# Patient Record
Sex: Male | Born: 1940 | Race: Black or African American | Hispanic: No | Marital: Married | Smoking: Former smoker
Health system: Southern US, Community
[De-identification: ages and names within clinical notes are randomized; demographics above are authoritative.]

## PROBLEM LIST (undated history)

## (undated) DIAGNOSIS — J302 Other seasonal allergic rhinitis: Secondary | ICD-10-CM

## (undated) DIAGNOSIS — E119 Type 2 diabetes mellitus without complications: Secondary | ICD-10-CM

## (undated) DIAGNOSIS — K219 Gastro-esophageal reflux disease without esophagitis: Secondary | ICD-10-CM

## (undated) DIAGNOSIS — J449 Chronic obstructive pulmonary disease, unspecified: Secondary | ICD-10-CM

## (undated) DIAGNOSIS — E7800 Pure hypercholesterolemia, unspecified: Secondary | ICD-10-CM

## (undated) DIAGNOSIS — I10 Essential (primary) hypertension: Secondary | ICD-10-CM

## (undated) DIAGNOSIS — B1920 Unspecified viral hepatitis C without hepatic coma: Secondary | ICD-10-CM

## (undated) DIAGNOSIS — C801 Malignant (primary) neoplasm, unspecified: Secondary | ICD-10-CM

## (undated) HISTORY — DX: Other seasonal allergic rhinitis: J30.2

## (undated) HISTORY — DX: Chronic obstructive pulmonary disease, unspecified: J44.9

## (undated) HISTORY — PX: COLONOSCOPY: SHX174

## (undated) HISTORY — DX: Type 2 diabetes mellitus without complications: E11.9

## (undated) HISTORY — DX: Pure hypercholesterolemia, unspecified: E78.00

## (undated) HISTORY — DX: Gastro-esophageal reflux disease without esophagitis: K21.9

## (undated) HISTORY — DX: Unspecified viral hepatitis C without hepatic coma: B19.20

## (undated) HISTORY — DX: Essential (primary) hypertension: I10

## (undated) HISTORY — PX: TONSILLECTOMY: SUR1361

---

## 2014-06-22 DIAGNOSIS — I10 Essential (primary) hypertension: Secondary | ICD-10-CM | POA: Insufficient documentation

## 2014-06-22 DIAGNOSIS — E119 Type 2 diabetes mellitus without complications: Secondary | ICD-10-CM | POA: Insufficient documentation

## 2014-06-22 DIAGNOSIS — N401 Enlarged prostate with lower urinary tract symptoms: Secondary | ICD-10-CM | POA: Insufficient documentation

## 2014-06-22 DIAGNOSIS — E7800 Pure hypercholesterolemia, unspecified: Secondary | ICD-10-CM | POA: Insufficient documentation

## 2014-06-22 DIAGNOSIS — K219 Gastro-esophageal reflux disease without esophagitis: Secondary | ICD-10-CM | POA: Insufficient documentation

## 2016-10-20 ENCOUNTER — Telehealth: Payer: Self-pay | Admitting: *Deleted

## 2016-10-20 DIAGNOSIS — Z87891 Personal history of nicotine dependence: Secondary | ICD-10-CM

## 2016-10-20 DIAGNOSIS — Z122 Encounter for screening for malignant neoplasm of respiratory organs: Secondary | ICD-10-CM

## 2016-10-20 NOTE — Telephone Encounter (Signed)
Received referral for initial lung cancer screening scan. Contacted patient and obtained smoking history,(former, quit 2008, 52 pack year) as well as answering questions related to screening process. Patient denies signs of lung cancer such as weight loss or hemoptysis. Patient denies comorbidity that would prevent curative treatment if lung cancer were found. Patient is scheduled for shared decision making visit and CT scan on 11/04/16.

## 2016-10-26 ENCOUNTER — Other Ambulatory Visit: Payer: Self-pay | Admitting: Family Medicine

## 2016-10-26 ENCOUNTER — Ambulatory Visit
Admission: RE | Admit: 2016-10-26 | Discharge: 2016-10-26 | Disposition: A | Discharge status: Home / Self Care | Payer: Medicare Other | Source: Ambulatory Visit | Attending: Family Medicine | Admitting: Family Medicine

## 2016-10-26 DIAGNOSIS — E119 Type 2 diabetes mellitus without complications: Secondary | ICD-10-CM | POA: Insufficient documentation

## 2016-10-26 DIAGNOSIS — R05 Cough: Secondary | ICD-10-CM | POA: Insufficient documentation

## 2016-10-26 DIAGNOSIS — Z87891 Personal history of nicotine dependence: Secondary | ICD-10-CM | POA: Insufficient documentation

## 2016-10-26 DIAGNOSIS — I700 Atherosclerosis of aorta: Secondary | ICD-10-CM | POA: Insufficient documentation

## 2016-10-26 DIAGNOSIS — R059 Cough, unspecified: Secondary | ICD-10-CM

## 2016-10-28 ENCOUNTER — Other Ambulatory Visit: Payer: Self-pay | Admitting: Family Medicine

## 2016-10-28 DIAGNOSIS — Z87891 Personal history of nicotine dependence: Secondary | ICD-10-CM

## 2016-10-28 DIAGNOSIS — R9389 Abnormal findings on diagnostic imaging of other specified body structures: Secondary | ICD-10-CM

## 2016-10-29 ENCOUNTER — Encounter: Payer: Self-pay | Admitting: *Deleted

## 2016-10-29 ENCOUNTER — Other Ambulatory Visit: Payer: Self-pay | Admitting: Family Medicine

## 2016-10-29 ENCOUNTER — Ambulatory Visit
Admission: RE | Admit: 2016-10-29 | Discharge: 2016-10-29 | Disposition: A | Discharge status: Home / Self Care | Payer: Medicare Other | Source: Ambulatory Visit | Attending: Family Medicine | Admitting: Family Medicine

## 2016-10-29 DIAGNOSIS — J432 Centrilobular emphysema: Secondary | ICD-10-CM | POA: Insufficient documentation

## 2016-10-29 DIAGNOSIS — R591 Generalized enlarged lymph nodes: Secondary | ICD-10-CM | POA: Insufficient documentation

## 2016-10-29 DIAGNOSIS — Z87891 Personal history of nicotine dependence: Secondary | ICD-10-CM

## 2016-10-29 DIAGNOSIS — I2510 Atherosclerotic heart disease of native coronary artery without angina pectoris: Secondary | ICD-10-CM | POA: Insufficient documentation

## 2016-10-29 DIAGNOSIS — I700 Atherosclerosis of aorta: Secondary | ICD-10-CM | POA: Diagnosis not present

## 2016-10-29 DIAGNOSIS — J479 Bronchiectasis, uncomplicated: Secondary | ICD-10-CM | POA: Diagnosis not present

## 2016-10-29 DIAGNOSIS — R918 Other nonspecific abnormal finding of lung field: Secondary | ICD-10-CM | POA: Diagnosis not present

## 2016-10-29 DIAGNOSIS — R9389 Abnormal findings on diagnostic imaging of other specified body structures: Secondary | ICD-10-CM

## 2016-10-29 NOTE — Progress Notes (Signed)
  Oncology Nurse Navigator Documentation  Navigator Location: [facility] (10/29/16 1600) Referral date to RadOnc/MedOnc: 10/28/16 (10/29/16 1600) )Navigator Encounter Type: Introductory phone call (10/29/16 1600)   Abnormal Finding Date: 10/26/16 (10/29/16 1600)                     Barriers/Navigation Needs: Coordination of Care (10/29/16 1600)   Interventions: Referrals;Coordination of Care (10/29/16 1600)   Coordination of Care: Appts;Broncoscopy;Radiology (10/29/16 1600)        Acuity: Level 2 (10/29/16 1600)   Acuity Level 2: Initial guidance, education and coordination as needed;Educational needs;Assistance expediting appointments (10/29/16 1600)      Phone call made to pt to introduce to navigator services. Reviewed upcoming appts with Dr. Jacinto Reap on 8/24 at 10 and Dr. Alva Garnet on 8/24 at 1:30pm. All questions answered during phone call. Pt states that has small vacation planned this weekend to go to [place] and would like appts for next week be scheduled on Wednesday or later if possible. Informed pt that can coordinate appts around the time he will be out of town. Pt verbalized understanding and confirmed appts for tomorrow.  Time Spent with Patient: 15 (10/29/16 1600)

## 2016-10-30 ENCOUNTER — Inpatient Hospital Stay: Payer: Medicare Other

## 2016-10-30 ENCOUNTER — Encounter: Payer: Self-pay | Admitting: Internal Medicine

## 2016-10-30 ENCOUNTER — Encounter: Payer: Self-pay | Admitting: Pulmonary Disease

## 2016-10-30 ENCOUNTER — Ambulatory Visit (INDEPENDENT_AMBULATORY_CARE_PROVIDER_SITE_OTHER): Payer: Medicare Other | Admitting: Pulmonary Disease

## 2016-10-30 ENCOUNTER — Encounter: Payer: Self-pay | Admitting: *Deleted

## 2016-10-30 ENCOUNTER — Inpatient Hospital Stay: Payer: Medicare Other | Attending: Internal Medicine | Admitting: Internal Medicine

## 2016-10-30 VITALS — BP 132/72 | HR 80 | Resp 16 | Ht 74.4 in | Wt 198.0 lb

## 2016-10-30 DIAGNOSIS — E7800 Pure hypercholesterolemia, unspecified: Secondary | ICD-10-CM | POA: Insufficient documentation

## 2016-10-30 DIAGNOSIS — R590 Localized enlarged lymph nodes: Secondary | ICD-10-CM | POA: Insufficient documentation

## 2016-10-30 DIAGNOSIS — Z7984 Long term (current) use of oral hypoglycemic drugs: Secondary | ICD-10-CM | POA: Insufficient documentation

## 2016-10-30 DIAGNOSIS — Z880 Allergy status to penicillin: Secondary | ICD-10-CM

## 2016-10-30 DIAGNOSIS — C3412 Malignant neoplasm of upper lobe, left bronchus or lung: Secondary | ICD-10-CM

## 2016-10-30 DIAGNOSIS — Z79899 Other long term (current) drug therapy: Secondary | ICD-10-CM

## 2016-10-30 DIAGNOSIS — R918 Other nonspecific abnormal finding of lung field: Secondary | ICD-10-CM

## 2016-10-30 DIAGNOSIS — Z800 Family history of malignant neoplasm of digestive organs: Secondary | ICD-10-CM | POA: Diagnosis not present

## 2016-10-30 DIAGNOSIS — R059 Cough, unspecified: Secondary | ICD-10-CM

## 2016-10-30 DIAGNOSIS — J984 Other disorders of lung: Secondary | ICD-10-CM

## 2016-10-30 DIAGNOSIS — I10 Essential (primary) hypertension: Secondary | ICD-10-CM | POA: Diagnosis not present

## 2016-10-30 DIAGNOSIS — Z7982 Long term (current) use of aspirin: Secondary | ICD-10-CM | POA: Diagnosis not present

## 2016-10-30 DIAGNOSIS — Z87891 Personal history of nicotine dependence: Secondary | ICD-10-CM | POA: Insufficient documentation

## 2016-10-30 DIAGNOSIS — R634 Abnormal weight loss: Secondary | ICD-10-CM | POA: Diagnosis not present

## 2016-10-30 DIAGNOSIS — I700 Atherosclerosis of aorta: Secondary | ICD-10-CM | POA: Diagnosis not present

## 2016-10-30 DIAGNOSIS — Z808 Family history of malignant neoplasm of other organs or systems: Secondary | ICD-10-CM | POA: Insufficient documentation

## 2016-10-30 DIAGNOSIS — K219 Gastro-esophageal reflux disease without esophagitis: Secondary | ICD-10-CM | POA: Diagnosis not present

## 2016-10-30 DIAGNOSIS — Z8041 Family history of malignant neoplasm of ovary: Secondary | ICD-10-CM | POA: Diagnosis not present

## 2016-10-30 DIAGNOSIS — E119 Type 2 diabetes mellitus without complications: Secondary | ICD-10-CM | POA: Insufficient documentation

## 2016-10-30 DIAGNOSIS — J440 Chronic obstructive pulmonary disease with acute lower respiratory infection: Secondary | ICD-10-CM | POA: Insufficient documentation

## 2016-10-30 DIAGNOSIS — Z801 Family history of malignant neoplasm of trachea, bronchus and lung: Secondary | ICD-10-CM | POA: Insufficient documentation

## 2016-10-30 DIAGNOSIS — B1920 Unspecified viral hepatitis C without hepatic coma: Secondary | ICD-10-CM | POA: Insufficient documentation

## 2016-10-30 DIAGNOSIS — R05 Cough: Secondary | ICD-10-CM | POA: Diagnosis not present

## 2016-10-30 LAB — COMPREHENSIVE METABOLIC PANEL
ALBUMIN: 3.3 g/dL — AB (ref 3.5–5.0)
ALK PHOS: 68 U/L (ref 38–126)
ALT: 8 U/L — AB (ref 17–63)
AST: 16 U/L (ref 15–41)
Anion gap: 10 (ref 5–15)
BUN: 32 mg/dL — ABNORMAL HIGH (ref 6–20)
CHLORIDE: 102 mmol/L (ref 101–111)
CO2: 24 mmol/L (ref 22–32)
CREATININE: 1.76 mg/dL — AB (ref 0.61–1.24)
Calcium: 9.4 mg/dL (ref 8.9–10.3)
GFR calc non Af Amer: 36 mL/min — ABNORMAL LOW (ref >60)
GFR, EST AFRICAN AMERICAN: 42 mL/min — AB (ref >60)
GLUCOSE: 151 mg/dL — AB (ref 65–99)
Potassium: 4.7 mmol/L (ref 3.5–5.1)
SODIUM: 136 mmol/L (ref 135–145)
Total Bilirubin: 0.3 mg/dL (ref 0.3–1.2)
Total Protein: 8 g/dL (ref 6.5–8.1)

## 2016-10-30 LAB — CBC WITH DIFFERENTIAL/PLATELET
BASOS ABS: 0 10*3/uL (ref 0–0.1)
BASOS PCT: 0 %
EOS ABS: 0.3 10*3/uL (ref 0–0.7)
EOS PCT: 5 %
HCT: 32.3 % — ABNORMAL LOW (ref 40.0–52.0)
HEMOGLOBIN: 10.8 g/dL — AB (ref 13.0–18.0)
Lymphocytes Relative: 23 %
Lymphs Abs: 1.7 10*3/uL (ref 1.0–3.6)
MCH: 28.7 pg (ref 26.0–34.0)
MCHC: 33.5 g/dL (ref 32.0–36.0)
MCV: 85.9 fL (ref 80.0–100.0)
Monocytes Absolute: 0.9 10*3/uL (ref 0.2–1.0)
Monocytes Relative: 12 %
NEUTROS PCT: 60 %
Neutro Abs: 4.7 10*3/uL (ref 1.4–6.5)
PLATELETS: 507 10*3/uL — AB (ref 150–440)
RBC: 3.76 MIL/uL — AB (ref 4.40–5.90)
RDW: 14.8 % — ABNORMAL HIGH (ref 11.5–14.5)
WBC: 7.7 10*3/uL (ref 3.8–10.6)

## 2016-10-30 LAB — PROTIME-INR
INR: 0.92
PROTHROMBIN TIME: 12.3 s (ref 11.4–15.2)

## 2016-10-30 LAB — APTT: APTT: 32 s (ref 24–36)

## 2016-10-30 LAB — LACTATE DEHYDROGENASE: LDH: 152 U/L (ref 98–192)

## 2016-10-30 NOTE — Progress Notes (Signed)
  Oncology Nurse Navigator Documentation  Navigator Location: [facility] (10/30/16 1200)   )Navigator Encounter Type: Clinic/MDC (10/30/16 1200)               Multidisiplinary Clinic Date: 10/30/16 (10/30/16 1200) Multidisiplinary Clinic Type: Thoracic (10/30/16 1200)     Treatment Phase: Abnormal Scans (10/30/16 1200) Barriers/Navigation Needs: Coordination of Care (10/30/16 1200)   Interventions: Referrals;Coordination of Care (10/30/16 1200)   Coordination of Care: Appts;Broncoscopy;Radiology (10/30/16 1200)        Acuity: Level 2 (10/30/16 1200)   Acuity Level 2: Initial guidance, education and coordination as needed;Educational needs;Assistance expediting appointments (10/30/16 1200)  met patient during thoracic multidisciplinary clinic. All questions answered at the time of visit. Reviewed upcoming appts with pt and spouse. Pt scheduled to see Dr. Alva Garnet this afternoon to discuss bronchoscopy. Pt and wife verbalized understanding of time and location of that appt. Pt's spouse verbalized anger towards probable diagnosis and wanted to talk with a counselor. Info given to contact [facility] that provides counseling for cancer patients. Contact info given and instructed to call with further questions. Pt and spouse verbalized understanding.   Time Spent with Patient: 60 (10/30/16 1200)

## 2016-10-30 NOTE — Progress Notes (Signed)
[facility] NOTE  Patient Care Team: Marguerita Merles, MD as PCP - General (Family Medicine) Telford Nab, RN as Registered Nurse  CHIEF COMPLAINTS/PURPOSE OF CONSULTATION: LEFT LUNG MASS  # AUG 2018 LEFT UPPER LUNG MASS- ~5-5.5cm; positive for M-LN. Await PET scan.   # Hep C [? Immunization in [employer]; s/p Harvoni at [place]; 2017]; CKD Stage III   No history exists.   HISTORY OF PRESENTING ILLNESS:  Edwin Jones 76 y.o.  male history of smoking quit many years ago noted to have progressive shortness of breath and cough. This led to chest x-ray that was abnormal; which further led to a chest CT scan- that showed a mass in the left upper lobe. He has been referred to [place] for further evaluation and recommendations.  Patient denies any hemoptysis. Denies any headaches. He has lost about 10 pounds since last 1 year. Otherwise his appetite is fair. Denies any falls. Denies any bone pain. Has chronic arthritic pain.   ROS: A complete 10 point review of system is done which is negative except mentioned above in history of present illness  MEDICAL HISTORY:  Past Medical History:  Diagnosis Date  . COPD (chronic obstructive pulmonary disease) ([place])   . Diabetes mellitus without complication ([place])   . GERD without esophagitis   . Hepatitis C virus    history of hep C treated with Harvoni  . Hypercholesterolemia   . Hypertension   . Seasonal allergies     SURGICAL HISTORY: Past Surgical History:  Procedure Laterality Date  . COLONOSCOPY     approximately 11 years ago  . TONSILLECTOMY      SOCIAL HISTORY: Social History   Social History  . Marital status: Married    Spouse name: N/A  . Number of children: N/A  . Years of education: N/A   Occupational History  . Not on file.   Social History Main Topics  . Smoking status: Former Smoker    Packs/day: 1.00    Years: 50.00    Types: Cigarettes  . Smokeless tobacco: Never Used  . Alcohol use No      Comment: history of alcohol use  . Drug use: No  . Sexual activity: Yes   Other Topics Concern  . Not on file   Social History Narrative  . No narrative on file    FAMILY HISTORY: Family History  Problem Relation Age of Onset  . Skin cancer Mother   . Ovarian cancer Sister        sister was diagnosed as a teenager  . Throat cancer Brother 46       brother #1  . Lung cancer Sister 80  . Throat cancer Brother 22       brother #2  . Skin cancer Sister     ALLERGIES:  is allergic to penicillin g; shellfish-derived products; statins; erythromycin; tamsulosin; tuberculin ppd; and iodinated diagnostic agents.  MEDICATIONS:  Current Outpatient Prescriptions  Medication Sig Dispense Refill  . aspirin EC 81 MG tablet Take 1 tablet by mouth daily.    . Coenzyme Q10 100 MG capsule Take 1 capsule by mouth daily.    . Flaxseed, Linseed, (FLAXSEED OIL) 1000 MG CAPS Take 1 capsule by mouth daily.    Marland Kitchen glipiZIDE (GLUCOTROL XL) 10 MG 24 hr tablet Take 1 tablet by mouth daily.    Marland Kitchen GNP GARLIC EXTRACT PO Take 027 mg by mouth daily.    . hydrochlorothiazide (HYDRODIURIL) 12.5 MG tablet Take  12.5 mg by mouth daily.    Marland Kitchen lisinopril (PRINIVIL,ZESTRIL) 10 MG tablet Take 10 mg by mouth daily.    . metFORMIN (GLUCOPHAGE) 1000 MG tablet Take 1 tablet by mouth 2 (two) times daily with a meal.    . Misc Natural Products (APPLE CIDER VINEGAR DIET PO) Take 1 capsule by mouth daily.    . Multiple Vitamin (MULTI-VITAMINS) TABS Take 1 tablet by mouth daily.    Marland Kitchen omeprazole (PRILOSEC) 20 MG capsule Take 1 capsule by mouth daily.    . APPLE CIDER VINEGAR PO Take 25 mg by mouth daily.     No current facility-administered medications for this visit.       Marland Kitchen  PHYSICAL EXAMINATION: ECOG PERFORMANCE STATUS: 1 - Symptomatic but completely ambulatory  Vitals:   10/30/16 1045  BP: (!) 151/76  Pulse: 73  Resp: 20  Temp: 97.6 F (36.4 C)  SpO2: 98%   Filed Weights   10/30/16 1045  Weight: 141 lb (64  kg)    GENERAL: Well-nourished well-developed; Alert, no distress and comfortable.   With his wife.  EYES: no pallor or icterus OROPHARYNX: no thrush or ulceration; dentures.   NECK: supple, no masses felt LYMPH:  no palpable lymphadenopathy in the cervical, axillary or inguinal regions LUNGS: clear to auscultation and  No wheeze or crackles HEART/CVS: regular rate & rhythm and no murmurs; No lower extremity edema ABDOMEN: abdomen soft, non-tender and normal bowel sounds Musculoskeletal:no cyanosis of digits and no clubbing  PSYCH: alert & oriented x 3 with fluent speech NEURO: no focal motor/sensory deficits SKIN:  no rashes or significant lesions  LABORATORY DATA:  I have reviewed the data as listed Lab Results  Component Value Date   WBC 7.7 10/30/2016   HGB 10.8 (L) 10/30/2016   HCT 32.3 (L) 10/30/2016   MCV 85.9 10/30/2016   PLT 507 (H) 10/30/2016    Recent Labs  10/30/16 1145  NA 136  K 4.7  CL 102  CO2 24  GLUCOSE 151*  BUN 32*  CREATININE 1.76*  CALCIUM 9.4  GFRNONAA 36*  GFRAA 42*  PROT 8.0  ALBUMIN 3.3*  AST 16  ALT 8*  ALKPHOS 68  BILITOT 0.3    RADIOGRAPHIC STUDIES: I have personally reviewed the radiological images as listed and agreed with the findings in the report. Dg Chest 2 View  Result Date: 10/26/2016 CLINICAL DATA:  Nonproductive cough for the past month. Former smoker. History of diabetes. EXAM: CHEST  2 VIEW COMPARISON:  None in PACs FINDINGS: There is an abnormal mass like density in the anterior right hilar region. This measures approximately 5.5 cm in greatest dimension. The left lung is clear as is the right lung. Nipple shadows are visible bilaterally. The heart and pulmonary vascularity are normal. There is faint calcification in the wall of the aortic arch. There is no pleural effusion. The bony thorax is unremarkable. IMPRESSION: Abnormal masslike density in the anterior right hilar region highly suspicious for malignancy. Chest CT  scanning now is recommended. These results will be called to the ordering clinician or representative by the Radiologist Assistant, and communication documented in the PACS or zVision Dashboard. Electronically Signed   By: David  Martinique M.D.   On: 10/26/2016 09:27   Ct Chest Wo Contrast  Result Date: 10/29/2016 CLINICAL DATA:  Cough with abnormal chest radiograph EXAM: CT CHEST WITHOUT CONTRAST TECHNIQUE: Multidetector CT imaging of the chest was performed following the standard protocol without IV contrast. COMPARISON:  Chest radiograph  October 26, 2016 FINDINGS: Cardiovascular: There is no thoracic aortic aneurysm. Visualized great vessels appear unremarkable. There is atherosclerotic calcification in the aorta as well as foci of coronary artery calcification. Pericardium is not appreciably thickened. Mediastinum/Nodes: Thyroid appears normal. There are multiple scattered subcentimeter lymph nodes throughout the thoracic region. There is a lymph node lateral to the distal trachea near the carina measuring 2.2 x 1.2 cm. There is a pretracheal lymph node measuring 1.4 x 1.0 cm. Assessment of the hila in the absence of intravenous contrast is somewhat difficult. There is felt to be a lymph node in the left hilum anteriorly measuring 1.2 x 1.2 cm. No esophageal lesion evident. Lungs/Pleura: There is a mass arising in the anterior segment of the left upper lobe which abuts the mediastinum at the level of the main pulmonary outflow tract. This mass measures 5.6 x 5.1 x 5.3 cm. There is extension of soft tissue opacity to the level of the pleural surface on the left anteriorly. It is difficult to ascertain whether this area represents extension of tumor to the level of the anterior pleural surface on the left versus adjacent postobstructive consolidation abutting the pleura. There is patchy adjacent atelectasis and pneumonitis. No other parenchymal lung mass is evident. There is slight lower lobe bronchiectatic change  bilaterally. There is a degree of underlying centrilobular emphysematous change. There is no pleural effusion. Upper Abdomen: There is atherosclerotic calcification in the upper abdominal aorta. Adrenals appear unremarkable. Visualized upper abdominal structures otherwise appear unremarkable. Musculoskeletal: There are no blastic or lytic bone lesions. IMPRESSION: 1. Dominant mass arising in the anterior segment of the left upper lobe which abuts the mediastinum at the level of the main pulmonary outflow tract on the left. It is difficult to ascertain with a tumor versus postobstructive consolidation abuts the anterior left pleura. There may be tumor extending into this area. The mass measures 5.6 x 5.1 x 5.3 cm. It has an appearance indicative of pulmonary neoplasm. 2. Foci of adenopathy as noted above. Suspect malignant adenopathy given the dominant mass in the left upper lobe. 3. Underlying centrilobular emphysema. Mild lower lobe bronchiectatic change. 4. Foci of aortic atherosclerosis. Foci of coronary artery calcification. 5.  No adrenal lesions evident. Comment: PET-CT may be quite helpful for staging purposes. PET-CT also may be helpful to ascertain if tumor versus postobstructive pneumonitis is extending to the left anterior pleural surface. Aortic Atherosclerosis (ICD10-I70.0) and Emphysema (ICD10-J43.9). These results will be called to the ordering clinician or representative by the Radiologist Assistant, and communication documented in the PACS or zVision Dashboard. Electronically Signed   By: Lowella Grip III M.D.   On: 10/29/2016 09:24    ASSESSMENT & PLAN:   Cancer of upper lobe of left lung ([place]) # Left upper lobe lung mass approximately 5 cm in size; positive for mediastinal adenopathy. Highly suspicious for malignancy. Would recommend a PET scan for further staging purposes. Patient will need a tissue diagnosis. He is awaiting to be evaluated by pulmonary.   # Discussed the patient's  family regarding the concerns for malignancy given the CT scan findings. However we'll need biopsy for tissue diagnosis/confirmation. Discussed that the treatment options/prognosis will depend upon stage. Discussed the treatment options include in general chemoradiation/chemotherapy/immunotherapy.  # Hep C status post treatment [2017]  # Patient follow-up with me 2-3 days post biopsy/would need radiation oncology appointment/labs today.   Thank you Dr.Miles for allowing me to participate in the care of your pleasant patient. Please do not hesitate  to contact me with questions or concerns in the interim.  # I reviewed the blood work- with the patient in detail; also reviewed the imaging independently [as summarized above]; and with the patient in detail. Also reviewed at the tumor conference; discuss with [facility], nurse navigator.  All questions were answered. The patient knows to call the clinic with any problems, questions or concerns.    Cammie Sickle, MD 11/01/2016 7:19 PM

## 2016-10-30 NOTE — Patient Instructions (Signed)
We discussed the likely diagnosis of lung cancer. We discussed diagnostic options and have agreed to proceed with bronchoscopy. We will try to schedule this for Thursday morning August 30th. We have to confirm this first with the bronchoscopy scheduling personnel who are not presently available. We will contact you to confirm the scheduling of that procedure.  Office follow up will be scheduled as needed

## 2016-10-30 NOTE — Progress Notes (Signed)
Patient presents to clinic today for new consult with Dr. Tish Men. He is accompanied by his wife.  Patient states that he has had no health issues in the last year. "I am very healthy for the most part. I have a good support system. 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 He was treated with Harvoni treatments approximately 1 year ago (at the [place]). (He was in the [employer] for approximately 3 years.)  He states that since that treatment he has developed an unproductive cough.  The cough worsen approximately 2-3 weeks ago. He also reports that he has lost 14 pounds in the last 1.5 years, but he has been eating and exercising. He denies any chest pain, shortness of breath. He reports 2-3 loose stools daily over the past 2 - 3 weeks.  He reports a family h/o lung (sister, throat (2 brothers) and ovarian (sister as a teenager) and skin cancers. Pt's last colonosocpy was 11 years ago. He is scheduled to see Dr. Gwenlyn Perking in October.  Pt is a former smoker- smoked 1 pkd x 50 years. He stopped smoking several years ago. He reports h/o alcohol use in the past-no longer drinks alcohol. Stopped drinking approx. 12 years ago Patient stated that he drank a pint of alcohol on the weekends. He denies any use of any other tobacco products or ecigs.

## 2016-10-30 NOTE — Assessment & Plan Note (Addendum)
#   Left upper lobe lung mass approximately 5 cm in size; positive for mediastinal adenopathy. Highly suspicious for malignancy. Would recommend a PET scan for further staging purposes. Patient will need a tissue diagnosis. He is awaiting to be evaluated by pulmonary.   # Discussed the patient's family regarding the concerns for malignancy given the CT scan findings. However we'll need biopsy for tissue diagnosis/confirmation. Discussed that the treatment options/prognosis will depend upon stage. Discussed the treatment options include in general chemoradiation/chemotherapy/immunotherapy.  # Hep C status post treatment [2017]  # Patient follow-up with me 2-3 days post biopsy/would need radiation oncology appointment/labs today.   Thank you Dr.Miles for allowing me to participate in the care of your pleasant patient. Please do not hesitate to contact me with questions or concerns in the interim.  # I reviewed the blood work- with the patient in detail; also reviewed the imaging independently [as summarized above]; and with the patient in detail. Also reviewed at the tumor conference; discuss with [facility], nurse navigator.

## 2016-10-31 LAB — CEA: CEA1: 16.4 ng/mL — AB (ref 0.0–4.7)

## 2016-11-02 ENCOUNTER — Ambulatory Visit: Payer: Medicare Other | Admitting: Internal Medicine

## 2016-11-02 NOTE — Progress Notes (Signed)
PULMONARY CONSULT NOTE  Requesting MD/Service: Rogue Bussing Date of initial consultation: 10/30/16 Reason for consultation: Lung mass  PT PROFILE: 76 y.o. male former smoker with newly detected lung mass on chest x-ray and CT chest  HPI:  As above. He initially presented to his primary care physician with cough, weight loss, fatigue. His cough is mostly nonproductive with no hemoptysis. He denies chest pain. A chest x-ray was performed which revealed a lung mass followed by a CT scan of the chest followed by referral to oncology and subsequently to me.  He smoked 1 pack cigarettes per day for 45-50 years. He quit in 2007.  Past Medical History:  Diagnosis Date  . COPD (chronic obstructive pulmonary disease) ([place])   . Diabetes mellitus without complication ([place])   . GERD without esophagitis   . Hepatitis C virus    history of hep C treated with Harvoni  . Hypercholesterolemia   . Hypertension   . Seasonal allergies     Past Surgical History:  Procedure Laterality Date  . COLONOSCOPY     approximately 11 years ago  . TONSILLECTOMY      MEDICATIONS: I have reviewed all medications and confirmed regimen as documented  Social History   Social History  . Marital status: Married    Spouse name: N/A  . Number of children: N/A  . Years of education: N/A   Occupational History  . Not on file.   Social History Main Topics  . Smoking status: Former Smoker    Packs/day: 1.00    Years: 50.00    Types: Cigarettes  . Smokeless tobacco: Never Used  . Alcohol use No     Comment: history of alcohol use  . Drug use: No  . Sexual activity: Yes   Other Topics Concern  . Not on file   Social History Narrative  . No narrative on file    Family History  Problem Relation Age of Onset  . Skin cancer Mother   . Ovarian cancer Sister        sister was diagnosed as a teenager  . Throat cancer Brother 77       brother #1  . Lung cancer Sister 32  . Throat cancer Brother 32     brother #2  . Skin cancer Sister     ROS: No fever, myalgias/arthralgias No new focal weakness or sensory deficits No otalgia, hearing loss, visual changes, nasal and sinus symptoms, mouth and throat problems No neck pain or adenopathy No abdominal pain, N/V/D, diarrhea, change in bowel pattern No dysuria, change in urinary pattern   Vitals:   10/30/16 1327 10/30/16 1333  BP:  132/72  Pulse:  80  Resp: 16   SpO2:  97%  Weight: 198 lb (89.8 kg)   Height: 6' 2.4" (1.89 m)      EXAM:  Gen: WDWN, pleasant, NAD HEENT: NCAT, sclera white, oropharynx normal Neck: Supple without LAN, thyromegaly, JVD Lungs: breath sounds mildly diminished without wheezes or other adventitious sounds Cardiovascular: RRR, no murmurs noted Abdomen: Soft, nontender, normal BS Ext: Digital clubbing present, no edema Neuro: CNs grossly intact, motor and sensory intact Skin: Limited exam, no lesions noted  DATA:   BMP Latest Ref Rng & Units 10/30/2016  Glucose 65 - 99 mg/dL 151(H)  BUN 6 - 20 mg/dL 32(H)  Creatinine 0.61 - 1.24 mg/dL 1.76(H)  Sodium 135 - 145 mmol/L 136  Potassium 3.5 - 5.1 mmol/L 4.7  Chloride 101 - 111 mmol/L 102  CO2 22 - 32 mmol/L 24  Calcium 8.9 - 10.3 mg/dL 9.4    CBC Latest Ref Rng & Units 10/30/2016  WBC 3.8 - 10.6 K/uL 7.7  Hemoglobin 13.0 - 18.0 g/dL 10.8(L)  Hematocrit 40.0 - 52.0 % 32.3(L)  Platelets 150 - 440 K/uL 507(H)    CXR (10/26/16):  Left hilar mass CT chest (10/29/16): 6 cm lobulated/spiculated left upper lobe mass in anterior segment  IMPRESSION:     ICD-10-CM   1. Lung mass R91.8   2. Former smoker Z87.891   3. Cough R05    Lung mass is highly suspicious for malignancy. The digital clubbing would suggest a squamous cell carcinoma if this is acquired. However, the patient's wife indicates that his nailbeds have always had a clubbed appearance.  PLAN:  We discussed in detail the likely diagnosis of lung cancer and options to confirm this  suspected diagnosis. After this discussion, he agrees to proceed with bronchoscopy which will be scheduled for 11/05/16 a.m. Office follow-up will be scheduled as needed.   Merton Border, MD PCCM service Mobile [phone] Pager [phone] 11/02/2016 4:06 PM

## 2016-11-04 ENCOUNTER — Encounter: Payer: Self-pay | Admitting: Anesthesiology

## 2016-11-04 ENCOUNTER — Ambulatory Visit: Payer: Medicare Other

## 2016-11-04 ENCOUNTER — Inpatient Hospital Stay: Payer: Medicare Other | Admitting: Oncology

## 2016-11-05 ENCOUNTER — Ambulatory Visit
Admission: RE | Admit: 2016-11-05 | Discharge: 2016-11-05 | Disposition: A | Discharge status: Home / Self Care | Payer: Medicare Other | Source: Ambulatory Visit | Attending: Pulmonary Disease | Admitting: Pulmonary Disease

## 2016-11-05 ENCOUNTER — Encounter
Admission: RE | Disposition: A | Discharge status: Home / Self Care | Payer: Self-pay | Source: Ambulatory Visit | Attending: Pulmonary Disease

## 2016-11-05 ENCOUNTER — Ambulatory Visit: Payer: Medicare Other

## 2016-11-05 DIAGNOSIS — Z7984 Long term (current) use of oral hypoglycemic drugs: Secondary | ICD-10-CM | POA: Insufficient documentation

## 2016-11-05 DIAGNOSIS — C3412 Malignant neoplasm of upper lobe, left bronchus or lung: Secondary | ICD-10-CM | POA: Insufficient documentation

## 2016-11-05 DIAGNOSIS — I10 Essential (primary) hypertension: Secondary | ICD-10-CM | POA: Insufficient documentation

## 2016-11-05 DIAGNOSIS — E119 Type 2 diabetes mellitus without complications: Secondary | ICD-10-CM | POA: Insufficient documentation

## 2016-11-05 DIAGNOSIS — J302 Other seasonal allergic rhinitis: Secondary | ICD-10-CM | POA: Insufficient documentation

## 2016-11-05 DIAGNOSIS — Z7982 Long term (current) use of aspirin: Secondary | ICD-10-CM | POA: Diagnosis not present

## 2016-11-05 DIAGNOSIS — R918 Other nonspecific abnormal finding of lung field: Secondary | ICD-10-CM | POA: Diagnosis not present

## 2016-11-05 DIAGNOSIS — Z87891 Personal history of nicotine dependence: Secondary | ICD-10-CM | POA: Diagnosis not present

## 2016-11-05 DIAGNOSIS — K219 Gastro-esophageal reflux disease without esophagitis: Secondary | ICD-10-CM | POA: Diagnosis not present

## 2016-11-05 DIAGNOSIS — J449 Chronic obstructive pulmonary disease, unspecified: Secondary | ICD-10-CM | POA: Insufficient documentation

## 2016-11-05 DIAGNOSIS — Z79899 Other long term (current) drug therapy: Secondary | ICD-10-CM | POA: Insufficient documentation

## 2016-11-05 HISTORY — PX: FLEXIBLE BRONCHOSCOPY: SHX5094

## 2016-11-05 LAB — GLUCOSE, CAPILLARY
GLUCOSE-CAPILLARY: 108 mg/dL — AB (ref 65–99)
Glucose-Capillary: 86 mg/dL (ref 65–99)

## 2016-11-05 SURGERY — BRONCHOSCOPY, FLEXIBLE
Anesthesia: Moderate Sedation

## 2016-11-05 MED ORDER — MIDAZOLAM HCL 5 MG/5ML IJ SOLN
INTRAMUSCULAR | Status: AC
  Administered 2016-11-05: 2 mg via INTRAVENOUS
  Filled 2016-11-05: qty 10

## 2016-11-05 MED ORDER — FENTANYL CITRATE (PF) 100 MCG/2ML IJ SOLN
INTRAMUSCULAR | Status: AC
  Administered 2016-11-05: 09:00:00 via INTRAVENOUS
  Filled 2016-11-05: qty 4

## 2016-11-05 MED ORDER — PHENYLEPHRINE HCL 0.25 % NA SOLN
1.0000 | Freq: Four times a day (QID) | NASAL | Status: DC | PRN
  Filled 2016-11-05: qty 15

## 2016-11-05 MED ORDER — MIDAZOLAM HCL 2 MG/2ML IJ SOLN
INTRAMUSCULAR | Status: DC | PRN
  Administered 2016-11-05: 2 mg
  Administered 2016-11-05: 2 mg via INTRAVENOUS

## 2016-11-05 MED ORDER — LIDOCAINE HCL 2 % EX GEL: 1.0000 "application " | Freq: Once | CUTANEOUS | Status: DC

## 2016-11-05 MED ORDER — FENTANYL CITRATE (PF) 100 MCG/2ML IJ SOLN
25.0000 ug | INTRAMUSCULAR | Status: DC | PRN
  Administered 2016-11-05 (×3): 25 ug via INTRAVENOUS

## 2016-11-05 NOTE — Procedures (Signed)
BRONCHOSCOPY NOTE  Indication:   LUL lung mass  Premedication:   Fentanyl 50 mcg Midaz 4 mg  Anesthesia: Topical to nose and throat 50 cc cc of 1% lidocaine used during the course of procedure  Procedure: After adequate sedation and anesthesia, the bronchoscope was introduced via the R naris and advanced into the posterior pharynx. Further anesthesia was obtained with 1% lidocaine and the scope was advanced into the trachea. Complete airway anesthesia was achieved with 1% lidocaine and a thorough airway examination was performed. This revealed the following findings:  Findings:  Upper airway - erythema and abundant lymphoid tissue Tracheobronchial anatomy - normal anatomic arrangement Bronchial mucosa - mild chronic and severe acute bronchitic changes Other - no endobronchial masses, tumors or foreign bodies  Specimens:   BAL from anterior segment of LUL Cytology brushings from anterior segment of LUL Transbronchial biopsy (with flouroscopic guidance) from anterior segment of LUL   Complications: Moderate bleeding which resolved spontaneously by the time of completion of the procedure  Post procedure evaluation:  The patient tolerated the procedure well with no major complications Thoracic US revealed sliding lung sign (i.e. No pneumothorax)   Merton Border, MD PCCM service Mobile [phone] Pager [phone]  11/05/2016

## 2016-11-05 NOTE — Sedation Documentation (Signed)
Bronch Total sedation: Versed 4 mg IV, Fentanyl 50 mcg IV. Fluoro time: 1.1 min. Total.  Pt. Tolerated procedure well.

## 2016-11-05 NOTE — H&P (View-Only) (Signed)
PULMONARY CONSULT NOTE  Requesting MD/Service: Rogue Bussing Date of initial consultation: 10/30/16 Reason for consultation: Lung mass  PT PROFILE: 76 y.o. male former smoker with newly detected lung mass on chest x-ray and CT chest  HPI:  As above. He initially presented to his primary care physician with cough, weight loss, fatigue. His cough is mostly nonproductive with no hemoptysis. He denies chest pain. A chest x-ray was performed which revealed a lung mass followed by a CT scan of the chest followed by referral to oncology and subsequently to me.  He smoked 1 pack cigarettes per day for 45-50 years. He quit in 2007.  Past Medical History:  Diagnosis Date  . COPD (chronic obstructive pulmonary disease) ([place])   . Diabetes mellitus without complication ([place])   . GERD without esophagitis   . Hepatitis C virus    history of hep C treated with Harvoni  . Hypercholesterolemia   . Hypertension   . Seasonal allergies     Past Surgical History:  Procedure Laterality Date  . COLONOSCOPY     approximately 11 years ago  . TONSILLECTOMY      MEDICATIONS: I have reviewed all medications and confirmed regimen as documented  Social History   Social History  . Marital status: Married    Spouse name: N/A  . Number of children: N/A  . Years of education: N/A   Occupational History  . Not on file.   Social History Main Topics  . Smoking status: Former Smoker    Packs/day: 1.00    Years: 50.00    Types: Cigarettes  . Smokeless tobacco: Never Used  . Alcohol use No     Comment: history of alcohol use  . Drug use: No  . Sexual activity: Yes   Other Topics Concern  . Not on file   Social History Narrative  . No narrative on file    Family History  Problem Relation Age of Onset  . Skin cancer Mother   . Ovarian cancer Sister        sister was diagnosed as a teenager  . Throat cancer Brother 87       brother #1  . Lung cancer Sister 45  . Throat cancer Brother 56     brother #2  . Skin cancer Sister     ROS: No fever, myalgias/arthralgias No new focal weakness or sensory deficits No otalgia, hearing loss, visual changes, nasal and sinus symptoms, mouth and throat problems No neck pain or adenopathy No abdominal pain, N/V/D, diarrhea, change in bowel pattern No dysuria, change in urinary pattern   Vitals:   10/30/16 1327 10/30/16 1333  BP:  132/72  Pulse:  80  Resp: 16   SpO2:  97%  Weight: 198 lb (89.8 kg)   Height: 6' 2.4" (1.89 m)      EXAM:  Gen: WDWN, pleasant, NAD HEENT: NCAT, sclera white, oropharynx normal Neck: Supple without LAN, thyromegaly, JVD Lungs: breath sounds mildly diminished without wheezes or other adventitious sounds Cardiovascular: RRR, no murmurs noted Abdomen: Soft, nontender, normal BS Ext: Digital clubbing present, no edema Neuro: CNs grossly intact, motor and sensory intact Skin: Limited exam, no lesions noted  DATA:   BMP Latest Ref Rng & Units 10/30/2016  Glucose 65 - 99 mg/dL 151(H)  BUN 6 - 20 mg/dL 32(H)  Creatinine 0.61 - 1.24 mg/dL 1.76(H)  Sodium 135 - 145 mmol/L 136  Potassium 3.5 - 5.1 mmol/L 4.7  Chloride 101 - 111 mmol/L 102  CO2 22 - 32 mmol/L 24  Calcium 8.9 - 10.3 mg/dL 9.4    CBC Latest Ref Rng & Units 10/30/2016  WBC 3.8 - 10.6 K/uL 7.7  Hemoglobin 13.0 - 18.0 g/dL 10.8(L)  Hematocrit 40.0 - 52.0 % 32.3(L)  Platelets 150 - 440 K/uL 507(H)    CXR (10/26/16):  Left hilar mass CT chest (10/29/16): 6 cm lobulated/spiculated left upper lobe mass in anterior segment  IMPRESSION:     ICD-10-CM   1. Lung mass R91.8   2. Former smoker Z87.891   3. Cough R05    Lung mass is highly suspicious for malignancy. The digital clubbing would suggest a squamous cell carcinoma if this is acquired. However, the patient's wife indicates that his nailbeds have always had a clubbed appearance.  PLAN:  We discussed in detail the likely diagnosis of lung cancer and options to confirm this  suspected diagnosis. After this discussion, he agrees to proceed with bronchoscopy which will be scheduled for 11/05/16 a.m. Office follow-up will be scheduled as needed.   Merton Border, MD PCCM service Mobile [phone] Pager [phone] 11/02/2016 4:06 PM

## 2016-11-05 NOTE — Progress Notes (Signed)
Called and spoke with MD re: DC orders. MD states "just have pt. Advance his diet when he gets home . He is to see his primary MD next week. " Pt. And spouse informed of MD orders and verbalized understanding.

## 2016-11-05 NOTE — Interval H&P Note (Signed)
History and Physical Interval Note:  11/05/2016 8:16 AM  Edwin Jones  has presented today for surgery, with the diagnosis of Regular Bronch  Lung Mass Received fax w request for [place]  Email sent to [place] [facility] for [facility]:bd  The various methods of treatment have been discussed with the patient and family. After consideration of risks, benefits and other options for treatment, the patient has consented to  Procedure(s): FLEXIBLE BRONCHOSCOPY (N/A) as a surgical intervention .  The patient's history has been reviewed, patient examined, no change in status, stable for surgery.  I have reviewed the patient's chart and labs.  Questions were answered to the patient's satisfaction.     Merton Border, MD PCCM service Mobile [phone] Pager [phone] 11/05/2016 8:16 AM

## 2016-11-06 ENCOUNTER — Ambulatory Visit
Admission: RE | Admit: 2016-11-06 | Discharge: 2016-11-06 | Disposition: A | Discharge status: Home / Self Care | Payer: Medicare Other | Source: Ambulatory Visit | Attending: Internal Medicine | Admitting: Internal Medicine

## 2016-11-06 DIAGNOSIS — J984 Other disorders of lung: Secondary | ICD-10-CM

## 2016-11-06 DIAGNOSIS — C3412 Malignant neoplasm of upper lobe, left bronchus or lung: Secondary | ICD-10-CM

## 2016-11-06 DIAGNOSIS — R918 Other nonspecific abnormal finding of lung field: Secondary | ICD-10-CM | POA: Diagnosis present

## 2016-11-06 DIAGNOSIS — N329 Bladder disorder, unspecified: Secondary | ICD-10-CM | POA: Insufficient documentation

## 2016-11-06 DIAGNOSIS — I700 Atherosclerosis of aorta: Secondary | ICD-10-CM | POA: Insufficient documentation

## 2016-11-06 LAB — GLUCOSE, CAPILLARY: GLUCOSE-CAPILLARY: 133 mg/dL — AB (ref 65–99)

## 2016-11-06 MED ORDER — FLUDEOXYGLUCOSE F - 18 (FDG) INJECTION
12.2800 | Freq: Once | INTRAVENOUS | Status: AC | PRN
  Administered 2016-11-06: 12.28 via INTRAVENOUS

## 2016-11-13 ENCOUNTER — Inpatient Hospital Stay: Payer: Medicare Other | Attending: Internal Medicine | Admitting: Internal Medicine

## 2016-11-13 ENCOUNTER — Other Ambulatory Visit: Payer: Self-pay | Admitting: *Deleted

## 2016-11-13 ENCOUNTER — Other Ambulatory Visit: Payer: Self-pay | Admitting: Pathology

## 2016-11-13 ENCOUNTER — Telehealth: Payer: Self-pay | Admitting: Internal Medicine

## 2016-11-13 ENCOUNTER — Encounter: Payer: Self-pay | Admitting: *Deleted

## 2016-11-13 VITALS — BP 134/72 | HR 80 | Temp 97.4°F | Resp 20 | Ht 74.0 in | Wt 198.2 lb

## 2016-11-13 DIAGNOSIS — N183 Chronic kidney disease, stage 3 (moderate): Secondary | ICD-10-CM | POA: Insufficient documentation

## 2016-11-13 DIAGNOSIS — G8929 Other chronic pain: Secondary | ICD-10-CM | POA: Diagnosis not present

## 2016-11-13 DIAGNOSIS — I700 Atherosclerosis of aorta: Secondary | ICD-10-CM | POA: Diagnosis not present

## 2016-11-13 DIAGNOSIS — C3412 Malignant neoplasm of upper lobe, left bronchus or lung: Secondary | ICD-10-CM | POA: Insufficient documentation

## 2016-11-13 DIAGNOSIS — Z5111 Encounter for antineoplastic chemotherapy: Secondary | ICD-10-CM | POA: Diagnosis not present

## 2016-11-13 DIAGNOSIS — Z808 Family history of malignant neoplasm of other organs or systems: Secondary | ICD-10-CM | POA: Insufficient documentation

## 2016-11-13 DIAGNOSIS — R590 Localized enlarged lymph nodes: Secondary | ICD-10-CM

## 2016-11-13 DIAGNOSIS — Z801 Family history of malignant neoplasm of trachea, bronchus and lung: Secondary | ICD-10-CM | POA: Diagnosis not present

## 2016-11-13 DIAGNOSIS — I129 Hypertensive chronic kidney disease with stage 1 through stage 4 chronic kidney disease, or unspecified chronic kidney disease: Secondary | ICD-10-CM | POA: Diagnosis not present

## 2016-11-13 DIAGNOSIS — E1122 Type 2 diabetes mellitus with diabetic chronic kidney disease: Secondary | ICD-10-CM | POA: Diagnosis not present

## 2016-11-13 DIAGNOSIS — Z800 Family history of malignant neoplasm of digestive organs: Secondary | ICD-10-CM | POA: Insufficient documentation

## 2016-11-13 DIAGNOSIS — Z7984 Long term (current) use of oral hypoglycemic drugs: Secondary | ICD-10-CM | POA: Insufficient documentation

## 2016-11-13 DIAGNOSIS — E7800 Pure hypercholesterolemia, unspecified: Secondary | ICD-10-CM | POA: Insufficient documentation

## 2016-11-13 DIAGNOSIS — Z7982 Long term (current) use of aspirin: Secondary | ICD-10-CM | POA: Diagnosis not present

## 2016-11-13 DIAGNOSIS — Z880 Allergy status to penicillin: Secondary | ICD-10-CM | POA: Diagnosis not present

## 2016-11-13 DIAGNOSIS — J449 Chronic obstructive pulmonary disease, unspecified: Secondary | ICD-10-CM | POA: Diagnosis not present

## 2016-11-13 DIAGNOSIS — B1920 Unspecified viral hepatitis C without hepatic coma: Secondary | ICD-10-CM | POA: Insufficient documentation

## 2016-11-13 DIAGNOSIS — Z87891 Personal history of nicotine dependence: Secondary | ICD-10-CM | POA: Insufficient documentation

## 2016-11-13 DIAGNOSIS — M1990 Unspecified osteoarthritis, unspecified site: Secondary | ICD-10-CM | POA: Insufficient documentation

## 2016-11-13 DIAGNOSIS — K219 Gastro-esophageal reflux disease without esophagitis: Secondary | ICD-10-CM

## 2016-11-13 DIAGNOSIS — R630 Anorexia: Secondary | ICD-10-CM | POA: Insufficient documentation

## 2016-11-13 DIAGNOSIS — R634 Abnormal weight loss: Secondary | ICD-10-CM | POA: Diagnosis not present

## 2016-11-13 DIAGNOSIS — Z8041 Family history of malignant neoplasm of ovary: Secondary | ICD-10-CM | POA: Insufficient documentation

## 2016-11-13 DIAGNOSIS — Z79899 Other long term (current) drug therapy: Secondary | ICD-10-CM | POA: Insufficient documentation

## 2016-11-13 LAB — CYTOLOGY - NON PAP

## 2016-11-13 LAB — SURGICAL PATHOLOGY

## 2016-11-13 NOTE — Progress Notes (Signed)
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 PET 2018- no distant mets.       # Sep 2018-MRI brain-NEG  # Hep C [? Immunization in [employer]; s/p Harvoni at [place]; 2017]; CKD Stage III     Cancer of upper lobe of left lung (HCC)   HISTORY OF PRESENTING ILLNESS:  Edwin Jones 76 y.o.  male  long-standing history of smoking- lung mass; status post bronchoscopy and biopsy is here to discuss the results of his pathology.  Patient denies any hemoptysis. Denies any headaches.Otherwise his appetite is fair. Denies any falls. Denies any bone pain. Has chronic arthritic pain.   ROS: A complete 10 point review of system is done which is negative except mentioned above in history of present illness  MEDICAL HISTORY:  Past Medical History:  Diagnosis Date  . COPD (chronic obstructive pulmonary disease) ([place])   . Diabetes mellitus without complication ([place])   . GERD without esophagitis   . Hepatitis C virus    history of hep C treated with Harvoni  . Hypercholesterolemia   . Hypertension   . Seasonal allergies     SURGICAL HISTORY: Past Surgical History:  Procedure Laterality Date  . COLONOSCOPY     approximately 11 years ago  . FLEXIBLE BRONCHOSCOPY N/A 11/05/2016   Procedure: FLEXIBLE BRONCHOSCOPY;  Surgeon: Wilhelmina Mcardle, MD;  Location: [facility] ORS;  Service: Pulmonary;  Laterality: N/A;  . TONSILLECTOMY      SOCIAL HISTORY: Social History   Social History  . Marital status: Married    Spouse name: N/A  . Number of children: N/A  . Years of education: N/A   Occupational History  . Not on file.   Social History Main Topics  . Smoking status: Former Smoker    Packs/day: 1.00     Years: 50.00    Types: Cigarettes    Quit date: 04/07/2004  . Smokeless tobacco: Never Used  . Alcohol use No     Comment: history of alcohol use  . Drug use: No  . Sexual activity: Yes   Other Topics Concern  . Not on file   Social History Narrative  . No narrative on file    FAMILY HISTORY: Family History  Problem Relation Age of Onset  . Skin cancer Mother   . Ovarian cancer Sister        sister was diagnosed as a teenager  . Throat cancer Brother 71       brother #1  . Lung cancer Sister 80  . Throat cancer Brother 9       brother #2  . Skin cancer Sister     ALLERGIES:  is allergic to penicillin g; shellfish-derived products; statins; erythromycin; tamsulosin; tuberculin ppd; and iodinated diagnostic agents.  MEDICATIONS:  Current Outpatient Prescriptions  Medication Sig Dispense Refill  . APPLE CIDER VINEGAR PO Take 450 mg by mouth at bedtime.     Marland Kitchen aspirin EC 81 MG tablet Take 81 mg by mouth at bedtime.     . Coenzyme Q10 100 MG capsule Take 100 mg by mouth daily.     . Flaxseed, Linseed, (FLAXSEED OIL) 1000 MG CAPS Take 1,000 mg by mouth  at bedtime.     . fluticasone (FLONASE) 50 MCG/ACT nasal spray Place 1 spray into both nostrils daily as needed for allergies or rhinitis.    Marland Kitchen GNP GARLIC EXTRACT PO Take 1,610 mg by mouth daily.     Marland Kitchen lisinopril (PRINIVIL,ZESTRIL) 10 MG tablet Take 10 mg by mouth daily.    . metFORMIN (GLUCOPHAGE) 1000 MG tablet Take 1,000 mg by mouth 2 (two) times daily with a meal.     . omeprazole (PRILOSEC) 20 MG capsule Take 20 mg by mouth every Monday, Wednesday, and Friday.     . cetirizine (ZYRTEC) 10 MG tablet Take 10 mg by mouth daily.    . cholecalciferol (VITAMIN D) 1000 units tablet Take 1,000 Units by mouth daily.    Marland Kitchen glipiZIDE (GLUCOTROL) 5 MG tablet Take 5 mg by mouth 2 (two) times daily.    . hydrochlorothiazide (HYDRODIURIL) 25 MG tablet Take 25 mg by mouth daily.    . Multiple Vitamin (MULTIVITAMIN WITH MINERALS) TABS  tablet Take 1 tablet by mouth daily.     No current facility-administered medications for this visit.       Marland Kitchen  PHYSICAL EXAMINATION: ECOG PERFORMANCE STATUS: 1 - Symptomatic but completely ambulatory  Vitals:   11/13/16 0836  BP: 134/72  Pulse: 80  Resp: 20  Temp: (!) 97.4 F (36.3 C)   Filed Weights   11/13/16 0836  Weight: 198 lb 3.2 oz (89.9 kg)    GENERAL: Well-nourished well-developed; Alert, no distress and comfortable.   With his wife.  EYES: no pallor or icterus OROPHARYNX: no thrush or ulceration; dentures.   NECK: supple, no masses felt LYMPH:  no palpable lymphadenopathy in the cervical, axillary or inguinal regions LUNGS: clear to auscultation and  No wheeze or crackles HEART/CVS: regular rate & rhythm and no murmurs; No lower extremity edema ABDOMEN: abdomen soft, non-tender and normal bowel sounds Musculoskeletal:no cyanosis of digits and no clubbing  PSYCH: alert & oriented x 3 with fluent speech NEURO: no focal motor/sensory deficits SKIN:  no rashes or significant lesions  LABORATORY DATA:  I have reviewed the data as listed Lab Results  Component Value Date   WBC 7.7 10/30/2016   HGB 10.8 (L) 10/30/2016   HCT 32.3 (L) 10/30/2016   MCV 85.9 10/30/2016   PLT 507 (H) 10/30/2016    Recent Labs  10/30/16 1145  NA 136  K 4.7  CL 102  CO2 24  GLUCOSE 151*  BUN 32*  CREATININE 1.76*  CALCIUM 9.4  GFRNONAA 36*  GFRAA 42*  PROT 8.0  ALBUMIN 3.3*  AST 16  ALT 8*  ALKPHOS 68  BILITOT 0.3    RADIOGRAPHIC STUDIES: I have personally reviewed the radiological images as listed and agreed with the findings in the report. Dg Chest 2 View  Result Date: 10/26/2016 CLINICAL DATA:  Nonproductive cough for the past month. Former smoker. History of diabetes. EXAM: CHEST  2 VIEW COMPARISON:  None in PACs FINDINGS: There is an abnormal mass like density in the anterior right hilar region. This measures approximately 5.5 cm in greatest dimension. The  left lung is clear as is the right lung. Nipple shadows are visible bilaterally. The heart and pulmonary vascularity are normal. There is faint calcification in the wall of the aortic arch. There is no pleural effusion. The bony thorax is unremarkable. IMPRESSION: Abnormal masslike density in the anterior right hilar region highly suspicious for malignancy. Chest CT scanning now is recommended. These results will be called  to the ordering clinician or representative by the Radiologist Assistant, and communication documented in the PACS or zVision Dashboard. Electronically Signed   By: David  Martinique M.D.   On: 10/26/2016 09:27   Ct Chest Wo Contrast  Result Date: 10/29/2016 CLINICAL DATA:  Cough with abnormal chest radiograph EXAM: CT CHEST WITHOUT CONTRAST TECHNIQUE: Multidetector CT imaging of the chest was performed following the standard protocol without IV contrast. COMPARISON:  Chest radiograph October 26, 2016 FINDINGS: Cardiovascular: There is no thoracic aortic aneurysm. Visualized great vessels appear unremarkable. There is atherosclerotic calcification in the aorta as well as foci of coronary artery calcification. Pericardium is not appreciably thickened. Mediastinum/Nodes: Thyroid appears normal. There are multiple scattered subcentimeter lymph nodes throughout the thoracic region. There is a lymph node lateral to the distal trachea near the carina measuring 2.2 x 1.2 cm. There is a pretracheal lymph node measuring 1.4 x 1.0 cm. Assessment of the hila in the absence of intravenous contrast is somewhat difficult. There is felt to be a lymph node in the left hilum anteriorly measuring 1.2 x 1.2 cm. No esophageal lesion evident. Lungs/Pleura: There is a mass arising in the anterior segment of the left upper lobe which abuts the mediastinum at the level of the main pulmonary outflow tract. This mass measures 5.6 x 5.1 x 5.3 cm. There is extension of soft tissue opacity to the level of the pleural surface  on the left anteriorly. It is difficult to ascertain whether this area represents extension of tumor to the level of the anterior pleural surface on the left versus adjacent postobstructive consolidation abutting the pleura. There is patchy adjacent atelectasis and pneumonitis. No other parenchymal lung mass is evident. There is slight lower lobe bronchiectatic change bilaterally. There is a degree of underlying centrilobular emphysematous change. There is no pleural effusion. Upper Abdomen: There is atherosclerotic calcification in the upper abdominal aorta. Adrenals appear unremarkable. Visualized upper abdominal structures otherwise appear unremarkable. Musculoskeletal: There are no blastic or lytic bone lesions. IMPRESSION: 1. Dominant mass arising in the anterior segment of the left upper lobe which abuts the mediastinum at the level of the main pulmonary outflow tract on the left. It is difficult to ascertain with a tumor versus postobstructive consolidation abuts the anterior left pleura. There may be tumor extending into this area. The mass measures 5.6 x 5.1 x 5.3 cm. It has an appearance indicative of pulmonary neoplasm. 2. Foci of adenopathy as noted above. Suspect malignant adenopathy given the dominant mass in the left upper lobe. 3. Underlying centrilobular emphysema. Mild lower lobe bronchiectatic change. 4. Foci of aortic atherosclerosis. Foci of coronary artery calcification. 5.  No adrenal lesions evident. Comment: PET-CT may be quite helpful for staging purposes. PET-CT also may be helpful to ascertain if tumor versus postobstructive pneumonitis is extending to the left anterior pleural surface. Aortic Atherosclerosis (ICD10-I70.0) and Emphysema (ICD10-J43.9). These results will be called to the ordering clinician or representative by the Radiologist Assistant, and communication documented in the PACS or zVision Dashboard. Electronically Signed   By: Lowella Grip III M.D.   On: 10/29/2016  09:24   Mr Jeri Cos YS Contrast  Result Date: 11/19/2016 CLINICAL DATA:  Lung cancer staging. EXAM: MRI HEAD WITHOUT AND WITH CONTRAST TECHNIQUE: Multiplanar, multiecho pulse sequences of the brain and surrounding structures were obtained without and with intravenous contrast. CONTRAST:  8 mL MultiHance IV. COMPARISON:  None. FINDINGS: Brain: Moderate atrophy. Moderate chronic microvascular ischemic change in the white matter. Negative for acute  infarct. Negative for hemorrhage or mass. Postcontrast imaging demonstrates no enhancing mass lesion. Leptomeningeal enhancement normal. Vascular: Increased signal in the periphery of the left cavernous carotid which may represent slow flow. This vessel is probably patent and does show enhancement. Otherwise normal flow voids. Skull and upper cervical spine: Negative Sinuses/Orbits: Negative Other: None IMPRESSION: Negative for metastatic disease to the brain Atrophy and moderate chronic microvascular ischemia. Asymmetric signal in the left internal carotid artery which may represent slow flow. This may be due to left carotid stenosis in the neck. Recommend carotid duplex study. Electronically Signed   By: Franchot Gallo M.D.   On: 11/19/2016 15:40   Nm Pet Image Initial (pi) Skull Base To Thigh  Result Date: 11/06/2016 CLINICAL DATA:  Initial treatment strategy for left upper lobe lung mass. EXAM: NUCLEAR MEDICINE PET SKULL BASE TO THIGH TECHNIQUE: 12.3 mCi F-18 FDG was injected intravenously. Full-ring PET imaging was performed from the skull base to thigh after the radiotracer. CT data was obtained and used for attenuation correction and anatomic localization. FASTING BLOOD GLUCOSE:  Value: 133 mg/dl COMPARISON:  Chest CT dated 10/29/2016 FINDINGS: NECK No hypermetabolic lymph nodes in the neck. CHEST 6.5 by 5.5 cm left upper lobe lung mass has a maximum SUV of 15.8, compatible with malignancy. There is some associated postobstructive atelectasis. Left paratracheal  lymph node 1.5 cm in short axis on image 97/3, maximum SUV 4.0. Right lower paratracheal lymph node 0.9 cm in short axis, maximum SUV 3.4. Background mediastinal blood pool activity 2.9. Atherosclerotic calcification of the aortic arch. ABDOMEN/PELVIS Physiologic activity in the bowel, most notably the colon. No hypermetabolic lesions are identified in the liver, spleen, pancreas, or adrenal glands. No hypermetabolic adenopathy in the abdomen or pelvis. Aortoiliac atherosclerotic vascular disease. Urinary will bladder wall thickening may partially be due to nondistention. SKELETON No focal hypermetabolic activity to suggest skeletal metastasis. IMPRESSION: 1. 6.5 cm hypermetabolic left upper lobe the lung mass partially abutting the mediastinum and left anterior hilum. Probable involvement of the left lower paratracheal lymph node. Questionable involvement of the right lower paratracheal lymph node. Assuming involvement of the left but not the right paratracheal lymph nodes, and assuming non-small cell lung cancer, appearance is compatible with T2b N2 M0 disease (stage IIIa). 2.  Aortic Atherosclerosis (ICD10-I70.0). 3. Urinary bladder wall thickening may be due to nondistention, cystitis not entirely excluded. Electronically Signed   By: Van Clines M.D.   On: 11/06/2016 12:48   Dg C-arm 1-60 Min-no Report  Result Date: 11/05/2016 Fluoroscopy was utilized by the requesting physician.  No radiographic interpretation.   IMPRESSION: 1. 6.5 cm hypermetabolic left upper lobe the lung mass partially abutting the mediastinum and left anterior hilum. Probable involvement of the left lower paratracheal lymph node. Questionable involvement of the right lower paratracheal lymph node. Assuming involvement of the left but not the right paratracheal lymph nodes, and assuming non-small cell lung cancer, appearance is compatible with T2b N2 M0 disease (stage IIIa). 2.  Aortic Atherosclerosis (ICD10-I70.0). 3.  Urinary bladder wall thickening may be due to nondistention, cystitis not entirely excluded.   Electronically Signed   By: Van Clines M.D.   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 Biopsy-positive for malignancy; immunohistochemistry-pending.   # Discussed the role of chemotherapy radiation in the treatment of stage III lung cancer. Discussed that treatment goal  is curative- unfortunately the chance of cure is about 10-20%.   # Hep C status post treatment [2017]  # Decreased appetite/weight loss- recommend referral to dietitian  # chemo ed; also discussed regarding port placement. Also get MRI of the brain.  # Recommend referral to Dr. Donella Stade.  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  Patient's case was also discussed at tumor conference. MRI brain negative for metastases.  All questions were answered. The patient knows to call the clinic with any problems, questions or concerns.    Cammie Sickle, MD 11/22/2016 6:43 PM

## 2016-11-13 NOTE — Telephone Encounter (Signed)
Spoke to the patient regarding the results of the biopsy "small cell lung cancer". Still recommend chemoradiation; however would recommend carbo-etoposide every 3 weeks with RT.   Hayley, please make a follow-up phone call to make sure sure patient/wife understand- the above. I will talk to them again when they come back and see me for the chemotherapy on the 17th.

## 2016-11-13 NOTE — Assessment & Plan Note (Addendum)
#   Left upper lobe lung mass approximately 5 cm in size; positive for mediastinal adenopathy. Biopsy-positive for malignancy; immunohistochemistry-pending.   # Discussed the role of chemotherapy radiation in the treatment of stage III lung cancer. Discussed that treatment goal is curative- unfortunately the chance of cure is only about 10-20%.    # Decreased appetite/weight loss- recommend referral to dietitian  # chemo ed; also discussed regarding port placement. Also get MRI of the brain. Anti-emetics.   # Recommend referral to Dr. Donella Stade.  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  Patient's case was also discussed at tumor conference. MRI brain negative for metastases.

## 2016-11-13 NOTE — Progress Notes (Signed)
  Oncology Nurse Navigator Documentation  Navigator Location: [facility] Onc (11/13/16 1100)   )Navigator Encounter Type: [facility] Follow-up;Diagnostic Results (11/13/16 1100)                     Patient Visit Type: MedOnc (11/13/16 1100) Treatment Phase: Pre-Tx/Tx Discussion (11/13/16 1100) Barriers/Navigation Needs: Education;Coordination of Care (11/13/16 1100) Education: Understanding Cancer/ Treatment Options;Coping with Diagnosis/ Prognosis;Newly Diagnosed Cancer Education (11/13/16 1100) Interventions: Coordination of Care;Education (11/13/16 1100)   Coordination of Care: Appts;Radiology;Chemo (11/13/16 1100) Education Method: Written (11/13/16 1100)     met with patient and wife during follow up appt with Dr. Rogue Bussing to review results from biopsy and PET scan. All questions answered at the time of appt. Provided education regarding diagnosis. Assisted pt and wife with coordinating appts. All upcoming appts reviewed. No further questions at this time. Informed pt and wife to call if has any questions before next appt. Pt and wife verbalized understanding.           Time Spent with Patient: 60 (11/13/16 1100)

## 2016-11-17 ENCOUNTER — Ambulatory Visit
Admission: RE | Admit: 2016-11-17 | Discharge: 2016-11-17 | Disposition: A | Discharge status: Home / Self Care | Payer: Medicare Other | Source: Ambulatory Visit | Attending: Radiation Oncology | Admitting: Radiation Oncology

## 2016-11-17 ENCOUNTER — Encounter: Payer: Self-pay | Admitting: Radiation Oncology

## 2016-11-17 ENCOUNTER — Encounter: Payer: Self-pay | Admitting: *Deleted

## 2016-11-17 VITALS — BP 128/70 | HR 86 | Temp 96.8°F | Wt 190.4 lb

## 2016-11-17 DIAGNOSIS — Z801 Family history of malignant neoplasm of trachea, bronchus and lung: Secondary | ICD-10-CM | POA: Diagnosis not present

## 2016-11-17 DIAGNOSIS — M129 Arthropathy, unspecified: Secondary | ICD-10-CM | POA: Insufficient documentation

## 2016-11-17 DIAGNOSIS — C3412 Malignant neoplasm of upper lobe, left bronchus or lung: Secondary | ICD-10-CM | POA: Diagnosis not present

## 2016-11-17 DIAGNOSIS — Z808 Family history of malignant neoplasm of other organs or systems: Secondary | ICD-10-CM | POA: Diagnosis not present

## 2016-11-17 DIAGNOSIS — Z8041 Family history of malignant neoplasm of ovary: Secondary | ICD-10-CM | POA: Insufficient documentation

## 2016-11-17 DIAGNOSIS — R634 Abnormal weight loss: Secondary | ICD-10-CM | POA: Insufficient documentation

## 2016-11-17 DIAGNOSIS — E119 Type 2 diabetes mellitus without complications: Secondary | ICD-10-CM | POA: Insufficient documentation

## 2016-11-17 DIAGNOSIS — Z87891 Personal history of nicotine dependence: Secondary | ICD-10-CM | POA: Insufficient documentation

## 2016-11-17 DIAGNOSIS — Z800 Family history of malignant neoplasm of digestive organs: Secondary | ICD-10-CM | POA: Diagnosis not present

## 2016-11-17 DIAGNOSIS — Z79899 Other long term (current) drug therapy: Secondary | ICD-10-CM | POA: Diagnosis not present

## 2016-11-17 DIAGNOSIS — K219 Gastro-esophageal reflux disease without esophagitis: Secondary | ICD-10-CM | POA: Diagnosis not present

## 2016-11-17 DIAGNOSIS — E7800 Pure hypercholesterolemia, unspecified: Secondary | ICD-10-CM | POA: Insufficient documentation

## 2016-11-17 DIAGNOSIS — Z510 Encounter for antineoplastic radiation therapy: Secondary | ICD-10-CM | POA: Diagnosis not present

## 2016-11-17 DIAGNOSIS — Z7984 Long term (current) use of oral hypoglycemic drugs: Secondary | ICD-10-CM | POA: Insufficient documentation

## 2016-11-17 DIAGNOSIS — I10 Essential (primary) hypertension: Secondary | ICD-10-CM | POA: Diagnosis not present

## 2016-11-17 DIAGNOSIS — Z8619 Personal history of other infectious and parasitic diseases: Secondary | ICD-10-CM | POA: Insufficient documentation

## 2016-11-17 DIAGNOSIS — J449 Chronic obstructive pulmonary disease, unspecified: Secondary | ICD-10-CM | POA: Insufficient documentation

## 2016-11-17 NOTE — Telephone Encounter (Signed)
Follow up visit made with patient and wife during rad-onc consultation. All future appts reviewed with the patient and his wife. [facility] stated [facility] that cannot be rescheduled. Appt on Monday moved to Wednesday for first chemo treatment. Pt scheduled for CT sim on Tuesday 9/18. New AVS printed and reviewed with pt and his wife. Pt and wife verbalized understanding.

## 2016-11-17 NOTE — Progress Notes (Signed)
NEW PATIENT EVALUATION  Name: Edwin Jones  MRN: [id number]  Date:   11/17/2016     DOB: [birth year]   This 76 y.o. 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 For concurrent chemoradiation  REFERRING PHYSICIAN: Marguerita Merles, MD  CHIEF COMPLAINT:  Chief Complaint  Patient presents with  . Lung Cancer    Initial Evaluation    DIAGNOSIS: There were no encounter diagnoses.   PREVIOUS INVESTIGATIONS:  PET CT scan and CT scans reviewed Pathology report reviewed Clinical notes reviewed  HPI: Patient is a 76 year old male who presented with chronic slightly productive cough leading to an abnormal chest x-ray. CT scans demonstrated a left upper lobe mass. He underwent a PET CT scan showing a 6.5 Simon hypermetabolic left upper lobe mass abutting the mediastinum and left anterior hilum with probable involvement of left lower lobe paratracheal lymph nodes. He underwent bronchoscopy which was positive for small cell lung cancer. PET scan did not demonstrate any disease outside the chest. He has an MRI of his brain scheduled for later this week. He is seen today for radiation oncology opinion. He's had about a 10 pound weight loss over the past several months. No other areas of bony pain except his knees which is attributed to chronic arthritis.  PLANNED TREATMENT REGIMEN: Concurrent chemoradiation  PAST MEDICAL HISTORY:  has a past medical history of COPD (chronic obstructive pulmonary disease) ([place]); Diabetes mellitus without complication ([place]); GERD without esophagitis; Hepatitis C virus; Hypercholesterolemia; Hypertension; and Seasonal allergies.    PAST SURGICAL HISTORY:  Past Surgical History:  Procedure Laterality Date  . COLONOSCOPY     approximately 11 years ago  . FLEXIBLE BRONCHOSCOPY N/A 11/05/2016   Procedure: FLEXIBLE BRONCHOSCOPY;  Surgeon: Wilhelmina Mcardle, MD;  Location: [facility] ORS;  Service:  Pulmonary;  Laterality: N/A;  . TONSILLECTOMY      FAMILY HISTORY: family history includes Lung cancer (age of onset: 68) in his sister; Ovarian cancer in his sister; Skin cancer in his mother and sister; Throat cancer (age of onset: 8) in his brother; Throat cancer (age of onset: 61) in his brother.  SOCIAL HISTORY:  reports that he quit smoking about 12 years ago. His smoking use included Cigarettes. He has a 50.00 pack-year smoking history. He has never used smokeless tobacco. He reports that he does not drink alcohol or use drugs.  ALLERGIES: Penicillin g; Shellfish-derived products; Statins; Erythromycin; Tamsulosin; Tuberculin ppd; and Iodinated diagnostic agents  MEDICATIONS:  Current Outpatient Prescriptions  Medication Sig Dispense Refill  . APPLE CIDER VINEGAR PO Take 25 mg by mouth daily.    Marland Kitchen aspirin EC 81 MG tablet Take 1 tablet by mouth daily.    . cetirizine (ZYRTEC) 5 MG tablet Take 5 mg by mouth daily.    . Coenzyme Q10 100 MG capsule Take 1 capsule by mouth daily.    . Flaxseed, Linseed, (FLAXSEED OIL) 1000 MG CAPS Take 1 capsule by mouth daily.    . fluticasone (FLONASE) 50 MCG/ACT nasal spray Place 1 spray into both nostrils daily as needed for allergies or rhinitis.    Marland Kitchen glipiZIDE (GLUCOTROL XL) 10 MG 24 hr tablet Take 1 tablet by mouth 2 (two) times daily.     Marland Kitchen GNP GARLIC EXTRACT PO Take 244 mg by mouth daily.    . hydrochlorothiazide (HYDRODIURIL) 12.5 MG tablet Take 12.5 mg by mouth daily.    Marland Kitchen lisinopril (PRINIVIL,ZESTRIL) 10  MG tablet Take 10 mg by mouth daily.    . metFORMIN (GLUCOPHAGE) 1000 MG tablet Take 1 tablet by mouth 2 (two) times daily with a meal.    . Multiple Vitamin (MULTI-VITAMINS) TABS Take 1 tablet by mouth daily.    Marland Kitchen omeprazole (PRILOSEC) 20 MG capsule Take 1 capsule by mouth every other day.      No current facility-administered medications for this encounter.     ECOG PERFORMANCE STATUS:  1 - Symptomatic but completely  ambulatory  REVIEW OF SYSTEMS:  Patient denies any weight loss, fatigue, weakness, fever, chills or night sweats. Patient denies any loss of vision, blurred vision. Patient denies any ringing  of the ears or hearing loss. No irregular heartbeat. Patient denies heart murmur or history of fainting. Patient denies any chest pain or pain radiating to her upper extremities. Patient denies any shortness of breath, difficulty breathing at night, cough or hemoptysis. Patient denies any swelling in the lower legs. Patient denies any nausea vomiting, vomiting of blood, or coffee ground material in the vomitus. Patient denies any stomach pain. Patient states has had normal bowel movements no significant constipation or diarrhea. Patient denies any dysuria, hematuria or significant nocturia. Patient denies any problems walking, swelling in the joints or loss of balance. Patient denies any skin changes, loss of hair or loss of weight. Patient denies any excessive worrying or anxiety or significant depression. Patient denies any problems with insomnia. Patient denies excessive thirst, polyuria, polydipsia. Patient denies any swollen glands, patient denies easy bruising or easy bleeding. Patient denies any recent infections, allergies or URI. Patient "s visual fields have not changed significantly in recent time.    PHYSICAL EXAM: BP 128/70   Pulse 86   Temp (!) 96.8 F (36 C)   Wt 190 lb 5.9 oz (86.4 kg)   BMI 24.44 kg/m  Well-developed well-nourished patient in NAD. HEENT reveals PERLA, EOMI, discs not visualized.  Oral cavity is clear. No oral mucosal lesions are identified. Neck is clear without evidence of cervical or supraclavicular adenopathy. Lungs are clear to A&P. Cardiac examination is essentially unremarkable with regular rate and rhythm without murmur rub or thrill. Abdomen is benign with no organomegaly or masses noted. Motor sensory and DTR levels are equal and symmetric in the upper and lower  extremities. Cranial nerves II through XII are grossly intact. Proprioception is intact. No peripheral adenopathy or edema is identified. No motor or sensory levels are noted. Crude visual fields are within normal range.  LABORATORY DATA: Pathology reports reviewed    RADIOLOGY RESULTS: CT scans and PET/CT scans reviewed MRI scan of brain to be reviewed   IMPRESSION: Stage IIIa small cell lung cancer of the left upper lobe in 76 year old male  PLAN: At this time we'll await results of his MRI of his brain. Should that be clear will go ahead with concurrent chemoradiation. Would plan on delivering 6000 cGy over 6 weeks using IM RT radiation therapy. Tumors and extremely close proximity to his heart. Would favor I am RT to spare cardiac volume as well as normal lung volume spinal cord and esophagus. Risks and benefits of treatment including worsening of his cough fatigue alteration of blood counts loss of normal lung volume possible radiation esophagitis alteration of blood counts and skin reaction all were discussed in detail with the patient and his wife. They both seem to comprehend my treatment plan well. I firstly set up and ordered CT similar relation for early next week.There will be  extra effort by both professional staff as well as technical staff to coordinate and manage concurrent chemoradiation and ensuing side effects during his treatments. Should anything change was MRI scan of the brain will certainly change our treatment strategy.  I would like to take this opportunity to thank you for allowing me to participate in the care of your patient.Armstead Peaks., MD

## 2016-11-17 NOTE — Addendum Note (Signed)
Encounter addended by: Christean Grief, RN on: 11/17/2016  3:55 PM<BR>    Actions taken: Charge Capture section accepted

## 2016-11-17 NOTE — Progress Notes (Signed)
  Oncology Nurse Navigator Documentation  Navigator Location: [facility] (11/17/16 1500)   )Navigator Encounter Type: Initial RadOnc (11/17/16 1500)     Confirmed Diagnosis Date: 11/13/16 (11/17/16 1500)                 Treatment Phase: Pre-Tx/Tx Discussion (11/17/16 1500) Barriers/Navigation Needs: Education;Coordination of Care (11/17/16 1500) Education: Understanding Cancer/ Treatment Options;Newly Diagnosed Cancer Education (11/17/16 1500) Interventions: Coordination of Care;Education (11/17/16 1500)   Coordination of Care: Appts;Chemo (11/17/16 1500) Education Method: Written (11/17/16 1500)         met with patient and his wife during rad-onc consultation. All questions answered at the time of visit. Pt and wife given materials regarding diagnosis and given info regarding supportive services. Assisted with coordinating appts to start chemo next week. All upcoming appts reviewed with pt and his wife. Instructed to call if has further questions or needs. Pt and wife verbalized understanding.       Time Spent with Patient: 60 (11/17/16 1500)

## 2016-11-17 NOTE — Patient Instructions (Signed)
Etoposide, VP-16 injection What is this medicine? ETOPOSIDE, VP-16 (e toe POE side) is a chemotherapy drug. It is used to treat testicular cancer, lung cancer, and other cancers. This medicine may be used for other purposes; ask your health care provider or pharmacist if you have questions. COMMON BRAND NAME(S): Etopophos, Toposar, VePesid What should I tell my health care provider before I take this medicine? They need to know if you have any of these conditions: -infection -kidney disease -liver disease -low blood counts, like low white cell, platelet, or red cell counts -an unusual or allergic reaction to etoposide, other medicines, foods, dyes, or preservatives -pregnant or trying to get pregnant -breast-feeding How should I use this medicine? This medicine is for infusion into a vein. It is administered in a hospital or clinic by a specially trained health care professional. Talk to your pediatrician regarding the use of this medicine in children. Special care may be needed. Overdosage: If you think you have taken too much of this medicine contact a poison control center or emergency room at once. NOTE: This medicine is only for you. Do not share this medicine with others. What if I miss a dose? It is important not to miss your dose. Call your doctor or health care professional if you are unable to keep an appointment. What may interact with this medicine? -aspirin -certain medications for seizures like carbamazepine, phenobarbital, phenytoin, valproic acid -cyclosporine -levamisole -warfarin This list may not describe all possible interactions. Give your health care provider a list of all the medicines, herbs, non-prescription drugs, or dietary supplements you use. Also tell them if you smoke, drink alcohol, or use illegal drugs. Some items may interact with your medicine. What should I watch for while using this medicine? Visit your doctor for checks on your progress. This drug  may make you feel generally unwell. This is not uncommon, as chemotherapy can affect healthy cells as well as cancer cells. Report any side effects. Continue your course of treatment even though you feel ill unless your doctor tells you to stop. In some cases, you may be given additional medicines to help with side effects. Follow all directions for their use. Call your doctor or health care professional for advice if you get a fever, chills or sore throat, or other symptoms of a cold or flu. Do not treat yourself. This drug decreases your body's ability to fight infections. Try to avoid being around people who are sick. This medicine may increase your risk to bruise or bleed. Call your doctor or health care professional if you notice any unusual bleeding. Talk to your doctor about your risk of cancer. You may be more at risk for certain types of cancers if you take this medicine. Do not become pregnant while taking this medicine or for at least 6 months after stopping it. Women should inform their doctor if they wish to become pregnant or think they might be pregnant. Women of child-bearing potential will need to have a negative pregnancy test before starting this medicine. There is a potential for serious side effects to an unborn child. Talk to your health care professional or pharmacist for more information. Do not breast-feed an infant while taking this medicine. Men must use a latex condom during sexual contact with a woman while taking this medicine and for at least 4 months after stopping it. A latex condom is needed even if you have had a vasectomy. Contact your doctor right away if your partner becomes pregnant. Do   not donate sperm while taking this medicine and for at least 4 months after you stop taking this medicine. Men should inform their doctors if they wish to father a child. This medicine may lower sperm counts. What side effects may I notice from receiving this medicine? Side effects that  you should report to your doctor or health care professional as soon as possible: -allergic reactions like skin rash, itching or hives, swelling of the face, lips, or tongue -low blood counts - this medicine may decrease the number of white blood cells, red blood cells and platelets. You may be at increased risk for infections and bleeding. -signs of infection - fever or chills, cough, sore throat, pain or difficulty passing urine -signs of decreased platelets or bleeding - bruising, pinpoint red spots on the skin, black, tarry stools, blood in the urine -signs of decreased red blood cells - unusually weak or tired, fainting spells, lightheadedness -breathing problems -changes in vision -mouth or throat sores or ulcers -pain, redness, swelling or irritation at the injection site -pain, tingling, numbness in the hands or feet -redness, blistering, peeling or loosening of the skin, including inside the mouth -seizures -vomiting Side effects that usually do not require medical attention (report to your doctor or health care professional if they continue or are bothersome): -diarrhea -hair loss -loss of appetite -nausea -stomach pain This list may not describe all possible side effects. Call your doctor for medical advice about side effects. You may report side effects to FDA at 1-800-FDA-1088. Where should I keep my medicine? This drug is given in a hospital or clinic and will not be stored at home. NOTE: This sheet is a summary. It may not cover all possible information. If you have questions about this medicine, talk to your doctor, pharmacist, or health care provider.  2018 Elsevier/Gold Standard (2015-02-15 11:53:23) Carboplatin injection What is this medicine? CARBOPLATIN (KAR boe pla tin) is a chemotherapy drug. It targets fast dividing cells, like cancer cells, and causes these cells to die. This medicine is used to treat ovarian cancer and many other cancers. This medicine may be  used for other purposes; ask your health care provider or pharmacist if you have questions. COMMON BRAND NAME(S): Paraplatin What should I tell my health care provider before I take this medicine? They need to know if you have any of these conditions: -blood disorders -hearing problems -kidney disease -recent or ongoing radiation therapy -an unusual or allergic reaction to carboplatin, cisplatin, other chemotherapy, other medicines, foods, dyes, or preservatives -pregnant or trying to get pregnant -breast-feeding How should I use this medicine? This drug is usually given as an infusion into a vein. It is administered in a hospital or clinic by a specially trained health care professional. Talk to your pediatrician regarding the use of this medicine in children. Special care may be needed. Overdosage: If you think you have taken too much of this medicine contact a poison control center or emergency room at once. NOTE: This medicine is only for you. Do not share this medicine with others. What if I miss a dose? It is important not to miss a dose. Call your doctor or health care professional if you are unable to keep an appointment. What may interact with this medicine? -medicines for seizures -medicines to increase blood counts like filgrastim, pegfilgrastim, sargramostim -some antibiotics like amikacin, gentamicin, neomycin, streptomycin, tobramycin -vaccines Talk to your doctor or health care professional before taking any of these medicines: -acetaminophen -aspirin -ibuprofen -ketoprofen -naproxen  This list may not describe all possible interactions. Give your health care provider a list of all the medicines, herbs, non-prescription drugs, or dietary supplements you use. Also tell them if you smoke, drink alcohol, or use illegal drugs. Some items may interact with your medicine. What should I watch for while using this medicine? Your condition will be monitored carefully while you are  receiving this medicine. You will need important blood work done while you are taking this medicine. This drug may make you feel generally unwell. This is not uncommon, as chemotherapy can affect healthy cells as well as cancer cells. Report any side effects. Continue your course of treatment even though you feel ill unless your doctor tells you to stop. In some cases, you may be given additional medicines to help with side effects. Follow all directions for their use. Call your doctor or health care professional for advice if you get a fever, chills or sore throat, or other symptoms of a cold or flu. Do not treat yourself. This drug decreases your body's ability to fight infections. Try to avoid being around people who are sick. This medicine may increase your risk to bruise or bleed. Call your doctor or health care professional if you notice any unusual bleeding. Be careful brushing and flossing your teeth or using a toothpick because you may get an infection or bleed more easily. If you have any dental work done, tell your dentist you are receiving this medicine. Avoid taking products that contain aspirin, acetaminophen, ibuprofen, naproxen, or ketoprofen unless instructed by your doctor. These medicines may hide a fever. Do not become pregnant while taking this medicine. Women should inform their doctor if they wish to become pregnant or think they might be pregnant. There is a potential for serious side effects to an unborn child. Talk to your health care professional or pharmacist for more information. Do not breast-feed an infant while taking this medicine. What side effects may I notice from receiving this medicine? Side effects that you should report to your doctor or health care professional as soon as possible: -allergic reactions like skin rash, itching or hives, swelling of the face, lips, or tongue -signs of infection - fever or chills, cough, sore throat, pain or difficulty passing  urine -signs of decreased platelets or bleeding - bruising, pinpoint red spots on the skin, black, tarry stools, nosebleeds -signs of decreased red blood cells - unusually weak or tired, fainting spells, lightheadedness -breathing problems -changes in hearing -changes in vision -chest pain -high blood pressure -low blood counts - This drug may decrease the number of white blood cells, red blood cells and platelets. You may be at increased risk for infections and bleeding. -nausea and vomiting -pain, swelling, redness or irritation at the injection site -pain, tingling, numbness in the hands or feet -problems with balance, talking, walking -trouble passing urine or change in the amount of urine Side effects that usually do not require medical attention (report to your doctor or health care professional if they continue or are bothersome): -hair loss -loss of appetite -metallic taste in the mouth or changes in taste This list may not describe all possible side effects. Call your doctor for medical advice about side effects. You may report side effects to FDA at 1-800-FDA-1088. Where should I keep my medicine? This drug is given in a hospital or clinic and will not be stored at home. NOTE: This sheet is a summary. It may not cover all possible information. If you have  questions about this medicine, talk to your doctor, pharmacist, or health care provider.  2018 Elsevier/Gold Standard (2007-05-31 14:38:05)

## 2016-11-18 NOTE — Progress Notes (Signed)
START ON PATHWAY REGIMEN - Small Cell Lung     A cycle is every 21 days:     Etoposide      Carboplatin   **Always confirm dose/schedule in your pharmacy ordering system**    Patient Characteristics: Limited Stage, First Line Stage Grouping: Limited AJCC T Category: T4 AJCC N Category: N2 AJCC M Category: M0 AJCC 8 Stage Grouping: IIIB Line of therapy: First Line Would you be surprised if this patient died  in the next year<= I would be surprised if this patient died in the next year Intent of Therapy: Curative Intent, Discussed with Patient

## 2016-11-19 ENCOUNTER — Inpatient Hospital Stay: Payer: Medicare Other

## 2016-11-19 ENCOUNTER — Ambulatory Visit
Admission: RE | Admit: 2016-11-19 | Discharge: 2016-11-19 | Disposition: A | Discharge status: Home / Self Care | Payer: Medicare Other | Source: Ambulatory Visit | Attending: Internal Medicine | Admitting: Internal Medicine

## 2016-11-19 ENCOUNTER — Telehealth: Payer: Self-pay | Admitting: *Deleted

## 2016-11-19 DIAGNOSIS — G319 Degenerative disease of nervous system, unspecified: Secondary | ICD-10-CM | POA: Diagnosis not present

## 2016-11-19 DIAGNOSIS — R9089 Other abnormal findings on diagnostic imaging of central nervous system: Secondary | ICD-10-CM | POA: Diagnosis not present

## 2016-11-19 DIAGNOSIS — C3412 Malignant neoplasm of upper lobe, left bronchus or lung: Secondary | ICD-10-CM | POA: Diagnosis not present

## 2016-11-19 DIAGNOSIS — I6782 Cerebral ischemia: Secondary | ICD-10-CM | POA: Insufficient documentation

## 2016-11-19 MED ORDER — GADOBENATE DIMEGLUMINE 529 MG/ML IV SOLN
8.0000 mL | Freq: Once | INTRAVENOUS | Status: AC | PRN
  Administered 2016-11-19: 8 mL via INTRAVENOUS

## 2016-11-19 NOTE — Progress Notes (Signed)
Nutrition  Patient was scheduled at 8:30am for nutrition appointment but thought he was to meet with RD following chemo class.  RD with patients and unable to accommodate today.  Will see patient during infusion on 9/20. Message relayed to [place] to discuss with patient.    Gabriella Guile B. Zenia Resides, [place], [place] Registered Dietitian [phone] (pager)

## 2016-11-19 NOTE — Telephone Encounter (Signed)
Pt called to request results from brain MRI. Results given to patient. Pt stated that it took the mri tech 4 attempts to start IV and pt requests port to be placed at this time. Informed pt that will send message to [facility] to schedule pt for port placement. Pt informed that port may not be placed before scheduled treatment on 9/19 and that chemo will still be as scheduled without port. Pt verbalized understanding.

## 2016-11-20 ENCOUNTER — Other Ambulatory Visit (INDEPENDENT_AMBULATORY_CARE_PROVIDER_SITE_OTHER): Payer: Self-pay | Admitting: Vascular Surgery

## 2016-11-20 ENCOUNTER — Telehealth (INDEPENDENT_AMBULATORY_CARE_PROVIDER_SITE_OTHER): Payer: Self-pay

## 2016-11-20 NOTE — Telephone Encounter (Signed)
Attempted to contact patient to schedule a port placement and had to leave a message for patient to return my call.

## 2016-11-20 NOTE — Telephone Encounter (Signed)
Patient is scheduled for his port placement.

## 2016-11-22 MED ORDER — ONDANSETRON HCL 8 MG PO TABS: 8.0000 mg | ORAL_TABLET | Freq: Three times a day (TID) | ORAL | 1 refills | Status: DC | PRN

## 2016-11-22 MED ORDER — PROCHLORPERAZINE MALEATE 10 MG PO TABS: 10.0000 mg | ORAL_TABLET | Freq: Four times a day (QID) | ORAL | 1 refills | Status: DC | PRN

## 2016-11-23 ENCOUNTER — Encounter
Admission: RE | Disposition: A | Discharge status: Home / Self Care | Payer: Self-pay | Source: Ambulatory Visit | Attending: Vascular Surgery

## 2016-11-23 ENCOUNTER — Ambulatory Visit
Admission: RE | Admit: 2016-11-23 | Discharge: 2016-11-23 | Disposition: A | Discharge status: Home / Self Care | Payer: Medicare Other | Source: Ambulatory Visit | Attending: Vascular Surgery | Admitting: Vascular Surgery

## 2016-11-23 ENCOUNTER — Ambulatory Visit: Payer: Medicare Other | Admitting: Internal Medicine

## 2016-11-23 ENCOUNTER — Ambulatory Visit: Payer: Medicare Other

## 2016-11-23 ENCOUNTER — Encounter: Payer: Self-pay | Admitting: *Deleted

## 2016-11-23 ENCOUNTER — Other Ambulatory Visit: Payer: Medicare Other

## 2016-11-23 DIAGNOSIS — E7800 Pure hypercholesterolemia, unspecified: Secondary | ICD-10-CM | POA: Diagnosis not present

## 2016-11-23 DIAGNOSIS — Z881 Allergy status to other antibiotic agents status: Secondary | ICD-10-CM | POA: Diagnosis not present

## 2016-11-23 DIAGNOSIS — Z91013 Allergy to seafood: Secondary | ICD-10-CM | POA: Diagnosis not present

## 2016-11-23 DIAGNOSIS — C3412 Malignant neoplasm of upper lobe, left bronchus or lung: Secondary | ICD-10-CM | POA: Diagnosis not present

## 2016-11-23 DIAGNOSIS — Z888 Allergy status to other drugs, medicaments and biological substances status: Secondary | ICD-10-CM | POA: Diagnosis not present

## 2016-11-23 DIAGNOSIS — Z87891 Personal history of nicotine dependence: Secondary | ICD-10-CM | POA: Diagnosis not present

## 2016-11-23 DIAGNOSIS — Z9889 Other specified postprocedural states: Secondary | ICD-10-CM | POA: Diagnosis not present

## 2016-11-23 DIAGNOSIS — I10 Essential (primary) hypertension: Secondary | ICD-10-CM | POA: Diagnosis not present

## 2016-11-23 DIAGNOSIS — Z801 Family history of malignant neoplasm of trachea, bronchus and lung: Secondary | ICD-10-CM | POA: Insufficient documentation

## 2016-11-23 DIAGNOSIS — Z91041 Radiographic dye allergy status: Secondary | ICD-10-CM | POA: Diagnosis not present

## 2016-11-23 DIAGNOSIS — Z880 Allergy status to penicillin: Secondary | ICD-10-CM | POA: Insufficient documentation

## 2016-11-23 DIAGNOSIS — K219 Gastro-esophageal reflux disease without esophagitis: Secondary | ICD-10-CM | POA: Diagnosis not present

## 2016-11-23 DIAGNOSIS — Z7984 Long term (current) use of oral hypoglycemic drugs: Secondary | ICD-10-CM | POA: Diagnosis not present

## 2016-11-23 DIAGNOSIS — E119 Type 2 diabetes mellitus without complications: Secondary | ICD-10-CM | POA: Diagnosis not present

## 2016-11-23 DIAGNOSIS — Z7982 Long term (current) use of aspirin: Secondary | ICD-10-CM | POA: Diagnosis not present

## 2016-11-23 DIAGNOSIS — B182 Chronic viral hepatitis C: Secondary | ICD-10-CM | POA: Insufficient documentation

## 2016-11-23 DIAGNOSIS — Z8041 Family history of malignant neoplasm of ovary: Secondary | ICD-10-CM | POA: Insufficient documentation

## 2016-11-23 DIAGNOSIS — C3490 Malignant neoplasm of unspecified part of unspecified bronchus or lung: Secondary | ICD-10-CM

## 2016-11-23 DIAGNOSIS — Z808 Family history of malignant neoplasm of other organs or systems: Secondary | ICD-10-CM | POA: Diagnosis not present

## 2016-11-23 HISTORY — PX: PORTA CATH INSERTION: CATH118285

## 2016-11-23 SURGERY — PORTA CATH INSERTION
Anesthesia: Moderate Sedation

## 2016-11-23 MED ORDER — HYDROMORPHONE HCL 1 MG/ML IJ SOLN: 1.0000 mg | Freq: Once | INTRAMUSCULAR | Status: DC | PRN

## 2016-11-23 MED ORDER — SODIUM CHLORIDE 0.9 % IR SOLN
Freq: Once | Status: DC
  Filled 2016-11-23: qty 2

## 2016-11-23 MED ORDER — MIDAZOLAM HCL 5 MG/5ML IJ SOLN
INTRAMUSCULAR | Status: AC
  Filled 2016-11-23: qty 5

## 2016-11-23 MED ORDER — LIDOCAINE-EPINEPHRINE (PF) 2 %-1:200000 IJ SOLN
INTRAMUSCULAR | Status: AC
  Filled 2016-11-23: qty 20

## 2016-11-23 MED ORDER — CLINDAMYCIN PHOSPHATE 300 MG/50ML IV SOLN
INTRAVENOUS | Status: AC
  Administered 2016-11-23: 300 mg via INTRAVENOUS
  Filled 2016-11-23: qty 50

## 2016-11-23 MED ORDER — HEPARIN (PORCINE) IN NACL 2-0.9 UNIT/ML-% IJ SOLN
INTRAMUSCULAR | Status: AC
  Filled 2016-11-23: qty 500

## 2016-11-23 MED ORDER — MIDAZOLAM HCL 2 MG/2ML IJ SOLN
INTRAMUSCULAR | Status: DC | PRN
  Administered 2016-11-23: 1 mg via INTRAVENOUS
  Administered 2016-11-23: 2 mg via INTRAVENOUS

## 2016-11-23 MED ORDER — SODIUM CHLORIDE 0.9 % IV SOLN
INTRAVENOUS | Status: DC
  Administered 2016-11-23: 12:00:00 via INTRAVENOUS

## 2016-11-23 MED ORDER — ONDANSETRON HCL 4 MG/2ML IJ SOLN: 4.0000 mg | Freq: Four times a day (QID) | INTRAMUSCULAR | Status: DC | PRN

## 2016-11-23 MED ORDER — FENTANYL CITRATE (PF) 100 MCG/2ML IJ SOLN
INTRAMUSCULAR | Status: AC
  Filled 2016-11-23: qty 2

## 2016-11-23 MED ORDER — CLINDAMYCIN PHOSPHATE 300 MG/50ML IV SOLN
300.0000 mg | Freq: Once | INTRAVENOUS | Status: AC
  Administered 2016-11-23: 300 mg via INTRAVENOUS

## 2016-11-23 MED ORDER — FENTANYL CITRATE (PF) 100 MCG/2ML IJ SOLN
INTRAMUSCULAR | Status: DC | PRN
  Administered 2016-11-23 (×2): 25 ug via INTRAVENOUS
  Administered 2016-11-23: 50 ug via INTRAVENOUS

## 2016-11-23 SURGICAL SUPPLY — 9 items
DERMABOND ADVANCED (GAUZE/BANDAGES/DRESSINGS) ×2
DERMABOND ADVANCED .7 DNX12 (GAUZE/BANDAGES/DRESSINGS) ×1 IMPLANT
KIT PORT POWER 8FR ISP CVUE (Miscellaneous) ×3 IMPLANT
PACK ANGIOGRAPHY (CUSTOM PROCEDURE TRAY) ×3 IMPLANT
PAD GROUND ADULT SPLIT (MISCELLANEOUS) ×3 IMPLANT
PENCIL ELECTRO HAND CTR (MISCELLANEOUS) ×3 IMPLANT
SUT MNCRL AB 4-0 PS2 18 (SUTURE) ×3 IMPLANT
SUT PROLENE 0 CT 1 30 (SUTURE) ×3 IMPLANT
SUTURE VIC 3-0 (SUTURE) ×3 IMPLANT

## 2016-11-23 NOTE — H&P (Signed)
[facility] History & Physical Update  The patient was interviewed and re-examined.  The patient's previous History and Physical has been reviewed and is unchanged.  There is no change in the plan of care. We plan to proceed with the scheduled procedure.  Leotis Pain, MD  11/23/2016, 12:47 PM

## 2016-11-23 NOTE — Discharge Instructions (Signed)
Implanted Port Insertion, Care After °This sheet gives you information about how to care for yourself after your procedure. Your health care provider may also give you more specific instructions. If you have problems or questions, contact your health care provider. °What can I expect after the procedure? °After your procedure, it is common to have: °· Discomfort at the port insertion site. °· Bruising on the skin over the port. This should improve over 3-4 days. ° °Follow these instructions at home: °Port care °· After your port is placed, you will get a manufacturer's information card. The card has information about your port. Keep this card with you at all times. °· Take care of the port as told by your health care provider. Ask your health care provider if you or a family member can get training for taking care of the port at home. A home health care nurse may also take care of the port. °· Make sure to remember what type of port you have. °Incision care °· Follow instructions from your health care provider about how to take care of your port insertion site. Make sure you: °? Wash your hands with soap and water before you change your bandage (dressing). If soap and water are not available, use hand sanitizer. °? Change your dressing as told by your health care provider. °? Leave stitches (sutures), skin glue, or adhesive strips in place. These skin closures may need to stay in place for 2 weeks or longer. If adhesive strip edges start to loosen and curl up, you may trim the loose edges. Do not remove adhesive strips completely unless your health care provider tells you to do that. °· Check your port insertion site every day for signs of infection. Check for: °? More redness, swelling, or pain. °? More fluid or blood. °? Warmth. °? Pus or a bad smell. °General instructions °· Do not take baths, swim, or use a hot tub until your health care provider approves. °· Do not lift anything that is heavier than 10 lb (4.5  kg) for a week, or as told by your health care provider. °· Ask your health care provider when it is okay to: °? Return to work or school. °? Resume usual physical activities or sports. °· Do not drive for 24 hours if you were given a medicine to help you relax (sedative). °· Take over-the-counter and prescription medicines only as told by your health care provider. °· Wear a medical alert bracelet in case of an emergency. This will tell any health care providers that you have a port. °· Keep all follow-up visits as told by your health care provider. This is important. °Contact a health care provider if: °· You cannot flush your port with saline as directed, or you cannot draw blood from the port. °· You have a fever or chills. °· You have more redness, swelling, or pain around your port insertion site. °· You have more fluid or blood coming from your port insertion site. °· Your port insertion site feels warm to the touch. °· You have pus or a bad smell coming from the port insertion site. °Get help right away if: °· You have chest pain or shortness of breath. °· You have bleeding from your port that you cannot control. °Summary °· Take care of the port as told by your health care provider. °· Change your dressing as told by your health care provider. °· Keep all follow-up visits as told by your health care provider. °  This information is not intended to replace advice given to you by your health care provider. Make sure you discuss any questions you have with your health care provider. °Document Released: 12/14/2012 Document Revised: 01/15/2016 Document Reviewed: 01/15/2016 °Elsevier Interactive Patient Education © 2017 [employer] °Moderate Conscious Sedation, Adult, Care After °These instructions provide you with information about caring for yourself after your procedure. Your health care provider may also give you more specific instructions. Your treatment has been planned according to current medical  practices, but problems sometimes occur. Call your health care provider if you have any problems or questions after your procedure. °What can I expect after the procedure? °After your procedure, it is common: °· To feel sleepy for several hours. °· To feel clumsy and have poor balance for several hours. °· To have poor judgment for several hours. °· To vomit if you eat too soon. ° °Follow these instructions at home: °For at least 24 hours after the procedure: ° °· Do not: °? Participate in activities where you could fall or become injured. °? Drive. °? Use heavy machinery. °? Drink alcohol. °? Take sleeping pills or medicines that cause drowsiness. °? Make important decisions or sign legal documents. °? Take care of children on your own. °· Rest. °Eating and drinking °· Follow the diet recommended by your health care provider. °· If you vomit: °? Drink water, juice, or soup when you can drink without vomiting. °? Make sure you have little or no nausea before eating solid foods. °General instructions °· Have a responsible adult stay with you until you are awake and alert. °· Take over-the-counter and prescription medicines only as told by your health care provider. °· If you smoke, do not smoke without supervision. °· Keep all follow-up visits as told by your health care provider. This is important. °Contact a health care provider if: °· You keep feeling nauseous or you keep vomiting. °· You feel light-headed. °· You develop a rash. °· You have a fever. °Get help right away if: °· You have trouble breathing. °This information is not intended to replace advice given to you by your health care provider. Make sure you discuss any questions you have with your health care provider. °Document Released: 12/14/2012 Document Revised: 07/29/2015 Document Reviewed: 06/15/2015 °Elsevier Interactive Patient Education © 2018 Elsevier Inc. ° °

## 2016-11-23 NOTE — Progress Notes (Signed)
Pt awake and alert, sitting up in bed w/o discomfort.  VSS at this time,  No bleeding from insertion site.  Pt tolerating PO fluids and sandwich at this time.  Dr. Lucky Cowboy at bedside to discuss procedure with pt and family.   Discharge instructions reviewed with pt and family who both verbalize understanding of discharge and follow up instructions.

## 2016-11-24 ENCOUNTER — Encounter: Payer: Self-pay | Admitting: *Deleted

## 2016-11-24 ENCOUNTER — Encounter: Payer: Self-pay | Admitting: Vascular Surgery

## 2016-11-24 ENCOUNTER — Ambulatory Visit
Admission: RE | Admit: 2016-11-24 | Discharge: 2016-11-24 | Disposition: A | Discharge status: Home / Self Care | Payer: Medicare Other | Source: Ambulatory Visit | Attending: Radiation Oncology | Admitting: Radiation Oncology

## 2016-11-24 ENCOUNTER — Other Ambulatory Visit: Payer: Self-pay | Admitting: *Deleted

## 2016-11-24 ENCOUNTER — Inpatient Hospital Stay: Payer: Medicare Other

## 2016-11-24 ENCOUNTER — Telehealth: Payer: Self-pay | Admitting: *Deleted

## 2016-11-24 DIAGNOSIS — C3412 Malignant neoplasm of upper lobe, left bronchus or lung: Secondary | ICD-10-CM

## 2016-11-24 DIAGNOSIS — Z510 Encounter for antineoplastic radiation therapy: Secondary | ICD-10-CM | POA: Diagnosis not present

## 2016-11-24 MED ORDER — LIDOCAINE-PRILOCAINE 2.5-2.5 % EX CREA: 1.0000 "application " | TOPICAL_CREAM | CUTANEOUS | 3 refills | Status: DC | PRN

## 2016-11-24 NOTE — Progress Notes (Signed)
  Oncology Nurse Navigator Documentation  Navigator Location: [facility] (11/24/16 1300)   )Navigator Encounter Type: Lobby (11/24/16 1300)                     Patient Visit Type: [facility] (11/24/16 1300) Treatment Phase: CT SIM (11/24/16 1300) Barriers/Navigation Needs: No barriers at this time (11/24/16 1300)   Interventions: Other (11/24/16 1300)      met with pt in lobby prior to CT Sim today. Pt's wife requested pillow to cover port for seatbelt protection, requests images to be put on disc for patient records, and also requested that emla cream be called into pharmacy. All requests were take care of at the time of visit. Instructed pt to pick up prescription for emla cream on their way home today. Informed pt's wife that will get images on a disc and give to them at appt tomorrow. No further questions or needs at this time.                Time Spent with Patient: 15 (11/24/16 1300)

## 2016-11-24 NOTE — Telephone Encounter (Signed)
Pt had CT simulation appt today and spoke to me while in the lobby. Pt had port placed yesterday and is requesting prescription for EMLA cream. EMLA has been escribed to pt's pharmacy. Pt is aware to pick up today and understands instructions for use prior to treatment tomorrow.

## 2016-11-24 NOTE — Op Note (Signed)
      [facility]       Operative Note  Date: 11/23/2016  Preoperative diagnosis:  1. Lung cancer  Postoperative diagnosis:  Same as above  Procedures: #1. Ultrasound guidance for vascular access to the right internal jugular vein. #2. Fluoroscopic guidance for placement of catheter. #3. Placement of CT compatible Port-A-Cath, right internal jugular vein.  Surgeon: Leotis Pain, MD.   Anesthesia: Local with moderate conscious sedation for approximately 20  minutes using 3 mg of Versed and 100 mcg of Fentanyl  Fluoroscopy time: less than 1 minute  Contrast used: 0  Estimated blood loss: 5 cc  Indication for the procedure:  The patient is a 76 y.o.male with lung cancer.  The patient needs a Port-A-Cath for durable venous access, chemotherapy, lab draws, and CT scans. We are asked to place this. Risks and benefits were discussed and informed consent was obtained.  Description of procedure: The patient was brought to the vascular and interventional radiology suite.  Moderate conscious sedation was administered throughout the procedure during a face to face encounter with the patient with my supervision of the RN administering medicines and monitoring the patient's vital signs, pulse oximetry, telemetry and mental status throughout from the start of the procedure until the patient was taken to the recovery room. The right neck chest and shoulder were sterilely prepped and draped, and a sterile surgical field was created. Ultrasound was used to help visualize a patent right internal jugular vein. This was then accessed under direct ultrasound guidance without difficulty with the Seldinger needle and a permanent image was recorded. A J-wire was placed. After skin nick and dilatation, the peel-away sheath was then placed over the wire. I then anesthetized an area under the clavicle approximately 1-2 fingerbreadths. A transverse incision was created and an inferior pocket was  created with electrocautery and blunt dissection. The port was then brought onto the field, placed into the pocket and secured to the chest wall with 2 Prolene sutures. The catheter was connected to the port and tunneled from the subclavicular incision to the access site. Fluoroscopic guidance was then used to cut the catheter to an appropriate length. The catheter was then placed through the peel-away sheath and the peel-away sheath was removed. The catheter tip was parked in excellent location under fluorocoscopic guidance in the SVC just above the right atrium. The pocket was then irrigated with antibiotic impregnated saline and the wound was closed with a running 3-0 Vicryl and a 4-0 Monocryl. The access incision was closed with a single 4-0 Monocryl. The Huber needle was used to withdraw blood and flush the port with heparinized saline. Dermabond was then placed as a dressing. The patient tolerated the procedure well and was taken to the recovery room in stable condition.   Leotis Pain 11/24/2016 9:55 AM   This note was created with Dragon Medical transcription system. Any errors in dictation are purely unintentional.

## 2016-11-25 ENCOUNTER — Inpatient Hospital Stay: Payer: Medicare Other

## 2016-11-25 ENCOUNTER — Inpatient Hospital Stay (HOSPITAL_BASED_OUTPATIENT_CLINIC_OR_DEPARTMENT_OTHER): Payer: Medicare Other | Admitting: Internal Medicine

## 2016-11-25 VITALS — BP 137/72 | HR 75 | Temp 96.6°F | Resp 16 | Wt 196.6 lb

## 2016-11-25 DIAGNOSIS — E7800 Pure hypercholesterolemia, unspecified: Secondary | ICD-10-CM | POA: Diagnosis not present

## 2016-11-25 DIAGNOSIS — J449 Chronic obstructive pulmonary disease, unspecified: Secondary | ICD-10-CM

## 2016-11-25 DIAGNOSIS — Z880 Allergy status to penicillin: Secondary | ICD-10-CM

## 2016-11-25 DIAGNOSIS — N183 Chronic kidney disease, stage 3 (moderate): Secondary | ICD-10-CM | POA: Diagnosis not present

## 2016-11-25 DIAGNOSIS — C3412 Malignant neoplasm of upper lobe, left bronchus or lung: Secondary | ICD-10-CM

## 2016-11-25 DIAGNOSIS — G8929 Other chronic pain: Secondary | ICD-10-CM

## 2016-11-25 DIAGNOSIS — E1122 Type 2 diabetes mellitus with diabetic chronic kidney disease: Secondary | ICD-10-CM

## 2016-11-25 DIAGNOSIS — Z79899 Other long term (current) drug therapy: Secondary | ICD-10-CM

## 2016-11-25 DIAGNOSIS — I129 Hypertensive chronic kidney disease with stage 1 through stage 4 chronic kidney disease, or unspecified chronic kidney disease: Secondary | ICD-10-CM

## 2016-11-25 DIAGNOSIS — M1990 Unspecified osteoarthritis, unspecified site: Secondary | ICD-10-CM

## 2016-11-25 DIAGNOSIS — Z7982 Long term (current) use of aspirin: Secondary | ICD-10-CM

## 2016-11-25 DIAGNOSIS — Z801 Family history of malignant neoplasm of trachea, bronchus and lung: Secondary | ICD-10-CM

## 2016-11-25 DIAGNOSIS — Z808 Family history of malignant neoplasm of other organs or systems: Secondary | ICD-10-CM

## 2016-11-25 DIAGNOSIS — I700 Atherosclerosis of aorta: Secondary | ICD-10-CM

## 2016-11-25 DIAGNOSIS — B1920 Unspecified viral hepatitis C without hepatic coma: Secondary | ICD-10-CM

## 2016-11-25 DIAGNOSIS — Z8041 Family history of malignant neoplasm of ovary: Secondary | ICD-10-CM

## 2016-11-25 DIAGNOSIS — Z7984 Long term (current) use of oral hypoglycemic drugs: Secondary | ICD-10-CM

## 2016-11-25 DIAGNOSIS — K219 Gastro-esophageal reflux disease without esophagitis: Secondary | ICD-10-CM

## 2016-11-25 DIAGNOSIS — Z5111 Encounter for antineoplastic chemotherapy: Secondary | ICD-10-CM | POA: Diagnosis not present

## 2016-11-25 LAB — COMPREHENSIVE METABOLIC PANEL
ALBUMIN: 3 g/dL — AB (ref 3.5–5.0)
ALT: 8 U/L — ABNORMAL LOW (ref 17–63)
ANION GAP: 8 (ref 5–15)
AST: 15 U/L (ref 15–41)
Alkaline Phosphatase: 69 U/L (ref 38–126)
BUN: 37 mg/dL — ABNORMAL HIGH (ref 6–20)
CHLORIDE: 103 mmol/L (ref 101–111)
CO2: 23 mmol/L (ref 22–32)
Calcium: 9 mg/dL (ref 8.9–10.3)
Creatinine, Ser: 2.13 mg/dL — ABNORMAL HIGH (ref 0.61–1.24)
GFR calc non Af Amer: 28 mL/min — ABNORMAL LOW (ref >60)
GFR, EST AFRICAN AMERICAN: 33 mL/min — AB (ref >60)
GLUCOSE: 247 mg/dL — AB (ref 65–99)
Potassium: 4.7 mmol/L (ref 3.5–5.1)
SODIUM: 134 mmol/L — AB (ref 135–145)
Total Bilirubin: 0.4 mg/dL (ref 0.3–1.2)
Total Protein: 7.2 g/dL (ref 6.5–8.1)

## 2016-11-25 LAB — CBC WITH DIFFERENTIAL/PLATELET
BASOS PCT: 1 %
Basophils Absolute: 0.1 10*3/uL (ref 0–0.1)
EOS ABS: 0.3 10*3/uL (ref 0–0.7)
EOS PCT: 4 %
HCT: 29.1 % — ABNORMAL LOW (ref 40.0–52.0)
Hemoglobin: 9.9 g/dL — ABNORMAL LOW (ref 13.0–18.0)
LYMPHS ABS: 1.4 10*3/uL (ref 1.0–3.6)
Lymphocytes Relative: 18 %
MCH: 28.8 pg (ref 26.0–34.0)
MCHC: 33.8 g/dL (ref 32.0–36.0)
MCV: 85.3 fL (ref 80.0–100.0)
Monocytes Absolute: 0.9 10*3/uL (ref 0.2–1.0)
Monocytes Relative: 12 %
Neutro Abs: 5.1 10*3/uL (ref 1.4–6.5)
Neutrophils Relative %: 65 %
PLATELETS: 425 10*3/uL (ref 150–440)
RBC: 3.42 MIL/uL — AB (ref 4.40–5.90)
RDW: 15.6 % — ABNORMAL HIGH (ref 11.5–14.5)
WBC: 7.8 10*3/uL (ref 3.8–10.6)

## 2016-11-25 MED ORDER — SODIUM CHLORIDE 0.9 % IV SOLN
305.5000 mg | Freq: Once | INTRAVENOUS | Status: AC
  Administered 2016-11-25: 310 mg via INTRAVENOUS
  Filled 2016-11-25: qty 31

## 2016-11-25 MED ORDER — DEXAMETHASONE SODIUM PHOSPHATE 10 MG/ML IJ SOLN
10.0000 mg | Freq: Once | INTRAMUSCULAR | Status: AC
  Administered 2016-11-25: 10 mg via INTRAVENOUS
  Filled 2016-11-25: qty 1
  Filled 2016-11-25: qty 3

## 2016-11-25 MED ORDER — HEPARIN SOD (PORK) LOCK FLUSH 100 UNIT/ML IV SOLN
500.0000 [IU] | Freq: Once | INTRAVENOUS | Status: AC | PRN
Start: 2016-11-25 — End: 2016-11-25
  Administered 2016-11-25: 500 [IU]
  Filled 2016-11-25: qty 5

## 2016-11-25 MED ORDER — SODIUM CHLORIDE 0.9% FLUSH
10.0000 mL | INTRAVENOUS | Status: DC | PRN
  Administered 2016-11-25: 10 mL
  Filled 2016-11-25: qty 10

## 2016-11-25 MED ORDER — SODIUM CHLORIDE 0.9 % IV SOLN: 100.0000 mg/m2 | Freq: Once | INTRAVENOUS | Status: DC

## 2016-11-25 MED ORDER — PALONOSETRON HCL INJECTION 0.25 MG/5ML
0.2500 mg | Freq: Once | INTRAVENOUS | Status: AC
Start: 2016-11-25 — End: 2016-11-25
  Administered 2016-11-25: 0.25 mg via INTRAVENOUS
  Filled 2016-11-25: qty 5

## 2016-11-25 MED ORDER — SODIUM CHLORIDE 0.9 % IV SOLN
75.0000 mg/m2 | Freq: Once | INTRAVENOUS | Status: AC
  Administered 2016-11-25: 160 mg via INTRAVENOUS
  Filled 2016-11-25: qty 8

## 2016-11-25 MED ORDER — SODIUM CHLORIDE 0.9 % IV SOLN: 10.0000 mg | Freq: Once | INTRAVENOUS | Status: DC

## 2016-11-25 MED ORDER — SODIUM CHLORIDE 0.9 % IV SOLN
Freq: Once | INTRAVENOUS | Status: AC
  Administered 2016-11-25: 10:00:00 via INTRAVENOUS
  Filled 2016-11-25: qty 1000

## 2016-11-25 NOTE — Progress Notes (Signed)
Creatinine: 2.13. MD, Dr. Rogue Bussing notified and aware. Per MD order: proceed with scheduled treatment today.

## 2016-11-25 NOTE — Assessment & Plan Note (Addendum)
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  Discussed the role of chemotherapy radiation in the treatment of stage III lung cancer. Discussed that treatment goal is curative- unfortunately the chance of cure is only about 10-20%; the median survival is about 18 months.  # Carbo-Etposide- every [redacted] weeks along with radiation. Proceed with cycle #1 chemotherapy today. Labs acceptable. Recommend 4 cycles of chemotherapy.  # Diabetes- recommend checking blood sugars closely; especially on the days on chemotherapy- as I expect that the blood sugars to go up from steroids/premedications.  # Decreased appetite/weight loss- recommend referral to dietitian.  # cabo-etop/3 weeks/labs/MD; 10 days-labs only.

## 2016-11-25 NOTE — Progress Notes (Signed)
Etoposide dose is 100mg /m2. CrCl is 79ml/min. Called MD to adjust dose to 75% of original due to CrCl. Orders given to adjust to 75mg /m2.

## 2016-11-25 NOTE — Progress Notes (Signed)
Patient is here today for a follow up. Patient states no new concerns today.  

## 2016-11-25 NOTE — Progress Notes (Signed)
[facility] NOTE  Patient Care Team: Marguerita Merles, MD as PCP - General (Family Medicine) Telford Nab, RN as Registered Nurse  CHIEF COMPLAINTS/PURPOSE OF CONSULTATION: LEFT LUNG MASS     Oncology History   # AUG 2018- LIMITED STAGE SMALL CELL LUNG CA ;[ [id number]] LEFT UPPER LUNG MASS- ~5-5.5cm; positive for M-LN. PET 2018- no distant mets.   # SEP 19th 2018- CARBO-ETOP   # Sep 2018-MRI brain-NEG  # Hep C [? Immunization in [employer]; s/p Harvoni at [place]; 2017]; CKD Stage III     Cancer of upper lobe of left lung (HCC)   HISTORY OF PRESENTING ILLNESS:  Edwin Jones 76 y.o.  male recently diagnosed small cell lung cancer; Limited stage is here to proceed with cycle #1 of chemotherapy. Patient had a port placement.  He denies any new symptoms since last visit. Chronic mild shortness of breath chronic cough.  Patient denies any hemoptysis. Denies any headaches.Otherwise his appetite is fair. Denies any falls. Denies any bone pain. Has chronic arthritic pain.   ROS: A complete 10 point review of system is done which is negative except mentioned above in history of present illness  MEDICAL HISTORY:  Past Medical History:  Diagnosis Date  . COPD (chronic obstructive pulmonary disease) ([place])   . Diabetes mellitus without complication ([place])   . GERD without esophagitis   . Hepatitis C virus    history of hep C treated with Harvoni  . Hypercholesterolemia   . Hypertension   . Seasonal allergies     SURGICAL HISTORY: Past Surgical History:  Procedure Laterality Date  . COLONOSCOPY     approximately 11 years ago  . FLEXIBLE BRONCHOSCOPY N/A 11/05/2016   Procedure: FLEXIBLE BRONCHOSCOPY;  Surgeon: Wilhelmina Mcardle, MD;  Location: [facility];  Service: Pulmonary;  Laterality: N/A;  . PORTA CATH INSERTION N/A 11/23/2016   Procedure: Glori Luis Cath Insertion;  Surgeon: Algernon Huxley, MD;  Location: [facility];  Service: Cardiovascular;  Laterality: N/A;  .  TONSILLECTOMY      SOCIAL HISTORY: Social History   Social History  . Marital status: Married    Spouse name: N/A  . Number of children: N/A  . Years of education: N/A   Occupational History  . Not on file.   Social History Main Topics  . Smoking status: Former Smoker    Packs/day: 1.00    Years: 50.00    Types: Cigarettes    Quit date: 04/07/2004  . Smokeless tobacco: Never Used  . Alcohol use No     Comment: history of alcohol use  . Drug use: No  . Sexual activity: Yes   Other Topics Concern  . Not on file   Social History Narrative  . No narrative on file    FAMILY HISTORY: Family History  Problem Relation Age of Onset  . Skin cancer Mother   . Ovarian cancer Sister        sister was diagnosed as a teenager  . Throat cancer Brother 44       brother #1  . Lung cancer Sister 91  . Throat cancer Brother 29       brother #2  . Skin cancer Sister     ALLERGIES:  is allergic to penicillin g; shellfish-derived products; statins; erythromycin; tamsulosin; tuberculin ppd; and iodinated diagnostic agents.  MEDICATIONS:  Current Outpatient Prescriptions  Medication Sig Dispense Refill  . aspirin EC 81 MG tablet Take 81 mg by mouth  at bedtime.     . cetirizine (ZYRTEC) 10 MG tablet Take 10 mg by mouth daily.    . cholecalciferol (VITAMIN D) 1000 units tablet Take 1,000 Units by mouth daily.    . Coenzyme Q10 100 MG capsule Take 100 mg by mouth daily.     . Flaxseed, Linseed, (FLAXSEED OIL) 1000 MG CAPS Take 1,000 mg by mouth at bedtime.     . fluticasone (FLONASE) 50 MCG/ACT nasal spray Place 1 spray into both nostrils daily as needed for allergies or rhinitis.    Marland Kitchen glipiZIDE (GLUCOTROL) 5 MG tablet Take 5 mg by mouth 2 (two) times daily.    Marland Kitchen GNP GARLIC EXTRACT PO Take 3,244 mg by mouth daily.     . hydrochlorothiazide (HYDRODIURIL) 25 MG tablet Take 25 mg by mouth daily.    Marland Kitchen lidocaine-prilocaine (EMLA) cream Apply 1 application topically as needed. 30 g 3  .  lisinopril (PRINIVIL,ZESTRIL) 10 MG tablet Take 10 mg by mouth daily.    . metFORMIN (GLUCOPHAGE) 1000 MG tablet Take 1,000 mg by mouth 2 (two) times daily with a meal.     . Multiple Vitamin (MULTIVITAMIN WITH MINERALS) TABS tablet Take 1 tablet by mouth daily.    Marland Kitchen omeprazole (PRILOSEC) 20 MG capsule Take 20 mg by mouth every Monday, Wednesday, and Friday.     . ondansetron (ZOFRAN) 8 MG tablet Take 1 tablet (8 mg total) by mouth every 8 (eight) hours as needed for nausea or vomiting (start 3 days; after chemo). 40 tablet 1  . prochlorperazine (COMPAZINE) 10 MG tablet Take 1 tablet (10 mg total) by mouth every 6 (six) hours as needed for nausea or vomiting. 40 tablet 1  . APPLE CIDER VINEGAR PO Take 450 mg by mouth at bedtime.      No current facility-administered medications for this visit.       Marland Kitchen  PHYSICAL EXAMINATION: ECOG PERFORMANCE STATUS: 1 - Symptomatic but completely ambulatory  Vitals:   11/25/16 0840  BP: 137/72  Pulse: 75  Resp: 16  Temp: (!) 96.6 F (35.9 C)   Filed Weights   11/25/16 0840  Weight: 196 lb 9.6 oz (89.2 kg)    GENERAL: Well-nourished well-developed; Alert, no distress and comfortable.   With his wife.  EYES: no pallor or icterus OROPHARYNX: no thrush or ulceration; dentures.   NECK: supple, no masses felt LYMPH:  no palpable lymphadenopathy in the cervical, axillary or inguinal regions LUNGS: clear to auscultation and  No wheeze or crackles HEART/CVS: regular rate & rhythm and no murmurs; No lower extremity edema ABDOMEN: abdomen soft, non-tender and normal bowel sounds Musculoskeletal:no cyanosis of digits and no clubbing  PSYCH: alert & oriented x 3 with fluent speech NEURO: no focal motor/sensory deficits SKIN:  no rashes or significant lesions  LABORATORY DATA:  I have reviewed the data as listed Lab Results  Component Value Date   WBC 4.5 12/04/2016   HGB 10.2 (L) 12/04/2016   HCT 30.1 (L) 12/04/2016   MCV 85.3 12/04/2016   PLT  334 12/04/2016    Recent Labs  10/30/16 1145 11/25/16 0812 12/04/16 1104  NA 136 134* 133*  K 4.7 4.7 4.8  CL 102 103 102  CO2 _0 GLUCOSE 151* 247* 173*  BUN 32* 37* 34*  CREATININE 1.76* 2.13* 1.71*  CALCIUM 9.4 9.0 8.9  GFRNONAA 36* 28* 37*  GFRAA 42* 33* 43*  PROT 8.0 7.2 7.5  ALBUMIN 3.3* 3.0* 3.3*  AST 16  15 14*  ALT 8* 8* 8*  ALKPHOS 68 69 66  BILITOT 0.3 0.4 0.2*    RADIOGRAPHIC STUDIES: I have personally reviewed the radiological images as listed and agreed with the findings in the report. Mr Jeri Cos Wo Contrast  Result Date: 11/19/2016 CLINICAL DATA:  Lung cancer staging. EXAM: MRI HEAD WITHOUT AND WITH CONTRAST TECHNIQUE: Multiplanar, multiecho pulse sequences of the brain and surrounding structures were obtained without and with intravenous contrast. CONTRAST:  8 mL MultiHance IV. COMPARISON:  None. FINDINGS: Brain: Moderate atrophy. Moderate chronic microvascular ischemic change in the white matter. Negative for acute infarct. Negative for hemorrhage or mass. Postcontrast imaging demonstrates no enhancing mass lesion. Leptomeningeal enhancement normal. Vascular: Increased signal in the periphery of the left cavernous carotid which may represent slow flow. This vessel is probably patent and does show enhancement. Otherwise normal flow voids. Skull and upper cervical spine: Negative Sinuses/Orbits: Negative Other: None IMPRESSION: Negative for metastatic disease to the brain Atrophy and moderate chronic microvascular ischemia. Asymmetric signal in the left internal carotid artery which may represent slow flow. This may be due to left carotid stenosis in the neck. Recommend carotid duplex study. Electronically Signed   By: Franchot Gallo M.D.   On: 11/19/2016 15:40   Nm Pet Image Initial (pi) Skull Base To Thigh  Result Date: 11/06/2016 CLINICAL DATA:  Initial treatment strategy for left upper lobe lung mass. EXAM: NUCLEAR MEDICINE PET SKULL BASE TO THIGH TECHNIQUE:  12.3 mCi F-18 FDG was injected intravenously. Full-ring PET imaging was performed from the skull base to thigh after the radiotracer. CT data was obtained and used for attenuation correction and anatomic localization. FASTING BLOOD GLUCOSE:  Value: 133 mg/dl COMPARISON:  Chest CT dated 10/29/2016 FINDINGS: NECK No hypermetabolic lymph nodes in the neck. CHEST 6.5 by 5.5 cm left upper lobe lung mass has a maximum SUV of 15.8, compatible with malignancy. There is some associated postobstructive atelectasis. Left paratracheal lymph node 1.5 cm in short axis on image 97/3, maximum SUV 4.0. Right lower paratracheal lymph node 0.9 cm in short axis, maximum SUV 3.4. Background mediastinal blood pool activity 2.9. Atherosclerotic calcification of the aortic arch. ABDOMEN/PELVIS Physiologic activity in the bowel, most notably the colon. No hypermetabolic lesions are identified in the liver, spleen, pancreas, or adrenal glands. No hypermetabolic adenopathy in the abdomen or pelvis. Aortoiliac atherosclerotic vascular disease. Urinary will bladder wall thickening may partially be due to nondistention. SKELETON No focal hypermetabolic activity to suggest skeletal metastasis. IMPRESSION: 1. 6.5 cm hypermetabolic left upper lobe the lung mass partially abutting the mediastinum and left anterior hilum. Probable involvement of the left lower paratracheal lymph node. Questionable involvement of the right lower paratracheal lymph node. Assuming involvement of the left but not the right paratracheal lymph nodes, and assuming non-small cell lung cancer, appearance is compatible with T2b N2 M0 disease (stage IIIa). 2.  Aortic Atherosclerosis (ICD10-I70.0). 3. Urinary bladder wall thickening may be due to nondistention, cystitis not entirely excluded. Electronically Signed   By: Van Clines M.D.   On: 11/06/2016 12:48   Dg C-arm 1-60 Min-no Report  Result Date: 11/05/2016 Fluoroscopy was utilized by the requesting physician.   No radiographic interpretation.   IMPRESSION: 1. 6.5 cm hypermetabolic left upper lobe the lung mass partially abutting the mediastinum and left anterior hilum. Probable involvement of the left lower paratracheal lymph node. Questionable involvement of the right lower paratracheal lymph node. Assuming involvement of the left but not the right paratracheal lymph nodes, and  assuming non-small cell lung cancer, appearance is compatible with T2b N2 M0 disease (stage IIIa). 2.  Aortic Atherosclerosis (ICD10-I70.0). 3. Urinary bladder wall thickening may be due to nondistention, cystitis not entirely excluded.   Electronically Signed   By: Van Clines M.D.   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  Discussed the role of chemotherapy radiation in the treatment of stage III lung cancer. Discussed that treatment goal is curative- unfortunately the chance of cure is only about 10-20%; the median survival is about 18 months.  # Carbo-Etposide- every [redacted] weeks along with radiation. Proceed with cycle #1 chemotherapy today. Labs acceptable. Recommend 4 cycles of chemotherapy.  # Diabetes- recommend checking blood sugars closely; especially on the days on chemotherapy- as I expect that the blood sugars to go up from steroids/premedications.  # Decreased appetite/weight loss- recommend referral to dietitian.  # cabo-etop/3 weeks/labs/MD; 10 days-labs only.  All questions were answered. The patient knows to call the clinic with any problems, questions or concerns.    Cammie Sickle, MD 12/04/2016 9:09 PM

## 2016-11-26 ENCOUNTER — Inpatient Hospital Stay: Payer: Medicare Other

## 2016-11-26 VITALS — BP 131/79 | HR 79 | Temp 98.5°F | Resp 16

## 2016-11-26 DIAGNOSIS — C3412 Malignant neoplasm of upper lobe, left bronchus or lung: Secondary | ICD-10-CM

## 2016-11-26 DIAGNOSIS — Z5111 Encounter for antineoplastic chemotherapy: Secondary | ICD-10-CM | POA: Diagnosis not present

## 2016-11-26 MED ORDER — DEXAMETHASONE SODIUM PHOSPHATE 10 MG/ML IJ SOLN
10.0000 mg | Freq: Once | INTRAMUSCULAR | Status: AC
  Administered 2016-11-26: 10 mg via INTRAVENOUS
  Filled 2016-11-26: qty 1

## 2016-11-26 MED ORDER — SODIUM CHLORIDE 0.9 % IV SOLN: 10.0000 mg | Freq: Once | INTRAVENOUS | Status: DC

## 2016-11-26 MED ORDER — SODIUM CHLORIDE 0.9 % IV SOLN
Freq: Once | INTRAVENOUS | Status: AC
  Administered 2016-11-26: 14:00:00 via INTRAVENOUS
  Filled 2016-11-26: qty 1000

## 2016-11-26 MED ORDER — SODIUM CHLORIDE 0.9 % IV SOLN
75.0000 mg/m2 | Freq: Once | INTRAVENOUS | Status: AC
  Administered 2016-11-26: 160 mg via INTRAVENOUS
  Filled 2016-11-26: qty 8

## 2016-11-26 MED ORDER — HEPARIN SOD (PORK) LOCK FLUSH 100 UNIT/ML IV SOLN
500.0000 [IU] | Freq: Once | INTRAVENOUS | Status: AC | PRN
  Administered 2016-11-26: 500 [IU]
  Filled 2016-11-26: qty 5

## 2016-11-26 NOTE — Progress Notes (Signed)
Nutrition Assessment   Reason for Assessment:   Weight loss  ASSESSMENT:  76 year old male with lung cancer. Past medical history COPD, DM, GERD, hep c, hypercholesterolemia, HTN.  Patient followed by Dr. Jacinto Reap.   Met with patient and wife during infusion this afternoon.  Patient reports appetite is good currently but has been taking steroids recently.  Wife reports for the last 6 months patient has been eating less (1/2 of normal portion size, 1 slice of meat on sandwich vs more previously).  Reports that typically eats grits, fish, sometimes eggs and biscuit for breakfast, lunch is usually a sandwich and supper is meat, vegetables and starch.  Has not tried oral nutrition supplements.  Reports some intolerance to milk and milk products but still eats them on occasion (milk, ice cream).    Reports some issues with constipation previously as takes lactulose at times.     Nutrition Focused Physical Exam: deferred  Medications: Vit D, glipizide, metformin, MVI, prilosec, compazine, zofran  Labs: glucose 247, BUN 37, creatinine 2.13  Anthropometrics:   Height: 74 inches Weight: 196 lb 9.6 oz on 9/19 UBW: Patient reports weight loss of 14-15 lb over the last year.  Reports was not trying to lose weight but had started walking 2 miles per day BMI: 25  7% weight loss in the last year, not significant  Estimated Energy Needs  Kcals: 2225-2600 calories/d Protein: 111-130 g/d Fluid: 2.6 L/d  NUTRITION DIAGNOSIS: Inadequate oral intake related to cancer (shortness of breath, poor appetite) as evidenced by weight loss and eating half of normal intake.   MALNUTRITION DIAGNOSIS: continue to monitor   INTERVENTION:   Discussed strategies to increase calories and protein. Fact sheet given Wife interested in foods that would help with radiation esophagitis she has been told he is at risk for that.  Discussed smooth, creamy foods and fact sheet provided (soft moist protein foods, easy to  chew and swallow foods) Discussed oral nutrition supplements and provided samples of glucerna, boost plus and ensure enlive.  Coupons given. Encouraged patient to check blood glucose on regular basis as would want him to liberalize diet if appetite is poor.  Encouraged him if blood glucose is high to have MD adjust medications not restrict foods.   Contact information given.      MONITORING, EVALUATION, GOAL: weight trends, intake   NEXT VISIT: October 11 during infusion  Brittley Regner B. Zenia Resides, [place], [place] Registered Dietitian [phone] (pager)

## 2016-11-27 ENCOUNTER — Inpatient Hospital Stay: Payer: Medicare Other

## 2016-11-27 VITALS — BP 136/71 | HR 71 | Temp 97.1°F | Resp 18

## 2016-11-27 DIAGNOSIS — C3412 Malignant neoplasm of upper lobe, left bronchus or lung: Secondary | ICD-10-CM

## 2016-11-27 DIAGNOSIS — Z5111 Encounter for antineoplastic chemotherapy: Secondary | ICD-10-CM | POA: Diagnosis not present

## 2016-11-27 MED ORDER — SODIUM CHLORIDE 0.9 % IV SOLN
75.0000 mg/m2 | Freq: Once | INTRAVENOUS | Status: AC
  Administered 2016-11-27: 160 mg via INTRAVENOUS
  Filled 2016-11-27: qty 8

## 2016-11-27 MED ORDER — HEPARIN SOD (PORK) LOCK FLUSH 100 UNIT/ML IV SOLN
500.0000 [IU] | Freq: Once | INTRAVENOUS | Status: AC | PRN
  Administered 2016-11-27: 500 [IU]
  Filled 2016-11-27: qty 5

## 2016-11-27 MED ORDER — SODIUM CHLORIDE 0.9 % IV SOLN
Freq: Once | INTRAVENOUS | Status: AC
  Administered 2016-11-27: 11:00:00 via INTRAVENOUS
  Filled 2016-11-27: qty 1000

## 2016-11-27 MED ORDER — SODIUM CHLORIDE 0.9 % IV SOLN: 10.0000 mg | Freq: Once | INTRAVENOUS | Status: DC

## 2016-11-27 MED ORDER — SODIUM CHLORIDE 0.9% FLUSH
10.0000 mL | INTRAVENOUS | Status: DC | PRN
  Administered 2016-11-27: 10 mL
  Filled 2016-11-27: qty 10

## 2016-11-27 MED ORDER — DEXAMETHASONE SODIUM PHOSPHATE 10 MG/ML IJ SOLN
10.0000 mg | Freq: Once | INTRAMUSCULAR | Status: AC
  Administered 2016-11-27: 10 mg via INTRAVENOUS
  Filled 2016-11-27: qty 1

## 2016-11-30 DIAGNOSIS — Z510 Encounter for antineoplastic radiation therapy: Secondary | ICD-10-CM | POA: Diagnosis not present

## 2016-12-02 ENCOUNTER — Ambulatory Visit
Admission: RE | Admit: 2016-12-02 | Discharge: 2016-12-02 | Disposition: A | Discharge status: Home / Self Care | Payer: Medicare Other | Source: Ambulatory Visit | Attending: Radiation Oncology | Admitting: Radiation Oncology

## 2016-12-03 ENCOUNTER — Telehealth: Payer: Self-pay | Admitting: *Deleted

## 2016-12-03 ENCOUNTER — Ambulatory Visit
Admission: RE | Admit: 2016-12-03 | Discharge: 2016-12-03 | Disposition: A | Discharge status: Home / Self Care | Payer: Medicare Other | Source: Ambulatory Visit | Attending: Radiation Oncology | Admitting: Radiation Oncology

## 2016-12-03 DIAGNOSIS — Z510 Encounter for antineoplastic radiation therapy: Secondary | ICD-10-CM | POA: Diagnosis not present

## 2016-12-03 NOTE — Telephone Encounter (Signed)
Patient called to inquire as to whether he can have the high dose flu vaccine. Please advise.

## 2016-12-04 ENCOUNTER — Inpatient Hospital Stay: Payer: Medicare Other

## 2016-12-04 ENCOUNTER — Ambulatory Visit
Admission: RE | Admit: 2016-12-04 | Discharge: 2016-12-04 | Disposition: A | Discharge status: Home / Self Care | Payer: Medicare Other | Source: Ambulatory Visit | Attending: Radiation Oncology | Admitting: Radiation Oncology

## 2016-12-04 DIAGNOSIS — Z5111 Encounter for antineoplastic chemotherapy: Secondary | ICD-10-CM | POA: Diagnosis not present

## 2016-12-04 DIAGNOSIS — Z510 Encounter for antineoplastic radiation therapy: Secondary | ICD-10-CM | POA: Diagnosis not present

## 2016-12-04 DIAGNOSIS — C3412 Malignant neoplasm of upper lobe, left bronchus or lung: Secondary | ICD-10-CM

## 2016-12-04 LAB — CBC WITH DIFFERENTIAL/PLATELET
Basophils Absolute: 0.1 10*3/uL (ref 0–0.1)
Basophils Relative: 1 %
EOS ABS: 0.2 10*3/uL (ref 0–0.7)
EOS PCT: 4 %
HCT: 30.1 % — ABNORMAL LOW (ref 40.0–52.0)
Hemoglobin: 10.2 g/dL — ABNORMAL LOW (ref 13.0–18.0)
LYMPHS ABS: 1.1 10*3/uL (ref 1.0–3.6)
LYMPHS PCT: 24 %
MCH: 28.9 pg (ref 26.0–34.0)
MCHC: 33.9 g/dL (ref 32.0–36.0)
MCV: 85.3 fL (ref 80.0–100.0)
MONO ABS: 0.2 10*3/uL (ref 0.2–1.0)
Monocytes Relative: 5 %
Neutro Abs: 3 10*3/uL (ref 1.4–6.5)
Neutrophils Relative %: 66 %
PLATELETS: 334 10*3/uL (ref 150–440)
RBC: 3.53 MIL/uL — ABNORMAL LOW (ref 4.40–5.90)
RDW: 15.2 % — AB (ref 11.5–14.5)
WBC: 4.5 10*3/uL (ref 3.8–10.6)

## 2016-12-04 LAB — COMPREHENSIVE METABOLIC PANEL
ALT: 8 U/L — AB (ref 17–63)
ANION GAP: 8 (ref 5–15)
AST: 14 U/L — ABNORMAL LOW (ref 15–41)
Albumin: 3.3 g/dL — ABNORMAL LOW (ref 3.5–5.0)
Alkaline Phosphatase: 66 U/L (ref 38–126)
BUN: 34 mg/dL — ABNORMAL HIGH (ref 6–20)
CO2: 23 mmol/L (ref 22–32)
CREATININE: 1.71 mg/dL — AB (ref 0.61–1.24)
Calcium: 8.9 mg/dL (ref 8.9–10.3)
Chloride: 102 mmol/L (ref 101–111)
GFR, EST AFRICAN AMERICAN: 43 mL/min — AB (ref >60)
GFR, EST NON AFRICAN AMERICAN: 37 mL/min — AB (ref >60)
Glucose, Bld: 173 mg/dL — ABNORMAL HIGH (ref 65–99)
POTASSIUM: 4.8 mmol/L (ref 3.5–5.1)
Sodium: 133 mmol/L — ABNORMAL LOW (ref 135–145)
Total Bilirubin: 0.2 mg/dL — ABNORMAL LOW (ref 0.3–1.2)
Total Protein: 7.5 g/dL (ref 6.5–8.1)

## 2016-12-04 NOTE — Telephone Encounter (Signed)
md approved high dose flu vaccine

## 2016-12-04 NOTE — Telephone Encounter (Signed)
Patient notified-he stated that he will get his vaccine at pcp office.

## 2016-12-07 ENCOUNTER — Ambulatory Visit
Admission: RE | Admit: 2016-12-07 | Discharge: 2016-12-07 | Disposition: A | Discharge status: Home / Self Care | Payer: Medicare Other | Source: Ambulatory Visit | Attending: Radiation Oncology | Admitting: Radiation Oncology

## 2016-12-07 DIAGNOSIS — Z510 Encounter for antineoplastic radiation therapy: Secondary | ICD-10-CM | POA: Diagnosis not present

## 2016-12-08 ENCOUNTER — Ambulatory Visit
Admission: RE | Admit: 2016-12-08 | Discharge: 2016-12-08 | Disposition: A | Discharge status: Home / Self Care | Payer: Medicare Other | Source: Ambulatory Visit | Attending: Radiation Oncology | Admitting: Radiation Oncology

## 2016-12-08 DIAGNOSIS — Z510 Encounter for antineoplastic radiation therapy: Secondary | ICD-10-CM | POA: Diagnosis not present

## 2016-12-09 ENCOUNTER — Ambulatory Visit
Admission: RE | Admit: 2016-12-09 | Discharge: 2016-12-09 | Disposition: A | Discharge status: Home / Self Care | Payer: Medicare Other | Source: Ambulatory Visit | Attending: Radiation Oncology | Admitting: Radiation Oncology

## 2016-12-09 DIAGNOSIS — Z510 Encounter for antineoplastic radiation therapy: Secondary | ICD-10-CM | POA: Diagnosis not present

## 2016-12-10 ENCOUNTER — Ambulatory Visit
Admission: RE | Admit: 2016-12-10 | Discharge: 2016-12-10 | Disposition: A | Discharge status: Home / Self Care | Payer: Medicare Other | Source: Ambulatory Visit | Attending: Radiation Oncology | Admitting: Radiation Oncology

## 2016-12-10 DIAGNOSIS — Z510 Encounter for antineoplastic radiation therapy: Secondary | ICD-10-CM | POA: Diagnosis not present

## 2016-12-11 ENCOUNTER — Ambulatory Visit
Admission: RE | Admit: 2016-12-11 | Discharge: 2016-12-11 | Disposition: A | Discharge status: Home / Self Care | Payer: Medicare Other | Source: Ambulatory Visit | Attending: Radiation Oncology | Admitting: Radiation Oncology

## 2016-12-11 DIAGNOSIS — Z510 Encounter for antineoplastic radiation therapy: Secondary | ICD-10-CM | POA: Diagnosis not present

## 2016-12-14 ENCOUNTER — Ambulatory Visit
Admission: RE | Admit: 2016-12-14 | Discharge: 2016-12-14 | Disposition: A | Discharge status: Home / Self Care | Payer: Medicare Other | Source: Ambulatory Visit | Attending: Radiation Oncology | Admitting: Radiation Oncology

## 2016-12-14 DIAGNOSIS — Z510 Encounter for antineoplastic radiation therapy: Secondary | ICD-10-CM | POA: Diagnosis not present

## 2016-12-15 ENCOUNTER — Ambulatory Visit
Admission: RE | Admit: 2016-12-15 | Discharge: 2016-12-15 | Disposition: A | Discharge status: Home / Self Care | Payer: Medicare Other | Source: Ambulatory Visit | Attending: Radiation Oncology | Admitting: Radiation Oncology

## 2016-12-15 ENCOUNTER — Other Ambulatory Visit: Payer: Self-pay | Admitting: *Deleted

## 2016-12-15 DIAGNOSIS — Z510 Encounter for antineoplastic radiation therapy: Secondary | ICD-10-CM | POA: Diagnosis not present

## 2016-12-15 MED ORDER — SUCRALFATE 1 G PO TABS: 1.0000 g | ORAL_TABLET | Freq: Three times a day (TID) | ORAL | 6 refills | Status: DC

## 2016-12-16 ENCOUNTER — Inpatient Hospital Stay: Payer: Medicare Other | Attending: Internal Medicine | Admitting: Internal Medicine

## 2016-12-16 ENCOUNTER — Inpatient Hospital Stay: Payer: Medicare Other

## 2016-12-16 ENCOUNTER — Ambulatory Visit
Admission: RE | Admit: 2016-12-16 | Discharge: 2016-12-16 | Disposition: A | Discharge status: Home / Self Care | Payer: Medicare Other | Source: Ambulatory Visit | Attending: Radiation Oncology | Admitting: Radiation Oncology

## 2016-12-16 VITALS — BP 138/81 | HR 69 | Temp 97.6°F | Wt 198.0 lb

## 2016-12-16 DIAGNOSIS — K5900 Constipation, unspecified: Secondary | ICD-10-CM | POA: Diagnosis not present

## 2016-12-16 DIAGNOSIS — D6481 Anemia due to antineoplastic chemotherapy: Secondary | ICD-10-CM | POA: Diagnosis not present

## 2016-12-16 DIAGNOSIS — B1920 Unspecified viral hepatitis C without hepatic coma: Secondary | ICD-10-CM | POA: Diagnosis not present

## 2016-12-16 DIAGNOSIS — Z5111 Encounter for antineoplastic chemotherapy: Secondary | ICD-10-CM | POA: Diagnosis not present

## 2016-12-16 DIAGNOSIS — R634 Abnormal weight loss: Secondary | ICD-10-CM | POA: Insufficient documentation

## 2016-12-16 DIAGNOSIS — C3412 Malignant neoplasm of upper lobe, left bronchus or lung: Secondary | ICD-10-CM | POA: Diagnosis not present

## 2016-12-16 DIAGNOSIS — I129 Hypertensive chronic kidney disease with stage 1 through stage 4 chronic kidney disease, or unspecified chronic kidney disease: Secondary | ICD-10-CM | POA: Insufficient documentation

## 2016-12-16 DIAGNOSIS — R630 Anorexia: Secondary | ICD-10-CM | POA: Diagnosis not present

## 2016-12-16 DIAGNOSIS — Z800 Family history of malignant neoplasm of digestive organs: Secondary | ICD-10-CM | POA: Insufficient documentation

## 2016-12-16 DIAGNOSIS — E7800 Pure hypercholesterolemia, unspecified: Secondary | ICD-10-CM | POA: Insufficient documentation

## 2016-12-16 DIAGNOSIS — G8929 Other chronic pain: Secondary | ICD-10-CM | POA: Insufficient documentation

## 2016-12-16 DIAGNOSIS — M1990 Unspecified osteoarthritis, unspecified site: Secondary | ICD-10-CM | POA: Insufficient documentation

## 2016-12-16 DIAGNOSIS — R531 Weakness: Secondary | ICD-10-CM | POA: Insufficient documentation

## 2016-12-16 DIAGNOSIS — R0981 Nasal congestion: Secondary | ICD-10-CM | POA: Insufficient documentation

## 2016-12-16 DIAGNOSIS — E1122 Type 2 diabetes mellitus with diabetic chronic kidney disease: Secondary | ICD-10-CM | POA: Diagnosis not present

## 2016-12-16 DIAGNOSIS — D701 Agranulocytosis secondary to cancer chemotherapy: Secondary | ICD-10-CM | POA: Diagnosis not present

## 2016-12-16 DIAGNOSIS — Z7984 Long term (current) use of oral hypoglycemic drugs: Secondary | ICD-10-CM | POA: Insufficient documentation

## 2016-12-16 DIAGNOSIS — J449 Chronic obstructive pulmonary disease, unspecified: Secondary | ICD-10-CM | POA: Insufficient documentation

## 2016-12-16 DIAGNOSIS — T451X5S Adverse effect of antineoplastic and immunosuppressive drugs, sequela: Secondary | ICD-10-CM | POA: Diagnosis not present

## 2016-12-16 DIAGNOSIS — N183 Chronic kidney disease, stage 3 (moderate): Secondary | ICD-10-CM | POA: Insufficient documentation

## 2016-12-16 DIAGNOSIS — Z880 Allergy status to penicillin: Secondary | ICD-10-CM | POA: Insufficient documentation

## 2016-12-16 DIAGNOSIS — I700 Atherosclerosis of aorta: Secondary | ICD-10-CM | POA: Insufficient documentation

## 2016-12-16 DIAGNOSIS — Z801 Family history of malignant neoplasm of trachea, bronchus and lung: Secondary | ICD-10-CM | POA: Insufficient documentation

## 2016-12-16 DIAGNOSIS — Z79899 Other long term (current) drug therapy: Secondary | ICD-10-CM

## 2016-12-16 DIAGNOSIS — Z7982 Long term (current) use of aspirin: Secondary | ICD-10-CM | POA: Insufficient documentation

## 2016-12-16 DIAGNOSIS — K219 Gastro-esophageal reflux disease without esophagitis: Secondary | ICD-10-CM | POA: Diagnosis not present

## 2016-12-16 DIAGNOSIS — D631 Anemia in chronic kidney disease: Secondary | ICD-10-CM | POA: Diagnosis not present

## 2016-12-16 DIAGNOSIS — Z87891 Personal history of nicotine dependence: Secondary | ICD-10-CM | POA: Insufficient documentation

## 2016-12-16 DIAGNOSIS — Z510 Encounter for antineoplastic radiation therapy: Secondary | ICD-10-CM | POA: Diagnosis not present

## 2016-12-16 DIAGNOSIS — Z808 Family history of malignant neoplasm of other organs or systems: Secondary | ICD-10-CM | POA: Insufficient documentation

## 2016-12-16 LAB — CBC WITH DIFFERENTIAL/PLATELET
BASOS ABS: 0 10*3/uL (ref 0–0.1)
Basophils Relative: 2 %
EOS PCT: 4 %
Eosinophils Absolute: 0.1 10*3/uL (ref 0–0.7)
HCT: 30.6 % — ABNORMAL LOW (ref 40.0–52.0)
Hemoglobin: 10 g/dL — ABNORMAL LOW (ref 13.0–18.0)
LYMPHS ABS: 0.7 10*3/uL — AB (ref 1.0–3.6)
LYMPHS PCT: 34 %
MCH: 28.6 pg (ref 26.0–34.0)
MCHC: 32.6 g/dL (ref 32.0–36.0)
MCV: 87.7 fL (ref 80.0–100.0)
MONO ABS: 0.5 10*3/uL (ref 0.2–1.0)
Monocytes Relative: 23 %
Neutro Abs: 0.8 10*3/uL — ABNORMAL LOW (ref 1.4–6.5)
Neutrophils Relative %: 37 %
PLATELETS: 279 10*3/uL (ref 150–440)
RBC: 3.49 MIL/uL — ABNORMAL LOW (ref 4.40–5.90)
RDW: 16.7 % — AB (ref 11.5–14.5)
WBC: 2.1 10*3/uL — ABNORMAL LOW (ref 3.8–10.6)

## 2016-12-16 LAB — COMPREHENSIVE METABOLIC PANEL
ALT: 8 U/L — ABNORMAL LOW (ref 17–63)
ANION GAP: 8 (ref 5–15)
AST: 15 U/L (ref 15–41)
Albumin: 3.3 g/dL — ABNORMAL LOW (ref 3.5–5.0)
Alkaline Phosphatase: 71 U/L (ref 38–126)
BUN: 25 mg/dL — ABNORMAL HIGH (ref 6–20)
CALCIUM: 8.8 mg/dL — AB (ref 8.9–10.3)
CO2: 23 mmol/L (ref 22–32)
CREATININE: 1.99 mg/dL — AB (ref 0.61–1.24)
Chloride: 104 mmol/L (ref 101–111)
GFR, EST AFRICAN AMERICAN: 36 mL/min — AB (ref >60)
GFR, EST NON AFRICAN AMERICAN: 31 mL/min — AB (ref >60)
GLUCOSE: 135 mg/dL — AB (ref 65–99)
Potassium: 4.6 mmol/L (ref 3.5–5.1)
Sodium: 135 mmol/L (ref 135–145)
TOTAL PROTEIN: 7.3 g/dL (ref 6.5–8.1)
Total Bilirubin: 0.3 mg/dL (ref 0.3–1.2)

## 2016-12-16 MED ORDER — GLUCAGON (RDNA) 1 MG IJ KIT: 1.0000 mg | PACK | Freq: Once | INTRAMUSCULAR | 12 refills | Status: DC | PRN

## 2016-12-16 MED ORDER — HEPARIN SOD (PORK) LOCK FLUSH 100 UNIT/ML IV SOLN
500.0000 [IU] | Freq: Once | INTRAVENOUS | Status: AC
  Administered 2016-12-16: 500 [IU] via INTRAVENOUS
  Filled 2016-12-16: qty 5

## 2016-12-16 NOTE — Assessment & Plan Note (Addendum)
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 patient status post cycle #1 approximately 3 weeks ago. Tolerating chemotherapy well except for- severe neutropenia.   # HOLD chemo cycle #2 today; ANC -800; hb-10; platelets- N.   # constipation- on miralax; better.   # Diabetes- brittle; recommend close monitoring. recommend glucagon I mg  IM as needed.   # Decreased appetite/weight loss-status post dietitian referral.   #  1 week-labs-chemo; 10 days-labs/MD 26th.

## 2016-12-16 NOTE — Progress Notes (Signed)
[place] NOTE  Patient Care Team: Marguerita Merles, MD as PCP - General (Family Medicine) Telford Nab, RN as Registered Nurse  CHIEF COMPLAINTS/PURPOSE OF CONSULTATION: LEFT LUNG MASS     Oncology History   # AUG 2018- LIMITED STAGE SMALL CELL LUNG CA ;[ T4N3M0] LEFT UPPER LUNG MASS- ~5-5.5cm; positive for M-LN. PET 2018- no distant mets.   # SEP 19th 2018- CARBO-ETOP   # Sep 2018-MRI brain-NEG  # Hep C [? Immunization in [employer]; s/p Harvoni at [place]; 2017]; CKD Stage III     Cancer of upper lobe of left lung (HCC)   HISTORY OF PRESENTING ILLNESS:  Edwin Jones 76 y.o.  male recently diagnosed Limited stage small cell lung cancer- currently on Carbo etoposide along with radiation.  Denies any worsening shortness of breath or new onset of cough. No hemoptysis. Denies any headaches.Otherwise his appetite is fair. Denies any falls. Denies any bone pain. Has chronic arthritic pain. Complains of mild generalized weakness. Otherwise no fevers or chills. Complains of intermittent constipation. As per the wife patient has blood sugars between "60-250s".   ROS: A complete 10 point review of system is done which is negative except mentioned above in history of present illness  MEDICAL HISTORY:  Past Medical History:  Diagnosis Date  . COPD (chronic obstructive pulmonary disease) ([place])   . Diabetes mellitus without complication ([place])   . GERD without esophagitis   . Hepatitis C virus    history of hep C treated with Harvoni  . Hypercholesterolemia   . Hypertension   . Seasonal allergies     SURGICAL HISTORY: Past Surgical History:  Procedure Laterality Date  . COLONOSCOPY     approximately 11 years ago  . FLEXIBLE BRONCHOSCOPY N/A 11/05/2016   Procedure: FLEXIBLE BRONCHOSCOPY;  Surgeon: Wilhelmina Mcardle, MD;  Location: [facility] ORS;  Service: Pulmonary;  Laterality: N/A;  . PORTA CATH INSERTION N/A 11/23/2016   Procedure: Glori Luis Cath Insertion;  Surgeon:  Algernon Huxley, MD;  Location: [facility];  Service: Cardiovascular;  Laterality: N/A;  . TONSILLECTOMY      SOCIAL HISTORY: Social History   Social History  . Marital status: Married    Spouse name: N/A  . Number of children: N/A  . Years of education: N/A   Occupational History  . Not on file.   Social History Main Topics  . Smoking status: Former Smoker    Packs/day: 1.00    Years: 50.00    Types: Cigarettes    Quit date: 04/07/2004  . Smokeless tobacco: Never Used  . Alcohol use No     Comment: history of alcohol use  . Drug use: No  . Sexual activity: Yes   Other Topics Concern  . Not on file   Social History Narrative  . No narrative on file    FAMILY HISTORY: Family History  Problem Relation Age of Onset  . Skin cancer Mother   . Ovarian cancer Sister        sister was diagnosed as a teenager  . Throat cancer Brother 42       brother #1  . Lung cancer Sister 3  . Throat cancer Brother 80       brother #2  . Skin cancer Sister     ALLERGIES:  is allergic to penicillin g; shellfish-derived products; statins; erythromycin; tamsulosin; tuberculin ppd; and iodinated diagnostic agents.  MEDICATIONS:  Current Outpatient Prescriptions  Medication Sig Dispense Refill  .  APPLE CIDER VINEGAR PO Take 450 mg by mouth at bedtime.     Marland Kitchen aspirin EC 81 MG tablet Take 81 mg by mouth at bedtime.     . cetirizine (ZYRTEC) 10 MG tablet Take 10 mg by mouth daily.    . cholecalciferol (VITAMIN D) 1000 units tablet Take 1,000 Units by mouth daily.    . Coenzyme Q10 100 MG capsule Take 100 mg by mouth daily.     . Flaxseed, Linseed, (FLAXSEED OIL) 1000 MG CAPS Take 1,000 mg by mouth at bedtime.     . fluticasone (FLONASE) 50 MCG/ACT nasal spray Place 1 spray into both nostrils daily as needed for allergies or rhinitis.    Marland Kitchen glipiZIDE (GLUCOTROL) 5 MG tablet Take 5 mg by mouth 2 (two) times daily.    Marland Kitchen GNP GARLIC EXTRACT PO Take 8,676 mg by mouth daily.     .  hydrochlorothiazide (HYDRODIURIL) 25 MG tablet Take 25 mg by mouth daily.    Marland Kitchen lidocaine-prilocaine (EMLA) cream Apply 1 application topically as needed. 30 g 3  . lisinopril (PRINIVIL,ZESTRIL) 10 MG tablet Take 10 mg by mouth daily.    . metFORMIN (GLUCOPHAGE) 1000 MG tablet Take 1,000 mg by mouth 2 (two) times daily with a meal.     . Multiple Vitamin (MULTIVITAMIN WITH MINERALS) TABS tablet Take 1 tablet by mouth daily.    Marland Kitchen omeprazole (PRILOSEC) 20 MG capsule Take 20 mg by mouth every Monday, Wednesday, and Friday.     . sucralfate (CARAFATE) 1 g tablet Take 1 tablet (1 g total) by mouth 3 (three) times daily before meals. 90 tablet 6  . glucagon (GLUCAGON EMERGENCY) 1 MG injection Inject 1 mg into the muscle once as needed. When blood glucose drops less than 50; and if patient has difficulty drinking. 1 each 12  . ondansetron (ZOFRAN) 8 MG tablet Take 1 tablet (8 mg total) by mouth every 8 (eight) hours as needed for nausea or vomiting (start 3 days; after chemo). (Patient not taking: Reported on 12/16/2016) 40 tablet 1  . prochlorperazine (COMPAZINE) 10 MG tablet Take 1 tablet (10 mg total) by mouth every 6 (six) hours as needed for nausea or vomiting. (Patient not taking: Reported on 12/16/2016) 40 tablet 1   No current facility-administered medications for this visit.       Marland Kitchen  PHYSICAL EXAMINATION: ECOG PERFORMANCE STATUS: 1 - Symptomatic but completely ambulatory  Vitals:   12/16/16 1026  BP: 138/81  Pulse: 69  Temp: 97.6 F (36.4 C)   Filed Weights   12/16/16 1026  Weight: 198 lb (89.8 kg)    GENERAL: Well-nourished well-developed; Alert, no distress and comfortable.   With his wife.  EYES: no pallor or icterus OROPHARYNX: no thrush or ulceration; dentures.   NECK: supple, no masses felt LYMPH:  no palpable lymphadenopathy in the cervical, axillary or inguinal regions LUNGS: clear to auscultation and  No wheeze or crackles HEART/CVS: regular rate & rhythm and no  murmurs; No lower extremity edema ABDOMEN: abdomen soft, non-tender and normal bowel sounds Musculoskeletal:no cyanosis of digits and no clubbing  PSYCH: alert & oriented x 3 with fluent speech NEURO: no focal motor/sensory deficits SKIN:  no rashes or significant lesions  LABORATORY DATA:  I have reviewed the data as listed Lab Results  Component Value Date   WBC 2.1 (L) 12/16/2016   HGB 10.0 (L) 12/16/2016   HCT 30.6 (L) 12/16/2016   MCV 87.7 12/16/2016   PLT 279 12/16/2016  Recent Labs  11/25/16 0812 12/04/16 1104 12/16/16 0937  NA 134* 133* 135  K 4.7 4.8 4.6  CL 103 102 104  CO2 _0 GLUCOSE 247* 173* 135*  BUN 37* 34* 25*  CREATININE 2.13* 1.71* 1.99*  CALCIUM 9.0 8.9 8.8*  GFRNONAA 28* 37* 31*  GFRAA 33* 43* 36*  PROT 7.2 7.5 7.3  ALBUMIN 3.0* 3.3* 3.3*  AST 15 14* 15  ALT 8* 8* 8*  ALKPHOS 69 66 71  BILITOT 0.4 0.2* 0.3    RADIOGRAPHIC STUDIES: I have personally reviewed the radiological images as listed and agreed with the findings in the report. Mr Jeri Cos Wo Contrast  Result Date: 11/19/2016 CLINICAL DATA:  Lung cancer staging. EXAM: MRI HEAD WITHOUT AND WITH CONTRAST TECHNIQUE: Multiplanar, multiecho pulse sequences of the brain and surrounding structures were obtained without and with intravenous contrast. CONTRAST:  8 mL MultiHance IV. COMPARISON:  None. FINDINGS: Brain: Moderate atrophy. Moderate chronic microvascular ischemic change in the white matter. Negative for acute infarct. Negative for hemorrhage or mass. Postcontrast imaging demonstrates no enhancing mass lesion. Leptomeningeal enhancement normal. Vascular: Increased signal in the periphery of the left cavernous carotid which may represent slow flow. This vessel is probably patent and does show enhancement. Otherwise normal flow voids. Skull and upper cervical spine: Negative Sinuses/Orbits: Negative Other: None IMPRESSION: Negative for metastatic disease to the brain Atrophy and moderate  chronic microvascular ischemia. Asymmetric signal in the left internal carotid artery which may represent slow flow. This may be due to left carotid stenosis in the neck. Recommend carotid duplex study. Electronically Signed   By: Franchot Gallo M.D.   On: 11/19/2016 15:40   IMPRESSION: 1. 6.5 cm hypermetabolic left upper lobe the lung mass partially abutting the mediastinum and left anterior hilum. Probable involvement of the left lower paratracheal lymph node. Questionable involvement of the right lower paratracheal lymph node. Assuming involvement of the left but not the right paratracheal lymph nodes, and assuming non-small cell lung cancer, appearance is compatible with T2b N2 M0 disease (stage IIIa). 2.  Aortic Atherosclerosis (ICD10-I70.0). 3. Urinary bladder wall thickening may be due to nondistention, cystitis not entirely excluded.   Electronically Signed   By: Van Clines M.D.   On: 11/06/2016 12:48  ASSESSMENT & PLAN:   Cancer of upper lobe of left lung ([place]) # SMALL CELL LUNG CA  LIMITED STAGE- left upper lobe- on concurrent carbo [09/19]-Etop with RT [started 27th sep]. patient status post cycle #1 approximately 3 weeks ago. Tolerating chemotherapy well except for- severe neutropenia.   # HOLD chemo cycle #2 today; ANC -800; hb-10; platelets- N.   # constipation- on miralax; better.   # Diabetes- brittle; recommend close monitoring. recommend glucagon I mg  IM as needed.   # Decreased appetite/weight loss-status post dietitian referral.   #  1 week-labs-chemo; 10 days-labs/MD 26th.   All questions were answered. The patient knows to call the clinic with any problems, questions or concerns.    Cammie Sickle, MD 12/16/2016 1:33 PM

## 2016-12-17 ENCOUNTER — Ambulatory Visit
Admission: RE | Admit: 2016-12-17 | Discharge: 2016-12-17 | Disposition: A | Discharge status: Home / Self Care | Payer: Medicare Other | Source: Ambulatory Visit | Attending: Radiation Oncology | Admitting: Radiation Oncology

## 2016-12-17 ENCOUNTER — Inpatient Hospital Stay: Payer: Medicare Other

## 2016-12-17 DIAGNOSIS — Z510 Encounter for antineoplastic radiation therapy: Secondary | ICD-10-CM | POA: Diagnosis not present

## 2016-12-17 NOTE — Progress Notes (Signed)
Nutrition Follow-up:  Patient with lung cancer currently receiving chemotherapy and radiation therapy.    Met with patient following radiation today.  Patient reports appetite is good. Reports eating good sources of protein (chicken, fish, tuna, eggs, nuts and peanut butter).  Also eating fruits and vegetables and grains.  Has tried oral nutrition supplements that were given on last visit and liked them but currently not drinking them as eating well.    No nutrition impact symptoms reported today, some constipation during chemo but resolved with miralax.   Medications: reviewed  Labs: reviewed  Anthropometrics:   Weight has increased to 198 lb on 10/10 from 196 lb on 9/20   NUTRITION DIAGNOSIS: Inadequate oral intake improved   MALNUTRITION DIAGNOSIS: continue to monitor    INTERVENTION:  Encouraged patient to continue eating a well balanced diet. Reviewed good sources of protein Oral nutrition supplements can be used if intake decreases. Encouraged patient to check blood glucose on regular basis and to contact MD if blood glucose levels out of range.     MONITORING, EVALUATION, GOAL: weight trends, intake   NEXT VISIT: as needed  Dalana Pfahler B. Zenia Resides, [place], [place] Registered Dietitian [phone] (pager)

## 2016-12-18 ENCOUNTER — Inpatient Hospital Stay: Payer: Medicare Other

## 2016-12-18 ENCOUNTER — Ambulatory Visit
Admission: RE | Admit: 2016-12-18 | Discharge: 2016-12-18 | Disposition: A | Discharge status: Home / Self Care | Payer: Medicare Other | Source: Ambulatory Visit | Attending: Radiation Oncology | Admitting: Radiation Oncology

## 2016-12-18 DIAGNOSIS — Z510 Encounter for antineoplastic radiation therapy: Secondary | ICD-10-CM | POA: Diagnosis not present

## 2016-12-21 ENCOUNTER — Ambulatory Visit
Admission: RE | Admit: 2016-12-21 | Discharge: 2016-12-21 | Disposition: A | Discharge status: Home / Self Care | Payer: Medicare Other | Source: Ambulatory Visit | Attending: Radiation Oncology | Admitting: Radiation Oncology

## 2016-12-21 DIAGNOSIS — Z510 Encounter for antineoplastic radiation therapy: Secondary | ICD-10-CM | POA: Diagnosis not present

## 2016-12-22 ENCOUNTER — Ambulatory Visit
Admission: RE | Admit: 2016-12-22 | Discharge: 2016-12-22 | Disposition: A | Discharge status: Home / Self Care | Payer: Medicare Other | Source: Ambulatory Visit | Attending: Radiation Oncology | Admitting: Radiation Oncology

## 2016-12-22 DIAGNOSIS — Z510 Encounter for antineoplastic radiation therapy: Secondary | ICD-10-CM | POA: Diagnosis not present

## 2016-12-23 ENCOUNTER — Inpatient Hospital Stay: Payer: Medicare Other

## 2016-12-23 ENCOUNTER — Inpatient Hospital Stay (HOSPITAL_BASED_OUTPATIENT_CLINIC_OR_DEPARTMENT_OTHER): Payer: Medicare Other | Admitting: Internal Medicine

## 2016-12-23 ENCOUNTER — Ambulatory Visit
Admission: RE | Admit: 2016-12-23 | Discharge: 2016-12-23 | Disposition: A | Discharge status: Home / Self Care | Payer: Medicare Other | Source: Ambulatory Visit | Attending: Radiation Oncology | Admitting: Radiation Oncology

## 2016-12-23 VITALS — BP 129/80 | HR 66 | Resp 18

## 2016-12-23 VITALS — BP 152/85 | HR 71 | Temp 95.5°F | Resp 18 | Wt 200.6 lb

## 2016-12-23 DIAGNOSIS — E7800 Pure hypercholesterolemia, unspecified: Secondary | ICD-10-CM

## 2016-12-23 DIAGNOSIS — D6481 Anemia due to antineoplastic chemotherapy: Secondary | ICD-10-CM

## 2016-12-23 DIAGNOSIS — C3412 Malignant neoplasm of upper lobe, left bronchus or lung: Secondary | ICD-10-CM | POA: Diagnosis not present

## 2016-12-23 DIAGNOSIS — D631 Anemia in chronic kidney disease: Secondary | ICD-10-CM

## 2016-12-23 DIAGNOSIS — N183 Chronic kidney disease, stage 3 (moderate): Secondary | ICD-10-CM | POA: Diagnosis not present

## 2016-12-23 DIAGNOSIS — T451X5S Adverse effect of antineoplastic and immunosuppressive drugs, sequela: Secondary | ICD-10-CM

## 2016-12-23 DIAGNOSIS — B1920 Unspecified viral hepatitis C without hepatic coma: Secondary | ICD-10-CM | POA: Diagnosis not present

## 2016-12-23 DIAGNOSIS — Z7984 Long term (current) use of oral hypoglycemic drugs: Secondary | ICD-10-CM

## 2016-12-23 DIAGNOSIS — K219 Gastro-esophageal reflux disease without esophagitis: Secondary | ICD-10-CM

## 2016-12-23 DIAGNOSIS — M1990 Unspecified osteoarthritis, unspecified site: Secondary | ICD-10-CM

## 2016-12-23 DIAGNOSIS — I129 Hypertensive chronic kidney disease with stage 1 through stage 4 chronic kidney disease, or unspecified chronic kidney disease: Secondary | ICD-10-CM

## 2016-12-23 DIAGNOSIS — D701 Agranulocytosis secondary to cancer chemotherapy: Secondary | ICD-10-CM | POA: Diagnosis not present

## 2016-12-23 DIAGNOSIS — K5900 Constipation, unspecified: Secondary | ICD-10-CM

## 2016-12-23 DIAGNOSIS — Z87891 Personal history of nicotine dependence: Secondary | ICD-10-CM

## 2016-12-23 DIAGNOSIS — E1122 Type 2 diabetes mellitus with diabetic chronic kidney disease: Secondary | ICD-10-CM

## 2016-12-23 DIAGNOSIS — Z7982 Long term (current) use of aspirin: Secondary | ICD-10-CM

## 2016-12-23 DIAGNOSIS — J449 Chronic obstructive pulmonary disease, unspecified: Secondary | ICD-10-CM

## 2016-12-23 DIAGNOSIS — Z800 Family history of malignant neoplasm of digestive organs: Secondary | ICD-10-CM

## 2016-12-23 DIAGNOSIS — Z880 Allergy status to penicillin: Secondary | ICD-10-CM

## 2016-12-23 DIAGNOSIS — Z79899 Other long term (current) drug therapy: Secondary | ICD-10-CM

## 2016-12-23 DIAGNOSIS — I700 Atherosclerosis of aorta: Secondary | ICD-10-CM

## 2016-12-23 DIAGNOSIS — Z510 Encounter for antineoplastic radiation therapy: Secondary | ICD-10-CM | POA: Diagnosis not present

## 2016-12-23 DIAGNOSIS — G8929 Other chronic pain: Secondary | ICD-10-CM

## 2016-12-23 DIAGNOSIS — Z801 Family history of malignant neoplasm of trachea, bronchus and lung: Secondary | ICD-10-CM

## 2016-12-23 DIAGNOSIS — Z5111 Encounter for antineoplastic chemotherapy: Secondary | ICD-10-CM | POA: Diagnosis not present

## 2016-12-23 LAB — CBC WITH DIFFERENTIAL/PLATELET
Basophils Absolute: 0.1 10*3/uL (ref 0–0.1)
Basophils Relative: 2 %
EOS ABS: 0.1 10*3/uL (ref 0–0.7)
Eosinophils Relative: 4 %
HCT: 32 % — ABNORMAL LOW (ref 40.0–52.0)
HEMOGLOBIN: 10.5 g/dL — AB (ref 13.0–18.0)
LYMPHS ABS: 0.6 10*3/uL — AB (ref 1.0–3.6)
Lymphocytes Relative: 18 %
MCH: 29 pg (ref 26.0–34.0)
MCHC: 32.8 g/dL (ref 32.0–36.0)
MCV: 88.3 fL (ref 80.0–100.0)
MONO ABS: 0.5 10*3/uL (ref 0.2–1.0)
MONOS PCT: 16 %
NEUTROS PCT: 60 %
Neutro Abs: 2.1 10*3/uL (ref 1.4–6.5)
Platelets: 272 10*3/uL (ref 150–440)
RBC: 3.63 MIL/uL — ABNORMAL LOW (ref 4.40–5.90)
RDW: 17.3 % — ABNORMAL HIGH (ref 11.5–14.5)
WBC: 3.4 10*3/uL — ABNORMAL LOW (ref 3.8–10.6)

## 2016-12-23 LAB — BASIC METABOLIC PANEL
ANION GAP: 9 (ref 5–15)
BUN: 30 mg/dL — ABNORMAL HIGH (ref 6–20)
CALCIUM: 9.1 mg/dL (ref 8.9–10.3)
CO2: 23 mmol/L (ref 22–32)
Chloride: 102 mmol/L (ref 101–111)
Creatinine, Ser: 1.91 mg/dL — ABNORMAL HIGH (ref 0.61–1.24)
GFR, EST AFRICAN AMERICAN: 38 mL/min — AB (ref >60)
GFR, EST NON AFRICAN AMERICAN: 32 mL/min — AB (ref >60)
Glucose, Bld: 204 mg/dL — ABNORMAL HIGH (ref 65–99)
POTASSIUM: 4.7 mmol/L (ref 3.5–5.1)
SODIUM: 134 mmol/L — AB (ref 135–145)

## 2016-12-23 MED ORDER — SODIUM CHLORIDE 0.9 % IV SOLN
Freq: Once | INTRAVENOUS | Status: AC
  Administered 2016-12-23: 11:00:00 via INTRAVENOUS
  Filled 2016-12-23: qty 1000

## 2016-12-23 MED ORDER — PALONOSETRON HCL INJECTION 0.25 MG/5ML
0.2500 mg | Freq: Once | INTRAVENOUS | Status: AC
  Administered 2016-12-23: 0.25 mg via INTRAVENOUS
  Filled 2016-12-23: qty 5

## 2016-12-23 MED ORDER — DEXAMETHASONE SODIUM PHOSPHATE 10 MG/ML IJ SOLN
10.0000 mg | Freq: Once | INTRAMUSCULAR | Status: AC
  Administered 2016-12-23: 10 mg via INTRAVENOUS
  Filled 2016-12-23: qty 1

## 2016-12-23 MED ORDER — SODIUM CHLORIDE 0.9 % IV SOLN
75.0000 mg/m2 | Freq: Once | INTRAVENOUS | Status: AC
  Administered 2016-12-23: 160 mg via INTRAVENOUS
  Filled 2016-12-23: qty 8

## 2016-12-23 MED ORDER — SODIUM CHLORIDE 0.9% FLUSH
10.0000 mL | INTRAVENOUS | Status: DC | PRN
  Administered 2016-12-23: 10 mL via INTRAVENOUS
  Filled 2016-12-23: qty 10

## 2016-12-23 MED ORDER — HEPARIN SOD (PORK) LOCK FLUSH 100 UNIT/ML IV SOLN
500.0000 [IU] | Freq: Once | INTRAVENOUS | Status: AC
  Administered 2016-12-23: 500 [IU] via INTRAVENOUS
  Filled 2016-12-23: qty 5

## 2016-12-23 MED ORDER — SODIUM CHLORIDE 0.9 % IV SOLN
310.0000 mg | Freq: Once | INTRAVENOUS | Status: AC
  Administered 2016-12-23: 310 mg via INTRAVENOUS
  Filled 2016-12-23: qty 31

## 2016-12-23 MED ORDER — SODIUM CHLORIDE 0.9 % IV SOLN: 10.0000 mg | Freq: Once | INTRAVENOUS | Status: DC

## 2016-12-23 NOTE — Progress Notes (Signed)
[place] NOTE  Patient Care Team: Marguerita Merles, MD as PCP - General (Family Medicine) Telford Nab, RN as Registered Nurse  CHIEF COMPLAINTS/PURPOSE OF CONSULTATION: LEFT LUNG MASS     Oncology History   # AUG 2018- LIMITED STAGE SMALL CELL LUNG CA ;[ T4N3M0] LEFT UPPER LUNG MASS- ~5-5.5cm; positive for M-LN. PET 2018- no distant mets.   # SEP 19th 2018- CARBO-ETOP   # Sep 2018-MRI brain-NEG  # Hep C [? Immunization in [employer]; s/p Harvoni at [place]; 2017]; CKD Stage III     Cancer of upper lobe of left lung (HCC)   HISTORY OF PRESENTING ILLNESS:  Edwin Jones 76 y.o.  male recently diagnosed Limited stage small cell lung cancer- currently on Carbo etoposide along with radiation- Currently status post cycle #1.  Cycle #2 was held one week ago because of neutropenia.   Patient today denies any worsening shortness of breath or cough. No hemoptysis. He has met with his PCP regarding his blood sugars.  Continues to chronic arthritic pain. Otherwise not any worse. No nausea no vomiting. In the interim he also met with nephrologist at [facility].  ROS: A complete 10 point review of system is done which is negative except mentioned above in history of present illness  MEDICAL HISTORY:  Past Medical History:  Diagnosis Date  . COPD (chronic obstructive pulmonary disease) ([place])   . Diabetes mellitus without complication ([place])   . GERD without esophagitis   . Hepatitis C virus    history of hep C treated with Harvoni  . Hypercholesterolemia   . Hypertension   . Seasonal allergies     SURGICAL HISTORY: Past Surgical History:  Procedure Laterality Date  . COLONOSCOPY     approximately 11 years ago  . FLEXIBLE BRONCHOSCOPY N/A 11/05/2016   Procedure: FLEXIBLE BRONCHOSCOPY;  Surgeon: Wilhelmina Mcardle, MD;  Location: [facility] ORS;  Service: Pulmonary;  Laterality: N/A;  . PORTA CATH INSERTION N/A 11/23/2016   Procedure: Glori Luis Cath Insertion;  Surgeon:  Algernon Huxley, MD;  Location: [facility];  Service: Cardiovascular;  Laterality: N/A;  . TONSILLECTOMY      SOCIAL HISTORY: Social History   Social History  . Marital status: Married    Spouse name: N/A  . Number of children: N/A  . Years of education: N/A   Occupational History  . Not on file.   Social History Main Topics  . Smoking status: Former Smoker    Packs/day: 1.00    Years: 50.00    Types: Cigarettes    Quit date: 04/07/2004  . Smokeless tobacco: Never Used  . Alcohol use No     Comment: history of alcohol use  . Drug use: No  . Sexual activity: Yes   Other Topics Concern  . Not on file   Social History Narrative  . No narrative on file    FAMILY HISTORY: Family History  Problem Relation Age of Onset  . Skin cancer Mother   . Ovarian cancer Sister        sister was diagnosed as a teenager  . Throat cancer Brother 59       brother #1  . Lung cancer Sister 15  . Throat cancer Brother 54       brother #2  . Skin cancer Sister     ALLERGIES:  is allergic to penicillin g; shellfish-derived products; statins; erythromycin; tamsulosin; tuberculin ppd; and iodinated diagnostic agents.  MEDICATIONS:  Current Outpatient  Prescriptions  Medication Sig Dispense Refill  . APPLE CIDER VINEGAR PO Take 450 mg by mouth at bedtime.     Marland Kitchen aspirin EC 81 MG tablet Take 81 mg by mouth at bedtime.     . cetirizine (ZYRTEC) 10 MG tablet Take 10 mg by mouth daily.    . cholecalciferol (VITAMIN D) 1000 units tablet Take 1,000 Units by mouth daily.    . Coenzyme Q10 100 MG capsule Take 100 mg by mouth daily.     . Flaxseed, Linseed, (FLAXSEED OIL) 1000 MG CAPS Take 1,000 mg by mouth at bedtime.     Marland Kitchen glipiZIDE (GLUCOTROL) 5 MG tablet Take 5 mg by mouth 2 (two) times daily.    Marland Kitchen GNP GARLIC EXTRACT PO Take 2,951 mg by mouth daily.     . hydrochlorothiazide (HYDRODIURIL) 25 MG tablet Take 25 mg by mouth daily.    Marland Kitchen lidocaine-prilocaine (EMLA) cream Apply 1  application topically as needed. 30 g 3  . lisinopril (PRINIVIL,ZESTRIL) 10 MG tablet Take 10 mg by mouth daily.    . metFORMIN (GLUCOPHAGE) 1000 MG tablet Take 1,000 mg by mouth 2 (two) times daily with a meal.     . Multiple Vitamin (MULTIVITAMIN WITH MINERALS) TABS tablet Take 1 tablet by mouth daily.    Marland Kitchen omeprazole (PRILOSEC) 20 MG capsule Take 20 mg by mouth every Monday, Wednesday, and Friday.     . sucralfate (CARAFATE) 1 g tablet Take 1 tablet (1 g total) by mouth 3 (three) times daily before meals. 90 tablet 6  . fluticasone (FLONASE) 50 MCG/ACT nasal spray Place 1 spray into both nostrils daily as needed for allergies or rhinitis.    Marland Kitchen glucagon (GLUCAGON EMERGENCY) 1 MG injection Inject 1 mg into the muscle once as needed. When blood glucose drops less than 50; and if patient has difficulty drinking. (Patient not taking: Reported on 12/23/2016) 1 each 12  . ondansetron (ZOFRAN) 8 MG tablet Take 1 tablet (8 mg total) by mouth every 8 (eight) hours as needed for nausea or vomiting (start 3 days; after chemo). (Patient not taking: Reported on 12/16/2016) 40 tablet 1  . prochlorperazine (COMPAZINE) 10 MG tablet Take 1 tablet (10 mg total) by mouth every 6 (six) hours as needed for nausea or vomiting. (Patient not taking: Reported on 12/16/2016) 40 tablet 1   No current facility-administered medications for this visit.    Facility-Administered Medications Ordered in Other Visits  Medication Dose Route Frequency Provider Last Rate Last Dose  . sodium chloride flush (NS) 0.9 % injection 10 mL  10 mL Intravenous PRN Cammie Sickle, MD   10 mL at 12/23/16 0935      .  PHYSICAL EXAMINATION: ECOG PERFORMANCE STATUS: 1 - Symptomatic but completely ambulatory  Vitals:   12/23/16 1020  BP: (!) 152/85  Pulse: 71  Resp: 18  Temp: (!) 95.5 F (35.3 C)   Filed Weights   12/23/16 1020  Weight: 200 lb 9.6 oz (91 kg)    GENERAL: Well-nourished well-developed; Alert, no distress and  comfortable.   With his wife.  EYES: no pallor or icterus OROPHARYNX: no thrush or ulceration; dentures.   NECK: supple, no masses felt LYMPH:  no palpable lymphadenopathy in the cervical, axillary or inguinal regions LUNGS: clear to auscultation and  No wheeze or crackles HEART/CVS: regular rate & rhythm and no murmurs; No lower extremity edema ABDOMEN: abdomen soft, non-tender and normal bowel sounds Musculoskeletal:no cyanosis of digits and no clubbing  PSYCH:  alert & oriented x 3 with fluent speech NEURO: no focal motor/sensory deficits SKIN:  no rashes or significant lesions  LABORATORY DATA:  I have reviewed the data as listed Lab Results  Component Value Date   WBC 3.4 (L) 12/23/2016   HGB 10.5 (L) 12/23/2016   HCT 32.0 (L) 12/23/2016   MCV 88.3 12/23/2016   PLT 272 12/23/2016    Recent Labs  11/25/16 0812 12/04/16 1104 12/16/16 0937 12/23/16 0942  NA 134* 133* 135 134*  K 4.7 4.8 4.6 4.7  CL 103 102 104 102  CO2 _0 GLUCOSE 247* 173* 135* 204*  BUN 37* 34* 25* 30*  CREATININE 2.13* 1.71* 1.99* 1.91*  CALCIUM 9.0 8.9 8.8* 9.1  GFRNONAA 28* 37* 31* 32*  GFRAA 33* 43* 36* 38*  PROT 7.2 7.5 7.3  --   ALBUMIN 3.0* 3.3* 3.3*  --   AST 15 14* 15  --   ALT 8* 8* 8*  --   ALKPHOS 69 66 71  --   BILITOT 0.4 0.2* 0.3  --     RADIOGRAPHIC STUDIES: I have personally reviewed the radiological images as listed and agreed with the findings in the report. No results found. IMPRESSION: 1. 6.5 cm hypermetabolic left upper lobe the lung mass partially abutting the mediastinum and left anterior hilum. Probable involvement of the left lower paratracheal lymph node. Questionable involvement of the right lower paratracheal lymph node. Assuming involvement of the left but not the right paratracheal lymph nodes, and assuming non-small cell lung cancer, appearance is compatible with T2b N2 M0 disease (stage IIIa). 2.  Aortic Atherosclerosis (ICD10-I70.0). 3. Urinary  bladder wall thickening may be due to nondistention, cystitis not entirely excluded.   Electronically Signed   By: Van Clines M.D.   On: 11/06/2016 12:48  ASSESSMENT & PLAN:   Cancer of upper lobe of left lung ([place]) # SMALL CELL LUNG CA  LIMITED STAGE- left upper lobe- on concurrent carbo [09/19]-Etop with RT [started 27th sep]. patient status post cycle #1 approximately 4 weeks ago. Tolerating chemotherapy well except for- severe neutropenia- which is improved after holding cycle#2/last week.   # Proceed with chemo cycle #2 today; WBC- 3.4/ANC-2.1; hb-10; platelets- N.   # Anemia- CKD/chemo- recommend PO Iron. Check check Iron studies/ferritin.   # constipation- on miralax; better.   # Diabetes- brittle; recommend close monitoring; recently followed with PCP.   # keep appts as planned on Oct 26th/labs/no chemo.   # cc: Dr. Delight St[facility]; [facility]   All questions were answered. The patient knows to call the clinic with any problems, questions or concerns.    Cammie Sickle, MD 12/23/2016 1:53 PM

## 2016-12-23 NOTE — Progress Notes (Signed)
Here for follow up

## 2016-12-23 NOTE — Assessment & Plan Note (Signed)
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 patient status post cycle #1 approximately 4 weeks ago. Tolerating chemotherapy well except for- severe neutropenia- which is improved after holding cycle#2/last week.   # Proceed with chemo cycle #2 today; WBC- 3.4/ANC-2.1; hb-10; platelets- N.   # Anemia- CKD/chemo- recommend PO Iron. Check check Iron studies/ferritin.   # constipation- on miralax; better.   # Diabetes- brittle; recommend close monitoring; recently followed with PCP.   # keep appts as planned on Oct 26th/labs/no chemo.   # cc: Dr. Delight Stare; [facility]

## 2016-12-24 ENCOUNTER — Ambulatory Visit
Admission: RE | Admit: 2016-12-24 | Discharge: 2016-12-24 | Disposition: A | Discharge status: Home / Self Care | Payer: Medicare Other | Source: Ambulatory Visit | Attending: Radiation Oncology | Admitting: Radiation Oncology

## 2016-12-24 ENCOUNTER — Inpatient Hospital Stay: Payer: Medicare Other

## 2016-12-24 VITALS — BP 163/81 | HR 72 | Temp 96.1°F | Resp 18

## 2016-12-24 DIAGNOSIS — Z510 Encounter for antineoplastic radiation therapy: Secondary | ICD-10-CM | POA: Diagnosis not present

## 2016-12-24 DIAGNOSIS — Z5111 Encounter for antineoplastic chemotherapy: Secondary | ICD-10-CM | POA: Diagnosis not present

## 2016-12-24 DIAGNOSIS — C3412 Malignant neoplasm of upper lobe, left bronchus or lung: Secondary | ICD-10-CM

## 2016-12-24 MED ORDER — DEXAMETHASONE SODIUM PHOSPHATE 10 MG/ML IJ SOLN
10.0000 mg | Freq: Once | INTRAMUSCULAR | Status: AC
  Administered 2016-12-24: 10 mg via INTRAVENOUS

## 2016-12-24 MED ORDER — SODIUM CHLORIDE 0.9 % IV SOLN
75.0000 mg/m2 | Freq: Once | INTRAVENOUS | Status: AC
  Administered 2016-12-24: 160 mg via INTRAVENOUS
  Filled 2016-12-24: qty 8

## 2016-12-24 MED ORDER — SODIUM CHLORIDE 0.9 % IV SOLN
Freq: Once | INTRAVENOUS | Status: AC
  Administered 2016-12-24: 14:00:00 via INTRAVENOUS
  Filled 2016-12-24: qty 1000

## 2016-12-24 MED ORDER — HEPARIN SOD (PORK) LOCK FLUSH 100 UNIT/ML IV SOLN
500.0000 [IU] | Freq: Once | INTRAVENOUS | Status: AC | PRN
  Administered 2016-12-24: 500 [IU]
  Filled 2016-12-24: qty 5

## 2016-12-24 MED ORDER — DEXAMETHASONE SODIUM PHOSPHATE 10 MG/ML IJ SOLN
INTRAMUSCULAR | Status: AC
  Filled 2016-12-24: qty 1

## 2016-12-24 MED ORDER — SODIUM CHLORIDE 0.9% FLUSH
10.0000 mL | INTRAVENOUS | Status: DC | PRN
  Filled 2016-12-24: qty 10

## 2016-12-24 MED ORDER — HEPARIN SOD (PORK) LOCK FLUSH 100 UNIT/ML IV SOLN
INTRAVENOUS | Status: AC
  Filled 2016-12-24: qty 5

## 2016-12-25 ENCOUNTER — Ambulatory Visit
Admission: RE | Admit: 2016-12-25 | Discharge: 2016-12-25 | Disposition: A | Discharge status: Home / Self Care | Payer: Medicare Other | Source: Ambulatory Visit | Attending: Radiation Oncology | Admitting: Radiation Oncology

## 2016-12-25 ENCOUNTER — Inpatient Hospital Stay: Payer: Medicare Other

## 2016-12-25 VITALS — BP 150/80 | HR 73 | Temp 97.0°F | Resp 18

## 2016-12-25 DIAGNOSIS — C3412 Malignant neoplasm of upper lobe, left bronchus or lung: Secondary | ICD-10-CM

## 2016-12-25 DIAGNOSIS — Z510 Encounter for antineoplastic radiation therapy: Secondary | ICD-10-CM | POA: Diagnosis not present

## 2016-12-25 DIAGNOSIS — Z5111 Encounter for antineoplastic chemotherapy: Secondary | ICD-10-CM | POA: Diagnosis not present

## 2016-12-25 MED ORDER — SODIUM CHLORIDE 0.9 % IV SOLN
75.0000 mg/m2 | Freq: Once | INTRAVENOUS | Status: AC
  Administered 2016-12-25: 160 mg via INTRAVENOUS
  Filled 2016-12-25: qty 8

## 2016-12-25 MED ORDER — DEXAMETHASONE SODIUM PHOSPHATE 10 MG/ML IJ SOLN
10.0000 mg | Freq: Once | INTRAMUSCULAR | Status: AC
  Administered 2016-12-25: 10 mg via INTRAVENOUS
  Filled 2016-12-25: qty 1

## 2016-12-25 MED ORDER — SODIUM CHLORIDE 0.9 % IV SOLN
Freq: Once | INTRAVENOUS | Status: AC
  Administered 2016-12-25: 14:00:00 via INTRAVENOUS
  Filled 2016-12-25: qty 1000

## 2016-12-25 MED ORDER — SODIUM CHLORIDE 0.9 % IV SOLN: 10.0000 mg | Freq: Once | INTRAVENOUS | Status: DC

## 2016-12-28 ENCOUNTER — Ambulatory Visit
Admission: RE | Admit: 2016-12-28 | Discharge: 2016-12-28 | Disposition: A | Discharge status: Home / Self Care | Payer: Medicare Other | Source: Ambulatory Visit | Attending: Radiation Oncology | Admitting: Radiation Oncology

## 2016-12-28 DIAGNOSIS — Z510 Encounter for antineoplastic radiation therapy: Secondary | ICD-10-CM | POA: Diagnosis not present

## 2016-12-29 ENCOUNTER — Ambulatory Visit
Admission: RE | Admit: 2016-12-29 | Discharge: 2016-12-29 | Disposition: A | Discharge status: Home / Self Care | Payer: Medicare Other | Source: Ambulatory Visit | Attending: Radiation Oncology | Admitting: Radiation Oncology

## 2016-12-29 DIAGNOSIS — Z510 Encounter for antineoplastic radiation therapy: Secondary | ICD-10-CM | POA: Diagnosis not present

## 2016-12-30 ENCOUNTER — Ambulatory Visit
Admission: RE | Admit: 2016-12-30 | Discharge: 2016-12-30 | Disposition: A | Discharge status: Home / Self Care | Payer: Medicare Other | Source: Ambulatory Visit | Attending: Radiation Oncology | Admitting: Radiation Oncology

## 2016-12-30 DIAGNOSIS — Z510 Encounter for antineoplastic radiation therapy: Secondary | ICD-10-CM | POA: Diagnosis not present

## 2016-12-31 ENCOUNTER — Ambulatory Visit
Admission: RE | Admit: 2016-12-31 | Discharge: 2016-12-31 | Disposition: A | Discharge status: Home / Self Care | Payer: Medicare Other | Source: Ambulatory Visit | Attending: Radiation Oncology | Admitting: Radiation Oncology

## 2016-12-31 DIAGNOSIS — Z510 Encounter for antineoplastic radiation therapy: Secondary | ICD-10-CM | POA: Diagnosis not present

## 2017-01-01 ENCOUNTER — Inpatient Hospital Stay (HOSPITAL_BASED_OUTPATIENT_CLINIC_OR_DEPARTMENT_OTHER): Payer: Medicare Other | Admitting: Internal Medicine

## 2017-01-01 ENCOUNTER — Ambulatory Visit
Admission: RE | Admit: 2017-01-01 | Discharge: 2017-01-01 | Disposition: A | Discharge status: Home / Self Care | Payer: Medicare Other | Source: Ambulatory Visit | Attending: Radiation Oncology | Admitting: Radiation Oncology

## 2017-01-01 ENCOUNTER — Encounter: Payer: Self-pay | Admitting: Internal Medicine

## 2017-01-01 ENCOUNTER — Inpatient Hospital Stay: Payer: Medicare Other

## 2017-01-01 DIAGNOSIS — I129 Hypertensive chronic kidney disease with stage 1 through stage 4 chronic kidney disease, or unspecified chronic kidney disease: Secondary | ICD-10-CM | POA: Diagnosis not present

## 2017-01-01 DIAGNOSIS — R0981 Nasal congestion: Secondary | ICD-10-CM | POA: Diagnosis not present

## 2017-01-01 DIAGNOSIS — E631 Imbalance of constituents of food intake: Secondary | ICD-10-CM

## 2017-01-01 DIAGNOSIS — Z87891 Personal history of nicotine dependence: Secondary | ICD-10-CM

## 2017-01-01 DIAGNOSIS — T451X5S Adverse effect of antineoplastic and immunosuppressive drugs, sequela: Secondary | ICD-10-CM | POA: Diagnosis not present

## 2017-01-01 DIAGNOSIS — I700 Atherosclerosis of aorta: Secondary | ICD-10-CM | POA: Diagnosis not present

## 2017-01-01 DIAGNOSIS — E1122 Type 2 diabetes mellitus with diabetic chronic kidney disease: Secondary | ICD-10-CM | POA: Diagnosis not present

## 2017-01-01 DIAGNOSIS — Z79899 Other long term (current) drug therapy: Secondary | ICD-10-CM

## 2017-01-01 DIAGNOSIS — K219 Gastro-esophageal reflux disease without esophagitis: Secondary | ICD-10-CM

## 2017-01-01 DIAGNOSIS — D6481 Anemia due to antineoplastic chemotherapy: Secondary | ICD-10-CM

## 2017-01-01 DIAGNOSIS — D701 Agranulocytosis secondary to cancer chemotherapy: Secondary | ICD-10-CM

## 2017-01-01 DIAGNOSIS — B1920 Unspecified viral hepatitis C without hepatic coma: Secondary | ICD-10-CM

## 2017-01-01 DIAGNOSIS — E7800 Pure hypercholesterolemia, unspecified: Secondary | ICD-10-CM

## 2017-01-01 DIAGNOSIS — Z5111 Encounter for antineoplastic chemotherapy: Secondary | ICD-10-CM | POA: Diagnosis not present

## 2017-01-01 DIAGNOSIS — J449 Chronic obstructive pulmonary disease, unspecified: Secondary | ICD-10-CM

## 2017-01-01 DIAGNOSIS — Z7984 Long term (current) use of oral hypoglycemic drugs: Secondary | ICD-10-CM

## 2017-01-01 DIAGNOSIS — C3412 Malignant neoplasm of upper lobe, left bronchus or lung: Secondary | ICD-10-CM

## 2017-01-01 DIAGNOSIS — Z510 Encounter for antineoplastic radiation therapy: Secondary | ICD-10-CM | POA: Diagnosis not present

## 2017-01-01 DIAGNOSIS — Z800 Family history of malignant neoplasm of digestive organs: Secondary | ICD-10-CM

## 2017-01-01 DIAGNOSIS — Z801 Family history of malignant neoplasm of trachea, bronchus and lung: Secondary | ICD-10-CM

## 2017-01-01 DIAGNOSIS — N183 Chronic kidney disease, stage 3 (moderate): Secondary | ICD-10-CM

## 2017-01-01 DIAGNOSIS — M1990 Unspecified osteoarthritis, unspecified site: Secondary | ICD-10-CM

## 2017-01-01 DIAGNOSIS — Z808 Family history of malignant neoplasm of other organs or systems: Secondary | ICD-10-CM

## 2017-01-01 DIAGNOSIS — Z880 Allergy status to penicillin: Secondary | ICD-10-CM

## 2017-01-01 DIAGNOSIS — G8929 Other chronic pain: Secondary | ICD-10-CM

## 2017-01-01 DIAGNOSIS — Z7982 Long term (current) use of aspirin: Secondary | ICD-10-CM

## 2017-01-01 LAB — BASIC METABOLIC PANEL
ANION GAP: 9 (ref 5–15)
BUN: 27 mg/dL — ABNORMAL HIGH (ref 6–20)
CO2: 24 mmol/L (ref 22–32)
Calcium: 9.4 mg/dL (ref 8.9–10.3)
Chloride: 102 mmol/L (ref 101–111)
Creatinine, Ser: 1.81 mg/dL — ABNORMAL HIGH (ref 0.61–1.24)
GFR calc Af Amer: 40 mL/min — ABNORMAL LOW (ref >60)
GFR, EST NON AFRICAN AMERICAN: 35 mL/min — AB (ref >60)
Glucose, Bld: 113 mg/dL — ABNORMAL HIGH (ref 65–99)
POTASSIUM: 5 mmol/L (ref 3.5–5.1)
SODIUM: 135 mmol/L (ref 135–145)

## 2017-01-01 LAB — IRON AND TIBC
Iron: 35 ug/dL — ABNORMAL LOW (ref 45–182)
SATURATION RATIOS: 13 % — AB (ref 17.9–39.5)
TIBC: 263 ug/dL (ref 250–450)
UIBC: 228 ug/dL

## 2017-01-01 LAB — CBC WITH DIFFERENTIAL/PLATELET
BASOS ABS: 0.1 10*3/uL (ref 0–0.1)
Basophils Relative: 2 %
EOS ABS: 0.1 10*3/uL (ref 0–0.7)
EOS PCT: 5 %
HCT: 33.1 % — ABNORMAL LOW (ref 40.0–52.0)
Hemoglobin: 10.9 g/dL — ABNORMAL LOW (ref 13.0–18.0)
Lymphocytes Relative: 17 %
Lymphs Abs: 0.4 10*3/uL — ABNORMAL LOW (ref 1.0–3.6)
MCH: 29.3 pg (ref 26.0–34.0)
MCHC: 32.9 g/dL (ref 32.0–36.0)
MCV: 89 fL (ref 80.0–100.0)
Monocytes Absolute: 0.2 10*3/uL (ref 0.2–1.0)
Monocytes Relative: 9 %
Neutro Abs: 1.8 10*3/uL (ref 1.4–6.5)
Neutrophils Relative %: 67 %
PLATELETS: 259 10*3/uL (ref 150–440)
RBC: 3.72 MIL/uL — AB (ref 4.40–5.90)
RDW: 17.4 % — ABNORMAL HIGH (ref 11.5–14.5)
WBC: 2.6 10*3/uL — AB (ref 3.8–10.6)

## 2017-01-01 LAB — FERRITIN: FERRITIN: 208 ng/mL (ref 24–336)

## 2017-01-01 NOTE — Progress Notes (Signed)
[facility] NOTE  Patient Care Team: Marguerita Merles, MD as PCP - General (Family Medicine) Telford Nab, RN as Registered Nurse  CHIEF COMPLAINTS/PURPOSE OF CONSULTATION: LEFT LUNG MASS     Oncology History   # AUG 2018- LIMITED STAGE SMALL CELL LUNG CA ;[ T4N3M0] LEFT UPPER LUNG MASS- ~5-5.5cm; positive for M-LN. PET 2018- no distant mets.   # SEP 19th 2018- CARBO-ETOP   # Sep 2018-MRI brain-NEG  # Hep C [? Immunization in [employer]; s/p Harvoni at [place]; 2017]; CKD Stage III     Cancer of upper lobe of left lung (HCC)   HISTORY OF PRESENTING ILLNESS:  Edwin Jones 76 y.o.  male recently diagnosed Limited stage small cell lung cancer- currently on Carbo etoposide along with radiation- Currently status post cycle #2 approximately 10 days ago.  Patient today denies any worsening shortness of breath or cough. No hemoptysis. Patient states his blood sugars and up to 400 on the day of chemotherapy; however since then his blood sugars have improved.  Otherwise not any worse. No nausea no vomiting. He complains of "congestion in his sinuses". He is not taking any medications.  ROS: A complete 10 point review of system is done which is negative except mentioned above in history of present illness  MEDICAL HISTORY:  Past Medical History:  Diagnosis Date  . COPD (chronic obstructive pulmonary disease) ([place])   . Diabetes mellitus without complication ([place])   . GERD without esophagitis   . Hepatitis C virus    history of hep C treated with Harvoni  . Hypercholesterolemia   . Hypertension   . Seasonal allergies     SURGICAL HISTORY: Past Surgical History:  Procedure Laterality Date  . COLONOSCOPY     approximately 11 years ago  . FLEXIBLE BRONCHOSCOPY N/A 11/05/2016   Procedure: FLEXIBLE BRONCHOSCOPY;  Surgeon: Wilhelmina Mcardle, MD;  Location: [facility];  Service: Pulmonary;  Laterality: N/A;  . PORTA CATH INSERTION N/A 11/23/2016   Procedure: Glori Luis Cath  Insertion;  Surgeon: Algernon Huxley, MD;  Location: [facility];  Service: Cardiovascular;  Laterality: N/A;  . TONSILLECTOMY      SOCIAL HISTORY: Social History   Social History  . Marital status: Married    Spouse name: N/A  . Number of children: N/A  . Years of education: N/A   Occupational History  . Not on file.   Social History Main Topics  . Smoking status: Former Smoker    Packs/day: 1.00    Years: 50.00    Types: Cigarettes    Quit date: 04/07/2004  . Smokeless tobacco: Never Used  . Alcohol use No     Comment: history of alcohol use  . Drug use: No  . Sexual activity: Yes   Other Topics Concern  . Not on file   Social History Narrative  . No narrative on file    FAMILY HISTORY: Family History  Problem Relation Age of Onset  . Skin cancer Mother   . Ovarian cancer Sister        sister was diagnosed as a teenager  . Throat cancer Brother 51       brother #1  . Lung cancer Sister 34  . Throat cancer Brother 73       brother #2  . Skin cancer Sister     ALLERGIES:  is allergic to penicillin g; shellfish-derived products; statins; erythromycin; tamsulosin; tuberculin ppd; and iodinated diagnostic agents.  MEDICATIONS:  Current  Outpatient Prescriptions  Medication Sig Dispense Refill  . APPLE CIDER VINEGAR PO Take 450 mg by mouth at bedtime.     Marland Kitchen aspirin EC 81 MG tablet Take 81 mg by mouth at bedtime.     . cetirizine (ZYRTEC) 10 MG tablet Take 10 mg by mouth daily.    . cholecalciferol (VITAMIN D) 1000 units tablet Take 1,000 Units by mouth daily.    . Coenzyme Q10 100 MG capsule Take 100 mg by mouth daily.     . Flaxseed, Linseed, (FLAXSEED OIL) 1000 MG CAPS Take 1,000 mg by mouth at bedtime.     . fluticasone (FLONASE) 50 MCG/ACT nasal spray Place 1 spray into both nostrils daily as needed for allergies or rhinitis.    Marland Kitchen glipiZIDE (GLUCOTROL) 5 MG tablet Take 5 mg by mouth 2 (two) times daily.    Marland Kitchen glucagon (GLUCAGON EMERGENCY) 1 MG  injection Inject 1 mg into the muscle once as needed. When blood glucose drops less than 50; and if patient has difficulty drinking. 1 each 12  . GNP GARLIC EXTRACT PO Take 9,702 mg by mouth daily.     . hydrochlorothiazide (HYDRODIURIL) 25 MG tablet Take 25 mg by mouth daily.    Marland Kitchen lidocaine-prilocaine (EMLA) cream Apply 1 application topically as needed. 30 g 3  . lisinopril (PRINIVIL,ZESTRIL) 10 MG tablet Take 10 mg by mouth daily.    . metFORMIN (GLUCOPHAGE) 1000 MG tablet Take 1,000 mg by mouth 2 (two) times daily with a meal.     . Multiple Vitamin (MULTIVITAMIN WITH MINERALS) TABS tablet Take 1 tablet by mouth daily.    Marland Kitchen omeprazole (PRILOSEC) 20 MG capsule Take 20 mg by mouth every Monday, Wednesday, and Friday.     . ondansetron (ZOFRAN) 8 MG tablet Take 1 tablet (8 mg total) by mouth every 8 (eight) hours as needed for nausea or vomiting (start 3 days; after chemo). 40 tablet 1  . prochlorperazine (COMPAZINE) 10 MG tablet Take 1 tablet (10 mg total) by mouth every 6 (six) hours as needed for nausea or vomiting. 40 tablet 1  . sucralfate (CARAFATE) 1 g tablet Take 1 tablet (1 g total) by mouth 3 (three) times daily before meals. 90 tablet 6   No current facility-administered medications for this visit.       Marland Kitchen  PHYSICAL EXAMINATION: ECOG PERFORMANCE STATUS: 1 - Symptomatic but completely ambulatory  Vitals:   01/01/17 1324  BP: 133/75  Pulse: 80  Temp: 97.7 F (36.5 C)   Filed Weights   01/01/17 1318  Weight: 199 lb 8 oz (90.5 kg)    GENERAL: Well-nourished well-developed; Alert, no distress and comfortable.   With his wife.  EYES: no pallor or icterus OROPHARYNX: no thrush or ulceration; dentures.   NECK: supple, no masses felt LYMPH:  no palpable lymphadenopathy in the cervical, axillary or inguinal regions LUNGS: clear to auscultation and  No wheeze or crackles HEART/CVS: regular rate & rhythm and no murmurs; No lower extremity edema ABDOMEN: abdomen soft,  non-tender and normal bowel sounds Musculoskeletal:no cyanosis of digits and no clubbing  PSYCH: alert & oriented x 3 with fluent speech NEURO: no focal motor/sensory deficits SKIN:  no rashes or significant lesions  LABORATORY DATA:  I have reviewed the data as listed Lab Results  Component Value Date   WBC 2.6 (L) 01/01/2017   HGB 10.9 (L) 01/01/2017   HCT 33.1 (L) 01/01/2017   MCV 89.0 01/01/2017   PLT 259 01/01/2017  Recent Labs  11/25/16 0812 12/04/16 1104 12/16/16 0937 12/23/16 0942 01/01/17 1303  NA 134* 133* 135 134* 135  K 4.7 4.8 4.6 4.7 5.0  CL 103 102 104 102 102  CO2 _0 GLUCOSE 247* 173* 135* 204* 113*  BUN 37* 34* 25* 30* 27*  CREATININE 2.13* 1.71* 1.99* 1.91* 1.81*  CALCIUM 9.0 8.9 8.8* 9.1 9.4  GFRNONAA 28* 37* 31* 32* 35*  GFRAA 33* 43* 36* 38* 40*  PROT 7.2 7.5 7.3  --   --   ALBUMIN 3.0* 3.3* 3.3*  --   --   AST 15 14* 15  --   --   ALT 8* 8* 8*  --   --   ALKPHOS 69 66 71  --   --   BILITOT 0.4 0.2* 0.3  --   --     RADIOGRAPHIC STUDIES: I have personally reviewed the radiological images as listed and agreed with the findings in the report. No results found. IMPRESSION: 1. 6.5 cm hypermetabolic left upper lobe the lung mass partially abutting the mediastinum and left anterior hilum. Probable involvement of the left lower paratracheal lymph node. Questionable involvement of the right lower paratracheal lymph node. Assuming involvement of the left but not the right paratracheal lymph nodes, and assuming non-small cell lung cancer, appearance is compatible with T2b N2 M0 disease (stage IIIa). 2.  Aortic Atherosclerosis (ICD10-I70.0). 3. Urinary bladder wall thickening may be due to nondistention, cystitis not entirely excluded.   Electronically Signed   By: Van Clines M.D.   On: 11/06/2016 12:48  ASSESSMENT & PLAN:   Cancer of upper lobe of left lung ([place]) # SMALL CELL LUNG CA  LIMITED STAGE- left upper lobe-  on concurrent carbo [09/19]-Etop with RT [started 27th sep]. patient status post cycle #2 appx 10 days ago.  # s/p cycle #2 appx. 10 days ago- WBC- 2.6/ANC-1.8; hb-10; platelets- N. Will plan proceeding with #3 cycle on 11/07.  Discussed that we'll plan to get a repeat imaging in approximately 2 months or so after chemoradiation.  # Anemia- CKD/chemo-stable -10-11 on PO iron.   # constipation- on miralax; better.   # "congestion of sinus"- recommend Mucinex/Claritin.   # Diabetes- brittle; recommend close monitoring; improved; BG-114 today; post chemo- upto 400s; will cut down steroids.   # follow up on 11/07/labs; 10/31-labs.   All questions were answered. The patient knows to call the clinic with any problems, questions or concerns.    Cammie Sickle, MD 01/01/2017 1:59 PM

## 2017-01-01 NOTE — Progress Notes (Signed)
Patient here for follow up. He states he has had congestion and cough for the past week.

## 2017-01-01 NOTE — Assessment & Plan Note (Addendum)
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 patient status post cycle #2 appx 10 days ago.  # s/p cycle #2 appx. 10 days ago- WBC- 2.6/ANC-1.8; hb-10; platelets- N. Will plan proceeding with #3 cycle on 11/07.  Discussed that we'll plan to get a repeat imaging in approximately 2 months or so after chemoradiation.  # Anemia- CKD/chemo-stable -10-11 on PO iron.   # constipation- on miralax; better.   # "congestion of sinus"- recommend Mucinex/Claritin.   # Diabetes- brittle; recommend close monitoring; improved; BG-114 today; post chemo- upto 400s; will cut down steroids.   # follow up on 11/07/labs; 10/31-labs.

## 2017-01-04 ENCOUNTER — Ambulatory Visit
Admission: RE | Admit: 2017-01-04 | Discharge: 2017-01-04 | Disposition: A | Discharge status: Home / Self Care | Payer: Medicare Other | Source: Ambulatory Visit | Attending: Radiation Oncology | Admitting: Radiation Oncology

## 2017-01-04 DIAGNOSIS — Z510 Encounter for antineoplastic radiation therapy: Secondary | ICD-10-CM | POA: Diagnosis not present

## 2017-01-05 ENCOUNTER — Ambulatory Visit
Admission: RE | Admit: 2017-01-05 | Discharge: 2017-01-05 | Disposition: A | Discharge status: Home / Self Care | Payer: Medicare Other | Source: Ambulatory Visit | Attending: Radiation Oncology | Admitting: Radiation Oncology

## 2017-01-05 DIAGNOSIS — Z510 Encounter for antineoplastic radiation therapy: Secondary | ICD-10-CM | POA: Diagnosis not present

## 2017-01-06 ENCOUNTER — Ambulatory Visit
Admission: RE | Admit: 2017-01-06 | Discharge: 2017-01-06 | Disposition: A | Discharge status: Home / Self Care | Payer: Medicare Other | Source: Ambulatory Visit | Attending: Radiation Oncology | Admitting: Radiation Oncology

## 2017-01-06 ENCOUNTER — Inpatient Hospital Stay: Payer: Medicare Other

## 2017-01-06 DIAGNOSIS — Z510 Encounter for antineoplastic radiation therapy: Secondary | ICD-10-CM | POA: Diagnosis not present

## 2017-01-07 ENCOUNTER — Ambulatory Visit
Admission: RE | Admit: 2017-01-07 | Discharge: 2017-01-07 | Disposition: A | Discharge status: Home / Self Care | Payer: Medicare Other | Source: Ambulatory Visit | Attending: Radiation Oncology | Admitting: Radiation Oncology

## 2017-01-07 DIAGNOSIS — Z510 Encounter for antineoplastic radiation therapy: Secondary | ICD-10-CM | POA: Diagnosis not present

## 2017-01-08 ENCOUNTER — Ambulatory Visit
Admission: RE | Admit: 2017-01-08 | Discharge: 2017-01-08 | Disposition: A | Discharge status: Home / Self Care | Payer: Medicare Other | Source: Ambulatory Visit | Attending: Radiation Oncology | Admitting: Radiation Oncology

## 2017-01-08 DIAGNOSIS — Z510 Encounter for antineoplastic radiation therapy: Secondary | ICD-10-CM | POA: Diagnosis not present

## 2017-01-11 ENCOUNTER — Ambulatory Visit
Admission: RE | Admit: 2017-01-11 | Discharge: 2017-01-11 | Disposition: A | Discharge status: Home / Self Care | Payer: Medicare Other | Source: Ambulatory Visit | Attending: Radiation Oncology | Admitting: Radiation Oncology

## 2017-01-11 DIAGNOSIS — Z510 Encounter for antineoplastic radiation therapy: Secondary | ICD-10-CM | POA: Diagnosis not present

## 2017-01-12 ENCOUNTER — Ambulatory Visit
Admission: RE | Admit: 2017-01-12 | Discharge: 2017-01-12 | Disposition: A | Discharge status: Home / Self Care | Payer: Medicare Other | Source: Ambulatory Visit | Attending: Radiation Oncology | Admitting: Radiation Oncology

## 2017-01-12 DIAGNOSIS — Z510 Encounter for antineoplastic radiation therapy: Secondary | ICD-10-CM | POA: Diagnosis not present

## 2017-01-13 ENCOUNTER — Ambulatory Visit
Admission: RE | Admit: 2017-01-13 | Discharge: 2017-01-13 | Disposition: A | Discharge status: Home / Self Care | Payer: Medicare Other | Source: Ambulatory Visit | Attending: Radiation Oncology | Admitting: Radiation Oncology

## 2017-01-13 ENCOUNTER — Inpatient Hospital Stay (HOSPITAL_BASED_OUTPATIENT_CLINIC_OR_DEPARTMENT_OTHER): Payer: Medicare Other | Admitting: Internal Medicine

## 2017-01-13 ENCOUNTER — Inpatient Hospital Stay: Payer: Medicare Other

## 2017-01-13 ENCOUNTER — Inpatient Hospital Stay: Payer: Medicare Other | Attending: Internal Medicine

## 2017-01-13 ENCOUNTER — Other Ambulatory Visit: Payer: Self-pay | Admitting: *Deleted

## 2017-01-13 VITALS — BP 153/72 | HR 67 | Temp 97.8°F | Resp 20 | Ht 75.0 in | Wt 202.2 lb

## 2017-01-13 DIAGNOSIS — I700 Atherosclerosis of aorta: Secondary | ICD-10-CM | POA: Insufficient documentation

## 2017-01-13 DIAGNOSIS — Z79899 Other long term (current) drug therapy: Secondary | ICD-10-CM

## 2017-01-13 DIAGNOSIS — K219 Gastro-esophageal reflux disease without esophagitis: Secondary | ICD-10-CM

## 2017-01-13 DIAGNOSIS — Z87891 Personal history of nicotine dependence: Secondary | ICD-10-CM | POA: Insufficient documentation

## 2017-01-13 DIAGNOSIS — Z800 Family history of malignant neoplasm of digestive organs: Secondary | ICD-10-CM | POA: Diagnosis not present

## 2017-01-13 DIAGNOSIS — Z8041 Family history of malignant neoplasm of ovary: Secondary | ICD-10-CM

## 2017-01-13 DIAGNOSIS — Z808 Family history of malignant neoplasm of other organs or systems: Secondary | ICD-10-CM | POA: Insufficient documentation

## 2017-01-13 DIAGNOSIS — K5900 Constipation, unspecified: Secondary | ICD-10-CM | POA: Diagnosis not present

## 2017-01-13 DIAGNOSIS — Z801 Family history of malignant neoplasm of trachea, bronchus and lung: Secondary | ICD-10-CM

## 2017-01-13 DIAGNOSIS — Z880 Allergy status to penicillin: Secondary | ICD-10-CM

## 2017-01-13 DIAGNOSIS — J449 Chronic obstructive pulmonary disease, unspecified: Secondary | ICD-10-CM

## 2017-01-13 DIAGNOSIS — E1122 Type 2 diabetes mellitus with diabetic chronic kidney disease: Secondary | ICD-10-CM | POA: Insufficient documentation

## 2017-01-13 DIAGNOSIS — Z5111 Encounter for antineoplastic chemotherapy: Secondary | ICD-10-CM | POA: Insufficient documentation

## 2017-01-13 DIAGNOSIS — Z7984 Long term (current) use of oral hypoglycemic drugs: Secondary | ICD-10-CM

## 2017-01-13 DIAGNOSIS — E7800 Pure hypercholesterolemia, unspecified: Secondary | ICD-10-CM | POA: Insufficient documentation

## 2017-01-13 DIAGNOSIS — Z7689 Persons encountering health services in other specified circumstances: Secondary | ICD-10-CM | POA: Insufficient documentation

## 2017-01-13 DIAGNOSIS — Z7982 Long term (current) use of aspirin: Secondary | ICD-10-CM

## 2017-01-13 DIAGNOSIS — Z510 Encounter for antineoplastic radiation therapy: Secondary | ICD-10-CM | POA: Diagnosis not present

## 2017-01-13 DIAGNOSIS — C3412 Malignant neoplasm of upper lobe, left bronchus or lung: Secondary | ICD-10-CM | POA: Diagnosis not present

## 2017-01-13 DIAGNOSIS — Z923 Personal history of irradiation: Secondary | ICD-10-CM | POA: Diagnosis not present

## 2017-01-13 DIAGNOSIS — N183 Chronic kidney disease, stage 3 (moderate): Secondary | ICD-10-CM | POA: Insufficient documentation

## 2017-01-13 DIAGNOSIS — I129 Hypertensive chronic kidney disease with stage 1 through stage 4 chronic kidney disease, or unspecified chronic kidney disease: Secondary | ICD-10-CM | POA: Insufficient documentation

## 2017-01-13 LAB — CBC WITH DIFFERENTIAL/PLATELET
Basophils Absolute: 0 10*3/uL (ref 0–0.1)
Basophils Relative: 2 %
EOS ABS: 0.1 10*3/uL (ref 0–0.7)
EOS PCT: 5 %
HCT: 33.2 % — ABNORMAL LOW (ref 40.0–52.0)
Hemoglobin: 10.9 g/dL — ABNORMAL LOW (ref 13.0–18.0)
LYMPHS ABS: 0.3 10*3/uL — AB (ref 1.0–3.6)
LYMPHS PCT: 16 %
MCH: 29.4 pg (ref 26.0–34.0)
MCHC: 32.9 g/dL (ref 32.0–36.0)
MCV: 89.4 fL (ref 80.0–100.0)
MONO ABS: 0.5 10*3/uL (ref 0.2–1.0)
MONOS PCT: 31 %
Neutro Abs: 0.7 10*3/uL — ABNORMAL LOW (ref 1.4–6.5)
Neutrophils Relative %: 46 %
PLATELETS: 207 10*3/uL (ref 150–440)
RBC: 3.71 MIL/uL — AB (ref 4.40–5.90)
RDW: 18.6 % — ABNORMAL HIGH (ref 11.5–14.5)
WBC: 1.6 10*3/uL — ABNORMAL LOW (ref 3.8–10.6)

## 2017-01-13 LAB — COMPREHENSIVE METABOLIC PANEL
ALBUMIN: 3.5 g/dL (ref 3.5–5.0)
ALT: 10 U/L — AB (ref 17–63)
AST: 18 U/L (ref 15–41)
Alkaline Phosphatase: 69 U/L (ref 38–126)
Anion gap: 7 (ref 5–15)
BUN: 31 mg/dL — AB (ref 6–20)
CHLORIDE: 105 mmol/L (ref 101–111)
CO2: 23 mmol/L (ref 22–32)
CREATININE: 1.89 mg/dL — AB (ref 0.61–1.24)
Calcium: 9.1 mg/dL (ref 8.9–10.3)
GFR calc Af Amer: 38 mL/min — ABNORMAL LOW (ref >60)
GFR, EST NON AFRICAN AMERICAN: 33 mL/min — AB (ref >60)
GLUCOSE: 165 mg/dL — AB (ref 65–99)
POTASSIUM: 4.5 mmol/L (ref 3.5–5.1)
Sodium: 135 mmol/L (ref 135–145)
Total Bilirubin: 0.3 mg/dL (ref 0.3–1.2)
Total Protein: 7.4 g/dL (ref 6.5–8.1)

## 2017-01-13 MED ORDER — HEPARIN SOD (PORK) LOCK FLUSH 100 UNIT/ML IV SOLN
500.0000 [IU] | Freq: Once | INTRAVENOUS | Status: AC
  Administered 2017-01-13: 500 [IU] via INTRAVENOUS
  Filled 2017-01-13: qty 5

## 2017-01-13 NOTE — Assessment & Plan Note (Addendum)
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 patient status post cycle # 2 appx 3 weeks ago.  # HOLD chemo-; WBC- 1.6; ANC-800; add onpro starting with next cycle.  We will plan to start chemotherapy in 1 week.  # Growth factor-Neulasta/On pro would be given as prophylaxis for chemotherapy-induced neutropenia to prevent febrile neutropenias. Discussed potential side effect- myalgias/arthralgias- recommend Claritin for 4 days.   # Anemia- CKD/chemo-stable -10-11 on PO iron.   # constipation- on miralax; better.   # Diabetes- brittle; recommend close monitoring; improved; BG-114 today; post chemo- upto 400s; will cut down steroids.   # HOLD chemo today; 1 week-Labs/carbo-etop; with day-3; with onpro; follow up in 10days post chemo/labs;No chemo.

## 2017-01-13 NOTE — Progress Notes (Signed)
[facility] NOTE  Patient Care Team: Marguerita Merles, MD as PCP - General (Family Medicine) Telford Nab, RN as Registered Nurse  CHIEF COMPLAINTS/PURPOSE OF CONSULTATION: LEFT LUNG MASS     Oncology History   # AUG 2018- LIMITED STAGE SMALL CELL LUNG CA ;[ T4N3M0] LEFT UPPER LUNG MASS- ~5-5.5cm; positive for M-LN. PET 2018- no distant mets.   # SEP 19th 2018- CARBO-ETOP-RT [finished RT- 01/13/2017]   # Sep 2018-MRI brain-NEG  # Hep C [? Immunization in [employer]; s/p Harvoni at [place]; 2017]; CKD Stage III     Cancer of upper lobe of left lung (HCC)   HISTORY OF PRESENTING ILLNESS:  Edwin Jones 76 y.o.  male recently diagnosed Limited stage small cell lung cancer- currently on Carbo etoposide along with radiation- Currently status post cycle #2 approximately 3 days ago.  Cycle #2 was delayed because of neutropenia by 1 week.  Patient finished his last dose of radiation this morning.  Patient denies any worsening shortness of breath.  Denies any fevers.  Denies any chills.  Continues to have elevated blood sugars around the time of his chemotherapy-however currently better controlled.  ROS: A complete 10 point review of system is done which is negative except mentioned above in history of present illness  MEDICAL HISTORY:  Past Medical History:  Diagnosis Date  . COPD (chronic obstructive pulmonary disease) ([place])   . Diabetes mellitus without complication ([place])   . GERD without esophagitis   . Hepatitis C virus    history of hep C treated with Harvoni  . Hypercholesterolemia   . Hypertension   . Seasonal allergies     SURGICAL HISTORY: Past Surgical History:  Procedure Laterality Date  . COLONOSCOPY     approximately 11 years ago  . TONSILLECTOMY      SOCIAL HISTORY: Social History   Socioeconomic History  . Marital status: Married    Spouse name: Not on file  . Number of children: Not on file  . Years of education: Not on file  .  Highest education level: Not on file  Social Needs  . Financial resource strain: Not on file  . Food insecurity - worry: Not on file  . Food insecurity - inability: Not on file  . Transportation needs - medical: Not on file  . Transportation needs - non-medical: Not on file  Occupational History  . Not on file  Tobacco Use  . Smoking status: Former Smoker    Packs/day: 1.00    Years: 50.00    Pack years: 50.00    Types: Cigarettes    Last attempt to quit: 04/07/2004    Years since quitting: 12.7  . Smokeless tobacco: Never Used  Substance and Sexual Activity  . Alcohol use: No    Comment: history of alcohol use  . Drug use: No  . Sexual activity: Yes  Other Topics Concern  . Not on file  Social History Narrative  . Not on file    FAMILY HISTORY: Family History  Problem Relation Age of Onset  . Skin cancer Mother   . Ovarian cancer Sister        sister was diagnosed as a teenager  . Throat cancer Brother 47       brother #1  . Lung cancer Sister 88  . Throat cancer Brother 43       brother #2  . Skin cancer Sister     ALLERGIES:  is allergic to penicillin g; shellfish-derived  products; statins; erythromycin; tamsulosin; tuberculin ppd; and iodinated diagnostic agents.  MEDICATIONS:  Current Outpatient Medications  Medication Sig Dispense Refill  . APPLE CIDER VINEGAR PO Take 450 mg by mouth at bedtime.     Marland Kitchen aspirin EC 81 MG tablet Take 81 mg by mouth at bedtime.     . cetirizine (ZYRTEC) 10 MG tablet Take 10 mg by mouth daily.    . cholecalciferol (VITAMIN D) 1000 units tablet Take 1,000 Units by mouth daily.    . Coenzyme Q10 100 MG capsule Take 100 mg by mouth daily.     . Flaxseed, Linseed, (FLAXSEED OIL) 1000 MG CAPS Take 1,000 mg by mouth at bedtime.     . fluticasone (FLONASE) 50 MCG/ACT nasal spray Place 1 spray into both nostrils daily as needed for allergies or rhinitis.    Marland Kitchen glipiZIDE (GLUCOTROL) 5 MG tablet Take 5 mg by mouth 2 (two) times daily.    Marland Kitchen  glucagon (GLUCAGON EMERGENCY) 1 MG injection Inject 1 mg into the muscle once as needed. When blood glucose drops less than 50; and if patient has difficulty drinking. 1 each 12  . GNP GARLIC EXTRACT PO Take 1,287 mg by mouth daily.     . hydrochlorothiazide (HYDRODIURIL) 25 MG tablet Take 25 mg by mouth daily.    Marland Kitchen lidocaine-prilocaine (EMLA) cream Apply 1 application topically as needed. 30 g 3  . lisinopril (PRINIVIL,ZESTRIL) 10 MG tablet Take 10 mg by mouth daily.    . metFORMIN (GLUCOPHAGE) 1000 MG tablet Take 1,000 mg by mouth 2 (two) times daily with a meal.     . Multiple Vitamin (MULTIVITAMIN WITH MINERALS) TABS tablet Take 1 tablet by mouth daily.    Marland Kitchen omeprazole (PRILOSEC) 20 MG capsule Take 20 mg by mouth every Monday, Wednesday, and Friday.     . ondansetron (ZOFRAN) 8 MG tablet Take 1 tablet (8 mg total) by mouth every 8 (eight) hours as needed for nausea or vomiting (start 3 days; after chemo). 40 tablet 1  . prochlorperazine (COMPAZINE) 10 MG tablet Take 1 tablet (10 mg total) by mouth every 6 (six) hours as needed for nausea or vomiting. 40 tablet 1  . sucralfate (CARAFATE) 1 g tablet Take 1 tablet (1 g total) by mouth 3 (three) times daily before meals. 90 tablet 6  . ZINC-VITAMIN C MT Take 1 tablet daily by mouth.     No current facility-administered medications for this visit.       Marland Kitchen  PHYSICAL EXAMINATION: ECOG PERFORMANCE STATUS: 1 - Symptomatic but completely ambulatory  Vitals:   01/13/17 1043  BP: (!) 153/72  Pulse: 67  Resp: 20  Temp: 97.8 F (36.6 C)   Filed Weights   01/13/17 1038  Weight: 202 lb 3.2 oz (91.7 kg)    GENERAL: Well-nourished well-developed; Alert, no distress and comfortable.   With his wife.  EYES: no pallor or icterus OROPHARYNX: no thrush or ulceration; dentures.   NECK: supple, no masses felt LYMPH:  no palpable lymphadenopathy in the cervical, axillary or inguinal regions LUNGS: clear to auscultation and  No wheeze or  crackles HEART/CVS: regular rate & rhythm and no murmurs; No lower extremity edema ABDOMEN: abdomen soft, non-tender and normal bowel sounds Musculoskeletal:no cyanosis of digits and no clubbing  PSYCH: alert & oriented x 3 with fluent speech NEURO: no focal motor/sensory deficits SKIN:  no rashes or significant lesions  LABORATORY DATA:  I have reviewed the data as listed Lab Results  Component Value  Date   WBC 1.6 (L) 01/13/2017   HGB 10.9 (L) 01/13/2017   HCT 33.2 (L) 01/13/2017   MCV 89.4 01/13/2017   PLT 207 01/13/2017   Recent Labs    12/04/16 1104 12/16/16 0937 12/23/16 0942 01/01/17 1303 01/13/17 0930  NA 133* 135 134* 135 135  K 4.8 4.6 4.7 5.0 4.5  CL 102 104 102 102 105  CO2 23 23 23 24 23   GLUCOSE 173* 135* 204* 113* 165*  BUN 34* 25* 30* 27* 31*  CREATININE 1.71* 1.99* 1.91* 1.81* 1.89*  CALCIUM 8.9 8.8* 9.1 9.4 9.1  GFRNONAA 37* 31* 32* 35* 33*  GFRAA 43* 36* 38* 40* 38*  PROT 7.5 7.3  --   --  7.4  ALBUMIN 3.3* 3.3*  --   --  3.5  AST 14* 15  --   --  18  ALT 8* 8*  --   --  10*  ALKPHOS 66 71  --   --  69  BILITOT 0.2* 0.3  --   --  0.3    RADIOGRAPHIC STUDIES: I have personally reviewed the radiological images as listed and agreed with the findings in the report. No results found. IMPRESSION: 1. 6.5 cm hypermetabolic left upper lobe the lung mass partially abutting the mediastinum and left anterior hilum. Probable involvement of the left lower paratracheal lymph node. Questionable involvement of the right lower paratracheal lymph node. Assuming involvement of the left but not the right paratracheal lymph nodes, and assuming non-small cell lung cancer, appearance is compatible with T2b N2 M0 disease (stage IIIa). 2.  Aortic Atherosclerosis (ICD10-I70.0). 3. Urinary bladder wall thickening may be due to nondistention, cystitis not entirely excluded.   Electronically Signed   By: Van Clines M.D.   On: 11/06/2016 12:48  ASSESSMENT  & PLAN:   Cancer of upper lobe of left lung ([place]) # SMALL CELL LUNG CA  LIMITED STAGE- left upper lobe- on concurrent carbo [09/19]-Etop with RT [started 27th sep]. patient status post cycle # 2 appx 3 weeks ago.  # HOLD chemo-; WBC- 1.6; ANC-800; add onpro starting with next cycle.  We will plan to start chemotherapy in 1 week.  # Growth factor-Neulasta/On pro would be given as prophylaxis for chemotherapy-induced neutropenia to prevent febrile neutropenias. Discussed potential side effect- myalgias/arthralgias- recommend Claritin for 4 days.   # Anemia- CKD/chemo-stable -10-11 on PO iron.   # constipation- on miralax; better.   # Diabetes- brittle; recommend close monitoring; improved; BG-114 today; post chemo- upto 400s; will cut down steroids.   # HOLD chemo today; 1 week-Labs/carbo-etop; with day-3; with onpro; follow up in 10days post chemo/labs;No chemo.   All questions were answered. The patient knows to call the clinic with any problems, questions or concerns.    Cammie Sickle, MD 01/14/2017 8:13 AM

## 2017-01-13 NOTE — Progress Notes (Signed)
Patient educated on neutropenic precautions.

## 2017-01-14 ENCOUNTER — Inpatient Hospital Stay: Payer: Medicare Other

## 2017-01-15 ENCOUNTER — Inpatient Hospital Stay: Payer: Medicare Other

## 2017-01-20 ENCOUNTER — Other Ambulatory Visit: Payer: Self-pay

## 2017-01-20 ENCOUNTER — Inpatient Hospital Stay: Payer: Medicare Other

## 2017-01-20 ENCOUNTER — Encounter: Payer: Self-pay | Admitting: Internal Medicine

## 2017-01-20 ENCOUNTER — Inpatient Hospital Stay (HOSPITAL_BASED_OUTPATIENT_CLINIC_OR_DEPARTMENT_OTHER): Payer: Medicare Other | Admitting: Internal Medicine

## 2017-01-20 VITALS — BP 143/92 | HR 70 | Temp 97.5°F | Resp 18 | Ht 75.0 in | Wt 200.2 lb

## 2017-01-20 DIAGNOSIS — Z800 Family history of malignant neoplasm of digestive organs: Secondary | ICD-10-CM

## 2017-01-20 DIAGNOSIS — Z8041 Family history of malignant neoplasm of ovary: Secondary | ICD-10-CM

## 2017-01-20 DIAGNOSIS — Z880 Allergy status to penicillin: Secondary | ICD-10-CM

## 2017-01-20 DIAGNOSIS — Z923 Personal history of irradiation: Secondary | ICD-10-CM | POA: Diagnosis not present

## 2017-01-20 DIAGNOSIS — Z7984 Long term (current) use of oral hypoglycemic drugs: Secondary | ICD-10-CM | POA: Diagnosis not present

## 2017-01-20 DIAGNOSIS — Z7982 Long term (current) use of aspirin: Secondary | ICD-10-CM | POA: Diagnosis not present

## 2017-01-20 DIAGNOSIS — E7800 Pure hypercholesterolemia, unspecified: Secondary | ICD-10-CM

## 2017-01-20 DIAGNOSIS — J449 Chronic obstructive pulmonary disease, unspecified: Secondary | ICD-10-CM | POA: Diagnosis not present

## 2017-01-20 DIAGNOSIS — I129 Hypertensive chronic kidney disease with stage 1 through stage 4 chronic kidney disease, or unspecified chronic kidney disease: Secondary | ICD-10-CM

## 2017-01-20 DIAGNOSIS — Z79899 Other long term (current) drug therapy: Secondary | ICD-10-CM

## 2017-01-20 DIAGNOSIS — K219 Gastro-esophageal reflux disease without esophagitis: Secondary | ICD-10-CM | POA: Diagnosis not present

## 2017-01-20 DIAGNOSIS — C3412 Malignant neoplasm of upper lobe, left bronchus or lung: Secondary | ICD-10-CM

## 2017-01-20 DIAGNOSIS — K5900 Constipation, unspecified: Secondary | ICD-10-CM | POA: Diagnosis not present

## 2017-01-20 DIAGNOSIS — Z801 Family history of malignant neoplasm of trachea, bronchus and lung: Secondary | ICD-10-CM

## 2017-01-20 DIAGNOSIS — I700 Atherosclerosis of aorta: Secondary | ICD-10-CM

## 2017-01-20 DIAGNOSIS — Z87891 Personal history of nicotine dependence: Secondary | ICD-10-CM

## 2017-01-20 DIAGNOSIS — Z5111 Encounter for antineoplastic chemotherapy: Secondary | ICD-10-CM | POA: Diagnosis not present

## 2017-01-20 DIAGNOSIS — E1122 Type 2 diabetes mellitus with diabetic chronic kidney disease: Secondary | ICD-10-CM | POA: Diagnosis not present

## 2017-01-20 DIAGNOSIS — N183 Chronic kidney disease, stage 3 (moderate): Secondary | ICD-10-CM

## 2017-01-20 DIAGNOSIS — Z808 Family history of malignant neoplasm of other organs or systems: Secondary | ICD-10-CM

## 2017-01-20 LAB — COMPREHENSIVE METABOLIC PANEL
ALT: 10 U/L — ABNORMAL LOW (ref 17–63)
ANION GAP: 7 (ref 5–15)
AST: 19 U/L (ref 15–41)
Albumin: 3.7 g/dL (ref 3.5–5.0)
Alkaline Phosphatase: 65 U/L (ref 38–126)
BILIRUBIN TOTAL: 0.4 mg/dL (ref 0.3–1.2)
BUN: 31 mg/dL — AB (ref 6–20)
CO2: 25 mmol/L (ref 22–32)
Calcium: 9.4 mg/dL (ref 8.9–10.3)
Chloride: 102 mmol/L (ref 101–111)
Creatinine, Ser: 1.96 mg/dL — ABNORMAL HIGH (ref 0.61–1.24)
GFR, EST AFRICAN AMERICAN: 36 mL/min — AB (ref >60)
GFR, EST NON AFRICAN AMERICAN: 31 mL/min — AB (ref >60)
Glucose, Bld: 214 mg/dL — ABNORMAL HIGH (ref 65–99)
POTASSIUM: 4.8 mmol/L (ref 3.5–5.1)
Sodium: 134 mmol/L — ABNORMAL LOW (ref 135–145)
TOTAL PROTEIN: 7.4 g/dL (ref 6.5–8.1)

## 2017-01-20 LAB — CBC WITH DIFFERENTIAL/PLATELET
BASOS ABS: 0 10*3/uL (ref 0–0.1)
Basophils Relative: 2 %
EOS PCT: 4 %
Eosinophils Absolute: 0.1 10*3/uL (ref 0–0.7)
HCT: 35 % — ABNORMAL LOW (ref 40.0–52.0)
Hemoglobin: 11.5 g/dL — ABNORMAL LOW (ref 13.0–18.0)
LYMPHS PCT: 14 %
Lymphs Abs: 0.4 10*3/uL — ABNORMAL LOW (ref 1.0–3.6)
MCH: 29.5 pg (ref 26.0–34.0)
MCHC: 32.9 g/dL (ref 32.0–36.0)
MCV: 89.7 fL (ref 80.0–100.0)
Monocytes Absolute: 0.5 10*3/uL (ref 0.2–1.0)
Monocytes Relative: 18 %
NEUTROS ABS: 1.8 10*3/uL (ref 1.4–6.5)
NEUTROS PCT: 62 %
PLATELETS: 267 10*3/uL (ref 150–440)
RBC: 3.9 MIL/uL — AB (ref 4.40–5.90)
RDW: 18.6 % — ABNORMAL HIGH (ref 11.5–14.5)
WBC: 2.9 10*3/uL — AB (ref 3.8–10.6)

## 2017-01-20 MED ORDER — SODIUM CHLORIDE 0.9 % IV SOLN
Freq: Once | INTRAVENOUS | Status: AC
  Administered 2017-01-20: 11:00:00 via INTRAVENOUS
  Filled 2017-01-20: qty 1000

## 2017-01-20 MED ORDER — SODIUM CHLORIDE 0.9 % IV SOLN
75.0000 mg/m2 | Freq: Once | INTRAVENOUS | Status: AC
  Administered 2017-01-20: 160 mg via INTRAVENOUS
  Filled 2017-01-20: qty 8

## 2017-01-20 MED ORDER — HEPARIN SOD (PORK) LOCK FLUSH 100 UNIT/ML IV SOLN
500.0000 [IU] | Freq: Once | INTRAVENOUS | Status: AC | PRN
  Administered 2017-01-20: 500 [IU]
  Filled 2017-01-20: qty 5

## 2017-01-20 MED ORDER — SODIUM CHLORIDE 0.9% FLUSH
10.0000 mL | INTRAVENOUS | Status: DC | PRN
  Administered 2017-01-20: 10 mL
  Filled 2017-01-20: qty 10

## 2017-01-20 MED ORDER — PALONOSETRON HCL INJECTION 0.25 MG/5ML
0.2500 mg | Freq: Once | INTRAVENOUS | Status: AC
  Administered 2017-01-20: 0.25 mg via INTRAVENOUS
  Filled 2017-01-20: qty 5

## 2017-01-20 MED ORDER — SODIUM CHLORIDE 0.9 % IV SOLN
310.0000 mg | Freq: Once | INTRAVENOUS | Status: AC
  Administered 2017-01-20: 310 mg via INTRAVENOUS
  Filled 2017-01-20: qty 31

## 2017-01-20 NOTE — Assessment & Plan Note (Addendum)
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 patient status post cycle # 2 appx 4 weeks ago.  # proceed with chemo-; WBC- 2.9; ANC-1.8; add onpro with cycle. Discussed er: claritin x7 days.   # Anemia- CKD/chemo-stable -10-11 on PO iron.   # constipation- on miralax; better.   # Diabetes- brittle; recommend close monitoring; improved; BG-214today; post chemo- upto 450s; will cut down steroids.   # labs- 10 days; 3 weeks carbo-etop; day-2 on pro; labs/MD.

## 2017-01-20 NOTE — Progress Notes (Signed)
[facility] NOTE  Patient Care Team: Marguerita Merles, MD as PCP - General (Family Medicine) Telford Nab, RN as Registered Nurse  CHIEF COMPLAINTS/PURPOSE OF CONSULTATION: LEFT LUNG MASS     Oncology History   # AUG 2018- LIMITED STAGE SMALL CELL LUNG CA ;[ T4N3M0] LEFT UPPER LUNG MASS- ~5-5.5cm; positive for M-LN. PET 2018- no distant mets.   # SEP 19th 2018- CARBO-ETOP-RT [finished RT- 01/13/2017]   # Sep 2018-MRI brain-NEG  # Hep C [? Immunization in [employer]; s/p Harvoni at [place]; 2017]; CKD Stage III     Cancer of upper lobe of left lung (HCC)   HISTORY OF PRESENTING ILLNESS:  Edwin Jones 76 y.o.  male recently diagnosed Limited stage small cell lung cancer- currently on Carbo etoposide along with radiation- Currently status post cycle #2 approximately 3 weeks ago is here for follow-up.  Cycle #2 was delayed because of neutropenia by 1 week. Patient finished radiation approximately 7 days ago.  Patient denies any worsening shortness of breath.  Denies any fevers.  Denies any chills.  Continues to have elevated blood sugars around the time of his chemotherapy-however currently better controlled.  ROS: A complete 10 point review of system is done which is negative except mentioned above in history of present illness  MEDICAL HISTORY:  Past Medical History:  Diagnosis Date  . COPD (chronic obstructive pulmonary disease) ([place])   . Diabetes mellitus without complication ([place])   . GERD without esophagitis   . Hepatitis C virus    history of hep C treated with Harvoni  . Hypercholesterolemia   . Hypertension   . Seasonal allergies     SURGICAL HISTORY: Past Surgical History:  Procedure Laterality Date  . COLONOSCOPY     approximately 11 years ago  . FLEXIBLE BRONCHOSCOPY N/A 11/05/2016   Procedure: FLEXIBLE BRONCHOSCOPY;  Surgeon: Wilhelmina Mcardle, MD;  Location: [facility] ORS;  Service: Pulmonary;  Laterality: N/A;  . PORTA CATH INSERTION N/A  11/23/2016   Procedure: Glori Luis Cath Insertion;  Surgeon: Algernon Huxley, MD;  Location: [place] [facility];  Service: Cardiovascular;  Laterality: N/A;  . TONSILLECTOMY      SOCIAL HISTORY: Social History   Socioeconomic History  . Marital status: Married    Spouse name: Not on file  . Number of children: Not on file  . Years of education: Not on file  . Highest education level: Not on file  Social Needs  . Financial resource strain: Not on file  . Food insecurity - worry: Not on file  . Food insecurity - inability: Not on file  . Transportation needs - medical: Not on file  . Transportation needs - non-medical: Not on file  Occupational History  . Not on file  Tobacco Use  . Smoking status: Former Smoker    Packs/day: 1.00    Years: 50.00    Pack years: 50.00    Types: Cigarettes    Last attempt to quit: 04/07/2004    Years since quitting: 12.8  . Smokeless tobacco: Never Used  Substance and Sexual Activity  . Alcohol use: No    Comment: history of alcohol use  . Drug use: No  . Sexual activity: Yes  Other Topics Concern  . Not on file  Social History Narrative  . Not on file    FAMILY HISTORY: Family History  Problem Relation Age of Onset  . Skin cancer Mother   . Ovarian cancer Sister  sister was diagnosed as a teenager  . Throat cancer Brother 65       brother #1  . Lung cancer Sister 88  . Throat cancer Brother 69       brother #2  . Skin cancer Sister     ALLERGIES:  is allergic to penicillin g; shellfish-derived products; statins; erythromycin; tamsulosin; tuberculin ppd; and iodinated diagnostic agents.  MEDICATIONS:  Current Outpatient Medications  Medication Sig Dispense Refill  . APPLE CIDER VINEGAR PO Take 450 mg by mouth at bedtime.     Marland Kitchen aspirin EC 81 MG tablet Take 81 mg by mouth at bedtime.     . cetirizine (ZYRTEC) 10 MG tablet Take 10 mg by mouth daily.    . cholecalciferol (VITAMIN D) 1000 units tablet Take 1,000 Units by mouth  daily.    . Coenzyme Q10 100 MG capsule Take 100 mg by mouth daily.     . Flaxseed, Linseed, (FLAXSEED OIL) 1000 MG CAPS Take 1,000 mg by mouth at bedtime.     . fluticasone (FLONASE) 50 MCG/ACT nasal spray Place 1 spray into both nostrils daily as needed for allergies or rhinitis.    Marland Kitchen glipiZIDE (GLUCOTROL) 5 MG tablet Take 5 mg by mouth 2 (two) times daily.    Marland Kitchen GNP GARLIC EXTRACT PO Take 4,128 mg by mouth daily.     . hydrochlorothiazide (HYDRODIURIL) 25 MG tablet Take 25 mg by mouth daily.    Marland Kitchen lidocaine-prilocaine (EMLA) cream Apply 1 application topically as needed. 30 g 3  . lisinopril (PRINIVIL,ZESTRIL) 10 MG tablet Take 10 mg by mouth daily.    . metFORMIN (GLUCOPHAGE) 1000 MG tablet Take 1,000 mg by mouth 2 (two) times daily with a meal.     . Multiple Vitamin (MULTIVITAMIN WITH MINERALS) TABS tablet Take 1 tablet by mouth daily.    Marland Kitchen omeprazole (PRILOSEC) 20 MG capsule Take 20 mg by mouth every Monday, Wednesday, and Friday.     Marland Kitchen ZINC-VITAMIN C MT Take 1 tablet daily by mouth.    Marland Kitchen glucagon (GLUCAGON EMERGENCY) 1 MG injection Inject 1 mg into the muscle once as needed. When blood glucose drops less than 50; and if patient has difficulty drinking. (Patient not taking: Reported on 01/20/2017) 1 each 12  . ondansetron (ZOFRAN) 8 MG tablet Take 1 tablet (8 mg total) by mouth every 8 (eight) hours as needed for nausea or vomiting (start 3 days; after chemo). (Patient not taking: Reported on 01/20/2017) 40 tablet 1  . prochlorperazine (COMPAZINE) 10 MG tablet Take 1 tablet (10 mg total) by mouth every 6 (six) hours as needed for nausea or vomiting. (Patient not taking: Reported on 01/20/2017) 40 tablet 1  . sucralfate (CARAFATE) 1 g tablet Take 1 tablet (1 g total) by mouth 3 (three) times daily before meals. (Patient not taking: Reported on 01/20/2017) 90 tablet 6   No current facility-administered medications for this visit.       Marland Kitchen  PHYSICAL EXAMINATION: ECOG PERFORMANCE STATUS: 1 -  Symptomatic but completely ambulatory  Vitals:   01/20/17 1005  BP: (!) 143/92  Pulse: 70  Resp: 18  Temp: (!) 97.5 F (36.4 C)   Filed Weights   01/20/17 1005  Weight: 200 lb 3.2 oz (90.8 kg)    GENERAL: Well-nourished well-developed; Alert, no distress and comfortable.   With his wife.  EYES: no pallor or icterus OROPHARYNX: no thrush or ulceration; dentures.   NECK: supple, no masses felt LYMPH:  no palpable lymphadenopathy  in the cervical, axillary or inguinal regions LUNGS: clear to auscultation and  No wheeze or crackles HEART/CVS: regular rate & rhythm and no murmurs; No lower extremity edema ABDOMEN: abdomen soft, non-tender and normal bowel sounds Musculoskeletal:no cyanosis of digits and no clubbing  PSYCH: alert & oriented x 3 with fluent speech NEURO: no focal motor/sensory deficits SKIN:  no rashes or significant lesions  LABORATORY DATA:  I have reviewed the data as listed Lab Results  Component Value Date   WBC 11.7 (H) 02/01/2017   HGB 11.5 (L) 02/01/2017   HCT 35.2 (L) 02/01/2017   MCV 91.6 02/01/2017   PLT 185 02/01/2017   Recent Labs    01/13/17 0930 01/20/17 0951 02/01/17 1150  NA 135 134* 136  K 4.5 4.8 4.7  CL 105 102 104  CO2 _0 GLUCOSE 165* 214* 109*  BUN 31* 31* 25*  CREATININE 1.89* 1.96* 1.89*  CALCIUM 9.1 9.4 9.2  GFRNONAA 33* 31* 33*  GFRAA 38* 36* 38*  PROT 7.4 7.4 7.5  ALBUMIN 3.5 3.7 3.7  AST _1 ALT 10* 10* 9*  ALKPHOS 69 65 95  BILITOT 0.3 0.4 0.3    RADIOGRAPHIC STUDIES: I have personally reviewed the radiological images as listed and agreed with the findings in the report. No results found. IMPRESSION: 1. 6.5 cm hypermetabolic left upper lobe the lung mass partially abutting the mediastinum and left anterior hilum. Probable involvement of the left lower paratracheal lymph node. Questionable involvement of the right lower paratracheal lymph node. Assuming involvement of the left but not the right  paratracheal lymph nodes, and assuming non-small cell lung cancer, appearance is compatible with T2b N2 M0 disease (stage IIIa). 2.  Aortic Atherosclerosis (ICD10-I70.0). 3. Urinary bladder wall thickening may be due to nondistention, cystitis not entirely excluded.   Electronically Signed   By: Van Clines M.D.   On: 11/06/2016 12:48  ASSESSMENT & PLAN:   Cancer of upper lobe of left lung ([place]) # SMALL CELL LUNG CA  LIMITED STAGE- left upper lobe- on concurrent carbo [09/19]-Etop with RT [started 27th sep]. patient status post cycle # 2 appx 4 weeks ago.  # proceed with chemo-; WBC- 2.9; ANC-1.8; add onpro with cycle. Discussed er: claritin x7 days.   # Anemia- CKD/chemo-stable -10-11 on PO iron.   # constipation- on miralax; better.   # Diabetes- brittle; recommend close monitoring; improved; BG-214today; post chemo- upto 450s; will cut down steroids.   # labs- 10 days; 3 weeks carbo-etop; day-2 on pro; labs/MD.   All questions were answered. The patient knows to call the clinic with any problems, questions or concerns.    Cammie Sickle, MD 02/02/2017 8:26 PM

## 2017-01-21 ENCOUNTER — Inpatient Hospital Stay: Payer: Medicare Other

## 2017-01-21 VITALS — BP 150/80 | HR 72 | Temp 97.1°F | Resp 20

## 2017-01-21 DIAGNOSIS — Z5111 Encounter for antineoplastic chemotherapy: Secondary | ICD-10-CM | POA: Diagnosis not present

## 2017-01-21 DIAGNOSIS — C3412 Malignant neoplasm of upper lobe, left bronchus or lung: Secondary | ICD-10-CM

## 2017-01-21 MED ORDER — SODIUM CHLORIDE 0.9 % IV SOLN
Freq: Once | INTRAVENOUS | Status: AC
  Administered 2017-01-21: 14:00:00 via INTRAVENOUS
  Filled 2017-01-21: qty 1000

## 2017-01-21 MED ORDER — HEPARIN SOD (PORK) LOCK FLUSH 100 UNIT/ML IV SOLN
500.0000 [IU] | Freq: Once | INTRAVENOUS | Status: AC | PRN
  Administered 2017-01-21: 500 [IU]
  Filled 2017-01-21: qty 5

## 2017-01-21 MED ORDER — SODIUM CHLORIDE 0.9 % IV SOLN: 4.0000 mg | Freq: Once | INTRAVENOUS | Status: DC

## 2017-01-21 MED ORDER — DEXAMETHASONE SODIUM PHOSPHATE 10 MG/ML IJ SOLN
4.0000 mg | Freq: Once | INTRAMUSCULAR | Status: AC
  Administered 2017-01-21: 4 mg via INTRAVENOUS
  Filled 2017-01-21: qty 1

## 2017-01-21 MED ORDER — SODIUM CHLORIDE 0.9 % IV SOLN
75.0000 mg/m2 | Freq: Once | INTRAVENOUS | Status: AC
  Administered 2017-01-21: 160 mg via INTRAVENOUS
  Filled 2017-01-21: qty 8

## 2017-01-22 ENCOUNTER — Inpatient Hospital Stay: Payer: Medicare Other

## 2017-01-22 VITALS — BP 146/76 | HR 70 | Resp 20

## 2017-01-22 DIAGNOSIS — Z5111 Encounter for antineoplastic chemotherapy: Secondary | ICD-10-CM | POA: Diagnosis not present

## 2017-01-22 DIAGNOSIS — C3412 Malignant neoplasm of upper lobe, left bronchus or lung: Secondary | ICD-10-CM

## 2017-01-22 MED ORDER — PEGFILGRASTIM 6 MG/0.6ML ~~LOC~~ PSKT
6.0000 mg | PREFILLED_SYRINGE | Freq: Once | SUBCUTANEOUS | Status: AC
  Administered 2017-01-22: 6 mg via SUBCUTANEOUS
  Filled 2017-01-22: qty 0.6

## 2017-01-22 MED ORDER — SODIUM CHLORIDE 0.9 % IV SOLN
Freq: Once | INTRAVENOUS | Status: AC
  Administered 2017-01-22: 14:00:00 via INTRAVENOUS
  Filled 2017-01-22: qty 1000

## 2017-01-22 MED ORDER — HEPARIN SOD (PORK) LOCK FLUSH 100 UNIT/ML IV SOLN
500.0000 [IU] | Freq: Once | INTRAVENOUS | Status: AC | PRN
  Administered 2017-01-22: 500 [IU]
  Filled 2017-01-22: qty 5

## 2017-01-22 MED ORDER — DEXAMETHASONE SODIUM PHOSPHATE 10 MG/ML IJ SOLN
4.0000 mg | Freq: Once | INTRAMUSCULAR | Status: AC
  Administered 2017-01-22: 4 mg via INTRAVENOUS
  Filled 2017-01-22: qty 1

## 2017-01-22 MED ORDER — SODIUM CHLORIDE 0.9 % IV SOLN: 4.0000 mg | Freq: Once | INTRAVENOUS | Status: DC

## 2017-01-22 MED ORDER — ETOPOSIDE CHEMO INJECTION 1 GM/50ML
75.0000 mg/m2 | Freq: Once | INTRAVENOUS | Status: AC
  Administered 2017-01-22: 160 mg via INTRAVENOUS
  Filled 2017-01-22: qty 8

## 2017-01-27 ENCOUNTER — Other Ambulatory Visit: Payer: Medicare Other

## 2017-01-27 ENCOUNTER — Ambulatory Visit: Payer: Medicare Other | Admitting: Internal Medicine

## 2017-02-01 ENCOUNTER — Inpatient Hospital Stay: Payer: Medicare Other

## 2017-02-01 DIAGNOSIS — Z5111 Encounter for antineoplastic chemotherapy: Secondary | ICD-10-CM | POA: Diagnosis not present

## 2017-02-01 DIAGNOSIS — C3412 Malignant neoplasm of upper lobe, left bronchus or lung: Secondary | ICD-10-CM

## 2017-02-01 LAB — COMPREHENSIVE METABOLIC PANEL
ALT: 9 U/L — ABNORMAL LOW (ref 17–63)
ANION GAP: 8 (ref 5–15)
AST: 16 U/L (ref 15–41)
Albumin: 3.7 g/dL (ref 3.5–5.0)
Alkaline Phosphatase: 95 U/L (ref 38–126)
BILIRUBIN TOTAL: 0.3 mg/dL (ref 0.3–1.2)
BUN: 25 mg/dL — AB (ref 6–20)
CO2: 24 mmol/L (ref 22–32)
Calcium: 9.2 mg/dL (ref 8.9–10.3)
Chloride: 104 mmol/L (ref 101–111)
Creatinine, Ser: 1.89 mg/dL — ABNORMAL HIGH (ref 0.61–1.24)
GFR calc Af Amer: 38 mL/min — ABNORMAL LOW (ref >60)
GFR, EST NON AFRICAN AMERICAN: 33 mL/min — AB (ref >60)
Glucose, Bld: 109 mg/dL — ABNORMAL HIGH (ref 65–99)
POTASSIUM: 4.7 mmol/L (ref 3.5–5.1)
Sodium: 136 mmol/L (ref 135–145)
TOTAL PROTEIN: 7.5 g/dL (ref 6.5–8.1)

## 2017-02-01 LAB — CBC WITH DIFFERENTIAL/PLATELET
Basophils Absolute: 0.1 10*3/uL (ref 0–0.1)
Basophils Relative: 1 %
EOS PCT: 1 %
Eosinophils Absolute: 0.1 10*3/uL (ref 0–0.7)
HCT: 35.2 % — ABNORMAL LOW (ref 40.0–52.0)
Hemoglobin: 11.5 g/dL — ABNORMAL LOW (ref 13.0–18.0)
LYMPHS ABS: 0.7 10*3/uL — AB (ref 1.0–3.6)
LYMPHS PCT: 6 %
MCH: 29.9 pg (ref 26.0–34.0)
MCHC: 32.6 g/dL (ref 32.0–36.0)
MCV: 91.6 fL (ref 80.0–100.0)
MONO ABS: 1.7 10*3/uL — AB (ref 0.2–1.0)
Monocytes Relative: 14 %
Neutro Abs: 9.1 10*3/uL — ABNORMAL HIGH (ref 1.4–6.5)
Neutrophils Relative %: 78 %
PLATELETS: 185 10*3/uL (ref 150–440)
RBC: 3.85 MIL/uL — ABNORMAL LOW (ref 4.40–5.90)
RDW: 18.9 % — AB (ref 11.5–14.5)
WBC: 11.7 10*3/uL — ABNORMAL HIGH (ref 3.8–10.6)

## 2017-02-10 ENCOUNTER — Inpatient Hospital Stay: Payer: Medicare Other

## 2017-02-10 ENCOUNTER — Inpatient Hospital Stay: Payer: Medicare Other | Attending: Internal Medicine | Admitting: Internal Medicine

## 2017-02-10 ENCOUNTER — Other Ambulatory Visit: Payer: Self-pay

## 2017-02-10 ENCOUNTER — Encounter: Payer: Self-pay | Admitting: Internal Medicine

## 2017-02-10 VITALS — BP 152/81 | HR 67 | Temp 97.8°F | Resp 20 | Ht 75.0 in | Wt 207.0 lb

## 2017-02-10 DIAGNOSIS — Z800 Family history of malignant neoplasm of digestive organs: Secondary | ICD-10-CM

## 2017-02-10 DIAGNOSIS — C3412 Malignant neoplasm of upper lobe, left bronchus or lung: Secondary | ICD-10-CM | POA: Diagnosis not present

## 2017-02-10 DIAGNOSIS — Z7689 Persons encountering health services in other specified circumstances: Secondary | ICD-10-CM | POA: Insufficient documentation

## 2017-02-10 DIAGNOSIS — I129 Hypertensive chronic kidney disease with stage 1 through stage 4 chronic kidney disease, or unspecified chronic kidney disease: Secondary | ICD-10-CM | POA: Diagnosis not present

## 2017-02-10 DIAGNOSIS — I700 Atherosclerosis of aorta: Secondary | ICD-10-CM | POA: Insufficient documentation

## 2017-02-10 DIAGNOSIS — K219 Gastro-esophageal reflux disease without esophagitis: Secondary | ICD-10-CM | POA: Insufficient documentation

## 2017-02-10 DIAGNOSIS — Z7982 Long term (current) use of aspirin: Secondary | ICD-10-CM | POA: Diagnosis not present

## 2017-02-10 DIAGNOSIS — E7800 Pure hypercholesterolemia, unspecified: Secondary | ICD-10-CM | POA: Diagnosis not present

## 2017-02-10 DIAGNOSIS — J449 Chronic obstructive pulmonary disease, unspecified: Secondary | ICD-10-CM | POA: Diagnosis not present

## 2017-02-10 DIAGNOSIS — Z79899 Other long term (current) drug therapy: Secondary | ICD-10-CM | POA: Diagnosis not present

## 2017-02-10 DIAGNOSIS — Z880 Allergy status to penicillin: Secondary | ICD-10-CM | POA: Insufficient documentation

## 2017-02-10 DIAGNOSIS — Z808 Family history of malignant neoplasm of other organs or systems: Secondary | ICD-10-CM | POA: Insufficient documentation

## 2017-02-10 DIAGNOSIS — E1122 Type 2 diabetes mellitus with diabetic chronic kidney disease: Secondary | ICD-10-CM | POA: Insufficient documentation

## 2017-02-10 DIAGNOSIS — Z87891 Personal history of nicotine dependence: Secondary | ICD-10-CM | POA: Diagnosis not present

## 2017-02-10 DIAGNOSIS — Z8041 Family history of malignant neoplasm of ovary: Secondary | ICD-10-CM | POA: Insufficient documentation

## 2017-02-10 DIAGNOSIS — Z923 Personal history of irradiation: Secondary | ICD-10-CM

## 2017-02-10 DIAGNOSIS — N3289 Other specified disorders of bladder: Secondary | ICD-10-CM | POA: Insufficient documentation

## 2017-02-10 DIAGNOSIS — Z5111 Encounter for antineoplastic chemotherapy: Secondary | ICD-10-CM | POA: Insufficient documentation

## 2017-02-10 DIAGNOSIS — Z801 Family history of malignant neoplasm of trachea, bronchus and lung: Secondary | ICD-10-CM | POA: Diagnosis not present

## 2017-02-10 DIAGNOSIS — D631 Anemia in chronic kidney disease: Secondary | ICD-10-CM | POA: Diagnosis not present

## 2017-02-10 DIAGNOSIS — N183 Chronic kidney disease, stage 3 (moderate): Secondary | ICD-10-CM

## 2017-02-10 LAB — CBC WITH DIFFERENTIAL/PLATELET
BASOS PCT: 1 %
Basophils Absolute: 0.1 10*3/uL (ref 0–0.1)
Eosinophils Absolute: 0.1 10*3/uL (ref 0–0.7)
Eosinophils Relative: 2 %
HCT: 34 % — ABNORMAL LOW (ref 40.0–52.0)
HEMOGLOBIN: 11.3 g/dL — AB (ref 13.0–18.0)
Lymphocytes Relative: 9 %
Lymphs Abs: 0.5 10*3/uL — ABNORMAL LOW (ref 1.0–3.6)
MCH: 30.4 pg (ref 26.0–34.0)
MCHC: 33.1 g/dL (ref 32.0–36.0)
MCV: 91.7 fL (ref 80.0–100.0)
MONO ABS: 1.2 10*3/uL — AB (ref 0.2–1.0)
Monocytes Relative: 21 %
NEUTROS ABS: 3.7 10*3/uL (ref 1.4–6.5)
NEUTROS PCT: 67 %
PLATELETS: 245 10*3/uL (ref 150–440)
RBC: 3.71 MIL/uL — AB (ref 4.40–5.90)
RDW: 19.3 % — ABNORMAL HIGH (ref 11.5–14.5)
WBC: 5.6 10*3/uL (ref 3.8–10.6)

## 2017-02-10 LAB — COMPREHENSIVE METABOLIC PANEL
ALK PHOS: 88 U/L (ref 38–126)
ALT: 13 U/L — AB (ref 17–63)
ANION GAP: 6 (ref 5–15)
AST: 18 U/L (ref 15–41)
Albumin: 3.6 g/dL (ref 3.5–5.0)
BUN: 33 mg/dL — ABNORMAL HIGH (ref 6–20)
CALCIUM: 8.9 mg/dL (ref 8.9–10.3)
CO2: 24 mmol/L (ref 22–32)
CREATININE: 2.01 mg/dL — AB (ref 0.61–1.24)
Chloride: 105 mmol/L (ref 101–111)
GFR, EST AFRICAN AMERICAN: 35 mL/min — AB (ref >60)
GFR, EST NON AFRICAN AMERICAN: 30 mL/min — AB (ref >60)
Glucose, Bld: 172 mg/dL — ABNORMAL HIGH (ref 65–99)
Potassium: 4.8 mmol/L (ref 3.5–5.1)
Sodium: 135 mmol/L (ref 135–145)
TOTAL PROTEIN: 7.6 g/dL (ref 6.5–8.1)
Total Bilirubin: 0.3 mg/dL (ref 0.3–1.2)

## 2017-02-10 MED ORDER — SODIUM CHLORIDE 0.9 % IV SOLN
Freq: Once | INTRAVENOUS | Status: AC
  Administered 2017-02-10: 11:00:00 via INTRAVENOUS
  Filled 2017-02-10: qty 1000

## 2017-02-10 MED ORDER — HEPARIN SOD (PORK) LOCK FLUSH 100 UNIT/ML IV SOLN
500.0000 [IU] | Freq: Once | INTRAVENOUS | Status: AC
  Administered 2017-02-10: 500 [IU] via INTRAVENOUS
  Filled 2017-02-10: qty 5

## 2017-02-10 MED ORDER — SODIUM CHLORIDE 0.9 % IV SOLN
310.0000 mg | Freq: Once | INTRAVENOUS | Status: AC
  Administered 2017-02-10: 310 mg via INTRAVENOUS
  Filled 2017-02-10: qty 31

## 2017-02-10 MED ORDER — PALONOSETRON HCL INJECTION 0.25 MG/5ML
0.2500 mg | Freq: Once | INTRAVENOUS | Status: AC
  Administered 2017-02-10: 0.25 mg via INTRAVENOUS
  Filled 2017-02-10: qty 5

## 2017-02-10 MED ORDER — SODIUM CHLORIDE 0.9 % IV SOLN
75.0000 mg/m2 | Freq: Once | INTRAVENOUS | Status: AC
  Administered 2017-02-10: 160 mg via INTRAVENOUS
  Filled 2017-02-10: qty 8

## 2017-02-10 MED ORDER — SODIUM CHLORIDE 0.9% FLUSH
10.0000 mL | Freq: Once | INTRAVENOUS | Status: AC
  Administered 2017-02-10: 10 mL via INTRAVENOUS
  Filled 2017-02-10: qty 10

## 2017-02-10 NOTE — Progress Notes (Signed)
Patient here for follow-up for lung cancer and chemotherapy. He has no medical complaints today. He reports adjustments to his diabetes medications-this was reconciled in his MAR.

## 2017-02-10 NOTE — Assessment & Plan Note (Addendum)
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 patient status post cycle # 3 appx 3 weeks ago.  # proceed with chemo # 4 today; CBC CMP are reviewed; adequate today; no contraindications to chemotherapy. Proceed with treatment today.  Discussed with the patient and wife that hopefully this will be his last cycle of chemotherapy.  We will plan to get a CT scan in approximately 6 weeks.  # Anemia- CKD/chemo-stable -10-11 on PO iron.  Stable.  # Diabetes- brittle; recommend close monitoring; improved; BG-214today;improved off metforminl on trajenda per PCP.  #Kidney creatinine-2.01; stable around baseline.  Monitor for now.  # labs in 1 week; follow up in 6 weeks/MD/labs- port flush; CT scan few days.  prior; port flush.

## 2017-02-10 NOTE — Progress Notes (Signed)
[facility] NOTE  Patient Care Team: Marguerita Merles, MD as PCP - General (Family Medicine) Telford Nab, RN as Registered Nurse  CHIEF COMPLAINTS/PURPOSE OF CONSULTATION: LEFT LUNG MASS     Oncology History   # AUG 2018- LIMITED STAGE SMALL CELL LUNG CA ;[ T4N3M0] LEFT UPPER LUNG MASS- ~5-5.5cm; positive for M-LN. PET 2018- no distant mets.   # SEP 19th 2018- CARBO-ETOP-RT [finished RT- 01/13/2017]   # Sep 2018-MRI brain-NEG  # Hep C [? Immunization in [employer]; s/p Harvoni at [place]; 2017]; CKD Stage III     Cancer of upper lobe of left lung (HCC)   HISTORY OF PRESENTING ILLNESS:  Edwin Jones 76 y.o.  male recently diagnosed Limited stage small cell lung cancer- currently on Carbo etoposide along with radiation- Currently status post cycle #3 approximately 3 weeks ago is here for follow-up.  Patient denies any worsening shortness of breath.  Denies any fevers.  Denies any chills.  Blood sugars are better controlled; he has been taken off his metformin because of renal insufficiency. He has been started on Tradjenta by his PCP.   ROS: A complete 10 point review of system is done which is negative except mentioned above in history of present illness  MEDICAL HISTORY:  Past Medical History:  Diagnosis Date  . COPD (chronic obstructive pulmonary disease) ([place])   . Diabetes mellitus without complication ([place])   . GERD without esophagitis   . Hepatitis C virus    history of hep C treated with Harvoni  . Hypercholesterolemia   . Hypertension   . Seasonal allergies     SURGICAL HISTORY: Past Surgical History:  Procedure Laterality Date  . COLONOSCOPY     approximately 11 years ago  . FLEXIBLE BRONCHOSCOPY N/A 11/05/2016   Procedure: FLEXIBLE BRONCHOSCOPY;  Surgeon: Wilhelmina Mcardle, MD;  Location: [facility] ORS;  Service: Pulmonary;  Laterality: N/A;  . PORTA CATH INSERTION N/A 11/23/2016   Procedure: Glori Luis Cath Insertion;  Surgeon: Algernon Huxley, MD;   Location: [place] [facility];  Service: Cardiovascular;  Laterality: N/A;  . TONSILLECTOMY      SOCIAL HISTORY: Social History   Socioeconomic History  . Marital status: Married    Spouse name: Not on file  . Number of children: Not on file  . Years of education: Not on file  . Highest education level: Not on file  Social Needs  . Financial resource strain: Not on file  . Food insecurity - worry: Not on file  . Food insecurity - inability: Not on file  . Transportation needs - medical: Not on file  . Transportation needs - non-medical: Not on file  Occupational History  . Not on file  Tobacco Use  . Smoking status: Former Smoker    Packs/day: 1.00    Years: 50.00    Pack years: 50.00    Types: Cigarettes    Last attempt to quit: 04/07/2004    Years since quitting: 12.8  . Smokeless tobacco: Never Used  Substance and Sexual Activity  . Alcohol use: No    Comment: history of alcohol use  . Drug use: No  . Sexual activity: Yes  Other Topics Concern  . Not on file  Social History Narrative  . Not on file    FAMILY HISTORY: Family History  Problem Relation Age of Onset  . Skin cancer Mother   . Ovarian cancer Sister        sister was diagnosed as a  teenager  . Throat cancer Brother 49       brother #1  . Lung cancer Sister 73  . Throat cancer Brother 5       brother #2  . Skin cancer Sister     ALLERGIES:  is allergic to penicillin g; shellfish-derived products; statins; erythromycin; tamsulosin; tuberculin ppd; and iodinated diagnostic agents.  MEDICATIONS:  Current Outpatient Medications  Medication Sig Dispense Refill  . APPLE CIDER VINEGAR PO Take 450 mg by mouth at bedtime.     Marland Kitchen aspirin EC 81 MG tablet Take 81 mg by mouth at bedtime.     . cetirizine (ZYRTEC) 10 MG tablet Take 10 mg by mouth daily.    . cholecalciferol (VITAMIN D) 1000 units tablet Take 1,000 Units by mouth daily.    . Coenzyme Q10 100 MG capsule Take 100 mg by mouth daily.     .  Flaxseed, Linseed, (FLAXSEED OIL) 1000 MG CAPS Take 1,000 mg by mouth at bedtime.     . fluticasone (FLONASE) 50 MCG/ACT nasal spray Place 1 spray into both nostrils daily as needed for allergies or rhinitis.    Marland Kitchen glipiZIDE (GLUCOTROL) 10 MG tablet Take 5 mg by mouth daily. Time release capsule    . GNP GARLIC EXTRACT PO Take 4,268 mg by mouth daily.     . hydrochlorothiazide (HYDRODIURIL) 25 MG tablet Take 25 mg by mouth daily.    Marland Kitchen lidocaine-prilocaine (EMLA) cream Apply 1 application topically as needed. 30 g 3  . linagliptin (TRADJENTA) 5 MG TABS tablet Take 5 mg by mouth daily.    Marland Kitchen lisinopril (PRINIVIL,ZESTRIL) 10 MG tablet Take 10 mg by mouth daily.    . Multiple Vitamin (MULTIVITAMIN WITH MINERALS) TABS tablet Take 1 tablet by mouth daily.    Marland Kitchen omeprazole (PRILOSEC) 20 MG capsule Take 20 mg by mouth every Monday, Wednesday, and Friday.     Marland Kitchen ZINC-VITAMIN C MT Take 1 tablet daily by mouth.    Marland Kitchen glucagon (GLUCAGON EMERGENCY) 1 MG injection Inject 1 mg into the muscle once as needed. When blood glucose drops less than 50; and if patient has difficulty drinking. (Patient not taking: Reported on 01/20/2017) 1 each 12  . ondansetron (ZOFRAN) 8 MG tablet Take 1 tablet (8 mg total) by mouth every 8 (eight) hours as needed for nausea or vomiting (start 3 days; after chemo). (Patient not taking: Reported on 01/20/2017) 40 tablet 1  . prochlorperazine (COMPAZINE) 10 MG tablet Take 1 tablet (10 mg total) by mouth every 6 (six) hours as needed for nausea or vomiting. (Patient not taking: Reported on 01/20/2017) 40 tablet 1  . sucralfate (CARAFATE) 1 g tablet Take 1 tablet (1 g total) by mouth 3 (three) times daily before meals. (Patient not taking: Reported on 01/20/2017) 90 tablet 6   No current facility-administered medications for this visit.       Marland Kitchen  PHYSICAL EXAMINATION: ECOG PERFORMANCE STATUS: 1 - Symptomatic but completely ambulatory  Vitals:   02/10/17 0935  BP: (!) 152/81  Pulse: 67   Resp: 20  Temp: 97.8 F (36.6 C)   Filed Weights   02/10/17 0935  Weight: 207 lb (93.9 kg)    GENERAL: Well-nourished well-developed; Alert, no distress and comfortable.   With his wife.  EYES: no pallor or icterus OROPHARYNX: no thrush or ulceration; dentures.   NECK: supple, no masses felt LYMPH:  no palpable lymphadenopathy in the cervical, axillary or inguinal regions LUNGS: clear to auscultation and  No  wheeze or crackles HEART/CVS: regular rate & rhythm and no murmurs; No lower extremity edema ABDOMEN: abdomen soft, non-tender and normal bowel sounds Musculoskeletal:no cyanosis of digits and no clubbing  PSYCH: alert & oriented x 3 with fluent speech NEURO: no focal motor/sensory deficits SKIN:  no rashes or significant lesions  LABORATORY DATA:  I have reviewed the data as listed Lab Results  Component Value Date   WBC 5.6 02/10/2017   HGB 11.3 (L) 02/10/2017   HCT 34.0 (L) 02/10/2017   MCV 91.7 02/10/2017   PLT 245 02/10/2017   Recent Labs    01/20/17 0951 02/01/17 1150 02/10/17 0847  NA 134* 136 135  K 4.8 4.7 4.8  CL 102 104 105  CO2 _0 GLUCOSE 214* 109* 172*  BUN 31* 25* 33*  CREATININE 1.96* 1.89* 2.01*  CALCIUM 9.4 9.2 8.9  GFRNONAA 31* 33* 30*  GFRAA 36* 38* 35*  PROT 7.4 7.5 7.6  ALBUMIN 3.7 3.7 3.6  AST _1 ALT 10* 9* 13*  ALKPHOS 65 95 88  BILITOT 0.4 0.3 0.3    RADIOGRAPHIC STUDIES: I have personally reviewed the radiological images as listed and agreed with the findings in the report. No results found. IMPRESSION: 1. 6.5 cm hypermetabolic left upper lobe the lung mass partially abutting the mediastinum and left anterior hilum. Probable involvement of the left lower paratracheal lymph node. Questionable involvement of the right lower paratracheal lymph node. Assuming involvement of the left but not the right paratracheal lymph nodes, and assuming non-small cell lung cancer, appearance is compatible with T2b N2 M0  disease (stage IIIa). 2.  Aortic Atherosclerosis (ICD10-I70.0). 3. Urinary bladder wall thickening may be due to nondistention, cystitis not entirely excluded.   Electronically Signed   By: Van Clines M.D.   On: 11/06/2016 12:48  ASSESSMENT & PLAN:   Cancer of upper lobe of left lung ([place]) # SMALL CELL LUNG CA  LIMITED STAGE- left upper lobe- on concurrent carbo [09/19]-Etop with RT [started 27th sep]. patient status post cycle # 3 appx 3 weeks ago.  # proceed with chemo # 4 today; CBC CMP are reviewed; adequate today; no contraindications to chemotherapy. Proceed with treatment today.  Discussed with the patient and wife that hopefully this will be his last cycle of chemotherapy.  We will plan to get a CT scan in approximately 6 weeks.  # Anemia- CKD/chemo-stable -10-11 on PO iron.  Stable.  # Diabetes- brittle; recommend close monitoring; improved; BG-214today;improved off metforminl on trajenda per PCP.  #Kidney creatinine-2.01; stable around baseline.  Monitor for now.  # labs in 1 week; follow up in 6 weeks/MD/labs- port flush; CT scan few days.  prior; port flush.   All questions were answered. The patient knows to call the clinic with any problems, questions or concerns.    Cammie Sickle, MD 02/10/2017 2:00 PM

## 2017-02-11 ENCOUNTER — Inpatient Hospital Stay: Payer: Medicare Other

## 2017-02-11 DIAGNOSIS — C3412 Malignant neoplasm of upper lobe, left bronchus or lung: Secondary | ICD-10-CM

## 2017-02-11 DIAGNOSIS — Z5111 Encounter for antineoplastic chemotherapy: Secondary | ICD-10-CM | POA: Diagnosis not present

## 2017-02-11 MED ORDER — HEPARIN SOD (PORK) LOCK FLUSH 100 UNIT/ML IV SOLN
500.0000 [IU] | Freq: Once | INTRAVENOUS | Status: AC | PRN
  Administered 2017-02-11: 500 [IU]

## 2017-02-11 MED ORDER — SODIUM CHLORIDE 0.9% FLUSH
10.0000 mL | INTRAVENOUS | Status: DC | PRN
  Administered 2017-02-11: 10 mL
  Filled 2017-02-11: qty 10

## 2017-02-11 MED ORDER — SODIUM CHLORIDE 0.9 % IV SOLN
75.0000 mg/m2 | Freq: Once | INTRAVENOUS | Status: AC
  Administered 2017-02-11: 160 mg via INTRAVENOUS
  Filled 2017-02-11: qty 8

## 2017-02-11 MED ORDER — DEXAMETHASONE SODIUM PHOSPHATE 10 MG/ML IJ SOLN
4.0000 mg | Freq: Once | INTRAMUSCULAR | Status: AC
  Administered 2017-02-11: 4 mg via INTRAVENOUS
  Filled 2017-02-11: qty 1

## 2017-02-11 MED ORDER — SODIUM CHLORIDE 0.9 % IV SOLN: 4.0000 mg | Freq: Once | INTRAVENOUS | Status: DC

## 2017-02-11 MED ORDER — SODIUM CHLORIDE 0.9 % IV SOLN
Freq: Once | INTRAVENOUS | Status: AC
  Administered 2017-02-11: 14:00:00 via INTRAVENOUS
  Filled 2017-02-11: qty 1000

## 2017-02-11 NOTE — Progress Notes (Signed)
Nutrition Follow-up:  Patient with lung cancer receiving chemotherapy, last dose tomorrow. Has completed radiation.  Met with patient during infusion this pm.  Patient eating peanut butter crackers during visit.  Reports appetite is "too good".  Reports that he is eating well (good sources of protein, vegetables, limiting starchy foods but loves rice due to blood glucose).  Noted was taken off metformin and started on tradjenta due to renal insufficiency.    No nutrition impact symptoms reported.  Feels that he is doing well  Medications: reviewed  Labs: reviewed  Anthropometrics:   Weight increased to 207 lb on 12/5 from 198 lb on 10/10 last nutrition follow-up.  Patient wants to stay around 205 lb.   IBW 199 lb for ht.   NUTRITION DIAGNOSIS: Inadequate oral intake improved   MALNUTRITION DIAGNOSIS: continue to monitor   INTERVENTION:   Encouraged patient to continue to eat well-balanced diet. Discussed foods that effect blood glucose. Encouraged patient to continue with good nutrition following treatment ending.      MONITORING, EVALUATION, GOAL: weight trends, intake  NEXT VISIT: as needed  Tanvi Gatling B. Zenia Resides, [place], [place] Registered Dietitian [phone] (pager)

## 2017-02-12 ENCOUNTER — Inpatient Hospital Stay: Payer: Medicare Other

## 2017-02-12 VITALS — BP 163/92 | HR 77 | Temp 97.4°F | Resp 20 | Wt 207.0 lb

## 2017-02-12 DIAGNOSIS — C3412 Malignant neoplasm of upper lobe, left bronchus or lung: Secondary | ICD-10-CM

## 2017-02-12 DIAGNOSIS — Z5111 Encounter for antineoplastic chemotherapy: Secondary | ICD-10-CM | POA: Diagnosis not present

## 2017-02-12 MED ORDER — DEXAMETHASONE SODIUM PHOSPHATE 10 MG/ML IJ SOLN
4.0000 mg | Freq: Once | INTRAMUSCULAR | Status: AC
  Administered 2017-02-12: 4 mg via INTRAVENOUS
  Filled 2017-02-12: qty 1

## 2017-02-12 MED ORDER — SODIUM CHLORIDE 0.9 % IV SOLN: 4.0000 mg | Freq: Once | INTRAVENOUS | Status: DC

## 2017-02-12 MED ORDER — HEPARIN SOD (PORK) LOCK FLUSH 100 UNIT/ML IV SOLN
500.0000 [IU] | Freq: Once | INTRAVENOUS | Status: AC | PRN
  Administered 2017-02-12: 500 [IU]
  Filled 2017-02-12: qty 5

## 2017-02-12 MED ORDER — SODIUM CHLORIDE 0.9 % IV SOLN
Freq: Once | INTRAVENOUS | Status: AC
  Administered 2017-02-12: 14:00:00 via INTRAVENOUS
  Filled 2017-02-12: qty 1000

## 2017-02-12 MED ORDER — PEGFILGRASTIM 6 MG/0.6ML ~~LOC~~ PSKT
6.0000 mg | PREFILLED_SYRINGE | Freq: Once | SUBCUTANEOUS | Status: AC
  Administered 2017-02-12: 6 mg via SUBCUTANEOUS
  Filled 2017-02-12: qty 0.6

## 2017-02-12 MED ORDER — SODIUM CHLORIDE 0.9% FLUSH
10.0000 mL | INTRAVENOUS | Status: DC | PRN
  Administered 2017-02-12: 10 mL
  Filled 2017-02-12: qty 10

## 2017-02-12 MED ORDER — ETOPOSIDE CHEMO INJECTION 1 GM/50ML
75.0000 mg/m2 | Freq: Once | INTRAVENOUS | Status: AC
  Administered 2017-02-12: 160 mg via INTRAVENOUS
  Filled 2017-02-12: qty 8

## 2017-02-17 ENCOUNTER — Other Ambulatory Visit: Payer: Self-pay

## 2017-02-17 ENCOUNTER — Inpatient Hospital Stay: Payer: Medicare Other

## 2017-02-17 ENCOUNTER — Ambulatory Visit
Admission: RE | Admit: 2017-02-17 | Discharge: 2017-02-17 | Disposition: A | Discharge status: Home / Self Care | Payer: Medicare Other | Source: Ambulatory Visit | Attending: Radiation Oncology | Admitting: Radiation Oncology

## 2017-02-17 ENCOUNTER — Encounter: Payer: Self-pay | Admitting: Radiation Oncology

## 2017-02-17 VITALS — BP 164/92 | HR 83 | Temp 97.1°F | Resp 22 | Wt 209.9 lb

## 2017-02-17 DIAGNOSIS — Z923 Personal history of irradiation: Secondary | ICD-10-CM | POA: Insufficient documentation

## 2017-02-17 DIAGNOSIS — C3412 Malignant neoplasm of upper lobe, left bronchus or lung: Secondary | ICD-10-CM

## 2017-02-17 DIAGNOSIS — Z87891 Personal history of nicotine dependence: Secondary | ICD-10-CM | POA: Insufficient documentation

## 2017-02-17 DIAGNOSIS — Z9221 Personal history of antineoplastic chemotherapy: Secondary | ICD-10-CM | POA: Diagnosis not present

## 2017-02-17 DIAGNOSIS — Z5111 Encounter for antineoplastic chemotherapy: Secondary | ICD-10-CM | POA: Diagnosis not present

## 2017-02-17 LAB — CBC WITH DIFFERENTIAL/PLATELET
BASOS PCT: 0 %
Basophils Absolute: 0.1 10*3/uL (ref 0–0.1)
EOS PCT: 1 %
Eosinophils Absolute: 0.2 10*3/uL (ref 0–0.7)
HEMATOCRIT: 34.2 % — AB (ref 40.0–52.0)
Hemoglobin: 11.1 g/dL — ABNORMAL LOW (ref 13.0–18.0)
Lymphocytes Relative: 2 %
Lymphs Abs: 0.7 10*3/uL — ABNORMAL LOW (ref 1.0–3.6)
MCH: 30.1 pg (ref 26.0–34.0)
MCHC: 32.4 g/dL (ref 32.0–36.0)
MCV: 92.9 fL (ref 80.0–100.0)
MONO ABS: 1.2 10*3/uL — AB (ref 0.2–1.0)
MONOS PCT: 4 %
NEUTROS ABS: 28.3 10*3/uL — AB (ref 1.4–6.5)
Neutrophils Relative %: 93 %
PLATELETS: 264 10*3/uL (ref 150–440)
RBC: 3.68 MIL/uL — ABNORMAL LOW (ref 4.40–5.90)
RDW: 19.4 % — AB (ref 11.5–14.5)
WBC: 30.4 10*3/uL — ABNORMAL HIGH (ref 3.8–10.6)

## 2017-02-17 LAB — BASIC METABOLIC PANEL
ANION GAP: 5 (ref 5–15)
BUN: 27 mg/dL — AB (ref 6–20)
CALCIUM: 8.9 mg/dL (ref 8.9–10.3)
CO2: 24 mmol/L (ref 22–32)
CREATININE: 1.89 mg/dL — AB (ref 0.61–1.24)
Chloride: 108 mmol/L (ref 101–111)
GFR calc Af Amer: 38 mL/min — ABNORMAL LOW (ref >60)
GFR, EST NON AFRICAN AMERICAN: 33 mL/min — AB (ref >60)
GLUCOSE: 188 mg/dL — AB (ref 65–99)
Potassium: 5.2 mmol/L — ABNORMAL HIGH (ref 3.5–5.1)
Sodium: 137 mmol/L (ref 135–145)

## 2017-02-17 NOTE — Progress Notes (Signed)
Radiation Oncology Follow up Note  Name: Edwin Jones   Date:   02/17/2017 MRN:  [id number] DOB: Sep 07, 1940    This 76 y.o. male presents to the clinic today for one-month follow-up status post concurrent chemoradiation for stage IIIa (T2 b N2 M0). Limited stage small cell lung cancer  REFERRING PROVIDER: Marguerita Merles, MD  HPI: Patient is a 76 year old male now one month out having completed concurrent chemoradiation for limited stage small cell lung cancer stage IIIa. Seen today in routine follow-up he is doing well. He specifically denies cough hemoptysis or chest night tightness.Marland Kitchen He is doing well and entering his final round of chemotherapy. He is scheduled for a CT scan of the chest in about 6 weeks.  COMPLICATIONS OF TREATMENT: none  FOLLOW UP COMPLIANCE: keeps appointments   PHYSICAL EXAM:  BP (!) 164/92   Pulse 83   Temp (!) 97.1 F (36.2 C)   Resp (!) 22   Wt 209 lb 14.1 oz (95.2 kg)   BMI 26.23 kg/m  Well-developed well-nourished patient in NAD. HEENT reveals PERLA, EOMI, discs not visualized.  Oral cavity is clear. No oral mucosal lesions are identified. Neck is clear without evidence of cervical or supraclavicular adenopathy. Lungs are clear to A&P. Cardiac examination is essentially unremarkable with regular rate and rhythm without murmur rub or thrill. Abdomen is benign with no organomegaly or masses noted. Motor sensory and DTR levels are equal and symmetric in the upper and lower extremities. Cranial nerves II through XII are grossly intact. Proprioception is intact. No peripheral adenopathy or edema is identified. No motor or sensory levels are noted. Crude visual fields are within normal range.  RADIOLOGY RESULTS: I have requested films of his latest CT scan when available.  PLAN: Present time patient is doing well. I'm please was overall progress. Will review his CT scan when it becomes available and if it shows complete response would offer whole brain radiation  therapy to eradicate microscopic residual disease. Patient continues close follow-up care with medical oncology. I have asked to see him back in about 2 months for evaluation of whole brain radiation.  I would like to take this opportunity to thank you for allowing me to participate in the care of your patient.Noreene Filbert, MD

## 2017-03-18 ENCOUNTER — Ambulatory Visit: Admission: RE | Admit: 2017-03-18 | Payer: Medicare Other | Source: Ambulatory Visit | Admitting: *Deleted

## 2017-03-18 ENCOUNTER — Telehealth: Payer: Self-pay | Admitting: *Deleted

## 2017-03-18 ENCOUNTER — Ambulatory Visit
Admission: RE | Admit: 2017-03-18 | Discharge: 2017-03-18 | Disposition: A | Discharge status: Home / Self Care | Payer: Medicare Other | Source: Ambulatory Visit | Attending: Nurse Practitioner | Admitting: Nurse Practitioner

## 2017-03-18 ENCOUNTER — Inpatient Hospital Stay: Payer: Medicare Other | Attending: Internal Medicine

## 2017-03-18 DIAGNOSIS — B1920 Unspecified viral hepatitis C without hepatic coma: Secondary | ICD-10-CM | POA: Insufficient documentation

## 2017-03-18 DIAGNOSIS — R0789 Other chest pain: Secondary | ICD-10-CM | POA: Diagnosis not present

## 2017-03-18 DIAGNOSIS — D631 Anemia in chronic kidney disease: Secondary | ICD-10-CM | POA: Insufficient documentation

## 2017-03-18 DIAGNOSIS — Z923 Personal history of irradiation: Secondary | ICD-10-CM | POA: Insufficient documentation

## 2017-03-18 DIAGNOSIS — N183 Chronic kidney disease, stage 3 (moderate): Secondary | ICD-10-CM | POA: Insufficient documentation

## 2017-03-18 DIAGNOSIS — Z5189 Encounter for other specified aftercare: Secondary | ICD-10-CM | POA: Insufficient documentation

## 2017-03-18 DIAGNOSIS — C3412 Malignant neoplasm of upper lobe, left bronchus or lung: Secondary | ICD-10-CM | POA: Diagnosis not present

## 2017-03-18 LAB — COMPREHENSIVE METABOLIC PANEL
ALK PHOS: 87 U/L (ref 38–126)
ALT: 10 U/L — AB (ref 17–63)
AST: 20 U/L (ref 15–41)
Albumin: 4.1 g/dL (ref 3.5–5.0)
Anion gap: 8 (ref 5–15)
BILIRUBIN TOTAL: 0.5 mg/dL (ref 0.3–1.2)
BUN: 27 mg/dL — AB (ref 6–20)
CO2: 23 mmol/L (ref 22–32)
CREATININE: 1.98 mg/dL — AB (ref 0.61–1.24)
Calcium: 9.4 mg/dL (ref 8.9–10.3)
Chloride: 106 mmol/L (ref 101–111)
GFR calc Af Amer: 36 mL/min — ABNORMAL LOW (ref >60)
GFR, EST NON AFRICAN AMERICAN: 31 mL/min — AB (ref >60)
Glucose, Bld: 123 mg/dL — ABNORMAL HIGH (ref 65–99)
Potassium: 4.9 mmol/L (ref 3.5–5.1)
Sodium: 137 mmol/L (ref 135–145)
TOTAL PROTEIN: 8.1 g/dL (ref 6.5–8.1)

## 2017-03-18 LAB — CBC WITH DIFFERENTIAL/PLATELET
BASOS ABS: 0 10*3/uL (ref 0–0.1)
BASOS PCT: 0 %
Eosinophils Absolute: 0.5 10*3/uL (ref 0–0.7)
Eosinophils Relative: 8 %
HEMATOCRIT: 40 % (ref 40.0–52.0)
HEMOGLOBIN: 13 g/dL (ref 13.0–18.0)
LYMPHS PCT: 18 %
Lymphs Abs: 1 10*3/uL (ref 1.0–3.6)
MCH: 31 pg (ref 26.0–34.0)
MCHC: 32.7 g/dL (ref 32.0–36.0)
MCV: 95 fL (ref 80.0–100.0)
MONO ABS: 1.1 10*3/uL — AB (ref 0.2–1.0)
Monocytes Relative: 20 %
NEUTROS ABS: 3 10*3/uL (ref 1.4–6.5)
NEUTROS PCT: 54 %
Platelets: 255 10*3/uL (ref 150–440)
RBC: 4.21 MIL/uL — AB (ref 4.40–5.90)
RDW: 17.4 % — ABNORMAL HIGH (ref 11.5–14.5)
WBC: 5.7 10*3/uL (ref 3.8–10.6)

## 2017-03-18 LAB — TROPONIN I: Troponin I: <0.03 ng/mL (ref <0.03)

## 2017-03-18 LAB — MAGNESIUM: MAGNESIUM: 2 mg/dL (ref 1.7–2.4)

## 2017-03-18 NOTE — Telephone Encounter (Signed)
Per Lauren have patient come in today for [facility], CMP, Mg+, Troponin, CXR 2 view, See NP tomorrow. I spoke with wife and they will be here in 30 mins for lab then go to hospital for CXR and come in tomorrow to see NP at 930

## 2017-03-18 NOTE — Telephone Encounter (Signed)
Patient complains of pain left chest for several days. States it started out bilaterally and has now settled on left side of chest. Pain does not radiated anywhere. He does get sharp shooting pain "every so often" Reports that he feels he has had a lot of gas the past few weeks. He has not taken anything for this. His last chemotherapy was 5 - 6 weeks ago. Please advise

## 2017-03-19 ENCOUNTER — Other Ambulatory Visit: Payer: Self-pay

## 2017-03-19 ENCOUNTER — Encounter: Payer: Self-pay | Admitting: Nurse Practitioner

## 2017-03-19 ENCOUNTER — Inpatient Hospital Stay (HOSPITAL_BASED_OUTPATIENT_CLINIC_OR_DEPARTMENT_OTHER): Payer: Medicare Other | Admitting: Nurse Practitioner

## 2017-03-19 VITALS — BP 148/79 | HR 76 | Temp 97.8°F | Resp 20

## 2017-03-19 DIAGNOSIS — Z923 Personal history of irradiation: Secondary | ICD-10-CM

## 2017-03-19 DIAGNOSIS — D631 Anemia in chronic kidney disease: Secondary | ICD-10-CM

## 2017-03-19 DIAGNOSIS — B1920 Unspecified viral hepatitis C without hepatic coma: Secondary | ICD-10-CM | POA: Diagnosis not present

## 2017-03-19 DIAGNOSIS — C3412 Malignant neoplasm of upper lobe, left bronchus or lung: Secondary | ICD-10-CM

## 2017-03-19 DIAGNOSIS — N183 Chronic kidney disease, stage 3 (moderate): Secondary | ICD-10-CM | POA: Diagnosis not present

## 2017-03-19 DIAGNOSIS — M62838 Other muscle spasm: Secondary | ICD-10-CM

## 2017-03-19 MED ORDER — METHOCARBAMOL 500 MG PO TABS: 500.0000 mg | ORAL_TABLET | Freq: Three times a day (TID) | ORAL | 0 refills | Status: DC | PRN

## 2017-03-19 NOTE — Progress Notes (Signed)
Symptom Management Consult note [facility]  Telephone:([phone] [phone]  Patient Care Team: Marguerita Merles, MD as PCP - General (Family Medicine) Telford Nab, RN as Registered Nurse   Name of the patient: Edwin Jones  [id number]  1940-07-08   Date of visit: 03/19/17  Diagnosis-   Chief complaint/ Reason for visit- pain in his sides  Heme/Onc history: Patient last evaluated by primary oncologist, Dr. Rogue Bussing, on 02/10/17 and rad-onc, Dr. Baruch Gouty, on 02/17/17.  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  PET 2018-no distant metastases.  11/25/16-carbo etoposide + RT (finished RT-01/13/17).  9/18-MRI brain-negative.  Completed 4 cycles of chemotherapy with Neulasta support.  Last chemo 02/12/17. Hep C-immunization and [employer]; s/p Harvoni at [facility] 2017. CKD stage III  Interval history-patient presents to symptom management clinic today for complaints of pain in his sides that started approximately 1 week ago and has gradually improved.  He localizes pain to left side lower chest-upper abdomen with similar pain on right side.  He describes the pain as soreness with occasional sharp episodes, worsened by movement.  He has noticed some increase in flatulence and reflux which are improved with simethicone and omeprazole.  ECOG FS:1 - Symptomatic but completely ambulatory  Review of systems- Review of Systems  Constitutional: Negative.   HENT: Negative.   Eyes: Negative.   Respiratory: Negative.   Cardiovascular: Negative.   Gastrointestinal: Negative.   Genitourinary: Negative.   Musculoskeletal: Negative.   Skin: Negative.   Neurological: Negative.   Endo/Heme/Allergies: Negative.   Psychiatric/Behavioral: Negative.      Current treatment- s/p 4 cycles carbo-etopo (02/12/17) + RT ( w/ Neulasta.   Allergies  Allergen Reactions  . Penicillin G Anaphylaxis    Other  reaction(s): Other (See Comments) Couldn't breath among other things Has patient had a PCN reaction causing immediate rash, facial/tongue/throat swelling, SOB or lightheadedness with hypotension: Yes Has patient had a PCN reaction causing severe rash involving mucus membranes or skin necrosis: No Has patient had a PCN reaction that required hospitalization: Yes Has patient had a PCN reaction occurring within the last 10 years: No If all of the above answers are "NO", then may proceed with Ce  . Shellfish-Derived Products Anaphylaxis  . Statins     Other reaction(s): Other (See Comments) Muscle spasms - can't walk - couldn't turn over in bed  . Erythromycin Itching    All mycins  . Tamsulosin     Other reaction(s): Dizziness  . Tuberculin Ppd     Other reaction(s): Other (See Comments) False test   . Iodinated Diagnostic Agents Hives     Past Medical History:  Diagnosis Date  . COPD (chronic obstructive pulmonary disease) ([place])   . Diabetes mellitus without complication ([place])   . GERD without esophagitis   . Hepatitis C virus    history of hep C treated with Harvoni  . Hypercholesterolemia   . Hypertension   . Seasonal allergies      Past Surgical History:  Procedure Laterality Date  . COLONOSCOPY     approximately 11 years ago  . FLEXIBLE BRONCHOSCOPY N/A 11/05/2016   Procedure: FLEXIBLE BRONCHOSCOPY;  Surgeon: Wilhelmina Mcardle, MD;  Location: [facility] ORS;  Service: Pulmonary;  Laterality: N/A;  . PORTA CATH INSERTION N/A 11/23/2016   Procedure: Glori Luis Cath Insertion;  Surgeon: Algernon Huxley, MD;  Location: [place] [facility];  Service: Cardiovascular;  Laterality:  N/A;  . TONSILLECTOMY      Social History   Socioeconomic History  . Marital status: Married    Spouse name: Not on file  . Number of children: Not on file  . Years of education: Not on file  . Highest education level: Not on file  Social Needs  . Financial resource strain: Not on file  . Food insecurity  - worry: Not on file  . Food insecurity - inability: Not on file  . Transportation needs - medical: Not on file  . Transportation needs - non-medical: Not on file  Occupational History  . Not on file  Tobacco Use  . Smoking status: Former Smoker    Packs/day: 1.00    Years: 50.00    Pack years: 50.00    Types: Cigarettes    Last attempt to quit: 04/07/2004    Years since quitting: 12.9  . Smokeless tobacco: Never Used  Substance and Sexual Activity  . Alcohol use: No    Comment: history of alcohol use  . Drug use: No  . Sexual activity: Yes  Other Topics Concern  . Not on file  Social History Narrative  . Not on file    Family History  Problem Relation Age of Onset  . Skin cancer Mother   . Ovarian cancer Sister        sister was diagnosed as a teenager  . Throat cancer Brother 53       brother #1  . Lung cancer Sister 39  . Throat cancer Brother 1       brother #2  . Skin cancer Sister      Current Outpatient Medications:  .  APPLE CIDER VINEGAR PO, Take 450 mg by mouth at bedtime. , Disp: , Rfl:  .  aspirin EC 81 MG tablet, Take 81 mg by mouth at bedtime. , Disp: , Rfl:  .  cetirizine (ZYRTEC) 10 MG tablet, Take 10 mg by mouth daily., Disp: , Rfl:  .  cholecalciferol (VITAMIN D) 1000 units tablet, Take 1,000 Units by mouth daily., Disp: , Rfl:  .  Flaxseed, Linseed, (FLAXSEED OIL) 1000 MG CAPS, Take 1,000 mg by mouth at bedtime. , Disp: , Rfl:  .  fluticasone (FLONASE) 50 MCG/ACT nasal spray, Place 1 spray into both nostrils daily as needed for allergies or rhinitis., Disp: , Rfl:  .  glipiZIDE (GLUCOTROL) 10 MG tablet, Take 10 mg by mouth daily. Time release capsule, Disp: , Rfl:  .  GNP GARLIC EXTRACT PO, Take 1,000 mg by mouth daily. , Disp: , Rfl:  .  hydrochlorothiazide (HYDRODIURIL) 25 MG tablet, Take 25 mg by mouth daily., Disp: , Rfl:  .  lidocaine-prilocaine (EMLA) cream, Apply 1 application topically as needed., Disp: 30 g, Rfl: 3 .  linagliptin  (TRADJENTA) 5 MG TABS tablet, Take 5 mg by mouth daily., Disp: , Rfl:  .  lisinopril (PRINIVIL,ZESTRIL) 10 MG tablet, Take 10 mg by mouth daily., Disp: , Rfl:  .  Multiple Vitamin (MULTIVITAMIN WITH MINERALS) TABS tablet, Take 1 tablet by mouth daily., Disp: , Rfl:  .  omeprazole (PRILOSEC) 20 MG capsule, Take 20 mg by mouth every Monday, Wednesday, and Friday. , Disp: , Rfl:  .  prochlorperazine (COMPAZINE) 10 MG tablet, Take 1 tablet (10 mg total) by mouth every 6 (six) hours as needed for nausea or vomiting., Disp: 40 tablet, Rfl: 1 .  simethicone (MYLICON) 80 MG chewable tablet, Chew 160 mg by mouth once., Disp: , Rfl:  .  ZINC-VITAMIN C MT, Take 1 tablet daily by mouth., Disp: , Rfl:  .  Coenzyme Q10 100 MG capsule, Take 100 mg by mouth daily. , Disp: , Rfl:  .  glucagon (GLUCAGON EMERGENCY) 1 MG injection, Inject 1 mg into the muscle once as needed. When blood glucose drops less than 50; and if patient has difficulty drinking. (Patient not taking: Reported on 01/20/2017), Disp: 1 each, Rfl: 12 .  methocarbamol (ROBAXIN) 500 MG tablet, Take 1 tablet (500 mg total) by mouth every 8 (eight) hours as needed for muscle spasms., Disp: 60 tablet, Rfl: 0 .  ondansetron (ZOFRAN) 8 MG tablet, Take 1 tablet (8 mg total) by mouth every 8 (eight) hours as needed for nausea or vomiting (start 3 days; after chemo). (Patient not taking: Reported on 01/20/2017), Disp: 40 tablet, Rfl: 1  Physical exam:  Vitals:   03/19/17 0930  BP: (!) 148/79  Pulse: 76  Resp: 20  Temp: 97.8 F (36.6 C)  TempSrc: Tympanic   Physical Exam  Constitutional: He is oriented to person, place, and time and well-developed, well-nourished, and in no distress.  HENT:  Head: Normocephalic and atraumatic.  Eyes: Pupils are equal, round, and reactive to light.  Neck: Normal range of motion. Neck supple.  Cardiovascular: Normal rate and regular rhythm.  Pulmonary/Chest: Effort normal and breath sounds normal.  Abdominal: Soft.  Bowel sounds are normal. He exhibits no distension. There is no tenderness. There is no rebound and no guarding.  Musculoskeletal: Normal range of motion. He exhibits no edema or deformity.  Complains of mild pain upon sitting up or twisting.  No tenderness to palpation  Lymphadenopathy:    He has no cervical adenopathy.  Neurological: He is alert and oriented to person, place, and time. Coordination normal.  Skin: Skin is warm and dry. No erythema. No pallor.  Psychiatric: Affect normal.     CMP Latest Ref Rng & Units 03/18/2017  Glucose 65 - 99 mg/dL 123(H)  BUN 6 - 20 mg/dL 27(H)  Creatinine 0.61 - 1.24 mg/dL 1.98(H)  Sodium 135 - 145 mmol/L 137  Potassium 3.5 - 5.1 mmol/L 4.9  Chloride 101 - 111 mmol/L 106  CO2 22 - 32 mmol/L 23  Calcium 8.9 - 10.3 mg/dL 9.4  Total Protein 6.5 - 8.1 g/dL 8.1  Total Bilirubin 0.3 - 1.2 mg/dL 0.5  Alkaline Phos 38 - 126 U/L 87  AST 15 - 41 U/L 20  ALT 17 - 63 U/L 10(L)   CBC Latest Ref Rng & Units 03/18/2017  WBC 3.8 - 10.6 K/uL 5.7  Hemoglobin 13.0 - 18.0 g/dL 13.0  Hematocrit 40.0 - 52.0 % 40.0  Platelets 150 - 440 K/uL 255    Dg Chest 2 View  Result Date: 03/19/2017 CLINICAL DATA:  Left-sided chest pain. EXAM: CHEST  2 VIEW COMPARISON:  10/26/2016 FINDINGS: Marked interval decrease in size of the known central left upper lobe mass. The lungs are clear without focal pneumonia, edema, pneumothorax or pleural effusion. The cardiopericardial silhouette is within normal limits for size. Right Port-A-Cath tip overlies the mid SVC. The visualized bony structures of the thorax are intact. IMPRESSION: Interval marked decrease in central left upper lobe mass. No acute findings. Electronically Signed   By: Misty Stanley M.D.   On: 03/19/2017 08:31     Assessment and plan- Patient is a 77 y.o. male with history of small cell lung cancer who presents to symptom management complains of pain in his sides X1 week, gradually improving.  small cell lung  cancer-stage-left upper lobe-now completed 4 cycles of carbo-etoposide with radiation (completed).  Plan for CT scan on 03/24/17 for evaluation.  X-ray shows marked decrease in size of left upper lobe mass.   Lateral chest/abdominal pain-pain gradually improving.  No acute findings on chest x-ray.  Negative troponin.  Labs overall unremarkable. MSK? RT changes?  Nontender to palpation but pain reproducible with movement.  Will do trial of Robaxin and referred to care program.  Follow-up with primary oncologist, Dr. Rogue Bussing, as scheduled after CT scan for discussion of results.  Visit Diagnosis 1. Cancer of upper lobe of left lung ([place])   2. Muscle spasm     Patient expressed understanding and was in agreement with this plan. He also understands that He can call clinic at any time with any questions, concerns, or complaints. Patient advised to notify the clinic if there is no improvement in symptoms or if symptoms worsen in next 3-4 days.     Beckey Rutter, DNP, AGNP-C [place] at [facility] [phone] (office) 03/19/17 1:53 PM

## 2017-03-19 NOTE — Progress Notes (Signed)
Patient here for symptom mgmt apt. Reports pain "under bilateral rib cage" discomfort worse at night time. Made worse with movement and turning from side to side.  described as "ache, soreness" intermittent "stabbing pains." rates pain at 2/10 at the present time. Denies any shortness of breath/hiccoughs. Patient has regular bowel movements. Reports increase in flatulence/belching.Used simethicone with minimal relief. Patient has increased his diet in dietary fiber and eating beans for protein.  Patient reports nose bleed two weeks ago, but no nose bleeds since that episodes.

## 2017-03-24 ENCOUNTER — Ambulatory Visit
Admission: RE | Admit: 2017-03-24 | Discharge: 2017-03-24 | Disposition: A | Discharge status: Home / Self Care | Payer: Medicare Other | Source: Ambulatory Visit | Attending: Internal Medicine | Admitting: Internal Medicine

## 2017-03-24 ENCOUNTER — Other Ambulatory Visit: Payer: Medicare Other

## 2017-03-24 ENCOUNTER — Ambulatory Visit: Payer: Medicare Other | Admitting: Internal Medicine

## 2017-03-24 DIAGNOSIS — C3412 Malignant neoplasm of upper lobe, left bronchus or lung: Secondary | ICD-10-CM | POA: Insufficient documentation

## 2017-03-24 DIAGNOSIS — I2510 Atherosclerotic heart disease of native coronary artery without angina pectoris: Secondary | ICD-10-CM | POA: Insufficient documentation

## 2017-03-24 DIAGNOSIS — I700 Atherosclerosis of aorta: Secondary | ICD-10-CM | POA: Insufficient documentation

## 2017-03-25 ENCOUNTER — Inpatient Hospital Stay (HOSPITAL_BASED_OUTPATIENT_CLINIC_OR_DEPARTMENT_OTHER): Payer: Medicare Other | Admitting: Internal Medicine

## 2017-03-25 ENCOUNTER — Ambulatory Visit
Admission: RE | Admit: 2017-03-25 | Discharge: 2017-03-25 | Disposition: A | Discharge status: Home / Self Care | Payer: Medicare Other | Source: Ambulatory Visit | Attending: Radiation Oncology | Admitting: Radiation Oncology

## 2017-03-25 ENCOUNTER — Inpatient Hospital Stay: Payer: Medicare Other

## 2017-03-25 VITALS — BP 147/82 | HR 74 | Temp 97.1°F | Resp 16 | Wt 214.2 lb

## 2017-03-25 DIAGNOSIS — R0781 Pleurodynia: Secondary | ICD-10-CM | POA: Insufficient documentation

## 2017-03-25 DIAGNOSIS — C3412 Malignant neoplasm of upper lobe, left bronchus or lung: Secondary | ICD-10-CM

## 2017-03-25 DIAGNOSIS — Z95828 Presence of other vascular implants and grafts: Secondary | ICD-10-CM

## 2017-03-25 DIAGNOSIS — Z510 Encounter for antineoplastic radiation therapy: Secondary | ICD-10-CM | POA: Insufficient documentation

## 2017-03-25 DIAGNOSIS — Z9221 Personal history of antineoplastic chemotherapy: Secondary | ICD-10-CM | POA: Insufficient documentation

## 2017-03-25 DIAGNOSIS — N183 Chronic kidney disease, stage 3 (moderate): Secondary | ICD-10-CM | POA: Diagnosis not present

## 2017-03-25 DIAGNOSIS — Z923 Personal history of irradiation: Secondary | ICD-10-CM

## 2017-03-25 DIAGNOSIS — B1920 Unspecified viral hepatitis C without hepatic coma: Secondary | ICD-10-CM | POA: Diagnosis not present

## 2017-03-25 DIAGNOSIS — Z87891 Personal history of nicotine dependence: Secondary | ICD-10-CM | POA: Insufficient documentation

## 2017-03-25 DIAGNOSIS — D631 Anemia in chronic kidney disease: Secondary | ICD-10-CM

## 2017-03-25 DIAGNOSIS — Z298 Encounter for other specified prophylactic measures: Secondary | ICD-10-CM | POA: Insufficient documentation

## 2017-03-25 DIAGNOSIS — C3411 Malignant neoplasm of upper lobe, right bronchus or lung: Secondary | ICD-10-CM | POA: Insufficient documentation

## 2017-03-25 LAB — COMPREHENSIVE METABOLIC PANEL
ALBUMIN: 3.7 g/dL (ref 3.5–5.0)
ALT: 10 U/L — ABNORMAL LOW (ref 17–63)
ANION GAP: 7 (ref 5–15)
AST: 21 U/L (ref 15–41)
Alkaline Phosphatase: 83 U/L (ref 38–126)
BUN: 26 mg/dL — ABNORMAL HIGH (ref 6–20)
CO2: 22 mmol/L (ref 22–32)
Calcium: 8.8 mg/dL — ABNORMAL LOW (ref 8.9–10.3)
Chloride: 106 mmol/L (ref 101–111)
Creatinine, Ser: 1.94 mg/dL — ABNORMAL HIGH (ref 0.61–1.24)
GFR calc Af Amer: 37 mL/min — ABNORMAL LOW (ref >60)
GFR calc non Af Amer: 32 mL/min — ABNORMAL LOW (ref >60)
GLUCOSE: 226 mg/dL — AB (ref 65–99)
POTASSIUM: 5.3 mmol/L — AB (ref 3.5–5.1)
Sodium: 135 mmol/L (ref 135–145)
TOTAL PROTEIN: 7.2 g/dL (ref 6.5–8.1)
Total Bilirubin: 0.5 mg/dL (ref 0.3–1.2)

## 2017-03-25 LAB — CBC WITH DIFFERENTIAL/PLATELET
BASOS ABS: 0 10*3/uL (ref 0–0.1)
Basophils Relative: 0 %
Eosinophils Absolute: 0.6 10*3/uL (ref 0–0.7)
Eosinophils Relative: 13 %
HEMATOCRIT: 35.3 % — AB (ref 40.0–52.0)
HEMOGLOBIN: 11.7 g/dL — AB (ref 13.0–18.0)
LYMPHS PCT: 18 %
Lymphs Abs: 0.8 10*3/uL — ABNORMAL LOW (ref 1.0–3.6)
MCH: 31.5 pg (ref 26.0–34.0)
MCHC: 33.1 g/dL (ref 32.0–36.0)
MCV: 95.2 fL (ref 80.0–100.0)
MONO ABS: 1 10*3/uL (ref 0.2–1.0)
MONOS PCT: 21 %
NEUTROS ABS: 2.2 10*3/uL (ref 1.4–6.5)
Neutrophils Relative %: 48 %
Platelets: 194 10*3/uL (ref 150–440)
RBC: 3.71 MIL/uL — ABNORMAL LOW (ref 4.40–5.90)
RDW: 15.9 % — AB (ref 11.5–14.5)
WBC: 4.6 10*3/uL (ref 3.8–10.6)

## 2017-03-25 MED ORDER — HEPARIN SOD (PORK) LOCK FLUSH 100 UNIT/ML IV SOLN
500.0000 [IU] | INTRAVENOUS | Status: AC | PRN
  Administered 2017-03-25: 500 [IU]

## 2017-03-25 MED ORDER — ALBUTEROL SULFATE HFA 108 (90 BASE) MCG/ACT IN AERS
2.0000 | INHALATION_SPRAY | Freq: Four times a day (QID) | RESPIRATORY_TRACT | 2 refills | Status: DC | PRN
Start: 2017-03-25 — End: 2017-05-11

## 2017-03-25 MED ORDER — SODIUM CHLORIDE 0.9% FLUSH
10.0000 mL | INTRAVENOUS | Status: AC | PRN
  Administered 2017-03-25: 10 mL
  Filled 2017-03-25: qty 10

## 2017-03-25 MED ORDER — FLUTICASONE-SALMETEROL 500-50 MCG/DOSE IN AEPB: 1.0000 | INHALATION_SPRAY | Freq: Two times a day (BID) | RESPIRATORY_TRACT | 3 refills | Status: DC

## 2017-03-25 NOTE — Assessment & Plan Note (Addendum)
#   SMALL CELL LUNG CA  LIMITED STAGE- left upper lobe- on concurrent carbo [09/19]-Etop with RT-finished December 2018.  #Mar 24 2017-CT scan shows significant improvement of the left upper lobe mass.  No new nodules noted.  Plan reimaging every 3-4 months.  #Primary cranial radiation-discussed the patient the benefits of primary cranial irradiation versus the toxicities.  They are interested.  Appointment with Dr. Donella Stade this afternoon.  # pleuritic pain- right chest wall-clinically not PE.  Continue muscle relaxant if not better PET.   # COPD- advair/albuterol.   # Anemia- CKD/chemo-stable -10-11 on PO iron.  Stable.  # Diabetes- brittle; recommend close monitoring;improved; BG-226; today- improved.  #Kidney creatinine-1.9 stable around baseline.  Monitor for now.  # I reviewed the blood work- with the patient in detail; also reviewed the imaging independently [as summarized above]; and with the patient in detail.   # port flush /6 weeks/ labs;

## 2017-03-25 NOTE — Progress Notes (Signed)
[facility] NOTE  Patient Care Team: Marguerita Merles, MD as PCP - General (Family Medicine) Telford Nab, RN as Registered Nurse  CHIEF COMPLAINTS/PURPOSE OF CONSULTATION: LEFT LUNG MASS     Oncology History   # AUG 2018- LIMITED STAGE SMALL CELL LUNG CA ;[ T4N3M0] LEFT UPPER LUNG MASS- ~5-5.5cm; positive for M-LN. PET 2018- no distant mets.   # SEP 19th 2018- CARBO-ETOP-RT [finished RT- 01/13/2017]; last chemo 02/12/2017; Jan 2018- CT- PR.    # Sep 2018-MRI brain-NEG  # Hep C [? Immunization in [employer]; s/p Harvoni at [place]; 2017]; CKD Stage III     Cancer of upper lobe of left lung ([facility])   HISTORY OF PRESENTING ILLNESS:  Edwin Jones 77 y.o.  male recently diagnosed Limited stage small cell lung cancer- currently on Carbo etoposide along with radiation- Currently status post cycle # 4-is here for follow-up/reviewed the results of his restaging CAT scan.   Patient noted to have right chest wall pain for the last few days.  Pleuritic nature.  However, he denies any worsening shortness of breath.  Denies any fevers.  Denies any chills.  Blood sugars are better controlled; he has been taken off his metformin because of renal insufficiency.  Appetite is good.  No nausea no vomiting.  ROS: A complete 10 point review of system is done which is negative except mentioned above in history of present illness  MEDICAL HISTORY:  Past Medical History:  Diagnosis Date  . COPD (chronic obstructive pulmonary disease) ([place])   . Diabetes mellitus without complication ([place])   . GERD without esophagitis   . Hepatitis C virus    history of hep C treated with Harvoni  . Hypercholesterolemia   . Hypertension   . Seasonal allergies     SURGICAL HISTORY: Past Surgical History:  Procedure Laterality Date  . COLONOSCOPY     approximately 11 years ago  . FLEXIBLE BRONCHOSCOPY N/A 11/05/2016   Procedure: FLEXIBLE BRONCHOSCOPY;  Surgeon: Wilhelmina Mcardle, MD;  Location: [facility]  ORS;  Service: Pulmonary;  Laterality: N/A;  . PORTA CATH INSERTION N/A 11/23/2016   Procedure: Glori Luis Cath Insertion;  Surgeon: Algernon Huxley, MD;  Location: [place] CV LAB;  Service: Cardiovascular;  Laterality: N/A;  . TONSILLECTOMY      SOCIAL HISTORY: Social History   Socioeconomic History  . Marital status: Married    Spouse name: Not on file  . Number of children: Not on file  . Years of education: Not on file  . Highest education level: Not on file  Social Needs  . Financial resource strain: Not on file  . Food insecurity - worry: Not on file  . Food insecurity - inability: Not on file  . Transportation needs - medical: Not on file  . Transportation needs - non-medical: Not on file  Occupational History  . Not on file  Tobacco Use  . Smoking status: Former Smoker    Packs/day: 1.00    Years: 50.00    Pack years: 50.00    Types: Cigarettes    Last attempt to quit: 04/07/2004    Years since quitting: 12.9  . Smokeless tobacco: Never Used  Substance and Sexual Activity  . Alcohol use: No    Comment: history of alcohol use  . Drug use: No  . Sexual activity: Yes  Other Topics Concern  . Not on file  Social History Narrative  . Not on file    FAMILY HISTORY: Family History  Problem Relation Age of Onset  . Skin cancer Mother   . Ovarian cancer Sister        sister was diagnosed as a teenager  . Throat cancer Brother 32       brother #1  . Lung cancer Sister 25  . Throat cancer Brother 74       brother #2  . Skin cancer Sister     ALLERGIES:  is allergic to penicillin g; shellfish-derived products; statins; erythromycin; tamsulosin; tuberculin ppd; and iodinated diagnostic agents.  MEDICATIONS:  Current Outpatient Medications  Medication Sig Dispense Refill  . APPLE CIDER VINEGAR PO Take 450 mg by mouth at bedtime.     Marland Kitchen aspirin EC 81 MG tablet Take 81 mg by mouth at bedtime.     . cetirizine (ZYRTEC) 10 MG tablet Take 10 mg by mouth daily.    .  cholecalciferol (VITAMIN D) 1000 units tablet Take 1,000 Units by mouth daily.    . Coenzyme Q10 100 MG capsule Take 100 mg by mouth daily.     . Flaxseed, Linseed, (FLAXSEED OIL) 1000 MG CAPS Take 1,000 mg by mouth at bedtime.     . fluticasone (FLONASE) 50 MCG/ACT nasal spray Place 1 spray into both nostrils daily as needed for allergies or rhinitis.    Marland Kitchen glipiZIDE (GLUCOTROL) 10 MG tablet Take 10 mg by mouth daily. Time release capsule    . glucagon (GLUCAGON EMERGENCY) 1 MG injection Inject 1 mg into the muscle once as needed. When blood glucose drops less than 50; and if patient has difficulty drinking. 1 each 12  . GNP GARLIC EXTRACT PO Take 3,790 mg by mouth daily.     . hydrochlorothiazide (HYDRODIURIL) 25 MG tablet Take 25 mg by mouth daily.    Marland Kitchen lidocaine-prilocaine (EMLA) cream Apply 1 application topically as needed. 30 g 3  . linagliptin (TRADJENTA) 5 MG TABS tablet Take 5 mg by mouth daily.    Marland Kitchen lisinopril (PRINIVIL,ZESTRIL) 10 MG tablet Take 10 mg by mouth daily.    . methocarbamol (ROBAXIN) 500 MG tablet Take 1 tablet (500 mg total) by mouth every 8 (eight) hours as needed for muscle spasms. 60 tablet 0  . Multiple Vitamin (MULTIVITAMIN WITH MINERALS) TABS tablet Take 1 tablet by mouth daily.    Marland Kitchen omeprazole (PRILOSEC) 20 MG capsule Take 20 mg by mouth every Monday, Wednesday, and Friday.     Marland Kitchen albuterol (PROVENTIL HFA;VENTOLIN HFA) 108 (90 Base) MCG/ACT inhaler Inhale 2 puffs into the lungs every 6 (six) hours as needed for wheezing or shortness of breath. 1 Inhaler 2  . Fluticasone-Salmeterol (ADVAIR DISKUS) 500-50 MCG/DOSE AEPB Inhale 1 puff into the lungs 2 (two) times daily. 1 each 3  . ondansetron (ZOFRAN) 8 MG tablet Take 1 tablet (8 mg total) by mouth every 8 (eight) hours as needed for nausea or vomiting (start 3 days; after chemo). (Patient not taking: Reported on 03/25/2017) 40 tablet 1  . prochlorperazine (COMPAZINE) 10 MG tablet Take 1 tablet (10 mg total) by mouth every  6 (six) hours as needed for nausea or vomiting. (Patient not taking: Reported on 03/25/2017) 40 tablet 1  . simethicone (MYLICON) 80 MG chewable tablet Chew 160 mg by mouth once.    Marland Kitchen ZINC-VITAMIN C MT Take 1 tablet daily by mouth.     No current facility-administered medications for this visit.       Marland Kitchen  PHYSICAL EXAMINATION: ECOG PERFORMANCE STATUS: 1 - Symptomatic but completely ambulatory  Vitals:  03/25/17 1032  BP: (!) 147/82  Pulse: 74  Resp: 16  Temp: (!) 97.1 F (36.2 C)   Filed Weights   03/25/17 1032  Weight: 214 lb 3.2 oz (97.2 kg)    GENERAL: Well-nourished well-developed; Alert, no distress and comfortable.   With his wife.  EYES: no pallor or icterus OROPHARYNX: no thrush or ulceration; dentures.   NECK: supple, no masses felt LYMPH:  no palpable lymphadenopathy in the cervical, axillary or inguinal regions LUNGS: clear to auscultation and  No wheeze or crackles HEART/CVS: regular rate & rhythm and no murmurs; No lower extremity edema ABDOMEN: abdomen soft, non-tender and normal bowel sounds Musculoskeletal:no cyanosis of digits and no clubbing  PSYCH: alert & oriented x 3 with fluent speech NEURO: no focal motor/sensory deficits SKIN:  no rashes or significant lesions  LABORATORY DATA:  I have reviewed the data as listed Lab Results  Component Value Date   WBC 4.6 03/25/2017   HGB 11.7 (L) 03/25/2017   HCT 35.3 (L) 03/25/2017   MCV 95.2 03/25/2017   PLT 194 03/25/2017   Recent Labs    02/10/17 0847 02/17/17 1011 03/18/17 1512 03/25/17 0952  NA 135 137 137 135  K 4.8 5.2* 4.9 5.3*  CL 105 108 106 106  CO2 _0 GLUCOSE 172* 188* 123* 226*  BUN 33* 27* 27* 26*  CREATININE 2.01* 1.89* 1.98* 1.94*  CALCIUM 8.9 8.9 9.4 8.8*  GFRNONAA 30* 33* 31* 32*  GFRAA 35* 38* 36* 37*  PROT 7.6  --  8.1 7.2  ALBUMIN 3.6  --  4.1 3.7  AST 18  --  20 21  ALT 13*  --  10* 10*  ALKPHOS 88  --  87 83  BILITOT 0.3  --  0.5 0.5    RADIOGRAPHIC  STUDIES: I have personally reviewed the radiological images as listed and agreed with the findings in the report. Dg Chest 2 View  Result Date: 03/19/2017 CLINICAL DATA:  Left-sided chest pain. EXAM: CHEST  2 VIEW COMPARISON:  10/26/2016 FINDINGS: Marked interval decrease in size of the known central left upper lobe mass. The lungs are clear without focal pneumonia, edema, pneumothorax or pleural effusion. The cardiopericardial silhouette is within normal limits for size. Right Port-A-Cath tip overlies the mid SVC. The visualized bony structures of the thorax are intact. IMPRESSION: Interval marked decrease in central left upper lobe mass. No acute findings. Electronically Signed   By: Misty Stanley M.D.   On: 03/19/2017 08:31   Ct Chest Wo Contrast  Result Date: 03/24/2017 CLINICAL DATA:  Small-cell lung cancer. EXAM: CT CHEST WITHOUT CONTRAST TECHNIQUE: Multidetector CT imaging of the chest was performed following the standard protocol without IV contrast. COMPARISON:  PET-CT 11/06/2016. chest CT 10/29/2016. FINDINGS: Cardiovascular: Heart size normal. No substantial pericardial effusion. Coronary artery calcification is evident. Atherosclerotic calcification is noted in the wall of the thoracic aorta. Mediastinum/Nodes: Since the prior PET-CT, 15 mm AP window lymph node has decreased to 9 mm short axis. The previous 9 mm short axis precarinal lymph node is now 5 mm short axis. No bulky right hilar lymphadenopathy evident. Lungs/Pleura: Left upper lobe pulmonary mass measured previously on soft tissue windows at 5.6 x 6.5 cm now measures 2.0 x 4.1 cm. Biapical pleural-parenchymal scarring. Subsegmental atelectasis noted left upper lobe. No new pulmonary nodule or mass on today's study. No pleural effusion. Upper Abdomen: Unremarkable. Musculoskeletal: Bone windows reveal no worrisome lytic or sclerotic osseous lesions. IMPRESSION: 1. Interval  decrease in left upper lobe pulmonary mass tracking into the  upper left hilum. 2. Interval decrease in AP window and precarinal lymph nodes. 3. No new or progressive findings. 4.  Aortic Atherosclerois (ICD10-170.0) Electronically Signed   By: Misty Stanley M.D.   On: 03/24/2017 12:28   IMPRESSION: 1. Interval decrease in left upper lobe pulmonary mass tracking into the upper left hilum. 2. Interval decrease in AP window and precarinal lymph nodes. 3. No new or progressive findings. 4.  Aortic Atherosclerois (ICD10-170.0)   Electronically Signed   By: Misty Stanley M.D.   On: 03/24/2017 12:28  ASSESSMENT & PLAN:   Cancer of upper lobe of left lung ([place]) # SMALL CELL LUNG CA  LIMITED STAGE- left upper lobe- on concurrent carbo [09/19]-Etop with RT-finished December 2018.  #Mar 24 2017-CT scan shows significant improvement of the left upper lobe mass.  No new nodules noted.  Plan reimaging every 3-4 months.  #Primary cranial radiation-discussed the patient the benefits of primary cranial irradiation versus the toxicities.  They are interested.  Appointment with Dr. Donella Stade this afternoon.  # pleuritic pain- right chest wall-clinically not PE.  Continue muscle relaxant if not better PET.   # COPD- advair/albuterol.   # Anemia- CKD/chemo-stable -10-11 on PO iron.  Stable.  # Diabetes- brittle; recommend close monitoring;improved; BG-226; today- improved.  #Kidney creatinine-1.9 stable around baseline.  Monitor for now.  # I reviewed the blood work- with the patient in detail; also reviewed the imaging independently [as summarized above]; and with the patient in detail.   # port flush /6 weeks/ labs;    All questions were answered. The patient knows to call the clinic with any problems, questions or concerns.    Cammie Sickle, MD 03/27/2017 6:03 PM

## 2017-03-25 NOTE — Progress Notes (Signed)
Radiation Oncology Follow up Note  Name: Edwin Jones   Date:   03/25/2017 MRN:  [id number] DOB: 09-08-1940    This 77 y.o. male presents to the clinic today for follow-up for limited stage small cell lung cancer.  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 He still has completed [place] etoposide plus radiation therapy. His original MRI of his brain was negative. He has done well.. Recent CT scan of his chest showed marked decrease in the left upper lobe pulmonary mass and interval decrease in AP window and precarinal lymph nodes. No evidence of progressive disease. He is doing well specifically denies cough hemoptysis or chest tightness. He does have some bilateral rib pain may be associated with chronic cough.  COMPLICATIONS OF TREATMENT: none  FOLLOW UP COMPLIANCE: keeps appointments   PHYSICAL EXAM:  There were no vitals taken for this visit. Well-developed well-nourished patient in NAD. HEENT reveals PERLA, EOMI, discs not visualized.  Oral cavity is clear. No oral mucosal lesions are identified. Neck is clear without evidence of cervical or supraclavicular adenopathy. Lungs are clear to A&P. Cardiac examination is essentially unremarkable with regular rate and rhythm without murmur rub or thrill. Abdomen is benign with no organomegaly or masses noted. Motor sensory and DTR levels are equal and symmetric in the upper and lower extremities. Cranial nerves II through XII are grossly intact. Proprioception is intact. No peripheral adenopathy or edema is identified. No motor or sensory levels are noted. Crude visual fields are within normal range.  RADIOLOGY RESULTS: CT scans reviewed and compatible with the above-stated findings  PLAN: At this time like to go ahead with whole brain radiation therapy to prevent microscopic recurrence of disease. I would plan on  delivering 3000 cGy in 15 fractions to his whole brain. Risks and benefits of treatment including cognitive problems fatigue alteration of blood counts headache skin reaction and hair loss all were discussed in detail with the patient and his wife. They both seem to comprehend my treatment plan well. I have personally set up and ordered CT simulation of his whole brain next week.  I would like to take this opportunity to thank you for allowing me to participate in the care of your patient.Noreene Filbert, MD

## 2017-04-01 ENCOUNTER — Ambulatory Visit
Admission: RE | Admit: 2017-04-01 | Discharge: 2017-04-01 | Disposition: A | Discharge status: Home / Self Care | Payer: Medicare Other | Source: Ambulatory Visit | Attending: Radiation Oncology | Admitting: Radiation Oncology

## 2017-04-01 DIAGNOSIS — Z510 Encounter for antineoplastic radiation therapy: Secondary | ICD-10-CM | POA: Diagnosis not present

## 2017-04-01 DIAGNOSIS — Z9221 Personal history of antineoplastic chemotherapy: Secondary | ICD-10-CM | POA: Diagnosis not present

## 2017-04-01 DIAGNOSIS — Z87891 Personal history of nicotine dependence: Secondary | ICD-10-CM | POA: Diagnosis not present

## 2017-04-01 DIAGNOSIS — Z298 Encounter for other specified prophylactic measures: Secondary | ICD-10-CM | POA: Diagnosis not present

## 2017-04-01 DIAGNOSIS — C3411 Malignant neoplasm of upper lobe, right bronchus or lung: Secondary | ICD-10-CM | POA: Diagnosis not present

## 2017-04-01 DIAGNOSIS — R0781 Pleurodynia: Secondary | ICD-10-CM | POA: Diagnosis not present

## 2017-04-02 ENCOUNTER — Other Ambulatory Visit: Payer: Self-pay | Admitting: *Deleted

## 2017-04-02 DIAGNOSIS — C3412 Malignant neoplasm of upper lobe, left bronchus or lung: Secondary | ICD-10-CM

## 2017-04-02 MED ORDER — DEXAMETHASONE 4 MG PO TABS: 4.0000 mg | ORAL_TABLET | Freq: Every day | ORAL | 0 refills | Status: DC

## 2017-04-06 DIAGNOSIS — Z510 Encounter for antineoplastic radiation therapy: Secondary | ICD-10-CM | POA: Diagnosis not present

## 2017-04-08 ENCOUNTER — Ambulatory Visit
Admission: RE | Admit: 2017-04-08 | Discharge: 2017-04-08 | Disposition: A | Discharge status: Home / Self Care | Payer: Medicare Other | Source: Ambulatory Visit | Attending: Radiation Oncology | Admitting: Radiation Oncology

## 2017-04-08 DIAGNOSIS — Z510 Encounter for antineoplastic radiation therapy: Secondary | ICD-10-CM | POA: Diagnosis not present

## 2017-04-09 ENCOUNTER — Other Ambulatory Visit: Payer: Self-pay | Admitting: *Deleted

## 2017-04-12 ENCOUNTER — Ambulatory Visit
Admission: RE | Admit: 2017-04-12 | Discharge: 2017-04-12 | Disposition: A | Discharge status: Home / Self Care | Payer: Medicare Other | Source: Ambulatory Visit | Attending: Radiation Oncology | Admitting: Radiation Oncology

## 2017-04-12 DIAGNOSIS — Z510 Encounter for antineoplastic radiation therapy: Secondary | ICD-10-CM | POA: Diagnosis not present

## 2017-04-13 ENCOUNTER — Ambulatory Visit: Payer: Medicare Other

## 2017-04-14 ENCOUNTER — Ambulatory Visit
Admission: RE | Admit: 2017-04-14 | Discharge: 2017-04-14 | Disposition: A | Discharge status: Home / Self Care | Payer: Medicare Other | Source: Ambulatory Visit | Attending: Radiation Oncology | Admitting: Radiation Oncology

## 2017-04-14 DIAGNOSIS — Z510 Encounter for antineoplastic radiation therapy: Secondary | ICD-10-CM | POA: Diagnosis not present

## 2017-04-15 ENCOUNTER — Ambulatory Visit
Admission: RE | Admit: 2017-04-15 | Discharge: 2017-04-15 | Disposition: A | Discharge status: Home / Self Care | Payer: Medicare Other | Source: Ambulatory Visit | Attending: Radiation Oncology | Admitting: Radiation Oncology

## 2017-04-15 DIAGNOSIS — Z510 Encounter for antineoplastic radiation therapy: Secondary | ICD-10-CM | POA: Diagnosis not present

## 2017-04-16 ENCOUNTER — Ambulatory Visit
Admission: RE | Admit: 2017-04-16 | Discharge: 2017-04-16 | Disposition: A | Discharge status: Home / Self Care | Payer: Medicare Other | Source: Ambulatory Visit | Attending: Radiation Oncology | Admitting: Radiation Oncology

## 2017-04-16 DIAGNOSIS — Z510 Encounter for antineoplastic radiation therapy: Secondary | ICD-10-CM | POA: Diagnosis not present

## 2017-04-19 ENCOUNTER — Ambulatory Visit
Admission: RE | Admit: 2017-04-19 | Discharge: 2017-04-19 | Disposition: A | Discharge status: Home / Self Care | Payer: Medicare Other | Source: Ambulatory Visit | Attending: Radiation Oncology | Admitting: Radiation Oncology

## 2017-04-19 DIAGNOSIS — Z510 Encounter for antineoplastic radiation therapy: Secondary | ICD-10-CM | POA: Diagnosis not present

## 2017-04-20 ENCOUNTER — Ambulatory Visit
Admission: RE | Admit: 2017-04-20 | Discharge: 2017-04-20 | Disposition: A | Discharge status: Home / Self Care | Payer: Medicare Other | Source: Ambulatory Visit | Attending: Radiation Oncology | Admitting: Radiation Oncology

## 2017-04-20 ENCOUNTER — Inpatient Hospital Stay: Payer: Medicare Other | Attending: Internal Medicine

## 2017-04-20 DIAGNOSIS — Z510 Encounter for antineoplastic radiation therapy: Secondary | ICD-10-CM | POA: Diagnosis not present

## 2017-04-21 ENCOUNTER — Ambulatory Visit
Admission: RE | Admit: 2017-04-21 | Discharge: 2017-04-21 | Disposition: A | Discharge status: Home / Self Care | Payer: Medicare Other | Source: Ambulatory Visit | Attending: Radiation Oncology | Admitting: Radiation Oncology

## 2017-04-21 DIAGNOSIS — Z510 Encounter for antineoplastic radiation therapy: Secondary | ICD-10-CM | POA: Diagnosis not present

## 2017-04-22 ENCOUNTER — Ambulatory Visit
Admission: RE | Admit: 2017-04-22 | Discharge: 2017-04-22 | Disposition: A | Discharge status: Home / Self Care | Payer: Medicare Other | Source: Ambulatory Visit | Attending: Radiation Oncology | Admitting: Radiation Oncology

## 2017-04-22 DIAGNOSIS — Z510 Encounter for antineoplastic radiation therapy: Secondary | ICD-10-CM | POA: Diagnosis not present

## 2017-04-23 ENCOUNTER — Ambulatory Visit
Admission: RE | Admit: 2017-04-23 | Discharge: 2017-04-23 | Disposition: A | Discharge status: Home / Self Care | Payer: Medicare Other | Source: Ambulatory Visit | Attending: Radiation Oncology | Admitting: Radiation Oncology

## 2017-04-23 DIAGNOSIS — Z510 Encounter for antineoplastic radiation therapy: Secondary | ICD-10-CM | POA: Diagnosis not present

## 2017-04-26 ENCOUNTER — Ambulatory Visit
Admission: RE | Admit: 2017-04-26 | Discharge: 2017-04-26 | Disposition: A | Discharge status: Home / Self Care | Payer: Medicare Other | Source: Ambulatory Visit | Attending: Radiation Oncology | Admitting: Radiation Oncology

## 2017-04-26 DIAGNOSIS — Z510 Encounter for antineoplastic radiation therapy: Secondary | ICD-10-CM | POA: Diagnosis not present

## 2017-04-27 ENCOUNTER — Ambulatory Visit
Admission: RE | Admit: 2017-04-27 | Discharge: 2017-04-27 | Disposition: A | Discharge status: Home / Self Care | Payer: Medicare Other | Source: Ambulatory Visit | Attending: Radiation Oncology | Admitting: Radiation Oncology

## 2017-04-27 DIAGNOSIS — Z510 Encounter for antineoplastic radiation therapy: Secondary | ICD-10-CM | POA: Diagnosis not present

## 2017-04-28 ENCOUNTER — Ambulatory Visit
Admission: RE | Admit: 2017-04-28 | Discharge: 2017-04-28 | Disposition: A | Discharge status: Home / Self Care | Payer: Medicare Other | Source: Ambulatory Visit | Attending: Radiation Oncology | Admitting: Radiation Oncology

## 2017-04-28 DIAGNOSIS — Z510 Encounter for antineoplastic radiation therapy: Secondary | ICD-10-CM | POA: Diagnosis not present

## 2017-04-29 ENCOUNTER — Ambulatory Visit
Admission: RE | Admit: 2017-04-29 | Discharge: 2017-04-29 | Disposition: A | Discharge status: Home / Self Care | Payer: Medicare Other | Source: Ambulatory Visit | Attending: Radiation Oncology | Admitting: Radiation Oncology

## 2017-04-29 DIAGNOSIS — Z510 Encounter for antineoplastic radiation therapy: Secondary | ICD-10-CM | POA: Diagnosis not present

## 2017-04-30 ENCOUNTER — Ambulatory Visit
Admission: RE | Admit: 2017-04-30 | Discharge: 2017-04-30 | Disposition: A | Discharge status: Home / Self Care | Payer: Medicare Other | Source: Ambulatory Visit | Attending: Radiation Oncology | Admitting: Radiation Oncology

## 2017-04-30 ENCOUNTER — Ambulatory Visit: Payer: Medicare Other

## 2017-04-30 DIAGNOSIS — C7931 Secondary malignant neoplasm of brain: Secondary | ICD-10-CM | POA: Insufficient documentation

## 2017-04-30 DIAGNOSIS — Z87891 Personal history of nicotine dependence: Secondary | ICD-10-CM | POA: Insufficient documentation

## 2017-04-30 DIAGNOSIS — C3412 Malignant neoplasm of upper lobe, left bronchus or lung: Secondary | ICD-10-CM | POA: Diagnosis not present

## 2017-04-30 DIAGNOSIS — Z510 Encounter for antineoplastic radiation therapy: Secondary | ICD-10-CM | POA: Diagnosis present

## 2017-05-03 ENCOUNTER — Ambulatory Visit
Admission: RE | Admit: 2017-05-03 | Discharge: 2017-05-03 | Disposition: A | Discharge status: Home / Self Care | Payer: Medicare Other | Source: Ambulatory Visit | Attending: Radiation Oncology | Admitting: Radiation Oncology

## 2017-05-03 DIAGNOSIS — Z510 Encounter for antineoplastic radiation therapy: Secondary | ICD-10-CM | POA: Diagnosis not present

## 2017-05-05 ENCOUNTER — Inpatient Hospital Stay: Payer: Medicare Other

## 2017-05-05 ENCOUNTER — Inpatient Hospital Stay: Payer: Medicare Other | Admitting: Internal Medicine

## 2017-05-05 NOTE — Assessment & Plan Note (Deleted)
#   SMALL CELL LUNG CA  LIMITED STAGE- left upper lobe- on concurrent carbo [09/19]-Etop with RT-finished December 2018.  #Mar 24 2017-CT scan shows significant improvement of the left upper lobe mass.  No new nodules noted.  Plan reimaging every 3-4 months.  #Primary cranial radiation-discussed the patient the benefits of primary cranial irradiation versus the toxicities.  They are interested.  Appointment with Dr. Donella Stade this afternoon.  # pleuritic pain- right chest wall-clinically not PE.  Continue muscle relaxant if not better PET.   # COPD- advair/albuterol.   # Anemia- CKD/chemo-stable -10-11 on PO iron.  Stable.  # Diabetes- brittle; recommend close monitoring;improved; BG-226; today- improved.  #Kidney creatinine-1.9 stable around baseline.  Monitor for now.  # I reviewed the blood work- with the patient in detail; also reviewed the imaging independently [as summarized above]; and with the patient in detail.   # port flush /6 weeks/ labs;

## 2017-05-05 NOTE — Progress Notes (Deleted)
Edwin Jones NOTE  Patient Care Team: Marguerita Merles, MD as PCP - General (Family Medicine) Telford Nab, RN as Registered Nurse  CHIEF COMPLAINTS/PURPOSE OF CONSULTATION: LEFT LUNG MASS     Oncology History   # AUG 2018- LIMITED STAGE SMALL CELL LUNG CA ;[ T4N3M0] LEFT UPPER LUNG MASS- ~5-5.5cm; positive for M-LN. PET 2018- no distant mets.   # SEP 19th 2018- CARBO-ETOP-RT [finished RT- 01/13/2017]; last chemo 02/12/2017; Jan 2018- CT- PR.    # Sep 2018-MRI brain-NEG  # Hep C [? Immunization in [employer]; s/p Harvoni at [place]; 2017]; CKD Stage III     Cancer of upper lobe of left lung (HCC)   HISTORY OF PRESENTING ILLNESS:  Edwin Jones 77 y.o.  male recently diagnosed Limited stage small cell lung cancer- currently on Carbo etoposide along with radiation- Currently status post cycle # 4-is here for follow-up/reviewed the results of his restaging CAT scan.   Patient noted to have right chest wall pain for the last few days.  Pleuritic nature.  However, he denies any worsening shortness of breath.  Denies any fevers.  Denies any chills.  Blood sugars are better controlled; he has been taken off his metformin because of renal insufficiency.  Appetite is good.  No nausea no vomiting.  ROS: A complete 10 point review of system is done which is negative except mentioned above in history of present illness  MEDICAL HISTORY:  Past Medical History:  Diagnosis Date  . COPD (chronic obstructive pulmonary disease) ([place])   . Diabetes mellitus without complication ([place])   . GERD without esophagitis   . Hepatitis C virus    history of hep C treated with Harvoni  . Hypercholesterolemia   . Hypertension   . Seasonal allergies     SURGICAL HISTORY: Past Surgical History:  Procedure Laterality Date  . COLONOSCOPY     approximately 11 years ago  . FLEXIBLE BRONCHOSCOPY N/A 11/05/2016   Procedure: FLEXIBLE BRONCHOSCOPY;  Surgeon: Wilhelmina Mcardle, MD;  Location: [facility]  ORS;  Service: Pulmonary;  Laterality: N/A;  . PORTA CATH INSERTION N/A 11/23/2016   Procedure: Glori Luis Cath Insertion;  Surgeon: Algernon Huxley, MD;  Location: [facility];  Service: Cardiovascular;  Laterality: N/A;  . TONSILLECTOMY      SOCIAL HISTORY: Social History   Socioeconomic History  . Marital status: Married    Spouse name: Not on file  . Number of children: Not on file  . Years of education: Not on file  . Highest education level: Not on file  Social Needs  . Financial resource strain: Not on file  . Food insecurity - worry: Not on file  . Food insecurity - inability: Not on file  . Transportation needs - medical: Not on file  . Transportation needs - non-medical: Not on file  Occupational History  . Not on file  Tobacco Use  . Smoking status: Former Smoker    Packs/day: 1.00    Years: 50.00    Pack years: 50.00    Types: Cigarettes    Last attempt to quit: 04/07/2004    Years since quitting: 13.0  . Smokeless tobacco: Never Used  Substance and Sexual Activity  . Alcohol use: No    Comment: history of alcohol use  . Drug use: No  . Sexual activity: Yes  Other Topics Concern  . Not on file  Social History Narrative  . Not on file    FAMILY HISTORY: Family History  Problem Relation Age of Onset  . Skin cancer Mother   . Ovarian cancer Sister        sister was diagnosed as a teenager  . Throat cancer Brother 57       brother #1  . Lung cancer Sister 22  . Throat cancer Brother 79       brother #2  . Skin cancer Sister     ALLERGIES:  is allergic to penicillin g; shellfish-derived products; statins; erythromycin; tamsulosin; tuberculin ppd; and iodinated diagnostic agents.  MEDICATIONS:  Current Outpatient Medications  Medication Sig Dispense Refill  . albuterol (PROVENTIL HFA;VENTOLIN HFA) 108 (90 Base) MCG/ACT inhaler Inhale 2 puffs into the lungs every 6 (six) hours as needed for wheezing or shortness of breath. 1 Inhaler 2  . APPLE CIDER  VINEGAR PO Take 450 mg by mouth at bedtime.     Marland Kitchen aspirin EC 81 MG tablet Take 81 mg by mouth at bedtime.     . cetirizine (ZYRTEC) 10 MG tablet Take 10 mg by mouth daily.    . cholecalciferol (VITAMIN D) 1000 units tablet Take 1,000 Units by mouth daily.    . Coenzyme Q10 100 MG capsule Take 100 mg by mouth daily.     Marland Kitchen dexamethasone (DECADRON) 4 MG tablet Take 1 tablet (4 mg total) by mouth daily. Starting on the first day of radiation 25 tablet 0  . Flaxseed, Linseed, (FLAXSEED OIL) 1000 MG CAPS Take 1,000 mg by mouth at bedtime.     . fluticasone (FLONASE) 50 MCG/ACT nasal spray Place 1 spray into both nostrils daily as needed for allergies or rhinitis.    . Fluticasone-Salmeterol (ADVAIR DISKUS) 500-50 MCG/DOSE AEPB Inhale 1 puff into the lungs 2 (two) times daily. 1 each 3  . glipiZIDE (GLUCOTROL) 10 MG tablet Take 10 mg by mouth daily. Time release capsule    . glucagon (GLUCAGON EMERGENCY) 1 MG injection Inject 1 mg into the muscle once as needed. When blood glucose drops less than 50; and if patient has difficulty drinking. 1 each 12  . GNP GARLIC EXTRACT PO Take 7,628 mg by mouth daily.     . hydrochlorothiazide (HYDRODIURIL) 25 MG tablet Take 25 mg by mouth daily.    Marland Kitchen lidocaine-prilocaine (EMLA) cream Apply 1 application topically as needed. 30 g 3  . linagliptin (TRADJENTA) 5 MG TABS tablet Take 5 mg by mouth daily.    Marland Kitchen lisinopril (PRINIVIL,ZESTRIL) 10 MG tablet Take 10 mg by mouth daily.    . methocarbamol (ROBAXIN) 500 MG tablet Take 1 tablet (500 mg total) by mouth every 8 (eight) hours as needed for muscle spasms. 60 tablet 0  . Multiple Vitamin (MULTIVITAMIN WITH MINERALS) TABS tablet Take 1 tablet by mouth daily.    Marland Kitchen omeprazole (PRILOSEC) 20 MG capsule Take 20 mg by mouth every Monday, Wednesday, and Friday.     . ondansetron (ZOFRAN) 8 MG tablet Take 1 tablet (8 mg total) by mouth every 8 (eight) hours as needed for nausea or vomiting (start 3 days; after chemo). (Patient not  taking: Reported on 03/25/2017) 40 tablet 1  . prochlorperazine (COMPAZINE) 10 MG tablet Take 1 tablet (10 mg total) by mouth every 6 (six) hours as needed for nausea or vomiting. (Patient not taking: Reported on 03/25/2017) 40 tablet 1  . simethicone (MYLICON) 80 MG chewable tablet Chew 160 mg by mouth once.    Marland Kitchen ZINC-VITAMIN C MT Take 1 tablet daily by mouth.     No current  facility-administered medications for this visit.       Marland Kitchen  PHYSICAL EXAMINATION: ECOG PERFORMANCE STATUS: 1 - Symptomatic but completely ambulatory  There were no vitals filed for this visit. There were no vitals filed for this visit.  GENERAL: Well-nourished well-developed; Alert, no distress and comfortable.   With his wife.  EYES: no pallor or icterus OROPHARYNX: no thrush or ulceration; dentures.   NECK: supple, no masses felt LYMPH:  no palpable lymphadenopathy in the cervical, axillary or inguinal regions LUNGS: clear to auscultation and  No wheeze or crackles HEART/CVS: regular rate & rhythm and no murmurs; No lower extremity edema ABDOMEN: abdomen soft, non-tender and normal bowel sounds Musculoskeletal:no cyanosis of digits and no clubbing  PSYCH: alert & oriented x 3 with fluent speech NEURO: no focal motor/sensory deficits SKIN:  no rashes or significant lesions  LABORATORY DATA:  I have reviewed the data as listed Lab Results  Component Value Date   WBC 4.6 03/25/2017   HGB 11.7 (L) 03/25/2017   HCT 35.3 (L) 03/25/2017   MCV 95.2 03/25/2017   PLT 194 03/25/2017   Recent Labs    02/10/17 0847 02/17/17 1011 03/18/17 1512 03/25/17 0952  NA 135 137 137 135  K 4.8 5.2* 4.9 5.3*  CL 105 108 106 106  CO2 _0 GLUCOSE 172* 188* 123* 226*  BUN 33* 27* 27* 26*  CREATININE 2.01* 1.89* 1.98* 1.94*  CALCIUM 8.9 8.9 9.4 8.8*  GFRNONAA 30* 33* 31* 32*  GFRAA 35* 38* 36* 37*  PROT 7.6  --  8.1 7.2  ALBUMIN 3.6  --  4.1 3.7  AST 18  --  20 21  ALT 13*  --  10* 10*  ALKPHOS 88  --  87  83  BILITOT 0.3  --  0.5 0.5    RADIOGRAPHIC STUDIES: I have personally reviewed the radiological images as listed and agreed with the findings in the report. No results found. IMPRESSION: 1. Interval decrease in left upper lobe pulmonary mass tracking into the upper left hilum. 2. Interval decrease in AP window and precarinal lymph nodes. 3. No new or progressive findings. 4.  Aortic Atherosclerois (ICD10-170.0)   Electronically Signed   By: Misty Stanley M.D.   On: 03/24/2017 12:28  ASSESSMENT & PLAN:   No problem-specific Assessment & Plan notes found for this encounter.  All questions were answered. The patient knows to call the clinic with any problems, questions or concerns.    Cammie Sickle, MD 05/05/2017 8:52 AM

## 2017-05-06 ENCOUNTER — Other Ambulatory Visit: Payer: Self-pay

## 2017-05-06 ENCOUNTER — Inpatient Hospital Stay (HOSPITAL_COMMUNITY): Payer: Medicare Other

## 2017-05-06 ENCOUNTER — Other Ambulatory Visit (HOSPITAL_COMMUNITY): Payer: Medicare Other

## 2017-05-06 ENCOUNTER — Emergency Department (HOSPITAL_COMMUNITY): Payer: Medicare Other

## 2017-05-06 ENCOUNTER — Encounter (HOSPITAL_COMMUNITY): Payer: Self-pay | Admitting: *Deleted

## 2017-05-06 ENCOUNTER — Encounter (HOSPITAL_COMMUNITY)
Admission: EM | Disposition: A | Discharge status: Rehabilitation Facility | Payer: Self-pay | Source: Home / Self Care | Attending: Neurology

## 2017-05-06 ENCOUNTER — Telehealth: Payer: Self-pay | Admitting: Oncology

## 2017-05-06 ENCOUNTER — Inpatient Hospital Stay (HOSPITAL_COMMUNITY)
Admission: EM | Admit: 2017-05-06 | Discharge: 2017-05-11 | DRG: 024 | Disposition: A | Discharge status: Rehabilitation Facility | Payer: Medicare Other | Attending: Neurology | Admitting: Neurology

## 2017-05-06 ENCOUNTER — Telehealth: Payer: Self-pay | Admitting: *Deleted

## 2017-05-06 ENCOUNTER — Emergency Department (HOSPITAL_COMMUNITY): Payer: Medicare Other | Admitting: Anesthesiology

## 2017-05-06 DIAGNOSIS — E119 Type 2 diabetes mellitus without complications: Secondary | ICD-10-CM

## 2017-05-06 DIAGNOSIS — Z7982 Long term (current) use of aspirin: Secondary | ICD-10-CM

## 2017-05-06 DIAGNOSIS — I6522 Occlusion and stenosis of left carotid artery: Secondary | ICD-10-CM | POA: Diagnosis not present

## 2017-05-06 DIAGNOSIS — R05 Cough: Secondary | ICD-10-CM | POA: Diagnosis not present

## 2017-05-06 DIAGNOSIS — R2689 Other abnormalities of gait and mobility: Secondary | ICD-10-CM | POA: Diagnosis present

## 2017-05-06 DIAGNOSIS — D649 Anemia, unspecified: Secondary | ICD-10-CM | POA: Diagnosis present

## 2017-05-06 DIAGNOSIS — C3410 Malignant neoplasm of upper lobe, unspecified bronchus or lung: Secondary | ICD-10-CM | POA: Diagnosis present

## 2017-05-06 DIAGNOSIS — Z888 Allergy status to other drugs, medicaments and biological substances status: Secondary | ICD-10-CM

## 2017-05-06 DIAGNOSIS — R4701 Aphasia: Secondary | ICD-10-CM | POA: Diagnosis not present

## 2017-05-06 DIAGNOSIS — E785 Hyperlipidemia, unspecified: Secondary | ICD-10-CM | POA: Diagnosis present

## 2017-05-06 DIAGNOSIS — I950 Idiopathic hypotension: Secondary | ICD-10-CM | POA: Diagnosis not present

## 2017-05-06 DIAGNOSIS — Z880 Allergy status to penicillin: Secondary | ICD-10-CM

## 2017-05-06 DIAGNOSIS — R29708 NIHSS score 8: Secondary | ICD-10-CM | POA: Diagnosis present

## 2017-05-06 DIAGNOSIS — I639 Cerebral infarction, unspecified: Secondary | ICD-10-CM | POA: Diagnosis present

## 2017-05-06 DIAGNOSIS — R0989 Other specified symptoms and signs involving the circulatory and respiratory systems: Secondary | ICD-10-CM | POA: Diagnosis not present

## 2017-05-06 DIAGNOSIS — I63512 Cerebral infarction due to unspecified occlusion or stenosis of left middle cerebral artery: Principal | ICD-10-CM | POA: Diagnosis present

## 2017-05-06 DIAGNOSIS — R4189 Other symptoms and signs involving cognitive functions and awareness: Secondary | ICD-10-CM | POA: Diagnosis present

## 2017-05-06 DIAGNOSIS — I69320 Aphasia following cerebral infarction: Secondary | ICD-10-CM | POA: Diagnosis not present

## 2017-05-06 DIAGNOSIS — E1165 Type 2 diabetes mellitus with hyperglycemia: Secondary | ICD-10-CM | POA: Diagnosis not present

## 2017-05-06 DIAGNOSIS — C7931 Secondary malignant neoplasm of brain: Secondary | ICD-10-CM | POA: Diagnosis present

## 2017-05-06 DIAGNOSIS — G8191 Hemiplegia, unspecified affecting right dominant side: Secondary | ICD-10-CM | POA: Diagnosis present

## 2017-05-06 DIAGNOSIS — E1122 Type 2 diabetes mellitus with diabetic chronic kidney disease: Secondary | ICD-10-CM | POA: Diagnosis present

## 2017-05-06 DIAGNOSIS — Z9221 Personal history of antineoplastic chemotherapy: Secondary | ICD-10-CM

## 2017-05-06 DIAGNOSIS — I129 Hypertensive chronic kidney disease with stage 1 through stage 4 chronic kidney disease, or unspecified chronic kidney disease: Secondary | ICD-10-CM | POA: Diagnosis present

## 2017-05-06 DIAGNOSIS — R29810 Facial weakness: Secondary | ICD-10-CM | POA: Diagnosis not present

## 2017-05-06 DIAGNOSIS — J449 Chronic obstructive pulmonary disease, unspecified: Secondary | ICD-10-CM | POA: Diagnosis present

## 2017-05-06 DIAGNOSIS — I361 Nonrheumatic tricuspid (valve) insufficiency: Secondary | ICD-10-CM | POA: Diagnosis not present

## 2017-05-06 DIAGNOSIS — I959 Hypotension, unspecified: Secondary | ICD-10-CM | POA: Diagnosis not present

## 2017-05-06 DIAGNOSIS — Z91041 Radiographic dye allergy status: Secondary | ICD-10-CM

## 2017-05-06 DIAGNOSIS — R509 Fever, unspecified: Secondary | ICD-10-CM | POA: Diagnosis not present

## 2017-05-06 DIAGNOSIS — Z87891 Personal history of nicotine dependence: Secondary | ICD-10-CM | POA: Diagnosis not present

## 2017-05-06 DIAGNOSIS — I444 Left anterior fascicular block: Secondary | ICD-10-CM | POA: Diagnosis not present

## 2017-05-06 DIAGNOSIS — Z923 Personal history of irradiation: Secondary | ICD-10-CM | POA: Diagnosis not present

## 2017-05-06 DIAGNOSIS — K219 Gastro-esophageal reflux disease without esophagitis: Secondary | ICD-10-CM | POA: Diagnosis present

## 2017-05-06 DIAGNOSIS — Z7951 Long term (current) use of inhaled steroids: Secondary | ICD-10-CM

## 2017-05-06 DIAGNOSIS — R9389 Abnormal findings on diagnostic imaging of other specified body structures: Secondary | ICD-10-CM | POA: Diagnosis not present

## 2017-05-06 DIAGNOSIS — N289 Disorder of kidney and ureter, unspecified: Secondary | ICD-10-CM

## 2017-05-06 DIAGNOSIS — D62 Acute posthemorrhagic anemia: Secondary | ICD-10-CM | POA: Diagnosis not present

## 2017-05-06 DIAGNOSIS — I6389 Other cerebral infarction: Secondary | ICD-10-CM | POA: Diagnosis not present

## 2017-05-06 DIAGNOSIS — Z881 Allergy status to other antibiotic agents status: Secondary | ICD-10-CM | POA: Diagnosis not present

## 2017-05-06 DIAGNOSIS — R0602 Shortness of breath: Secondary | ICD-10-CM | POA: Diagnosis not present

## 2017-05-06 DIAGNOSIS — N189 Chronic kidney disease, unspecified: Secondary | ICD-10-CM

## 2017-05-06 DIAGNOSIS — I10 Essential (primary) hypertension: Secondary | ICD-10-CM | POA: Diagnosis present

## 2017-05-06 DIAGNOSIS — I63232 Cerebral infarction due to unspecified occlusion or stenosis of left carotid arteries: Secondary | ICD-10-CM | POA: Diagnosis not present

## 2017-05-06 DIAGNOSIS — R7309 Other abnormal glucose: Secondary | ICD-10-CM | POA: Diagnosis not present

## 2017-05-06 DIAGNOSIS — I619 Nontraumatic intracerebral hemorrhage, unspecified: Secondary | ICD-10-CM | POA: Diagnosis not present

## 2017-05-06 DIAGNOSIS — B1920 Unspecified viral hepatitis C without hepatic coma: Secondary | ICD-10-CM | POA: Diagnosis present

## 2017-05-06 DIAGNOSIS — C3412 Malignant neoplasm of upper lobe, left bronchus or lung: Secondary | ICD-10-CM | POA: Diagnosis not present

## 2017-05-06 DIAGNOSIS — K5900 Constipation, unspecified: Secondary | ICD-10-CM | POA: Diagnosis present

## 2017-05-06 DIAGNOSIS — I69351 Hemiplegia and hemiparesis following cerebral infarction affecting right dominant side: Secondary | ICD-10-CM | POA: Diagnosis not present

## 2017-05-06 DIAGNOSIS — N183 Chronic kidney disease, stage 3 (moderate): Secondary | ICD-10-CM | POA: Diagnosis present

## 2017-05-06 DIAGNOSIS — Z79899 Other long term (current) drug therapy: Secondary | ICD-10-CM

## 2017-05-06 DIAGNOSIS — I951 Orthostatic hypotension: Secondary | ICD-10-CM | POA: Diagnosis not present

## 2017-05-06 DIAGNOSIS — C3492 Malignant neoplasm of unspecified part of left bronchus or lung: Secondary | ICD-10-CM | POA: Diagnosis not present

## 2017-05-06 DIAGNOSIS — I69322 Dysarthria following cerebral infarction: Secondary | ICD-10-CM | POA: Diagnosis not present

## 2017-05-06 DIAGNOSIS — Z91013 Allergy to seafood: Secondary | ICD-10-CM

## 2017-05-06 HISTORY — PX: IR US GUIDE VASC ACCESS RIGHT: IMG2390

## 2017-05-06 HISTORY — PX: IR FLUORO GUIDE CV LINE RIGHT: IMG2283

## 2017-05-06 HISTORY — PX: IR PERCUTANEOUS ART THROMBECTOMY/INFUSION INTRACRANIAL INC DIAG ANGIO: IMG6087

## 2017-05-06 HISTORY — DX: Malignant (primary) neoplasm, unspecified: C80.1

## 2017-05-06 HISTORY — PX: IR INTRAVSC STENT CERV CAROTID W/O EMB-PROT MOD SED INC ANGIO: IMG2304

## 2017-05-06 HISTORY — PX: RADIOLOGY WITH ANESTHESIA: SHX6223

## 2017-05-06 HISTORY — PX: IR US GUIDE VASC ACCESS LEFT: IMG2389

## 2017-05-06 LAB — DIFFERENTIAL
Basophils Absolute: 0 10*3/uL (ref 0.0–0.1)
Basophils Relative: 0 %
EOS ABS: 0.1 10*3/uL (ref 0.0–0.7)
EOS PCT: 2 %
LYMPHS ABS: 0.8 10*3/uL (ref 0.7–4.0)
Lymphocytes Relative: 10 %
MONO ABS: 0.8 10*3/uL (ref 0.1–1.0)
MONOS PCT: 11 %
Neutro Abs: 6.3 10*3/uL (ref 1.7–7.7)
Neutrophils Relative %: 77 %

## 2017-05-06 LAB — COMPREHENSIVE METABOLIC PANEL
ALT: 13 U/L — ABNORMAL LOW (ref 17–63)
ANION GAP: 12 (ref 5–15)
AST: 16 U/L (ref 15–41)
Albumin: 3.1 g/dL — ABNORMAL LOW (ref 3.5–5.0)
Alkaline Phosphatase: 100 U/L (ref 38–126)
BILIRUBIN TOTAL: 0.4 mg/dL (ref 0.3–1.2)
BUN: 40 mg/dL — AB (ref 6–20)
CO2: 22 mmol/L (ref 22–32)
Calcium: 9.1 mg/dL (ref 8.9–10.3)
Chloride: 101 mmol/L (ref 101–111)
Creatinine, Ser: 1.98 mg/dL — ABNORMAL HIGH (ref 0.61–1.24)
GFR, EST AFRICAN AMERICAN: 36 mL/min — AB (ref >60)
GFR, EST NON AFRICAN AMERICAN: 31 mL/min — AB (ref >60)
Glucose, Bld: 383 mg/dL — ABNORMAL HIGH (ref 65–99)
POTASSIUM: 4.3 mmol/L (ref 3.5–5.1)
Sodium: 135 mmol/L (ref 135–145)
TOTAL PROTEIN: 6.6 g/dL (ref 6.5–8.1)

## 2017-05-06 LAB — GLUCOSE, CAPILLARY
GLUCOSE-CAPILLARY: 162 mg/dL — AB (ref 65–99)
GLUCOSE-CAPILLARY: 54 mg/dL — AB (ref 65–99)
GLUCOSE-CAPILLARY: 59 mg/dL — AB (ref 65–99)
GLUCOSE-CAPILLARY: 87 mg/dL (ref 65–99)
Glucose-Capillary: 279 mg/dL — ABNORMAL HIGH (ref 65–99)
Glucose-Capillary: 332 mg/dL — ABNORMAL HIGH (ref 65–99)
Glucose-Capillary: 72 mg/dL (ref 65–99)

## 2017-05-06 LAB — RAPID URINE DRUG SCREEN, HOSP PERFORMED
Amphetamines: NOT DETECTED
BARBITURATES: NOT DETECTED
Benzodiazepines: NOT DETECTED
Cocaine: NOT DETECTED
OPIATES: NOT DETECTED
TETRAHYDROCANNABINOL: NOT DETECTED

## 2017-05-06 LAB — I-STAT CHEM 8, ED
BUN: 37 mg/dL — AB (ref 6–20)
CALCIUM ION: 1.19 mmol/L (ref 1.15–1.40)
Chloride: 103 mmol/L (ref 101–111)
Creatinine, Ser: 1.8 mg/dL — ABNORMAL HIGH (ref 0.61–1.24)
Glucose, Bld: 366 mg/dL — ABNORMAL HIGH (ref 65–99)
HCT: 39 % (ref 39.0–52.0)
Hemoglobin: 13.3 g/dL (ref 13.0–17.0)
Potassium: 4.3 mmol/L (ref 3.5–5.1)
SODIUM: 137 mmol/L (ref 135–145)
TCO2: 25 mmol/L (ref 22–32)

## 2017-05-06 LAB — CBC
HEMATOCRIT: 38.4 % — AB (ref 39.0–52.0)
HEMOGLOBIN: 12.6 g/dL — AB (ref 13.0–17.0)
MCH: 31.3 pg (ref 26.0–34.0)
MCHC: 32.8 g/dL (ref 30.0–36.0)
MCV: 95.5 fL (ref 78.0–100.0)
Platelets: 185 10*3/uL (ref 150–400)
RBC: 4.02 MIL/uL — ABNORMAL LOW (ref 4.22–5.81)
RDW: 12.8 % (ref 11.5–15.5)
WBC: 8 10*3/uL (ref 4.0–10.5)

## 2017-05-06 LAB — APTT: aPTT: 26 seconds (ref 24–36)

## 2017-05-06 LAB — PROTIME-INR
INR: 0.92
Prothrombin Time: 12.3 seconds (ref 11.4–15.2)

## 2017-05-06 LAB — CBG MONITORING, ED: GLUCOSE-CAPILLARY: 389 mg/dL — AB (ref 65–99)

## 2017-05-06 LAB — I-STAT TROPONIN, ED: TROPONIN I, POC: 0 ng/mL (ref 0.00–0.08)

## 2017-05-06 LAB — MRSA PCR SCREENING: MRSA BY PCR: NEGATIVE

## 2017-05-06 SURGERY — IR WITH ANESTHESIA
Anesthesia: Choice

## 2017-05-06 MED ORDER — SENNOSIDES-DOCUSATE SODIUM 8.6-50 MG PO TABS: 1.0000 | ORAL_TABLET | Freq: Every evening | ORAL | Status: DC | PRN

## 2017-05-06 MED ORDER — ROCURONIUM BROMIDE 100 MG/10ML IV SOLN
INTRAVENOUS | Status: DC | PRN
  Administered 2017-05-06 (×2): 10 mg via INTRAVENOUS
  Administered 2017-05-06: 50 mg via INTRAVENOUS

## 2017-05-06 MED ORDER — IOPAMIDOL (ISOVUE-370) INJECTION 76%
INTRAVENOUS | Status: AC
  Administered 2017-05-06: 50 mL
  Filled 2017-05-06: qty 100

## 2017-05-06 MED ORDER — NITROGLYCERIN 1 MG/10 ML FOR IR/CATH LAB
INTRA_ARTERIAL | Status: AC | PRN
  Administered 2017-05-06: 75 ug via INTRA_ARTERIAL

## 2017-05-06 MED ORDER — SODIUM CHLORIDE 0.9 % IV SOLN
0.0000 ug/min | INTRAVENOUS | Status: DC
  Administered 2017-05-06: 20 ug/min via INTRAVENOUS
  Administered 2017-05-06: 30 ug/min via INTRAVENOUS
  Administered 2017-05-07: 15 ug/min via INTRAVENOUS
  Administered 2017-05-08: 10 ug/min via INTRAVENOUS
  Filled 2017-05-06 (×5): qty 1

## 2017-05-06 MED ORDER — SUGAMMADEX SODIUM 200 MG/2ML IV SOLN
INTRAVENOUS | Status: DC | PRN
  Administered 2017-05-06: 200 mg via INTRAVENOUS

## 2017-05-06 MED ORDER — HYDROCHLOROTHIAZIDE 25 MG PO TABS: 25.0000 mg | ORAL_TABLET | Freq: Every day | ORAL | Status: DC

## 2017-05-06 MED ORDER — ASPIRIN 325 MG PO TABS
ORAL_TABLET | ORAL | Status: AC
  Filled 2017-05-06: qty 1

## 2017-05-06 MED ORDER — EPHEDRINE SULFATE-NACL 50-0.9 MG/10ML-% IV SOSY
PREFILLED_SYRINGE | INTRAVENOUS | Status: DC | PRN
  Administered 2017-05-06: 5 mg via INTRAVENOUS

## 2017-05-06 MED ORDER — VANCOMYCIN HCL IN DEXTROSE 1-5 GM/200ML-% IV SOLN
INTRAVENOUS | Status: AC
  Filled 2017-05-06: qty 200

## 2017-05-06 MED ORDER — ASPIRIN 81 MG PO CHEW: 324.0000 mg | CHEWABLE_TABLET | Freq: Every day | ORAL | Status: DC

## 2017-05-06 MED ORDER — NITROGLYCERIN 1 MG/10 ML FOR IR/CATH LAB
INTRA_ARTERIAL | Status: AC
  Filled 2017-05-06: qty 10

## 2017-05-06 MED ORDER — ASPIRIN 325 MG PO TABS
ORAL_TABLET | ORAL | Status: AC
  Filled 2017-05-06: qty 2

## 2017-05-06 MED ORDER — INSULIN ASPART 100 UNIT/ML ~~LOC~~ SOLN
0.0000 [IU] | Freq: Every day | SUBCUTANEOUS | Status: DC
  Administered 2017-05-10: 2 [IU] via SUBCUTANEOUS

## 2017-05-06 MED ORDER — ESMOLOL HCL 100 MG/10ML IV SOLN
INTRAVENOUS | Status: DC | PRN
  Administered 2017-05-06 (×3): 20 mg via INTRAVENOUS

## 2017-05-06 MED ORDER — METHYLPREDNISOLONE SODIUM SUCC 125 MG IJ SOLR: 125.0000 mg | Freq: Once | INTRAMUSCULAR | Status: DC

## 2017-05-06 MED ORDER — LISINOPRIL 10 MG PO TABS: 10.0000 mg | ORAL_TABLET | Freq: Every day | ORAL | Status: DC

## 2017-05-06 MED ORDER — IOPAMIDOL (ISOVUE-300) INJECTION 61%
INTRAVENOUS | Status: AC
  Filled 2017-05-06: qty 150

## 2017-05-06 MED ORDER — ACETAMINOPHEN 325 MG PO TABS
650.0000 mg | ORAL_TABLET | ORAL | Status: DC | PRN
  Administered 2017-05-09 – 2017-05-10 (×2): 650 mg via ORAL
  Filled 2017-05-06 (×2): qty 2

## 2017-05-06 MED ORDER — ASPIRIN 325 MG PO TABS
ORAL_TABLET | ORAL | Status: DC | PRN
  Administered 2017-05-06: 650 mg

## 2017-05-06 MED ORDER — INSULIN ASPART 100 UNIT/ML ~~LOC~~ SOLN
0.0000 [IU] | Freq: Three times a day (TID) | SUBCUTANEOUS | Status: DC
  Administered 2017-05-06: 3 [IU] via SUBCUTANEOUS
  Administered 2017-05-06: 11 [IU] via SUBCUTANEOUS
  Administered 2017-05-07: 3 [IU] via SUBCUTANEOUS
  Administered 2017-05-07: 5 [IU] via SUBCUTANEOUS
  Administered 2017-05-07 – 2017-05-10 (×7): 3 [IU] via SUBCUTANEOUS
  Administered 2017-05-11: 5 [IU] via SUBCUTANEOUS
  Administered 2017-05-11: 3 [IU] via SUBCUTANEOUS

## 2017-05-06 MED ORDER — ALBUMIN HUMAN 5 % IV SOLN
12.5000 g | Freq: Four times a day (QID) | INTRAVENOUS | Status: AC
  Administered 2017-05-06 – 2017-05-07 (×4): 12.5 g via INTRAVENOUS
  Filled 2017-05-06 (×4): qty 250

## 2017-05-06 MED ORDER — METHYLPREDNISOLONE SODIUM SUCC 125 MG IJ SOLR
INTRAMUSCULAR | Status: AC
  Filled 2017-05-06: qty 2

## 2017-05-06 MED ORDER — PROPOFOL 10 MG/ML IV BOLUS
INTRAVENOUS | Status: DC | PRN
  Administered 2017-05-06: 180 mg via INTRAVENOUS

## 2017-05-06 MED ORDER — DEXTROSE 5 % IV SOLN
INTRAVENOUS | Status: DC | PRN
  Administered 2017-05-06: 25 ug/min via INTRAVENOUS

## 2017-05-06 MED ORDER — ACETAMINOPHEN 160 MG/5ML PO SOLN
650.0000 mg | ORAL | Status: DC | PRN
Start: 2017-05-06 — End: 2017-05-11

## 2017-05-06 MED ORDER — HEPARIN SODIUM (PORCINE) 5000 UNIT/ML IJ SOLN: 5000.0000 [IU] | Freq: Three times a day (TID) | INTRAMUSCULAR | Status: DC

## 2017-05-06 MED ORDER — INSULIN ASPART 100 UNIT/ML ~~LOC~~ SOLN
4.0000 [IU] | Freq: Three times a day (TID) | SUBCUTANEOUS | Status: DC
  Administered 2017-05-06 – 2017-05-11 (×13): 4 [IU] via SUBCUTANEOUS

## 2017-05-06 MED ORDER — GLYCOPYRROLATE 0.2 MG/ML IJ SOLN
INTRAMUSCULAR | Status: DC | PRN
  Administered 2017-05-06: .4 mg via INTRAVENOUS

## 2017-05-06 MED ORDER — DIPHENHYDRAMINE HCL 50 MG/ML IJ SOLN
INTRAMUSCULAR | Status: AC
  Filled 2017-05-06: qty 1

## 2017-05-06 MED ORDER — SODIUM CHLORIDE 0.9 % IV SOLN
INTRAVENOUS | Status: DC | PRN
  Administered 2017-05-06: 05:00:00 via INTRAVENOUS

## 2017-05-06 MED ORDER — IOPAMIDOL (ISOVUE-300) INJECTION 61%
INTRAVENOUS | Status: AC
  Administered 2017-05-06: 144 mL
  Filled 2017-05-06: qty 150

## 2017-05-06 MED ORDER — FLUTICASONE FUROATE-VILANTEROL 200-25 MCG/INH IN AEPB
1.0000 | INHALATION_SPRAY | Freq: Every day | RESPIRATORY_TRACT | Status: DC
  Administered 2017-05-08 – 2017-05-11 (×4): 1 via RESPIRATORY_TRACT
  Filled 2017-05-06 (×2): qty 28

## 2017-05-06 MED ORDER — GLIPIZIDE 5 MG PO TABS
10.0000 mg | ORAL_TABLET | Freq: Every day | ORAL | Status: DC
  Administered 2017-05-06 – 2017-05-11 (×6): 10 mg via ORAL
  Filled 2017-05-06: qty 1
  Filled 2017-05-06: qty 2
  Filled 2017-05-06: qty 1
  Filled 2017-05-06: qty 2
  Filled 2017-05-06 (×2): qty 1

## 2017-05-06 MED ORDER — SODIUM CHLORIDE 0.9 % IV SOLN
INTRAVENOUS | Status: AC
  Administered 2017-05-06: 22:00:00 via INTRAVENOUS

## 2017-05-06 MED ORDER — ASPIRIN 325 MG PO TABS
325.0000 mg | ORAL_TABLET | Freq: Every day | ORAL | Status: DC
  Administered 2017-05-07 – 2017-05-11 (×5): 325 mg via ORAL
  Filled 2017-05-06 (×5): qty 1

## 2017-05-06 MED ORDER — DEXTROSE 50 % IV SOLN
25.0000 mL | Freq: Once | INTRAVENOUS | Status: AC
  Administered 2017-05-06: 25 mL via INTRAVENOUS

## 2017-05-06 MED ORDER — LIDOCAINE HCL (CARDIAC) 20 MG/ML IV SOLN
INTRAVENOUS | Status: DC | PRN
  Administered 2017-05-06: 60 mg via INTRATRACHEAL

## 2017-05-06 MED ORDER — LINAGLIPTIN 5 MG PO TABS
5.0000 mg | ORAL_TABLET | Freq: Every day | ORAL | Status: DC
  Administered 2017-05-06 – 2017-05-11 (×6): 5 mg via ORAL
  Filled 2017-05-06 (×6): qty 1

## 2017-05-06 MED ORDER — SUCCINYLCHOLINE CHLORIDE 20 MG/ML IJ SOLN
INTRAMUSCULAR | Status: DC | PRN
  Administered 2017-05-06: 100 mg via INTRAVENOUS

## 2017-05-06 MED ORDER — NICARDIPINE HCL IN NACL 20-0.86 MG/200ML-% IV SOLN: 0.0000 mg/h | INTRAVENOUS | Status: DC

## 2017-05-06 MED ORDER — EPTIFIBATIDE 75 MG/100ML IV SOLN
1.0000 ug/kg/min | INTRAVENOUS | Status: AC
  Administered 2017-05-06 – 2017-05-07 (×3): 1 ug/kg/min via INTRAVENOUS
  Filled 2017-05-06 (×6): qty 100

## 2017-05-06 MED ORDER — FAMOTIDINE IN NACL 20-0.9 MG/50ML-% IV SOLN
20.0000 mg | Freq: Once | INTRAVENOUS | Status: DC
  Filled 2017-05-06: qty 50

## 2017-05-06 MED ORDER — SODIUM CHLORIDE 0.9 % IV SOLN
INTRAVENOUS | Status: AC
  Administered 2017-05-06 (×2): via INTRAVENOUS

## 2017-05-06 MED ORDER — DIPHENHYDRAMINE HCL 50 MG/ML IJ SOLN
25.0000 mg | Freq: Once | INTRAMUSCULAR | Status: AC
  Administered 2017-05-06: 25 mg via INTRAVENOUS

## 2017-05-06 MED ORDER — EPTIFIBATIDE 20 MG/10ML IV SOLN
20.0000 mg | Freq: Once | INTRAVENOUS | Status: DC
  Filled 2017-05-06 (×2): qty 10

## 2017-05-06 MED ORDER — FENTANYL CITRATE (PF) 100 MCG/2ML IJ SOLN
INTRAMUSCULAR | Status: AC
  Filled 2017-05-06: qty 2

## 2017-05-06 MED ORDER — DEXAMETHASONE 4 MG PO TABS: 4.0000 mg | ORAL_TABLET | Freq: Every day | ORAL | Status: DC

## 2017-05-06 MED ORDER — VANCOMYCIN HCL 1000 MG IV SOLR
INTRAVENOUS | Status: DC | PRN
  Administered 2017-05-06: 1000 mg via INTRAVENOUS

## 2017-05-06 MED ORDER — DEXTROSE 50 % IV SOLN
INTRAVENOUS | Status: AC
  Filled 2017-05-06: qty 50

## 2017-05-06 MED ORDER — ALBUTEROL SULFATE (2.5 MG/3ML) 0.083% IN NEBU: 3.0000 mL | INHALATION_SOLUTION | Freq: Four times a day (QID) | RESPIRATORY_TRACT | Status: DC | PRN

## 2017-05-06 MED ORDER — FENTANYL CITRATE (PF) 250 MCG/5ML IJ SOLN
INTRAMUSCULAR | Status: DC | PRN
  Administered 2017-05-06 (×2): 50 ug via INTRAVENOUS

## 2017-05-06 MED ORDER — ONDANSETRON HCL 4 MG/2ML IJ SOLN: 4.0000 mg | Freq: Four times a day (QID) | INTRAMUSCULAR | Status: DC | PRN

## 2017-05-06 MED ORDER — ACETAMINOPHEN 650 MG RE SUPP
650.0000 mg | RECTAL | Status: DC | PRN
Start: 2017-05-06 — End: 2017-05-11

## 2017-05-06 MED ORDER — STROKE: EARLY STAGES OF RECOVERY BOOK
Freq: Once | Status: DC
  Filled 2017-05-06: qty 1

## 2017-05-06 MED ORDER — HEPARIN SODIUM (PORCINE) 5000 UNIT/ML IJ SOLN
5000.0000 [IU] | Freq: Three times a day (TID) | INTRAMUSCULAR | Status: DC
  Administered 2017-05-06 – 2017-05-11 (×14): 5000 [IU] via SUBCUTANEOUS
  Filled 2017-05-06 (×14): qty 1

## 2017-05-06 NOTE — ED Triage Notes (Signed)
Patient presents to ed via Edwin Jones states patient and his wife were watching a movie went to bed at 0100am , patient got up at approx. 2am to go to the bathroom and wasn't able to get up. Called 911 upon ems arrival patient was totally flaccid on the right side,right sided facial droop.

## 2017-05-06 NOTE — Progress Notes (Signed)
PT Cancellation Note  Patient Details Name: Edwin Jones MRN: [id number] DOB: 1940/11/11   Cancelled Treatment:    Reason Eval/Treat Not Completed: Active bedrest order .  Pt still has femoral sheath from IR procedure.  PT to check back tomorrow.  Thanks,   Barbarann Ehlers. [place], [place], DPT [phone]   05/06/2017, 10:29 AM

## 2017-05-06 NOTE — Sedation Documentation (Signed)
8 Fr sheath removed from R femoral artery by Dr. Earleen Newport. Hemostasis achieved using Angioseal closure device. Groin level 0.

## 2017-05-06 NOTE — Progress Notes (Signed)
Patient's femoral A-line had kinked and would not read BP's or draw blood back. Verbal order to pull it by Dr. Erlinda Hong. Holding pressure at this time. Will go by cuff pressures to titrate pressor. Karis Rilling, Rande Brunt, RN

## 2017-05-06 NOTE — ED Notes (Signed)
Family at bedside. 

## 2017-05-06 NOTE — Progress Notes (Signed)
Hypoglycemic Event  CBG: 54  Treatment: 15G OF CARBOHYDRATE (ORANGE JUICE)  Symptoms: None  Follow-up CBG: Time: 1738 CBG Result: 59  Possible Reasons for Event: Unknown      Darnelle Maffucci

## 2017-05-06 NOTE — ED Notes (Signed)
Returned to ED . Waiting IR arrival. Patient is alert oriented to place day and name.

## 2017-05-06 NOTE — H&P (View-Only) (Signed)
Referring Physician: Dr. Roxanne Mins    Chief Complaint: Acute onset of right hemiparesis and facial droop  HPI: Edwin Jones is an 77 y.o. male with known small cell cancer with brain metastases who presents with acute onset of right hemiparesis and facial droop. He was in his USOH when he went to bed at 1 AM. On awakening to go to the bathroom, he noted right sided weakness. He woke up his wife, who called EMS. Vitals en route: CBG 476, BP 179/90. He has no prior history of stroke.   LSN: [id number] tPA Given: No: Recent whole brain irradiation, presenting increased risk for hemorrhage NIHSS: 8  Past Medical History:  Diagnosis Date  . COPD (chronic obstructive pulmonary disease) ([place])   . Diabetes mellitus without complication ([place])   . GERD without esophagitis   . Hepatitis C virus    history of hep C treated with Harvoni  . Hypercholesterolemia   . Hypertension   . Seasonal allergies     Past Surgical History:  Procedure Laterality Date  . COLONOSCOPY     approximately 11 years ago  . FLEXIBLE BRONCHOSCOPY N/A 11/05/2016   Procedure: FLEXIBLE BRONCHOSCOPY;  Surgeon: Wilhelmina Mcardle, MD;  Location: [facility] ORS;  Service: Pulmonary;  Laterality: N/A;  . PORTA CATH INSERTION N/A 11/23/2016   Procedure: Glori Luis Cath Insertion;  Surgeon: Algernon Huxley, MD;  Location: [facility];  Service: Cardiovascular;  Laterality: N/A;  . TONSILLECTOMY      Family History  Problem Relation Age of Onset  . Skin cancer Mother   . Ovarian cancer Sister        sister was diagnosed as a teenager  . Throat cancer Brother 58       brother #1  . Lung cancer Sister 44  . Throat cancer Brother 17       brother #2  . Skin cancer Sister    Social History:  reports that he quit smoking about 13 years ago. His smoking use included cigarettes. He has a 50.00 pack-year smoking history. he has never used smokeless tobacco. He reports that he does not drink alcohol or use drugs.  Allergies:  Allergies    Allergen Reactions  . Penicillin G Anaphylaxis    Other reaction(s): Other (See Comments) Couldn't breath among other things Has patient had a PCN reaction causing immediate rash, facial/tongue/throat swelling, SOB or lightheadedness with hypotension: Yes Has patient had a PCN reaction causing severe rash involving mucus membranes or skin necrosis: No Has patient had a PCN reaction that required hospitalization: Yes Has patient had a PCN reaction occurring within the last 10 years: No If all of the above answers are "NO", then may proceed with Ce  . Shellfish-Derived Products Anaphylaxis  . Statins     Other reaction(s): Other (See Comments) Muscle spasms - can't walk - couldn't turn over in bed  . Erythromycin Itching    All mycins  . Tamsulosin     Other reaction(s): Dizziness  . Tuberculin Ppd     Other reaction(s): Other (See Comments) False test   . Iodinated Diagnostic Agents Hives    Home Medications:    ROS: As per HPI. Detailed ROS deferred due to acuity of presentation.   Physical Examination: There were no vitals taken for this visit.  HEENT: [place]/AT Lungs: Respirations unlabored Ext: Warm and well perfused  Neurologic Examination: Mental Status: Alert, poor orientation. Speech dysarthric with word finding deficit and decreased fluency. Able to follow most commands,  some with difficulty. Mildly impaired repetition.  Cranial Nerves: II:  Visual fields intact bilaterally. PERRL.  III,IV, VI: Some hesitancy gazing to the right V,VII: Right facial droop. Facial temp sensation equal bilaterally VIII: hearing intact to voice IX,X: Pharyngeal dysarthria XI: Decreased strength on left XII: midline tongue extension  Motor: RUE and RLE 4/5 with drift LUE and LLE 5/5 Normal tone throughout; no atrophy noted Sensory: Temp and light touch intact x 4 proximally. Extinction on the left Deep Tendon Reflexes:  Unremarkable Plantars: Right: downgoing  Left:  downgoing Cerebellar: Slow FNF on the right Gait: Deferred  CT head: No acute stroke, hemorrhage, or mass effect identified. 2. Dense left M1, possible thrombus. 3. ASPECTS is 10 4. Stable chronic microvascular ischemic changes and parenchymal volume loss of the brain given differences in technique.  CTA head/neck:  1. 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 Short segment of patency of left ICA terminus and proximal left M1 via anterior communicating artery. 2. Left mid M1 occlusion with poor left MCA collateralization. 3. Patent right carotid and bilateral vertebral systems. 4. Patent bilateral ACA, right MCA, and bilateral PCA distributions. Intracranial atherosclerosis with multiple segments of mild-to-moderate stenosis  EKG: Sinus rhythm, LAFB  Assessment: 77 y.o. male with small cell lung CA, presenting with acute onset of right hemiparesis and facial droop.  1. Exam findings most consistent with localization to left cerebral hemisphere 2. NIHSS: 8 3. CT head: Dense left M1. No acute hypodensity 4. 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 Short segment of patency of left ICA terminus and proximal left M1 via anterior communicating artery. Left mid M1 occlusion with poor left MCA collateralization. 5. Stroke Risk Factors - DM, hypercholesterolemia and HTN 6. Hepatitis C treated with Harvoni. 7. Has statin allergy.  8. Allergy to iodinated contrast requiring premedication for CTA 9. Classifiable as having failed ASA monotherapy  Plan: 1. The patient was not considered a safe candidate for tPA given recent whole brain irradiation with possible small vessel fragility and increased risk for hemorrhage. Discussed with patient who expressed agreement 2. The patient is a candidate for VIR. Discussed risks and benefits with patient, who tentatively agrees to have procedure done. Final consent to be obtained by VIR team.  3. Admit to ICU following VIR 4. BP  management 5. No anticoagulants for at least 24 hours following VIR. ASA decision per VIR team. Repeat CT at 24 hours. 6. MRI of the brain without contrast 7. PT consult, OT consult, Speech consult 8. Echocardiogram 9. DVT prophylaxis with SCDs 10. Telemetry monitoring 11. Frequent neuro checks 12. HgbA1c, fasting lipid panel  80 minutes spent in the emergent neurological evaluation and management of this critically ill patient  _0  signed: Dr. Kerney Elbe 05/06/2017, 3:18 AM

## 2017-05-06 NOTE — Anesthesia Preprocedure Evaluation (Addendum)
Anesthesia Evaluation  Patient identified by MRN, date of birth, ID band Patient awake    Reviewed: H&P Preop documentation limited or incomplete due to emergent nature of procedure.  Airway Mallampati: II   Neck ROM: full    Dental  (+) Upper Dentures, Lower Dentures   Pulmonary COPD, former smoker,    breath sounds clear to auscultation       Cardiovascular hypertension,  Rhythm:regular Rate:Normal     Neuro/Psych CVA    GI/Hepatic GERD  ,(+) Hepatitis -, C  Endo/Other  diabetes, Type 2  Renal/GU Renal InsufficiencyRenal disease     Musculoskeletal   Abdominal   Peds  Hematology   Anesthesia Other Findings   Reproductive/Obstetrics                            Anesthesia Physical Anesthesia Plan  ASA: III and emergent  Anesthesia Plan: General   Post-op Pain Management:    Induction: Intravenous, Rapid sequence and Cricoid pressure planned  PONV Risk Score and Plan: 2 and Ondansetron, Dexamethasone and Treatment may vary due to age or medical condition  Airway Management Planned: Oral ETT  Additional Equipment:   Intra-op Plan:   Post-operative Plan: Extubation in OR  Informed Consent: I have reviewed the patients History and Physical, chart, labs and discussed the procedure including the risks, benefits and alternatives for the proposed anesthesia with the patient or authorized representative who has indicated his/her understanding and acceptance.   Only emergency history available  Plan Discussed with: CRNA, Anesthesiologist and Surgeon  Anesthesia Plan Comments:        Anesthesia Quick Evaluation

## 2017-05-06 NOTE — Sedation Documentation (Signed)
Anesthesia case

## 2017-05-06 NOTE — Telephone Encounter (Signed)
Patient is in ER at [facility] due to acute stroke. He called answering service and wants call back at [phone].   Attempted to call patient multiple times at [phone], patient did not answer.

## 2017-05-06 NOTE — Progress Notes (Signed)
STROKE TEAM PROGRESS NOTE   SUBJECTIVE (INTERVAL HISTORY) His RN is at the bedside.  Overall he feels his condition is stable. He just came to ICU from IR suite. Had left ICA stent and left MCA thrombectomy. Still has integrelin IV infusion running which will be 24 hours. He still has left gaze preference, right neglect but moving right LE strong and mild RUE drift.   OBJECTIVE Temp:  [97.4 F (36.3 C)-98.7 F (37.1 C)] 97.7 F (36.5 C) (02/28 1042) Pulse Rate:  [51-76] 51 (02/28 1100) Resp:  [10-18] 14 (02/28 1100) BP: (95-161)/(55-87) 98/55 (02/28 1100) SpO2:  [98 %-100 %] 98 % (02/28 1100) Arterial Line BP: (111-135)/(48-61) 111/48 (02/28 1100) Weight:  [210 lb 12.2 oz (95.6 kg)-212 lb 1.3 oz (96.2 kg)] 212 lb 1.3 oz (96.2 kg) (02/28 0930)  Recent Labs  Lab 05/06/17 0309 05/06/17 0743 05/06/17 0844 05/06/17 1141  GLUCAP 389* 279* 332* 162*   Recent Labs  Lab 05/06/17 0312 05/06/17 0329  NA 135 137  K 4.3 4.3  CL 101 103  CO2 22  --   GLUCOSE 383* 366*  BUN 40* 37*  CREATININE 1.98* 1.80*  CALCIUM 9.1  --    Recent Labs  Lab 05/06/17 0312  AST 16  ALT 13*  ALKPHOS 100  BILITOT 0.4  PROT 6.6  ALBUMIN 3.1*   Recent Labs  Lab 05/06/17 0312 05/06/17 0329  WBC 8.0  --   NEUTROABS 6.3  --   HGB 12.6* 13.3  HCT 38.4* 39.0  MCV 95.5  --   PLT 185  --    No results for input(s): CKTOTAL, CKMB, CKMBINDEX, TROPONINI in the last 168 hours. Recent Labs    05/06/17 0312  LABPROT 12.3  INR 0.92   No results for input(s): COLORURINE, LABSPEC, PHURINE, GLUCOSEU, HGBUR, BILIRUBINUR, KETONESUR, PROTEINUR, UROBILINOGEN, NITRITE, LEUKOCYTESUR in the last 72 hours.  Invalid input(s): APPERANCEUR  No results found for: CHOL, TRIG, HDL, CHOLHDL, VLDL, LDLCALC No results found for: HGBA1C    Component Value Date/Time   LABOPIA NONE DETECTED 05/06/2017 1011   COCAINSCRNUR NONE DETECTED 05/06/2017 1011   LABBENZ NONE DETECTED 05/06/2017 1011   AMPHETMU NONE  DETECTED 05/06/2017 1011   THCU NONE DETECTED 05/06/2017 1011   LABBARB NONE DETECTED 05/06/2017 1011    No results for input(s): ETH in the last 168 hours.  I have personally reviewed the radiological images below and agree with the radiology interpretations.  Ct Angio Head W Or Wo Contrast  Result Date: 05/06/2017 CLINICAL DATA:  77 y/o M; right-sided weakness, numbness, slurred speech. EXAM: CT ANGIOGRAPHY HEAD AND NECK TECHNIQUE: Multidetector CT imaging of the head and neck was performed using the standard protocol during bolus administration of intravenous contrast. Multiplanar CT image reconstructions and MIPs were obtained to evaluate the vascular anatomy. Carotid stenosis measurements (when applicable) are obtained utilizing NASCET criteria, using the distal internal carotid diameter as the denominator. CONTRAST:  75mL ISOVUE-370 IOPAMIDOL (ISOVUE-370) INJECTION 76% COMPARISON:  None. FINDINGS: CTA NECK FINDINGS Aortic arch: Standard branching. Imaged portion shows no evidence of aneurysm or dissection. No significant stenosis of the major arch vessel origins. Right carotid system: No evidence of dissection, stenosis (50% or greater) or occlusion. Left carotid system: Patent left common carotid artery. Left ICA occlusion to the terminus where there is a short segment of reconstitution of the terminal ICA and proximal left M1 via the anterior communicating artery. Vertebral arteries: Codominant. No evidence of dissection, stenosis (50% or greater) or occlusion.  Skeleton: Moderate cervical spine spondylosis with multilevel discogenic degenerative changes greatest at the C4 through C7 levels or uncovertebral and facet hypertrophy encroach on neural foramen and there is mild canal stenosis. Other neck: Negative. Upper chest: Right port catheter in the SVC extending below field of view. Review of the MIP images confirms the above findings CTA HEAD FINDINGS Anterior circulation: Left mid M1 occlusion  with poor left MCA distribution collateral circulation. Right distal M1 mild stenosis. No additional large vessel occlusion, aneurysm, or vascular malformation in the anterior circulation. Posterior circulation: No significant stenosis, proximal occlusion, aneurysm, or vascular malformation. Right P2 moderate stenosis and left P2 mild stenosis. Venous sinuses: As permitted by contrast timing, patent. Anatomic variants: Patent anterior and bilateral posterior communicating arteries. Review of the MIP images confirms the above findings IMPRESSION: 1. 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 Short segment of patency of left ICA terminus and proximal left M1 via anterior communicating artery. 2. Left mid M1 occlusion with poor left MCA collateralization. 3. Patent right carotid and bilateral vertebral systems. 4. Patent bilateral ACA, right MCA, and bilateral PCA distributions. Intracranial atherosclerosis with multiple segments of mild-to-moderate stenosis. These results were called by telephone at the time of interpretation on 05/06/2017 at 4:10 am to Dr. Cheral Marker, who verbally acknowledged these results. Electronically Signed   By: Kristine Garbe M.D.   On: 05/06/2017 04:17   Ct Angio Neck W And/or Wo Contrast  Result Date: 05/06/2017 CLINICAL DATA:  77 y/o M; right-sided weakness, numbness, slurred speech. EXAM: CT ANGIOGRAPHY HEAD AND NECK TECHNIQUE: Multidetector CT imaging of the head and neck was performed using the standard protocol during bolus administration of intravenous contrast. Multiplanar CT image reconstructions and MIPs were obtained to evaluate the vascular anatomy. Carotid stenosis measurements (when applicable) are obtained utilizing NASCET criteria, using the distal internal carotid diameter as the denominator. CONTRAST:  79mL ISOVUE-370 IOPAMIDOL (ISOVUE-370) INJECTION 76% COMPARISON:  None. FINDINGS: CTA NECK FINDINGS Aortic arch: Standard branching. Imaged portion shows no  evidence of aneurysm or dissection. No significant stenosis of the major arch vessel origins. Right carotid system: No evidence of dissection, stenosis (50% or greater) or occlusion. Left carotid system: Patent left common carotid artery. Left ICA occlusion to the terminus where there is a short segment of reconstitution of the terminal ICA and proximal left M1 via the anterior communicating artery. Vertebral arteries: Codominant. No evidence of dissection, stenosis (50% or greater) or occlusion. Skeleton: Moderate cervical spine spondylosis with multilevel discogenic degenerative changes greatest at the C4 through C7 levels or uncovertebral and facet hypertrophy encroach on neural foramen and there is mild canal stenosis. Other neck: Negative. Upper chest: Right port catheter in the SVC extending below field of view. Review of the MIP images confirms the above findings CTA HEAD FINDINGS Anterior circulation: Left mid M1 occlusion with poor left MCA distribution collateral circulation. Right distal M1 mild stenosis. No additional large vessel occlusion, aneurysm, or vascular malformation in the anterior circulation. Posterior circulation: No significant stenosis, proximal occlusion, aneurysm, or vascular malformation. Right P2 moderate stenosis and left P2 mild stenosis. Venous sinuses: As permitted by contrast timing, patent. Anatomic variants: Patent anterior and bilateral posterior communicating arteries. Review of the MIP images confirms the above findings IMPRESSION: 1. 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 Short segment of patency of left ICA terminus and proximal left M1 via anterior communicating artery. 2. Left mid M1 occlusion with poor left MCA collateralization. 3. Patent right carotid and bilateral vertebral systems. 4.  Patent bilateral ACA, right MCA, and bilateral PCA distributions. Intracranial atherosclerosis with multiple segments of mild-to-moderate stenosis. These results were called by  telephone at the time of interpretation on 05/06/2017 at 4:10 am to Dr. Cheral Marker, who verbally acknowledged these results. Electronically Signed   By: Kristine Garbe M.D.   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 The antibiotic was administered within 1 hour of the procedure 75 MCG NITROGLYCERIN INTRA ARTERIAL 650 mg aspirin via orogastric tube. Intra procedural initiation of Integrilin IV drip for dual anti-platelet therapy. ANESTHESIA/SEDATION: General endotracheal tube anesthesia with anesthesia team CONTRAST:  144 cc IV contrast FLUOROSCOPY TIME:  Fluoroscopy Time: 54 minutes 6 seconds (2,926 mGy). COMPLICATIONS: None TECHNIQUE: Informed written consent was obtained from the patient's family after a thorough discussion of the procedural risks, benefits and alternatives. Specific risks discussed include: Bleeding, infection, contrast reaction, kidney injury/failure, need for further procedure/surgery, arterial injury or dissection, embolization to new territory, intracranial hemorrhage (10-15% risk), neurologic deterioration, cardiopulmonary collapse,  death. All questions were addressed. Maximal Sterile Barrier Technique was utilized including during the procedure including caps, mask, sterile gowns, sterile gloves, sterile drape, hand hygiene and skin antiseptic. A timeout was performed prior to the initiation of the procedure. FINDINGS: Initial angiogram: Left common carotid artery:  Normal course caliber and contour. Left external carotid artery: Patent with antegrade flow. On the initial injection before treatment, there is minimal opacification of the carotid siphon secondary to meningeal branches which appear to be region ating from ascending pharyngeal distribution and potentially deep meningeal branches from the external carotid artery. No significant filling of the MCA on the initial injection The initial injection does confirmed perfusion of the lenticulostriate vasculature, as well as a patent anterior coronal artery, with minimal filling of temporal lobe. Anterior basal ganglia structures are perfused. Left internal carotid artery: Internal carotid artery is occluded at the origin with a string sign at the posterior aspect of the artery. Left MCA: Initial injection demonstrates no significant filling of the middle cerebral artery. 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 Left ACA: Anterior cerebral artery perfused by the right-sided flow at the initiation. Final angiogram: After treatment of the carotid occlusion and deployment of 8 mm-6 mm x 40 mm carotid stent, there is excellent flow through the cervical carotid to the intracranial carotid. After mechanical thrombectomy of M1 occlusion, there is modified treatment in cerebral ischemia of 3. Flat panel CT: No complications with small contrast stain of the left basal ganglia. PROCEDURE: Patient was brought to the neurointerventional suite, with the patient identity confirmed, and consent form confirmed. General anesthesia was present for  initiation of general endotracheal tube anesthesia. Patient was prepped and draped in the usual sterile fashion.  The only venous access was a port catheter with single needle access. Ultrasound survey of the right inguinal region was performed with images stored and sent to PACs. 11 blade scalpel was used to make a small incision. A micropuncture needle was used access the right common femoral artery under ultrasound. With excellent arterial blood flow returned, an .018 micro wire was passed through the needle, observed to enter the abdominal aorta under fluoroscopy. The needle was removed, and a micropuncture sheath was placed over the wire. The inner dilator and wire were removed, and an 035 Bentson wire was advanced under fluoroscopy into the abdominal aorta. The sheath was removed and a standard 5 [place] vascular sheath was placed. The dilator was removed and the sheath was flushed. After the initial common femoral artery access, ultrasound survey of the left inguinal region was performed in order to place arterial monitoring line. Images stored and sent to PACs. Micropuncture needle was used access the left common femoral artery under ultrasound. With excellent arterial blood flow returned, an .018 micro wire was passed through the needle, observed to enter the abdominal aorta under fluoroscopy. The needle was removed, and a micropuncture sheath was placed over the wire. The inner dilator and wire were removed, and an 035 Bentson wire was advanced under fluoroscopy into the abdominal aorta. The sheath was removed and a standard 4 [place] vascular sheath was placed. The dilator was removed and the sheath was flushed. A 32F JB-1 diagnostic catheter was advanced over the wire through the right common femoral artery access to the proximal descending thoracic aorta. Wire was then removed. Double flush of the catheter was performed. Catheter was then used to select the left common carotid artery. Angiogram was  performed. Using roadmap technique, the catheter was advanced over a roadrunner wire into right common carotid artery. Formal angiogram was performed. Catheter was advanced over the roadrunner wire into the external carotid artery. Wire was removed. Exchange length Rosen wire was then passed through the diagnostic catheter to the distal common carotid artery and the diagnostic catheter was removed. The 5 French sheath was removed and exchanged for 8 French 55 centimeter BrightTip sheath. Sheath was flushed and attached to pressurized and heparinized saline bag for constant forward flow. Then an 8 [place], 95 cm Flowgate balloon tip catheter was prepared on the back table with inflation of the balloon with 50/50 concentration of dilute contrast. The balloon catheter was then advanced over the wire, positioned into the distal common carotid artery. Copious back flush was performed and the balloon catheter was attached to heparinized and pressurized saline bag for forward flow. Roadmap was then performed at the carotid bifurcation. Combination of a rapid transit microwire and a synchro soft wire were used to navigate beyond the occlusion at the proximal internal carotid artery. The microcatheter was then navigated into the distal cervical segment, wire was removed, blood was aspirated, and a gentle injection confirmed a luminal position. Exchange length Transcend wire was then placed through the microcatheter into the cervical carotid. Microcatheter was removed. Balloon angioplasty was then performed with a rapid exchange system, using a Viatrac balloon 5 mm x 30 mm. The patient required treatment at this time with atropine dose, as we experienced bradycardia into the 30s. His heart rate recovered after treatment by the anesthesia team. Balloon was removed. Angiogram confirmed flow into the cervical carotid. A coaxial system of an Ace 64 penumbra aspiration catheter and the Trevo pro view 18 catheter was then advanced  over the  exchange length wire into the cervical carotid. Once the coaxial system was in the cervical carotid, the microwire was removed and exchanged for synchro soft. The synchro soft wire was navigated beyond the occlusion under roadmap guidance, and the aspiration catheter was advanced into the ICA terminus. The balloon guide catheter was then advanced into the mid segment of the cervical carotid artery. The microcatheter and microwire were then carefully advanced through the occluded segment. Microcatheter was then push through the occluded segment and the wire was removed. Blood was then aspirated through the hub of the microcatheter, and a gentle contrast injection was performed confirming intraluminal position. A rotating hemostatic valve was then attached to the back end of the microcatheter, and a pressurized and heparinized saline bag was attached to the catheter. 4 x 40 solitaire device was then selected. Back flush was achieved at the rotating hemostatic valve, and then the device was gently advanced through the microcatheter to the distal end. The retriever was then unsheathed by withdrawing the microcatheter under fluoroscopy. A 5 minutes interval was observed. At this time, ultrasound-guided access of the right common femoral vein was performed for placement of a additional venous access and placement of a 12 French triple-lumen IV catheter. We then proceeded with the mechanical thrombectomy. The balloon at the balloon tip catheter was then inflated under fluoroscopy for proximal flow arrest. Constant aspiration was then performed at the penumbra aspiration catheter at the carotid terminus as the retriever was gently and slowly withdrawn with fluoroscopic observation. Once the retriever was entirely removed from the system, free aspiration was confirmed at the hub of the aspiration catheter, with free blood return confirmed. The balloon was then deflated, rotating hemostatic valve was reattached, and  a control angiogram was performed, confirming restoration of flow. Penumbra catheter was then withdrawn, and the angiogram was performed at the carotid bifurcation. Given the appearance, we elected to place acute carotid stent. The lesion was then again navigated with synchro soft wire and rapid transit. With the microwire microcatheter positioned in the distal cervical carotid, the microwire was removed. The exchange length Transcend wire was then replaced and the microcatheter was removed. Acute carotid stent was then deployed, 8 mm-6 mm x 40 mm length. Before deployment, we initiated Integrilin drip treatment for dual anti-platelet therapy. Angiogram was performed. Coaxial system of the penumbra aspiration catheter and the pro view 18 catheter were then navigated to the carotid siphon for better angiogram of the cerebral vasculature. Angiogram was performed. Microcatheter, a number aspiration catheter were withdrawn. The balloon guide catheter was positioned in the common carotid artery for final angiogram. All catheter and wires were removed. The sheath at the right common femoral artery access was withdrawn for an angiogram. Eight French Angio-Seal was then deployed for hemostasis. A flat panel CT was then performed.  Patient was then extubated. Estimated blood loss was 50 cc No complications encountered. IMPRESSION: Status post left-sided cerebral angiogram with treatment of left ICA/MCA tandem occlusion, requiring acute left carotid stenting and mechanical thrombectomy of a left M1 occlusion with achievement of mTICI-3 flow, and restoration of cervical ICA flow. Flat panel CT in the room demonstrates small contrast stain without complicating features, and the patient was extubated. Status post deployment of 8 French Angio-Seal for right common femoral artery hemostasis. Status post ultrasound-guided left common femoral artery access with a 4 French sheath for arterial monitoring, which remained after the  case. Status post ultrasound-guided right common femoral vein access for additional IV access, which remained  after the case transmitting the Integrilin drip. Signed, Dulcy Fanny. Earleen Newport, DO [facility] Radiology PLAN: Patient will go to the PACU ICU admission Bilateral hips will be straight until the left 4 French arterial monitoring sheath is removed and the right common femoral vein venous access sheath is removed. Routine wound management for the right common femoral artery sheath access. Frequent neurovascular checks. IV saline hydration for 8 hours given the renal insufficiency Blood pressure control management of 120-140 with nicardipine drip ordered The goal will be dual anti-platelet therapy, for now with dose of 650 mg aspirin and the Integrilin drip. Electronically Signed   By: Corrie Mckusick D.O.   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 The antibiotic was administered within 1 hour of the procedure 75 MCG NITROGLYCERIN INTRA ARTERIAL 650 mg aspirin via orogastric tube. Intra procedural  initiation of Integrilin IV drip for dual anti-platelet therapy. ANESTHESIA/SEDATION: General endotracheal tube anesthesia with anesthesia team CONTRAST:  144 cc IV contrast FLUOROSCOPY TIME:  Fluoroscopy Time: 54 minutes 6 seconds (2,926 mGy). COMPLICATIONS: None TECHNIQUE: Informed written consent was obtained from the patient's family after a thorough discussion of the procedural risks, benefits and alternatives. Specific risks discussed include: Bleeding, infection, contrast reaction, kidney injury/failure, need for further procedure/surgery, arterial injury or dissection, embolization to new territory, intracranial hemorrhage (10-15% risk), neurologic deterioration, cardiopulmonary collapse, death. All questions were addressed. Maximal Sterile Barrier Technique was utilized including during the procedure including caps, mask, sterile gowns, sterile gloves, sterile drape, hand hygiene and skin antiseptic. A timeout was performed prior to the initiation of the procedure. FINDINGS: Initial angiogram: Left common carotid artery:  Normal course caliber and contour. Left external carotid artery: Patent with antegrade flow. On the initial injection before treatment, there is minimal opacification of the carotid siphon secondary to meningeal branches which appear to be region ating from ascending pharyngeal distribution and potentially deep meningeal branches from the external carotid artery. No significant filling of the MCA on the initial injection The initial injection does confirmed perfusion of the lenticulostriate vasculature, as well as a patent anterior coronal artery, with minimal filling of temporal lobe. Anterior basal ganglia structures are perfused. Left internal carotid artery: Internal carotid artery is occluded at the origin with a string sign at the posterior aspect of the artery. Left MCA: Initial injection demonstrates no significant filling of the middle cerebral artery. Once the catheter was  navigated into the carotid siphon for angiogram, and M1 occlusion was confirmed. Left ACA: Anterior cerebral artery perfused by the right-sided flow at the initiation. Final angiogram: After treatment of the carotid occlusion and deployment of 8 mm-6 mm x 40 mm carotid stent, there is excellent flow through the cervical carotid to the intracranial  carotid. After mechanical thrombectomy of M1 occlusion, there is modified treatment in cerebral ischemia of 3. Flat panel CT: No complications with small contrast stain of the left basal ganglia. PROCEDURE: Patient was brought to the neurointerventional suite, with the patient identity confirmed, and consent form confirmed. General anesthesia was present for initiation of general endotracheal tube anesthesia. Patient was prepped and draped in the usual sterile fashion. The only venous access was a port catheter with single needle access. Ultrasound survey of the right inguinal region was performed with images stored and sent to PACs. 11 blade scalpel was used to make a small incision. A micropuncture needle was used access the right common femoral artery under ultrasound. With excellent arterial blood flow returned, an .018 micro wire was passed through the needle, observed to enter the abdominal aorta under fluoroscopy. The needle was removed, and a micropuncture sheath was placed over the wire. The inner dilator and wire were removed, and an 035 Bentson wire was advanced under fluoroscopy into the abdominal aorta. The sheath was removed and a standard 5 [place] vascular sheath was placed. The dilator was removed and the sheath was flushed. After the initial common femoral artery access, ultrasound survey of the left inguinal region was performed in order to place arterial monitoring line. Images stored and sent to PACs. Micropuncture needle was used access the left common femoral artery under ultrasound. With excellent arterial blood flow returned, an .018 micro wire was  passed through the needle, observed to enter the abdominal aorta under fluoroscopy. The needle was removed, and a micropuncture sheath was placed over the wire. The inner dilator and wire were removed, and an 035 Bentson wire was advanced under fluoroscopy into the abdominal aorta. The sheath was removed and a standard 4 [place] vascular sheath was placed. The dilator was removed and the sheath was flushed. A 65F JB-1 diagnostic catheter was advanced over the wire through the right common femoral artery access to the proximal descending thoracic aorta. Wire was then removed. Double flush of the catheter was performed. Catheter was then used to select the left common carotid artery. Angiogram was performed. Using roadmap technique, the catheter was advanced over a roadrunner wire into right common carotid artery. Formal angiogram was performed. Catheter was advanced over the roadrunner wire into the external carotid artery. Wire was removed. Exchange length Rosen wire was then passed through the diagnostic catheter to the distal common carotid artery and the diagnostic catheter was removed. The 5 French sheath was removed and exchanged for 8 French 55 centimeter BrightTip sheath. Sheath was flushed and attached to pressurized and heparinized saline bag for constant forward flow. Then an 8 [place], 95 cm Flowgate balloon tip catheter was prepared on the back table with inflation of the balloon with 50/50 concentration of dilute contrast. The balloon catheter was then advanced over the wire, positioned into the distal common carotid artery. Copious back flush was performed and the balloon catheter was attached to heparinized and pressurized saline bag for forward flow. Roadmap was then performed at the carotid bifurcation. Combination of a rapid transit microwire and a synchro soft wire were used to navigate beyond the occlusion at the proximal internal carotid artery. The microcatheter was then navigated into the distal  cervical segment, wire was removed, blood was aspirated, and a gentle injection confirmed a luminal position. Exchange length Transcend wire was then placed through the microcatheter into the cervical carotid. Microcatheter was removed. Balloon angioplasty was then performed with a rapid exchange system,  using a Viatrac balloon 5 mm x 30 mm. The patient required treatment at this time with atropine dose, as we experienced bradycardia into the 30s. His heart rate recovered after treatment by the anesthesia team. Balloon was removed. Angiogram confirmed flow into the cervical carotid. A coaxial system of an Ace 64 penumbra aspiration catheter and the Trevo pro view 18 catheter was then advanced over the exchange length wire into the cervical carotid. Once the coaxial system was in the cervical carotid, the microwire was removed and exchanged for synchro soft. The synchro soft wire was navigated beyond the occlusion under roadmap guidance, and the aspiration catheter was advanced into the ICA terminus. The balloon guide catheter was then advanced into the mid segment of the cervical carotid artery. The microcatheter and microwire were then carefully advanced through the occluded segment. Microcatheter was then push through the occluded segment and the wire was removed. Blood was then aspirated through the hub of the microcatheter, and a gentle contrast injection was performed confirming intraluminal position. A rotating hemostatic valve was then attached to the back end of the microcatheter, and a pressurized and heparinized saline bag was attached to the catheter. 4 x 40 solitaire device was then selected. Back flush was achieved at the rotating hemostatic valve, and then the device was gently advanced through the microcatheter to the distal end. The retriever was then unsheathed by withdrawing the microcatheter under fluoroscopy. A 5 minutes interval was observed. At this time, ultrasound-guided access of the right  common femoral vein was performed for placement of a additional venous access and placement of a 12 French triple-lumen IV catheter. We then proceeded with the mechanical thrombectomy. The balloon at the balloon tip catheter was then inflated under fluoroscopy for proximal flow arrest. Constant aspiration was then performed at the penumbra aspiration catheter at the carotid terminus as the retriever was gently and slowly withdrawn with fluoroscopic observation. Once the retriever was entirely removed from the system, free aspiration was confirmed at the hub of the aspiration catheter, with free blood return confirmed. The balloon was then deflated, rotating hemostatic valve was reattached, and a control angiogram was performed, confirming restoration of flow. Penumbra catheter was then withdrawn, and the angiogram was performed at the carotid bifurcation. Given the appearance, we elected to place acute carotid stent. The lesion was then again navigated with synchro soft wire and rapid transit. With the microwire microcatheter positioned in the distal cervical carotid, the microwire was removed. The exchange length Transcend wire was then replaced and the microcatheter was removed. Acute carotid stent was then deployed, 8 mm-6 mm x 40 mm length. Before deployment, we initiated Integrilin drip treatment for dual anti-platelet therapy. Angiogram was performed. Coaxial system of the penumbra aspiration catheter and the pro view 18 catheter were then navigated to the carotid siphon for better angiogram of the cerebral vasculature. Angiogram was performed. Microcatheter, a number aspiration catheter were withdrawn. The balloon guide catheter was positioned in the common carotid artery for final angiogram. All catheter and wires were removed. The sheath at the right common femoral artery access was withdrawn for an angiogram. Eight French Angio-Seal was then deployed for hemostasis. A flat panel CT was then performed.   Patient was then extubated. Estimated blood loss was 50 cc No complications encountered. IMPRESSION: Status post left-sided cerebral angiogram with treatment of left ICA/MCA tandem occlusion, requiring acute left carotid stenting and mechanical thrombectomy of a left M1 occlusion with achievement of mTICI-3 flow, and restoration of cervical  ICA flow. Flat panel CT in the room demonstrates small contrast stain without complicating features, and the patient was extubated. Status post deployment of 8 French Angio-Seal for right common femoral artery hemostasis. Status post ultrasound-guided left common femoral artery access with a 4 French sheath for arterial monitoring, which remained after the case. Status post ultrasound-guided right common femoral vein access for additional IV access, which remained after the case transmitting the Integrilin drip. Signed, Dulcy Fanny. Earleen Newport, DO [facility] Radiology PLAN: Patient will go to the PACU ICU admission Bilateral hips will be straight until the left 4 French arterial monitoring sheath is removed and the right common femoral vein venous access sheath is removed. Routine wound management for the right common femoral artery sheath access. Frequent neurovascular checks. IV saline hydration for 8 hours given the renal insufficiency Blood pressure control management of 120-140 with nicardipine drip ordered The goal will be dual anti-platelet therapy, for now with dose of 650 mg aspirin and the Integrilin drip. Electronically Signed   By: Corrie Mckusick D.O.   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 The antibiotic was administered within 1 hour of the procedure 75 MCG NITROGLYCERIN INTRA ARTERIAL 650 mg aspirin via orogastric tube. Intra procedural initiation of Integrilin IV drip for dual anti-platelet therapy. ANESTHESIA/SEDATION: General endotracheal tube anesthesia with anesthesia team CONTRAST:  144 cc IV contrast FLUOROSCOPY TIME:  Fluoroscopy Time: 54 minutes 6 seconds (2,926 mGy). COMPLICATIONS: None TECHNIQUE: Informed written consent was obtained from the patient's family after a thorough discussion of the procedural risks, benefits and alternatives. Specific risks discussed include: Bleeding, infection, contrast reaction, kidney injury/failure, need for further procedure/surgery, arterial injury or dissection, embolization to new territory, intracranial hemorrhage (10-15% risk), neurologic deterioration, cardiopulmonary collapse, death. All questions were addressed. Maximal Sterile Barrier Technique was utilized including during the procedure including caps, mask, sterile gowns, sterile gloves, sterile drape, hand hygiene and skin antiseptic. A timeout was performed prior to the initiation of the procedure. FINDINGS: Initial angiogram: Left common carotid artery:  Normal course caliber and contour. Left external carotid artery: Patent with antegrade flow. On the initial injection before treatment, there is minimal opacification of the carotid siphon secondary to meningeal  branches which appear to be region ating from ascending pharyngeal distribution and potentially deep meningeal branches from the external carotid artery. No significant filling of the MCA on the initial injection The initial injection does confirmed perfusion of the lenticulostriate vasculature, as well as a patent anterior coronal artery, with minimal filling of temporal lobe. Anterior basal ganglia structures are perfused. Left internal carotid artery: Internal carotid artery is occluded at the origin with a string sign at the posterior aspect of the artery. Left MCA: Initial injection demonstrates no significant  filling of the middle cerebral artery. 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 Left ACA: Anterior cerebral artery perfused by the right-sided flow at the initiation. Final angiogram: After treatment of the carotid occlusion and deployment of 8 mm-6 mm x 40 mm carotid stent, there is excellent flow through the cervical carotid to the intracranial carotid. After mechanical thrombectomy of M1 occlusion, there is modified treatment in cerebral ischemia of 3. Flat panel CT: No complications with small contrast stain of the left basal ganglia. PROCEDURE: Patient was brought to the neurointerventional suite, with the patient identity confirmed, and consent form confirmed. General anesthesia was present for initiation of general endotracheal tube anesthesia. Patient was prepped and draped in the usual sterile fashion. The only venous access was a port catheter with single needle access. Ultrasound survey of the right inguinal region was performed with images stored and sent to PACs. 11 blade scalpel was used to make a small incision. A micropuncture needle was used access the right common femoral artery under ultrasound. With excellent arterial blood flow returned, an .018 micro wire was passed through the needle, observed to enter the abdominal aorta  under fluoroscopy. The needle was removed, and a micropuncture sheath was placed over the wire. The inner dilator and wire were removed, and an 035 Bentson wire was advanced under fluoroscopy into the abdominal aorta. The sheath was removed and a standard 5 [place] vascular sheath was placed. The dilator was removed and the sheath was flushed. After the initial common femoral artery access, ultrasound survey of the left inguinal region was performed in order to place arterial monitoring line. Images stored and sent to PACs. Micropuncture needle was used access the left common femoral artery under ultrasound. With excellent arterial blood flow returned, an .018 micro wire was passed through the needle, observed to enter the abdominal aorta under fluoroscopy. The needle was removed, and a micropuncture sheath was placed over the wire. The inner dilator and wire were removed, and an 035 Bentson wire was advanced under fluoroscopy into the abdominal aorta. The sheath was removed and a standard 4 [place] vascular sheath was placed. The dilator was removed and the sheath was flushed. A 70F JB-1 diagnostic catheter was advanced over the wire through the right common femoral artery access to the proximal descending thoracic aorta. Wire was then removed. Double flush of the catheter was performed. Catheter was then used to select the left common carotid artery. Angiogram was performed. Using roadmap technique, the catheter was advanced over a roadrunner wire into right common carotid artery. Formal angiogram was performed. Catheter was advanced over the roadrunner wire into the external carotid artery. Wire was removed. Exchange length Rosen wire was then passed through the diagnostic catheter to the distal common carotid artery and the diagnostic catheter was removed. The 5 French sheath was removed and exchanged for 8 French 55 centimeter BrightTip sheath. Sheath was flushed and attached to pressurized and heparinized saline  bag for constant forward flow. Then an 8 [place], 95 cm Flowgate balloon tip catheter was prepared on the back table with inflation of the balloon with 50/50 concentration of dilute contrast. The balloon catheter was then advanced over the wire, positioned into the distal common carotid artery. Copious back flush was performed and the balloon catheter was attached to heparinized and pressurized saline bag for forward flow. Roadmap was then performed at the carotid bifurcation. Combination of a rapid transit microwire and a synchro soft  wire were used to navigate beyond the occlusion at the proximal internal carotid artery. The microcatheter was then navigated into the distal cervical segment, wire was removed, blood was aspirated, and a gentle injection confirmed a luminal position. Exchange length Transcend wire was then placed through the microcatheter into the cervical carotid. Microcatheter was removed. Balloon angioplasty was then performed with a rapid exchange system, using a Viatrac balloon 5 mm x 30 mm. The patient required treatment at this time with atropine dose, as we experienced bradycardia into the 30s. His heart rate recovered after treatment by the anesthesia team. Balloon was removed. Angiogram confirmed flow into the cervical carotid. A coaxial system of an Ace 64 penumbra aspiration catheter and the Trevo pro view 18 catheter was then advanced over the exchange length wire into the cervical carotid. Once the coaxial system was in the cervical carotid, the microwire was removed and exchanged for synchro soft. The synchro soft wire was navigated beyond the occlusion under roadmap guidance, and the aspiration catheter was advanced into the ICA terminus. The balloon guide catheter was then advanced into the mid segment of the cervical carotid artery. The microcatheter and microwire were then carefully advanced through the occluded segment. Microcatheter was then push through the occluded segment and  the wire was removed. Blood was then aspirated through the hub of the microcatheter, and a gentle contrast injection was performed confirming intraluminal position. A rotating hemostatic valve was then attached to the back end of the microcatheter, and a pressurized and heparinized saline bag was attached to the catheter. 4 x 40 solitaire device was then selected. Back flush was achieved at the rotating hemostatic valve, and then the device was gently advanced through the microcatheter to the distal end. The retriever was then unsheathed by withdrawing the microcatheter under fluoroscopy. A 5 minutes interval was observed. At this time, ultrasound-guided access of the right common femoral vein was performed for placement of a additional venous access and placement of a 12 French triple-lumen IV catheter. We then proceeded with the mechanical thrombectomy. The balloon at the balloon tip catheter was then inflated under fluoroscopy for proximal flow arrest. Constant aspiration was then performed at the penumbra aspiration catheter at the carotid terminus as the retriever was gently and slowly withdrawn with fluoroscopic observation. Once the retriever was entirely removed from the system, free aspiration was confirmed at the hub of the aspiration catheter, with free blood return confirmed. The balloon was then deflated, rotating hemostatic valve was reattached, and a control angiogram was performed, confirming restoration of flow. Penumbra catheter was then withdrawn, and the angiogram was performed at the carotid bifurcation. Given the appearance, we elected to place acute carotid stent. The lesion was then again navigated with synchro soft wire and rapid transit. With the microwire microcatheter positioned in the distal cervical carotid, the microwire was removed. The exchange length Transcend wire was then replaced and the microcatheter was removed. Acute carotid stent was then deployed, 8 mm-6 mm x 40 mm length.  Before deployment, we initiated Integrilin drip treatment for dual anti-platelet therapy. Angiogram was performed. Coaxial system of the penumbra aspiration catheter and the pro view 18 catheter were then navigated to the carotid siphon for better angiogram of the cerebral vasculature. Angiogram was performed. Microcatheter, a number aspiration catheter were withdrawn. The balloon guide catheter was positioned in the common carotid artery for final angiogram. All catheter and wires were removed. The sheath at the right common femoral artery access was withdrawn for an  angiogram. Eight French Angio-Seal was then deployed for hemostasis. A flat panel CT was then performed.  Patient was then extubated. Estimated blood loss was 50 cc No complications encountered. IMPRESSION: Status post left-sided cerebral angiogram with treatment of left ICA/MCA tandem occlusion, requiring acute left carotid stenting and mechanical thrombectomy of a left M1 occlusion with achievement of mTICI-3 flow, and restoration of cervical ICA flow. Flat panel CT in the room demonstrates small contrast stain without complicating features, and the patient was extubated. Status post deployment of 8 French Angio-Seal for right common femoral artery hemostasis. Status post ultrasound-guided left common femoral artery access with a 4 French sheath for arterial monitoring, which remained after the case. Status post ultrasound-guided right common femoral vein access for additional IV access, which remained after the case transmitting the Integrilin drip. Signed, Dulcy Fanny. Earleen Newport, DO [facility] Radiology PLAN: Patient will go to the PACU ICU admission Bilateral hips will be straight until the left 4 French arterial monitoring sheath is removed and the right common femoral vein venous access sheath is removed. Routine wound management for the right common femoral artery sheath access. Frequent neurovascular  checks. IV saline hydration for 8 hours given the renal insufficiency Blood pressure control management of 120-140 with nicardipine drip ordered The goal will be dual anti-platelet therapy, for now with dose of 650 mg aspirin and the Integrilin drip. Electronically Signed   By: Corrie Mckusick D.O.   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 The antibiotic was administered within 1 hour of the procedure 75 MCG NITROGLYCERIN INTRA ARTERIAL 650 mg aspirin via orogastric tube. Intra procedural initiation of Integrilin IV drip for dual anti-platelet therapy. ANESTHESIA/SEDATION: General endotracheal tube anesthesia with anesthesia team CONTRAST:  144 cc IV contrast FLUOROSCOPY TIME:  Fluoroscopy Time: 54 minutes 6 seconds (2,926 mGy). COMPLICATIONS: None TECHNIQUE: Informed written consent was obtained from the patient's family after a thorough discussion of the procedural risks, benefits and alternatives. Specific risks discussed include: Bleeding, infection, contrast reaction, kidney injury/failure, need for further procedure/surgery, arterial injury or dissection,  embolization to new territory, intracranial hemorrhage (10-15% risk), neurologic deterioration, cardiopulmonary collapse, death. All questions were addressed. Maximal Sterile Barrier Technique was utilized including during the procedure including caps, mask, sterile gowns, sterile gloves, sterile drape, hand hygiene and skin antiseptic. A timeout was performed prior to the initiation of the procedure. FINDINGS: Initial angiogram: Left common carotid artery:  Normal course caliber and contour. Left external carotid artery: Patent with antegrade flow. On the initial injection before treatment, there is minimal opacification of the carotid siphon secondary to meningeal branches which appear to be region ating from ascending pharyngeal distribution and potentially deep meningeal branches from the external carotid artery. No significant filling of the  MCA on the initial injection The initial injection does confirmed perfusion of the lenticulostriate vasculature, as well as a patent anterior coronal artery, with minimal filling of temporal lobe. Anterior basal ganglia structures are perfused. Left internal carotid artery: Internal carotid artery is occluded at the origin with a string sign at the posterior aspect of the artery. Left MCA: Initial injection demonstrates no significant filling of the middle cerebral artery. 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 Left ACA: Anterior cerebral artery perfused by the right-sided flow at the initiation. Final angiogram: After treatment of the carotid occlusion and deployment of 8 mm-6 mm x 40 mm carotid stent, there is excellent flow through the cervical carotid to the intracranial carotid. After mechanical thrombectomy of M1 occlusion, there is modified treatment in cerebral ischemia of 3. Flat panel CT: No complications with small contrast stain of the left basal ganglia. PROCEDURE: Patient was brought to the  neurointerventional suite, with the patient identity confirmed, and consent form confirmed. General anesthesia was present for initiation of general endotracheal tube anesthesia. Patient was prepped and draped in the usual sterile fashion. The only venous access was a port catheter with single needle access. Ultrasound survey of the right inguinal region was performed with images stored and sent to PACs. 11 blade scalpel was used to make a small incision. A micropuncture needle was used access the right common femoral artery under ultrasound. With excellent arterial blood flow returned, an .018 micro wire was passed through the needle, observed to enter the abdominal aorta under fluoroscopy. The needle was removed, and a micropuncture sheath was placed over the wire. The inner dilator and wire were removed, and an 035 Bentson wire was advanced under fluoroscopy into the abdominal aorta. The sheath was removed and a standard 5 [place] vascular sheath was placed. The dilator was removed and the sheath was flushed. After the initial common femoral artery access, ultrasound survey of the left inguinal region was performed in order to place arterial monitoring line. Images stored and sent to PACs. Micropuncture needle was used access the left common femoral artery under ultrasound. With excellent arterial blood flow returned, an .018 micro wire was passed through the needle, observed to enter the abdominal aorta under fluoroscopy. The needle was removed, and a micropuncture sheath was placed over the wire. The inner dilator and wire were removed, and an 035 Bentson wire was advanced under fluoroscopy into the abdominal aorta. The sheath was removed and a standard 4 [place] vascular sheath was placed. The dilator was removed and the sheath was flushed. A 20F JB-1 diagnostic catheter was advanced over the wire through the right common femoral artery access to the proximal descending thoracic aorta. Wire was then removed.  Double flush of the catheter was performed. Catheter was then used to select the left common carotid artery. Angiogram was performed. Using roadmap technique, the catheter was advanced over a roadrunner wire into right common carotid artery. Formal angiogram was performed. Catheter was advanced over the roadrunner wire into the external carotid artery. Wire was removed. Exchange length Rosen wire was then passed through the diagnostic catheter to the distal common carotid artery and the diagnostic catheter was removed. The 5 French sheath was removed and exchanged for 8 French 55 centimeter BrightTip sheath. Sheath was flushed and attached to pressurized and heparinized saline bag for constant forward flow. Then an 8 [place], 95 cm Flowgate balloon tip catheter was prepared on the back table  with inflation of the balloon with 50/50 concentration of dilute contrast. The balloon catheter was then advanced over the wire, positioned into the distal common carotid artery. Copious back flush was performed and the balloon catheter was attached to heparinized and pressurized saline bag for forward flow. Roadmap was then performed at the carotid bifurcation. Combination of a rapid transit microwire and a synchro soft wire were used to navigate beyond the occlusion at the proximal internal carotid artery. The microcatheter was then navigated into the distal cervical segment, wire was removed, blood was aspirated, and a gentle injection confirmed a luminal position. Exchange length Transcend wire was then placed through the microcatheter into the cervical carotid. Microcatheter was removed. Balloon angioplasty was then performed with a rapid exchange system, using a Viatrac balloon 5 mm x 30 mm. The patient required treatment at this time with atropine dose, as we experienced bradycardia into the 30s. His heart rate recovered after treatment by the anesthesia team. Balloon was removed. Angiogram confirmed flow into the cervical  carotid. A coaxial system of an Ace 64 penumbra aspiration catheter and the Trevo pro view 18 catheter was then advanced over the exchange length wire into the cervical carotid. Once the coaxial system was in the cervical carotid, the microwire was removed and exchanged for synchro soft. The synchro soft wire was navigated beyond the occlusion under roadmap guidance, and the aspiration catheter was advanced into the ICA terminus. The balloon guide catheter was then advanced into the mid segment of the cervical carotid artery. The microcatheter and microwire were then carefully advanced through the occluded segment. Microcatheter was then push through the occluded segment and the wire was removed. Blood was then aspirated through the hub of the microcatheter, and a gentle contrast injection was performed confirming intraluminal position. A rotating hemostatic valve was then attached to the back end of the microcatheter, and a pressurized and heparinized saline bag was attached to the catheter. 4 x 40 solitaire device was then selected. Back flush was achieved at the rotating hemostatic valve, and then the device was gently advanced through the microcatheter to the distal end. The retriever was then unsheathed by withdrawing the microcatheter under fluoroscopy. A 5 minutes interval was observed. At this time, ultrasound-guided access of the right common femoral vein was performed for placement of a additional venous access and placement of a 12 French triple-lumen IV catheter. We then proceeded with the mechanical thrombectomy. The balloon at the balloon tip catheter was then inflated under fluoroscopy for proximal flow arrest. Constant aspiration was then performed at the penumbra aspiration catheter at the carotid terminus as the retriever was gently and slowly withdrawn with fluoroscopic observation. Once the retriever was entirely removed from the system, free aspiration was confirmed at the hub of the aspiration  catheter, with free blood return confirmed. The balloon was then deflated, rotating hemostatic valve was reattached, and a control angiogram was performed, confirming restoration of flow. Penumbra catheter was then withdrawn, and the angiogram was performed at the carotid bifurcation. Given the appearance, we elected to place acute carotid stent. The lesion was then again navigated with synchro soft wire and rapid transit. With the microwire microcatheter positioned in the distal cervical carotid, the microwire was removed. The exchange length Transcend wire was then replaced and the microcatheter was removed. Acute carotid stent was then deployed, 8 mm-6 mm x 40 mm length. Before deployment, we initiated Integrilin drip treatment for dual anti-platelet therapy. Angiogram was performed. Coaxial system of the penumbra  aspiration catheter and the pro view 18 catheter were then navigated to the carotid siphon for better angiogram of the cerebral vasculature. Angiogram was performed. Microcatheter, a number aspiration catheter were withdrawn. The balloon guide catheter was positioned in the common carotid artery for final angiogram. All catheter and wires were removed. The sheath at the right common femoral artery access was withdrawn for an angiogram. Eight French Angio-Seal was then deployed for hemostasis. A flat panel CT was then performed.  Patient was then extubated. Estimated blood loss was 50 cc No complications encountered. IMPRESSION: Status post left-sided cerebral angiogram with treatment of left ICA/MCA tandem occlusion, requiring acute left carotid stenting and mechanical thrombectomy of a left M1 occlusion with achievement of mTICI-3 flow, and restoration of cervical ICA flow. Flat panel CT in the room demonstrates small contrast stain without complicating features, and the patient was extubated. Status post deployment of 8 French Angio-Seal for right common femoral artery hemostasis. Status post  ultrasound-guided left common femoral artery access with a 4 French sheath for arterial monitoring, which remained after the case. Status post ultrasound-guided right common femoral vein access for additional IV access, which remained after the case transmitting the Integrilin drip. Signed, Dulcy Fanny. Earleen Newport, DO [facility] Radiology PLAN: Patient will go to the PACU ICU admission Bilateral hips will be straight until the left 4 French arterial monitoring sheath is removed and the right common femoral vein venous access sheath is removed. Routine wound management for the right common femoral artery sheath access. Frequent neurovascular checks. IV saline hydration for 8 hours given the renal insufficiency Blood pressure control management of 120-140 with nicardipine drip ordered The goal will be dual anti-platelet therapy, for now with dose of 650 mg aspirin and the Integrilin drip. Electronically Signed   By: Corrie Mckusick D.O.   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 The antibiotic was administered within 1 hour of  the procedure 75 MCG NITROGLYCERIN INTRA ARTERIAL 650 mg aspirin via orogastric tube. Intra procedural initiation of Integrilin IV drip for dual anti-platelet therapy. ANESTHESIA/SEDATION: General endotracheal tube anesthesia with anesthesia team CONTRAST:  144 cc IV contrast FLUOROSCOPY TIME:  Fluoroscopy Time: 54 minutes 6 seconds (2,926 mGy). COMPLICATIONS: None TECHNIQUE: Informed written consent was obtained from the patient's family after a thorough discussion of the procedural risks, benefits and alternatives. Specific risks discussed include: Bleeding, infection, contrast reaction, kidney injury/failure, need for further procedure/surgery, arterial injury or dissection, embolization to new territory, intracranial hemorrhage (10-15% risk), neurologic deterioration, cardiopulmonary collapse, death. All questions were addressed. Maximal Sterile Barrier Technique was utilized including during the procedure including caps, mask, sterile gowns, sterile gloves, sterile drape, hand hygiene and skin antiseptic. A timeout was performed prior to the initiation of the  procedure. FINDINGS: Initial angiogram: Left common carotid artery:  Normal course caliber and contour. Left external carotid artery: Patent with antegrade flow. On the initial injection before treatment, there is minimal opacification of the carotid siphon secondary to meningeal branches which appear to be region ating from ascending pharyngeal distribution and potentially deep meningeal branches from the external carotid artery. No significant filling of the MCA on the initial injection The initial injection does confirmed perfusion of the lenticulostriate vasculature, as well as a patent anterior coronal artery, with minimal filling of temporal lobe. Anterior basal ganglia structures are perfused. Left internal carotid artery: Internal carotid artery is occluded at the origin with a string sign at the posterior aspect of the artery. Left MCA: Initial  injection demonstrates no significant filling of the middle cerebral artery. 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 Left ACA: Anterior cerebral artery perfused by the right-sided flow at the initiation. Final angiogram: After treatment of the carotid occlusion and deployment of 8 mm-6 mm x 40 mm carotid stent, there is excellent flow through the cervical carotid to the intracranial carotid. After mechanical thrombectomy of M1 occlusion, there is modified treatment in cerebral ischemia of 3. Flat panel CT: No complications with small contrast stain of the left basal ganglia. PROCEDURE: Patient was brought to the neurointerventional suite, with the patient identity confirmed, and consent form confirmed. General anesthesia was present for initiation of general endotracheal tube anesthesia. Patient was prepped and draped in the usual sterile fashion. The only venous access was a port catheter with single needle access. Ultrasound survey of the right inguinal region was performed with images stored and sent to PACs. 11 blade scalpel was used to make a small incision. A micropuncture needle was used access the right common femoral artery under ultrasound. With excellent arterial blood flow returned, an .018 micro wire was passed through the needle, observed to enter the abdominal aorta under fluoroscopy. The needle was removed, and a micropuncture sheath was placed over the wire. The inner dilator and wire were removed, and an 035 Bentson wire was advanced under fluoroscopy into the abdominal aorta. The sheath was removed and a standard 5 [place] vascular sheath was placed. The dilator was removed and the sheath was flushed. After the initial common femoral artery access, ultrasound survey of the left inguinal region was performed in order to place arterial monitoring line. Images stored and sent to PACs. Micropuncture needle was used access the left common  femoral artery under ultrasound. With excellent arterial blood flow returned, an .018 micro wire was passed through the needle, observed to enter the abdominal aorta under fluoroscopy. The needle was removed, and a micropuncture sheath was placed over the wire. The inner dilator and wire were removed, and an 035 Bentson wire was advanced under fluoroscopy into the abdominal aorta. The sheath was removed and a standard 4 [place] vascular sheath was placed. The dilator was removed and the sheath was flushed. A 40F JB-1 diagnostic catheter was advanced over the wire through the right common femoral artery access to the proximal descending thoracic aorta. Wire was then removed. Double flush of the catheter was performed. Catheter was then used to select the left common carotid artery. Angiogram was performed. Using roadmap technique, the catheter was advanced over a roadrunner wire into right common carotid artery. Formal angiogram was performed. Catheter was advanced over the roadrunner wire into the external carotid artery. Wire was removed. Exchange length Rosen wire  was then passed through the diagnostic catheter to the distal common carotid artery and the diagnostic catheter was removed. The 5 French sheath was removed and exchanged for 8 French 55 centimeter BrightTip sheath. Sheath was flushed and attached to pressurized and heparinized saline bag for constant forward flow. Then an 8 [place], 95 cm Flowgate balloon tip catheter was prepared on the back table with inflation of the balloon with 50/50 concentration of dilute contrast. The balloon catheter was then advanced over the wire, positioned into the distal common carotid artery. Copious back flush was performed and the balloon catheter was attached to heparinized and pressurized saline bag for forward flow. Roadmap was then performed at the carotid bifurcation. Combination of a rapid transit microwire and a synchro soft wire were used to navigate beyond the  occlusion at the proximal internal carotid artery. The microcatheter was then navigated into the distal cervical segment, wire was removed, blood was aspirated, and a gentle injection confirmed a luminal position. Exchange length Transcend wire was then placed through the microcatheter into the cervical carotid. Microcatheter was removed. Balloon angioplasty was then performed with a rapid exchange system, using a Viatrac balloon 5 mm x 30 mm. The patient required treatment at this time with atropine dose, as we experienced bradycardia into the 30s. His heart rate recovered after treatment by the anesthesia team. Balloon was removed. Angiogram confirmed flow into the cervical carotid. A coaxial system of an Ace 64 penumbra aspiration catheter and the Trevo pro view 18 catheter was then advanced over the exchange length wire into the cervical carotid. Once the coaxial system was in the cervical carotid, the microwire was removed and exchanged for synchro soft. The synchro soft wire was navigated beyond the occlusion under roadmap guidance, and the aspiration catheter was advanced into the ICA terminus. The balloon guide catheter was then advanced into the mid segment of the cervical carotid artery. The microcatheter and microwire were then carefully advanced through the occluded segment. Microcatheter was then push through the occluded segment and the wire was removed. Blood was then aspirated through the hub of the microcatheter, and a gentle contrast injection was performed confirming intraluminal position. A rotating hemostatic valve was then attached to the back end of the microcatheter, and a pressurized and heparinized saline bag was attached to the catheter. 4 x 40 solitaire device was then selected. Back flush was achieved at the rotating hemostatic valve, and then the device was gently advanced through the microcatheter to the distal end. The retriever was then unsheathed by withdrawing the microcatheter  under fluoroscopy. A 5 minutes interval was observed. At this time, ultrasound-guided access of the right common femoral vein was performed for placement of a additional venous access and placement of a 12 French triple-lumen IV catheter. We then proceeded with the mechanical thrombectomy. The balloon at the balloon tip catheter was then inflated under fluoroscopy for proximal flow arrest. Constant aspiration was then performed at the penumbra aspiration catheter at the carotid terminus as the retriever was gently and slowly withdrawn with fluoroscopic observation. Once the retriever was entirely removed from the system, free aspiration was confirmed at the hub of the aspiration catheter, with free blood return confirmed. The balloon was then deflated, rotating hemostatic valve was reattached, and a control angiogram was performed, confirming restoration of flow. Penumbra catheter was then withdrawn, and the angiogram was performed at the carotid bifurcation. Given the appearance, we elected to place acute carotid stent. The lesion was then again navigated with  synchro soft wire and rapid transit. With the microwire microcatheter positioned in the distal cervical carotid, the microwire was removed. The exchange length Transcend wire was then replaced and the microcatheter was removed. Acute carotid stent was then deployed, 8 mm-6 mm x 40 mm length. Before deployment, we initiated Integrilin drip treatment for dual anti-platelet therapy. Angiogram was performed. Coaxial system of the penumbra aspiration catheter and the pro view 18 catheter were then navigated to the carotid siphon for better angiogram of the cerebral vasculature. Angiogram was performed. Microcatheter, a number aspiration catheter were withdrawn. The balloon guide catheter was positioned in the common carotid artery for final angiogram. All catheter and wires were removed. The sheath at the right common femoral artery access was withdrawn for an  angiogram. Eight French Angio-Seal was then deployed for hemostasis. A flat panel CT was then performed.  Patient was then extubated. Estimated blood loss was 50 cc No complications encountered. IMPRESSION: Status post left-sided cerebral angiogram with treatment of left ICA/MCA tandem occlusion, requiring acute left carotid stenting and mechanical thrombectomy of a left M1 occlusion with achievement of mTICI-3 flow, and restoration of cervical ICA flow. Flat panel CT in the room demonstrates small contrast stain without complicating features, and the patient was extubated. Status post deployment of 8 French Angio-Seal for right common femoral artery hemostasis. Status post ultrasound-guided left common femoral artery access with a 4 French sheath for arterial monitoring, which remained after the case. Status post ultrasound-guided right common femoral vein access for additional IV access, which remained after the case transmitting the Integrilin drip. Signed, Dulcy Fanny. Earleen Newport, DO [facility] Radiology PLAN: Patient will go to the PACU ICU admission Bilateral hips will be straight until the left 4 French arterial monitoring sheath is removed and the right common femoral vein venous access sheath is removed. Routine wound management for the right common femoral artery sheath access. Frequent neurovascular checks. IV saline hydration for 8 hours given the renal insufficiency Blood pressure control management of 120-140 with nicardipine drip ordered The goal will be dual anti-platelet therapy, for now with dose of 650 mg aspirin and the Integrilin drip. Electronically Signed   By: Corrie Mckusick D.O.   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 The antibiotic was administered within 1 hour of the procedure 75 MCG NITROGLYCERIN INTRA ARTERIAL 650 mg aspirin via orogastric tube. Intra procedural initiation of Integrilin IV drip for dual anti-platelet therapy. ANESTHESIA/SEDATION: General endotracheal tube anesthesia with anesthesia team CONTRAST:  144 cc IV contrast FLUOROSCOPY TIME:  Fluoroscopy Time: 54 minutes 6 seconds (2,926 mGy). COMPLICATIONS: None TECHNIQUE: Informed written consent was obtained from the patient's family after a thorough discussion of the procedural risks, benefits and alternatives. Specific risks  discussed include: Bleeding, infection, contrast reaction, kidney injury/failure, need for further procedure/surgery, arterial injury or dissection, embolization to new territory, intracranial hemorrhage (10-15% risk), neurologic deterioration, cardiopulmonary collapse, death. All questions were addressed. Maximal Sterile Barrier Technique was utilized including during the procedure including caps, mask, sterile gowns, sterile gloves, sterile drape, hand hygiene and skin antiseptic. A timeout was performed prior to the initiation of the procedure. FINDINGS: Initial angiogram: Left common carotid artery:  Normal course caliber and contour. Left external carotid artery: Patent with  antegrade flow. On the initial injection before treatment, there is minimal opacification of the carotid siphon secondary to meningeal branches which appear to be region ating from ascending pharyngeal distribution and potentially deep meningeal branches from the external carotid artery. No significant filling of the MCA on the initial injection The initial injection does confirmed perfusion of the lenticulostriate vasculature, as well as a patent anterior coronal artery, with minimal filling of temporal lobe. Anterior basal ganglia structures are perfused. Left internal carotid artery: Internal carotid artery is occluded at the origin with a string sign at the posterior aspect of the artery. Left MCA: Initial injection demonstrates no significant filling of the middle cerebral artery. 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 Left ACA: Anterior cerebral artery perfused by the right-sided flow at the initiation. Final angiogram: After treatment of the carotid occlusion and deployment of 8 mm-6 mm x 40 mm carotid stent, there is excellent flow through the cervical carotid to the intracranial carotid. After mechanical thrombectomy of M1 occlusion, there is modified treatment in cerebral ischemia of 3. Flat panel CT: No complications with small contrast stain of the left basal ganglia. PROCEDURE: Patient was brought to the neurointerventional suite, with the patient identity confirmed, and consent form confirmed. General anesthesia was present for initiation of general endotracheal tube anesthesia. Patient was prepped and draped in the usual sterile fashion. The only venous access was a port catheter with single needle access. Ultrasound survey of the right inguinal region was performed with images stored and sent to PACs. 11 blade scalpel was used to make a small incision. A micropuncture needle was used access the right common femoral artery under  ultrasound. With excellent arterial blood flow returned, an .018 micro wire was passed through the needle, observed to enter the abdominal aorta under fluoroscopy. The needle was removed, and a micropuncture sheath was placed over the wire. The inner dilator and wire were removed, and an 035 Bentson wire was advanced under fluoroscopy into the abdominal aorta. The sheath was removed and a standard 5 [place] vascular sheath was placed. The dilator was removed and the sheath was flushed. After the initial common femoral artery access, ultrasound survey of the left inguinal region was performed in order to place arterial monitoring line. Images stored and sent to PACs. Micropuncture needle was used access the left common femoral artery under ultrasound. With excellent arterial blood flow returned, an .018 micro wire was passed through the needle, observed to enter the abdominal aorta under fluoroscopy. The needle was removed, and a micropuncture sheath was placed over the wire. The inner dilator and wire were removed, and an 035 Bentson wire was advanced under fluoroscopy into the abdominal aorta. The sheath was removed and a standard 4 [place] vascular sheath was placed. The dilator was removed and the sheath was flushed. A 80F JB-1 diagnostic catheter was advanced over the wire through the right common femoral artery access to the proximal  descending thoracic aorta. Wire was then removed. Double flush of the catheter was performed. Catheter was then used to select the left common carotid artery. Angiogram was performed. Using roadmap technique, the catheter was advanced over a roadrunner wire into right common carotid artery. Formal angiogram was performed. Catheter was advanced over the roadrunner wire into the external carotid artery. Wire was removed. Exchange length Rosen wire was then passed through the diagnostic catheter to the distal common carotid artery and the diagnostic catheter was removed. The 5 French  sheath was removed and exchanged for 8 French 55 centimeter BrightTip sheath. Sheath was flushed and attached to pressurized and heparinized saline bag for constant forward flow. Then an 8 [place], 95 cm Flowgate balloon tip catheter was prepared on the back table with inflation of the balloon with 50/50 concentration of dilute contrast. The balloon catheter was then advanced over the wire, positioned into the distal common carotid artery. Copious back flush was performed and the balloon catheter was attached to heparinized and pressurized saline bag for forward flow. Roadmap was then performed at the carotid bifurcation. Combination of a rapid transit microwire and a synchro soft wire were used to navigate beyond the occlusion at the proximal internal carotid artery. The microcatheter was then navigated into the distal cervical segment, wire was removed, blood was aspirated, and a gentle injection confirmed a luminal position. Exchange length Transcend wire was then placed through the microcatheter into the cervical carotid. Microcatheter was removed. Balloon angioplasty was then performed with a rapid exchange system, using a Viatrac balloon 5 mm x 30 mm. The patient required treatment at this time with atropine dose, as we experienced bradycardia into the 30s. His heart rate recovered after treatment by the anesthesia team. Balloon was removed. Angiogram confirmed flow into the cervical carotid. A coaxial system of an Ace 64 penumbra aspiration catheter and the Trevo pro view 18 catheter was then advanced over the exchange length wire into the cervical carotid. Once the coaxial system was in the cervical carotid, the microwire was removed and exchanged for synchro soft. The synchro soft wire was navigated beyond the occlusion under roadmap guidance, and the aspiration catheter was advanced into the ICA terminus. The balloon guide catheter was then advanced into the mid segment of the cervical carotid artery. The  microcatheter and microwire were then carefully advanced through the occluded segment. Microcatheter was then push through the occluded segment and the wire was removed. Blood was then aspirated through the hub of the microcatheter, and a gentle contrast injection was performed confirming intraluminal position. A rotating hemostatic valve was then attached to the back end of the microcatheter, and a pressurized and heparinized saline bag was attached to the catheter. 4 x 40 solitaire device was then selected. Back flush was achieved at the rotating hemostatic valve, and then the device was gently advanced through the microcatheter to the distal end. The retriever was then unsheathed by withdrawing the microcatheter under fluoroscopy. A 5 minutes interval was observed. At this time, ultrasound-guided access of the right common femoral vein was performed for placement of a additional venous access and placement of a 12 French triple-lumen IV catheter. We then proceeded with the mechanical thrombectomy. The balloon at the balloon tip catheter was then inflated under fluoroscopy for proximal flow arrest. Constant aspiration was then performed at the penumbra aspiration catheter at the carotid terminus as the retriever was gently and slowly withdrawn with fluoroscopic observation. Once the retriever was entirely removed from the system,  free aspiration was confirmed at the hub of the aspiration catheter, with free blood return confirmed. The balloon was then deflated, rotating hemostatic valve was reattached, and a control angiogram was performed, confirming restoration of flow. Penumbra catheter was then withdrawn, and the angiogram was performed at the carotid bifurcation. Given the appearance, we elected to place acute carotid stent. The lesion was then again navigated with synchro soft wire and rapid transit. With the microwire microcatheter positioned in the distal cervical carotid, the microwire was removed. The  exchange length Transcend wire was then replaced and the microcatheter was removed. Acute carotid stent was then deployed, 8 mm-6 mm x 40 mm length. Before deployment, we initiated Integrilin drip treatment for dual anti-platelet therapy. Angiogram was performed. Coaxial system of the penumbra aspiration catheter and the pro view 18 catheter were then navigated to the carotid siphon for better angiogram of the cerebral vasculature. Angiogram was performed. Microcatheter, a number aspiration catheter were withdrawn. The balloon guide catheter was positioned in the common carotid artery for final angiogram. All catheter and wires were removed. The sheath at the right common femoral artery access was withdrawn for an angiogram. Eight French Angio-Seal was then deployed for hemostasis. A flat panel CT was then performed.  Patient was then extubated. Estimated blood loss was 50 cc No complications encountered. IMPRESSION: Status post left-sided cerebral angiogram with treatment of left ICA/MCA tandem occlusion, requiring acute left carotid stenting and mechanical thrombectomy of a left M1 occlusion with achievement of mTICI-3 flow, and restoration of cervical ICA flow. Flat panel CT in the room demonstrates small contrast stain without complicating features, and the patient was extubated. Status post deployment of 8 French Angio-Seal for right common femoral artery hemostasis. Status post ultrasound-guided left common femoral artery access with a 4 French sheath for arterial monitoring, which remained after the case. Status post ultrasound-guided right common femoral vein access for additional IV access, which remained after the case transmitting the Integrilin drip. Signed, Dulcy Fanny. Earleen Newport, DO [facility] PLAN: Patient will go to the PACU ICU admission Bilateral hips will be straight until the left 4 French arterial monitoring sheath is removed and the right  common femoral vein venous access sheath is removed. Routine wound management for the right common femoral artery sheath access. Frequent neurovascular checks. IV saline hydration for 8 hours given the renal insufficiency Blood pressure control management of 120-140 with nicardipine drip ordered The goal will be dual anti-platelet therapy, for now with dose of 650 mg aspirin and the Integrilin drip. Electronically Signed   By: Corrie Mckusick D.O.   On: 05/06/2017 09:24   Ct Head Code Stroke Wo Contrast  Result Date: 05/06/2017 CLINICAL DATA:  Code stroke. 77 y/o M; right-sided weakness and numbness with slurred speech. EXAM: CT HEAD WITHOUT CONTRAST TECHNIQUE: Contiguous axial images were obtained from the base of the skull through the vertex without intravenous contrast. COMPARISON:  11/19/2016 MRI of the head. FINDINGS: Brain: No evidence of acute infarction, hemorrhage, hydrocephalus, extra-axial collection or mass lesion/mass effect. Stable moderate chronic microvascular ischemic changes and parenchymal volume loss of the brain. Vascular: Calcific atherosclerosis of carotid siphons. Dense left M1 (series 5, image 30). Skull: Normal. Negative for fracture or focal lesion. Sinuses/Orbits: No acute finding. Other: None. ASPECTS [facility] Stroke Program Early CT Score) - Ganglionic level infarction (caudate, lentiform nuclei, internal capsule, insula, M1-M3 cortex): 7 - Supraganglionic infarction (M4-M6 cortex): 3 Total score (0-10 with 10 being normal): 10 IMPRESSION:  1. No acute stroke, hemorrhage, or mass effect identified. 2. Dense left M1, possible thrombus. 3. ASPECTS is 10 4. Stable chronic microvascular ischemic changes and parenchymal volume loss of the brain given differences in technique. These results were communicated to Dr. Cheral Marker at 3:41 amon 2/28/2019by text page via the [facility] messaging system. Electronically Signed   By: Kristine Garbe M.D.   On: 05/06/2017 03:43   MRI and MRA  pending  TTE pending   PHYSICAL EXAM  Temp:  [97.4 F (36.3 C)-98.7 F (37.1 C)] 97.7 F (36.5 C) (02/28 1042) Pulse Rate:  [51-76] 51 (02/28 1100) Resp:  [10-18] 14 (02/28 1100) BP: (95-161)/(55-87) 98/55 (02/28 1100) SpO2:  [98 %-100 %] 98 % (02/28 1100) Arterial Line BP: (111-135)/(48-61) 111/48 (02/28 1100) Weight:  [210 lb 12.2 oz (95.6 kg)-212 lb 1.3 oz (96.2 kg)] 212 lb 1.3 oz (96.2 kg) (02/28 0930)  General - Well nourished, well developed, sleepy but arousable.  Ophthalmologic - fundi not visualized due to noncooperation.  Cardiovascular - Regular rate and rhythm.  Neuro - sleepy but arousable, open eyes on voice. Follows simple commands, paucity of speech, severe dysarthria, able to repeat short words and naming 2/4. Eyes left gaze preference but cross midline easily, right neglect, not blinking to visual threat on the right. PERRL, right facial droop, tongue midline. RUE 4/5 with drift, BLE 4/5, LUE 5/5. DTR 1+ and no babinski. Sensation symmetrical, coordination intact bilaterally, gait deferred.    ASSESSMENT/PLAN [name] is a 77 y.o. 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 No tPA given due to concern of brain metastasis.    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 cardioembolic source  Resultant left gaze preference, right facial droop and right UE mild weakness  MRI  pending  MRA  pending  CTA head and neck - left ICA and left M1 occlusion  DSA - TICI3 reperfusion, left ICA stenting  2D Echo  pending  LDL pending  HgbA1c pending  subq heparin for VTE prophylaxis  Fall precautions  Diet NPO time specified   aspirin 81 mg daily prior to admission, now on aspirin 325 mg  daily and integrelin. Will continue integrelin for total 24 hours and then switch to plavix with loading dose  Ongoing aggressive stroke risk factor management  Therapy recommendations:  Pending   Disposition:  Pending  Small cell lung cancer   11/2016 brain MRI no brain metastasis  On chemo and radiation  Follows with [facility]  Await oncology opinion regarding possibility of hypercoagulable state  If no concern of hypercoagulable state, he needs further cardiac work up  Diabetes  HgbA1c pending goal < 7.0  Hyperglycemia  On pre-meal insulin now  Resume home po meds  CBG monitoring  SSI  DM coordinator consult   Hypertension but no hypotension BP at low end BP goal 120-140 post procedure  Long term BP goal normotensive  On albumin  On IVF  On neo  Hyperlipidemia  Home meds:  none   LDL pending, goal < 70  Statin allergy listed in file  CKD  Cre 1.98->1.80  At baseline   Continue IVF  Other Stroke Risk Factors  Advanced age  Former smoker - quit 13 years ago  Other Active Problems    Hospital day # 0  This patient is critically ill due to 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 This patient's care requires constant monitoring of vital signs, hemodynamics, respiratory and cardiac monitoring, review of multiple databases, neurological assessment, discussion with family, other specialists and medical decision making of high complexity. I spent 45 minutes of neurocritical care time in the care of this patient.   Rosalin Hawking, MD PhD Stroke Neurology 05/06/2017 11:53 AM    To contact Stroke Continuity provider, please refer to [email or web address] After hours, contact General Neurology

## 2017-05-06 NOTE — Transfer of Care (Signed)
Immediate Anesthesia Transfer of Care Note  Patient: Rodman Recupero  Procedure(s) Performed: IR WITH ANESTHESIA (N/A )  Patient Location: PACU  Anesthesia Type:General  Level of Consciousness: drowsy and patient cooperative  Airway & Oxygen Therapy: Patient Spontanous Breathing and Patient connected to nasal cannula oxygen  Post-op Assessment: Report given to RN, Post -op Vital signs reviewed and stable and moving all extremities, following commands on left side  Post vital signs: Reviewed and stable  Last Vitals:  Vitals:   05/06/17 0737 05/06/17 0745  BP: (P) 118/77   Pulse: (P) 60 (!) 59  Resp: (P) 13 13  Temp: (!) 36.3 C   SpO2: (P) 100% 100%    Last Pain:  Vitals:   05/06/17 0412  TempSrc: Temporal         Complications: No apparent anesthesia complications

## 2017-05-06 NOTE — ED Notes (Signed)
Dr Cheral Marker called and spoke with wife , decision was made to take patient to IR>

## 2017-05-06 NOTE — CV Procedure (Signed)
Attempted 2D Echo twice, will try again at a later time.  Edwin Jones

## 2017-05-06 NOTE — Anesthesia Procedure Notes (Signed)
Procedure Name: Intubation Date/Time: 05/06/2017 5:00 AM Performed by: Clovis Cao, CRNA Pre-anesthesia Checklist: Patient identified, Emergency Drugs available, Suction available, Patient being monitored and Timeout performed Patient Re-evaluated:Patient Re-evaluated prior to induction Oxygen Delivery Method: Circle system utilized Preoxygenation: Pre-oxygenation with 100% oxygen Induction Type: IV induction, Rapid sequence and Cricoid Pressure applied Laryngoscope Size: Miller and 2 Grade View: Grade I Tube type: Subglottic suction tube Tube size: 7.5 mm Number of attempts: 1 Airway Equipment and Method: Stylet Placement Confirmation: ETT inserted through vocal cords under direct vision,  positive ETCO2 and breath sounds checked- equal and bilateral Secured at: 23 cm Tube secured with: Tape Dental Injury: Teeth and Oropharynx as per pre-operative assessment

## 2017-05-06 NOTE — Progress Notes (Signed)
Hypoglycemic Event  CBG: 59  Treatment: D50 IV 25 mL  Symptoms: None  Follow-up CBG: Time: 1805       CBG Result: 87  Possible Reasons for Event: Unknown     Hocutt, Rande Brunt

## 2017-05-06 NOTE — ED Provider Notes (Signed)
[facility] EMERGENCY DEPARTMENT Provider Note   CSN: [id number] Arrival date & time: 05/06/17  0302     History   Chief Complaint Chief Complaint  Patient presents with  . Code Stroke    HPI Edwin Jones is a 77 y.o. male.   The history is provided by the patient and the EMS personnel. The history is limited by the condition of the patient (Aphasia).  He has history of diabetes, hypertension, hyperlipidemia, COPD, small cell lung cancer and comes in with right-sided weakness.  He was last known normal at 1 AM when he walked to go to bed.  He woke up at about 2 AM with right-sided weakness and unable to walk.  He denies any headache.  EMS transported him here as a code stroke.  Of note, he just completed prophylactic cranial radiation.  His lung cancer has been treated with chemotherapy and radiation treatment, both of which have been completed.  Past Medical History:  Diagnosis Date  . COPD (chronic obstructive pulmonary disease) ([place])   . Diabetes mellitus without complication ([place])   . GERD without esophagitis   . Hepatitis C virus    history of hep C treated with Harvoni  . Hypercholesterolemia   . Hypertension   . Seasonal allergies     Patient Active Problem List   Diagnosis Date Noted  . Cancer of upper lobe of left lung ([place]) 10/30/2016  . Pure hypercholesterolemia 06/22/2014  . GERD without esophagitis 06/22/2014  . Benign essential hypertension 06/22/2014  . Diabetes mellitus without complication ([place]) 67/61/9509    Past Surgical History:  Procedure Laterality Date  . COLONOSCOPY     approximately 11 years ago  . FLEXIBLE BRONCHOSCOPY N/A 11/05/2016   Procedure: FLEXIBLE BRONCHOSCOPY;  Surgeon: Wilhelmina Mcardle, MD;  Location: [facility] ORS;  Service: Pulmonary;  Laterality: N/A;  . PORTA CATH INSERTION N/A 11/23/2016   Procedure: Glori Luis Cath Insertion;  Surgeon: Algernon Huxley, MD;  Location: [facility];  Service: Cardiovascular;   Laterality: N/A;  . TONSILLECTOMY         Home Medications    Prior to Admission medications   Medication Sig Start Date End Date Taking? Authorizing Provider  albuterol (PROVENTIL HFA;VENTOLIN HFA) 108 (90 Base) MCG/ACT inhaler Inhale 2 puffs into the lungs every 6 (six) hours as needed for wheezing or shortness of breath. 03/25/17   Cammie Sickle, MD  APPLE CIDER VINEGAR PO Take 450 mg by mouth at bedtime.     [provider]  aspirin EC 81 MG tablet Take 81 mg by mouth at bedtime.     [provider]  cetirizine (ZYRTEC) 10 MG tablet Take 10 mg by mouth daily.    [provider]  cholecalciferol (VITAMIN D) 1000 units tablet Take 1,000 Units by mouth daily.    [provider]  Coenzyme Q10 100 MG capsule Take 100 mg by mouth daily.     [provider]  dexamethasone (DECADRON) 4 MG tablet Take 1 tablet (4 mg total) by mouth daily. Starting on the first day of radiation 04/02/17   Noreene Filbert, MD  Flaxseed, Linseed, (FLAXSEED OIL) 1000 MG CAPS Take 1,000 mg by mouth at bedtime.     [provider]  fluticasone (FLONASE) 50 MCG/ACT nasal spray Place 1 spray into both nostrils daily as needed for allergies or rhinitis.    [provider]  Fluticasone-Salmeterol (ADVAIR DISKUS) 500-50 MCG/DOSE AEPB Inhale 1 puff into the lungs  2 (two) times daily. 03/25/17   Cammie Sickle, MD  glipiZIDE (GLUCOTROL) 10 MG tablet Take 10 mg by mouth daily. Time release capsule    [provider]  glucagon (GLUCAGON EMERGENCY) 1 MG injection Inject 1 mg into the muscle once as needed. When blood glucose drops less than 50; and if patient has difficulty drinking. 12/16/16   Cammie Sickle, MD  GNP GARLIC EXTRACT PO Take 5,188 mg by mouth daily.     [provider]  hydrochlorothiazide (HYDRODIURIL) 25 MG tablet Take 25 mg by mouth daily.    [provider]  lidocaine-prilocaine (EMLA) cream Apply 1  application topically as needed. 11/24/16   Cammie Sickle, MD  linagliptin (TRADJENTA) 5 MG TABS tablet Take 5 mg by mouth daily.    [provider]  lisinopril (PRINIVIL,ZESTRIL) 10 MG tablet Take 10 mg by mouth daily.    [provider]  methocarbamol (ROBAXIN) 500 MG tablet Take 1 tablet (500 mg total) by mouth every 8 (eight) hours as needed for muscle spasms. 03/19/17   Verlon Au, NP  Multiple Vitamin (MULTIVITAMIN WITH MINERALS) TABS tablet Take 1 tablet by mouth daily.    [provider]  omeprazole (PRILOSEC) 20 MG capsule Take 20 mg by mouth every Monday, Wednesday, and Friday.  07/04/14   [provider]  ondansetron (ZOFRAN) 8 MG tablet Take 1 tablet (8 mg total) by mouth every 8 (eight) hours as needed for nausea or vomiting (start 3 days; after chemo). Patient not taking: Reported on 03/25/2017 11/22/16   Cammie Sickle, MD  prochlorperazine (COMPAZINE) 10 MG tablet Take 1 tablet (10 mg total) by mouth every 6 (six) hours as needed for nausea or vomiting. Patient not taking: Reported on 03/25/2017 11/22/16   Cammie Sickle, MD  simethicone (MYLICON) 80 MG chewable tablet Chew 160 mg by mouth once.    [provider]  ZINC-VITAMIN C MT Take 1 tablet daily by mouth.    [provider]    Family History Family History  Problem Relation Age of Onset  . Skin cancer Mother   . Ovarian cancer Sister        sister was diagnosed as a teenager  . Throat cancer Brother 37       brother #1  . Lung cancer Sister 71  . Throat cancer Brother 79       brother #2  . Skin cancer Sister     Social History Social History   Tobacco Use  . Smoking status: Former Smoker    Packs/day: 1.00    Years: 50.00    Pack years: 50.00    Types: Cigarettes    Last attempt to quit: 04/07/2004    Years since quitting: 13.0  . Smokeless tobacco: Never Used  Substance Use Topics  . Alcohol use: No    Comment: history of  alcohol use  . Drug use: No     Allergies   Penicillin g; Shellfish-derived products; Statins; Erythromycin; Tamsulosin; Tuberculin ppd; and Iodinated diagnostic agents   Review of Systems Review of Systems  All other systems reviewed and are negative.    Physical Exam Updated Vital Signs BP (!) 161/82 (BP Location: Left Arm)   Pulse 73   Temp 98.7 F (37.1 C) (Temporal)   Resp 18   Ht _0  (1.905 m)   Wt 95.6 kg (210 lb 12.2 oz)   SpO2 98%   BMI 26.34 kg/m  Physical Exam  Nursing note and vitals reviewed.  77 year old male, resting comfortably and in no acute distress. Vital signs are significant for elevated systolic blood pressure. Oxygen saturation is 98%, which is normal. Head is normocephalic and atraumatic. PERRLA, EOMI. Oropharynx is clear. Neck is nontender and supple without adenopathy or JVD.  There are no carotid bruits. Back is nontender and there is no CVA tenderness. Lungs are clear without rales, wheezes, or rhonchi. Chest is nontender.  Mediport is present on the right side. Heart has regular rate and rhythm without murmur. Abdomen is soft, flat, nontender without masses or hepatosplenomegaly and peristalsis is normoactive. Extremities have no cyanosis or edema, full range of motion is present. Skin is warm and dry without rash. Neurologic: He is awake and alert and able to answer simple questions with yes and no, but very little spontaneous speech, there is a fairly dense right-sided facial droop, right hemiparesis is present with strength in the right arm and right leg 4/5, left side strength is 5/5.  Right Babinski reflexes present..  ED Treatments / Results  Labs (all labs ordered are listed, but only abnormal results are displayed) Labs Reviewed  CBC - Abnormal; Notable for the following components:      Result Value   RBC 4.02 (*)    Hemoglobin 12.6 (*)    HCT 38.4 (*)    All other components within normal limits  I-STAT CHEM 8, ED -  Abnormal; Notable for the following components:   BUN 37 (*)    Creatinine, Ser 1.80 (*)    Glucose, Bld 366 (*)    All other components within normal limits  DIFFERENTIAL  PROTIME-INR  APTT  COMPREHENSIVE METABOLIC PANEL  I-STAT TROPONIN, ED  CBG MONITORING, ED    EKG  EKG Interpretation  Date/Time:  Thursday May 06 2017 04:00:26 EST Ventricular Rate:  70 PR Interval:    QRS Duration: 86 QT Interval:  380 QTC Calculation: 410 R Axis:   -50 Text Interpretation:  Sinus rhythm Left anterior fascicular block Abnormal R-wave progression, early transition Nonspecific ST abnormality No old tracing to compare Confirmed by Delora Fuel ([id number]) on 05/06/2017 4:09:20 AM       Radiology Ct Angio Head W Or Wo Contrast  Result Date: 05/06/2017 CLINICAL DATA:  77 y/o M; right-sided weakness, numbness, slurred speech. EXAM: CT ANGIOGRAPHY HEAD AND NECK TECHNIQUE: Multidetector CT imaging of the head and neck was performed using the standard protocol during bolus administration of intravenous contrast. Multiplanar CT image reconstructions and MIPs were obtained to evaluate the vascular anatomy. Carotid stenosis measurements (when applicable) are obtained utilizing NASCET criteria, using the distal internal carotid diameter as the denominator. CONTRAST:  80m ISOVUE-370 IOPAMIDOL (ISOVUE-370) INJECTION 76% COMPARISON:  None. FINDINGS: CTA NECK FINDINGS Aortic arch: Standard branching. Imaged portion shows no evidence of aneurysm or dissection. No significant stenosis of the major arch vessel origins. Right carotid system: No evidence of dissection, stenosis (50% or greater) or occlusion. Left carotid system: Patent left common carotid artery. Left ICA occlusion to the terminus where there is a short segment of reconstitution of the terminal ICA and proximal left M1 via the anterior communicating artery. Vertebral arteries: Codominant. No evidence of dissection, stenosis (50% or greater) or  occlusion. Skeleton: Moderate cervical spine spondylosis with multilevel discogenic degenerative changes greatest at the C4 through C7 levels or uncovertebral and facet hypertrophy encroach on neural foramen and there is mild canal stenosis. Other neck: Negative. Upper chest: Right port  catheter in the SVC extending below field of view. Review of the MIP images confirms the above findings CTA HEAD FINDINGS Anterior circulation: Left mid M1 occlusion with poor left MCA distribution collateral circulation. Right distal M1 mild stenosis. No additional large vessel occlusion, aneurysm, or vascular malformation in the anterior circulation. Posterior circulation: No significant stenosis, proximal occlusion, aneurysm, or vascular malformation. Right P2 moderate stenosis and left P2 mild stenosis. Venous sinuses: As permitted by contrast timing, patent. Anatomic variants: Patent anterior and bilateral posterior communicating arteries. Review of the MIP images confirms the above findings IMPRESSION: 1. 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 Short segment of patency of left ICA terminus and proximal left M1 via anterior communicating artery. 2. Left mid M1 occlusion with poor left MCA collateralization. 3. Patent right carotid and bilateral vertebral systems. 4. Patent bilateral ACA, right MCA, and bilateral PCA distributions. Intracranial atherosclerosis with multiple segments of mild-to-moderate stenosis. These results were called by telephone at the time of interpretation on 05/06/2017 at 4:10 am to Dr. Cheral Marker, who verbally acknowledged these results. Electronically Signed   By: Kristine Garbe M.D.   On: 05/06/2017 04:17   Ct Angio Neck W And/or Wo Contrast  Result Date: 05/06/2017 CLINICAL DATA:  77 y/o M; right-sided weakness, numbness, slurred speech. EXAM: CT ANGIOGRAPHY HEAD AND NECK TECHNIQUE: Multidetector CT imaging of the head and neck was performed using the standard protocol during bolus  administration of intravenous contrast. Multiplanar CT image reconstructions and MIPs were obtained to evaluate the vascular anatomy. Carotid stenosis measurements (when applicable) are obtained utilizing NASCET criteria, using the distal internal carotid diameter as the denominator. CONTRAST:  69m ISOVUE-370 IOPAMIDOL (ISOVUE-370) INJECTION 76% COMPARISON:  None. FINDINGS: CTA NECK FINDINGS Aortic arch: Standard branching. Imaged portion shows no evidence of aneurysm or dissection. No significant stenosis of the major arch vessel origins. Right carotid system: No evidence of dissection, stenosis (50% or greater) or occlusion. Left carotid system: Patent left common carotid artery. Left ICA occlusion to the terminus where there is a short segment of reconstitution of the terminal ICA and proximal left M1 via the anterior communicating artery. Vertebral arteries: Codominant. No evidence of dissection, stenosis (50% or greater) or occlusion. Skeleton: Moderate cervical spine spondylosis with multilevel discogenic degenerative changes greatest at the C4 through C7 levels or uncovertebral and facet hypertrophy encroach on neural foramen and there is mild canal stenosis. Other neck: Negative. Upper chest: Right port catheter in the SVC extending below field of view. Review of the MIP images confirms the above findings CTA HEAD FINDINGS Anterior circulation: Left mid M1 occlusion with poor left MCA distribution collateral circulation. Right distal M1 mild stenosis. No additional large vessel occlusion, aneurysm, or vascular malformation in the anterior circulation. Posterior circulation: No significant stenosis, proximal occlusion, aneurysm, or vascular malformation. Right P2 moderate stenosis and left P2 mild stenosis. Venous sinuses: As permitted by contrast timing, patent. Anatomic variants: Patent anterior and bilateral posterior communicating arteries. Review of the MIP images confirms the above findings IMPRESSION:  1. 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 Short segment of patency of left ICA terminus and proximal left M1 via anterior communicating artery. 2. Left mid M1 occlusion with poor left MCA collateralization. 3. Patent right carotid and bilateral vertebral systems. 4. Patent bilateral ACA, right MCA, and bilateral PCA distributions. Intracranial atherosclerosis with multiple segments of mild-to-moderate stenosis. These results were called by telephone at the time of interpretation on 05/06/2017 at 4:10 am to Dr. LCheral Marker who verbally acknowledged  these results. Electronically Signed   By: Kristine Garbe M.D.   On: 05/06/2017 04:17   Ct Head Code Stroke Wo Contrast  Result Date: 05/06/2017 CLINICAL DATA:  Code stroke. 77 y/o M; right-sided weakness and numbness with slurred speech. EXAM: CT HEAD WITHOUT CONTRAST TECHNIQUE: Contiguous axial images were obtained from the base of the skull through the vertex without intravenous contrast. COMPARISON:  11/19/2016 MRI of the head. FINDINGS: Brain: No evidence of acute infarction, hemorrhage, hydrocephalus, extra-axial collection or mass lesion/mass effect. Stable moderate chronic microvascular ischemic changes and parenchymal volume loss of the brain. Vascular: Calcific atherosclerosis of carotid siphons. Dense left M1 (series 5, image 30). Skull: Normal. Negative for fracture or focal lesion. Sinuses/Orbits: No acute finding. Other: None. ASPECTS [facility] Stroke Program Early CT Score) - Ganglionic level infarction (caudate, lentiform nuclei, internal capsule, insula, M1-M3 cortex): 7 - Supraganglionic infarction (M4-M6 cortex): 3 Total score (0-10 with 10 being normal): 10 IMPRESSION: 1. No acute stroke, hemorrhage, or mass effect identified. 2. Dense left M1, possible thrombus. 3. ASPECTS is 10 4. Stable chronic microvascular ischemic changes and parenchymal volume loss of the brain given differences in technique. These results were communicated to Dr.  Cheral Marker at 3:41 amon 2/28/2019by text page via the [facility] messaging system. Electronically Signed   By: Kristine Garbe M.D.   On: 05/06/2017 03:43    Procedures Procedures  CRITICAL CARE Performed by: Delora Fuel Total critical care time: 40 minutes Critical care time was exclusive of separately billable procedures and treating other patients. Critical care was necessary to treat or prevent imminent or life-threatening deterioration. Critical care was time spent personally by me on the following activities: development of treatment plan with patient and/or surrogate as well as nursing, discussions with consultants, evaluation of patient's response to treatment, examination of patient, obtaining history from patient or surrogate, ordering and performing treatments and interventions, ordering and review of laboratory studies, ordering and review of radiographic studies, pulse oximetry and re-evaluation of patient's condition.  Medications Ordered in ED Medications  iopamidol (ISOVUE-370) 76 % injection (not administered)  diphenhydrAMINE (BENADRYL) injection 25 mg (not administered)  methylPREDNISolone sodium succinate (SOLU-MEDROL) 125 mg/2 mL injection 125 mg (not administered)  famotidine (PEPCID) IVPB 20 mg premix (not administered)  diphenhydrAMINE (BENADRYL) 50 MG/ML injection (not administered)  methylPREDNISolone sodium succinate (SOLU-MEDROL) 125 mg/2 mL injection (not administered)     Initial Impression / Assessment and Plan / ED Course  I have reviewed the triage vital signs and the nursing notes.  Pertinent labs & imaging results that were available during my care of the patient were reviewed by me and considered in my medical decision making (see chart for details).  Left hemisphere stroke with right hemiparesis and aphasia.  Head CT is negative for bleed, question dense left M1 branch.  Patient seen in conjunction with Dr. Cheral Marker, neuro-hospitalist.  CT angiogram  has been ordered.  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  Arrangements are made for interventional radiology to treat this.  Final Clinical Impressions(s) / ED Diagnoses   Final diagnoses:  Cerebrovascular accident (CVA), unspecified mechanism ([place])  Internal carotid artery occlusion, left  Renal insufficiency  Normochromic normocytic anemia    ED Discharge Orders    None      Delora Fuel, MD 73/42/87 [phone]

## 2017-05-06 NOTE — Progress Notes (Addendum)
Inpatient Diabetes Program Recommendations  AACE/ADA: New Consensus Statement on Inpatient Glycemic Control (2015)  Target Ranges:  Prepandial:   less than 140 mg/dL      Peak postprandial:   less than 180 mg/dL (1-2 hours)      Critically ill patients:  140 - 180 mg/dL   Lab Results  Component Value Date   GLUCAP 162 (H) 05/06/2017   Review of Glycemic Control  Diabetes history: DM 2 Outpatient Diabetes medications: Glipizide 10 mg Daily, Tradjenta 5 mg Daily Current orders for Inpatient glycemic control: Novolog Moderate Correction 0-15 units tid + Novolog HS scale 0-5 units + Novolog 4 units meal coverage tid  Inpatient Diabetes Program Recommendations:     Admitted for stroke, Has had whole brain radiation prophylactically for lung cancer, had acute thrombectomy  2/28 Patient had IV Solumedrol 125 mg x1 dose this am glucose in 300's. SQ insulin ordered and started this am. Lunch glucose 162 mg/dl. Do not recommend oral DM medications while inpatient at this time. May have to decrease meal coverage and or correction scale based on trends.  Noted lunchtime insulin given 1309, however CBG time was 1141 am.  Thanks,  Tama Headings RN, MSN, BC-ADM, [facility] Inpatient Diabetes Coordinator Team Pager [phone] (8a-5p)

## 2017-05-06 NOTE — Code Documentation (Signed)
Responded to code stroke.  EMS reports pt LSN at 1 AM when he laid down to bed.  Pt awoke at 2 am with right sided weakness and difficulty walking.  Code stroke was called at [place].  Pt arrived to the ED at [place].  Head CT negative for bleed, CTA shows L ICA occlusion and L mid M1 occlusion.  Initial NIHSS 8 with findings of R arm drift, R leg drift, facial droop, and confusion.  Decision was made to take pt to IR.  Pt arrived in IR at 0435.

## 2017-05-06 NOTE — Sedation Documentation (Signed)
Report given to Fredric Dine, RN.

## 2017-05-06 NOTE — Telephone Encounter (Signed)
[name] called reporting that Edwin Jones is in the hospital and she would like a return call [phone].   Per Phone call by Dr Tasia Catchings today, patient is at [facility] with a stroke

## 2017-05-06 NOTE — Sedation Documentation (Signed)
[facility] called and made aware of pt coming to them. Spoke with [employer].

## 2017-05-06 NOTE — Evaluation (Signed)
Clinical/Bedside Swallow Evaluation Patient Details  Name: Edwin Jones MRN: [id number] Date of Birth: 08-19-1940  Today's Date: 05/06/2017 Time: SLP Start Time (ACUTE ONLY): 5093 SLP Stop Time (ACUTE ONLY): 1705 SLP Time Calculation (min) (ACUTE ONLY): 11 min  Past Medical History:  Past Medical History:  Diagnosis Date  . Cancer ([place])   . COPD (chronic obstructive pulmonary disease) ([place])   . Diabetes mellitus without complication ([place])   . GERD without esophagitis   . Hepatitis C virus    history of hep C treated with Harvoni  . Hypercholesterolemia   . Hypertension   . Seasonal allergies    Past Surgical History:  Past Surgical History:  Procedure Laterality Date  . COLONOSCOPY     approximately 11 years ago  . FLEXIBLE BRONCHOSCOPY N/A 11/05/2016   Procedure: FLEXIBLE BRONCHOSCOPY;  Surgeon: Wilhelmina Mcardle, MD;  Location: [facility] ORS;  Service: Pulmonary;  Laterality: N/A;  . IR FLUORO GUIDE CV LINE RIGHT  05/06/2017  . IR INTRAVSC STENT CERV CAROTID W/O EMB-PROT MOD SED INC ANGIO  05/06/2017  . IR PERCUTANEOUS ART THROMBECTOMY/INFUSION INTRACRANIAL INC DIAG ANGIO  05/06/2017  . IR US GUIDE VASC ACCESS LEFT  05/06/2017  . IR US GUIDE VASC ACCESS RIGHT  05/06/2017  . IR US GUIDE VASC ACCESS RIGHT  05/06/2017  . PORTA CATH INSERTION N/A 11/23/2016   Procedure: Glori Luis Cath Insertion;  Surgeon: Algernon Huxley, MD;  Location: [facility] [facility];  Service: Cardiovascular;  Laterality: N/A;  . TONSILLECTOMY     HPI:  Edwin Jones is a 77 y/o male who presented by EMS as code stroke. The history is provided by the patient and the EMS personnel. The history is limited by the condition of the patient (Aphasia). He has history of diabetes, hypertension, hyperlipidemia, COPD, small cell lung cancer and comes in with right-sided weakness. He was last known normal at 1 AM when he walked to go to bed. He woke up at about 2 AM with right-sided weakness and unable to walk. He denies any  headache. EMS transported him here as a code stroke. Of note, he just completed prophylactic cranial radiation. His lung cancer has been treated with chemotherapy and radiation treatment, both of which have been completed. 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 Arrangements are made for interventional radiology to treat and MRI revealed restored patency of the Left ICA, terminus, and Left MCA since earlier today. Associated core infarct primarily confined to the Left Basal Ganglia. There is a small 9 mm infarct in the left amygdala, and there are small number of scattered tiny cortical infarcts elsewhere in the Left MCA territory. Mild cytotoxic edema with no associated hemorrhage or mass effect and Mild posterior and right anterior circulation atherosclerosis, stable from the CTA earlier today.   Assessment / Plan / Recommendation Clinical Impression  Pt presents with mild risk of aspiration when drinking thin liquids via cup sips that are limited to 2 sips at a time. When consuming thin liquids via straw and also when consuming 3 to 4 cup sips consecutively, pt with delayed cough - largely related to consecutive sips. Although pt demonstrates right sided facial weakness, he was able to functionally masticate trials of graham crackers without incidence. Recommend pt consume regular diet with thin liquids via cup sips limited to 2 sips at a time and medicine whole with water or puree if needed. Reocmmend full nursing supervision to aid in limiting sips of thin liquids. Education provided to nursing  and posted in room. ST to follow acutely for diet safety.  SLP Visit Diagnosis: Dysphagia, unspecified (R13.10)    Aspiration Risk  Mild aspiration risk    Diet Recommendation Regular;Thin liquid   Liquid Administration via: Cup Medication Administration: Whole meds with liquid Supervision: Staff to assist with self feeding;Full supervision/cueing for compensatory  strategies Compensations: Minimize environmental distractions;Slow rate;Small sips/bites Postural Changes: Seated upright at 90 degrees    Other  Recommendations Oral Care Recommendations: Oral care BID   Follow up Recommendations (TBD)      Frequency and Duration min 2x/week  2 weeks       Prognosis Prognosis for Safe Diet Advancement: Good      Swallow Study   General Date of Onset: 05/06/17 HPI: Edwin Jones is a 77 y/o male who presented by EMS as code stroke. The history is provided by the patient and the EMS personnel. The history is limited by the condition of the patient (Aphasia). He has history of diabetes, hypertension, hyperlipidemia, COPD, small cell lung cancer and comes in with right-sided weakness. He was last known normal at 1 AM when he walked to go to bed. He woke up at about 2 AM with right-sided weakness and unable to walk. He denies any headache. EMS transported him here as a code stroke. Of note, he just completed prophylactic cranial radiation. His lung cancer has been treated with chemotherapy and radiation treatment, both of which have been completed. 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 Arrangements are made for interventional radiology to treat and MRI revealed restored patency of the Left ICA, terminus, and Left MCA since earlier today. Associated core infarct primarily confined to the Left Basal Ganglia. There is a small 9 mm infarct in the left amygdala, and there are small number of scattered tiny cortical infarcts elsewhere in the Left MCA territory. Mild cytotoxic edema with no associated hemorrhage or mass effect and Mild posterior and right anterior circulation atherosclerosis, stable from the CTA earlier today. Type of Study: Bedside Swallow Evaluation Previous Swallow Assessment: none in chart Diet Prior to this Study: Regular;Thin liquids Temperature Spikes Noted: No Respiratory Status: Nasal cannula History of Recent  Intubation: No Behavior/Cognition: Alert;Cooperative;Pleasant mood Oral Cavity Assessment: Within Functional Limits Oral Care Completed by SLP: Recent completion by staff Oral Cavity - Dentition: Dentures, top;Dentures, bottom Self-Feeding Abilities: Needs assist Patient Positioning: Upright in bed Baseline Vocal Quality: Low vocal intensity Volitional Cough: Strong Volitional Swallow: Able to elicit    Oral/Motor/Sensory Function Overall Oral Motor/Sensory Function: Moderate impairment Facial ROM: Reduced right Facial Symmetry: Abnormal symmetry right Facial Strength: Reduced right Lingual ROM: Reduced right   Ice Chips Ice chips: Within functional limits Presentation: Spoon   Thin Liquid Thin Liquid: Impaired Presentation: Straw;Cup;Self Fed Pharyngeal  Phase Impairments: Cough - Delayed(With consecutive sips via straw)    Nectar Thick Nectar Thick Liquid: Not tested   Honey Thick Honey Thick Liquid: Not tested   Puree Puree: Within functional limits Presentation: Spoon   Solid   GO   Solid: Within functional limits        Edwin Jones 05/06/2017,6:38 PM

## 2017-05-06 NOTE — Anesthesia Postprocedure Evaluation (Signed)
Anesthesia Post Note  Patient: Edwin Jones  Procedure(s) Performed: IR WITH ANESTHESIA (N/A )     Patient location during evaluation: PACU Anesthesia Type: General Level of consciousness: awake and alert Pain management: pain level controlled Vital Signs Assessment: post-procedure vital signs reviewed and stable Respiratory status: spontaneous breathing, nonlabored ventilation, respiratory function stable and patient connected to nasal cannula oxygen Cardiovascular status: blood pressure returned to baseline and stable Postop Assessment: no apparent nausea or vomiting Anesthetic complications: no    Last Vitals:  Vitals:   05/06/17 0800 05/06/17 0815  BP:    Pulse: 63 (!) 58  Resp: 15 10  Temp:  (!) 36.3 C  SpO2: 99% 100%    Last Pain:  Vitals:   05/06/17 0800  TempSrc:   PainSc: 0-No pain                 Namir Neto,W. EDMOND

## 2017-05-06 NOTE — Progress Notes (Signed)
NeuroInterventional Radiology Pre-Procedure Note  History: 77 yo male presents to [facility] with acute right sided weakness and speech difficulty.  He was identified with new onset symptoms at ~2am by his wife, with LKW of 1am.    He is a gentleman recently undergoing whole brain radiation for prophylactic treatment of his known lung cancer.  His wife tells me he has no known brain mets.   He is normal function at baseline.   Baseline mRS: .0 NIHSS:  . 8  CT ASPECTS: 10 CTA:   .left ICA occlusion and left M1 occlusion CTP:   N/A   Given the patient's symptoms, imaging findings, baseline function, I believe they are an appropriate candidate for mechanical thrombectomy.    We know that tandem occlusion have high mortality/morbidity, and he has the best chance for meaningful recovery with intervention.  I have discussed his case with Dr. Cheral Marker of Stroke Neurology.   The risks and benefits of the procedure were discussed with the patient/patient's family, with specific risks including: bleeding, infection, arterial injury/dissection, contrast reaction, kidney injury, need for further procedure/surgery, neurologic deficit, 10-15% risk of intracranial hemorrhage, cardiopulmonary collapse, death. All questions were answered.  The patient/family would like to proceed with attempt at thrombectomy.   Plan for cerebral angiogram and attempt at mechanical thrombectomy, with plan for possible carotid stenting.   Signed,  Dulcy Fanny. Earleen Newport, DO

## 2017-05-06 NOTE — Evaluation (Signed)
Speech Language Pathology Evaluation Patient Details Name: Edwin Jones MRN: [id number] DOB: May 25, 1940 Today's Date: 05/06/2017 Time: 8333-8329 SLP Time Calculation (min) (ACUTE ONLY): 13 min  Problem List:  Patient Active Problem List   Diagnosis Date Noted  . Stroke ([place]) 05/06/2017  . Stroke (cerebrum) ([place]) 05/06/2017  . 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 Cancer of upper lobe of left lung ([place]) 10/30/2016  . Pure hypercholesterolemia 06/22/2014  . GERD without esophagitis 06/22/2014  . Benign essential hypertension 06/22/2014  . Diabetes mellitus without complication ([place]) 19/16/6060   Past Medical History:  Past Medical History:  Diagnosis Date  . Cancer ([place])   . COPD (chronic obstructive pulmonary disease) ([place])   . Diabetes mellitus without complication ([place])   . GERD without esophagitis   . Hepatitis C virus    history of hep C treated with Harvoni  . Hypercholesterolemia   . Hypertension   . Seasonal allergies    Past Surgical History:  Past Surgical History:  Procedure Laterality Date  . COLONOSCOPY     approximately 11 years ago  . FLEXIBLE BRONCHOSCOPY N/A 11/05/2016   Procedure: FLEXIBLE BRONCHOSCOPY;  Surgeon: Wilhelmina Mcardle, MD;  Location: [facility] ORS;  Service: Pulmonary;  Laterality: N/A;  . IR FLUORO GUIDE CV LINE RIGHT  05/06/2017  . IR INTRAVSC STENT CERV CAROTID W/O EMB-PROT MOD SED INC ANGIO  05/06/2017  . IR PERCUTANEOUS ART THROMBECTOMY/INFUSION INTRACRANIAL INC DIAG ANGIO  05/06/2017  . IR US GUIDE VASC ACCESS LEFT  05/06/2017  . IR US GUIDE VASC ACCESS RIGHT  05/06/2017  . IR US GUIDE VASC ACCESS RIGHT  05/06/2017  . PORTA CATH INSERTION N/A 11/23/2016   Procedure: Glori Luis Cath Insertion;  Surgeon: Algernon Huxley, MD;  Location: [place] [facility];  Service: Cardiovascular;  Laterality: N/A;  . TONSILLECTOMY     HPI:  Edwin Jones is a 77 y/o male who presented by EMS as code stroke. The history is provided by the patient and the  EMS personnel. The history is limited by the condition of the patient (Aphasia). He has history of diabetes, hypertension, hyperlipidemia, COPD, small cell lung cancer and comes in with right-sided weakness. He was last known normal at 1 AM when he walked to go to bed. He woke up at about 2 AM with right-sided weakness and unable to walk. He denies any headache. EMS transported him here as a code stroke. Of note, he just completed prophylactic cranial radiation. His lung cancer has been treated with chemotherapy and radiation treatment, both of which have been completed. 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 Arrangements are made for interventional radiology to treat and MRI revealed restored patency of the Left ICA, terminus, and Left MCA since earlier today. Associated core infarct primarily confined to the Left Basal Ganglia. There is a small 9 mm infarct in the left amygdala, and there are small number of scattered tiny cortical infarcts elsewhere in the Left MCA territory. Mild cytotoxic edema with no associated hemorrhage or mass effect and Mild posterior and right anterior circulation atherosclerosis, stable from the CTA earlier today.   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 Therefore connected speech is garbled and is not communicative. Pt is aware or errors but unable to self-correct. Pt is unable to name items in room or provide/imitate rote information such as days of the week. SLP facilitated  communication of ideas/object naming by asking yes/no questions. Pt with good accuracy with basic yes/no questions regarding current environment. Cognitive function is difficult to fully assess at this time but pt is able to sustain attention, answer yes/no questions and demonstrate functional pragmatics during interaction. Skilled ST is  required to target expressive aphasia and futher assess receptive and cognitive abilities.     SLP Assessment  SLP Recommendation/Assessment: Patient needs continued Speech Lanaguage Pathology Services SLP Visit Diagnosis: Aphasia (R47.01)    Follow Up Recommendations  Inpatient Rehab    Frequency and Duration min 2x/week  2 weeks      SLP Evaluation Cognition  Overall Cognitive Status: Difficult to assess(d/t expressive aphasia) Arousal/Alertness: Awake/alert Orientation Level: Oriented to person;Oriented to place;Disoriented to situation;Oriented to time Attention: Sustained Sustained Attention: Appears intact       Comprehension  Auditory Comprehension Overall Auditory Comprehension: Impaired Yes/No Questions: Within Functional Limits(related to self and very basic) Commands: Not tested    Expression Expression Primary Mode of Expression: Verbal Verbal Expression Overall Verbal Expression: Impaired Initiation: No impairment Automatic Speech: Name Level of Generative/Spontaneous Verbalization: Sentence Repetition: Impaired Level of Impairment: Word level Naming: Impairment Pragmatics: No impairment Non-Verbal Means of Communication: Not applicable Written Expression Dominant Hand: Right Written Expression: Not tested   Oral / Motor  Oral Motor/Sensory Function Overall Oral Motor/Sensory Function: Moderate impairment Facial ROM: Reduced right Facial Symmetry: Abnormal symmetry right Facial Strength: Reduced right Lingual ROM: Reduced right Motor Speech Overall Motor Speech: Impaired Respiration: Within functional limits Phonation: Normal;Low vocal intensity Resonance: Within functional limits Intelligibility: Intelligibility reduced(d/t phonemic paraphasias) Word: 75-100% accurate Phrase: 25-49% accurate Sentence: 0-24% accurate Conversation: Not tested   GO                    Edwin Jones 05/06/2017, 6:52 PM

## 2017-05-06 NOTE — Procedures (Signed)
Neuro-Interventional Radiology Post Cerebral Angiogram Procedure Note  History:  26am male with acute left tandem occlusion, with left ICA and left MCA occlusions.    Was not a candidate for IV tPA, as he has had whole brain radiation for prophylaxis.    Baseline mRS:  0 NIHSS:   8 Last Known Well:  1am ASPECTS:   10 Anesthesia    GETA Skin Puncture:   05:03am [place] Date & Time: 6:10am  IA Medication:  62mcg IA nitro Proximal or Distal:  Left ICA occlusion at the bifurcation, with left M1 occlusion Post TICI Score:  mTICI 3 Skin Puncture to Final: 73min Device:   4x40 solitaire  Procedure:  US guided right cfa access with 53F, closed with 53F Angioseal closure US guided left cfa access with 44F, for arterial monitoring US guided right CFV access with 3-lumen catheter for IV access Cerebral angiogram Revascularization of left ICA occlusion with balloon angioplasty and placement of left ICA stent, 8-6x68mm Xact. Mechanical thrombectomy of Left M1 occlusion with solitaire  Flat panel head CT in room before extubation  Findings:  Acute left ica and M1 tandem occlusion.    Post ICA stent, there is excellent flow, with mTICI 3 flow after M1 thrombectomy  Complications: None  EBL: 50cc  Recommendations: - Extubated in the room - To PACU, with ICU 4N 29 bed assignment - IV hydration - Goal SBP of 120-140 post revasc with cardene order - Goal is dual anti-platelet therapy for acute left carotid stent, with 1 dose of 650mg  ASA via NG provided, and currently Integrilin ggt per protocol - Goal is continue 325mg  ASA, with loading dose of plavix in ~24 hours, and daily ASA and plavix thereafter - right hip straight until the central line can be removed - left hip straight until the left CFA arterial sheath is removed - NIR will follow  Signed,  Dulcy Fanny. Earleen Newport, DO

## 2017-05-06 NOTE — Consult Note (Addendum)
Referring Physician: Dr. Roxanne Mins    Chief Complaint: Acute onset of right hemiparesis and facial droop  HPI: Edwin Jones is an 77 y.o. male with known small cell cancer with brain metastases who presents with acute onset of right hemiparesis and facial droop. He was in his USOH when he went to bed at 1 AM. On awakening to go to the bathroom, he noted right sided weakness. He woke up his wife, who called EMS. Vitals en route: CBG 476, BP 179/90. He has no prior history of stroke.   LSN: [id number] tPA Given: No: Recent whole brain irradiation, presenting increased risk for hemorrhage NIHSS: 8  Past Medical History:  Diagnosis Date  . COPD (chronic obstructive pulmonary disease) ([place])   . Diabetes mellitus without complication ([place])   . GERD without esophagitis   . Hepatitis C virus    history of hep C treated with Harvoni  . Hypercholesterolemia   . Hypertension   . Seasonal allergies     Past Surgical History:  Procedure Laterality Date  . COLONOSCOPY     approximately 11 years ago  . FLEXIBLE BRONCHOSCOPY N/A 11/05/2016   Procedure: FLEXIBLE BRONCHOSCOPY;  Surgeon: Wilhelmina Mcardle, MD;  Location: [facility] ORS;  Service: Pulmonary;  Laterality: N/A;  . PORTA CATH INSERTION N/A 11/23/2016   Procedure: Glori Luis Cath Insertion;  Surgeon: Algernon Huxley, MD;  Location: [place] [facility];  Service: Cardiovascular;  Laterality: N/A;  . TONSILLECTOMY      Family History  Problem Relation Age of Onset  . Skin cancer Mother   . Ovarian cancer Sister        sister was diagnosed as a teenager  . Throat cancer Brother 33       brother #1  . Lung cancer Sister 68  . Throat cancer Brother 57       brother #2  . Skin cancer Sister    Social History:  reports that he quit smoking about 13 years ago. His smoking use included cigarettes. He has a 50.00 pack-year smoking history. he has never used smokeless tobacco. He reports that he does not drink alcohol or use drugs.  Allergies:  Allergies    Allergen Reactions  . Penicillin G Anaphylaxis    Other reaction(s): Other (See Comments) Couldn't breath among other things Has patient had a PCN reaction causing immediate rash, facial/tongue/throat swelling, SOB or lightheadedness with hypotension: Yes Has patient had a PCN reaction causing severe rash involving mucus membranes or skin necrosis: No Has patient had a PCN reaction that required hospitalization: Yes Has patient had a PCN reaction occurring within the last 10 years: No If all of the above answers are "NO", then may proceed with Ce  . Shellfish-Derived Products Anaphylaxis  . Statins     Other reaction(s): Other (See Comments) Muscle spasms - can't walk - couldn't turn over in bed  . Erythromycin Itching    All mycins  . Tamsulosin     Other reaction(s): Dizziness  . Tuberculin Ppd     Other reaction(s): Other (See Comments) False test   . Iodinated Diagnostic Agents Hives    Home Medications:    ROS: As per HPI. Detailed ROS deferred due to acuity of presentation.   Physical Examination: There were no vitals taken for this visit.  HEENT: [place]/AT Lungs: Respirations unlabored Ext: Warm and well perfused  Neurologic Examination: Mental Status: Alert, poor orientation. Speech dysarthric with word finding deficit and decreased fluency. Able to follow most commands,  some with difficulty. Mildly impaired repetition.  Cranial Nerves: II:  Visual fields intact bilaterally. PERRL.  III,IV, VI: Some hesitancy gazing to the right V,VII: Right facial droop. Facial temp sensation equal bilaterally VIII: hearing intact to voice IX,X: Pharyngeal dysarthria XI: Decreased strength on left XII: midline tongue extension  Motor: RUE and RLE 4/5 with drift LUE and LLE 5/5 Normal tone throughout; no atrophy noted Sensory: Temp and light touch intact x 4 proximally. Extinction on the left Deep Tendon Reflexes:  Unremarkable Plantars: Right: downgoing  Left:  downgoing Cerebellar: Slow FNF on the right Gait: Deferred  CT head: No acute stroke, hemorrhage, or mass effect identified. 2. Dense left M1, possible thrombus. 3. ASPECTS is 10 4. Stable chronic microvascular ischemic changes and parenchymal volume loss of the brain given differences in technique.  CTA head/neck:  1. 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 Short segment of patency of left ICA terminus and proximal left M1 via anterior communicating artery. 2. Left mid M1 occlusion with poor left MCA collateralization. 3. Patent right carotid and bilateral vertebral systems. 4. Patent bilateral ACA, right MCA, and bilateral PCA distributions. Intracranial atherosclerosis with multiple segments of mild-to-moderate stenosis  EKG: Sinus rhythm, LAFB  Assessment: 77 y.o. male with small cell lung CA, presenting with acute onset of right hemiparesis and facial droop.  1. Exam findings most consistent with localization to left cerebral hemisphere 2. NIHSS: 8 3. CT head: Dense left M1. No acute hypodensity 4. 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 Short segment of patency of left ICA terminus and proximal left M1 via anterior communicating artery. Left mid M1 occlusion with poor left MCA collateralization. 5. Stroke Risk Factors - DM, hypercholesterolemia and HTN 6. Hepatitis C treated with Harvoni. 7. Has statin allergy.  8. Allergy to iodinated contrast requiring premedication for CTA 9. Classifiable as having failed ASA monotherapy  Plan: 1. The patient was not considered a safe candidate for tPA given recent whole brain irradiation with possible small vessel fragility and increased risk for hemorrhage. Discussed with patient who expressed agreement 2. The patient is a candidate for VIR. Discussed risks and benefits with patient, who tentatively agrees to have procedure done. Final consent to be obtained by VIR team.  3. Admit to ICU following VIR 4. BP  management 5. No anticoagulants for at least 24 hours following VIR. ASA decision per VIR team. Repeat CT at 24 hours. 6. MRI of the brain without contrast 7. PT consult, OT consult, Speech consult 8. Echocardiogram 9. DVT prophylaxis with SCDs 10. Telemetry monitoring 11. Frequent neuro checks 12. HgbA1c, fasting lipid panel  80 minutes spent in the emergent neurological evaluation and management of this critically ill patient  _0  signed: Dr. Kerney Elbe 05/06/2017, 3:18 AM

## 2017-05-07 ENCOUNTER — Encounter (HOSPITAL_COMMUNITY): Payer: Self-pay | Admitting: Radiology

## 2017-05-07 ENCOUNTER — Telehealth: Payer: Self-pay | Admitting: Internal Medicine

## 2017-05-07 ENCOUNTER — Inpatient Hospital Stay (HOSPITAL_COMMUNITY): Payer: Medicare Other

## 2017-05-07 DIAGNOSIS — I361 Nonrheumatic tricuspid (valve) insufficiency: Secondary | ICD-10-CM

## 2017-05-07 LAB — ECHOCARDIOGRAM COMPLETE
AOASC: 35 cm
Area-P 1/2: 3.24 cm2
CHL CUP TV REG PEAK VELOCITY: 322 cm/s
E decel time: 232 msec
EERAT: 9.81
FS: 51 % — AB (ref 28–44)
HEIGHTINCHES: 75 in
IVS/LV PW RATIO, ED: 0.88
LA ID, A-P, ES: 43 mm
LA diam end sys: 43 mm
LA vol A4C: 72 ml
LA vol index: 31.9 mL/m2
LADIAMINDEX: 1.9 cm/m2
LAVOL: 72.3 mL
LV E/e'average: 9.81
LVEEMED: 9.81
LVELAT: 10.8 cm/s
LVOT area: 3.8 cm2
LVOT diameter: 22 mm
MV Dec: 232
MV Peak grad: 4 mmHg
MVPKAVEL: 124 m/s
MVPKEVEL: 106 m/s
P 1/2 time: 68 ms
PW: 14 mm — AB (ref 0.6–1.1)
RV LATERAL S' VELOCITY: 14.6 cm/s
RV TAPSE: 24.8 mm
TDI e' lateral: 10.8
TDI e' medial: 5.96
TR max vel: 322 cm/s
Weight: 3393.32 oz

## 2017-05-07 LAB — GLUCOSE, CAPILLARY
GLUCOSE-CAPILLARY: 226 mg/dL — AB (ref 65–99)
Glucose-Capillary: 176 mg/dL — ABNORMAL HIGH (ref 65–99)
Glucose-Capillary: 151 mg/dL — ABNORMAL HIGH (ref 65–99)
Glucose-Capillary: 118 mg/dL — ABNORMAL HIGH (ref 65–99)

## 2017-05-07 LAB — BASIC METABOLIC PANEL
Anion gap: 8 (ref 5–15)
BUN: 30 mg/dL — ABNORMAL HIGH (ref 6–20)
CHLORIDE: 109 mmol/L (ref 101–111)
CO2: 22 mmol/L (ref 22–32)
CREATININE: 1.88 mg/dL — AB (ref 0.61–1.24)
Calcium: 8.2 mg/dL — ABNORMAL LOW (ref 8.9–10.3)
GFR calc non Af Amer: 33 mL/min — ABNORMAL LOW (ref >60)
GFR, EST AFRICAN AMERICAN: 38 mL/min — AB (ref >60)
Glucose, Bld: 203 mg/dL — ABNORMAL HIGH (ref 65–99)
Potassium: 4.3 mmol/L (ref 3.5–5.1)
SODIUM: 139 mmol/L (ref 135–145)

## 2017-05-07 LAB — CBC
HCT: 33.4 % — ABNORMAL LOW (ref 39.0–52.0)
HEMOGLOBIN: 10.7 g/dL — AB (ref 13.0–17.0)
MCH: 30.8 pg (ref 26.0–34.0)
MCHC: 32 g/dL (ref 30.0–36.0)
MCV: 96.3 fL (ref 78.0–100.0)
Platelets: 176 10*3/uL (ref 150–400)
RBC: 3.47 MIL/uL — AB (ref 4.22–5.81)
RDW: 13.2 % (ref 11.5–15.5)
WBC: 8.3 10*3/uL (ref 4.0–10.5)

## 2017-05-07 LAB — LIPID PANEL
Cholesterol: 173 mg/dL (ref 0–200)
HDL: 61 mg/dL (ref >40)
LDL CALC: 69 mg/dL (ref 0–99)
TRIGLYCERIDES: 215 mg/dL — AB (ref <150)
Total CHOL/HDL Ratio: 2.8 RATIO
VLDL: 43 mg/dL — ABNORMAL HIGH (ref 0–40)

## 2017-05-07 LAB — HEMOGLOBIN A1C
HEMOGLOBIN A1C: 9.1 % — AB (ref 4.8–5.6)
MEAN PLASMA GLUCOSE: 214.47 mg/dL

## 2017-05-07 MED ORDER — CLOPIDOGREL BISULFATE 75 MG PO TABS
75.0000 mg | ORAL_TABLET | Freq: Every day | ORAL | Status: DC
  Administered 2017-05-08 – 2017-05-11 (×4): 75 mg via ORAL
  Filled 2017-05-07 (×4): qty 1

## 2017-05-07 MED ORDER — MIDODRINE HCL 5 MG PO TABS
5.0000 mg | ORAL_TABLET | Freq: Three times a day (TID) | ORAL | Status: DC
  Administered 2017-05-07 – 2017-05-11 (×12): 5 mg via ORAL
  Filled 2017-05-07 (×12): qty 1

## 2017-05-07 MED ORDER — ACYCLOVIR 5 % EX OINT
TOPICAL_OINTMENT | CUTANEOUS | Status: DC
  Administered 2017-05-07 – 2017-05-09 (×13): via TOPICAL
  Filled 2017-05-07: qty 15

## 2017-05-07 MED ORDER — CLOPIDOGREL BISULFATE 75 MG PO TABS
300.0000 mg | ORAL_TABLET | Freq: Once | ORAL | Status: AC
  Administered 2017-05-07: 300 mg via ORAL
  Filled 2017-05-07: qty 4

## 2017-05-07 NOTE — Progress Notes (Signed)
Inpatient Diabetes Program Recommendations  AACE/ADA: New Consensus Statement on Inpatient Glycemic Control (2015)  Target Ranges:  Prepandial:   less than 140 mg/dL      Peak postprandial:   less than 180 mg/dL (1-2 hours)      Critically ill patients:  140 - 180 mg/dL  Results for Edwin Jones, Edwin Jones (MRN [id number]) as of 05/07/2017 11:34  Ref. Range 05/06/2017 07:43 05/06/2017 08:44 05/06/2017 11:41 05/06/2017 16:58 05/06/2017 17:38 05/06/2017 18:08 05/06/2017 21:39 05/07/2017 08:15  Glucose-Capillary Latest Ref Range: 65 - 99 mg/dL 279 (H) 332 (H) 162 (H) 54 (L) 59 (L) 87 72 226 (H)    Review of Glycemic Control  Diabetes history: DM2 Outpatient Diabetes medications: Glipizide 10 mg daily, Tradjenta 5 mg daily Current orders for Inpatient glycemic control: Glipizide 10 mg daily, Tradjenta 5 mg daily, Novolog 0-15 units TID with meals, Novolog 0-5 units QHS, Novolog 4 units TID with meals  Inpatient Diabetes Program Recommendations: Oral Agents: While inpatient, please consider discontinuing Glipizide and Tradjenta.  Thanks, Barnie Alderman, RN, MSN, CDE Diabetes Coordinator Inpatient Diabetes Program [phone] (Team Pager from 8am to 5pm)

## 2017-05-07 NOTE — Progress Notes (Signed)
    [facility] has been requested to perform a transesophageal echocardiogram on Mr. Jeppsen for stroke.  After careful review of history and examination, the risks and benefits of transesophageal echocardiogram have been explained including risks of esophageal damage, perforation (1:10,000 risk), bleeding, pharyngeal hematoma as well as other potential complications associated with conscious sedation including aspiration, arrhythmia, respiratory failure and death. Alternatives to treatment were discussed, questions were answered. Patient is willing to proceed.  TEE - Dr. Sallyanne Kuster 05/10/17 @2pm  . NPO after midnight. Meds with sips.   Leanor Kail, PA-C 05/07/2017 3:48 PM

## 2017-05-07 NOTE — Progress Notes (Signed)
STROKE TEAM PROGRESS NOTE   SUBJECTIVE (INTERVAL HISTORY) His wife is at the bedside.  Overall he feels his condition is stable. Had integrelin 24 hour now and will change to plavix with loading dose. Pt BP stable with neo drip but not able to taper off at this time, will put on midodrine. Albumin 4 doses finished.    OBJECTIVE Temp:  [98.3 F (36.8 C)-100.3 F (37.9 C)] 98.4 F (36.9 C) (03/01 1200) Pulse Rate:  [51-80] 67 (03/01 1400) Resp:  [11-24] 24 (03/01 1400) BP: (106-146)/(57-70) 136/64 (03/01 1400) SpO2:  [91 %-100 %] 94 % (03/01 1400) Arterial Line BP: (93)/(88) 93/88 (02/28 1500)  Recent Labs  Lab 05/06/17 1738 05/06/17 1808 05/06/17 2139 05/07/17 0815 05/07/17 1151  GLUCAP 59* 87 72 226* 176*   Recent Labs  Lab 05/06/17 0312 05/06/17 0329 05/07/17 0500  NA 135 137 139  K 4.3 4.3 4.3  CL 101 103 109  CO2 22  --  22  GLUCOSE 383* 366* 203*  BUN 40* 37* 30*  CREATININE 1.98* 1.80* 1.88*  CALCIUM 9.1  --  8.2*   Recent Labs  Lab 05/06/17 0312  AST 16  ALT 13*  ALKPHOS 100  BILITOT 0.4  PROT 6.6  ALBUMIN 3.1*   Recent Labs  Lab 05/06/17 0312 05/06/17 0329 05/07/17 0500  WBC 8.0  --  8.3  NEUTROABS 6.3  --   --   HGB 12.6* 13.3 10.7*  HCT 38.4* 39.0 33.4*  MCV 95.5  --  96.3  PLT 185  --  176   No results for input(s): CKTOTAL, CKMB, CKMBINDEX, TROPONINI in the last 168 hours. Recent Labs    05/06/17 0312  LABPROT 12.3  INR 0.92   No results for input(s): COLORURINE, LABSPEC, PHURINE, GLUCOSEU, HGBUR, BILIRUBINUR, KETONESUR, PROTEINUR, UROBILINOGEN, NITRITE, LEUKOCYTESUR in the last 72 hours.  Invalid input(s): APPERANCEUR     Component Value Date/Time   CHOL 173 05/07/2017 0500   TRIG 215 (H) 05/07/2017 0500   HDL 61 05/07/2017 0500   CHOLHDL 2.8 05/07/2017 0500   VLDL 43 (H) 05/07/2017 0500   LDLCALC 69 05/07/2017 0500   Lab Results  Component Value Date   HGBA1C 9.1 (H) 05/07/2017      Component Value Date/Time   LABOPIA  NONE DETECTED 05/06/2017 1011   COCAINSCRNUR NONE DETECTED 05/06/2017 1011   LABBENZ NONE DETECTED 05/06/2017 1011   AMPHETMU NONE DETECTED 05/06/2017 1011   THCU NONE DETECTED 05/06/2017 1011   LABBARB NONE DETECTED 05/06/2017 1011    No results for input(s): ETH in the last 168 hours.  I have personally reviewed the radiological images below and agree with the radiology interpretations.  Ct Angio Head W Or Wo Contrast  Result Date: 05/06/2017 CLINICAL DATA:  77 y/o M; right-sided weakness, numbness, slurred speech. EXAM: CT ANGIOGRAPHY HEAD AND NECK TECHNIQUE: Multidetector CT imaging of the head and neck was performed using the standard protocol during bolus administration of intravenous contrast. Multiplanar CT image reconstructions and MIPs were obtained to evaluate the vascular anatomy. Carotid stenosis measurements (when applicable) are obtained utilizing NASCET criteria, using the distal internal carotid diameter as the denominator. CONTRAST:  7mL ISOVUE-370 IOPAMIDOL (ISOVUE-370) INJECTION 76% COMPARISON:  None. FINDINGS: CTA NECK FINDINGS Aortic arch: Standard branching. Imaged portion shows no evidence of aneurysm or dissection. No significant stenosis of the major arch vessel origins. Right carotid system: No evidence of dissection, stenosis (50% or greater) or occlusion. Left carotid system: Patent left common carotid artery.  Left ICA occlusion to the terminus where there is a short segment of reconstitution of the terminal ICA and proximal left M1 via the anterior communicating artery. Vertebral arteries: Codominant. No evidence of dissection, stenosis (50% or greater) or occlusion. Skeleton: Moderate cervical spine spondylosis with multilevel discogenic degenerative changes greatest at the C4 through C7 levels or uncovertebral and facet hypertrophy encroach on neural foramen and there is mild canal stenosis. Other neck: Negative. Upper chest: Right port catheter in the SVC extending  below field of view. Review of the MIP images confirms the above findings CTA HEAD FINDINGS Anterior circulation: Left mid M1 occlusion with poor left MCA distribution collateral circulation. Right distal M1 mild stenosis. No additional large vessel occlusion, aneurysm, or vascular malformation in the anterior circulation. Posterior circulation: No significant stenosis, proximal occlusion, aneurysm, or vascular malformation. Right P2 moderate stenosis and left P2 mild stenosis. Venous sinuses: As permitted by contrast timing, patent. Anatomic variants: Patent anterior and bilateral posterior communicating arteries. Review of the MIP images confirms the above findings IMPRESSION: 1. 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 Short segment of patency of left ICA terminus and proximal left M1 via anterior communicating artery. 2. Left mid M1 occlusion with poor left MCA collateralization. 3. Patent right carotid and bilateral vertebral systems. 4. Patent bilateral ACA, right MCA, and bilateral PCA distributions. Intracranial atherosclerosis with multiple segments of mild-to-moderate stenosis. These results were called by telephone at the time of interpretation on 05/06/2017 at 4:10 am to Dr. Cheral Marker, who verbally acknowledged these results. Electronically Signed   By: Kristine Garbe M.D.   On: 05/06/2017 04:17   Ct Angio Neck W And/or Wo Contrast  Result Date: 05/06/2017 CLINICAL DATA:  77 y/o M; right-sided weakness, numbness, slurred speech. EXAM: CT ANGIOGRAPHY HEAD AND NECK TECHNIQUE: Multidetector CT imaging of the head and neck was performed using the standard protocol during bolus administration of intravenous contrast. Multiplanar CT image reconstructions and MIPs were obtained to evaluate the vascular anatomy. Carotid stenosis measurements (when applicable) are obtained utilizing NASCET criteria, using the distal internal carotid diameter as the denominator. CONTRAST:  43mL ISOVUE-370 IOPAMIDOL  (ISOVUE-370) INJECTION 76% COMPARISON:  None. FINDINGS: CTA NECK FINDINGS Aortic arch: Standard branching. Imaged portion shows no evidence of aneurysm or dissection. No significant stenosis of the major arch vessel origins. Right carotid system: No evidence of dissection, stenosis (50% or greater) or occlusion. Left carotid system: Patent left common carotid artery. Left ICA occlusion to the terminus where there is a short segment of reconstitution of the terminal ICA and proximal left M1 via the anterior communicating artery. Vertebral arteries: Codominant. No evidence of dissection, stenosis (50% or greater) or occlusion. Skeleton: Moderate cervical spine spondylosis with multilevel discogenic degenerative changes greatest at the C4 through C7 levels or uncovertebral and facet hypertrophy encroach on neural foramen and there is mild canal stenosis. Other neck: Negative. Upper chest: Right port catheter in the SVC extending below field of view. Review of the MIP images confirms the above findings CTA HEAD FINDINGS Anterior circulation: Left mid M1 occlusion with poor left MCA distribution collateral circulation. Right distal M1 mild stenosis. No additional large vessel occlusion, aneurysm, or vascular malformation in the anterior circulation. Posterior circulation: No significant stenosis, proximal occlusion, aneurysm, or vascular malformation. Right P2 moderate stenosis and left P2 mild stenosis. Venous sinuses: As permitted by contrast timing, patent. Anatomic variants: Patent anterior and bilateral posterior communicating arteries. Review of the MIP images confirms the above findings IMPRESSION: 1. Left ICA  occlusion from origin to terminus. Short segment of patency of left ICA terminus and proximal left M1 via anterior communicating artery. 2. Left mid M1 occlusion with poor left MCA collateralization. 3. Patent right carotid and bilateral vertebral systems. 4. Patent bilateral ACA, right MCA, and bilateral  PCA distributions. Intracranial atherosclerosis with multiple segments of mild-to-moderate stenosis. These results were called by telephone at the time of interpretation on 05/06/2017 at 4:10 am to Dr. Cheral Marker, who verbally acknowledged these results. Electronically Signed   By: Kristine Garbe M.D.   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 Personal history of prior whole brain radiation for cancer, but no known brain metastases. EXAM: MRI HEAD WITHOUT CONTRAST MRA HEAD WITHOUT CONTRAST TECHNIQUE: Multiplanar, multiecho pulse sequences of the brain and surrounding structures were obtained without intravenous contrast. Angiographic images of the head were obtained using MRA technique without contrast. COMPARISON:  Cerebral angiogram, CTA head and neck, and noncontrast head CT earlier today. Prior brain MRI 11/19/2016. FINDINGS: MRI HEAD FINDINGS Brain: Restricted diffusion in the left basal ganglia (series 3, image 32) with T2 and FLAIR hyperintensity. No associated hemorrhage. No significant mass effect. There is a small 9 millimeter area of restricted diffusion in the medial left temporal lobe at the amygdala (series 3, image 20). There are otherwise a small number of tiny scattered foci of restricted diffusion in the left MCA territory (such as series 3, image 40). There is no right hemisphere or posterior circulation restricted diffusion. Patchy and scattered bilateral cerebral white matter T2 and FLAIR hyperintensity elsewhere is chronic and stable. The right deep gray matter nuclei, left thalamus, brainstem, and cerebellum remain normal for age. No midline shift, mass effect, evidence of mass lesion, ventriculomegaly, extra-axial collection or acute intracranial hemorrhage. Cervicomedullary junction and pituitary are  within normal limits. Vascular: Major intracranial vascular flow voids are preserved, the left ICA flow void was abnormal in 2018 and appears mildly improved today. Negative visible cervical spine.  Normal bone marrow signal. There is mild leftward gaze deviation. Orbits soft tissues otherwise appear normal. Paranasal Visualized paranasal sinuses and mastoids are stable and well pneumatized. Scalp and face soft tissues appear negative. MRA HEAD FINDINGS Antegrade flow in the posterior circulation with codominant distal vertebral arteries. Mild distal vertebral irregularity with no stenosis. Patent vertebrobasilar junction without stenosis. Dominant appearing AICA origins are patent. Mild basilar artery irregularity without stenosis. SCA origins are patent. Bilateral PCA origins appear normal. Posterior communicating arteries are diminutive or absent. Bilateral PCA branches are mildly irregular, and there is mild right PCA P2 segment stenosis which is stable from earlier today. Antegrade flow in both ICA siphons. Mild luminal narrowing and irregularity throughout the visible left ICA may reflect residual thrombus or plaque. The left ICA is patent to the left terminus. The left MCA and ACA origins are patent. There is mild to moderate irregularity at the left ACA origin and proximal A1 segment which is stable from earlier today. There is mild irregularity scratched at the left MCA and its branches now are patent. There is mild left M1 irregularity. The left MCA bifurcation is patent with no branch occlusion identified. Antegrade flow in the right ICA siphon which is only mildly irregular and without stenosis. Normal right ophthalmic artery origin. Normal right ICA terminus, right ACA and MCA origins. The anterior communicating artery  is normal. The visible ACA branches are stable, with mild irregularity of the distal left ACA. The right MCA M1 segment is patent with mild irregularity just proximal to the bifurcation  which is stable. The right MCA branches are stable and within normal limits. IMPRESSION: 1. Restored patency of the Left ICA, terminus, and Left MCA since earlier today. Mild residual irregularity throughout the left ICA siphon, and in the left M1 segment. Stable mild to moderate irregularity in the left A1 and distal A2 segments. 2. Associated core infarct primarily confined to the Left Basal Ganglia. There is a small 9 mm infarct in the left amygdala, and there are small number of scattered tiny cortical infarcts elsewhere in the Left MCA territory. 3. Mild cytotoxic edema with no associated hemorrhage or mass effect. 4. 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 Electronically Signed   By: Genevie Ann M.D.   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 Personal history of prior whole brain radiation for cancer, but no known brain metastases. EXAM: MRI HEAD WITHOUT CONTRAST MRA HEAD WITHOUT CONTRAST TECHNIQUE: Multiplanar, multiecho pulse sequences of the brain and surrounding structures were obtained without intravenous contrast. Angiographic images of the head were obtained using MRA technique without contrast. COMPARISON:  Cerebral angiogram, CTA head and neck, and noncontrast head CT earlier today. Prior brain MRI 11/19/2016. FINDINGS: MRI HEAD FINDINGS Brain: Restricted diffusion in the left basal ganglia (series 3, image 32) with T2 and FLAIR hyperintensity. No associated hemorrhage. No significant mass effect. There is a small 9 millimeter area of restricted diffusion in the medial left temporal lobe at the amygdala (series 3, image 20). There are otherwise a small number of tiny scattered foci of restricted diffusion in the left MCA territory (such as series 3, image 40). There  is no right hemisphere or posterior circulation restricted diffusion. Patchy and scattered bilateral cerebral white matter T2 and FLAIR hyperintensity elsewhere is chronic and stable. The right deep gray matter nuclei, left thalamus, brainstem, and cerebellum remain normal for age. No midline shift, mass effect, evidence of mass lesion, ventriculomegaly, extra-axial collection or acute intracranial hemorrhage. Cervicomedullary junction and pituitary are within normal limits. Vascular: Major intracranial vascular flow voids are preserved, the left ICA flow void was abnormal in 2018 and appears mildly improved today. Negative visible cervical spine.  Normal bone marrow signal. There is mild leftward gaze deviation. Orbits soft tissues otherwise appear normal. Paranasal Visualized paranasal sinuses and mastoids are stable and well pneumatized. Scalp and face soft tissues appear negative. MRA HEAD FINDINGS Antegrade flow in the posterior circulation with codominant distal vertebral arteries. Mild distal vertebral irregularity with no stenosis. Patent vertebrobasilar junction without stenosis. Dominant appearing AICA origins are patent. Mild basilar artery irregularity without stenosis. SCA origins are patent. Bilateral PCA origins appear normal. Posterior communicating arteries are diminutive or absent. Bilateral PCA branches are mildly irregular, and there is mild right PCA P2 segment stenosis which is stable from earlier today. Antegrade flow in both ICA siphons. Mild luminal narrowing and irregularity throughout the visible left ICA may reflect residual thrombus or plaque. The left ICA is patent to the left terminus. The left MCA and ACA origins are patent. There is mild to moderate irregularity at the left ACA origin and proximal A1 segment which is stable from  earlier today. There is mild irregularity scratched at the left MCA and its branches now are patent. There is mild left M1 irregularity. The left MCA  bifurcation is patent with no branch occlusion identified. Antegrade flow in the right ICA siphon which is only mildly irregular and without stenosis. Normal right ophthalmic artery origin. Normal right ICA terminus, right ACA and MCA origins. The anterior communicating artery is normal. The visible ACA branches are stable, with mild irregularity of the distal left ACA. The right MCA M1 segment is patent with mild irregularity just proximal to the bifurcation which is stable. The right MCA branches are stable and within normal limits. IMPRESSION: 1. Restored patency of the Left ICA, terminus, and Left MCA since earlier today. Mild residual irregularity throughout the left ICA siphon, and in the left M1 segment. Stable mild to moderate irregularity in the left A1 and distal A2 segments. 2. Associated core infarct primarily confined to the Left Basal Ganglia. There is a small 9 mm infarct in the left amygdala, and there are small number of scattered tiny cortical infarcts elsewhere in the Left MCA territory. 3. Mild cytotoxic edema with no associated hemorrhage or mass effect. 4. 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 Electronically Signed   By: Genevie Ann M.D.   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 The antibiotic was administered within 1 hour of the procedure 75 MCG NITROGLYCERIN INTRA ARTERIAL 650 mg aspirin via orogastric tube. Intra procedural initiation of Integrilin IV drip for dual anti-platelet therapy. ANESTHESIA/SEDATION: General endotracheal tube anesthesia with anesthesia team CONTRAST:  144 cc IV contrast FLUOROSCOPY TIME:  Fluoroscopy Time: 54 minutes 6 seconds (2,926 mGy). COMPLICATIONS: None TECHNIQUE: Informed written consent was obtained from the patient's family after a thorough discussion of the procedural risks, benefits and alternatives. Specific risks discussed include: Bleeding, infection, contrast reaction, kidney injury/failure, need for further procedure/surgery, arterial injury or dissection, embolization to new territory, intracranial hemorrhage (10-15% risk), neurologic deterioration, cardiopulmonary collapse, death. All questions were addressed. Maximal Sterile Barrier Technique was utilized including during the procedure including caps, mask, sterile gowns, sterile gloves, sterile drape, hand hygiene and skin antiseptic. A timeout was performed prior to the initiation of the procedure. FINDINGS: Initial angiogram: Left common carotid artery:  Normal course caliber and contour. Left external carotid artery: Patent with antegrade flow. On the initial injection before treatment, there is minimal opacification of the carotid siphon secondary to meningeal branches which appear to be region ating from ascending pharyngeal distribution and potentially deep meningeal branches from the external carotid artery. No significant filling of the MCA on the initial injection The initial injection does confirmed perfusion of the lenticulostriate vasculature, as well as a patent anterior coronal artery,  with minimal filling of temporal lobe. Anterior basal ganglia structures are perfused. Left internal carotid artery: Internal carotid artery is occluded at the origin with a string sign at the posterior aspect of the artery. Left MCA: Initial injection demonstrates no significant filling of the middle cerebral artery. 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 Left ACA: Anterior cerebral artery perfused by the right-sided flow at the initiation. Final angiogram: After treatment of the carotid occlusion and deployment of 8 mm-6 mm x 40 mm carotid stent, there is excellent flow through the cervical carotid to the intracranial carotid. After mechanical thrombectomy of M1 occlusion, there is modified treatment in cerebral ischemia of 3. Flat panel CT: No complications with small contrast stain of the left basal ganglia. PROCEDURE: Patient was brought to the neurointerventional suite, with the patient identity confirmed, and consent form confirmed. General anesthesia was present for initiation of general endotracheal tube anesthesia. Patient was prepped and draped in the usual sterile fashion. The only venous access was a port catheter with single needle access. Ultrasound survey of the right inguinal region was performed with images stored and sent to PACs. 11 blade scalpel was used to make a small incision. A micropuncture needle was used access the right common femoral artery under ultrasound. With excellent arterial blood flow returned, an .018 micro wire was passed through the needle, observed to enter the abdominal aorta under fluoroscopy. The needle was removed, and a micropuncture sheath was placed over the wire. The inner dilator and wire were removed, and an 035 Bentson wire was advanced under fluoroscopy into the abdominal aorta. The sheath was removed and a standard 5 [place] vascular sheath was placed. The dilator was removed and the sheath was  flushed. After the initial common femoral artery access, ultrasound survey of the left inguinal region was performed in order to place arterial monitoring line. Images stored and sent to PACs. Micropuncture needle was used access the left common femoral artery under ultrasound. With excellent arterial blood flow returned, an .018 micro wire was passed through the needle, observed to enter the abdominal aorta under fluoroscopy. The needle was removed, and a micropuncture sheath was placed over the wire. The inner dilator and wire were removed, and an 035 Bentson wire was advanced under fluoroscopy into the abdominal aorta. The sheath was removed and a standard 4 [place] vascular sheath was placed. The dilator was removed and the sheath was flushed. A 46F JB-1 diagnostic catheter was advanced over the wire through the right common femoral artery access to the proximal descending thoracic aorta. Wire was then removed. Double flush of the catheter was performed. Catheter was then used to select the left common carotid artery. Angiogram was performed. Using roadmap technique, the catheter was advanced over a roadrunner wire into right common carotid artery. Formal angiogram was performed. Catheter was advanced over the roadrunner wire into the external carotid artery. Wire was removed. Exchange length Rosen wire was then passed through the diagnostic catheter to the distal common carotid artery and the diagnostic catheter was removed. The 5 French sheath was removed and exchanged for 8 French 55 centimeter BrightTip sheath. Sheath was flushed and attached to pressurized and heparinized saline bag for constant forward flow. Then an 8 [place], 95 cm Flowgate balloon tip catheter was prepared on the back table with inflation of the balloon with 50/50 concentration of dilute contrast. The balloon catheter was then advanced over the wire, positioned into the distal  common carotid artery. Copious back flush was performed and the  balloon catheter was attached to heparinized and pressurized saline bag for forward flow. Roadmap was then performed at the carotid bifurcation. Combination of a rapid transit microwire and a synchro soft wire were used to navigate beyond the occlusion at the proximal internal carotid artery. The microcatheter was then navigated into the distal cervical segment, wire was removed, blood was aspirated, and a gentle injection confirmed a luminal position. Exchange length Transcend wire was then placed through the microcatheter into the cervical carotid. Microcatheter was removed. Balloon angioplasty was then performed with a rapid exchange system, using a Viatrac balloon 5 mm x 30 mm. The patient required treatment at this time with atropine dose, as we experienced bradycardia into the 30s. His heart rate recovered after treatment by the anesthesia team. Balloon was removed. Angiogram confirmed flow into the cervical carotid. A coaxial system of an Ace 64 penumbra aspiration catheter and the Trevo pro view 18 catheter was then advanced over the exchange length wire into the cervical carotid. Once the coaxial system was in the cervical carotid, the microwire was removed and exchanged for synchro soft. The synchro soft wire was navigated beyond the occlusion under roadmap guidance, and the aspiration catheter was advanced into the ICA terminus. The balloon guide catheter was then advanced into the mid segment of the cervical carotid artery. The microcatheter and microwire were then carefully advanced through the occluded segment. Microcatheter was then push through the occluded segment and the wire was removed. Blood was then aspirated through the hub of the microcatheter, and a gentle contrast injection was performed confirming intraluminal position. A rotating hemostatic valve was then attached to the back end of the microcatheter, and a pressurized and heparinized saline bag was attached to the catheter. 4 x 40  solitaire device was then selected. Back flush was achieved at the rotating hemostatic valve, and then the device was gently advanced through the microcatheter to the distal end. The retriever was then unsheathed by withdrawing the microcatheter under fluoroscopy. A 5 minutes interval was observed. At this time, ultrasound-guided access of the right common femoral vein was performed for placement of a additional venous access and placement of a 12 French triple-lumen IV catheter. We then proceeded with the mechanical thrombectomy. The balloon at the balloon tip catheter was then inflated under fluoroscopy for proximal flow arrest. Constant aspiration was then performed at the penumbra aspiration catheter at the carotid terminus as the retriever was gently and slowly withdrawn with fluoroscopic observation. Once the retriever was entirely removed from the system, free aspiration was confirmed at the hub of the aspiration catheter, with free blood return confirmed. The balloon was then deflated, rotating hemostatic valve was reattached, and a control angiogram was performed, confirming restoration of flow. Penumbra catheter was then withdrawn, and the angiogram was performed at the carotid bifurcation. Given the appearance, we elected to place acute carotid stent. The lesion was then again navigated with synchro soft wire and rapid transit. With the microwire microcatheter positioned in the distal cervical carotid, the microwire was removed. The exchange length Transcend wire was then replaced and the microcatheter was removed. Acute carotid stent was then deployed, 8 mm-6 mm x 40 mm length. Before deployment, we initiated Integrilin drip treatment for dual anti-platelet therapy. Angiogram was performed. Coaxial system of the penumbra aspiration catheter and the pro view 18 catheter were then navigated to the carotid siphon for better angiogram of the cerebral vasculature. Angiogram was  performed. Microcatheter, a  number aspiration catheter were withdrawn. The balloon guide catheter was positioned in the common carotid artery for final angiogram. All catheter and wires were removed. The sheath at the right common femoral artery access was withdrawn for an angiogram. Eight French Angio-Seal was then deployed for hemostasis. A flat panel CT was then performed.  Patient was then extubated. Estimated blood loss was 50 cc No complications encountered. IMPRESSION: Status post left-sided cerebral angiogram with treatment of left ICA/MCA tandem occlusion, requiring acute left carotid stenting and mechanical thrombectomy of a left M1 occlusion with achievement of mTICI-3 flow, and restoration of cervical ICA flow. Flat panel CT in the room demonstrates small contrast stain without complicating features, and the patient was extubated. Status post deployment of 8 French Angio-Seal for right common femoral artery hemostasis. Status post ultrasound-guided left common femoral artery access with a 4 French sheath for arterial monitoring, which remained after the case. Status post ultrasound-guided right common femoral vein access for additional IV access, which remained after the case transmitting the Integrilin drip. Signed, Dulcy Fanny. Earleen Newport, DO [facility] Radiology PLAN: Patient will go to the PACU ICU admission Bilateral hips will be straight until the left 4 French arterial monitoring sheath is removed and the right common femoral vein venous access sheath is removed. Routine wound management for the right common femoral artery sheath access. Frequent neurovascular checks. IV saline hydration for 8 hours given the renal insufficiency Blood pressure control management of 120-140 with nicardipine drip ordered The goal will be dual anti-platelet therapy, for now with dose of 650 mg aspirin and the Integrilin drip. Electronically Signed   By: Corrie Mckusick D.O.   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 The antibiotic was administered within 1 hour of the procedure 75 MCG NITROGLYCERIN INTRA ARTERIAL 650 mg aspirin via orogastric tube. Intra procedural initiation of Integrilin IV drip for dual anti-platelet therapy. ANESTHESIA/SEDATION: General endotracheal tube anesthesia with anesthesia team CONTRAST:  144 cc IV contrast FLUOROSCOPY TIME:  Fluoroscopy Time: 54 minutes 6 seconds (2,926 mGy). COMPLICATIONS: None TECHNIQUE: Informed written consent was obtained from the patient's family after a thorough discussion of the procedural risks, benefits and alternatives. Specific risks discussed include: Bleeding, infection, contrast reaction, kidney injury/failure, need for further procedure/surgery, arterial injury or dissection, embolization to new territory, intracranial hemorrhage (10-15% risk), neurologic deterioration, cardiopulmonary collapse, death. All questions were addressed. Maximal Sterile Barrier Technique was utilized including during the procedure including caps, mask, sterile gowns, sterile gloves, sterile drape, hand  hygiene and skin antiseptic. A timeout was performed prior to the initiation of the procedure. FINDINGS: Initial angiogram: Left common carotid artery:  Normal course caliber and contour. Left external carotid artery: Patent with  antegrade flow. On the initial injection before treatment, there is minimal opacification of the carotid siphon secondary to meningeal branches which appear to be region ating from ascending pharyngeal distribution and potentially deep meningeal branches from the external carotid artery. No significant filling of the MCA on the initial injection The initial injection does confirmed perfusion of the lenticulostriate vasculature, as well as a patent anterior coronal artery, with minimal filling of temporal lobe. Anterior basal ganglia structures are perfused. Left internal carotid artery: Internal carotid artery is occluded at the origin with a string sign at the posterior aspect of the artery. Left MCA: Initial injection demonstrates no significant filling of the middle cerebral artery. 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 Left ACA: Anterior cerebral artery perfused by the right-sided flow at the initiation. Final angiogram: After treatment of the carotid occlusion and deployment of 8 mm-6 mm x 40 mm carotid stent, there is excellent flow through the cervical carotid to the intracranial carotid. After mechanical thrombectomy of M1 occlusion, there is modified treatment in cerebral ischemia of 3. Flat panel CT: No complications with small contrast stain of the left basal ganglia. PROCEDURE: Patient was brought to the neurointerventional suite, with the patient identity confirmed, and consent form confirmed. General anesthesia was present for initiation of general endotracheal tube anesthesia. Patient was prepped and draped in the usual sterile fashion. The only venous access was a port catheter with single needle access.  Ultrasound survey of the right inguinal region was performed with images stored and sent to PACs. 11 blade scalpel was used to make a small incision. A micropuncture needle was used access the right common femoral artery under ultrasound. With excellent arterial blood flow returned, an .018 micro wire was passed through the needle, observed to enter the abdominal aorta under fluoroscopy. The needle was removed, and a micropuncture sheath was placed over the wire. The inner dilator and wire were removed, and an 035 Bentson wire was advanced under fluoroscopy into the abdominal aorta. The sheath was removed and a standard 5 [place] vascular sheath was placed. The dilator was removed and the sheath was flushed. After the initial common femoral artery access, ultrasound survey of the left inguinal region was performed in order to place arterial monitoring line. Images stored and sent to PACs. Micropuncture needle was used access the left common femoral artery under ultrasound. With excellent arterial blood flow returned, an .018 micro wire was passed through the needle, observed to enter the abdominal aorta under fluoroscopy. The needle was removed, and a micropuncture sheath was placed over the wire. The inner dilator and wire were removed, and an 035 Bentson wire was advanced under fluoroscopy into the abdominal aorta. The sheath was removed and a standard 4 [place] vascular sheath was placed. The dilator was removed and the sheath was flushed. A 50F JB-1 diagnostic catheter was advanced over the wire through the right common femoral artery access to the proximal descending thoracic aorta. Wire was then removed. Double flush of the catheter was performed. Catheter was then used to select the left common carotid artery. Angiogram was performed. Using roadmap technique, the catheter was advanced over a roadrunner wire into right common carotid artery. Formal angiogram was performed. Catheter was advanced over the  roadrunner wire into the external carotid artery. Wire was removed. Exchange length Rosen wire was then passed through the diagnostic catheter to the distal common carotid artery and the diagnostic catheter was removed. The  5 French sheath was removed and exchanged for 8 French 55 centimeter BrightTip sheath. Sheath was flushed and attached to pressurized and heparinized saline bag for constant forward flow. Then an 8 [place], 95 cm Flowgate balloon tip catheter was prepared on the back table with inflation of the balloon with 50/50 concentration of dilute contrast. The balloon catheter was then advanced over the wire, positioned into the distal common carotid artery. Copious back flush was performed and the balloon catheter was attached to heparinized and pressurized saline bag for forward flow. Roadmap was then performed at the carotid bifurcation. Combination of a rapid transit microwire and a synchro soft wire were used to navigate beyond the occlusion at the proximal internal carotid artery. The microcatheter was then navigated into the distal cervical segment, wire was removed, blood was aspirated, and a gentle injection confirmed a luminal position. Exchange length Transcend wire was then placed through the microcatheter into the cervical carotid. Microcatheter was removed. Balloon angioplasty was then performed with a rapid exchange system, using a Viatrac balloon 5 mm x 30 mm. The patient required treatment at this time with atropine dose, as we experienced bradycardia into the 30s. His heart rate recovered after treatment by the anesthesia team. Balloon was removed. Angiogram confirmed flow into the cervical carotid. A coaxial system of an Ace 64 penumbra aspiration catheter and the Trevo pro view 18 catheter was then advanced over the exchange length wire into the cervical carotid. Once the coaxial system was in the cervical carotid, the microwire was removed and exchanged for synchro soft. The synchro  soft wire was navigated beyond the occlusion under roadmap guidance, and the aspiration catheter was advanced into the ICA terminus. The balloon guide catheter was then advanced into the mid segment of the cervical carotid artery. The microcatheter and microwire were then carefully advanced through the occluded segment. Microcatheter was then push through the occluded segment and the wire was removed. Blood was then aspirated through the hub of the microcatheter, and a gentle contrast injection was performed confirming intraluminal position. A rotating hemostatic valve was then attached to the back end of the microcatheter, and a pressurized and heparinized saline bag was attached to the catheter. 4 x 40 solitaire device was then selected. Back flush was achieved at the rotating hemostatic valve, and then the device was gently advanced through the microcatheter to the distal end. The retriever was then unsheathed by withdrawing the microcatheter under fluoroscopy. A 5 minutes interval was observed. At this time, ultrasound-guided access of the right common femoral vein was performed for placement of a additional venous access and placement of a 12 French triple-lumen IV catheter. We then proceeded with the mechanical thrombectomy. The balloon at the balloon tip catheter was then inflated under fluoroscopy for proximal flow arrest. Constant aspiration was then performed at the penumbra aspiration catheter at the carotid terminus as the retriever was gently and slowly withdrawn with fluoroscopic observation. Once the retriever was entirely removed from the system, free aspiration was confirmed at the hub of the aspiration catheter, with free blood return confirmed. The balloon was then deflated, rotating hemostatic valve was reattached, and a control angiogram was performed, confirming restoration of flow. Penumbra catheter was then withdrawn, and the angiogram was performed at the carotid bifurcation. Given the  appearance, we elected to place acute carotid stent. The lesion was then again navigated with synchro soft wire and rapid transit. With the microwire microcatheter positioned in the distal cervical carotid, the microwire was removed.  The exchange length Transcend wire was then replaced and the microcatheter was removed. Acute carotid stent was then deployed, 8 mm-6 mm x 40 mm length. Before deployment, we initiated Integrilin drip treatment for dual anti-platelet therapy. Angiogram was performed. Coaxial system of the penumbra aspiration catheter and the pro view 18 catheter were then navigated to the carotid siphon for better angiogram of the cerebral vasculature. Angiogram was performed. Microcatheter, a number aspiration catheter were withdrawn. The balloon guide catheter was positioned in the common carotid artery for final angiogram. All catheter and wires were removed. The sheath at the right common femoral artery access was withdrawn for an angiogram. Eight French Angio-Seal was then deployed for hemostasis. A flat panel CT was then performed.  Patient was then extubated. Estimated blood loss was 50 cc No complications encountered. IMPRESSION: Status post left-sided cerebral angiogram with treatment of left ICA/MCA tandem occlusion, requiring acute left carotid stenting and mechanical thrombectomy of a left M1 occlusion with achievement of mTICI-3 flow, and restoration of cervical ICA flow. Flat panel CT in the room demonstrates small contrast stain without complicating features, and the patient was extubated. Status post deployment of 8 French Angio-Seal for right common femoral artery hemostasis. Status post ultrasound-guided left common femoral artery access with a 4 French sheath for arterial monitoring, which remained after the case. Status post ultrasound-guided right common femoral vein access for additional IV access, which remained after the case transmitting the Integrilin drip. Signed, Dulcy Fanny.  Earleen Newport, DO [facility] Radiology PLAN: Patient will go to the PACU ICU admission Bilateral hips will be straight until the left 4 French arterial monitoring sheath is removed and the right common femoral vein venous access sheath is removed. Routine wound management for the right common femoral artery sheath access. Frequent neurovascular checks. IV saline hydration for 8 hours given the renal insufficiency Blood pressure control management of 120-140 with nicardipine drip ordered The goal will be dual anti-platelet therapy, for now with dose of 650 mg aspirin and the Integrilin drip. Electronically Signed   By: Corrie Mckusick D.O.   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 The antibiotic was administered within 1 hour of the procedure 75 MCG NITROGLYCERIN INTRA ARTERIAL 650 mg aspirin via orogastric tube. Intra procedural initiation of Integrilin IV drip for dual anti-platelet therapy. ANESTHESIA/SEDATION: General endotracheal tube anesthesia with anesthesia team CONTRAST:  144 cc IV contrast FLUOROSCOPY TIME:   Fluoroscopy Time: 54 minutes 6 seconds (2,926 mGy). COMPLICATIONS: None TECHNIQUE: Informed written consent was obtained from the patient's family after a thorough discussion of the procedural risks, benefits and alternatives. Specific risks discussed include: Bleeding, infection, contrast reaction, kidney injury/failure, need for further procedure/surgery, arterial injury or dissection, embolization to new territory, intracranial hemorrhage (  10-15% risk), neurologic deterioration, cardiopulmonary collapse, death. All questions were addressed. Maximal Sterile Barrier Technique was utilized including during the procedure including caps, mask, sterile gowns, sterile gloves, sterile drape, hand hygiene and skin antiseptic. A timeout was performed prior to the initiation of the procedure. FINDINGS: Initial angiogram: Left common carotid artery:  Normal course caliber and contour. Left external carotid artery: Patent with antegrade flow. On the initial injection before treatment, there is minimal opacification of the carotid siphon secondary to meningeal branches which appear to be region ating from ascending pharyngeal distribution and potentially deep meningeal branches from the external carotid artery. No significant filling of the MCA on the initial injection The initial injection does confirmed perfusion of the lenticulostriate vasculature, as well as a patent anterior coronal artery, with minimal filling of temporal lobe. Anterior basal ganglia structures are perfused. Left internal carotid artery: Internal carotid artery is occluded at the origin with a string sign at the posterior aspect of the artery. Left MCA: Initial injection demonstrates no significant filling of the middle cerebral artery. 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 Left ACA: Anterior cerebral artery perfused by the right-sided flow at the initiation. Final angiogram: After  treatment of the carotid occlusion and deployment of 8 mm-6 mm x 40 mm carotid stent, there is excellent flow through the cervical carotid to the intracranial carotid. After mechanical thrombectomy of M1 occlusion, there is modified treatment in cerebral ischemia of 3. Flat panel CT: No complications with small contrast stain of the left basal ganglia. PROCEDURE: Patient was brought to the neurointerventional suite, with the patient identity confirmed, and consent form confirmed. General anesthesia was present for initiation of general endotracheal tube anesthesia. Patient was prepped and draped in the usual sterile fashion. The only venous access was a port catheter with single needle access. Ultrasound survey of the right inguinal region was performed with images stored and sent to PACs. 11 blade scalpel was used to make a small incision. A micropuncture needle was used access the right common femoral artery under ultrasound. With excellent arterial blood flow returned, an .018 micro wire was passed through the needle, observed to enter the abdominal aorta under fluoroscopy. The needle was removed, and a micropuncture sheath was placed over the wire. The inner dilator and wire were removed, and an 035 Bentson wire was advanced under fluoroscopy into the abdominal aorta. The sheath was removed and a standard 5 [place] vascular sheath was placed. The dilator was removed and the sheath was flushed. After the initial common femoral artery access, ultrasound survey of the left inguinal region was performed in order to place arterial monitoring line. Images stored and sent to PACs. Micropuncture needle was used access the left common femoral artery under ultrasound. With excellent arterial blood flow returned, an .018 micro wire was passed through the needle, observed to enter the abdominal aorta under fluoroscopy. The needle was removed, and a micropuncture sheath was placed over the wire. The inner dilator and wire  were removed, and an 035 Bentson wire was advanced under fluoroscopy into the abdominal aorta. The sheath was removed and a standard 4 [place] vascular sheath was placed. The dilator was removed and the sheath was flushed. A 39F JB-1 diagnostic catheter was advanced over the wire through the right common femoral artery access to the proximal descending thoracic aorta. Wire was then removed. Double flush of the catheter was performed. Catheter was then used to select the left  common carotid artery. Angiogram was performed. Using roadmap technique, the catheter was advanced over a roadrunner wire into right common carotid artery. Formal angiogram was performed. Catheter was advanced over the roadrunner wire into the external carotid artery. Wire was removed. Exchange length Rosen wire was then passed through the diagnostic catheter to the distal common carotid artery and the diagnostic catheter was removed. The 5 French sheath was removed and exchanged for 8 French 55 centimeter BrightTip sheath. Sheath was flushed and attached to pressurized and heparinized saline bag for constant forward flow. Then an 8 [place], 95 cm Flowgate balloon tip catheter was prepared on the back table with inflation of the balloon with 50/50 concentration of dilute contrast. The balloon catheter was then advanced over the wire, positioned into the distal common carotid artery. Copious back flush was performed and the balloon catheter was attached to heparinized and pressurized saline bag for forward flow. Roadmap was then performed at the carotid bifurcation. Combination of a rapid transit microwire and a synchro soft wire were used to navigate beyond the occlusion at the proximal internal carotid artery. The microcatheter was then navigated into the distal cervical segment, wire was removed, blood was aspirated, and a gentle injection confirmed a luminal position. Exchange length Transcend wire was then placed through the microcatheter into  the cervical carotid. Microcatheter was removed. Balloon angioplasty was then performed with a rapid exchange system, using a Viatrac balloon 5 mm x 30 mm. The patient required treatment at this time with atropine dose, as we experienced bradycardia into the 30s. His heart rate recovered after treatment by the anesthesia team. Balloon was removed. Angiogram confirmed flow into the cervical carotid. A coaxial system of an Ace 64 penumbra aspiration catheter and the Trevo pro view 18 catheter was then advanced over the exchange length wire into the cervical carotid. Once the coaxial system was in the cervical carotid, the microwire was removed and exchanged for synchro soft. The synchro soft wire was navigated beyond the occlusion under roadmap guidance, and the aspiration catheter was advanced into the ICA terminus. The balloon guide catheter was then advanced into the mid segment of the cervical carotid artery. The microcatheter and microwire were then carefully advanced through the occluded segment. Microcatheter was then push through the occluded segment and the wire was removed. Blood was then aspirated through the hub of the microcatheter, and a gentle contrast injection was performed confirming intraluminal position. A rotating hemostatic valve was then attached to the back end of the microcatheter, and a pressurized and heparinized saline bag was attached to the catheter. 4 x 40 solitaire device was then selected. Back flush was achieved at the rotating hemostatic valve, and then the device was gently advanced through the microcatheter to the distal end. The retriever was then unsheathed by withdrawing the microcatheter under fluoroscopy. A 5 minutes interval was observed. At this time, ultrasound-guided access of the right common femoral vein was performed for placement of a additional venous access and placement of a 12 French triple-lumen IV catheter. We then proceeded with the mechanical thrombectomy. The  balloon at the balloon tip catheter was then inflated under fluoroscopy for proximal flow arrest. Constant aspiration was then performed at the penumbra aspiration catheter at the carotid terminus as the retriever was gently and slowly withdrawn with fluoroscopic observation. Once the retriever was entirely removed from the system, free aspiration was confirmed at the hub of the aspiration catheter, with free blood return confirmed. The balloon was then deflated, rotating  hemostatic valve was reattached, and a control angiogram was performed, confirming restoration of flow. Penumbra catheter was then withdrawn, and the angiogram was performed at the carotid bifurcation. Given the appearance, we elected to place acute carotid stent. The lesion was then again navigated with synchro soft wire and rapid transit. With the microwire microcatheter positioned in the distal cervical carotid, the microwire was removed. The exchange length Transcend wire was then replaced and the microcatheter was removed. Acute carotid stent was then deployed, 8 mm-6 mm x 40 mm length. Before deployment, we initiated Integrilin drip treatment for dual anti-platelet therapy. Angiogram was performed. Coaxial system of the penumbra aspiration catheter and the pro view 18 catheter were then navigated to the carotid siphon for better angiogram of the cerebral vasculature. Angiogram was performed. Microcatheter, a number aspiration catheter were withdrawn. The balloon guide catheter was positioned in the common carotid artery for final angiogram. All catheter and wires were removed. The sheath at the right common femoral artery access was withdrawn for an angiogram. Eight French Angio-Seal was then deployed for hemostasis. A flat panel CT was then performed.  Patient was then extubated. Estimated blood loss was 50 cc No complications encountered. IMPRESSION: Status post left-sided cerebral angiogram with treatment of left ICA/MCA tandem occlusion,  requiring acute left carotid stenting and mechanical thrombectomy of a left M1 occlusion with achievement of mTICI-3 flow, and restoration of cervical ICA flow. Flat panel CT in the room demonstrates small contrast stain without complicating features, and the patient was extubated. Status post deployment of 8 French Angio-Seal for right common femoral artery hemostasis. Status post ultrasound-guided left common femoral artery access with a 4 French sheath for arterial monitoring, which remained after the case. Status post ultrasound-guided right common femoral vein access for additional IV access, which remained after the case transmitting the Integrilin drip. Signed, Dulcy Fanny. Earleen Newport, DO [facility] Radiology PLAN: Patient will go to the PACU ICU admission Bilateral hips will be straight until the left 4 French arterial monitoring sheath is removed and the right common femoral vein venous access sheath is removed. Routine wound management for the right common femoral artery sheath access. Frequent neurovascular checks. IV saline hydration for 8 hours given the renal insufficiency Blood pressure control management of 120-140 with nicardipine drip ordered The goal will be dual anti-platelet therapy, for now with dose of 650 mg aspirin and the Integrilin drip. Electronically Signed   By: Corrie Mckusick D.O.   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 The antibiotic was administered within 1 hour of the procedure 75 MCG NITROGLYCERIN INTRA ARTERIAL 650 mg aspirin via orogastric tube. Intra procedural initiation of Integrilin IV drip for dual anti-platelet therapy. ANESTHESIA/SEDATION: General endotracheal tube anesthesia with anesthesia  team CONTRAST:  144 cc IV contrast FLUOROSCOPY TIME:  Fluoroscopy Time: 54 minutes 6 seconds (2,926 mGy). COMPLICATIONS: None TECHNIQUE: Informed written consent was obtained from the patient's family after a thorough discussion of the procedural risks, benefits and alternatives. Specific risks discussed include: Bleeding, infection, contrast reaction, kidney injury/failure, need for further procedure/surgery, arterial injury or dissection, embolization to new territory, intracranial hemorrhage (10-15% risk), neurologic deterioration, cardiopulmonary collapse, death. All questions were addressed. Maximal Sterile Barrier Technique was utilized including during the procedure including caps, mask, sterile gowns, sterile gloves, sterile drape, hand hygiene and skin antiseptic. A timeout was performed prior to the initiation of the procedure. FINDINGS: Initial angiogram: Left common carotid artery:  Normal course caliber and contour. Left external carotid artery: Patent with antegrade flow. On the initial injection before treatment, there is minimal opacification of the carotid siphon secondary to meningeal branches which appear to be region ating from ascending pharyngeal distribution and potentially deep meningeal branches from the external carotid artery. No significant filling of the MCA on the initial  injection The initial injection does confirmed perfusion of the lenticulostriate vasculature, as well as a patent anterior coronal artery, with minimal filling of temporal lobe. Anterior basal ganglia structures are perfused. Left internal carotid artery: Internal carotid artery is occluded at the origin with a string sign at the posterior aspect of the artery. Left MCA: Initial injection demonstrates no significant filling of the middle cerebral artery. 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 Left ACA: Anterior cerebral artery perfused by the right-sided flow at the initiation. Final angiogram: After treatment of the carotid occlusion and deployment of 8 mm-6 mm x 40 mm carotid stent, there is excellent flow through the cervical carotid to the intracranial carotid. After mechanical thrombectomy of M1 occlusion, there is modified treatment in cerebral ischemia of 3. Flat panel CT: No complications with small contrast stain of the left basal ganglia. PROCEDURE: Patient was brought to the neurointerventional suite, with the patient identity confirmed, and consent form confirmed. General anesthesia was present for initiation of general endotracheal tube anesthesia. Patient was prepped and draped in the usual sterile fashion. The only venous access was a port catheter with single needle access. Ultrasound survey of the right inguinal region was performed with images stored and sent to PACs. 11 blade scalpel was used to make a small incision. A micropuncture needle was used access the right common femoral artery under ultrasound. With excellent arterial blood flow returned, an .018 micro wire was passed through the needle, observed to enter the abdominal aorta under fluoroscopy. The needle was removed, and a micropuncture sheath was placed over the wire. The inner dilator and wire were removed, and an 035 Bentson wire was advanced under fluoroscopy into the  abdominal aorta. The sheath was removed and a standard 5 [place] vascular sheath was placed. The dilator was removed and the sheath was flushed. After the initial common femoral artery access, ultrasound survey of the left inguinal region was performed in order to place arterial monitoring line. Images stored and sent to PACs. Micropuncture needle was used access the left common femoral artery under ultrasound. With excellent arterial blood flow returned, an .018 micro wire was passed through the needle, observed to enter the abdominal aorta under fluoroscopy. The needle was removed, and a micropuncture sheath was placed over the wire. The inner dilator and wire were removed, and an 035 Bentson wire was advanced under fluoroscopy into the abdominal  aorta. The sheath was removed and a standard 4 [place] vascular sheath was placed. The dilator was removed and the sheath was flushed. A 54F JB-1 diagnostic catheter was advanced over the wire through the right common femoral artery access to the proximal descending thoracic aorta. Wire was then removed. Double flush of the catheter was performed. Catheter was then used to select the left common carotid artery. Angiogram was performed. Using roadmap technique, the catheter was advanced over a roadrunner wire into right common carotid artery. Formal angiogram was performed. Catheter was advanced over the roadrunner wire into the external carotid artery. Wire was removed. Exchange length Rosen wire was then passed through the diagnostic catheter to the distal common carotid artery and the diagnostic catheter was removed. The 5 French sheath was removed and exchanged for 8 French 55 centimeter BrightTip sheath. Sheath was flushed and attached to pressurized and heparinized saline bag for constant forward flow. Then an 8 [place], 95 cm Flowgate balloon tip catheter was prepared on the back table with inflation of the balloon with 50/50 concentration of dilute contrast. The  balloon catheter was then advanced over the wire, positioned into the distal common carotid artery. Copious back flush was performed and the balloon catheter was attached to heparinized and pressurized saline bag for forward flow. Roadmap was then performed at the carotid bifurcation. Combination of a rapid transit microwire and a synchro soft wire were used to navigate beyond the occlusion at the proximal internal carotid artery. The microcatheter was then navigated into the distal cervical segment, wire was removed, blood was aspirated, and a gentle injection confirmed a luminal position. Exchange length Transcend wire was then placed through the microcatheter into the cervical carotid. Microcatheter was removed. Balloon angioplasty was then performed with a rapid exchange system, using a Viatrac balloon 5 mm x 30 mm. The patient required treatment at this time with atropine dose, as we experienced bradycardia into the 30s. His heart rate recovered after treatment by the anesthesia team. Balloon was removed. Angiogram confirmed flow into the cervical carotid. A coaxial system of an Ace 64 penumbra aspiration catheter and the Trevo pro view 18 catheter was then advanced over the exchange length wire into the cervical carotid. Once the coaxial system was in the cervical carotid, the microwire was removed and exchanged for synchro soft. The synchro soft wire was navigated beyond the occlusion under roadmap guidance, and the aspiration catheter was advanced into the ICA terminus. The balloon guide catheter was then advanced into the mid segment of the cervical carotid artery. The microcatheter and microwire were then carefully advanced through the occluded segment. Microcatheter was then push through the occluded segment and the wire was removed. Blood was then aspirated through the hub of the microcatheter, and a gentle contrast injection was performed confirming intraluminal position. A rotating hemostatic valve was  then attached to the back end of the microcatheter, and a pressurized and heparinized saline bag was attached to the catheter. 4 x 40 solitaire device was then selected. Back flush was achieved at the rotating hemostatic valve, and then the device was gently advanced through the microcatheter to the distal end. The retriever was then unsheathed by withdrawing the microcatheter under fluoroscopy. A 5 minutes interval was observed. At this time, ultrasound-guided access of the right common femoral vein was performed for placement of a additional venous access and placement of a 12 French triple-lumen IV catheter. We then proceeded with the mechanical thrombectomy. The balloon at the balloon tip catheter  was then inflated under fluoroscopy for proximal flow arrest. Constant aspiration was then performed at the penumbra aspiration catheter at the carotid terminus as the retriever was gently and slowly withdrawn with fluoroscopic observation. Once the retriever was entirely removed from the system, free aspiration was confirmed at the hub of the aspiration catheter, with free blood return confirmed. The balloon was then deflated, rotating hemostatic valve was reattached, and a control angiogram was performed, confirming restoration of flow. Penumbra catheter was then withdrawn, and the angiogram was performed at the carotid bifurcation. Given the appearance, we elected to place acute carotid stent. The lesion was then again navigated with synchro soft wire and rapid transit. With the microwire microcatheter positioned in the distal cervical carotid, the microwire was removed. The exchange length Transcend wire was then replaced and the microcatheter was removed. Acute carotid stent was then deployed, 8 mm-6 mm x 40 mm length. Before deployment, we initiated Integrilin drip treatment for dual anti-platelet therapy. Angiogram was performed. Coaxial system of the penumbra aspiration catheter and the pro view 18 catheter  were then navigated to the carotid siphon for better angiogram of the cerebral vasculature. Angiogram was performed. Microcatheter, a number aspiration catheter were withdrawn. The balloon guide catheter was positioned in the common carotid artery for final angiogram. All catheter and wires were removed. The sheath at the right common femoral artery access was withdrawn for an angiogram. Eight French Angio-Seal was then deployed for hemostasis. A flat panel CT was then performed.  Patient was then extubated. Estimated blood loss was 50 cc No complications encountered. IMPRESSION: Status post left-sided cerebral angiogram with treatment of left ICA/MCA tandem occlusion, requiring acute left carotid stenting and mechanical thrombectomy of a left M1 occlusion with achievement of mTICI-3 flow, and restoration of cervical ICA flow. Flat panel CT in the room demonstrates small contrast stain without complicating features, and the patient was extubated. Status post deployment of 8 French Angio-Seal for right common femoral artery hemostasis. Status post ultrasound-guided left common femoral artery access with a 4 French sheath for arterial monitoring, which remained after the case. Status post ultrasound-guided right common femoral vein access for additional IV access, which remained after the case transmitting the Integrilin drip. Signed, Dulcy Fanny. Earleen Newport, DO [facility] Radiology PLAN: Patient will go to the PACU ICU admission Bilateral hips will be straight until the left 4 French arterial monitoring sheath is removed and the right common femoral vein venous access sheath is removed. Routine wound management for the right common femoral artery sheath access. Frequent neurovascular checks. IV saline hydration for 8 hours given the renal insufficiency Blood pressure control management of 120-140 with nicardipine drip ordered The goal will be dual anti-platelet therapy, for  now with dose of 650 mg aspirin and the Integrilin drip. Electronically Signed   By: Corrie Mckusick D.O.   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 [employer] health stroke scale of 8 with aspects score 10 EXAM: ULTRASOUND GUIDED ACCESS RIGHT COMMON FEMORAL ARTERY ULTRASOUND GUIDED ACCESS LEFT COMMON FEMORAL ARTERY FOR ARTERIAL MONITORING ULTRASOUND GUIDED ACCESS RIGHT COMMON FEMORAL VEIN FOR INTRA PROCEDURAL IV ACCESS CEREBRAL ANGIOGRAM REVASCULARIZATION OF ACUTE OCCLUSION LEFT ICA WITH BALLOON ANGIOPLASTY AND ACUTE CAROTID STENTING WITHOUT EMBOLIC PROTECTION MECHANICAL THROMBECTOMY OF EMERGENT LARGE VESSEL OCCLUSION OF  LEFT M1 SEGMENT DEPLOYMENT OF 8 FRENCH ANGIO-SEAL FOR RIGHT COMMON FEMORAL ARTERY HEMOSTASIS COMPARISON:  CT AND CT ANGIOGRAM 05/06/2017 MEDICATIONS: Vancomycin 1 gm IV. The antibiotic was administered within 1 hour of the procedure 75 MCG NITROGLYCERIN INTRA ARTERIAL 650 mg aspirin via orogastric tube. Intra procedural initiation of Integrilin IV drip for dual anti-platelet therapy. ANESTHESIA/SEDATION: General endotracheal tube anesthesia with anesthesia team CONTRAST:  144 cc IV contrast FLUOROSCOPY TIME:  Fluoroscopy Time: 54 minutes 6 seconds (2,926 mGy). COMPLICATIONS: None TECHNIQUE: Informed written consent was obtained from the patient's family after a thorough discussion of the procedural risks, benefits and alternatives. Specific risks discussed include: Bleeding, infection, contrast reaction, kidney injury/failure, need for further procedure/surgery, arterial injury or dissection, embolization to new territory, intracranial hemorrhage (10-15% risk), neurologic deterioration, cardiopulmonary collapse, death. All questions were addressed. Maximal Sterile Barrier Technique was  utilized including during the procedure including caps, mask, sterile gowns, sterile gloves, sterile drape, hand hygiene and skin antiseptic. A timeout was performed prior to the initiation of the procedure. FINDINGS: Initial angiogram: Left common carotid artery:  Normal course caliber and contour. Left external carotid artery: Patent with antegrade flow. On the initial injection before treatment, there is minimal opacification of the carotid siphon secondary to meningeal branches which appear to be region ating from ascending pharyngeal distribution and potentially deep meningeal branches from the external carotid artery. No significant filling of the MCA on the initial injection The initial injection does confirmed perfusion of the lenticulostriate vasculature, as well as a patent anterior coronal artery, with minimal filling of temporal lobe. Anterior basal ganglia structures are perfused. Left internal carotid artery: Internal carotid artery is occluded at the origin with a string sign at the posterior aspect of the artery. Left MCA: Initial injection demonstrates no significant filling of the middle cerebral artery. 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 Left ACA: Anterior cerebral artery perfused by the right-sided flow at the initiation. Final angiogram: After treatment of the carotid occlusion and deployment of 8 mm-6 mm x 40 mm carotid stent, there is excellent flow through the cervical carotid to the intracranial carotid. After mechanical thrombectomy of M1 occlusion, there is modified treatment in cerebral ischemia of 3. Flat panel CT: No complications with small contrast stain of the left basal ganglia. PROCEDURE: Patient was brought to the neurointerventional suite, with the patient identity confirmed, and consent form confirmed. General anesthesia was present for initiation of general endotracheal tube anesthesia. Patient was prepped and  draped in the usual sterile fashion. The only venous access was a port catheter with single needle access. Ultrasound survey of the right inguinal region was performed with images stored and sent to PACs. 11 blade scalpel was used to make a small incision. A micropuncture needle was used access the right common femoral artery under ultrasound. With excellent arterial blood flow returned, an .018 micro wire was passed through the needle, observed to enter the abdominal aorta under fluoroscopy. The needle was removed, and a micropuncture sheath was placed over the wire. The inner dilator and wire were removed, and an 035 Bentson wire was advanced under fluoroscopy into the abdominal aorta. The sheath was removed and a standard 5 [place] vascular sheath was placed. The dilator was removed and the sheath was flushed. After the initial common femoral artery access, ultrasound survey of the left inguinal region was performed in order to place arterial monitoring line. Images stored and sent to PACs. Micropuncture needle  was used access the left common femoral artery under ultrasound. With excellent arterial blood flow returned, an .018 micro wire was passed through the needle, observed to enter the abdominal aorta under fluoroscopy. The needle was removed, and a micropuncture sheath was placed over the wire. The inner dilator and wire were removed, and an 035 Bentson wire was advanced under fluoroscopy into the abdominal aorta. The sheath was removed and a standard 4 [place] vascular sheath was placed. The dilator was removed and the sheath was flushed. A 56F JB-1 diagnostic catheter was advanced over the wire through the right common femoral artery access to the proximal descending thoracic aorta. Wire was then removed. Double flush of the catheter was performed. Catheter was then used to select the left common carotid artery. Angiogram was performed. Using roadmap technique, the catheter was advanced over a roadrunner wire  into right common carotid artery. Formal angiogram was performed. Catheter was advanced over the roadrunner wire into the external carotid artery. Wire was removed. Exchange length Rosen wire was then passed through the diagnostic catheter to the distal common carotid artery and the diagnostic catheter was removed. The 5 French sheath was removed and exchanged for 8 French 55 centimeter BrightTip sheath. Sheath was flushed and attached to pressurized and heparinized saline bag for constant forward flow. Then an 8 [place], 95 cm Flowgate balloon tip catheter was prepared on the back table with inflation of the balloon with 50/50 concentration of dilute contrast. The balloon catheter was then advanced over the wire, positioned into the distal common carotid artery. Copious back flush was performed and the balloon catheter was attached to heparinized and pressurized saline bag for forward flow. Roadmap was then performed at the carotid bifurcation. Combination of a rapid transit microwire and a synchro soft wire were used to navigate beyond the occlusion at the proximal internal carotid artery. The microcatheter was then navigated into the distal cervical segment, wire was removed, blood was aspirated, and a gentle injection confirmed a luminal position. Exchange length Transcend wire was then placed through the microcatheter into the cervical carotid. Microcatheter was removed. Balloon angioplasty was then performed with a rapid exchange system, using a Viatrac balloon 5 mm x 30 mm. The patient required treatment at this time with atropine dose, as we experienced bradycardia into the 30s. His heart rate recovered after treatment by the anesthesia team. Balloon was removed. Angiogram confirmed flow into the cervical carotid. A coaxial system of an Ace 64 penumbra aspiration catheter and the Trevo pro view 18 catheter was then advanced over the exchange length wire into the cervical carotid. Once the coaxial system was  in the cervical carotid, the microwire was removed and exchanged for synchro soft. The synchro soft wire was navigated beyond the occlusion under roadmap guidance, and the aspiration catheter was advanced into the ICA terminus. The balloon guide catheter was then advanced into the mid segment of the cervical carotid artery. The microcatheter and microwire were then carefully advanced through the occluded segment. Microcatheter was then push through the occluded segment and the wire was removed. Blood was then aspirated through the hub of the microcatheter, and a gentle contrast injection was performed confirming intraluminal position. A rotating hemostatic valve was then attached to the back end of the microcatheter, and a pressurized and heparinized saline bag was attached to the catheter. 4 x 40 solitaire device was then selected. Back flush was achieved at the rotating hemostatic valve, and then the device was gently advanced through the  microcatheter to the distal end. The retriever was then unsheathed by withdrawing the microcatheter under fluoroscopy. A 5 minutes interval was observed. At this time, ultrasound-guided access of the right common femoral vein was performed for placement of a additional venous access and placement of a 12 French triple-lumen IV catheter. We then proceeded with the mechanical thrombectomy. The balloon at the balloon tip catheter was then inflated under fluoroscopy for proximal flow arrest. Constant aspiration was then performed at the penumbra aspiration catheter at the carotid terminus as the retriever was gently and slowly withdrawn with fluoroscopic observation. Once the retriever was entirely removed from the system, free aspiration was confirmed at the hub of the aspiration catheter, with free blood return confirmed. The balloon was then deflated, rotating hemostatic valve was reattached, and a control angiogram was performed, confirming restoration of flow. Penumbra catheter  was then withdrawn, and the angiogram was performed at the carotid bifurcation. Given the appearance, we elected to place acute carotid stent. The lesion was then again navigated with synchro soft wire and rapid transit. With the microwire microcatheter positioned in the distal cervical carotid, the microwire was removed. The exchange length Transcend wire was then replaced and the microcatheter was removed. Acute carotid stent was then deployed, 8 mm-6 mm x 40 mm length. Before deployment, we initiated Integrilin drip treatment for dual anti-platelet therapy. Angiogram was performed. Coaxial system of the penumbra aspiration catheter and the pro view 18 catheter were then navigated to the carotid siphon for better angiogram of the cerebral vasculature. Angiogram was performed. Microcatheter, a number aspiration catheter were withdrawn. The balloon guide catheter was positioned in the common carotid artery for final angiogram. All catheter and wires were removed. The sheath at the right common femoral artery access was withdrawn for an angiogram. Eight French Angio-Seal was then deployed for hemostasis. A flat panel CT was then performed.  Patient was then extubated. Estimated blood loss was 50 cc No complications encountered. IMPRESSION: Status post left-sided cerebral angiogram with treatment of left ICA/MCA tandem occlusion, requiring acute left carotid stenting and mechanical thrombectomy of a left M1 occlusion with achievement of mTICI-3 flow, and restoration of cervical ICA flow. Flat panel CT in the room demonstrates small contrast stain without complicating features, and the patient was extubated. Status post deployment of 8 French Angio-Seal for right common femoral artery hemostasis. Status post ultrasound-guided left common femoral artery access with a 4 French sheath for arterial monitoring, which remained after the case. Status post ultrasound-guided right common femoral vein access for additional IV  access, which remained after the case transmitting the Integrilin drip. Signed, Dulcy Fanny. Earleen Newport, DO [facility] Radiology PLAN: Patient will go to the PACU ICU admission Bilateral hips will be straight until the left 4 French arterial monitoring sheath is removed and the right common femoral vein venous access sheath is removed. Routine wound management for the right common femoral artery sheath access. Frequent neurovascular checks. IV saline hydration for 8 hours given the renal insufficiency Blood pressure control management of 120-140 with nicardipine drip ordered The goal will be dual anti-platelet therapy, for now with dose of 650 mg aspirin and the Integrilin drip. Electronically Signed   By: Corrie Mckusick D.O.   On: 05/06/2017 09:24   Dg Chest Port 1 View  Result Date: 05/06/2017 CLINICAL DATA:  Shortness of breath. Patient status post left MCA thrombectomy and left ICA stent placement today. EXAM: PORTABLE CHEST 1 VIEW COMPARISON:  PA and lateral  chest 03/18/2017.  CT chest 03/24/2017. FINDINGS: Interstitial prominence about the patient's left upper lobe pulmonary mass is worse than on the prior plain films of the chest. Mild basilar atelectasis is noted. Heart size is normal. Port-A-Cath is in place. Aortic atherosclerosis is seen. IMPRESSION: Increased interstitial opacities about the patient's left upper lobe pulmonary mass are nonspecific and could be due to tumor spread, postobstructive pneumonitis or less likely hemorrhage. Atherosclerosis. Electronically Signed   By: Inge Rise M.D.   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 [employer] health stroke scale of 8 with aspects score 10 EXAM:  ULTRASOUND GUIDED ACCESS RIGHT COMMON FEMORAL ARTERY ULTRASOUND GUIDED ACCESS LEFT COMMON FEMORAL ARTERY FOR ARTERIAL MONITORING ULTRASOUND GUIDED ACCESS RIGHT COMMON FEMORAL VEIN FOR INTRA PROCEDURAL IV ACCESS CEREBRAL ANGIOGRAM REVASCULARIZATION OF ACUTE OCCLUSION LEFT ICA WITH BALLOON ANGIOPLASTY AND ACUTE CAROTID STENTING WITHOUT EMBOLIC PROTECTION MECHANICAL THROMBECTOMY OF EMERGENT LARGE VESSEL OCCLUSION OF LEFT M1 SEGMENT DEPLOYMENT OF 8 FRENCH ANGIO-SEAL FOR RIGHT COMMON FEMORAL ARTERY HEMOSTASIS COMPARISON:  CT AND CT ANGIOGRAM 05/06/2017 MEDICATIONS: Vancomycin 1 gm IV. The antibiotic was administered within 1 hour of the procedure 75 MCG NITROGLYCERIN INTRA ARTERIAL 650 mg aspirin via orogastric tube. Intra procedural initiation of Integrilin IV drip for dual anti-platelet therapy. ANESTHESIA/SEDATION: General endotracheal tube anesthesia with anesthesia team CONTRAST:  144 cc IV contrast FLUOROSCOPY TIME:  Fluoroscopy Time: 54 minutes 6 seconds (2,926 mGy). COMPLICATIONS: None TECHNIQUE: Informed written consent was obtained from the patient's family after a thorough discussion of the procedural risks, benefits and alternatives. Specific risks discussed include: Bleeding, infection, contrast reaction, kidney injury/failure, need for further procedure/surgery, arterial injury or dissection, embolization to new territory, intracranial hemorrhage (10-15% risk), neurologic deterioration, cardiopulmonary collapse, death. All questions were addressed. Maximal Sterile Barrier Technique was utilized including during the procedure including caps, mask, sterile gowns, sterile gloves, sterile drape, hand hygiene and skin antiseptic. A timeout was performed prior to the initiation of the procedure. FINDINGS: Initial angiogram: Left common carotid artery:  Normal course caliber and contour. Left external carotid artery: Patent with antegrade flow. On the initial injection before treatment, there is minimal  opacification of the carotid siphon secondary to meningeal branches which appear to be region ating from ascending pharyngeal distribution and potentially deep meningeal branches from the external carotid artery. No significant filling of the MCA on the initial injection The initial injection does confirmed perfusion of the lenticulostriate vasculature, as well as a patent anterior coronal artery, with minimal filling of temporal lobe. Anterior basal ganglia structures are perfused. Left internal carotid artery: Internal carotid artery is occluded at the origin with a string sign at the posterior aspect of the artery. Left MCA: Initial injection demonstrates no significant filling of the middle cerebral artery. 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 Left ACA: Anterior cerebral artery perfused by the right-sided flow at the initiation. Final angiogram: After treatment of the carotid occlusion and deployment of 8 mm-6 mm x 40 mm carotid stent, there is excellent flow through the cervical carotid to the intracranial carotid. After mechanical thrombectomy of M1 occlusion, there is modified treatment in cerebral ischemia of 3. Flat panel CT: No complications with small contrast stain of the left basal ganglia. PROCEDURE: Patient was brought to  the neurointerventional suite, with the patient identity confirmed, and consent form confirmed. General anesthesia was present for initiation of general endotracheal tube anesthesia. Patient was prepped and draped in the usual sterile fashion. The only venous access was a port catheter with single needle access. Ultrasound survey of the right inguinal region was performed with images stored and sent to PACs. 11 blade scalpel was used to make a small incision. A micropuncture needle was used access the right common femoral artery under ultrasound. With excellent arterial blood flow returned, an .018 micro wire was  passed through the needle, observed to enter the abdominal aorta under fluoroscopy. The needle was removed, and a micropuncture sheath was placed over the wire. The inner dilator and wire were removed, and an 035 Bentson wire was advanced under fluoroscopy into the abdominal aorta. The sheath was removed and a standard 5 [place] vascular sheath was placed. The dilator was removed and the sheath was flushed. After the initial common femoral artery access, ultrasound survey of the left inguinal region was performed in order to place arterial monitoring line. Images stored and sent to PACs. Micropuncture needle was used access the left common femoral artery under ultrasound. With excellent arterial blood flow returned, an .018 micro wire was passed through the needle, observed to enter the abdominal aorta under fluoroscopy. The needle was removed, and a micropuncture sheath was placed over the wire. The inner dilator and wire were removed, and an 035 Bentson wire was advanced under fluoroscopy into the abdominal aorta. The sheath was removed and a standard 4 [place] vascular sheath was placed. The dilator was removed and the sheath was flushed. A 87F JB-1 diagnostic catheter was advanced over the wire through the right common femoral artery access to the proximal descending thoracic aorta. Wire was then removed. Double flush of the catheter was performed. Catheter was then used to select the left common carotid artery. Angiogram was performed. Using roadmap technique, the catheter was advanced over a roadrunner wire into right common carotid artery. Formal angiogram was performed. Catheter was advanced over the roadrunner wire into the external carotid artery. Wire was removed. Exchange length Rosen wire was then passed through the diagnostic catheter to the distal common carotid artery and the diagnostic catheter was removed. The 5 French sheath was removed and exchanged for 8 French 55 centimeter BrightTip sheath.  Sheath was flushed and attached to pressurized and heparinized saline bag for constant forward flow. Then an 8 [place], 95 cm Flowgate balloon tip catheter was prepared on the back table with inflation of the balloon with 50/50 concentration of dilute contrast. The balloon catheter was then advanced over the wire, positioned into the distal common carotid artery. Copious back flush was performed and the balloon catheter was attached to heparinized and pressurized saline bag for forward flow. Roadmap was then performed at the carotid bifurcation. Combination of a rapid transit microwire and a synchro soft wire were used to navigate beyond the occlusion at the proximal internal carotid artery. The microcatheter was then navigated into the distal cervical segment, wire was removed, blood was aspirated, and a gentle injection confirmed a luminal position. Exchange length Transcend wire was then placed through the microcatheter into the cervical carotid. Microcatheter was removed. Balloon angioplasty was then performed with a rapid exchange system, using a Viatrac balloon 5 mm x 30 mm. The patient required treatment at this time with atropine dose, as we experienced bradycardia into the 30s. His heart rate recovered after treatment by the anesthesia  team. Balloon was removed. Angiogram confirmed flow into the cervical carotid. A coaxial system of an Ace 64 penumbra aspiration catheter and the Trevo pro view 18 catheter was then advanced over the exchange length wire into the cervical carotid. Once the coaxial system was in the cervical carotid, the microwire was removed and exchanged for synchro soft. The synchro soft wire was navigated beyond the occlusion under roadmap guidance, and the aspiration catheter was advanced into the ICA terminus. The balloon guide catheter was then advanced into the mid segment of the cervical carotid artery. The microcatheter and microwire were then carefully advanced through the occluded  segment. Microcatheter was then push through the occluded segment and the wire was removed. Blood was then aspirated through the hub of the microcatheter, and a gentle contrast injection was performed confirming intraluminal position. A rotating hemostatic valve was then attached to the back end of the microcatheter, and a pressurized and heparinized saline bag was attached to the catheter. 4 x 40 solitaire device was then selected. Back flush was achieved at the rotating hemostatic valve, and then the device was gently advanced through the microcatheter to the distal end. The retriever was then unsheathed by withdrawing the microcatheter under fluoroscopy. A 5 minutes interval was observed. At this time, ultrasound-guided access of the right common femoral vein was performed for placement of a additional venous access and placement of a 12 French triple-lumen IV catheter. We then proceeded with the mechanical thrombectomy. The balloon at the balloon tip catheter was then inflated under fluoroscopy for proximal flow arrest. Constant aspiration was then performed at the penumbra aspiration catheter at the carotid terminus as the retriever was gently and slowly withdrawn with fluoroscopic observation. Once the retriever was entirely removed from the system, free aspiration was confirmed at the hub of the aspiration catheter, with free blood return confirmed. The balloon was then deflated, rotating hemostatic valve was reattached, and a control angiogram was performed, confirming restoration of flow. Penumbra catheter was then withdrawn, and the angiogram was performed at the carotid bifurcation. Given the appearance, we elected to place acute carotid stent. The lesion was then again navigated with synchro soft wire and rapid transit. With the microwire microcatheter positioned in the distal cervical carotid, the microwire was removed. The exchange length Transcend wire was then replaced and the microcatheter was  removed. Acute carotid stent was then deployed, 8 mm-6 mm x 40 mm length. Before deployment, we initiated Integrilin drip treatment for dual anti-platelet therapy. Angiogram was performed. Coaxial system of the penumbra aspiration catheter and the pro view 18 catheter were then navigated to the carotid siphon for better angiogram of the cerebral vasculature. Angiogram was performed. Microcatheter, a number aspiration catheter were withdrawn. The balloon guide catheter was positioned in the common carotid artery for final angiogram. All catheter and wires were removed. The sheath at the right common femoral artery access was withdrawn for an angiogram. Eight French Angio-Seal was then deployed for hemostasis. A flat panel CT was then performed.  Patient was then extubated. Estimated blood loss was 50 cc No complications encountered. IMPRESSION: Status post left-sided cerebral angiogram with treatment of left ICA/MCA tandem occlusion, requiring acute left carotid stenting and mechanical thrombectomy of a left M1 occlusion with achievement of mTICI-3 flow, and restoration of cervical ICA flow. Flat panel CT in the room demonstrates small contrast stain without complicating features, and the patient was extubated. Status post deployment of 8 French Angio-Seal for right common femoral artery hemostasis. Status post  ultrasound-guided left common femoral artery access with a 4 French sheath for arterial monitoring, which remained after the case. Status post ultrasound-guided right common femoral vein access for additional IV access, which remained after the case transmitting the Integrilin drip. Signed, Dulcy Fanny. Earleen Newport, DO [facility] Radiology PLAN: Patient will go to the PACU ICU admission Bilateral hips will be straight until the left 4 French arterial monitoring sheath is removed and the right common femoral vein venous access sheath is removed. Routine wound management  for the right common femoral artery sheath access. Frequent neurovascular checks. IV saline hydration for 8 hours given the renal insufficiency Blood pressure control management of 120-140 with nicardipine drip ordered The goal will be dual anti-platelet therapy, for now with dose of 650 mg aspirin and the Integrilin drip. Electronically Signed   By: Corrie Mckusick D.O.   On: 05/06/2017 09:24   Ct Head Code Stroke Wo Contrast  Result Date: 05/06/2017 CLINICAL DATA:  Code stroke. 77 y/o M; right-sided weakness and numbness with slurred speech. EXAM: CT HEAD WITHOUT CONTRAST TECHNIQUE: Contiguous axial images were obtained from the base of the skull through the vertex without intravenous contrast. COMPARISON:  11/19/2016 MRI of the head. FINDINGS: Brain: No evidence of acute infarction, hemorrhage, hydrocephalus, extra-axial collection or mass lesion/mass effect. Stable moderate chronic microvascular ischemic changes and parenchymal volume loss of the brain. Vascular: Calcific atherosclerosis of carotid siphons. Dense left M1 (series 5, image 30). Skull: Normal. Negative for fracture or focal lesion. Sinuses/Orbits: No acute finding. Other: None. ASPECTS [facility] Stroke Program Early CT Score) - Ganglionic level infarction (caudate, lentiform nuclei, internal capsule, insula, M1-M3 cortex): 7 - Supraganglionic infarction (M4-M6 cortex): 3 Total score (0-10 with 10 being normal): 10 IMPRESSION: 1. No acute stroke, hemorrhage, or mass effect identified. 2. Dense left M1, possible thrombus. 3. ASPECTS is 10 4. Stable chronic microvascular ischemic changes and parenchymal volume loss of the brain given differences in technique. These results were communicated to Dr. Cheral Marker at 3:41 amon 2/28/2019by text page via the [facility] messaging system. Electronically Signed   By: Kristine Garbe M.D.   On: 05/06/2017 03:43   TTE   PHYSICAL EXAM  Temp:  [98.3 F (36.8 C)-100.3 F (37.9 C)] 98.4 F (36.9 C) (03/01  1200) Pulse Rate:  [51-80] 67 (03/01 1400) Resp:  [11-24] 24 (03/01 1400) BP: (106-146)/(57-70) 136/64 (03/01 1400) SpO2:  [91 %-100 %] 94 % (03/01 1400) Arterial Line BP: (93)/(88) 93/88 (02/28 1500)  General - Well nourished, well developed, in no acute distress.  Ophthalmologic - fundi not visualized due to noncooperation.  Cardiovascular - Regular rate and rhythm.  Neuro - awake alert, eyes open, follows simple commands, paucity of speech, moderate dysarthria, able to repeat sentences and name. Eyes no gaze preference, no neglect, blinking to visual threat bilaterally. PERRL, right facial droop, tongue midline. RUE 4/5 with drift, BLE 4/5, LUE 5/5. DTR 1+ and no babinski. Sensation symmetrical, coordination intact bilaterally, gait deferred.    ASSESSMENT/PLAN Mr. Jaykwon Morones is a 77 y.o. 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 No tPA given due to concern of brain metastasis.    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 cardioembolic source  Resultant left gaze preference,  right facial droop and right UE mild weakness  MRI  Left BG, mesial temporal lobe small infarcts and left MCA punctate infarcts  MRA  Patent left ICA and MCA  CTA head and neck - left ICA and left M1 occlusion  DSA - TICI3 reperfusion, left ICA stenting  2D Echo  Pending  Will do TEE and loop Monday to evaluate cardiac source  LDL 69  HgbA1c 9.1  subq heparin for VTE prophylaxis Fall precautions  Diet heart healthy/carb modified Room service appropriate? Yes; Fluid consistency: Thin   aspirin 81 mg daily prior to admission, now on aspirin 325 mg daily and integrelin. Will stop integrelin today and switch to plavix with loading  dose. Continue ASA  Ongoing aggressive stroke risk factor management  Therapy recommendations:  Pending   Disposition:  Pending  Small cell lung cancer   11/2016 brain MRI no brain metastasis  On chemo and radiation  Follows with [facility]  Discussed with Dr. Rogue Bussing, pt primary oncologist, no concern of hypercoagulable state at this time, he needs further cardiac work up  Diabetes  HgbA1c 9.1 goal < 7.0  Hyperglycemia, but overnight hypoglycemia - likely due to not eating well - now eating good  On pre-meal insulin now  Resume home po meds  CBG monitoring  SSI  DM coordinator consult   Hypertension but now hypotension BP at low end Likely due to ICA stenting  Long term BP goal normotensive  completed albumin  On IVF  On neo  Put on midodrine, taper off neo as able  Hyperlipidemia  Home meds:  none   LDL 69, goal < 70  Statin allergy listed in file  CKD  Cre 1.98->1.80->1.88  At baseline   Continue IVF  Other Stroke Risk Factors  Advanced age  Former smoker - quit 13 years ago  Other [facility] day # 1  This patient is critically ill due to 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 This patient's care requires constant monitoring of vital signs, hemodynamics, respiratory and cardiac monitoring, review of multiple databases, neurological assessment, discussion with family, other specialists and medical decision making of high complexity. I had long discussion with wife at bedside, updated pt current condition, treatment plan and potential prognosis. She expressed understanding and appreciation. I spent 40 minutes of neurocritical care time in the care of this patient.   Rosalin Hawking, MD  PhD Stroke Neurology 05/07/2017 2:15 PM    To contact Stroke Continuity provider, please refer to [email or web address] After hours, contact General Neurology

## 2017-05-07 NOTE — Progress Notes (Signed)
PT evaluation  Pt admitted with/for stroke and revascularized in IR.  Still with some residual weakness, but much improve.  Pt needed min to light moderate assist for basic mobility and gait.Marland Kitchen  Pt currently limited functionally due to the problems listed. ( See problems list.)   Pt will benefit from PT to maximize function and safety in order to get ready for next venue listed below.    05/07/17 1336  PT Visit Information  Last PT Received On 05/07/17  History of Present Illness [name] is a 77 y.o. 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 No tPA given due to concern of brain metastasis.  Per MRI, IR has restored patency of the left ICA, terminus and left MCA.  Showed infarct to the left BG, left amygdala and small scattered tiny cortical infarcts throughout the left MCA territory.  Precautions  Precautions Fall  Home Living  Family/patient expects to be discharged to: Private residence  Living Arrangements Spouse/significant other  Available Help at Discharge Family;Available 24 hours/day  Type of Home House  Home Access Stairs to enter  Entrance Stairs-Number of Steps 2  Entrance Stairs-Rails Right;Left  Home Layout One level  Barrister's clerk None  Prior Function  Level of Independence Independent  Comments does all his ADL's and drives  Communication  Communication No difficulties;Expressive difficulties (?some receptive aphasia, but functional for eval.)  Pain Assessment  Pain Assessment Faces  Faces Pain Scale 2  Pain Location headache  Pain Descriptors / Indicators Aching  Pain Intervention(s) Monitored during session  Cognition  Arousal/Alertness Awake/alert  Behavior During Therapy WFL for tasks assessed/performed  Overall Cognitive Status Difficult to assess   Difficult to assess due to Impaired communication  Upper Extremity Assessment  Upper Extremity Assessment Defer to OT evaluation  Lower Extremity Assessment  Lower Extremity Assessment RLE deficits/detail;LLE deficits/detail  RLE Deficits / Details R LE weakened, moves syngergistically with some isolation.  Gross extension 4-/5  RLE Coordination decreased fine motor  LLE Deficits / Details WFL  Bed Mobility  Overal bed mobility Needs Assistance  Bed Mobility Supine to Sit  Supine to sit Mod assist;+2 for physical assistance;+2 for safety/equipment  General bed mobility comments cues for sequencing and assist to come up via right elbow.  pt needed assist and Right LE to intiate and abduct off the bed.  Transfers  Overall transfer level Needs assistance  Equipment used Rolling walker (2 wheeled)  Transfers Sit to/from Stand  Sit to Stand Mod assist;+2 safety/equipment  General transfer comment cues for hand placement and assist to come forward and boost up.  Ambulation/Gait  Ambulation/Gait assistance Mod assist;+2 safety/equipment  Ambulation Distance (Feet) 15 Feet  Assistive device Rolling walker (2 wheeled)  Gait Pattern/deviations Decreased step length - left;Decreased step length - right;Decreased stance time - right;Decreased stride length  General Gait Details paretic gait with small tentative steps and need for stability and maneuvering of the RW  Modified Rankin (Stroke Patients Only)  Pre-Morbid Rankin Score 0  Modified Rankin 4  Balance  Overall balance assessment Needs assistance  Sitting balance-Leahy Scale Fair  Standing balance support Bilateral upper extremity supported  Standing balance-Leahy Scale Poor  Standing balance comment reliant on external support  General Comments  General comments (skin integrity, edema, etc.) vss  PT - End of Session  Activity Tolerance Patient tolerated treatment well;Patient limited by fatigue  Patient left in chair;with call  bell/phone within reach;with chair alarm set;with family/visitor present  Nurse Communication Mobility status  PT Assessment  PT Recommendation/Assessment Patient needs continued PT services  PT Visit Diagnosis Unsteadiness on feet (R26.81);Other abnormalities of gait and mobility (R26.89);Hemiplegia and hemiparesis  Hemiplegia - Right/Left Right  Hemiplegia - dominant/non-dominant Dominant  Hemiplegia - caused by Cerebral infarction  PT Problem List Decreased strength;Decreased activity tolerance;Decreased balance;Decreased mobility;Decreased coordination  PT Plan  PT Frequency (ACUTE ONLY) Min 3X/week  PT Treatment/Interventions (ACUTE ONLY) Gait training;Functional mobility training;Therapeutic activities;Balance training;Neuromuscular re-education;Patient/family education  AM-PAC PT "6 Clicks" Daily Activity Outcome Measure  Difficulty turning over in bed (including adjusting bedclothes, sheets and blankets)? 1  Difficulty moving from lying on back to sitting on the side of the bed?  1  Difficulty sitting down on and standing up from a chair with arms (e.g., wheelchair, bedside commode, etc,.)? 1  Help needed moving to and from a bed to chair (including a wheelchair)? 2  Help needed walking in hospital room? 2  Help needed climbing 3-5 steps with a railing?  1  6 Click Score 8  Mobility G Code  CM  PT Recommendation  Recommendations for Other Services Rehab consult  Follow Up Recommendations CIR;Supervision/Assistance - 24 hour  PT equipment Other (comment) (TBA, has no DME at this time)  Individuals Consulted  Consulted and Agree with Results and Recommendations Patient;Family member/caregiver  Family Member Consulted wife  Acute Rehab PT Goals  Patient Stated Goal home independent  PT Goal Formulation With patient  Time For Goal Achievement 05/21/17  Potential to Achieve Goals Good  PT Time Calculation  PT Start Time (ACUTE ONLY) 1314  PT Stop Time (ACUTE ONLY) 1335  PT  Time Calculation (min) (ACUTE ONLY) 21 min  PT General Charges  $$ ACUTE PT VISIT 1 Visit  PT Evaluation  $PT Eval Moderate Complexity 1 Mod  Written Expression  Dominant Hand Right    05/07/2017  Donnella Sham, PT [phone]  (pager)

## 2017-05-07 NOTE — Progress Notes (Signed)
Referring Physician(s): Dr. Cheral Marker  Supervising Physician: Corrie Mckusick  Patient Status:  [facility] - In-pt  Chief Complaint: CVA  Subjective: Arousable.  Noticeable deficits continue on right.   Allergies: Penicillin g; Shellfish-derived products; Statins; Erythromycin; Tamsulosin; Tuberculin ppd; and Iodinated diagnostic agents  Medications: Prior to Admission medications   Medication Sig Start Date End Date Taking? Authorizing Provider  albuterol (PROVENTIL HFA;VENTOLIN HFA) 108 (90 Base) MCG/ACT inhaler Inhale 2 puffs into the lungs every 6 (six) hours as needed for wheezing or shortness of breath. 03/25/17  Yes Cammie Sickle, MD  APPLE CIDER VINEGAR PO Take 450 mg by mouth at bedtime.    Yes [provider]  aspirin EC 81 MG tablet Take 81 mg by mouth at bedtime.    Yes [provider]  cetirizine (ZYRTEC) 10 MG tablet Take 10 mg by mouth daily.   Yes [provider]  cholecalciferol (VITAMIN D) 1000 units tablet Take 1,000 Units by mouth daily.   Yes [provider]  Coenzyme Q10 100 MG capsule Take 100 mg by mouth daily.    Yes [provider]  Flaxseed, Linseed, (FLAXSEED OIL) 1000 MG CAPS Take 1,000 mg by mouth at bedtime.    Yes [provider]  fluticasone (FLONASE) 50 MCG/ACT nasal spray Place 1 spray into both nostrils daily as needed for allergies or rhinitis.   Yes [provider]  Fluticasone-Salmeterol (ADVAIR DISKUS) 500-50 MCG/DOSE AEPB Inhale 1 puff into the lungs 2 (two) times daily. 03/25/17  Yes Cammie Sickle, MD  glipiZIDE (GLUCOTROL) 10 MG tablet Take 10 mg by mouth daily. Time release capsule   Yes [provider]  glucagon (GLUCAGON EMERGENCY) 1 MG injection Inject 1 mg into the muscle once as needed. When blood glucose drops less than 50; and if patient has difficulty drinking. 12/16/16  Yes Cammie Sickle, MD  GNP GARLIC EXTRACT PO Take 9,924 mg by mouth daily.     Yes [provider]  hydrochlorothiazide (HYDRODIURIL) 25 MG tablet Take 25 mg by mouth daily.   Yes [provider]  linagliptin (TRADJENTA) 5 MG TABS tablet Take 5 mg by mouth daily.   Yes [provider]  lisinopril (PRINIVIL,ZESTRIL) 10 MG tablet Take 10 mg by mouth daily.   Yes [provider]  methocarbamol (ROBAXIN) 500 MG tablet Take 1 tablet (500 mg total) by mouth every 8 (eight) hours as needed for muscle spasms. 03/19/17  Yes Verlon Au, NP  Multiple Vitamin (MULTIVITAMIN WITH MINERALS) TABS tablet Take 1 tablet by mouth daily.   Yes [provider]  omeprazole (PRILOSEC) 20 MG capsule Take 20 mg by mouth every Monday, Wednesday, and Friday.  07/04/14  Yes [provider]  simethicone (MYLICON) 80 MG chewable tablet Chew 80 mg by mouth as needed for flatulence.    Yes [provider]  vitamin C (ASCORBIC ACID) 250 MG tablet Take 250 mg by mouth daily.   Yes [provider]  zinc sulfate 220 (50 Zn) MG capsule Take 220 mg by mouth daily.   Yes [provider]  dexamethasone (DECADRON) 4 MG tablet Take 1 tablet (4 mg total) by mouth daily. Starting on the first day of radiation Patient not taking: Reported on 05/06/2017 04/02/17   Noreene Filbert, MD  lidocaine-prilocaine (EMLA) cream Apply 1 application topically as needed. Patient not taking: Reported on 05/06/2017 11/24/16   Cammie Sickle, MD  ondansetron (ZOFRAN) 8 MG tablet Take 1 tablet (8  mg total) by mouth every 8 (eight) hours as needed for nausea or vomiting (start 3 days; after chemo). Patient not taking: Reported on 03/25/2017 11/22/16   Cammie Sickle, MD  prochlorperazine (COMPAZINE) 10 MG tablet Take 1 tablet (10 mg total) by mouth every 6 (six) hours as needed for nausea or vomiting. Patient not taking: Reported on 03/25/2017 11/22/16   Cammie Sickle, MD  ZINC-VITAMIN C MT Take 1 tablet daily by mouth.    [provider]     Vital Signs: BP 140/64   Pulse 67   Temp 99.1 F (37.3 C) (Oral)   Resp 16   Ht 6\' 3"  (1.905 m)   Wt 212 lb 1.3 oz (96.2 kg)   SpO2 93%   BMI 26.51 kg/m   Physical Exam  Constitutional: He appears well-developed.  Cardiovascular: Normal rate, regular rhythm and normal heart sounds.  R DP audible with doppler.  Pulmonary/Chest: Effort normal and breath sounds normal. No respiratory distress.  Abdominal: Soft.  Neurological:  Arouses easily.  Right-sided facial droop.  Slurred but intelligible speech.  Able to move right side, although week.   Skin: Skin is warm and dry.  Groin intact.   Nursing note and vitals reviewed.   Imaging: Ct Angio Head W Or Wo Contrast  Result Date: 05/06/2017 CLINICAL DATA:  77 y/o M; right-sided weakness, numbness, slurred speech. EXAM: CT ANGIOGRAPHY HEAD AND NECK TECHNIQUE: Multidetector CT imaging of the head and neck was performed using the standard protocol during bolus administration of intravenous contrast. Multiplanar CT image reconstructions and MIPs were obtained to evaluate the vascular anatomy. Carotid stenosis measurements (when applicable) are obtained utilizing NASCET criteria, using the distal internal carotid diameter as the denominator. CONTRAST:  11mL ISOVUE-370 IOPAMIDOL (ISOVUE-370) INJECTION 76% COMPARISON:  None. FINDINGS: CTA NECK FINDINGS Aortic arch: Standard branching. Imaged portion shows no evidence of aneurysm or dissection. No significant stenosis of the major arch vessel origins. Right carotid system: No evidence of dissection, stenosis (50% or greater) or occlusion. Left carotid system: Patent left common carotid artery. Left ICA occlusion to the terminus where there is a short segment of reconstitution of the terminal ICA and proximal left M1 via the anterior communicating artery. Vertebral arteries: Codominant. No evidence of dissection, stenosis (50% or greater) or occlusion. Skeleton: Moderate cervical  spine spondylosis with multilevel discogenic degenerative changes greatest at the C4 through C7 levels or uncovertebral and facet hypertrophy encroach on neural foramen and there is mild canal stenosis. Other neck: Negative. Upper chest: Right port catheter in the SVC extending below field of view. Review of the MIP images confirms the above findings CTA HEAD FINDINGS Anterior circulation: Left mid M1 occlusion with poor left MCA distribution collateral circulation. Right distal M1 mild stenosis. No additional large vessel occlusion, aneurysm, or vascular malformation in the anterior circulation. Posterior circulation: No significant stenosis, proximal occlusion, aneurysm, or vascular malformation. Right P2 moderate stenosis and left P2 mild stenosis. Venous sinuses: As permitted by contrast timing, patent. Anatomic variants: Patent anterior and bilateral posterior communicating arteries. Review of the MIP images confirms the above findings IMPRESSION: 1. 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 Short segment of patency of left ICA terminus and proximal left M1 via anterior communicating artery. 2. Left mid M1 occlusion with poor left MCA collateralization. 3. Patent right carotid and bilateral vertebral systems. 4. Patent bilateral ACA, right MCA, and bilateral PCA distributions. Intracranial atherosclerosis with multiple segments of mild-to-moderate stenosis. These results were called by  telephone at the time of interpretation on 05/06/2017 at 4:10 am to Dr. Cheral Marker, who verbally acknowledged these results. Electronically Signed   By: Kristine Garbe M.D.   On: 05/06/2017 04:17   Ct Angio Neck W And/or Wo Contrast  Result Date: 05/06/2017 CLINICAL DATA:  77 y/o M; right-sided weakness, numbness, slurred speech. EXAM: CT ANGIOGRAPHY HEAD AND NECK TECHNIQUE: Multidetector CT imaging of the head and neck was performed using the standard protocol during bolus administration of intravenous contrast.  Multiplanar CT image reconstructions and MIPs were obtained to evaluate the vascular anatomy. Carotid stenosis measurements (when applicable) are obtained utilizing NASCET criteria, using the distal internal carotid diameter as the denominator. CONTRAST:  66mL ISOVUE-370 IOPAMIDOL (ISOVUE-370) INJECTION 76% COMPARISON:  None. FINDINGS: CTA NECK FINDINGS Aortic arch: Standard branching. Imaged portion shows no evidence of aneurysm or dissection. No significant stenosis of the major arch vessel origins. Right carotid system: No evidence of dissection, stenosis (50% or greater) or occlusion. Left carotid system: Patent left common carotid artery. Left ICA occlusion to the terminus where there is a short segment of reconstitution of the terminal ICA and proximal left M1 via the anterior communicating artery. Vertebral arteries: Codominant. No evidence of dissection, stenosis (50% or greater) or occlusion. Skeleton: Moderate cervical spine spondylosis with multilevel discogenic degenerative changes greatest at the C4 through C7 levels or uncovertebral and facet hypertrophy encroach on neural foramen and there is mild canal stenosis. Other neck: Negative. Upper chest: Right port catheter in the SVC extending below field of view. Review of the MIP images confirms the above findings CTA HEAD FINDINGS Anterior circulation: Left mid M1 occlusion with poor left MCA distribution collateral circulation. Right distal M1 mild stenosis. No additional large vessel occlusion, aneurysm, or vascular malformation in the anterior circulation. Posterior circulation: No significant stenosis, proximal occlusion, aneurysm, or vascular malformation. Right P2 moderate stenosis and left P2 mild stenosis. Venous sinuses: As permitted by contrast timing, patent. Anatomic variants: Patent anterior and bilateral posterior communicating arteries. Review of the MIP images confirms the above findings IMPRESSION: 1. Left ICA occlusion from origin to  terminus. Short segment of patency of left ICA terminus and proximal left M1 via anterior communicating artery. 2. Left mid M1 occlusion with poor left MCA collateralization. 3. Patent right carotid and bilateral vertebral systems. 4. Patent bilateral ACA, right MCA, and bilateral PCA distributions. Intracranial atherosclerosis with multiple segments of mild-to-moderate stenosis. These results were called by telephone at the time of interpretation on 05/06/2017 at 4:10 am to Dr. Cheral Marker, who verbally acknowledged these results. Electronically Signed   By: Kristine Garbe M.D.   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 Personal history of prior whole brain radiation for cancer, but no known brain metastases. EXAM: MRI HEAD WITHOUT CONTRAST MRA HEAD WITHOUT CONTRAST TECHNIQUE: Multiplanar, multiecho pulse sequences of the brain and surrounding structures were obtained without intravenous contrast. Angiographic images of the head were obtained using MRA technique without contrast. COMPARISON:  Cerebral angiogram, CTA head and neck, and noncontrast head CT earlier today. Prior brain MRI 11/19/2016. FINDINGS: MRI HEAD FINDINGS Brain: Restricted diffusion in the left basal ganglia (series 3, image 32) with T2 and FLAIR hyperintensity. No associated hemorrhage. No significant mass effect. There is a small 9 millimeter area of restricted diffusion in the medial left temporal lobe at the  amygdala (series 3, image 20). There are otherwise a small number of tiny scattered foci of restricted diffusion in the left MCA territory (such as series 3, image 40). There is no right hemisphere or posterior circulation restricted diffusion. Patchy and scattered bilateral cerebral white matter T2 and FLAIR hyperintensity elsewhere is chronic and  stable. The right deep gray matter nuclei, left thalamus, brainstem, and cerebellum remain normal for age. No midline shift, mass effect, evidence of mass lesion, ventriculomegaly, extra-axial collection or acute intracranial hemorrhage. Cervicomedullary junction and pituitary are within normal limits. Vascular: Major intracranial vascular flow voids are preserved, the left ICA flow void was abnormal in 2018 and appears mildly improved today. Negative visible cervical spine.  Normal bone marrow signal. There is mild leftward gaze deviation. Orbits soft tissues otherwise appear normal. Paranasal Visualized paranasal sinuses and mastoids are stable and well pneumatized. Scalp and face soft tissues appear negative. MRA HEAD FINDINGS Antegrade flow in the posterior circulation with codominant distal vertebral arteries. Mild distal vertebral irregularity with no stenosis. Patent vertebrobasilar junction without stenosis. Dominant appearing AICA origins are patent. Mild basilar artery irregularity without stenosis. SCA origins are patent. Bilateral PCA origins appear normal. Posterior communicating arteries are diminutive or absent. Bilateral PCA branches are mildly irregular, and there is mild right PCA P2 segment stenosis which is stable from earlier today. Antegrade flow in both ICA siphons. Mild luminal narrowing and irregularity throughout the visible left ICA may reflect residual thrombus or plaque. The left ICA is patent to the left terminus. The left MCA and ACA origins are patent. There is mild to moderate irregularity at the left ACA origin and proximal A1 segment which is stable from earlier today. There is mild irregularity scratched at the left MCA and its branches now are patent. There is mild left M1 irregularity. The left MCA bifurcation is patent with no branch occlusion identified. Antegrade flow in the right ICA siphon which is only mildly irregular and without stenosis. Normal right ophthalmic artery  origin. Normal right ICA terminus, right ACA and MCA origins. The anterior communicating artery is normal. The visible ACA branches are stable, with mild irregularity of the distal left ACA. The right MCA M1 segment is patent with mild irregularity just proximal to the bifurcation which is stable. The right MCA branches are stable and within normal limits. IMPRESSION: 1. Restored patency of the Left ICA, terminus, and Left MCA since earlier today. Mild residual irregularity throughout the left ICA siphon, and in the left M1 segment. Stable mild to moderate irregularity in the left A1 and distal A2 segments. 2. Associated core infarct primarily confined to the Left Basal Ganglia. There is a small 9 mm infarct in the left amygdala, and there are small number of scattered tiny cortical infarcts elsewhere in the Left MCA territory. 3. Mild cytotoxic edema with no associated hemorrhage or mass effect. 4. 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 Electronically Signed   By: Genevie Ann M.D.   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 Personal history of prior whole brain radiation for cancer, but no known brain metastases. EXAM: MRI HEAD WITHOUT CONTRAST MRA HEAD WITHOUT CONTRAST TECHNIQUE: Multiplanar, multiecho pulse sequences of the brain and surrounding structures were obtained without intravenous contrast. Angiographic images of the head were obtained using MRA  technique without contrast. COMPARISON:  Cerebral angiogram, CTA head and neck, and noncontrast head CT earlier today. Prior brain MRI 11/19/2016. FINDINGS: MRI HEAD FINDINGS Brain: Restricted diffusion in the left basal ganglia (series 3, image 32) with T2 and FLAIR hyperintensity. No associated hemorrhage. No significant mass  effect. There is a small 9 millimeter area of restricted diffusion in the medial left temporal lobe at the amygdala (series 3, image 20). There are otherwise a small number of tiny scattered foci of restricted diffusion in the left MCA territory (such as series 3, image 40). There is no right hemisphere or posterior circulation restricted diffusion. Patchy and scattered bilateral cerebral white matter T2 and FLAIR hyperintensity elsewhere is chronic and stable. The right deep gray matter nuclei, left thalamus, brainstem, and cerebellum remain normal for age. No midline shift, mass effect, evidence of mass lesion, ventriculomegaly, extra-axial collection or acute intracranial hemorrhage. Cervicomedullary junction and pituitary are within normal limits. Vascular: Major intracranial vascular flow voids are preserved, the left ICA flow void was abnormal in 2018 and appears mildly improved today. Negative visible cervical spine.  Normal bone marrow signal. There is mild leftward gaze deviation. Orbits soft tissues otherwise appear normal. Paranasal Visualized paranasal sinuses and mastoids are stable and well pneumatized. Scalp and face soft tissues appear negative. MRA HEAD FINDINGS Antegrade flow in the posterior circulation with codominant distal vertebral arteries. Mild distal vertebral irregularity with no stenosis. Patent vertebrobasilar junction without stenosis. Dominant appearing AICA origins are patent. Mild basilar artery irregularity without stenosis. SCA origins are patent. Bilateral PCA origins appear normal. Posterior communicating arteries are diminutive or absent. Bilateral PCA branches are mildly irregular, and there is mild right PCA P2 segment stenosis which is stable from earlier today. Antegrade flow in both ICA siphons. Mild luminal narrowing and irregularity throughout the visible left ICA may reflect residual thrombus or plaque. The left ICA is patent to the left terminus. The left MCA and ACA  origins are patent. There is mild to moderate irregularity at the left ACA origin and proximal A1 segment which is stable from earlier today. There is mild irregularity scratched at the left MCA and its branches now are patent. There is mild left M1 irregularity. The left MCA bifurcation is patent with no branch occlusion identified. Antegrade flow in the right ICA siphon which is only mildly irregular and without stenosis. Normal right ophthalmic artery origin. Normal right ICA terminus, right ACA and MCA origins. The anterior communicating artery is normal. The visible ACA branches are stable, with mild irregularity of the distal left ACA. The right MCA M1 segment is patent with mild irregularity just proximal to the bifurcation which is stable. The right MCA branches are stable and within normal limits. IMPRESSION: 1. Restored patency of the Left ICA, terminus, and Left MCA since earlier today. Mild residual irregularity throughout the left ICA siphon, and in the left M1 segment. Stable mild to moderate irregularity in the left A1 and distal A2 segments. 2. Associated core infarct primarily confined to the Left Basal Ganglia. There is a small 9 mm infarct in the left amygdala, and there are small number of scattered tiny cortical infarcts elsewhere in the Left MCA territory. 3. Mild cytotoxic edema with no associated hemorrhage or mass effect. 4. 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 Electronically Signed   By: Genevie Ann M.D.   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 The antibiotic was administered within 1 hour of the procedure 75 MCG NITROGLYCERIN INTRA ARTERIAL 650 mg aspirin via orogastric tube. Intra procedural initiation of Integrilin IV drip for dual anti-platelet therapy. ANESTHESIA/SEDATION: General endotracheal tube anesthesia with anesthesia team CONTRAST:  144 cc IV contrast FLUOROSCOPY TIME:  Fluoroscopy Time: 54 minutes 6 seconds (2,926 mGy). COMPLICATIONS: None TECHNIQUE: Informed written consent was obtained from the patient's family after a thorough discussion of the procedural risks, benefits and alternatives. Specific risks discussed include: Bleeding, infection, contrast reaction, kidney injury/failure, need for further procedure/surgery, arterial injury or dissection, embolization to new territory, intracranial hemorrhage (10-15% risk), neurologic deterioration, cardiopulmonary collapse, death. All questions were addressed. Maximal Sterile Barrier Technique was utilized including during the procedure including caps, mask, sterile gowns, sterile gloves, sterile drape, hand hygiene and skin antiseptic. A timeout was performed prior to the initiation of the procedure. FINDINGS: Initial angiogram: Left common carotid artery:  Normal course caliber and contour. Left external carotid artery: Patent with antegrade flow. On the initial injection before treatment, there is minimal opacification of the carotid siphon secondary to meningeal branches which appear to be region ating from ascending  pharyngeal distribution and potentially deep meningeal branches from the external carotid artery. No significant filling of the MCA on the initial injection The initial injection does confirmed perfusion of the lenticulostriate vasculature, as well as a patent anterior coronal artery, with minimal filling of temporal lobe. Anterior basal ganglia structures are perfused. Left internal carotid artery: Internal carotid artery is occluded at the origin with a string sign at the posterior aspect of the artery. Left MCA: Initial injection demonstrates no significant filling of the middle cerebral artery. 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 Left ACA: Anterior cerebral artery perfused by the right-sided flow at the initiation. Final angiogram: After treatment of the carotid occlusion and deployment of 8 mm-6 mm x 40 mm carotid stent, there is excellent flow through the cervical carotid to the intracranial carotid. After mechanical thrombectomy of M1 occlusion, there is modified treatment in cerebral ischemia of 3. Flat panel CT: No complications with small contrast stain of the left basal ganglia. PROCEDURE: Patient was brought to the neurointerventional suite, with the patient identity confirmed, and consent form confirmed. General anesthesia was present for initiation of general endotracheal tube anesthesia. Patient was prepped and draped in the usual sterile fashion. The only venous access was a port catheter with single needle access. Ultrasound survey of the right inguinal region was performed with images stored and sent to PACs. 11 blade scalpel was used to make a small incision. A micropuncture needle was used access the right common femoral artery under ultrasound. With excellent arterial blood flow returned, an .018 micro wire was passed through the needle, observed to enter  the abdominal aorta under fluoroscopy. The needle was removed, and a  micropuncture sheath was placed over the wire. The inner dilator and wire were removed, and an 035 Bentson wire was advanced under fluoroscopy into the abdominal aorta. The sheath was removed and a standard 5 [place] vascular sheath was placed. The dilator was removed and the sheath was flushed. After the initial common femoral artery access, ultrasound survey of the left inguinal region was performed in order to place arterial monitoring line. Images stored and sent to PACs. Micropuncture needle was used access the left common femoral artery under ultrasound. With excellent arterial blood flow returned, an .018 micro wire was passed through the needle, observed to enter the abdominal aorta under fluoroscopy. The needle was removed, and a micropuncture sheath was placed over the wire. The inner dilator and wire were removed, and an 035 Bentson wire was advanced under fluoroscopy into the abdominal aorta. The sheath was removed and a standard 4 [place] vascular sheath was placed. The dilator was removed and the sheath was flushed. A 59F JB-1 diagnostic catheter was advanced over the wire through the right common femoral artery access to the proximal descending thoracic aorta. Wire was then removed. Double flush of the catheter was performed. Catheter was then used to select the left common carotid artery. Angiogram was performed. Using roadmap technique, the catheter was advanced over a roadrunner wire into right common carotid artery. Formal angiogram was performed. Catheter was advanced over the roadrunner wire into the external carotid artery. Wire was removed. Exchange length Rosen wire was then passed through the diagnostic catheter to the distal common carotid artery and the diagnostic catheter was removed. The 5 French sheath was removed and exchanged for 8 French 55 centimeter BrightTip sheath. Sheath was flushed and attached to pressurized and heparinized saline bag for constant forward flow. Then an 8 [place],  95 cm Flowgate balloon tip catheter was prepared on the back table with inflation of the balloon with 50/50 concentration of dilute contrast. The balloon catheter was then advanced over the wire, positioned into the distal common carotid artery. Copious back flush was performed and the balloon catheter was attached to heparinized and pressurized saline bag for forward flow. Roadmap was then performed at the carotid bifurcation. Combination of a rapid transit microwire and a synchro soft wire were used to navigate beyond the occlusion at the proximal internal carotid artery. The microcatheter was then navigated into the distal cervical segment, wire was removed, blood was aspirated, and a gentle injection confirmed a luminal position. Exchange length Transcend wire was then placed through the microcatheter into the cervical carotid. Microcatheter was removed. Balloon angioplasty was then performed with a rapid exchange system, using a Viatrac balloon 5 mm x 30 mm. The patient required treatment at this time with atropine dose, as we experienced bradycardia into the 30s. His heart rate recovered after treatment by the anesthesia team. Balloon was removed. Angiogram confirmed flow into the cervical carotid. A coaxial system of an Ace 64 penumbra aspiration catheter and the Trevo pro view 18 catheter was then advanced over the exchange length wire into the cervical carotid. Once the coaxial system was in the cervical carotid, the microwire was removed and exchanged for synchro soft. The synchro soft wire was navigated beyond the occlusion under roadmap guidance, and the aspiration catheter was advanced into the ICA terminus. The balloon guide catheter was then advanced into the mid segment of the cervical carotid artery. The microcatheter and microwire were then  carefully advanced through the occluded segment. Microcatheter was then push through the occluded segment and the wire was removed. Blood was then aspirated  through the hub of the microcatheter, and a gentle contrast injection was performed confirming intraluminal position. A rotating hemostatic valve was then attached to the back end of the microcatheter, and a pressurized and heparinized saline bag was attached to the catheter. 4 x 40 solitaire device was then selected. Back flush was achieved at the rotating hemostatic valve, and then the device was gently advanced through the microcatheter to the distal end. The retriever was then unsheathed by withdrawing the microcatheter under fluoroscopy. A 5 minutes interval was observed. At this time, ultrasound-guided access of the right common femoral vein was performed for placement of a additional venous access and placement of a 12 French triple-lumen IV catheter. We then proceeded with the mechanical thrombectomy. The balloon at the balloon tip catheter was then inflated under fluoroscopy for proximal flow arrest. Constant aspiration was then performed at the penumbra aspiration catheter at the carotid terminus as the retriever was gently and slowly withdrawn with fluoroscopic observation. Once the retriever was entirely removed from the system, free aspiration was confirmed at the hub of the aspiration catheter, with free blood return confirmed. The balloon was then deflated, rotating hemostatic valve was reattached, and a control angiogram was performed, confirming restoration of flow. Penumbra catheter was then withdrawn, and the angiogram was performed at the carotid bifurcation. Given the appearance, we elected to place acute carotid stent. The lesion was then again navigated with synchro soft wire and rapid transit. With the microwire microcatheter positioned in the distal cervical carotid, the microwire was removed. The exchange length Transcend wire was then replaced and the microcatheter was removed. Acute carotid stent was then deployed, 8 mm-6 mm x 40 mm length. Before deployment, we initiated Integrilin drip  treatment for dual anti-platelet therapy. Angiogram was performed. Coaxial system of the penumbra aspiration catheter and the pro view 18 catheter were then navigated to the carotid siphon for better angiogram of the cerebral vasculature. Angiogram was performed. Microcatheter, a number aspiration catheter were withdrawn. The balloon guide catheter was positioned in the common carotid artery for final angiogram. All catheter and wires were removed. The sheath at the right common femoral artery access was withdrawn for an angiogram. Eight French Angio-Seal was then deployed for hemostasis. A flat panel CT was then performed.  Patient was then extubated. Estimated blood loss was 50 cc No complications encountered. IMPRESSION: Status post left-sided cerebral angiogram with treatment of left ICA/MCA tandem occlusion, requiring acute left carotid stenting and mechanical thrombectomy of a left M1 occlusion with achievement of mTICI-3 flow, and restoration of cervical ICA flow. Flat panel CT in the room demonstrates small contrast stain without complicating features, and the patient was extubated. Status post deployment of 8 French Angio-Seal for right common femoral artery hemostasis. Status post ultrasound-guided left common femoral artery access with a 4 French sheath for arterial monitoring, which remained after the case. Status post ultrasound-guided right common femoral vein access for additional IV access, which remained after the case transmitting the Integrilin drip. Signed, Dulcy Fanny. Earleen Newport, DO [facility] Radiology PLAN: Patient will go to the PACU ICU admission Bilateral hips will be straight until the left 4 French arterial monitoring sheath is removed and the right common femoral vein venous access sheath is removed. Routine wound management for the right common femoral artery sheath access. Frequent neurovascular checks. IV  saline hydration for 8 hours given  the renal insufficiency Blood pressure control management of 120-140 with nicardipine drip ordered The goal will be dual anti-platelet therapy, for now with dose of 650 mg aspirin and the Integrilin drip. Electronically Signed   By: Corrie Mckusick D.O.   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 The antibiotic was administered within 1 hour of the procedure 75 MCG NITROGLYCERIN INTRA ARTERIAL 650 mg aspirin via orogastric tube. Intra procedural initiation of Integrilin IV drip for dual anti-platelet therapy. ANESTHESIA/SEDATION: General endotracheal tube anesthesia with anesthesia team CONTRAST:  144 cc IV contrast FLUOROSCOPY TIME:  Fluoroscopy Time: 54 minutes 6 seconds (2,926 mGy). COMPLICATIONS: None TECHNIQUE: Informed written consent was obtained from the patient's family after a thorough discussion of the procedural risks, benefits and alternatives. Specific risks discussed include: Bleeding, infection, contrast reaction, kidney injury/failure, need for further procedure/surgery, arterial injury or dissection, embolization to new  territory, intracranial hemorrhage (10-15% risk), neurologic deterioration, cardiopulmonary collapse, death. All questions were addressed. Maximal Sterile Barrier Technique was utilized including during the procedure including caps, mask, sterile gowns, sterile gloves, sterile drape, hand hygiene and skin antiseptic. A timeout was performed prior to the initiation of the procedure. FINDINGS: Initial angiogram: Left common carotid artery:  Normal course caliber and contour. Left external carotid artery: Patent with antegrade flow. On the initial injection before treatment, there is minimal opacification of the carotid siphon secondary to meningeal branches which appear to be region ating from ascending pharyngeal distribution and potentially deep meningeal branches from the external carotid artery. No significant filling of the MCA on the initial injection The initial injection does confirmed perfusion of the lenticulostriate vasculature, as well as a patent anterior coronal artery, with minimal filling of temporal lobe. Anterior basal ganglia structures are perfused. Left internal carotid artery: Internal carotid artery is occluded at the origin with a string sign at the posterior aspect of the artery. Left MCA: Initial injection demonstrates no significant filling of the middle cerebral artery. 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 Left ACA: Anterior cerebral artery perfused by the right-sided flow at the initiation. Final angiogram: After treatment of the carotid occlusion and deployment of 8 mm-6 mm x 40 mm carotid stent, there is excellent flow through the cervical carotid to the intracranial carotid. After mechanical thrombectomy of M1 occlusion, there is modified treatment in cerebral ischemia of 3. Flat panel CT: No complications with small contrast stain of the left basal ganglia. PROCEDURE: Patient was brought to the neurointerventional suite,  with the patient identity confirmed, and consent form confirmed. General anesthesia was present for initiation of general endotracheal tube anesthesia. Patient was prepped and draped in the usual sterile fashion.  The only venous access was a port catheter with single needle access. Ultrasound survey of the right inguinal region was performed with images stored and sent to PACs. 11 blade scalpel was used to make a small incision. A micropuncture needle was used access the right common femoral artery under ultrasound. With excellent arterial blood flow returned, an .018 micro wire was passed through the needle, observed to enter the abdominal aorta under fluoroscopy. The needle was removed, and a micropuncture sheath was placed over the wire. The inner dilator and wire were removed, and an 035 [place] wire was advanced under fluoroscopy into the abdominal aorta. The sheath was removed and a standard 5 [place] vascular sheath was placed. The dilator was removed and the sheath was flushed. After the initial common femoral artery access, ultrasound survey of the left inguinal region was performed in order to place arterial monitoring line. Images stored and sent to PACs. Micropuncture needle was used access the left common femoral artery under ultrasound. With excellent arterial blood flow returned, an .018 micro wire was passed through the needle, observed to enter the abdominal aorta under fluoroscopy. The needle was removed, and a micropuncture sheath was placed over the wire. The inner dilator and wire were removed, and an 035 Bentson wire was advanced under fluoroscopy into the abdominal aorta. The sheath was removed and a standard 4 [place] vascular sheath was placed. The dilator was removed and the sheath was flushed. A 27F JB-1 diagnostic catheter was advanced over the wire through the right common femoral artery access to the proximal descending thoracic aorta. Wire was then removed. Double flush of the catheter  was performed. Catheter was then used to select the left common carotid artery. Angiogram was performed. Using roadmap technique, the catheter was advanced over a roadrunner wire into right common carotid artery. Formal angiogram was performed. Catheter was advanced over the roadrunner wire into the external carotid artery. Wire was removed. Exchange length Rosen wire was then passed through the diagnostic catheter to the distal common carotid artery and the diagnostic catheter was removed. The 5 French sheath was removed and exchanged for 8 French 55 centimeter BrightTip sheath. Sheath was flushed and attached to pressurized and heparinized saline bag for constant forward flow. Then an 8 [place], 95 cm Flowgate balloon tip catheter was prepared on the back table with inflation of the balloon with 50/50 concentration of dilute contrast. The balloon catheter was then advanced over the wire, positioned into the distal common carotid artery. Copious back flush was performed and the balloon catheter was attached to heparinized and pressurized saline bag for forward flow. Roadmap was then performed at the carotid bifurcation. Combination of a rapid transit microwire and a synchro soft wire were used to navigate beyond the occlusion at the proximal internal carotid artery. The microcatheter was then navigated into the distal cervical segment, wire was removed, blood was aspirated, and a gentle injection confirmed a luminal position. Exchange length Transcend wire was then placed through the microcatheter into the cervical carotid. Microcatheter was removed. Balloon angioplasty was then performed with a rapid exchange system, using a Viatrac balloon 5 mm x 30 mm. The patient required treatment at this time with atropine dose, as we experienced bradycardia into the 30s. His heart rate recovered after treatment by the anesthesia team. Balloon was removed. Angiogram confirmed flow into the cervical carotid. A coaxial system of  an Ace 64 penumbra aspiration catheter and the Trevo pro view 18 catheter was then advanced over the  exchange length wire into the cervical carotid. Once the coaxial system was in the cervical carotid, the microwire was removed and exchanged for synchro soft. The synchro soft wire was navigated beyond the occlusion under roadmap guidance, and the aspiration catheter was advanced into the ICA terminus. The balloon guide catheter was then advanced into the mid segment of the cervical carotid artery. The microcatheter and microwire were then carefully advanced through the occluded segment. Microcatheter was then push through the occluded segment and the wire was removed. Blood was then aspirated through the hub of the microcatheter, and a gentle contrast injection was performed confirming intraluminal position. A rotating hemostatic valve was then attached to the back end of the microcatheter, and a pressurized and heparinized saline bag was attached to the catheter. 4 x 40 solitaire device was then selected. Back flush was achieved at the rotating hemostatic valve, and then the device was gently advanced through the microcatheter to the distal end. The retriever was then unsheathed by withdrawing the microcatheter under fluoroscopy. A 5 minutes interval was observed. At this time, ultrasound-guided access of the right common femoral vein was performed for placement of a additional venous access and placement of a 12 French triple-lumen IV catheter. We then proceeded with the mechanical thrombectomy. The balloon at the balloon tip catheter was then inflated under fluoroscopy for proximal flow arrest. Constant aspiration was then performed at the penumbra aspiration catheter at the carotid terminus as the retriever was gently and slowly withdrawn with fluoroscopic observation. Once the retriever was entirely removed from the system, free aspiration was confirmed at the hub of the aspiration catheter, with free blood  return confirmed. The balloon was then deflated, rotating hemostatic valve was reattached, and a control angiogram was performed, confirming restoration of flow. Penumbra catheter was then withdrawn, and the angiogram was performed at the carotid bifurcation. Given the appearance, we elected to place acute carotid stent. The lesion was then again navigated with synchro soft wire and rapid transit. With the microwire microcatheter positioned in the distal cervical carotid, the microwire was removed. The exchange length Transcend wire was then replaced and the microcatheter was removed. Acute carotid stent was then deployed, 8 mm-6 mm x 40 mm length. Before deployment, we initiated Integrilin drip treatment for dual anti-platelet therapy. Angiogram was performed. Coaxial system of the penumbra aspiration catheter and the pro view 18 catheter were then navigated to the carotid siphon for better angiogram of the cerebral vasculature. Angiogram was performed. Microcatheter, a number aspiration catheter were withdrawn. The balloon guide catheter was positioned in the common carotid artery for final angiogram. All catheter and wires were removed. The sheath at the right common femoral artery access was withdrawn for an angiogram. Eight French Angio-Seal was then deployed for hemostasis. A flat panel CT was then performed.  Patient was then extubated. Estimated blood loss was 50 cc No complications encountered. IMPRESSION: Status post left-sided cerebral angiogram with treatment of left ICA/MCA tandem occlusion, requiring acute left carotid stenting and mechanical thrombectomy of a left M1 occlusion with achievement of mTICI-3 flow, and restoration of cervical ICA flow. Flat panel CT in the room demonstrates small contrast stain without complicating features, and the patient was extubated. Status post deployment of 8 French Angio-Seal for right common femoral artery hemostasis. Status post ultrasound-guided left common  femoral artery access with a 4 French sheath for arterial monitoring, which remained after the case. Status post ultrasound-guided right common femoral vein access for additional IV access, which remained  after the case transmitting the Integrilin drip. Signed, Dulcy Fanny. Earleen Newport, DO [facility] Radiology PLAN: Patient will go to the PACU ICU admission Bilateral hips will be straight until the left 4 French arterial monitoring sheath is removed and the right common femoral vein venous access sheath is removed. Routine wound management for the right common femoral artery sheath access. Frequent neurovascular checks. IV saline hydration for 8 hours given the renal insufficiency Blood pressure control management of 120-140 with nicardipine drip ordered The goal will be dual anti-platelet therapy, for now with dose of 650 mg aspirin and the Integrilin drip. Electronically Signed   By: Corrie Mckusick D.O.   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 The antibiotic was administered within 1 hour of the procedure 75 MCG  NITROGLYCERIN INTRA ARTERIAL 650 mg aspirin via orogastric tube. Intra procedural initiation of Integrilin IV drip for dual anti-platelet therapy. ANESTHESIA/SEDATION: General endotracheal tube anesthesia with anesthesia team CONTRAST:  144 cc IV contrast FLUOROSCOPY TIME:  Fluoroscopy Time: 54 minutes 6 seconds (2,926 mGy). COMPLICATIONS: None TECHNIQUE: Informed written consent was obtained from the patient's family after a thorough discussion of the procedural risks, benefits and alternatives. Specific risks discussed include: Bleeding, infection, contrast reaction, kidney injury/failure, need for further procedure/surgery, arterial injury or dissection, embolization to new territory, intracranial hemorrhage (10-15% risk), neurologic deterioration, cardiopulmonary collapse, death. All questions were addressed. Maximal Sterile Barrier Technique was utilized including during the procedure including caps, mask, sterile gowns, sterile gloves, sterile drape, hand hygiene and skin antiseptic. A timeout was performed prior to the initiation of the procedure. FINDINGS: Initial angiogram: Left common carotid artery:  Normal course caliber and contour. Left external carotid artery: Patent with antegrade flow. On the initial injection before treatment, there is minimal opacification of the carotid siphon secondary to meningeal branches which appear to be region ating from ascending pharyngeal distribution and potentially deep meningeal branches from the external carotid artery. No significant filling of the MCA on the initial injection The initial injection does confirmed perfusion of the lenticulostriate vasculature, as well as a patent anterior coronal artery, with minimal filling of temporal lobe. Anterior basal ganglia structures are perfused. Left internal carotid artery: Internal carotid artery is occluded at the origin with a string sign at the posterior aspect of the artery. Left MCA: Initial injection demonstrates  no significant filling of the middle cerebral artery. 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 Left ACA: Anterior cerebral artery perfused by the right-sided flow at the initiation. Final angiogram: After treatment of the carotid occlusion and deployment of 8 mm-6 mm x 40 mm carotid stent, there is excellent flow through the cervical carotid to the intracranial carotid. After mechanical  thrombectomy of M1 occlusion, there is modified treatment in cerebral ischemia of 3. Flat panel CT: No complications with small contrast stain of the left basal ganglia. PROCEDURE: Patient was brought to the neurointerventional suite, with the patient identity confirmed, and consent form confirmed. General anesthesia was present for initiation of general endotracheal tube anesthesia. Patient was prepped and draped in the usual sterile fashion. The only venous access was a port catheter with single needle access. Ultrasound survey of the right inguinal region was performed with images stored and sent to PACs. 11 blade scalpel was used to make a small incision. A micropuncture needle was used access the right common femoral artery under ultrasound. With excellent arterial blood flow returned, an .018 micro wire was passed through the needle, observed to enter the abdominal aorta under fluoroscopy. The needle was removed, and a micropuncture sheath was placed over the wire. The inner dilator and wire were removed, and an 035 Bentson wire was advanced under fluoroscopy into the abdominal aorta. The sheath was removed and a standard 5 [place] vascular sheath was placed. The dilator was removed and the sheath was flushed. After the initial common femoral artery access, ultrasound survey of the left inguinal region was performed in order to place arterial monitoring line. Images stored and sent to PACs. Micropuncture needle was used access the left common femoral artery under  ultrasound. With excellent arterial blood flow returned, an .018 micro wire was passed through the needle, observed to enter the abdominal aorta under fluoroscopy. The needle was removed, and a micropuncture sheath was placed over the wire. The inner dilator and wire were removed, and an 035 Bentson wire was advanced under fluoroscopy into the abdominal aorta. The sheath was removed and a standard 4 [place] vascular sheath was placed. The dilator was removed and the sheath was flushed. A 62F JB-1 diagnostic catheter was advanced over the wire through the right common femoral artery access to the proximal descending thoracic aorta. Wire was then removed. Double flush of the catheter was performed. Catheter was then used to select the left common carotid artery. Angiogram was performed. Using roadmap technique, the catheter was advanced over a roadrunner wire into right common carotid artery. Formal angiogram was performed. Catheter was advanced over the roadrunner wire into the external carotid artery. Wire was removed. Exchange length Rosen wire was then passed through the diagnostic catheter to the distal common carotid artery and the diagnostic catheter was removed. The 5 French sheath was removed and exchanged for 8 French 55 centimeter BrightTip sheath. Sheath was flushed and attached to pressurized and heparinized saline bag for constant forward flow. Then an 8 [place], 95 cm Flowgate balloon tip catheter was prepared on the back table with inflation of the balloon with 50/50 concentration of dilute contrast. The balloon catheter was then advanced over the wire, positioned into the distal common carotid artery. Copious back flush was performed and the balloon catheter was attached to heparinized and pressurized saline bag for forward flow. Roadmap was then performed at the carotid bifurcation. Combination of a rapid transit microwire and a synchro soft wire were used to navigate beyond the occlusion at the  proximal internal carotid artery. The microcatheter was then navigated into the distal cervical segment, wire was removed, blood was aspirated, and a gentle injection confirmed a luminal position. Exchange length Transcend wire was then placed through the microcatheter into the cervical carotid. Microcatheter was removed. Balloon angioplasty was then performed with a rapid exchange system, using a Ecologist  balloon 5 mm x 30 mm. The patient required treatment at this time with atropine dose, as we experienced bradycardia into the 30s. His heart rate recovered after treatment by the anesthesia team. Balloon was removed. Angiogram confirmed flow into the cervical carotid. A coaxial system of an Ace 64 penumbra aspiration catheter and the Trevo pro view 18 catheter was then advanced over the exchange length wire into the cervical carotid. Once the coaxial system was in the cervical carotid, the microwire was removed and exchanged for synchro soft. The synchro soft wire was navigated beyond the occlusion under roadmap guidance, and the aspiration catheter was advanced into the ICA terminus. The balloon guide catheter was then advanced into the mid segment of the cervical carotid artery. The microcatheter and microwire were then carefully advanced through the occluded segment. Microcatheter was then push through the occluded segment and the wire was removed. Blood was then aspirated through the hub of the microcatheter, and a gentle contrast injection was performed confirming intraluminal position. A rotating hemostatic valve was then attached to the back end of the microcatheter, and a pressurized and heparinized saline bag was attached to the catheter. 4 x 40 solitaire device was then selected. Back flush was achieved at the rotating hemostatic valve, and then the device was gently advanced through the microcatheter to the distal end. The retriever was then unsheathed by withdrawing the microcatheter under fluoroscopy. A  5 minutes interval was observed. At this time, ultrasound-guided access of the right common femoral vein was performed for placement of a additional venous access and placement of a 12 French triple-lumen IV catheter. We then proceeded with the mechanical thrombectomy. The balloon at the balloon tip catheter was then inflated under fluoroscopy for proximal flow arrest. Constant aspiration was then performed at the penumbra aspiration catheter at the carotid terminus as the retriever was gently and slowly withdrawn with fluoroscopic observation. Once the retriever was entirely removed from the system, free aspiration was confirmed at the hub of the aspiration catheter, with free blood return confirmed. The balloon was then deflated, rotating hemostatic valve was reattached, and a control angiogram was performed, confirming restoration of flow. Penumbra catheter was then withdrawn, and the angiogram was performed at the carotid bifurcation. Given the appearance, we elected to place acute carotid stent. The lesion was then again navigated with synchro soft wire and rapid transit. With the microwire microcatheter positioned in the distal cervical carotid, the microwire was removed. The exchange length Transcend wire was then replaced and the microcatheter was removed. Acute carotid stent was then deployed, 8 mm-6 mm x 40 mm length. Before deployment, we initiated Integrilin drip treatment for dual anti-platelet therapy. Angiogram was performed. Coaxial system of the penumbra aspiration catheter and the pro view 18 catheter were then navigated to the carotid siphon for better angiogram of the cerebral vasculature. Angiogram was performed. Microcatheter, a number aspiration catheter were withdrawn. The balloon guide catheter was positioned in the common carotid artery for final angiogram. All catheter and wires were removed. The sheath at the right common femoral artery access was withdrawn for an angiogram. Eight French  Angio-Seal was then deployed for hemostasis. A flat panel CT was then performed.  Patient was then extubated. Estimated blood loss was 50 cc No complications encountered. IMPRESSION: Status post left-sided cerebral angiogram with treatment of left ICA/MCA tandem occlusion, requiring acute left carotid stenting and mechanical thrombectomy of a left M1 occlusion with achievement of mTICI-3 flow, and restoration of cervical ICA flow. Flat  panel CT in the room demonstrates small contrast stain without complicating features, and the patient was extubated. Status post deployment of 8 French Angio-Seal for right common femoral artery hemostasis. Status post ultrasound-guided left common femoral artery access with a 4 French sheath for arterial monitoring, which remained after the case. Status post ultrasound-guided right common femoral vein access for additional IV access, which remained after the case transmitting the Integrilin drip. Signed, Dulcy Fanny. Earleen Newport, DO [facility] Radiology PLAN: Patient will go to the PACU ICU admission Bilateral hips will be straight until the left 4 French arterial monitoring sheath is removed and the right common femoral vein venous access sheath is removed. Routine wound management for the right common femoral artery sheath access. Frequent neurovascular checks. IV saline hydration for 8 hours given the renal insufficiency Blood pressure control management of 120-140 with nicardipine drip ordered The goal will be dual anti-platelet therapy, for now with dose of 650 mg aspirin and the Integrilin drip. Electronically Signed   By: Corrie Mckusick D.O.   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 The antibiotic was administered within 1 hour of the procedure 75 MCG NITROGLYCERIN INTRA ARTERIAL 650 mg aspirin via orogastric tube. Intra procedural initiation of Integrilin IV drip for dual anti-platelet therapy. ANESTHESIA/SEDATION: General endotracheal tube anesthesia with anesthesia team CONTRAST:  144 cc IV contrast FLUOROSCOPY TIME:  Fluoroscopy Time: 54 minutes 6 seconds (2,926 mGy). COMPLICATIONS: None TECHNIQUE: Informed written consent was obtained from the patient's family after a thorough discussion of the procedural risks, benefits and alternatives. Specific risks discussed include: Bleeding, infection, contrast reaction, kidney injury/failure, need for further procedure/surgery, arterial injury or dissection, embolization to new territory, intracranial hemorrhage (10-15% risk), neurologic deterioration, cardiopulmonary collapse, death. All questions were addressed. Maximal Sterile Barrier Technique was utilized including during the procedure including caps, mask, sterile gowns, sterile gloves, sterile drape, hand hygiene and skin antiseptic. A timeout was performed prior to the initiation of the procedure. FINDINGS: Initial angiogram: Left common carotid artery:  Normal course caliber and contour. Left external carotid artery: Patent with antegrade flow. On the initial injection before treatment,  there is minimal opacification of the carotid siphon secondary to meningeal branches which appear to be region ating from ascending pharyngeal distribution and potentially deep meningeal branches from the external carotid artery. No significant filling of the MCA on the initial injection The initial injection does confirmed perfusion of the lenticulostriate vasculature, as well as a patent anterior coronal artery, with minimal filling of temporal lobe. Anterior basal ganglia structures are perfused. Left internal carotid artery: Internal carotid artery is occluded at the origin with a string sign at the posterior aspect of the artery. Left MCA: Initial injection demonstrates no significant filling of the  middle cerebral artery. 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 Left ACA: Anterior cerebral artery perfused by the right-sided flow at the initiation. Final angiogram: After treatment of the carotid occlusion and deployment of 8 mm-6 mm x 40 mm carotid stent, there is excellent flow through the cervical carotid to the intracranial carotid. After mechanical thrombectomy of M1 occlusion, there is modified treatment in cerebral ischemia of 3. Flat panel CT: No complications with small contrast stain of the left basal ganglia. PROCEDURE: Patient was brought to the neurointerventional suite, with the patient identity confirmed, and consent form confirmed. General anesthesia was present for initiation of general endotracheal tube anesthesia. Patient was prepped and draped in the usual sterile fashion. The only venous access was a port catheter with single needle access. Ultrasound survey of the right inguinal region was performed with images stored and sent to PACs. 11 blade scalpel was used to make a small incision. A micropuncture needle was used access the right common femoral artery under ultrasound. With excellent arterial blood flow returned, an .018  micro wire was passed through the needle, observed to enter the abdominal aorta under fluoroscopy. The needle was removed, and a micropuncture sheath was placed over the wire. The inner dilator and wire were removed, and an 035 Bentson wire was advanced under fluoroscopy into the abdominal aorta. The sheath was removed and a standard 5 [place] vascular sheath was placed. The dilator was removed and the sheath was flushed. After the initial common femoral artery access, ultrasound survey of the left inguinal region was performed in order to place arterial monitoring line. Images stored and sent to PACs. Micropuncture needle was used access the left common femoral artery under ultrasound. With excellent arterial blood flow returned, an .018 micro wire was passed through the needle, observed to enter the abdominal aorta under fluoroscopy. The needle was removed, and a micropuncture sheath was placed over the wire. The inner dilator and wire were removed, and an 035 Bentson wire was advanced under fluoroscopy into the abdominal aorta. The sheath was removed and a standard 4 [place] vascular sheath was placed. The dilator was removed and the sheath was flushed. A 1F JB-1 diagnostic catheter was advanced over the wire through the right common femoral artery access to the proximal descending thoracic aorta. Wire was then removed. Double flush of the catheter was performed. Catheter was then used to select the left common carotid artery. Angiogram was performed. Using roadmap technique, the catheter was advanced over a roadrunner wire into right common carotid artery. Formal angiogram was performed. Catheter was advanced over the roadrunner wire into the external carotid artery. Wire was removed. Exchange length Rosen wire was then passed through the diagnostic catheter to the distal common carotid artery and the diagnostic catheter was removed. The 5 French sheath was removed and exchanged for 8 French 55 centimeter  BrightTip sheath. Sheath was flushed and attached to pressurized and heparinized saline bag for constant forward flow. Then an 8 [place], 95 cm Flowgate balloon tip catheter was prepared on the back table with inflation of the balloon with 50/50 concentration of dilute contrast. The balloon catheter was then advanced over the wire, positioned into the distal common carotid artery. Copious back flush was performed and the balloon catheter was attached to heparinized and pressurized saline bag for forward flow. Roadmap was then performed at the carotid bifurcation. Combination of a rapid transit microwire and a synchro soft wire were used  to navigate beyond the occlusion at the proximal internal carotid artery. The microcatheter was then navigated into the distal cervical segment, wire was removed, blood was aspirated, and a gentle injection confirmed a luminal position. Exchange length Transcend wire was then placed through the microcatheter into the cervical carotid. Microcatheter was removed. Balloon angioplasty was then performed with a rapid exchange system, using a Viatrac balloon 5 mm x 30 mm. The patient required treatment at this time with atropine dose, as we experienced bradycardia into the 30s. His heart rate recovered after treatment by the anesthesia team. Balloon was removed. Angiogram confirmed flow into the cervical carotid. A coaxial system of an Ace 64 penumbra aspiration catheter and the Trevo pro view 18 catheter was then advanced over the exchange length wire into the cervical carotid. Once the coaxial system was in the cervical carotid, the microwire was removed and exchanged for synchro soft. The synchro soft wire was navigated beyond the occlusion under roadmap guidance, and the aspiration catheter was advanced into the ICA terminus. The balloon guide catheter was then advanced into the mid segment of the cervical carotid artery. The microcatheter and microwire were then carefully advanced  through the occluded segment. Microcatheter was then push through the occluded segment and the wire was removed. Blood was then aspirated through the hub of the microcatheter, and a gentle contrast injection was performed confirming intraluminal position. A rotating hemostatic valve was then attached to the back end of the microcatheter, and a pressurized and heparinized saline bag was attached to the catheter. 4 x 40 solitaire device was then selected. Back flush was achieved at the rotating hemostatic valve, and then the device was gently advanced through the microcatheter to the distal end. The retriever was then unsheathed by withdrawing the microcatheter under fluoroscopy. A 5 minutes interval was observed. At this time, ultrasound-guided access of the right common femoral vein was performed for placement of a additional venous access and placement of a 12 French triple-lumen IV catheter. We then proceeded with the mechanical thrombectomy. The balloon at the balloon tip catheter was then inflated under fluoroscopy for proximal flow arrest. Constant aspiration was then performed at the penumbra aspiration catheter at the carotid terminus as the retriever was gently and slowly withdrawn with fluoroscopic observation. Once the retriever was entirely removed from the system, free aspiration was confirmed at the hub of the aspiration catheter, with free blood return confirmed. The balloon was then deflated, rotating hemostatic valve was reattached, and a control angiogram was performed, confirming restoration of flow. Penumbra catheter was then withdrawn, and the angiogram was performed at the carotid bifurcation. Given the appearance, we elected to place acute carotid stent. The lesion was then again navigated with synchro soft wire and rapid transit. With the microwire microcatheter positioned in the distal cervical carotid, the microwire was removed. The exchange length Transcend wire was then replaced and the  microcatheter was removed. Acute carotid stent was then deployed, 8 mm-6 mm x 40 mm length. Before deployment, we initiated Integrilin drip treatment for dual anti-platelet therapy. Angiogram was performed. Coaxial system of the penumbra aspiration catheter and the pro view 18 catheter were then navigated to the carotid siphon for better angiogram of the cerebral vasculature. Angiogram was performed. Microcatheter, a number aspiration catheter were withdrawn. The balloon guide catheter was positioned in the common carotid artery for final angiogram. All catheter and wires were removed. The sheath at the right common femoral artery access was withdrawn for an angiogram. Eight [place]  Angio-Seal was then deployed for hemostasis. A flat panel CT was then performed.  Patient was then extubated. Estimated blood loss was 50 cc No complications encountered. IMPRESSION: Status post left-sided cerebral angiogram with treatment of left ICA/MCA tandem occlusion, requiring acute left carotid stenting and mechanical thrombectomy of a left M1 occlusion with achievement of mTICI-3 flow, and restoration of cervical ICA flow. Flat panel CT in the room demonstrates small contrast stain without complicating features, and the patient was extubated. Status post deployment of 8 French Angio-Seal for right common femoral artery hemostasis. Status post ultrasound-guided left common femoral artery access with a 4 French sheath for arterial monitoring, which remained after the case. Status post ultrasound-guided right common femoral vein access for additional IV access, which remained after the case transmitting the Integrilin drip. Signed, Dulcy Fanny. Earleen Newport, DO [facility] Radiology PLAN: Patient will go to the PACU ICU admission Bilateral hips will be straight until the left 4 French arterial monitoring sheath is removed and the right common femoral vein venous access sheath is removed. Routine  wound management for the right common femoral artery sheath access. Frequent neurovascular checks. IV saline hydration for 8 hours given the renal insufficiency Blood pressure control management of 120-140 with nicardipine drip ordered The goal will be dual anti-platelet therapy, for now with dose of 650 mg aspirin and the Integrilin drip. Electronically Signed   By: Corrie Mckusick D.O.   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 The antibiotic was administered within 1 hour of the procedure 75 MCG NITROGLYCERIN INTRA ARTERIAL 650 mg aspirin via orogastric tube. Intra procedural initiation of Integrilin IV drip for dual anti-platelet therapy. ANESTHESIA/SEDATION: General endotracheal tube anesthesia with anesthesia team CONTRAST:  144 cc IV contrast FLUOROSCOPY TIME:  Fluoroscopy Time: 54 minutes 6 seconds (2,926 mGy). COMPLICATIONS: None TECHNIQUE: Informed written consent was obtained from the patient's family after a thorough discussion of the procedural risks, benefits and alternatives. Specific risks discussed include: Bleeding, infection, contrast reaction,  kidney injury/failure, need for further procedure/surgery, arterial injury or dissection, embolization to new territory, intracranial hemorrhage (10-15% risk), neurologic deterioration, cardiopulmonary collapse, death. All questions were addressed. Maximal Sterile Barrier Technique was utilized including during the procedure including caps, mask, sterile gowns, sterile gloves, sterile drape, hand hygiene and skin antiseptic. A timeout was performed prior to the initiation of the procedure. FINDINGS: Initial angiogram: Left common carotid artery:  Normal course caliber and contour. Left external carotid artery: Patent with antegrade flow. On the initial injection before treatment, there is minimal opacification of the carotid siphon secondary to meningeal branches which appear to be region ating from ascending pharyngeal distribution and potentially deep meningeal branches from the external carotid artery. No significant filling of the MCA on the  initial injection The initial injection does confirmed perfusion of the lenticulostriate vasculature, as well as a patent anterior coronal artery, with minimal filling of temporal lobe. Anterior basal ganglia structures are perfused. Left internal carotid artery: Internal carotid artery is occluded at the origin with a string sign at the posterior aspect of the artery. Left MCA: Initial injection demonstrates no significant filling of the middle cerebral artery. 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 Left ACA: Anterior cerebral artery perfused by the right-sided flow at the initiation. Final angiogram: After treatment of the carotid occlusion and deployment of 8 mm-6 mm x 40 mm carotid stent, there is excellent flow through the cervical carotid to the intracranial carotid. After mechanical thrombectomy of M1 occlusion, there is modified treatment in cerebral ischemia of 3. Flat panel CT: No complications with  small contrast stain of the left basal ganglia. PROCEDURE: Patient was brought to the neurointerventional suite, with the patient identity confirmed, and consent form confirmed. General anesthesia was present for initiation of general endotracheal tube anesthesia. Patient was prepped and draped in the usual sterile fashion. The only venous access was a port catheter with single needle access. Ultrasound survey of the right inguinal region was performed with images stored and sent to PACs. 11 blade scalpel was used to make a small incision. A micropuncture needle was used access the right common femoral artery under ultrasound. With excellent arterial blood flow returned, an .018 micro wire was passed through the needle, observed to enter the abdominal aorta under fluoroscopy. The needle was removed, and a micropuncture sheath was placed over the wire. The inner dilator and wire were removed, and an 035 Bentson wire was advanced under fluoroscopy into the abdominal aorta. The sheath was removed and a standard 5 [place] vascular sheath was placed. The dilator was removed and the sheath was flushed. After the initial common femoral artery access, ultrasound survey of the left inguinal region was performed in order to place arterial monitoring line. Images stored and sent to PACs. Micropuncture needle was used access the left common femoral artery under ultrasound. With excellent arterial blood flow returned, an .018 micro wire was passed through the needle, observed to enter the abdominal aorta under fluoroscopy. The needle was removed, and a micropuncture sheath was placed over the wire. The inner dilator and wire were removed, and an 035 Bentson wire was advanced under fluoroscopy into the abdominal aorta. The sheath was removed and a standard 4 [place] vascular sheath was placed. The dilator was removed and the sheath was flushed. A 59F JB-1 diagnostic catheter was advanced over the wire through the right common  femoral artery access to the proximal descending thoracic aorta. Wire was then removed. Double flush of the catheter was performed. Catheter was then used to select the left common carotid artery. Angiogram was performed. Using roadmap technique, the catheter was advanced over a roadrunner wire into right common carotid artery. Formal angiogram was performed. Catheter was advanced over the roadrunner wire into the external carotid artery. Wire was removed. Exchange length Rosen wire was then passed through the diagnostic catheter to the distal common carotid artery and the diagnostic catheter was removed. The 5 French sheath was removed and exchanged for 8 French 55 centimeter BrightTip sheath. Sheath was flushed and attached to pressurized and heparinized saline bag for constant forward flow. Then an 8 [place], 95 cm Flowgate balloon tip catheter was prepared on the back table with inflation of  the balloon with 50/50 concentration of dilute contrast. The balloon catheter was then advanced over the wire, positioned into the distal common carotid artery. Copious back flush was performed and the balloon catheter was attached to heparinized and pressurized saline bag for forward flow. Roadmap was then performed at the carotid bifurcation. Combination of a rapid transit microwire and a synchro soft wire were used to navigate beyond the occlusion at the proximal internal carotid artery. The microcatheter was then navigated into the distal cervical segment, wire was removed, blood was aspirated, and a gentle injection confirmed a luminal position. Exchange length Transcend wire was then placed through the microcatheter into the cervical carotid. Microcatheter was removed. Balloon angioplasty was then performed with a rapid exchange system, using a Viatrac balloon 5 mm x 30 mm. The patient required treatment at this time with atropine dose, as we experienced bradycardia into the 30s. His heart rate recovered after treatment  by the anesthesia team. Balloon was removed. Angiogram confirmed flow into the cervical carotid. A coaxial system of an Ace 64 penumbra aspiration catheter and the Trevo pro view 18 catheter was then advanced over the exchange length wire into the cervical carotid. Once the coaxial system was in the cervical carotid, the microwire was removed and exchanged for synchro soft. The synchro soft wire was navigated beyond the occlusion under roadmap guidance, and the aspiration catheter was advanced into the ICA terminus. The balloon guide catheter was then advanced into the mid segment of the cervical carotid artery. The microcatheter and microwire were then carefully advanced through the occluded segment. Microcatheter was then push through the occluded segment and the wire was removed. Blood was then aspirated through the hub of the microcatheter, and a gentle contrast injection was performed confirming intraluminal position. A rotating hemostatic valve was then attached to the back end of the microcatheter, and a pressurized and heparinized saline bag was attached to the catheter. 4 x 40 solitaire device was then selected. Back flush was achieved at the rotating hemostatic valve, and then the device was gently advanced through the microcatheter to the distal end. The retriever was then unsheathed by withdrawing the microcatheter under fluoroscopy. A 5 minutes interval was observed. At this time, ultrasound-guided access of the right common femoral vein was performed for placement of a additional venous access and placement of a 12 French triple-lumen IV catheter. We then proceeded with the mechanical thrombectomy. The balloon at the balloon tip catheter was then inflated under fluoroscopy for proximal flow arrest. Constant aspiration was then performed at the penumbra aspiration catheter at the carotid terminus as the retriever was gently and slowly withdrawn with fluoroscopic observation. Once the retriever was  entirely removed from the system, free aspiration was confirmed at the hub of the aspiration catheter, with free blood return confirmed. The balloon was then deflated, rotating hemostatic valve was reattached, and a control angiogram was performed, confirming restoration of flow. Penumbra catheter was then withdrawn, and the angiogram was performed at the carotid bifurcation. Given the appearance, we elected to place acute carotid stent. The lesion was then again navigated with synchro soft wire and rapid transit. With the microwire microcatheter positioned in the distal cervical carotid, the microwire was removed. The exchange length Transcend wire was then replaced and the microcatheter was removed. Acute carotid stent was then deployed, 8 mm-6 mm x 40 mm length. Before deployment, we initiated Integrilin drip treatment for dual anti-platelet therapy. Angiogram was performed. Coaxial system of the penumbra aspiration catheter and  the pro view 18 catheter were then navigated to the carotid siphon for better angiogram of the cerebral vasculature. Angiogram was performed. Microcatheter, a number aspiration catheter were withdrawn. The balloon guide catheter was positioned in the common carotid artery for final angiogram. All catheter and wires were removed. The sheath at the right common femoral artery access was withdrawn for an angiogram. Eight French Angio-Seal was then deployed for hemostasis. A flat panel CT was then performed.  Patient was then extubated. Estimated blood loss was 50 cc No complications encountered. IMPRESSION: Status post left-sided cerebral angiogram with treatment of left ICA/MCA tandem occlusion, requiring acute left carotid stenting and mechanical thrombectomy of a left M1 occlusion with achievement of mTICI-3 flow, and restoration of cervical ICA flow. Flat panel CT in the room demonstrates small contrast stain without complicating features, and the patient was extubated. Status post  deployment of 8 French Angio-Seal for right common femoral artery hemostasis. Status post ultrasound-guided left common femoral artery access with a 4 French sheath for arterial monitoring, which remained after the case. Status post ultrasound-guided right common femoral vein access for additional IV access, which remained after the case transmitting the Integrilin drip. Signed, Dulcy Fanny. Earleen Newport, DO [facility] PLAN: Patient will go to the PACU ICU admission Bilateral hips will be straight until the left 4 French arterial monitoring sheath is removed and the right common femoral vein venous access sheath is removed. Routine wound management for the right common femoral artery sheath access. Frequent neurovascular checks. IV saline hydration for 8 hours given the renal insufficiency Blood pressure control management of 120-140 with nicardipine drip ordered The goal will be dual anti-platelet therapy, for now with dose of 650 mg aspirin and the Integrilin drip. Electronically Signed   By: Corrie Mckusick D.O.   On: 05/06/2017 09:24   Dg Chest Port 1 View  Result Date: 05/06/2017 CLINICAL DATA:  Shortness of breath. Patient status post left MCA thrombectomy and left ICA stent placement today. EXAM: PORTABLE CHEST 1 VIEW COMPARISON:  PA and lateral chest 03/18/2017.  CT chest 03/24/2017. FINDINGS: Interstitial prominence about the patient's left upper lobe pulmonary mass is worse than on the prior plain films of the chest. Mild basilar atelectasis is noted. Heart size is normal. Port-A-Cath is in place. Aortic atherosclerosis is seen. IMPRESSION: Increased interstitial opacities about the patient's left upper lobe pulmonary mass are nonspecific and could be due to tumor spread, postobstructive pneumonitis or less likely hemorrhage. Atherosclerosis. Electronically Signed   By: Inge Rise M.D.   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 The antibiotic was administered within 1 hour of the procedure 75 MCG NITROGLYCERIN INTRA ARTERIAL 650 mg aspirin via orogastric tube. Intra procedural initiation  of Integrilin IV drip for dual anti-platelet therapy. ANESTHESIA/SEDATION: General endotracheal tube anesthesia with anesthesia team CONTRAST:  144 cc IV contrast FLUOROSCOPY TIME:  Fluoroscopy Time: 54 minutes 6 seconds (2,926 mGy). COMPLICATIONS: None TECHNIQUE: Informed written consent was obtained from the patient's family after a thorough discussion of the procedural risks, benefits and alternatives. Specific risks discussed include: Bleeding, infection, contrast reaction, kidney injury/failure, need for further procedure/surgery, arterial injury or dissection, embolization to new territory, intracranial hemorrhage (10-15% risk), neurologic deterioration, cardiopulmonary collapse, death. All questions were addressed. Maximal Sterile Barrier Technique was utilized including during the procedure including caps, mask, sterile gowns, sterile gloves, sterile drape, hand  hygiene and skin antiseptic. A timeout was performed prior to the initiation of the procedure. FINDINGS: Initial angiogram: Left common carotid artery:  Normal course caliber and contour. Left external carotid artery: Patent with antegrade flow. On the initial injection before treatment, there is minimal opacification of the carotid siphon secondary to meningeal branches which appear to be region ating from ascending pharyngeal distribution and potentially deep meningeal branches from the external carotid artery. No significant filling of the MCA on the initial injection The initial injection does confirmed perfusion of the lenticulostriate vasculature, as well as a patent anterior coronal artery, with minimal filling of temporal lobe. Anterior basal ganglia structures are perfused. Left internal carotid artery: Internal carotid artery is occluded at the origin with a string sign at the posterior aspect of the artery. Left MCA: Initial injection demonstrates no significant filling of the middle cerebral artery. 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 Left ACA: Anterior cerebral artery perfused by the right-sided flow at the initiation. Final angiogram: After treatment of the carotid occlusion and deployment of 8 mm-6 mm x 40 mm carotid stent, there is excellent flow through the cervical carotid to the intracranial carotid. After mechanical thrombectomy of M1 occlusion, there is modified treatment in cerebral ischemia of 3. Flat panel CT: No complications with small contrast stain of the left basal ganglia. PROCEDURE: Patient was brought to the neurointerventional suite, with the patient identity confirmed, and consent form confirmed. General anesthesia was present for initiation of general endotracheal tube anesthesia. Patient was prepped and draped in the usual sterile fashion. The only venous access was a port catheter with single needle access.  Ultrasound survey of the right inguinal region was performed with images stored and sent to PACs. 11 blade scalpel was used to make a small incision. A micropuncture needle was used access the right common femoral artery under ultrasound. With excellent arterial blood flow returned, an .018 micro wire was passed through the needle, observed to enter the abdominal aorta under fluoroscopy. The needle was removed, and a micropuncture sheath was placed over the wire. The inner dilator and wire were removed, and an 035 Bentson wire was advanced under fluoroscopy into the abdominal aorta. The sheath was removed and a standard 5 [place] vascular sheath was placed. The dilator was removed and the sheath was flushed. After the initial common femoral artery access, ultrasound survey of the left inguinal region was performed in order to place arterial monitoring line. Images stored and sent to PACs. Micropuncture needle was used access the left common femoral artery under ultrasound. With excellent arterial blood flow returned, an .018 micro wire was passed through the needle, observed to enter the abdominal aorta under fluoroscopy. The needle was removed, and a micropuncture sheath was placed over the wire. The inner dilator and  wire were removed, and an 035 Bentson wire was advanced under fluoroscopy into the abdominal aorta. The sheath was removed and a standard 4 [place] vascular sheath was placed. The dilator was removed and the sheath was flushed. A 29F JB-1 diagnostic catheter was advanced over the wire through the right common femoral artery access to the proximal descending thoracic aorta. Wire was then removed. Double flush of the catheter was performed. Catheter was then used to select the left common carotid artery. Angiogram was performed. Using roadmap technique, the catheter was advanced over a roadrunner wire into right common carotid artery. Formal angiogram was performed. Catheter was advanced over the  roadrunner wire into the external carotid artery. Wire was removed. Exchange length Rosen wire was then passed through the diagnostic catheter to the distal common carotid artery and the diagnostic catheter was removed. The 5 French sheath was removed and exchanged for 8 French 55 centimeter BrightTip sheath. Sheath was flushed and attached to pressurized and heparinized saline bag for constant forward flow. Then an 8 [place], 95 cm Flowgate balloon tip catheter was prepared on the back table with inflation of the balloon with 50/50 concentration of dilute contrast. The balloon catheter was then advanced over the wire, positioned into the distal common carotid artery. Copious back flush was performed and the balloon catheter was attached to heparinized and pressurized saline bag for forward flow. Roadmap was then performed at the carotid bifurcation. Combination of a rapid transit microwire and a synchro soft wire were used to navigate beyond the occlusion at the proximal internal carotid artery. The microcatheter was then navigated into the distal cervical segment, wire was removed, blood was aspirated, and a gentle injection confirmed a luminal position. Exchange length Transcend wire was then placed through the microcatheter into the cervical carotid. Microcatheter was removed. Balloon angioplasty was then performed with a rapid exchange system, using a Viatrac balloon 5 mm x 30 mm. The patient required treatment at this time with atropine dose, as we experienced bradycardia into the 30s. His heart rate recovered after treatment by the anesthesia team. Balloon was removed. Angiogram confirmed flow into the cervical carotid. A coaxial system of an Ace 64 penumbra aspiration catheter and the Trevo pro view 18 catheter was then advanced over the exchange length wire into the cervical carotid. Once the coaxial system was in the cervical carotid, the microwire was removed and exchanged for synchro soft. The synchro  soft wire was navigated beyond the occlusion under roadmap guidance, and the aspiration catheter was advanced into the ICA terminus. The balloon guide catheter was then advanced into the mid segment of the cervical carotid artery. The microcatheter and microwire were then carefully advanced through the occluded segment. Microcatheter was then push through the occluded segment and the wire was removed. Blood was then aspirated through the hub of the microcatheter, and a gentle contrast injection was performed confirming intraluminal position. A rotating hemostatic valve was then attached to the back end of the microcatheter, and a pressurized and heparinized saline bag was attached to the catheter. 4 x 40 solitaire device was then selected. Back flush was achieved at the rotating hemostatic valve, and then the device was gently advanced through the microcatheter to the distal end. The retriever was then unsheathed by withdrawing the microcatheter under fluoroscopy. A 5 minutes interval was observed. At this time, ultrasound-guided access of the right common femoral vein was performed for placement of a additional venous access and placement of a 12 French triple-lumen IV  catheter. We then proceeded with the mechanical thrombectomy. The balloon at the balloon tip catheter was then inflated under fluoroscopy for proximal flow arrest. Constant aspiration was then performed at the penumbra aspiration catheter at the carotid terminus as the retriever was gently and slowly withdrawn with fluoroscopic observation. Once the retriever was entirely removed from the system, free aspiration was confirmed at the hub of the aspiration catheter, with free blood return confirmed. The balloon was then deflated, rotating hemostatic valve was reattached, and a control angiogram was performed, confirming restoration of flow. Penumbra catheter was then withdrawn, and the angiogram was performed at the carotid bifurcation. Given the  appearance, we elected to place acute carotid stent. The lesion was then again navigated with synchro soft wire and rapid transit. With the microwire microcatheter positioned in the distal cervical carotid, the microwire was removed. The exchange length Transcend wire was then replaced and the microcatheter was removed. Acute carotid stent was then deployed, 8 mm-6 mm x 40 mm length. Before deployment, we initiated Integrilin drip treatment for dual anti-platelet therapy. Angiogram was performed. Coaxial system of the penumbra aspiration catheter and the pro view 18 catheter were then navigated to the carotid siphon for better angiogram of the cerebral vasculature. Angiogram was performed. Microcatheter, a number aspiration catheter were withdrawn. The balloon guide catheter was positioned in the common carotid artery for final angiogram. All catheter and wires were removed. The sheath at the right common femoral artery access was withdrawn for an angiogram. Eight French Angio-Seal was then deployed for hemostasis. A flat panel CT was then performed.  Patient was then extubated. Estimated blood loss was 50 cc No complications encountered. IMPRESSION: Status post left-sided cerebral angiogram with treatment of left ICA/MCA tandem occlusion, requiring acute left carotid stenting and mechanical thrombectomy of a left M1 occlusion with achievement of mTICI-3 flow, and restoration of cervical ICA flow. Flat panel CT in the room demonstrates small contrast stain without complicating features, and the patient was extubated. Status post deployment of 8 French Angio-Seal for right common femoral artery hemostasis. Status post ultrasound-guided left common femoral artery access with a 4 French sheath for arterial monitoring, which remained after the case. Status post ultrasound-guided right common femoral vein access for additional IV access, which remained after the case transmitting the Integrilin drip. Signed, Dulcy Fanny.  Earleen Newport, DO [facility] PLAN: Patient will go to the PACU ICU admission Bilateral hips will be straight until the left 4 French arterial monitoring sheath is removed and the right common femoral vein venous access sheath is removed. Routine wound management for the right common femoral artery sheath access. Frequent neurovascular checks. IV saline hydration for 8 hours given the renal insufficiency Blood pressure control management of 120-140 with nicardipine drip ordered The goal will be dual anti-platelet therapy, for now with dose of 650 mg aspirin and the Integrilin drip. Electronically Signed   By: Corrie Mckusick D.O.   On: 05/06/2017 09:24   Ct Head Code Stroke Wo Contrast  Result Date: 05/06/2017 CLINICAL DATA:  Code stroke. 77 y/o M; right-sided weakness and numbness with slurred speech. EXAM: CT HEAD WITHOUT CONTRAST TECHNIQUE: Contiguous axial images were obtained from the base of the skull through the vertex without intravenous contrast. COMPARISON:  11/19/2016 MRI of the head. FINDINGS: Brain: No evidence of acute infarction, hemorrhage, hydrocephalus, extra-axial collection or mass lesion/mass effect. Stable moderate chronic microvascular ischemic changes and parenchymal volume loss of the brain. Vascular: Calcific atherosclerosis of  carotid siphons. Dense left M1 (series 5, image 30). Skull: Normal. Negative for fracture or focal lesion. Sinuses/Orbits: No acute finding. Other: None. ASPECTS [facility] Stroke Program Early CT Score) - Ganglionic level infarction (caudate, lentiform nuclei, internal capsule, insula, M1-M3 cortex): 7 - Supraganglionic infarction (M4-M6 cortex): 3 Total score (0-10 with 10 being normal): 10 IMPRESSION: 1. No acute stroke, hemorrhage, or mass effect identified. 2. Dense left M1, possible thrombus. 3. ASPECTS is 10 4. Stable chronic microvascular ischemic changes and parenchymal volume loss of the brain given  differences in technique. These results were communicated to Dr. Cheral Marker at 3:41 amon 2/28/2019by text page via the [facility] messaging system. Electronically Signed   By: Kristine Garbe M.D.   On: 05/06/2017 03:43    Labs:  CBC: Recent Labs    03/18/17 1512 03/25/17 0952 05/06/17 0312 05/06/17 0329 05/07/17 0500  WBC 5.7 4.6 8.0  --  8.3  HGB 13.0 11.7* 12.6* 13.3 10.7*  HCT 40.0 35.3* 38.4* 39.0 33.4*  PLT 255 194 185  --  176    COAGS: Recent Labs    10/30/16 1145 05/06/17 0312  INR 0.92 0.92  APTT 32 26    BMP: Recent Labs    03/18/17 1512 03/25/17 0952 05/06/17 0312 05/06/17 0329 05/07/17 0500  NA 137 135 135 137 139  K 4.9 5.3* 4.3 4.3 4.3  CL 106 106 101 103 109  CO2 23 22 22   --  22  GLUCOSE 123* 226* 383* 366* 203*  BUN 27* 26* 40* 37* 30*  CALCIUM 9.4 8.8* 9.1  --  8.2*  CREATININE 1.98* 1.94* 1.98* 1.80* 1.88*  GFRNONAA 31* 32* 31*  --  33*  GFRAA 36* 37* 36*  --  38*    LIVER FUNCTION TESTS: Recent Labs    02/10/17 0847 03/18/17 1512 03/25/17 0952 05/06/17 0312  BILITOT 0.3 0.5 0.5 0.4  AST 18 20 21 16   ALT 13* 10* 10* 13*  ALKPHOS 88 87 83 100  PROT 7.6 8.1 7.2 6.6  ALBUMIN 3.6 4.1 3.7 3.1*    Assessment and Plan: 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  Conversant when prompted.  Still with right-sided deficits.  To start Plavix today after Integrilin has been discontinued.  Plan per Neurology.   Electronically Signed: Docia Barrier, PA 05/07/2017, 11:43 AM   I spent a total of 15 Minutes at the the patient's bedside AND on the patient's hospital floor or unit, greater than 50% of which was counseling/coordinating care for CVA.

## 2017-05-07 NOTE — Telephone Encounter (Signed)
Spoke to pt's wife re; admitted for stroke. Also spoke to Dr.Xu- would recommend continued cardiac work up for stroke.  Recommend follow up in 1 month/labs-cbc/cmp/ldh/port flush. Cancel appts for march 6th/ please inform pt's wife. Thx

## 2017-05-07 NOTE — Progress Notes (Signed)
  Echocardiogram 2D Echocardiogram has been performed.  Labella Zahradnik G Gayl Ivanoff 05/07/2017, 10:13 AM

## 2017-05-07 NOTE — Progress Notes (Signed)
Removed pt's foley catheter per order, small amount of frank blood seen when catheter removed. Will pass along to PM RN to continue to monitor.

## 2017-05-08 ENCOUNTER — Inpatient Hospital Stay (HOSPITAL_COMMUNITY): Payer: Medicare Other

## 2017-05-08 DIAGNOSIS — N289 Disorder of kidney and ureter, unspecified: Secondary | ICD-10-CM

## 2017-05-08 DIAGNOSIS — I959 Hypotension, unspecified: Secondary | ICD-10-CM

## 2017-05-08 DIAGNOSIS — R9389 Abnormal findings on diagnostic imaging of other specified body structures: Secondary | ICD-10-CM

## 2017-05-08 LAB — CORTISOL: Cortisol, Plasma: 15.6 ug/dL

## 2017-05-08 LAB — HEMOGLOBIN AND HEMATOCRIT, BLOOD
HEMATOCRIT: 36.5 % — AB (ref 39.0–52.0)
HEMOGLOBIN: 11.8 g/dL — AB (ref 13.0–17.0)

## 2017-05-08 LAB — CBC
HEMATOCRIT: 34.9 % — AB (ref 39.0–52.0)
HEMOGLOBIN: 11.2 g/dL — AB (ref 13.0–17.0)
MCH: 30.8 pg (ref 26.0–34.0)
MCHC: 32.1 g/dL (ref 30.0–36.0)
MCV: 95.9 fL (ref 78.0–100.0)
Platelets: 164 10*3/uL (ref 150–400)
RBC: 3.64 MIL/uL — ABNORMAL LOW (ref 4.22–5.81)
RDW: 13.2 % (ref 11.5–15.5)
WBC: 6.9 10*3/uL (ref 4.0–10.5)

## 2017-05-08 LAB — URINALYSIS, ROUTINE W REFLEX MICROSCOPIC
Bilirubin Urine: NEGATIVE
Glucose, UA: NEGATIVE mg/dL
KETONES UR: NEGATIVE mg/dL
Leukocytes, UA: NEGATIVE
Nitrite: NEGATIVE
PH: 5 (ref 5.0–8.0)
Protein, ur: 30 mg/dL — AB
SPECIFIC GRAVITY, URINE: 1.017 (ref 1.005–1.030)

## 2017-05-08 LAB — BASIC METABOLIC PANEL
Anion gap: 9 (ref 5–15)
BUN: 33 mg/dL — AB (ref 6–20)
CHLORIDE: 110 mmol/L (ref 101–111)
CO2: 20 mmol/L — AB (ref 22–32)
Calcium: 8.4 mg/dL — ABNORMAL LOW (ref 8.9–10.3)
Creatinine, Ser: 1.89 mg/dL — ABNORMAL HIGH (ref 0.61–1.24)
GFR, EST AFRICAN AMERICAN: 38 mL/min — AB (ref >60)
GFR, EST NON AFRICAN AMERICAN: 33 mL/min — AB (ref >60)
Glucose, Bld: 173 mg/dL — ABNORMAL HIGH (ref 65–99)
Potassium: 4.4 mmol/L (ref 3.5–5.1)
Sodium: 139 mmol/L (ref 135–145)

## 2017-05-08 LAB — LACTIC ACID, PLASMA
LACTIC ACID, VENOUS: 1.6 mmol/L (ref 0.5–1.9)
LACTIC ACID, VENOUS: 1.9 mmol/L (ref 0.5–1.9)
Lactic Acid, Venous: 2.5 mmol/L (ref 0.5–1.9)

## 2017-05-08 LAB — GLUCOSE, CAPILLARY
GLUCOSE-CAPILLARY: 153 mg/dL — AB (ref 65–99)
Glucose-Capillary: 168 mg/dL — ABNORMAL HIGH (ref 65–99)
Glucose-Capillary: 185 mg/dL — ABNORMAL HIGH (ref 65–99)
Glucose-Capillary: 154 mg/dL — ABNORMAL HIGH (ref 65–99)

## 2017-05-08 MED ORDER — CHLORHEXIDINE GLUCONATE CLOTH 2 % EX PADS
6.0000 | MEDICATED_PAD | Freq: Every day | CUTANEOUS | Status: DC
  Administered 2017-05-08 – 2017-05-11 (×3): 6 via TOPICAL

## 2017-05-08 MED ORDER — SODIUM CHLORIDE 0.9 % IV BOLUS (SEPSIS): 1000.0000 mL | Freq: Once | INTRAVENOUS | Status: DC

## 2017-05-08 MED ORDER — SODIUM CHLORIDE 0.9 % IV SOLN
INTRAVENOUS | Status: DC
  Administered 2017-05-08 – 2017-05-11 (×6): via INTRAVENOUS

## 2017-05-08 MED ORDER — SODIUM CHLORIDE 0.9% FLUSH: 10.0000 mL | INTRAVENOUS | Status: DC | PRN

## 2017-05-08 MED ORDER — SODIUM CHLORIDE 0.9 % IV BOLUS (SEPSIS)
500.0000 mL | Freq: Once | INTRAVENOUS | Status: AC
  Administered 2017-05-08: 500 mL via INTRAVENOUS

## 2017-05-08 MED ORDER — FLUDROCORTISONE ACETATE 0.1 MG PO TABS
0.1000 mg | ORAL_TABLET | Freq: Every day | ORAL | Status: DC
  Administered 2017-05-08 – 2017-05-11 (×4): 0.1 mg via ORAL
  Filled 2017-05-08 (×4): qty 1

## 2017-05-08 NOTE — Consult Note (Signed)
[facility] History and Physical  [name] PHX:[id number] DOB: [birth year] DOA: 05/06/2017  Referring physician: Dr. Erlinda Hong  (called by PA Doy Mince) PCP: Marguerita Merles, MD   Chief Complaint: low BP   HPI: Edwin Jones is a 77 y.o. male with pmhx significant for COPD, CA, DM, GERD, Hep C, HLD, HTN and seasonal allergies. Pt had low BP after neo drip stopped. Pt had received midodrine, NS boluses and albumin. With no effect. Hospitalists consulted. Pt denies any bleeding and this was confirmed by nursing. PO intake has been >75% of meals. No N/V/D. Pt denies any dizziness, sob, cp.   Review of Systems:  As per HPI otherwise 10 point review of systems negative.    Past Medical History:  Diagnosis Date  . Cancer ([place])   . COPD (chronic obstructive pulmonary disease) ([place])   . Diabetes mellitus without complication ([place])   . GERD without esophagitis   . Hepatitis C virus    history of hep C treated with Harvoni  . Hypercholesterolemia   . Hypertension   . Seasonal allergies    Past Surgical History:  Procedure Laterality Date  . COLONOSCOPY     approximately 11 years ago  . FLEXIBLE BRONCHOSCOPY N/A 11/05/2016   Procedure: FLEXIBLE BRONCHOSCOPY;  Surgeon: Wilhelmina Mcardle, MD;  Location: [facility] ORS;  Service: Pulmonary;  Laterality: N/A;  . IR FLUORO GUIDE CV LINE RIGHT  05/06/2017  . IR INTRAVSC STENT CERV CAROTID W/O EMB-PROT MOD SED INC ANGIO  05/06/2017  . IR PERCUTANEOUS ART THROMBECTOMY/INFUSION INTRACRANIAL INC DIAG ANGIO  05/06/2017  . IR US GUIDE VASC ACCESS LEFT  05/06/2017  . IR US GUIDE VASC ACCESS RIGHT  05/06/2017  . IR US GUIDE VASC ACCESS RIGHT  05/06/2017  . PORTA CATH INSERTION N/A 11/23/2016   Procedure: Glori Luis Cath Insertion;  Surgeon: Algernon Huxley, MD;  Location: [facility];  Service: Cardiovascular;  Laterality: N/A;  . RADIOLOGY WITH ANESTHESIA N/A 05/06/2017   Procedure: IR WITH ANESTHESIA;  Surgeon: Radiologist, Medication, MD;  Location: [facility];   Service: Radiology;  Laterality: N/A;  . TONSILLECTOMY     Social History:  reports that he quit smoking about 13 years ago. His smoking use included cigarettes. He has a 50.00 pack-year smoking history. he has never used smokeless tobacco. He reports that he does not drink alcohol or use drugs.  Allergies  Allergen Reactions  . Penicillin G Anaphylaxis    Other reaction(s): Other (See Comments) Couldn't breath among other things Has patient had a PCN reaction causing immediate rash, facial/tongue/throat swelling, SOB or lightheadedness with hypotension: Yes Has patient had a PCN reaction causing severe rash involving mucus membranes or skin necrosis: No Has patient had a PCN reaction that required hospitalization: Yes Has patient had a PCN reaction occurring within the last 10 years: No If all of the above answers are "NO", then may proceed with Ce  . Shellfish-Derived Products Anaphylaxis  . Statins     Other reaction(s): Other (See Comments) Muscle spasms - can't walk - couldn't turn over in bed  . Erythromycin Itching    All mycins  . Tamsulosin     Other reaction(s): Dizziness  . Tuberculin Ppd     Other reaction(s): Other (See Comments) False test   . Iodinated Diagnostic Agents Hives    Family History  Problem Relation Age of Onset  . Skin cancer Mother   . Ovarian cancer Sister        sister was  diagnosed as a teenager  . Throat cancer Brother 58       brother #1  . Lung cancer Sister 34  . Throat cancer Brother 63       brother #2  . Skin cancer Sister      Prior to Admission medications   Medication Sig Start Date End Date Taking? Authorizing Provider  albuterol (PROVENTIL HFA;VENTOLIN HFA) 108 (90 Base) MCG/ACT inhaler Inhale 2 puffs into the lungs every 6 (six) hours as needed for wheezing or shortness of breath. 03/25/17  Yes Cammie Sickle, MD  APPLE CIDER VINEGAR PO Take 450 mg by mouth at bedtime.    Yes [provider]  aspirin EC 81 MG  tablet Take 81 mg by mouth at bedtime.    Yes [provider]  cetirizine (ZYRTEC) 10 MG tablet Take 10 mg by mouth daily.   Yes [provider]  cholecalciferol (VITAMIN D) 1000 units tablet Take 1,000 Units by mouth daily.   Yes [provider]  Coenzyme Q10 100 MG capsule Take 100 mg by mouth daily.    Yes [provider]  Flaxseed, Linseed, (FLAXSEED OIL) 1000 MG CAPS Take 1,000 mg by mouth at bedtime.    Yes [provider]  fluticasone (FLONASE) 50 MCG/ACT nasal spray Place 1 spray into both nostrils daily as needed for allergies or rhinitis.   Yes [provider]  Fluticasone-Salmeterol (ADVAIR DISKUS) 500-50 MCG/DOSE AEPB Inhale 1 puff into the lungs 2 (two) times daily. 03/25/17  Yes Cammie Sickle, MD  glipiZIDE (GLUCOTROL) 10 MG tablet Take 10 mg by mouth daily. Time release capsule   Yes [provider]  glucagon (GLUCAGON EMERGENCY) 1 MG injection Inject 1 mg into the muscle once as needed. When blood glucose drops less than 50; and if patient has difficulty drinking. 12/16/16  Yes Cammie Sickle, MD  GNP GARLIC EXTRACT PO Take 1,610 mg by mouth daily.    Yes [provider]  hydrochlorothiazide (HYDRODIURIL) 25 MG tablet Take 25 mg by mouth daily.   Yes [provider]  linagliptin (TRADJENTA) 5 MG TABS tablet Take 5 mg by mouth daily.   Yes [provider]  lisinopril (PRINIVIL,ZESTRIL) 10 MG tablet Take 10 mg by mouth daily.   Yes [provider]  methocarbamol (ROBAXIN) 500 MG tablet Take 1 tablet (500 mg total) by mouth every 8 (eight) hours as needed for muscle spasms. 03/19/17  Yes Verlon Au, NP  Multiple Vitamin (MULTIVITAMIN WITH MINERALS) TABS tablet Take 1 tablet by mouth daily.   Yes [provider]  omeprazole (PRILOSEC) 20 MG capsule Take 20 mg by mouth every Monday, Wednesday, and Friday.  07/04/14  Yes [provider]  simethicone (MYLICON)  80 MG chewable tablet Chew 80 mg by mouth as needed for flatulence.    Yes [provider]  vitamin C (ASCORBIC ACID) 250 MG tablet Take 250 mg by mouth daily.   Yes [provider]  zinc sulfate 220 (50 Zn) MG capsule Take 220 mg by mouth daily.   Yes [provider]  dexamethasone (DECADRON) 4 MG tablet Take 1 tablet (4 mg total) by mouth daily. Starting on the first day of radiation Patient not taking: Reported on 05/06/2017 04/02/17   Noreene Filbert, MD  lidocaine-prilocaine (EMLA) cream Apply 1 application topically as needed. Patient not taking: Reported on 05/06/2017 11/24/16   Cammie Sickle, MD  ondansetron (ZOFRAN) 8 MG tablet Take 1 tablet (8  mg total) by mouth every 8 (eight) hours as needed for nausea or vomiting (start 3 days; after chemo). Patient not taking: Reported on 03/25/2017 11/22/16   Cammie Sickle, MD  prochlorperazine (COMPAZINE) 10 MG tablet Take 1 tablet (10 mg total) by mouth every 6 (six) hours as needed for nausea or vomiting. Patient not taking: Reported on 03/25/2017 11/22/16   Cammie Sickle, MD  ZINC-VITAMIN C MT Take 1 tablet daily by mouth.    [provider]   Physical Exam: Vitals:   05/08/17 1100 05/08/17 1130 05/08/17 1200 05/08/17 1230  BP: (!) 121/58 (!) 111/58 (!) 103/56 (!) 105/53  Pulse: 62 60 (!) 58 67  Resp: _0 Temp:   98.5 F (36.9 C)   TempSrc:   Oral   SpO2: 100% 95% 94% 97%  Weight:      Height:        Wt Readings from Last 3 Encounters:  05/06/17 96.2 kg (212 lb 1.3 oz)  03/25/17 97.2 kg (214 lb 3.2 oz)  02/17/17 95.2 kg (209 lb 14.1 oz)    General:  Appears calm and comfortable; Port on R chest; A&Ox3 Eyes:  PERRL, EOMI, normal lids, iris ENT:  grossly normal hearing, lips & tongue Neck:  no LAD, masses or thyromegaly Cardiovascular:  RRR, no m/r/g. No LE edema.  Respiratory:  CTA bilaterally, no w/r/r. Normal respiratory effort. Abdomen:  soft, ntnd Skin:  no rash  or induration seen on limited exam Musculoskeletal:  grossly normal tone BUE/BLE Psychiatric:  grossly normal mood and affect, speech fluent and appropriate Neurologic:  CN 2-12 grossly intact, moves all extremities in coordinated fashion.          Labs on Admission:  Basic Metabolic Panel: Recent Labs  Lab 05/06/17 0312 05/06/17 0329 05/07/17 0500 05/08/17 0615  NA 135 137 139 139  K 4.3 4.3 4.3 4.4  CL 101 103 109 110  CO2 22  --  22 20*  GLUCOSE 383* 366* 203* 173*  BUN 40* 37* 30* 33*  CREATININE 1.98* 1.80* 1.88* 1.89*  CALCIUM 9.1  --  8.2* 8.4*   Liver Function Tests: Recent Labs  Lab 05/06/17 0312  AST 16  ALT 13*  ALKPHOS 100  BILITOT 0.4  PROT 6.6  ALBUMIN 3.1*   No results for input(s): LIPASE, AMYLASE in the last 168 hours. No results for input(s): AMMONIA in the last 168 hours. CBC: Recent Labs  Lab 05/06/17 0312 05/06/17 0329 05/07/17 0500 05/08/17 0615  WBC 8.0  --  8.3 6.9  NEUTROABS 6.3  --   --   --   HGB 12.6* 13.3 10.7* 11.2*  HCT 38.4* 39.0 33.4* 34.9*  MCV 95.5  --  96.3 95.9  PLT 185  --  176 164   Cardiac Enzymes: No results for input(s): CKTOTAL, CKMB, CKMBINDEX, TROPONINI in the last 168 hours.  BNP (last 3 results) No results for input(s): BNP in the last 8760 hours.  ProBNP (last 3 results) No results for input(s): PROBNP in the last 8760 hours.   Serum creatinine: 1.89 mg/dL (H) 05/08/17 0615 Estimated creatinine clearance: 39.1 mL/min (A)  CBG: Recent Labs  Lab 05/07/17 1151 05/07/17 1701 05/07/17 2117 05/08/17 0751 05/08/17 1158  GLUCAP 176* 151* 118* 168* 185*    Radiological Exams on Admission: Dg Chest Port 1 View  Result Date: 05/06/2017 CLINICAL DATA:  Shortness of breath. Patient status post left MCA thrombectomy and left ICA stent placement today. EXAM:  PORTABLE CHEST 1 VIEW COMPARISON:  PA and lateral chest 03/18/2017.  CT chest 03/24/2017. FINDINGS: Interstitial prominence about the patient's left  upper lobe pulmonary mass is worse than on the prior plain films of the chest. Mild basilar atelectasis is noted. Heart size is normal. Port-A-Cath is in place. Aortic atherosclerosis is seen. IMPRESSION: Increased interstitial opacities about the patient's left upper lobe pulmonary mass are nonspecific and could be due to tumor spread, postobstructive pneumonitis or less likely hemorrhage. Atherosclerosis. Electronically Signed   By: Inge Rise M.D.   On: 05/06/2017 15:41    EKG: pending  Assessment/Plan Active Problems:   Stroke [facility])   Stroke (cerebrum) (HCC)   Acute ischemic left MCA stroke (HCC)  Low BP, improved with IVF Checking cortisol Checking EKG for ST changes Checking Hgb, had trended down minimally-->stable Checking LA --> elevated, ordered more IVF as pt also appeared dry on exam, if LA does not trend well consider stopping oral DM meds Cont midodrine 28m tid and florinef qd  Check UA CXR shows known abn, consider CT chest if needed ACTH stim test ordered   Stroke L MCA Mgmt per stroke team S/p ICA stenting and mechanical thrombectomy   [place] Lung CA F/u with on on d/c  COPD Cont prn albuterol  DM Recommend holding oral DM medications Cont SSI w meals  CKD Monitor Cr daily Cr at baseline,  1.8-1.9 1.89 today  Family Communication: wife at bedside Status: ICU, inpt  PElwin Mocha MD Family Medicine [facility] [email or web address] Password [email or web address]

## 2017-05-08 NOTE — Progress Notes (Signed)
Referring Physician(s): Sethi,P  Supervising Physician: Corrie Mckusick  Patient Status:  [facility] - In-pt  Chief Complaint: stroke   Subjective: Pt resting quietly in bed; wife at bedside; following commands but speaking very little; still with rt facial droop, sl rt sided weakness; occ coughing   Allergies: Penicillin g; Shellfish-derived products; Statins; Erythromycin; Tamsulosin; Tuberculin ppd; and Iodinated diagnostic agents  Medications: Prior to Admission medications   Medication Sig Start Date End Date Taking? Authorizing Provider  albuterol (PROVENTIL HFA;VENTOLIN HFA) 108 (90 Base) MCG/ACT inhaler Inhale 2 puffs into the lungs every 6 (six) hours as needed for wheezing or shortness of breath. 03/25/17  Yes Cammie Sickle, MD  APPLE CIDER VINEGAR PO Take 450 mg by mouth at bedtime.    Yes [provider]  aspirin EC 81 MG tablet Take 81 mg by mouth at bedtime.    Yes [provider]  cetirizine (ZYRTEC) 10 MG tablet Take 10 mg by mouth daily.   Yes [provider]  cholecalciferol (VITAMIN D) 1000 units tablet Take 1,000 Units by mouth daily.   Yes [provider]  Coenzyme Q10 100 MG capsule Take 100 mg by mouth daily.    Yes [provider]  Flaxseed, Linseed, (FLAXSEED OIL) 1000 MG CAPS Take 1,000 mg by mouth at bedtime.    Yes [provider]  fluticasone (FLONASE) 50 MCG/ACT nasal spray Place 1 spray into both nostrils daily as needed for allergies or rhinitis.   Yes [provider]  Fluticasone-Salmeterol (ADVAIR DISKUS) 500-50 MCG/DOSE AEPB Inhale 1 puff into the lungs 2 (two) times daily. 03/25/17  Yes Cammie Sickle, MD  glipiZIDE (GLUCOTROL) 10 MG tablet Take 10 mg by mouth daily. Time release capsule   Yes [provider]  glucagon (GLUCAGON EMERGENCY) 1 MG injection Inject 1 mg into the muscle once as needed. When blood glucose drops less than 50; and if patient has difficulty  drinking. 12/16/16  Yes Cammie Sickle, MD  GNP GARLIC EXTRACT PO Take 7,628 mg by mouth daily.    Yes [provider]  hydrochlorothiazide (HYDRODIURIL) 25 MG tablet Take 25 mg by mouth daily.   Yes [provider]  linagliptin (TRADJENTA) 5 MG TABS tablet Take 5 mg by mouth daily.   Yes [provider]  lisinopril (PRINIVIL,ZESTRIL) 10 MG tablet Take 10 mg by mouth daily.   Yes [provider]  methocarbamol (ROBAXIN) 500 MG tablet Take 1 tablet (500 mg total) by mouth every 8 (eight) hours as needed for muscle spasms. 03/19/17  Yes Verlon Au, NP  Multiple Vitamin (MULTIVITAMIN WITH MINERALS) TABS tablet Take 1 tablet by mouth daily.   Yes [provider]  omeprazole (PRILOSEC) 20 MG capsule Take 20 mg by mouth every Monday, Wednesday, and Friday.  07/04/14  Yes [provider]  simethicone (MYLICON) 80 MG chewable tablet Chew 80 mg by mouth as needed for flatulence.    Yes [provider]  vitamin C (ASCORBIC ACID) 250 MG tablet Take 250 mg by mouth daily.   Yes [provider]  zinc sulfate 220 (50 Zn) MG capsule Take 220 mg by mouth daily.   Yes [provider]  dexamethasone (DECADRON) 4 MG tablet Take 1 tablet (4 mg total) by mouth daily. Starting on the first day of radiation Patient not taking: Reported on 05/06/2017 04/02/17   Noreene Filbert, MD  lidocaine-prilocaine (EMLA) cream Apply 1 application topically as needed. Patient not taking: Reported on  05/06/2017 11/24/16   Cammie Sickle, MD  ondansetron (ZOFRAN) 8 MG tablet Take 1 tablet (8 mg total) by mouth every 8 (eight) hours as needed for nausea or vomiting (start 3 days; after chemo). Patient not taking: Reported on 03/25/2017 11/22/16   Cammie Sickle, MD  prochlorperazine (COMPAZINE) 10 MG tablet Take 1 tablet (10 mg total) by mouth every 6 (six) hours as needed for nausea or vomiting. Patient not taking: Reported on 03/25/2017  11/22/16   Cammie Sickle, MD  ZINC-VITAMIN C MT Take 1 tablet daily by mouth.    [provider]     Vital Signs: BP (!) 114/58   Pulse 74   Temp 98.5 F (36.9 C) (Oral)   Resp (!) 22   Ht 6\' 3"  (1.905 m)   Wt 212 lb 1.3 oz (96.2 kg)   SpO2 92%   BMI 26.51 kg/m   Physical Exam arousable but still drowsy; FC; mild rt facial droop; pupils 3 mm equal; strength 4/5 RUE, 5/5 LUE, 4/5 RLE, 5/5 LLE; groin sites ok, no hematoma  Imaging: Ct Angio Head W Or Wo Contrast  Result Date: 05/06/2017 CLINICAL DATA:  77 y/o M; right-sided weakness, numbness, slurred speech. EXAM: CT ANGIOGRAPHY HEAD AND NECK TECHNIQUE: Multidetector CT imaging of the head and neck was performed using the standard protocol during bolus administration of intravenous contrast. Multiplanar CT image reconstructions and MIPs were obtained to evaluate the vascular anatomy. Carotid stenosis measurements (when applicable) are obtained utilizing NASCET criteria, using the distal internal carotid diameter as the denominator. CONTRAST:  13mL ISOVUE-370 IOPAMIDOL (ISOVUE-370) INJECTION 76% COMPARISON:  None. FINDINGS: CTA NECK FINDINGS Aortic arch: Standard branching. Imaged portion shows no evidence of aneurysm or dissection. No significant stenosis of the major arch vessel origins. Right carotid system: No evidence of dissection, stenosis (50% or greater) or occlusion. Left carotid system: Patent left common carotid artery. Left ICA occlusion to the terminus where there is a short segment of reconstitution of the terminal ICA and proximal left M1 via the anterior communicating artery. Vertebral arteries: Codominant. No evidence of dissection, stenosis (50% or greater) or occlusion. Skeleton: Moderate cervical spine spondylosis with multilevel discogenic degenerative changes greatest at the C4 through C7 levels or uncovertebral and facet hypertrophy encroach on neural foramen and there is mild canal stenosis. Other neck:  Negative. Upper chest: Right port catheter in the SVC extending below field of view. Review of the MIP images confirms the above findings CTA HEAD FINDINGS Anterior circulation: Left mid M1 occlusion with poor left MCA distribution collateral circulation. Right distal M1 mild stenosis. No additional large vessel occlusion, aneurysm, or vascular malformation in the anterior circulation. Posterior circulation: No significant stenosis, proximal occlusion, aneurysm, or vascular malformation. Right P2 moderate stenosis and left P2 mild stenosis. Venous sinuses: As permitted by contrast timing, patent. Anatomic variants: Patent anterior and bilateral posterior communicating arteries. Review of the MIP images confirms the above findings IMPRESSION: 1. 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 Short segment of patency of left ICA terminus and proximal left M1 via anterior communicating artery. 2. Left mid M1 occlusion with poor left MCA collateralization. 3. Patent right carotid and bilateral vertebral systems. 4. Patent bilateral ACA, right MCA, and bilateral PCA distributions. Intracranial atherosclerosis with multiple segments of mild-to-moderate stenosis. These results were called by telephone at the time of interpretation on 05/06/2017 at 4:10 am to Dr. Cheral Marker, who verbally acknowledged these results. Electronically Signed   By: Edgardo Roys.D.  On: 05/06/2017 04:17   Ct Angio Neck W And/or Wo Contrast  Result Date: 05/06/2017 CLINICAL DATA:  77 y/o M; right-sided weakness, numbness, slurred speech. EXAM: CT ANGIOGRAPHY HEAD AND NECK TECHNIQUE: Multidetector CT imaging of the head and neck was performed using the standard protocol during bolus administration of intravenous contrast. Multiplanar CT image reconstructions and MIPs were obtained to evaluate the vascular anatomy. Carotid stenosis measurements (when applicable) are obtained utilizing NASCET criteria, using the distal internal carotid  diameter as the denominator. CONTRAST:  53mL ISOVUE-370 IOPAMIDOL (ISOVUE-370) INJECTION 76% COMPARISON:  None. FINDINGS: CTA NECK FINDINGS Aortic arch: Standard branching. Imaged portion shows no evidence of aneurysm or dissection. No significant stenosis of the major arch vessel origins. Right carotid system: No evidence of dissection, stenosis (50% or greater) or occlusion. Left carotid system: Patent left common carotid artery. Left ICA occlusion to the terminus where there is a short segment of reconstitution of the terminal ICA and proximal left M1 via the anterior communicating artery. Vertebral arteries: Codominant. No evidence of dissection, stenosis (50% or greater) or occlusion. Skeleton: Moderate cervical spine spondylosis with multilevel discogenic degenerative changes greatest at the C4 through C7 levels or uncovertebral and facet hypertrophy encroach on neural foramen and there is mild canal stenosis. Other neck: Negative. Upper chest: Right port catheter in the SVC extending below field of view. Review of the MIP images confirms the above findings CTA HEAD FINDINGS Anterior circulation: Left mid M1 occlusion with poor left MCA distribution collateral circulation. Right distal M1 mild stenosis. No additional large vessel occlusion, aneurysm, or vascular malformation in the anterior circulation. Posterior circulation: No significant stenosis, proximal occlusion, aneurysm, or vascular malformation. Right P2 moderate stenosis and left P2 mild stenosis. Venous sinuses: As permitted by contrast timing, patent. Anatomic variants: Patent anterior and bilateral posterior communicating arteries. Review of the MIP images confirms the above findings IMPRESSION: 1. 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 Short segment of patency of left ICA terminus and proximal left M1 via anterior communicating artery. 2. Left mid M1 occlusion with poor left MCA collateralization. 3. Patent right carotid and bilateral  vertebral systems. 4. Patent bilateral ACA, right MCA, and bilateral PCA distributions. Intracranial atherosclerosis with multiple segments of mild-to-moderate stenosis. These results were called by telephone at the time of interpretation on 05/06/2017 at 4:10 am to Dr. Cheral Marker, who verbally acknowledged these results. Electronically Signed   By: Kristine Garbe M.D.   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 Personal history of prior whole brain radiation for cancer, but no known brain metastases. EXAM: MRI HEAD WITHOUT CONTRAST MRA HEAD WITHOUT CONTRAST TECHNIQUE: Multiplanar, multiecho pulse sequences of the brain and surrounding structures were obtained without intravenous contrast. Angiographic images of the head were obtained using MRA technique without contrast. COMPARISON:  Cerebral angiogram, CTA head and neck, and noncontrast head CT earlier today. Prior brain MRI 11/19/2016. FINDINGS: MRI HEAD FINDINGS Brain: Restricted diffusion in the left basal ganglia (series 3, image 32) with T2 and FLAIR hyperintensity. No associated hemorrhage. No significant mass effect. There is a small 9 millimeter area of restricted diffusion in the medial left temporal lobe at the amygdala (series 3, image 20). There are otherwise a small number of tiny scattered foci of restricted diffusion in the left MCA territory (such as series 3, image 40). There  is no right hemisphere or posterior circulation restricted diffusion. Patchy and scattered bilateral cerebral white matter T2 and FLAIR hyperintensity elsewhere is chronic and stable. The right deep gray matter nuclei, left thalamus, brainstem, and cerebellum remain normal for age. No midline shift, mass effect, evidence of mass lesion, ventriculomegaly, extra-axial collection or acute  intracranial hemorrhage. Cervicomedullary junction and pituitary are within normal limits. Vascular: Major intracranial vascular flow voids are preserved, the left ICA flow void was abnormal in 2018 and appears mildly improved today. Negative visible cervical spine.  Normal bone marrow signal. There is mild leftward gaze deviation. Orbits soft tissues otherwise appear normal. Paranasal Visualized paranasal sinuses and mastoids are stable and well pneumatized. Scalp and face soft tissues appear negative. MRA HEAD FINDINGS Antegrade flow in the posterior circulation with codominant distal vertebral arteries. Mild distal vertebral irregularity with no stenosis. Patent vertebrobasilar junction without stenosis. Dominant appearing AICA origins are patent. Mild basilar artery irregularity without stenosis. SCA origins are patent. Bilateral PCA origins appear normal. Posterior communicating arteries are diminutive or absent. Bilateral PCA branches are mildly irregular, and there is mild right PCA P2 segment stenosis which is stable from earlier today. Antegrade flow in both ICA siphons. Mild luminal narrowing and irregularity throughout the visible left ICA may reflect residual thrombus or plaque. The left ICA is patent to the left terminus. The left MCA and ACA origins are patent. There is mild to moderate irregularity at the left ACA origin and proximal A1 segment which is stable from earlier today. There is mild irregularity scratched at the left MCA and its branches now are patent. There is mild left M1 irregularity. The left MCA bifurcation is patent with no branch occlusion identified. Antegrade flow in the right ICA siphon which is only mildly irregular and without stenosis. Normal right ophthalmic artery origin. Normal right ICA terminus, right ACA and MCA origins. The anterior communicating artery is normal. The visible ACA branches are stable, with mild irregularity of the distal left ACA. The right MCA M1  segment is patent with mild irregularity just proximal to the bifurcation which is stable. The right MCA branches are stable and within normal limits. IMPRESSION: 1. Restored patency of the Left ICA, terminus, and Left MCA since earlier today. Mild residual irregularity throughout the left ICA siphon, and in the left M1 segment. Stable mild to moderate irregularity in the left A1 and distal A2 segments. 2. Associated core infarct primarily confined to the Left Basal Ganglia. There is a small 9 mm infarct in the left amygdala, and there are small number of scattered tiny cortical infarcts elsewhere in the Left MCA territory. 3. Mild cytotoxic edema with no associated hemorrhage or mass effect. 4. 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 Electronically Signed   By: Genevie Ann M.D.   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 Personal history of prior whole brain radiation for cancer, but no known brain metastases. EXAM: MRI HEAD WITHOUT CONTRAST MRA HEAD WITHOUT CONTRAST TECHNIQUE: Multiplanar, multiecho pulse sequences of the brain and surrounding structures were obtained without intravenous contrast. Angiographic images of the head were obtained using MRA technique without contrast. COMPARISON:  Cerebral angiogram, CTA head and neck, and noncontrast head CT earlier today. Prior brain MRI 11/19/2016. FINDINGS: MRI HEAD FINDINGS Brain: Restricted diffusion in the  left basal ganglia (series 3, image 32) with T2 and FLAIR hyperintensity. No associated hemorrhage. No significant mass effect. There is a small 9 millimeter area of restricted diffusion in the medial left temporal lobe at the amygdala (series 3, image 20). There are otherwise a small number of tiny scattered foci of restricted  diffusion in the left MCA territory (such as series 3, image 40). There is no right hemisphere or posterior circulation restricted diffusion. Patchy and scattered bilateral cerebral white matter T2 and FLAIR hyperintensity elsewhere is chronic and stable. The right deep gray matter nuclei, left thalamus, brainstem, and cerebellum remain normal for age. No midline shift, mass effect, evidence of mass lesion, ventriculomegaly, extra-axial collection or acute intracranial hemorrhage. Cervicomedullary junction and pituitary are within normal limits. Vascular: Major intracranial vascular flow voids are preserved, the left ICA flow void was abnormal in 2018 and appears mildly improved today. Negative visible cervical spine.  Normal bone marrow signal. There is mild leftward gaze deviation. Orbits soft tissues otherwise appear normal. Paranasal Visualized paranasal sinuses and mastoids are stable and well pneumatized. Scalp and face soft tissues appear negative. MRA HEAD FINDINGS Antegrade flow in the posterior circulation with codominant distal vertebral arteries. Mild distal vertebral irregularity with no stenosis. Patent vertebrobasilar junction without stenosis. Dominant appearing AICA origins are patent. Mild basilar artery irregularity without stenosis. SCA origins are patent. Bilateral PCA origins appear normal. Posterior communicating arteries are diminutive or absent. Bilateral PCA branches are mildly irregular, and there is mild right PCA P2 segment stenosis which is stable from earlier today. Antegrade flow in both ICA siphons. Mild luminal narrowing and irregularity throughout the visible left ICA may reflect residual thrombus or plaque. The left ICA is patent to the left terminus. The left MCA and ACA origins are patent. There is mild to moderate irregularity at the left ACA origin and proximal A1 segment which is stable from earlier today. There is mild irregularity scratched at the left MCA and its branches  now are patent. There is mild left M1 irregularity. The left MCA bifurcation is patent with no branch occlusion identified. Antegrade flow in the right ICA siphon which is only mildly irregular and without stenosis. Normal right ophthalmic artery origin. Normal right ICA terminus, right ACA and MCA origins. The anterior communicating artery is normal. The visible ACA branches are stable, with mild irregularity of the distal left ACA. The right MCA M1 segment is patent with mild irregularity just proximal to the bifurcation which is stable. The right MCA branches are stable and within normal limits. IMPRESSION: 1. Restored patency of the Left ICA, terminus, and Left MCA since earlier today. Mild residual irregularity throughout the left ICA siphon, and in the left M1 segment. Stable mild to moderate irregularity in the left A1 and distal A2 segments. 2. Associated core infarct primarily confined to the Left Basal Ganglia. There is a small 9 mm infarct in the left amygdala, and there are small number of scattered tiny cortical infarcts elsewhere in the Left MCA territory. 3. Mild cytotoxic edema with no associated hemorrhage or mass effect. 4. 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 Electronically Signed   By: Genevie Ann M.D.   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 The antibiotic was administered within 1 hour of the procedure 75 MCG NITROGLYCERIN INTRA ARTERIAL 650 mg aspirin via orogastric tube. Intra procedural initiation of Integrilin IV drip for dual anti-platelet therapy. ANESTHESIA/SEDATION: General endotracheal tube anesthesia with anesthesia team CONTRAST:  144 cc IV contrast FLUOROSCOPY TIME:  Fluoroscopy Time: 54 minutes 6 seconds (2,926 mGy). COMPLICATIONS: None TECHNIQUE: Informed written consent was obtained from the patient's family after a thorough discussion of the procedural risks, benefits and alternatives. Specific risks discussed include: Bleeding, infection, contrast reaction, kidney injury/failure, need for further procedure/surgery, arterial injury or dissection, embolization to new territory, intracranial hemorrhage (10-15% risk), neurologic deterioration, cardiopulmonary collapse, death. All questions were addressed. Maximal Sterile Barrier Technique was utilized including during the procedure including caps, mask, sterile gowns, sterile gloves, sterile drape, hand hygiene and skin antiseptic. A timeout was performed prior to the initiation of the procedure. FINDINGS: Initial angiogram: Left common carotid artery:  Normal course caliber and contour. Left external carotid artery: Patent with antegrade flow. On the initial injection before treatment, there is minimal opacification of the carotid siphon secondary to meningeal branches which appear to be region ating from ascending pharyngeal distribution and potentially deep meningeal branches from the external carotid artery. No significant filling of the MCA on the initial injection The initial injection does confirmed perfusion of the  lenticulostriate vasculature, as well as a patent anterior coronal artery, with minimal filling of temporal lobe. Anterior basal ganglia structures are perfused. Left internal carotid artery: Internal carotid artery is occluded at the origin with a string sign at the posterior aspect of the artery. Left MCA: Initial injection demonstrates no significant filling of the middle cerebral artery. 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 Left ACA: Anterior cerebral artery perfused by the right-sided flow at the initiation. Final angiogram: After treatment of the carotid occlusion and deployment of 8 mm-6 mm x 40 mm carotid stent, there is excellent flow through the cervical carotid to the intracranial carotid. After mechanical thrombectomy of M1 occlusion, there is modified treatment in cerebral ischemia of 3. Flat panel CT: No complications with small contrast stain of the left basal ganglia. PROCEDURE: Patient was brought to the neurointerventional suite, with the patient identity confirmed, and consent form confirmed. General anesthesia was present for initiation of general endotracheal tube anesthesia. Patient was prepped and draped in the usual sterile fashion. The only venous access was a port catheter with single needle access. Ultrasound survey of the right inguinal region was performed with images stored and sent to PACs. 11 blade scalpel was used to make a small incision. A micropuncture needle was used access the right common femoral artery under ultrasound. With excellent arterial blood flow returned, an .018 micro wire was passed through the needle, observed to enter the abdominal aorta under fluoroscopy. The needle was removed, and a micropuncture sheath was placed over the wire. The inner dilator and wire were removed, and an 035 Bentson wire  was advanced under fluoroscopy into the abdominal aorta. The sheath was removed and a standard 5 [place]  vascular sheath was placed. The dilator was removed and the sheath was flushed. After the initial common femoral artery access, ultrasound survey of the left inguinal region was performed in order to place arterial monitoring line. Images stored and sent to PACs. Micropuncture needle was used access the left common femoral artery under ultrasound. With excellent arterial blood flow returned, an .018 micro wire was passed through the needle, observed to enter the abdominal aorta under fluoroscopy. The needle was removed, and a micropuncture sheath was placed over the wire. The inner dilator and wire were removed, and an 035 Bentson wire was advanced under fluoroscopy into the abdominal aorta. The sheath was removed and a standard 4 [place] vascular sheath was placed. The dilator was removed and the sheath was flushed. A 74F JB-1 diagnostic catheter was advanced over the wire through the right common femoral artery access to the proximal descending thoracic aorta. Wire was then removed. Double flush of the catheter was performed. Catheter was then used to select the left common carotid artery. Angiogram was performed. Using roadmap technique, the catheter was advanced over a roadrunner wire into right common carotid artery. Formal angiogram was performed. Catheter was advanced over the roadrunner wire into the external carotid artery. Wire was removed. Exchange length Rosen wire was then passed through the diagnostic catheter to the distal common carotid artery and the diagnostic catheter was removed. The 5 French sheath was removed and exchanged for 8 French 55 centimeter BrightTip sheath. Sheath was flushed and attached to pressurized and heparinized saline bag for constant forward flow. Then an 8 [place], 95 cm Flowgate balloon tip catheter was prepared on the back table with inflation of the balloon with 50/50 concentration of dilute contrast. The balloon catheter was then advanced over the wire, positioned into the  distal common carotid artery. Copious back flush was performed and the balloon catheter was attached to heparinized and pressurized saline bag for forward flow. Roadmap was then performed at the carotid bifurcation. Combination of a rapid transit microwire and a synchro soft wire were used to navigate beyond the occlusion at the proximal internal carotid artery. The microcatheter was then navigated into the distal cervical segment, wire was removed, blood was aspirated, and a gentle injection confirmed a luminal position. Exchange length Transcend wire was then placed through the microcatheter into the cervical carotid. Microcatheter was removed. Balloon angioplasty was then performed with a rapid exchange system, using a Viatrac balloon 5 mm x 30 mm. The patient required treatment at this time with atropine dose, as we experienced bradycardia into the 30s. His heart rate recovered after treatment by the anesthesia team. Balloon was removed. Angiogram confirmed flow into the cervical carotid. A coaxial system of an Ace 64 penumbra aspiration catheter and the Trevo pro view 18 catheter was then advanced over the exchange length wire into the cervical carotid. Once the coaxial system was in the cervical carotid, the microwire was removed and exchanged for synchro soft. The synchro soft wire was navigated beyond the occlusion under roadmap guidance, and the aspiration catheter was advanced into the ICA terminus. The balloon guide catheter was then advanced into the mid segment of the cervical carotid artery. The microcatheter and microwire were then carefully advanced through the occluded segment. Microcatheter was then push through the occluded segment and the wire was removed. Blood was then aspirated through the hub of the microcatheter, and  a gentle contrast injection was performed confirming intraluminal position. A rotating hemostatic valve was then attached to the back end of the microcatheter, and a pressurized  and heparinized saline bag was attached to the catheter. 4 x 40 solitaire device was then selected. Back flush was achieved at the rotating hemostatic valve, and then the device was gently advanced through the microcatheter to the distal end. The retriever was then unsheathed by withdrawing the microcatheter under fluoroscopy. A 5 minutes interval was observed. At this time, ultrasound-guided access of the right common femoral vein was performed for placement of a additional venous access and placement of a 12 French triple-lumen IV catheter. We then proceeded with the mechanical thrombectomy. The balloon at the balloon tip catheter was then inflated under fluoroscopy for proximal flow arrest. Constant aspiration was then performed at the penumbra aspiration catheter at the carotid terminus as the retriever was gently and slowly withdrawn with fluoroscopic observation. Once the retriever was entirely removed from the system, free aspiration was confirmed at the hub of the aspiration catheter, with free blood return confirmed. The balloon was then deflated, rotating hemostatic valve was reattached, and a control angiogram was performed, confirming restoration of flow. Penumbra catheter was then withdrawn, and the angiogram was performed at the carotid bifurcation. Given the appearance, we elected to place acute carotid stent. The lesion was then again navigated with synchro soft wire and rapid transit. With the microwire microcatheter positioned in the distal cervical carotid, the microwire was removed. The exchange length Transcend wire was then replaced and the microcatheter was removed. Acute carotid stent was then deployed, 8 mm-6 mm x 40 mm length. Before deployment, we initiated Integrilin drip treatment for dual anti-platelet therapy. Angiogram was performed. Coaxial system of the penumbra aspiration catheter and the pro view 18 catheter were then navigated to the carotid siphon for better angiogram of the  cerebral vasculature. Angiogram was performed. Microcatheter, a number aspiration catheter were withdrawn. The balloon guide catheter was positioned in the common carotid artery for final angiogram. All catheter and wires were removed. The sheath at the right common femoral artery access was withdrawn for an angiogram. Eight French Angio-Seal was then deployed for hemostasis. A flat panel CT was then performed.  Patient was then extubated. Estimated blood loss was 50 cc No complications encountered. IMPRESSION: Status post left-sided cerebral angiogram with treatment of left ICA/MCA tandem occlusion, requiring acute left carotid stenting and mechanical thrombectomy of a left M1 occlusion with achievement of mTICI-3 flow, and restoration of cervical ICA flow. Flat panel CT in the room demonstrates small contrast stain without complicating features, and the patient was extubated. Status post deployment of 8 French Angio-Seal for right common femoral artery hemostasis. Status post ultrasound-guided left common femoral artery access with a 4 French sheath for arterial monitoring, which remained after the case. Status post ultrasound-guided right common femoral vein access for additional IV access, which remained after the case transmitting the Integrilin drip. Signed, Dulcy Fanny. Earleen Newport, DO [facility] Radiology PLAN: Patient will go to the PACU ICU admission Bilateral hips will be straight until the left 4 French arterial monitoring sheath is removed and the right common femoral vein venous access sheath is removed. Routine wound management for the right common femoral artery sheath access. Frequent neurovascular checks. IV saline hydration for 8 hours given the renal insufficiency Blood pressure control management of 120-140 with nicardipine drip ordered The goal will be dual anti-platelet therapy, for now with dose  of 650 mg aspirin and the Integrilin drip. Electronically  Signed   By: Corrie Mckusick D.O.   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 The antibiotic was administered within 1 hour of the procedure 75 MCG NITROGLYCERIN INTRA ARTERIAL 650 mg aspirin via orogastric tube. Intra procedural initiation of Integrilin IV drip for dual anti-platelet therapy. ANESTHESIA/SEDATION: General endotracheal tube anesthesia with anesthesia team CONTRAST:  144 cc IV contrast FLUOROSCOPY TIME:  Fluoroscopy Time: 54 minutes 6 seconds (2,926 mGy). COMPLICATIONS: None TECHNIQUE: Informed written consent was obtained from the patient's family after a thorough discussion of the procedural risks, benefits and alternatives. Specific risks discussed include: Bleeding, infection, contrast reaction, kidney injury/failure, need for further procedure/surgery, arterial injury or dissection, embolization to new territory, intracranial hemorrhage (10-15% risk), neurologic deterioration, cardiopulmonary collapse, death. All questions were addressed. Maximal Sterile Barrier Technique was utilized including during the procedure including  caps, mask, sterile gowns, sterile gloves, sterile drape, hand hygiene and skin antiseptic. A timeout was performed prior to the initiation of the procedure. FINDINGS: Initial angiogram: Left common carotid artery:  Normal course caliber and contour. Left external carotid artery: Patent with antegrade flow. On the initial injection before treatment, there is minimal opacification of the carotid siphon secondary to meningeal branches which appear to be region ating from ascending pharyngeal distribution and potentially deep meningeal branches from the external carotid artery. No significant filling of the MCA on the initial injection The initial injection does confirmed perfusion of the lenticulostriate vasculature, as well as a patent anterior coronal artery, with minimal filling of temporal lobe. Anterior basal ganglia structures are perfused. Left internal carotid artery: Internal carotid artery is occluded at the origin with a string sign at the posterior aspect of the artery. Left MCA: Initial injection demonstrates no significant filling of the middle cerebral artery. 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 Left ACA: Anterior cerebral artery perfused by the right-sided flow at the initiation. Final angiogram: After treatment of the carotid occlusion and deployment of 8 mm-6 mm x 40 mm carotid stent, there is excellent flow through the cervical carotid to the intracranial carotid. After mechanical thrombectomy of M1 occlusion, there is modified treatment in cerebral ischemia of 3. Flat panel CT: No complications with small contrast stain of the left basal ganglia. PROCEDURE: Patient was brought to the neurointerventional suite, with the patient identity confirmed, and consent form confirmed. General anesthesia was present for initiation of general endotracheal tube anesthesia. Patient was prepped and draped in the usual sterile fashion. The only  venous access was a port catheter with single needle access. Ultrasound survey of the right inguinal region was performed with images stored and sent to PACs. 11 blade  scalpel was used to make a small incision. A micropuncture needle was used access the right common femoral artery under ultrasound. With excellent arterial blood flow returned, an .018 micro wire was passed through the needle, observed to enter the abdominal aorta under fluoroscopy. The needle was removed, and a micropuncture sheath was placed over the wire. The inner dilator and wire were removed, and an 035 Bentson wire was advanced under fluoroscopy into the abdominal aorta. The sheath was removed and a standard 5 [place] vascular sheath was placed. The dilator was removed and the sheath was flushed. After the initial common femoral artery access, ultrasound survey of the left inguinal region was performed in order to place arterial monitoring line. Images stored and sent to PACs. Micropuncture needle was used access the left common femoral artery under ultrasound. With excellent arterial blood flow returned, an .018 micro wire was passed through the needle, observed to enter the abdominal aorta under fluoroscopy. The needle was removed, and a micropuncture sheath was placed over the wire. The inner dilator and wire were removed, and an 035 Bentson wire was advanced under fluoroscopy into the abdominal aorta. The sheath was removed and a standard 4 [place] vascular sheath was placed. The dilator was removed and the sheath was flushed. A 51F JB-1 diagnostic catheter was advanced over the wire through the right common femoral artery access to the proximal descending thoracic aorta. Wire was then removed. Double flush of the catheter was performed. Catheter was then used to select the left common carotid artery. Angiogram was performed. Using roadmap technique, the catheter was advanced over a roadrunner wire into right common carotid artery. Formal  angiogram was performed. Catheter was advanced over the roadrunner wire into the external carotid artery. Wire was removed. Exchange length Rosen wire was then passed through the diagnostic catheter to the distal common carotid artery and the diagnostic catheter was removed. The 5 French sheath was removed and exchanged for 8 French 55 centimeter BrightTip sheath. Sheath was flushed and attached to pressurized and heparinized saline bag for constant forward flow. Then an 8 [place], 95 cm Flowgate balloon tip catheter was prepared on the back table with inflation of the balloon with 50/50 concentration of dilute contrast. The balloon catheter was then advanced over the wire, positioned into the distal common carotid artery. Copious back flush was performed and the balloon catheter was attached to heparinized and pressurized saline bag for forward flow. Roadmap was then performed at the carotid bifurcation. Combination of a rapid transit microwire and a synchro soft wire were used to navigate beyond the occlusion at the proximal internal carotid artery. The microcatheter was then navigated into the distal cervical segment, wire was removed, blood was aspirated, and a gentle injection confirmed a luminal position. Exchange length Transcend wire was then placed through the microcatheter into the cervical carotid. Microcatheter was removed. Balloon angioplasty was then performed with a rapid exchange system, using a Viatrac balloon 5 mm x 30 mm. The patient required treatment at this time with atropine dose, as we experienced bradycardia into the 30s. His heart rate recovered after treatment by the anesthesia team. Balloon was removed. Angiogram confirmed flow into the cervical carotid. A coaxial system of an Ace 64 penumbra aspiration catheter and the Trevo pro view 18 catheter was then advanced over the exchange length wire into the cervical carotid. Once the coaxial system was in the cervical carotid, the microwire was  removed and exchanged for synchro soft. The synchro soft wire was  navigated beyond the occlusion under roadmap guidance, and the aspiration catheter was advanced into the ICA terminus. The balloon guide catheter was then advanced into the mid segment of the cervical carotid artery. The microcatheter and microwire were then carefully advanced through the occluded segment. Microcatheter was then push through the occluded segment and the wire was removed. Blood was then aspirated through the hub of the microcatheter, and a gentle contrast injection was performed confirming intraluminal position. A rotating hemostatic valve was then attached to the back end of the microcatheter, and a pressurized and heparinized saline bag was attached to the catheter. 4 x 40 solitaire device was then selected. Back flush was achieved at the rotating hemostatic valve, and then the device was gently advanced through the microcatheter to the distal end. The retriever was then unsheathed by withdrawing the microcatheter under fluoroscopy. A 5 minutes interval was observed. At this time, ultrasound-guided access of the right common femoral vein was performed for placement of a additional venous access and placement of a 12 French triple-lumen IV catheter. We then proceeded with the mechanical thrombectomy. The balloon at the balloon tip catheter was then inflated under fluoroscopy for proximal flow arrest. Constant aspiration was then performed at the penumbra aspiration catheter at the carotid terminus as the retriever was gently and slowly withdrawn with fluoroscopic observation. Once the retriever was entirely removed from the system, free aspiration was confirmed at the hub of the aspiration catheter, with free blood return confirmed. The balloon was then deflated, rotating hemostatic valve was reattached, and a control angiogram was performed, confirming restoration of flow. Penumbra catheter was then withdrawn, and the angiogram was  performed at the carotid bifurcation. Given the appearance, we elected to place acute carotid stent. The lesion was then again navigated with synchro soft wire and rapid transit. With the microwire microcatheter positioned in the distal cervical carotid, the microwire was removed. The exchange length Transcend wire was then replaced and the microcatheter was removed. Acute carotid stent was then deployed, 8 mm-6 mm x 40 mm length. Before deployment, we initiated Integrilin drip treatment for dual anti-platelet therapy. Angiogram was performed. Coaxial system of the penumbra aspiration catheter and the pro view 18 catheter were then navigated to the carotid siphon for better angiogram of the cerebral vasculature. Angiogram was performed. Microcatheter, a number aspiration catheter were withdrawn. The balloon guide catheter was positioned in the common carotid artery for final angiogram. All catheter and wires were removed. The sheath at the right common femoral artery access was withdrawn for an angiogram. Eight French Angio-Seal was then deployed for hemostasis. A flat panel CT was then performed.  Patient was then extubated. Estimated blood loss was 50 cc No complications encountered. IMPRESSION: Status post left-sided cerebral angiogram with treatment of left ICA/MCA tandem occlusion, requiring acute left carotid stenting and mechanical thrombectomy of a left M1 occlusion with achievement of mTICI-3 flow, and restoration of cervical ICA flow. Flat panel CT in the room demonstrates small contrast stain without complicating features, and the patient was extubated. Status post deployment of 8 French Angio-Seal for right common femoral artery hemostasis. Status post ultrasound-guided left common femoral artery access with a 4 French sheath for arterial monitoring, which remained after the case. Status post ultrasound-guided right common femoral vein access for additional IV access, which remained after the case  transmitting the Integrilin drip. Signed, Dulcy Fanny. Earleen Newport DO [facility] Radiology PLAN: Patient will go to the PACU ICU admission Bilateral hips  will be straight until the left 4 French arterial monitoring sheath is removed and the right common femoral vein venous access sheath is removed. Routine wound management for the right common femoral artery sheath access. Frequent neurovascular checks. IV saline hydration for 8 hours given the renal insufficiency Blood pressure control management of 120-140 with nicardipine drip ordered The goal will be dual anti-platelet therapy, for now with dose of 650 mg aspirin and the Integrilin drip. Electronically Signed   By: Corrie Mckusick D.O.   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 The antibiotic was administered within 1 hour of the procedure 75 MCG NITROGLYCERIN INTRA ARTERIAL 650 mg aspirin via orogastric tube. Intra procedural initiation of Integrilin IV drip for dual anti-platelet therapy. ANESTHESIA/SEDATION: General endotracheal tube anesthesia with anesthesia team  CONTRAST:  144 cc IV contrast FLUOROSCOPY TIME:  Fluoroscopy Time: 54 minutes 6 seconds (2,926 mGy). COMPLICATIONS: None TECHNIQUE: Informed written consent was obtained from the patient's family after a thorough discussion of the procedural risks, benefits and alternatives. Specific risks discussed include: Bleeding, infection, contrast reaction, kidney injury/failure, need for further procedure/surgery, arterial injury or dissection, embolization to new territory, intracranial hemorrhage (10-15% risk), neurologic deterioration, cardiopulmonary collapse, death. All questions were addressed. Maximal Sterile Barrier Technique was utilized including during the procedure including caps, mask, sterile gowns, sterile gloves, sterile drape, hand hygiene and skin antiseptic. A timeout was performed prior to the initiation of the procedure. FINDINGS: Initial angiogram: Left common carotid artery:  Normal course caliber and contour. Left external carotid artery: Patent with antegrade flow. On the initial injection before treatment, there is minimal opacification of the carotid siphon secondary to meningeal branches which appear to be region ating from ascending pharyngeal distribution and potentially deep meningeal branches from the external carotid artery. No significant filling of the MCA on the initial injection The initial injection does confirmed perfusion of the lenticulostriate vasculature, as well as a patent anterior coronal artery, with minimal filling of temporal lobe. Anterior basal ganglia structures are perfused. Left internal carotid artery: Internal carotid artery is occluded at the origin with a string sign at the posterior aspect of the artery. Left MCA: Initial injection demonstrates no significant filling of the middle cerebral artery. 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 Left ACA: Anterior cerebral artery perfused by the right-sided flow  at the initiation. Final angiogram: After treatment of the carotid occlusion and deployment of 8 mm-6 mm x 40 mm carotid stent, there is excellent flow through the cervical carotid to the intracranial carotid. After mechanical thrombectomy of M1 occlusion, there is modified treatment in cerebral ischemia of 3. Flat panel CT: No complications with small contrast stain of the left basal ganglia. PROCEDURE: Patient was  brought to the neurointerventional suite, with the patient identity confirmed, and consent form confirmed. General anesthesia was present for initiation of general endotracheal tube anesthesia. Patient was prepped and draped in the usual sterile fashion. The only venous access was a port catheter with single needle access. Ultrasound survey of the right inguinal region was performed with images stored and sent to PACs. 11 blade scalpel was used to make a small incision. A micropuncture needle was used access the right common femoral artery under ultrasound. With excellent arterial blood flow returned, an .018 micro wire was passed through the needle, observed to enter the abdominal aorta under fluoroscopy. The needle was removed, and a micropuncture sheath was placed over the wire. The inner dilator and wire were removed, and an 035 Bentson wire was advanced under fluoroscopy into the abdominal aorta. The sheath was removed and a standard 5 [place] vascular sheath was placed. The dilator was removed and the sheath was flushed. After the initial common femoral artery access, ultrasound survey of the left inguinal region was performed in order to place arterial monitoring line. Images stored and sent to PACs. Micropuncture needle was used access the left common femoral artery under ultrasound. With excellent arterial blood flow returned, an .018 micro wire was passed through the needle, observed to enter the abdominal aorta under fluoroscopy. The needle was removed, and a micropuncture sheath was placed  over the wire. The inner dilator and wire were removed, and an 035 Bentson wire was advanced under fluoroscopy into the abdominal aorta. The sheath was removed and a standard 4 [place] vascular sheath was placed. The dilator was removed and the sheath was flushed. A 32F JB-1 diagnostic catheter was advanced over the wire through the right common femoral artery access to the proximal descending thoracic aorta. Wire was then removed. Double flush of the catheter was performed. Catheter was then used to select the left common carotid artery. Angiogram was performed. Using roadmap technique, the catheter was advanced over a roadrunner wire into right common carotid artery. Formal angiogram was performed. Catheter was advanced over the roadrunner wire into the external carotid artery. Wire was removed. Exchange length Rosen wire was then passed through the diagnostic catheter to the distal common carotid artery and the diagnostic catheter was removed. The 5 French sheath was removed and exchanged for 8 French 55 centimeter BrightTip sheath. Sheath was flushed and attached to pressurized and heparinized saline bag for constant forward flow. Then an 8 [place], 95 cm Flowgate balloon tip catheter was prepared on the back table with inflation of the balloon with 50/50 concentration of dilute contrast. The balloon catheter was then advanced over the wire, positioned into the distal common carotid artery. Copious back flush was performed and the balloon catheter was attached to heparinized and pressurized saline bag for forward flow. Roadmap was then performed at the carotid bifurcation. Combination of a rapid transit microwire and a synchro soft wire were used to navigate beyond the occlusion at the proximal internal carotid artery. The microcatheter was then navigated into the distal cervical segment, wire was removed, blood was aspirated, and a gentle injection confirmed a luminal position. Exchange length Transcend wire was  then placed through the microcatheter into the cervical carotid. Microcatheter was removed. Balloon angioplasty was then performed with a rapid exchange system, using a Viatrac balloon 5 mm x 30 mm. The patient required treatment at this time with atropine dose, as we experienced bradycardia into the 30s. His heart rate recovered after treatment by  the anesthesia team. Balloon was removed. Angiogram confirmed flow into the cervical carotid. A coaxial system of an Ace 64 penumbra aspiration catheter and the Trevo pro view 18 catheter was then advanced over the exchange length wire into the cervical carotid. Once the coaxial system was in the cervical carotid, the microwire was removed and exchanged for synchro soft. The synchro soft wire was navigated beyond the occlusion under roadmap guidance, and the aspiration catheter was advanced into the ICA terminus. The balloon guide catheter was then advanced into the mid segment of the cervical carotid artery. The microcatheter and microwire were then carefully advanced through the occluded segment. Microcatheter was then push through the occluded segment and the wire was removed. Blood was then aspirated through the hub of the microcatheter, and a gentle contrast injection was performed confirming intraluminal position. A rotating hemostatic valve was then attached to the back end of the microcatheter, and a pressurized and heparinized saline bag was attached to the catheter. 4 x 40 solitaire device was then selected. Back flush was achieved at the rotating hemostatic valve, and then the device was gently advanced through the microcatheter to the distal end. The retriever was then unsheathed by withdrawing the microcatheter under fluoroscopy. A 5 minutes interval was observed. At this time, ultrasound-guided access of the right common femoral vein was performed for placement of a additional venous access and placement of a 12 French triple-lumen IV catheter. We then  proceeded with the mechanical thrombectomy. The balloon at the balloon tip catheter was then inflated under fluoroscopy for proximal flow arrest. Constant aspiration was then performed at the penumbra aspiration catheter at the carotid terminus as the retriever was gently and slowly withdrawn with fluoroscopic observation. Once the retriever was entirely removed from the system, free aspiration was confirmed at the hub of the aspiration catheter, with free blood return confirmed. The balloon was then deflated, rotating hemostatic valve was reattached, and a control angiogram was performed, confirming restoration of flow. Penumbra catheter was then withdrawn, and the angiogram was performed at the carotid bifurcation. Given the appearance, we elected to place acute carotid stent. The lesion was then again navigated with synchro soft wire and rapid transit. With the microwire microcatheter positioned in the distal cervical carotid, the microwire was removed. The exchange length Transcend wire was then replaced and the microcatheter was removed. Acute carotid stent was then deployed, 8 mm-6 mm x 40 mm length. Before deployment, we initiated Integrilin drip treatment for dual anti-platelet therapy. Angiogram was performed. Coaxial system of the penumbra aspiration catheter and the pro view 18 catheter were then navigated to the carotid siphon for better angiogram of the cerebral vasculature. Angiogram was performed. Microcatheter, a number aspiration catheter were withdrawn. The balloon guide catheter was positioned in the common carotid artery for final angiogram. All catheter and wires were removed. The sheath at the right common femoral artery access was withdrawn for an angiogram. Eight French Angio-Seal was then deployed for hemostasis. A flat panel CT was then performed.  Patient was then extubated. Estimated blood loss was 50 cc No complications encountered. IMPRESSION: Status post left-sided cerebral angiogram  with treatment of left ICA/MCA tandem occlusion, requiring acute left carotid stenting and mechanical thrombectomy of a left M1 occlusion with achievement of mTICI-3 flow, and restoration of cervical ICA flow. Flat panel CT in the room demonstrates small contrast stain without complicating features, and the patient was extubated. Status post deployment of 8 French Angio-Seal for right common femoral artery hemostasis.  Status post ultrasound-guided left common femoral artery access with a 4 French sheath for arterial monitoring, which remained after the case. Status post ultrasound-guided right common femoral vein access for additional IV access, which remained after the case transmitting the Integrilin drip. Signed, Dulcy Fanny. Earleen Newport, DO [facility] Radiology PLAN: Patient will go to the PACU ICU admission Bilateral hips will be straight until the left 4 French arterial monitoring sheath is removed and the right common femoral vein venous access sheath is removed. Routine wound management for the right common femoral artery sheath access. Frequent neurovascular checks. IV saline hydration for 8 hours given the renal insufficiency Blood pressure control management of 120-140 with nicardipine drip ordered The goal will be dual anti-platelet therapy, for now with dose of 650 mg aspirin and the Integrilin drip. Electronically Signed   By: Corrie Mckusick D.O.   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 The antibiotic was administered within 1 hour of the procedure 75 MCG NITROGLYCERIN INTRA ARTERIAL 650 mg aspirin via orogastric tube. Intra procedural initiation of Integrilin IV drip for dual anti-platelet therapy. ANESTHESIA/SEDATION: General endotracheal tube anesthesia with anesthesia team CONTRAST:  144 cc IV contrast FLUOROSCOPY TIME:  Fluoroscopy Time: 54 minutes 6 seconds (2,926 mGy). COMPLICATIONS: None TECHNIQUE: Informed written consent was obtained from the patient's family after a thorough discussion of the procedural risks, benefits and alternatives. Specific risks discussed include: Bleeding, infection, contrast reaction, kidney injury/failure, need for further procedure/surgery, arterial injury or dissection, embolization to new territory, intracranial hemorrhage (10-15% risk), neurologic deterioration, cardiopulmonary collapse, death. All questions were addressed. Maximal Sterile Barrier Technique was utilized including during the procedure including caps, mask, sterile gowns, sterile gloves, sterile drape, hand hygiene and skin antiseptic. A timeout was performed prior to the initiation of the procedure. FINDINGS: Initial angiogram: Left common carotid artery:  Normal course caliber and contour. Left external carotid artery: Patent with antegrade flow. On the initial injection before treatment, there is minimal opacification of the carotid siphon secondary to meningeal branches which appear to be region ating from ascending pharyngeal distribution and potentially deep meningeal branches from the external carotid artery.  No significant filling of the MCA on the initial injection The initial injection does confirmed perfusion of the lenticulostriate vasculature, as well as a patent anterior coronal artery, with minimal filling of temporal lobe. Anterior basal ganglia structures are perfused. Left internal carotid artery: Internal carotid artery is occluded at the origin with a string sign at the posterior aspect of the artery. Left MCA: Initial injection demonstrates no significant filling of the middle cerebral artery. 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 Left ACA: Anterior cerebral artery perfused by the right-sided flow at  the initiation. Final angiogram: After treatment of the carotid occlusion and deployment of 8 mm-6 mm x 40 mm carotid stent, there is excellent flow through the cervical carotid to the intracranial carotid. After mechanical thrombectomy of M1 occlusion, there is modified treatment in cerebral ischemia of 3. Flat panel CT: No complications with small contrast stain of the left basal ganglia. PROCEDURE: Patient was brought to the neurointerventional suite, with the patient identity confirmed, and consent form confirmed. General anesthesia was present for initiation of general endotracheal tube anesthesia. Patient was prepped and draped in the usual sterile fashion. The only venous access was a port catheter with single needle access. Ultrasound survey of the right inguinal region was performed with images stored and sent to PACs. 11 blade scalpel was used to make a small incision. A micropuncture needle was used access the right common femoral artery under ultrasound. With excellent arterial blood flow returned, an .018 micro wire was passed through the needle, observed to enter the abdominal aorta under fluoroscopy. The needle was removed, and a micropuncture sheath was placed over the wire. The inner dilator and wire were removed, and an 035  Bentson wire was advanced under fluoroscopy into the abdominal aorta. The sheath was removed and a standard 5 [place] vascular sheath was placed. The dilator was removed and the sheath was flushed. After the initial common femoral artery access, ultrasound survey of the left inguinal region was performed in order to place arterial monitoring line. Images stored and sent to PACs. Micropuncture needle was used access the left common femoral artery under ultrasound. With excellent arterial blood flow returned, an .018 micro wire was passed through the needle, observed to enter the abdominal aorta under fluoroscopy. The needle was removed, and a micropuncture sheath was placed over the wire. The inner dilator and wire were removed, and an 035 Bentson wire was advanced under fluoroscopy into the abdominal aorta. The sheath was removed and a standard 4 [place] vascular sheath was placed. The dilator was removed and the sheath was flushed. A 63F JB-1 diagnostic catheter was advanced over the wire through the right common femoral artery access to the proximal descending thoracic aorta. Wire was then removed. Double flush of the catheter was performed. Catheter was then used to select the left common carotid artery. Angiogram was performed. Using roadmap technique, the catheter was advanced over a roadrunner wire into right common carotid artery. Formal angiogram was performed. Catheter was advanced over the roadrunner wire into the external carotid artery. Wire was removed. Exchange length Rosen wire was then passed through the diagnostic catheter to the distal common carotid artery and the diagnostic catheter was removed. The 5 French sheath was removed and exchanged for 8 French 55 centimeter BrightTip sheath. Sheath was flushed and attached to pressurized and heparinized saline bag for constant forward flow. Then an 8 [place], 95 cm Flowgate balloon tip catheter was prepared on the back table with inflation of the balloon  with 50/50 concentration of dilute contrast. The balloon catheter was then advanced over the wire, positioned into the distal common carotid artery. Copious back flush was performed and the balloon catheter was attached to heparinized and pressurized saline bag for forward flow. Roadmap was then performed at the carotid bifurcation. Combination of a rapid transit microwire and a synchro soft wire were used to navigate beyond the occlusion at the proximal internal carotid artery. The microcatheter was then navigated into the distal cervical segment, wire was removed, blood was aspirated, and a gentle  injection confirmed a luminal position. Exchange length Transcend wire was then placed through the microcatheter into the cervical carotid. Microcatheter was removed. Balloon angioplasty was then performed with a rapid exchange system, using a Viatrac balloon 5 mm x 30 mm. The patient required treatment at this time with atropine dose, as we experienced bradycardia into the 30s. His heart rate recovered after treatment by the anesthesia team. Balloon was removed. Angiogram confirmed flow into the cervical carotid. A coaxial system of an Ace 64 penumbra aspiration catheter and the Trevo pro view 18 catheter was then advanced over the exchange length wire into the cervical carotid. Once the coaxial system was in the cervical carotid, the microwire was removed and exchanged for synchro soft. The synchro soft wire was navigated beyond the occlusion under roadmap guidance, and the aspiration catheter was advanced into the ICA terminus. The balloon guide catheter was then advanced into the mid segment of the cervical carotid artery. The microcatheter and microwire were then carefully advanced through the occluded segment. Microcatheter was then push through the occluded segment and the wire was removed. Blood was then aspirated through the hub of the microcatheter, and a gentle contrast injection was performed confirming  intraluminal position. A rotating hemostatic valve was then attached to the back end of the microcatheter, and a pressurized and heparinized saline bag was attached to the catheter. 4 x 40 solitaire device was then selected. Back flush was achieved at the rotating hemostatic valve, and then the device was gently advanced through the microcatheter to the distal end. The retriever was then unsheathed by withdrawing the microcatheter under fluoroscopy. A 5 minutes interval was observed. At this time, ultrasound-guided access of the right common femoral vein was performed for placement of a additional venous access and placement of a 12 French triple-lumen IV catheter. We then proceeded with the mechanical thrombectomy. The balloon at the balloon tip catheter was then inflated under fluoroscopy for proximal flow arrest. Constant aspiration was then performed at the penumbra aspiration catheter at the carotid terminus as the retriever was gently and slowly withdrawn with fluoroscopic observation. Once the retriever was entirely removed from the system, free aspiration was confirmed at the hub of the aspiration catheter, with free blood return confirmed. The balloon was then deflated, rotating hemostatic valve was reattached, and a control angiogram was performed, confirming restoration of flow. Penumbra catheter was then withdrawn, and the angiogram was performed at the carotid bifurcation. Given the appearance, we elected to place acute carotid stent. The lesion was then again navigated with synchro soft wire and rapid transit. With the microwire microcatheter positioned in the distal cervical carotid, the microwire was removed. The exchange length Transcend wire was then replaced and the microcatheter was removed. Acute carotid stent was then deployed, 8 mm-6 mm x 40 mm length. Before deployment, we initiated Integrilin drip treatment for dual anti-platelet therapy. Angiogram was performed. Coaxial system of the  penumbra aspiration catheter and the pro view 18 catheter were then navigated to the carotid siphon for better angiogram of the cerebral vasculature. Angiogram was performed. Microcatheter, a number aspiration catheter were withdrawn. The balloon guide catheter was positioned in the common carotid artery for final angiogram. All catheter and wires were removed. The sheath at the right common femoral artery access was withdrawn for an angiogram. Eight French Angio-Seal was then deployed for hemostasis. A flat panel CT was then performed.  Patient was then extubated. Estimated blood loss was 50 cc No complications encountered. IMPRESSION: Status post  left-sided cerebral angiogram with treatment of left ICA/MCA tandem occlusion, requiring acute left carotid stenting and mechanical thrombectomy of a left M1 occlusion with achievement of mTICI-3 flow, and restoration of cervical ICA flow. Flat panel CT in the room demonstrates small contrast stain without complicating features, and the patient was extubated. Status post deployment of 8 French Angio-Seal for right common femoral artery hemostasis. Status post ultrasound-guided left common femoral artery access with a 4 French sheath for arterial monitoring, which remained after the case. Status post ultrasound-guided right common femoral vein access for additional IV access, which remained after the case transmitting the Integrilin drip. Signed, Dulcy Fanny. Earleen Newport, DO [facility] Radiology PLAN: Patient will go to the PACU ICU admission Bilateral hips will be straight until the left 4 French arterial monitoring sheath is removed and the right common femoral vein venous access sheath is removed. Routine wound management for the right common femoral artery sheath access. Frequent neurovascular checks. IV saline hydration for 8 hours given the renal insufficiency Blood pressure control management of 120-140 with nicardipine drip  ordered The goal will be dual anti-platelet therapy, for now with dose of 650 mg aspirin and the Integrilin drip. Electronically Signed   By: Corrie Mckusick D.O.   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 The antibiotic was administered within 1 hour of the procedure 75 MCG NITROGLYCERIN INTRA ARTERIAL 650 mg aspirin via orogastric tube. Intra procedural initiation of Integrilin IV drip for dual anti-platelet therapy. ANESTHESIA/SEDATION: General endotracheal tube anesthesia with anesthesia team CONTRAST:  144 cc IV contrast FLUOROSCOPY TIME:  Fluoroscopy Time: 54 minutes 6 seconds (2,926 mGy). COMPLICATIONS: None TECHNIQUE: Informed written consent was obtained from the patient's family after a thorough discussion of the procedural risks, benefits and alternatives. Specific risks discussed include: Bleeding, infection, contrast reaction, kidney injury/failure, need for further procedure/surgery, arterial injury or dissection, embolization to new territory, intracranial hemorrhage (10-15% risk), neurologic deterioration, cardiopulmonary collapse, death. All questions  were addressed. Maximal Sterile Barrier Technique was utilized including during the procedure including caps, mask, sterile gowns, sterile gloves, sterile drape, hand hygiene and skin antiseptic. A timeout was performed prior to the initiation of the procedure. FINDINGS: Initial angiogram: Left common carotid artery:  Normal course caliber and contour. Left external carotid artery: Patent with antegrade flow. On the initial injection before treatment, there is minimal opacification of the carotid siphon secondary to meningeal branches which appear to be region ating from ascending pharyngeal distribution and potentially deep meningeal branches from the external carotid artery. No significant filling of the MCA on the initial injection The initial injection does confirmed perfusion of the lenticulostriate vasculature, as well as a patent anterior coronal artery, with minimal filling of temporal lobe. Anterior basal ganglia structures  are perfused. Left internal carotid artery: Internal carotid artery is occluded at the origin with a string sign at the posterior aspect of the artery. Left MCA: Initial injection demonstrates no significant filling of the middle cerebral artery. 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 Left ACA: Anterior cerebral artery perfused by the right-sided flow at the initiation. Final angiogram: After treatment of the carotid occlusion and deployment of 8 mm-6 mm x 40 mm carotid stent, there is excellent flow through the cervical carotid to the intracranial carotid. After mechanical thrombectomy of M1 occlusion, there is modified treatment in cerebral ischemia of 3. Flat panel CT: No complications with small contrast stain of the left basal ganglia. PROCEDURE: Patient was brought to the neurointerventional suite, with the patient identity confirmed, and consent form confirmed. General anesthesia was present for initiation of general  endotracheal tube anesthesia. Patient was prepped and draped in the usual sterile fashion. The only venous access was a port catheter with single needle access. Ultrasound survey of the right inguinal region was performed with images stored and sent to PACs. 11 blade scalpel was used to make a small incision. A micropuncture needle was used access the right common femoral artery under ultrasound. With excellent arterial blood flow returned, an .018 micro wire was passed through the needle, observed to enter the abdominal aorta under fluoroscopy. The needle was removed, and a micropuncture sheath was placed over the wire. The inner dilator and wire were removed, and an 035 Bentson wire was advanced under fluoroscopy into the abdominal aorta. The sheath was removed and a standard 5 [place] vascular sheath was placed. The dilator was removed and the sheath was flushed. After the initial common femoral artery access, ultrasound survey of the left inguinal region was performed in order to place arterial monitoring line. Images stored and sent to PACs. Micropuncture needle was used access the left common femoral artery under ultrasound. With excellent arterial blood flow returned, an .018 micro wire was passed through the needle, observed to enter the abdominal aorta under fluoroscopy. The needle was removed, and a micropuncture sheath was placed over the wire. The inner dilator and wire were removed, and an 035 Bentson wire was advanced under fluoroscopy into the abdominal aorta. The sheath was removed and a standard 4 [place] vascular sheath was placed. The dilator was removed and the sheath was flushed. A 50F JB-1 diagnostic catheter was advanced over the wire through the right common femoral artery access to the proximal descending thoracic aorta. Wire was then removed. Double flush of the catheter was performed. Catheter was then used to select the left common carotid artery. Angiogram was performed. Using roadmap  technique, the catheter was advanced over a roadrunner wire into right common carotid artery. Formal angiogram was performed. Catheter was advanced over the roadrunner wire into the external carotid artery. Wire was removed. Exchange length Rosen wire was then passed through the diagnostic catheter to the distal common carotid artery and the diagnostic catheter was removed. The 5 French sheath was removed and exchanged for 8 French 55 centimeter BrightTip sheath. Sheath was flushed and attached to pressurized and heparinized saline bag for constant forward flow. Then an 8 [place], 95 cm Flowgate balloon tip catheter was prepared on the back table with inflation of the balloon with 50/50 concentration of dilute contrast. The balloon catheter was then advanced over the wire, positioned into the distal common carotid artery. Copious back flush was performed and  the balloon catheter was attached to heparinized and pressurized saline bag for forward flow. Roadmap was then performed at the carotid bifurcation. Combination of a rapid transit microwire and a synchro soft wire were used to navigate beyond the occlusion at the proximal internal carotid artery. The microcatheter was then navigated into the distal cervical segment, wire was removed, blood was aspirated, and a gentle injection confirmed a luminal position. Exchange length Transcend wire was then placed through the microcatheter into the cervical carotid. Microcatheter was removed. Balloon angioplasty was then performed with a rapid exchange system, using a Viatrac balloon 5 mm x 30 mm. The patient required treatment at this time with atropine dose, as we experienced bradycardia into the 30s. His heart rate recovered after treatment by the anesthesia team. Balloon was removed. Angiogram confirmed flow into the cervical carotid. A coaxial system of an Ace 64 penumbra aspiration catheter and the Trevo pro view 18 catheter was then advanced over the exchange length  wire into the cervical carotid. Once the coaxial system was in the cervical carotid, the microwire was removed and exchanged for synchro soft. The synchro soft wire was navigated beyond the occlusion under roadmap guidance, and the aspiration catheter was advanced into the ICA terminus. The balloon guide catheter was then advanced into the mid segment of the cervical carotid artery. The microcatheter and microwire were then carefully advanced through the occluded segment. Microcatheter was then push through the occluded segment and the wire was removed. Blood was then aspirated through the hub of the microcatheter, and a gentle contrast injection was performed confirming intraluminal position. A rotating hemostatic valve was then attached to the back end of the microcatheter, and a pressurized and heparinized saline bag was attached to the catheter. 4 x 40 solitaire device was then selected. Back flush was achieved at the rotating hemostatic valve, and then the device was gently advanced through the microcatheter to the distal end. The retriever was then unsheathed by withdrawing the microcatheter under fluoroscopy. A 5 minutes interval was observed. At this time, ultrasound-guided access of the right common femoral vein was performed for placement of a additional venous access and placement of a 12 French triple-lumen IV catheter. We then proceeded with the mechanical thrombectomy. The balloon at the balloon tip catheter was then inflated under fluoroscopy for proximal flow arrest. Constant aspiration was then performed at the penumbra aspiration catheter at the carotid terminus as the retriever was gently and slowly withdrawn with fluoroscopic observation. Once the retriever was entirely removed from the system, free aspiration was confirmed at the hub of the aspiration catheter, with free blood return confirmed. The balloon was then deflated, rotating hemostatic valve was reattached, and a control angiogram was  performed, confirming restoration of flow. Penumbra catheter was then withdrawn, and the angiogram was performed at the carotid bifurcation. Given the appearance, we elected to place acute carotid stent. The lesion was then again navigated with synchro soft wire and rapid transit. With the microwire microcatheter positioned in the distal cervical carotid, the microwire was removed. The exchange length Transcend wire was then replaced and the microcatheter was removed. Acute carotid stent was then deployed, 8 mm-6 mm x 40 mm length. Before deployment, we initiated Integrilin drip treatment for dual anti-platelet therapy. Angiogram was performed. Coaxial system of the penumbra aspiration catheter and the pro view 18 catheter were then navigated to the carotid siphon for better angiogram of the cerebral vasculature. Angiogram was performed. Microcatheter, a number aspiration catheter were withdrawn. The  balloon guide catheter was positioned in the common carotid artery for final angiogram. All catheter and wires were removed. The sheath at the right common femoral artery access was withdrawn for an angiogram. Eight French Angio-Seal was then deployed for hemostasis. A flat panel CT was then performed.  Patient was then extubated. Estimated blood loss was 50 cc No complications encountered. IMPRESSION: Status post left-sided cerebral angiogram with treatment of left ICA/MCA tandem occlusion, requiring acute left carotid stenting and mechanical thrombectomy of a left M1 occlusion with achievement of mTICI-3 flow, and restoration of cervical ICA flow. Flat panel CT in the room demonstrates small contrast stain without complicating features, and the patient was extubated. Status post deployment of 8 French Angio-Seal for right common femoral artery hemostasis. Status post ultrasound-guided left common femoral artery access with a 4 French sheath for arterial monitoring, which remained after the case. Status post  ultrasound-guided right common femoral vein access for additional IV access, which remained after the case transmitting the Integrilin drip. Signed, Dulcy Fanny. Earleen Newport, DO [facility] Radiology PLAN: Patient will go to the PACU ICU admission Bilateral hips will be straight until the left 4 French arterial monitoring sheath is removed and the right common femoral vein venous access sheath is removed. Routine wound management for the right common femoral artery sheath access. Frequent neurovascular checks. IV saline hydration for 8 hours given the renal insufficiency Blood pressure control management of 120-140 with nicardipine drip ordered The goal will be dual anti-platelet therapy, for now with dose of 650 mg aspirin and the Integrilin drip. Electronically Signed   By: Corrie Mckusick D.O.   On: 05/06/2017 09:24   Dg Chest Port 1 View  Result Date: 05/06/2017 CLINICAL DATA:  Shortness of breath. Patient status post left MCA thrombectomy and left ICA stent placement today. EXAM: PORTABLE CHEST 1 VIEW COMPARISON:  PA and lateral chest 03/18/2017.  CT chest 03/24/2017. FINDINGS: Interstitial prominence about the patient's left upper lobe pulmonary mass is worse than on the prior plain films of the chest. Mild basilar atelectasis is noted. Heart size is normal. Port-A-Cath is in place. Aortic atherosclerosis is seen. IMPRESSION: Increased interstitial opacities about the patient's left upper lobe pulmonary mass are nonspecific and could be due to tumor spread, postobstructive pneumonitis or less likely hemorrhage. Atherosclerosis. Electronically Signed   By: Inge Rise M.D.   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 The antibiotic was administered within 1 hour of the procedure 75 MCG NITROGLYCERIN INTRA ARTERIAL 650 mg aspirin via orogastric tube. Intra procedural initiation of Integrilin IV drip for dual anti-platelet therapy. ANESTHESIA/SEDATION: General endotracheal tube anesthesia with anesthesia team CONTRAST:  144 cc IV contrast FLUOROSCOPY TIME:  Fluoroscopy Time: 54 minutes 6  seconds (2,926 mGy). COMPLICATIONS: None TECHNIQUE: Informed written consent was obtained from the patient's family after a thorough discussion of the procedural risks, benefits and alternatives. Specific risks discussed include: Bleeding, infection, contrast reaction, kidney injury/failure, need for further procedure/surgery, arterial injury or dissection, embolization to new territory, intracranial hemorrhage (10-15% risk), neurologic deterioration, cardiopulmonary collapse, death. All questions were addressed. Maximal Sterile Barrier Technique was utilized including during the procedure including caps, mask, sterile gowns, sterile gloves, sterile drape, hand hygiene and skin antiseptic. A timeout was performed prior to the initiation of the procedure. FINDINGS: Initial angiogram: Left common carotid artery:  Normal course caliber and contour. Left external carotid artery: Patent with antegrade  flow. On the initial injection before treatment, there is minimal opacification of the carotid siphon secondary to meningeal branches which appear to be region ating from ascending pharyngeal distribution and potentially deep meningeal branches from the external carotid artery. No significant filling of the MCA on the initial injection The initial injection does confirmed perfusion of the lenticulostriate vasculature, as well as a patent anterior coronal artery, with minimal filling of temporal lobe. Anterior basal ganglia structures are perfused. Left internal carotid artery: Internal carotid artery is occluded at the origin with a string sign at the posterior aspect of the artery. Left MCA: Initial injection demonstrates no significant filling of the middle cerebral artery. 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 Left ACA: Anterior cerebral artery perfused by the right-sided flow at the initiation. Final angiogram: After treatment of the carotid occlusion and deployment of 8 mm-6 mm x 40 mm carotid stent, there is excellent flow through the cervical carotid to the intracranial carotid. After mechanical thrombectomy of M1 occlusion, there is modified treatment in cerebral ischemia of 3. Flat panel CT: No complications with small contrast stain of the left basal ganglia. PROCEDURE: Patient was brought to the neurointerventional suite, with the patient identity confirmed, and consent form confirmed. General anesthesia was present for initiation of general endotracheal tube anesthesia. Patient was prepped and draped in the usual sterile fashion. The only venous access was a port catheter with single needle access. Ultrasound survey of the right inguinal region was performed with images stored and sent to PACs. 11 blade scalpel was used to make a small incision. A micropuncture needle was used access the right common femoral artery under ultrasound. With  excellent arterial blood flow returned, an .018 micro wire was passed through the needle, observed to enter the abdominal aorta under fluoroscopy. The needle was removed, and a micropuncture sheath was placed over the wire. The inner dilator and wire were removed, and an 035 Bentson wire was advanced under fluoroscopy into the abdominal aorta. The sheath was removed and a standard 5 [place] vascular sheath was placed. The dilator was removed and the sheath was flushed. After the initial common femoral artery access, ultrasound survey of the left inguinal region was performed in order to place arterial monitoring line. Images stored and sent to PACs. Micropuncture needle was used access the left common femoral artery under ultrasound. With excellent arterial blood flow returned, an .018 micro wire was passed through the needle, observed to enter the abdominal aorta under fluoroscopy. The needle was removed, and a micropuncture sheath was placed over the wire. The inner dilator and wire were removed, and an 035 Bentson wire was advanced under fluoroscopy into the abdominal aorta. The sheath was removed and a standard 4 [place] vascular sheath was placed. The  dilator was removed and the sheath was flushed. A 25F JB-1 diagnostic catheter was advanced over the wire through the right common femoral artery access to the proximal descending thoracic aorta. Wire was then removed. Double flush of the catheter was performed. Catheter was then used to select the left common carotid artery. Angiogram was performed. Using roadmap technique, the catheter was advanced over a roadrunner wire into right common carotid artery. Formal angiogram was performed. Catheter was advanced over the roadrunner wire into the external carotid artery. Wire was removed. Exchange length Rosen wire was then passed through the diagnostic catheter to the distal common carotid artery and the diagnostic catheter was removed. The 5 French sheath was removed  and exchanged for 8 French 55 centimeter BrightTip sheath. Sheath was flushed and attached to pressurized and heparinized saline bag for constant forward flow. Then an 8 [place], 95 cm Flowgate balloon tip catheter was prepared on the back table with inflation of the balloon with 50/50 concentration of dilute contrast. The balloon catheter was then advanced over the wire, positioned into the distal common carotid artery. Copious back flush was performed and the balloon catheter was attached to heparinized and pressurized saline bag for forward flow. Roadmap was then performed at the carotid bifurcation. Combination of a rapid transit microwire and a synchro soft wire were used to navigate beyond the occlusion at the proximal internal carotid artery. The microcatheter was then navigated into the distal cervical segment, wire was removed, blood was aspirated, and a gentle injection confirmed a luminal position. Exchange length Transcend wire was then placed through the microcatheter into the cervical carotid. Microcatheter was removed. Balloon angioplasty was then performed with a rapid exchange system, using a Viatrac balloon 5 mm x 30 mm. The patient required treatment at this time with atropine dose, as we experienced bradycardia into the 30s. His heart rate recovered after treatment by the anesthesia team. Balloon was removed. Angiogram confirmed flow into the cervical carotid. A coaxial system of an Ace 64 penumbra aspiration catheter and the Trevo pro view 18 catheter was then advanced over the exchange length wire into the cervical carotid. Once the coaxial system was in the cervical carotid, the microwire was removed and exchanged for synchro soft. The synchro soft wire was navigated beyond the occlusion under roadmap guidance, and the aspiration catheter was advanced into the ICA terminus. The balloon guide catheter was then advanced into the mid segment of the cervical carotid artery. The microcatheter and  microwire were then carefully advanced through the occluded segment. Microcatheter was then push through the occluded segment and the wire was removed. Blood was then aspirated through the hub of the microcatheter, and a gentle contrast injection was performed confirming intraluminal position. A rotating hemostatic valve was then attached to the back end of the microcatheter, and a pressurized and heparinized saline bag was attached to the catheter. 4 x 40 solitaire device was then selected. Back flush was achieved at the rotating hemostatic valve, and then the device was gently advanced through the microcatheter to the distal end. The retriever was then unsheathed by withdrawing the microcatheter under fluoroscopy. A 5 minutes interval was observed. At this time, ultrasound-guided access of the right common femoral vein was performed for placement of a additional venous access and placement of a 12 French triple-lumen IV catheter. We then proceeded with the mechanical thrombectomy. The balloon at the balloon tip catheter was then inflated under fluoroscopy for proximal flow arrest. Constant aspiration was then performed at  the penumbra aspiration catheter at the carotid terminus as the retriever was gently and slowly withdrawn with fluoroscopic observation. Once the retriever was entirely removed from the system, free aspiration was confirmed at the hub of the aspiration catheter, with free blood return confirmed. The balloon was then deflated, rotating hemostatic valve was reattached, and a control angiogram was performed, confirming restoration of flow. Penumbra catheter was then withdrawn, and the angiogram was performed at the carotid bifurcation. Given the appearance, we elected to place acute carotid stent. The lesion was then again navigated with synchro soft wire and rapid transit. With the microwire microcatheter positioned in the distal cervical carotid, the microwire was removed. The exchange length  Transcend wire was then replaced and the microcatheter was removed. Acute carotid stent was then deployed, 8 mm-6 mm x 40 mm length. Before deployment, we initiated Integrilin drip treatment for dual anti-platelet therapy. Angiogram was performed. Coaxial system of the penumbra aspiration catheter and the pro view 18 catheter were then navigated to the carotid siphon for better angiogram of the cerebral vasculature. Angiogram was performed. Microcatheter, a number aspiration catheter were withdrawn. The balloon guide catheter was positioned in the common carotid artery for final angiogram. All catheter and wires were removed. The sheath at the right common femoral artery access was withdrawn for an angiogram. Eight French Angio-Seal was then deployed for hemostasis. A flat panel CT was then performed.  Patient was then extubated. Estimated blood loss was 50 cc No complications encountered. IMPRESSION: Status post left-sided cerebral angiogram with treatment of left ICA/MCA tandem occlusion, requiring acute left carotid stenting and mechanical thrombectomy of a left M1 occlusion with achievement of mTICI-3 flow, and restoration of cervical ICA flow. Flat panel CT in the room demonstrates small contrast stain without complicating features, and the patient was extubated. Status post deployment of 8 French Angio-Seal for right common femoral artery hemostasis. Status post ultrasound-guided left common femoral artery access with a 4 French sheath for arterial monitoring, which remained after the case. Status post ultrasound-guided right common femoral vein access for additional IV access, which remained after the case transmitting the Integrilin drip. Signed, Dulcy Fanny. Earleen Newport, DO [facility] Radiology PLAN: Patient will go to the PACU ICU admission Bilateral hips will be straight until the left 4 French arterial monitoring sheath is removed and the right common femoral vein  venous access sheath is removed. Routine wound management for the right common femoral artery sheath access. Frequent neurovascular checks. IV saline hydration for 8 hours given the renal insufficiency Blood pressure control management of 120-140 with nicardipine drip ordered The goal will be dual anti-platelet therapy, for now with dose of 650 mg aspirin and the Integrilin drip. Electronically Signed   By: Corrie Mckusick D.O.   On: 05/06/2017 09:24   Ct Head Code Stroke Wo Contrast  Result Date: 05/06/2017 CLINICAL DATA:  Code stroke. 77 y/o M; right-sided weakness and numbness with slurred speech. EXAM: CT HEAD WITHOUT CONTRAST TECHNIQUE: Contiguous axial images were obtained from the base of the skull through the vertex without intravenous contrast. COMPARISON:  11/19/2016 MRI of the head. FINDINGS: Brain: No evidence of acute infarction, hemorrhage, hydrocephalus, extra-axial collection or mass lesion/mass effect. Stable moderate chronic microvascular ischemic changes and parenchymal volume loss of the brain. Vascular: Calcific atherosclerosis of carotid siphons. Dense left M1 (series 5, image 30). Skull: Normal. Negative for fracture or focal lesion. Sinuses/Orbits: No acute finding. Other: None. ASPECTS [facility] Stroke Program Early CT Score) -  Ganglionic level infarction (caudate, lentiform nuclei, internal capsule, insula, M1-M3 cortex): 7 - Supraganglionic infarction (M4-M6 cortex): 3 Total score (0-10 with 10 being normal): 10 IMPRESSION: 1. No acute stroke, hemorrhage, or mass effect identified. 2. Dense left M1, possible thrombus. 3. ASPECTS is 10 4. Stable chronic microvascular ischemic changes and parenchymal volume loss of the brain given differences in technique. These results were communicated to Dr. Cheral Marker at 3:41 amon 2/28/2019by text page via the [facility] messaging system. Electronically Signed   By: Kristine Garbe M.D.   On: 05/06/2017 03:43    Labs:  CBC: Recent Labs     03/25/17 0952 05/06/17 0312 05/06/17 0329 05/07/17 0500 05/08/17 0615 05/08/17 1444  WBC 4.6 8.0  --  8.3 6.9  --   HGB 11.7* 12.6* 13.3 10.7* 11.2* 11.8*  HCT 35.3* 38.4* 39.0 33.4* 34.9* 36.5*  PLT 194 185  --  176 164  --     COAGS: Recent Labs    10/30/16 1145 05/06/17 0312  INR 0.92 0.92  APTT 32 26    BMP: Recent Labs    03/25/17 0952 05/06/17 0312 05/06/17 0329 05/07/17 0500 05/08/17 0615  NA 135 135 137 139 139  K 5.3* 4.3 4.3 4.3 4.4  CL 106 101 103 109 110  CO2 22 22  --  22 20*  GLUCOSE 226* 383* 366* 203* 173*  BUN 26* 40* 37* 30* 33*  CALCIUM 8.8* 9.1  --  8.2* 8.4*  CREATININE 1.94* 1.98* 1.80* 1.88* 1.89*  GFRNONAA 32* 31*  --  33* 33*  GFRAA 37* 36*  --  38* 38*    LIVER FUNCTION TESTS: Recent Labs    02/10/17 0847 03/18/17 1512 03/25/17 0952 05/06/17 0312  BILITOT 0.3 0.5 0.5 0.4  AST 18 20 21 16   ALT 13* 10* 10* 13*  ALKPHOS 88 87 83 100  PROT 7.6 8.1 7.2 6.6  ALBUMIN 3.6 4.1 3.7 3.1*    Assessment and Plan: CVA, s/p left-sided cerebral angiogram with treatment of left ICA/MCA tandem occlusion, requiring acute left carotid stenting and mechanical thrombectomy of a left M1 occlusion with achievement of mTICI-3 flow, and restoration of cervical ICA flow 2/28; afebrile; creat 1.89, WBC nl; hgb stable; CXR stable; on plavix/ASA; plans as per neuro   Electronically Signed: D. Rowe Robert, PA-C 05/08/2017, 3:37 PM   I spent a total of  at the the patient's bedside AND on the patient's hospital floor or unit, greater than 50% of which was counseling/coordinating care for cerebral arteriogram with endovascular intervention    Patient ID: Edwin Jones, male   DOB: [birth year], 77 y.o.   MRN: [id number]

## 2017-05-08 NOTE — Progress Notes (Deleted)
This note also relates to the following rows which could not be included: BP - Cannot attach notes to unvalidated device data MAP (mmHg) - Cannot attach notes to unvalidated device data Pulse Rate - Cannot attach notes to unvalidated device data ECG Heart Rate - Cannot attach notes to unvalidated device data Resp - Cannot attach notes to unvalidated device data SpO2 - Cannot attach notes to unvalidated device data

## 2017-05-08 NOTE — Progress Notes (Addendum)
Lab called critical venous lactic acid level of 2.5. Text paged to ordering MD, Binnie Rail. Received order to give one additional liter NS over 2 hrs then recheck Lactic acid in 3 hours.

## 2017-05-08 NOTE — Progress Notes (Signed)
STROKE TEAM PROGRESS NOTE   SUBJECTIVE (INTERVAL HISTORY) His wife is at the bedside.  Overall he feels his condition is stable. His blood pressure has been running low in the 82-95 systolic range after he tapered of the neo drip  despite being put on midodrine. And getting Albumin 4 doses and normal saline boluses   OBJECTIVE Temp:  [98.5 F (36.9 C)-99.8 F (37.7 C)] 98.5 F (36.9 C) (03/02 1200) Pulse Rate:  [55-88] 67 (03/02 1230) Resp:  [13-27] 19 (03/02 1230) BP: (70-159)/(39-101) 105/53 (03/02 1230) SpO2:  [92 %-100 %] 97 % (03/02 1230)  Recent Labs  Lab 05/07/17 1151 05/07/17 1701 05/07/17 2117 05/08/17 0751 05/08/17 1158  GLUCAP 176* 151* 118* 168* 185*   Recent Labs  Lab 05/06/17 0312 05/06/17 0329 05/07/17 0500 05/08/17 0615  NA 135 137 139 139  K 4.3 4.3 4.3 4.4  CL 101 103 109 110  CO2 22  --  22 20*  GLUCOSE 383* 366* 203* 173*  BUN 40* 37* 30* 33*  CREATININE 1.98* 1.80* 1.88* 1.89*  CALCIUM 9.1  --  8.2* 8.4*   Recent Labs  Lab 05/06/17 0312  AST 16  ALT 13*  ALKPHOS 100  BILITOT 0.4  PROT 6.6  ALBUMIN 3.1*   Recent Labs  Lab 05/06/17 0312 05/06/17 0329 05/07/17 0500 05/08/17 0615  WBC 8.0  --  8.3 6.9  NEUTROABS 6.3  --   --   --   HGB 12.6* 13.3 10.7* 11.2*  HCT 38.4* 39.0 33.4* 34.9*  MCV 95.5  --  96.3 95.9  PLT 185  --  176 164   No results for input(s): CKTOTAL, CKMB, CKMBINDEX, TROPONINI in the last 168 hours. Recent Labs    05/06/17 0312  LABPROT 12.3  INR 0.92   No results for input(s): COLORURINE, LABSPEC, PHURINE, GLUCOSEU, HGBUR, BILIRUBINUR, KETONESUR, PROTEINUR, UROBILINOGEN, NITRITE, LEUKOCYTESUR in the last 72 hours.  Invalid input(s): APPERANCEUR     Component Value Date/Time   CHOL 173 05/07/2017 0500   TRIG 215 (H) 05/07/2017 0500   HDL 61 05/07/2017 0500   CHOLHDL 2.8 05/07/2017 0500   VLDL 43 (H) 05/07/2017 0500   LDLCALC 69 05/07/2017 0500   Lab Results  Component Value Date   HGBA1C 9.1 (H)  05/07/2017      Component Value Date/Time   LABOPIA NONE DETECTED 05/06/2017 1011   COCAINSCRNUR NONE DETECTED 05/06/2017 1011   LABBENZ NONE DETECTED 05/06/2017 1011   AMPHETMU NONE DETECTED 05/06/2017 1011   THCU NONE DETECTED 05/06/2017 1011   LABBARB NONE DETECTED 05/06/2017 1011    No results for input(s): ETH in the last 168 hours.  I have personally reviewed the radiological images below and agree with the radiology interpretations.  Ct Angio Head W Or Wo Contrast  Result Date: 05/06/2017 CLINICAL DATA:  77 y/o M; right-sided weakness, numbness, slurred speech. EXAM: CT ANGIOGRAPHY HEAD AND NECK TECHNIQUE: Multidetector CT imaging of the head and neck was performed using the standard protocol during bolus administration of intravenous contrast. Multiplanar CT image reconstructions and MIPs were obtained to evaluate the vascular anatomy. Carotid stenosis measurements (when applicable) are obtained utilizing NASCET criteria, using the distal internal carotid diameter as the denominator. CONTRAST:  40mL ISOVUE-370 IOPAMIDOL (ISOVUE-370) INJECTION 76% COMPARISON:  None. FINDINGS: CTA NECK FINDINGS Aortic arch: Standard branching. Imaged portion shows no evidence of aneurysm or dissection. No significant stenosis of the major arch vessel origins. Right carotid system: No evidence of dissection, stenosis (50% or greater)  or occlusion. Left carotid system: Patent left common carotid artery. Left ICA occlusion to the terminus where there is a short segment of reconstitution of the terminal ICA and proximal left M1 via the anterior communicating artery. Vertebral arteries: Codominant. No evidence of dissection, stenosis (50% or greater) or occlusion. Skeleton: Moderate cervical spine spondylosis with multilevel discogenic degenerative changes greatest at the C4 through C7 levels or uncovertebral and facet hypertrophy encroach on neural foramen and there is mild canal stenosis. Other neck: Negative.  Upper chest: Right port catheter in the SVC extending below field of view. Review of the MIP images confirms the above findings CTA HEAD FINDINGS Anterior circulation: Left mid M1 occlusion with poor left MCA distribution collateral circulation. Right distal M1 mild stenosis. No additional large vessel occlusion, aneurysm, or vascular malformation in the anterior circulation. Posterior circulation: No significant stenosis, proximal occlusion, aneurysm, or vascular malformation. Right P2 moderate stenosis and left P2 mild stenosis. Venous sinuses: As permitted by contrast timing, patent. Anatomic variants: Patent anterior and bilateral posterior communicating arteries. Review of the MIP images confirms the above findings IMPRESSION: 1. 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 Short segment of patency of left ICA terminus and proximal left M1 via anterior communicating artery. 2. Left mid M1 occlusion with poor left MCA collateralization. 3. Patent right carotid and bilateral vertebral systems. 4. Patent bilateral ACA, right MCA, and bilateral PCA distributions. Intracranial atherosclerosis with multiple segments of mild-to-moderate stenosis. These results were called by telephone at the time of interpretation on 05/06/2017 at 4:10 am to Dr. Cheral Marker, who verbally acknowledged these results. Electronically Signed   By: Kristine Garbe M.D.   On: 05/06/2017 04:17   Ct Angio Neck W And/or Wo Contrast  Result Date: 05/06/2017 CLINICAL DATA:  77 y/o M; right-sided weakness, numbness, slurred speech. EXAM: CT ANGIOGRAPHY HEAD AND NECK TECHNIQUE: Multidetector CT imaging of the head and neck was performed using the standard protocol during bolus administration of intravenous contrast. Multiplanar CT image reconstructions and MIPs were obtained to evaluate the vascular anatomy. Carotid stenosis measurements (when applicable) are obtained utilizing NASCET criteria, using the distal internal carotid diameter as  the denominator. CONTRAST:  79mL ISOVUE-370 IOPAMIDOL (ISOVUE-370) INJECTION 76% COMPARISON:  None. FINDINGS: CTA NECK FINDINGS Aortic arch: Standard branching. Imaged portion shows no evidence of aneurysm or dissection. No significant stenosis of the major arch vessel origins. Right carotid system: No evidence of dissection, stenosis (50% or greater) or occlusion. Left carotid system: Patent left common carotid artery. Left ICA occlusion to the terminus where there is a short segment of reconstitution of the terminal ICA and proximal left M1 via the anterior communicating artery. Vertebral arteries: Codominant. No evidence of dissection, stenosis (50% or greater) or occlusion. Skeleton: Moderate cervical spine spondylosis with multilevel discogenic degenerative changes greatest at the C4 through C7 levels or uncovertebral and facet hypertrophy encroach on neural foramen and there is mild canal stenosis. Other neck: Negative. Upper chest: Right port catheter in the SVC extending below field of view. Review of the MIP images confirms the above findings CTA HEAD FINDINGS Anterior circulation: Left mid M1 occlusion with poor left MCA distribution collateral circulation. Right distal M1 mild stenosis. No additional large vessel occlusion, aneurysm, or vascular malformation in the anterior circulation. Posterior circulation: No significant stenosis, proximal occlusion, aneurysm, or vascular malformation. Right P2 moderate stenosis and left P2 mild stenosis. Venous sinuses: As permitted by contrast timing, patent. Anatomic variants: Patent anterior and bilateral posterior communicating arteries. Review of the  MIP images confirms the above findings IMPRESSION: 1. 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 Short segment of patency of left ICA terminus and proximal left M1 via anterior communicating artery. 2. Left mid M1 occlusion with poor left MCA collateralization. 3. Patent right carotid and bilateral vertebral  systems. 4. Patent bilateral ACA, right MCA, and bilateral PCA distributions. Intracranial atherosclerosis with multiple segments of mild-to-moderate stenosis. These results were called by telephone at the time of interpretation on 05/06/2017 at 4:10 am to Dr. Cheral Marker, who verbally acknowledged these results. Electronically Signed   By: Kristine Garbe M.D.   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 Personal history of prior whole brain radiation for cancer, but no known brain metastases. EXAM: MRI HEAD WITHOUT CONTRAST MRA HEAD WITHOUT CONTRAST TECHNIQUE: Multiplanar, multiecho pulse sequences of the brain and surrounding structures were obtained without intravenous contrast. Angiographic images of the head were obtained using MRA technique without contrast. COMPARISON:  Cerebral angiogram, CTA head and neck, and noncontrast head CT earlier today. Prior brain MRI 11/19/2016. FINDINGS: MRI HEAD FINDINGS Brain: Restricted diffusion in the left basal ganglia (series 3, image 32) with T2 and FLAIR hyperintensity. No associated hemorrhage. No significant mass effect. There is a small 9 millimeter area of restricted diffusion in the medial left temporal lobe at the amygdala (series 3, image 20). There are otherwise a small number of tiny scattered foci of restricted diffusion in the left MCA territory (such as series 3, image 40). There is no right hemisphere or posterior circulation restricted diffusion. Patchy and scattered bilateral cerebral white matter T2 and FLAIR hyperintensity elsewhere is chronic and stable. The right deep gray matter nuclei, left thalamus, brainstem, and cerebellum remain normal for age. No midline shift, mass effect, evidence of mass lesion, ventriculomegaly, extra-axial collection or acute  intracranial hemorrhage. Cervicomedullary junction and pituitary are within normal limits. Vascular: Major intracranial vascular flow voids are preserved, the left ICA flow void was abnormal in 2018 and appears mildly improved today. Negative visible cervical spine.  Normal bone marrow signal. There is mild leftward gaze deviation. Orbits soft tissues otherwise appear normal. Paranasal Visualized paranasal sinuses and mastoids are stable and well pneumatized. Scalp and face soft tissues appear negative. MRA HEAD FINDINGS Antegrade flow in the posterior circulation with codominant distal vertebral arteries. Mild distal vertebral irregularity with no stenosis. Patent vertebrobasilar junction without stenosis. Dominant appearing AICA origins are patent. Mild basilar artery irregularity without stenosis. SCA origins are patent. Bilateral PCA origins appear normal. Posterior communicating arteries are diminutive or absent. Bilateral PCA branches are mildly irregular, and there is mild right PCA P2 segment stenosis which is stable from earlier today. Antegrade flow in both ICA siphons. Mild luminal narrowing and irregularity throughout the visible left ICA may reflect residual thrombus or plaque. The left ICA is patent to the left terminus. The left MCA and ACA origins are patent. There is mild to moderate irregularity at the left ACA origin and proximal A1 segment which is stable from earlier today. There is mild irregularity scratched at the left MCA and its branches now are patent. There is mild left M1 irregularity. The left MCA bifurcation is patent with no branch occlusion identified. Antegrade flow in the right ICA siphon which is only mildly irregular and without stenosis. Normal right ophthalmic artery origin. Normal right ICA  terminus, right ACA and MCA origins. The anterior communicating artery is normal. The visible ACA branches are stable, with mild irregularity of the distal left ACA. The right MCA M1  segment is patent with mild irregularity just proximal to the bifurcation which is stable. The right MCA branches are stable and within normal limits. IMPRESSION: 1. Restored patency of the Left ICA, terminus, and Left MCA since earlier today. Mild residual irregularity throughout the left ICA siphon, and in the left M1 segment. Stable mild to moderate irregularity in the left A1 and distal A2 segments. 2. Associated core infarct primarily confined to the Left Basal Ganglia. There is a small 9 mm infarct in the left amygdala, and there are small number of scattered tiny cortical infarcts elsewhere in the Left MCA territory. 3. Mild cytotoxic edema with no associated hemorrhage or mass effect. 4. 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 Electronically Signed   By: Genevie Ann M.D.   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 Personal history of prior whole brain radiation for cancer, but no known brain metastases. EXAM: MRI HEAD WITHOUT CONTRAST MRA HEAD WITHOUT CONTRAST TECHNIQUE: Multiplanar, multiecho pulse sequences of the brain and surrounding structures were obtained without intravenous contrast. Angiographic images of the head were obtained using MRA technique without contrast. COMPARISON:  Cerebral angiogram, CTA head and neck, and noncontrast head CT earlier today. Prior brain MRI 11/19/2016. FINDINGS: MRI HEAD FINDINGS Brain: Restricted diffusion in the left basal ganglia (series 3, image 32) with T2 and FLAIR hyperintensity. No associated hemorrhage. No significant mass effect. There is a small 9 millimeter area of restricted diffusion in the medial left temporal lobe at the amygdala (series 3, image 20). There are otherwise a small number of tiny scattered foci of restricted  diffusion in the left MCA territory (such as series 3, image 40). There is no right hemisphere or posterior circulation restricted diffusion. Patchy and scattered bilateral cerebral white matter T2 and FLAIR hyperintensity elsewhere is chronic and stable. The right deep gray matter nuclei, left thalamus, brainstem, and cerebellum remain normal for age. No midline shift, mass effect, evidence of mass lesion, ventriculomegaly, extra-axial collection or acute intracranial hemorrhage. Cervicomedullary junction and pituitary are within normal limits. Vascular: Major intracranial vascular flow voids are preserved, the left ICA flow void was abnormal in 2018 and appears mildly improved today. Negative visible cervical spine.  Normal bone marrow signal. There is mild leftward gaze deviation. Orbits soft tissues otherwise appear normal. Paranasal Visualized paranasal sinuses and mastoids are stable and well pneumatized. Scalp and face soft tissues appear negative. MRA HEAD FINDINGS Antegrade flow in the posterior circulation with codominant distal vertebral arteries. Mild distal vertebral irregularity with no stenosis. Patent vertebrobasilar junction without stenosis. Dominant appearing AICA origins are patent. Mild basilar artery irregularity without stenosis. SCA origins are patent. Bilateral PCA origins appear normal. Posterior communicating arteries are diminutive or absent. Bilateral PCA branches are mildly irregular, and there is mild right PCA P2 segment stenosis which is stable from earlier today. Antegrade flow in both ICA siphons. Mild luminal narrowing and irregularity throughout the visible left ICA may reflect residual thrombus or plaque. The left ICA is patent to the left terminus. The left MCA and ACA origins are patent. There is mild to moderate irregularity at the left  ACA origin and proximal A1 segment which is stable from earlier today. There is mild irregularity scratched at the left MCA and its branches  now are patent. There is mild left M1 irregularity. The left MCA bifurcation is patent with no branch occlusion identified. Antegrade flow in the right ICA siphon which is only mildly irregular and without stenosis. Normal right ophthalmic artery origin. Normal right ICA terminus, right ACA and MCA origins. The anterior communicating artery is normal. The visible ACA branches are stable, with mild irregularity of the distal left ACA. The right MCA M1 segment is patent with mild irregularity just proximal to the bifurcation which is stable. The right MCA branches are stable and within normal limits. IMPRESSION: 1. Restored patency of the Left ICA, terminus, and Left MCA since earlier today. Mild residual irregularity throughout the left ICA siphon, and in the left M1 segment. Stable mild to moderate irregularity in the left A1 and distal A2 segments. 2. Associated core infarct primarily confined to the Left Basal Ganglia. There is a small 9 mm infarct in the left amygdala, and there are small number of scattered tiny cortical infarcts elsewhere in the Left MCA territory. 3. Mild cytotoxic edema with no associated hemorrhage or mass effect. 4. 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 Electronically Signed   By: Genevie Ann M.D.   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 The antibiotic was administered within 1 hour of the procedure 75 MCG NITROGLYCERIN INTRA ARTERIAL 650 mg aspirin via orogastric tube. Intra procedural initiation of Integrilin IV drip for dual anti-platelet therapy. ANESTHESIA/SEDATION: General endotracheal tube anesthesia with anesthesia team CONTRAST:  144 cc IV contrast FLUOROSCOPY TIME:  Fluoroscopy Time: 54 minutes 6 seconds (2,926 mGy). COMPLICATIONS: None TECHNIQUE: Informed written consent was obtained from the patient's family after a thorough discussion of the procedural risks, benefits and alternatives. Specific risks discussed include: Bleeding, infection, contrast reaction, kidney injury/failure, need for further procedure/surgery, arterial injury or dissection, embolization to new territory, intracranial hemorrhage (10-15% risk), neurologic deterioration, cardiopulmonary collapse, death. All questions were addressed. Maximal Sterile Barrier Technique was utilized including during the procedure including caps, mask, sterile gowns, sterile gloves, sterile drape, hand hygiene and skin antiseptic. A timeout was performed prior to the initiation of the procedure. FINDINGS: Initial angiogram: Left common carotid artery:  Normal course caliber and contour. Left external carotid artery: Patent with antegrade flow. On the initial injection before treatment, there is minimal opacification of the carotid siphon secondary to meningeal branches which appear to be region ating from ascending pharyngeal distribution and potentially deep meningeal branches from the external carotid artery. No significant filling of the MCA on the initial injection The initial injection does confirmed perfusion of the  lenticulostriate vasculature, as well as a patent anterior coronal artery, with minimal filling of temporal lobe. Anterior basal ganglia structures are perfused. Left internal carotid artery: Internal carotid artery is occluded at the origin with a string sign at the posterior aspect of the artery. Left MCA: Initial injection demonstrates no significant filling of the middle cerebral artery. 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 Left ACA: Anterior cerebral artery perfused by the right-sided flow at the initiation. Final angiogram: After treatment of the carotid occlusion and deployment of 8 mm-6 mm x 40 mm carotid stent, there is excellent flow through the cervical carotid to the intracranial carotid. After mechanical thrombectomy of M1 occlusion, there is modified treatment in cerebral ischemia of 3. Flat panel CT: No complications with small contrast stain of the left basal ganglia. PROCEDURE: Patient was brought to the neurointerventional suite, with the patient identity confirmed, and consent form confirmed. General anesthesia was present for initiation of general endotracheal tube anesthesia. Patient was prepped and draped in the usual sterile fashion. The only venous access was a port catheter with single needle access. Ultrasound survey of the right inguinal region was performed with images stored and sent to PACs. 11 blade scalpel was used to make a small incision. A micropuncture needle was used access the right common femoral artery under ultrasound. With excellent arterial blood flow returned, an .018 micro wire was passed through the needle, observed to enter the abdominal aorta under fluoroscopy. The needle was removed, and a micropuncture sheath was placed over the wire. The inner dilator and wire were removed, and an 035 Bentson wire was advanced under fluoroscopy into the abdominal aorta. The sheath was removed and a standard 5 [place]  vascular sheath was placed. The dilator was removed and the sheath was flushed. After the initial common femoral artery access, ultrasound survey of the left inguinal region was performed in order to place arterial monitoring line. Images stored and sent to PACs. Micropuncture needle was used access the left common femoral artery under ultrasound. With excellent arterial blood flow returned, an .018 micro wire was passed through the needle, observed to enter the abdominal aorta under fluoroscopy. The needle was removed, and a micropuncture sheath was placed over the wire. The inner dilator and wire were removed, and an 035 Bentson wire was advanced under fluoroscopy into the abdominal aorta. The sheath was removed and a standard 4 [place] vascular sheath was placed. The dilator was removed and the sheath was flushed. A 76F JB-1 diagnostic catheter was advanced over the wire through the right common femoral artery access to the proximal descending thoracic aorta. Wire was then removed. Double flush of the catheter was performed. Catheter was then used to select the left common carotid artery. Angiogram was performed. Using roadmap technique, the catheter was advanced over a roadrunner wire into right common carotid artery. Formal angiogram was performed. Catheter was advanced over the roadrunner wire into the external carotid artery. Wire was removed. Exchange length Rosen wire was then passed through the diagnostic catheter to the distal common carotid artery and the diagnostic catheter was removed. The 5 French sheath was removed and exchanged for 8 French 55 centimeter BrightTip sheath. Sheath was flushed and attached to pressurized and heparinized saline bag for constant forward flow. Then an 8 [place], 95 cm Flowgate balloon tip catheter was prepared on the back table with inflation of the balloon with 50/50 concentration of dilute contrast. The balloon catheter  was then advanced over the wire, positioned into the  distal common carotid artery. Copious back flush was performed and the balloon catheter was attached to heparinized and pressurized saline bag for forward flow. Roadmap was then performed at the carotid bifurcation. Combination of a rapid transit microwire and a synchro soft wire were used to navigate beyond the occlusion at the proximal internal carotid artery. The microcatheter was then navigated into the distal cervical segment, wire was removed, blood was aspirated, and a gentle injection confirmed a luminal position. Exchange length Transcend wire was then placed through the microcatheter into the cervical carotid. Microcatheter was removed. Balloon angioplasty was then performed with a rapid exchange system, using a Viatrac balloon 5 mm x 30 mm. The patient required treatment at this time with atropine dose, as we experienced bradycardia into the 30s. His heart rate recovered after treatment by the anesthesia team. Balloon was removed. Angiogram confirmed flow into the cervical carotid. A coaxial system of an Ace 64 penumbra aspiration catheter and the Trevo pro view 18 catheter was then advanced over the exchange length wire into the cervical carotid. Once the coaxial system was in the cervical carotid, the microwire was removed and exchanged for synchro soft. The synchro soft wire was navigated beyond the occlusion under roadmap guidance, and the aspiration catheter was advanced into the ICA terminus. The balloon guide catheter was then advanced into the mid segment of the cervical carotid artery. The microcatheter and microwire were then carefully advanced through the occluded segment. Microcatheter was then push through the occluded segment and the wire was removed. Blood was then aspirated through the hub of the microcatheter, and a gentle contrast injection was performed confirming intraluminal position. A rotating hemostatic valve was then attached to the back end of the microcatheter, and a pressurized  and heparinized saline bag was attached to the catheter. 4 x 40 solitaire device was then selected. Back flush was achieved at the rotating hemostatic valve, and then the device was gently advanced through the microcatheter to the distal end. The retriever was then unsheathed by withdrawing the microcatheter under fluoroscopy. A 5 minutes interval was observed. At this time, ultrasound-guided access of the right common femoral vein was performed for placement of a additional venous access and placement of a 12 French triple-lumen IV catheter. We then proceeded with the mechanical thrombectomy. The balloon at the balloon tip catheter was then inflated under fluoroscopy for proximal flow arrest. Constant aspiration was then performed at the penumbra aspiration catheter at the carotid terminus as the retriever was gently and slowly withdrawn with fluoroscopic observation. Once the retriever was entirely removed from the system, free aspiration was confirmed at the hub of the aspiration catheter, with free blood return confirmed. The balloon was then deflated, rotating hemostatic valve was reattached, and a control angiogram was performed, confirming restoration of flow. Penumbra catheter was then withdrawn, and the angiogram was performed at the carotid bifurcation. Given the appearance, we elected to place acute carotid stent. The lesion was then again navigated with synchro soft wire and rapid transit. With the microwire microcatheter positioned in the distal cervical carotid, the microwire was removed. The exchange length Transcend wire was then replaced and the microcatheter was removed. Acute carotid stent was then deployed, 8 mm-6 mm x 40 mm length. Before deployment, we initiated Integrilin drip treatment for dual anti-platelet therapy. Angiogram was performed. Coaxial system of the penumbra aspiration catheter and the pro view 18 catheter were then navigated to the carotid  siphon for better angiogram of the  cerebral vasculature. Angiogram was performed. Microcatheter, a number aspiration catheter were withdrawn. The balloon guide catheter was positioned in the common carotid artery for final angiogram. All catheter and wires were removed. The sheath at the right common femoral artery access was withdrawn for an angiogram. Eight French Angio-Seal was then deployed for hemostasis. A flat panel CT was then performed.  Patient was then extubated. Estimated blood loss was 50 cc No complications encountered. IMPRESSION: Status post left-sided cerebral angiogram with treatment of left ICA/MCA tandem occlusion, requiring acute left carotid stenting and mechanical thrombectomy of a left M1 occlusion with achievement of mTICI-3 flow, and restoration of cervical ICA flow. Flat panel CT in the room demonstrates small contrast stain without complicating features, and the patient was extubated. Status post deployment of 8 French Angio-Seal for right common femoral artery hemostasis. Status post ultrasound-guided left common femoral artery access with a 4 French sheath for arterial monitoring, which remained after the case. Status post ultrasound-guided right common femoral vein access for additional IV access, which remained after the case transmitting the Integrilin drip. Signed, Dulcy Fanny. Earleen Newport, DO [facility] Radiology PLAN: Patient will go to the PACU ICU admission Bilateral hips will be straight until the left 4 French arterial monitoring sheath is removed and the right common femoral vein venous access sheath is removed. Routine wound management for the right common femoral artery sheath access. Frequent neurovascular checks. IV saline hydration for 8 hours given the renal insufficiency Blood pressure control management of 120-140 with nicardipine drip ordered The goal will be dual anti-platelet therapy, for now with dose of 650 mg aspirin and the Integrilin drip. Electronically  Signed   By: Corrie Mckusick D.O.   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 The antibiotic was administered within 1 hour of the procedure 75 MCG NITROGLYCERIN INTRA ARTERIAL 650 mg aspirin via orogastric tube. Intra procedural initiation of Integrilin IV drip for dual anti-platelet therapy. ANESTHESIA/SEDATION: General endotracheal tube anesthesia with anesthesia team CONTRAST:  144 cc IV contrast FLUOROSCOPY TIME:  Fluoroscopy Time: 54 minutes 6 seconds (2,926 mGy). COMPLICATIONS: None TECHNIQUE: Informed written consent was obtained from the patient's family after a thorough discussion of the procedural risks, benefits and alternatives. Specific risks discussed include: Bleeding, infection, contrast reaction, kidney injury/failure, need for further procedure/surgery, arterial injury or dissection, embolization to new territory, intracranial hemorrhage (10-15% risk), neurologic deterioration, cardiopulmonary collapse, death. All questions were addressed. Maximal Sterile Barrier Technique was utilized including during the procedure including  caps, mask, sterile gowns, sterile gloves, sterile drape, hand hygiene and skin antiseptic. A timeout was performed prior to the initiation of the procedure. FINDINGS: Initial angiogram: Left common carotid artery:  Normal  course caliber and contour. Left external carotid artery: Patent with antegrade flow. On the initial injection before treatment, there is minimal opacification of the carotid siphon secondary to meningeal branches which appear to be region ating from ascending pharyngeal distribution and potentially deep meningeal branches from the external carotid artery. No significant filling of the MCA on the initial injection The initial injection does confirmed perfusion of the lenticulostriate vasculature, as well as a patent anterior coronal artery, with minimal filling of temporal lobe. Anterior basal ganglia structures are perfused. Left internal carotid artery: Internal carotid artery is occluded at the origin with a string sign at the posterior aspect of the artery. Left MCA: Initial injection demonstrates no significant filling of the middle cerebral artery. 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 Left ACA: Anterior cerebral artery perfused by the right-sided flow at the initiation. Final angiogram: After treatment of the carotid occlusion and deployment of 8 mm-6 mm x 40 mm carotid stent, there is excellent flow through the cervical carotid to the intracranial carotid. After mechanical thrombectomy of M1 occlusion, there is modified treatment in cerebral ischemia of 3. Flat panel CT: No complications with small contrast stain of the left basal ganglia. PROCEDURE: Patient was brought to the neurointerventional suite, with the patient identity confirmed, and consent form confirmed. General anesthesia was present for initiation of general endotracheal tube anesthesia. Patient was prepped and draped in the usual sterile fashion. The only  venous access was a port catheter with single needle access. Ultrasound survey of the right inguinal region was performed with images stored and sent to PACs. 11 blade scalpel was used to make a small incision. A micropuncture needle was used access the right common femoral artery under ultrasound. With excellent arterial blood flow returned, an .018 micro wire was passed through the needle, observed to enter the abdominal aorta under fluoroscopy. The needle was removed, and a micropuncture sheath was placed over the wire. The inner dilator and wire were removed, and an 035 Bentson wire was advanced under fluoroscopy into the abdominal aorta. The sheath was removed and a standard 5 [place] vascular sheath was placed. The dilator was removed and the sheath was flushed. After the initial common femoral artery access, ultrasound survey of the left inguinal region was performed in order to place arterial monitoring line. Images stored and sent to PACs. Micropuncture needle was used access the left common femoral artery under ultrasound. With excellent arterial blood flow returned, an .018 micro wire was passed through the needle, observed to enter the abdominal aorta under fluoroscopy. The needle was removed, and a micropuncture sheath was placed over the wire. The inner dilator and wire were removed, and an 035 Bentson wire was advanced under fluoroscopy into the abdominal aorta. The sheath was removed and a standard 4 [place] vascular sheath was placed. The dilator was removed and the sheath was flushed. A 55F JB-1 diagnostic catheter was advanced over the wire through the right common femoral artery access to the proximal descending thoracic aorta. Wire was then removed. Double flush of the catheter was performed. Catheter was then used to select the left common carotid artery. Angiogram was performed. Using roadmap technique, the catheter was advanced over a roadrunner wire into right common carotid artery. Formal  angiogram was performed. Catheter was advanced over the roadrunner wire into the external carotid artery. Wire was removed. Exchange length Rosen wire was then passed through the diagnostic catheter to the distal  common carotid artery and the diagnostic catheter was removed. The 5 French sheath was removed and exchanged for 8 French 55 centimeter BrightTip sheath. Sheath was flushed and attached to pressurized and heparinized saline bag for constant forward flow. Then an 8 [place], 95 cm Flowgate balloon tip catheter was prepared on the back table with inflation of the balloon with 50/50 concentration of dilute contrast. The balloon catheter was then advanced over the wire, positioned into the distal common carotid artery. Copious back flush was performed and the balloon catheter was attached to heparinized and pressurized saline bag for forward flow. Roadmap was then performed at the carotid bifurcation. Combination of a rapid transit microwire and a synchro soft wire were used to navigate beyond the occlusion at the proximal internal carotid artery. The microcatheter was then navigated into the distal cervical segment, wire was removed, blood was aspirated, and a gentle injection confirmed a luminal position. Exchange length Transcend wire was then placed through the microcatheter into the cervical carotid. Microcatheter was removed. Balloon angioplasty was then performed with a rapid exchange system, using a Viatrac balloon 5 mm x 30 mm. The patient required treatment at this time with atropine dose, as we experienced bradycardia into the 30s. His heart rate recovered after treatment by the anesthesia team. Balloon was removed. Angiogram confirmed flow into the cervical carotid. A coaxial system of an Ace 64 penumbra aspiration catheter and the Trevo pro view 18 catheter was then advanced over the exchange length wire into the cervical carotid. Once the coaxial system was in the cervical carotid, the microwire was  removed and exchanged for synchro soft. The synchro soft wire was navigated beyond the occlusion under roadmap guidance, and the aspiration catheter was advanced into the ICA terminus. The balloon guide catheter was then advanced into the mid segment of the cervical carotid artery. The microcatheter and microwire were then carefully advanced through the occluded segment. Microcatheter was then push through the occluded segment and the wire was removed. Blood was then aspirated through the hub of the microcatheter, and a gentle contrast injection was performed confirming intraluminal position. A rotating hemostatic valve was then attached to the back end of the microcatheter, and a pressurized and heparinized saline bag was attached to the catheter. 4 x 40 solitaire device was then selected. Back flush was achieved at the rotating hemostatic valve, and then the device was gently advanced through the microcatheter to the distal end. The retriever was then unsheathed by withdrawing the microcatheter under fluoroscopy. A 5 minutes interval was observed. At this time, ultrasound-guided access of the right common femoral vein was performed for placement of a additional venous access and placement of a 12 French triple-lumen IV catheter. We then proceeded with the mechanical thrombectomy. The balloon at the balloon tip catheter was then inflated under fluoroscopy for proximal flow arrest. Constant aspiration was then performed at the penumbra aspiration catheter at the carotid terminus as the retriever was gently and slowly withdrawn with fluoroscopic observation. Once the retriever was entirely removed from the system, free aspiration was confirmed at the hub of the aspiration catheter, with free blood return confirmed. The balloon was then deflated, rotating hemostatic valve was reattached, and a control angiogram was performed, confirming restoration of flow. Penumbra catheter was then withdrawn, and the angiogram was  performed at the carotid bifurcation. Given the appearance, we elected to place acute carotid stent. The lesion was then again navigated with synchro soft wire and rapid transit. With the microwire microcatheter  positioned in the distal cervical carotid, the microwire was removed. The exchange length Transcend wire was then replaced and the microcatheter was removed. Acute carotid stent was then deployed, 8 mm-6 mm x 40 mm length. Before deployment, we initiated Integrilin drip treatment for dual anti-platelet therapy. Angiogram was performed. Coaxial system of the penumbra aspiration catheter and the pro view 18 catheter were then navigated to the carotid siphon for better angiogram of the cerebral vasculature. Angiogram was performed. Microcatheter, a number aspiration catheter were withdrawn. The balloon guide catheter was positioned in the common carotid artery for final angiogram. All catheter and wires were removed. The sheath at the right common femoral artery access was withdrawn for an angiogram. Eight French Angio-Seal was then deployed for hemostasis. A flat panel CT was then performed.  Patient was then extubated. Estimated blood loss was 50 cc No complications encountered. IMPRESSION: Status post left-sided cerebral angiogram with treatment of left ICA/MCA tandem occlusion, requiring acute left carotid stenting and mechanical thrombectomy of a left M1 occlusion with achievement of mTICI-3 flow, and restoration of cervical ICA flow. Flat panel CT in the room demonstrates small contrast stain without complicating features, and the patient was extubated. Status post deployment of 8 French Angio-Seal for right common femoral artery hemostasis. Status post ultrasound-guided left common femoral artery access with a 4 French sheath for arterial monitoring, which remained after the case. Status post ultrasound-guided right common femoral vein access for additional IV access, which remained after the case  transmitting the Integrilin drip. Signed, Dulcy Fanny. Earleen Newport, DO [facility] Radiology PLAN: Patient will go to the PACU ICU admission Bilateral hips will be straight until the left 4 French arterial monitoring sheath is removed and the right common femoral vein venous access sheath is removed. Routine wound management for the right common femoral artery sheath access. Frequent neurovascular checks. IV saline hydration for 8 hours given the renal insufficiency Blood pressure control management of 120-140 with nicardipine drip ordered The goal will be dual anti-platelet therapy, for now with dose of 650 mg aspirin and the Integrilin drip. Electronically Signed   By: Corrie Mckusick D.O.   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 The antibiotic was administered within 1 hour of the procedure 75 MCG NITROGLYCERIN INTRA ARTERIAL 650 mg aspirin via orogastric tube. Intra procedural initiation of Integrilin IV drip for dual anti-platelet therapy. ANESTHESIA/SEDATION: General endotracheal tube anesthesia with anesthesia team  CONTRAST:  144 cc IV contrast FLUOROSCOPY TIME:  Fluoroscopy Time: 54 minutes 6 seconds (2,926 mGy). COMPLICATIONS: None TECHNIQUE: Informed written consent was obtained from the patient's family after a thorough discussion of the procedural risks, benefits and alternatives. Specific risks discussed include: Bleeding, infection, contrast reaction, kidney injury/failure, need for further procedure/surgery,  arterial injury or dissection, embolization to new territory, intracranial hemorrhage (10-15% risk), neurologic deterioration, cardiopulmonary collapse, death. All questions were addressed. Maximal Sterile Barrier Technique was utilized including during the procedure including caps, mask, sterile gowns, sterile gloves, sterile drape, hand hygiene and skin antiseptic. A timeout was performed prior to the initiation of the procedure. FINDINGS: Initial angiogram: Left common carotid artery:  Normal course caliber and contour. Left external carotid artery: Patent with antegrade flow. On the initial injection before treatment, there is minimal opacification of the carotid siphon secondary to meningeal branches which appear to be region ating from ascending pharyngeal distribution and potentially deep meningeal branches from the external carotid artery. No significant filling of the MCA on the initial injection The initial injection does confirmed perfusion of the lenticulostriate vasculature, as well as a patent anterior coronal artery, with minimal filling of temporal lobe. Anterior basal ganglia structures are perfused. Left internal carotid artery: Internal carotid artery is occluded at the origin with a string sign at the posterior aspect of the artery. Left MCA: Initial injection demonstrates no significant filling of the middle cerebral artery. 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 Left ACA: Anterior cerebral artery perfused by the right-sided flow  at the initiation. Final angiogram: After treatment of the carotid occlusion and deployment of 8 mm-6 mm x 40 mm carotid stent, there is excellent flow through the cervical carotid to the intracranial carotid. After mechanical thrombectomy of M1 occlusion, there is modified treatment in cerebral ischemia of 3. Flat panel CT: No complications with small contrast stain of the left basal ganglia. PROCEDURE: Patient was brought to the neurointerventional suite, with the patient identity confirmed, and consent form confirmed. General anesthesia was present for initiation of general endotracheal tube anesthesia. Patient was prepped and draped in the usual sterile fashion. The only venous access was a port catheter with single needle access. Ultrasound survey of the right inguinal region was performed with images stored and sent to PACs. 11 blade scalpel was used to make a small incision. A micropuncture needle was used access the right common femoral artery under ultrasound. With excellent arterial blood flow returned, an .018 micro wire was passed through the needle, observed to enter the abdominal aorta under fluoroscopy. The needle was removed, and a micropuncture sheath was placed over the wire. The inner dilator and wire were removed, and an 035 Bentson wire was advanced under fluoroscopy into the abdominal aorta. The sheath was removed and a standard 5 [place] vascular sheath was placed. The dilator was removed and the sheath was flushed. After the initial common femoral artery access, ultrasound survey of the left inguinal region was performed in order to place arterial monitoring line. Images stored and sent to PACs. Micropuncture needle was used access the left common femoral artery under ultrasound. With excellent arterial blood flow returned, an .018 micro wire was passed through the needle, observed to enter the abdominal aorta under fluoroscopy. The needle was removed, and a micropuncture sheath was placed  over the wire. The inner dilator and wire were removed, and an 035 Bentson wire was advanced under fluoroscopy into the abdominal aorta. The sheath was removed and a standard 4 [place] vascular sheath was placed. The dilator was removed and the sheath was flushed. A 49F JB-1 diagnostic catheter was advanced over the wire through the right common femoral artery access to the proximal descending thoracic aorta. Wire was then removed. Double flush of the catheter  was performed. Catheter was then used to select the left common carotid artery. Angiogram was performed. Using roadmap technique, the catheter was advanced over a roadrunner wire into right common carotid artery. Formal angiogram was performed. Catheter was advanced over the roadrunner wire into the external carotid artery. Wire was removed. Exchange length Rosen wire was then passed through the diagnostic catheter to the distal common carotid artery and the diagnostic catheter was removed. The 5 French sheath was removed and exchanged for 8 French 55 centimeter BrightTip sheath. Sheath was flushed and attached to pressurized and heparinized saline bag for constant forward flow. Then an 8 [place], 95 cm Flowgate balloon tip catheter was prepared on the back table with inflation of the balloon with 50/50 concentration of dilute contrast. The balloon catheter was then advanced over the wire, positioned into the distal common carotid artery. Copious back flush was performed and the balloon catheter was attached to heparinized and pressurized saline bag for forward flow. Roadmap was then performed at the carotid bifurcation. Combination of a rapid transit microwire and a synchro soft wire were used to navigate beyond the occlusion at the proximal internal carotid artery. The microcatheter was then navigated into the distal cervical segment, wire was removed, blood was aspirated, and a gentle injection confirmed a luminal position. Exchange length Transcend wire was  then placed through the microcatheter into the cervical carotid. Microcatheter was removed. Balloon angioplasty was then performed with a rapid exchange system, using a Viatrac balloon 5 mm x 30 mm. The patient required treatment at this time with atropine dose, as we experienced bradycardia into the 30s. His heart rate recovered after treatment by the anesthesia team. Balloon was removed. Angiogram confirmed flow into the cervical carotid. A coaxial system of an Ace 64 penumbra aspiration catheter and the Trevo pro view 18 catheter was then advanced over the exchange length wire into the cervical carotid. Once the coaxial system was in the cervical carotid, the microwire was removed and exchanged for synchro soft. The synchro soft wire was navigated beyond the occlusion under roadmap guidance, and the aspiration catheter was advanced into the ICA terminus. The balloon guide catheter was then advanced into the mid segment of the cervical carotid artery. The microcatheter and microwire were then carefully advanced through the occluded segment. Microcatheter was then push through the occluded segment and the wire was removed. Blood was then aspirated through the hub of the microcatheter, and a gentle contrast injection was performed confirming intraluminal position. A rotating hemostatic valve was then attached to the back end of the microcatheter, and a pressurized and heparinized saline bag was attached to the catheter. 4 x 40 solitaire device was then selected. Back flush was achieved at the rotating hemostatic valve, and then the device was gently advanced through the microcatheter to the distal end. The retriever was then unsheathed by withdrawing the microcatheter under fluoroscopy. A 5 minutes interval was observed. At this time, ultrasound-guided access of the right common femoral vein was performed for placement of a additional venous access and placement of a 12 French triple-lumen IV catheter. We then  proceeded with the mechanical thrombectomy. The balloon at the balloon tip catheter was then inflated under fluoroscopy for proximal flow arrest. Constant aspiration was then performed at the penumbra aspiration catheter at the carotid terminus as the retriever was gently and slowly withdrawn with fluoroscopic observation. Once the retriever was entirely removed from the system, free aspiration was confirmed at the hub of the aspiration catheter, with  free blood return confirmed. The balloon was then deflated, rotating hemostatic valve was reattached, and a control angiogram was performed, confirming restoration of flow. Penumbra catheter was then withdrawn, and the angiogram was performed at the carotid bifurcation. Given the appearance, we elected to place acute carotid stent. The lesion was then again navigated with synchro soft wire and rapid transit. With the microwire microcatheter positioned in the distal cervical carotid, the microwire was removed. The exchange length Transcend wire was then replaced and the microcatheter was removed. Acute carotid stent was then deployed, 8 mm-6 mm x 40 mm length. Before deployment, we initiated Integrilin drip treatment for dual anti-platelet therapy. Angiogram was performed. Coaxial system of the penumbra aspiration catheter and the pro view 18 catheter were then navigated to the carotid siphon for better angiogram of the cerebral vasculature. Angiogram was performed. Microcatheter, a number aspiration catheter were withdrawn. The balloon guide catheter was positioned in the common carotid artery for final angiogram. All catheter and wires were removed. The sheath at the right common femoral artery access was withdrawn for an angiogram. Eight French Angio-Seal was then deployed for hemostasis. A flat panel CT was then performed.  Patient was then extubated. Estimated blood loss was 50 cc No complications encountered. IMPRESSION: Status post left-sided cerebral angiogram  with treatment of left ICA/MCA tandem occlusion, requiring acute left carotid stenting and mechanical thrombectomy of a left M1 occlusion with achievement of mTICI-3 flow, and restoration of cervical ICA flow. Flat panel CT in the room demonstrates small contrast stain without complicating features, and the patient was extubated. Status post deployment of 8 French Angio-Seal for right common femoral artery hemostasis. Status post ultrasound-guided left common femoral artery access with a 4 French sheath for arterial monitoring, which remained after the case. Status post ultrasound-guided right common femoral vein access for additional IV access, which remained after the case transmitting the Integrilin drip. Signed, Dulcy Fanny. Earleen Newport, DO [facility] Radiology PLAN: Patient will go to the PACU ICU admission Bilateral hips will be straight until the left 4 French arterial monitoring sheath is removed and the right common femoral vein venous access sheath is removed. Routine wound management for the right common femoral artery sheath access. Frequent neurovascular checks. IV saline hydration for 8 hours given the renal insufficiency Blood pressure control management of 120-140 with nicardipine drip ordered The goal will be dual anti-platelet therapy, for now with dose of 650 mg aspirin and the Integrilin drip. Electronically Signed   By: Corrie Mckusick D.O.   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 The antibiotic was administered within 1 hour of the procedure 75 MCG NITROGLYCERIN INTRA ARTERIAL 650 mg aspirin via orogastric tube. Intra procedural initiation of Integrilin IV drip for  dual anti-platelet therapy. ANESTHESIA/SEDATION: General endotracheal tube anesthesia with anesthesia team CONTRAST:  144 cc IV contrast FLUOROSCOPY TIME:  Fluoroscopy Time: 54 minutes 6 seconds (2,926 mGy). COMPLICATIONS: None TECHNIQUE: Informed written consent was obtained from the patient's family after a thorough discussion of the procedural risks, benefits and alternatives. Specific risks discussed include: Bleeding, infection, contrast reaction, kidney injury/failure, need for further procedure/surgery, arterial injury or dissection, embolization to new territory, intracranial hemorrhage (10-15% risk), neurologic deterioration, cardiopulmonary collapse, death. All questions were addressed. Maximal Sterile Barrier Technique was utilized including during the procedure including caps, mask, sterile gowns, sterile gloves, sterile drape, hand hygiene and skin antiseptic. A timeout was performed prior to the initiation of the procedure. FINDINGS: Initial angiogram: Left common carotid artery:  Normal course caliber and contour. Left external carotid artery: Patent with antegrade flow. On the initial injection before treatment, there is minimal opacification of the carotid siphon secondary to meningeal branches which appear to be region ating from ascending pharyngeal distribution and potentially deep meningeal branches from the external carotid artery.  No significant filling of the MCA on the initial injection The initial injection does confirmed perfusion of the lenticulostriate vasculature, as well as a patent anterior coronal artery, with minimal filling of temporal lobe. Anterior basal ganglia structures are perfused. Left internal carotid artery: Internal carotid artery is occluded at the origin with a string sign at the posterior aspect of the artery. Left MCA: Initial injection demonstrates no significant filling of the middle cerebral artery. 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 Left ACA: Anterior cerebral artery perfused by the right-sided flow at the initiation. Final angiogram: After treatment of the carotid occlusion and deployment of 8 mm-6 mm x 40 mm carotid stent, there is excellent flow through the cervical carotid to the intracranial carotid. After mechanical thrombectomy of M1 occlusion, there is modified treatment in cerebral ischemia of 3. Flat panel CT: No complications with small contrast stain of the left basal ganglia. PROCEDURE: Patient was brought to the neurointerventional suite, with the patient identity confirmed, and consent form confirmed. General anesthesia was present for initiation of general endotracheal tube anesthesia. Patient was prepped and draped in the usual sterile fashion. The only venous access was a port catheter with single needle access. Ultrasound survey of the right inguinal region was performed with images stored and sent to PACs. 11 blade scalpel was used to make a small incision. A micropuncture needle was used access the right common femoral artery under ultrasound. With excellent arterial blood flow returned, an .018 micro wire was passed through the needle, observed to enter the abdominal aorta under fluoroscopy. The needle was removed, and a micropuncture sheath was placed over the wire. The inner dilator and wire were removed, and an 035  Bentson wire was advanced under fluoroscopy into the abdominal aorta. The sheath was removed and a standard 5 [place] vascular sheath was placed. The dilator was removed and the sheath was flushed. After the initial common femoral artery access, ultrasound survey of the left inguinal region was performed in order to place arterial monitoring line. Images stored and sent to PACs. Micropuncture needle was used access the left common femoral artery under ultrasound. With excellent arterial blood flow returned, an .018 micro wire was passed through the needle, observed to enter the abdominal aorta under fluoroscopy. The needle was removed, and a micropuncture sheath was placed over the wire. The inner dilator and wire were removed, and an  035 Bentson wire was advanced under fluoroscopy into the abdominal aorta. The sheath was removed and a standard 4 [place] vascular sheath was placed. The dilator was removed and the sheath was flushed. A 78F JB-1 diagnostic catheter was advanced over the wire through the right common femoral artery access to the proximal descending thoracic aorta. Wire was then removed. Double flush of the catheter was performed. Catheter was then used to select the left common carotid artery. Angiogram was performed. Using roadmap technique, the catheter was advanced over a roadrunner wire into right common carotid artery. Formal angiogram was performed. Catheter was advanced over the roadrunner wire into the external carotid artery. Wire was removed. Exchange length Rosen wire was then passed through the diagnostic catheter to the distal common carotid artery and the diagnostic catheter was removed. The 5 French sheath was removed and exchanged for 8 French 55 centimeter BrightTip sheath. Sheath was flushed and attached to pressurized and heparinized saline bag for constant forward flow. Then an 8 [place], 95 cm Flowgate balloon tip catheter was prepared on the back table with inflation of the balloon  with 50/50 concentration of dilute contrast. The balloon catheter was then advanced over the wire, positioned into the distal common carotid artery. Copious back flush was performed and the balloon catheter was attached to heparinized and pressurized saline bag for forward flow. Roadmap was then performed at the carotid bifurcation. Combination of a rapid transit microwire and a synchro soft wire were used to navigate beyond the occlusion at the proximal internal carotid artery. The microcatheter was then navigated into the distal cervical segment, wire was removed, blood was aspirated, and a gentle injection confirmed a luminal position. Exchange length Transcend wire was then placed through the microcatheter into the cervical carotid. Microcatheter was removed. Balloon angioplasty was then performed with a rapid exchange system, using a Viatrac balloon 5 mm x 30 mm. The patient required treatment at this time with atropine dose, as we experienced bradycardia into the 30s. His heart rate recovered after treatment by the anesthesia team. Balloon was removed. Angiogram confirmed flow into the cervical carotid. A coaxial system of an Ace 64 penumbra aspiration catheter and the Trevo pro view 18 catheter was then advanced over the exchange length wire into the cervical carotid. Once the coaxial system was in the cervical carotid, the microwire was removed and exchanged for synchro soft. The synchro soft wire was navigated beyond the occlusion under roadmap guidance, and the aspiration catheter was advanced into the ICA terminus. The balloon guide catheter was then advanced into the mid segment of the cervical carotid artery. The microcatheter and microwire were then carefully advanced through the occluded segment. Microcatheter was then push through the occluded segment and the wire was removed. Blood was then aspirated through the hub of the microcatheter, and a gentle contrast injection was performed confirming  intraluminal position. A rotating hemostatic valve was then attached to the back end of the microcatheter, and a pressurized and heparinized saline bag was attached to the catheter. 4 x 40 solitaire device was then selected. Back flush was achieved at the rotating hemostatic valve, and then the device was gently advanced through the microcatheter to the distal end. The retriever was then unsheathed by withdrawing the microcatheter under fluoroscopy. A 5 minutes interval was observed. At this time, ultrasound-guided access of the right common femoral vein was performed for placement of a additional venous access and placement of a 12 French triple-lumen IV catheter. We then proceeded with  the mechanical thrombectomy. The balloon at the balloon tip catheter was then inflated under fluoroscopy for proximal flow arrest. Constant aspiration was then performed at the penumbra aspiration catheter at the carotid terminus as the retriever was gently and slowly withdrawn with fluoroscopic observation. Once the retriever was entirely removed from the system, free aspiration was confirmed at the hub of the aspiration catheter, with free blood return confirmed. The balloon was then deflated, rotating hemostatic valve was reattached, and a control angiogram was performed, confirming restoration of flow. Penumbra catheter was then withdrawn, and the angiogram was performed at the carotid bifurcation. Given the appearance, we elected to place acute carotid stent. The lesion was then again navigated with synchro soft wire and rapid transit. With the microwire microcatheter positioned in the distal cervical carotid, the microwire was removed. The exchange length Transcend wire was then replaced and the microcatheter was removed. Acute carotid stent was then deployed, 8 mm-6 mm x 40 mm length. Before deployment, we initiated Integrilin drip treatment for dual anti-platelet therapy. Angiogram was performed. Coaxial system of the  penumbra aspiration catheter and the pro view 18 catheter were then navigated to the carotid siphon for better angiogram of the cerebral vasculature. Angiogram was performed. Microcatheter, a number aspiration catheter were withdrawn. The balloon guide catheter was positioned in the common carotid artery for final angiogram. All catheter and wires were removed. The sheath at the right common femoral artery access was withdrawn for an angiogram. Eight French Angio-Seal was then deployed for hemostasis. A flat panel CT was then performed.  Patient was then extubated. Estimated blood loss was 50 cc No complications encountered. IMPRESSION: Status post left-sided cerebral angiogram with treatment of left ICA/MCA tandem occlusion, requiring acute left carotid stenting and mechanical thrombectomy of a left M1 occlusion with achievement of mTICI-3 flow, and restoration of cervical ICA flow. Flat panel CT in the room demonstrates small contrast stain without complicating features, and the patient was extubated. Status post deployment of 8 French Angio-Seal for right common femoral artery hemostasis. Status post ultrasound-guided left common femoral artery access with a 4 French sheath for arterial monitoring, which remained after the case. Status post ultrasound-guided right common femoral vein access for additional IV access, which remained after the case transmitting the Integrilin drip. Signed, Dulcy Fanny. Earleen Newport, DO [facility] Radiology PLAN: Patient will go to the PACU ICU admission Bilateral hips will be straight until the left 4 French arterial monitoring sheath is removed and the right common femoral vein venous access sheath is removed. Routine wound management for the right common femoral artery sheath access. Frequent neurovascular checks. IV saline hydration for 8 hours given the renal insufficiency Blood pressure control management of 120-140 with nicardipine drip  ordered The goal will be dual anti-platelet therapy, for now with dose of 650 mg aspirin and the Integrilin drip. Electronically Signed   By: Corrie Mckusick D.O.   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 The antibiotic was administered within 1 hour of the procedure 75 MCG NITROGLYCERIN INTRA ARTERIAL 650 mg aspirin via orogastric tube. Intra procedural initiation of Integrilin IV drip for dual anti-platelet therapy. ANESTHESIA/SEDATION: General endotracheal tube anesthesia with anesthesia team CONTRAST:  144 cc IV contrast FLUOROSCOPY TIME:  Fluoroscopy Time: 54 minutes 6 seconds (2,926 mGy). COMPLICATIONS: None TECHNIQUE: Informed written consent was obtained from the patient's family after a thorough discussion of the procedural risks, benefits and alternatives. Specific risks discussed include: Bleeding, infection, contrast reaction, kidney injury/failure, need for further procedure/surgery, arterial injury or dissection, embolization to new territory, intracranial hemorrhage (10-15% risk), neurologic deterioration, cardiopulmonary collapse, death. All questions  were addressed. Maximal Sterile Barrier Technique was utilized including during the procedure including caps, mask, sterile gowns, sterile gloves, sterile drape, hand hygiene and skin antiseptic. A timeout was performed prior to the initiation of the procedure. FINDINGS: Initial angiogram: Left common carotid artery:  Normal course caliber and contour. Left external carotid artery: Patent with antegrade flow. On the initial injection before treatment, there is minimal opacification of the carotid siphon secondary to meningeal branches which appear to be region ating from ascending pharyngeal distribution and potentially deep meningeal branches from the external carotid artery. No significant filling of the MCA on the initial injection The initial injection does confirmed perfusion of the lenticulostriate vasculature, as well as a patent anterior coronal artery, with minimal filling of temporal lobe. Anterior basal ganglia structures are perfused. Left internal carotid artery: Internal carotid artery is occluded at the origin with a string sign at the posterior aspect of the artery. Left MCA: Initial injection demonstrates no significant filling of the middle cerebral artery. 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 Left ACA: Anterior cerebral artery perfused by the right-sided flow at the initiation. Final angiogram: After treatment of the carotid occlusion and deployment of 8 mm-6 mm x 40 mm carotid stent, there is excellent flow through the cervical carotid to the intracranial carotid. After mechanical thrombectomy of M1 occlusion, there is modified treatment in cerebral ischemia of 3. Flat panel CT: No complications with small contrast stain of the left basal ganglia. PROCEDURE: Patient was brought to the neurointerventional suite, with the patient identity confirmed, and consent form confirmed. General anesthesia was present for initiation of general  endotracheal tube anesthesia. Patient was prepped and draped in the usual sterile fashion. The only venous access was a port catheter with single needle access. Ultrasound survey of the right inguinal region was performed with images stored and sent to PACs. 11 blade scalpel was used to make a small incision. A micropuncture needle was used access the right common femoral artery under ultrasound. With excellent arterial blood flow returned, an .018 micro wire was passed through the needle, observed to enter the abdominal aorta under fluoroscopy. The needle was removed, and a micropuncture sheath was placed over the wire. The inner dilator and wire were removed, and an 035 Bentson wire was advanced under fluoroscopy into the abdominal aorta. The sheath was removed and a standard 5 [place] vascular sheath was placed. The dilator was removed and the sheath was flushed. After the initial common femoral artery access, ultrasound survey of the left inguinal region was performed in order to place arterial  monitoring line. Images stored and sent to PACs. Micropuncture needle was used access the left common femoral artery under ultrasound. With excellent arterial blood flow returned, an .018 micro wire was passed through the needle, observed to enter the abdominal aorta under fluoroscopy. The needle was removed, and a micropuncture sheath was placed over the wire. The inner dilator and wire were removed, and an 035 Bentson wire was advanced under fluoroscopy into the abdominal aorta. The sheath was removed and a standard 4 [place] vascular sheath was placed. The dilator was removed and the sheath was flushed. A 86F JB-1 diagnostic catheter was advanced over the wire through the right common femoral artery access to the proximal descending thoracic aorta. Wire was then removed. Double flush of the catheter was performed. Catheter was then used to select the left common carotid artery. Angiogram was performed. Using roadmap  technique, the catheter was advanced over a roadrunner wire into right common carotid artery. Formal angiogram was performed. Catheter was advanced over the roadrunner wire into the external carotid artery. Wire was removed. Exchange length Rosen wire was then passed through the diagnostic catheter to the distal common carotid artery and the diagnostic catheter was removed. The 5 French sheath was removed and exchanged for 8 French 55 centimeter BrightTip sheath. Sheath was flushed and attached to pressurized and heparinized saline bag for constant forward flow. Then an 8 [place], 95 cm Flowgate balloon tip catheter was prepared on the back table with inflation of the balloon with 50/50 concentration of dilute contrast. The balloon catheter was then advanced over the wire, positioned into the distal common carotid artery. Copious back flush was performed and the balloon catheter was attached to heparinized and pressurized saline bag for forward flow. Roadmap was then performed at the carotid bifurcation. Combination of a rapid transit microwire and a synchro soft wire were used to navigate beyond the occlusion at the proximal internal carotid artery. The microcatheter was then navigated into the distal cervical segment, wire was removed, blood was aspirated, and a gentle injection confirmed a luminal position. Exchange length Transcend wire was then placed through the microcatheter into the cervical carotid. Microcatheter was removed. Balloon angioplasty was then performed with a rapid exchange system, using a Viatrac balloon 5 mm x 30 mm. The patient required treatment at this time with atropine dose, as we experienced bradycardia into the 30s. His heart rate recovered after treatment by the anesthesia team. Balloon was removed. Angiogram confirmed flow into the cervical carotid. A coaxial system of an Ace 64 penumbra aspiration catheter and the Trevo pro view 18 catheter was then advanced over the exchange length  wire into the cervical carotid. Once the coaxial system was in the cervical carotid, the microwire was removed and exchanged for synchro soft. The synchro soft wire was navigated beyond the occlusion under roadmap guidance, and the aspiration catheter was advanced into the ICA terminus. The balloon guide catheter was then advanced into the mid segment of the cervical carotid artery. The microcatheter and microwire were then carefully advanced through the occluded segment. Microcatheter was then push through the occluded segment and the wire was removed. Blood was then aspirated through the hub of the microcatheter, and a gentle contrast injection was performed confirming intraluminal position. A rotating hemostatic valve was then attached to the back end of the microcatheter, and a pressurized and heparinized saline bag was attached to the catheter. 4 x 40 solitaire device was then selected. Back flush was achieved at the rotating hemostatic  valve, and then the device was gently advanced through the microcatheter to the distal end. The retriever was then unsheathed by withdrawing the microcatheter under fluoroscopy. A 5 minutes interval was observed. At this time, ultrasound-guided access of the right common femoral vein was performed for placement of a additional venous access and placement of a 12 French triple-lumen IV catheter. We then proceeded with the mechanical thrombectomy. The balloon at the balloon tip catheter was then inflated under fluoroscopy for proximal flow arrest. Constant aspiration was then performed at the penumbra aspiration catheter at the carotid terminus as the retriever was gently and slowly withdrawn with fluoroscopic observation. Once the retriever was entirely removed from the system, free aspiration was confirmed at the hub of the aspiration catheter, with free blood return confirmed. The balloon was then deflated, rotating hemostatic valve was reattached, and a control angiogram was  performed, confirming restoration of flow. Penumbra catheter was then withdrawn, and the angiogram was performed at the carotid bifurcation. Given the appearance, we elected to place acute carotid stent. The lesion was then again navigated with synchro soft wire and rapid transit. With the microwire microcatheter positioned in the distal cervical carotid, the microwire was removed. The exchange length Transcend wire was then replaced and the microcatheter was removed. Acute carotid stent was then deployed, 8 mm-6 mm x 40 mm length. Before deployment, we initiated Integrilin drip treatment for dual anti-platelet therapy. Angiogram was performed. Coaxial system of the penumbra aspiration catheter and the pro view 18 catheter were then navigated to the carotid siphon for better angiogram of the cerebral vasculature. Angiogram was performed. Microcatheter, a number aspiration catheter were withdrawn. The balloon guide catheter was positioned in the common carotid artery for final angiogram. All catheter and wires were removed. The sheath at the right common femoral artery access was withdrawn for an angiogram. Eight French Angio-Seal was then deployed for hemostasis. A flat panel CT was then performed.  Patient was then extubated. Estimated blood loss was 50 cc No complications encountered. IMPRESSION: Status post left-sided cerebral angiogram with treatment of left ICA/MCA tandem occlusion, requiring acute left carotid stenting and mechanical thrombectomy of a left M1 occlusion with achievement of mTICI-3 flow, and restoration of cervical ICA flow. Flat panel CT in the room demonstrates small contrast stain without complicating features, and the patient was extubated. Status post deployment of 8 French Angio-Seal for right common femoral artery hemostasis. Status post ultrasound-guided left common femoral artery access with a 4 French sheath for arterial monitoring, which remained after the case. Status post  ultrasound-guided right common femoral vein access for additional IV access, which remained after the case transmitting the Integrilin drip. Signed, Dulcy Fanny. Earleen Newport, DO [facility] Radiology PLAN: Patient will go to the PACU ICU admission Bilateral hips will be straight until the left 4 French arterial monitoring sheath is removed and the right common femoral vein venous access sheath is removed. Routine wound management for the right common femoral artery sheath access. Frequent neurovascular checks. IV saline hydration for 8 hours given the renal insufficiency Blood pressure control management of 120-140 with nicardipine drip ordered The goal will be dual anti-platelet therapy, for now with dose of 650 mg aspirin and the Integrilin drip. Electronically Signed   By: Corrie Mckusick D.O.   On: 05/06/2017 09:24   Dg Chest Port 1 View  Result Date: 05/06/2017 CLINICAL DATA:  Shortness of breath. Patient status post left MCA thrombectomy and left ICA stent placement today.  EXAM: PORTABLE CHEST 1 VIEW COMPARISON:  PA and lateral chest 03/18/2017.  CT chest 03/24/2017. FINDINGS: Interstitial prominence about the patient's left upper lobe pulmonary mass is worse than on the prior plain films of the chest. Mild basilar atelectasis is noted. Heart size is normal. Port-A-Cath is in place. Aortic atherosclerosis is seen. IMPRESSION: Increased interstitial opacities about the patient's left upper lobe pulmonary mass are nonspecific and could be due to tumor spread, postobstructive pneumonitis or less likely hemorrhage. Atherosclerosis. Electronically Signed   By: Inge Rise M.D.   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 The antibiotic was administered within 1 hour of the procedure 75 MCG NITROGLYCERIN INTRA ARTERIAL 650 mg aspirin via orogastric tube. Intra procedural initiation of Integrilin IV drip for dual anti-platelet therapy. ANESTHESIA/SEDATION: General endotracheal tube anesthesia with anesthesia team CONTRAST:  144 cc IV contrast FLUOROSCOPY TIME:  Fluoroscopy Time: 54 minutes 6 seconds (2,926 mGy). COMPLICATIONS: None TECHNIQUE: Informed written consent was obtained from the patient's family after a thorough discussion of the procedural risks, benefits and alternatives. Specific risks discussed include: Bleeding, infection, contrast reaction, kidney injury/failure, need for further procedure/surgery, arterial injury or dissection, embolization to new territory, intracranial hemorrhage (10-15% risk), neurologic deterioration, cardiopulmonary collapse, death. All questions were addressed. Maximal Sterile Barrier Technique was utilized including during the procedure including caps, mask, sterile gowns, sterile gloves, sterile drape, hand hygiene and skin antiseptic. A timeout was performed prior to the initiation of the procedure. FINDINGS: Initial angiogram: Left common carotid artery:  Normal course caliber and contour. Left external carotid artery: Patent with antegrade  flow. On the initial injection before treatment, there is minimal opacification of the carotid siphon secondary to meningeal branches which appear to be region ating from ascending pharyngeal distribution and potentially deep meningeal branches from the external carotid artery. No significant filling of the MCA on the initial injection The initial injection does confirmed perfusion of the lenticulostriate vasculature, as well as a patent anterior coronal artery, with minimal filling of temporal lobe. Anterior basal ganglia structures are perfused. Left internal carotid artery: Internal carotid artery is occluded at the origin with a string sign at the posterior aspect of the artery. Left MCA: Initial injection demonstrates no significant filling of the middle cerebral artery. 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 Left ACA: Anterior cerebral artery perfused by the right-sided flow at the initiation. Final angiogram: After treatment of the carotid occlusion and deployment of 8 mm-6 mm x 40 mm carotid stent, there is excellent flow through the cervical carotid to the intracranial carotid. After mechanical thrombectomy of M1 occlusion, there is modified treatment in cerebral ischemia of 3. Flat panel CT: No complications with small contrast stain  of the left basal ganglia. PROCEDURE: Patient was brought to the neurointerventional suite, with the patient identity confirmed, and consent form confirmed. General anesthesia was present for initiation of general endotracheal tube anesthesia. Patient was prepped and draped in the usual sterile fashion. The only venous access was a port catheter with single needle access. Ultrasound survey of the right inguinal region was performed with images stored and sent to PACs. 11 blade scalpel was used to make a small incision. A micropuncture needle was used access the right common femoral artery under ultrasound. With  excellent arterial blood flow returned, an .018 micro wire was passed through the needle, observed to enter the abdominal aorta under fluoroscopy. The needle was removed, and a micropuncture sheath was placed over the wire. The inner dilator and wire were removed, and an 035 Bentson wire was advanced under fluoroscopy into the abdominal aorta. The sheath was removed and a standard 5 [place] vascular sheath was placed. The dilator was removed and the sheath was flushed. After the initial common femoral artery access, ultrasound survey of the left inguinal region was performed in order to place arterial monitoring line. Images stored and sent to PACs. Micropuncture needle was used access the left common femoral artery under ultrasound. With excellent arterial blood flow returned, an .018 micro wire was passed through the needle, observed to enter the abdominal aorta under fluoroscopy. The needle was removed, and a micropuncture sheath was placed over the wire. The inner dilator and wire were removed, and an 035 Bentson wire was advanced under fluoroscopy into the abdominal aorta. The sheath was removed and a standard 4 [place] vascular sheath was placed. The dilator was removed and the sheath was flushed. A 55F JB-1 diagnostic catheter was advanced over the wire through the right common femoral artery access to the proximal descending thoracic aorta. Wire was then removed. Double flush of the catheter was performed. Catheter was then used to select the left common carotid artery. Angiogram was performed. Using roadmap technique, the catheter was advanced over a roadrunner wire into right common carotid artery. Formal angiogram was performed. Catheter was advanced over the roadrunner wire into the external carotid artery. Wire was removed. Exchange length Rosen wire was then passed through the diagnostic catheter to the distal common carotid artery and the diagnostic catheter was removed. The 5 French sheath was removed  and exchanged for 8 French 55 centimeter BrightTip sheath. Sheath was flushed and attached to pressurized and heparinized saline bag for constant forward flow. Then an 8 [place], 95 cm Flowgate balloon tip catheter was prepared on the back table with inflation of the balloon with 50/50 concentration of dilute contrast. The balloon catheter was then advanced over the wire, positioned into the distal common carotid artery. Copious back flush was performed and the balloon catheter was attached to heparinized and pressurized saline bag for forward flow. Roadmap was then performed at the carotid bifurcation. Combination of a rapid transit microwire and a synchro soft wire were used to navigate beyond the occlusion at the proximal internal carotid artery. The microcatheter was then navigated into the distal cervical segment, wire was removed, blood was aspirated, and a gentle injection confirmed a luminal position. Exchange length Transcend wire was then placed through the microcatheter into the cervical carotid. Microcatheter was removed. Balloon angioplasty was then performed with a rapid exchange system, using a Viatrac balloon 5 mm x 30 mm. The patient required treatment at this time with atropine dose, as we experienced bradycardia into the  30s. His heart rate recovered after treatment by the anesthesia team. Balloon was removed. Angiogram confirmed flow into the cervical carotid. A coaxial system of an Ace 64 penumbra aspiration catheter and the Trevo pro view 18 catheter was then advanced over the exchange length wire into the cervical carotid. Once the coaxial system was in the cervical carotid, the microwire was removed and exchanged for synchro soft. The synchro soft wire was navigated beyond the occlusion under roadmap guidance, and the aspiration catheter was advanced into the ICA terminus. The balloon guide catheter was then advanced into the mid segment of the cervical carotid artery. The microcatheter and  microwire were then carefully advanced through the occluded segment. Microcatheter was then push through the occluded segment and the wire was removed. Blood was then aspirated through the hub of the microcatheter, and a gentle contrast injection was performed confirming intraluminal position. A rotating hemostatic valve was then attached to the back end of the microcatheter, and a pressurized and heparinized saline bag was attached to the catheter. 4 x 40 solitaire device was then selected. Back flush was achieved at the rotating hemostatic valve, and then the device was gently advanced through the microcatheter to the distal end. The retriever was then unsheathed by withdrawing the microcatheter under fluoroscopy. A 5 minutes interval was observed. At this time, ultrasound-guided access of the right common femoral vein was performed for placement of a additional venous access and placement of a 12 French triple-lumen IV catheter. We then proceeded with the mechanical thrombectomy. The balloon at the balloon tip catheter was then inflated under fluoroscopy for proximal flow arrest. Constant aspiration was then performed at the penumbra aspiration catheter at the carotid terminus as the retriever was gently and slowly withdrawn with fluoroscopic observation. Once the retriever was entirely removed from the system, free aspiration was confirmed at the hub of the aspiration catheter, with free blood return confirmed. The balloon was then deflated, rotating hemostatic valve was reattached, and a control angiogram was performed, confirming restoration of flow. Penumbra catheter was then withdrawn, and the angiogram was performed at the carotid bifurcation. Given the appearance, we elected to place acute carotid stent. The lesion was then again navigated with synchro soft wire and rapid transit. With the microwire microcatheter positioned in the distal cervical carotid, the microwire was removed. The exchange length  Transcend wire was then replaced and the microcatheter was removed. Acute carotid stent was then deployed, 8 mm-6 mm x 40 mm length. Before deployment, we initiated Integrilin drip treatment for dual anti-platelet therapy. Angiogram was performed. Coaxial system of the penumbra aspiration catheter and the pro view 18 catheter were then navigated to the carotid siphon for better angiogram of the cerebral vasculature. Angiogram was performed. Microcatheter, a number aspiration catheter were withdrawn. The balloon guide catheter was positioned in the common carotid artery for final angiogram. All catheter and wires were removed. The sheath at the right common femoral artery access was withdrawn for an angiogram. Eight French Angio-Seal was then deployed for hemostasis. A flat panel CT was then performed.  Patient was then extubated. Estimated blood loss was 50 cc No complications encountered. IMPRESSION: Status post left-sided cerebral angiogram with treatment of left ICA/MCA tandem occlusion, requiring acute left carotid stenting and mechanical thrombectomy of a left M1 occlusion with achievement of mTICI-3 flow, and restoration of cervical ICA flow. Flat panel CT in the room demonstrates small contrast stain without complicating features, and the patient was extubated. Status post deployment of 8  [place] Angio-Seal for right common femoral artery hemostasis. Status post ultrasound-guided left common femoral artery access with a 4 French sheath for arterial monitoring, which remained after the case. Status post ultrasound-guided right common femoral vein access for additional IV access, which remained after the case transmitting the Integrilin drip. Signed, Dulcy Fanny. Earleen Newport, DO [facility] Radiology PLAN: Patient will go to the PACU ICU admission Bilateral hips will be straight until the left 4 French arterial monitoring sheath is removed and the right common femoral vein  venous access sheath is removed. Routine wound management for the right common femoral artery sheath access. Frequent neurovascular checks. IV saline hydration for 8 hours given the renal insufficiency Blood pressure control management of 120-140 with nicardipine drip ordered The goal will be dual anti-platelet therapy, for now with dose of 650 mg aspirin and the Integrilin drip. Electronically Signed   By: Corrie Mckusick D.O.   On: 05/06/2017 09:24   Ct Head Code Stroke Wo Contrast  Result Date: 05/06/2017 CLINICAL DATA:  Code stroke. 77 y/o M; right-sided weakness and numbness with slurred speech. EXAM: CT HEAD WITHOUT CONTRAST TECHNIQUE: Contiguous axial images were obtained from the base of the skull through the vertex without intravenous contrast. COMPARISON:  11/19/2016 MRI of the head. FINDINGS: Brain: No evidence of acute infarction, hemorrhage, hydrocephalus, extra-axial collection or mass lesion/mass effect. Stable moderate chronic microvascular ischemic changes and parenchymal volume loss of the brain. Vascular: Calcific atherosclerosis of carotid siphons. Dense left M1 (series 5, image 30). Skull: Normal. Negative for fracture or focal lesion. Sinuses/Orbits: No acute finding. Other: None. ASPECTS [facility] Stroke Program Early CT Score) - Ganglionic level infarction (caudate, lentiform nuclei, internal capsule, insula, M1-M3 cortex): 7 - Supraganglionic infarction (M4-M6 cortex): 3 Total score (0-10 with 10 being normal): 10 IMPRESSION: 1. No acute stroke, hemorrhage, or mass effect identified. 2. Dense left M1, possible thrombus. 3. ASPECTS is 10 4. Stable chronic microvascular ischemic changes and parenchymal volume loss of the brain given differences in technique. These results were communicated to Dr. Cheral Marker at 3:41 amon 2/28/2019by text page via the [facility] messaging system. Electronically Signed   By: Kristine Garbe M.D.   On: 05/06/2017 03:43   TTE : Left ventricle: The cavity size  was normal. There was mild concentric hypertrophy. Systolic function was normal. The estimated ejection fraction was in the range of 60% to 65%. Wall motion was normal; there were no regional wall motion abnormalities   PHYSICAL EXAM  Temp:  [98.5 F (36.9 C)-99.8 F (37.7 C)] 98.5 F (36.9 C) (03/02 1200) Pulse Rate:  [55-88] 67 (03/02 1230) Resp:  [13-27] 19 (03/02 1230) BP: (70-159)/(39-101) 105/53 (03/02 1230) SpO2:  [92 %-100 %] 97 % (03/02 1230)  General - Well nourished, well developed pleasant elderly African-American male, in no acute distress.  Ophthalmologic - fundi not visualized due to noncooperation.  Cardiovascular - Regular rate and rhythm.  Neuro - awake alert, eyes open, follows simple commands, paucity of speech, moderate dysarthria, able to repeat sentences and name. Eyes no gaze preference, no neglect, blinking to visual threat bilaterally. PERRL, right facial droop, tongue midline. RUE 4/5 with drift, BLE 4/5, LUE 5/5. DTR 1+ and no babinski. Sensation symmetrical, coordination intact bilaterally, gait deferred.    ASSESSMENT/PLAN Mr. Keshon Markovitz is a 77 y.o. 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  No tPA given due to concern of brain metastasis.    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 cardioembolic source  Resultant left gaze preference, right facial droop and right UE mild weakness  MRI  Left BG, mesial temporal lobe small infarcts and left MCA punctate infarcts  MRA  Patent left ICA and MCA  CTA head and neck - left ICA and left M1 occlusion  DSA - TICI3 reperfusion, left ICA stenting  2D Echo  Pending  Will do TEE and loop Monday to evaluate cardiac source  LDL  69  HgbA1c 9.1  subq heparin for VTE prophylaxis Fall precautions  Diet heart healthy/carb modified Room service appropriate? Yes; Fluid consistency: Thin   aspirin 81 mg daily prior to admission, now on aspirin 325 mg daily and integrelin. Will stop integrelin today and switch to plavix with loading dose. Continue ASA  Ongoing aggressive stroke risk factor management  Therapy recommendations:  Pending   Disposition:  Pending  Small cell lung cancer   11/2016 brain MRI no brain metastasis  On chemo and radiation  Follows with [facility]  Discussed with Dr. Rogue Bussing, pt primary oncologist, no concern of hypercoagulable state at this time, he needs further cardiac work up  Diabetes  HgbA1c 9.1 goal < 7.0  Hyperglycemia, but overnight hypoglycemia - likely due to not eating well - now eating good  On pre-meal insulin now  Resume home po meds  CBG monitoring  SSI  DM coordinator consult   Hypertension but now hypotension BP at low end Likely due to ICA stenting  Long term BP goal normotensive  completed albumin  On IVF  On neo  Put on midodrine, taper off neo as able  Hyperlipidemia  Home meds:  none   LDL 69, goal < 70  Statin allergy listed in file  CKD  Cre 1.98->1.80->1.88  At baseline   Continue IVF  Other Stroke Risk Factors  Advanced age  Former smoker - quit 13 years ago  Other Active Problems Hypotension  Hospital day # 2  Long discussion of the bedside with the patient's wife. Add Florinef 0.1 mg total midodrine 5 mg 3 times daily for his hypotension. Continue fluid boluses. Consult medical hospitalist team to help manage his hypotension. 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 This patient's care requires constant monitoring of vital signs, hemodynamics, respiratory and cardiac monitoring, review of multiple databases, neurological assessment, discussion with family, other specialists and medical decision making of high complexity. I had long discussion with wife at bedside, updated pt current condition, treatment plan and potential prognosis. She expressed understanding and appreciation. I spent 40 minutes of neurocritical care time in the care of this patient.   Antony Contras,, MD   Stroke Neurology 05/08/2017 1:26 PM    To contact Stroke Continuity provider, please refer to [email or web address] After hours, contact General Neurology

## 2017-05-08 NOTE — Progress Notes (Signed)
Mr Delman BP lowered to 80s/50. Bolus hung per order. MD Leonie Man  paged after bolus as BP only 99/51.

## 2017-05-09 ENCOUNTER — Other Ambulatory Visit (HOSPITAL_COMMUNITY): Payer: Medicare Other

## 2017-05-09 DIAGNOSIS — E119 Type 2 diabetes mellitus without complications: Secondary | ICD-10-CM

## 2017-05-09 DIAGNOSIS — G8191 Hemiplegia, unspecified affecting right dominant side: Secondary | ICD-10-CM

## 2017-05-09 DIAGNOSIS — I950 Idiopathic hypotension: Secondary | ICD-10-CM | POA: Diagnosis present

## 2017-05-09 DIAGNOSIS — R4701 Aphasia: Secondary | ICD-10-CM

## 2017-05-09 DIAGNOSIS — I63512 Cerebral infarction due to unspecified occlusion or stenosis of left middle cerebral artery: Principal | ICD-10-CM

## 2017-05-09 DIAGNOSIS — I639 Cerebral infarction, unspecified: Secondary | ICD-10-CM

## 2017-05-09 LAB — CBC
HEMATOCRIT: 32.1 % — AB (ref 39.0–52.0)
HEMOGLOBIN: 10.5 g/dL — AB (ref 13.0–17.0)
MCH: 31.1 pg (ref 26.0–34.0)
MCHC: 32.7 g/dL (ref 30.0–36.0)
MCV: 95 fL (ref 78.0–100.0)
Platelets: 184 10*3/uL (ref 150–400)
RBC: 3.38 MIL/uL — AB (ref 4.22–5.81)
RDW: 13.1 % (ref 11.5–15.5)
WBC: 5.7 10*3/uL (ref 4.0–10.5)

## 2017-05-09 LAB — GLUCOSE, CAPILLARY
GLUCOSE-CAPILLARY: 177 mg/dL — AB (ref 65–99)
GLUCOSE-CAPILLARY: 102 mg/dL — AB (ref 65–99)
Glucose-Capillary: 195 mg/dL — ABNORMAL HIGH (ref 65–99)
Glucose-Capillary: 120 mg/dL — ABNORMAL HIGH (ref 65–99)

## 2017-05-09 LAB — BASIC METABOLIC PANEL
Anion gap: 11 (ref 5–15)
BUN: 32 mg/dL — AB (ref 6–20)
CALCIUM: 8.1 mg/dL — AB (ref 8.9–10.3)
CO2: 18 mmol/L — ABNORMAL LOW (ref 22–32)
Chloride: 108 mmol/L (ref 101–111)
Creatinine, Ser: 1.81 mg/dL — ABNORMAL HIGH (ref 0.61–1.24)
GFR calc Af Amer: 40 mL/min — ABNORMAL LOW (ref >60)
GFR, EST NON AFRICAN AMERICAN: 34 mL/min — AB (ref >60)
GLUCOSE: 236 mg/dL — AB (ref 65–99)
Potassium: 4 mmol/L (ref 3.5–5.1)
Sodium: 137 mmol/L (ref 135–145)

## 2017-05-09 LAB — ACTH STIMULATION, 3 TIME POINTS
Cortisol, 30 Min: 12.1 ug/dL
Cortisol, 60 Min: 11.5 ug/dL
Cortisol, Base: 11.9 ug/dL

## 2017-05-09 MED ORDER — SODIUM CHLORIDE 0.9 % IV SOLN
INTRAVENOUS | Status: DC
  Administered 2017-05-10: 14:00:00 via INTRAVENOUS

## 2017-05-09 MED ORDER — ACYCLOVIR 5 % EX OINT
TOPICAL_OINTMENT | Freq: Three times a day (TID) | CUTANEOUS | Status: DC
  Administered 2017-05-09 – 2017-05-11 (×5): via TOPICAL
  Filled 2017-05-09: qty 15

## 2017-05-09 NOTE — Evaluation (Signed)
Occupational Therapy Evaluation Patient Details Name: Edwin Jones MRN: [id number] DOB: 01-Dec-1940 Today's Date: 05/09/2017    History of Present Illness [name] is a 77 y.o. 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 No tPA given due to concern of brain metastasis.  Per MRI, IR has restored patency of the left ICA, terminus and left MCA.  Showed infarct to the left BG, left amygdala and small scattered tiny cortical infarcts throughout the left MCA territory.   Clinical Impression   PTA, pt was living with his wife and was independent. Pt currently requiring Min A throughout ADLs and functional mobility with RW. Pt requiring increase time and cues throughout session due to decreased cognition. Pt presenting with poor balance and functional use of right (dominant) hand. Pt motivated to participate in therapy and wife is very supportive. Pt will required acute OT to facilitate safe dc. Recommend dc to CIR for intensive OT to optimize safety, independence with ADLs, IADLs, and functional mobility, and return to PLOF.     Follow Up Recommendations  CIR;Supervision/Assistance - 24 hour    Equipment Recommendations  Other (comment)(Defer to next venue)    Recommendations for Other Services Rehab consult;PT consult;Speech consult     Precautions / Restrictions Precautions Precautions: Fall Restrictions Weight Bearing Restrictions: No      Mobility Bed Mobility Overal bed mobility: Needs Assistance Bed Mobility: Supine to Sit     Supine to sit: +2 for safety/equipment;Min guard;HOB elevated     General bed mobility comments: Min Guard for safety  Transfers Overall transfer level: Needs assistance Equipment used: Rolling walker (2 wheeled) Transfers: Sit to/from Stand Sit to Stand: +2 safety/equipment;Min assist         General  transfer comment: Min A for power up into standing. +2 for safety. VCs for hadn placement.     Balance Overall balance assessment: Needs assistance Sitting-balance support: No upper extremity supported;Feet supported Sitting balance-Leahy Scale: Fair     Standing balance support: Bilateral upper extremity supported Standing balance-Leahy Scale: Poor Standing balance comment: reliant on external support                           ADL either performed or assessed with clinical judgement   ADL Overall ADL's : Needs assistance/impaired     Grooming: Wash/dry hands;Set up;Sitting Grooming Details (indicate cue type and reason): Pt using hand sanitizer after toileting. required cues for sequencing and intiating task Upper Body Bathing: Minimal assistance;Sitting   Lower Body Bathing: Minimal assistance;Sit to/from stand   Upper Body Dressing : Minimal assistance;Sitting   Lower Body Dressing: Minimal assistance;Sit to/from stand Lower Body Dressing Details (indicate cue type and reason): Pt able to reach forward and adjust socks at EOB. Min A for standing balance Toilet Transfer: Minimal assistance;+2 for safety/equipment;Ambulation;RW(Simulated to recliner)   Toileting- Clothing Manipulation and Hygiene: Minimal assistance;Sit to/from stand Toileting - Clothing Manipulation Details (indicate cue type and reason): Pt performing toilet hygiene after bowl incontinence in bed. Pt requiring Min A to make sure he was completely clean.      Functional mobility during ADLs: Min guard;Rolling walker General ADL Comments: Pt demonstrating decreased fucntional performance. Pt with poor cognition, balance, and fucnitonal use of RUE.     Vision Baseline Vision/History: Wears glasses Wears Glasses: Reading only Patient Visual Report: No change  from baseline Vision Assessment?: Yes;Vision impaired- to be further tested in functional context Eye Alignment: Within Functional  Limits Ocular Range of Motion: Within Functional Limits Alignment/Gaze Preference: Within Defined Limits Tracking/Visual Pursuits: Decreased smoothness of horizontal tracking;Decreased smoothness of vertical tracking;Unable to hold eye position out of midline;Requires cues, head turns, or add eye shifts to track;Impaired - to be further tested in functional context Convergence: Within functional limits Depth Perception: Undershoots Additional Comments: Pt with poor smooth tracking and required cues to attend to items during visual testing. Quickly fatigues throughout vision testing.      Perception     Praxis      Pertinent Vitals/Pain Pain Assessment: No/denies pain Pain Intervention(s): Monitored during session;Limited activity within patient's tolerance;Repositioned     Hand Dominance Right   Extremity/Trunk Assessment Upper Extremity Assessment Upper Extremity Assessment: RUE deficits/detail RUE Deficits / Details: Decreased grasp strength and poor cooridnation. required increased cues, time, and effort for finger opposition. undershooting during finger-to-nose test presenting with dysmetria.  RUE Coordination: decreased fine motor;decreased gross motor   Lower Extremity Assessment Lower Extremity Assessment: Defer to PT evaluation RLE Deficits / Details: R LE weakened, moves syngergistically with some isolation.  Gross extension 4-/5 RLE Coordination: decreased fine motor LLE Deficits / Details: WFL   Cervical / Trunk Assessment Cervical / Trunk Assessment: Normal   Communication Communication Communication: No difficulties;Expressive difficulties(?some receptive aphasia, but functional for eval.)   Cognition Arousal/Alertness: Awake/alert Behavior During Therapy: Flat affect Overall Cognitive Status: Impaired/Different from baseline Area of Impairment: Attention;Memory;Following commands;Safety/judgement;Awareness;Problem solving                   Current  Attention Level: Sustained Memory: Decreased short-term memory Following Commands: Follows one step commands inconsistently;Follows one step commands with increased time Safety/Judgement: Decreased awareness of safety;Decreased awareness of deficits Awareness: Emergent Problem Solving: Slow processing;Difficulty sequencing;Requires verbal cues;Requires tactile cues General Comments: Pt requiring increased time and cues throughout session. Pt with flat affect and required increased time to answer questions.    General Comments  VSS. Wife present throughout session    Exercises     Shoulder Instructions      [place] expects to be discharged to:: Private residence Living Arrangements: Spouse/significant other Available Help at Discharge: Family;Available 24 hours/day Type of Home: House Home Access: Stairs to enter [employer] of Steps: 2 Entrance Stairs-Rails: Right;Left Home Layout: One level     Bathroom Shower/Tub: Occupational psychologist: Standard     Home Equipment: None          Prior Functioning/Environment Level of Independence: Independent        Comments: does all his ADL's and drives        OT Problem List: Decreased strength;Decreased range of motion;Decreased activity tolerance;Impaired balance (sitting and/or standing);Impaired vision/perception;Decreased coordination;Decreased cognition;Decreased safety awareness;Decreased knowledge of use of DME or AE;Decreased knowledge of precautions;Impaired UE functional use;Pain      OT Treatment/Interventions: Self-care/ADL training;Therapeutic exercise;Energy conservation;DME and/or AE instruction;Therapeutic activities;Patient/family education    OT Goals(Current goals can be found in the care plan section) Acute Rehab OT Goals Patient Stated Goal: home independent OT Goal Formulation: With patient Time For Goal Achievement: 05/23/17 Potential to Achieve Goals:  Good ADL Goals Pt Will Perform Grooming: with set-up;with supervision;standing Pt Will Perform Upper Body Dressing: sitting;with modified independence Pt Will Perform Lower Body Dressing: with set-up;with supervision;sit to/from stand Pt Will Transfer to Toilet: with set-up;with supervision;ambulating;bedside commode Pt Will Perform Toileting - Clothing Manipulation  and hygiene: with set-up;with supervision;sit to/from stand Additional ADL Goal #1: Pt will perform three step trail making task with 1-2 verbal cues  OT Frequency: Min 2X/week   Barriers to D/C:            Co-evaluation              AM-PAC PT "6 Clicks" Daily Activity     Outcome Measure Help from another person eating meals?: None Help from another person taking care of personal grooming?: A Little Help from another person toileting, which includes using toliet, bedpan, or urinal?: A Little Help from another person bathing (including washing, rinsing, drying)?: A Little Help from another person to put on and taking off regular upper body clothing?: A Little Help from another person to put on and taking off regular lower body clothing?: A Little 6 Click Score: 19   End of Session Equipment Utilized During Treatment: Gait belt;Rolling walker Nurse Communication: Mobility status;Precautions  Activity Tolerance: Patient tolerated treatment well Patient left: in chair;with call bell/phone within reach;with family/visitor present  OT Visit Diagnosis: Unsteadiness on feet (R26.81);Other abnormalities of gait and mobility (R26.89);Muscle weakness (generalized) (M62.81);Other symptoms and signs involving cognitive function;Hemiplegia and hemiparesis Hemiplegia - Right/Left: Right Hemiplegia - dominant/non-dominant: Dominant Hemiplegia - caused by: Cerebral infarction                Time: 6222-9798 OT Time Calculation (min): 27 min Charges:  OT General Charges $OT Visit: 1 Visit OT Evaluation $OT Eval Moderate  Complexity: 1 Mod OT Treatments $Self Care/Home Management : 8-22 mins G-Codes:     Eschol Auxier MSOT, OTR/L Acute Rehab Pager: [phone] Office: [place] 05/09/2017, 5:35 PM

## 2017-05-09 NOTE — Progress Notes (Signed)
STROKE TEAM PROGRESS NOTE   SUBJECTIVE (INTERVAL HISTORY) His  RN is at the bedside.  Overall he feels his condition is stable. His blood pressure has been running ok now in the 294-765 systolic range after cautious hydration. Appreciate medical hospitalist team help OBJECTIVE Temp:  [98.3 F (36.8 C)-98.7 F (37.1 C)] 98.4 F (36.9 C) (03/03 0416) Pulse Rate:  [52-76] 57 (03/03 1200) Cardiac Rhythm: Sinus bradycardia (03/03 1200) Resp:  [12-22] 14 (03/03 1200) BP: (95-144)/(56-74) 116/60 (03/03 1200) SpO2:  [92 %-100 %] 100 % (03/03 1200)  Recent Labs  Lab 05/08/17 1158 05/08/17 1648 05/08/17 2029 05/09/17 0851 05/09/17 1111  GLUCAP 185* 154* 153* 177* 195*   Recent Labs  Lab 05/06/17 0312 05/06/17 0329 05/07/17 0500 05/08/17 0615  NA 135 137 139 139  K 4.3 4.3 4.3 4.4  CL 101 103 109 110  CO2 22  --  22 20*  GLUCOSE 383* 366* 203* 173*  BUN 40* 37* 30* 33*  CREATININE 1.98* 1.80* 1.88* 1.89*  CALCIUM 9.1  --  8.2* 8.4*   Recent Labs  Lab 05/06/17 0312  AST 16  ALT 13*  ALKPHOS 100  BILITOT 0.4  PROT 6.6  ALBUMIN 3.1*   Recent Labs  Lab 05/06/17 0312 05/06/17 0329 05/07/17 0500 05/08/17 0615 05/08/17 1444  WBC 8.0  --  8.3 6.9  --   NEUTROABS 6.3  --   --   --   --   HGB 12.6* 13.3 10.7* 11.2* 11.8*  HCT 38.4* 39.0 33.4* 34.9* 36.5*  MCV 95.5  --  96.3 95.9  --   PLT 185  --  176 164  --    No results for input(s): CKTOTAL, CKMB, CKMBINDEX, TROPONINI in the last 168 hours. No results for input(s): LABPROT, INR in the last 72 hours. Recent Labs    05/08/17 1634  COLORURINE YELLOW  LABSPEC 1.017  PHURINE 5.0  GLUCOSEU NEGATIVE  HGBUR LARGE*  BILIRUBINUR NEGATIVE  KETONESUR NEGATIVE  PROTEINUR 30*  NITRITE NEGATIVE  LEUKOCYTESUR NEGATIVE       Component Value Date/Time   CHOL 173 05/07/2017 0500   TRIG 215 (H) 05/07/2017 0500   HDL 61 05/07/2017 0500   CHOLHDL 2.8 05/07/2017 0500   VLDL 43 (H) 05/07/2017 0500   LDLCALC 69 05/07/2017  0500   Lab Results  Component Value Date   HGBA1C 9.1 (H) 05/07/2017      Component Value Date/Time   LABOPIA NONE DETECTED 05/06/2017 1011   COCAINSCRNUR NONE DETECTED 05/06/2017 1011   LABBENZ NONE DETECTED 05/06/2017 1011   AMPHETMU NONE DETECTED 05/06/2017 1011   THCU NONE DETECTED 05/06/2017 1011   LABBARB NONE DETECTED 05/06/2017 1011    No results for input(s): ETH in the last 168 hours.  I have personally reviewed the radiological images below and agree with the radiology interpretations.  Ct Angio Head W Or Wo Contrast  Result Date: 05/06/2017 CLINICAL DATA:  77 y/o M; right-sided weakness, numbness, slurred speech. EXAM: CT ANGIOGRAPHY HEAD AND NECK TECHNIQUE: Multidetector CT imaging of the head and neck was performed using the standard protocol during bolus administration of intravenous contrast. Multiplanar CT image reconstructions and MIPs were obtained to evaluate the vascular anatomy. Carotid stenosis measurements (when applicable) are obtained utilizing NASCET criteria, using the distal internal carotid diameter as the denominator. CONTRAST:  45mL ISOVUE-370 IOPAMIDOL (ISOVUE-370) INJECTION 76% COMPARISON:  None. FINDINGS: CTA NECK FINDINGS Aortic arch: Standard branching. Imaged portion shows no evidence of aneurysm or dissection. No  significant stenosis of the major arch vessel origins. Right carotid system: No evidence of dissection, stenosis (50% or greater) or occlusion. Left carotid system: Patent left common carotid artery. Left ICA occlusion to the terminus where there is a short segment of reconstitution of the terminal ICA and proximal left M1 via the anterior communicating artery. Vertebral arteries: Codominant. No evidence of dissection, stenosis (50% or greater) or occlusion. Skeleton: Moderate cervical spine spondylosis with multilevel discogenic degenerative changes greatest at the C4 through C7 levels or uncovertebral and facet hypertrophy encroach on neural  foramen and there is mild canal stenosis. Other neck: Negative. Upper chest: Right port catheter in the SVC extending below field of view. Review of the MIP images confirms the above findings CTA HEAD FINDINGS Anterior circulation: Left mid M1 occlusion with poor left MCA distribution collateral circulation. Right distal M1 mild stenosis. No additional large vessel occlusion, aneurysm, or vascular malformation in the anterior circulation. Posterior circulation: No significant stenosis, proximal occlusion, aneurysm, or vascular malformation. Right P2 moderate stenosis and left P2 mild stenosis. Venous sinuses: As permitted by contrast timing, patent. Anatomic variants: Patent anterior and bilateral posterior communicating arteries. Review of the MIP images confirms the above findings IMPRESSION: 1. 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 Short segment of patency of left ICA terminus and proximal left M1 via anterior communicating artery. 2. Left mid M1 occlusion with poor left MCA collateralization. 3. Patent right carotid and bilateral vertebral systems. 4. Patent bilateral ACA, right MCA, and bilateral PCA distributions. Intracranial atherosclerosis with multiple segments of mild-to-moderate stenosis. These results were called by telephone at the time of interpretation on 05/06/2017 at 4:10 am to Dr. Cheral Marker, who verbally acknowledged these results. Electronically Signed   By: Kristine Garbe M.D.   On: 05/06/2017 04:17   Ct Angio Neck W And/or Wo Contrast  Result Date: 05/06/2017 CLINICAL DATA:  77 y/o M; right-sided weakness, numbness, slurred speech. EXAM: CT ANGIOGRAPHY HEAD AND NECK TECHNIQUE: Multidetector CT imaging of the head and neck was performed using the standard protocol during bolus administration of intravenous contrast. Multiplanar CT image reconstructions and MIPs were obtained to evaluate the vascular anatomy. Carotid stenosis measurements (when applicable) are obtained utilizing  NASCET criteria, using the distal internal carotid diameter as the denominator. CONTRAST:  2mL ISOVUE-370 IOPAMIDOL (ISOVUE-370) INJECTION 76% COMPARISON:  None. FINDINGS: CTA NECK FINDINGS Aortic arch: Standard branching. Imaged portion shows no evidence of aneurysm or dissection. No significant stenosis of the major arch vessel origins. Right carotid system: No evidence of dissection, stenosis (50% or greater) or occlusion. Left carotid system: Patent left common carotid artery. Left ICA occlusion to the terminus where there is a short segment of reconstitution of the terminal ICA and proximal left M1 via the anterior communicating artery. Vertebral arteries: Codominant. No evidence of dissection, stenosis (50% or greater) or occlusion. Skeleton: Moderate cervical spine spondylosis with multilevel discogenic degenerative changes greatest at the C4 through C7 levels or uncovertebral and facet hypertrophy encroach on neural foramen and there is mild canal stenosis. Other neck: Negative. Upper chest: Right port catheter in the SVC extending below field of view. Review of the MIP images confirms the above findings CTA HEAD FINDINGS Anterior circulation: Left mid M1 occlusion with poor left MCA distribution collateral circulation. Right distal M1 mild stenosis. No additional large vessel occlusion, aneurysm, or vascular malformation in the anterior circulation. Posterior circulation: No significant stenosis, proximal occlusion, aneurysm, or vascular malformation. Right P2 moderate stenosis and left P2 mild stenosis. Venous  sinuses: As permitted by contrast timing, patent. Anatomic variants: Patent anterior and bilateral posterior communicating arteries. Review of the MIP images confirms the above findings IMPRESSION: 1. 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 Short segment of patency of left ICA terminus and proximal left M1 via anterior communicating artery. 2. Left mid M1 occlusion with poor left MCA  collateralization. 3. Patent right carotid and bilateral vertebral systems. 4. Patent bilateral ACA, right MCA, and bilateral PCA distributions. Intracranial atherosclerosis with multiple segments of mild-to-moderate stenosis. These results were called by telephone at the time of interpretation on 05/06/2017 at 4:10 am to Dr. Cheral Marker, who verbally acknowledged these results. Electronically Signed   By: Kristine Garbe M.D.   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 Personal history of prior whole brain radiation for cancer, but no known brain metastases. EXAM: MRI HEAD WITHOUT CONTRAST MRA HEAD WITHOUT CONTRAST TECHNIQUE: Multiplanar, multiecho pulse sequences of the brain and surrounding structures were obtained without intravenous contrast. Angiographic images of the head were obtained using MRA technique without contrast. COMPARISON:  Cerebral angiogram, CTA head and neck, and noncontrast head CT earlier today. Prior brain MRI 11/19/2016. FINDINGS: MRI HEAD FINDINGS Brain: Restricted diffusion in the left basal ganglia (series 3, image 32) with T2 and FLAIR hyperintensity. No associated hemorrhage. No significant mass effect. There is a small 9 millimeter area of restricted diffusion in the medial left temporal lobe at the amygdala (series 3, image 20). There are otherwise a small number of tiny scattered foci of restricted diffusion in the left MCA territory (such as series 3, image 40). There is no right hemisphere or posterior circulation restricted diffusion. Patchy and scattered bilateral cerebral white matter T2 and FLAIR hyperintensity elsewhere is chronic and stable. The right deep gray matter nuclei, left thalamus, brainstem, and cerebellum remain normal for age. No midline shift, mass effect, evidence of mass  lesion, ventriculomegaly, extra-axial collection or acute intracranial hemorrhage. Cervicomedullary junction and pituitary are within normal limits. Vascular: Major intracranial vascular flow voids are preserved, the left ICA flow void was abnormal in 2018 and appears mildly improved today. Negative visible cervical spine.  Normal bone marrow signal. There is mild leftward gaze deviation. Orbits soft tissues otherwise appear normal. Paranasal Visualized paranasal sinuses and mastoids are stable and well pneumatized. Scalp and face soft tissues appear negative. MRA HEAD FINDINGS Antegrade flow in the posterior circulation with codominant distal vertebral arteries. Mild distal vertebral irregularity with no stenosis. Patent vertebrobasilar junction without stenosis. Dominant appearing AICA origins are patent. Mild basilar artery irregularity without stenosis. SCA origins are patent. Bilateral PCA origins appear normal. Posterior communicating arteries are diminutive or absent. Bilateral PCA branches are mildly irregular, and there is mild right PCA P2 segment stenosis which is stable from earlier today. Antegrade flow in both ICA siphons. Mild luminal narrowing and irregularity throughout the visible left ICA may reflect residual thrombus or plaque. The left ICA is patent to the left terminus. The left MCA and ACA origins are patent. There is mild to moderate irregularity at the left ACA origin and proximal A1 segment which is stable from earlier today. There is mild irregularity scratched at the left MCA and its branches now are patent. There is mild left M1 irregularity. The left MCA bifurcation is patent with no branch occlusion identified. Antegrade flow in the  right ICA siphon which is only mildly irregular and without stenosis. Normal right ophthalmic artery origin. Normal right ICA terminus, right ACA and MCA origins. The anterior communicating artery is normal. The visible ACA branches are stable, with mild  irregularity of the distal left ACA. The right MCA M1 segment is patent with mild irregularity just proximal to the bifurcation which is stable. The right MCA branches are stable and within normal limits. IMPRESSION: 1. Restored patency of the Left ICA, terminus, and Left MCA since earlier today. Mild residual irregularity throughout the left ICA siphon, and in the left M1 segment. Stable mild to moderate irregularity in the left A1 and distal A2 segments. 2. Associated core infarct primarily confined to the Left Basal Ganglia. There is a small 9 mm infarct in the left amygdala, and there are small number of scattered tiny cortical infarcts elsewhere in the Left MCA territory. 3. Mild cytotoxic edema with no associated hemorrhage or mass effect. 4. 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 Electronically Signed   By: Genevie Ann M.D.   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 Personal history of prior whole brain radiation for cancer, but no known brain metastases. EXAM: MRI HEAD WITHOUT CONTRAST MRA HEAD WITHOUT CONTRAST TECHNIQUE: Multiplanar, multiecho pulse sequences of the brain and surrounding structures were obtained without intravenous contrast. Angiographic images of the head were obtained using MRA technique without contrast. COMPARISON:  Cerebral angiogram, CTA head and neck, and noncontrast head CT earlier today. Prior brain MRI 11/19/2016. FINDINGS: MRI HEAD FINDINGS Brain: Restricted diffusion in the left basal ganglia (series 3, image 32) with T2 and FLAIR hyperintensity. No associated hemorrhage. No significant mass effect. There is a small 9 millimeter area of restricted diffusion in the medial left temporal lobe at the amygdala (series 3, image 20). There are  otherwise a small number of tiny scattered foci of restricted diffusion in the left MCA territory (such as series 3, image 40). There is no right hemisphere or posterior circulation restricted diffusion. Patchy and scattered bilateral cerebral white matter T2 and FLAIR hyperintensity elsewhere is chronic and stable. The right deep gray matter nuclei, left thalamus, brainstem, and cerebellum remain normal for age. No midline shift, mass effect, evidence of mass lesion, ventriculomegaly, extra-axial collection or acute intracranial hemorrhage. Cervicomedullary junction and pituitary are within normal limits. Vascular: Major intracranial vascular flow voids are preserved, the left ICA flow void was abnormal in 2018 and appears mildly improved today. Negative visible cervical spine.  Normal bone marrow signal. There is mild leftward gaze deviation. Orbits soft tissues otherwise appear normal. Paranasal Visualized paranasal sinuses and mastoids are stable and well pneumatized. Scalp and face soft tissues appear negative. MRA HEAD FINDINGS Antegrade flow in the posterior circulation with codominant distal vertebral arteries. Mild distal vertebral irregularity with no stenosis. Patent vertebrobasilar junction without stenosis. Dominant appearing AICA origins are patent. Mild basilar artery irregularity without stenosis. SCA origins are patent. Bilateral PCA origins appear normal. Posterior communicating arteries are diminutive or absent. Bilateral PCA branches are mildly irregular, and there is mild right PCA P2 segment stenosis which is stable from earlier today. Antegrade flow in both ICA siphons. Mild luminal narrowing and irregularity throughout the visible left ICA may reflect residual thrombus or plaque. The left ICA is patent to the  left terminus. The left MCA and ACA origins are patent. There is mild to moderate irregularity at the left ACA origin and proximal A1 segment which is stable from earlier today. There is  mild irregularity scratched at the left MCA and its branches now are patent. There is mild left M1 irregularity. The left MCA bifurcation is patent with no branch occlusion identified. Antegrade flow in the right ICA siphon which is only mildly irregular and without stenosis. Normal right ophthalmic artery origin. Normal right ICA terminus, right ACA and MCA origins. The anterior communicating artery is normal. The visible ACA branches are stable, with mild irregularity of the distal left ACA. The right MCA M1 segment is patent with mild irregularity just proximal to the bifurcation which is stable. The right MCA branches are stable and within normal limits. IMPRESSION: 1. Restored patency of the Left ICA, terminus, and Left MCA since earlier today. Mild residual irregularity throughout the left ICA siphon, and in the left M1 segment. Stable mild to moderate irregularity in the left A1 and distal A2 segments. 2. Associated core infarct primarily confined to the Left Basal Ganglia. There is a small 9 mm infarct in the left amygdala, and there are small number of scattered tiny cortical infarcts elsewhere in the Left MCA territory. 3. Mild cytotoxic edema with no associated hemorrhage or mass effect. 4. 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 Electronically Signed   By: Genevie Ann M.D.   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 [employer] health stroke scale of 8 with aspects score 10 EXAM: ULTRASOUND GUIDED ACCESS RIGHT COMMON FEMORAL ARTERY ULTRASOUND GUIDED ACCESS LEFT COMMON FEMORAL ARTERY FOR ARTERIAL MONITORING ULTRASOUND GUIDED ACCESS RIGHT COMMON FEMORAL VEIN FOR INTRA PROCEDURAL IV ACCESS CEREBRAL ANGIOGRAM REVASCULARIZATION OF ACUTE OCCLUSION  LEFT ICA WITH BALLOON ANGIOPLASTY AND ACUTE CAROTID STENTING WITHOUT EMBOLIC PROTECTION MECHANICAL THROMBECTOMY OF EMERGENT LARGE VESSEL OCCLUSION OF LEFT M1 SEGMENT DEPLOYMENT OF 8 FRENCH ANGIO-SEAL FOR RIGHT COMMON FEMORAL ARTERY HEMOSTASIS COMPARISON:  CT AND CT ANGIOGRAM 05/06/2017 MEDICATIONS: Vancomycin 1 gm IV. The antibiotic was administered within 1 hour of the procedure 75 MCG NITROGLYCERIN INTRA ARTERIAL 650 mg aspirin via orogastric tube. Intra procedural initiation of Integrilin IV drip for dual anti-platelet therapy. ANESTHESIA/SEDATION: General endotracheal tube anesthesia with anesthesia team CONTRAST:  144 cc IV contrast FLUOROSCOPY TIME:  Fluoroscopy Time: 54 minutes 6 seconds (2,926 mGy). COMPLICATIONS: None TECHNIQUE: Informed written consent was obtained from the patient's family after a thorough discussion of the procedural risks, benefits and alternatives. Specific risks discussed include: Bleeding, infection, contrast reaction, kidney injury/failure, need for further procedure/surgery, arterial injury or dissection, embolization to new territory, intracranial hemorrhage (10-15% risk), neurologic deterioration, cardiopulmonary collapse, death. All questions were addressed. Maximal Sterile Barrier Technique was utilized including during the procedure including caps, mask, sterile gowns, sterile gloves, sterile drape, hand hygiene and skin antiseptic. A timeout was performed prior to the initiation of the procedure. FINDINGS: Initial angiogram: Left common carotid artery:  Normal course caliber and contour. Left external carotid artery: Patent with antegrade flow. On the initial injection before treatment, there is minimal opacification of the carotid siphon secondary to meningeal branches which appear to be region ating from ascending pharyngeal distribution and potentially deep meningeal branches from the external carotid artery.  No significant filling of the MCA on the initial injection The  initial injection does confirmed perfusion of the lenticulostriate vasculature, as well as a patent anterior coronal artery, with minimal filling of temporal lobe. Anterior basal ganglia structures are perfused. Left internal carotid artery: Internal carotid artery is occluded at the origin with a string sign at the posterior aspect of the artery. Left MCA: Initial injection demonstrates no significant filling of the middle cerebral artery. 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 Left ACA: Anterior cerebral artery perfused by the right-sided flow at the initiation. Final angiogram: After treatment of the carotid occlusion and deployment of 8 mm-6 mm x 40 mm carotid stent, there is excellent flow through the cervical carotid to the intracranial carotid. After mechanical thrombectomy of M1 occlusion, there is modified treatment in cerebral ischemia of 3. Flat panel CT: No complications with small contrast stain of the left basal ganglia. PROCEDURE: Patient was brought to the neurointerventional suite, with the patient identity confirmed, and consent form confirmed. General anesthesia was present for initiation of general endotracheal tube anesthesia. Patient was prepped and draped in the usual sterile fashion. The only venous access was a port catheter with single needle access. Ultrasound survey of the right inguinal region was performed with images stored and sent to PACs. 11 blade scalpel was used to make a small incision. A micropuncture needle was used access the right common femoral artery under ultrasound. With excellent arterial blood flow returned, an .018 micro wire was passed through the needle, observed to enter the abdominal aorta under fluoroscopy. The needle was removed, and a micropuncture sheath was placed over the wire. The inner dilator and wire were removed, and an 035 Bentson wire was advanced under fluoroscopy into the abdominal  aorta. The sheath was removed and a standard 5 [place] vascular sheath was placed. The dilator was removed and the sheath was flushed. After the initial common femoral artery access, ultrasound survey of the left inguinal region was performed in order to place arterial monitoring line. Images stored and sent to PACs. Micropuncture needle was used access the left common femoral artery under ultrasound. With excellent arterial blood flow returned, an .018 micro wire was passed through the needle, observed to enter the abdominal aorta under fluoroscopy. The needle was removed, and a micropuncture sheath was placed over the wire. The inner dilator and wire were removed, and an 035 Bentson wire was advanced under fluoroscopy into the abdominal aorta. The sheath was removed and a standard 4 [place] vascular sheath was placed. The dilator was removed and the sheath was flushed. A 8F JB-1 diagnostic catheter was advanced over the wire through the right common femoral artery access to the proximal descending thoracic aorta. Wire was then removed. Double flush of the catheter was performed. Catheter was then used to select the left common carotid artery. Angiogram was performed. Using roadmap technique, the catheter was advanced over a roadrunner wire into right common carotid artery. Formal angiogram was performed. Catheter was advanced over the roadrunner wire into the external carotid artery. Wire was removed. Exchange length Rosen wire was then passed through the diagnostic catheter to the distal common carotid artery and the diagnostic catheter was removed. The 5 French sheath was removed and exchanged for 8 French 55 centimeter BrightTip sheath. Sheath was flushed and attached to pressurized and heparinized saline bag for constant forward flow. Then an 8 [place], 95 cm Flowgate balloon tip catheter was  prepared on the back table with inflation of the balloon with 50/50 concentration of dilute contrast. The balloon catheter  was then advanced over the wire, positioned into the distal common carotid artery. Copious back flush was performed and the balloon catheter was attached to heparinized and pressurized saline bag for forward flow. Roadmap was then performed at the carotid bifurcation. Combination of a rapid transit microwire and a synchro soft wire were used to navigate beyond the occlusion at the proximal internal carotid artery. The microcatheter was then navigated into the distal cervical segment, wire was removed, blood was aspirated, and a gentle injection confirmed a luminal position. Exchange length Transcend wire was then placed through the microcatheter into the cervical carotid. Microcatheter was removed. Balloon angioplasty was then performed with a rapid exchange system, using a Viatrac balloon 5 mm x 30 mm. The patient required treatment at this time with atropine dose, as we experienced bradycardia into the 30s. His heart rate recovered after treatment by the anesthesia team. Balloon was removed. Angiogram confirmed flow into the cervical carotid. A coaxial system of an Ace 64 penumbra aspiration catheter and the Trevo pro view 18 catheter was then advanced over the exchange length wire into the cervical carotid. Once the coaxial system was in the cervical carotid, the microwire was removed and exchanged for synchro soft. The synchro soft wire was navigated beyond the occlusion under roadmap guidance, and the aspiration catheter was advanced into the ICA terminus. The balloon guide catheter was then advanced into the mid segment of the cervical carotid artery. The microcatheter and microwire were then carefully advanced through the occluded segment. Microcatheter was then push through the occluded segment and the wire was removed. Blood was then aspirated through the hub of the microcatheter, and a gentle contrast injection was performed confirming intraluminal position. A rotating hemostatic valve was then attached to  the back end of the microcatheter, and a pressurized and heparinized saline bag was attached to the catheter. 4 x 40 solitaire device was then selected. Back flush was achieved at the rotating hemostatic valve, and then the device was gently advanced through the microcatheter to the distal end. The retriever was then unsheathed by withdrawing the microcatheter under fluoroscopy. A 5 minutes interval was observed. At this time, ultrasound-guided access of the right common femoral vein was performed for placement of a additional venous access and placement of a 12 French triple-lumen IV catheter. We then proceeded with the mechanical thrombectomy. The balloon at the balloon tip catheter was then inflated under fluoroscopy for proximal flow arrest. Constant aspiration was then performed at the penumbra aspiration catheter at the carotid terminus as the retriever was gently and slowly withdrawn with fluoroscopic observation. Once the retriever was entirely removed from the system, free aspiration was confirmed at the hub of the aspiration catheter, with free blood return confirmed. The balloon was then deflated, rotating hemostatic valve was reattached, and a control angiogram was performed, confirming restoration of flow. Penumbra catheter was then withdrawn, and the angiogram was performed at the carotid bifurcation. Given the appearance, we elected to place acute carotid stent. The lesion was then again navigated with synchro soft wire and rapid transit. With the microwire microcatheter positioned in the distal cervical carotid, the microwire was removed. The exchange length Transcend wire was then replaced and the microcatheter was removed. Acute carotid stent was then deployed, 8 mm-6 mm x 40 mm length. Before deployment, we initiated Integrilin drip treatment for dual anti-platelet therapy. Angiogram was performed.  Coaxial system of the penumbra aspiration catheter and the pro view 18 catheter were then navigated  to the carotid siphon for better angiogram of the cerebral vasculature. Angiogram was performed. Microcatheter, a number aspiration catheter were withdrawn. The balloon guide catheter was positioned in the common carotid artery for final angiogram. All catheter and wires were removed. The sheath at the right common femoral artery access was withdrawn for an angiogram. Eight French Angio-Seal was then deployed for hemostasis. A flat panel CT was then performed.  Patient was then extubated. Estimated blood loss was 50 cc No complications encountered. IMPRESSION: Status post left-sided cerebral angiogram with treatment of left ICA/MCA tandem occlusion, requiring acute left carotid stenting and mechanical thrombectomy of a left M1 occlusion with achievement of mTICI-3 flow, and restoration of cervical ICA flow. Flat panel CT in the room demonstrates small contrast stain without complicating features, and the patient was extubated. Status post deployment of 8 French Angio-Seal for right common femoral artery hemostasis. Status post ultrasound-guided left common femoral artery access with a 4 French sheath for arterial monitoring, which remained after the case. Status post ultrasound-guided right common femoral vein access for additional IV access, which remained after the case transmitting the Integrilin drip. Signed, Dulcy Fanny. Earleen Newport, DO [facility] Radiology PLAN: Patient will go to the PACU ICU admission Bilateral hips will be straight until the left 4 French arterial monitoring sheath is removed and the right common femoral vein venous access sheath is removed. Routine wound management for the right common femoral artery sheath access. Frequent neurovascular checks. IV saline hydration for 8 hours given the renal insufficiency Blood pressure control management of 120-140 with nicardipine drip ordered The goal will be dual anti-platelet therapy, for now with dose of 650  mg aspirin and the Integrilin drip. Electronically Signed   By: Corrie Mckusick D.O.   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 The antibiotic was administered within 1 hour of the procedure 75 MCG NITROGLYCERIN INTRA ARTERIAL 650 mg aspirin via orogastric tube. Intra procedural initiation of Integrilin IV drip for dual anti-platelet therapy. ANESTHESIA/SEDATION: General endotracheal tube anesthesia with anesthesia team CONTRAST:  144 cc IV contrast FLUOROSCOPY TIME:  Fluoroscopy Time: 54 minutes 6 seconds (2,926 mGy). COMPLICATIONS: None TECHNIQUE: Informed written consent was obtained from the patient's family after a thorough discussion of the procedural risks, benefits and alternatives. Specific risks discussed include: Bleeding, infection, contrast reaction, kidney injury/failure, need for further procedure/surgery, arterial injury or dissection, embolization to new territory, intracranial hemorrhage (10-15% risk), neurologic deterioration, cardiopulmonary collapse, death. All questions were addressed. Maximal Sterile Barrier Technique was  utilized including during the procedure including caps, mask, sterile gowns, sterile gloves, sterile drape, hand hygiene and skin antiseptic. A  timeout was performed prior to the initiation of the procedure. FINDINGS: Initial angiogram: Left common carotid artery:  Normal course caliber and contour. Left external carotid artery: Patent with antegrade flow. On the initial injection before treatment, there is minimal opacification of the carotid siphon secondary to meningeal branches which appear to be region ating from ascending pharyngeal distribution and potentially deep meningeal branches from the external carotid artery. No significant filling of the MCA on the initial injection The initial injection does confirmed perfusion of the lenticulostriate vasculature, as well as a patent anterior coronal artery, with minimal filling of temporal lobe. Anterior basal ganglia structures are perfused. Left internal carotid artery: Internal carotid artery is occluded at the origin with a string sign at the posterior aspect of the artery. Left MCA: Initial injection demonstrates no significant filling of the middle cerebral artery. 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 Left ACA: Anterior cerebral artery perfused by the right-sided flow at the initiation. Final angiogram: After treatment of the carotid occlusion and deployment of 8 mm-6 mm x 40 mm carotid stent, there is excellent flow through the cervical carotid to the intracranial carotid. After mechanical thrombectomy of M1 occlusion, there is modified treatment in cerebral ischemia of 3. Flat panel CT: No complications with small contrast stain of the left basal ganglia. PROCEDURE: Patient was brought to the neurointerventional suite, with the patient identity confirmed, and consent form confirmed. General anesthesia was present for initiation of general endotracheal tube anesthesia. Patient was prepped and  draped in the usual sterile fashion. The only venous access was a port catheter with single needle access. Ultrasound survey of the right inguinal region was performed with images stored and sent to PACs. 11 blade scalpel was used to make a small incision. A micropuncture needle was used access the right common femoral artery under ultrasound. With excellent arterial blood flow returned, an .018 micro wire was passed through the needle, observed to enter the abdominal aorta under fluoroscopy. The needle was removed, and a micropuncture sheath was placed over the wire. The inner dilator and wire were removed, and an 035 Bentson wire was advanced under fluoroscopy into the abdominal aorta. The sheath was removed and a standard 5 [place] vascular sheath was placed. The dilator was removed and the sheath was flushed. After the initial common femoral artery access, ultrasound survey of the left inguinal region was performed in order to place arterial monitoring line. Images stored and sent to PACs. Micropuncture needle was used access the left common femoral artery under ultrasound. With excellent arterial blood flow returned, an .018 micro wire was passed through the needle, observed to enter the abdominal aorta under fluoroscopy. The needle was removed, and a micropuncture sheath was placed over the wire. The inner dilator and wire were removed, and an 035 Bentson wire was advanced under fluoroscopy into the abdominal aorta. The sheath was removed and a standard 4 [place] vascular sheath was placed. The dilator was removed and the sheath was flushed. A 51F JB-1 diagnostic catheter was advanced over the wire through the right common femoral artery access to the proximal descending thoracic aorta. Wire was then removed. Double flush of the catheter was performed. Catheter was then used to select the left common carotid artery. Angiogram was performed. Using roadmap technique, the catheter was advanced over a roadrunner wire  into right common carotid artery. Formal angiogram was performed. Catheter was advanced over the roadrunner wire into the external  carotid artery. Wire was removed. Exchange length Rosen wire was then passed through the diagnostic catheter to the distal common carotid artery and the diagnostic catheter was removed. The 5 French sheath was removed and exchanged for 8 French 55 centimeter BrightTip sheath. Sheath was flushed and attached to pressurized and heparinized saline bag for constant forward flow. Then an 8 [place], 95 cm Flowgate balloon tip catheter was prepared on the back table with inflation of the balloon with 50/50 concentration of dilute contrast. The balloon catheter was then advanced over the wire, positioned into the distal common carotid artery. Copious back flush was performed and the balloon catheter was attached to heparinized and pressurized saline bag for forward flow. Roadmap was then performed at the carotid bifurcation. Combination of a rapid transit microwire and a synchro soft wire were used to navigate beyond the occlusion at the proximal internal carotid artery. The microcatheter was then navigated into the distal cervical segment, wire was removed, blood was aspirated, and a gentle injection confirmed a luminal position. Exchange length Transcend wire was then placed through the microcatheter into the cervical carotid. Microcatheter was removed. Balloon angioplasty was then performed with a rapid exchange system, using a Viatrac balloon 5 mm x 30 mm. The patient required treatment at this time with atropine dose, as we experienced bradycardia into the 30s. His heart rate recovered after treatment by the anesthesia team. Balloon was removed. Angiogram confirmed flow into the cervical carotid. A coaxial system of an Ace 64 penumbra aspiration catheter and the Trevo pro view 18 catheter was then advanced over the exchange length wire into the cervical carotid. Once the coaxial system was  in the cervical carotid, the microwire was removed and exchanged for synchro soft. The synchro soft wire was navigated beyond the occlusion under roadmap guidance, and the aspiration catheter was advanced into the ICA terminus. The balloon guide catheter was then advanced into the mid segment of the cervical carotid artery. The microcatheter and microwire were then carefully advanced through the occluded segment. Microcatheter was then push through the occluded segment and the wire was removed. Blood was then aspirated through the hub of the microcatheter, and a gentle contrast injection was performed confirming intraluminal position. A rotating hemostatic valve was then attached to the back end of the microcatheter, and a pressurized and heparinized saline bag was attached to the catheter. 4 x 40 solitaire device was then selected. Back flush was achieved at the rotating hemostatic valve, and then the device was gently advanced through the microcatheter to the distal end. The retriever was then unsheathed by withdrawing the microcatheter under fluoroscopy. A 5 minutes interval was observed. At this time, ultrasound-guided access of the right common femoral vein was performed for placement of a additional venous access and placement of a 12 French triple-lumen IV catheter. We then proceeded with the mechanical thrombectomy. The balloon at the balloon tip catheter was then inflated under fluoroscopy for proximal flow arrest. Constant aspiration was then performed at the penumbra aspiration catheter at the carotid terminus as the retriever was gently and slowly withdrawn with fluoroscopic observation. Once the retriever was entirely removed from the system, free aspiration was confirmed at the hub of the aspiration catheter, with free blood return confirmed. The balloon was then deflated, rotating hemostatic valve was reattached, and a control angiogram was performed, confirming restoration of flow. Penumbra catheter  was then withdrawn, and the angiogram was performed at the carotid bifurcation. Given the appearance, we elected to place acute  carotid stent. The lesion was then again navigated with synchro soft wire and rapid transit. With the microwire microcatheter positioned in the distal cervical carotid, the microwire was removed. The exchange length Transcend wire was then replaced and the microcatheter was removed. Acute carotid stent was then deployed, 8 mm-6 mm x 40 mm length. Before deployment, we initiated Integrilin drip treatment for dual anti-platelet therapy. Angiogram was performed. Coaxial system of the penumbra aspiration catheter and the pro view 18 catheter were then navigated to the carotid siphon for better angiogram of the cerebral vasculature. Angiogram was performed. Microcatheter, a number aspiration catheter were withdrawn. The balloon guide catheter was positioned in the common carotid artery for final angiogram. All catheter and wires were removed. The sheath at the right common femoral artery access was withdrawn for an angiogram. Eight French Angio-Seal was then deployed for hemostasis. A flat panel CT was then performed.  Patient was then extubated. Estimated blood loss was 50 cc No complications encountered. IMPRESSION: Status post left-sided cerebral angiogram with treatment of left ICA/MCA tandem occlusion, requiring acute left carotid stenting and mechanical thrombectomy of a left M1 occlusion with achievement of mTICI-3 flow, and restoration of cervical ICA flow. Flat panel CT in the room demonstrates small contrast stain without complicating features, and the patient was extubated. Status post deployment of 8 French Angio-Seal for right common femoral artery hemostasis. Status post ultrasound-guided left common femoral artery access with a 4 French sheath for arterial monitoring, which remained after the case. Status post ultrasound-guided right common femoral vein access for additional IV  access, which remained after the case transmitting the Integrilin drip. Signed, Dulcy Fanny. Earleen Newport, DO [facility] Radiology PLAN: Patient will go to the PACU ICU admission Bilateral hips will be straight until the left 4 French arterial monitoring sheath is removed and the right common femoral vein venous access sheath is removed. Routine wound management for the right common femoral artery sheath access. Frequent neurovascular checks. IV saline hydration for 8 hours given the renal insufficiency Blood pressure control management of 120-140 with nicardipine drip ordered The goal will be dual anti-platelet therapy, for now with dose of 650 mg aspirin and the Integrilin drip. Electronically Signed   By: Corrie Mckusick D.O.   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 The antibiotic was administered within 1 hour of the procedure 75 MCG NITROGLYCERIN INTRA ARTERIAL 650 mg aspirin via orogastric tube. Intra procedural initiation of Integrilin IV drip for dual anti-platelet therapy. ANESTHESIA/SEDATION: General endotracheal tube  anesthesia with anesthesia team CONTRAST:  144 cc IV contrast FLUOROSCOPY TIME:  Fluoroscopy Time: 54 minutes 6 seconds (2,926 mGy). COMPLICATIONS: None TECHNIQUE: Informed written consent was obtained from the patient's family after a thorough discussion of the  procedural risks, benefits and alternatives. Specific risks discussed include: Bleeding, infection, contrast reaction, kidney injury/failure, need for further procedure/surgery, arterial injury or dissection, embolization to new territory, intracranial hemorrhage (10-15% risk), neurologic deterioration, cardiopulmonary collapse, death. All questions were addressed. Maximal Sterile Barrier Technique was utilized including during the procedure including caps, mask, sterile gowns, sterile gloves, sterile drape, hand hygiene and skin antiseptic. A timeout was performed prior to the initiation of the procedure. FINDINGS: Initial angiogram: Left common carotid artery:  Normal course caliber and contour. Left external carotid artery: Patent with antegrade flow. On the initial injection before treatment, there is minimal opacification of the carotid siphon secondary to meningeal branches which appear to be region ating from ascending pharyngeal distribution and potentially deep meningeal branches from the external carotid artery. No significant filling of the MCA on the initial injection The initial injection does confirmed perfusion of the lenticulostriate vasculature, as well as a patent anterior coronal artery, with minimal filling of temporal lobe. Anterior basal ganglia structures are perfused. Left internal carotid artery: Internal carotid artery is occluded at the origin with a string sign at the posterior aspect of the artery. Left MCA: Initial injection demonstrates no significant filling of the middle cerebral artery. 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 Left ACA: Anterior cerebral artery  perfused by the right-sided flow at the initiation. Final angiogram: After treatment of the carotid occlusion and deployment of 8 mm-6 mm x 40 mm carotid stent, there is excellent flow through the cervical carotid to the intracranial carotid. After mechanical thrombectomy of M1 occlusion, there is modified treatment in cerebral ischemia of 3. Flat panel CT: No complications with small contrast stain of the left basal ganglia. PROCEDURE: Patient was brought to the neurointerventional suite, with the patient identity confirmed, and consent form confirmed. General anesthesia was present for initiation of general endotracheal tube anesthesia. Patient was prepped and draped in the usual sterile fashion. The only venous access was a port catheter with single needle access. Ultrasound survey of the right inguinal region was performed with images stored and sent to PACs. 11 blade scalpel was used to make a small incision. A micropuncture needle was used access the right common femoral artery under ultrasound. With excellent arterial blood flow returned, an .018 micro wire was passed through the needle, observed to enter the abdominal aorta under fluoroscopy. The needle was removed, and a micropuncture sheath was placed over the wire. The inner dilator and wire were removed, and an 035 Bentson wire was advanced under fluoroscopy into the abdominal aorta. The sheath was removed and a standard 5 [place] vascular sheath was placed. The dilator was removed and the sheath was flushed. After the initial common femoral artery access, ultrasound survey of the left inguinal region was performed in order to place arterial monitoring line. Images stored and sent to PACs. Micropuncture needle was used access the left common femoral artery under ultrasound. With excellent arterial blood flow returned, an .018 micro wire was passed through the needle, observed to enter the abdominal aorta under fluoroscopy. The needle was removed, and a  micropuncture sheath was placed over the wire. The inner dilator and wire were removed, and an 035 Bentson wire was advanced under fluoroscopy into the abdominal aorta. The sheath was removed and a standard 4 [place] vascular sheath was placed. The dilator was removed and the sheath was flushed. A 41F JB-1 diagnostic catheter was advanced over the wire through the right  common femoral artery access to the proximal descending thoracic aorta. Wire was then removed. Double flush of the catheter was performed. Catheter was then used to select the left common carotid artery. Angiogram was performed. Using roadmap technique, the catheter was advanced over a roadrunner wire into right common carotid artery. Formal angiogram was performed. Catheter was advanced over the roadrunner wire into the external carotid artery. Wire was removed. Exchange length Rosen wire was then passed through the diagnostic catheter to the distal common carotid artery and the diagnostic catheter was removed. The 5 French sheath was removed and exchanged for 8 French 55 centimeter BrightTip sheath. Sheath was flushed and attached to pressurized and heparinized saline bag for constant forward flow. Then an 8 [place], 95 cm Flowgate balloon tip catheter was prepared on the back table with inflation of the balloon with 50/50 concentration of dilute contrast. The balloon catheter was then advanced over the wire, positioned into the distal common carotid artery. Copious back flush was performed and the balloon catheter was attached to heparinized and pressurized saline bag for forward flow. Roadmap was then performed at the carotid bifurcation. Combination of a rapid transit microwire and a synchro soft wire were used to navigate beyond the occlusion at the proximal internal carotid artery. The microcatheter was then navigated into the distal cervical segment, wire was removed, blood was aspirated, and a gentle injection confirmed a luminal position.  Exchange length Transcend wire was then placed through the microcatheter into the cervical carotid. Microcatheter was removed. Balloon angioplasty was then performed with a rapid exchange system, using a Viatrac balloon 5 mm x 30 mm. The patient required treatment at this time with atropine dose, as we experienced bradycardia into the 30s. His heart rate recovered after treatment by the anesthesia team. Balloon was removed. Angiogram confirmed flow into the cervical carotid. A coaxial system of an Ace 64 penumbra aspiration catheter and the Trevo pro view 18 catheter was then advanced over the exchange length wire into the cervical carotid. Once the coaxial system was in the cervical carotid, the microwire was removed and exchanged for synchro soft. The synchro soft wire was navigated beyond the occlusion under roadmap guidance, and the aspiration catheter was advanced into the ICA terminus. The balloon guide catheter was then advanced into the mid segment of the cervical carotid artery. The microcatheter and microwire were then carefully advanced through the occluded segment. Microcatheter was then push through the occluded segment and the wire was removed. Blood was then aspirated through the hub of the microcatheter, and a gentle contrast injection was performed confirming intraluminal position. A rotating hemostatic valve was then attached to the back end of the microcatheter, and a pressurized and heparinized saline bag was attached to the catheter. 4 x 40 solitaire device was then selected. Back flush was achieved at the rotating hemostatic valve, and then the device was gently advanced through the microcatheter to the distal end. The retriever was then unsheathed by withdrawing the microcatheter under fluoroscopy. A 5 minutes interval was observed. At this time, ultrasound-guided access of the right common femoral vein was performed for placement of a additional venous access and placement of a 12 French  triple-lumen IV catheter. We then proceeded with the mechanical thrombectomy. The balloon at the balloon tip catheter was then inflated under fluoroscopy for proximal flow arrest. Constant aspiration was then performed at the penumbra aspiration catheter at the carotid terminus as the retriever was gently and slowly withdrawn with fluoroscopic observation. Once the  retriever was entirely removed from the system, free aspiration was confirmed at the hub of the aspiration catheter, with free blood return confirmed. The balloon was then deflated, rotating hemostatic valve was reattached, and a control angiogram was performed, confirming restoration of flow. Penumbra catheter was then withdrawn, and the angiogram was performed at the carotid bifurcation. Given the appearance, we elected to place acute carotid stent. The lesion was then again navigated with synchro soft wire and rapid transit. With the microwire microcatheter positioned in the distal cervical carotid, the microwire was removed. The exchange length Transcend wire was then replaced and the microcatheter was removed. Acute carotid stent was then deployed, 8 mm-6 mm x 40 mm length. Before deployment, we initiated Integrilin drip treatment for dual anti-platelet therapy. Angiogram was performed. Coaxial system of the penumbra aspiration catheter and the pro view 18 catheter were then navigated to the carotid siphon for better angiogram of the cerebral vasculature. Angiogram was performed. Microcatheter, a number aspiration catheter were withdrawn. The balloon guide catheter was positioned in the common carotid artery for final angiogram. All catheter and wires were removed. The sheath at the right common femoral artery access was withdrawn for an angiogram. Eight French Angio-Seal was then deployed for hemostasis. A flat panel CT was then performed.  Patient was then extubated. Estimated blood loss was 50 cc No complications encountered. IMPRESSION: Status  post left-sided cerebral angiogram with treatment of left ICA/MCA tandem occlusion, requiring acute left carotid stenting and mechanical thrombectomy of a left M1 occlusion with achievement of mTICI-3 flow, and restoration of cervical ICA flow. Flat panel CT in the room demonstrates small contrast stain without complicating features, and the patient was extubated. Status post deployment of 8 French Angio-Seal for right common femoral artery hemostasis. Status post ultrasound-guided left common femoral artery access with a 4 French sheath for arterial monitoring, which remained after the case. Status post ultrasound-guided right common femoral vein access for additional IV access, which remained after the case transmitting the Integrilin drip. Signed, Dulcy Fanny. Earleen Newport, DO [facility] Radiology PLAN: Patient will go to the PACU ICU admission Bilateral hips will be straight until the left 4 French arterial monitoring sheath is removed and the right common femoral vein venous access sheath is removed. Routine wound management for the right common femoral artery sheath access. Frequent neurovascular checks. IV saline hydration for 8 hours given the renal insufficiency Blood pressure control management of 120-140 with nicardipine drip ordered The goal will be dual anti-platelet therapy, for now with dose of 650 mg aspirin and the Integrilin drip. Electronically Signed   By: Corrie Mckusick D.O.   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 The antibiotic was administered within 1 hour of the procedure  75 MCG NITROGLYCERIN INTRA ARTERIAL 650 mg aspirin via orogastric tube. Intra procedural initiation of Integrilin IV drip for dual anti-platelet therapy. ANESTHESIA/SEDATION: General endotracheal tube anesthesia with anesthesia team CONTRAST:  144 cc IV contrast FLUOROSCOPY TIME:  Fluoroscopy Time: 54 minutes 6 seconds (2,926 mGy). COMPLICATIONS: None TECHNIQUE: Informed written consent was obtained from the patient's family after a thorough discussion of the procedural risks, benefits and alternatives. Specific risks discussed include: Bleeding, infection, contrast reaction, kidney injury/failure, need for further procedure/surgery, arterial injury or dissection, embolization to new territory, intracranial hemorrhage (10-15% risk), neurologic deterioration, cardiopulmonary collapse, death. All questions were addressed. Maximal Sterile Barrier Technique was utilized including during the procedure including caps, mask, sterile gowns, sterile gloves, sterile drape, hand hygiene and skin antiseptic. A timeout was performed prior to the initiation of the procedure. FINDINGS: Initial angiogram: Left common carotid artery:  Normal course caliber and contour. Left external carotid artery: Patent with antegrade flow. On the initial injection before treatment, there is minimal opacification of the carotid siphon secondary to meningeal branches which appear to be region ating from ascending pharyngeal distribution and potentially deep meningeal  branches from the external carotid artery. No significant filling of the MCA on the initial injection The initial injection does confirmed perfusion of the lenticulostriate vasculature, as well as a patent anterior coronal artery, with minimal filling of temporal lobe. Anterior basal ganglia structures are perfused. Left internal carotid artery: Internal carotid artery is occluded at the origin with a string sign at the posterior aspect of the artery. Left MCA: Initial injection demonstrates no significant filling of the middle cerebral artery. 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 Left ACA: Anterior cerebral artery perfused by the right-sided flow at the initiation. Final angiogram: After treatment of the carotid occlusion and deployment of 8 mm-6 mm x 40 mm carotid stent, there is excellent flow through the cervical carotid to the intracranial carotid. After mechanical thrombectomy of M1 occlusion, there is modified treatment in cerebral ischemia of 3. Flat panel CT: No complications with small contrast stain of the left basal ganglia. PROCEDURE: Patient was brought to the neurointerventional suite, with the patient identity confirmed, and consent form confirmed. General anesthesia was present for initiation of general endotracheal tube anesthesia. Patient was prepped and draped in the usual sterile fashion. The only venous access was a port catheter with single needle access. Ultrasound survey of the right inguinal region was performed with images stored and sent to PACs. 11 blade scalpel was used to make a small incision. A micropuncture needle was used access the right common femoral artery under ultrasound. With excellent arterial blood flow returned, an .018 micro wire was passed through the needle, observed to enter the abdominal aorta under fluoroscopy. The needle was removed, and a micropuncture sheath was placed over the wire. The inner  dilator and wire were removed, and an 035 Bentson wire was advanced under fluoroscopy into the abdominal aorta. The sheath was removed and a standard 5 [place] vascular sheath was placed. The dilator was removed and the sheath was flushed. After the initial common femoral artery access, ultrasound survey of the left inguinal region was performed in order to place arterial monitoring line. Images stored and sent to PACs. Micropuncture needle was used access the left common femoral artery under ultrasound. With excellent arterial blood flow returned, an .018 micro wire was passed through the needle, observed to enter the abdominal aorta under fluoroscopy. The needle was  removed, and a micropuncture sheath was placed over the wire. The inner dilator and wire were removed, and an 035 Bentson wire was advanced under fluoroscopy into the abdominal aorta. The sheath was removed and a standard 4 [place] vascular sheath was placed. The dilator was removed and the sheath was flushed. A 26F JB-1 diagnostic catheter was advanced over the wire through the right common femoral artery access to the proximal descending thoracic aorta. Wire was then removed. Double flush of the catheter was performed. Catheter was then used to select the left common carotid artery. Angiogram was performed. Using roadmap technique, the catheter was advanced over a roadrunner wire into right common carotid artery. Formal angiogram was performed. Catheter was advanced over the roadrunner wire into the external carotid artery. Wire was removed. Exchange length Rosen wire was then passed through the diagnostic catheter to the distal common carotid artery and the diagnostic catheter was removed. The 5 French sheath was removed and exchanged for 8 French 55 centimeter BrightTip sheath. Sheath was flushed and attached to pressurized and heparinized saline bag for constant forward flow. Then an 8 [place], 95 cm Flowgate balloon tip catheter was prepared on the  back table with inflation of the balloon with 50/50 concentration of dilute contrast. The balloon catheter was then advanced over the wire, positioned into the distal common carotid artery. Copious back flush was performed and the balloon catheter was attached to heparinized and pressurized saline bag for forward flow. Roadmap was then performed at the carotid bifurcation. Combination of a rapid transit microwire and a synchro soft wire were used to navigate beyond the occlusion at the proximal internal carotid artery. The microcatheter was then navigated into the distal cervical segment, wire was removed, blood was aspirated, and a gentle injection confirmed a luminal position. Exchange length Transcend wire was then placed through the microcatheter into the cervical carotid. Microcatheter was removed. Balloon angioplasty was then performed with a rapid exchange system, using a Viatrac balloon 5 mm x 30 mm. The patient required treatment at this time with atropine dose, as we experienced bradycardia into the 30s. His heart rate recovered after treatment by the anesthesia team. Balloon was removed. Angiogram confirmed flow into the cervical carotid. A coaxial system of an Ace 64 penumbra aspiration catheter and the Trevo pro view 18 catheter was then advanced over the exchange length wire into the cervical carotid. Once the coaxial system was in the cervical carotid, the microwire was removed and exchanged for synchro soft. The synchro soft wire was navigated beyond the occlusion under roadmap guidance, and the aspiration catheter was advanced into the ICA terminus. The balloon guide catheter was then advanced into the mid segment of the cervical carotid artery. The microcatheter and microwire were then carefully advanced through the occluded segment. Microcatheter was then push through the occluded segment and the wire was removed. Blood was then aspirated through the hub of the microcatheter, and a gentle contrast  injection was performed confirming intraluminal position. A rotating hemostatic valve was then attached to the back end of the microcatheter, and a pressurized and heparinized saline bag was attached to the catheter. 4 x 40 solitaire device was then selected. Back flush was achieved at the rotating hemostatic valve, and then the device was gently advanced through the microcatheter to the distal end. The retriever was then unsheathed by withdrawing the microcatheter under fluoroscopy. A 5 minutes interval was observed. At this time, ultrasound-guided access of the right common femoral vein was performed for  placement of a additional venous access and placement of a 12 French triple-lumen IV catheter. We then proceeded with the mechanical thrombectomy. The balloon at the balloon tip catheter was then inflated under fluoroscopy for proximal flow arrest. Constant aspiration was then performed at the penumbra aspiration catheter at the carotid terminus as the retriever was gently and slowly withdrawn with fluoroscopic observation. Once the retriever was entirely removed from the system, free aspiration was confirmed at the hub of the aspiration catheter, with free blood return confirmed. The balloon was then deflated, rotating hemostatic valve was reattached, and a control angiogram was performed, confirming restoration of flow. Penumbra catheter was then withdrawn, and the angiogram was performed at the carotid bifurcation. Given the appearance, we elected to place acute carotid stent. The lesion was then again navigated with synchro soft wire and rapid transit. With the microwire microcatheter positioned in the distal cervical carotid, the microwire was removed. The exchange length Transcend wire was then replaced and the microcatheter was removed. Acute carotid stent was then deployed, 8 mm-6 mm x 40 mm length. Before deployment, we initiated Integrilin drip treatment for dual anti-platelet therapy. Angiogram was  performed. Coaxial system of the penumbra aspiration catheter and the pro view 18 catheter were then navigated to the carotid siphon for better angiogram of the cerebral vasculature. Angiogram was performed. Microcatheter, a number aspiration catheter were withdrawn. The balloon guide catheter was positioned in the common carotid artery for final angiogram. All catheter and wires were removed. The sheath at the right common femoral artery access was withdrawn for an angiogram. Eight French Angio-Seal was then deployed for hemostasis. A flat panel CT was then performed.  Patient was then extubated. Estimated blood loss was 50 cc No complications encountered. IMPRESSION: Status post left-sided cerebral angiogram with treatment of left ICA/MCA tandem occlusion, requiring acute left carotid stenting and mechanical thrombectomy of a left M1 occlusion with achievement of mTICI-3 flow, and restoration of cervical ICA flow. Flat panel CT in the room demonstrates small contrast stain without complicating features, and the patient was extubated. Status post deployment of 8 French Angio-Seal for right common femoral artery hemostasis. Status post ultrasound-guided left common femoral artery access with a 4 French sheath for arterial monitoring, which remained after the case. Status post ultrasound-guided right common femoral vein access for additional IV access, which remained after the case transmitting the Integrilin drip. Signed, Dulcy Fanny. Earleen Newport, DO [facility] Radiology PLAN: Patient will go to the PACU ICU admission Bilateral hips will be straight until the left 4 French arterial monitoring sheath is removed and the right common femoral vein venous access sheath is removed. Routine wound management for the right common femoral artery sheath access. Frequent neurovascular checks. IV saline hydration for 8 hours given the renal insufficiency Blood pressure control management  of 120-140 with nicardipine drip ordered The goal will be dual anti-platelet therapy, for now with dose of 650 mg aspirin and the Integrilin drip. Electronically Signed   By: Corrie Mckusick D.O.   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 The antibiotic was administered within 1 hour of the procedure 75 MCG NITROGLYCERIN INTRA ARTERIAL 650 mg aspirin via orogastric tube. Intra procedural initiation of Integrilin IV drip for dual anti-platelet therapy. ANESTHESIA/SEDATION: General endotracheal tube anesthesia with anesthesia team CONTRAST:  144 cc IV contrast FLUOROSCOPY TIME:  Fluoroscopy Time: 54 minutes 6 seconds (2,926 mGy). COMPLICATIONS: None TECHNIQUE: Informed written consent was obtained from the patient's family after a thorough discussion of the procedural risks, benefits and alternatives. Specific risks discussed include: Bleeding, infection, contrast reaction, kidney injury/failure, need for further procedure/surgery, arterial injury or dissection, embolization to new territory, intracranial hemorrhage (10-15% risk), neurologic deterioration,  cardiopulmonary collapse, death. All questions were addressed. Maximal Sterile Barrier Technique was utilized including during the procedure including caps, mask, sterile gowns, sterile gloves, sterile drape, hand hygiene and skin antiseptic. A timeout was performed prior to the initiation of the procedure. FINDINGS: Initial angiogram: Left common carotid artery:  Normal course caliber and contour. Left external carotid artery: Patent with antegrade flow. On the initial injection before treatment, there is minimal opacification of the carotid siphon secondary to meningeal branches which appear to be region ating from ascending pharyngeal distribution and potentially deep meningeal branches from the external carotid artery. No significant filling of the MCA on the initial injection The initial injection does confirmed perfusion of the lenticulostriate vasculature, as well as a patent anterior coronal artery, with minimal filling of temporal lobe. Anterior basal ganglia structures are perfused. Left internal carotid artery: Internal carotid artery is occluded at the origin with a string sign at the posterior aspect of the artery. Left MCA: Initial injection demonstrates no significant filling of the middle cerebral artery. 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 Left ACA: Anterior cerebral artery perfused by the right-sided flow at the initiation. Final angiogram: After treatment of the carotid occlusion and deployment of 8 mm-6 mm x 40 mm carotid stent, there is excellent flow through the cervical carotid to the intracranial carotid. After mechanical thrombectomy of M1 occlusion, there is modified treatment in cerebral ischemia of 3. Flat panel CT: No complications with small contrast stain of the left basal ganglia. PROCEDURE: Patient was brought to the neurointerventional suite, with the patient identity confirmed, and consent form confirmed. General  anesthesia was present for initiation of general endotracheal tube anesthesia. Patient was prepped and draped in the usual sterile fashion. The only venous access was a port catheter with single needle access. Ultrasound survey of the right inguinal region was performed with images stored and sent to PACs. 11 blade scalpel was used to make a small incision. A micropuncture needle was used access the right common femoral artery under ultrasound. With excellent arterial blood flow returned, an .018 micro wire was passed through the needle, observed to enter the abdominal aorta under fluoroscopy. The needle was removed, and a micropuncture sheath was placed over the wire. The inner dilator and wire were removed, and an 035 Bentson wire was advanced under fluoroscopy into the abdominal aorta. The sheath was removed and a standard 5 [place] vascular sheath was placed. The dilator was removed and the sheath was flushed. After the  initial common femoral artery access, ultrasound survey of the left inguinal region was performed in order to place arterial monitoring line. Images stored and sent to PACs. Micropuncture needle was used access the left common femoral artery under ultrasound. With excellent arterial blood flow returned, an .018 micro wire was passed through the needle, observed to enter the abdominal aorta under fluoroscopy. The needle was removed, and a micropuncture sheath was placed over the wire. The inner dilator and wire were removed, and an 035 Bentson wire was advanced under fluoroscopy into the abdominal aorta. The sheath was removed and a standard 4 [place] vascular sheath was placed. The dilator was removed and the sheath was flushed. A 40F JB-1 diagnostic catheter was advanced over the wire through the right common femoral artery access to the proximal descending thoracic aorta. Wire was then removed. Double flush of the catheter was performed. Catheter was then used to select the left common carotid  artery. Angiogram was performed. Using roadmap technique, the catheter was advanced over a roadrunner wire into right common carotid artery. Formal angiogram was performed. Catheter was advanced over the roadrunner wire into the external carotid artery. Wire was removed. Exchange length Rosen wire was then passed through the diagnostic catheter to the distal common carotid artery and the diagnostic catheter was removed. The 5 French sheath was removed and exchanged for 8 French 55 centimeter BrightTip sheath. Sheath was flushed and attached to pressurized and heparinized saline bag for constant forward flow. Then an 8 [place], 95 cm Flowgate balloon tip catheter was prepared on the back table with inflation of the balloon with 50/50 concentration of dilute contrast. The balloon catheter was then advanced over the wire, positioned into the distal common carotid artery. Copious back flush was performed and the balloon catheter was attached to heparinized and pressurized saline bag for forward flow. Roadmap was then performed at the carotid bifurcation. Combination of a rapid transit microwire and a synchro soft wire were used to navigate beyond the occlusion at the proximal internal carotid artery. The microcatheter was then navigated into the distal cervical segment, wire was removed, blood was aspirated, and a gentle injection confirmed a luminal position. Exchange length Transcend wire was then placed through the microcatheter into the cervical carotid. Microcatheter was removed. Balloon angioplasty was then performed with a rapid exchange system, using a Viatrac balloon 5 mm x 30 mm. The patient required treatment at this time with atropine dose, as we experienced bradycardia into the 30s. His heart rate recovered after treatment by the anesthesia team. Balloon was removed. Angiogram confirmed flow into the cervical carotid. A coaxial system of an Ace 64 penumbra aspiration catheter and the Trevo pro view 18  catheter was then advanced over the exchange length wire into the cervical carotid. Once the coaxial system was in the cervical carotid, the microwire was removed and exchanged for synchro soft. The synchro soft wire was navigated beyond the occlusion under roadmap guidance, and the aspiration catheter was advanced into the ICA terminus. The balloon guide catheter was then advanced into the mid segment of the cervical carotid artery. The microcatheter and microwire were then carefully advanced through the occluded segment. Microcatheter was then push through the occluded segment and the wire was removed. Blood was then aspirated through the hub of the microcatheter, and a gentle contrast injection was performed confirming intraluminal position. A rotating hemostatic valve was then attached to the back end of the microcatheter, and a pressurized and heparinized saline bag was attached  to the catheter. 4 x 40 solitaire device was then selected. Back flush was achieved at the rotating hemostatic valve, and then the device was gently advanced through the microcatheter to the distal end. The retriever was then unsheathed by withdrawing the microcatheter under fluoroscopy. A 5 minutes interval was observed. At this time, ultrasound-guided access of the right common femoral vein was performed for placement of a additional venous access and placement of a 12 French triple-lumen IV catheter. We then proceeded with the mechanical thrombectomy. The balloon at the balloon tip catheter was then inflated under fluoroscopy for proximal flow arrest. Constant aspiration was then performed at the penumbra aspiration catheter at the carotid terminus as the retriever was gently and slowly withdrawn with fluoroscopic observation. Once the retriever was entirely removed from the system, free aspiration was confirmed at the hub of the aspiration catheter, with free blood return confirmed. The balloon was then deflated, rotating hemostatic  valve was reattached, and a control angiogram was performed, confirming restoration of flow. Penumbra catheter was then withdrawn, and the angiogram was performed at the carotid bifurcation. Given the appearance, we elected to place acute carotid stent. The lesion was then again navigated with synchro soft wire and rapid transit. With the microwire microcatheter positioned in the distal cervical carotid, the microwire was removed. The exchange length Transcend wire was then replaced and the microcatheter was removed. Acute carotid stent was then deployed, 8 mm-6 mm x 40 mm length. Before deployment, we initiated Integrilin drip treatment for dual anti-platelet therapy. Angiogram was performed. Coaxial system of the penumbra aspiration catheter and the pro view 18 catheter were then navigated to the carotid siphon for better angiogram of the cerebral vasculature. Angiogram was performed. Microcatheter, a number aspiration catheter were withdrawn. The balloon guide catheter was positioned in the common carotid artery for final angiogram. All catheter and wires were removed. The sheath at the right common femoral artery access was withdrawn for an angiogram. Eight French Angio-Seal was then deployed for hemostasis. A flat panel CT was then performed.  Patient was then extubated. Estimated blood loss was 50 cc No complications encountered. IMPRESSION: Status post left-sided cerebral angiogram with treatment of left ICA/MCA tandem occlusion, requiring acute left carotid stenting and mechanical thrombectomy of a left M1 occlusion with achievement of mTICI-3 flow, and restoration of cervical ICA flow. Flat panel CT in the room demonstrates small contrast stain without complicating features, and the patient was extubated. Status post deployment of 8 French Angio-Seal for right common femoral artery hemostasis. Status post ultrasound-guided left common femoral artery access with a 4 French sheath for arterial monitoring,  which remained after the case. Status post ultrasound-guided right common femoral vein access for additional IV access, which remained after the case transmitting the Integrilin drip. Signed, Dulcy Fanny. Earleen Newport, DO [facility] Radiology PLAN: Patient will go to the PACU ICU admission Bilateral hips will be straight until the left 4 French arterial monitoring sheath is removed and the right common femoral vein venous access sheath is removed. Routine wound management for the right common femoral artery sheath access. Frequent neurovascular checks. IV saline hydration for 8 hours given the renal insufficiency Blood pressure control management of 120-140 with nicardipine drip ordered The goal will be dual anti-platelet therapy, for now with dose of 650 mg aspirin and the Integrilin drip. Electronically Signed   By: Corrie Mckusick D.O.   On: 05/06/2017 09:24   Dg Chest Port 1 View  Result Date:  05/08/2017 CLINICAL DATA:  Abnormal chest x-ray. EXAM: PORTABLE CHEST 1 VIEW COMPARISON:  Radiograph of May 06, 2017. FINDINGS: Stable cardiomegaly. Right internal jugular Port-A-Cath is unchanged in position. No pneumothorax is noted. Mild right midlung subsegmental atelectasis is noted. 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 Bony thorax is unremarkable. IMPRESSION: Stable left perihilar and basilar opacities are noted as described above. Mild right midlung subsegmental atelectasis. Electronically Signed   By: Marijo Conception, M.D.   On: 05/08/2017 15:38   Dg Chest Port 1 View  Result Date: 05/06/2017 CLINICAL DATA:  Shortness of breath. Patient status post left MCA thrombectomy and left ICA stent placement today. EXAM: PORTABLE CHEST 1 VIEW COMPARISON:  PA and lateral chest 03/18/2017.  CT chest 03/24/2017. FINDINGS: Interstitial prominence about the patient's left  upper lobe pulmonary mass is worse than on the prior plain films of the chest. Mild basilar atelectasis is noted. Heart size is normal. Port-A-Cath is in place. Aortic atherosclerosis is seen. IMPRESSION: Increased interstitial opacities about the patient's left upper lobe pulmonary mass are nonspecific and could be due to tumor spread, postobstructive pneumonitis or less likely hemorrhage. Atherosclerosis. Electronically Signed   By: Inge Rise M.D.   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 The antibiotic was administered within 1 hour of the procedure 75 MCG NITROGLYCERIN INTRA ARTERIAL 650 mg aspirin via orogastric tube. Intra procedural initiation of Integrilin IV drip for dual anti-platelet therapy. ANESTHESIA/SEDATION: General endotracheal tube anesthesia with anesthesia team CONTRAST:  144 cc IV contrast FLUOROSCOPY TIME:  Fluoroscopy Time: 54 minutes 6 seconds (2,926 mGy). COMPLICATIONS: None TECHNIQUE: Informed written consent was obtained from the patient's family after a thorough discussion of the procedural  risks, benefits and alternatives. Specific risks discussed include: Bleeding, infection, contrast reaction, kidney injury/failure, need for further procedure/surgery, arterial injury or dissection, embolization to new territory, intracranial hemorrhage (10-15% risk), neurologic deterioration, cardiopulmonary collapse, death. All questions were addressed. Maximal Sterile Barrier Technique was utilized including during the procedure including caps, mask, sterile gowns, sterile gloves, sterile drape, hand hygiene and skin antiseptic. A timeout was performed prior to the initiation of the procedure. FINDINGS: Initial angiogram: Left common carotid artery:  Normal course caliber and contour. Left external carotid artery: Patent with antegrade flow. On the initial injection before treatment, there is minimal opacification of the carotid siphon secondary to meningeal branches which appear to be region ating from ascending pharyngeal distribution and potentially deep meningeal branches from the external carotid artery. No significant filling of the MCA on the initial injection The initial injection does confirmed perfusion of the lenticulostriate vasculature, as well as a patent anterior coronal artery, with minimal filling of temporal lobe.  Anterior basal ganglia structures are perfused. Left internal carotid artery: Internal carotid artery is occluded at the origin with a string sign at the posterior aspect of the artery. Left MCA: Initial injection demonstrates no significant filling of the middle cerebral artery. 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 Left ACA: Anterior cerebral artery perfused by the right-sided flow at the initiation. Final angiogram: After treatment of the carotid occlusion and deployment of 8 mm-6 mm x 40 mm carotid stent, there is excellent flow through the cervical carotid to the intracranial carotid. After mechanical thrombectomy of  M1 occlusion, there is modified treatment in cerebral ischemia of 3. Flat panel CT: No complications with small contrast stain of the left basal ganglia. PROCEDURE: Patient was brought to the neurointerventional suite, with the patient identity confirmed, and consent form confirmed. General anesthesia was present for initiation of general endotracheal tube anesthesia. Patient was prepped and draped in the usual sterile fashion. The only venous access was a port catheter with single needle access. Ultrasound survey of the right inguinal region was performed with images stored and sent to PACs. 11 blade scalpel was used to make a small incision. A micropuncture needle was used access the right common femoral artery under ultrasound. With excellent arterial blood flow returned, an .018 micro wire was passed through the needle, observed to enter the abdominal aorta under fluoroscopy. The needle was removed, and a micropuncture sheath was placed over the wire. The inner dilator and wire were removed, and an 035 Bentson wire was advanced under fluoroscopy into the abdominal aorta. The sheath was removed and a standard 5 [place] vascular sheath was placed. The dilator was removed and the sheath was flushed. After the initial common femoral artery access, ultrasound survey of the left inguinal region was performed in order to place arterial monitoring line. Images stored and sent to PACs. Micropuncture needle was used access the left common femoral artery under ultrasound. With excellent arterial blood flow returned, an .018 micro wire was passed through the needle, observed to enter the abdominal aorta under fluoroscopy. The needle was removed, and a micropuncture sheath was placed over the wire. The inner dilator and wire were removed, and an 035 Bentson wire was advanced under fluoroscopy into the abdominal aorta. The sheath was removed and a standard 4 [place] vascular sheath was placed. The dilator was removed and the  sheath was flushed. A 79F JB-1 diagnostic catheter was advanced over the wire through the right common femoral artery access to the proximal descending thoracic aorta. Wire was then removed. Double flush of the catheter was performed. Catheter was then used to select the left common carotid artery. Angiogram was performed. Using roadmap technique, the catheter was advanced over a roadrunner wire into right common carotid artery. Formal angiogram was performed. Catheter was advanced over the roadrunner wire into the external carotid artery. Wire was removed. Exchange length Rosen wire was then passed through the diagnostic catheter to the distal common carotid artery and the diagnostic catheter was removed. The 5 French sheath was removed and exchanged for 8 French 55 centimeter BrightTip sheath. Sheath was flushed and attached to pressurized and heparinized saline bag for constant forward flow. Then an 8 [place], 95 cm Flowgate balloon tip catheter was prepared on the back table with inflation of the balloon with 50/50 concentration of dilute contrast. The balloon catheter was then advanced over the wire, positioned into the distal common carotid artery. Copious back  flush was performed and the balloon catheter was attached to heparinized and pressurized saline bag for forward flow. Roadmap was then performed at the carotid bifurcation. Combination of a rapid transit microwire and a synchro soft wire were used to navigate beyond the occlusion at the proximal internal carotid artery. The microcatheter was then navigated into the distal cervical segment, wire was removed, blood was aspirated, and a gentle injection confirmed a luminal position. Exchange length Transcend wire was then placed through the microcatheter into the cervical carotid. Microcatheter was removed. Balloon angioplasty was then performed with a rapid exchange system, using a Viatrac balloon 5 mm x 30 mm. The patient required treatment at this time  with atropine dose, as we experienced bradycardia into the 30s. His heart rate recovered after treatment by the anesthesia team. Balloon was removed. Angiogram confirmed flow into the cervical carotid. A coaxial system of an Ace 64 penumbra aspiration catheter and the Trevo pro view 18 catheter was then advanced over the exchange length wire into the cervical carotid. Once the coaxial system was in the cervical carotid, the microwire was removed and exchanged for synchro soft. The synchro soft wire was navigated beyond the occlusion under roadmap guidance, and the aspiration catheter was advanced into the ICA terminus. The balloon guide catheter was then advanced into the mid segment of the cervical carotid artery. The microcatheter and microwire were then carefully advanced through the occluded segment. Microcatheter was then push through the occluded segment and the wire was removed. Blood was then aspirated through the hub of the microcatheter, and a gentle contrast injection was performed confirming intraluminal position. A rotating hemostatic valve was then attached to the back end of the microcatheter, and a pressurized and heparinized saline bag was attached to the catheter. 4 x 40 solitaire device was then selected. Back flush was achieved at the rotating hemostatic valve, and then the device was gently advanced through the microcatheter to the distal end. The retriever was then unsheathed by withdrawing the microcatheter under fluoroscopy. A 5 minutes interval was observed. At this time, ultrasound-guided access of the right common femoral vein was performed for placement of a additional venous access and placement of a 12 French triple-lumen IV catheter. We then proceeded with the mechanical thrombectomy. The balloon at the balloon tip catheter was then inflated under fluoroscopy for proximal flow arrest. Constant aspiration was then performed at the penumbra aspiration catheter at the carotid terminus as  the retriever was gently and slowly withdrawn with fluoroscopic observation. Once the retriever was entirely removed from the system, free aspiration was confirmed at the hub of the aspiration catheter, with free blood return confirmed. The balloon was then deflated, rotating hemostatic valve was reattached, and a control angiogram was performed, confirming restoration of flow. Penumbra catheter was then withdrawn, and the angiogram was performed at the carotid bifurcation. Given the appearance, we elected to place acute carotid stent. The lesion was then again navigated with synchro soft wire and rapid transit. With the microwire microcatheter positioned in the distal cervical carotid, the microwire was removed. The exchange length Transcend wire was then replaced and the microcatheter was removed. Acute carotid stent was then deployed, 8 mm-6 mm x 40 mm length. Before deployment, we initiated Integrilin drip treatment for dual anti-platelet therapy. Angiogram was performed. Coaxial system of the penumbra aspiration catheter and the pro view 18 catheter were then navigated to the carotid siphon for better angiogram of the cerebral vasculature. Angiogram was performed. Microcatheter, a number aspiration  catheter were withdrawn. The balloon guide catheter was positioned in the common carotid artery for final angiogram. All catheter and wires were removed. The sheath at the right common femoral artery access was withdrawn for an angiogram. Eight French Angio-Seal was then deployed for hemostasis. A flat panel CT was then performed.  Patient was then extubated. Estimated blood loss was 50 cc No complications encountered. IMPRESSION: Status post left-sided cerebral angiogram with treatment of left ICA/MCA tandem occlusion, requiring acute left carotid stenting and mechanical thrombectomy of a left M1 occlusion with achievement of mTICI-3 flow, and restoration of cervical ICA flow. Flat panel CT in the room demonstrates  small contrast stain without complicating features, and the patient was extubated. Status post deployment of 8 French Angio-Seal for right common femoral artery hemostasis. Status post ultrasound-guided left common femoral artery access with a 4 French sheath for arterial monitoring, which remained after the case. Status post ultrasound-guided right common femoral vein access for additional IV access, which remained after the case transmitting the Integrilin drip. Signed, Dulcy Fanny. Earleen Newport, DO [facility] Radiology PLAN: Patient will go to the PACU ICU admission Bilateral hips will be straight until the left 4 French arterial monitoring sheath is removed and the right common femoral vein venous access sheath is removed. Routine wound management for the right common femoral artery sheath access. Frequent neurovascular checks. IV saline hydration for 8 hours given the renal insufficiency Blood pressure control management of 120-140 with nicardipine drip ordered The goal will be dual anti-platelet therapy, for now with dose of 650 mg aspirin and the Integrilin drip. Electronically Signed   By: Corrie Mckusick D.O.   On: 05/06/2017 09:24   Ct Head Code Stroke Wo Contrast  Result Date: 05/06/2017 CLINICAL DATA:  Code stroke. 77 y/o M; right-sided weakness and numbness with slurred speech. EXAM: CT HEAD WITHOUT CONTRAST TECHNIQUE: Contiguous axial images were obtained from the base of the skull through the vertex without intravenous contrast. COMPARISON:  11/19/2016 MRI of the head. FINDINGS: Brain: No evidence of acute infarction, hemorrhage, hydrocephalus, extra-axial collection or mass lesion/mass effect. Stable moderate chronic microvascular ischemic changes and parenchymal volume loss of the brain. Vascular: Calcific atherosclerosis of carotid siphons. Dense left M1 (series 5, image 30). Skull: Normal. Negative for fracture or focal lesion. Sinuses/Orbits: No acute  finding. Other: None. ASPECTS [facility] Stroke Program Early CT Score) - Ganglionic level infarction (caudate, lentiform nuclei, internal capsule, insula, M1-M3 cortex): 7 - Supraganglionic infarction (M4-M6 cortex): 3 Total score (0-10 with 10 being normal): 10 IMPRESSION: 1. No acute stroke, hemorrhage, or mass effect identified. 2. Dense left M1, possible thrombus. 3. ASPECTS is 10 4. Stable chronic microvascular ischemic changes and parenchymal volume loss of the brain given differences in technique. These results were communicated to Dr. Cheral Marker at 3:41 amon 2/28/2019by text page via the [facility] messaging system. Electronically Signed   By: Kristine Garbe M.D.   On: 05/06/2017 03:43   TTE : Left ventricle: The cavity size was normal. There was mild concentric hypertrophy. Systolic function was normal. The estimated ejection fraction was in the range of 60% to 65%. Wall motion was normal; there were no regional wall motion abnormalities   PHYSICAL EXAM  Temp:  [98.3 F (36.8 C)-98.7 F (37.1 C)] 98.4 F (36.9 C) (03/03 0416) Pulse Rate:  [52-76] 57 (03/03 1200) Resp:  [12-22] 14 (03/03 1200) BP: (95-144)/(56-74) 116/60 (03/03 1200) SpO2:  [92 %-100 %] 100 % (03/03 1200)  General -  Well nourished, well developed pleasant elderly African-American male, in no acute distress.  Ophthalmologic - fundi not visualized due to noncooperation.  Cardiovascular - Regular rate and rhythm.  Neuro - awake alert, eyes open, follows simple commands, paucity of speech, moderate dysarthria, able to repeat sentences and name. Eyes no gaze preference, no neglect, blinking to visual threat bilaterally. PERRL, right facial droop, tongue midline. RUE 4/5 with drift, BLE 4/5, LUE 5/5. DTR 1+ and no babinski. Sensation symmetrical, coordination intact bilaterally, gait deferred.    ASSESSMENT/PLAN [name] is a 77 y.o. 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 No tPA given due to concern of brain metastasis.    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 cardioembolic source  Resultant left gaze preference, right facial droop and right UE mild weakness  MRI  Left BG, mesial temporal lobe small infarcts and left MCA punctate infarcts  MRA  Patent left ICA and MCA  CTA head and neck - left ICA and left M1 occlusion  DSA - TICI3 reperfusion, left ICA stenting  2D Echo  Pending  Will do TEE and loop Monday to evaluate cardiac source  LDL 69  HgbA1c 9.1  subq heparin for VTE prophylaxis Fall precautions Diet heart healthy/carb modified Room service appropriate? Yes; Fluid consistency: Thin  Diet NPO time specified Except for: Sips with Meds   aspirin 81 mg daily prior to admission, now on aspirin 325 mg daily and Plavix  n. Ongoing aggressive stroke risk factor management  Therapy recommendations:  CLR Disposition:  CLR Small cell lung cancer   11/2016 brain MRI no brain metastasis  On chemo and radiation  Follows with [facility]  Discussed with Dr. Rogue Bussing, pt primary oncologist, no concern of hypercoagulable state at this time, he needs further cardiac work up  Diabetes  HgbA1c 9.1 goal < 7.0  Hyperglycemia, but overnight hypoglycemia - likely due to not eating well - now eating good  On pre-meal insulin now  Resume home po meds  CBG monitoring  SSI  DM coordinator consult   Hypertension but now hypotension BP at low end Likely due to ICA stenting  Long term BP goal normotensive  completed albumin  On IVF  On neo  Put on midodrine, taper off neo as able  Hyperlipidemia  Home meds:  none   LDL 69, goal < 70  Statin allergy listed in  file  CKD  Cre 1.98->1.80->1.88  At baseline   Continue IVF  Other Stroke Risk Factors  Advanced age  Former smoker - quit 13 years ago  Other Active Problems Hypotension  Hospital day # 3   Appreciate help from  medical hospitalist team to help manage his hypotension. Will not transfer out of the ICU and mobilize out of bed. TEE and loop tomorrow. Transfer to rehab in next few days. Greater than 50% of time during this 35 minute visit was spent on counseling and coordination of care about his stroke and answering questions     Antony Contras,, MD   Stroke Neurology 05/09/2017 12:41 PM    To contact Stroke Continuity provider, please refer to [email or web address] After hours, contact General Neurology

## 2017-05-09 NOTE — Progress Notes (Signed)
Consult progress note                                                                                Patient Demographics  Edwin Jones, is a 77 y.o. male, DOB - 10/17/1940, BLT:[id number]  Admit date - 05/06/2017   Admitting Physician Edwin Elbe, MD  Outpatient Primary MD for the patient is Edwin Merles, MD  Outpatient specialists:   LOS - 3  days   Medical records reviewed and are as summarized below:    Chief Complaint  Patient presents with  . Code Stroke       Brief summary   Reason for consult: Hypotension  Edwin Jones is a 77 y.o. male with pmhx significant for COPD, CA, DM, GERD, Hep C, HLD, HTN and seasonal allergies. Pt had low BP after neo drip stopped. Pt had received midodrine, NS boluses and albumin. With no effect. Hospitalists consulted. Pt denies any bleeding and this was confirmed by nursing. PO intake has been >75% of meals. No N/V/D. Pt denies any dizziness, sob, cp.   Assessment & Plan    Principal Problem:   Acute ischemic left MCA stroke ([place]) - Per stroke service, primary  Active Problems:     Hypotension - Currently stable and improved, not on any vasopressors, overnight no hypotensive episodes - Lactic acid was elevated, 2.5 patient received IV fluid hydration, improved to 1.9 - Cortisol level 15.6, rules out adrenal insufficiency - Hemoglobin stable - Hold off on outpatient antihypertensives: lisinopril, HCTZ - No infectious etiology - Currently on midodrine, Florinef.     GERD without esophagitis - Continue famotidine    Diabetes mellitus without complication (HCC) - Hemoglobin A1c 9.1, needs better control - Currently on sliding scale insulin, glipizide, tradjents    Code Status: full  DVT Prophylaxis:  heparin  Family Communication: Discussed in detail with the patient, all imaging results, Jones results explained to the patient    Disposition Plan: per primary service (neurology)  Time Spent in minutes   25  minutes    Medications  Scheduled Meds: .  stroke: mapping our early stages of recovery book   Does not apply Once  . acyclovir ointment   Topical Q3H  . aspirin  325 mg Oral Daily  . Chlorhexidine Gluconate Cloth  6 each Topical Daily  . clopidogrel  75 mg Oral Daily  . fludrocortisone  0.1 mg Oral Daily  . fluticasone furoate-vilanterol  1 puff Inhalation Daily  . glipiZIDE  10 mg Oral Daily  . heparin injection (subcutaneous)  5,000 Units Subcutaneous Q8H  . insulin aspart  0-15 Units Subcutaneous TID WC  . insulin aspart  0-5 Units Subcutaneous QHS  . insulin aspart  4 Units Subcutaneous TID WC  . linagliptin  5 mg Oral Daily  . methylPREDNISolone (SOLU-MEDROL) injection  125 mg Intravenous Once  . midodrine  5 mg Oral TID WC   Continuous Infusions: . sodium chloride 75 mL/hr at 05/08/17 2300  . famotidine (PEPCID) IV    . niCARDipine    . sodium chloride    . sodium chloride     PRN Meds:.acetaminophen **OR**  acetaminophen (TYLENOL) oral liquid 160 mg/5 mL **OR** acetaminophen, albuterol, ondansetron (ZOFRAN) IV, senna-docusate, sodium chloride flush   Antibiotics   Anti-infectives (From admission, onward)   Start     Dose/Rate Route Frequency Ordered Stop   05/06/17 0444  vancomycin (VANCOCIN) 1-5 GM/200ML-% IVPB    Comments:  Edwin Jones   : cabinet override      05/06/17 0444 05/06/17 1659        Subjective:   Edwin Jones was seen and examined today.  Patient denies dizziness, chest pain, shortness of breath, abdominal pain, N/V/D/C, new weakness, numbess, tingling. No acute events overnight.    Objective:   Vitals:   05/09/17 0600 05/09/17 0700 05/09/17 0800 05/09/17 0900  BP: 137/68  139/69 140/74  Pulse: 65 60 (!) 52 69  Resp: 18 14 12 17   Temp:      TempSrc:      SpO2: 96% 99% 100% 99%  Weight:      Height:        Intake/Output Summary (Last 24 hours) at 05/09/2017 0956 Last data filed at 05/09/2017 0700 Gross per 24 hour  Intake 3327.5 ml   Output 1300 ml  Net 2027.5 ml     Wt Readings from Last 3 Encounters:  05/06/17 96.2 kg (212 lb 1.3 oz)  03/25/17 97.2 kg (214 lb 3.2 oz)  02/17/17 95.2 kg (209 lb 14.1 oz)     Exam  General: Alert and oriented, NAD  Eyes:   HEENT:    Cardiovascular: S1 S2 auscultated, Regular rate and rhythm.  Respiratory: Clear to auscultation bilaterally, no wheezing, rales or rhonchi  Gastrointestinal: Soft, nontender, nondistended, + bowel sounds  Ext: no pedal edema bilaterally  Neuro: RUE, RLE 4/5. Left side 5/5   Musculoskeletal: No digital cyanosis, clubbing  Skin: No rashes  Psych: Normal affect and demeanor, alert and oriented   Data Reviewed:  I have personally reviewed following labs and imaging studies  Micro Results Recent Results (from the past 240 hour(s))  MRSA PCR Screening     Status: None   Collection Time: 05/06/17  8:44 AM  Result Value Ref Range Status   MRSA by PCR NEGATIVE NEGATIVE Final    Comment:        The GeneXpert MRSA Assay (FDA approved for NASAL specimens only), is one component of a comprehensive MRSA colonization surveillance program. It is not intended to diagnose MRSA infection nor to guide or monitor treatment for MRSA infections. Performed at [facility], Edwin [facility] [address] [place], [place] [address]     Radiology Reports Ct Angio Head W Or Wo Contrast  Result Date: 05/06/2017 CLINICAL DATA:  77 y/o M; right-sided weakness, numbness, slurred speech. EXAM: CT ANGIOGRAPHY HEAD AND NECK TECHNIQUE: Multidetector CT imaging of the head and neck was performed using the standard protocol during bolus administration of intravenous contrast. Multiplanar CT image reconstructions and MIPs were obtained to evaluate the vascular anatomy. Carotid stenosis measurements (when applicable) are obtained utilizing NASCET criteria, using the distal internal carotid diameter as the denominator. CONTRAST:  10mL ISOVUE-370 IOPAMIDOL (ISOVUE-370)  INJECTION 76% COMPARISON:  None. FINDINGS: CTA NECK FINDINGS Aortic arch: Standard branching. Imaged portion shows no evidence of aneurysm or dissection. No significant stenosis of the major arch vessel origins. Right carotid system: No evidence of dissection, stenosis (50% or greater) or occlusion. Left carotid system: Patent left common carotid artery. Left ICA occlusion to the terminus where there is a short segment of reconstitution of the terminal  ICA and proximal left M1 via the anterior communicating artery. Vertebral arteries: Codominant. No evidence of dissection, stenosis (50% or greater) or occlusion. Skeleton: Moderate cervical spine spondylosis with multilevel discogenic degenerative changes greatest at the C4 through C7 levels or uncovertebral and facet hypertrophy encroach on neural foramen and there is mild canal stenosis. Other neck: Negative. Upper chest: Right port catheter in the SVC extending below field of view. Review of the MIP images confirms the above findings CTA HEAD FINDINGS Anterior circulation: Left mid M1 occlusion with poor left MCA distribution collateral circulation. Right distal M1 mild stenosis. No additional large vessel occlusion, aneurysm, or vascular malformation in the anterior circulation. Posterior circulation: No significant stenosis, proximal occlusion, aneurysm, or vascular malformation. Right P2 moderate stenosis and left P2 mild stenosis. Venous sinuses: As permitted by contrast timing, patent. Anatomic variants: Patent anterior and bilateral posterior communicating arteries. Review of the MIP images confirms the above findings IMPRESSION: 1. 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 Short segment of patency of left ICA terminus and proximal left M1 via anterior communicating artery. 2. Left mid M1 occlusion with poor left MCA collateralization. 3. Patent right carotid and bilateral vertebral systems. 4. Patent bilateral ACA, right MCA, and bilateral PCA  distributions. Intracranial atherosclerosis with multiple segments of mild-to-moderate stenosis. These results were called by telephone at the time of interpretation on 05/06/2017 at 4:10 am to Dr. Cheral Marker, who verbally acknowledged these results. Electronically Signed   By: Kristine Garbe M.D.   On: 05/06/2017 04:17   Ct Angio Neck W And/or Wo Contrast  Result Date: 05/06/2017 CLINICAL DATA:  77 y/o M; right-sided weakness, numbness, slurred speech. EXAM: CT ANGIOGRAPHY HEAD AND NECK TECHNIQUE: Multidetector CT imaging of the head and neck was performed using the standard protocol during bolus administration of intravenous contrast. Multiplanar CT image reconstructions and MIPs were obtained to evaluate the vascular anatomy. Carotid stenosis measurements (when applicable) are obtained utilizing NASCET criteria, using the distal internal carotid diameter as the denominator. CONTRAST:  72mL ISOVUE-370 IOPAMIDOL (ISOVUE-370) INJECTION 76% COMPARISON:  None. FINDINGS: CTA NECK FINDINGS Aortic arch: Standard branching. Imaged portion shows no evidence of aneurysm or dissection. No significant stenosis of the major arch vessel origins. Right carotid system: No evidence of dissection, stenosis (50% or greater) or occlusion. Left carotid system: Patent left common carotid artery. Left ICA occlusion to the terminus where there is a short segment of reconstitution of the terminal ICA and proximal left M1 via the anterior communicating artery. Vertebral arteries: Codominant. No evidence of dissection, stenosis (50% or greater) or occlusion. Skeleton: Moderate cervical spine spondylosis with multilevel discogenic degenerative changes greatest at the C4 through C7 levels or uncovertebral and facet hypertrophy encroach on neural foramen and there is mild canal stenosis. Other neck: Negative. Upper chest: Right port catheter in the SVC extending below field of view. Review of the MIP images confirms the above  findings CTA HEAD FINDINGS Anterior circulation: Left mid M1 occlusion with poor left MCA distribution collateral circulation. Right distal M1 mild stenosis. No additional large vessel occlusion, aneurysm, or vascular malformation in the anterior circulation. Posterior circulation: No significant stenosis, proximal occlusion, aneurysm, or vascular malformation. Right P2 moderate stenosis and left P2 mild stenosis. Venous sinuses: As permitted by contrast timing, patent. Anatomic variants: Patent anterior and bilateral posterior communicating arteries. Review of the MIP images confirms the above findings IMPRESSION: 1. 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 Short segment of patency of left ICA terminus and proximal left M1  via anterior communicating artery. 2. Left mid M1 occlusion with poor left MCA collateralization. 3. Patent right carotid and bilateral vertebral systems. 4. Patent bilateral ACA, right MCA, and bilateral PCA distributions. Intracranial atherosclerosis with multiple segments of mild-to-moderate stenosis. These results were called by telephone at the time of interpretation on 05/06/2017 at 4:10 am to Dr. Cheral Marker, who verbally acknowledged these results. Electronically Signed   By: Kristine Garbe M.D.   On: 05/06/2017 04:17   Mr Virgel Paling KW Contrast  Result Date: 05/06/2017 CLINICAL DATA:  77 year old male who presented with emergent large vessel occlusion of the left ICA status post endovascular reperfusion earlier today. Personal history of prior whole brain radiation for cancer, but no known brain metastases. EXAM: MRI HEAD WITHOUT CONTRAST MRA HEAD WITHOUT CONTRAST TECHNIQUE: Multiplanar, multiecho pulse sequences of the brain and surrounding structures were obtained without intravenous contrast. Angiographic images of the head were obtained using MRA technique without contrast. COMPARISON:  Cerebral angiogram, CTA head and neck, and noncontrast head CT earlier today. Prior  brain MRI 11/19/2016. FINDINGS: MRI HEAD FINDINGS Brain: Restricted diffusion in the left basal ganglia (series 3, image 32) with T2 and FLAIR hyperintensity. No associated hemorrhage. No significant mass effect. There is a small 9 millimeter area of restricted diffusion in the medial left temporal lobe at the amygdala (series 3, image 20). There are otherwise a small number of tiny scattered foci of restricted diffusion in the left MCA territory (such as series 3, image 40). There is no right hemisphere or posterior circulation restricted diffusion. Patchy and scattered bilateral cerebral white matter T2 and FLAIR hyperintensity elsewhere is chronic and stable. The right deep gray matter nuclei, left thalamus, brainstem, and cerebellum remain normal for age. No midline shift, mass effect, evidence of mass lesion, ventriculomegaly, extra-axial collection or acute intracranial hemorrhage. Cervicomedullary junction and pituitary are within normal limits. Vascular: Major intracranial vascular flow voids are preserved, the left ICA flow void was abnormal in 2018 and appears mildly improved today. Negative visible cervical spine.  Normal bone marrow signal. There is mild leftward gaze deviation. Orbits soft tissues otherwise appear normal. Paranasal Visualized paranasal sinuses and mastoids are stable and well pneumatized. Scalp and face soft tissues appear negative. MRA HEAD FINDINGS Antegrade flow in the posterior circulation with codominant distal vertebral arteries. Mild distal vertebral irregularity with no stenosis. Patent vertebrobasilar junction without stenosis. Dominant appearing AICA origins are patent. Mild basilar artery irregularity without stenosis. SCA origins are patent. Bilateral PCA origins appear normal. Posterior communicating arteries are diminutive or absent. Bilateral PCA branches are mildly irregular, and there is mild right PCA P2 segment stenosis which is stable from earlier today. Antegrade  flow in both ICA siphons. Mild luminal narrowing and irregularity throughout the visible left ICA may reflect residual thrombus or plaque. The left ICA is patent to the left terminus. The left MCA and ACA origins are patent. There is mild to moderate irregularity at the left ACA origin and proximal A1 segment which is stable from earlier today. There is mild irregularity scratched at the left MCA and its branches now are patent. There is mild left M1 irregularity. The left MCA bifurcation is patent with no branch occlusion identified. Antegrade flow in the right ICA siphon which is only mildly irregular and without stenosis. Normal right ophthalmic artery origin. Normal right ICA terminus, right ACA and MCA origins. The anterior communicating artery is normal. The visible ACA branches are stable, with mild irregularity of the distal left ACA. The  right MCA M1 segment is patent with mild irregularity just proximal to the bifurcation which is stable. The right MCA branches are stable and within normal limits. IMPRESSION: 1. Restored patency of the Left ICA, terminus, and Left MCA since earlier today. Mild residual irregularity throughout the left ICA siphon, and in the left M1 segment. Stable mild to moderate irregularity in the left A1 and distal A2 segments. 2. Associated core infarct primarily confined to the Left Basal Ganglia. There is a small 9 mm infarct in the left amygdala, and there are small number of scattered tiny cortical infarcts elsewhere in the Left MCA territory. 3. Mild cytotoxic edema with no associated hemorrhage or mass effect. 4. 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 Electronically Signed   By: Genevie Ann M.D.   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  Personal history of prior whole brain radiation for cancer, but no known brain metastases. EXAM: MRI HEAD WITHOUT CONTRAST MRA HEAD WITHOUT CONTRAST TECHNIQUE: Multiplanar, multiecho pulse sequences of the brain and surrounding structures were obtained without intravenous contrast. Angiographic images of the head were obtained using MRA technique without contrast. COMPARISON:  Cerebral angiogram, CTA head and neck, and noncontrast head CT earlier today. Prior brain MRI 11/19/2016. FINDINGS: MRI HEAD FINDINGS Brain: Restricted diffusion in the left basal ganglia (series 3, image 32) with T2 and FLAIR hyperintensity. No associated hemorrhage. No significant mass effect. There is a small 9 millimeter area of restricted diffusion in the medial left temporal lobe at the amygdala (series 3, image 20). There are otherwise a small number of tiny scattered foci of restricted diffusion in the left MCA territory (such as series 3, image 40). There is no right hemisphere or posterior circulation restricted diffusion. Patchy and scattered bilateral cerebral white matter T2 and FLAIR hyperintensity elsewhere is chronic and stable. The right deep gray matter nuclei, left thalamus, brainstem, and cerebellum remain normal for age. No midline shift, mass effect, evidence of mass lesion, ventriculomegaly, extra-axial collection or acute intracranial hemorrhage. Cervicomedullary junction and pituitary are within normal limits. Vascular: Major intracranial vascular flow voids are preserved, the left ICA flow void was abnormal in 2018 and appears mildly improved today. Negative visible cervical spine.  Normal bone marrow signal. There is mild leftward gaze deviation. Orbits soft tissues otherwise appear normal. Paranasal Visualized paranasal sinuses and mastoids are stable and well pneumatized. Scalp and face soft tissues appear negative. MRA HEAD FINDINGS Antegrade flow in the posterior circulation with codominant distal vertebral  arteries. Mild distal vertebral irregularity with no stenosis. Patent vertebrobasilar junction without stenosis. Dominant appearing AICA origins are patent. Mild basilar artery irregularity without stenosis. SCA origins are patent. Bilateral PCA origins appear normal. Posterior communicating arteries are diminutive or absent. Bilateral PCA branches are mildly irregular, and there is mild right PCA P2 segment stenosis which is stable from earlier today. Antegrade flow in both ICA siphons. Mild luminal narrowing and irregularity throughout the visible left ICA may reflect residual thrombus or plaque. The left ICA is patent to the left terminus. The left MCA and ACA origins are patent. There is mild to moderate irregularity at the left ACA origin and proximal A1 segment which is stable from earlier today. There is mild irregularity scratched at the left MCA and its branches now are patent.  There is mild left M1 irregularity. The left MCA bifurcation is patent with no branch occlusion identified. Antegrade flow in the right ICA siphon which is only mildly irregular and without stenosis. Normal right ophthalmic artery origin. Normal right ICA terminus, right ACA and MCA origins. The anterior communicating artery is normal. The visible ACA branches are stable, with mild irregularity of the distal left ACA. The right MCA M1 segment is patent with mild irregularity just proximal to the bifurcation which is stable. The right MCA branches are stable and within normal limits. IMPRESSION: 1. Restored patency of the Left ICA, terminus, and Left MCA since earlier today. Mild residual irregularity throughout the left ICA siphon, and in the left M1 segment. Stable mild to moderate irregularity in the left A1 and distal A2 segments. 2. Associated core infarct primarily confined to the Left Basal Ganglia. There is a small 9 mm infarct in the left amygdala, and there are small number of scattered tiny cortical infarcts elsewhere in  the Left MCA territory. 3. Mild cytotoxic edema with no associated hemorrhage or mass effect. 4. 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 Electronically Signed   By: Genevie Ann M.D.   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 The antibiotic was administered within 1 hour of the procedure 75 MCG NITROGLYCERIN INTRA ARTERIAL 650 mg aspirin via orogastric tube. Intra procedural initiation of Integrilin IV drip for dual anti-platelet therapy. ANESTHESIA/SEDATION: General endotracheal tube anesthesia with anesthesia team CONTRAST:  144 cc IV contrast FLUOROSCOPY TIME:  Fluoroscopy Time: 54 minutes 6 seconds (2,926 mGy). COMPLICATIONS: None TECHNIQUE: Informed written consent was obtained from the patient's family after a thorough discussion of the procedural risks, benefits and alternatives. Specific risks discussed include: Bleeding, infection, contrast reaction, kidney injury/failure, need for further procedure/surgery, arterial injury or dissection, embolization to new territory, intracranial hemorrhage (10-15%  risk), neurologic deterioration, cardiopulmonary collapse, death. All questions were addressed. Maximal Sterile Barrier Technique was utilized including during the procedure including caps, mask, sterile gowns, sterile gloves, sterile drape, hand hygiene and skin antiseptic. A timeout was performed prior to the initiation of the procedure. FINDINGS: Initial angiogram: Left common carotid artery:  Normal course caliber and contour. Left external carotid artery: Patent with antegrade flow. On the initial injection before treatment, there is minimal opacification of the carotid siphon secondary to meningeal branches which appear to be region ating from ascending pharyngeal distribution and potentially deep meningeal branches from the external carotid artery. No significant filling of the MCA on the initial injection The initial injection does confirmed perfusion of the lenticulostriate vasculature, as well as a patent anterior coronal artery, with minimal filling of temporal lobe. Anterior basal ganglia structures are perfused. Left internal carotid artery: Internal carotid  artery is occluded at the origin with a string sign at the posterior aspect of the artery. Left MCA: Initial injection demonstrates no significant filling of the middle cerebral artery. 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 Left ACA: Anterior cerebral artery perfused by the right-sided flow at the initiation. Final angiogram: After treatment of the carotid occlusion and deployment of 8 mm-6 mm x 40 mm carotid stent, there is excellent flow through the cervical carotid to the intracranial carotid. After mechanical thrombectomy of M1 occlusion, there is modified treatment in cerebral ischemia of 3. Flat panel CT: No complications with small contrast stain of the left basal ganglia. PROCEDURE: Patient was brought to the neurointerventional suite, with the patient identity confirmed, and  consent form confirmed. General anesthesia was present for initiation of general endotracheal tube anesthesia. Patient was prepped and draped in the usual sterile fashion. The only venous access was a port catheter with single needle access. Ultrasound survey of the right inguinal region was performed with images stored and sent to PACs. 11 blade scalpel was used to make a small incision. A micropuncture needle was used access the right common femoral artery under ultrasound. With excellent arterial blood flow returned, an .018 micro wire was passed through the needle, observed to enter the abdominal aorta under fluoroscopy. The needle was removed, and a micropuncture sheath was placed over the wire. The inner dilator and wire were removed, and an 035 Bentson wire was advanced under fluoroscopy into the abdominal aorta. The sheath was removed and a standard 5 [place] vascular sheath was placed. The dilator was removed and the sheath was flushed. After the initial common femoral artery access, ultrasound survey of the left inguinal region was performed in order to place arterial monitoring line. Images stored and sent to PACs. Micropuncture needle was used access the left common femoral artery under ultrasound. With excellent arterial blood flow returned, an .018 micro wire was passed through the needle, observed to enter the abdominal aorta under fluoroscopy. The needle was removed, and a micropuncture sheath was placed over the wire. The inner dilator and wire were removed, and an 035 Bentson wire was advanced under fluoroscopy into the abdominal aorta. The sheath was removed and a standard 4 [place] vascular sheath was placed. The dilator was removed and the sheath was flushed. A 25F JB-1 diagnostic catheter was advanced over the wire through the right common femoral artery access to the proximal descending thoracic aorta. Wire was then removed. Double flush of the catheter was performed. Catheter was then used to  select the left common carotid artery. Angiogram was performed. Using roadmap technique, the catheter was advanced over a roadrunner wire into right common carotid artery. Formal angiogram was performed. Catheter was advanced over the roadrunner wire into the external carotid artery. Wire was removed. Exchange length Rosen wire was then passed through the diagnostic catheter to the distal common carotid artery and the diagnostic catheter was removed. The 5 French sheath was removed and exchanged for 8 French 55 centimeter BrightTip sheath. Sheath was flushed and attached to pressurized and heparinized saline bag for constant forward flow. Then an 8 [place], 95 cm Flowgate balloon tip catheter was prepared on the back table with inflation of the balloon with 50/50 concentration of dilute contrast. The balloon catheter was then advanced over the wire, positioned into the distal common carotid artery. Copious back flush was performed and the balloon catheter was attached to heparinized and  pressurized saline bag for forward flow. Roadmap was then performed at the carotid bifurcation. Combination of a rapid transit microwire and a synchro soft wire were used to navigate beyond the occlusion at the proximal internal carotid artery. The microcatheter was then navigated into the distal cervical segment, wire was removed, blood was aspirated, and a gentle injection confirmed a luminal position. Exchange length Transcend wire was then placed through the microcatheter into the cervical carotid. Microcatheter was removed. Balloon angioplasty was then performed with a rapid exchange system, using a Viatrac balloon 5 mm x 30 mm. The patient required treatment at this time with atropine dose, as we experienced bradycardia into the 30s. His heart rate recovered after treatment by the anesthesia team. Balloon was removed. Angiogram confirmed flow into the cervical carotid. A coaxial system of an Ace 64 penumbra aspiration catheter  and the Trevo pro view 18 catheter was then advanced over the exchange length wire into the cervical carotid. Once the coaxial system was in the cervical carotid, the microwire was removed and exchanged for synchro soft. The synchro soft wire was navigated beyond the occlusion under roadmap guidance, and the aspiration catheter was advanced into the ICA terminus. The balloon guide catheter was then advanced into the mid segment of the cervical carotid artery. The microcatheter and microwire were then carefully advanced through the occluded segment. Microcatheter was then push through the occluded segment and the wire was removed. Blood was then aspirated through the hub of the microcatheter, and a gentle contrast injection was performed confirming intraluminal position. A rotating hemostatic valve was then attached to the back end of the microcatheter, and a pressurized and heparinized saline bag was attached to the catheter. 4 x 40 solitaire device was then selected. Back flush was achieved at the rotating hemostatic valve, and then the device was gently advanced through the microcatheter to the distal end. The retriever was then unsheathed by withdrawing the microcatheter under fluoroscopy. A 5 minutes interval was observed. At this time, ultrasound-guided access of the right common femoral vein was performed for placement of a additional venous access and placement of a 12 French triple-lumen IV catheter. We then proceeded with the mechanical thrombectomy. The balloon at the balloon tip catheter was then inflated under fluoroscopy for proximal flow arrest. Constant aspiration was then performed at the penumbra aspiration catheter at the carotid terminus as the retriever was gently and slowly withdrawn with fluoroscopic observation. Once the retriever was entirely removed from the system, free aspiration was confirmed at the hub of the aspiration catheter, with free blood return confirmed. The balloon was then  deflated, rotating hemostatic valve was reattached, and a control angiogram was performed, confirming restoration of flow. Penumbra catheter was then withdrawn, and the angiogram was performed at the carotid bifurcation. Given the appearance, we elected to place acute carotid stent. The lesion was then again navigated with synchro soft wire and rapid transit. With the microwire microcatheter positioned in the distal cervical carotid, the microwire was removed. The exchange length Transcend wire was then replaced and the microcatheter was removed. Acute carotid stent was then deployed, 8 mm-6 mm x 40 mm length. Before deployment, we initiated Integrilin drip treatment for dual anti-platelet therapy. Angiogram was performed. Coaxial system of the penumbra aspiration catheter and the pro view 18 catheter were then navigated to the carotid siphon for better angiogram of the cerebral vasculature. Angiogram was performed. Microcatheter, a number aspiration catheter were withdrawn. The balloon guide catheter was positioned in the common  carotid artery for final angiogram. All catheter and wires were removed. The sheath at the right common femoral artery access was withdrawn for an angiogram. Eight French Angio-Seal was then deployed for hemostasis. A flat panel CT was then performed.  Patient was then extubated. Estimated blood loss was 50 cc No complications encountered. IMPRESSION: Status post left-sided cerebral angiogram with treatment of left ICA/MCA tandem occlusion, requiring acute left carotid stenting and mechanical thrombectomy of a left M1 occlusion with achievement of mTICI-3 flow, and restoration of cervical ICA flow. Flat panel CT in the room demonstrates small contrast stain without complicating features, and the patient was extubated. Status post deployment of 8 French Angio-Seal for right common femoral artery hemostasis. Status post ultrasound-guided left common femoral artery access with a 4 French  sheath for arterial monitoring, which remained after the case. Status post ultrasound-guided right common femoral vein access for additional IV access, which remained after the case transmitting the Integrilin drip. Signed, Dulcy Fanny. Earleen Newport, DO [facility] Radiology PLAN: Patient will go to the PACU ICU admission Bilateral hips will be straight until the left 4 French arterial monitoring sheath is removed and the right common femoral vein venous access sheath is removed. Routine wound management for the right common femoral artery sheath access. Frequent neurovascular checks. IV saline hydration for 8 hours given the renal insufficiency Blood pressure control management of 120-140 with nicardipine drip ordered The goal will be dual anti-platelet therapy, for now with dose of 650 mg aspirin and the Integrilin drip. Electronically Signed   By: Corrie Mckusick D.O.   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 The antibiotic was administered within 1 hour of the procedure 75 MCG NITROGLYCERIN INTRA  ARTERIAL 650 mg aspirin via orogastric tube. Intra procedural initiation of Integrilin IV drip for dual anti-platelet therapy. ANESTHESIA/SEDATION: General endotracheal tube anesthesia with anesthesia team CONTRAST:  144 cc IV contrast FLUOROSCOPY TIME:  Fluoroscopy Time: 54 minutes 6 seconds (2,926 mGy). COMPLICATIONS: None TECHNIQUE: Informed written consent was obtained from the patient's family after a thorough discussion of the procedural risks, benefits and alternatives. Specific risks discussed include: Bleeding, infection, contrast reaction, kidney injury/failure, need for further procedure/surgery, arterial injury or dissection, embolization to new territory, intracranial hemorrhage (10-15% risk), neurologic deterioration, cardiopulmonary collapse, death. All questions were addressed. Maximal Sterile Barrier Technique was utilized including during the procedure including caps, mask, sterile gowns, sterile gloves, sterile drape, hand hygiene and skin antiseptic. A timeout was performed prior to the initiation of the procedure. FINDINGS: Initial angiogram: Left common carotid artery:  Normal course caliber and contour. Left external carotid artery: Patent with antegrade flow. On the initial injection before treatment, there is minimal opacification of the carotid siphon secondary to  meningeal branches which appear to be region ating from ascending pharyngeal distribution and potentially deep meningeal branches from the external carotid artery. No significant filling of the MCA on the initial injection The initial injection does confirmed perfusion of the lenticulostriate vasculature, as well as a patent anterior coronal artery, with minimal filling of temporal lobe. Anterior basal ganglia structures are perfused. Left internal carotid artery: Internal carotid artery is occluded at the origin with a string sign at the posterior aspect of the artery. Left MCA: Initial injection demonstrates no significant  filling of the middle cerebral artery. 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 Left ACA: Anterior cerebral artery perfused by the right-sided flow at the initiation. Final angiogram: After treatment of the carotid occlusion and deployment of 8 mm-6 mm x 40 mm carotid stent, there is excellent flow through the cervical carotid to the intracranial carotid. After mechanical thrombectomy of M1 occlusion, there is modified treatment in cerebral ischemia of 3. Flat panel CT: No complications with small contrast stain of the left basal ganglia. PROCEDURE: Patient was brought to the neurointerventional suite, with the patient identity confirmed, and consent form confirmed. General anesthesia was present for initiation of general endotracheal tube anesthesia. Patient was prepped and draped in the usual sterile fashion. The only venous access was a port catheter with single needle access. Ultrasound survey of the right inguinal region was performed with images stored and sent to PACs. 11 blade scalpel was used to make a small incision. A micropuncture needle was used access the right common femoral artery under ultrasound. With excellent arterial blood flow returned, an .018 micro wire was passed through the needle, observed to enter the abdominal aorta under fluoroscopy. The needle was removed, and a micropuncture sheath was placed over the wire. The inner dilator and wire were removed, and an 035 Bentson wire was advanced under fluoroscopy into the abdominal aorta. The sheath was removed and a standard 5 [place] vascular sheath was placed. The dilator was removed and the sheath was flushed. After the initial common femoral artery access, ultrasound survey of the left inguinal region was performed in order to place arterial monitoring line. Images stored and sent to PACs. Micropuncture needle was used access the left common femoral artery under ultrasound. With  excellent arterial blood flow returned, an .018 micro wire was passed through the needle, observed to enter the abdominal aorta under fluoroscopy. The needle was removed, and a micropuncture sheath was placed over the wire. The inner dilator and wire were removed, and an 035 Bentson wire was advanced under fluoroscopy into the abdominal aorta. The sheath was removed and a standard 4 [place] vascular sheath was placed. The dilator was removed and the sheath was flushed. A 70F JB-1 diagnostic catheter was advanced over the wire through the right common femoral artery access to the proximal descending thoracic aorta. Wire was then removed. Double flush of the catheter was performed. Catheter was then used to select the left common carotid artery. Angiogram was performed. Using roadmap technique, the catheter was advanced over a roadrunner wire into right common carotid artery. Formal angiogram was performed. Catheter was advanced over the roadrunner wire into the external carotid artery. Wire was removed. Exchange length Rosen wire was then passed through the diagnostic catheter to the distal common carotid artery and the diagnostic catheter was removed. The 5 French sheath was removed and exchanged for 8 French 55 centimeter BrightTip sheath. Sheath was flushed and  attached to pressurized and heparinized saline bag for constant forward flow. Then an 8 [place], 95 cm Flowgate balloon tip catheter was prepared on the back table with inflation of the balloon with 50/50 concentration of dilute contrast. The balloon catheter was then advanced over the wire, positioned into the distal common carotid artery. Copious back flush was performed and the balloon catheter was attached to heparinized and pressurized saline bag for forward flow. Roadmap was then performed at the carotid bifurcation. Combination of a rapid transit microwire and a synchro soft wire were used to navigate beyond the occlusion at the proximal internal  carotid artery. The microcatheter was then navigated into the distal cervical segment, wire was removed, blood was aspirated, and a gentle injection confirmed a luminal position. Exchange length Transcend wire was then placed through the microcatheter into the cervical carotid. Microcatheter was removed. Balloon angioplasty was then performed with a rapid exchange system, using a Viatrac balloon 5 mm x 30 mm. The patient required treatment at this time with atropine dose, as we experienced bradycardia into the 30s. His heart rate recovered after treatment by the anesthesia team. Balloon was removed. Angiogram confirmed flow into the cervical carotid. A coaxial system of an Ace 64 penumbra aspiration catheter and the Trevo pro view 18 catheter was then advanced over the exchange length wire into the cervical carotid. Once the coaxial system was in the cervical carotid, the microwire was removed and exchanged for synchro soft. The synchro soft wire was navigated beyond the occlusion under roadmap guidance, and the aspiration catheter was advanced into the ICA terminus. The balloon guide catheter was then advanced into the mid segment of the cervical carotid artery. The microcatheter and microwire were then carefully advanced through the occluded segment. Microcatheter was then push through the occluded segment and the wire was removed. Blood was then aspirated through the hub of the microcatheter, and a gentle contrast injection was performed confirming intraluminal position. A rotating hemostatic valve was then attached to the back end of the microcatheter, and a pressurized and heparinized saline bag was attached to the catheter. 4 x 40 solitaire device was then selected. Back flush was achieved at the rotating hemostatic valve, and then the device was gently advanced through the microcatheter to the distal end. The retriever was then unsheathed by withdrawing the microcatheter under fluoroscopy. A 5 minutes  interval was observed. At this time, ultrasound-guided access of the right common femoral vein was performed for placement of a additional venous access and placement of a 12 French triple-lumen IV catheter. We then proceeded with the mechanical thrombectomy. The balloon at the balloon tip catheter was then inflated under fluoroscopy for proximal flow arrest. Constant aspiration was then performed at the penumbra aspiration catheter at the carotid terminus as the retriever was gently and slowly withdrawn with fluoroscopic observation. Once the retriever was entirely removed from the system, free aspiration was confirmed at the hub of the aspiration catheter, with free blood return confirmed. The balloon was then deflated, rotating hemostatic valve was reattached, and a control angiogram was performed, confirming restoration of flow. Penumbra catheter was then withdrawn, and the angiogram was performed at the carotid bifurcation. Given the appearance, we elected to place acute carotid stent. The lesion was then again navigated with synchro soft wire and rapid transit. With the microwire microcatheter positioned in the distal cervical carotid, the microwire was removed. The exchange length Transcend wire was then replaced and the microcatheter was removed. Acute carotid stent was then  deployed, 8 mm-6 mm x 40 mm length. Before deployment, we initiated Integrilin drip treatment for dual anti-platelet therapy. Angiogram was performed. Coaxial system of the penumbra aspiration catheter and the pro view 18 catheter were then navigated to the carotid siphon for better angiogram of the cerebral vasculature. Angiogram was performed. Microcatheter, a number aspiration catheter were withdrawn. The balloon guide catheter was positioned in the common carotid artery for final angiogram. All catheter and wires were removed. The sheath at the right common femoral artery access was withdrawn for an angiogram. Eight French Angio-Seal  was then deployed for hemostasis. A flat panel CT was then performed.  Patient was then extubated. Estimated blood loss was 50 cc No complications encountered. IMPRESSION: Status post left-sided cerebral angiogram with treatment of left ICA/MCA tandem occlusion, requiring acute left carotid stenting and mechanical thrombectomy of a left M1 occlusion with achievement of mTICI-3 flow, and restoration of cervical ICA flow. Flat panel CT in the room demonstrates small contrast stain without complicating features, and the patient was extubated. Status post deployment of 8 French Angio-Seal for right common femoral artery hemostasis. Status post ultrasound-guided left common femoral artery access with a 4 French sheath for arterial monitoring, which remained after the case. Status post ultrasound-guided right common femoral vein access for additional IV access, which remained after the case transmitting the Integrilin drip. Signed, Dulcy Fanny. Earleen Newport, DO [facility] Radiology PLAN: Patient will go to the PACU ICU admission Bilateral hips will be straight until the left 4 French arterial monitoring sheath is removed and the right common femoral vein venous access sheath is removed. Routine wound management for the right common femoral artery sheath access. Frequent neurovascular checks. IV saline hydration for 8 hours given the renal insufficiency Blood pressure control management of 120-140 with nicardipine drip ordered The goal will be dual anti-platelet therapy, for now with dose of 650 mg aspirin and the Integrilin drip. Electronically Signed   By: Corrie Mckusick D.O.   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 The antibiotic was administered within 1 hour of the procedure 75 MCG NITROGLYCERIN INTRA ARTERIAL 650 mg aspirin via orogastric tube. Intra procedural initiation of Integrilin IV drip for dual anti-platelet therapy. ANESTHESIA/SEDATION: General endotracheal tube anesthesia with anesthesia team CONTRAST:  144 cc IV contrast FLUOROSCOPY TIME:  Fluoroscopy Time: 54 minutes 6 seconds (2,926 mGy). COMPLICATIONS: None TECHNIQUE: Informed written consent was obtained from the patient's family after a thorough discussion of the procedural risks, benefits and alternatives. Specific risks discussed include: Bleeding, infection, contrast reaction, kidney injury/failure, need for further procedure/surgery, arterial injury or dissection, embolization to new territory, intracranial hemorrhage (10-15% risk), neurologic deterioration, cardiopulmonary collapse, death. All questions were addressed. Maximal Sterile Barrier Technique was utilized including  during the procedure including caps, mask, sterile gowns, sterile gloves, sterile drape, hand hygiene and skin antiseptic. A timeout was performed prior to the initiation of the procedure. FINDINGS: Initial angiogram: Left common carotid artery:  Normal course caliber and contour. Left external carotid artery: Patent with antegrade flow. On the initial injection before treatment, there is minimal  opacification of the carotid siphon secondary to meningeal branches which appear to be region ating from ascending pharyngeal distribution and potentially deep meningeal branches from the external carotid artery. No significant filling of the MCA on the initial injection The initial injection does confirmed perfusion of the lenticulostriate vasculature, as well as a patent anterior coronal artery, with minimal filling of temporal lobe. Anterior basal ganglia structures are perfused. Left internal carotid artery: Internal carotid artery is occluded at the origin with a string sign at the posterior aspect of the artery. Left MCA: Initial injection demonstrates no significant filling of the middle cerebral artery. 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 Left ACA: Anterior cerebral artery perfused by the right-sided flow at the initiation. Final angiogram: After treatment of the carotid occlusion and deployment of 8 mm-6 mm x 40 mm carotid stent, there is excellent flow through the cervical carotid to the intracranial carotid. After mechanical thrombectomy of M1 occlusion, there is modified treatment in cerebral ischemia of 3. Flat panel CT: No complications with small contrast stain of the left basal ganglia. PROCEDURE: Patient was brought to the neurointerventional suite, with the patient identity confirmed, and consent form confirmed. General anesthesia was present for initiation of general endotracheal tube anesthesia. Patient was prepped and draped in the usual sterile fashion. The only venous access was a port catheter with single needle access. Ultrasound survey of the right inguinal region was performed with images stored and sent to PACs. 11 blade scalpel was used to make a small incision. A micropuncture needle was used access the right common femoral artery under ultrasound. With excellent arterial blood flow returned, an .018 micro wire was  passed through the needle, observed to enter the abdominal aorta under fluoroscopy. The needle was removed, and a micropuncture sheath was placed over the wire. The inner dilator and wire were removed, and an 035 Bentson wire was advanced under fluoroscopy into the abdominal aorta. The sheath was removed and a standard 5 [place] vascular sheath was placed. The dilator was removed and the sheath was flushed. After the initial common femoral artery access, ultrasound survey of the left inguinal region was performed in order to place arterial monitoring line. Images stored and sent to PACs. Micropuncture needle was used access the left common femoral artery under ultrasound. With excellent arterial blood flow returned, an .018 micro wire was passed through the needle, observed to enter the abdominal aorta under fluoroscopy. The needle was removed, and a micropuncture sheath was placed over the wire. The inner dilator and wire were removed, and an 035 Bentson wire was advanced under fluoroscopy into the abdominal aorta. The sheath was removed and a standard 4 [place] vascular sheath was placed. The dilator was removed and the sheath was flushed. A 61F JB-1 diagnostic catheter was advanced over the wire through the right common femoral artery access to the proximal descending thoracic aorta. Wire was then removed. Double flush of the catheter was performed. Catheter was then used to select the left common carotid artery. Angiogram was performed. Using roadmap technique, the catheter was advanced over a roadrunner wire into  right common carotid artery. Formal angiogram was performed. Catheter was advanced over the roadrunner wire into the external carotid artery. Wire was removed. Exchange length Rosen wire was then passed through the diagnostic catheter to the distal common carotid artery and the diagnostic catheter was removed. The 5 French sheath was removed and exchanged for 8 French 55 centimeter BrightTip sheath.  Sheath was flushed and attached to pressurized and heparinized saline bag for constant forward flow. Then an 8 [place], 95 cm Flowgate balloon tip catheter was prepared on the back table with inflation of the balloon with 50/50 concentration of dilute contrast. The balloon catheter was then advanced over the wire, positioned into the distal common carotid artery. Copious back flush was performed and the balloon catheter was attached to heparinized and pressurized saline bag for forward flow. Roadmap was then performed at the carotid bifurcation. Combination of a rapid transit microwire and a synchro soft wire were used to navigate beyond the occlusion at the proximal internal carotid artery. The microcatheter was then navigated into the distal cervical segment, wire was removed, blood was aspirated, and a gentle injection confirmed a luminal position. Exchange length Transcend wire was then placed through the microcatheter into the cervical carotid. Microcatheter was removed. Balloon angioplasty was then performed with a rapid exchange system, using a Viatrac balloon 5 mm x 30 mm. The patient required treatment at this time with atropine dose, as we experienced bradycardia into the 30s. His heart rate recovered after treatment by the anesthesia team. Balloon was removed. Angiogram confirmed flow into the cervical carotid. A coaxial system of an Ace 64 penumbra aspiration catheter and the Trevo pro view 18 catheter was then advanced over the exchange length wire into the cervical carotid. Once the coaxial system was in the cervical carotid, the microwire was removed and exchanged for synchro soft. The synchro soft wire was navigated beyond the occlusion under roadmap guidance, and the aspiration catheter was advanced into the ICA terminus. The balloon guide catheter was then advanced into the mid segment of the cervical carotid artery. The microcatheter and microwire were then carefully advanced through the occluded  segment. Microcatheter was then push through the occluded segment and the wire was removed. Blood was then aspirated through the hub of the microcatheter, and a gentle contrast injection was performed confirming intraluminal position. A rotating hemostatic valve was then attached to the back end of the microcatheter, and a pressurized and heparinized saline bag was attached to the catheter. 4 x 40 solitaire device was then selected. Back flush was achieved at the rotating hemostatic valve, and then the device was gently advanced through the microcatheter to the distal end. The retriever was then unsheathed by withdrawing the microcatheter under fluoroscopy. A 5 minutes interval was observed. At this time, ultrasound-guided access of the right common femoral vein was performed for placement of a additional venous access and placement of a 12 French triple-lumen IV catheter. We then proceeded with the mechanical thrombectomy. The balloon at the balloon tip catheter was then inflated under fluoroscopy for proximal flow arrest. Constant aspiration was then performed at the penumbra aspiration catheter at the carotid terminus as the retriever was gently and slowly withdrawn with fluoroscopic observation. Once the retriever was entirely removed from the system, free aspiration was confirmed at the hub of the aspiration catheter, with free blood return confirmed. The balloon was then deflated, rotating hemostatic valve was reattached, and a control angiogram was performed, confirming restoration of flow. Penumbra catheter was then  withdrawn, and the angiogram was performed at the carotid bifurcation. Given the appearance, we elected to place acute carotid stent. The lesion was then again navigated with synchro soft wire and rapid transit. With the microwire microcatheter positioned in the distal cervical carotid, the microwire was removed. The exchange length Transcend wire was then replaced and the microcatheter was  removed. Acute carotid stent was then deployed, 8 mm-6 mm x 40 mm length. Before deployment, we initiated Integrilin drip treatment for dual anti-platelet therapy. Angiogram was performed. Coaxial system of the penumbra aspiration catheter and the pro view 18 catheter were then navigated to the carotid siphon for better angiogram of the cerebral vasculature. Angiogram was performed. Microcatheter, a number aspiration catheter were withdrawn. The balloon guide catheter was positioned in the common carotid artery for final angiogram. All catheter and wires were removed. The sheath at the right common femoral artery access was withdrawn for an angiogram. Eight French Angio-Seal was then deployed for hemostasis. A flat panel CT was then performed.  Patient was then extubated. Estimated blood loss was 50 cc No complications encountered. IMPRESSION: Status post left-sided cerebral angiogram with treatment of left ICA/MCA tandem occlusion, requiring acute left carotid stenting and mechanical thrombectomy of a left M1 occlusion with achievement of mTICI-3 flow, and restoration of cervical ICA flow. Flat panel CT in the room demonstrates small contrast stain without complicating features, and the patient was extubated. Status post deployment of 8 French Angio-Seal for right common femoral artery hemostasis. Status post ultrasound-guided left common femoral artery access with a 4 French sheath for arterial monitoring, which remained after the case. Status post ultrasound-guided right common femoral vein access for additional IV access, which remained after the case transmitting the Integrilin drip. Signed, Dulcy Fanny. Earleen Newport, DO [facility] Radiology PLAN: Patient will go to the PACU ICU admission Bilateral hips will be straight until the left 4 French arterial monitoring sheath is removed and the right common femoral vein venous access sheath is removed. Routine wound management  for the right common femoral artery sheath access. Frequent neurovascular checks. IV saline hydration for 8 hours given the renal insufficiency Blood pressure control management of 120-140 with nicardipine drip ordered The goal will be dual anti-platelet therapy, for now with dose of 650 mg aspirin and the Integrilin drip. Electronically Signed   By: Corrie Mckusick D.O.   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 The antibiotic was administered within 1 hour of the procedure 75 MCG NITROGLYCERIN INTRA ARTERIAL 650 mg aspirin via orogastric tube. Intra procedural initiation of Integrilin IV drip for dual anti-platelet therapy. ANESTHESIA/SEDATION: General endotracheal tube anesthesia with anesthesia team CONTRAST:  144 cc IV contrast FLUOROSCOPY TIME:  Fluoroscopy Time: 54 minutes 6 seconds (2,926 mGy).  COMPLICATIONS: None TECHNIQUE: Informed written consent was obtained from the patient's family after a thorough discussion of the procedural risks, benefits and alternatives. Specific risks discussed include: Bleeding, infection, contrast reaction, kidney  injury/failure, need for further procedure/surgery, arterial injury or dissection, embolization to new territory, intracranial hemorrhage (10-15% risk), neurologic deterioration, cardiopulmonary collapse, death. All questions were addressed. Maximal Sterile Barrier Technique was utilized including during the procedure including caps, mask, sterile gowns, sterile gloves, sterile drape, hand hygiene and skin antiseptic. A timeout was performed prior to the initiation of the procedure. FINDINGS: Initial angiogram: Left common carotid artery:  Normal course caliber and contour. Left external carotid artery: Patent with antegrade flow. On the initial injection before treatment, there is minimal opacification of the carotid siphon secondary to meningeal branches which appear to be region ating from ascending pharyngeal distribution and potentially deep meningeal branches from the external carotid artery. No significant filling of the MCA on the initial injection The initial injection does confirmed perfusion of the lenticulostriate vasculature, as well as a patent anterior coronal artery, with minimal filling of temporal lobe. Anterior basal ganglia structures are perfused. Left internal carotid artery: Internal carotid artery is occluded at the origin with a string sign at the posterior aspect of the artery. Left MCA: Initial injection demonstrates no significant filling of the middle cerebral artery. 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 Left ACA: Anterior cerebral artery perfused by the right-sided flow at the initiation. Final angiogram: After treatment of the carotid occlusion and deployment of 8 mm-6 mm x 40 mm carotid stent, there is excellent flow through the cervical carotid to the intracranial carotid. After mechanical thrombectomy of M1 occlusion, there is modified treatment in cerebral ischemia of 3. Flat panel CT: No complications with small  contrast stain of the left basal ganglia. PROCEDURE: Patient was brought to the neurointerventional suite, with the patient identity confirmed, and consent form confirmed. General anesthesia was present for initiation of general endotracheal tube anesthesia. Patient was prepped and draped in the usual sterile fashion. The only venous access was a port catheter with single needle access. Ultrasound survey of the right inguinal region was performed with images stored and sent to PACs. 11 blade scalpel was used to make a small incision. A micropuncture needle was used access the right common femoral artery under ultrasound. With excellent arterial blood flow returned, an .018 micro wire was passed through the needle, observed to enter the abdominal aorta under fluoroscopy. The needle was removed, and a micropuncture sheath was placed over the wire. The inner dilator and wire were removed, and an 035 Bentson wire was advanced under fluoroscopy into the abdominal aorta. The sheath was removed and a standard 5 [place] vascular sheath was placed. The dilator was removed and the sheath was flushed. After the initial common femoral artery access, ultrasound survey of the left inguinal region was performed in order to place arterial monitoring line. Images stored and sent to PACs. Micropuncture needle was used access the left common femoral artery under ultrasound. With excellent arterial blood flow returned, an .018 micro wire was passed through the needle, observed to enter the abdominal aorta under fluoroscopy. The needle was removed, and a micropuncture sheath was placed over the wire. The inner dilator and wire were removed, and an 035 Bentson wire was advanced under fluoroscopy into the abdominal aorta. The sheath was removed and a standard 4 [place] vascular sheath was placed. The dilator was removed  and the sheath was flushed. A 60F JB-1 diagnostic catheter was advanced over the wire through the right common femoral  artery access to the proximal descending thoracic aorta. Wire was then removed. Double flush of the catheter was performed. Catheter was then used to select the left common carotid artery. Angiogram was performed. Using roadmap technique, the catheter was advanced over a roadrunner wire into right common carotid artery. Formal angiogram was performed. Catheter was advanced over the roadrunner wire into the external carotid artery. Wire was removed. Exchange length Rosen wire was then passed through the diagnostic catheter to the distal common carotid artery and the diagnostic catheter was removed. The 5 French sheath was removed and exchanged for 8 French 55 centimeter BrightTip sheath. Sheath was flushed and attached to pressurized and heparinized saline bag for constant forward flow. Then an 8 [place], 95 cm Flowgate balloon tip catheter was prepared on the back table with inflation of the balloon with 50/50 concentration of dilute contrast. The balloon catheter was then advanced over the wire, positioned into the distal common carotid artery. Copious back flush was performed and the balloon catheter was attached to heparinized and pressurized saline bag for forward flow. Roadmap was then performed at the carotid bifurcation. Combination of a rapid transit microwire and a synchro soft wire were used to navigate beyond the occlusion at the proximal internal carotid artery. The microcatheter was then navigated into the distal cervical segment, wire was removed, blood was aspirated, and a gentle injection confirmed a luminal position. Exchange length Transcend wire was then placed through the microcatheter into the cervical carotid. Microcatheter was removed. Balloon angioplasty was then performed with a rapid exchange system, using a Viatrac balloon 5 mm x 30 mm. The patient required treatment at this time with atropine dose, as we experienced bradycardia into the 30s. His heart rate recovered after treatment by the  anesthesia team. Balloon was removed. Angiogram confirmed flow into the cervical carotid. A coaxial system of an Ace 64 penumbra aspiration catheter and the Trevo pro view 18 catheter was then advanced over the exchange length wire into the cervical carotid. Once the coaxial system was in the cervical carotid, the microwire was removed and exchanged for synchro soft. The synchro soft wire was navigated beyond the occlusion under roadmap guidance, and the aspiration catheter was advanced into the ICA terminus. The balloon guide catheter was then advanced into the mid segment of the cervical carotid artery. The microcatheter and microwire were then carefully advanced through the occluded segment. Microcatheter was then push through the occluded segment and the wire was removed. Blood was then aspirated through the hub of the microcatheter, and a gentle contrast injection was performed confirming intraluminal position. A rotating hemostatic valve was then attached to the back end of the microcatheter, and a pressurized and heparinized saline bag was attached to the catheter. 4 x 40 solitaire device was then selected. Back flush was achieved at the rotating hemostatic valve, and then the device was gently advanced through the microcatheter to the distal end. The retriever was then unsheathed by withdrawing the microcatheter under fluoroscopy. A 5 minutes interval was observed. At this time, ultrasound-guided access of the right common femoral vein was performed for placement of a additional venous access and placement of a 12 French triple-lumen IV catheter. We then proceeded with the mechanical thrombectomy. The balloon at the balloon tip catheter was then inflated under fluoroscopy for proximal flow arrest. Constant aspiration was then performed at the penumbra aspiration  catheter at the carotid terminus as the retriever was gently and slowly withdrawn with fluoroscopic observation. Once the retriever was entirely  removed from the system, free aspiration was confirmed at the hub of the aspiration catheter, with free blood return confirmed. The balloon was then deflated, rotating hemostatic valve was reattached, and a control angiogram was performed, confirming restoration of flow. Penumbra catheter was then withdrawn, and the angiogram was performed at the carotid bifurcation. Given the appearance, we elected to place acute carotid stent. The lesion was then again navigated with synchro soft wire and rapid transit. With the microwire microcatheter positioned in the distal cervical carotid, the microwire was removed. The exchange length Transcend wire was then replaced and the microcatheter was removed. Acute carotid stent was then deployed, 8 mm-6 mm x 40 mm length. Before deployment, we initiated Integrilin drip treatment for dual anti-platelet therapy. Angiogram was performed. Coaxial system of the penumbra aspiration catheter and the pro view 18 catheter were then navigated to the carotid siphon for better angiogram of the cerebral vasculature. Angiogram was performed. Microcatheter, a number aspiration catheter were withdrawn. The balloon guide catheter was positioned in the common carotid artery for final angiogram. All catheter and wires were removed. The sheath at the right common femoral artery access was withdrawn for an angiogram. Eight French Angio-Seal was then deployed for hemostasis. A flat panel CT was then performed.  Patient was then extubated. Estimated blood loss was 50 cc No complications encountered. IMPRESSION: Status post left-sided cerebral angiogram with treatment of left ICA/MCA tandem occlusion, requiring acute left carotid stenting and mechanical thrombectomy of a left M1 occlusion with achievement of mTICI-3 flow, and restoration of cervical ICA flow. Flat panel CT in the room demonstrates small contrast stain without complicating features, and the patient was extubated. Status post deployment of  8 French Angio-Seal for right common femoral artery hemostasis. Status post ultrasound-guided left common femoral artery access with a 4 French sheath for arterial monitoring, which remained after the case. Status post ultrasound-guided right common femoral vein access for additional IV access, which remained after the case transmitting the Integrilin drip. Signed, Dulcy Fanny. Earleen Newport, DO [facility] Radiology PLAN: Patient will go to the PACU ICU admission Bilateral hips will be straight until the left 4 French arterial monitoring sheath is removed and the right common femoral vein venous access sheath is removed. Routine wound management for the right common femoral artery sheath access. Frequent neurovascular checks. IV saline hydration for 8 hours given the renal insufficiency Blood pressure control management of 120-140 with nicardipine drip ordered The goal will be dual anti-platelet therapy, for now with dose of 650 mg aspirin and the Integrilin drip. Electronically Signed   By: Corrie Mckusick D.O.   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 The antibiotic was administered within 1 hour of the procedure 75 MCG NITROGLYCERIN INTRA ARTERIAL 650 mg aspirin via orogastric tube. Intra procedural initiation of Integrilin IV drip for dual anti-platelet therapy. ANESTHESIA/SEDATION: General endotracheal tube anesthesia with anesthesia team CONTRAST:  144 cc IV contrast FLUOROSCOPY TIME:  Fluoroscopy Time: 54 minutes 6 seconds (2,926 mGy). COMPLICATIONS: None TECHNIQUE: Informed written consent was obtained from the patient's family after a thorough discussion of the procedural risks, benefits and alternatives. Specific risks discussed include: Bleeding, infection, contrast reaction, kidney injury/failure, need for further procedure/surgery, arterial injury or dissection, embolization to new territory, intracranial hemorrhage (10-15% risk), neurologic deterioration, cardiopulmonary collapse, death. All questions were addressed. Maximal Sterile Barrier Technique was utilized including during the procedure including caps, mask, sterile gowns, sterile gloves, sterile drape, hand hygiene and skin antiseptic. A timeout was performed prior to the initiation of the procedure. FINDINGS: Initial angiogram: Left common carotid artery:  Normal course caliber and contour. Left external carotid artery: Patent with antegrade flow. On the initial injection before treatment, there is minimal opacification of the carotid siphon secondary to meningeal branches which appear to be region ating from ascending pharyngeal distribution and potentially deep meningeal branches from the external carotid artery. No significant filling of the MCA on the initial injection The initial injection does confirmed perfusion of the lenticulostriate vasculature, as well as a patent anterior coronal artery, with minimal filling of temporal lobe. Anterior basal ganglia structures are perfused. Left internal carotid artery: Internal carotid artery is occluded at the  origin with a string sign at the posterior aspect of the artery. Left MCA: Initial injection demonstrates no significant filling of the middle cerebral artery. 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 Left ACA: Anterior cerebral artery perfused by the right-sided flow at the initiation. Final angiogram: After treatment of the carotid occlusion and deployment of 8 mm-6 mm x 40 mm carotid stent, there is excellent flow through the cervical carotid to the intracranial carotid. After mechanical thrombectomy of M1 occlusion, there is modified treatment in cerebral ischemia of 3. Flat panel CT: No complications with small contrast stain of the left basal ganglia. PROCEDURE: Patient was brought to the neurointerventional suite, with the patient identity confirmed, and consent form confirmed. General anesthesia was present for initiation of general endotracheal tube anesthesia. Patient was prepped and draped in the usual sterile fashion. The only venous access was a port catheter with single needle access. Ultrasound survey of the right inguinal region was performed with images stored and sent to PACs. 11 blade scalpel was used to make a small incision. A micropuncture needle was used access the right common femoral artery under ultrasound. With excellent arterial blood flow returned, an .018 micro wire was passed through the needle, observed to enter the abdominal aorta under fluoroscopy. The needle was removed, and a micropuncture sheath was placed over the wire. The inner dilator and wire were removed, and an 035 Bentson wire was advanced under fluoroscopy into the abdominal aorta. The sheath was removed and a standard 5 [place] vascular sheath was placed. The dilator was removed and the sheath was flushed. After the initial common femoral artery access, ultrasound survey of the left inguinal region was performed in order to place arterial monitoring line.  Images stored and sent to PACs. Micropuncture needle was used access the left common femoral artery under ultrasound. With excellent arterial blood flow returned, an .  018 micro wire was passed through the needle, observed to enter the abdominal aorta under fluoroscopy. The needle was removed, and a micropuncture sheath was placed over the wire. The inner dilator and wire were removed, and an 035 Bentson wire was advanced under fluoroscopy into the abdominal aorta. The sheath was removed and a standard 4 [place] vascular sheath was placed. The dilator was removed and the sheath was flushed. A 58F JB-1 diagnostic catheter was advanced over the wire through the right common femoral artery access to the proximal descending thoracic aorta. Wire was then removed. Double flush of the catheter was performed. Catheter was then used to select the left common carotid artery. Angiogram was performed. Using roadmap technique, the catheter was advanced over a roadrunner wire into right common carotid artery. Formal angiogram was performed. Catheter was advanced over the roadrunner wire into the external carotid artery. Wire was removed. Exchange length Rosen wire was then passed through the diagnostic catheter to the distal common carotid artery and the diagnostic catheter was removed. The 5 French sheath was removed and exchanged for 8 French 55 centimeter BrightTip sheath. Sheath was flushed and attached to pressurized and heparinized saline bag for constant forward flow. Then an 8 [place], 95 cm Flowgate balloon tip catheter was prepared on the back table with inflation of the balloon with 50/50 concentration of dilute contrast. The balloon catheter was then advanced over the wire, positioned into the distal common carotid artery. Copious back flush was performed and the balloon catheter was attached to heparinized and pressurized saline bag for forward flow. Roadmap was then performed at the carotid bifurcation. Combination of a  rapid transit microwire and a synchro soft wire were used to navigate beyond the occlusion at the proximal internal carotid artery. The microcatheter was then navigated into the distal cervical segment, wire was removed, blood was aspirated, and a gentle injection confirmed a luminal position. Exchange length Transcend wire was then placed through the microcatheter into the cervical carotid. Microcatheter was removed. Balloon angioplasty was then performed with a rapid exchange system, using a Viatrac balloon 5 mm x 30 mm. The patient required treatment at this time with atropine dose, as we experienced bradycardia into the 30s. His heart rate recovered after treatment by the anesthesia team. Balloon was removed. Angiogram confirmed flow into the cervical carotid. A coaxial system of an Ace 64 penumbra aspiration catheter and the Trevo pro view 18 catheter was then advanced over the exchange length wire into the cervical carotid. Once the coaxial system was in the cervical carotid, the microwire was removed and exchanged for synchro soft. The synchro soft wire was navigated beyond the occlusion under roadmap guidance, and the aspiration catheter was advanced into the ICA terminus. The balloon guide catheter was then advanced into the mid segment of the cervical carotid artery. The microcatheter and microwire were then carefully advanced through the occluded segment. Microcatheter was then push through the occluded segment and the wire was removed. Blood was then aspirated through the hub of the microcatheter, and a gentle contrast injection was performed confirming intraluminal position. A rotating hemostatic valve was then attached to the back end of the microcatheter, and a pressurized and heparinized saline bag was attached to the catheter. 4 x 40 solitaire device was then selected. Back flush was achieved at the rotating hemostatic valve, and then the device was gently advanced through the microcatheter to the  distal end. The retriever was then unsheathed by withdrawing the microcatheter under fluoroscopy. A  5 minutes interval was observed. At this time, ultrasound-guided access of the right common femoral vein was performed for placement of a additional venous access and placement of a 12 French triple-lumen IV catheter. We then proceeded with the mechanical thrombectomy. The balloon at the balloon tip catheter was then inflated under fluoroscopy for proximal flow arrest. Constant aspiration was then performed at the penumbra aspiration catheter at the carotid terminus as the retriever was gently and slowly withdrawn with fluoroscopic observation. Once the retriever was entirely removed from the system, free aspiration was confirmed at the hub of the aspiration catheter, with free blood return confirmed. The balloon was then deflated, rotating hemostatic valve was reattached, and a control angiogram was performed, confirming restoration of flow. Penumbra catheter was then withdrawn, and the angiogram was performed at the carotid bifurcation. Given the appearance, we elected to place acute carotid stent. The lesion was then again navigated with synchro soft wire and rapid transit. With the microwire microcatheter positioned in the distal cervical carotid, the microwire was removed. The exchange length Transcend wire was then replaced and the microcatheter was removed. Acute carotid stent was then deployed, 8 mm-6 mm x 40 mm length. Before deployment, we initiated Integrilin drip treatment for dual anti-platelet therapy. Angiogram was performed. Coaxial system of the penumbra aspiration catheter and the pro view 18 catheter were then navigated to the carotid siphon for better angiogram of the cerebral vasculature. Angiogram was performed. Microcatheter, a number aspiration catheter were withdrawn. The balloon guide catheter was positioned in the common carotid artery for final angiogram. All catheter and wires were  removed. The sheath at the right common femoral artery access was withdrawn for an angiogram. Eight French Angio-Seal was then deployed for hemostasis. A flat panel CT was then performed.  Patient was then extubated. Estimated blood loss was 50 cc No complications encountered. IMPRESSION: Status post left-sided cerebral angiogram with treatment of left ICA/MCA tandem occlusion, requiring acute left carotid stenting and mechanical thrombectomy of a left M1 occlusion with achievement of mTICI-3 flow, and restoration of cervical ICA flow. Flat panel CT in the room demonstrates small contrast stain without complicating features, and the patient was extubated. Status post deployment of 8 French Angio-Seal for right common femoral artery hemostasis. Status post ultrasound-guided left common femoral artery access with a 4 French sheath for arterial monitoring, which remained after the case. Status post ultrasound-guided right common femoral vein access for additional IV access, which remained after the case transmitting the Integrilin drip. Signed, Dulcy Fanny. Earleen Newport, DO [facility] Radiology PLAN: Patient will go to the PACU ICU admission Bilateral hips will be straight until the left 4 French arterial monitoring sheath is removed and the right common femoral vein venous access sheath is removed. Routine wound management for the right common femoral artery sheath access. Frequent neurovascular checks. IV saline hydration for 8 hours given the renal insufficiency Blood pressure control management of 120-140 with nicardipine drip ordered The goal will be dual anti-platelet therapy, for now with dose of 650 mg aspirin and the Integrilin drip. Electronically Signed   By: Corrie Mckusick D.O.   On: 05/06/2017 09:24   Dg Chest Port 1 View  Result Date: 05/08/2017 CLINICAL DATA:  Abnormal chest x-ray. EXAM: PORTABLE CHEST 1 VIEW COMPARISON:  Radiograph of May 06, 2017.  FINDINGS: Stable cardiomegaly. Right internal jugular Port-A-Cath is unchanged in position. No pneumothorax is noted. Mild right midlung subsegmental atelectasis is noted. Stable left lung opacities are  noted which may represent atelectasis, scarring infiltration or infiltrative disease from left upper lobe neoplasm. Bony thorax is unremarkable. IMPRESSION: Stable left perihilar and basilar opacities are noted as described above. Mild right midlung subsegmental atelectasis. Electronically Signed   By: Marijo Conception, M.D.   On: 05/08/2017 15:38   Dg Chest Port 1 View  Result Date: 05/06/2017 CLINICAL DATA:  Shortness of breath. Patient status post left MCA thrombectomy and left ICA stent placement today. EXAM: PORTABLE CHEST 1 VIEW COMPARISON:  PA and lateral chest 03/18/2017.  CT chest 03/24/2017. FINDINGS: Interstitial prominence about the patient's left upper lobe pulmonary mass is worse than on the prior plain films of the chest. Mild basilar atelectasis is noted. Heart size is normal. Port-A-Cath is in place. Aortic atherosclerosis is seen. IMPRESSION: Increased interstitial opacities about the patient's left upper lobe pulmonary mass are nonspecific and could be due to tumor spread, postobstructive pneumonitis or less likely hemorrhage. Atherosclerosis. Electronically Signed   By: Inge Rise M.D.   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 The antibiotic was administered within 1 hour of the procedure 75 MCG NITROGLYCERIN INTRA ARTERIAL 650 mg aspirin via orogastric tube. Intra procedural initiation of Integrilin IV drip for dual anti-platelet therapy. ANESTHESIA/SEDATION: General endotracheal tube anesthesia with anesthesia team CONTRAST:  144 cc IV contrast FLUOROSCOPY TIME:  Fluoroscopy Time: 54 minutes 6 seconds (2,926 mGy). COMPLICATIONS: None TECHNIQUE: Informed written consent was obtained from the patient's family after a thorough discussion of the procedural risks, benefits and alternatives. Specific risks discussed include: Bleeding, infection, contrast reaction, kidney injury/failure, need for further procedure/surgery, arterial injury or dissection, embolization to new territory, intracranial hemorrhage (10-15% risk), neurologic deterioration, cardiopulmonary collapse, death. All questions were addressed. Maximal Sterile Barrier Technique was utilized including during the procedure including caps, mask, sterile gowns, sterile gloves, sterile drape, hand hygiene and skin antiseptic. A timeout was performed prior to the initiation of the procedure. FINDINGS: Initial angiogram: Left common carotid artery:  Normal course caliber and contour. Left external carotid artery: Patent with antegrade flow. On the initial injection before treatment, there is minimal opacification of the carotid siphon secondary to meningeal branches which appear to be region ating from ascending pharyngeal distribution and potentially deep meningeal branches from the external carotid artery. No significant filling of the MCA on the initial  injection The initial injection does confirmed perfusion of the lenticulostriate vasculature, as well as a patent anterior coronal artery, with minimal filling of temporal lobe. Anterior basal ganglia structures are perfused. Left internal carotid artery: Internal carotid artery is occluded at the origin with a string sign at the posterior aspect of the artery. Left MCA: Initial injection demonstrates no significant filling of the middle cerebral artery. Once the catheter was  navigated into the carotid siphon for angiogram, and M1 occlusion was confirmed. Left ACA: Anterior cerebral artery perfused by the right-sided flow at the initiation. Final angiogram: After treatment of the carotid occlusion and deployment of 8 mm-6 mm x 40 mm carotid stent, there is excellent flow through the cervical carotid to the intracranial carotid. After mechanical thrombectomy of M1 occlusion, there is modified treatment in cerebral ischemia of 3. Flat panel CT: No complications with small contrast stain of the left basal ganglia. PROCEDURE: Patient was brought to the neurointerventional suite, with the patient identity confirmed, and consent form confirmed. General anesthesia was present for initiation of general endotracheal tube anesthesia. Patient was prepped and draped in the usual sterile fashion. The only venous access was a port catheter with single needle access. Ultrasound survey of the right inguinal region was performed with images stored and sent to PACs. 11 blade scalpel was used to make a small incision. A micropuncture needle was used access the right common femoral artery under ultrasound. With excellent arterial blood flow returned, an .018 micro wire was passed through the needle, observed to enter the abdominal aorta under fluoroscopy. The needle was removed, and a micropuncture sheath was placed over the wire. The inner dilator and wire were removed, and an 035 Bentson wire was advanced under fluoroscopy into the  abdominal aorta. The sheath was removed and a standard 5 [place] vascular sheath was placed. The dilator was removed and the sheath was flushed. After the initial common femoral artery access, ultrasound survey of the left inguinal region was performed in order to place arterial monitoring line. Images stored and sent to PACs. Micropuncture needle was used access the left common femoral artery under ultrasound. With excellent arterial blood flow returned, an .018 micro wire was passed through the needle, observed to enter the abdominal aorta under fluoroscopy. The needle was removed, and a micropuncture sheath was placed over the wire. The inner dilator and wire were removed, and an 035 Bentson wire was advanced under fluoroscopy into the abdominal aorta. The sheath was removed and a standard 4 [place] vascular sheath was placed. The dilator was removed and the sheath was flushed. A 57F JB-1 diagnostic catheter was advanced over the wire through the right common femoral artery access to the proximal descending thoracic aorta. Wire was then removed. Double flush of the catheter was performed. Catheter was then used to select the left common carotid artery. Angiogram was performed. Using roadmap technique, the catheter was advanced over a roadrunner wire into right common carotid artery. Formal angiogram was performed. Catheter was advanced over the roadrunner wire into the external carotid artery. Wire was removed. Exchange length Rosen wire was then passed through the diagnostic catheter to the distal common carotid artery and the diagnostic catheter was removed. The 5 French sheath was removed and exchanged for 8 French 55 centimeter BrightTip sheath. Sheath was flushed and attached to pressurized and heparinized saline bag for constant forward flow. Then an 8 [place], 95 cm Flowgate balloon tip catheter was prepared on the back table with inflation of the balloon with 50/50 concentration of dilute contrast. The  balloon catheter was then advanced over the wire, positioned into the distal common carotid artery. Copious back flush was performed and the balloon catheter was attached to heparinized and pressurized saline bag for forward flow. Roadmap was then performed at the carotid bifurcation. Combination of a rapid transit microwire and a synchro soft wire were used to navigate beyond the occlusion at the  proximal internal carotid artery. The microcatheter was then navigated into the distal cervical segment, wire was removed, blood was aspirated, and a gentle injection confirmed a luminal position. Exchange length Transcend wire was then placed through the microcatheter into the cervical carotid. Microcatheter was removed. Balloon angioplasty was then performed with a rapid exchange system, using a Viatrac balloon 5 mm x 30 mm. The patient required treatment at this time with atropine dose, as we experienced bradycardia into the 30s. His heart rate recovered after treatment by the anesthesia team. Balloon was removed. Angiogram confirmed flow into the cervical carotid. A coaxial system of an Ace 64 penumbra aspiration catheter and the Trevo pro view 18 catheter was then advanced over the exchange length wire into the cervical carotid. Once the coaxial system was in the cervical carotid, the microwire was removed and exchanged for synchro soft. The synchro soft wire was navigated beyond the occlusion under roadmap guidance, and the aspiration catheter was advanced into the ICA terminus. The balloon guide catheter was then advanced into the mid segment of the cervical carotid artery. The microcatheter and microwire were then carefully advanced through the occluded segment. Microcatheter was then push through the occluded segment and the wire was removed. Blood was then aspirated through the hub of the microcatheter, and a gentle contrast injection was performed confirming intraluminal position. A rotating hemostatic valve was  then attached to the back end of the microcatheter, and a pressurized and heparinized saline bag was attached to the catheter. 4 x 40 solitaire device was then selected. Back flush was achieved at the rotating hemostatic valve, and then the device was gently advanced through the microcatheter to the distal end. The retriever was then unsheathed by withdrawing the microcatheter under fluoroscopy. A 5 minutes interval was observed. At this time, ultrasound-guided access of the right common femoral vein was performed for placement of a additional venous access and placement of a 12 French triple-lumen IV catheter. We then proceeded with the mechanical thrombectomy. The balloon at the balloon tip catheter was then inflated under fluoroscopy for proximal flow arrest. Constant aspiration was then performed at the penumbra aspiration catheter at the carotid terminus as the retriever was gently and slowly withdrawn with fluoroscopic observation. Once the retriever was entirely removed from the system, free aspiration was confirmed at the hub of the aspiration catheter, with free blood return confirmed. The balloon was then deflated, rotating hemostatic valve was reattached, and a control angiogram was performed, confirming restoration of flow. Penumbra catheter was then withdrawn, and the angiogram was performed at the carotid bifurcation. Given the appearance, we elected to place acute carotid stent. The lesion was then again navigated with synchro soft wire and rapid transit. With the microwire microcatheter positioned in the distal cervical carotid, the microwire was removed. The exchange length Transcend wire was then replaced and the microcatheter was removed. Acute carotid stent was then deployed, 8 mm-6 mm x 40 mm length. Before deployment, we initiated Integrilin drip treatment for dual anti-platelet therapy. Angiogram was performed. Coaxial system of the penumbra aspiration catheter and the pro view 18 catheter  were then navigated to the carotid siphon for better angiogram of the cerebral vasculature. Angiogram was performed. Microcatheter, a number aspiration catheter were withdrawn. The balloon guide catheter was positioned in the common carotid artery for final angiogram. All catheter and wires were removed. The sheath at the right common femoral artery access was withdrawn for an angiogram. Eight French Angio-Seal was then deployed for hemostasis. A  flat panel CT was then performed.  Patient was then extubated. Estimated blood loss was 50 cc No complications encountered. IMPRESSION: Status post left-sided cerebral angiogram with treatment of left ICA/MCA tandem occlusion, requiring acute left carotid stenting and mechanical thrombectomy of a left M1 occlusion with achievement of mTICI-3 flow, and restoration of cervical ICA flow. Flat panel CT in the room demonstrates small contrast stain without complicating features, and the patient was extubated. Status post deployment of 8 French Angio-Seal for right common femoral artery hemostasis. Status post ultrasound-guided left common femoral artery access with a 4 French sheath for arterial monitoring, which remained after the case. Status post ultrasound-guided right common femoral vein access for additional IV access, which remained after the case transmitting the Integrilin drip. Signed, Dulcy Fanny. Earleen Newport, DO [facility] Radiology PLAN: Patient will go to the PACU ICU admission Bilateral hips will be straight until the left 4 French arterial monitoring sheath is removed and the right common femoral vein venous access sheath is removed. Routine wound management for the right common femoral artery sheath access. Frequent neurovascular checks. IV saline hydration for 8 hours given the renal insufficiency Blood pressure control management of 120-140 with nicardipine drip ordered The goal will be dual anti-platelet therapy, for  now with dose of 650 mg aspirin and the Integrilin drip. Electronically Signed   By: Corrie Mckusick D.O.   On: 05/06/2017 09:24   Ct Head Code Stroke Wo Contrast  Result Date: 05/06/2017 CLINICAL DATA:  Code stroke. 77 y/o M; right-sided weakness and numbness with slurred speech. EXAM: CT HEAD WITHOUT CONTRAST TECHNIQUE: Contiguous axial images were obtained from the base of the skull through the vertex without intravenous contrast. COMPARISON:  11/19/2016 MRI of the head. FINDINGS: Brain: No evidence of acute infarction, hemorrhage, hydrocephalus, extra-axial collection or mass lesion/mass effect. Stable moderate chronic microvascular ischemic changes and parenchymal volume loss of the brain. Vascular: Calcific atherosclerosis of carotid siphons. Dense left M1 (series 5, image 30). Skull: Normal. Negative for fracture or focal lesion. Sinuses/Orbits: No acute finding. Other: None. ASPECTS [facility] Stroke Program Early CT Score) - Ganglionic level infarction (caudate, lentiform nuclei, internal capsule, insula, M1-M3 cortex): 7 - Supraganglionic infarction (M4-M6 cortex): 3 Total score (0-10 with 10 being normal): 10 IMPRESSION: 1. No acute stroke, hemorrhage, or mass effect identified. 2. Dense left M1, possible thrombus. 3. ASPECTS is 10 4. Stable chronic microvascular ischemic changes and parenchymal volume loss of the brain given differences in technique. These results were communicated to Dr. Cheral Marker at 3:41 amon 2/28/2019by text page via the [facility] messaging system. Electronically Signed   By: Kristine Garbe M.D.   On: 05/06/2017 03:43    Jones Data:  CBC: Recent Labs  Jones 05/06/17 0312 05/06/17 0329 05/07/17 0500 05/08/17 0615 05/08/17 1444  WBC 8.0  --  8.3 6.9  --   NEUTROABS 6.3  --   --   --   --   HGB 12.6* 13.3 10.7* 11.2* 11.8*  HCT 38.4* 39.0 33.4* 34.9* 36.5*  MCV 95.5  --  96.3 95.9  --   PLT 185  --  176 164  --    Basic Metabolic Panel: Recent Labs  Jones  05/06/17 0312 05/06/17 0329 05/07/17 0500 05/08/17 0615  NA 135 137 139 139  K 4.3 4.3 4.3 4.4  CL 101 103 109 110  CO2 22  --  22 20*  GLUCOSE 383* 366* 203* 173*  BUN 40* 37* 30* 33*  CREATININE 1.98* 1.80* 1.88* 1.89*  CALCIUM 9.1  --  8.2* 8.4*   GFR: Estimated Creatinine Clearance: 39.1 mL/min (A) (by C-G formula based on SCr of 1.89 mg/dL (H)). Liver Function Tests: Recent Labs  Jones 05/06/17 0312  AST 16  ALT 13*  ALKPHOS 100  BILITOT 0.4  PROT 6.6  ALBUMIN 3.1*   No results for input(s): LIPASE, AMYLASE in the last 168 hours. No results for input(s): AMMONIA in the last 168 hours. Coagulation Profile: Recent Labs  Jones 05/06/17 0312  INR 0.92   Cardiac Enzymes: No results for input(s): CKTOTAL, CKMB, CKMBINDEX, TROPONINI in the last 168 hours. BNP (last 3 results) No results for input(s): PROBNP in the last 8760 hours. HbA1C: Recent Labs    05/07/17 0500  HGBA1C 9.1*   CBG: Recent Labs  Jones 05/08/17 0751 05/08/17 1158 05/08/17 1648 05/08/17 2029 05/09/17 0851  GLUCAP 168* 185* 154* 153* 177*   Lipid Profile: Recent Labs    05/07/17 0500  CHOL 173  HDL 61  LDLCALC 69  TRIG 215*  CHOLHDL 2.8   Thyroid Function Tests: No results for input(s): TSH, T4TOTAL, FREET4, T3FREE, THYROIDAB in the last 72 hours. Anemia Panel: No results for input(s): VITAMINB12, FOLATE, FERRITIN, TIBC, IRON, RETICCTPCT in the last 72 hours. Urine analysis:    Component Value Date/Time   COLORURINE YELLOW 05/08/2017 [place] 05/08/2017 1634   LABSPEC 1.017 05/08/2017 1634   PHURINE 5.0 05/08/2017 1634   GLUCOSEU NEGATIVE 05/08/2017 1634   HGBUR LARGE (A) 05/08/2017 1634   BILIRUBINUR NEGATIVE 05/08/2017 1634   KETONESUR NEGATIVE 05/08/2017 1634   PROTEINUR 30 (A) 05/08/2017 1634   NITRITE NEGATIVE 05/08/2017 1634   LEUKOCYTESUR NEGATIVE 05/08/2017 1634     Shanikia Kernodle M.D. [facility] 05/09/2017, 9:56 AM  Pager: [phone] Between  7am to 7pm - call Pager - [phone]  After 7pm go to [email or web address] - password [email or web address]  Call night coverage person covering after 7pm

## 2017-05-09 NOTE — Consult Note (Signed)
Physical Medicine and Rehabilitation Consult Reason for Consult:Stroke rehabilitation Referring Phsyician: Edwin Jones is an 77 y.o. male.   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  He did not receive TPA secondary to concerns of potential brain metastasis.  Patient was evaluated by neurology, MRI demonstrating left M1 occlusion and patient underwent mechanical thrombectomy and ICA stenting.  Conversations between oncology and neurology suggested that the stroke was not related to hypercoagulable state but likely cardioembolic source and therefore TEE was ordered after transthoracic echocardiogram did not demonstrate embolic source.  Patient was on aspirin 81 mg prior to admission, placed on Integrilin but this was switched to Plavix.  Patient had problems with hypotension after ICA stenting was placed on Neo-Synephrine drip and then switched to oral midodrine.  Patient is a diabetic and he has been started on Tradjenta as well as Glucotrol. Physical therapy evaluation 05/07/2017, mod assist with transfers mod assist 15 feet with rolling walker. Speech therapy evaluation 05/06/2017, regular diet with thin liquids recommended small sips with upright seating.  Language evaluation by speech therapy on 05/06/2017 demonstrated moderate expressive aphasia with poor ability to self-correct Review of Systems - Review of Systems  Constitutional: Negative for chills and fever.  HENT: Negative for nosebleeds.   Eyes: Negative for blurred vision, discharge and redness.  Respiratory: Negative for cough, shortness of breath, wheezing and stridor.   Cardiovascular: Negative for chest pain, palpitations and leg swelling.  Gastrointestinal: Positive for constipation. Negative for abdominal pain, nausea and vomiting.  Genitourinary: Negative for frequency and urgency.   Musculoskeletal: Negative for back pain, joint pain and neck pain.  Skin: Negative for itching and rash.  Neurological: Positive for speech change, focal weakness and weakness. Negative for headaches.  Endo/Heme/Allergies: Does not bruise/bleed easily.  Psychiatric/Behavioral: The patient is not nervous/anxious.     Past Medical History:  Diagnosis Date  . Cancer ([place])   . COPD (chronic obstructive pulmonary disease) ([place])   . Diabetes mellitus without complication ([place])   . GERD without esophagitis   . Hepatitis C virus    history of hep C treated with Harvoni  . Hypercholesterolemia   . Hypertension   . Seasonal allergies    Past Surgical History:  Procedure Laterality Date  . COLONOSCOPY     approximately 11 years ago  . FLEXIBLE BRONCHOSCOPY N/A 11/05/2016   Procedure: FLEXIBLE BRONCHOSCOPY;  Surgeon: Wilhelmina Mcardle, MD;  Location: [facility] ORS;  Service: Pulmonary;  Laterality: N/A;  . IR FLUORO GUIDE CV LINE RIGHT  05/06/2017  . IR INTRAVSC STENT CERV CAROTID W/O EMB-PROT MOD SED INC ANGIO  05/06/2017  . IR PERCUTANEOUS ART THROMBECTOMY/INFUSION INTRACRANIAL INC DIAG ANGIO  05/06/2017  . IR US GUIDE VASC ACCESS LEFT  05/06/2017  . IR US GUIDE VASC ACCESS RIGHT  05/06/2017  . IR US GUIDE VASC ACCESS RIGHT  05/06/2017  . PORTA CATH INSERTION N/A 11/23/2016   Procedure: Glori Luis Cath Insertion;  Surgeon: Algernon Huxley, MD;  Location: [place] CV LAB;  Service: Cardiovascular;  Laterality: N/A;  . RADIOLOGY WITH ANESTHESIA N/A 05/06/2017   Procedure: IR WITH ANESTHESIA;  Surgeon: Radiologist, Medication, MD;  Location: [place];  Service: Radiology;  Laterality: N/A;  . TONSILLECTOMY     Family History  Problem Relation Age of Onset  . Skin cancer Mother   . Ovarian cancer Sister  sister was diagnosed as a teenager  . Throat cancer Brother 75       brother #1  . Lung cancer Sister 74  . Throat cancer Brother 22       brother #2  . Skin cancer Sister    Social History:   reports that he quit smoking about 13 years ago. His smoking use included cigarettes. He has a 50.00 pack-year smoking history. he has never used smokeless tobacco. He reports that he does not drink alcohol or use drugs. Allergies:  Allergies  Allergen Reactions  . Penicillin G Anaphylaxis    Other reaction(s): Other (See Comments) Couldn't breath among other things Has patient had a PCN reaction causing immediate rash, facial/tongue/throat swelling, SOB or lightheadedness with hypotension: Yes Has patient had a PCN reaction causing severe rash involving mucus membranes or skin necrosis: No Has patient had a PCN reaction that required hospitalization: Yes Has patient had a PCN reaction occurring within the last 10 years: No If all of the above answers are "NO", then may proceed with Ce  . Shellfish-Derived Products Anaphylaxis  . Statins     Other reaction(s): Other (See Comments) Muscle spasms - can't walk - couldn't turn over in bed  . Erythromycin Itching    All mycins  . Tamsulosin     Other reaction(s): Dizziness  . Tuberculin Ppd     Other reaction(s): Other (See Comments) False test   . Iodinated Diagnostic Agents Hives   Medications Prior to Admission  Medication Sig Dispense Refill  . albuterol (PROVENTIL HFA;VENTOLIN HFA) 108 (90 Base) MCG/ACT inhaler Inhale 2 puffs into the lungs every 6 (six) hours as needed for wheezing or shortness of breath. 1 Inhaler 2  . APPLE CIDER VINEGAR PO Take 450 mg by mouth at bedtime.     Marland Kitchen aspirin EC 81 MG tablet Take 81 mg by mouth at bedtime.     . cetirizine (ZYRTEC) 10 MG tablet Take 10 mg by mouth daily.    . cholecalciferol (VITAMIN D) 1000 units tablet Take 1,000 Units by mouth daily.    . Coenzyme Q10 100 MG capsule Take 100 mg by mouth daily.     . Flaxseed, Linseed, (FLAXSEED OIL) 1000 MG CAPS Take 1,000 mg by mouth at bedtime.     . fluticasone (FLONASE) 50 MCG/ACT nasal spray Place 1 spray into both nostrils daily as needed for  allergies or rhinitis.    . Fluticasone-Salmeterol (ADVAIR DISKUS) 500-50 MCG/DOSE AEPB Inhale 1 puff into the lungs 2 (two) times daily. 1 each 3  . glipiZIDE (GLUCOTROL) 10 MG tablet Take 10 mg by mouth daily. Time release capsule    . glucagon (GLUCAGON EMERGENCY) 1 MG injection Inject 1 mg into the muscle once as needed. When blood glucose drops less than 50; and if patient has difficulty drinking. 1 each 12  . GNP GARLIC EXTRACT PO Take 4,166 mg by mouth daily.     . hydrochlorothiazide (HYDRODIURIL) 25 MG tablet Take 25 mg by mouth daily.    Marland Kitchen linagliptin (TRADJENTA) 5 MG TABS tablet Take 5 mg by mouth daily.    Marland Kitchen lisinopril (PRINIVIL,ZESTRIL) 10 MG tablet Take 10 mg by mouth daily.    . methocarbamol (ROBAXIN) 500 MG tablet Take 1 tablet (500 mg total) by mouth every 8 (eight) hours as needed for muscle spasms. 60 tablet 0  . Multiple Vitamin (MULTIVITAMIN WITH MINERALS) TABS tablet Take 1 tablet by mouth daily.    Marland Kitchen omeprazole (PRILOSEC) 20  MG capsule Take 20 mg by mouth every Monday, Wednesday, and Friday.     . simethicone (MYLICON) 80 MG chewable tablet Chew 80 mg by mouth as needed for flatulence.     . vitamin C (ASCORBIC ACID) 250 MG tablet Take 250 mg by mouth daily.    Marland Kitchen zinc sulfate 220 (50 Zn) MG capsule Take 220 mg by mouth daily.    Marland Kitchen dexamethasone (DECADRON) 4 MG tablet Take 1 tablet (4 mg total) by mouth daily. Starting on the first day of radiation (Patient not taking: Reported on 05/06/2017) 25 tablet 0  . lidocaine-prilocaine (EMLA) cream Apply 1 application topically as needed. (Patient not taking: Reported on 05/06/2017) 30 g 3  . ondansetron (ZOFRAN) 8 MG tablet Take 1 tablet (8 mg total) by mouth every 8 (eight) hours as needed for nausea or vomiting (start 3 days; after chemo). (Patient not taking: Reported on 03/25/2017) 40 tablet 1  . prochlorperazine (COMPAZINE) 10 MG tablet Take 1 tablet (10 mg total) by mouth every 6 (six) hours as needed for nausea or vomiting.  (Patient not taking: Reported on 03/25/2017) 40 tablet 1  . ZINC-VITAMIN C MT Take 1 tablet daily by mouth.      Home: Home Living Family/patient expects to be discharged to:: Private residence Living Arrangements: Spouse/significant other Available Help at Discharge: Family, Available 24 hours/day Type of Home: House Home Access: Stairs to enter [employer] of Steps: 2 Entrance Stairs-Rails: Right, Left Home Layout: One level Bathroom Shower/Tub: Multimedia programmer: Standard Home Equipment: None  Functional History: Prior Function Comments: does all his ADL's and drives Functional Status:  Mobility:     Ambulation/Gait Ambulation Distance (Feet): 15 Feet General Gait Details: paretic gait with small tentative steps and need for stability and maneuvering of the RW    ADL:    Cognition: Cognition Overall Cognitive Status: Difficult to assess Arousal/Alertness: Awake/alert Orientation Level: Oriented to person, Oriented to place, Oriented to situation Attention: Sustained Sustained Attention: Appears intact Cognition Arousal/Alertness: Awake/alert Behavior During Therapy: WFL for tasks assessed/performed Overall Cognitive Status: Difficult to assess Difficult to assess due to: Impaired communication  Blood pressure 140/74, pulse 69, temperature 98.4 F (36.9 C), resp. rate 17, height _0  (1.905 m), weight 96.2 kg (212 lb 1.3 oz), SpO2 99 %. Physical Exam  General: No acute distress Mood and affect are appropriate Heart: Regular rate and rhythm no rubs murmurs or extra sounds Lungs: Clear to auscultation, breathing unlabored, no rales or wheezes Abdomen: Positive bowel sounds, soft nontender to palpation, nondistended Extremities: No clubbing, cyanosis, or edema Skin: No evidence of breakdown, no evidence of rash, RIght upper chest Port A Cath CDI Neurologic: Cranial nerves II through XII intact, motor strength is 5/5 in left deltoid,  bicep, tricep, grip, hip flexor, knee extensors, ankle dorsiflexor and plantar flexor 3-/5 right deltoid bicep tricep grip, 4- in the right hip flexor knee extensor ankle dorsiflexor Sensory exam normal sensation to light touch and proprioception in bilateral upper and lower extremities Dual fields are intact to confrontation testing Musculoskeletal: Full range of motion in all 4 extremities. No joint swelling Results for orders placed or performed during the hospital encounter of 05/06/17 (from the past 24 hour(s))  Glucose, capillary     Status: Abnormal   Collection Time: 05/08/17 11:58 AM  Result Value Ref Range   Glucose-Capillary 185 (H) 65 - 99 mg/dL  Cortisol     Status: None   Collection Time: 05/08/17  2:06 PM  Result Value Ref Range   Cortisol, Plasma 15.6 ug/dL  Hemoglobin and hematocrit, blood     Status: Abnormal   Collection Time: 05/08/17  2:44 PM  Result Value Ref Range   Hemoglobin 11.8 (L) 13.0 - 17.0 g/dL   HCT 36.5 (L) 39.0 - 52.0 %  Lactic acid, plasma     Status: Abnormal   Collection Time: 05/08/17  2:44 PM  Result Value Ref Range   Lactic Acid, Venous 2.5 (HH) 0.5 - 1.9 mmol/L  Urinalysis, Routine w reflex microscopic     Status: Abnormal   Collection Time: 05/08/17  4:34 PM  Result Value Ref Range   Color, Urine YELLOW YELLOW   APPearance CLEAR CLEAR   Specific Gravity, Urine 1.017 1.005 - 1.030   pH 5.0 5.0 - 8.0   Glucose, UA NEGATIVE NEGATIVE mg/dL   Hgb urine dipstick LARGE (A) NEGATIVE   Bilirubin Urine NEGATIVE NEGATIVE   Ketones, ur NEGATIVE NEGATIVE mg/dL   Protein, ur 30 (A) NEGATIVE mg/dL   Nitrite NEGATIVE NEGATIVE   Leukocytes, UA NEGATIVE NEGATIVE   RBC / HPF TOO NUMEROUS TO COUNT 0 - 5 RBC/hpf   WBC, UA 0-5 0 - 5 WBC/hpf   Bacteria, UA RARE (A) NONE SEEN   Squamous Epithelial / LPF 0-5 (A) NONE SEEN   Mucus PRESENT   Glucose, capillary     Status: Abnormal   Collection Time: 05/08/17  4:48 PM  Result Value Ref Range    Glucose-Capillary 154 (H) 65 - 99 mg/dL  Lactic acid, plasma     Status: None   Collection Time: 05/08/17  6:36 PM  Result Value Ref Range   Lactic Acid, Venous 1.6 0.5 - 1.9 mmol/L  Glucose, capillary     Status: Abnormal   Collection Time: 05/08/17  8:29 PM  Result Value Ref Range   Glucose-Capillary 153 (H) 65 - 99 mg/dL  Lactic acid, plasma     Status: None   Collection Time: 05/08/17  8:30 PM  Result Value Ref Range   Lactic Acid, Venous 1.9 0.5 - 1.9 mmol/L  Glucose, capillary     Status: Abnormal   Collection Time: 05/09/17  8:51 AM  Result Value Ref Range   Glucose-Capillary 177 (H) 65 - 99 mg/dL   Dg Chest Port 1 View  Result Date: 05/08/2017 CLINICAL DATA:  Abnormal chest x-ray. EXAM: PORTABLE CHEST 1 VIEW COMPARISON:  Radiograph of May 06, 2017. FINDINGS: Stable cardiomegaly. Right internal jugular Port-A-Cath is unchanged in position. No pneumothorax is noted. Mild right midlung subsegmental atelectasis is noted. 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 Bony thorax is unremarkable. IMPRESSION: Stable left perihilar and basilar opacities are noted as described above. Mild right midlung subsegmental atelectasis. Electronically Signed   By: Marijo Conception, M.D.   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 Does the need for close, 24 hr/day medical supervision in concert with the patient's rehab needs make it unreasonable for this patient to be served in a less intensive setting? Yes 2. Co-Morbidities requiring supervision/potential complications: Stage III small cell lung CA, diabetes, COPD completed chemo with ongoing radiation therapy 3. Due to bladder management, bowel management, safety,  skin/wound care, disease management, medication administration, pain management and patient education, does the patient require 24 hr/day  rehab nursing? Yes 4. Does the patient require coordinated care of a physician, rehab nurse, PT (1-2 hrs/day, 5 days/week), OT (1-2 hrs/day, 5 days/week) and SLP (.5-1 hrs/day, 5 days/week) to address physical and functional deficits in the context of the above medical diagnosis(es)? Yes Addressing deficits in the following areas: balance, endurance, locomotion, strength, transferring, bowel/bladder control, bathing, dressing, feeding, grooming, toileting, cognition, speech, language, swallowing and psychosocial support 5. Can the patient actively participate in an intensive therapy program of at least 3 hrs of therapy per day at least 5 days per week? Yes 6. The potential for patient to make measurable gains while on inpatient rehab is excellent 7. Anticipated functional outcomes upon discharge from inpatients are Sup PT, Sup OT, ModISLP 8. Estimated rehab length of stay to reach the above functional goals is: 12-16d 9. Does the patient have adequate social supports to accommodate these discharge functional goals? Yes and Potentially 10. Anticipated D/C setting: Home 11. Anticipated post D/C treatments: [facility] therapy 12. Overall Rehab/Functional Prognosis: good  RECOMMENDATIONS: This patient's condition is appropriate for continued rehabilitative care in the following setting: CIR Patient has agreed to participate in recommended program. Yes Note that insurance prior authorization may be required for reimbursement for recommended care.  Comment: Scheduled for for TEE in a.m.   Charlett Blake 05/09/2017

## 2017-05-09 NOTE — Progress Notes (Signed)
Report called to Jaclyn Prime, Therapist, sports. Pt transported on telemetry to 3W35 by wheelchair with wife. VSS

## 2017-05-10 ENCOUNTER — Encounter (HOSPITAL_COMMUNITY): Payer: Self-pay | Admitting: Cardiovascular Disease

## 2017-05-10 ENCOUNTER — Inpatient Hospital Stay (HOSPITAL_COMMUNITY): Payer: Medicare Other

## 2017-05-10 ENCOUNTER — Encounter (HOSPITAL_COMMUNITY)
Admission: EM | Disposition: A | Discharge status: Rehabilitation Facility | Payer: Self-pay | Source: Home / Self Care | Attending: Neurology

## 2017-05-10 DIAGNOSIS — I6389 Other cerebral infarction: Secondary | ICD-10-CM

## 2017-05-10 DIAGNOSIS — K219 Gastro-esophageal reflux disease without esophagitis: Secondary | ICD-10-CM

## 2017-05-10 HISTORY — PX: TEE WITHOUT CARDIOVERSION: SHX5443

## 2017-05-10 LAB — CBC
HEMATOCRIT: 32.1 % — AB (ref 39.0–52.0)
HEMOGLOBIN: 10.3 g/dL — AB (ref 13.0–17.0)
MCH: 30.7 pg (ref 26.0–34.0)
MCHC: 32.1 g/dL (ref 30.0–36.0)
MCV: 95.8 fL (ref 78.0–100.0)
Platelets: 181 10*3/uL (ref 150–400)
RBC: 3.35 MIL/uL — AB (ref 4.22–5.81)
RDW: 13.3 % (ref 11.5–15.5)
WBC: 4.9 10*3/uL (ref 4.0–10.5)

## 2017-05-10 LAB — GLUCOSE, CAPILLARY
GLUCOSE-CAPILLARY: 204 mg/dL — AB (ref 65–99)
Glucose-Capillary: 139 mg/dL — ABNORMAL HIGH (ref 65–99)
Glucose-Capillary: 163 mg/dL — ABNORMAL HIGH (ref 65–99)
Glucose-Capillary: 120 mg/dL — ABNORMAL HIGH (ref 65–99)

## 2017-05-10 LAB — BASIC METABOLIC PANEL
Anion gap: 11 (ref 5–15)
BUN: 28 mg/dL — AB (ref 6–20)
CHLORIDE: 110 mmol/L (ref 101–111)
CO2: 18 mmol/L — ABNORMAL LOW (ref 22–32)
Calcium: 8.1 mg/dL — ABNORMAL LOW (ref 8.9–10.3)
Creatinine, Ser: 1.7 mg/dL — ABNORMAL HIGH (ref 0.61–1.24)
GFR calc Af Amer: 43 mL/min — ABNORMAL LOW (ref >60)
GFR, EST NON AFRICAN AMERICAN: 37 mL/min — AB (ref >60)
GLUCOSE: 141 mg/dL — AB (ref 65–99)
POTASSIUM: 4.1 mmol/L (ref 3.5–5.1)
Sodium: 139 mmol/L (ref 135–145)

## 2017-05-10 SURGERY — ECHOCARDIOGRAM, TRANSESOPHAGEAL
Anesthesia: Moderate Sedation

## 2017-05-10 MED ORDER — NYSTATIN 100000 UNIT/ML MT SUSP
5.0000 mL | Freq: Four times a day (QID) | OROMUCOSAL | Status: DC
  Administered 2017-05-10 – 2017-05-11 (×4): 500000 [IU] via OROMUCOSAL
  Filled 2017-05-10 (×6): qty 5

## 2017-05-10 MED ORDER — FENTANYL CITRATE (PF) 100 MCG/2ML IJ SOLN
INTRAMUSCULAR | Status: DC | PRN
  Administered 2017-05-10 (×2): 25 ug via INTRAVENOUS

## 2017-05-10 MED ORDER — SODIUM CHLORIDE BACTERIOSTATIC 0.9 % IJ SOLN
INTRAMUSCULAR | Status: DC | PRN
  Administered 2017-05-10: 9 mL

## 2017-05-10 MED ORDER — FENTANYL CITRATE (PF) 100 MCG/2ML IJ SOLN
INTRAMUSCULAR | Status: AC
  Filled 2017-05-10: qty 2

## 2017-05-10 MED ORDER — MIDAZOLAM HCL 5 MG/ML IJ SOLN
INTRAMUSCULAR | Status: AC
  Filled 2017-05-10: qty 2

## 2017-05-10 MED ORDER — BUTAMBEN-TETRACAINE-BENZOCAINE 2-2-14 % EX AERO
INHALATION_SPRAY | CUTANEOUS | Status: DC | PRN
  Administered 2017-05-10: 2 via TOPICAL

## 2017-05-10 MED ORDER — MIDAZOLAM HCL 10 MG/2ML IJ SOLN
INTRAMUSCULAR | Status: DC | PRN
  Administered 2017-05-10: 1 mg via INTRAVENOUS
  Administered 2017-05-10: 2 mg via INTRAVENOUS

## 2017-05-10 NOTE — Progress Notes (Addendum)
Inpatient Rehabilitation  Briefly met with patient at bedside to discuss team's recommendation for IP Rehab.  Shared booklets, rehab overview, and insurance verification letter.  Patient reported that his spouse had gone home for the day, but would be back tomorrow.  Called and spoke with spouse about team's recommendations for IP Rehab.  Plan to follow up with them tomorrow at 10am for a hopeful IP Rehab admission.  Call if questions.    Carmelia Roller., CCC/SLP Admission Coordinator  [place]  Cell [phone]

## 2017-05-10 NOTE — Progress Notes (Signed)
  Echocardiogram Echocardiogram Transesophageal has been performed.  Edwin Jones 05/10/2017, 2:28 PM

## 2017-05-10 NOTE — Care Management Important Message (Signed)
Important Message  Patient Details  Name: Edwin Jones MRN: [id number] Date of Birth: 11-Jan-1941   Medicare Important Message Given:  Yes    [name] 05/10/2017, 12:06 PM

## 2017-05-10 NOTE — Progress Notes (Signed)
Inpatient Rehabilitation  I have attempted to meet with patient x2 today to discuss team's recommendation for IP Rehab.  Will plan to follow up likely tomorrow.  Call if questions.    Carmelia Roller., CCC/SLP Admission Coordinator  [place]  Cell [phone]

## 2017-05-10 NOTE — Op Note (Signed)
INDICATIONS: cryptogenic stroke  PROCEDURE:   Informed consent was obtained prior to the procedure. The risks, benefits and alternatives for the procedure were discussed and the patient comprehended these risks.  Risks include, but are not limited to, cough, sore throat, vomiting, nausea, somnolence, esophageal and stomach trauma or perforation, bleeding, low blood pressure, aspiration, pneumonia, infection, trauma to the teeth and death.    After a procedural time-out, the oropharynx was anesthetized with 20% benzocaine spray.   During this procedure the patient was administered a total of Versed 3 mg and Fentanyl 25 mg to achieve and maintain moderate conscious sedation.  The patient's heart rate, blood pressure, and oxygen saturationweare monitored continuously during the procedure. The period of conscious sedation was 15 minutes, of which I was present face-to-face 100% of this time.  The transesophageal probe was inserted in the esophagus and stomach without difficulty and multiple views were obtained.  The patient was kept under observation until the patient left the procedure room.  The patient left the procedure room in stable condition.   Agitated microbubble saline contrast was administered.  COMPLICATIONS:    There were no immediate complications.  FINDINGS:  Dilated left atrium, normal appendage velocities, no thrombus. No PFO/ASD - very late saline bubbles are seen in the left atrium, consistent with pulmonic re-circulation. Almost no plaque in the aorta. Normal TEE, no cardiac or aortic embolic source.  RECOMMENDATIONS:     Arrhythmia monitor.  Time Spent Directly with the Patient:  30 minutes   Derl Abalos 05/10/2017, 2:21 PM

## 2017-05-10 NOTE — Progress Notes (Addendum)
STROKE TEAM PROGRESS NOTE   SUBJECTIVE (INTERVAL HISTORY) Patient's wife is at the bedside. Dr. Leonie Man discussed with her plans for TEE and purpose. She verbalized understanding. Nurse reports oral thrush, requests Nystatin.   OBJECTIVE Vitals:   05/10/17 0425 05/10/17 0744 05/10/17 0748 05/10/17 1200  BP: 138/84  (!) 148/67 (!) 155/72  Pulse: 61  65 (!) 58  Resp: 20  18 18   Temp: 98.9 F (37.2 C)  98.5 F (36.9 C) 97.7 F (36.5 C)  TempSrc: Oral  Oral Oral  SpO2: 98% 93% 97% 97%  Weight:      Height:         Recent Labs  Lab 05/09/17 1111 05/09/17 1718 05/09/17 2118 05/10/17 0624 05/10/17 1132  GLUCAP 195* 120* 102* 139* 163*   Recent Labs  Lab 05/06/17 0312 05/06/17 0329 05/07/17 0500 05/08/17 0615 05/09/17 1406 05/10/17 0425  NA 135 137 139 139 137 139  K 4.3 4.3 4.3 4.4 4.0 4.1  CL 101 103 109 110 108 110  CO2 22  --  22 20* 18* 18*  GLUCOSE 383* 366* 203* 173* 236* 141*  BUN 40* 37* 30* 33* 32* 28*  CREATININE 1.98* 1.80* 1.88* 1.89* 1.81* 1.70*  CALCIUM 9.1  --  8.2* 8.4* 8.1* 8.1*   Recent Labs  Lab 05/06/17 0312  AST 16  ALT 13*  ALKPHOS 100  BILITOT 0.4  PROT 6.6  ALBUMIN 3.1*   Recent Labs  Lab 05/06/17 0312  05/07/17 0500 05/08/17 0615 05/08/17 1444 05/09/17 1406 05/10/17 0425  WBC 8.0  --  8.3 6.9  --  5.7 4.9  NEUTROABS 6.3  --   --   --   --   --   --   HGB 12.6*   < > 10.7* 11.2* 11.8* 10.5* 10.3*  HCT 38.4*   < > 33.4* 34.9* 36.5* 32.1* 32.1*  MCV 95.5  --  96.3 95.9  --  95.0 95.8  PLT 185  --  176 164  --  184 181   < > = values in this interval not displayed.   No results for input(s): CKTOTAL, CKMB, CKMBINDEX, TROPONINI in the last 168 hours. No results for input(s): LABPROT, INR in the last 72 hours. Recent Labs    05/08/17 1634  COLORURINE YELLOW  LABSPEC 1.017  PHURINE 5.0  GLUCOSEU NEGATIVE  HGBUR LARGE*  BILIRUBINUR NEGATIVE  KETONESUR NEGATIVE  PROTEINUR 30*  NITRITE NEGATIVE  LEUKOCYTESUR NEGATIVE      Component Value Date/Time   CHOL 173 05/07/2017 0500   TRIG 215 (H) 05/07/2017 0500   HDL 61 05/07/2017 0500   CHOLHDL 2.8 05/07/2017 0500   VLDL 43 (H) 05/07/2017 0500   LDLCALC 69 05/07/2017 0500   Lab Results  Component Value Date   HGBA1C 9.1 (H) 05/07/2017      Component Value Date/Time   LABOPIA NONE DETECTED 05/06/2017 1011   COCAINSCRNUR NONE DETECTED 05/06/2017 1011   LABBENZ NONE DETECTED 05/06/2017 1011   AMPHETMU NONE DETECTED 05/06/2017 1011   THCU NONE DETECTED 05/06/2017 1011   LABBARB NONE DETECTED 05/06/2017 1011    TTE : Left ventricle: The cavity size was normal. There was mild concentric hypertrophy. Systolic function was normal. The estimated ejection fraction was in the range of 60% to 65%. Wall motion was normal; there were no regional wall motion abnormalities   PHYSICAL EXAM General - Well nourished, well developed pleasant elderly African-American male, in no acute distress. Ophthalmologic - fundi not visualized due  to noncooperation. Cardiovascular - Regular rate and rhythm. Neuro - awake alert, eyes open, follows simple commands, paucity of speech, moderate dysarthria, able to repeat sentences and name. Eyes no gaze preference, no neglect, blinking to visual threat bilaterally. PERRL, right facial droop, tongue midline. RUE 4/5 with drift, BLE 4/5, LUE 5/5. DTR 1+ and no babinski. Sensation symmetrical, coordination intact bilaterally, gait deferred.    ASSESSMENT/PLAN [name] is a 77 y.o. 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 No tPA given due to concern of brain metastasis.    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 cardioembolic source  Resultant left gaze  preference, right facial droop and right UE mild weakness  MRI  Left BG, mesial temporal lobe small infarcts and left MCA punctate infarcts  MRA  Patent left ICA and MCA  CTA head and neck - left ICA and left M1 occlusion  DSA - TICI3 reperfusion, left ICA stenting  2D Echo   EF 60-65% with no source of embolus  TEE negative for clot or PFO  If TEE negative, a [facility] electrophysiologist will consult and consider placement of an implantable loop recorder to evaluate for atrial fibrillation as etiology of stroke. This has been explained to patient/family by Dr. Leonie Man and they are agreeable.   LDL 69  HgbA1c 9.1  subq heparin for VTE prophylaxis Fall precautions  Diet NPO time specified   aspirin 81 mg daily prior to admission, now on aspirin 325 mg daily and Plavix  Ongoing aggressive stroke risk factor management  Therapy recommendations:  CIR  Disposition:  CIR once workup completed  Small cell lung cancer   11/2016 brain MRI no brain metastasis  On chemo and radiation  Follows with [facility]  Discussed with Dr. Rogue Bussing, pt primary oncologist, no concern of hypercoagulable state at this time, he needs further cardiac work up  Diabetes 2  HgbA1c 9.1 goal < 7.0  improving  On insulin, home meds  CBG monitoring  SSI prn  Hypertension, hypotension resolved Likely due to ICA stenting  Long term BP goal normotensive  Treated w/ albumin  Now on lisinopril and hydrochlorothiazide. Recommend holding home antihypertensive  Hyperlipidemia  Home meds:  none   LDL 69, goal < 70  Statin allergy listed in file  CKD  Cre 1.98->1.80->1.88->1.7  At baseline   Continue IVF  Other Stroke Risk Factors  Advanced age  Former smoker - quit 13 years ago  Other Active Problems  GERD, without esophagitis, on Pepcid   Anemia, Hgb 10.3  Hospital day # [facility] for  Pager information 05/10/2017 1:45 PM  I have personally examined this patient, reviewed notes, independently viewed imaging studies, participated in medical decision making and plan of care.ROS completed by me personally and pertinent positives fully documented  I have made any additions or clarifications directly to the above note. Agree with note above.   Antony Contras, MD Medical Director [place] Pager: [phone] 05/10/2017 3:51 PM   To contact Stroke Continuity provider, please refer to [email or web address] After hours, contact General Neurology

## 2017-05-10 NOTE — Consult Note (Addendum)
ELECTROPHYSIOLOGY CONSULT NOTE  Patient ID: Edwin Jones MRN: [id number], DOB/AGE: December 21, 1940   Admit date: 05/06/2017 Date of Consult: 05/10/2017  Primary Physician: Marguerita Merles, MD Primary Cardiologist: none Reason for Consultation: Cryptogenic stroke ; recommendations regarding Implantable Loop Recorder, requested by Dr. Leonie Man  History of Present Illness Edwin Jones was admitted on 05/06/2017 with stroke.   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  They first developed symptoms while at home, r sided weakness and facial droop.  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 cardioembolic source.  In review of neurology notes, they d/w oncologist and not felt to be hypercoaguable and recommended cardiac w/u.    he has undergone workup for stroke including echocardiogram and carotid angio/stending/intervention to L ICA as noted.  The patient has been monitored on telemetry which has demonstrated sinus rhythm with no AFib.  Inpatient stroke work-up is to be completed with a TEE.   Echocardiogram this admission demonstrated  Study Conclusions - Left ventricle: The cavity size was normal. There was mild   concentric hypertrophy. Systolic function was normal. The   estimated ejection fraction was in the range of 60% to 65%. Wall   motion was normal; there were no regional wall motion   abnormalities. Doppler parameters are consistent with abnormal   left ventricular relaxation (grade 1 diastolic dysfunction).   Doppler parameters are consistent with high ventricular filling   pressure. - Aortic valve: Transvalvular velocity was within the normal range.   There was no stenosis. There was no regurgitation. - Mitral valve: Transvalvular velocity was within the normal range.   There was  no evidence for stenosis. There was trivial   regurgitation. - Left atrium: The atrium was moderately dilated. - Right ventricle: The cavity size was normal. Wall thickness was   normal. Systolic function was normal. - Right atrium: The atrium was mildly dilated. - Atrial septum: No defect or patent foramen ovale was identified   by color flow Doppler. - Tricuspid valve: There was mild regurgitation. - Pulmonary arteries: Systolic pressure was mildly increased. PA   peak pressure: 44 mm Hg (S).  Lab work is reviewed.  Prior to admission, the patient denies chest pain, shortness of breath, dizziness, palpitations, or syncope. He is recovering from their stroke with plans to CIR at discharge.   Past Medical History:  Diagnosis Date  . Cancer ([place])   . COPD (chronic obstructive pulmonary disease) ([place])   . Diabetes mellitus without complication ([place])   . GERD without esophagitis   . Hepatitis C virus    history of hep C treated with Harvoni  . Hypercholesterolemia   . Hypertension   . Seasonal allergies      Surgical History:  Past Surgical History:  Procedure Laterality Date  . COLONOSCOPY     approximately 11 years ago  . FLEXIBLE BRONCHOSCOPY N/A 11/05/2016   Procedure: FLEXIBLE BRONCHOSCOPY;  Surgeon: Wilhelmina Mcardle, MD;  Location: [facility] ORS;  Service: Pulmonary;  Laterality: N/A;  . IR FLUORO GUIDE CV LINE RIGHT  05/06/2017  . IR INTRAVSC STENT CERV CAROTID W/O EMB-PROT MOD SED INC ANGIO  05/06/2017  . IR PERCUTANEOUS ART THROMBECTOMY/INFUSION INTRACRANIAL INC DIAG ANGIO  05/06/2017  . IR US GUIDE VASC ACCESS LEFT  05/06/2017  . IR US GUIDE VASC ACCESS RIGHT  05/06/2017  . IR US GUIDE VASC ACCESS RIGHT  05/06/2017  . PORTA CATH INSERTION N/A 11/23/2016   Procedure: Glori Luis Cath Insertion;  Surgeon: Algernon Huxley, MD;  Location: [place] [facility];  Service: Cardiovascular;  Laterality: N/A;  . RADIOLOGY WITH ANESTHESIA N/A 05/06/2017   Procedure: IR WITH ANESTHESIA;  Surgeon:  Radiologist, Medication, MD;  Location: [place];  Service: Radiology;  Laterality: N/A;  . TONSILLECTOMY       Medications Prior to Admission  Medication Sig Dispense Refill Last Dose  . albuterol (PROVENTIL HFA;VENTOLIN HFA) 108 (90 Base) MCG/ACT inhaler Inhale 2 puffs into the lungs every 6 (six) hours as needed for wheezing or shortness of breath. 1 Inhaler 2 unknown  . APPLE CIDER VINEGAR PO Take 450 mg by mouth at bedtime.    05/05/2017 at Unknown time  . aspirin EC 81 MG tablet Take 81 mg by mouth at bedtime.    05/05/2017 at Unknown time  . cetirizine (ZYRTEC) 10 MG tablet Take 10 mg by mouth daily.   Past Week at Unknown time  . cholecalciferol (VITAMIN D) 1000 units tablet Take 1,000 Units by mouth daily.   05/05/2017 at Unknown time  . Coenzyme Q10 100 MG capsule Take 100 mg by mouth daily.    05/05/2017 at Unknown time  . Flaxseed, Linseed, (FLAXSEED OIL) 1000 MG CAPS Take 1,000 mg by mouth at bedtime.    05/05/2017 at Unknown time  . fluticasone (FLONASE) 50 MCG/ACT nasal spray Place 1 spray into both nostrils daily as needed for allergies or rhinitis.   unknown  . Fluticasone-Salmeterol (ADVAIR DISKUS) 500-50 MCG/DOSE AEPB Inhale 1 puff into the lungs 2 (two) times daily. 1 each 3 unknown  . glipiZIDE (GLUCOTROL) 10 MG tablet Take 10 mg by mouth daily. Time release capsule   05/05/2017 at Unknown time  . glucagon (GLUCAGON EMERGENCY) 1 MG injection Inject 1 mg into the muscle once as needed. When blood glucose drops less than 50; and if patient has difficulty drinking. 1 each 12 unknown  . GNP GARLIC EXTRACT PO Take 7,616 mg by mouth daily.    05/05/2017 at Unknown time  . hydrochlorothiazide (HYDRODIURIL) 25 MG tablet Take 25 mg by mouth daily.   05/05/2017 at Unknown time  . linagliptin (TRADJENTA) 5 MG TABS tablet Take 5 mg by mouth daily.   05/05/2017 at Unknown time  . lisinopril (PRINIVIL,ZESTRIL) 10 MG tablet Take 10 mg by mouth daily.   Past Week at Unknown time  . methocarbamol  (ROBAXIN) 500 MG tablet Take 1 tablet (500 mg total) by mouth every 8 (eight) hours as needed for muscle spasms. 60 tablet 0 unknown  . Multiple Vitamin (MULTIVITAMIN WITH MINERALS) TABS tablet Take 1 tablet by mouth daily.   05/05/2017 at Unknown time  . omeprazole (PRILOSEC) 20 MG capsule Take 20 mg by mouth every Monday, Wednesday, and Friday.    05/05/2017 at Unknown time  . simethicone (MYLICON) 80 MG chewable tablet Chew 80 mg by mouth as needed for flatulence.    unknown  . vitamin C (ASCORBIC ACID) 250 MG tablet Take 250 mg by mouth daily.   05/05/2017 at Unknown time  . zinc sulfate 220 (50 Zn) MG capsule Take 220 mg by mouth daily.   05/05/2017 at Unknown time  . dexamethasone (DECADRON) 4 MG tablet Take 1 tablet (4 mg total) by mouth daily. Starting on the first day of radiation (Patient not taking: Reported on 05/06/2017) 25 tablet 0 Not Taking at  Unknown time  . lidocaine-prilocaine (EMLA) cream Apply 1 application topically as needed. (Patient not taking: Reported on 05/06/2017) 30 g 3 Not Taking at Unknown time  . ondansetron (ZOFRAN) 8 MG tablet Take 1 tablet (8 mg total) by mouth every 8 (eight) hours as needed for nausea or vomiting (start 3 days; after chemo). (Patient not taking: Reported on 03/25/2017) 40 tablet 1 Not Taking at Unknown time  . prochlorperazine (COMPAZINE) 10 MG tablet Take 1 tablet (10 mg total) by mouth every 6 (six) hours as needed for nausea or vomiting. (Patient not taking: Reported on 03/25/2017) 40 tablet 1 Not Taking at Unknown time  . ZINC-VITAMIN C MT Take 1 tablet daily by mouth.   Not Taking at Unknown time    Inpatient Medications:  .  stroke: mapping our early stages of recovery book   Does not apply Once  . acyclovir ointment   Topical TID  . aspirin  325 mg Oral Daily  . Chlorhexidine Gluconate Cloth  6 each Topical Daily  . clopidogrel  75 mg Oral Daily  . fludrocortisone  0.1 mg Oral Daily  . fluticasone furoate-vilanterol  1 puff Inhalation Daily    . glipiZIDE  10 mg Oral Daily  . heparin injection (subcutaneous)  5,000 Units Subcutaneous Q8H  . insulin aspart  0-15 Units Subcutaneous TID WC  . insulin aspart  0-5 Units Subcutaneous QHS  . insulin aspart  4 Units Subcutaneous TID WC  . linagliptin  5 mg Oral Daily  . methylPREDNISolone (SOLU-MEDROL) injection  125 mg Intravenous Once  . midodrine  5 mg Oral TID WC    Allergies:  Allergies  Allergen Reactions  . Penicillin G Anaphylaxis    Other reaction(s): Other (See Comments) Couldn't breath among other things Has patient had a PCN reaction causing immediate rash, facial/tongue/throat swelling, SOB or lightheadedness with hypotension: Yes Has patient had a PCN reaction causing severe rash involving mucus membranes or skin necrosis: No Has patient had a PCN reaction that required hospitalization: Yes Has patient had a PCN reaction occurring within the last 10 years: No If all of the above answers are "NO", then may proceed with Ce  . Shellfish-Derived Products Anaphylaxis  . Statins     Other reaction(s): Other (See Comments) Muscle spasms - can't walk - couldn't turn over in bed  . Erythromycin Itching    All mycins  . Tamsulosin     Other reaction(s): Dizziness  . Tuberculin Ppd     Other reaction(s): Other (See Comments) False test   . Iodinated Diagnostic Agents Hives    Social History   Socioeconomic History  . Marital status: Married    Spouse name: Not on file  . Number of children: Not on file  . Years of education: Not on file  . Highest education level: Not on file  Social Needs  . Financial resource strain: Not on file  . Food insecurity - worry: Not on file  . Food insecurity - inability: Not on file  . Transportation needs - medical: Not on file  . Transportation needs - non-medical: Not on file  Occupational History  . Not on file  Tobacco Use  . Smoking status: Former Smoker    Packs/day: 1.00    Years: 50.00    Pack years: 50.00     Types: Cigarettes    Last attempt to quit: 04/07/2004    Years since quitting: 13.0  . Smokeless tobacco: Never Used  Substance and Sexual Activity  .  Alcohol use: No    Comment: history of alcohol use  . Drug use: No  . Sexual activity: Yes  Other Topics Concern  . Not on file  Social History Narrative  . Not on file     Family History  Problem Relation Age of Onset  . Skin cancer Mother   . Ovarian cancer Sister        sister was diagnosed as a teenager  . Throat cancer Brother 34       brother #1  . Lung cancer Sister 51  . Throat cancer Brother 76       brother #2  . Skin cancer Sister       Review of Systems: All other systems reviewed and are otherwise negative except as noted above.  Physical Exam: Vitals:   05/10/17 0044 05/10/17 0425 05/10/17 0744 05/10/17 0748  BP: 137/72 138/84  (!) 148/67  Pulse: 72 61  65  Resp: _0 Temp: 98.7 F (37.1 C) 98.9 F (37.2 C)  98.5 F (36.9 C)  TempSrc: Oral Oral  Oral  SpO2: 99% 98% 93% 97%  Weight:      Height:        GEN- The patient is well appearing, alert, has some aphasia, is able to tell me his name, nods/shakes his head yes/no appropriately.   Head- normocephalic, atraumatic Eyes-  Sclera clear, conjunctiva pink Ears- hearing intact Oropharynx- clear Neck- supple Lungs- CTA b/l, normal work of breathing Heart- RRR, no murmurs, rubs or gallops  GI- soft, NT, ND Extremities- no clubbing, cyanosis, or edema MS- no significant deformity or atrophy Skin- no rash or lesion Psych- euthymic mood, full affect   Labs:   Lab Results  Component Value Date   WBC 4.9 05/10/2017   HGB 10.3 (L) 05/10/2017   HCT 32.1 (L) 05/10/2017   MCV 95.8 05/10/2017   PLT 181 05/10/2017    Recent Labs  Lab 05/06/17 0312  05/10/17 0425  NA 135   < > 139  K 4.3   < > 4.1  CL 101   < > 110  CO2 22   < > 18*  BUN 40*   < > 28*  CREATININE 1.98*   < > 1.70*  CALCIUM 9.1   < > 8.1*  PROT 6.6  --   --   BILITOT  0.4  --   --   ALKPHOS 100  --   --   ALT 13*  --   --   AST 16  --   --   GLUCOSE 383*   < > 141*   < > = values in this interval not displayed.   Lab Results  Component Value Date   TROPONINI <0.03 03/18/2017   Lab Results  Component Value Date   CHOL 173 05/07/2017   Lab Results  Component Value Date   HDL 61 05/07/2017   Lab Results  Component Value Date   LDLCALC 69 05/07/2017   Lab Results  Component Value Date   TRIG 215 (H) 05/07/2017   Lab Results  Component Value Date   CHOLHDL 2.8 05/07/2017   No results found for: LDLDIRECT  No results found for: DDIMER   Radiology/Studies:  Ct Angio Head W Or Wo Contrast Result Date: 05/06/2017 CLINICAL DATA:  77 y/o M; right-sided weakness, numbness, slurred speech. EXAM: CT ANGIOGRAPHY HEAD AND NECK TECHNIQUE: Multidetector CT imaging of the head and neck was performed using the standard protocol  during bolus administration of intravenous contrast. Multiplanar CT image reconstructions and MIPs were obtained to evaluate the vascular anatomy. Carotid stenosis measurements (when applicable) are obtained utilizing NASCET criteria, using the distal internal carotid diameter as the denominator. CONTRAST:  79m ISOVUE-370 IOPAMIDOL (ISOVUE-370) INJECTION 76% COMPARISON:  None. FINDINGS: CTA NECK FINDINGS Aortic arch: Standard branching. Imaged portion shows no evidence of aneurysm or dissection. No significant stenosis of the major arch vessel origins. Right carotid system: No evidence of dissection, stenosis (50% or greater) or occlusion. Left carotid system: Patent left common carotid artery. Left ICA occlusion to the terminus where there is a short segment of reconstitution of the terminal ICA and proximal left M1 via the anterior communicating artery. Vertebral arteries: Codominant. No evidence of dissection, stenosis (50% or greater) or occlusion. Skeleton: Moderate cervical spine spondylosis with multilevel discogenic degenerative  changes greatest at the C4 through C7 levels or uncovertebral and facet hypertrophy encroach on neural foramen and there is mild canal stenosis. Other neck: Negative. Upper chest: Right port catheter in the SVC extending below field of view. Review of the MIP images confirms the above findings CTA HEAD FINDINGS Anterior circulation: Left mid M1 occlusion with poor left MCA distribution collateral circulation. Right distal M1 mild stenosis. No additional large vessel occlusion, aneurysm, or vascular malformation in the anterior circulation. Posterior circulation: No significant stenosis, proximal occlusion, aneurysm, or vascular malformation. Right P2 moderate stenosis and left P2 mild stenosis. Venous sinuses: As permitted by contrast timing, patent. Anatomic variants: Patent anterior and bilateral posterior communicating arteries. Review of the MIP images confirms the above findings IMPRESSION: 1. 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 Short segment of patency of left ICA terminus and proximal left M1 via anterior communicating artery. 2. Left mid M1 occlusion with poor left MCA collateralization. 3. Patent right carotid and bilateral vertebral systems. 4. Patent bilateral ACA, right MCA, and bilateral PCA distributions. Intracranial atherosclerosis with multiple segments of mild-to-moderate stenosis. These results were called by telephone at the time of interpretation on 05/06/2017 at 4:10 am to Dr. LCheral Marker who verbally acknowledged these results. Electronically Signed   By: LKristine GarbeM.D.   On: 05/06/2017 04:17     Mr MVirgel PalingWAYContrast Result Date: 05/06/2017 CLINICAL DATA:  [age]year old male who presented with emergent large vessel occlusion of the left ICA status post endovascular reperfusion earlier today. Personal history of prior whole brain radiation for cancer, but no known brain metastases. EXAM: MRI HEAD WITHOUT CONTRAST MRA HEAD WITHOUT CONTRAST TECHNIQUE: Multiplanar,  multiecho pulse sequences of the brain and surrounding structures were obtained without intravenous contrast. Angiographic images of the head were obtained using MRA technique without contrast. COMPARISON:  Cerebral angiogram, CTA head and neck, and noncontrast head CT earlier today. Prior brain MRI 11/19/2016. FINDINGS: MRI HEAD FINDINGS Brain: Restricted diffusion in the left basal ganglia (series 3, image 32) with T2 and FLAIR hyperintensity. No associated hemorrhage. No significant mass effect. There is a small 9 millimeter area of restricted diffusion in the medial left temporal lobe at the amygdala (series 3, image 20). There are otherwise a small number of tiny scattered foci of restricted diffusion in the left MCA territory (such as series 3, image 40). There is no right hemisphere or posterior circulation restricted diffusion. Patchy and scattered bilateral cerebral white matter T2 and FLAIR hyperintensity elsewhere is chronic and stable. The right deep gray matter nuclei, left thalamus, brainstem, and cerebellum remain normal for age. No midline shift, mass effect, evidence of  mass lesion, ventriculomegaly, extra-axial collection or acute intracranial hemorrhage. Cervicomedullary junction and pituitary are within normal limits. Vascular: Major intracranial vascular flow voids are preserved, the left ICA flow void was abnormal in 2018 and appears mildly improved today. Negative visible cervical spine.  Normal bone marrow signal. There is mild leftward gaze deviation. Orbits soft tissues otherwise appear normal. Paranasal Visualized paranasal sinuses and mastoids are stable and well pneumatized. Scalp and face soft tissues appear negative. MRA HEAD FINDINGS Antegrade flow in the posterior circulation with codominant distal vertebral arteries. Mild distal vertebral irregularity with no stenosis. Patent vertebrobasilar junction without stenosis. Dominant appearing AICA origins are patent. Mild basilar artery  irregularity without stenosis. SCA origins are patent. Bilateral PCA origins appear normal. Posterior communicating arteries are diminutive or absent. Bilateral PCA branches are mildly irregular, and there is mild right PCA P2 segment stenosis which is stable from earlier today. Antegrade flow in both ICA siphons. Mild luminal narrowing and irregularity throughout the visible left ICA may reflect residual thrombus or plaque. The left ICA is patent to the left terminus. The left MCA and ACA origins are patent. There is mild to moderate irregularity at the left ACA origin and proximal A1 segment which is stable from earlier today. There is mild irregularity scratched at the left MCA and its branches now are patent. There is mild left M1 irregularity. The left MCA bifurcation is patent with no branch occlusion identified. Antegrade flow in the right ICA siphon which is only mildly irregular and without stenosis. Normal right ophthalmic artery origin. Normal right ICA terminus, right ACA and MCA origins. The anterior communicating artery is normal. The visible ACA branches are stable, with mild irregularity of the distal left ACA. The right MCA M1 segment is patent with mild irregularity just proximal to the bifurcation which is stable. The right MCA branches are stable and within normal limits. IMPRESSION: 1. Restored patency of the Left ICA, terminus, and Left MCA since earlier today. Mild residual irregularity throughout the left ICA siphon, and in the left M1 segment. Stable mild to moderate irregularity in the left A1 and distal A2 segments. 2. Associated core infarct primarily confined to the Left Basal Ganglia. There is a small 9 mm infarct in the left amygdala, and there are small number of scattered tiny cortical infarcts elsewhere in the Left MCA territory. 3. Mild cytotoxic edema with no associated hemorrhage or mass effect. 4. Mild posterior and right anterior circulation atherosclerosis, stable from the CTA  earlier today. Electronically Signed   By: Genevie Ann M.D.   On: 05/06/2017 13:43     Ir Sherre Lain Stent Cerv Carotid W/o Emb-prot Mod Sed Result Date: 05/06/2017 INDICATION: 77 year old male with a history of acute left MCA stroke with right-sided symptoms, and CT imaging demonstrating left ICA occlusion and left M1 occlusion. 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 The antibiotic was administered within 1 hour of the procedure 75 MCG NITROGLYCERIN INTRA ARTERIAL 650 mg aspirin via orogastric tube. Intra procedural  initiation of Integrilin IV drip for dual anti-platelet therapy. ANESTHESIA/SEDATION: General endotracheal tube anesthesia with anesthesia team CONTRAST:  144 cc IV contrast FLUOROSCOPY TIME:  Fluoroscopy Time: 54 minutes 6 seconds (2,926 mGy). COMPLICATIONS: None TECHNIQUE: Informed written consent was obtained from the patient's family after a thorough discussion of the procedural risks, benefits and alternatives. Specific risks discussed include: Bleeding, infection, contrast reaction, kidney injury/failure, need for further procedure/surgery, arterial injury or dissection, embolization to new territory, intracranial hemorrhage (10-15% risk), neurologic deterioration, cardiopulmonary collapse, death. All questions were addressed. Maximal Sterile Barrier Technique was utilized including during  the procedure including caps, mask, sterile gowns, sterile gloves, sterile drape, hand hygiene and skin antiseptic. A timeout was performed prior to the initiation of the procedure. FINDINGS: Initial angiogram: Left common carotid artery:  Normal course caliber and contour. Left external carotid artery: Patent with antegrade flow. On the initial injection before treatment, there is minimal opacification of the carotid siphon secondary to meningeal branches which appear to be region ating from ascending pharyngeal distribution and potentially deep meningeal branches from the external carotid artery. No significant filling of the MCA on the initial injection The initial injection does confirmed perfusion of the lenticulostriate vasculature, as well as a patent anterior coronal artery, with minimal filling of temporal lobe. Anterior basal ganglia structures are perfused. Left internal carotid artery: Internal carotid artery is occluded at the origin with a string sign at the posterior aspect of the artery. Left MCA: Initial injection demonstrates no significant filling of the middle cerebral artery. 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 Left ACA: Anterior cerebral artery perfused by the right-sided flow at the initiation. Final angiogram: After treatment of the carotid occlusion and deployment of 8 mm-6 mm x 40 mm carotid stent, there is excellent flow through the cervical carotid to the intracranial carotid. After mechanical thrombectomy of M1 occlusion, there is modified treatment in cerebral ischemia of 3. Flat panel CT: No complications with small contrast stain of the left basal ganglia. PROCEDURE: Patient was brought to the neurointerventional suite, with the patient identity confirmed, and consent form confirmed. General anesthesia was present for initiation of general endotracheal tube anesthesia. Patient was prepped and draped in the usual  sterile fashion. The only venous access was a port catheter with single needle access. Ultrasound survey of the right inguinal region was performed with images stored and sent to PACs. 11 blade scalpel was used to make a small incision. A micropuncture needle was used access the right common femoral artery under ultrasound. With excellent arterial blood flow returned, an .018 micro wire was passed through the needle, observed to enter the abdominal aorta under fluoroscopy. The needle was removed, and a micropuncture sheath was placed over the wire. The inner dilator and wire were removed, and an 035 Bentson wire was advanced under fluoroscopy into the abdominal aorta. The sheath was removed and a standard 5 [place] vascular sheath was placed. The dilator was removed and the sheath was flushed. After the initial common femoral artery access, ultrasound survey of the left inguinal region was performed in order to place arterial monitoring line. Images stored and sent to PACs. Micropuncture needle was used access the left common femoral artery under ultrasound. With excellent arterial blood flow returned, an .018 micro wire was passed through the needle, observed to enter the abdominal aorta under fluoroscopy. The needle was removed, and a micropuncture sheath was placed over the wire. The inner dilator  and wire were removed, and an 035 Bentson wire was advanced under fluoroscopy into the abdominal aorta. The sheath was removed and a standard 4 [place] vascular sheath was placed. The dilator was removed and the sheath was flushed. A 71F JB-1 diagnostic catheter was advanced over the wire through the right common femoral artery access to the proximal descending thoracic aorta. Wire was then removed. Double flush of the catheter was performed. Catheter was then used to select the left common carotid artery. Angiogram was performed. Using roadmap technique, the catheter was advanced over a roadrunner wire into right common  carotid artery. Formal angiogram was performed. Catheter was advanced over the roadrunner wire into the external carotid artery. Wire was removed. Exchange length Rosen wire was then passed through the diagnostic catheter to the distal common carotid artery and the diagnostic catheter was removed. The 5 French sheath was removed and exchanged for 8 French 55 centimeter BrightTip sheath. Sheath was flushed and attached to pressurized and heparinized saline bag for constant forward flow. Then an 8 [place], 95 cm Flowgate balloon tip catheter was prepared on the back table with inflation of the balloon with 50/50 concentration of dilute contrast. The balloon catheter was then advanced over the wire, positioned into the distal common carotid artery. Copious back flush was performed and the balloon catheter was attached to heparinized and pressurized saline bag for forward flow. Roadmap was then performed at the carotid bifurcation. Combination of a rapid transit microwire and a synchro soft wire were used to navigate beyond the occlusion at the proximal internal carotid artery. The microcatheter was then navigated into the distal cervical segment, wire was removed, blood was aspirated, and a gentle injection confirmed a luminal position. Exchange length Transcend wire was then placed through the microcatheter into the cervical carotid. Microcatheter was removed. Balloon angioplasty was then performed with a rapid exchange system, using a Viatrac balloon 5 mm x 30 mm. The patient required treatment at this time with atropine dose, as we experienced bradycardia into the 30s. His heart rate recovered after treatment by the anesthesia team. Balloon was removed. Angiogram confirmed flow into the cervical carotid. A coaxial system of an Ace 64 penumbra aspiration catheter and the Trevo pro view 18 catheter was then advanced over the exchange length wire into the cervical carotid. Once the coaxial system was in the cervical  carotid, the microwire was removed and exchanged for synchro soft. The synchro soft wire was navigated beyond the occlusion under roadmap guidance, and the aspiration catheter was advanced into the ICA terminus. The balloon guide catheter was then advanced into the mid segment of the cervical carotid artery. The microcatheter and microwire were then carefully advanced through the occluded segment. Microcatheter was then push through the occluded segment and the wire was removed. Blood was then aspirated through the hub of the microcatheter, and a gentle contrast injection was performed confirming intraluminal position. A rotating hemostatic valve was then attached to the back end of the microcatheter, and a pressurized and heparinized saline bag was attached to the catheter. 4 x 40 solitaire device was then selected. Back flush was achieved at the rotating hemostatic valve, and then the device was gently advanced through the microcatheter to the distal end. The retriever was then unsheathed by withdrawing the microcatheter under fluoroscopy. A 5 minutes interval was observed. At this time, ultrasound-guided access of the right common femoral vein was performed for placement of a additional venous access and placement of a 12 French triple-lumen  IV catheter. We then proceeded with the mechanical thrombectomy. The balloon at the balloon tip catheter was then inflated under fluoroscopy for proximal flow arrest. Constant aspiration was then performed at the penumbra aspiration catheter at the carotid terminus as the retriever was gently and slowly withdrawn with fluoroscopic observation. Once the retriever was entirely removed from the system, free aspiration was confirmed at the hub of the aspiration catheter, with free blood return confirmed. The balloon was then deflated, rotating hemostatic valve was reattached, and a control angiogram was performed, confirming restoration of flow. Penumbra catheter was then  withdrawn, and the angiogram was performed at the carotid bifurcation. Given the appearance, we elected to place acute carotid stent. The lesion was then again navigated with synchro soft wire and rapid transit. With the microwire microcatheter positioned in the distal cervical carotid, the microwire was removed. The exchange length Transcend wire was then replaced and the microcatheter was removed. Acute carotid stent was then deployed, 8 mm-6 mm x 40 mm length. Before deployment, we initiated Integrilin drip treatment for dual anti-platelet therapy. Angiogram was performed. Coaxial system of the penumbra aspiration catheter and the pro view 18 catheter were then navigated to the carotid siphon for better angiogram of the cerebral vasculature. Angiogram was performed. Microcatheter, a number aspiration catheter were withdrawn. The balloon guide catheter was positioned in the common carotid artery for final angiogram. All catheter and wires were removed. The sheath at the right common femoral artery access was withdrawn for an angiogram. Eight French Angio-Seal was then deployed for hemostasis. A flat panel CT was then performed.  Patient was then extubated. Estimated blood loss was 50 cc No complications encountered. IMPRESSION: Status post left-sided cerebral angiogram with treatment of left ICA/MCA tandem occlusion, requiring acute left carotid stenting and mechanical thrombectomy of a left M1 occlusion with achievement of mTICI-3 flow, and restoration of cervical ICA flow. Flat panel CT in the room demonstrates small contrast stain without complicating features, and the patient was extubated. Status post deployment of 8 French Angio-Seal for right common femoral artery hemostasis. Status post ultrasound-guided left common femoral artery access with a 4 French sheath for arterial monitoring, which remained after the case. Status post ultrasound-guided right common femoral vein access for additional IV access,  which remained after the case transmitting the Integrilin drip. Signed, Dulcy Fanny. Earleen Newport, DO [facility] Radiology PLAN: Patient will go to the PACU ICU admission Bilateral hips will be straight until the left 4 French arterial monitoring sheath is removed and the right common femoral vein venous access sheath is removed. Routine wound management for the right common femoral artery sheath access. Frequent neurovascular checks. IV saline hydration for 8 hours given the renal insufficiency Blood pressure control management of 120-140 with nicardipine drip ordered The goal will be dual anti-platelet therapy, for now with dose of 650 mg aspirin and the Integrilin drip. Electronically Signed   By: Corrie Mckusick D.O.   On: 05/06/2017 09:24     Dg Chest Port 1 View Result Date: 05/08/2017 CLINICAL DATA:  Abnormal chest x-ray. EXAM: PORTABLE CHEST 1 VIEW COMPARISON:  Radiograph of May 06, 2017. FINDINGS: Stable cardiomegaly. Right internal jugular Port-A-Cath is unchanged in position. No pneumothorax is noted. Mild right midlung subsegmental atelectasis is noted. 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 Bony thorax is unremarkable. IMPRESSION: Stable left perihilar and basilar opacities are noted as described above.  Mild right midlung subsegmental atelectasis. Electronically Signed   By: Marijo Conception, M.D.   On: 05/08/2017 15:38    12-lead ECG SR All prior EKG's in EPIC reviewed with no documented atrial fibrillation  Telemetry SR, occ PACs/PVCs, once very short PAT, no AFib  Assessment and Plan:  1. Cryptogenic stroke The patient presents with cryptogenic stroke.  The patient has a TEE planned for this AM.  I spoke at length with the patient and his wife at bedside about monitoring for afib with either a 30 day event monitor or an implantable loop  recorder.   Risks, benefits, and alteratives to implantable loop recorder were discussed with the patient today.   The patient has difficulty with speech, though when asked if he would like to pursue with the ILR he says no, when asked if he would like to do 30 day monitoring he says yes, and in d/w his him and his wife this is what he is most comfortable with.  I will defer these arrangements to his neurologist/primary team.   Should the patient change his mind and want to persue loop implant, please recall EP service.     Renee Dyane Dustman, PA-C 05/10/2017   EP attending  Patient seen and examined.  Agree with the findings as noted above.  The patient is a 77 year old man with a cryptogenic stroke who is referred for consideration for implantable loop recorder.  On review with the patient, he would prefer to obtain a 30-day heart monitor rather than proceed immediately to the loop recorder.  We will review and schedule as an outpatient.  If no obvious cause of his stroke is determined on a 30-day monitor, would consider loop recorder at that time.  Crissie Sickles, MD

## 2017-05-10 NOTE — Interval H&P Note (Signed)
History and Physical Interval Note:  05/10/2017 2:10 PM  Edwin Jones  has presented today for surgery, with the diagnosis of STROKE  The various methods of treatment have been discussed with the patient and family. After consideration of risks, benefits and other options for treatment, the patient has consented to  Procedure(s): TRANSESOPHAGEAL ECHOCARDIOGRAM (TEE) (N/A) as a surgical intervention .  The patient's history has been reviewed, patient examined, no change in status, stable for surgery.  I have reviewed the patient's chart and labs.  Questions were answered to the patient's satisfaction.     Dub Maclellan

## 2017-05-10 NOTE — Progress Notes (Signed)
Physical Therapy Treatment Patient Details Name: Edwin Jones MRN: [id number] DOB: 1940-03-30 Today's Date: 05/10/2017    History of Present Illness Edwin Jones is a 77 y.o. 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 No tPA given due to concern of brain metastasis.  Per MRI, IR has restored patency of the left ICA, terminus and left MCA.  Showed infarct to the left BG, left amygdala and small scattered tiny cortical infarcts throughout the left MCA territory.    PT Comments    Patient progressing well towards PT goals. Tolerated gait training with min A for balance/safety today without use of RW for support. Pt does requires hands on assist for balance esp when fatigued. Continues to demonstrate short shuffling like gait pattern and fatigues resulting in more assist needed. Pt with difficulty stating correct b-day and wife's b-day without contextual cues. More verbalizations today with encouragement but not even close to cognitive/functional baseline per wife. Great CIR candidate. Will follow.   Follow Up Recommendations  CIR;Supervision/Assistance - 24 hour     Equipment Recommendations  None recommended by PT    Recommendations for Other Services       Precautions / Restrictions Precautions Precautions: Fall Restrictions Weight Bearing Restrictions: No    Mobility  Bed Mobility Overal bed mobility: Needs Assistance Bed Mobility: Supine to Sit     Supine to sit: Min guard;HOB elevated     General bed mobility comments: Min Guard for safety; no assist needed.   Transfers Overall transfer level: Needs assistance Equipment used: None Transfers: Sit to/from Stand Sit to Stand: Mod assist         General transfer comment: Assist to power to standing with pt reaching for UE assist on IV pole, slow to rise with cues for extension  throughout. Transferred to chair post ambulation.  Ambulation/Gait Ambulation/Gait assistance: Min assist;+2 safety/equipment Ambulation Distance (Feet): 120 Feet Assistive device: None Gait Pattern/deviations: Step-to pattern;Step-through pattern;Decreased step length - right;Decreased step length - left;Decreased stance time - left;Decreased stride length;Shuffle;Narrow base of support Gait velocity: decreased   General Gait Details: Slow, shuffling like gait pattern with decreased step length bilaterally; decreased stance time LLE and 2 standing rest breaks due to fatigue. Min A for balance, slight right lateral lean.    Stairs            Wheelchair Mobility    Modified Rankin (Stroke Patients Only) Modified Rankin (Stroke Patients Only) Pre-Morbid Rankin Score: No symptoms Modified Rankin: Moderately severe disability     Balance Overall balance assessment: Needs assistance Sitting-balance support: Feet supported;No upper extremity supported Sitting balance-Leahy Scale: Fair     Standing balance support: During functional activity Standing balance-Leahy Scale: Fair                              Cognition Arousal/Alertness: Awake/alert   Overall Cognitive Status: Impaired/Different from baseline Area of Impairment: Orientation;Memory;Problem solving;Following commands                 Orientation Level: Disoriented to;Place;Time("[place]" "November")   Memory: Decreased short-term memory Following Commands: Follows multi-step commands inconsistently     Problem Solving: Slow processing;Requires verbal cues General Comments: Pt not able to state his or wife's bday without contextual cues. Flat affect throughout with some appropriate smiling. Increased time to perform tasks. Per  wife, more verbal today but sitill confused not close to baseline.       Exercises      General Comments General comments (skin integrity, edema, etc.): Wife  present during session.      Pertinent Vitals/Pain Pain Assessment: No/denies pain    Home Living                      Prior Function            PT Goals (current goals can now be found in the care plan section) Progress towards PT goals: Progressing toward goals    Frequency    Min 3X/week      PT Plan Current plan remains appropriate    Co-evaluation              AM-PAC PT "6 Clicks" Daily Activity  Outcome Measure  Difficulty turning over in bed (including adjusting bedclothes, sheets and blankets)?: None Difficulty moving from lying on back to sitting on the side of the bed? : None Difficulty sitting down on and standing up from a chair with arms (e.g., wheelchair, bedside commode, etc,.)?: Unable Help needed moving to and from a bed to chair (including a wheelchair)?: A Little Help needed walking in hospital room?: A Little Help needed climbing 3-5 steps with a railing? : A Little 6 Click Score: 18    End of Session Equipment Utilized During Treatment: Gait belt Activity Tolerance: Patient tolerated treatment well Patient left: in chair;with call bell/phone within reach;with chair alarm set;with family/visitor present Nurse Communication: Mobility status PT Visit Diagnosis: Unsteadiness on feet (R26.81);Other abnormalities of gait and mobility (R26.89);Hemiplegia and hemiparesis Hemiplegia - Right/Left: Right Hemiplegia - dominant/non-dominant: Dominant Hemiplegia - caused by: Cerebral infarction     Time: 0223-3612 PT Time Calculation (min) (ACUTE ONLY): 29 min  Charges:  $Gait Training: 8-22 mins $Therapeutic Activity: 8-22 mins                    G Codes:       Wray Kearns, PT, DPT [phone]     [place] 05/10/2017, 11:23 AM

## 2017-05-10 NOTE — Progress Notes (Signed)
Consult progress note                                                                                Patient Demographics  Edwin Jones, is a 77 y.o. male, DOB - Oct 31, 1940, EXN:[id number]  Admit date - 05/06/2017   Admitting Physician Kerney Elbe, MD  Outpatient Primary MD for the patient is Marguerita Merles, MD  Outpatient specialists:   LOS - 4  days   Medical records reviewed and are as summarized below:    Chief Complaint  Patient presents with  . Code Stroke       Brief summary   Reason for consult: Hypotension  Edwin Jones is a 77 y.o. male with pmhx significant for COPD, CA, DM, GERD, Hep C, HLD, HTN and seasonal allergies. Pt had low BP after neo drip stopped. Pt had received midodrine, NS boluses and albumin. With no effect. Hospitalists consulted. Pt denies any bleeding and this was confirmed by nursing. PO intake has been >75% of meals. No N/V/D. Pt denies any dizziness, sob, cp.   Assessment & Plan    Principal Problem:   Acute ischemic left MCA stroke ([place]) - Per stroke service, primary  Active Problems:     Hypotension- resolved - Currently BP improved. Not on any vasopressors for last 48 hours.  No further hypotensive episodes - Lactic acid was elevated, 2.5 patient received IV fluid hydration, improved to 1.9.  - Random cortisol level 15.6 on 3/2 at 2 pm rules out adrenal insufficiency. ACTH stim test was done in the late afternoon yesterday, should be repeated in am if patient has any further hypotensive episodes or outpatient follow-up with endocrinology.   - Hemoglobin stable - Hold off on outpatient antihypertensives: lisinopril, HCTZ, for now.  - No infectious etiology - Currently on midodrine, Florinef.     GERD without esophagitis - Continue famotidine    Diabetes mellitus without complication (HCC) - Hemoglobin A1c 9.1, needs better control outpatient -Currently CBGs stable, continue  on sliding scale insulin, glipizide,  tradjenta. Placed diabetic coordinator consult.     Code Status: full  DVT Prophylaxis:  heparin  Family Communication: Discussed with patient and wife    Disposition Plan: per primary service (neurology). BP is now stable. I will sign off. Please reconsult hospitalist service if needed.   Time Spent in minutes   25 minutes    Medications  Scheduled Meds: .  stroke: mapping our early stages of recovery book   Does not apply Once  . acyclovir ointment   Topical TID  . aspirin  325 mg Oral Daily  . Chlorhexidine Gluconate Cloth  6 each Topical Daily  . clopidogrel  75 mg Oral Daily  . fludrocortisone  0.1 mg Oral Daily  . fluticasone furoate-vilanterol  1 puff Inhalation Daily  . glipiZIDE  10 mg Oral Daily  . heparin injection (subcutaneous)  5,000 Units Subcutaneous Q8H  . insulin aspart  0-15 Units Subcutaneous TID WC  . insulin aspart  0-5 Units Subcutaneous QHS  . insulin aspart  4 Units Subcutaneous TID WC  . linagliptin  5 mg Oral Daily  .  methylPREDNISolone (SOLU-MEDROL) injection  125 mg Intravenous Once  . midodrine  5 mg Oral TID WC   Continuous Infusions: . sodium chloride    . sodium chloride 50 mL/hr at 05/10/17 0616   PRN Meds:.acetaminophen **OR** acetaminophen (TYLENOL) oral liquid 160 mg/5 mL **OR** acetaminophen, albuterol, ondansetron (ZOFRAN) IV, senna-docusate, sodium chloride flush   Antibiotics   Anti-infectives (From admission, onward)   Start     Dose/Rate Route Frequency Ordered Stop   05/06/17 0444  vancomycin (VANCOCIN) 1-5 GM/200ML-% IVPB    Comments:  Margaretmary Dys   : cabinet override      05/06/17 0444 05/06/17 1659        Subjective:   Edwin Jones was seen and examined today.  Currently no complaints, BP stable, no acute events overnight.  Patient denies dizziness, chest pain, shortness of breath, abdominal pain, N/V/D/C, new weakness, numbess, tingling.    Objective:   Vitals:   05/10/17 0044 05/10/17 0425 05/10/17 0744  05/10/17 0748  BP: 137/72 138/84  (!) 148/67  Pulse: 72 61  65  Resp: 20 20  18   Temp: 98.7 F (37.1 C) 98.9 F (37.2 C)  98.5 F (36.9 C)  TempSrc: Oral Oral  Oral  SpO2: 99% 98% 93% 97%  Weight:      Height:        Intake/Output Summary (Last 24 hours) at 05/10/2017 0908 Last data filed at 05/10/2017 0616 Gross per 24 hour  Intake 1090.41 ml  Output 1300 ml  Net -209.59 ml     Wt Readings from Last 3 Encounters:  05/06/17 96.2 kg (212 lb 1.3 oz)  03/25/17 97.2 kg (214 lb 3.2 oz)  02/17/17 95.2 kg (209 lb 14.1 oz)     Exam   General: Alert and oriented x 3, NAD  Eyes:   HEENT:    Cardiovascular: S1 S2 auscultated, Regular rate and rhythm. No pedal edema b/l  Respiratory: CTAB  Gastrointestinal: Soft, nontender, nondistended, + bowel sounds  Ext: no pedal edema bilaterally  Neuro: no new deficits   Musculoskeletal: No digital cyanosis, clubbing  Skin: No rashes  Psych: Normal affect and demeanor, alert and oriented x3    Data Reviewed:  I have personally reviewed following labs and imaging studies  Micro Results Recent Results (from the past 240 hour(s))  MRSA PCR Screening     Status: None   Collection Time: 05/06/17  8:44 AM  Result Value Ref Range Status   MRSA by PCR NEGATIVE NEGATIVE Final    Comment:        The GeneXpert MRSA Assay (FDA approved for NASAL specimens only), is one component of a comprehensive MRSA colonization surveillance program. It is not intended to diagnose MRSA infection nor to guide or monitor treatment for MRSA infections. Performed at [facility], [place] [address], [place], [place] [address]     Radiology Reports Ct Angio Head W Or Wo Contrast  Result Date: 05/06/2017 CLINICAL DATA:  77 y/o M; right-sided weakness, numbness, slurred speech. EXAM: CT ANGIOGRAPHY HEAD AND NECK TECHNIQUE: Multidetector CT imaging of the head and neck was performed using the standard protocol during bolus administration of  intravenous contrast. Multiplanar CT image reconstructions and MIPs were obtained to evaluate the vascular anatomy. Carotid stenosis measurements (when applicable) are obtained utilizing NASCET criteria, using the distal internal carotid diameter as the denominator. CONTRAST:  57mL ISOVUE-370 IOPAMIDOL (ISOVUE-370) INJECTION 76% COMPARISON:  None. FINDINGS: CTA NECK FINDINGS Aortic arch: Standard branching. Imaged portion shows  no evidence of aneurysm or dissection. No significant stenosis of the major arch vessel origins. Right carotid system: No evidence of dissection, stenosis (50% or greater) or occlusion. Left carotid system: Patent left common carotid artery. Left ICA occlusion to the terminus where there is a short segment of reconstitution of the terminal ICA and proximal left M1 via the anterior communicating artery. Vertebral arteries: Codominant. No evidence of dissection, stenosis (50% or greater) or occlusion. Skeleton: Moderate cervical spine spondylosis with multilevel discogenic degenerative changes greatest at the C4 through C7 levels or uncovertebral and facet hypertrophy encroach on neural foramen and there is mild canal stenosis. Other neck: Negative. Upper chest: Right port catheter in the SVC extending below field of view. Review of the MIP images confirms the above findings CTA HEAD FINDINGS Anterior circulation: Left mid M1 occlusion with poor left MCA distribution collateral circulation. Right distal M1 mild stenosis. No additional large vessel occlusion, aneurysm, or vascular malformation in the anterior circulation. Posterior circulation: No significant stenosis, proximal occlusion, aneurysm, or vascular malformation. Right P2 moderate stenosis and left P2 mild stenosis. Venous sinuses: As permitted by contrast timing, patent. Anatomic variants: Patent anterior and bilateral posterior communicating arteries. Review of the MIP images confirms the above findings IMPRESSION: 1. Left ICA  occlusion from origin to terminus. Short segment of patency of left ICA terminus and proximal left M1 via anterior communicating artery. 2. Left mid M1 occlusion with poor left MCA collateralization. 3. Patent right carotid and bilateral vertebral systems. 4. Patent bilateral ACA, right MCA, and bilateral PCA distributions. Intracranial atherosclerosis with multiple segments of mild-to-moderate stenosis. These results were called by telephone at the time of interpretation on 05/06/2017 at 4:10 am to Dr. Cheral Marker, who verbally acknowledged these results. Electronically Signed   By: Kristine Garbe M.D.   On: 05/06/2017 04:17   Ct Angio Neck W And/or Wo Contrast  Result Date: 05/06/2017 CLINICAL DATA:  77 y/o M; right-sided weakness, numbness, slurred speech. EXAM: CT ANGIOGRAPHY HEAD AND NECK TECHNIQUE: Multidetector CT imaging of the head and neck was performed using the standard protocol during bolus administration of intravenous contrast. Multiplanar CT image reconstructions and MIPs were obtained to evaluate the vascular anatomy. Carotid stenosis measurements (when applicable) are obtained utilizing NASCET criteria, using the distal internal carotid diameter as the denominator. CONTRAST:  60mL ISOVUE-370 IOPAMIDOL (ISOVUE-370) INJECTION 76% COMPARISON:  None. FINDINGS: CTA NECK FINDINGS Aortic arch: Standard branching. Imaged portion shows no evidence of aneurysm or dissection. No significant stenosis of the major arch vessel origins. Right carotid system: No evidence of dissection, stenosis (50% or greater) or occlusion. Left carotid system: Patent left common carotid artery. Left ICA occlusion to the terminus where there is a short segment of reconstitution of the terminal ICA and proximal left M1 via the anterior communicating artery. Vertebral arteries: Codominant. No evidence of dissection, stenosis (50% or greater) or occlusion. Skeleton: Moderate cervical spine spondylosis with multilevel  discogenic degenerative changes greatest at the C4 through C7 levels or uncovertebral and facet hypertrophy encroach on neural foramen and there is mild canal stenosis. Other neck: Negative. Upper chest: Right port catheter in the SVC extending below field of view. Review of the MIP images confirms the above findings CTA HEAD FINDINGS Anterior circulation: Left mid M1 occlusion with poor left MCA distribution collateral circulation. Right distal M1 mild stenosis. No additional large vessel occlusion, aneurysm, or vascular malformation in the anterior circulation. Posterior circulation: No significant stenosis, proximal occlusion, aneurysm, or vascular malformation. Right P2 moderate  stenosis and left P2 mild stenosis. Venous sinuses: As permitted by contrast timing, patent. Anatomic variants: Patent anterior and bilateral posterior communicating arteries. Review of the MIP images confirms the above findings IMPRESSION: 1. 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 Short segment of patency of left ICA terminus and proximal left M1 via anterior communicating artery. 2. Left mid M1 occlusion with poor left MCA collateralization. 3. Patent right carotid and bilateral vertebral systems. 4. Patent bilateral ACA, right MCA, and bilateral PCA distributions. Intracranial atherosclerosis with multiple segments of mild-to-moderate stenosis. These results were called by telephone at the time of interpretation on 05/06/2017 at 4:10 am to Dr. Cheral Marker, who verbally acknowledged these results. Electronically Signed   By: Kristine Garbe M.D.   On: 05/06/2017 04:17   Mr Virgel Paling YI Contrast  Result Date: 05/06/2017 CLINICAL DATA:  77 year old male who presented with emergent large vessel occlusion of the left ICA status post endovascular reperfusion earlier today. Personal history of prior whole brain radiation for cancer, but no known brain metastases. EXAM: MRI HEAD WITHOUT CONTRAST MRA HEAD WITHOUT CONTRAST  TECHNIQUE: Multiplanar, multiecho pulse sequences of the brain and surrounding structures were obtained without intravenous contrast. Angiographic images of the head were obtained using MRA technique without contrast. COMPARISON:  Cerebral angiogram, CTA head and neck, and noncontrast head CT earlier today. Prior brain MRI 11/19/2016. FINDINGS: MRI HEAD FINDINGS Brain: Restricted diffusion in the left basal ganglia (series 3, image 32) with T2 and FLAIR hyperintensity. No associated hemorrhage. No significant mass effect. There is a small 9 millimeter area of restricted diffusion in the medial left temporal lobe at the amygdala (series 3, image 20). There are otherwise a small number of tiny scattered foci of restricted diffusion in the left MCA territory (such as series 3, image 40). There is no right hemisphere or posterior circulation restricted diffusion. Patchy and scattered bilateral cerebral white matter T2 and FLAIR hyperintensity elsewhere is chronic and stable. The right deep gray matter nuclei, left thalamus, brainstem, and cerebellum remain normal for age. No midline shift, mass effect, evidence of mass lesion, ventriculomegaly, extra-axial collection or acute intracranial hemorrhage. Cervicomedullary junction and pituitary are within normal limits. Vascular: Major intracranial vascular flow voids are preserved, the left ICA flow void was abnormal in 2018 and appears mildly improved today. Negative visible cervical spine.  Normal bone marrow signal. There is mild leftward gaze deviation. Orbits soft tissues otherwise appear normal. Paranasal Visualized paranasal sinuses and mastoids are stable and well pneumatized. Scalp and face soft tissues appear negative. MRA HEAD FINDINGS Antegrade flow in the posterior circulation with codominant distal vertebral arteries. Mild distal vertebral irregularity with no stenosis. Patent vertebrobasilar junction without stenosis. Dominant appearing AICA origins are  patent. Mild basilar artery irregularity without stenosis. SCA origins are patent. Bilateral PCA origins appear normal. Posterior communicating arteries are diminutive or absent. Bilateral PCA branches are mildly irregular, and there is mild right PCA P2 segment stenosis which is stable from earlier today. Antegrade flow in both ICA siphons. Mild luminal narrowing and irregularity throughout the visible left ICA may reflect residual thrombus or plaque. The left ICA is patent to the left terminus. The left MCA and ACA origins are patent. There is mild to moderate irregularity at the left ACA origin and proximal A1 segment which is stable from earlier today. There is mild irregularity scratched at the left MCA and its branches now are patent. There is mild left M1 irregularity. The left MCA bifurcation is patent with no  branch occlusion identified. Antegrade flow in the right ICA siphon which is only mildly irregular and without stenosis. Normal right ophthalmic artery origin. Normal right ICA terminus, right ACA and MCA origins. The anterior communicating artery is normal. The visible ACA branches are stable, with mild irregularity of the distal left ACA. The right MCA M1 segment is patent with mild irregularity just proximal to the bifurcation which is stable. The right MCA branches are stable and within normal limits. IMPRESSION: 1. Restored patency of the Left ICA, terminus, and Left MCA since earlier today. Mild residual irregularity throughout the left ICA siphon, and in the left M1 segment. Stable mild to moderate irregularity in the left A1 and distal A2 segments. 2. Associated core infarct primarily confined to the Left Basal Ganglia. There is a small 9 mm infarct in the left amygdala, and there are small number of scattered tiny cortical infarcts elsewhere in the Left MCA territory. 3. Mild cytotoxic edema with no associated hemorrhage or mass effect. 4. Mild posterior and right anterior circulation  atherosclerosis, stable from the CTA earlier today. Electronically Signed   By: Genevie Ann M.D.   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 Personal history of prior whole brain radiation for cancer, but no known brain metastases. EXAM: MRI HEAD WITHOUT CONTRAST MRA HEAD WITHOUT CONTRAST TECHNIQUE: Multiplanar, multiecho pulse sequences of the brain and surrounding structures were obtained without intravenous contrast. Angiographic images of the head were obtained using MRA technique without contrast. COMPARISON:  Cerebral angiogram, CTA head and neck, and noncontrast head CT earlier today. Prior brain MRI 11/19/2016. FINDINGS: MRI HEAD FINDINGS Brain: Restricted diffusion in the left basal ganglia (series 3, image 32) with T2 and FLAIR hyperintensity. No associated hemorrhage. No significant mass effect. There is a small 9 millimeter area of restricted diffusion in the medial left temporal lobe at the amygdala (series 3, image 20). There are otherwise a small number of tiny scattered foci of restricted diffusion in the left MCA territory (such as series 3, image 40). There is no right hemisphere or posterior circulation restricted diffusion. Patchy and scattered bilateral cerebral white matter T2 and FLAIR hyperintensity elsewhere is chronic and stable. The right deep gray matter nuclei, left thalamus, brainstem, and cerebellum remain normal for age. No midline shift, mass effect, evidence of mass lesion, ventriculomegaly, extra-axial collection or acute intracranial hemorrhage. Cervicomedullary junction and pituitary are within normal limits. Vascular: Major intracranial vascular flow voids are preserved, the left ICA flow void was abnormal in 2018 and appears mildly improved today. Negative visible cervical spine.  Normal bone marrow  signal. There is mild leftward gaze deviation. Orbits soft tissues otherwise appear normal. Paranasal Visualized paranasal sinuses and mastoids are stable and well pneumatized. Scalp and face soft tissues appear negative. MRA HEAD FINDINGS Antegrade flow in the posterior circulation with codominant distal vertebral arteries. Mild distal vertebral irregularity with no stenosis. Patent vertebrobasilar junction without stenosis. Dominant appearing AICA origins are patent. Mild basilar artery irregularity without stenosis. SCA origins are patent. Bilateral PCA origins appear normal. Posterior communicating arteries are diminutive or absent. Bilateral PCA branches are mildly irregular, and there is mild right PCA P2 segment stenosis which is stable from earlier today. Antegrade flow in both ICA siphons. Mild luminal narrowing and irregularity throughout the visible left ICA may reflect residual thrombus or plaque.  The left ICA is patent to the left terminus. The left MCA and ACA origins are patent. There is mild to moderate irregularity at the left ACA origin and proximal A1 segment which is stable from earlier today. There is mild irregularity scratched at the left MCA and its branches now are patent. There is mild left M1 irregularity. The left MCA bifurcation is patent with no branch occlusion identified. Antegrade flow in the right ICA siphon which is only mildly irregular and without stenosis. Normal right ophthalmic artery origin. Normal right ICA terminus, right ACA and MCA origins. The anterior communicating artery is normal. The visible ACA branches are stable, with mild irregularity of the distal left ACA. The right MCA M1 segment is patent with mild irregularity just proximal to the bifurcation which is stable. The right MCA branches are stable and within normal limits. IMPRESSION: 1. Restored patency of the Left ICA, terminus, and Left MCA since earlier today. Mild residual irregularity throughout the left  ICA siphon, and in the left M1 segment. Stable mild to moderate irregularity in the left A1 and distal A2 segments. 2. Associated core infarct primarily confined to the Left Basal Ganglia. There is a small 9 mm infarct in the left amygdala, and there are small number of scattered tiny cortical infarcts elsewhere in the Left MCA territory. 3. Mild cytotoxic edema with no associated hemorrhage or mass effect. 4. 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 Electronically Signed   By: Genevie Ann M.D.   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 The antibiotic was administered within 1 hour of the procedure 75 MCG NITROGLYCERIN INTRA ARTERIAL 650 mg aspirin via orogastric tube. Intra procedural initiation of Integrilin IV drip for dual anti-platelet therapy. ANESTHESIA/SEDATION: General endotracheal tube anesthesia with anesthesia team CONTRAST:  144 cc IV contrast FLUOROSCOPY TIME:  Fluoroscopy Time: 54 minutes 6 seconds (2,926 mGy). COMPLICATIONS: None TECHNIQUE: Informed written consent was obtained from the patient's  family after a thorough discussion of the procedural risks, benefits and alternatives. Specific risks discussed include: Bleeding, infection, contrast reaction, kidney injury/failure, need for further procedure/surgery, arterial injury or dissection, embolization to new territory, intracranial hemorrhage (10-15% risk), neurologic deterioration, cardiopulmonary collapse, death. All questions were addressed. Maximal Sterile Barrier Technique was utilized including during the procedure including caps, mask, sterile gowns, sterile gloves, sterile drape, hand hygiene and skin antiseptic. A timeout was performed prior to the initiation of the procedure. FINDINGS: Initial angiogram: Left common carotid artery:  Normal course caliber and contour. Left external carotid artery: Patent with antegrade flow. On the initial injection before treatment, there is minimal opacification of the carotid siphon secondary to meningeal branches which appear to be region ating from ascending pharyngeal distribution and potentially deep  meningeal branches from the external carotid artery. No significant filling of the MCA on the initial injection The initial injection does confirmed perfusion of the lenticulostriate vasculature, as well as a patent anterior coronal artery, with minimal filling of temporal lobe. Anterior basal ganglia structures are perfused. Left internal carotid artery: Internal carotid artery is occluded at the origin with a string sign at the posterior aspect of the artery. Left MCA: Initial injection demonstrates no significant filling of the middle cerebral artery. 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 Left ACA: Anterior cerebral artery perfused by the right-sided flow at the initiation. Final angiogram: After treatment of the carotid occlusion and deployment of 8 mm-6 mm x 40 mm carotid stent, there is excellent flow through the cervical carotid to the  intracranial carotid. After mechanical thrombectomy of M1 occlusion, there is modified treatment in cerebral ischemia of 3. Flat panel CT: No complications with small contrast stain of the left basal ganglia. PROCEDURE: Patient was brought to the neurointerventional suite, with the patient identity confirmed, and consent form confirmed. General anesthesia was present for initiation of general endotracheal tube anesthesia. Patient was prepped and draped in the usual sterile fashion. The only venous access was a port catheter with single needle access. Ultrasound survey of the right inguinal region was performed with images stored and sent to PACs. 11 blade scalpel was used to make a small incision. A micropuncture needle was used access the right common femoral artery under ultrasound. With excellent arterial blood flow returned, an .018 micro wire was passed through the needle, observed to enter the abdominal aorta under fluoroscopy. The needle was removed, and a micropuncture sheath was placed over the wire. The inner dilator and wire were removed, and an 035 Bentson wire was advanced under fluoroscopy into the abdominal aorta. The sheath was removed and a standard 5 [place] vascular sheath was placed. The dilator was removed and the sheath was flushed. After the initial common femoral artery access, ultrasound survey of the left inguinal region was performed in order to place arterial monitoring line. Images stored and sent to PACs. Micropuncture needle was used access the left common femoral artery under ultrasound. With excellent arterial blood flow returned, an .018 micro wire was passed through the needle, observed to enter the abdominal aorta under fluoroscopy. The needle was removed, and a micropuncture sheath was placed over the wire. The inner dilator and wire were removed, and an 035 Bentson wire was advanced under fluoroscopy into the abdominal aorta. The sheath was removed and a standard 4 [place]  vascular sheath was placed. The dilator was removed and the sheath was flushed. A 82F JB-1 diagnostic catheter was advanced over the wire through the right common femoral artery access to the proximal descending thoracic aorta. Wire was then removed. Double flush of the catheter was performed. Catheter was then used to select the left common carotid artery. Angiogram was performed. Using roadmap technique, the catheter was advanced over a roadrunner wire into right common carotid artery. Formal angiogram was performed. Catheter was advanced over the roadrunner wire into the external carotid artery. Wire was removed. Exchange length Rosen wire was then passed through the diagnostic catheter to the distal common carotid artery and the diagnostic catheter was removed. The 5 French sheath was removed and exchanged for 8 French 55 centimeter BrightTip sheath. Sheath was flushed and attached to pressurized and heparinized saline bag for constant forward flow. Then an 106 [place],  95 cm Flowgate balloon tip catheter was prepared on the back table with inflation of the balloon with 50/50 concentration of dilute contrast. The balloon catheter was then advanced over the wire, positioned into the distal common carotid artery. Copious back flush was performed and the balloon catheter was attached to heparinized and pressurized saline bag for forward flow. Roadmap was then performed at the carotid bifurcation. Combination of a rapid transit microwire and a synchro soft wire were used to navigate beyond the occlusion at the proximal internal carotid artery. The microcatheter was then navigated into the distal cervical segment, wire was removed, blood was aspirated, and a gentle injection confirmed a luminal position. Exchange length Transcend wire was then placed through the microcatheter into the cervical carotid. Microcatheter was removed. Balloon angioplasty was then performed with a rapid exchange system, using a Viatrac balloon  5 mm x 30 mm. The patient required treatment at this time with atropine dose, as we experienced bradycardia into the 30s. His heart rate recovered after treatment by the anesthesia team. Balloon was removed. Angiogram confirmed flow into the cervical carotid. A coaxial system of an Ace 64 penumbra aspiration catheter and the Trevo pro view 18 catheter was then advanced over the exchange length wire into the cervical carotid. Once the coaxial system was in the cervical carotid, the microwire was removed and exchanged for synchro soft. The synchro soft wire was navigated beyond the occlusion under roadmap guidance, and the aspiration catheter was advanced into the ICA terminus. The balloon guide catheter was then advanced into the mid segment of the cervical carotid artery. The microcatheter and microwire were then carefully advanced through the occluded segment. Microcatheter was then push through the occluded segment and the wire was removed. Blood was then aspirated through the hub of the microcatheter, and a gentle contrast injection was performed confirming intraluminal position. A rotating hemostatic valve was then attached to the back end of the microcatheter, and a pressurized and heparinized saline bag was attached to the catheter. 4 x 40 solitaire device was then selected. Back flush was achieved at the rotating hemostatic valve, and then the device was gently advanced through the microcatheter to the distal end. The retriever was then unsheathed by withdrawing the microcatheter under fluoroscopy. A 5 minutes interval was observed. At this time, ultrasound-guided access of the right common femoral vein was performed for placement of a additional venous access and placement of a 12 French triple-lumen IV catheter. We then proceeded with the mechanical thrombectomy. The balloon at the balloon tip catheter was then inflated under fluoroscopy for proximal flow arrest. Constant aspiration was then performed at  the penumbra aspiration catheter at the carotid terminus as the retriever was gently and slowly withdrawn with fluoroscopic observation. Once the retriever was entirely removed from the system, free aspiration was confirmed at the hub of the aspiration catheter, with free blood return confirmed. The balloon was then deflated, rotating hemostatic valve was reattached, and a control angiogram was performed, confirming restoration of flow. Penumbra catheter was then withdrawn, and the angiogram was performed at the carotid bifurcation. Given the appearance, we elected to place acute carotid stent. The lesion was then again navigated with synchro soft wire and rapid transit. With the microwire microcatheter positioned in the distal cervical carotid, the microwire was removed. The exchange length Transcend wire was then replaced and the microcatheter was removed. Acute carotid stent was then deployed, 8 mm-6 mm x 40 mm length. Before deployment, we initiated Integrilin drip treatment  for dual anti-platelet therapy. Angiogram was performed. Coaxial system of the penumbra aspiration catheter and the pro view 18 catheter were then navigated to the carotid siphon for better angiogram of the cerebral vasculature. Angiogram was performed. Microcatheter, a number aspiration catheter were withdrawn. The balloon guide catheter was positioned in the common carotid artery for final angiogram. All catheter and wires were removed. The sheath at the right common femoral artery access was withdrawn for an angiogram. Eight French Angio-Seal was then deployed for hemostasis. A flat panel CT was then performed.  Patient was then extubated. Estimated blood loss was 50 cc No complications encountered. IMPRESSION: Status post left-sided cerebral angiogram with treatment of left ICA/MCA tandem occlusion, requiring acute left carotid stenting and mechanical thrombectomy of a left M1 occlusion with achievement of mTICI-3 flow, and restoration  of cervical ICA flow. Flat panel CT in the room demonstrates small contrast stain without complicating features, and the patient was extubated. Status post deployment of 8 French Angio-Seal for right common femoral artery hemostasis. Status post ultrasound-guided left common femoral artery access with a 4 French sheath for arterial monitoring, which remained after the case. Status post ultrasound-guided right common femoral vein access for additional IV access, which remained after the case transmitting the Integrilin drip. Signed, Dulcy Fanny. Earleen Newport, DO [facility] Radiology PLAN: Patient will go to the PACU ICU admission Bilateral hips will be straight until the left 4 French arterial monitoring sheath is removed and the right common femoral vein venous access sheath is removed. Routine wound management for the right common femoral artery sheath access. Frequent neurovascular checks. IV saline hydration for 8 hours given the renal insufficiency Blood pressure control management of 120-140 with nicardipine drip ordered The goal will be dual anti-platelet therapy, for now with dose of 650 mg aspirin and the Integrilin drip. Electronically Signed   By: Corrie Mckusick D.O.   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 The antibiotic was administered within 1 hour of the procedure 75 MCG NITROGLYCERIN INTRA ARTERIAL 650 mg aspirin via orogastric tube. Intra procedural initiation of Integrilin IV drip for dual anti-platelet therapy. ANESTHESIA/SEDATION: General endotracheal tube anesthesia with anesthesia team CONTRAST:  144 cc IV contrast FLUOROSCOPY TIME:  Fluoroscopy Time: 54 minutes 6 seconds (2,926 mGy). COMPLICATIONS: None TECHNIQUE: Informed written consent was obtained from the patient's family after a thorough discussion of the procedural risks, benefits and alternatives. Specific risks discussed include: Bleeding, infection, contrast reaction, kidney injury/failure, need for further procedure/surgery, arterial injury or dissection, embolization to new territory, intracranial hemorrhage (10-15% risk), neurologic deterioration, cardiopulmonary collapse, death. All questions were addressed. Maximal Sterile Barrier Technique was utilized including during the procedure including caps, mask, sterile gowns, sterile gloves, sterile drape,  hand hygiene and skin antiseptic. A timeout was performed prior to the initiation of the procedure. FINDINGS: Initial angiogram: Left common carotid artery:  Normal course caliber and contour. Left external carotid artery: Patent with antegrade flow. On the initial injection before treatment, there is minimal opacification of the carotid siphon secondary to meningeal branches which appear to be region ating from ascending pharyngeal distribution and potentially deep meningeal branches from the external carotid artery. No significant filling of the MCA on the initial injection The initial injection does confirmed perfusion of the lenticulostriate vasculature, as well as a patent  anterior coronal artery, with minimal filling of temporal lobe. Anterior basal ganglia structures are perfused. Left internal carotid artery: Internal carotid artery is occluded at the origin with a string sign at the posterior aspect of the artery. Left MCA: Initial injection demonstrates no significant filling of the middle cerebral artery. 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 Left ACA: Anterior cerebral artery perfused by the right-sided flow at the initiation. Final angiogram: After treatment of the carotid occlusion and deployment of 8 mm-6 mm x 40 mm carotid stent, there is excellent flow through the cervical carotid to the intracranial carotid. After mechanical thrombectomy of M1 occlusion, there is modified treatment in cerebral ischemia of 3. Flat panel CT: No complications with small contrast stain of the left basal ganglia. PROCEDURE: Patient was brought to the neurointerventional suite, with the patient identity confirmed, and consent form confirmed. General anesthesia was present for initiation of general endotracheal tube anesthesia. Patient was prepped and draped in the usual sterile fashion. The only venous access was a port catheter with single needle access. Ultrasound survey of the right inguinal region was performed with images stored and sent to PACs. 11 blade scalpel was used to make a small incision. A micropuncture needle was used access the right common femoral artery under ultrasound. With excellent arterial blood flow returned, an .018 micro wire was passed through the needle, observed to enter the abdominal aorta under fluoroscopy. The needle was removed, and a micropuncture sheath was placed over the wire. The inner dilator and wire were removed, and an 035 Bentson wire was advanced under fluoroscopy into the abdominal aorta. The sheath was removed and a standard 5 [place] vascular sheath was placed. The dilator was removed  and the sheath was flushed. After the initial common femoral artery access, ultrasound survey of the left inguinal region was performed in order to place arterial monitoring line. Images stored and sent to PACs. Micropuncture needle was used access the left common femoral artery under ultrasound. With excellent arterial blood flow returned, an .018 micro wire was passed through the needle, observed to enter the abdominal aorta under fluoroscopy. The needle was removed, and a micropuncture sheath was placed over the wire. The inner dilator and wire were removed, and an 035 Bentson wire was advanced under fluoroscopy into the abdominal aorta. The sheath was removed and a standard 4 [place] vascular sheath was placed. The dilator was removed and the sheath was flushed. A 69F JB-1 diagnostic catheter was advanced over the wire through the right common femoral artery access to the proximal descending thoracic aorta. Wire was then removed. Double flush of the catheter was performed. Catheter was then used to select the left common carotid artery. Angiogram was performed. Using roadmap technique, the catheter was advanced over a roadrunner wire into right common carotid artery. Formal angiogram was performed. Catheter was advanced over  the roadrunner wire into the external carotid artery. Wire was removed. Exchange length Rosen wire was then passed through the diagnostic catheter to the distal common carotid artery and the diagnostic catheter was removed. The 5 French sheath was removed and exchanged for 8 French 55 centimeter BrightTip sheath. Sheath was flushed and attached to pressurized and heparinized saline bag for constant forward flow. Then an 8 [place], 95 cm Flowgate balloon tip catheter was prepared on the back table with inflation of the balloon with 50/50 concentration of dilute contrast. The balloon catheter was then advanced over the wire, positioned into the distal common carotid artery. Copious back flush was  performed and the balloon catheter was attached to heparinized and pressurized saline bag for forward flow. Roadmap was then performed at the carotid bifurcation. Combination of a rapid transit microwire and a synchro soft wire were used to navigate beyond the occlusion at the proximal internal carotid artery. The microcatheter was then navigated into the distal cervical segment, wire was removed, blood was aspirated, and a gentle injection confirmed a luminal position. Exchange length Transcend wire was then placed through the microcatheter into the cervical carotid. Microcatheter was removed. Balloon angioplasty was then performed with a rapid exchange system, using a Viatrac balloon 5 mm x 30 mm. The patient required treatment at this time with atropine dose, as we experienced bradycardia into the 30s. His heart rate recovered after treatment by the anesthesia team. Balloon was removed. Angiogram confirmed flow into the cervical carotid. A coaxial system of an Ace 64 penumbra aspiration catheter and the Trevo pro view 18 catheter was then advanced over the exchange length wire into the cervical carotid. Once the coaxial system was in the cervical carotid, the microwire was removed and exchanged for synchro soft. The synchro soft wire was navigated beyond the occlusion under roadmap guidance, and the aspiration catheter was advanced into the ICA terminus. The balloon guide catheter was then advanced into the mid segment of the cervical carotid artery. The microcatheter and microwire were then carefully advanced through the occluded segment. Microcatheter was then push through the occluded segment and the wire was removed. Blood was then aspirated through the hub of the microcatheter, and a gentle contrast injection was performed confirming intraluminal position. A rotating hemostatic valve was then attached to the back end of the microcatheter, and a pressurized and heparinized saline bag was attached to the  catheter. 4 x 40 solitaire device was then selected. Back flush was achieved at the rotating hemostatic valve, and then the device was gently advanced through the microcatheter to the distal end. The retriever was then unsheathed by withdrawing the microcatheter under fluoroscopy. A 5 minutes interval was observed. At this time, ultrasound-guided access of the right common femoral vein was performed for placement of a additional venous access and placement of a 12 French triple-lumen IV catheter. We then proceeded with the mechanical thrombectomy. The balloon at the balloon tip catheter was then inflated under fluoroscopy for proximal flow arrest. Constant aspiration was then performed at the penumbra aspiration catheter at the carotid terminus as the retriever was gently and slowly withdrawn with fluoroscopic observation. Once the retriever was entirely removed from the system, free aspiration was confirmed at the hub of the aspiration catheter, with free blood return confirmed. The balloon was then deflated, rotating hemostatic valve was reattached, and a control angiogram was performed, confirming restoration of flow. Penumbra catheter was then withdrawn, and the angiogram was performed at the carotid bifurcation. Given the  appearance, we elected to place acute carotid stent. The lesion was then again navigated with synchro soft wire and rapid transit. With the microwire microcatheter positioned in the distal cervical carotid, the microwire was removed. The exchange length Transcend wire was then replaced and the microcatheter was removed. Acute carotid stent was then deployed, 8 mm-6 mm x 40 mm length. Before deployment, we initiated Integrilin drip treatment for dual anti-platelet therapy. Angiogram was performed. Coaxial system of the penumbra aspiration catheter and the pro view 18 catheter were then navigated to the carotid siphon for better angiogram of the cerebral vasculature. Angiogram was performed.  Microcatheter, a number aspiration catheter were withdrawn. The balloon guide catheter was positioned in the common carotid artery for final angiogram. All catheter and wires were removed. The sheath at the right common femoral artery access was withdrawn for an angiogram. Eight French Angio-Seal was then deployed for hemostasis. A flat panel CT was then performed.  Patient was then extubated. Estimated blood loss was 50 cc No complications encountered. IMPRESSION: Status post left-sided cerebral angiogram with treatment of left ICA/MCA tandem occlusion, requiring acute left carotid stenting and mechanical thrombectomy of a left M1 occlusion with achievement of mTICI-3 flow, and restoration of cervical ICA flow. Flat panel CT in the room demonstrates small contrast stain without complicating features, and the patient was extubated. Status post deployment of 8 French Angio-Seal for right common femoral artery hemostasis. Status post ultrasound-guided left common femoral artery access with a 4 French sheath for arterial monitoring, which remained after the case. Status post ultrasound-guided right common femoral vein access for additional IV access, which remained after the case transmitting the Integrilin drip. Signed, Dulcy Fanny. Earleen Newport, DO [facility] Radiology PLAN: Patient will go to the PACU ICU admission Bilateral hips will be straight until the left 4 French arterial monitoring sheath is removed and the right common femoral vein venous access sheath is removed. Routine wound management for the right common femoral artery sheath access. Frequent neurovascular checks. IV saline hydration for 8 hours given the renal insufficiency Blood pressure control management of 120-140 with nicardipine drip ordered The goal will be dual anti-platelet therapy, for now with dose of 650 mg aspirin and the Integrilin drip. Electronically Signed   By: Corrie Mckusick D.O.   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 The antibiotic was administered within 1 hour of the procedure 75 MCG NITROGLYCERIN INTRA ARTERIAL 650 mg aspirin via orogastric tube. Intra procedural initiation of Integrilin IV drip for dual anti-platelet therapy. ANESTHESIA/SEDATION: General endotracheal tube anesthesia with anesthesia team CONTRAST:  144 cc IV contrast FLUOROSCOPY TIME:  Fluoroscopy Time: 54 minutes 6 seconds (2,926 mGy). COMPLICATIONS: None TECHNIQUE: Informed written consent was obtained from the patient's family  after a thorough discussion of the procedural risks, benefits and alternatives. Specific risks discussed include: Bleeding, infection, contrast reaction, kidney injury/failure, need for further procedure/surgery, arterial injury or dissection, embolization to new territory, intracranial hemorrhage (10-15% risk), neurologic deterioration, cardiopulmonary collapse, death. All questions were addressed. Maximal Sterile Barrier Technique was utilized including during the procedure including caps, mask, sterile gowns, sterile gloves, sterile drape, hand  hygiene and skin antiseptic. A timeout was performed prior to the initiation of the procedure. FINDINGS: Initial angiogram: Left common carotid artery:  Normal course caliber and contour. Left external carotid artery: Patent with antegrade flow. On the initial injection before treatment, there is minimal opacification of the carotid siphon secondary to meningeal branches which appear to be region ating from ascending pharyngeal distribution and potentially deep meningeal branches from the external carotid artery. No significant filling of the MCA on the initial injection The initial injection does confirmed perfusion of the lenticulostriate vasculature, as well as a patent anterior coronal artery, with minimal filling of temporal lobe. Anterior basal ganglia structures are perfused. Left internal carotid artery: Internal carotid artery is occluded at the origin with a string sign at the posterior aspect of the artery. Left MCA: Initial injection demonstrates no significant filling of the middle cerebral artery. 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 Left ACA: Anterior cerebral artery perfused by the right-sided flow at the initiation. Final angiogram: After treatment of the carotid occlusion and deployment of 8 mm-6 mm x 40 mm carotid stent, there is excellent flow through the cervical carotid to the intracranial carotid. After mechanical thrombectomy of M1 occlusion, there is modified treatment in cerebral ischemia of 3. Flat panel CT: No complications with small contrast stain of the left basal ganglia. PROCEDURE: Patient was brought to the neurointerventional suite, with the patient identity confirmed, and consent form confirmed. General anesthesia was present for initiation of general endotracheal tube anesthesia. Patient was prepped and draped in the usual sterile fashion. The only venous access was a port catheter with single needle access.  Ultrasound survey of the right inguinal region was performed with images stored and sent to PACs. 11 blade scalpel was used to make a small incision. A micropuncture needle was used access the right common femoral artery under ultrasound. With excellent arterial blood flow returned, an .018 micro wire was passed through the needle, observed to enter the abdominal aorta under fluoroscopy. The needle was removed, and a micropuncture sheath was placed over the wire. The inner dilator and wire were removed, and an 035 Bentson wire was advanced under fluoroscopy into the abdominal aorta. The sheath was removed and a standard 5 [place] vascular sheath was placed. The dilator was removed and the sheath was flushed. After the initial common femoral artery access, ultrasound survey of the left inguinal region was performed in order to place arterial monitoring line. Images stored and sent to PACs. Micropuncture needle was used access the left common femoral artery under ultrasound. With excellent arterial blood flow returned, an .018 micro wire was passed through the needle, observed to enter the abdominal aorta under fluoroscopy. The needle was removed, and a micropuncture sheath was placed over the wire. The inner dilator and wire were removed, and an 035 Bentson wire was advanced under fluoroscopy into the abdominal aorta. The sheath was removed and a standard 4 [place] vascular sheath was placed. The dilator was removed and the sheath was flushed. A 79F JB-1 diagnostic catheter was advanced  over the wire through the right common femoral artery access to the proximal descending thoracic aorta. Wire was then removed. Double flush of the catheter was performed. Catheter was then used to select the left common carotid artery. Angiogram was performed. Using roadmap technique, the catheter was advanced over a roadrunner wire into right common carotid artery. Formal angiogram was performed. Catheter was advanced over the  roadrunner wire into the external carotid artery. Wire was removed. Exchange length Rosen wire was then passed through the diagnostic catheter to the distal common carotid artery and the diagnostic catheter was removed. The 5 French sheath was removed and exchanged for 8 French 55 centimeter BrightTip sheath. Sheath was flushed and attached to pressurized and heparinized saline bag for constant forward flow. Then an 8 [place], 95 cm Flowgate balloon tip catheter was prepared on the back table with inflation of the balloon with 50/50 concentration of dilute contrast. The balloon catheter was then advanced over the wire, positioned into the distal common carotid artery. Copious back flush was performed and the balloon catheter was attached to heparinized and pressurized saline bag for forward flow. Roadmap was then performed at the carotid bifurcation. Combination of a rapid transit microwire and a synchro soft wire were used to navigate beyond the occlusion at the proximal internal carotid artery. The microcatheter was then navigated into the distal cervical segment, wire was removed, blood was aspirated, and a gentle injection confirmed a luminal position. Exchange length Transcend wire was then placed through the microcatheter into the cervical carotid. Microcatheter was removed. Balloon angioplasty was then performed with a rapid exchange system, using a Viatrac balloon 5 mm x 30 mm. The patient required treatment at this time with atropine dose, as we experienced bradycardia into the 30s. His heart rate recovered after treatment by the anesthesia team. Balloon was removed. Angiogram confirmed flow into the cervical carotid. A coaxial system of an Ace 64 penumbra aspiration catheter and the Trevo pro view 18 catheter was then advanced over the exchange length wire into the cervical carotid. Once the coaxial system was in the cervical carotid, the microwire was removed and exchanged for synchro soft. The synchro  soft wire was navigated beyond the occlusion under roadmap guidance, and the aspiration catheter was advanced into the ICA terminus. The balloon guide catheter was then advanced into the mid segment of the cervical carotid artery. The microcatheter and microwire were then carefully advanced through the occluded segment. Microcatheter was then push through the occluded segment and the wire was removed. Blood was then aspirated through the hub of the microcatheter, and a gentle contrast injection was performed confirming intraluminal position. A rotating hemostatic valve was then attached to the back end of the microcatheter, and a pressurized and heparinized saline bag was attached to the catheter. 4 x 40 solitaire device was then selected. Back flush was achieved at the rotating hemostatic valve, and then the device was gently advanced through the microcatheter to the distal end. The retriever was then unsheathed by withdrawing the microcatheter under fluoroscopy. A 5 minutes interval was observed. At this time, ultrasound-guided access of the right common femoral vein was performed for placement of a additional venous access and placement of a 12 French triple-lumen IV catheter. We then proceeded with the mechanical thrombectomy. The balloon at the balloon tip catheter was then inflated under fluoroscopy for proximal flow arrest. Constant aspiration was then performed at the penumbra aspiration catheter at the carotid terminus as the retriever was gently and slowly  withdrawn with fluoroscopic observation. Once the retriever was entirely removed from the system, free aspiration was confirmed at the hub of the aspiration catheter, with free blood return confirmed. The balloon was then deflated, rotating hemostatic valve was reattached, and a control angiogram was performed, confirming restoration of flow. Penumbra catheter was then withdrawn, and the angiogram was performed at the carotid bifurcation. Given the  appearance, we elected to place acute carotid stent. The lesion was then again navigated with synchro soft wire and rapid transit. With the microwire microcatheter positioned in the distal cervical carotid, the microwire was removed. The exchange length Transcend wire was then replaced and the microcatheter was removed. Acute carotid stent was then deployed, 8 mm-6 mm x 40 mm length. Before deployment, we initiated Integrilin drip treatment for dual anti-platelet therapy. Angiogram was performed. Coaxial system of the penumbra aspiration catheter and the pro view 18 catheter were then navigated to the carotid siphon for better angiogram of the cerebral vasculature. Angiogram was performed. Microcatheter, a number aspiration catheter were withdrawn. The balloon guide catheter was positioned in the common carotid artery for final angiogram. All catheter and wires were removed. The sheath at the right common femoral artery access was withdrawn for an angiogram. Eight French Angio-Seal was then deployed for hemostasis. A flat panel CT was then performed.  Patient was then extubated. Estimated blood loss was 50 cc No complications encountered. IMPRESSION: Status post left-sided cerebral angiogram with treatment of left ICA/MCA tandem occlusion, requiring acute left carotid stenting and mechanical thrombectomy of a left M1 occlusion with achievement of mTICI-3 flow, and restoration of cervical ICA flow. Flat panel CT in the room demonstrates small contrast stain without complicating features, and the patient was extubated. Status post deployment of 8 French Angio-Seal for right common femoral artery hemostasis. Status post ultrasound-guided left common femoral artery access with a 4 French sheath for arterial monitoring, which remained after the case. Status post ultrasound-guided right common femoral vein access for additional IV access, which remained after the case transmitting the Integrilin drip. Signed, Dulcy Fanny.  Earleen Newport, DO [facility] Radiology PLAN: Patient will go to the PACU ICU admission Bilateral hips will be straight until the left 4 French arterial monitoring sheath is removed and the right common femoral vein venous access sheath is removed. Routine wound management for the right common femoral artery sheath access. Frequent neurovascular checks. IV saline hydration for 8 hours given the renal insufficiency Blood pressure control management of 120-140 with nicardipine drip ordered The goal will be dual anti-platelet therapy, for now with dose of 650 mg aspirin and the Integrilin drip. Electronically Signed   By: Corrie Mckusick D.O.   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 The antibiotic was administered  within 1 hour of the procedure 75 MCG NITROGLYCERIN INTRA ARTERIAL 650 mg aspirin via orogastric tube. Intra procedural initiation of Integrilin IV drip for dual anti-platelet therapy. ANESTHESIA/SEDATION: General endotracheal tube anesthesia with anesthesia team CONTRAST:  144 cc IV contrast FLUOROSCOPY TIME:   Fluoroscopy Time: 54 minutes 6 seconds (2,926 mGy). COMPLICATIONS: None TECHNIQUE: Informed written consent was obtained from the patient's family after a thorough discussion of the procedural risks, benefits and alternatives. Specific risks discussed include: Bleeding, infection, contrast reaction, kidney injury/failure, need for further procedure/surgery, arterial injury or dissection, embolization to new territory, intracranial hemorrhage (10-15% risk), neurologic deterioration, cardiopulmonary collapse, death. All questions were addressed. Maximal Sterile Barrier Technique was utilized including during the procedure including caps, mask, sterile gowns, sterile gloves, sterile drape, hand hygiene and skin antiseptic. A timeout was performed prior to the initiation of the procedure. FINDINGS: Initial angiogram: Left common carotid artery:  Normal course caliber and contour. Left external carotid artery: Patent with antegrade flow. On the initial injection before treatment, there is minimal opacification of the carotid siphon secondary to meningeal branches which appear to be region ating from ascending pharyngeal distribution and potentially deep meningeal branches from the external carotid artery. No significant filling of the MCA on the initial injection The initial injection does confirmed perfusion of the lenticulostriate vasculature, as well as a patent anterior coronal artery, with minimal filling of temporal lobe. Anterior basal ganglia structures are perfused. Left internal carotid artery: Internal carotid artery is occluded at the origin with a string sign at the posterior aspect of the artery. Left MCA: Initial injection demonstrates no significant filling of the middle cerebral artery. 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 Left ACA: Anterior cerebral artery perfused by the right-sided flow at the initiation. Final angiogram: After  treatment of the carotid occlusion and deployment of 8 mm-6 mm x 40 mm carotid stent, there is excellent flow through the cervical carotid to the intracranial carotid. After mechanical thrombectomy of M1 occlusion, there is modified treatment in cerebral ischemia of 3. Flat panel CT: No complications with small contrast stain of the left basal ganglia. PROCEDURE: Patient was brought to the neurointerventional suite, with the patient identity confirmed, and consent form confirmed. General anesthesia was present for initiation of general endotracheal tube anesthesia. Patient was prepped and draped in the usual sterile fashion. The only venous access was a port catheter with single needle access. Ultrasound survey of the right inguinal region was performed with images stored and sent to PACs. 11 blade scalpel was used to make a small incision. A micropuncture needle was used access the right common femoral artery under ultrasound. With excellent arterial blood flow returned, an .018 micro wire was passed through the needle, observed to enter the abdominal aorta under fluoroscopy. The needle was removed, and a micropuncture sheath was placed over the wire. The inner dilator and wire were removed, and an 035 Bentson wire was advanced under fluoroscopy into the abdominal aorta. The sheath was removed and a standard 5 [place] vascular sheath was placed. The dilator was removed and the sheath was flushed. After the initial common femoral artery access, ultrasound survey of the left inguinal region was performed in order to place arterial monitoring line. Images stored and sent to PACs. Micropuncture needle was used access the left common femoral artery under ultrasound. With excellent arterial blood flow returned, an .018 micro wire was passed through the needle, observed to enter the abdominal  aorta under fluoroscopy. The needle was removed, and a micropuncture sheath was placed over the wire. The inner dilator and wire  were removed, and an 035 Bentson wire was advanced under fluoroscopy into the abdominal aorta. The sheath was removed and a standard 4 [place] vascular sheath was placed. The dilator was removed and the sheath was flushed. A 75F JB-1 diagnostic catheter was advanced over the wire through the right common femoral artery access to the proximal descending thoracic aorta. Wire was then removed. Double flush of the catheter was performed. Catheter was then used to select the left common carotid artery. Angiogram was performed. Using roadmap technique, the catheter was advanced over a roadrunner wire into right common carotid artery. Formal angiogram was performed. Catheter was advanced over the roadrunner wire into the external carotid artery. Wire was removed. Exchange length Rosen wire was then passed through the diagnostic catheter to the distal common carotid artery and the diagnostic catheter was removed. The 5 French sheath was removed and exchanged for 8 French 55 centimeter BrightTip sheath. Sheath was flushed and attached to pressurized and heparinized saline bag for constant forward flow. Then an 8 [place], 95 cm Flowgate balloon tip catheter was prepared on the back table with inflation of the balloon with 50/50 concentration of dilute contrast. The balloon catheter was then advanced over the wire, positioned into the distal common carotid artery. Copious back flush was performed and the balloon catheter was attached to heparinized and pressurized saline bag for forward flow. Roadmap was then performed at the carotid bifurcation. Combination of a rapid transit microwire and a synchro soft wire were used to navigate beyond the occlusion at the proximal internal carotid artery. The microcatheter was then navigated into the distal cervical segment, wire was removed, blood was aspirated, and a gentle injection confirmed a luminal position. Exchange length Transcend wire was then placed through the microcatheter into  the cervical carotid. Microcatheter was removed. Balloon angioplasty was then performed with a rapid exchange system, using a Viatrac balloon 5 mm x 30 mm. The patient required treatment at this time with atropine dose, as we experienced bradycardia into the 30s. His heart rate recovered after treatment by the anesthesia team. Balloon was removed. Angiogram confirmed flow into the cervical carotid. A coaxial system of an Ace 64 penumbra aspiration catheter and the Trevo pro view 18 catheter was then advanced over the exchange length wire into the cervical carotid. Once the coaxial system was in the cervical carotid, the microwire was removed and exchanged for synchro soft. The synchro soft wire was navigated beyond the occlusion under roadmap guidance, and the aspiration catheter was advanced into the ICA terminus. The balloon guide catheter was then advanced into the mid segment of the cervical carotid artery. The microcatheter and microwire were then carefully advanced through the occluded segment. Microcatheter was then push through the occluded segment and the wire was removed. Blood was then aspirated through the hub of the microcatheter, and a gentle contrast injection was performed confirming intraluminal position. A rotating hemostatic valve was then attached to the back end of the microcatheter, and a pressurized and heparinized saline bag was attached to the catheter. 4 x 40 solitaire device was then selected. Back flush was achieved at the rotating hemostatic valve, and then the device was gently advanced through the microcatheter to the distal end. The retriever was then unsheathed by withdrawing the microcatheter under fluoroscopy. A 5 minutes interval was observed. At this time, ultrasound-guided access of the right  common femoral vein was performed for placement of a additional venous access and placement of a 12 French triple-lumen IV catheter. We then proceeded with the mechanical thrombectomy. The  balloon at the balloon tip catheter was then inflated under fluoroscopy for proximal flow arrest. Constant aspiration was then performed at the penumbra aspiration catheter at the carotid terminus as the retriever was gently and slowly withdrawn with fluoroscopic observation. Once the retriever was entirely removed from the system, free aspiration was confirmed at the hub of the aspiration catheter, with free blood return confirmed. The balloon was then deflated, rotating hemostatic valve was reattached, and a control angiogram was performed, confirming restoration of flow. Penumbra catheter was then withdrawn, and the angiogram was performed at the carotid bifurcation. Given the appearance, we elected to place acute carotid stent. The lesion was then again navigated with synchro soft wire and rapid transit. With the microwire microcatheter positioned in the distal cervical carotid, the microwire was removed. The exchange length Transcend wire was then replaced and the microcatheter was removed. Acute carotid stent was then deployed, 8 mm-6 mm x 40 mm length. Before deployment, we initiated Integrilin drip treatment for dual anti-platelet therapy. Angiogram was performed. Coaxial system of the penumbra aspiration catheter and the pro view 18 catheter were then navigated to the carotid siphon for better angiogram of the cerebral vasculature. Angiogram was performed. Microcatheter, a number aspiration catheter were withdrawn. The balloon guide catheter was positioned in the common carotid artery for final angiogram. All catheter and wires were removed. The sheath at the right common femoral artery access was withdrawn for an angiogram. Eight French Angio-Seal was then deployed for hemostasis. A flat panel CT was then performed.  Patient was then extubated. Estimated blood loss was 50 cc No complications encountered. IMPRESSION: Status post left-sided cerebral angiogram with treatment of left ICA/MCA tandem occlusion,  requiring acute left carotid stenting and mechanical thrombectomy of a left M1 occlusion with achievement of mTICI-3 flow, and restoration of cervical ICA flow. Flat panel CT in the room demonstrates small contrast stain without complicating features, and the patient was extubated. Status post deployment of 8 French Angio-Seal for right common femoral artery hemostasis. Status post ultrasound-guided left common femoral artery access with a 4 French sheath for arterial monitoring, which remained after the case. Status post ultrasound-guided right common femoral vein access for additional IV access, which remained after the case transmitting the Integrilin drip. Signed, Dulcy Fanny. Earleen Newport, DO [facility] Radiology PLAN: Patient will go to the PACU ICU admission Bilateral hips will be straight until the left 4 French arterial monitoring sheath is removed and the right common femoral vein venous access sheath is removed. Routine wound management for the right common femoral artery sheath access. Frequent neurovascular checks. IV saline hydration for 8 hours given the renal insufficiency Blood pressure control management of 120-140 with nicardipine drip ordered The goal will be dual anti-platelet therapy, for now with dose of 650 mg aspirin and the Integrilin drip. Electronically Signed   By: Corrie Mckusick D.O.   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 The antibiotic was administered within 1 hour of the procedure 75 MCG NITROGLYCERIN INTRA ARTERIAL 650 mg aspirin via orogastric tube. Intra procedural initiation of Integrilin IV drip for dual anti-platelet therapy. ANESTHESIA/SEDATION: General endotracheal tube anesthesia with anesthesia team CONTRAST:  144 cc IV contrast FLUOROSCOPY TIME:  Fluoroscopy Time: 54 minutes 6 seconds (2,926 mGy). COMPLICATIONS: None TECHNIQUE: Informed written consent was obtained from the patient's family after a thorough discussion of the procedural risks, benefits and alternatives. Specific risks discussed include: Bleeding, infection, contrast reaction, kidney injury/failure, need for further procedure/surgery, arterial injury or dissection, embolization to new territory, intracranial hemorrhage (10-15% risk), neurologic deterioration, cardiopulmonary collapse, death. All questions were addressed. Maximal Sterile Barrier Technique was utilized including during the procedure including caps, mask, sterile gowns, sterile gloves, sterile drape, hand hygiene and skin antiseptic. A timeout was performed prior to the initiation of the procedure. FINDINGS: Initial angiogram: Left common carotid artery:  Normal course caliber and contour. Left external carotid artery: Patent with antegrade flow. On the initial injection before treatment, there is minimal opacification of the carotid siphon secondary to meningeal branches which appear to be region ating from ascending pharyngeal distribution and potentially deep meningeal branches from the external carotid artery. No significant filling of the MCA on the initial  injection The initial injection does confirmed perfusion of the lenticulostriate vasculature, as well as a patent anterior coronal artery, with minimal filling of temporal lobe. Anterior basal ganglia structures are perfused. Left internal carotid artery: Internal carotid artery is occluded at the origin with a string sign at the posterior aspect of the artery. Left MCA: Initial injection demonstrates no significant filling of the middle cerebral artery. 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 Left ACA: Anterior cerebral artery perfused by the right-sided flow at the initiation. Final angiogram: After treatment of the carotid occlusion and deployment of 8 mm-6 mm x 40 mm carotid stent, there is excellent flow through the cervical carotid to the intracranial carotid. After mechanical thrombectomy of M1 occlusion, there is modified treatment in cerebral ischemia of 3. Flat panel CT: No complications with small contrast stain of the left basal ganglia. PROCEDURE: Patient was brought to the neurointerventional suite, with the patient identity confirmed, and consent form confirmed. General anesthesia was present for initiation of general endotracheal tube anesthesia. Patient was prepped and draped in the usual sterile fashion. The only venous access was a port catheter with single needle access. Ultrasound survey of the right inguinal region was performed with images stored and sent to PACs. 11 blade scalpel was used to make a small incision. A micropuncture needle was used access the right common femoral artery under ultrasound. With excellent arterial blood flow returned, an .018 micro wire was passed through the needle, observed to enter the abdominal aorta under fluoroscopy. The needle was removed, and a micropuncture sheath was placed over the wire. The inner dilator and wire were removed, and an 035 Bentson wire was advanced under fluoroscopy into the  abdominal aorta. The sheath was removed and a standard 5 [place] vascular sheath was placed. The dilator was removed  and the sheath was flushed. After the initial common femoral artery access, ultrasound survey of the left inguinal region was performed in order to place arterial monitoring line. Images stored and sent to PACs. Micropuncture needle was used access the left common femoral artery under ultrasound. With excellent arterial blood flow returned, an .018 micro wire was passed through the needle, observed to enter the abdominal aorta under fluoroscopy. The needle was removed, and a micropuncture sheath was placed over the wire. The inner dilator and wire were removed, and an 035 Bentson wire was advanced under fluoroscopy into the abdominal aorta. The sheath was removed and a standard 4 [place] vascular sheath was placed. The dilator was removed and the sheath was flushed. A 78F JB-1 diagnostic catheter was advanced over the wire through the right common femoral artery access to the proximal descending thoracic aorta. Wire was then removed. Double flush of the catheter was performed. Catheter was then used to select the left common carotid artery. Angiogram was performed. Using roadmap technique, the catheter was advanced over a roadrunner wire into right common carotid artery. Formal angiogram was performed. Catheter was advanced over the roadrunner wire into the external carotid artery. Wire was removed. Exchange length Rosen wire was then passed through the diagnostic catheter to the distal common carotid artery and the diagnostic catheter was removed. The 5 French sheath was removed and exchanged for 8 French 55 centimeter BrightTip sheath. Sheath was flushed and attached to pressurized and heparinized saline bag for constant forward flow. Then an 8 [place], 95 cm Flowgate balloon tip catheter was prepared on the back table with inflation of the balloon with 50/50 concentration of dilute contrast. The  balloon catheter was then advanced over the wire, positioned into the distal common carotid artery. Copious back flush was performed and the balloon catheter was attached to heparinized and pressurized saline bag for forward flow. Roadmap was then performed at the carotid bifurcation. Combination of a rapid transit microwire and a synchro soft wire were used to navigate beyond the occlusion at the proximal internal carotid artery. The microcatheter was then navigated into the distal cervical segment, wire was removed, blood was aspirated, and a gentle injection confirmed a luminal position. Exchange length Transcend wire was then placed through the microcatheter into the cervical carotid. Microcatheter was removed. Balloon angioplasty was then performed with a rapid exchange system, using a Viatrac balloon 5 mm x 30 mm. The patient required treatment at this time with atropine dose, as we experienced bradycardia into the 30s. His heart rate recovered after treatment by the anesthesia team. Balloon was removed. Angiogram confirmed flow into the cervical carotid. A coaxial system of an Ace 64 penumbra aspiration catheter and the Trevo pro view 18 catheter was then advanced over the exchange length wire into the cervical carotid. Once the coaxial system was in the cervical carotid, the microwire was removed and exchanged for synchro soft. The synchro soft wire was navigated beyond the occlusion under roadmap guidance, and the aspiration catheter was advanced into the ICA terminus. The balloon guide catheter was then advanced into the mid segment of the cervical carotid artery. The microcatheter and microwire were then carefully advanced through the occluded segment. Microcatheter was then push through the occluded segment and the wire was removed. Blood was then aspirated through the hub of the microcatheter, and a gentle contrast injection was performed confirming intraluminal position. A rotating hemostatic valve was  then attached to the back end of the microcatheter, and a  pressurized and heparinized saline bag was attached to the catheter. 4 x 40 solitaire device was then selected. Back flush was achieved at the rotating hemostatic valve, and then the device was gently advanced through the microcatheter to the distal end. The retriever was then unsheathed by withdrawing the microcatheter under fluoroscopy. A 5 minutes interval was observed. At this time, ultrasound-guided access of the right common femoral vein was performed for placement of a additional venous access and placement of a 12 French triple-lumen IV catheter. We then proceeded with the mechanical thrombectomy. The balloon at the balloon tip catheter was then inflated under fluoroscopy for proximal flow arrest. Constant aspiration was then performed at the penumbra aspiration catheter at the carotid terminus as the retriever was gently and slowly withdrawn with fluoroscopic observation. Once the retriever was entirely removed from the system, free aspiration was confirmed at the hub of the aspiration catheter, with free blood return confirmed. The balloon was then deflated, rotating hemostatic valve was reattached, and a control angiogram was performed, confirming restoration of flow. Penumbra catheter was then withdrawn, and the angiogram was performed at the carotid bifurcation. Given the appearance, we elected to place acute carotid stent. The lesion was then again navigated with synchro soft wire and rapid transit. With the microwire microcatheter positioned in the distal cervical carotid, the microwire was removed. The exchange length Transcend wire was then replaced and the microcatheter was removed. Acute carotid stent was then deployed, 8 mm-6 mm x 40 mm length. Before deployment, we initiated Integrilin drip treatment for dual anti-platelet therapy. Angiogram was performed. Coaxial system of the penumbra aspiration catheter and the pro view 18 catheter  were then navigated to the carotid siphon for better angiogram of the cerebral vasculature. Angiogram was performed. Microcatheter, a number aspiration catheter were withdrawn. The balloon guide catheter was positioned in the common carotid artery for final angiogram. All catheter and wires were removed. The sheath at the right common femoral artery access was withdrawn for an angiogram. Eight French Angio-Seal was then deployed for hemostasis. A flat panel CT was then performed.  Patient was then extubated. Estimated blood loss was 50 cc No complications encountered. IMPRESSION: Status post left-sided cerebral angiogram with treatment of left ICA/MCA tandem occlusion, requiring acute left carotid stenting and mechanical thrombectomy of a left M1 occlusion with achievement of mTICI-3 flow, and restoration of cervical ICA flow. Flat panel CT in the room demonstrates small contrast stain without complicating features, and the patient was extubated. Status post deployment of 8 French Angio-Seal for right common femoral artery hemostasis. Status post ultrasound-guided left common femoral artery access with a 4 French sheath for arterial monitoring, which remained after the case. Status post ultrasound-guided right common femoral vein access for additional IV access, which remained after the case transmitting the Integrilin drip. Signed, Dulcy Fanny. Earleen Newport, DO [facility] Radiology PLAN: Patient will go to the PACU ICU admission Bilateral hips will be straight until the left 4 French arterial monitoring sheath is removed and the right common femoral vein venous access sheath is removed. Routine wound management for the right common femoral artery sheath access. Frequent neurovascular checks. IV saline hydration for 8 hours given the renal insufficiency Blood pressure control management of 120-140 with nicardipine drip ordered The goal will be dual anti-platelet therapy, for  now with dose of 650 mg aspirin and the Integrilin drip. Electronically Signed   By: Corrie Mckusick D.O.   On: 05/06/2017 09:24   Dg  Chest Port 1 View  Result Date: 05/08/2017 CLINICAL DATA:  Abnormal chest x-ray. EXAM: PORTABLE CHEST 1 VIEW COMPARISON:  Radiograph of May 06, 2017. FINDINGS: Stable cardiomegaly. Right internal jugular Port-A-Cath is unchanged in position. No pneumothorax is noted. Mild right midlung subsegmental atelectasis is noted. 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 Bony thorax is unremarkable. IMPRESSION: Stable left perihilar and basilar opacities are noted as described above. Mild right midlung subsegmental atelectasis. Electronically Signed   By: Marijo Conception, M.D.   On: 05/08/2017 15:38   Dg Chest Port 1 View  Result Date: 05/06/2017 CLINICAL DATA:  Shortness of breath. Patient status post left MCA thrombectomy and left ICA stent placement today. EXAM: PORTABLE CHEST 1 VIEW COMPARISON:  PA and lateral chest 03/18/2017.  CT chest 03/24/2017. FINDINGS: Interstitial prominence about the patient's left upper lobe pulmonary mass is worse than on the prior plain films of the chest. Mild basilar atelectasis is noted. Heart size is normal. Port-A-Cath is in place. Aortic atherosclerosis is seen. IMPRESSION: Increased interstitial opacities about the patient's left upper lobe pulmonary mass are nonspecific and could be due to tumor spread, postobstructive pneumonitis or less likely hemorrhage. Atherosclerosis. Electronically Signed   By: Inge Rise M.D.   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 The antibiotic was administered within 1 hour of the procedure 75 MCG NITROGLYCERIN INTRA ARTERIAL 650 mg aspirin via orogastric tube. Intra procedural initiation of Integrilin IV drip for dual anti-platelet therapy. ANESTHESIA/SEDATION: General endotracheal tube anesthesia with anesthesia team CONTRAST:  144 cc IV contrast FLUOROSCOPY TIME:  Fluoroscopy Time: 54 minutes 6 seconds (2,926 mGy). COMPLICATIONS: None TECHNIQUE: Informed written consent was obtained from the patient's family after a thorough discussion of the procedural risks, benefits and alternatives. Specific risks discussed include: Bleeding, infection, contrast reaction, kidney injury/failure, need for further procedure/surgery, arterial injury or dissection, embolization to new territory, intracranial hemorrhage (10-15% risk), neurologic deterioration, cardiopulmonary collapse, death. All questions were addressed. Maximal Sterile Barrier Technique was utilized including during the procedure including caps, mask, sterile gowns, sterile gloves, sterile drape, hand hygiene and skin antiseptic. A timeout was performed prior to the initiation of the procedure. FINDINGS: Initial angiogram: Left common carotid artery:  Normal course caliber and contour. Left external carotid artery: Patent with antegrade flow. On the initial injection before  treatment, there is minimal opacification of the carotid siphon secondary to meningeal branches which appear to be region ating from ascending pharyngeal distribution and potentially deep meningeal branches from the external carotid artery. No significant filling of the MCA on the initial injection The initial injection does confirmed perfusion of the lenticulostriate vasculature, as well as a patent anterior coronal  artery, with minimal filling of temporal lobe. Anterior basal ganglia structures are perfused. Left internal carotid artery: Internal carotid artery is occluded at the origin with a string sign at the posterior aspect of the artery. Left MCA: Initial injection demonstrates no significant filling of the middle cerebral artery. 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 Left ACA: Anterior cerebral artery perfused by the right-sided flow at the initiation. Final angiogram: After treatment of the carotid occlusion and deployment of 8 mm-6 mm x 40 mm carotid stent, there is excellent flow through the cervical carotid to the intracranial carotid. After mechanical thrombectomy of M1 occlusion, there is modified treatment in cerebral ischemia of 3. Flat panel CT: No complications with small contrast stain of the left basal ganglia. PROCEDURE: Patient was brought to the neurointerventional suite, with the patient identity confirmed, and consent form confirmed. General anesthesia was present for initiation of general endotracheal tube anesthesia. Patient was prepped and draped in the usual sterile fashion. The only venous access was a port catheter with single needle access. Ultrasound survey of the right inguinal region was performed with images stored and sent to PACs. 11 blade scalpel was used to make a small incision. A micropuncture needle was used access the right common femoral artery under ultrasound. With excellent arterial blood flow returned,  an .018 micro wire was passed through the needle, observed to enter the abdominal aorta under fluoroscopy. The needle was removed, and a micropuncture sheath was placed over the wire. The inner dilator and wire were removed, and an 035 Bentson wire was advanced under fluoroscopy into the abdominal aorta. The sheath was removed and a standard 5 [place] vascular sheath was placed. The dilator was removed and the sheath was flushed. After the initial common femoral artery access, ultrasound survey of the left inguinal region was performed in order to place arterial monitoring line. Images stored and sent to PACs. Micropuncture needle was used access the left common femoral artery under ultrasound. With excellent arterial blood flow returned, an .018 micro wire was passed through the needle, observed to enter the abdominal aorta under fluoroscopy. The needle was removed, and a micropuncture sheath was placed over the wire. The inner dilator and wire were removed, and an 035 Bentson wire was advanced under fluoroscopy into the abdominal aorta. The sheath was removed and a standard 4 [place] vascular sheath was placed. The dilator was removed and the sheath was flushed. A 71F JB-1 diagnostic catheter was advanced over the wire through the right common femoral artery access to the proximal descending thoracic aorta. Wire was then removed. Double flush of the catheter was performed. Catheter was then used to select the left common carotid artery. Angiogram was performed. Using roadmap technique, the catheter was advanced over a roadrunner wire into right common carotid artery. Formal angiogram was performed. Catheter was advanced over the roadrunner wire into the external carotid artery. Wire was removed. Exchange length Rosen wire was then passed through the diagnostic catheter to the distal common carotid artery and the diagnostic catheter was removed. The 5 French sheath was removed and exchanged for 8 French 55 centimeter  BrightTip sheath. Sheath was flushed and attached to pressurized and heparinized saline bag for constant forward flow. Then an 8 [place], 95 cm Flowgate balloon tip catheter was prepared on the back table with inflation of the balloon with 50/50 concentration of dilute contrast. The balloon catheter was then advanced over the wire, positioned into  the distal common carotid artery. Copious back flush was performed and the balloon catheter was attached to heparinized and pressurized saline bag for forward flow. Roadmap was then performed at the carotid bifurcation. Combination of a rapid transit microwire and a synchro soft wire were used to navigate beyond the occlusion at the proximal internal carotid artery. The microcatheter was then navigated into the distal cervical segment, wire was removed, blood was aspirated, and a gentle injection confirmed a luminal position. Exchange length Transcend wire was then placed through the microcatheter into the cervical carotid. Microcatheter was removed. Balloon angioplasty was then performed with a rapid exchange system, using a Viatrac balloon 5 mm x 30 mm. The patient required treatment at this time with atropine dose, as we experienced bradycardia into the 30s. His heart rate recovered after treatment by the anesthesia team. Balloon was removed. Angiogram confirmed flow into the cervical carotid. A coaxial system of an Ace 64 penumbra aspiration catheter and the Trevo pro view 18 catheter was then advanced over the exchange length wire into the cervical carotid. Once the coaxial system was in the cervical carotid, the microwire was removed and exchanged for synchro soft. The synchro soft wire was navigated beyond the occlusion under roadmap guidance, and the aspiration catheter was advanced into the ICA terminus. The balloon guide catheter was then advanced into the mid segment of the cervical carotid artery. The microcatheter and microwire were then carefully advanced  through the occluded segment. Microcatheter was then push through the occluded segment and the wire was removed. Blood was then aspirated through the hub of the microcatheter, and a gentle contrast injection was performed confirming intraluminal position. A rotating hemostatic valve was then attached to the back end of the microcatheter, and a pressurized and heparinized saline bag was attached to the catheter. 4 x 40 solitaire device was then selected. Back flush was achieved at the rotating hemostatic valve, and then the device was gently advanced through the microcatheter to the distal end. The retriever was then unsheathed by withdrawing the microcatheter under fluoroscopy. A 5 minutes interval was observed. At this time, ultrasound-guided access of the right common femoral vein was performed for placement of a additional venous access and placement of a 12 French triple-lumen IV catheter. We then proceeded with the mechanical thrombectomy. The balloon at the balloon tip catheter was then inflated under fluoroscopy for proximal flow arrest. Constant aspiration was then performed at the penumbra aspiration catheter at the carotid terminus as the retriever was gently and slowly withdrawn with fluoroscopic observation. Once the retriever was entirely removed from the system, free aspiration was confirmed at the hub of the aspiration catheter, with free blood return confirmed. The balloon was then deflated, rotating hemostatic valve was reattached, and a control angiogram was performed, confirming restoration of flow. Penumbra catheter was then withdrawn, and the angiogram was performed at the carotid bifurcation. Given the appearance, we elected to place acute carotid stent. The lesion was then again navigated with synchro soft wire and rapid transit. With the microwire microcatheter positioned in the distal cervical carotid, the microwire was removed. The exchange length Transcend wire was then replaced and the  microcatheter was removed. Acute carotid stent was then deployed, 8 mm-6 mm x 40 mm length. Before deployment, we initiated Integrilin drip treatment for dual anti-platelet therapy. Angiogram was performed. Coaxial system of the penumbra aspiration catheter and the pro view 18 catheter were then navigated to the carotid siphon for better angiogram of the cerebral vasculature.  Angiogram was performed. Microcatheter, a number aspiration catheter were withdrawn. The balloon guide catheter was positioned in the common carotid artery for final angiogram. All catheter and wires were removed. The sheath at the right common femoral artery access was withdrawn for an angiogram. Eight French Angio-Seal was then deployed for hemostasis. A flat panel CT was then performed.  Patient was then extubated. Estimated blood loss was 50 cc No complications encountered. IMPRESSION: Status post left-sided cerebral angiogram with treatment of left ICA/MCA tandem occlusion, requiring acute left carotid stenting and mechanical thrombectomy of a left M1 occlusion with achievement of mTICI-3 flow, and restoration of cervical ICA flow. Flat panel CT in the room demonstrates small contrast stain without complicating features, and the patient was extubated. Status post deployment of 8 French Angio-Seal for right common femoral artery hemostasis. Status post ultrasound-guided left common femoral artery access with a 4 French sheath for arterial monitoring, which remained after the case. Status post ultrasound-guided right common femoral vein access for additional IV access, which remained after the case transmitting the Integrilin drip. Signed, Dulcy Fanny. Earleen Newport, DO [facility] Radiology PLAN: Patient will go to the PACU ICU admission Bilateral hips will be straight until the left 4 [place] arterial monitoring sheath is removed and the right common femoral vein venous access sheath is removed. Routine  wound management for the right common femoral artery sheath access. Frequent neurovascular checks. IV saline hydration for 8 hours given the renal insufficiency Blood pressure control management of 120-140 with nicardipine drip ordered The goal will be dual anti-platelet therapy, for now with dose of 650 mg aspirin and the Integrilin drip. Electronically Signed   By: Corrie Mckusick D.O.   On: 05/06/2017 09:24   Ct Head Code Stroke Wo Contrast  Result Date: 05/06/2017 CLINICAL DATA:  Code stroke. 77 y/o M; right-sided weakness and numbness with slurred speech. EXAM: CT HEAD WITHOUT CONTRAST TECHNIQUE: Contiguous axial images were obtained from the base of the skull through the vertex without intravenous contrast. COMPARISON:  11/19/2016 MRI of the head. FINDINGS: Brain: No evidence of acute infarction, hemorrhage, hydrocephalus, extra-axial collection or mass lesion/mass effect. Stable moderate chronic microvascular ischemic changes and parenchymal volume loss of the brain. Vascular: Calcific atherosclerosis of carotid siphons. Dense left M1 (series 5, image 30). Skull: Normal. Negative for fracture or focal lesion. Sinuses/Orbits: No acute finding. Other: None. ASPECTS [facility] Stroke Program Early CT Score) - Ganglionic level infarction (caudate, lentiform nuclei, internal capsule, insula, M1-M3 cortex): 7 - Supraganglionic infarction (M4-M6 cortex): 3 Total score (0-10 with 10 being normal): 10 IMPRESSION: 1. No acute stroke, hemorrhage, or mass effect identified. 2. Dense left M1, possible thrombus. 3. ASPECTS is 10 4. Stable chronic microvascular ischemic changes and parenchymal volume loss of the brain given differences in technique. These results were communicated to Dr. Cheral Marker at 3:41 amon 2/28/2019by text page via the [facility] messaging system. Electronically Signed   By: Kristine Garbe M.D.   On: 05/06/2017 03:43    Lab Data:  CBC: Recent Labs  Lab 05/06/17 0312  05/07/17 0500  05/08/17 0615 05/08/17 1444 05/09/17 1406 05/10/17 0425  WBC 8.0  --  8.3 6.9  --  5.7 4.9  NEUTROABS 6.3  --   --   --   --   --   --   HGB 12.6*   < > 10.7* 11.2* 11.8* 10.5* 10.3*  HCT 38.4*   < > 33.4* 34.9* 36.5* 32.1* 32.1*  MCV 95.5  --  96.3 95.9  --  95.0 95.8  PLT 185  --  176 164  --  184 181   < > = values in this interval not displayed.   Basic Metabolic Panel: Recent Labs  Lab 05/06/17 0312 05/06/17 0329 05/07/17 0500 05/08/17 0615 05/09/17 1406 05/10/17 0425  NA 135 137 139 139 137 139  K 4.3 4.3 4.3 4.4 4.0 4.1  CL 101 103 109 110 108 110  CO2 22  --  22 20* 18* 18*  GLUCOSE 383* 366* 203* 173* 236* 141*  BUN 40* 37* 30* 33* 32* 28*  CREATININE 1.98* 1.80* 1.88* 1.89* 1.81* 1.70*  CALCIUM 9.1  --  8.2* 8.4* 8.1* 8.1*   GFR: Estimated Creatinine Clearance: 43.5 mL/min (A) (by C-G formula based on SCr of 1.7 mg/dL (H)). Liver Function Tests: Recent Labs  Lab 05/06/17 0312  AST 16  ALT 13*  ALKPHOS 100  BILITOT 0.4  PROT 6.6  ALBUMIN 3.1*   No results for input(s): LIPASE, AMYLASE in the last 168 hours. No results for input(s): AMMONIA in the last 168 hours. Coagulation Profile: Recent Labs  Lab 05/06/17 0312  INR 0.92   Cardiac Enzymes: No results for input(s): CKTOTAL, CKMB, CKMBINDEX, TROPONINI in the last 168 hours. BNP (last 3 results) No results for input(s): PROBNP in the last 8760 hours. HbA1C: No results for input(s): HGBA1C in the last 72 hours. CBG: Recent Labs  Lab 05/09/17 0851 05/09/17 1111 05/09/17 1718 05/09/17 2118 05/10/17 0624  GLUCAP 177* 195* 120* 102* 139*   Lipid Profile: No results for input(s): CHOL, HDL, LDLCALC, TRIG, CHOLHDL, LDLDIRECT in the last 72 hours. Thyroid Function Tests: No results for input(s): TSH, T4TOTAL, FREET4, T3FREE, THYROIDAB in the last 72 hours. Anemia Panel: No results for input(s): VITAMINB12, FOLATE, FERRITIN, TIBC, IRON, RETICCTPCT in the last 72 hours. Urine analysis:     Component Value Date/Time   COLORURINE YELLOW 05/08/2017 [place] 05/08/2017 1634   LABSPEC 1.017 05/08/2017 1634   PHURINE 5.0 05/08/2017 1634   GLUCOSEU NEGATIVE 05/08/2017 1634   HGBUR LARGE (A) 05/08/2017 1634   BILIRUBINUR NEGATIVE 05/08/2017 1634   KETONESUR NEGATIVE 05/08/2017 1634   PROTEINUR 30 (A) 05/08/2017 1634   NITRITE NEGATIVE 05/08/2017 1634   LEUKOCYTESUR NEGATIVE 05/08/2017 1634     Kadeshia Kasparian M.D. [facility] 05/10/2017, 9:08 AM  Pager: [phone] Between 7am to 7pm - call Pager - [phone]  After 7pm go to [email or web address] - password [email or web address]  Call night coverage person covering after 7pm

## 2017-05-11 ENCOUNTER — Inpatient Hospital Stay (HOSPITAL_COMMUNITY): Payer: Medicare Other

## 2017-05-11 ENCOUNTER — Encounter (HOSPITAL_COMMUNITY): Payer: Self-pay | Admitting: Nurse Practitioner

## 2017-05-11 ENCOUNTER — Inpatient Hospital Stay (HOSPITAL_COMMUNITY)
Admission: RE | Admit: 2017-05-11 | Discharge: 2017-05-17 | DRG: 056 | Disposition: A | Discharge status: Other Acute Inpatient Hospital | Payer: Medicare Other | Source: Intra-hospital | Attending: Physical Medicine & Rehabilitation | Admitting: Physical Medicine & Rehabilitation

## 2017-05-11 DIAGNOSIS — R0602 Shortness of breath: Secondary | ICD-10-CM

## 2017-05-11 DIAGNOSIS — Z7984 Long term (current) use of oral hypoglycemic drugs: Secondary | ICD-10-CM

## 2017-05-11 DIAGNOSIS — Z881 Allergy status to other antibiotic agents status: Secondary | ICD-10-CM | POA: Diagnosis not present

## 2017-05-11 DIAGNOSIS — I10 Essential (primary) hypertension: Secondary | ICD-10-CM | POA: Diagnosis present

## 2017-05-11 DIAGNOSIS — Z801 Family history of malignant neoplasm of trachea, bronchus and lung: Secondary | ICD-10-CM | POA: Diagnosis not present

## 2017-05-11 DIAGNOSIS — R05 Cough: Secondary | ICD-10-CM

## 2017-05-11 DIAGNOSIS — D62 Acute posthemorrhagic anemia: Secondary | ICD-10-CM

## 2017-05-11 DIAGNOSIS — Z7951 Long term (current) use of inhaled steroids: Secondary | ICD-10-CM

## 2017-05-11 DIAGNOSIS — R4189 Other symptoms and signs involving cognitive functions and awareness: Secondary | ICD-10-CM | POA: Diagnosis present

## 2017-05-11 DIAGNOSIS — Z91013 Allergy to seafood: Secondary | ICD-10-CM | POA: Diagnosis not present

## 2017-05-11 DIAGNOSIS — K219 Gastro-esophageal reflux disease without esophagitis: Secondary | ICD-10-CM | POA: Diagnosis present

## 2017-05-11 DIAGNOSIS — R0989 Other specified symptoms and signs involving the circulatory and respiratory systems: Secondary | ICD-10-CM | POA: Diagnosis not present

## 2017-05-11 DIAGNOSIS — Z888 Allergy status to other drugs, medicaments and biological substances status: Secondary | ICD-10-CM

## 2017-05-11 DIAGNOSIS — I950 Idiopathic hypotension: Secondary | ICD-10-CM | POA: Diagnosis present

## 2017-05-11 DIAGNOSIS — I69392 Facial weakness following cerebral infarction: Secondary | ICD-10-CM

## 2017-05-11 DIAGNOSIS — I951 Orthostatic hypotension: Secondary | ICD-10-CM | POA: Diagnosis present

## 2017-05-11 DIAGNOSIS — E7800 Pure hypercholesterolemia, unspecified: Secondary | ICD-10-CM | POA: Diagnosis present

## 2017-05-11 DIAGNOSIS — Z7982 Long term (current) use of aspirin: Secondary | ICD-10-CM

## 2017-05-11 DIAGNOSIS — I69322 Dysarthria following cerebral infarction: Secondary | ICD-10-CM | POA: Diagnosis not present

## 2017-05-11 DIAGNOSIS — Z95828 Presence of other vascular implants and grafts: Secondary | ICD-10-CM

## 2017-05-11 DIAGNOSIS — I69351 Hemiplegia and hemiparesis following cerebral infarction affecting right dominant side: Secondary | ICD-10-CM | POA: Diagnosis present

## 2017-05-11 DIAGNOSIS — I69320 Aphasia following cerebral infarction: Secondary | ICD-10-CM

## 2017-05-11 DIAGNOSIS — J449 Chronic obstructive pulmonary disease, unspecified: Secondary | ICD-10-CM | POA: Diagnosis present

## 2017-05-11 DIAGNOSIS — I619 Nontraumatic intracerebral hemorrhage, unspecified: Secondary | ICD-10-CM

## 2017-05-11 DIAGNOSIS — Z91041 Radiographic dye allergy status: Secondary | ICD-10-CM

## 2017-05-11 DIAGNOSIS — Z9221 Personal history of antineoplastic chemotherapy: Secondary | ICD-10-CM

## 2017-05-11 DIAGNOSIS — I610 Nontraumatic intracerebral hemorrhage in hemisphere, subcortical: Secondary | ICD-10-CM | POA: Diagnosis not present

## 2017-05-11 DIAGNOSIS — Z87891 Personal history of nicotine dependence: Secondary | ICD-10-CM | POA: Diagnosis not present

## 2017-05-11 DIAGNOSIS — Z79899 Other long term (current) drug therapy: Secondary | ICD-10-CM

## 2017-05-11 DIAGNOSIS — C3412 Malignant neoplasm of upper lobe, left bronchus or lung: Secondary | ICD-10-CM | POA: Diagnosis present

## 2017-05-11 DIAGNOSIS — R509 Fever, unspecified: Secondary | ICD-10-CM

## 2017-05-11 DIAGNOSIS — I639 Cerebral infarction, unspecified: Secondary | ICD-10-CM | POA: Diagnosis not present

## 2017-05-11 DIAGNOSIS — R059 Cough, unspecified: Secondary | ICD-10-CM

## 2017-05-11 DIAGNOSIS — Z923 Personal history of irradiation: Secondary | ICD-10-CM

## 2017-05-11 DIAGNOSIS — Z880 Allergy status to penicillin: Secondary | ICD-10-CM | POA: Diagnosis not present

## 2017-05-11 DIAGNOSIS — G8191 Hemiplegia, unspecified affecting right dominant side: Secondary | ICD-10-CM | POA: Diagnosis not present

## 2017-05-11 DIAGNOSIS — E119 Type 2 diabetes mellitus without complications: Secondary | ICD-10-CM | POA: Diagnosis present

## 2017-05-11 DIAGNOSIS — J302 Other seasonal allergic rhinitis: Secondary | ICD-10-CM | POA: Diagnosis present

## 2017-05-11 DIAGNOSIS — R7309 Other abnormal glucose: Secondary | ICD-10-CM | POA: Diagnosis not present

## 2017-05-11 DIAGNOSIS — N189 Chronic kidney disease, unspecified: Secondary | ICD-10-CM

## 2017-05-11 LAB — CBC
HCT: 31 % — ABNORMAL LOW (ref 39.0–52.0)
Hemoglobin: 10 g/dL — ABNORMAL LOW (ref 13.0–17.0)
MCH: 30.5 pg (ref 26.0–34.0)
MCHC: 32.3 g/dL (ref 30.0–36.0)
MCV: 94.5 fL (ref 78.0–100.0)
Platelets: 192 10*3/uL (ref 150–400)
RBC: 3.28 MIL/uL — ABNORMAL LOW (ref 4.22–5.81)
RDW: 13 % (ref 11.5–15.5)
WBC: 5.4 10*3/uL (ref 4.0–10.5)

## 2017-05-11 LAB — BASIC METABOLIC PANEL
ANION GAP: 10 (ref 5–15)
BUN: 27 mg/dL — ABNORMAL HIGH (ref 6–20)
CALCIUM: 8.3 mg/dL — AB (ref 8.9–10.3)
CHLORIDE: 111 mmol/L (ref 101–111)
CO2: 18 mmol/L — AB (ref 22–32)
Creatinine, Ser: 1.76 mg/dL — ABNORMAL HIGH (ref 0.61–1.24)
GFR calc Af Amer: 41 mL/min — ABNORMAL LOW (ref >60)
GFR calc non Af Amer: 36 mL/min — ABNORMAL LOW (ref >60)
GLUCOSE: 155 mg/dL — AB (ref 65–99)
Potassium: 4.2 mmol/L (ref 3.5–5.1)
Sodium: 139 mmol/L (ref 135–145)

## 2017-05-11 LAB — GLUCOSE, CAPILLARY
GLUCOSE-CAPILLARY: 187 mg/dL — AB (ref 65–99)
GLUCOSE-CAPILLARY: 116 mg/dL — AB (ref 65–99)
Glucose-Capillary: 212 mg/dL — ABNORMAL HIGH (ref 65–99)
Glucose-Capillary: 222 mg/dL — ABNORMAL HIGH (ref 65–99)

## 2017-05-11 MED ORDER — ACYCLOVIR 5 % EX OINT
TOPICAL_OINTMENT | Freq: Three times a day (TID) | CUTANEOUS | Status: DC
  Administered 2017-05-12 – 2017-05-16 (×13): via TOPICAL
  Filled 2017-05-11 (×2): qty 15

## 2017-05-11 MED ORDER — INSULIN ASPART 100 UNIT/ML ~~LOC~~ SOLN: 0.0000 [IU] | Freq: Every day | SUBCUTANEOUS | 11 refills | Status: DC

## 2017-05-11 MED ORDER — GLIPIZIDE ER 10 MG PO TB24
10.0000 mg | ORAL_TABLET | Freq: Every day | ORAL | Status: DC
  Administered 2017-05-12 – 2017-05-17 (×6): 10 mg via ORAL
  Filled 2017-05-11 (×6): qty 1

## 2017-05-11 MED ORDER — INSULIN ASPART 100 UNIT/ML ~~LOC~~ SOLN: 4.0000 [IU] | Freq: Three times a day (TID) | SUBCUTANEOUS | 11 refills | Status: DC

## 2017-05-11 MED ORDER — HEPARIN SOD (PORK) LOCK FLUSH 100 UNIT/ML IV SOLN
500.0000 [IU] | INTRAVENOUS | Status: AC | PRN
  Administered 2017-05-11: 500 [IU]

## 2017-05-11 MED ORDER — INSULIN ASPART 100 UNIT/ML ~~LOC~~ SOLN: 0.0000 [IU] | Freq: Three times a day (TID) | SUBCUTANEOUS | 11 refills | Status: DC

## 2017-05-11 MED ORDER — FLEET ENEMA 7-19 GM/118ML RE ENEM: 1.0000 | ENEMA | Freq: Once | RECTAL | Status: DC | PRN

## 2017-05-11 MED ORDER — ALBUTEROL SULFATE (2.5 MG/3ML) 0.083% IN NEBU: 3.0000 mL | INHALATION_SOLUTION | Freq: Four times a day (QID) | RESPIRATORY_TRACT | Status: DC | PRN

## 2017-05-11 MED ORDER — PROCHLORPERAZINE EDISYLATE 5 MG/ML IJ SOLN: 5.0000 mg | Freq: Four times a day (QID) | INTRAMUSCULAR | Status: DC | PRN

## 2017-05-11 MED ORDER — CLOPIDOGREL BISULFATE 75 MG PO TABS: 75.0000 mg | ORAL_TABLET | Freq: Every day | ORAL | Status: DC

## 2017-05-11 MED ORDER — LINAGLIPTIN 5 MG PO TABS
5.0000 mg | ORAL_TABLET | Freq: Every day | ORAL | Status: DC
  Administered 2017-05-12 – 2017-05-17 (×6): 5 mg via ORAL
  Filled 2017-05-11 (×6): qty 1

## 2017-05-11 MED ORDER — TRAZODONE HCL 50 MG PO TABS: 25.0000 mg | ORAL_TABLET | Freq: Every evening | ORAL | Status: DC | PRN

## 2017-05-11 MED ORDER — ASPIRIN EC 325 MG PO TBEC
325.0000 mg | DELAYED_RELEASE_TABLET | Freq: Every day | ORAL | Status: DC
  Administered 2017-05-12 – 2017-05-17 (×6): 325 mg via ORAL
  Filled 2017-05-11 (×6): qty 1

## 2017-05-11 MED ORDER — INSULIN ASPART 100 UNIT/ML ~~LOC~~ SOLN
0.0000 [IU] | Freq: Every day | SUBCUTANEOUS | Status: DC
  Administered 2017-05-11: 2 [IU] via SUBCUTANEOUS

## 2017-05-11 MED ORDER — POLYETHYLENE GLYCOL 3350 17 G PO PACK
17.0000 g | PACK | Freq: Every day | ORAL | Status: DC | PRN
  Administered 2017-05-16: 17 g via ORAL
  Filled 2017-05-11: qty 1

## 2017-05-11 MED ORDER — ORAL CARE MOUTH RINSE
15.0000 mL | Freq: Two times a day (BID) | OROMUCOSAL | Status: DC
  Administered 2017-05-11 – 2017-05-17 (×9): 15 mL via OROMUCOSAL

## 2017-05-11 MED ORDER — ONDANSETRON HCL 4 MG/2ML IJ SOLN: 4.0000 mg | Freq: Four times a day (QID) | INTRAMUSCULAR | 0 refills | Status: DC | PRN

## 2017-05-11 MED ORDER — GUAIFENESIN-DM 100-10 MG/5ML PO SYRP
5.0000 mL | ORAL_SOLUTION | Freq: Four times a day (QID) | ORAL | Status: DC | PRN
  Administered 2017-05-16: 10 mL via ORAL
  Filled 2017-05-11: qty 10

## 2017-05-11 MED ORDER — ALUM & MAG HYDROXIDE-SIMETH 200-200-20 MG/5ML PO SUSP: 30.0000 mL | ORAL | Status: DC | PRN

## 2017-05-11 MED ORDER — SODIUM CHLORIDE 0.9% FLUSH: 10.0000 mL | INTRAVENOUS | Status: DC | PRN

## 2017-05-11 MED ORDER — HEPARIN SOD (PORK) LOCK FLUSH 100 UNIT/ML IV SOLN: 500.0000 [IU] | INTRAVENOUS | Status: DC | PRN

## 2017-05-11 MED ORDER — FLUTICASONE FUROATE-VILANTEROL 200-25 MCG/INH IN AEPB
1.0000 | INHALATION_SPRAY | Freq: Every day | RESPIRATORY_TRACT | Status: DC
  Administered 2017-05-12 – 2017-05-17 (×5): 1 via RESPIRATORY_TRACT
  Filled 2017-05-11: qty 28

## 2017-05-11 MED ORDER — SODIUM CHLORIDE 0.9% FLUSH: 10.0000 mL | Freq: Two times a day (BID) | INTRAVENOUS | Status: DC

## 2017-05-11 MED ORDER — ACETAMINOPHEN 325 MG PO TABS
325.0000 mg | ORAL_TABLET | ORAL | Status: DC | PRN
  Administered 2017-05-16 – 2017-05-17 (×3): 650 mg via ORAL
  Filled 2017-05-11 (×3): qty 2

## 2017-05-11 MED ORDER — CHLORHEXIDINE GLUCONATE CLOTH 2 % EX PADS: 6.0000 | MEDICATED_PAD | Freq: Every day | CUTANEOUS | Status: DC

## 2017-05-11 MED ORDER — INSULIN ASPART 100 UNIT/ML ~~LOC~~ SOLN
4.0000 [IU] | Freq: Three times a day (TID) | SUBCUTANEOUS | Status: DC
  Administered 2017-05-11 – 2017-05-17 (×17): 4 [IU] via SUBCUTANEOUS

## 2017-05-11 MED ORDER — PROCHLORPERAZINE MALEATE 5 MG PO TABS: 5.0000 mg | ORAL_TABLET | Freq: Four times a day (QID) | ORAL | Status: DC | PRN

## 2017-05-11 MED ORDER — SODIUM CHLORIDE 0.9 % IV SOLN: INTRAVENOUS | Status: DC

## 2017-05-11 MED ORDER — MIDODRINE HCL 5 MG PO TABS
5.0000 mg | ORAL_TABLET | Freq: Three times a day (TID) | ORAL | Status: DC
  Administered 2017-05-11: 5 mg via ORAL
  Filled 2017-05-11 (×2): qty 1

## 2017-05-11 MED ORDER — NYSTATIN 100000 UNIT/ML MT SUSP: 5.0000 mL | Freq: Four times a day (QID) | OROMUCOSAL | 0 refills | Status: DC

## 2017-05-11 MED ORDER — NYSTATIN 100000 UNIT/ML MT SUSP
5.0000 mL | Freq: Four times a day (QID) | OROMUCOSAL | Status: DC
  Administered 2017-05-11 – 2017-05-17 (×21): 500000 [IU] via OROMUCOSAL
  Filled 2017-05-11 (×22): qty 5

## 2017-05-11 MED ORDER — INSULIN ASPART 100 UNIT/ML ~~LOC~~ SOLN
0.0000 [IU] | Freq: Three times a day (TID) | SUBCUTANEOUS | Status: DC
  Administered 2017-05-12: 8 [IU] via SUBCUTANEOUS
  Administered 2017-05-12: 3 [IU] via SUBCUTANEOUS
  Administered 2017-05-13 – 2017-05-16 (×6): 2 [IU] via SUBCUTANEOUS
  Administered 2017-05-16: 3 [IU] via SUBCUTANEOUS
  Administered 2017-05-17 (×2): 2 [IU] via SUBCUTANEOUS

## 2017-05-11 MED ORDER — FLUDROCORTISONE ACETATE 0.1 MG PO TABS: 0.1000 mg | ORAL_TABLET | Freq: Every day | ORAL | Status: DC

## 2017-05-11 MED ORDER — PROCHLORPERAZINE 25 MG RE SUPP: 12.5000 mg | Freq: Four times a day (QID) | RECTAL | Status: DC | PRN

## 2017-05-11 MED ORDER — HEPARIN SODIUM (PORCINE) 5000 UNIT/ML IJ SOLN
5000.0000 [IU] | Freq: Three times a day (TID) | INTRAMUSCULAR | Status: DC
  Administered 2017-05-11 – 2017-05-17 (×18): 5000 [IU] via SUBCUTANEOUS
  Filled 2017-05-11 (×18): qty 1

## 2017-05-11 MED ORDER — CLOPIDOGREL BISULFATE 75 MG PO TABS
75.0000 mg | ORAL_TABLET | Freq: Every day | ORAL | Status: DC
  Administered 2017-05-12 – 2017-05-17 (×6): 75 mg via ORAL
  Filled 2017-05-11 (×6): qty 1

## 2017-05-11 MED ORDER — FLUDROCORTISONE ACETATE 0.1 MG PO TABS
0.1000 mg | ORAL_TABLET | Freq: Every day | ORAL | Status: DC
  Administered 2017-05-12 – 2017-05-17 (×6): 0.1 mg via ORAL
  Filled 2017-05-11 (×6): qty 1

## 2017-05-11 MED ORDER — HEPARIN SODIUM (PORCINE) 5000 UNIT/ML IJ SOLN: 5000.0000 [IU] | Freq: Three times a day (TID) | INTRAMUSCULAR | Status: DC

## 2017-05-11 MED ORDER — MIDODRINE HCL 5 MG PO TABS: 5.0000 mg | ORAL_TABLET | Freq: Three times a day (TID) | ORAL | Status: DC

## 2017-05-11 MED ORDER — DIPHENHYDRAMINE HCL 12.5 MG/5ML PO ELIX: 12.5000 mg | ORAL_SOLUTION | Freq: Four times a day (QID) | ORAL | Status: DC | PRN

## 2017-05-11 MED ORDER — BISACODYL 10 MG RE SUPP: 10.0000 mg | Freq: Every day | RECTAL | Status: DC | PRN

## 2017-05-11 MED ORDER — ACYCLOVIR 5 % EX OINT: TOPICAL_OINTMENT | Freq: Three times a day (TID) | CUTANEOUS | Status: DC

## 2017-05-11 MED ORDER — SENNOSIDES-DOCUSATE SODIUM 8.6-50 MG PO TABS: 1.0000 | ORAL_TABLET | Freq: Every evening | ORAL | Status: DC | PRN

## 2017-05-11 NOTE — H&P (Signed)
Physical Medicine and Rehabilitation Admission H&P    Chief Complaint  Patient presents with  . Stroke with functional deficits.     HPI:  Edwin Jones is an 77 y.o. 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 He did not receive TPA secondary to concerns of potential brain metastasis.  CTA brain done revealing demonstrating left M1 occlusion and patient underwent mechanical thrombectomy and ICA stenting. Follow up MRI/MRA brain showed restored patency of L-ICA and L-MCA, mild cytotoxic edema and left basal ganglia infarct, small infarct in left amygdala and small number of scatterd tiny cortical infarcts in L-MCA territory. He had problems with hypotension after ICA stenting was placed on Neo-Synephrine drip as well as albumin and stress dose steroids. He was oral midodrine and florinef for BP support.     Dr. Erlinda Hong and Dr. Rogue Bussing felt that stroke was not related to hypercoagulable state but likely cardioembolic source therefore TEE done 3/4 and showed dilated left atrium negative for thrombus, NO PFO/ASD with very late bubbles in left atrium consistent with pulmonic re-circulation and no source of cardiac or aortic emboli. He was started on ASA/Plavix for embolic stroke and loop recorder recommended to rule out A fib.  Blood sugars have been poorly controlled and low dose Lantus recommended for tighter control.  Patient with resultant left sided weakness with shuffling gait with need for rest breaks, cognitive deficits and difficulty with ADL tasks.  CIR recommended due to functional deficits.        Review of Systems  Constitutional: Negative for chills and fever.  HENT: Negative for hearing loss and tinnitus.   Eyes: Positive for blurred vision. Negative for double vision.  Respiratory: Positive for shortness of breath. Negative for cough.     Cardiovascular: Negative for chest pain, claudication and leg swelling.  Gastrointestinal: Negative for constipation, heartburn and nausea.  Genitourinary: Negative for dysuria.  Musculoskeletal: Negative for back pain and myalgias.  Skin: Negative for itching and rash.  Neurological: Positive for speech change, focal weakness and weakness. Negative for dizziness and headaches.  Psychiatric/Behavioral: The patient is nervous/anxious and has insomnia.      Past Medical History:  Diagnosis Date  . Cancer ([place])   . COPD (chronic obstructive pulmonary disease) ([place])   . Diabetes mellitus without complication ([place])   . GERD without esophagitis   . Hepatitis C virus    history of hep C treated with Harvoni  . Hypercholesterolemia   . Hypertension   . Seasonal allergies     Past Surgical History:  Procedure Laterality Date  . COLONOSCOPY     approximately 11 years ago  . FLEXIBLE BRONCHOSCOPY N/A 11/05/2016   Procedure: FLEXIBLE BRONCHOSCOPY;  Surgeon: Wilhelmina Mcardle, MD;  Location: [facility] ORS;  Service: Pulmonary;  Laterality: N/A;  . IR FLUORO GUIDE CV LINE RIGHT  05/06/2017  . IR INTRAVSC STENT CERV CAROTID W/O EMB-PROT MOD SED INC ANGIO  05/06/2017  . IR PERCUTANEOUS ART THROMBECTOMY/INFUSION INTRACRANIAL INC DIAG ANGIO  05/06/2017  . IR US GUIDE VASC ACCESS LEFT  05/06/2017  . IR US GUIDE VASC ACCESS RIGHT  05/06/2017  . IR US GUIDE VASC ACCESS RIGHT  05/06/2017  . PORTA CATH INSERTION N/A 11/23/2016   Procedure: Glori Luis Cath Insertion;  Surgeon: Algernon Huxley, MD;  Location: [facility];  Service: Cardiovascular;  Laterality: N/A;  . RADIOLOGY WITH ANESTHESIA N/A 05/06/2017   Procedure: IR WITH ANESTHESIA;  Surgeon: Radiologist, Medication, MD;  Location: [facility];  Service: Radiology;  Laterality: N/A;  . TEE WITHOUT CARDIOVERSION N/A 05/10/2017   Procedure: TRANSESOPHAGEAL ECHOCARDIOGRAM (TEE);  Surgeon: Sanda Klein, MD;  Location: [facility] ENDOSCOPY;  Service: Cardiovascular;   Laterality: N/A;  . TONSILLECTOMY      Family History  Problem Relation Age of Onset  . Skin cancer Mother   . Ovarian cancer Sister        sister was diagnosed as a teenager  . Throat cancer Brother 24       brother #1  . Lung cancer Sister 74  . Throat cancer Brother 68       brother #2  . Skin cancer Sister     Social History:  Married. Independent PTA.  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  He reports that he quit smoking  03/2004. His smoking use included cigarettes. He has a 50.00 pack-year smoking history. He has never used smokeless tobacco. He reports that he does not drink alcohol or use drugs.    Allergies  Allergen Reactions  . Penicillin G Anaphylaxis    Other reaction(s): Other (See Comments) Couldn't breath among other things Has patient had a PCN reaction causing immediate rash, facial/tongue/throat swelling, SOB or lightheadedness with hypotension: Yes Has patient had a PCN reaction causing severe rash involving mucus membranes or skin necrosis: No Has patient had a PCN reaction that required hospitalization: Yes Has patient had a PCN reaction occurring within the last 10 years: No If all of the above answers are "NO", then may proceed with Ce  . Shellfish-Derived Products Anaphylaxis  . Statins     Other reaction(s): Other (See Comments) Muscle spasms - can't walk - couldn't turn over in bed  . Erythromycin Itching    All mycins  . Tamsulosin     Other reaction(s): Dizziness  . Tuberculin Ppd     Other reaction(s): Other (See Comments) False test   . Iodinated Diagnostic Agents Hives    Medications Prior to Admission  Medication Sig Dispense Refill  . albuterol (PROVENTIL HFA;VENTOLIN HFA) 108 (90 Base) MCG/ACT inhaler Inhale 2 puffs into the lungs every 6 (six) hours as needed for wheezing or shortness of breath. 1 Inhaler 2  . APPLE CIDER VINEGAR PO Take 450 mg by mouth at bedtime.     Marland Kitchen aspirin EC 81 MG tablet Take 81 mg by mouth at  bedtime.     . cetirizine (ZYRTEC) 10 MG tablet Take 10 mg by mouth daily.    . cholecalciferol (VITAMIN D) 1000 units tablet Take 1,000 Units by mouth daily.    . Coenzyme Q10 100 MG capsule Take 100 mg by mouth daily.     . Flaxseed, Linseed, (FLAXSEED OIL) 1000 MG CAPS Take 1,000 mg by mouth at bedtime.     . fluticasone (FLONASE) 50 MCG/ACT nasal spray Place 1 spray into both nostrils daily as needed for allergies or rhinitis.    . Fluticasone-Salmeterol (ADVAIR DISKUS) 500-50 MCG/DOSE AEPB Inhale 1 puff into the lungs 2 (two) times daily. 1 each 3  . glipiZIDE (GLUCOTROL) 10 MG tablet Take 10 mg by mouth daily. Time release capsule    . glucagon (GLUCAGON EMERGENCY) 1 MG injection Inject 1 mg into the muscle once as needed. When blood glucose drops less than 50; and if patient has difficulty drinking. 1  each 12  . GNP GARLIC EXTRACT PO Take 4,665 mg by mouth daily.     . hydrochlorothiazide (HYDRODIURIL) 25 MG tablet Take 25 mg by mouth daily.    Marland Kitchen linagliptin (TRADJENTA) 5 MG TABS tablet Take 5 mg by mouth daily.    Marland Kitchen lisinopril (PRINIVIL,ZESTRIL) 10 MG tablet Take 10 mg by mouth daily.    . methocarbamol (ROBAXIN) 500 MG tablet Take 1 tablet (500 mg total) by mouth every 8 (eight) hours as needed for muscle spasms. 60 tablet 0  . Multiple Vitamin (MULTIVITAMIN WITH MINERALS) TABS tablet Take 1 tablet by mouth daily.    Marland Kitchen omeprazole (PRILOSEC) 20 MG capsule Take 20 mg by mouth every Monday, Wednesday, and Friday.     . simethicone (MYLICON) 80 MG chewable tablet Chew 80 mg by mouth as needed for flatulence.     . vitamin C (ASCORBIC ACID) 250 MG tablet Take 250 mg by mouth daily.    Marland Kitchen zinc sulfate 220 (50 Zn) MG capsule Take 220 mg by mouth daily.    Marland Kitchen dexamethasone (DECADRON) 4 MG tablet Take 1 tablet (4 mg total) by mouth daily. Starting on the first day of radiation (Patient not taking: Reported on 05/06/2017) 25 tablet 0  . lidocaine-prilocaine (EMLA) cream Apply 1 application topically  as needed. (Patient not taking: Reported on 05/06/2017) 30 g 3  . ondansetron (ZOFRAN) 8 MG tablet Take 1 tablet (8 mg total) by mouth every 8 (eight) hours as needed for nausea or vomiting (start 3 days; after chemo). (Patient not taking: Reported on 03/25/2017) 40 tablet 1  . prochlorperazine (COMPAZINE) 10 MG tablet Take 1 tablet (10 mg total) by mouth every 6 (six) hours as needed for nausea or vomiting. (Patient not taking: Reported on 03/25/2017) 40 tablet 1  . ZINC-VITAMIN C MT Take 1 tablet daily by mouth.      Drug Regimen Review  Drug regimen was reviewed and remains appropriate with no significant issues identified  Home: Home Living Family/patient expects to be discharged to:: Private residence Living Arrangements: Spouse/significant other Available Help at Discharge: Family, Available 24 hours/day Type of Home: House Home Access: Stairs to enter [employer] of Steps: 2 Entrance Stairs-Rails: Right, Left Home Layout: One level Bathroom Shower/Tub: Multimedia programmer: Standard Home Equipment: None   Functional History: Prior Function Level of Independence: Independent Comments: does all his ADL's and drives  Functional Status:  Mobility: Bed Mobility Overal bed mobility: Needs Assistance Bed Mobility: Supine to Sit Supine to sit: Min guard, HOB elevated General bed mobility comments: Min Guard for safety; no assist needed.  Transfers Overall transfer level: Needs assistance Equipment used: None Transfers: Sit to/from Stand Sit to Stand: Mod assist General transfer comment: Assist to power to standing with pt reaching for UE assist on IV pole, slow to rise with cues for extension throughout. Transferred to chair post ambulation. Ambulation/Gait Ambulation/Gait assistance: Min assist, +2 safety/equipment Ambulation Distance (Feet): 120 Feet Assistive device: None Gait Pattern/deviations: Step-to pattern, Step-through pattern, Decreased step  length - right, Decreased step length - left, Decreased stance time - left, Decreased stride length, Shuffle, Narrow base of support General Gait Details: Slow, shuffling like gait pattern with decreased step length bilaterally; decreased stance time LLE and 2 standing rest breaks due to fatigue. Min A for balance, slight right lateral lean.  Gait velocity: decreased    ADL: ADL Overall ADL's : Needs assistance/impaired Grooming: Wash/dry hands, Set up, Sitting Grooming Details (indicate cue type and  reason): Pt using hand sanitizer after toileting. required cues for sequencing and intiating task Upper Body Bathing: Minimal assistance, Sitting Lower Body Bathing: Minimal assistance, Sit to/from stand Upper Body Dressing : Minimal assistance, Sitting Lower Body Dressing: Minimal assistance, Sit to/from stand Lower Body Dressing Details (indicate cue type and reason): Pt able to reach forward and adjust socks at EOB. Min A for standing balance Toilet Transfer: Minimal assistance, +2 for safety/equipment, Ambulation, RW(Simulated to recliner) Toileting- Clothing Manipulation and Hygiene: Minimal assistance, Sit to/from stand Toileting - Clothing Manipulation Details (indicate cue type and reason): Pt performing toilet hygiene after bowl incontinence in bed. Pt requiring Min A to make sure he was completely clean.  Functional mobility during ADLs: Min guard, Rolling walker General ADL Comments: Pt demonstrating decreased fucntional performance. Pt with poor cognition, balance, and fucnitonal use of RUE.  Cognition: Cognition Overall Cognitive Status: Impaired/Different from baseline Arousal/Alertness: Awake/alert Orientation Level: Oriented X4 Attention: Sustained Sustained Attention: Appears intact Cognition Arousal/Alertness: Awake/alert Behavior During Therapy: Flat affect Overall Cognitive Status: Impaired/Different from baseline Area of Impairment: Orientation, Memory, Problem  solving, Following commands Orientation Level: Disoriented to, Place, Time("[place]" "November") Current Attention Level: Sustained Memory: Decreased short-term memory Following Commands: Follows multi-step commands inconsistently Safety/Judgement: Decreased awareness of safety, Decreased awareness of deficits Awareness: Emergent Problem Solving: Slow processing, Requires verbal cues General Comments: Pt not able to state his or wife's bday without contextual cues. Flat affect throughout with some appropriate smiling. Increased time to perform tasks. Per wife, more verbal today but sitill confused not close to baseline.  Difficult to assess due to: Impaired communication   Blood pressure 133/64, pulse 62, temperature 99.5 F (37.5 C), temperature source Oral, resp. rate 18, height _0  (1.905 m), weight 96.2 kg (212 lb 1.3 oz), SpO2 98 %. Physical Exam  Nursing note and vitals reviewed. Constitutional: He is oriented to person, place, and time. He appears well-developed and well-nourished. He appears lethargic. He is easily aroused. No distress.  HENT:  Head: Normocephalic and atraumatic.  Mouth/Throat: Oropharynx is clear and moist.  Cardiovascular: Normal rate and regular rhythm. Exam reveals no friction rub.  No murmur heard. Respiratory: Accessory muscle usage (occasionally with speech) present. No respiratory distress. He has no wheezes. He has rhonchi in the right upper field and the left upper field. He has no rales.  Increased WOB with speech--question anxiety.   GI: Soft. Bowel sounds are normal. He exhibits no distension. There is no tenderness.  Neurological: He is oriented to person, place, and time and easily aroused. He appears lethargic.  Lethargic and slow to initiate--he did improve as exam progressed. Right facial weakness with wet voice occasionally. Voice soft and whispery at times. Dysarthric speech. Expressive language deficits--was unable to state age or date of  birth with perseverative speech.  He was able to state name of [place], city as well as his children. He was able to follow simple one and two step commands with minimal cues. MMT: Right upper extremity   4 out of 5 proximal distal.  Right lower extremity 4 out of 5 proximal to distal. LUE and LLE 5/5.  Senses pain and light touch in both right arm and left arm as well as right leg and left leg  Skin: Skin is warm and dry. He is not diaphoretic.  Psychiatric: His affect is blunt. His speech is delayed and slurred. He is slowed and withdrawn. Cognition and memory are impaired. He is inattentive.    Results for orders placed  or performed during the hospital encounter of 05/06/17 (from the past 48 hour(s))  CBC     Status: Abnormal   Collection Time: 05/09/17  2:06 PM  Result Value Ref Range   WBC 5.7 4.0 - 10.5 K/uL   RBC 3.38 (L) 4.22 - 5.81 MIL/uL   Hemoglobin 10.5 (L) 13.0 - 17.0 g/dL   HCT 32.1 (L) 39.0 - 52.0 %   MCV 95.0 78.0 - 100.0 fL   MCH 31.1 26.0 - 34.0 pg   MCHC 32.7 30.0 - 36.0 g/dL   RDW 13.1 11.5 - 15.5 %   Platelets 184 150 - 400 K/uL    Comment: Performed at [facility] [address], [place], [place] [address]  Basic metabolic panel     Status: Abnormal   Collection Time: 05/09/17  2:06 PM  Result Value Ref Range   Sodium 137 135 - 145 mmol/L   Potassium 4.0 3.5 - 5.1 mmol/L   Chloride 108 101 - 111 mmol/L   CO2 18 (L) 22 - 32 mmol/L   Glucose, Bld 236 (H) 65 - 99 mg/dL   BUN 32 (H) 6 - 20 mg/dL   Creatinine, Ser 1.81 (H) 0.61 - 1.24 mg/dL   Calcium 8.1 (L) 8.9 - 10.3 mg/dL   GFR calc non Af Amer 34 (L) >60 mL/min   GFR calc Af Amer 40 (L) >60 mL/min    Comment: (NOTE) The eGFR has been calculated using the CKD EPI equation. This calculation has not been validated in all clinical situations. eGFR's persistently <60 mL/min signify possible Chronic Kidney Disease.    Anion gap 11 5 - 15    Comment: Performed at [facility] [address]  [place], [place] [address]  ACTH stimulation, 3 time points     Status: None   Collection Time: 05/09/17  2:06 PM  Result Value Ref Range   Cortisol, Base 11.9 ug/dL    Comment: NO NORMAL RANGE ESTABLISHED FOR THIS TEST   Cortisol, 30 Min 12.1 ug/dL   Cortisol, 60 Min 11.5 ug/dL    Comment: Performed at [facility] [address] [place], [place] [address]  Glucose, capillary     Status: Abnormal   Collection Time: 05/09/17  5:18 PM  Result Value Ref Range   Glucose-Capillary 120 (H) 65 - 99 mg/dL  Glucose, capillary     Status: Abnormal   Collection Time: 05/09/17  9:18 PM  Result Value Ref Range   Glucose-Capillary 102 (H) 65 - 99 mg/dL   Comment 1 Notify RN    Comment 2 Document in Chart   CBC     Status: Abnormal   Collection Time: 05/10/17  4:25 AM  Result Value Ref Range   WBC 4.9 4.0 - 10.5 K/uL   RBC 3.35 (L) 4.22 - 5.81 MIL/uL   Hemoglobin 10.3 (L) 13.0 - 17.0 g/dL   HCT 32.1 (L) 39.0 - 52.0 %   MCV 95.8 78.0 - 100.0 fL   MCH 30.7 26.0 - 34.0 pg   MCHC 32.1 30.0 - 36.0 g/dL   RDW 13.3 11.5 - 15.5 %   Platelets 181 150 - 400 K/uL    Comment: Performed at [facility], [place] [address], [place], [place] [address]  Basic metabolic panel     Status: Abnormal   Collection Time: 05/10/17  4:25 AM  Result Value Ref Range   Sodium 139 135 - 145 mmol/L   Potassium 4.1  3.5 - 5.1 mmol/L   Chloride 110 101 - 111 mmol/L   CO2 18 (L) 22 - 32 mmol/L   Glucose, Bld 141 (H) 65 - 99 mg/dL   BUN 28 (H) 6 - 20 mg/dL   Creatinine, Ser 1.70 (H) 0.61 - 1.24 mg/dL   Calcium 8.1 (L) 8.9 - 10.3 mg/dL   GFR calc non Af Amer 37 (L) >60 mL/min   GFR calc Af Amer 43 (L) >60 mL/min    Comment: (NOTE) The eGFR has been calculated using the CKD EPI equation. This calculation has not been validated in all clinical situations. eGFR's persistently <60 mL/min signify possible Chronic Kidney Disease.    Anion gap 11 5 - 15    Comment: Performed at [facility] [address], [place], [place] [address]  Glucose, capillary     Status: Abnormal   Collection Time: 05/10/17  6:24 AM  Result Value Ref Range   Glucose-Capillary 139 (H) 65 - 99 mg/dL   Comment 1 Notify RN    Comment 2 Document in Chart   Glucose, capillary     Status: Abnormal   Collection Time: 05/10/17 11:32 AM  Result Value Ref Range   Glucose-Capillary 163 (H) 65 - 99 mg/dL  Glucose, capillary     Status: Abnormal   Collection Time: 05/10/17  4:01 PM  Result Value Ref Range   Glucose-Capillary 120 (H) 65 - 99 mg/dL  Glucose, capillary     Status: Abnormal   Collection Time: 05/10/17 10:00 PM  Result Value Ref Range   Glucose-Capillary 204 (H) 65 - 99 mg/dL   Comment 1 Notify RN    Comment 2 Document in Chart   CBC     Status: Abnormal   Collection Time: 05/11/17  2:27 AM  Result Value Ref Range   WBC 5.4 4.0 - 10.5 K/uL   RBC 3.28 (L) 4.22 - 5.81 MIL/uL   Hemoglobin 10.0 (L) 13.0 - 17.0 g/dL   HCT 31.0 (L) 39.0 - 52.0 %   MCV 94.5 78.0 - 100.0 fL   MCH 30.5 26.0 - 34.0 pg   MCHC 32.3 30.0 - 36.0 g/dL   RDW 13.0 11.5 - 15.5 %   Platelets 192 150 - 400 K/uL    Comment: Performed at [facility], [place]. [address] [place], [place] [address]  Basic metabolic panel     Status: Abnormal   Collection Time: 05/11/17  2:27 AM  Result Value Ref Range   Sodium 139 135 - 145 mmol/L   Potassium 4.2 3.5 - 5.1 mmol/L   Chloride 111 101 - 111 mmol/L   CO2 18 (L) 22 - 32 mmol/L   Glucose, Bld 155 (H) 65 - 99 mg/dL   BUN 27 (H) 6 - 20 mg/dL   Creatinine, Ser 1.76 (H) 0.61 - 1.24 mg/dL   Calcium 8.3 (L) 8.9 - 10.3 mg/dL   GFR calc non Af Amer 36 (L) >60 mL/min   GFR calc Af Amer 41 (L) >60 mL/min    Comment: (NOTE) The eGFR has been calculated using the CKD EPI equation. This calculation has not been validated in all clinical situations. eGFR's persistently <60 mL/min signify possible Chronic Kidney Disease.    Anion gap 10 5 - 15    Comment: Performed at [facility] [address], [place], [place] [address]  Glucose, capillary     Status: Abnormal   Collection Time: 05/11/17  6:09 AM  Result Value Ref Range   Glucose-Capillary 212 (H) 65 - 99 mg/dL   Comment 1 Notify RN    Comment 2 Document in Chart   Glucose, capillary     Status: Abnormal   Collection Time: 05/11/17 11:35 AM  Result Value Ref Range   Glucose-Capillary 187 (H) 65 - 99 mg/dL   No results found.     Medical Problem List and Plan: 1.  Functional and cognitive deficits secondary to left MCA infarct   -admit to inpatient rehab 2.  DVT Prophylaxis/Anticoagulation: Pharmaceutical: Heparin. Order dopplers to rule out DVT due to PFO 3. Pain Management: tylenol prn 4. Mood: Appears depressed. Team to provide ego support. LCSW to follow for evaluation and support.  5. Neuropsych: This patient is not capable of making decisions on his own behalf. 6. Skin/Wound Care: routine pressure relief measures.  7. Fluids/Electrolytes/Nutrition: Monitor I/O. Intake 100% at lunch. 8. Hypotension: Will continue to monitor BP every  4 hours for 24 hours per wife's request and concerns. Will  Continue IVF, midodrine and florinef for BP support.  9. Lethargy with increased right facial weakness (per wife):  Time spent educating wife on fluctuating symptoms with fatigue as well as  did receive fentanyl/ Versed yesterday afternoon for TEE.  Did appear to be improving slightly later in the afternoon today. 10. COPD: Continue Breo. Albuterol nebs prn. Will check CXR to rule out overload due to ongoing IVF.  11. T2DM: Hgb A1c-  Off steriods.  Will monitor BS ac/hs. Continue glipizide and trajenta with SSI for elevated BS.  12. L-ICA stent: On ASA and plavix.     Post Admission Physician Evaluation: 1. Functional deficits secondary  to left MCA infarct. 2. Patient is admitted to receive collaborative, interdisciplinary care between the physiatrist, rehab nursing staff, and therapy team. 3. Patient's level of medical  complexity and substantial therapy needs in context of that medical necessity cannot be provided at a lesser intensity of care such as a SNF. 4. Patient has experienced substantial functional loss from his/her baseline which was documented above under the "Functional History" and "Functional Status" headings.  Judging by the patient's diagnosis, physical exam, and functional history, the patient has potential for functional progress which will result in measurable gains while on inpatient rehab.  These gains will be of substantial and practical use upon discharge  in facilitating mobility and self-care at the household level. 5. Physiatrist will provide 24 hour management of medical needs as well as oversight of the therapy plan/treatment and provide guidance as appropriate regarding the interaction of the two. 6. The Preadmission Screening has been reviewed and patient status is unchanged unless otherwise stated above. 7. 24 hour rehab nursing will assist with bladder management, bowel management, safety, skin/wound care, disease management, medication administration, pain management and patient education  and help integrate therapy concepts, techniques,education, etc. 8. PT will assess and treat for/with: Lower extremity strength, range of motion, stamina, balance, functional mobility, safety, adaptive techniques and equipment, NMR, family ed.   Goals are: supervision. 9. OT will assess and treat for/with: ADL's, functional mobility, safety, upper extremity strength, adaptive techniques and equipment, NMR, family ed.   Goals are: supervision. Therapy may proceed with showering this patient. 10. SLP will assess and treat for/with: cognition, language, communication.  Goals are: mod I. 11. Case Management and Social Worker will assess and treat for psychological issues and discharge planning. 12. Team conference will be held weekly to assess progress toward goals and to determine  barriers to  discharge. 13. Patient will receive at least 3 hours of therapy per day at least 5 days per week. 14. ELOS: 12-16 days       15. Prognosis:  excellent     Meredith Staggers, MD, [facility] 05/11/2017  Bary Leriche, PA-C 05/11/2017

## 2017-05-11 NOTE — Care Management Note (Signed)
Case Management Note  Patient Details  Name: Edwin Jones MRN: [id number] Date of Birth: 04-May-1940  Subjective/Objective:                    Action/Plan: Pt discharging to CIR. CM signing off.   Expected Discharge Date:                  Expected Discharge Plan:  [place]  In-House Referral:     Discharge planning Services  CM Consult  Post Acute Care Choice:    Choice offered to:     DME Arranged:    DME Agency:     HH Arranged:    [place] Agency:     Status of Service:  Completed, signed off  If discussed at [employer] of Stay Meetings, dates discussed:    Additional Comments:  Pollie Friar, RN 05/11/2017, 12:59 PM

## 2017-05-11 NOTE — PMR Pre-admission (Signed)
PMR Admission Coordinator Pre-Admission Assessment  Patient: Edwin Jones is an 77 y.o., male MRN: [id number] DOB: [birth year] Height: 6\' 3"  (190.5 cm) Weight: 96.2 kg (212 lb 1.3 oz)              Insurance Information HMO:     PPO:      PCP:      IPA:      80/20:      OTHER:  PRIMARY: Medicare A & B      Policy#: [id number]      Subscriber: Self CM Name:       Phone#:      Fax#:  Pre-Cert#: Eligible       Employer: Retired Benefits:  Phone #: Verified online     Name: [employer]  Eff. Date: A:01/07/05 B:09/06/09     Deduct: $1364      Out of Pocket Max: N/A      Life Max: N/A CIR: 100%      SNF: 100% days 1-20; 80% days 21-100 Outpatient: 80%     Co-Pay: 20% Home Health: 100%      Co-Pay: $0 DME: 80%     Co-Pay: 20% Providers: Patient's Choice   SECONDARY: None        Medicaid Application Date:       Case Manager:  Disability Application Date:       Case Worker:   Emergency Contact Information Contact Information    Name Relation Home Work Mobile   Lemarr,[name] Spouse [phone]   Otho Ket   [phone]     Current Medical History  Patient Admitting Diagnosis: 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  He did not receive TPA secondary to concerns of potential brain metastasis.  Patient was evaluated by neurology, MRI demonstrating left M1 occlusion and patient underwent mechanical thrombectomy and ICA stenting.  Conversations between oncology and neurology suggested that the stroke was not related to hypercoagulable state but likely cardioembolic source and therefore TEE was ordered after transthoracic  echocardiogram did not demonstrate embolic source.  Patient was on aspirin 81 mg prior to admission, placed on Integrilin but this was switched to Plavix. Patient had problems with hypotension after ICA stenting was placed on Neo-Synephrine drip and then switched to oral midodrine.  Patient is a diabetic and he has been started on Tradjenta as well as Glucotrol. Physical therapy evaluation 05/07/2017, mod assist with transfers mod assist 15 feet with rolling walker. Speech therapy evaluation 05/06/2017, regular diet with thin liquids recommended small sips with upright seating.  Language evaluation by speech therapy on 05/06/2017 demonstrated moderate expressive aphasia with poor ability to self-correct. TEE completed 05/10/17 with findings of dilated left atrium, normal appendage velocities, no thrombus. No PFO/ASD - very late saline bubbles are seen in the left atrium, consistent with pulmonic re-circulation. Almost no plaque in the aorta; Normal TEE, no cardiac or aortic embolic source.  Patient declined loop recorder placement with plans for an outpatient, 30-day cardiac monitor to be placed after IP Rehab.  Patient admitted to [facility] comprehensive inpatient rehabilitation program 05/11/17.       NIH Total: 4    Past Medical History  Past Medical History:  Diagnosis Date  .  Cancer ([place])   . COPD (chronic obstructive pulmonary disease) ([place])   . Diabetes mellitus without complication ([place])   . GERD without esophagitis   . Hepatitis C virus    history of hep C treated with Harvoni  . Hypercholesterolemia   . Hypertension   . Seasonal allergies     Family History  family history includes Lung cancer (age of onset: 69) in his sister; Ovarian cancer in his sister; Skin cancer in his mother and sister; Throat cancer (age of onset: 21) in his brother; Throat cancer (age of onset: 27) in his brother.  Prior Rehab/Hospitalizations:  Has the patient had major surgery during 100 days prior to admission?  No  Current Medications   Current Facility-Administered Medications:  .   stroke: mapping our early stages of recovery book, , Does not apply, Once, Kerney Elbe, MD .  0.9 %  sodium chloride infusion, , Intravenous, Continuous, Rinehuls, Early Chars, PA-C, Last Rate: 50 mL/hr at 05/11/17 0202 .  acetaminophen (TYLENOL) tablet 650 mg, 650 mg, Oral, Q4H PRN, 650 mg at 05/10/17 2146 **OR** acetaminophen (TYLENOL) solution 650 mg, 650 mg, Per Tube, Q4H PRN **OR** acetaminophen (TYLENOL) suppository 650 mg, 650 mg, Rectal, Q4H PRN, Kerney Elbe, MD .  acyclovir ointment (ZOVIRAX) 5 %, , Topical, TID, Rinehuls, David L, PA-C .  albuterol (PROVENTIL) (2.5 MG/3ML) 0.083% nebulizer solution 3 mL, 3 mL, Inhalation, Q6H PRN, Kerney Elbe, MD .  aspirin tablet 325 mg, 325 mg, Oral, Daily, 325 mg at 05/11/17 0812 **OR** [DISCONTINUED] aspirin chewable tablet 324 mg, 324 mg, Per Tube, Daily, Corrie Mckusick, DO .  Chlorhexidine Gluconate Cloth 2 % PADS 6 each, 6 each, Topical, Daily, Rosalin Hawking, MD, 6 each at 05/11/17 0813 .  clopidogrel (PLAVIX) tablet 75 mg, 75 mg, Oral, Daily, Tyrone Apple, RPH, 75 mg at 05/11/17 1610 .  fludrocortisone (FLORINEF) tablet 0.1 mg, 0.1 mg, Oral, Daily, Garvin Fila, MD, 0.1 mg at 05/11/17 9604 .  fluticasone furoate-vilanterol (BREO ELLIPTA) 200-25 MCG/INH 1 puff, 1 puff, Inhalation, Daily, Kerney Elbe, MD, 1 puff at 05/11/17 0919 .  glipiZIDE (GLUCOTROL) tablet 10 mg, 10 mg, Oral, Daily, Rosalin Hawking, MD, 10 mg at 05/11/17 5409 .  heparin injection 5,000 Units, 5,000 Units, Subcutaneous, Q8H, Rosalin Hawking, MD, 5,000 Units at 05/11/17 [phone] .  insulin aspart (novoLOG) injection 0-15 Units, 0-15 Units, Subcutaneous, TID WC, Kerney Elbe, MD, 5 Units at 05/11/17 [phone] .  insulin aspart (novoLOG) injection 0-5 Units, 0-5 Units, Subcutaneous, QHS, Kerney Elbe, MD, 2 Units at 05/10/17 2201 .  insulin aspart (novoLOG) injection 4 Units, 4 Units, Subcutaneous, TID WC, Kerney Elbe, MD, 4 Units at 05/11/17 [phone] .  linagliptin (TRADJENTA) tablet 5 mg, 5 mg, Oral, Daily, Rosalin Hawking, MD, 5 mg at 05/11/17 0813 .  methylPREDNISolone sodium succinate (SOLU-MEDROL) 125 mg/2 mL injection 125 mg, 125 mg, Intravenous, Once, Kerney Elbe, MD .  midodrine (PROAMATINE) tablet 5 mg, 5 mg, Oral, TID WC, Rosalin Hawking, MD, 5 mg at 05/11/17 0811 .  nystatin (MYCOSTATIN) 100000 UNIT/ML suspension 500,000 Units, 5 mL, Mouth/Throat, QID, Biby, Sharon L, NP, 500,000 Units at 05/11/17 [phone] .  ondansetron (ZOFRAN) injection 4 mg, 4 mg, Intravenous, Q6H PRN, Corrie Mckusick, DO .  senna-docusate (Senokot-S) tablet 1 tablet, 1 tablet, Oral, QHS PRN, Kerney Elbe, MD .  sodium chloride flush (NS) 0.9 % injection 10-40 mL, 10-40 mL, Intracatheter, PRN, Rosalin Hawking, MD  Patients Current Diet: Fall precautions Diet heart healthy/carb modified Room service  appropriate? 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  He wasactive around the house, repairs vacuums, and an active church member.    Home Assistive Devices / Equipment Home Assistive Devices/Equipment: Blood pressure cuff, CBG Meter, Dentures (specify type) Home Equipment: None  Prior Device Use: Indicate devices/aids used by the patient prior to current illness, exacerbation or injury? None of the above  Prior Functional Level Prior Function Level of Independence: Independent Comments: does all his ADL's and drives  Self Care: Did the patient need help bathing, dressing, using the toilet or eating? Independent  Indoor Mobility: Did the patient need assistance with walking from room to room (with or without device)?  Independent  Stairs: Did the patient need assistance with internal or external stairs (with or without device)? Independent  Functional Cognition: Did the patient need help planning regular tasks such as shopping or remembering to take medications? Independent  Current Functional Level Cognition  Arousal/Alertness: Awake/alert Overall Cognitive Status: Impaired/Different from baseline Difficult to assess due to: Impaired communication Current Attention Level: Sustained Orientation Level: Oriented X4 Following Commands: Follows multi-step commands inconsistently Safety/Judgement: Decreased awareness of safety, Decreased awareness of deficits General Comments: Pt not able to state his or wife's bday without contextual cues. Flat affect throughout with some appropriate smiling. Increased time to perform tasks. Per wife, more verbal today but sitill confused not close to baseline.  Attention: Sustained Sustained Attention: Appears intact    Extremity Assessment (includes Sensation/Coordination)  Upper Extremity Assessment: RUE deficits/detail RUE Deficits / Details: Decreased grasp strength and poor cooridnation. required increased cues, time, and effort for finger opposition. undershooting during finger-to-nose test presenting with dysmetria.  RUE Coordination: decreased fine motor, decreased gross motor  Lower Extremity Assessment: Defer to PT evaluation RLE Deficits / Details: R LE weakened, moves syngergistically with some isolation.  Gross extension 4-/5 RLE Coordination: decreased fine motor LLE Deficits / Details: WFL    ADLs  Overall ADL's : Needs assistance/impaired Grooming: Wash/dry hands, Set up, Sitting Grooming Details (indicate cue type and reason): Pt using hand sanitizer after toileting. required cues for sequencing and intiating task Upper Body Bathing: Minimal assistance, Sitting Lower Body Bathing: Minimal assistance, Sit to/from stand Upper Body Dressing : Minimal  assistance, Sitting Lower Body Dressing: Minimal assistance, Sit to/from stand Lower Body Dressing Details (indicate cue type and reason): Pt able to reach forward and adjust socks at EOB. Min A for standing balance Toilet Transfer: Minimal assistance, +2 for safety/equipment, Ambulation, RW(Simulated to recliner) Toileting- Clothing Manipulation and Hygiene: Minimal assistance, Sit to/from stand Toileting - Clothing Manipulation Details (indicate cue type and reason): Pt performing toilet hygiene after bowl incontinence in bed. Pt requiring Min A to make sure he was completely clean.  Functional mobility during ADLs: Min guard, Rolling walker General ADL Comments: Pt demonstrating decreased fucntional performance. Pt with poor cognition, balance, and fucnitonal use of RUE.    Mobility  Overal bed mobility: Needs Assistance Bed Mobility: Supine to Sit Supine to sit: Min guard, HOB elevated General bed mobility comments: Min Guard for safety; no assist needed.     Transfers  Overall transfer level: Needs assistance Equipment used: None Transfers: Sit to/from Stand Sit to Stand: Mod assist General transfer  comment: Assist to power to standing with pt reaching for UE assist on IV pole, slow to rise with cues for extension throughout. Transferred to chair post ambulation.    Ambulation / Gait / Stairs / Wheelchair Mobility  Ambulation/Gait Ambulation/Gait assistance: Min assist, +2 safety/equipment Ambulation Distance (Feet): 120 Feet Assistive device: None Gait Pattern/deviations: Step-to pattern, Step-through pattern, Decreased step length - right, Decreased step length - left, Decreased stance time - left, Decreased stride length, Shuffle, Narrow base of support General Gait Details: Slow, shuffling like gait pattern with decreased step length bilaterally; decreased stance time LLE and 2 standing rest breaks due to fatigue. Min A for balance, slight right lateral lean.  Gait velocity:  decreased    Posture / Balance Balance Overall balance assessment: Needs assistance Sitting-balance support: Feet supported, No upper extremity supported Sitting balance-Leahy Scale: Fair Standing balance support: During functional activity Standing balance-Leahy Scale: Fair Standing balance comment: reliant on external support    Special needs/care consideration BiPAP/CPAP: No CPM: No Continuous Drip IV: No Dialysis: No         Life Vest: No Oxygen: No Special Bed: No Trach Size: No Wound Vac (area): No       Skin: WDL                              Bowel mgmt: Incontinent, last BM 05/10/17 Bladder mgmt: Intermittent incontinence with external foley  Diabetic mgmt: Yes, HgbA1c 9.1 took oral medications at home PTA        Previous Home Environment Living Arrangements: Spouse/significant other Available Help at Discharge: Family, Available 24 hours/day Type of Home: House Home Layout: One level Home Access: Stairs to enter Entrance Stairs-Rails: Right, Left Entrance Stairs-Number of Steps: 2 Bathroom Shower/Tub: Multimedia programmer: Standard Home Care Services: No  Discharge Living Setting Plans for Discharge Living Setting: Lives with (comment)(Spouse, rents a house ) Type of Home at Discharge: House Discharge Home Layout: One level Discharge Home Access: Stairs to enter Entrance Stairs-Rails: Right Entrance Stairs-Number of Steps: 2 Discharge Bathroom Shower/Tub: Tub/shower unit, Curtain Discharge Bathroom Toilet: Standard Discharge Bathroom Accessibility: Yes How Accessible: Accessible via walker Does the patient have any problems obtaining your medications?: No  Social/Family/Support Systems Patient Roles: Spouse, Parent, Other (Comment)(Church Deacon ) Contact Information: Spouse: Chemical engineer  Anticipated Caregiver: Spouse Anticipated Caregiver's Contact Information: Cell: [phone] Ability/Limitations of Caregiver: None Caregiver  Availability: 24/7 Discharge Plan Discussed with Primary Caregiver: Yes Is Caregiver In Agreement with Plan?: Yes Does Caregiver/Family have Issues with Lodging/Transportation while Pt is in Rehab?: No  Goals/Additional Needs Patient/Family Goal for Rehab: PT/OT: Supervision; SLP: Mod I  Expected length of stay: 12-16 days  Cultural Considerations: Baptist, active as a Deacon in [employer]  Dietary Needs: Carb. Mod. and Heart Healthy diet restrictions  Equipment Needs: TBD Special Service Needs: None Pt/Family Agrees to Admission and willing to participate: Yes Program Orientation Provided & Reviewed with Pt/Caregiver Including Roles  & Responsibilities: Yes Additional Information Needs: Spouse with questions about equip. and home modifications  Information Needs to be Provided By: Team  Barriers to Discharge: Incontinence  Decrease burden of Care through IP rehab admission: No  Possible need for SNF placement upon discharge: No  Patient Condition: This patient's medical and functional status has changed since the consult dated: 05/09/17 at 11:32am in which the Rehabilitation Physician determined and documented that the patient's condition is appropriate for intensive rehabilitative care in an  inpatient rehabilitation facility. See "History of Present Illness" (above) for medical update. Functional changes are: Mod A transfers and Min A +2 120 feet. Patient's medical and functional status update has been discussed with the Rehabilitation physician and patient remains appropriate for inpatient rehabilitation. Will admit to inpatient rehab today.  Preadmission Screen Completed By:  Gunnar Fusi, 05/11/2017 11:35 AM ______________________________________________________________________   Discussed status with Dr. Naaman Plummer on 05/11/17 at 20 and received telephone approval for admission today.  Admission Coordinator:  Gunnar Fusi, time 1210/Date 05/11/17

## 2017-05-11 NOTE — Progress Notes (Signed)
Patient admitted to 4W10 via bed, escorted by nursing staff.  Patient oriented to unit, specifically to fall prevention policy.  Patients PAC deaccessed by IV nurse just after admitted to unit.  Patient denies pain and appears to be in no immediate distress at this time.  Brita Romp, RN

## 2017-05-11 NOTE — Progress Notes (Deleted)
Passed TIA/stroke report sheet off to [place]

## 2017-05-11 NOTE — Progress Notes (Addendum)
Inpatient Diabetes Program Recommendations  AACE/ADA: New Consensus Statement on Inpatient Glycemic Control (2015)  Target Ranges:  Prepandial:   less than 140 mg/dL      Peak postprandial:   less than 180 mg/dL (1-2 hours)      Critically ill patients:  140 - 180 mg/dL   Results for Edwin Jones, Edwin Jones (MRN [id number]) as of 05/11/2017 08:30  Ref. Range 05/10/2017 06:24 05/10/2017 11:32 05/10/2017 16:01 05/10/2017 22:00  Glucose-Capillary Latest Ref Range: 65 - 99 mg/dL 139 (H) 163 (H) 120 (H) 204 (H)   Results for Edwin Jones, Edwin Jones (MRN [id number]) as of 05/11/2017 08:30  Ref. Range 05/11/2017 06:09  Glucose-Capillary Latest Ref Range: 65 - 99 mg/dL 212 (H)   Results for Edwin Jones, Edwin Jones (MRN [id number]) as of 05/11/2017 08:30  Ref. Range 05/07/2017 05:00  Hemoglobin A1C Latest Ref Range: 4.8 - 5.6 % 9.1 (H)  214 mg/dl average    Home DM Meds: Glipizide 10 mg Edwin Jones       Tradjenta 5 mg Edwin Jones         Current Insulin Orders: Glipizide 10 mg Edwin Jones          Tradjenta 5 mg Edwin Jones      Novolog Moderate Correction Scale/ SSI (0-15 units) TID AC + HS      Novolog 4 units TID with meals      MD- Note fasting glucose elevated today.  May consider starting low dose basal insulin: Recommend Lantus 7 units Edwin Jones (0.075 units/kg dosing) for in-hospital control.  Current Hemoglobin A1c= 9.1%.  May be elevated due to the steroids he has been taking as an outpatient for cancer treatment.    --Will follow patient during hospitalization--  Wyn Quaker RN, MSN, CDE Diabetes Coordinator Inpatient Glycemic Control Team Team Pager: [phone] (8a-5p)

## 2017-05-11 NOTE — Progress Notes (Signed)
Inpatient Rehabilitation  Met with patient and spouse at bedside to discuss team's recommendation for IP Rehab.  Shared booklets, insurance verification letter, and answered questions.  I have received acute medical clearance and have a bed to offer today.  Plan to proceed with IP Rehab admission.  Call if questions.    Carmelia Roller., CCC/SLP Admission Coordinator  [place]  Cell [phone]

## 2017-05-11 NOTE — Discharge Summary (Addendum)
Stroke Discharge Summary  Patient ID: Edwin Jones   MRN: [id number]      DOB: [birth year]  Date of Admission: 05/06/2017 Date of Discharge: 05/11/2017  Attending Physician:  Garvin Fila, MD, Stroke MD Consultant(s):   Corrie Mckusick, DO (Interventional Neuroradiologist), Cristopher Peru, MD (cardiology ), Alysia Penna, MD (Physical Medicine & Rehabtilitation)  Patient's PCP:  Marguerita Merles, MD  Discharge Diagnoses:  Principal Problem:   Acute ischemic left MCA stroke [facility] of cryptogenic etiology with   left ICA/MCA tandem occlusion, requiring acute left carotid stenting and mechanical thrombectomy of a left M1 occlusion with achievement of mTICI-3 flow, and restoration of cervical ICA flow. 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 Cancer ([place])   . COPD (chronic obstructive pulmonary disease) ([place])   . Diabetes mellitus without complication ([place])   . GERD without esophagitis   . Hepatitis C virus    history of hep C treated with Harvoni  . Hypercholesterolemia   . Hypertension   . Seasonal allergies    Past Surgical History:  Procedure Laterality Date  . COLONOSCOPY     approximately 11 years ago  . FLEXIBLE BRONCHOSCOPY N/A 11/05/2016   Procedure: FLEXIBLE BRONCHOSCOPY;  Surgeon: Wilhelmina Mcardle, MD;  Location: [facility] ORS;  Service: Pulmonary;  Laterality: N/A;  . IR FLUORO GUIDE CV LINE RIGHT  05/06/2017  . IR INTRAVSC STENT CERV CAROTID W/O EMB-PROT MOD SED INC ANGIO  05/06/2017  . IR PERCUTANEOUS ART THROMBECTOMY/INFUSION INTRACRANIAL INC DIAG ANGIO  05/06/2017  . IR US GUIDE VASC ACCESS LEFT  05/06/2017  . IR US GUIDE VASC ACCESS RIGHT  05/06/2017  . IR US GUIDE VASC ACCESS RIGHT  05/06/2017  . PORTA CATH INSERTION N/A 11/23/2016   Procedure: Glori Luis Cath Insertion;  Surgeon:  Algernon Huxley, MD;  Location: [place] CV LAB;  Service: Cardiovascular;  Laterality: N/A;  . RADIOLOGY WITH ANESTHESIA N/A 05/06/2017   Procedure: IR WITH ANESTHESIA;  Surgeon: Radiologist, Medication, MD;  Location: [place];  Service: Radiology;  Laterality: N/A;  . TEE WITHOUT CARDIOVERSION N/A 05/10/2017   Procedure: TRANSESOPHAGEAL ECHOCARDIOGRAM (TEE);  Surgeon: Sanda Klein, MD;  Location: [facility];  Service: Cardiovascular;  Laterality: N/A;  . TONSILLECTOMY      Medications to be continued on Rehab . acyclovir ointment   Topical TID  . aspirin  325 mg Oral Daily  . Chlorhexidine Gluconate Cloth  6 each Topical Daily  . clopidogrel  75 mg Oral Daily  . fludrocortisone  0.1 mg Oral Daily  . fluticasone furoate-vilanterol  1 puff Inhalation Daily  . glipiZIDE  10 mg Oral Daily  . heparin injection (subcutaneous)  5,000 Units Subcutaneous Q8H  . insulin aspart  0-15 Units Subcutaneous TID WC  . insulin aspart  0-5 Units Subcutaneous QHS  . insulin aspart  4 Units Subcutaneous TID WC  . linagliptin  5 mg Oral Daily  . methylPREDNISolone (SOLU-MEDROL) injection  125 mg Intravenous Once  . midodrine  5 mg Oral TID WC  . nystatin  5 mL Mouth/Throat QID    LABORATORY STUDIES CBC    Component Value Date/Time   WBC 5.4 05/11/2017 0227   RBC 3.28 (L) 05/11/2017 0227   HGB 10.0 (L) 05/11/2017 0227   HCT 31.0 (  L) 05/11/2017 0227   PLT 192 05/11/2017 0227   MCV 94.5 05/11/2017 0227   MCH 30.5 05/11/2017 0227   MCHC 32.3 05/11/2017 0227   RDW 13.0 05/11/2017 0227   LYMPHSABS 0.8 05/06/2017 0312   MONOABS 0.8 05/06/2017 0312   EOSABS 0.1 05/06/2017 0312   BASOSABS 0.0 05/06/2017 0312   CMP    Component Value Date/Time   NA 139 05/11/2017 0227   K 4.2 05/11/2017 0227   CL 111 05/11/2017 0227   CO2 18 (L) 05/11/2017 0227   GLUCOSE 155 (H) 05/11/2017 0227   BUN 27 (H) 05/11/2017 0227   CREATININE 1.76 (H) 05/11/2017 0227   CALCIUM 8.3 (L) 05/11/2017 0227   PROT 6.6  05/06/2017 0312   ALBUMIN 3.1 (L) 05/06/2017 0312   AST 16 05/06/2017 0312   ALT 13 (L) 05/06/2017 0312   ALKPHOS 100 05/06/2017 0312   BILITOT 0.4 05/06/2017 0312   GFRNONAA 36 (L) 05/11/2017 0227   GFRAA 41 (L) 05/11/2017 0227   COAGS Lab Results  Component Value Date   INR 0.92 05/06/2017   INR 0.92 10/30/2016   Lipid Panel    Component Value Date/Time   CHOL 173 05/07/2017 0500   TRIG 215 (H) 05/07/2017 0500   HDL 61 05/07/2017 0500   CHOLHDL 2.8 05/07/2017 0500   VLDL 43 (H) 05/07/2017 0500   LDLCALC 69 05/07/2017 0500   HgbA1C  Lab Results  Component Value Date   HGBA1C 9.1 (H) 05/07/2017   Urinalysis    Component Value Date/Time   COLORURINE YELLOW 05/08/2017 1634   APPEARANCEUR CLEAR 05/08/2017 1634   LABSPEC 1.017 05/08/2017 1634   PHURINE 5.0 05/08/2017 1634   GLUCOSEU NEGATIVE 05/08/2017 1634   HGBUR LARGE (A) 05/08/2017 1634   BILIRUBINUR NEGATIVE 05/08/2017 1634   KETONESUR NEGATIVE 05/08/2017 1634   PROTEINUR 30 (A) 05/08/2017 1634   NITRITE NEGATIVE 05/08/2017 1634   LEUKOCYTESUR NEGATIVE 05/08/2017 1634   Urine Drug Screen     Component Value Date/Time   LABOPIA NONE DETECTED 05/06/2017 1011   COCAINSCRNUR NONE DETECTED 05/06/2017 1011   LABBENZ NONE DETECTED 05/06/2017 1011   AMPHETMU NONE DETECTED 05/06/2017 1011   THCU NONE DETECTED 05/06/2017 1011   LABBARB NONE DETECTED 05/06/2017 1011    Alcohol Level No results found for: [facility]   SIGNIFICANT DIAGNOSTIC STUDIES Ct Head Code Stroke Wo Contrast  05/06/2017 1. No acute stroke, hemorrhage, or mass effect identified. 2. Dense left M1, possible thrombus. 3. ASPECTS is 10 4. Stable chronic microvascular ischemic changes and parenchymal volume loss of the brain given differences in technique.   Ct Angio Head W Or Wo Contrast Ct Angio Neck W And/or Wo Contrast 05/06/2017 1. 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 Short segment of patency of left ICA terminus and proximal left M1 via  anterior communicating artery. 2. Left mid M1 occlusion with poor left MCA collateralization. 3. Patent right carotid and bilateral vertebral systems. 4. Patent bilateral ACA, right MCA, and bilateral PCA distributions. Intracranial atherosclerosis with multiple segments of mild-to-moderate stenosis.   Mr Brain 30 Contrast Mr Jodene Nam Head Wo Contrast 05/06/2017 1. Restored patency of the Left ICA, terminus, and Left MCA since earlier today. Mild residual irregularity throughout the left ICA siphon, and in the left M1 segment. Stable mild to moderate irregularity in the left A1 and distal A2 segments. 2. Associated core infarct primarily confined to the Left Basal Ganglia. There is a small 9 mm infarct in the left amygdala, and there  are small number of scattered tiny cortical infarcts elsewhere in the Left MCA territory. 3. Mild cytotoxic edema with no associated hemorrhage or mass effect. 4. 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   Angio and Neuro intervenentioan 05/06/2017 Status post left-sided cerebral angiogram with treatment of left ICA/MCA tandem occlusion, requiring acute left carotid stenting and mechanical thrombectomy of a left M1 occlusion with achievement of mTICI-3 flow, and restoration of cervical ICA flow. Flat panel CT in the room demonstrates small contrast stain without complicating features, and the patient was extubated. Status post deployment of 8 French Angio-Seal for right common femoral artery hemostasis. Status post ultrasound-guided left common femoral artery access with a 4 French sheath for arterial monitoring, which remained after the case. Status post ultrasound-guided right common femoral vein access for additional IV access, which remained after the case transmitting the Integrilin drip. Signed, Dulcy Fanny. Earleen Newport, DO [facility] Radiology PLAN: Patient will go to the PACU ICU  admission Bilateral hips will be straight until the left 4 French arterial monitoring sheath is removed and the right common femoral vein venous access sheath is removed. Routine wound management for the right common femoral artery sheath access. Frequent neurovascular checks. IV saline hydration for 8 hours given the renal insufficiency Blood pressure control management of 120-140 with nicardipine drip ordered The goal will be dual anti-platelet therapy, for now with dose of 650 mg aspirin and the Integrilin drip.   Dg Chest Port 1 View 05/06/2017 CLINICAL DATA:  Shortness of breath. Patient status post left MCA thrombectomy and left ICA stent placement today. EXAM: PORTABLE CHEST 1 VIEW COMPARISON:  PA and lateral chest 03/18/2017.  CT chest 03/24/2017. FINDINGS: Interstitial prominence about the patient's left upper lobe pulmonary mass is worse than on the prior plain films of the chest. Mild basilar atelectasis is noted. Heart size is normal. Port-A-Cath is in place. Aortic atherosclerosis is seen. IMPRESSION: Increased interstitial opacities about the patient's left upper lobe pulmonary mass are nonspecific and could be due to tumor spread, postobstructive pneumonitis or less likely hemorrhage. Atherosclerosis. Electronically Signed   By: Inge Rise M.D.   On: 05/06/2017 15:41   2D Echocardiogram  Left ventricle: The cavity size was normal. There was mildconcentric hypertrophy. Systolic function was normal. The estimated ejection fraction was in the range of 60% to 65%. Wall motion was normal; there were no regional wall motion abnormalities  TEE - Left ventricle: There was mild concentric hypertrophy. Systolic function was normal. The estimated ejection fraction was in the range of 55% to 60%. Wall motion was normal; there were no regional wall motion abnormalities. Doppler parameters are consistent with abnormal left ventricular relaxation (grade 1 diastolic dysfunction). - Left atrium: The  atrium was moderately dilated. No evidence of thrombus in the atrial cavity or appendage. - Right atrium: The atrium was mildly dilated. No evidence of thrombus in the atrial cavity or appendage. - Atrial septum: No defect or patent foramen ovale was identified. Echo contrast study showed no right-to-left atrial level shunt, at baseline or with provocation.  LE Venous Dopplers pending   EKG  normal sinus rhythm, left axis deviation. For complete results please see formal report.     HISTORY OF PRESENT ILLNESS Edwin Jones is an 77 y.o. male with known small cell cancer with brain metastases who presents with acute onset of right hemiparesis and facial droop. He was in his USOH when he went to bed  at 1 AM on 05/06/2017. On awakening to go to the bathroom, he noted right sided weakness. He woke up his wife, who called EMS. Vitals en route: CBG 476, BP 179/90. He has no prior history of stroke. he was LKW at 0100 on 05/06/2017. He was not given tPA d/t recent whole brain irradiation, presenting increased risk for hemorrhage. NIHSS: 8. CT showed L ICA and L M1 occlusion. He was felt to be safe for neuro endovascular therapy   HOSPITAL COURSE [name] is a 77 y.o. 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 No tPA given due to concern of brain metastasis. CTA showed a L ICA and L M1 occlusion. He was taken to IR where he had revascularization of the L ICA occlusion w/ angioplasty and L ICA stent, and mechanical thrombectomy of L M2 occlusion. Extubated post procedure.  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 cardioembolic source  Resultant left gaze preference, right facial droop and right sided mild weakness  MRI  Left BG,  mesial temporal lobe small infarcts and left MCA punctate infarcts  MRA  Patent left ICA and MCA  CTA head and neck - left ICA and left M1 occlusion  DSA - TICI3 reperfusion, left ICA stenting  2D Echo   EF 60-65% with no source of embolus  TEE negative for clot or PFO  Patient considering implantable loop recorder to evaluate for atrial fibrillation as etiology of stroke. He does not want placed at this time. For now, agreeable to cardiac monitoring at time of d/c from IP rehab.   LDL 69  HgbA1c 9.1  subq heparin for VTE prophylaxis Fall precautions  Diet heart healthy/carb modified Room service appropriate? Yes; Fluid consistency: Thin   aspirin 81 mg daily prior to admission, now on aspirin 325 mg daily and Plavix 75 given stent placement  Ongoing aggressive stroke risk factor management  Therapy recommendations:  CIR  Disposition:  CIR (supportive wife)  Small cell lung cancer   11/2016 brain MRI no brain metastasis  On chemo and radiation  Follows with [facility]  Discussed with Dr. Rogue Bussing, pt primary oncologist, no concern of hypercoagulable state at this time, he needs further cardiac work up  Diabetes type 2  HgbA1c 9.1 goal < 7.0  improving  On insulin, home meds  Diabetic coordinator recommends lantus  Rehab will follow and adjust meds   Hypertension, hypotension resolved  Likely due to ICA stenting  Long term BP goal normotensive  Treated w/ albumin  Now on lisinopril and hydrochlorothiazide.   Recommend holding home antihypertensive for now and monitoring  Hyperlipidemia  Home meds:  none   LDL 69, goal < 70  Statin allergy listed in file  CKD  Cre 1.98->1.80->1.88->1.7  At baseline   Good po intake, will stop IVF at d/c to CIR  Other Stroke Risk Factors  Advanced age  Former smoker - quit 13 years ago  Other Active Problems  GERD, without esophagitis, on Pepcid   Anemia, Hgb  10.3   DISCHARGE EXAM Blood pressure 133/64, pulse 62, temperature 99.5 F (37.5 C), temperature source Oral, resp. rate 18, height _0  (1.905 m), weight 96.2 kg (212 lb 1.3 oz), SpO2 98 %. General - Well  nourished, well developed pleasant elderly African-American male, in no acute distress. Cardiovascular - Regular rate and rhythm. Neuro - awake alert, eyes open, follows simple commands, paucity of speech, moderate dysarthria, able to repeat sentences and name. Eyes no gaze preference, no neglect, blinking to visual threat bilaterally. VFF, Can count fingers. Right facial droop, tongue midline. RUE 4/5 with drift weak grip, RLE 4/5, LUE 5/5, LLE 5/5. Sensation symmetrical face, arm and leg. Decreased R hand coordination. Gait deferred.  Discharge Diet  Diet heart healthy/carb modified Thin liquids  DISCHARGE PLAN  Disposition:  Transfer to [place] for ongoing PT, OT and ST  aspirin 325 mg daily and clopidogrel 75 mg daily for secondary stroke prevention given stent placement  Recommend ongoing risk factor control by Primary Care Physician at time of discharge from inpatient rehabilitation.  Follow-up Marguerita Merles, MD in 2 weeks following discharge from rehab.  Follow-up with Dr. Cecille Rubin, Stroke Clinic in 6 weeks, office to schedule an appointment.   60 minutes were spent preparing discharge.  [place] for Pager information 05/11/2017 1:56 PM    I have personally examined this patient, reviewed notes, independently viewed imaging studies, participated in medical decision making and plan of care.ROS completed by me personally and pertinent positives fully documented  I have made any additions or clarifications directly to the above note. Agree with note above.  Antony Contras, MD Medical Director [facility] Pager: [phone] 05/11/2017 3:01 PM

## 2017-05-11 NOTE — H&P (Signed)
Physical Medicine and Rehabilitation Admission H&P     Chief Complaint  Patient presents with  . Stroke with functional deficits.   HPI: Edwin Jones is an 77 y.o. 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 He did not receive TPA secondary to concerns of potential brain metastasis. CTA brain done revealing demonstrating left M1 occlusion and patient underwent mechanical thrombectomy and ICA stenting. Follow up MRI/MRA brain showed restored patency of L-ICA and L-MCA, mild cytotoxic edema and left basal ganglia infarct, small infarct in left amygdala and small number of scatterd tiny cortical infarcts in L-MCA territory. He had problems with hypotension after ICA stenting was placed on Neo-Synephrine drip as well as albumin and stress dose steroids. He was oral midodrine and florinef for BP support.  Dr. Erlinda Hong and Dr. Rogue Bussing felt that stroke was not related to hypercoagulable state but likely cardioembolic source therefore TEE done 3/4 and showed dilated left atrium negative for thrombus, NO PFO/ASD with very late bubbles in left atrium consistent with pulmonic re-circulation and no source of cardiac or aortic emboli. He was started on ASA/Plavix for embolic stroke and loop recorder recommended to rule out A fib. Blood sugars have been poorly controlled and low dose Lantus recommended for tighter control. Patient with resultant left sided weakness with shuffling gait with need for rest breaks, cognitive deficits and difficulty with ADL tasks. CIR recommended due to functional deficits.  Review of Systems  Constitutional: Negative for chills and fever.  HENT: Negative for hearing loss and tinnitus.  Eyes: Positive for blurred vision. Negative for double vision.  Respiratory: Positive for shortness of breath. Negative for cough.  Cardiovascular: Negative for  chest pain, claudication and leg swelling.  Gastrointestinal: Negative for constipation, heartburn and nausea.  Genitourinary: Negative for dysuria.  Musculoskeletal: Negative for back pain and myalgias.  Skin: Negative for itching and rash.  Neurological: Positive for speech change, focal weakness and weakness. Negative for dizziness and headaches.  Psychiatric/Behavioral: The patient is nervous/anxious and has insomnia.       Past Medical History:  Diagnosis Date  . Cancer ([place])   . COPD (chronic obstructive pulmonary disease) ([place])   . Diabetes mellitus without complication ([place])   . GERD without esophagitis   . Hepatitis C virus    history of hep C treated with Harvoni  . Hypercholesterolemia   . Hypertension   . Seasonal allergies         Past Surgical History:  Procedure Laterality Date  . COLONOSCOPY     approximately 11 years ago  . FLEXIBLE BRONCHOSCOPY N/A 11/05/2016   Procedure: FLEXIBLE BRONCHOSCOPY; Surgeon: Wilhelmina Mcardle, MD; Location: [facility] ORS; Service: Pulmonary; Laterality: N/A;  . IR FLUORO GUIDE CV LINE RIGHT  05/06/2017  . IR INTRAVSC STENT CERV CAROTID W/O EMB-PROT MOD SED INC ANGIO  05/06/2017  . IR PERCUTANEOUS ART THROMBECTOMY/INFUSION INTRACRANIAL INC DIAG ANGIO  05/06/2017  . IR US GUIDE VASC ACCESS LEFT  05/06/2017  . IR US GUIDE VASC ACCESS RIGHT  05/06/2017  . IR US GUIDE VASC ACCESS RIGHT  05/06/2017  . PORTA CATH INSERTION N/A 11/23/2016   Procedure: Glori Luis Cath Insertion; Surgeon: Algernon Huxley, MD; Location: [facility]; Service: Cardiovascular; Laterality: N/A;  . RADIOLOGY WITH ANESTHESIA N/A 05/06/2017   Procedure: IR WITH ANESTHESIA; Surgeon: Radiologist, Medication, MD; Location: [facility];  Service: Radiology; Laterality: N/A;  . TEE WITHOUT CARDIOVERSION N/A 05/10/2017   Procedure: TRANSESOPHAGEAL ECHOCARDIOGRAM (TEE); Surgeon: Sanda Klein, MD; Location: [facility] ENDOSCOPY; Service: Cardiovascular; Laterality: N/A;  . TONSILLECTOMY            Family History  Problem Relation Age of Onset  . Skin cancer Mother   . Ovarian cancer Sister    sister was diagnosed as a teenager  . Throat cancer Brother 26   brother #1  . Lung cancer Sister 68  . Throat cancer Brother 58   brother #2  . Skin cancer Sister    Social History: Married. Independent PTA. 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 He reports that he quit smoking 03/2004. His smoking use included cigarettes. He has a 50.00 pack-year smoking history. He has never used smokeless tobacco. He reports that he does not drink alcohol or use drugs.       Allergies  Allergen Reactions  . Penicillin G Anaphylaxis    Other reaction(s): Other (See Comments)  Couldn't breath among other things  Has patient had a PCN reaction causing immediate rash, facial/tongue/throat swelling, SOB or lightheadedness with hypotension: Yes  Has patient had a PCN reaction causing severe rash involving mucus membranes or skin necrosis: No  Has patient had a PCN reaction that required hospitalization: Yes  Has patient had a PCN reaction occurring within the last 10 years: No  If all of the above answers are "NO", then may proceed with Ce  . Shellfish-Derived Products Anaphylaxis  . Statins     Other reaction(s): Other (See Comments)  Muscle spasms - can't walk - couldn't turn over in bed  . Erythromycin Itching    All mycins  . Tamsulosin     Other reaction(s): Dizziness  . Tuberculin Ppd     Other reaction(s): Other (See Comments)  False test   . Iodinated Diagnostic Agents Hives         Medications Prior to Admission  Medication Sig Dispense Refill  . albuterol (PROVENTIL HFA;VENTOLIN HFA) 108 (90 Base) MCG/ACT inhaler Inhale 2 puffs into the lungs every 6 (six) hours as needed for wheezing or shortness of breath. 1 Inhaler 2  . APPLE CIDER VINEGAR PO Take 450 mg by mouth at bedtime.     Marland Kitchen aspirin EC 81 MG tablet Take 81 mg by mouth at bedtime.     . cetirizine (ZYRTEC) 10 MG tablet  Take 10 mg by mouth daily.    . cholecalciferol (VITAMIN D) 1000 units tablet Take 1,000 Units by mouth daily.    . Coenzyme Q10 100 MG capsule Take 100 mg by mouth daily.     . Flaxseed, Linseed, (FLAXSEED OIL) 1000 MG CAPS Take 1,000 mg by mouth at bedtime.     . fluticasone (FLONASE) 50 MCG/ACT nasal spray Place 1 spray into both nostrils daily as needed for allergies or rhinitis.    . Fluticasone-Salmeterol (ADVAIR DISKUS) 500-50 MCG/DOSE AEPB Inhale 1 puff into the lungs 2 (two) times daily. 1 each 3  . glipiZIDE (GLUCOTROL) 10 MG tablet Take 10 mg by mouth daily. Time release capsule    . glucagon (GLUCAGON EMERGENCY) 1 MG injection Inject 1 mg into the muscle once as needed. When blood glucose drops less than 50; and if patient has difficulty drinking. 1 each 12  . GNP GARLIC EXTRACT PO Take 6,045 mg by mouth daily.     . hydrochlorothiazide (HYDRODIURIL) 25 MG tablet Take 25 mg by mouth  daily.    . linagliptin (TRADJENTA) 5 MG TABS tablet Take 5 mg by mouth daily.    Marland Kitchen lisinopril (PRINIVIL,ZESTRIL) 10 MG tablet Take 10 mg by mouth daily.    . methocarbamol (ROBAXIN) 500 MG tablet Take 1 tablet (500 mg total) by mouth every 8 (eight) hours as needed for muscle spasms. 60 tablet 0  . Multiple Vitamin (MULTIVITAMIN WITH MINERALS) TABS tablet Take 1 tablet by mouth daily.    Marland Kitchen omeprazole (PRILOSEC) 20 MG capsule Take 20 mg by mouth every Monday, Wednesday, and Friday.     . simethicone (MYLICON) 80 MG chewable tablet Chew 80 mg by mouth as needed for flatulence.     . vitamin C (ASCORBIC ACID) 250 MG tablet Take 250 mg by mouth daily.    Marland Kitchen zinc sulfate 220 (50 Zn) MG capsule Take 220 mg by mouth daily.    Marland Kitchen dexamethasone (DECADRON) 4 MG tablet Take 1 tablet (4 mg total) by mouth daily. Starting on the first day of radiation (Patient not taking: Reported on 05/06/2017) 25 tablet 0  . lidocaine-prilocaine (EMLA) cream Apply 1 application topically as needed. (Patient not taking: Reported on  05/06/2017) 30 g 3  . ondansetron (ZOFRAN) 8 MG tablet Take 1 tablet (8 mg total) by mouth every 8 (eight) hours as needed for nausea or vomiting (start 3 days; after chemo). (Patient not taking: Reported on 03/25/2017) 40 tablet 1  . prochlorperazine (COMPAZINE) 10 MG tablet Take 1 tablet (10 mg total) by mouth every 6 (six) hours as needed for nausea or vomiting. (Patient not taking: Reported on 03/25/2017) 40 tablet 1  . ZINC-VITAMIN C MT Take 1 tablet daily by mouth.     Drug Regimen Review  Drug regimen was reviewed and remains appropriate with no significant issues identified  Home:  Home Living  Family/patient expects to be discharged to:: Private residence  Living Arrangements: Spouse/significant other  Available Help at Discharge: Family, Available 24 hours/day  Type of Home: House  Home Access: Stairs to enter  [employer] of Steps: 2  Entrance Stairs-Rails: Right, Left  Home Layout: One level  Bathroom Shower/Tub: Tourist information centre manager: Standard  Home Equipment: None  Functional History:  Prior Function  Level of Independence: Independent  Comments: does all his ADL's and drives  Functional Status:  Mobility:  Bed Mobility  Overal bed mobility: Needs Assistance  Bed Mobility: Supine to Sit  Supine to sit: Min guard, HOB elevated  General bed mobility comments: Min Guard for safety; no assist needed.  Transfers  Overall transfer level: Needs assistance  Equipment used: None  Transfers: Sit to/from Stand  Sit to Stand: Mod assist  General transfer comment: Assist to power to standing with pt reaching for UE assist on IV pole, slow to rise with cues for extension throughout. Transferred to chair post ambulation.  Ambulation/Gait  Ambulation/Gait assistance: Min assist, +2 safety/equipment  Ambulation Distance (Feet): 120 Feet  Assistive device: None  Gait Pattern/deviations: Step-to pattern, Step-through pattern, Decreased step length - right,  Decreased step length - left, Decreased stance time - left, Decreased stride length, Shuffle, Narrow base of support  General Gait Details: Slow, shuffling like gait pattern with decreased step length bilaterally; decreased stance time LLE and 2 standing rest breaks due to fatigue. Min A for balance, slight right lateral lean.  Gait velocity: decreased   ADL:  ADL  Overall ADL's : Needs assistance/impaired  Grooming: Wash/dry hands, Set up, Sitting  Grooming Details (indicate  cue type and reason): Pt using hand sanitizer after toileting. required cues for sequencing and intiating task  Upper Body Bathing: Minimal assistance, Sitting  Lower Body Bathing: Minimal assistance, Sit to/from stand  Upper Body Dressing : Minimal assistance, Sitting  Lower Body Dressing: Minimal assistance, Sit to/from stand  Lower Body Dressing Details (indicate cue type and reason): Pt able to reach forward and adjust socks at EOB. Min A for standing balance  Toilet Transfer: Minimal assistance, +2 for safety/equipment, Ambulation, RW(Simulated to recliner)  Toileting- Clothing Manipulation and Hygiene: Minimal assistance, Sit to/from stand  Toileting - Clothing Manipulation Details (indicate cue type and reason): Pt performing toilet hygiene after bowl incontinence in bed. Pt requiring Min A to make sure he was completely clean.  Functional mobility during ADLs: Min guard, Rolling walker  General ADL Comments: Pt demonstrating decreased fucntional performance. Pt with poor cognition, balance, and fucnitonal use of RUE.  Cognition:  Cognition  Overall Cognitive Status: Impaired/Different from baseline  Arousal/Alertness: Awake/alert  Orientation Level: Oriented X4  Attention: Sustained  Sustained Attention: Appears intact  Cognition  Arousal/Alertness: Awake/alert  Behavior During Therapy: Flat affect  Overall Cognitive Status: Impaired/Different from baseline  Area of Impairment: Orientation, Memory, Problem  solving, Following commands  Orientation Level: Disoriented to, Place, Time("" "November")  Current Attention Level: Sustained  Memory: Decreased short-term memory  Following Commands: Follows multi-step commands inconsistently  Safety/Judgement: Decreased awareness of safety, Decreased awareness of deficits  Awareness: Emergent  Problem Solving: Slow processing, Requires verbal cues  General Comments: Pt not able to state his or wife's bday without contextual cues. Flat affect throughout with some appropriate smiling. Increased time to perform tasks. Per wife, more verbal today but sitill confused not close to baseline.  Difficult to assess due to: Impaired communication  Blood pressure 133/64, pulse 62, temperature 99.5 F (37.5 C), temperature source Oral, resp. rate 18, height _0  (1.905 m), weight 96.2 kg (212 lb 1.3 oz), SpO2 98 %.  Physical Exam  Nursing note and vitals reviewed.  Constitutional: He is oriented to person, place, and time. He appears well-developed and well-nourished. He appears lethargic. He is easily aroused. No distress.  HENT:  Head: Normocephalic and atraumatic.  Mouth/Throat: Oropharynx is clear and moist.  Cardiovascular: Normal rate and regular rhythm. Exam reveals no friction rub.  No murmur heard.  Respiratory: Accessory muscle usage (occasionally with speech) present. No respiratory distress. He has no wheezes. He has rhonchi in the right upper field and the left upper field. He has no rales.  Increased WOB with speech--question anxiety.  GI: Soft. Bowel sounds are normal. He exhibits no distension. There is no tenderness.  Neurological: He is oriented to person, place, and time and easily aroused. He appears lethargic.  Lethargic and slow to initiate--he did improve as exam progressed. Right facial weakness with wet voice occasionally. Voice soft and whispery at times. Dysarthric speech. Expressive language deficits--was unable to state age or date  of birth with perseverative speech. He was able to state name of [place], city as well as his children. He was able to follow simple one and two step commands with minimal cues. MMT: Right upper extremity 4 out of 5 proximal distal. Right lower extremity 4 out of 5 proximal to distal. LUE and LLE 5/5. Senses pain and light touch in both right arm and left arm as well as right leg and left leg  Skin: Skin is warm and dry. He is not diaphoretic.  Psychiatric: His affect is  blunt. His speech is delayed and slurred. He is slowed and withdrawn. Cognition and memory are impaired. He is inattentive.   Lab Results Last 48 Hours  Imaging Results (Last 48 hours)     Medical Problem List and Plan:  1. Functional and cognitive deficits secondary to left MCA infarct  -admit to inpatient rehab  2. DVT Prophylaxis/Anticoagulation: Pharmaceutical: Heparin. Order dopplers to rule out DVT due to PFO  3. Pain Management: tylenol prn  4. Mood: Appears depressed. Team to provide ego support. LCSW to follow for evaluation and support.  5. Neuropsych: This patient is not  capable of making decisions on his own behalf.  6. Skin/Wound Care: routine pressure relief measures.  7. Fluids/Electrolytes/Nutrition: Monitor I/O. Intake 100% at lunch.  8. Hypotension: Will continue to monitor BP every 4 hours for 24 hours per wife's request and concerns. Will Continue IVF, midodrine and florinef for BP support.  9. Lethargy with increased right facial weakness (per wife): Time spent educating wife on fluctuating symptoms with fatigue as well as did receive fentanyl/ Versed yesterday afternoon for TEE. Did appear to be improving slightly later in the afternoon today.  10. COPD: Continue Breo. Albuterol nebs prn. Will check CXR to rule out overload due to ongoing IVF.  11. T2DM: Hgb A1c- Off steriods. Will monitor BS ac/hs. Continue glipizide and trajenta with SSI for elevated BS.  12. L-ICA stent: On ASA and plavix.    Post Admission Physician Evaluation:  1. Functional deficits secondary to left MCA infarct. 2. Patient is admitted to receive collaborative, interdisciplinary care between the physiatrist, rehab nursing staff, and therapy team. 3. Patient's level of medical complexity and substantial therapy needs in context of that medical necessity cannot be provided at a lesser intensity of care such as a SNF. 4. Patient has experienced substantial functional loss from his/her baseline which was documented above under the "Functional History" and "Functional Status" headings. Judging by the patient's diagnosis, physical exam, and functional history, the patient has potential for functional progress which will result in measurable gains while on inpatient rehab. These gains will be of substantial and practical use upon discharge in facilitating mobility and self-care at the household level. 5. Physiatrist will provide 24 hour management of medical needs as well as oversight of the therapy plan/treatment and provide guidance as appropriate regarding the interaction of the  two. 6. The Preadmission Screening has been reviewed and patient status is unchanged unless otherwise stated above. 7. 24 hour rehab nursing will assist with bladder management, bowel management, safety, skin/wound care, disease management, medication administration, pain management and patient education and help integrate therapy concepts, techniques,education, etc. 8. PT will assess and treat for/with: Lower extremity strength, range of motion, stamina, balance, functional mobility, safety, adaptive techniques and equipment, NMR, family ed. Goals are: supervision. 9. OT will assess and treat for/with: ADL's, functional mobility, safety, upper extremity strength, adaptive techniques and equipment, NMR, family ed. Goals are: supervision. Therapy may proceed with showering this patient. 10. SLP will assess and treat for/with: cognition, language, communication. Goals are: mod I. 11. Case Management and Social Worker will assess and treat for psychological issues and discharge planning. 12. Team conference will be held weekly to assess progress toward goals and to determine barriers to discharge. 13. Patient will receive at least 3 hours of therapy per day at least 5 days per week. 14. ELOS: 12-16 days  15. Prognosis: excellent   Meredith Staggers, MD, [facility]  05/11/2017  Bary Leriche, PA-C  05/11/2017

## 2017-05-11 NOTE — Progress Notes (Signed)
Report given to 4W, Therapist, sports. All questions were answered.

## 2017-05-12 ENCOUNTER — Inpatient Hospital Stay (HOSPITAL_COMMUNITY): Payer: Medicare Other | Admitting: Speech Pathology

## 2017-05-12 ENCOUNTER — Inpatient Hospital Stay: Payer: Medicare Other | Admitting: Internal Medicine

## 2017-05-12 ENCOUNTER — Inpatient Hospital Stay: Payer: Medicare Other

## 2017-05-12 ENCOUNTER — Inpatient Hospital Stay (HOSPITAL_COMMUNITY): Payer: Medicare Other | Admitting: Occupational Therapy

## 2017-05-12 ENCOUNTER — Inpatient Hospital Stay (HOSPITAL_COMMUNITY): Payer: Medicare Other | Admitting: Physical Therapy

## 2017-05-12 DIAGNOSIS — C3492 Malignant neoplasm of unspecified part of left bronchus or lung: Secondary | ICD-10-CM

## 2017-05-12 DIAGNOSIS — I69322 Dysarthria following cerebral infarction: Secondary | ICD-10-CM

## 2017-05-12 DIAGNOSIS — I69320 Aphasia following cerebral infarction: Secondary | ICD-10-CM

## 2017-05-12 DIAGNOSIS — G8191 Hemiplegia, unspecified affecting right dominant side: Secondary | ICD-10-CM

## 2017-05-12 LAB — COMPREHENSIVE METABOLIC PANEL
ALBUMIN: 2.4 g/dL — AB (ref 3.5–5.0)
ALT: 11 U/L — AB (ref 17–63)
AST: 16 U/L (ref 15–41)
Alkaline Phosphatase: 68 U/L (ref 38–126)
Anion gap: 11 (ref 5–15)
BUN: 20 mg/dL (ref 6–20)
CHLORIDE: 107 mmol/L (ref 101–111)
CO2: 21 mmol/L — AB (ref 22–32)
CREATININE: 1.8 mg/dL — AB (ref 0.61–1.24)
Calcium: 8.5 mg/dL — ABNORMAL LOW (ref 8.9–10.3)
GFR calc Af Amer: 40 mL/min — ABNORMAL LOW (ref >60)
GFR calc non Af Amer: 35 mL/min — ABNORMAL LOW (ref >60)
GLUCOSE: 219 mg/dL — AB (ref 65–99)
Potassium: 4.1 mmol/L (ref 3.5–5.1)
SODIUM: 139 mmol/L (ref 135–145)
Total Bilirubin: 0.5 mg/dL (ref 0.3–1.2)
Total Protein: 5.9 g/dL — ABNORMAL LOW (ref 6.5–8.1)

## 2017-05-12 LAB — URINALYSIS, ROUTINE W REFLEX MICROSCOPIC
Bilirubin Urine: NEGATIVE
GLUCOSE, UA: 50 mg/dL — AB
Hgb urine dipstick: NEGATIVE
Ketones, ur: NEGATIVE mg/dL
LEUKOCYTES UA: NEGATIVE
Nitrite: NEGATIVE
PROTEIN: NEGATIVE mg/dL
SPECIFIC GRAVITY, URINE: 1.015 (ref 1.005–1.030)
pH: 5 (ref 5.0–8.0)

## 2017-05-12 LAB — CBC WITH DIFFERENTIAL/PLATELET
BASOS ABS: 0 10*3/uL (ref 0.0–0.1)
BASOS PCT: 1 %
EOS ABS: 0.1 10*3/uL (ref 0.0–0.7)
EOS PCT: 2 %
HCT: 33.1 % — ABNORMAL LOW (ref 39.0–52.0)
HEMOGLOBIN: 10.6 g/dL — AB (ref 13.0–17.0)
Lymphocytes Relative: 17 %
Lymphs Abs: 1 10*3/uL (ref 0.7–4.0)
MCH: 30.7 pg (ref 26.0–34.0)
MCHC: 32 g/dL (ref 30.0–36.0)
MCV: 95.9 fL (ref 78.0–100.0)
Monocytes Absolute: 0.4 10*3/uL (ref 0.1–1.0)
Monocytes Relative: 7 %
NEUTROS PCT: 73 %
Neutro Abs: 4.3 10*3/uL (ref 1.7–7.7)
PLATELETS: 201 10*3/uL (ref 150–400)
RBC: 3.45 MIL/uL — AB (ref 4.22–5.81)
RDW: 13.3 % (ref 11.5–15.5)
WBC: 5.9 10*3/uL (ref 4.0–10.5)

## 2017-05-12 LAB — GLUCOSE, CAPILLARY
GLUCOSE-CAPILLARY: 269 mg/dL — AB (ref 65–99)
GLUCOSE-CAPILLARY: 80 mg/dL (ref 65–99)
Glucose-Capillary: 188 mg/dL — ABNORMAL HIGH (ref 65–99)
Glucose-Capillary: 111 mg/dL — ABNORMAL HIGH (ref 65–99)

## 2017-05-12 MED ORDER — MIDODRINE HCL 5 MG PO TABS
2.5000 mg | ORAL_TABLET | Freq: Three times a day (TID) | ORAL | Status: DC
  Administered 2017-05-12 – 2017-05-17 (×16): 2.5 mg via ORAL
  Filled 2017-05-12 (×16): qty 1

## 2017-05-12 NOTE — Progress Notes (Signed)
Physical Medicine and Rehabilitation Consult Reason for Consult:Stroke rehabilitation Referring Phsyician: Edwin Jones is an 77 y.o. male.   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  He did not receive TPA secondary to concerns of potential brain metastasis.  Patient was evaluated by neurology, MRI demonstrating left M1 occlusion and patient underwent mechanical thrombectomy and ICA stenting.  Conversations between oncology and neurology suggested that the stroke was not related to hypercoagulable state but likely cardioembolic source and therefore TEE was ordered after transthoracic echocardiogram did not demonstrate embolic source.  Patient was on aspirin 81 mg prior to admission, placed on Integrilin but this was switched to Plavix.  Patient had problems with hypotension after ICA stenting was placed on Neo-Synephrine drip and then switched to oral midodrine.  Patient is a diabetic and he has been started on Tradjenta as well as Glucotrol. Physical therapy evaluation 05/07/2017, mod assist with transfers mod assist 15 feet with rolling walker. Speech therapy evaluation 05/06/2017, regular diet with thin liquids recommended small sips with upright seating.  Language evaluation by speech therapy on 05/06/2017 demonstrated moderate expressive aphasia with poor ability to self-correct Review of Systems - Review of Systems  Constitutional: Negative for chills and fever.  HENT: Negative for nosebleeds.   Eyes: Negative for blurred vision, discharge and redness.  Respiratory: Negative for cough, shortness of breath, wheezing and stridor.   Cardiovascular: Negative for chest pain, palpitations and leg swelling.  Gastrointestinal: Positive for constipation. Negative for abdominal pain, nausea and vomiting.  Genitourinary: Negative for frequency and urgency.    Musculoskeletal: Negative for back pain, joint pain and neck pain.  Skin: Negative for itching and rash.  Neurological: Positive for speech change, focal weakness and weakness. Negative for headaches.  Endo/Heme/Allergies: Does not bruise/bleed easily.  Psychiatric/Behavioral: The patient is not nervous/anxious.         Past Medical History:  Diagnosis Date  . Cancer ([place])   . COPD (chronic obstructive pulmonary disease) ([place])   . Diabetes mellitus without complication ([place])   . GERD without esophagitis   . Hepatitis C virus    history of hep C treated with Harvoni  . Hypercholesterolemia   . Hypertension   . Seasonal allergies         Past Surgical History:  Procedure Laterality Date  . COLONOSCOPY     approximately 11 years ago  . FLEXIBLE BRONCHOSCOPY N/A 11/05/2016   Procedure: FLEXIBLE BRONCHOSCOPY;  Surgeon: Wilhelmina Mcardle, MD;  Location: [facility] ORS;  Service: Pulmonary;  Laterality: N/A;  . IR FLUORO GUIDE CV LINE RIGHT  05/06/2017  . IR INTRAVSC STENT CERV CAROTID W/O EMB-PROT MOD SED INC ANGIO  05/06/2017  . IR PERCUTANEOUS ART THROMBECTOMY/INFUSION INTRACRANIAL INC DIAG ANGIO  05/06/2017  . IR US GUIDE VASC ACCESS LEFT  05/06/2017  . IR US GUIDE VASC ACCESS RIGHT  05/06/2017  . IR US GUIDE VASC ACCESS RIGHT  05/06/2017  . PORTA CATH INSERTION N/A 11/23/2016   Procedure: Glori Luis Cath Insertion;  Surgeon: Algernon Huxley, MD;  Location: [place] CV LAB;  Service: Cardiovascular;  Laterality: N/A;  . RADIOLOGY WITH ANESTHESIA N/A 05/06/2017   Procedure: IR WITH ANESTHESIA;  Surgeon: Radiologist, Medication, MD;  Location: [place];  Service: Radiology;  Laterality: N/A;  . TONSILLECTOMY          Family History  Problem Relation Age of Onset  .  Skin cancer Mother   . Ovarian cancer Sister        sister was diagnosed as a teenager  . Throat cancer Brother 65       brother #1  . Lung cancer Sister 64  . Throat cancer Brother 75       brother  #2  . Skin cancer Sister    Social History:  reports that he quit smoking about 13 years ago. His smoking use included cigarettes. He has a 50.00 pack-year smoking history. he has never used smokeless tobacco. He reports that he does not drink alcohol or use drugs. Allergies:       Allergies  Allergen Reactions  . Penicillin G Anaphylaxis    Other reaction(s): Other (See Comments) Couldn't breath among other things Has patient had a PCN reaction causing immediate rash, facial/tongue/throat swelling, SOB or lightheadedness with hypotension: Yes Has patient had a PCN reaction causing severe rash involving mucus membranes or skin necrosis: No Has patient had a PCN reaction that required hospitalization: Yes Has patient had a PCN reaction occurring within the last 10 years: No If all of the above answers are "NO", then may proceed with Ce  . Shellfish-Derived Products Anaphylaxis  . Statins     Other reaction(s): Other (See Comments) Muscle spasms - can't walk - couldn't turn over in bed  . Erythromycin Itching    All mycins  . Tamsulosin     Other reaction(s): Dizziness  . Tuberculin Ppd     Other reaction(s): Other (See Comments) False test   . Iodinated Diagnostic Agents Hives         Medications Prior to Admission  Medication Sig Dispense Refill  . albuterol (PROVENTIL HFA;VENTOLIN HFA) 108 (90 Base) MCG/ACT inhaler Inhale 2 puffs into the lungs every 6 (six) hours as needed for wheezing or shortness of breath. 1 Inhaler 2  . APPLE CIDER VINEGAR PO Take 450 mg by mouth at bedtime.     . aspirin EC 81 MG tablet Take 81 mg by mouth at bedtime.     . cetirizine (ZYRTEC) 10 MG tablet Take 10 mg by mouth daily.    . cholecalciferol (VITAMIN D) 1000 units tablet Take 1,000 Units by mouth daily.    . Coenzyme Q10 100 MG capsule Take 100 mg by mouth daily.     . Flaxseed, Linseed, (FLAXSEED OIL) 1000 MG CAPS Take 1,000 mg by mouth at bedtime.     .  fluticasone (FLONASE) 50 MCG/ACT nasal spray Place 1 spray into both nostrils daily as needed for allergies or rhinitis.    . Fluticasone-Salmeterol (ADVAIR DISKUS) 500-50 MCG/DOSE AEPB Inhale 1 puff into the lungs 2 (two) times daily. 1 each 3  . glipiZIDE (GLUCOTROL) 10 MG tablet Take 10 mg by mouth daily. Time release capsule    . glucagon (GLUCAGON EMERGENCY) 1 MG injection Inject 1 mg into the muscle once as needed. When blood glucose drops less than 50; and if patient has difficulty drinking. 1 each 12  . GNP GARLIC EXTRACT PO Take 1,000 mg by mouth daily.     . hydrochlorothiazide (HYDRODIURIL) 25 MG tablet Take 25 mg by mouth daily.    . linagliptin (TRADJENTA) 5 MG TABS tablet Take 5 mg by mouth daily.    . lisinopril (PRINIVIL,ZESTRIL) 10 MG tablet Take 10 mg by mouth daily.    . methocarbamol (ROBAXIN) 500 MG tablet Take 1 tablet (500 mg total) by mouth every 8 (eight) hours as needed for   muscle spasms. 60 tablet 0  . Multiple Vitamin (MULTIVITAMIN WITH MINERALS) TABS tablet Take 1 tablet by mouth daily.    Marland Kitchen omeprazole (PRILOSEC) 20 MG capsule Take 20 mg by mouth every Monday, Wednesday, and Friday.     . simethicone (MYLICON) 80 MG chewable tablet Chew 80 mg by mouth as needed for flatulence.     . vitamin C (ASCORBIC ACID) 250 MG tablet Take 250 mg by mouth daily.    Marland Kitchen zinc sulfate 220 (50 Zn) MG capsule Take 220 mg by mouth daily.    Marland Kitchen dexamethasone (DECADRON) 4 MG tablet Take 1 tablet (4 mg total) by mouth daily. Starting on the first day of radiation (Patient not taking: Reported on 05/06/2017) 25 tablet 0  . lidocaine-prilocaine (EMLA) cream Apply 1 application topically as needed. (Patient not taking: Reported on 05/06/2017) 30 g 3  . ondansetron (ZOFRAN) 8 MG tablet Take 1 tablet (8 mg total) by mouth every 8 (eight) hours as needed for nausea or vomiting (start 3 days; after chemo). (Patient not taking: Reported on 03/25/2017) 40 tablet 1  . prochlorperazine  (COMPAZINE) 10 MG tablet Take 1 tablet (10 mg total) by mouth every 6 (six) hours as needed for nausea or vomiting. (Patient not taking: Reported on 03/25/2017) 40 tablet 1  . ZINC-VITAMIN C MT Take 1 tablet daily by mouth.      Home: Home Living Family/patient expects to be discharged to:: Private residence Living Arrangements: Spouse/significant other Available Help at Discharge: Family, Available 24 hours/day Type of Home: House Home Access: Stairs to enter [employer] of Steps: 2 Entrance Stairs-Rails: Right, Left Home Layout: One level Bathroom Shower/Tub: Multimedia programmer: Standard Home Equipment: None  Functional History: Prior Function Comments: does all his ADL's and drives Functional Status:  Mobility: Ambulation/Gait Ambulation Distance (Feet): 15 Feet General Gait Details: paretic gait with small tentative steps and need for stability and maneuvering of the RW  ADL:  Cognition: Cognition Overall Cognitive Status: Difficult to assess Arousal/Alertness: Awake/alert Orientation Level: Oriented to person, Oriented to place, Oriented to situation Attention: Sustained Sustained Attention: Appears intact Cognition Arousal/Alertness: Awake/alert Behavior During Therapy: WFL for tasks assessed/performed Overall Cognitive Status: Difficult to assess Difficult to assess due to: Impaired communication  Blood pressure 140/74, pulse 69, temperature 98.4 F (36.9 C), resp. rate 17, height _0  (1.905 m), weight 96.2 kg (212 lb 1.3 oz), SpO2 99 %. Physical Exam  General: No acute distress Mood and affect are appropriate Heart: Regular rate and rhythm no rubs murmurs or extra sounds Lungs: Clear to auscultation, breathing unlabored, no rales or wheezes Abdomen: Positive bowel sounds, soft nontender to palpation, nondistended Extremities: No clubbing, cyanosis, or edema Skin: No evidence of breakdown, no evidence of rash, RIght upper chest  Port A Cath CDI Neurologic: Cranial nerves II through XII intact, motor strength is 5/5 in left deltoid, bicep, tricep, grip, hip flexor, knee extensors, ankle dorsiflexor and plantar flexor 3-/5 right deltoid bicep tricep grip, 4- in the right hip flexor knee extensor ankle dorsiflexor Sensory exam normal sensation to light touch and proprioception in bilateral upper and lower extremities Dual fields are intact to confrontation testing Musculoskeletal: Full range of motion in all 4 extremities. 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 Does the need for close, 24 hr/day medical supervision in concert with the patient's rehab needs make it unreasonable for  this patient to be served in a less intensive setting? Yes 2. Co-Morbidities requiring supervision/potential complications: Stage III small cell lung CA, diabetes, COPD completed chemo with ongoing radiation therapy 3. Due to bladder management, bowel management, safety, skin/wound care, disease management, medication administration, pain management and patient education, does the patient require 24 hr/day rehab nursing? Yes 4. Does the patient require coordinated care of a physician, rehab nurse, PT (1-2 hrs/day, 5 days/week), OT (1-2 hrs/day, 5 days/week) and SLP (.5-1 hrs/day, 5 days/week) to address physical and functional deficits in the context of the above medical diagnosis(es)? Yes Addressing deficits in the following areas: balance, endurance, locomotion, strength, transferring, bowel/bladder control, bathing, dressing, feeding, grooming, toileting, cognition, speech, language, swallowing and psychosocial support 5. Can the patient actively participate in an intensive therapy program of at least 3 hrs of therapy per day at least 5 days per week? Yes 6. The potential for  patient to make measurable gains while on inpatient rehab is excellent 7. Anticipated functional outcomes upon discharge from inpatients are Sup PT, Sup OT, ModISLP 8. Estimated rehab length of stay to reach the above functional goals is: 12-16d 9. Does the patient have adequate social supports to accommodate these discharge functional goals? Yes and Potentially 10. Anticipated D/C setting: Home 11. Anticipated post D/C treatments: [place] therapy 12. Overall Rehab/Functional Prognosis: good  RECOMMENDATIONS: This patient's condition is appropriate for continued rehabilitative care in the following setting: CIR Patient has agreed to participate in recommended program. Yes Note that insurance prior authorization may be required for reimbursement for recommended care.  Comment: Scheduled for for TEE in a.m.   Charlett Blake 05/09/2017           Routing History

## 2017-05-12 NOTE — Progress Notes (Signed)
Patient information reviewed and entered into eRehab system by Bryton Waight, RN, CRRN, PPS Coordinator.  Information including medical coding and functional independence measure will be reviewed and updated through discharge.     Per nursing patient was given "Data Collection Information Summary for Patients in Inpatient Rehabilitation Facilities with attached "Privacy Act Statement-Health Care Records" upon admission.  

## 2017-05-12 NOTE — Progress Notes (Signed)
Subjective/Complaints: Patient is awake this morning his speech is dysarthric.  He denies any problems with sleep he denies any pain.  Systems denies chest pain shortness of breath nausea vomiting diarrhea constipation  Objective: Vital Signs: Blood pressure (!) 152/69, pulse 60, temperature 99 F (37.2 C), temperature source Oral, resp. rate 16, weight 96.2 kg (212 lb 1.8 oz), SpO2 93 %. Dg Chest 2 View  Result Date: 05/11/2017 CLINICAL DATA:  Productive cough, chest pain and shortness of breath. EXAM: CHEST - 2 VIEW COMPARISON:  Chest radiograph May 08, 2016 FINDINGS: Cardiomediastinal silhouette is normal. LEFT perihilar consolidation and diffuse interstitial prominence. Improved aeration LEFT lung base with cyst small pleural effusions. No pneumothorax. Single lumen RIGHT chest Port-A-Cath distal tip projects in proximal superior vena cava. No pneumothorax. Osteopenia. IMPRESSION: Persistent LEFT perihilar consolidation, possible pneumonia. Improved aeration LEFT lung base. Small pleural effusions. Diffuse interstitial prominence seen with atypical infection, potentially chronic. Electronically Signed   By: Elon Alas M.D.   On: 05/11/2017 19:54   Results for orders placed or performed during the hospital encounter of 05/11/17 (from the past 72 hour(s))  Glucose, capillary     Status: Abnormal   Collection Time: 05/11/17  4:38 PM  Result Value Ref Range   Glucose-Capillary 116 (H) 65 - 99 mg/dL  Glucose, capillary     Status: Abnormal   Collection Time: 05/11/17  9:12 PM  Result Value Ref Range   Glucose-Capillary 222 (H) 65 - 99 mg/dL  Glucose, capillary     Status: Abnormal   Collection Time: 05/12/17  6:25 AM  Result Value Ref Range   Glucose-Capillary 188 (H) 65 - 99 mg/dL  CBC WITH DIFFERENTIAL     Status: Abnormal   Collection Time: 05/12/17  7:53 AM  Result Value Ref Range   WBC 5.9 4.0 - 10.5 K/uL   RBC 3.45 (L) 4.22 - 5.81 MIL/uL   Hemoglobin 10.6 (L) 13.0 - 17.0  g/dL   HCT 33.1 (L) 39.0 - 52.0 %   MCV 95.9 78.0 - 100.0 fL   MCH 30.7 26.0 - 34.0 pg   MCHC 32.0 30.0 - 36.0 g/dL   RDW 13.3 11.5 - 15.5 %   Platelets 201 150 - 400 K/uL   Neutrophils Relative % 73 %   Neutro Abs 4.3 1.7 - 7.7 K/uL   Lymphocytes Relative 17 %   Lymphs Abs 1.0 0.7 - 4.0 K/uL   Monocytes Relative 7 %   Monocytes Absolute 0.4 0.1 - 1.0 K/uL   Eosinophils Relative 2 %   Eosinophils Absolute 0.1 0.0 - 0.7 K/uL   Basophils Relative 1 %   Basophils Absolute 0.0 0.0 - 0.1 K/uL    Comment: Performed at [facility], [address] [place], [place] [address]     HEENT: Right lower facial droop Cardio: RRR and No murmur Resp: CTA B/L and Unlabored GI: BS positive and Nontender nondistended Extremity:  Pulses positive and No Edema Skin:   Intact Neuro: Alert/Oriented, Abnormal Sensory Difficult to assess secondary communication, Abnormal Motor 4+ on the right side in the deltoid bicep tricep grip hip flexion knee extension ankle dorsiflexion, 5/5 in the left deltoid bicep tricep grip hip flexion knee extension ankle dorsiflexion, Dysarthric and Aphasic Musc/Skel:  Other No pain with upper limb or lower limb range of motion General no acute distress   Assessment/Plan: 1. Functional deficits secondary to left MCA infarct with right hemiparesis which require 3+ hours per day of interdisciplinary therapy in  a comprehensive inpatient rehab setting. Physiatrist is providing close team supervision and 24 hour management of active medical problems listed below. Physiatrist and rehab team continue to assess barriers to discharge/monitor patient progress toward functional and medical goals. FIM:                   Function - Comprehension Comprehension: Auditory Comprehension assist level: Understands basic 50 - 74% of the time/ requires cueing 25 - 49% of the time  Function - Expression Expression: Verbal Expression assist level: Expresses basic 50 - 74% of  the time/requires cueing 25 - 49% of the time. Needs to repeat parts of sentences.  Function - Social Interaction Social Interaction assist level: Interacts appropriately 50 - 74% of the time - May be physically or verbally inappropriate.  Function - Problem Solving Problem solving assist level: Solves basic 25 - 49% of the time - needs direction more than half the time to initiate, plan or complete simple activities     Medical Problem List and Plan:  1. Functional and cognitive deficits secondary to left MCA infarct  -CIR evals PT OT speech 2. DVT Prophylaxis/Anticoagulation: Pharmaceutical: Heparin. Order dopplers to rule out DVT due to PFO  3. Pain Management: tylenol prn  4. Mood: Appears depressed. Team to provide ego support. LCSW to follow for evaluation and support.  5. Neuropsych: This patient is not capable of making decisions on his own behalf.  6. Skin/Wound Care: routine pressure relief measures.  7. Fluids/Electrolytes/Nutrition: Monitor I/O. Intake 100% at lunch.  8. Hypotension: Will continue to monitor BP every 4 hours for 24 hours per wife's request and concerns. Will Continue IVF, midodrine and florinef for BP support.  9. Lethargy after sedation from TEE yesterday.  He is alert this morning able to follow commands 10. COPD: Continue Breo. Albuterol nebs prn.  Left perihilar density is likely from his lung CA.  There are small pleural effusions may discontinue IV fluids and monitor intake 11. T2DM: Hgb A1c- Off steriods. Will monitor BS ac/hs. Continue glipizide and trajenta with SSI for elevated BS.  CBG (last 3)  Recent Labs    05/11/17 1638 05/11/17 2112 05/12/17 0625  GLUCAP 116* 222* 188*   12. L-ICA stent: On ASA and plavix.     LOS (Days) 1 A FACE TO FACE EVALUATION WAS PERFORMED  Charlett Blake 05/12/2017, 9:00 AM

## 2017-05-12 NOTE — Plan of Care (Signed)
  Progressing Spiritual Needs Ability to function at adequate level 05/12/2017 1357 - Progressing by Brita Romp, RN Consults [facility] STROKE PATIENT EDUCATION Description See Patient Education module for education specifics  05/12/2017 1357 - Progressing by Brita Romp, RN Diabetes Guidelines if Diabetic/Glucose > 140 Description If diabetic or lab glucose is > 140 mg/dl - Initiate Diabetes/Hyperglycemia Guidelines & Document Interventions  05/12/2017 1357 - Progressing by Brita Romp, RN RH BOWEL ELIMINATION RH STG MANAGE BOWEL W/MEDICATION W/ASSISTANCE Description STG Manage Bowel with Medication with min Assistance.  05/12/2017 1357 - Progressing by Brita Romp, RN RH BLADDER ELIMINATION RH STG MANAGE BLADDER WITH ASSISTANCE Description STG Manage Bladder With min Assistance  05/12/2017 1357 - Progressing by Brita Romp, RN RH STG MANAGE BLADDER WITH EQUIPMENT WITH ASSISTANCE Description STG Manage Bladder With Equipment With min Assistance  05/12/2017 1357 - Progressing by Brita Romp, RN RH SKIN INTEGRITY RH STG SKIN FREE OF INFECTION/BREAKDOWN Description Patients skin will remain free from further infection or breakdown with min assist.  05/12/2017 1357 - Progressing by Brita Romp, RN RH STG MAINTAIN SKIN INTEGRITY WITH ASSISTANCE Description STG Maintain Skin Integrity With min Assistance.  05/12/2017 1357 - Progressing by Brita Romp, RN RH SAFETY RH STG ADHERE TO SAFETY PRECAUTIONS W/ASSISTANCE/DEVICE Description STG Adhere to Safety Precautions With min Assistance/Device.  05/12/2017 1357 - Progressing by Brita Romp, RN RH COGNITION-NURSING RH STG USES MEMORY AIDS/STRATEGIES W/ASSIST TO PROBLEM SOLVE Description STG Uses Memory Aids/Strategies With min Assistance to Problem Solve.  05/12/2017 1357 - Progressing by Brita Romp, RN   Not Progressing RH BOWEL ELIMINATION RH STG MANAGE BOWEL WITH  ASSISTANCE Description STG Manage Bowel with min Assistance.  05/12/2017 1357 - Not Progressing by Brita Romp, RN   Patient had large loose BM this morning.  Knew he had to go, but was unable to make it to toilet.  Has been having incontinent BMs since stroke per wife.  Brita Romp, RN

## 2017-05-12 NOTE — Evaluation (Signed)
Speech Language Pathology Assessment and Plan  Patient Details  Name: Edwin Jones MRN: [id number] Date of Birth: 20-Mar-1940  SLP Diagnosis: Dysarthria;Aphasia;Cognitive Impairments  Rehab Potential: Good ELOS: 2.5 weeks    Today's Date: 05/12/2017 SLP Individual Time: 1300-1400 SLP Individual Time Calculation (min): 60 min   Problem List:  Patient Active Problem List   Diagnosis Date Noted  . CKD (chronic kidney disease) 05/11/2017  . Infarction of left basal ganglia ([place]) 05/11/2017  . Idiopathic hypotension 05/09/2017  . Stroke ([place]) 05/06/2017  . Stroke (cerebrum) ([place]) 05/06/2017  . Acute ischemic left MCA stroke ([place]) 05/06/2017  . Cancer of upper lobe of left lung ([place]) 10/30/2016  . Pure hypercholesterolemia 06/22/2014  . GERD without esophagitis 06/22/2014  . Benign essential hypertension 06/22/2014  . Diabetes mellitus without complication ([place]) 32/99/2426   Past Medical History:  Past Medical History:  Diagnosis Date  . Cancer ([place])   . COPD (chronic obstructive pulmonary disease) ([place])   . Diabetes mellitus without complication ([place])   . GERD without esophagitis   . Hepatitis C virus    history of hep C treated with Harvoni  . Hypercholesterolemia   . Hypertension   . Seasonal allergies    Past Surgical History:  Past Surgical History:  Procedure Laterality Date  . COLONOSCOPY     approximately 11 years ago  . FLEXIBLE BRONCHOSCOPY N/A 11/05/2016   Procedure: FLEXIBLE BRONCHOSCOPY;  Surgeon: Wilhelmina Mcardle, MD;  Location: [facility] ORS;  Service: Pulmonary;  Laterality: N/A;  . IR FLUORO GUIDE CV LINE RIGHT  05/06/2017  . IR INTRAVSC STENT CERV CAROTID W/O EMB-PROT MOD SED INC ANGIO  05/06/2017  . IR PERCUTANEOUS ART THROMBECTOMY/INFUSION INTRACRANIAL INC DIAG ANGIO  05/06/2017  . IR US GUIDE VASC ACCESS LEFT  05/06/2017  . IR US GUIDE VASC ACCESS RIGHT  05/06/2017  . IR US GUIDE VASC ACCESS RIGHT  05/06/2017  . PORTA CATH INSERTION N/A 11/23/2016    Procedure: Glori Luis Cath Insertion;  Surgeon: Algernon Huxley, MD;  Location: [facility];  Service: Cardiovascular;  Laterality: N/A;  . RADIOLOGY WITH ANESTHESIA N/A 05/06/2017   Procedure: IR WITH ANESTHESIA;  Surgeon: Radiologist, Medication, MD;  Location: [facility];  Service: Radiology;  Laterality: N/A;  . TEE WITHOUT CARDIOVERSION N/A 05/10/2017   Procedure: TRANSESOPHAGEAL ECHOCARDIOGRAM (TEE);  Surgeon: Sanda Klein, MD;  Location: [facility];  Service: Cardiovascular;  Laterality: N/A;  . TONSILLECTOMY      Assessment / Plan / Recommendation Clinical Impression Edwin Jones is an 77 y.o. 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 He did not receive TPA secondary to concerns of potential brain metastasis. CTA brain done revealing demonstrating left M1 occlusion and patient underwent mechanical thrombectomy and ICA stenting. Follow up MRI/MRA brain showed restored patency of L-ICA and L-MCA, mild cytotoxic edema and left basal ganglia infarct, small infarct in left amygdala and small number of scatterd tiny cortical infarcts in L-MCA territory. He had problems with hypotension after ICA stenting was placed on Neo-Synephrine drip as well as albumin and stress dose steroids. He was oral midodrine and florinef for BP support. Dr. Erlinda Hong and Dr. Rogue Bussing felt that stroke was not related to hypercoagulable state but likely cardioembolic source therefore TEE done 3/4 and showed dilated left atrium negative for thrombus, NO PFO/ASD with very late bubbles in left atrium  consistent with pulmonic re-circulation and no source of cardiac or aortic emboli. He was started on ASA/Plavix for embolic stroke and loop recorder recommended to rule out A fib. Blood sugars have been poorly controlled and low dose Lantus recommended for tighter control. Patient with resultant  left sided weakness with shuffling gait with need for rest breaks, cognitive deficits and difficulty with ADL tasks. CIR recommended due to functional deficits on 05/11/17.   Bedside Swallow and Speech-langauge evaluations performed on 05/12/17. Pt presents with moderate expressive aphasia and mild receptive aphasia. Pt is able to select requested objects in field of 3, match word to objects from field of 3 objects, read phrase/sentence completion cards and match to object in field of 2, sequence 3 picture cards but requires Mod A sentence completion cues to effectivley name common objects with 50%. Pt demonstrates some initial self-monitoring but is unable to self-correct verbal errors. Pt also requires Max A cues to answer semi-complex ys/no questions, Max A cues to increase vocal intense to achieve ~ 75% intelligibility at the phrase level and Min A cues for selective attention as well as attention to his URE. Although pt exhibits mild right facial weakness, his speech intelligibility is compromised mostly from decreased vocal intensity. Pt consumed thin liquids via straw withoutovert s/s of aspiraiton therefore ST to sign off for dysphagia. Skilled ST is required to address the above mentioned deficits, increase functional independence and reduce caregiver burden. Anticipate that pt will need 24 hour supervision and Outpatient ST at discharge.    Skilled Therapeutic Interventions          Skilled treatment session focused on completion of BSE and SLE, see above. Wife present and extensive education discussed and written down regarding types of aphasia and specific deficits as related to pt. Pt benefits from sentence completion cues and wife is beginning to understand but continued education is needed.    SLP Assessment  Patient will need skilled Speech Lanaguage Pathology Services during CIR admission    Recommendations  SLP Diet Recommendations: Age appropriate regular solids;Thin Liquid Administration  via: Cup;Straw Medication Administration: Whole meds with liquid Supervision: Patient able to self feed Compensations: Minimize environmental distractions;Slow rate;Small sips/bites Postural Changes and/or Swallow Maneuvers: Seated upright 90 degrees Oral Care Recommendations: Oral care BID Patient destination: Home Follow up Recommendations: Outpatient SLP;24 hour supervision/assistance Equipment Recommended: None recommended by SLP    SLP Frequency 3 to 5 out of 7 days   SLP Duration  SLP Intensity  SLP Treatment/Interventions 2.5 weeks  Minumum of 1-2 x/day, 30 to 90 minutes  Cueing hierarchy;Functional tasks;Patient/family education;Therapeutic Activities;Speech/Language facilitation    Pain Pain Assessment Pain Assessment: No/denies pain Pain Score: 0-No pain  Prior Functioning Cognitive/Linguistic Baseline: Within functional limits Type of Home: House  Lives With: Spouse Available Help at Discharge: Family;Available 24 hours/day Vocation: Retired  Function:  Eating Eating   Modified Consistency Diet: No Eating Assist Level: Set up assist for;More than reasonable amount of time   Eating Set Up Assist For: Opening containers;Cutting food       Cognition Comprehension Comprehension assist level: Understands basic 75 - 89% of the time/ requires cueing 10 - 24% of the time  Expression   Expression assist level: Expresses basic 50 - 74% of the time/requires cueing 25 - 49% of the time. Needs to repeat parts of sentences.  Social Interaction Social Interaction assist level: Interacts appropriately 75 - 89% of the time - Needs redirection for appropriate language or to initiate interaction.  Problem Solving Problem solving assist level: Solves basic 75 - 89% of the time/requires cueing 10 - 24% of the time  Memory Memory assist level: Recognizes or recalls 50 - 74% of the time/requires cueing 25 - 49% of the time   Short Term Goals: Week 1: SLP Short Term Goal 1  (Week 1): Pt will utilize word finding strategies toname common objects with 95% accuracy and Min A cues.  SLP Short Term Goal 2 (Week 1): Pt will self-monitor and self-correct verbal responses with Mod A cues.  SLP Short Term Goal 3 (Week 1): Pt will demonstrate selective attention in moderately distracting environment for ~30 minutes with supervision cues.  SLP Short Term Goal 4 (Week 1): Pt will follow 2 step directions iwth 75% accuracy and Mod A cues.  SLP Short Term Goal 5 (Week 1): Pt will utilize speech intelligibility strategies (increased vocal intensity) to achieve ~ 50% intelligibility at the phrase level with Mod A cues.   Refer to Care Plan for Long Term Goals  Recommendations for other services: None   Discharge Criteria: Patient will be discharged from SLP if patient refuses treatment 3 consecutive times without medical reason, if treatment goals not met, if there is a change in medical status, if patient makes no progress towards goals or if patient is discharged from hospital.  The above assessment, treatment plan, treatment alternatives and goals were discussed and mutually agreed upon: by patient and by family  Fumie Fiallo 05/12/2017, 3:24 PM

## 2017-05-12 NOTE — Progress Notes (Signed)
Social Work   Herbert Aguinaldo, Eliezer Champagne  Social Worker  Physical Medicine and Rehabilitation  Patient Care Conference  Signed  Date of Service:  05/12/2017  2:36 PM          Signed          [] Hide copied text  [] Hover for details   Inpatient RehabilitationTeam Conference and Plan of Care Update Date: 05/12/2017   Time: 11:20 AM      Patient Name: Edwin Jones      Medical Record Number: [id number]  Date of Birth: 1941-01-21 Sex: Male         Room/Bed: 4W10C/4W10C-01 Payor Info: Payor: MEDICARE / Plan: MEDICARE PART A AND B / Product Type: *No Product type* /     Admitting Diagnosis: STROKE  CVA  Admit Date/Time:  05/11/2017  3:27 PM Admission Comments: No comment available    Primary Diagnosis:  <principal problem not specified> Principal Problem: <principal problem not specified>       Patient Active Problem List    Diagnosis Date Noted  . CKD (chronic kidney disease) 05/11/2017  . Infarction of left basal ganglia ([place]) 05/11/2017  . Idiopathic hypotension 05/09/2017  . Stroke ([place]) 05/06/2017  . Stroke (cerebrum) ([place]) 05/06/2017  . Acute ischemic left MCA stroke ([place]) 05/06/2017  . Cancer of upper lobe of left lung ([place]) 10/30/2016  . Pure hypercholesterolemia 06/22/2014  . GERD without esophagitis 06/22/2014  . Benign essential hypertension 06/22/2014  . Diabetes mellitus without complication ([place]) 62/95/2841      Expected Discharge Date:     Team Members Present: Physician leading conference: Dr. Alysia Penna Social Worker Present: Ovidio Kin, LCSW Nurse Present: Rayetta Pigg, RN OT Present: Cherylynn Ridges, OT SLP Present: Weston Anna, SLP PPS Coordinator present : Daiva Nakayama, RN, CRRN       Current Status/Progress Goal Weekly Team Focus  Medical       adjusting meds and checking labs   medical stable     Bowel/Bladder     Incontinent of B/B  Regain continence  Timed toileting   Swallow/Nutrition/ Hydration               ADL's    min A overall with delayed processing, expressive aphasia, memory deficits, R side incoordination  supervision overall  ADL training, balance, functional mobilty, RUE Coordination, cognitive activites, pt/ family education   Mobility     eval pending  eval pending  eval pending   Communication     eval pending  complete eval      Safety/Cognition/ Behavioral Observations   eval pending  complete eval      Pain     Denies Pain  Pain < 2  Assess Qshift and PRN   Skin     Skin intact  Prevent skin breakdown and infection  Assess qshift and PRN     *See Care Plan and progress notes for long and short-term goals.      Barriers to Discharge   Current Status/Progress Possible Resolutions Date Resolved   Physician                    Nursing                 PT                    OT                 SLP  SW              Discharge Planning/Teaching Needs:    Home with wife who can provide supervision level she has health issues of her own. She is here today and participating in his therapies, at times needs to sit down and relax.     Team Discussion:  New evaluation and setting goals in therapies. MD adjusting meds and checking labs.  Revisions to Treatment Plan:  New eval      Elease Hashimoto 05/12/2017, 2:36 PM                 Patient ID: Edwin Jones, male   DOB: 1940-04-03, 77 y.o.   MRN: [id number]

## 2017-05-12 NOTE — Progress Notes (Signed)
   05/12/17 1112  Clinical Encounter Type  Visited With Patient and family together  Visit Type Initial  Referral From Nurse  Consult/Referral To Chaplain  Spiritual Encounters  Spiritual Needs Emotional  Stress Factors  Family Stress Factors Health changes   Responded to a [facility] for information on a HCPA.  Patient was in the bed following some rehab and he was dosing on and off.  Spoke with the spouse who was at bedside.  She indicated they don't need to put it on paper but that she can make decisions for him when that time comes.  She shared about their life together and how important faith is in their lives.  She shared about how they met and they have been together over 30 years.  Relocated here to be near the son.  Will follow and support as needed. Chaplain Katherene Ponto

## 2017-05-12 NOTE — Evaluation (Signed)
Physical Therapy Assessment and Plan  Patient Details  Name: Edwin Jones MRN: [id number] Date of Birth: 28-Sep-1940  PT Diagnosis: Abnormal posture, Abnormality of gait, Hemiplegia dominant and Muscle weakness Rehab Potential: Good ELOS: 12-16days    Today's Date: 05/12/2017 PT Individual Time: 1400-1500 PT Individual Time Calculation (min): 60 min    Problem List:  Patient Active Problem List   Diagnosis Date Noted  . CKD (chronic kidney disease) 05/11/2017  . Infarction of left basal ganglia ([place]) 05/11/2017  . Idiopathic hypotension 05/09/2017  . Stroke ([place]) 05/06/2017  . Stroke (cerebrum) ([place]) 05/06/2017  . Acute ischemic left MCA stroke ([place]) 05/06/2017  . Cancer of upper lobe of left lung ([place]) 10/30/2016  . Pure hypercholesterolemia 06/22/2014  . GERD without esophagitis 06/22/2014  . Benign essential hypertension 06/22/2014  . Diabetes mellitus without complication ([place]) 62/83/6629    Past Medical History:  Past Medical History:  Diagnosis Date  . Cancer ([place])   . COPD (chronic obstructive pulmonary disease) ([place])   . Diabetes mellitus without complication ([place])   . GERD without esophagitis   . Hepatitis C virus    history of hep C treated with Harvoni  . Hypercholesterolemia   . Hypertension   . Seasonal allergies    Past Surgical History:  Past Surgical History:  Procedure Laterality Date  . COLONOSCOPY     approximately 11 years ago  . FLEXIBLE BRONCHOSCOPY N/A 11/05/2016   Procedure: FLEXIBLE BRONCHOSCOPY;  Surgeon: Wilhelmina Mcardle, MD;  Location: [facility] ORS;  Service: Pulmonary;  Laterality: N/A;  . IR FLUORO GUIDE CV LINE RIGHT  05/06/2017  . IR INTRAVSC STENT CERV CAROTID W/O EMB-PROT MOD SED INC ANGIO  05/06/2017  . IR PERCUTANEOUS ART THROMBECTOMY/INFUSION INTRACRANIAL INC DIAG ANGIO  05/06/2017  . IR US GUIDE VASC ACCESS LEFT  05/06/2017  . IR US GUIDE VASC ACCESS RIGHT  05/06/2017  . IR US GUIDE VASC ACCESS RIGHT  05/06/2017  . PORTA CATH INSERTION  N/A 11/23/2016   Procedure: Glori Luis Cath Insertion;  Surgeon: Algernon Huxley, MD;  Location: [facility];  Service: Cardiovascular;  Laterality: N/A;  . RADIOLOGY WITH ANESTHESIA N/A 05/06/2017   Procedure: IR WITH ANESTHESIA;  Surgeon: Radiologist, Medication, MD;  Location: [place];  Service: Radiology;  Laterality: N/A;  . TEE WITHOUT CARDIOVERSION N/A 05/10/2017   Procedure: TRANSESOPHAGEAL ECHOCARDIOGRAM (TEE);  Surgeon: Sanda Klein, MD;  Location: [facility];  Service: Cardiovascular;  Laterality: N/A;  . TONSILLECTOMY      Assessment & Plan Clinical Impression: Patient is a  77 y.o. 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 He did not receive TPA secondary to concerns of potential brain metastasis. CTA brain done revealing demonstrating left M1 occlusion and patient underwent mechanical thrombectomy and ICA stenting. Follow up MRI/MRA brain showed restored patency of L-ICA and L-MCA, mild cytotoxic edema and left basal ganglia infarct, small infarct in left amygdala and small number of scatterd tiny cortical infarcts in L-MCA territory. He had problems with hypotension after ICA stenting was placed on Neo-Synephrine drip as well as albumin and stress dose steroids. He was oral midodrine and florinef for BP support.  Dr. Erlinda Hong and Dr. Rogue Bussing felt that stroke was not related to hypercoagulable state but likely cardioembolic source therefore TEE done 3/4 and showed dilated left atrium negative for thrombus, NO PFO/ASD with  very late bubbles in left atrium consistent with pulmonic re-circulation and no source of cardiac or aortic emboli. He was started on ASA/Plavix for embolic stroke and loop recorder recommended to rule out A fib. Blood sugars have been poorly controlled and low dose Lantus recommended for tighter control.    Patient transferred to  [facility] on 05/11/2017 .   Patient currently requires min with mobility secondary to muscle weakness, decreased cardiorespiratoy endurance, unbalanced muscle activation, decreased coordination and decreased motor planning, decreased attention to right and ideational apraxia, decreased initiation, decreased attention, decreased awareness, decreased problem solving, decreased safety awareness and delayed processing and decreased standing balance, decreased postural control, hemiplegia and decreased balance strategies.  Prior to hospitalization, patient was independent  with mobility and lived with Significant other in a House home.  Home access is 2Stairs to enter.  Patient will benefit from skilled PT intervention to maximize safe functional mobility, minimize fall risk and decrease caregiver burden for planned discharge home with intermittent assist.  Anticipate patient will benefit from follow up [facility] at discharge.  PT - End of Session Endurance Deficit: Yes PT Assessment Rehab Potential (ACUTE/IP ONLY): Good PT Barriers to Discharge: Inaccessible home environment;Decreased caregiver support PT Patient demonstrates impairments in the following area(s): Balance;Behavior;Edema;Endurance;Nutrition;Motor;Pain;Perception;Safety;Sensory;Skin Integrity PT Transfers Functional Problem(s): Bed Mobility;Bed to Chair;Car;Furniture;Floor PT Locomotion Functional Problem(s): Ambulation;Wheelchair Mobility;Stairs PT Plan PT Intensity: Minimum of 1-2 x/day ,45 to 90 minutes PT Frequency: 5 out of 7 days PT Duration Estimated Length of Stay: 12-16days  PT Treatment/Interventions: Ambulation/gait training;Balance/vestibular training;Cognitive remediation/compensation;Community reintegration;Discharge planning;Functional electrical stimulation;Neuromuscular re-education;DME/adaptive equipment instruction;Disease management/prevention;Functional mobility training;Pain management;Patient/family education;Psychosocial  support;Skin care/wound management;Stair training;Splinting/orthotics;Therapeutic Activities;Therapeutic Exercise;UE/LE Strength taining/ROM;UE/LE Coordination activities;Visual/perceptual remediation/compensation;Wheelchair propulsion/positioning PT Transfers Anticipated Outcome(s): Mod I with LRAD  PT Locomotion Anticipated Outcome(s): ambulatory at supervision level  PT Recommendation Recommendations for Other Services: Therapeutic Recreation consult Therapeutic Recreation Interventions: Outing/community reintergration Follow Up Recommendations: Home health PT Patient destination: Home Equipment Recommended: To be determined;Cane  Skilled Therapeutic Intervention  PT instructed patient in PT Evaluation and initiated treatment intervention; see below for results. PT educated patient in [place], rehab potential, rehab goals, and discharge recommendations. Pt returned to room and performed stand pivot transfer to bed with min assist. Sit>supine completed with supervision assist, and left supine in bed with call bell in reach and all needs met.     PT Evaluation Precautions/Restrictions Precautions Precautions: Fall Precaution Comments: low blood pressure Restrictions Weight Bearing Restrictions: No General   Vital Signs Pain   denies  Home Living/Prior Functioning Home Living Available Help at Discharge: Family;Available 24 hours/day Type of Home: House Home Access: Stairs to enter [employer] of Steps: 2 Entrance Stairs-Rails: Right;Left Home Layout: One level Bathroom Shower/Tub: Multimedia programmer: Standard  Lives With: Significant other Prior Function Level of Independence: Independent with basic ADLs;Independent with homemaking with ambulation;Independent with gait;Independent with transfers  Able to Take Stairs?: Yes Driving: Yes Vocation: Retired Comments: active, helps at [employer], fixes vacuums Vision/Perception  Vision - Assessment Eye  Alignment: Within Functional Limits Ocular Range of Motion: Within Functional Limits Alignment/Gaze Preference: Within Defined Limits Tracking/Visual Pursuits: Able to track stimulus in all quads without difficulty Perception Perception: Impaired Inattention/Neglect: Does not attend to right visual field Praxis Praxis: Intact  Cognition Overall Cognitive Status: Impaired/Different from baseline Arousal/Alertness: Awake/alert Sustained Attention: Appears intact Memory: Impaired Memory Impairment: Decreased recall of new information;Decreased short term memory;Retrieval deficit Decreased Short Term Memory: Verbal basic;Functional basic Awareness: Impaired Awareness Impairment: Intellectual impairment Problem Solving: Impaired Problem  Solving Impairment: Verbal basic;Functional basic Comments: delayed processing Sensation Sensation Light Touch: Appears Intact Stereognosis: Not tested Hot/Cold: Appears Intact Proprioception: Appears Intact Coordination Gross Motor Movements are Fluid and Coordinated: No Fine Motor Movements are Fluid and Coordinated: No Coordination and Movement Description: shuffling gait with small steps, delayed RUE coordination, cues to use R hand in dressing Finger Nose Finger Test: L - 6x in 10 sec, R - 5x in 10 sec with fair accuracy Motor  Motor Motor - Skilled Clinical Observations: UE strength WFL, LE demonstrated weakness with sit to stand, pt had difficulty fully extending legs in standing when he became fatigued  Mobility Bed Mobility Bed Mobility: Rolling Right;Rolling Left;Sit to Supine;Supine to Sit Rolling Right: 5: Supervision Rolling Right Details (indicate cue type and reason): with rails  Rolling Left: 5: Supervision Rolling Left Details (indicate cue type and reason): with rails  Supine to Sit: 5: Supervision Supine to Sit Details (indicate cue type and reason): with rails  Sit to Supine: 5: Supervision Sit to Supine - Details (indicate  cue type and reason): with rails  Transfers Transfers: Yes Sit to Stand: 4: Min assist Stand Pivot Transfers: 4: Min assist Stand Pivot Transfer Details: Verbal cues for gait pattern;Verbal cues for safe use of DME/AE  Car transfer: min assist verbal cues for safety.  Locomotion  Ambulation Ambulation: Yes Ambulation/Gait Assistance: 4: Min assist Ambulation Distance (Feet): 150 Feet Assistive device: None Gait Gait: Yes Gait Pattern: Decreased step length - right;Poor foot clearance - right;Shuffle Stairs / Additional Locomotion Stairs: Yes Stairs Assistance: 4: Min assist Stair Management Technique: Two rails Number of Stairs: 4 Wheelchair Mobility Wheelchair Mobility: Yes Wheelchair Assistance: 3: Mod Lexicographer: Both upper extremities Distance: 131f   Trunk/Postural Assessment  Cervical Assessment Cervical Assessment: Within FWater engineerThoracic Assessment: Within Functional Limits Lumbar Assessment Lumbar Assessment: Within Functional Limits Postural Control Postural Control: Deficits on evaluation(pt holds a forward flexed (hips) posture in standing)  Balance Static Sitting Balance Static Sitting - Level of Assistance: 5: Stand by assistance Dynamic Sitting Balance Dynamic Sitting - Level of Assistance: 4: Min assist(with reaching towards his feet) Static Standing Balance Static Standing - Level of Assistance: 5: Stand by assistance Dynamic Standing Balance Dynamic Standing - Level of Assistance: 3: Mod assist Extremity Assessment  RUE Assessment RUE Assessment: Within Functional Limits(strength is WFL, PROM of shoulder 120, -30 of tricep extension PROM) LUE Assessment LUE Assessment: Within Functional Limits RLE Assessment RLE Assessment: Exceptions to WCook Medical CenterRLE Strength RLE Overall Strength Comments: hip flexion and adduction 4/5 hip abdution 4+/5. knee flexion and extension 4+/5. ankle DF 4+/5.  LLE  Assessment LLE Assessment: Within Functional Limits   See Function Navigator for Current Functional Status.   Refer to Care Plan for Long Term Goals  Recommendations for other services: Therapeutic Recreation  Outing/community reintegration  Discharge Criteria: Patient will be discharged from PT if patient refuses treatment 3 consecutive times without medical reason, if treatment goals not met, if there is a change in medical status, if patient makes no progress towards goals or if patient is discharged from hospital.  The above assessment, treatment plan, treatment alternatives and goals were discussed and mutually agreed upon: by patient and by family  ALorie Phenix3/08/2017, 2:03 PM

## 2017-05-12 NOTE — Progress Notes (Signed)
PMR Admission Coordinator Pre-Admission Assessment  Patient: Edwin Jones is an 77 y.o., male MRN: [id number] DOB: May 02, 1940 Height: 6\' 3"  (190.5 cm) Weight: 96.2 kg (212 lb 1.3 oz)                                                                                                                                                  Insurance Information HMO:     PPO:      PCP:      IPA:      80/20:      OTHER:  PRIMARY: Medicare A & B      Policy#: [id number]      Subscriber: Self CM Name:       Phone#:      Fax#:  Pre-Cert#: Eligible       Employer: Retired Benefits:  Phone #: Verified online     Name: [employer]  Eff. Date: A:01/07/05 B:09/06/09     Deduct: $1364      Out of Pocket Max: N/A      Life Max: N/A CIR: 100%      SNF: 100% days 1-20; 80% days 21-100 Outpatient: 80%     Co-Pay: 20% Home Health: 100%      Co-Pay: $0 DME: 80%     Co-Pay: 20% Providers: Patient's Choice   SECONDARY: None        Medicaid Application Date:       Case Manager:  Disability Application Date:       Case Worker:   Emergency Contact Information        Contact Information    Name Relation Home Work Mobile   Mauceri,[name] Spouse [phone]   Otho Ket   [phone]     Current Medical History  Patient Admitting Diagnosis: 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 He did not receive TPA secondary to concerns of potential brain metastasis. Patient was evaluated by neurology, MRI demonstrating left M1 occlusion and patient underwent mechanical thrombectomy and ICA stenting. Conversations between oncology and neurology  suggested that the stroke was not related to hypercoagulable state but likely cardioembolic source and therefore TEE was ordered after transthoracic echocardiogram did not demonstrate embolic source. Patient was on aspirin 81 mg prior to admission, placed on Integrilin but this was switched to Plavix. Patient had problems with hypotension after ICA stenting was placed on Neo-Synephrine drip and then switched to oral midodrine. Patient is a diabetic and he has been started on Tradjenta as well as Glucotrol. Physical therapy evaluation 05/07/2017, mod assist with transfers mod assist 15  feet with rolling walker. Speech therapy evaluation 05/06/2017, regular diet with thin liquids recommended small sips with upright seating. Language evaluation by speech therapy on 05/06/2017 demonstrated moderate expressive aphasia with poor ability to self-correct. TEE completed 05/10/17 with findings of dilated left atrium, normal appendage velocities, no thrombus. No PFO/ASD - very late saline bubbles are seen in the left atrium, consistent with pulmonic re-circulation. Almost no plaque in the aorta; Normal TEE, no cardiac or aortic embolic source.  Patient declined loop recorder placement with plans for an outpatient, 30-day cardiac monitor to be placed after IP Rehab.  Patient admitted to [facility] comprehensive inpatient rehabilitation program 05/11/17.       NIH Total: 4  Past Medical History      Past Medical History:  Diagnosis Date  . Cancer ([place])   . COPD (chronic obstructive pulmonary disease) ([place])   . Diabetes mellitus without complication ([place])   . GERD without esophagitis   . Hepatitis C virus    history of hep C treated with Harvoni  . Hypercholesterolemia   . Hypertension   . Seasonal allergies     Family History  family history includes Lung cancer (age of onset: 71) in his sister; Ovarian cancer in his sister; Skin cancer in his mother and sister; Throat cancer (age of onset: 65) in  his brother; Throat cancer (age of onset: 28) in his brother.  Prior Rehab/Hospitalizations:  Has the patient had major surgery during 100 days prior to admission? No  Current Medications   Current Facility-Administered Medications:  .   stroke: mapping our early stages of recovery book, , Does not apply, Once, Kerney Elbe, MD .  0.9 %  sodium chloride infusion, , Intravenous, Continuous, Rinehuls, Early Chars, PA-C, Last Rate: 50 mL/hr at 05/11/17 0202 .  acetaminophen (TYLENOL) tablet 650 mg, 650 mg, Oral, Q4H PRN, 650 mg at 05/10/17 2146 **OR** acetaminophen (TYLENOL) solution 650 mg, 650 mg, Per Tube, Q4H PRN **OR** acetaminophen (TYLENOL) suppository 650 mg, 650 mg, Rectal, Q4H PRN, Kerney Elbe, MD .  acyclovir ointment (ZOVIRAX) 5 %, , Topical, TID, Rinehuls, David L, PA-C .  albuterol (PROVENTIL) (2.5 MG/3ML) 0.083% nebulizer solution 3 mL, 3 mL, Inhalation, Q6H PRN, Kerney Elbe, MD .  aspirin tablet 325 mg, 325 mg, Oral, Daily, 325 mg at 05/11/17 0812 **OR** [DISCONTINUED] aspirin chewable tablet 324 mg, 324 mg, Per Tube, Daily, Corrie Mckusick, DO .  Chlorhexidine Gluconate Cloth 2 % PADS 6 each, 6 each, Topical, Daily, Rosalin Hawking, MD, 6 each at 05/11/17 0813 .  clopidogrel (PLAVIX) tablet 75 mg, 75 mg, Oral, Daily, Tyrone Apple, RPH, 75 mg at 05/11/17 6237 .  fludrocortisone (FLORINEF) tablet 0.1 mg, 0.1 mg, Oral, Daily, Garvin Fila, MD, 0.1 mg at 05/11/17 6283 .  fluticasone furoate-vilanterol (BREO ELLIPTA) 200-25 MCG/INH 1 puff, 1 puff, Inhalation, Daily, Kerney Elbe, MD, 1 puff at 05/11/17 0919 .  glipiZIDE (GLUCOTROL) tablet 10 mg, 10 mg, Oral, Daily, Rosalin Hawking, MD, 10 mg at 05/11/17 1517 .  heparin injection 5,000 Units, 5,000 Units, Subcutaneous, Q8H, Rosalin Hawking, MD, 5,000 Units at 05/11/17 [phone] .  insulin aspart (novoLOG) injection 0-15 Units, 0-15 Units, Subcutaneous, TID WC, Kerney Elbe, MD, 5 Units at 05/11/17 [phone] .  insulin aspart (novoLOG) injection 0-5  Units, 0-5 Units, Subcutaneous, QHS, Kerney Elbe, MD, 2 Units at 05/10/17 2201 .  insulin aspart (novoLOG) injection 4 Units, 4 Units, Subcutaneous, TID WC, Kerney Elbe, MD, 4 Units at 05/11/17 [phone] .  linagliptin (TRADJENTA)  tablet 5 mg, 5 mg, Oral, Daily, Rosalin Hawking, MD, 5 mg at 05/11/17 0813 .  methylPREDNISolone sodium succinate (SOLU-MEDROL) 125 mg/2 mL injection 125 mg, 125 mg, Intravenous, Once, Kerney Elbe, MD .  midodrine (PROAMATINE) tablet 5 mg, 5 mg, Oral, TID WC, Rosalin Hawking, MD, 5 mg at 05/11/17 0811 .  nystatin (MYCOSTATIN) 100000 UNIT/ML suspension 500,000 Units, 5 mL, Mouth/Throat, QID, Biby, Sharon L, NP, 500,000 Units at 05/11/17 [phone] .  ondansetron (ZOFRAN) injection 4 mg, 4 mg, Intravenous, Q6H PRN, Corrie Mckusick, DO .  senna-docusate (Senokot-S) tablet 1 tablet, 1 tablet, Oral, QHS PRN, Kerney Elbe, MD .  sodium chloride flush (NS) 0.9 % injection 10-40 mL, 10-40 mL, Intracatheter, PRN, Rosalin Hawking, MD  Patients Current Diet: Fall precautions Diet heart healthy/carb modified Room service appropriate? 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  He wasactive around the house, repairs vacuums, and an active church member.    Home Assistive Devices / Equipment Home Assistive Devices/Equipment: Blood pressure cuff, CBG Meter, Dentures (specify type) Home Equipment: None  Prior Device Use: Indicate devices/aids used by the patient prior to current illness, exacerbation or injury? None of the above  Prior Functional Level Prior Function Level of Independence: Independent Comments: does all his ADL's and drives  Self Care: Did the patient need help  bathing, dressing, using the toilet or eating? Independent  Indoor Mobility: Did the patient need assistance with walking from room to room (with or without device)? Independent  Stairs: Did the patient need assistance with internal or external stairs (with or without device)? Independent  Functional Cognition: Did the patient need help planning regular tasks such as shopping or remembering to take medications? Independent  Current Functional Level Cognition  Arousal/Alertness: Awake/alert Overall Cognitive Status: Impaired/Different from baseline Difficult to assess due to: Impaired communication Current Attention Level: Sustained Orientation Level: Oriented X4 Following Commands: Follows multi-step commands inconsistently Safety/Judgement: Decreased awareness of safety, Decreased awareness of deficits General Comments: Pt not able to state his or wife's bday without contextual cues. Flat affect throughout with some appropriate smiling. Increased time to perform tasks. Per wife, more verbal today but sitill confused not close to baseline.  Attention: Sustained Sustained Attention: Appears intact    Extremity Assessment (includes Sensation/Coordination)  Upper Extremity Assessment: RUE deficits/detail RUE Deficits / Details: Decreased grasp strength and poor cooridnation. required increased cues, time, and effort for finger opposition. undershooting during finger-to-nose test presenting with dysmetria.  RUE Coordination: decreased fine motor, decreased gross motor  Lower Extremity Assessment: Defer to PT evaluation RLE Deficits / Details: R LE weakened, moves syngergistically with some isolation.  Gross extension 4-/5 RLE Coordination: decreased fine motor LLE Deficits / Details: WFL    ADLs  Overall ADL's : Needs assistance/impaired Grooming: Wash/dry hands, Set up, Sitting Grooming Details (indicate cue type and reason): Pt using hand sanitizer after toileting. required  cues for sequencing and intiating task Upper Body Bathing: Minimal assistance, Sitting Lower Body Bathing: Minimal assistance, Sit to/from stand Upper Body Dressing : Minimal assistance, Sitting Lower Body Dressing: Minimal assistance, Sit to/from stand Lower Body Dressing Details (indicate cue type and reason): Pt able to reach forward and adjust socks at EOB. Min A for standing balance  Toilet Transfer: Minimal assistance, +2 for safety/equipment, Ambulation, RW(Simulated to recliner) Toileting- Clothing Manipulation and Hygiene: Minimal assistance, Sit to/from stand Toileting - Clothing Manipulation Details (indicate cue type and reason): Pt performing toilet hygiene after bowl incontinence in bed. Pt requiring Min A to make sure he was completely clean.  Functional mobility during ADLs: Min guard, Rolling walker General ADL Comments: Pt demonstrating decreased fucntional performance. Pt with poor cognition, balance, and fucnitonal use of RUE.    Mobility  Overal bed mobility: Needs Assistance Bed Mobility: Supine to Sit Supine to sit: Min guard, HOB elevated General bed mobility comments: Min Guard for safety; no assist needed.     Transfers  Overall transfer level: Needs assistance Equipment used: None Transfers: Sit to/from Stand Sit to Stand: Mod assist General transfer comment: Assist to power to standing with pt reaching for UE assist on IV pole, slow to rise with cues for extension throughout. Transferred to chair post ambulation.    Ambulation / Gait / Stairs / Wheelchair Mobility  Ambulation/Gait Ambulation/Gait assistance: Min assist, +2 safety/equipment Ambulation Distance (Feet): 120 Feet Assistive device: None Gait Pattern/deviations: Step-to pattern, Step-through pattern, Decreased step length - right, Decreased step length - left, Decreased stance time - left, Decreased stride length, Shuffle, Narrow base of support General Gait Details: Slow, shuffling like gait  pattern with decreased step length bilaterally; decreased stance time LLE and 2 standing rest breaks due to fatigue. Min A for balance, slight right lateral lean.  Gait velocity: decreased    Posture / Balance Balance Overall balance assessment: Needs assistance Sitting-balance support: Feet supported, No upper extremity supported Sitting balance-Leahy Scale: Fair Standing balance support: During functional activity Standing balance-Leahy Scale: Fair Standing balance comment: reliant on external support    Special needs/care consideration BiPAP/CPAP: No CPM: No Continuous Drip IV: No Dialysis: No         Life Vest: No Oxygen: No Special Bed: No Trach Size: No Wound Vac (area): No       Skin: WDL                              Bowel mgmt: Incontinent, last BM 05/10/17 Bladder mgmt: Intermittent incontinence with external foley  Diabetic mgmt: Yes, [id number] took oral medications at home PTA        Previous Home Environment Living Arrangements: Spouse/significant other Available Help at Discharge: Family, Available 24 hours/day Type of Home: House Home Layout: One level Home Access: Stairs to enter Entrance Stairs-Rails: Right, Left Entrance Stairs-Number of Steps: 2 Bathroom Shower/Tub: Multimedia programmer: Standard Home Care Services: No  Discharge Living Setting Plans for Discharge Living Setting: Lives with (comment)(Spouse, rents a house ) Type of Home at Discharge: House Discharge Home Layout: One level Discharge Home Access: Stairs to enter Entrance Stairs-Rails: Right Entrance Stairs-Number of Steps: 2 Discharge Bathroom Shower/Tub: Tub/shower unit, Curtain Discharge Bathroom Toilet: Standard Discharge Bathroom Accessibility: Yes How Accessible: Accessible via walker Does the patient have any problems obtaining your medications?: No  Social/Family/Support Systems Patient Roles: Spouse, Parent, Other (Comment)(Church Deacon ) Contact  Information: Spouse: Chemical engineer  Anticipated Caregiver: Spouse Anticipated Caregiver's Contact Information: Cell: [phone] Ability/Limitations of Caregiver: None Caregiver Availability: 24/7 Discharge Plan Discussed with Primary Caregiver: Yes Is Caregiver In Agreement with Plan?: Yes Does Caregiver/Family have Issues with Lodging/Transportation while Pt is in Rehab?: No  Goals/Additional Needs Patient/Family Goal for Rehab: PT/OT: Supervision; SLP: Mod I  Expected length of  stay: 12-16 days  Cultural Considerations: Baptist, active as a Deacon in [employer]  Dietary Needs: Carb. Mod. and Heart Healthy diet restrictions  Equipment Needs: TBD Special Service Needs: None Pt/Family Agrees to Admission and willing to participate: Yes Program Orientation Provided & Reviewed with Pt/Caregiver Including Roles  & Responsibilities: Yes Additional Information Needs: Spouse with questions about equip. and home modifications  Information Needs to be Provided By: Team  Barriers to Discharge: Incontinence  Decrease burden of Care through IP rehab admission: No  Possible need for SNF placement upon discharge: No  Patient Condition: This patient's medical and functional status has changed since the consult dated: 05/09/17 at 11:32am in which the Rehabilitation Physician determined and documented that the patient's condition is appropriate for intensive rehabilitative care in an inpatient rehabilitation facility. See "History of Present Illness" (above) for medical update. Functional changes are: Mod A transfers and Min A +2 120 feet. Patient's medical and functional status update has been discussed with the Rehabilitation physician and patient remains appropriate for inpatient rehabilitation. Will admit to inpatient rehab today.  Preadmission Screen Completed By:  Gunnar Fusi, 05/11/2017 11:35 AM ______________________________________________________________________   Discussed status with  Dr. Naaman Plummer on 05/11/17 at 49 and received telephone approval for admission today.  Admission Coordinator:  Gunnar Fusi, time 1210/Date 05/11/17             Cosigned by: Meredith Staggers, MD at 05/11/2017 1:14 PM  Revision History

## 2017-05-12 NOTE — Evaluation (Signed)
Occupational Therapy Assessment and Plan  Patient Details  Name: Edwin Jones MRN: [id number] Date of Birth: [birth year]  OT Diagnosis: abnormal posture, cognitive deficits and hemiplegia affecting dominant side Rehab Potential: Rehab Potential (ACUTE ONLY): Good ELOS: 18-19 days   Today's Date: 05/12/2017 OT Individual Time: 4081-4481 OT Individual Time Calculation (min): 75 min     Problem List:  Patient Active Problem List   Diagnosis Date Noted  . CKD (chronic kidney disease) 05/11/2017  . Infarction of left basal ganglia ([place]) 05/11/2017  . Idiopathic hypotension 05/09/2017  . Stroke ([place]) 05/06/2017  . Stroke (cerebrum) ([place]) 05/06/2017  . Acute ischemic left MCA stroke ([place]) 05/06/2017  . Cancer of upper lobe of left lung ([place]) 10/30/2016  . Pure hypercholesterolemia 06/22/2014  . GERD without esophagitis 06/22/2014  . Benign essential hypertension 06/22/2014  . Diabetes mellitus without complication ([place]) 85/63/1497    Past Medical History:  Past Medical History:  Diagnosis Date  . Cancer ([place])   . COPD (chronic obstructive pulmonary disease) ([place])   . Diabetes mellitus without complication ([place])   . GERD without esophagitis   . Hepatitis C virus    history of hep C treated with Harvoni  . Hypercholesterolemia   . Hypertension   . Seasonal allergies    Past Surgical History:  Past Surgical History:  Procedure Laterality Date  . COLONOSCOPY     approximately 11 years ago  . FLEXIBLE BRONCHOSCOPY N/A 11/05/2016   Procedure: FLEXIBLE BRONCHOSCOPY;  Surgeon: Wilhelmina Mcardle, MD;  Location: [facility] ORS;  Service: Pulmonary;  Laterality: N/A;  . IR FLUORO GUIDE CV LINE RIGHT  05/06/2017  . IR INTRAVSC STENT CERV CAROTID W/O EMB-PROT MOD SED INC ANGIO  05/06/2017  . IR PERCUTANEOUS ART THROMBECTOMY/INFUSION INTRACRANIAL INC DIAG ANGIO  05/06/2017  . IR US GUIDE VASC ACCESS LEFT  05/06/2017  . IR US GUIDE VASC ACCESS RIGHT  05/06/2017  . IR US GUIDE VASC ACCESS RIGHT   05/06/2017  . PORTA CATH INSERTION N/A 11/23/2016   Procedure: Glori Luis Cath Insertion;  Surgeon: Algernon Huxley, MD;  Location: [place] [facility];  Service: Cardiovascular;  Laterality: N/A;  . RADIOLOGY WITH ANESTHESIA N/A 05/06/2017   Procedure: IR WITH ANESTHESIA;  Surgeon: Radiologist, Medication, MD;  Location: [place];  Service: Radiology;  Laterality: N/A;  . TEE WITHOUT CARDIOVERSION N/A 05/10/2017   Procedure: TRANSESOPHAGEAL ECHOCARDIOGRAM (TEE);  Surgeon: Sanda Klein, MD;  [facility];  Service: Cardiovascular;  Laterality: N/A;  . TONSILLECTOMY      Assessment & Plan Clinical Impression: Edwin Jones is an 77 y.o. 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 He did not receive TPA secondary to concerns of potential brain metastasis. CTA brain done revealing demonstrating left M1 occlusion and patient underwent mechanical thrombectomy and ICA stenting. Follow up MRI/MRA brain showed restored patency of L-ICA and L-MCA, mild cytotoxic edema and left basal ganglia infarct, small infarct in left amygdala and small number of scatterd tiny cortical infarcts in L-MCA territory. He had problems with hypotension after ICA stenting was placed on Neo-Synephrine drip as well as albumin and stress dose steroids. He was oral midodrine and florinef for BP support.  Dr. Erlinda Hong and Dr. Rogue Bussing felt that stroke was not related to hypercoagulable state but likely cardioembolic source therefore TEE done 3/4 and showed dilated left atrium negative for  thrombus, NO PFO/ASD with very late bubbles in left atrium consistent with pulmonic re-circulation and no source of cardiac or aortic emboli. He was started on ASA/Plavix for embolic stroke and loop recorder recommended to rule out A fib. Blood sugars have been poorly controlled and low dose Lantus recommended for  tighter control. Patient with resultant left sided weakness with shuffling gait with need for rest breaks, cognitive deficits and difficulty with ADL tasks. CIR recommended due to functional deficits.    Patient transferred to CIR on 05/11/2017 .    Patient currently requires min - mod with basic self-care skills secondary to decreased cardiorespiratoy endurance, decreased coordination, decreased attention to right, decreased awareness, decreased problem solving, decreased memory and delayed processing and decreased sitting balance, decreased standing balance, decreased postural control, hemiplegia and decreased balance strategies.  Prior to hospitalization, patient was fully independent and active, driving.  Patient will benefit from skilled intervention to increase independence with basic self-care skills prior to discharge home with care partner.  Anticipate patient will require 24 hour supervision and follow up home health.  OT - End of Session Activity Tolerance: Tolerates 10 - 20 min activity with multiple rests(blood pressure decreased) Endurance Deficit: Yes OT Assessment Rehab Potential (ACUTE ONLY): Good OT Patient demonstrates impairments in the following area(s): Balance;Cognition;Endurance;Motor;Perception;Safety OT Basic ADL's Functional Problem(s): Eating;Grooming;Bathing;Dressing;Toileting OT Transfers Functional Problem(s): Toilet;Tub/Shower OT Additional Impairment(s): Fuctional Use of Upper Extremity OT Plan OT Intensity: Minimum of 1-2 x/day, 45 to 90 minutes OT Frequency: 5 out of 7 days OT Duration/Estimated Length of Stay: 18-19 days OT Treatment/Interventions: Balance/vestibular training;Cognitive remediation/compensation;Discharge planning;DME/adaptive equipment instruction;Functional mobility training;Neuromuscular re-education;Patient/family education;Psychosocial support;Self Care/advanced ADL retraining;Therapeutic Activities;Therapeutic Exercise;UE/LE Strength  taining/ROM;UE/LE Coordination activities;Visual/perceptual remediation/compensation OT Self Feeding Anticipated Outcome(s): mod I OT Basic Self-Care Anticipated Outcome(s): supervision OT Toileting Anticipated Outcome(s): supervision OT Bathroom Transfers Anticipated Outcome(s): supervision OT Recommendation Patient destination: Home Follow Up Recommendations: Home health OT Equipment Recommended: To be determined   Skilled Therapeutic Intervention Pt seen for initial evaluation and ADL training with a focus on balance and R side coordination.  Pt worked on sitting and standing balance initially and then stated he needed to toilet immediately.  Pt ambulated to toilet with HHA with a slow shuffling gait pattern. He became incontinent of very liquidy stool as he was walking.  Pt got to the toilet and his brief fell so there was a considerable mess.  As a result a shower was needed. Pt needed A with cleansing and mod assist to sit to stand from low toilet.  He stepped into shower with mod cues for stepping all the way in.  The seat was higher so he only needed steadying A.  Pt bathed most of his body himself demonstrating good initiation.  With dressing, pt donned shirt without physical A but needed mod cues due to R inattention and slow processing. It took pt almost 5 min to don his sweatshirt.   After dressing, pt very fatigued and transferred to the bed.  RN present to take his vitals and help him lay down. Reviewed with pt and his wife ELOS and goals and OT POC.   OT Evaluation Precautions/Restrictions  Precautions Precautions: Fall Precaution Comments: low blood pressure Restrictions Weight Bearing Restrictions: No    Vital Signs Therapy Vitals Pulse Rate: 68 BP: (!) 118/54 Patient Position (if appropriate): Lying Pain Pain Assessment Pain Assessment: 0-10 Pain Score: 0-No pain Home Living/Prior Functioning Home Living Family/patient expects to be discharged to:: Private  residence Living Arrangements: Spouse/significant  other Available Help at Discharge: Family, Available 24 hours/day Type of Home: House Home Access: Stairs to enter [employer] of Steps: 2 Entrance Stairs-Rails: Right, Left Home Layout: One level Bathroom Shower/Tub: Multimedia programmer: Standard  Lives With: Significant other Prior Function Level of Independence: Independent with basic ADLs, Independent with homemaking with ambulation, Independent with gait, Independent with transfers  Able to Take Stairs?: Yes Driving: Yes Vocation: Retired Comments: active, helps at [employer], fixes vacuums ADL ADL ADL Comments: refer to functional navigator Vision Baseline Vision/History: Wears glasses Wears Glasses: Reading only Patient Visual Report: No change from baseline Eye Alignment: Within Functional Limits Ocular Range of Motion: Within Functional Limits Alignment/Gaze Preference: Within Defined Limits Tracking/Visual Pursuits: Able to track stimulus in all quads without difficulty Perception  Perception: Impaired Inattention/Neglect: Does not attend to right visual field Praxis Praxis: Intact Cognition Overall Cognitive Status: Impaired/Different from baseline Arousal/Alertness: Awake/alert Orientation Level: Person;Place Year: 2020 Month: March Day of Week: Incorrect Memory: Impaired Memory Impairment: Decreased recall of new information;Decreased short term memory;Retrieval deficit Decreased Short Term Memory: Verbal basic;Functional basic Immediate Memory Recall: Sock;Blue;Bed Memory Recall: Sock;Blue;Bed Memory Recall Sock: With Cue Memory Recall Blue: With Cue Memory Recall Bed: (unable to recall) Sustained Attention: Appears intact Awareness: Impaired Awareness Impairment: Intellectual impairment Problem Solving: Impaired Problem Solving Impairment: Verbal basic;Functional basic Comments: delayed processing Sensation Sensation Light Touch:  Appears Intact Stereognosis: Not tested Hot/Cold: Appears Intact Proprioception: Appears Intact Coordination Gross Motor Movements are Fluid and Coordinated: No Fine Motor Movements are Fluid and Coordinated: No Coordination and Movement Description: shuffling gait with small steps, delayed RUE coordination, cues to use R hand in dressing Finger Nose Finger Test: L - 6x in 10 sec, R - 5x in 10 sec with fair accuracy Motor  Motor Motor - Skilled Clinical Observations: UE strength WFL, LE demonstrated weakness with sit to stand, pt had difficulty fully extending legs in standing when he became fatigued Mobility    refer to functional navigator Trunk/Postural Assessment  Cervical Assessment Cervical Assessment: Within Functional Limits Thoracic Assessment Thoracic Assessment: Within Functional Limits Lumbar Assessment Lumbar Assessment: Within Functional Limits Postural Control Postural Control: Deficits on evaluation(pt holds a forward flexed (hips) posture in standing)  Balance Static Sitting Balance Static Sitting - Level of Assistance: 5: Stand by assistance Dynamic Sitting Balance Dynamic Sitting - Level of Assistance: 4: Min assist(with reaching towards his feet) Static Standing Balance Static Standing - Level of Assistance: 5: Stand by assistance Dynamic Standing Balance Dynamic Standing - Level of Assistance: 3: Mod assist Extremity/Trunk Assessment RUE Assessment RUE Assessment: Within Functional Limits(strength is WFL, PROM of shoulder 120, -30 of tricep extension PROM) LUE Assessment LUE Assessment: Within Functional Limits   See Function Navigator for Current Functional Status.   Refer to Care Plan for Long Term Goals  Recommendations for other services: None    Discharge Criteria: Patient will be discharged from OT if patient refuses treatment 3 consecutive times without medical reason, if treatment goals not met, if there is a change in medical status, if  patient makes no progress towards goals or if patient is discharged from hospital.  The above assessment, treatment plan, treatment alternatives and goals were discussed and mutually agreed upon: by patient and by family  SAGUIER,JULIA 05/12/2017, 12:28 PM

## 2017-05-12 NOTE — Patient Care Conference (Signed)
Inpatient RehabilitationTeam Conference and Plan of Care Update Date: 05/12/2017   Time: 11:20 AM    Patient Name: Edwin Jones      Medical Record Number: [id number]  Date of Birth: March 22, 1940 Sex: Male         Room/Bed: 4W10C/4W10C-01 Payor Info: Payor: MEDICARE / Plan: MEDICARE PART A AND B / Product Type: *No Product type* /    Admitting Diagnosis: STROKE  CVA  Admit Date/Time:  05/11/2017  3:27 PM Admission Comments: No comment available   Primary Diagnosis:  <principal problem not specified> Principal Problem: <principal problem not specified>  Patient Active Problem List   Diagnosis Date Noted  . CKD (chronic kidney disease) 05/11/2017  . Infarction of left basal ganglia ([place]) 05/11/2017  . Idiopathic hypotension 05/09/2017  . Stroke ([place]) 05/06/2017  . Stroke (cerebrum) ([place]) 05/06/2017  . Acute ischemic left MCA stroke ([place]) 05/06/2017  . Cancer of upper lobe of left lung ([place]) 10/30/2016  . Pure hypercholesterolemia 06/22/2014  . GERD without esophagitis 06/22/2014  . Benign essential hypertension 06/22/2014  . Diabetes mellitus without complication ([place]) 28/36/6294    Expected Discharge Date:    Team Members Present: Physician leading conference: Dr. Alysia Penna Social Worker Present: Ovidio Kin, LCSW Nurse Present: Rayetta Pigg, RN OT Present: Cherylynn Ridges, OT SLP Present: Weston Anna, SLP PPS Coordinator present : Daiva Nakayama, RN, CRRN     Current Status/Progress Goal Weekly Team Focus  Medical     adjusting meds and checking labs   medical stable     Bowel/Bladder   Incontinent of B/B  Regain continence  Timed toileting   Swallow/Nutrition/ Hydration             ADL's   min A overall with delayed processing, expressive aphasia, memory deficits, R side incoordination  supervision overall  ADL training, balance, functional mobilty, RUE Coordination, cognitive activites, pt/ family education   Mobility   eval pending  eval pending  eval  pending   Communication   eval pending  complete eval      Safety/Cognition/ Behavioral Observations  eval pending  complete eval      Pain   Denies Pain  Pain < 2  Assess Qshift and PRN   Skin   Skin intact  Prevent skin breakdown and infection  Assess qshift and PRN      *See Care Plan and progress notes for long and short-term goals.     Barriers to Discharge  Current Status/Progress Possible Resolutions Date Resolved   Physician                    Nursing                  PT                    OT                  SLP                SW                Discharge Planning/Teaching Needs:    Home with wife who can provide supervision level she has health issues of her own. She is here today and participating in his therapies, at times needs to sit down and relax.     Team Discussion:  New evaluation and setting goals in therapies. MD adjusting meds and checking labs.  Revisions  to Treatment Plan:  New eval       Elease Hashimoto 05/12/2017, 2:36 PM

## 2017-05-12 NOTE — Progress Notes (Signed)
Social Work  Social Work Assessment and Plan  Patient Details  Name: Edwin Jones MRN: [id number] Date of Birth: Mar 17, 1940  Today's Date: 05/12/2017  Problem List:  Patient Active Problem List   Diagnosis Date Noted  . CKD (chronic kidney disease) 05/11/2017  . Infarction of left basal ganglia ([place]) 05/11/2017  . Idiopathic hypotension 05/09/2017  . Stroke ([place]) 05/06/2017  . Stroke (cerebrum) ([place]) 05/06/2017  . Acute ischemic left MCA stroke ([place]) 05/06/2017  . Cancer of upper lobe of left lung ([place]) 10/30/2016  . Pure hypercholesterolemia 06/22/2014  . GERD without esophagitis 06/22/2014  . Benign essential hypertension 06/22/2014  . Diabetes mellitus without complication ([place]) 22/04/5425   Past Medical History:  Past Medical History:  Diagnosis Date  . Cancer ([place])   . COPD (chronic obstructive pulmonary disease) ([place])   . Diabetes mellitus without complication ([place])   . GERD without esophagitis   . Hepatitis C virus    history of hep C treated with Harvoni  . Hypercholesterolemia   . Hypertension   . Seasonal allergies    Past Surgical History:  Past Surgical History:  Procedure Laterality Date  . COLONOSCOPY     approximately 11 years ago  . FLEXIBLE BRONCHOSCOPY N/A 11/05/2016   Procedure: FLEXIBLE BRONCHOSCOPY;  Surgeon: Wilhelmina Mcardle, MD;  Location: [facility] ORS;  Service: Pulmonary;  Laterality: N/A;  . IR FLUORO GUIDE CV LINE RIGHT  05/06/2017  . IR INTRAVSC STENT CERV CAROTID W/O EMB-PROT MOD SED INC ANGIO  05/06/2017  . IR PERCUTANEOUS ART THROMBECTOMY/INFUSION INTRACRANIAL INC DIAG ANGIO  05/06/2017  . IR US GUIDE VASC ACCESS LEFT  05/06/2017  . IR US GUIDE VASC ACCESS RIGHT  05/06/2017  . IR US GUIDE VASC ACCESS RIGHT  05/06/2017  . PORTA CATH INSERTION N/A 11/23/2016   Procedure: Glori Luis Cath Insertion;  Surgeon: Algernon Huxley, MD;  Location: [facility];  Service: Cardiovascular;  Laterality: N/A;  . RADIOLOGY WITH ANESTHESIA N/A 05/06/2017   Procedure: IR WITH ANESTHESIA;  Surgeon: Radiologist, Medication, MD;  Location: [facility];  Service: Radiology;  Laterality: N/A;  . TEE WITHOUT CARDIOVERSION N/A 05/10/2017   Procedure: TRANSESOPHAGEAL ECHOCARDIOGRAM (TEE);  Surgeon: Sanda Klein, MD;  Location: [facility] ENDOS[facility];  Service: Cardiovascular;  Laterality: N/A;  . TONSILLECTOMY     Social History:  reports that he quit smoking about 13 years ago. His smoking use included cigarettes. He has a 50.00 pack-year smoking history. he has never used smokeless tobacco. He reports that he does not drink alcohol or use drugs.  Family / Support Systems Marital Status: Married Patient Roles: Spouse, Parent, Other (Comment)(church deacon) Spouse/Significant Other: Edwin Jones [phone] Children: Edwin Jones-son  Other Supports: grandchildren are close and local Anticipated Caregiver: Wife Ability/Limitations of Caregiver: Wife has health issues and needs to be careful-heart issues and SOB at times Caregiver Availability: 24/7 Family Dynamics: Close knit family who are there for one another, they have a good church family who visit and send their prayers. Pt is one who is very independent and wants to take care of himself, he was even with chemo and rad treatments.  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   Abuse/Neglect Abuse/Neglect Assessment Can Be Completed: Yes Physical Abuse: Denies  Verbal Abuse: Denies Sexual Abuse: Denies Exploitation of patient/patient's resources: Denies Self-Neglect: Denies  Emotional Status Pt's affect, behavior adn adjustment status: Pt is motivated to do well and  will do anything the therapy team asks of him to make improvements. His wife is here and trying to help but needs to let the therapists do their evaluations. She is willing to help while here just like being at home Recent Psychosocial Issues: other health issues had just finished his rad and chemo treatments for his lung cancer prior to coming into the hospital Pyschiatric History: No history deferred depression screen due to doing well and still adjusting to the new unit and program. He is coping appropriately and is talking about his feelings. Will monitor and have neruo-psych see while here if would be beneficial Substance Abuse History: No issues-quit tobacco years ago  Patient / Family Perceptions, Expectations & Goals Pt/Family understanding of illness & functional limitations: Pt and wife can explain his stroke and deficits. They both have spoken with the MD and feel thier questions and concerns have been addressed. Wife plans to be here daily and ask the MD her questions that come up. She is not one to shy away from finding out the answers she seeks. 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, father, grandfather, retiree, church deacon, friend, etc Anticipated changes in roles/activities/participation: resume Pt/family expectations/goals: Pt states: " I want to be able to take care of myself before I leave here."  Wife states: " I hope he can get back to what he was doing before this, I need him too."  [employer]: Other (Comment)(Cancer [facility]) Premorbid Home Care/DME Agencies: None Transportation available at discharge: Wife Resource referrals recommended: Support group (specify)  Discharge Planning Living Arrangements: Spouse/significant other Support Systems: Spouse/significant other, Children, Other relatives, Friends/neighbors, Social worker community Type of Residence: Private residence Insurance Resources: [employer] Financial Resources: Charity fundraiser Screen Referred: No Living Expenses: Education officer, community Management: Patient, Spouse Does the patient have any problems obtaining your medications?: No Home Management: Wife Patient/Family Preliminary Plans: Return home with wife who is willing to assist and maybe do too much for him at the expense of her own health issues. Glad she will be here daily and see him in therpaies so she will see what he can do for himself and what he will need assist with. Will await therapy team evaluations and work on discharge needs. Social Work Anticipated Follow Up Needs: Support Group, HH/OP  Clinical Impression Pleasant gentleman who is willing to push himself in therapies to achieve his goals of mod/i-supervision level. His wife is a strong support and tries to do too much for him, but feels she needs to do something while here. Will await therapy evaluations and work on a safe plan for him.WIll monitor to see if neuro-psych needed.   Elease Hashimoto 05/12/2017, 2:55 PM

## 2017-05-12 NOTE — Plan of Care (Signed)
  Progressing RH BOWEL ELIMINATION RH STG MANAGE BOWEL WITH ASSISTANCE Description STG Manage Bowel with min Assistance.  05/12/2017 6553 - Progressing by Evelena Asa, RN RH SKIN INTEGRITY RH STG SKIN FREE OF INFECTION/BREAKDOWN Description Patients skin will remain free from further infection or breakdown with min assist.  05/12/2017 7482 - Progressing by Evelena Asa, RN

## 2017-05-12 NOTE — Discharge Instructions (Signed)
Inpatient Rehab Discharge Instructions  Edwin Jones Discharge date and time:    Activities/Precautions/ Functional Status: Activity: no lifting, driving, or strenuous exercise till cleared by MD Diet: cardiac diet Wound Care:   Functional status:  ___ No restrictions     ___ Walk up steps independently ___ 24/7 supervision/assistance   ___ Walk up steps with assistance ___ Intermittent supervision/assistance  ___ Bathe/dress independently ___ Walk with walker     ___ Bathe/dress with assistance ___ Walk Independently    ___ Shower independently ___ Walk with assistance    ___ Shower with assistance _X__ No alcohol     ___ Return to work/school ________   Special Instructions:   STROKE/TIA DISCHARGE INSTRUCTIONS SMOKING Cigarette smoking nearly doubles your risk of having a stroke & is the single most alterable risk factor  If you smoke or have smoked in the last 12 months, you are advised to quit smoking for your health.  Most of the excess cardiovascular risk related to smoking disappears within a year of stopping.  Ask you doctor about anti-smoking medications  [place] Quit Line: 1-800-QUIT NOW  Free Smoking Cessation Classes [phone]  CHOLESTEROL Know your levels; limit fat & cholesterol in your diet  Lipid Panel     Component Value Date/Time   CHOL 173 05/07/2017 0500   TRIG 215 (H) 05/07/2017 0500   HDL 61 05/07/2017 0500   CHOLHDL 2.8 05/07/2017 0500   VLDL 43 (H) 05/07/2017 0500   LDLCALC 69 05/07/2017 0500      Many patients benefit from treatment even if their cholesterol is at goal.  Goal: Total Cholesterol (CHOL) less than 160  Goal:  Triglycerides (TRIG) less than 150  Goal:  HDL greater than 40  Goal:  LDL (LDLCALC) less than 100   BLOOD PRESSURE American Stroke Association blood pressure target is less that 120/80 mm/Hg  Your discharge blood pressure is:  BP: (!) 141/71  Monitor your blood pressure  Limit your salt and alcohol intake  Many  individuals will require more than one medication for high blood pressure  DIABETES (A1c is a blood sugar average for last 3 months) Goal HGBA1c is under 7% (HBGA1c is blood sugar average for last 3 months)  Diabetes:     Lab Results  Component Value Date   HGBA1C 9.1 (H) 05/07/2017     Your HGBA1c can be lowered with medications, healthy diet, and exercise.  Check your blood sugar as directed by your physician  Call your physician if you experience unexplained or low blood sugars.  PHYSICAL ACTIVITY/REHABILITATION Goal is 30 minutes at least 4 days per week  Activity: No driving, Therapies: see above Return to work: to be decided on follow up.   Activity decreases your risk of heart attack and stroke and makes your heart stronger.  It helps control your weight and blood pressure; helps you relax and can improve your mood.  Participate in a regular exercise program.  Talk with your doctor about the best form of exercise for you (dancing, walking, swimming, cycling).  DIET/WEIGHT Goal is to maintain a healthy weight  Your discharge diet is: Diet heart healthy/carb modified Room service appropriate? Yes; Fluid consistency: Thin liquids Your height is:    Your current weight is:   Your Body Mass Index (BMI) is:     Following the type of diet specifically designed for you will help prevent another stroke.  Your goal weight  is:   Your goal Body Mass Index (BMI) is  19-24.  Healthy food habits can help reduce 3 risk factors for stroke:  High cholesterol, hypertension, and excess weight.  RESOURCES Stroke/Support Group:  Call [phone]   STROKE EDUCATION PROVIDED/REVIEWED AND GIVEN TO PATIENT Stroke warning signs and symptoms How to activate emergency medical system (call 911). Medications prescribed at discharge. Need for follow-up after discharge. Personal risk factors for stroke. Pneumonia vaccine given:  Flu vaccine given:  My questions have been answered, the writing is  legible, and I understand these instructions.  I will adhere to these goals & educational materials that have been provided to me after my discharge from the hospital.     My questions have been answered and I understand these instructions. I will adhere to these goals and the provided educational materials after my discharge from the hospital.  Patient/Caregiver Signature _______________________________ Date __________  Clinician Signature _______________________________________ Date __________  Please bring this form and your medication list with you to all your follow-up doctor's appointments.

## 2017-05-12 NOTE — Care Management Note (Signed)
Inpatient [facility] Individual Statement of Services  Patient Name:  Edwin Jones  Date:  05/12/2017  Welcome to the [facility].  Our goal is to provide you with an individualized program based on your diagnosis and situation, designed to meet your specific needs.  With this comprehensive rehabilitation program, you will be expected to participate in at least 3 hours of rehabilitation therapies Monday-Friday, with modified therapy programming on the weekends.  Your rehabilitation program will include the following services:  Physical Therapy (PT), Occupational Therapy (OT), Speech Therapy (ST), 24 hour per day rehabilitation nursing, Neuropsychology, Case Management (Social Worker), Rehabilitation Medicine, Nutrition Services and Pharmacy Services  Weekly team conferences will be held on Wednesday to discuss your progress.  Your Social Worker will talk with you frequently to get your input and to update you on team discussions.  Team conferences with you and your family in attendance may also be held.  Expected length of stay: 12-16 Days  Overall anticipated outcome: supervision with cueing  Depending on your progress and recovery, your program may change. Your Social Worker will coordinate services and will keep you informed of any changes. Your Social Worker's name and contact numbers are listed  below.  The following services may also be recommended but are not provided by the [facility] will be made to provide these services after discharge if needed.  Arrangements include referral to agencies that provide these services.  Your insurance has been verified to be:  medicare Your primary doctor is:  Delight Stare  Pertinent information will be shared with your doctor and your insurance company.  Social Worker:  Ovidio Kin, [place] or ([phone]  Information discussed with and copy given to patient by: Elease Hashimoto, 05/12/2017, 9:52 AM

## 2017-05-13 ENCOUNTER — Inpatient Hospital Stay (HOSPITAL_COMMUNITY): Payer: Medicare Other | Admitting: Physical Therapy

## 2017-05-13 ENCOUNTER — Inpatient Hospital Stay (HOSPITAL_COMMUNITY): Payer: Medicare Other | Admitting: Speech Pathology

## 2017-05-13 ENCOUNTER — Inpatient Hospital Stay (HOSPITAL_COMMUNITY): Payer: Medicare Other | Admitting: Occupational Therapy

## 2017-05-13 ENCOUNTER — Inpatient Hospital Stay (HOSPITAL_COMMUNITY): Payer: Medicare Other

## 2017-05-13 DIAGNOSIS — R7309 Other abnormal glucose: Secondary | ICD-10-CM

## 2017-05-13 DIAGNOSIS — N183 Chronic kidney disease, stage 3 (moderate): Secondary | ICD-10-CM

## 2017-05-13 DIAGNOSIS — R0989 Other specified symptoms and signs involving the circulatory and respiratory systems: Secondary | ICD-10-CM | POA: Insufficient documentation

## 2017-05-13 DIAGNOSIS — D62 Acute posthemorrhagic anemia: Secondary | ICD-10-CM

## 2017-05-13 DIAGNOSIS — E119 Type 2 diabetes mellitus without complications: Secondary | ICD-10-CM

## 2017-05-13 LAB — BASIC METABOLIC PANEL
ANION GAP: 10 (ref 5–15)
BUN: 20 mg/dL (ref 6–20)
CO2: 21 mmol/L — AB (ref 22–32)
Calcium: 8.7 mg/dL — ABNORMAL LOW (ref 8.9–10.3)
Chloride: 108 mmol/L (ref 101–111)
Creatinine, Ser: 1.81 mg/dL — ABNORMAL HIGH (ref 0.61–1.24)
GFR calc Af Amer: 40 mL/min — ABNORMAL LOW (ref >60)
GFR calc non Af Amer: 34 mL/min — ABNORMAL LOW (ref >60)
GLUCOSE: 145 mg/dL — AB (ref 65–99)
Potassium: 4.4 mmol/L (ref 3.5–5.1)
Sodium: 139 mmol/L (ref 135–145)

## 2017-05-13 LAB — GLUCOSE, CAPILLARY
GLUCOSE-CAPILLARY: 96 mg/dL (ref 65–99)
Glucose-Capillary: 133 mg/dL — ABNORMAL HIGH (ref 65–99)
Glucose-Capillary: 150 mg/dL — ABNORMAL HIGH (ref 65–99)
Glucose-Capillary: 106 mg/dL — ABNORMAL HIGH (ref 65–99)

## 2017-05-13 LAB — URINE CULTURE

## 2017-05-13 NOTE — IPOC Note (Addendum)
Overall Plan of Care [facility]) Patient Details Name: Edwin Jones MRN: [id number] DOB: August 28, 1940  Admitting Diagnosis: Left basal ganglia infarct  Hospital Problems: Active Problems:   Idiopathic hypotension   Infarction of left basal ganglia (HCC)   Labile blood pressure   Acute blood loss anemia   Diabetes mellitus type 2 in nonobese (HCC)   Labile blood glucose     Functional Problem List: Nursing Bowel, Bladder, Endurance, Medication Management, Safety, Motor  PT Balance, Behavior, Edema, Endurance, Nutrition, Motor, Pain, Perception, Safety, Sensory, Skin Integrity  OT Balance, Cognition, Endurance, Motor, Perception, Safety  SLP    TR         Basic ADL's: OT Eating, Grooming, Bathing, Dressing, Toileting     Advanced  ADL's: OT       Transfers: PT Bed Mobility, Bed to Chair, Car, [employer], Floor  OT Toilet, Metallurgist: PT Ambulation, Emergency planning/management officer, Stairs     Additional Impairments: OT Fuctional Use of Upper Extremity  SLP Communication, Social Cognition comprehension, expression Problem Solving, Attention, Awareness  TR      Anticipated Outcomes Item Anticipated Outcome  Self Feeding mod I  Swallowing      Basic self-care  supervision  Toileting  supervision   Bathroom Transfers supervision  Bowel/Bladder  MIn assist  Transfers  Mod I with LRAD   Locomotion  ambulatory at supervision level   Communication  Supervision  Cognition  Supervision  Pain  < 3   Safety/Judgment  Min assist   Therapy Plan: PT Intensity: Minimum of 1-2 x/day ,45 to 90 minutes PT Frequency: 5 out of 7 days PT Duration Estimated Length of Stay: 12-16days  OT Intensity: Minimum of 1-2 x/day, 45 to 90 minutes OT Frequency: 5 out of 7 days OT Duration/Estimated Length of Stay: 18-19 days SLP Intensity: Minumum of 1-2 x/day, 30 to 90 minutes SLP Frequency: 3 to 5 out of 7 days SLP Duration/Estimated Length of Stay: 2.5 weeks    Team  Interventions: Nursing Interventions Patient/Family Education, Bladder Management, Disease Management/Prevention, Medication Management, Bowel Management, Cognitive Remediation/Compensation  PT interventions Ambulation/gait training, Balance/vestibular training, Cognitive remediation/compensation, Community reintegration, Discharge planning, Functional electrical stimulation, Neuromuscular re-education, DME/adaptive equipment instruction, Disease management/prevention, Functional mobility training, Pain management, Patient/family education, Psychosocial support, Skin care/wound management, Stair training, Splinting/orthotics, Therapeutic Activities, Therapeutic Exercise, UE/LE Strength taining/ROM, UE/LE Coordination activities, Visual/perceptual remediation/compensation, Wheelchair propulsion/positioning  OT Interventions Training and development officer, Cognitive remediation/compensation, Discharge planning, DME/adaptive equipment instruction, Functional mobility training, Neuromuscular re-education, Patient/family education, Psychosocial support, Self Care/advanced ADL retraining, Therapeutic Activities, Therapeutic Exercise, UE/LE Strength taining/ROM, UE/LE Coordination activities, Visual/perceptual remediation/compensation  SLP Interventions Cueing hierarchy, Functional tasks, Patient/family education, Therapeutic Activities, Speech/Language facilitation  TR Interventions    SW/CM Interventions Discharge Planning, Psychosocial Support, Patient/Family Education   Barriers to Discharge MD  Medical stability  Nursing      PT Inaccessible home environment, Decreased caregiver support    OT      SLP      SW       Team Discharge Planning: Destination: PT-Home ,OT- Home , SLP-Home Projected Follow-up: PT-Home health PT, OT-  Home health OT, SLP-Outpatient SLP, 24 hour supervision/assistance Projected Equipment Needs: PT-To be determined, Kasandra Knudsen, OT- To be determined, SLP-None recommended by  SLP Equipment Details: PT- , OT-  Patient/family involved in discharge planning: PT- Patient,  OT-Patient, Family member/caregiver, SLP-Patient, Family member/caregiver  MD ELOS: 15-18 days. Medical Rehab Prognosis:  Good Assessment: 77 y.o. 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 He did not receive TPA secondary to concerns of potential brain metastasis. CTA brain done revealing demonstrating left M1 occlusion and patient underwent mechanical thrombectomy and ICA stenting. Follow up MRI/MRA brain showed restored patency of L-ICA and L-MCA, mild cytotoxic edema and left basal ganglia infarct, small infarct in left amygdala and small number of scatterd tiny cortical infarcts in L-MCA territory. He had problems with hypotension after ICA stenting was placed on Neo-Synephrine drip as well as albumin and stress dose steroids. He was oral midodrine and florinef for BP support. Dr. Erlinda Hong and Dr. Rogue Bussing felt that stroke was not related to hypercoagulable state but likely cardioembolic source therefore TEE done 3/4 and showed dilated left atrium negative for thrombus, NO PFO/ASD with very late bubbles in left atrium consistent with pulmonic re-circulation and no source of cardiac or aortic emboli. He was started on ASA/Plavix for embolic stroke and loop recorder recommended to rule out A fib. Blood sugars have been poorly controlled and low dose Lantus recommended for tighter control. Patient with resultant left sided weakness with shuffling gait with need for rest breaks, cognitive deficits and difficulty with ADL tasks. Will set goals for supervision with most tasks with PT/OT/SLP.     See Team Conference Notes for weekly updates to the plan of care

## 2017-05-13 NOTE — Progress Notes (Signed)
Physical Therapy Session Note  Patient Details  Name: Edwin Jones MRN: [id number] Date of Birth: [birth year]  Today's Date: 05/13/2017 PT Individual Time: 1000-1030 PT Individual Time Calculation (min): 30 min   Short Term Goals: Week 1:  PT Short Term Goal 1 (Week 1): Pt will perform bed mobility without assist, but increased time.  PT Short Term Goal 2 (Week 1): Pt will ambulate 175ft with contact guard assist  PT Short Term Goal 3 (Week 1): Pt will propell WC 155ft with min assist  PT Short Term Goal 4 (Week 1): Pt will perform stand pivot transfers with supervision assist   Skilled Therapeutic Interventions/Progress Updates:    Patient in w/c in room, sit to stand min A increased time, heavy UE support.  Patient ambulated with min A x 120' to dayroom min cues/facilitation for L lateral weight shift and increased R foot clearance.  Standing balance no UE support min A tossing ball to self then moving it around R vs L hand and overhead, then alternating kicking with R vs L foot.  Patient performed Nu Step x 5 min UE/LE at level 4 for endurance and flexibility.  Performed cervical rotation/flex/ext stretches then reaching to floor x 3 from sitting for lower spinal stretch.  Gait to room x 120' min A increased stride length and arm swing noted.  Stand pivot to bed from w/c min A and cues for safety.  Sit to supine S cues for trunk positioning.  Left in bed with alarm on and all needs in reach.  Therapy Documentation Precautions:  Precautions Precautions: Fall Precaution Comments: low blood pressure Restrictions Weight Bearing Restrictions: No General:   Vital Signs:  Pain: Pain Assessment Pain Assessment: No/denies pain Pain Score: 0-No pain   See Function Navigator for Current Functional Status.   Therapy/Group: Individual Therapy  Reginia Naas 05/13/2017, 10:39 AM

## 2017-05-13 NOTE — Progress Notes (Signed)
Physical Therapy Session Note  Patient Details  Name: Edwin Jones MRN: [id number] Date of Birth: 1940-05-30  Today's Date: 05/13/2017 PT Individual Time: 8387-0658 PT Individual Time Calculation (min): 60 min   Short Term Goals: Week 1:  PT Short Term Goal 1 (Week 1): Pt will perform bed mobility without assist, but increased time.  PT Short Term Goal 2 (Week 1): Pt will ambulate 177f with contact guard assist  PT Short Term Goal 3 (Week 1): Pt will propell WC 1072fwith min assist  PT Short Term Goal 4 (Week 1): Pt will perform stand pivot transfers with supervision assist   Skilled Therapeutic Interventions/Progress Updates:    no c/o pain, but reporting fatigue.  Session focus on L weight shift for improved RLE swing through during gait, and RUE fine motor coordination/activation.    Pt ambulates to and from therapy gym with min assist to facilitate L weight shift.  Standing with R foot wedged for forced L weight shift while engaged in L reaching activity to promote further unloading of RLE.  Pt requires 2 seated rest breaks during activity and min assist for balance during task.  RUE coordination/activity tolerance task retrieving beads out of putty with RUE only.  Requires increased time and verbal cues for manipulating pieces.  Pt returned to room at end of session and returned to bed.  Positioned in supine with supervision, call bell in reach and needs met.   Therapy Documentation Precautions:  Precautions Precautions: Fall Precaution Comments: low blood pressure Restrictions Weight Bearing Restrictions: No General: PT Amount of Missed Time (min): 15 Minutes PT Missed Treatment Reason: Patient fatigue   See Function Navigator for Current Functional Status.   Therapy/Group: Individual Therapy  CaMichel Santee/09/2017, 4:31 PM

## 2017-05-13 NOTE — Progress Notes (Signed)
Speech Language Pathology Daily Session Note  Patient Details  Name: Edwin Jones MRN: [id number] Date of Birth: 05-Dec-1940  Today's Date: 05/13/2017 SLP Individual Time: 1330-1415 SLP Individual Time Calculation (min): 45 min  Short Term Goals: Week 1: SLP Short Term Goal 1 (Week 1): Pt will utilize word finding strategies toname common objects with 95% accuracy and Min A cues.  SLP Short Term Goal 2 (Week 1): Pt will self-monitor and self-correct verbal responses with Mod A cues.  SLP Short Term Goal 3 (Week 1): Pt will demonstrate selective attention in moderately distracting environment for ~30 minutes with supervision cues.  SLP Short Term Goal 4 (Week 1): Pt will follow 2 step directions iwth 75% accuracy and Mod A cues.  SLP Short Term Goal 5 (Week 1): Pt will utilize speech intelligibility strategies (increased vocal intensity) to achieve ~ 50% intelligibility at the phrase level with Mod A cues.   Skilled Therapeutic Interventions: Skilled treatment session focused on cognitive-linguistic goals. Upon arrival, patient was asleep in bed and required Moderate verbal and tactile cues for arousal. Patient agreeable to participate in treatment session but was lethargic throughout.  Patient named functional items with 90% accuracy and Min A semantic cues but required Max A multimodal cues to compare/contrast items at the phrase level. Patient also required Max A verbal cues for use of an increased vocal intensity and over-articulation to achieve ~75% intelligible at the phrase level. Patient left upright in bed with alarm on and all needs within reach. Continue with current plan of care.      Function:   Cognition Comprehension Comprehension assist level: Understands basic 75 - 89% of the time/ requires cueing 10 - 24% of the time  Expression   Expression assist level: Expresses basic 50 - 74% of the time/requires cueing 25 - 49% of the time. Needs to repeat parts of sentences.  Social  Interaction Social Interaction assist level: Interacts appropriately 75 - 89% of the time - Needs redirection for appropriate language or to initiate interaction.  Problem Solving Problem solving assist level: Solves basic 75 - 89% of the time/requires cueing 10 - 24% of the time  Memory Memory assist level: Recognizes or recalls 50 - 74% of the time/requires cueing 25 - 49% of the time    Pain No/Denies Pain   Therapy/Group: Individual Therapy  Karlon Schlafer 05/13/2017, 3:17 PM

## 2017-05-13 NOTE — Progress Notes (Signed)
Occupational Therapy Session Note  Patient Details  Name: Edwin Jones MRN: [id number] Date of Birth: February 19, 1941  Today's Date: 05/13/2017 OT Individual Time: 0805-0900 OT Individual Time Calculation (min): 55 min   Short Term Goals: Week 1:  OT Short Term Goal 1 (Week 1): Pt will be able to use R hand to bring a cup of water toward his mouth and drink without spilling the water. OT Short Term Goal 2 (Week 1): Pt will demonstrate improved processing by donning a shirt in 1-2 minutes (vs 5 minutes). OT Short Term Goal 3 (Week 1): Pt will demonstrate improved dynamic sitting balance by donning pants over R leg from a seated position with S.  OT Short Term Goal 4 (Week 1): Pt will demonstrate improved activity tolerance to stand at the sink with steadying A to complete oral care.  Skilled Therapeutic Interventions/Progress Updates:    Pt greeted semi-reclined in bed finishing taking meds from nursing staff. Pt spilled coffee all over bed and floor. Pt came to sitting EOB with min A, then completed stand-pivot to wc with min/mod A . Pt completed bathing/dressing at the sink with primary use of R UE for neuro re-ed. Incorporated forced use of R UE within dressing tasks when reaching to pull up pants and clothing materials. Sit><stand with overall min A and facilittaion to maintain anterior weight shift. Worked on standing balance and endurance within BADL tasks with pt tolerating 3 mins standing at longest bout. Pt left seated in wc at end of session with safety belt on and call bell in reach.   Therapy Documentation Precautions:  Precautions Precautions: Fall Precaution Comments: low blood pressure Restrictions Weight Bearing Restrictions: No Pain:  none/denies painOP ADL: ADL ADL Comments: refer to functional navigator  See Function Navigator for Current Functional Status.   Therapy/Group: Individual Therapy  Valma Cava 05/13/2017, 9:01 AM

## 2017-05-13 NOTE — Progress Notes (Addendum)
Subjective/Complaints: Patient seen lying in bed this morning. He states he slept well overnight. He states a good first in therapies yesterday. He asks me to bring in his breakfast.  Review of systems: Denies CP, SOB, nausea, vomiting, diarrhea.  Objective: Vital Signs: Blood pressure (!) 164/67, pulse 65, temperature 98.1 F (36.7 C), temperature source Oral, resp. rate 18, weight 96.2 kg (212 lb 1.8 oz), SpO2 98 %. Dg Chest 2 View  Result Date: 05/11/2017 CLINICAL DATA:  Productive cough, chest pain and shortness of breath. EXAM: CHEST - 2 VIEW COMPARISON:  Chest radiograph May 08, 2016 FINDINGS: Cardiomediastinal silhouette is normal. LEFT perihilar consolidation and diffuse interstitial prominence. Improved aeration LEFT lung base with cyst small pleural effusions. No pneumothorax. Single lumen RIGHT chest Port-A-Cath distal tip projects in proximal superior vena cava. No pneumothorax. Osteopenia. IMPRESSION: Persistent LEFT perihilar consolidation, possible pneumonia. Improved aeration LEFT lung base. Small pleural effusions. Diffuse interstitial prominence seen with atypical infection, potentially chronic. Electronically Signed   By: Elon Alas M.D.   On: 05/11/2017 19:54   Results for orders placed or performed during the hospital encounter of 05/11/17 (from the past 72 hour(s))  Glucose, capillary     Status: Abnormal   Collection Time: 05/11/17  4:38 PM  Result Value Ref Range   Glucose-Capillary 116 (H) 65 - 99 mg/dL  Glucose, capillary     Status: Abnormal   Collection Time: 05/11/17  9:12 PM  Result Value Ref Range   Glucose-Capillary 222 (H) 65 - 99 mg/dL  Glucose, capillary     Status: Abnormal   Collection Time: 05/12/17  6:25 AM  Result Value Ref Range   Glucose-Capillary 188 (H) 65 - 99 mg/dL  CBC WITH DIFFERENTIAL     Status: Abnormal   Collection Time: 05/12/17  7:53 AM  Result Value Ref Range   WBC 5.9 4.0 - 10.5 K/uL   RBC 3.45 (L) 4.22 - 5.81 MIL/uL   Hemoglobin 10.6 (L) 13.0 - 17.0 g/dL   HCT 33.1 (L) 39.0 - 52.0 %   MCV 95.9 78.0 - 100.0 fL   MCH 30.7 26.0 - 34.0 pg   MCHC 32.0 30.0 - 36.0 g/dL   RDW 13.3 11.5 - 15.5 %   Platelets 201 150 - 400 K/uL   Neutrophils Relative % 73 %   Neutro Abs 4.3 1.7 - 7.7 K/uL   Lymphocytes Relative 17 %   Lymphs Abs 1.0 0.7 - 4.0 K/uL   Monocytes Relative 7 %   Monocytes Absolute 0.4 0.1 - 1.0 K/uL   Eosinophils Relative 2 %   Eosinophils Absolute 0.1 0.0 - 0.7 K/uL   Basophils Relative 1 %   Basophils Absolute 0.0 0.0 - 0.1 K/uL    Comment: Performed at [facility], [address] [place], [place] [address]  Comprehensive metabolic panel     Status: Abnormal   Collection Time: 05/12/17  7:53 AM  Result Value Ref Range   Sodium 139 135 - 145 mmol/L   Potassium 4.1 3.5 - 5.1 mmol/L   Chloride 107 101 - 111 mmol/L   CO2 21 (L) 22 - 32 mmol/L   Glucose, Bld 219 (H) 65 - 99 mg/dL   BUN 20 6 - 20 mg/dL   Creatinine, Ser 1.80 (H) 0.61 - 1.24 mg/dL   Calcium 8.5 (L) 8.9 - 10.3 mg/dL   Total Protein 5.9 (L) 6.5 - 8.1 g/dL   Albumin 2.4 (L) 3.5 - 5.0 g/dL   AST 16 15 -  41 U/L   ALT 11 (L) 17 - 63 U/L   Alkaline Phosphatase 68 38 - 126 U/L   Total Bilirubin 0.5 0.3 - 1.2 mg/dL   GFR calc non Af Amer 35 (L) >60 mL/min   GFR calc Af Amer 40 (L) >60 mL/min    Comment: (NOTE) The eGFR has been calculated using the CKD EPI equation. This calculation has not been validated in all clinical situations. eGFR's persistently <60 mL/min signify possible Chronic Kidney Disease.    Anion gap 11 5 - 15    Comment: Performed at [facility] [address] [place], [place] [address]  Glucose, capillary     Status: Abnormal   Collection Time: 05/12/17 11:32 AM  Result Value Ref Range   Glucose-Capillary 269 (H) 65 - 99 mg/dL  Urinalysis, Routine w reflex microscopic     Status: Abnormal   Collection Time: 05/12/17  1:05 PM  Result Value Ref Range   Color, Urine YELLOW YELLOW    APPearance CLEAR CLEAR   Specific Gravity, Urine 1.015 1.005 - 1.030   pH 5.0 5.0 - 8.0   Glucose, UA 50 (A) NEGATIVE mg/dL   Hgb urine dipstick NEGATIVE NEGATIVE   Bilirubin Urine NEGATIVE NEGATIVE   Ketones, ur NEGATIVE NEGATIVE mg/dL   Protein, ur NEGATIVE NEGATIVE mg/dL   Nitrite NEGATIVE NEGATIVE   Leukocytes, UA NEGATIVE NEGATIVE    Comment: Performed at [facility] [address], [place], [place] [address]  Glucose, capillary     Status: None   Collection Time: 05/12/17  4:55 PM  Result Value Ref Range   Glucose-Capillary 80 65 - 99 mg/dL  Glucose, capillary     Status: Abnormal   Collection Time: 05/12/17  9:06 PM  Result Value Ref Range   Glucose-Capillary 111 (H) 65 - 99 mg/dL  Glucose, capillary     Status: Abnormal   Collection Time: 05/13/17  6:19 AM  Result Value Ref Range   Glucose-Capillary 133 (H) 65 - 99 mg/dL  Basic metabolic panel     Status: Abnormal   Collection Time: 05/13/17  6:53 AM  Result Value Ref Range   Sodium 139 135 - 145 mmol/L   Potassium 4.4 3.5 - 5.1 mmol/L   Chloride 108 101 - 111 mmol/L   CO2 21 (L) 22 - 32 mmol/L   Glucose, Bld 145 (H) 65 - 99 mg/dL   BUN 20 6 - 20 mg/dL   Creatinine, Ser 1.81 (H) 0.61 - 1.24 mg/dL   Calcium 8.7 (L) 8.9 - 10.3 mg/dL   GFR calc non Af Amer 34 (L) >60 mL/min   GFR calc Af Amer 40 (L) >60 mL/min    Comment: (NOTE) The eGFR has been calculated using the CKD EPI equation. This calculation has not been validated in all clinical situations. eGFR's persistently <60 mL/min signify possible Chronic Kidney Disease.    Anion gap 10 5 - 15    Comment: Performed at [facility] [address] [place], [place] [address]     General: NAD. Vital signs reviewed. HEENT: Normocephalic, atraumatic. Cardio: RRR and No JVD. Resp: CTA B/L and Unlabored GI: BS positive and nondistended Musc/Skel:  No tenderness. No edema. Neuro: Alert/Oriented Right facial weakness Motor 4+/5 RUE/RLE 5/5 in the  left deltoid bicep tricep grip hip flexion knee extension ankle dorsiflexion Dysarthria Skin:   Intact. Warm and dry. Psych: Flat affect.   Assessment/Plan: 1. Functional deficits secondary to left MCA infarct  with right hemiparesis which require 3+ hours per day of interdisciplinary therapy in a comprehensive inpatient rehab setting. Physiatrist is providing close team supervision and 24 hour management of active medical problems listed below. Physiatrist and rehab team continue to assess barriers to discharge/monitor patient progress toward functional and medical goals. FIM: Function - Bathing Position: Wheelchair/chair at sink Body parts bathed by patient: Right arm, Left arm, Chest, Abdomen, Front perineal area, Right upper leg, Left upper leg Body parts bathed by helper: Buttocks, Right lower leg, Left lower leg Assist Level: Touching or steadying assistance(Pt > 75%)  Function- Upper Body Dressing/Undressing What is the patient wearing?: Pull over shirt/dress Pull over shirt/dress - Perfomed by patient: Thread/unthread right sleeve, Thread/unthread left sleeve, Put head through opening Pull over shirt/dress - Perfomed by helper: Pull shirt over trunk Assist Level: Touching or steadying assistance(Pt > 75%) Function - Lower Body Dressing/Undressing What is the patient wearing?: Pants, Non-skid slipper socks Position: Wheelchair/chair at sink Pants- Performed by patient: Thread/unthread left pants leg, Pull pants up/down Pants- Performed by helper: Thread/unthread right pants leg Non-skid slipper socks- Performed by helper: Don/doff left sock, Don/doff right sock Socks - Performed by helper: Don/doff right sock, Don/doff left sock Shoes - Performed by helper: Don/doff right shoe, Don/doff left shoe, Fasten right, Fasten left Assist for footwear: Dependant Assist for lower body dressing: Touching or steadying assistance (Pt > 75%)  Function - Toileting Toileting steps completed  by helper: Adjust clothing prior to toileting, Performs perineal hygiene, Adjust clothing after toileting Assist level: Two helpers  Function - Air cabin crew transfer assistive device: Grab bar Assist level to toilet: Moderate assist (Pt 50 - 74%/lift or lower) Assist level from toilet: Moderate assist (Pt 50 - 74%/lift or lower)  Function - Chair/bed transfer Chair/bed transfer method: Stand pivot Chair/bed transfer assist level: Touching or steadying assistance (Pt > 75%)  Function - Locomotion: Wheelchair Type: Manual Max wheelchair distance: 134f  Assist Level: Moderate assistance (Pt 50 - 74%) Assist Level: Moderate assistance (Pt 50 - 74%) Wheel 150 feet activity did not occur: Safety/medical concerns Function - Locomotion: Ambulation Assistive device: No device Max distance: 1539f Assist level: Touching or steadying assistance (Pt > 75%) Assist level: Touching or steadying assistance (Pt > 75%) Assist level: Touching or steadying assistance (Pt > 75%) Assist level: Touching or steadying assistance (Pt > 75%)  Function - Comprehension Comprehension: Auditory Comprehension assist level: Understands basic 75 - 89% of the time/ requires cueing 10 - 24% of the time  Function - Expression Expression: Verbal Expression assistive device: Communication board Expression assist level: Expresses basic 50 - 74% of the time/requires cueing 25 - 49% of the time. Needs to repeat parts of sentences.  Function - Social Interaction Social Interaction assist level: Interacts appropriately 75 - 89% of the time - Needs redirection for appropriate language or to initiate interaction.  Function - Problem Solving Problem solving assist level: Solves basic 75 - 89% of the time/requires cueing 10 - 24% of the time  Function - Memory Memory assist level: Recognizes or recalls 50 - 74% of the time/requires cueing 25 - 49% of the time Patient normally able to recall (first 3 days  only): Staff names and faces, That he or she is in a hospital, Current season  Medical Problem List and Plan:  1. Functional and cognitive deficits secondary to left MCA infarct    Continue CIR   Notes reviewed, images reviewed, labs reviewed 2. DVT Prophylaxis/Anticoagulation: Pharmaceutical: Heparin.  Dopplers to rule out DVT pending  3. Pain Management: tylenol prn  4. Mood: Appears depressed. Team to provide ego support. LCSW to follow for evaluation and support.  5. Neuropsych: This patient is not capable of making decisions on his own behalf.  6. Skin/Wound Care: routine pressure relief measures.  7. Fluids/Electrolytes/Nutrition: Monitor I/O.   8. Hypotension: Will continue to monitor BP. Continue midodrine and florinef for BP support.    Blood pressure labile on 3/17, orthostatics negative 9. COPD: Continue Breo. Albuterol nebs prn.  Left perihilar density is likely from his lung CA.  There are small pleural effusions may discontinue IV fluids and monitor intake  10. T2DM: Will monitor BS ac/hs. Continue glipizide and trajenta with SSI for elevated BS.  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  12. CKD   Creatinine 1.81 on 3/7   Cont to monitor 13. Acute blood loss anemia   Hemoglobin 10.6 on 3/6   Continue to monitor   LOS (Days) 2 A FACE TO FACE EVALUATION WAS PERFORMED  Ankit Lorie Phenix 05/13/2017, 9:54 AM

## 2017-05-14 ENCOUNTER — Inpatient Hospital Stay (HOSPITAL_COMMUNITY): Payer: Medicare Other | Admitting: Speech Pathology

## 2017-05-14 ENCOUNTER — Inpatient Hospital Stay (HOSPITAL_COMMUNITY): Payer: Medicare Other | Admitting: Physical Therapy

## 2017-05-14 ENCOUNTER — Inpatient Hospital Stay (HOSPITAL_COMMUNITY): Payer: Medicare Other | Admitting: Occupational Therapy

## 2017-05-14 DIAGNOSIS — I951 Orthostatic hypotension: Secondary | ICD-10-CM

## 2017-05-14 LAB — GLUCOSE, CAPILLARY
GLUCOSE-CAPILLARY: 145 mg/dL — AB (ref 65–99)
GLUCOSE-CAPILLARY: 73 mg/dL (ref 65–99)
GLUCOSE-CAPILLARY: 168 mg/dL — AB (ref 65–99)
Glucose-Capillary: 126 mg/dL — ABNORMAL HIGH (ref 65–99)

## 2017-05-14 NOTE — Progress Notes (Signed)
Subjective/Complaints: Patient seen lying in bed this morning. He indicates he slept well overnight.  Review of systems: Denies CP, SOB, nausea, vomiting, diarrhea.  Objective: Vital Signs: Blood pressure 139/70, pulse 64, temperature 98.2 F (36.8 C), temperature source Oral, resp. rate 18, weight 96.2 kg (212 lb 1.8 oz), SpO2 98 %. No results found. Results for orders placed or performed during the hospital encounter of 05/11/17 (from the past 72 hour(s))  Glucose, capillary     Status: Abnormal   Collection Time: 05/11/17  4:38 PM  Result Value Ref Range   Glucose-Capillary 116 (H) 65 - 99 mg/dL  Glucose, capillary     Status: Abnormal   Collection Time: 05/11/17  9:12 PM  Result Value Ref Range   Glucose-Capillary 222 (H) 65 - 99 mg/dL  Glucose, capillary     Status: Abnormal   Collection Time: 05/12/17  6:25 AM  Result Value Ref Range   Glucose-Capillary 188 (H) 65 - 99 mg/dL  CBC WITH DIFFERENTIAL     Status: Abnormal   Collection Time: 05/12/17  7:53 AM  Result Value Ref Range   WBC 5.9 4.0 - 10.5 K/uL   RBC 3.45 (L) 4.22 - 5.81 MIL/uL   Hemoglobin 10.6 (L) 13.0 - 17.0 g/dL   HCT 33.1 (L) 39.0 - 52.0 %   MCV 95.9 78.0 - 100.0 fL   MCH 30.7 26.0 - 34.0 pg   MCHC 32.0 30.0 - 36.0 g/dL   RDW 13.3 11.5 - 15.5 %   Platelets 201 150 - 400 K/uL   Neutrophils Relative % 73 %   Neutro Abs 4.3 1.7 - 7.7 K/uL   Lymphocytes Relative 17 %   Lymphs Abs 1.0 0.7 - 4.0 K/uL   Monocytes Relative 7 %   Monocytes Absolute 0.4 0.1 - 1.0 K/uL   Eosinophils Relative 2 %   Eosinophils Absolute 0.1 0.0 - 0.7 K/uL   Basophils Relative 1 %   Basophils Absolute 0.0 0.0 - 0.1 K/uL    Comment: Performed at [facility], [address] [place], [place] [address]  Comprehensive metabolic panel     Status: Abnormal   Collection Time: 05/12/17  7:53 AM  Result Value Ref Range   Sodium 139 135 - 145 mmol/L   Potassium 4.1 3.5 - 5.1 mmol/L   Chloride 107 101 - 111 mmol/L   CO2 21 (L)  22 - 32 mmol/L   Glucose, Bld 219 (H) 65 - 99 mg/dL   BUN 20 6 - 20 mg/dL   Creatinine, Ser 1.80 (H) 0.61 - 1.24 mg/dL   Calcium 8.5 (L) 8.9 - 10.3 mg/dL   Total Protein 5.9 (L) 6.5 - 8.1 g/dL   Albumin 2.4 (L) 3.5 - 5.0 g/dL   AST 16 15 - 41 U/L   ALT 11 (L) 17 - 63 U/L   Alkaline Phosphatase 68 38 - 126 U/L   Total Bilirubin 0.5 0.3 - 1.2 mg/dL   GFR calc non Af Amer 35 (L) >60 mL/min   GFR calc Af Amer 40 (L) >60 mL/min    Comment: (NOTE) The eGFR has been calculated using the CKD EPI equation. This calculation has not been validated in all clinical situations. eGFR's persistently <60 mL/min signify possible Chronic Kidney Disease.    Anion gap 11 5 - 15    Comment: Performed at [facility] [address] [place], [place] [address]  Glucose, capillary     Status: Abnormal   Collection Time:  05/12/17 11:32 AM  Result Value Ref Range   Glucose-Capillary 269 (H) 65 - 99 mg/dL  Culture, Urine     Status: Abnormal   Collection Time: 05/12/17  1:05 PM  Result Value Ref Range   Specimen Description URINE, CLEAN CATCH    Special Requests      NONE Performed at [facility], [place] [address], [place], [place] [address]    Culture MULTIPLE SPECIES PRESENT, SUGGEST RECOLLECTION (A)    Report Status 05/13/2017 FINAL   Urinalysis, Routine w reflex microscopic     Status: Abnormal   Collection Time: 05/12/17  1:05 PM  Result Value Ref Range   Color, Urine YELLOW YELLOW   APPearance CLEAR CLEAR   Specific Gravity, Urine 1.015 1.005 - 1.030   pH 5.0 5.0 - 8.0   Glucose, UA 50 (A) NEGATIVE mg/dL   Hgb urine dipstick NEGATIVE NEGATIVE   Bilirubin Urine NEGATIVE NEGATIVE   Ketones, ur NEGATIVE NEGATIVE mg/dL   Protein, ur NEGATIVE NEGATIVE mg/dL   Nitrite NEGATIVE NEGATIVE   Leukocytes, UA NEGATIVE NEGATIVE    Comment: Performed at [facility] [address], [place], [place] [address]  Glucose, capillary     Status: None   Collection Time: 05/12/17  4:55 PM   Result Value Ref Range   Glucose-Capillary 80 65 - 99 mg/dL  Glucose, capillary     Status: Abnormal   Collection Time: 05/12/17  9:06 PM  Result Value Ref Range   Glucose-Capillary 111 (H) 65 - 99 mg/dL  Glucose, capillary     Status: Abnormal   Collection Time: 05/13/17  6:19 AM  Result Value Ref Range   Glucose-Capillary 133 (H) 65 - 99 mg/dL  Basic metabolic panel     Status: Abnormal   Collection Time: 05/13/17  6:53 AM  Result Value Ref Range   Sodium 139 135 - 145 mmol/L   Potassium 4.4 3.5 - 5.1 mmol/L   Chloride 108 101 - 111 mmol/L   CO2 21 (L) 22 - 32 mmol/L   Glucose, Bld 145 (H) 65 - 99 mg/dL   BUN 20 6 - 20 mg/dL   Creatinine, Ser 1.81 (H) 0.61 - 1.24 mg/dL   Calcium 8.7 (L) 8.9 - 10.3 mg/dL   GFR calc non Af Amer 34 (L) >60 mL/min   GFR calc Af Amer 40 (L) >60 mL/min    Comment: (NOTE) The eGFR has been calculated using the CKD EPI equation. This calculation has not been validated in all clinical situations. eGFR's persistently <60 mL/min signify possible Chronic Kidney Disease.    Anion gap 10 5 - 15    Comment: Performed at [facility] [address] [place], [place] [address]  Glucose, capillary     Status: Abnormal   Collection Time: 05/13/17 11:48 AM  Result Value Ref Range   Glucose-Capillary 150 (H) 65 - 99 mg/dL  Glucose, capillary     Status: Abnormal   Collection Time: 05/13/17  4:32 PM  Result Value Ref Range   Glucose-Capillary 106 (H) 65 - 99 mg/dL  Glucose, capillary     Status: None   Collection Time: 05/13/17  8:45 PM  Result Value Ref Range   Glucose-Capillary 96 65 - 99 mg/dL  Glucose, capillary     Status: Abnormal   Collection Time: 05/14/17  6:29 AM  Result Value Ref Range   Glucose-Capillary 126 (H) 65 - 99 mg/dL   Comment 1 Notify RN  General: NAD. Vital signs reviewed. HEENT: Normocephalic, atraumatic. Cardio: RRR and No JVD. Resp: CTA B/L and unlabored GI: BS positive and nondistended Musc/Skel:  No  tenderness. No edema. Neuro: Alert/Oriented Right facial weakness Motor 4+/5 RUE/RLE (stable) 5/5 in the left deltoid bicep tricep grip hip flexion knee extension ankle dorsiflexion Dysarthria Skin:   Intact. Warm and dry. Psych: Flat affect.   Assessment/Plan: 1. Functional deficits secondary to left MCA infarct with right hemiparesis which require 3+ hours per day of interdisciplinary therapy in a comprehensive inpatient rehab setting. Physiatrist is providing close team supervision and 24 hour management of active medical problems listed below. Physiatrist and rehab team continue to assess barriers to discharge/monitor patient progress toward functional and medical goals. FIM: Function - Bathing Position: Wheelchair/chair at sink Body parts bathed by patient: Right arm, Left arm, Chest, Abdomen, Front perineal area, Right upper leg, Left upper leg Body parts bathed by helper: Buttocks, Right lower leg, Left lower leg Assist Level: Touching or steadying assistance(Pt > 75%)  Function- Upper Body Dressing/Undressing What is the patient wearing?: Pull over shirt/dress Pull over shirt/dress - Perfomed by patient: Thread/unthread right sleeve, Thread/unthread left sleeve, Put head through opening Pull over shirt/dress - Perfomed by helper: Pull shirt over trunk Assist Level: Touching or steadying assistance(Pt > 75%) Function - Lower Body Dressing/Undressing What is the patient wearing?: Pants, Non-skid slipper socks Position: Wheelchair/chair at sink Pants- Performed by patient: Thread/unthread left pants leg, Pull pants up/down Pants- Performed by helper: Thread/unthread right pants leg Non-skid slipper socks- Performed by helper: Don/doff left sock, Don/doff right sock Socks - Performed by helper: Don/doff right sock, Don/doff left sock Shoes - Performed by helper: Don/doff right shoe, Don/doff left shoe, Fasten right, Fasten left Assist for footwear: Dependant Assist for lower  body dressing: Touching or steadying assistance (Pt > 75%)  Function - Toileting Toileting steps completed by helper: Adjust clothing prior to toileting, Performs perineal hygiene, Adjust clothing after toileting Assist level: Two helpers  Function - Air cabin crew transfer assistive device: Grab bar Assist level to toilet: Moderate assist (Pt 50 - 74%/lift or lower) Assist level from toilet: Moderate assist (Pt 50 - 74%/lift or lower)  Function - Chair/bed transfer Chair/bed transfer method: Stand pivot Chair/bed transfer assist level: Touching or steadying assistance (Pt > 75%) Chair/bed transfer assistive device: Armrests  Function - Locomotion: Wheelchair Type: Manual Max wheelchair distance: 171f  Assist Level: Moderate assistance (Pt 50 - 74%) Assist Level: Moderate assistance (Pt 50 - 74%) Wheel 150 feet activity did not occur: Safety/medical concerns Function - Locomotion: Ambulation Assistive device: No device Max distance: 120 Assist level: Touching or steadying assistance (Pt > 75%) Assist level: Touching or steadying assistance (Pt > 75%) Assist level: Touching or steadying assistance (Pt > 75%) Assist level: Touching or steadying assistance (Pt > 75%)  Function - Comprehension Comprehension: Auditory Comprehension assist level: Understands basic 75 - 89% of the time/ requires cueing 10 - 24% of the time  Function - Expression Expression: Verbal Expression assistive device: Communication board Expression assist level: Expresses basic 50 - 74% of the time/requires cueing 25 - 49% of the time. Needs to repeat parts of sentences.  Function - Social Interaction Social Interaction assist level: Interacts appropriately 75 - 89% of the time - Needs redirection for appropriate language or to initiate interaction.  Function - Problem Solving Problem solving assist level: Solves basic 75 - 89% of the time/requires cueing 10 - 24% of the time  Function -  Memory Memory assist level: Recognizes or recalls 50 - 74% of the time/requires cueing 25 - 49% of the time Patient normally able to recall (first 3 days only): Staff names and faces, That he or she is in a hospital, Current season  Medical Problem List and Plan:  1. Functional and cognitive deficits secondary to left MCA infarct    Continue CIR 2. DVT Prophylaxis/Anticoagulation: Pharmaceutical: Heparin.    Dopplers to rule out DVT pending on 3/8 3. Pain Management: tylenol prn  4. Mood: Appears depressed. Team to provide ego support. LCSW to follow for evaluation and support.  5. Neuropsych: This patient is not capable of making decisions on his own behalf.  6. Skin/Wound Care: routine pressure relief measures.  7. Fluids/Electrolytes/Nutrition: Monitor I/O.   8. Hypotension: Will continue to monitor BP. Continue midodrine and florinef for BP support.    Blood pressure labile on 3/17, orthostatics positive, TED hose +/- abdominal binder as needed 9. COPD: Continue Breo. Albuterol nebs prn.  Left perihilar density is likely from his lung CA.   10. T2DM: Will monitor BS ac/hs. Continue glipizide and trajenta with SSI for elevated BS.  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  12. CKD   Creatinine 1.81 on 3/7   Cont to monitor 13. Acute blood loss anemia   Hemoglobin 10.6 on 3/6   Continue to monitor   LOS (Days) 3 A FACE TO FACE EVALUATION WAS PERFORMED  Lauro Manlove Lorie Phenix 05/14/2017, 9:37 AM

## 2017-05-14 NOTE — Progress Notes (Signed)
Occupational Therapy Session Note  Patient Details  Name: Edwin Jones MRN: [id number] Date of Birth: [birth year]  Today's Date: 05/14/2017 OT Individual Time: 1030-1200 OT Individual Time Calculation (min): 90 min    Short Term Goals: Week 1:  OT Short Term Goal 1 (Week 1): Pt will be able to use R hand to bring a cup of water toward his mouth and drink without spilling the water. OT Short Term Goal 2 (Week 1): Pt will demonstrate improved processing by donning a shirt in 1-2 minutes (vs 5 minutes). OT Short Term Goal 3 (Week 1): Pt will demonstrate improved dynamic sitting balance by donning pants over R leg from a seated position with S.  OT Short Term Goal 4 (Week 1): Pt will demonstrate improved activity tolerance to stand at the sink with steadying A to complete oral care.  Skilled Therapeutic Interventions/Progress Updates:   Pt declined bathing/dressing this session and was already dressed for PT session earlier in the morning. OT treatment focused on functional use of R UE, B UE strengthening, and balance strategies.. Pt completed 5 mins x3 on Sci Fit arm bike on level 3 with min to maintain grasp on handle with R. Extended rest breaks between sets. Addressed Functional use of R UE and problem solving skills with Connect 4 game. Pt needed 2 practice rounds to understand concept initially. Worked on [employer] with translation and rotation of chips. Dynamic standing balance, B UE coordination, and R attention with standing dynavision activity using alternating B UEs. Pt needed min/mod A for balance with 1 overt LOB requiring Max A to correct. Pt reported need for bathroom and completed stand-pivot to commode with min to L and Mod A to R. Pt was already incontinent of urine but voided a little more in commode. Pt needed min/mod A for balance while completing toileting task. Pt returned to wc and set-up for lunch with chair alarm and safety belt on. Call bell in lap.   Therapy  Documentation Precautions:  Precautions Precautions: Fall Precaution Comments: low blood pressure Restrictions Weight Bearing Restrictions: No Pain: Pain Assessment Pain Assessment: 0-10 Pain Score: 0-No pain ADL: ADL ADL Comments: refer to functional navigator  See Function Navigator for Current Functional Status.   Therapy/Group: Individual Therapy  Valma Cava 05/14/2017, 10:52 AM

## 2017-05-14 NOTE — Progress Notes (Signed)
Physical Therapy Session Note  Patient Details  Name: Edwin Jones MRN: [id number] Date of Birth: [birth year]  Today's Date: 05/14/2017 PT Individual Time: 0900-1000 PT Individual Time Calculation (min): 60 min   Short Term Goals: Week 1:  PT Short Term Goal 1 (Week 1): Pt will perform bed mobility without assist, but increased time.  PT Short Term Goal 2 (Week 1): Pt will ambulate 151ft with contact guard assist  PT Short Term Goal 3 (Week 1): Pt will propell WC 134ft with min assist  PT Short Term Goal 4 (Week 1): Pt will perform stand pivot transfers with supervision assist   Skilled Therapeutic Interventions/Progress Updates:    no c/o pain.  Session focus on sitting/standing balance, and NMR for L weight shift to improve RLE swing through during gait.    Pt transitions to EOB with supervision and increased time.  Lower body dressing from EOB with mod multimodal cues to maintain forward weight shift, intermittent mod assist to prevent LOB.  Sit<>stand throughout session with close supervision>min guard, with verbal and occasional tactile cues for forward weight shift.  Gait to therapy gym with min assist, therapist facilitating increased L weight shift with tactile cues at pt's pelvis.  NMR at 6" steps with 2 handrails with R step ups 2 trials to fatigue with focus on L weight shift.  Progress to step ups to 6" step with forced L weight shift to tap hip to target on // bars, 2 trials to fatigue.  Pt requesting to return to room to toilet.  Continues to require min facilitation for weight shift for improved R swing through due to urgency.  Pt requires min assist for toilet transfer and close supervision to min guard for balance while he completes 3/3 toileting steps.  Positioned in w/c with QRB in place, chair alarm activated, and call bell in reach.   Therapy Documentation Precautions:  Precautions Precautions: Fall Precaution Comments: low blood pressure Restrictions Weight Bearing  Restrictions: No   See Function Navigator for Current Functional Status.   Therapy/Group: Individual Therapy  Michel Santee 05/14/2017, 12:02 PM

## 2017-05-14 NOTE — Progress Notes (Signed)
Speech Language Pathology Daily Session Note  Patient Details  Name: Edwin Jones MRN: [id number] Date of Birth: [birth year]  Today's Date: 05/14/2017 SLP Individual Time: 1561-5379 SLP Individual Time Calculation (min): 40 min  Short Term Goals: Week 1: SLP Short Term Goal 1 (Week 1): Pt will utilize word finding strategies toname common objects with 95% accuracy and Min A cues.  SLP Short Term Goal 2 (Week 1): Pt will self-monitor and self-correct verbal responses with Mod A cues.  SLP Short Term Goal 3 (Week 1): Pt will demonstrate selective attention in moderately distracting environment for ~30 minutes with supervision cues.  SLP Short Term Goal 4 (Week 1): Pt will follow 2 step directions iwth 75% accuracy and Mod A cues.  SLP Short Term Goal 5 (Week 1): Pt will utilize speech intelligibility strategies (increased vocal intensity) to achieve ~ 50% intelligibility at the phrase level with Mod A cues.   Skilled Therapeutic Interventions: Skilled treatment session focused on communication goals. SLP facilitated session by providing Min A phonemic and sentence completion cues for word-finding while decoding at the phrase level. Patient also required Max A verbal and visual cues for use of speech intelligibility strategies to maximize intelligibly to 75% at the phrase level. Patient transferred back to bed at end of session. Continue with current plan of care.      Function:   Cognition Comprehension Comprehension assist level: Understands basic 75 - 89% of the time/ requires cueing 10 - 24% of the time  Expression   Expression assist level: Expresses basic 50 - 74% of the time/requires cueing 25 - 49% of the time. Needs to repeat parts of sentences.  Social Interaction Social Interaction assist level: Interacts appropriately 50 - 74% of the time - May be physically or verbally inappropriate.  Problem Solving Problem solving assist level: Solves basic 50 - 74% of the time/requires cueing  25 - 49% of the time  Memory Memory assist level: Recognizes or recalls 50 - 74% of the time/requires cueing 25 - 49% of the time    Pain No/Denies Pain   Therapy/Group: Individual Therapy  Serenna Deroy 05/14/2017, 3:08 PM

## 2017-05-14 NOTE — Plan of Care (Signed)
  Progressing Spiritual Needs Ability to function at adequate level 05/14/2017 1308 - Progressing by Claude Manges, LPN Consults [facility] STROKE PATIENT EDUCATION Description See Patient Education module for education specifics  05/14/2017 1308 - Progressing by Claude Manges, LPN Diabetes Guidelines if Diabetic/Glucose > 140 Description If diabetic or lab glucose is > 140 mg/dl - Initiate Diabetes/Hyperglycemia Guidelines & Document Interventions  05/14/2017 1308 - Progressing by Claude Manges, LPN RH BOWEL ELIMINATION RH STG MANAGE BOWEL WITH ASSISTANCE Description STG Manage Bowel with min Assistance.  05/14/2017 1308 - Progressing by Claude Manges, LPN RH STG MANAGE BOWEL W/MEDICATION W/ASSISTANCE Description STG Manage Bowel with Medication with min Assistance.  05/14/2017 1308 - Progressing by Claude Manges, LPN RH BLADDER ELIMINATION RH STG MANAGE BLADDER WITH ASSISTANCE Description STG Manage Bladder With min Assistance  05/14/2017 1308 - Progressing by Claude Manges, LPN RH STG MANAGE BLADDER WITH EQUIPMENT WITH ASSISTANCE Description STG Manage Bladder With Equipment With min Assistance  05/14/2017 1308 - Progressing by Claude Manges, LPN RH SKIN INTEGRITY RH STG SKIN FREE OF INFECTION/BREAKDOWN Description Patients skin will remain free from further infection or breakdown with min assist.  05/14/2017 1308 - Progressing by Claude Manges, LPN RH STG MAINTAIN SKIN INTEGRITY WITH ASSISTANCE Description STG Maintain Skin Integrity With min Assistance.  05/14/2017 1308 - Progressing by Claude Manges, LPN RH SAFETY RH STG ADHERE TO SAFETY PRECAUTIONS W/ASSISTANCE/DEVICE Description STG Adhere to Safety Precautions With min Assistance/Device.  05/14/2017 1308 - Progressing by Claude Manges, LPN RH COGNITION-NURSING RH STG USES MEMORY AIDS/STRATEGIES W/ASSIST TO PROBLEM SOLVE Description STG Uses Memory Aids/Strategies With min Assistance to  Problem Solve.  05/14/2017 1308 - Progressing by Claude Manges, LPN RH KNOWLEDGE DEFICIT RH STG INCREASE KNOWLEDGE OF DIABETES 05/14/2017 1308 - Progressing by Claude Manges, LPN RH STG INCREASE KNOWLEDGE OF HYPERTENSION 05/14/2017 1308 - Progressing by Claude Manges, LPN RH Vision RH LTG Vision (Specify) 05/14/2017 1308 - Progressing by Claude Manges, LPN

## 2017-05-15 ENCOUNTER — Inpatient Hospital Stay (HOSPITAL_COMMUNITY): Payer: Medicare Other | Admitting: Occupational Therapy

## 2017-05-15 ENCOUNTER — Inpatient Hospital Stay (HOSPITAL_COMMUNITY): Payer: Medicare Other | Admitting: Physical Therapy

## 2017-05-15 ENCOUNTER — Inpatient Hospital Stay (HOSPITAL_COMMUNITY): Payer: Medicare Other

## 2017-05-15 LAB — GLUCOSE, CAPILLARY
GLUCOSE-CAPILLARY: 75 mg/dL (ref 65–99)
Glucose-Capillary: 147 mg/dL — ABNORMAL HIGH (ref 65–99)
Glucose-Capillary: 101 mg/dL — ABNORMAL HIGH (ref 65–99)
Glucose-Capillary: 120 mg/dL — ABNORMAL HIGH (ref 65–99)

## 2017-05-15 NOTE — Progress Notes (Addendum)
Occupational Therapy Session Note  Patient Details  Name: Edwin Jones MRN: [id number] Date of Birth: 10/14/1940  Today's Date: 05/15/2017 OT Individual Time: 0900-1000; 1500-1530 OT Individual Time Calculation (min): 60 min ; 30 mins   Short Term Goals: Week 1:  OT Short Term Goal 1 (Week 1): Pt will be able to use R hand to bring a cup of water toward his mouth and drink without spilling the water. OT Short Term Goal 2 (Week 1): Pt will demonstrate improved processing by donning a shirt in 1-2 minutes (vs 5 minutes). OT Short Term Goal 3 (Week 1): Pt will demonstrate improved dynamic sitting balance by donning pants over R leg from a seated position with S.  OT Short Term Goal 4 (Week 1): Pt will demonstrate improved activity tolerance to stand at the sink with steadying A to complete oral care.  Skilled Therapeutic Interventions/Progress Updates:    Session 1: Treatment session focused on ADLs/Self care training, balance training, transfer training, NMR, and pt/caregiver education. Upon entering room , pt supine in bed resting and agreeable to AM ADLs. Pt moved from supine to EOB with S and v/c for hand/foot placement. Pt declined shower this AM and requested to do sponge bath at sink. Pt transferred from EOB to w/c with minA  And positioned at sink. Pt completed UB ADLs and engaged both UE for tasks. Pt noted to have delayed motor planning and processing and required additional v prompts throughout dressing task. Pt completed LB ADLs at sinkside in sit<>stand level. Pt tolerated standing for up to 3 minutes to completed washing task with CGA. Pt completed LB dressing with mod A, unable to don socks. Pts wife was present and therapist educated pt/caregiver on affects of CVA that impact ADLs. Pt left resting in w/c with needs met with wife present to visit.   Session 2: Treatment focused on there activities for [facility] and dexterity for NMR in R UE. Pt participated in there activity at table top  level for functional reaching, object manipulation, and motor planning to increase [facility] skills. Pt noted to be able to pick up cards and small objects with extra time for functional use with F+ coordination. Continue to address this area of weakness to improve overall coordination to complete functional tasks. Pt returned to room and transferred from bed>w/c with CGA. Pt repositioned self in bed with S and left resting with all needs met.   Therapy Documentation Precautions:  Precautions Precautions: Fall Precaution Comments: low blood pressure Restrictions Weight Bearing Restrictions: No Vital Signs: Oxygen Therapy SpO2: 94 % O2 Device: Room Air Pain: Pain Assessment Pain Assessment: No/denies pain ADL: ADL ADL Comments: refer to functional navigator    See Function Navigator for Current Functional Status.   Therapy/Group: Individual Therapy  Delon Sacramento 05/15/2017, 12:20 PM

## 2017-05-15 NOTE — Progress Notes (Signed)
Speech Language Pathology Daily Session Note  Patient Details  Name: Edwin Jones MRN: [id number] Date of Birth: Jan 04, 1941  Today's Date: 05/15/2017 SLP Individual Time: 8677-3736 SLP Individual Time Calculation (min): 43 min  Short Term Goals: Week 1: SLP Short Term Goal 1 (Week 1): Pt will utilize word finding strategies toname common objects with 95% accuracy and Min A cues.  SLP Short Term Goal 2 (Week 1): Pt will self-monitor and self-correct verbal responses with Mod A cues.  SLP Short Term Goal 3 (Week 1): Pt will demonstrate selective attention in moderately distracting environment for ~30 minutes with supervision cues.  SLP Short Term Goal 4 (Week 1): Pt will follow 2 step directions iwth 75% accuracy and Mod A cues.  SLP Short Term Goal 5 (Week 1): Pt will utilize speech intelligibility strategies (increased vocal intensity) to achieve ~ 50% intelligibility at the phrase level with Mod A cues.   Skilled Therapeutic Interventions: Pt seen this date to address speech and language goals. SLP facilitated session by providing Max A verbal + visual cues to complete confrontational naming task for items in [facility] kit. Followed 2-step commands with 60% accuracy without cues; improved to 90% accuracy given Mod-Max A visual cues. Receptively ID'ed appropriate word from field of 2 on 3 out of 4 opportunities given Min-Mod visual cue of object. At phrase level, speech intelligibility improved from ~35% to 50-60% given Max A verbal + model cues to increase vocal intensity. Pt left in wc with quick release belt donned and wife present; all needs within reach. SLP to continue current POC.    Function:  Eating Eating     Eating Assist Level: Set up assist for;More than reasonable amount of time           Cognition Comprehension Comprehension assist level: Understands basic 75 - 89% of the time/ requires cueing 10 - 24% of the time  Expression   Expression assist level: Expresses basic 25 -  49% of the time/requires cueing 50 - 75% of the time. Uses single words/gestures.  Social Interaction Social Interaction assist level: Interacts appropriately 50 - 74% of the time - May be physically or verbally inappropriate.  Problem Solving Problem solving assist level: Solves basic 50 - 74% of the time/requires cueing 25 - 49% of the time  Memory Memory assist level: Recognizes or recalls 50 - 74% of the time/requires cueing 25 - 49% of the time    Pain Pain Assessment Pain Assessment: No/denies pain  Therapy/Group: Individual Therapy  [name] 05/15/2017, 12:53 PM

## 2017-05-15 NOTE — Progress Notes (Signed)
Subjective/Complaints: Patient seen lying in bed this morning. He states that well overnight. He denies complaints.  Review of systems: Denies CP, SOB, nausea, vomiting, diarrhea.  Objective: Vital Signs: Blood pressure 131/71, pulse 65, temperature 98.3 F (36.8 C), temperature source Oral, resp. rate 18, weight 95 kg (209 lb 7 oz), SpO2 96 %. No results found. Results for orders placed or performed during the hospital encounter of 05/11/17 (from the past 72 hour(s))  CBC WITH DIFFERENTIAL     Status: Abnormal   Collection Time: 05/12/17  7:53 AM  Result Value Ref Range   WBC 5.9 4.0 - 10.5 K/uL   RBC 3.45 (L) 4.22 - 5.81 MIL/uL   Hemoglobin 10.6 (L) 13.0 - 17.0 g/dL   HCT 33.1 (L) 39.0 - 52.0 %   MCV 95.9 78.0 - 100.0 fL   MCH 30.7 26.0 - 34.0 pg   MCHC 32.0 30.0 - 36.0 g/dL   RDW 13.3 11.5 - 15.5 %   Platelets 201 150 - 400 K/uL   Neutrophils Relative % 73 %   Neutro Abs 4.3 1.7 - 7.7 K/uL   Lymphocytes Relative 17 %   Lymphs Abs 1.0 0.7 - 4.0 K/uL   Monocytes Relative 7 %   Monocytes Absolute 0.4 0.1 - 1.0 K/uL   Eosinophils Relative 2 %   Eosinophils Absolute 0.1 0.0 - 0.7 K/uL   Basophils Relative 1 %   Basophils Absolute 0.0 0.0 - 0.1 K/uL    Comment: Performed at [facility], [address], [place], [place] [address]  Comprehensive metabolic panel     Status: Abnormal   Collection Time: 05/12/17  7:53 AM  Result Value Ref Range   Sodium 139 135 - 145 mmol/L   Potassium 4.1 3.5 - 5.1 mmol/L   Chloride 107 101 - 111 mmol/L   CO2 21 (L) 22 - 32 mmol/L   Glucose, Bld 219 (H) 65 - 99 mg/dL   BUN 20 6 - 20 mg/dL   Creatinine, Ser 1.80 (H) 0.61 - 1.24 mg/dL   Calcium 8.5 (L) 8.9 - 10.3 mg/dL   Total Protein 5.9 (L) 6.5 - 8.1 g/dL   Albumin 2.4 (L) 3.5 - 5.0 g/dL   AST 16 15 - 41 U/L   ALT 11 (L) 17 - 63 U/L   Alkaline Phosphatase 68 38 - 126 U/L   Total Bilirubin 0.5 0.3 - 1.2 mg/dL   GFR calc non Af Amer 35 (L) >60 mL/min   GFR calc Af Amer 40 (L) >60  mL/min    Comment: (NOTE) The eGFR has been calculated using the CKD EPI equation. This calculation has not been validated in all clinical situations. eGFR's persistently <60 mL/min signify possible Chronic Kidney Disease.    Anion gap 11 5 - 15    Comment: Performed at [facility] [address], [place], [place] [address]  Glucose, capillary     Status: Abnormal   Collection Time: 05/12/17 11:32 AM  Result Value Ref Range   Glucose-Capillary 269 (H) 65 - 99 mg/dL  Culture, Urine     Status: Abnormal   Collection Time: 05/12/17  1:05 PM  Result Value Ref Range   Specimen Description URINE, CLEAN CATCH    Special Requests      NONE Performed at [facility], [place] [address], [place], [place] [address]    Culture MULTIPLE SPECIES PRESENT, SUGGEST RECOLLECTION (A)    Report Status 05/13/2017 FINAL   Urinalysis, Routine  w reflex microscopic     Status: Abnormal   Collection Time: 05/12/17  1:05 PM  Result Value Ref Range   Color, Urine YELLOW YELLOW   APPearance CLEAR CLEAR   Specific Gravity, Urine 1.015 1.005 - 1.030   pH 5.0 5.0 - 8.0   Glucose, UA 50 (A) NEGATIVE mg/dL   Hgb urine dipstick NEGATIVE NEGATIVE   Bilirubin Urine NEGATIVE NEGATIVE   Ketones, ur NEGATIVE NEGATIVE mg/dL   Protein, ur NEGATIVE NEGATIVE mg/dL   Nitrite NEGATIVE NEGATIVE   Leukocytes, UA NEGATIVE NEGATIVE    Comment: Performed at [facility] [address], [place], [place] [address]  Glucose, capillary     Status: None   Collection Time: 05/12/17  4:55 PM  Result Value Ref Range   Glucose-Capillary 80 65 - 99 mg/dL  Glucose, capillary     Status: Abnormal   Collection Time: 05/12/17  9:06 PM  Result Value Ref Range   Glucose-Capillary 111 (H) 65 - 99 mg/dL  Glucose, capillary     Status: Abnormal   Collection Time: 05/13/17  6:19 AM  Result Value Ref Range   Glucose-Capillary 133 (H) 65 - 99 mg/dL  Basic metabolic panel     Status: Abnormal   Collection Time: 05/13/17   6:53 AM  Result Value Ref Range   Sodium 139 135 - 145 mmol/L   Potassium 4.4 3.5 - 5.1 mmol/L   Chloride 108 101 - 111 mmol/L   CO2 21 (L) 22 - 32 mmol/L   Glucose, Bld 145 (H) 65 - 99 mg/dL   BUN 20 6 - 20 mg/dL   Creatinine, Ser 1.81 (H) 0.61 - 1.24 mg/dL   Calcium 8.7 (L) 8.9 - 10.3 mg/dL   GFR calc non Af Amer 34 (L) >60 mL/min   GFR calc Af Amer 40 (L) >60 mL/min    Comment: (NOTE) The eGFR has been calculated using the CKD EPI equation. This calculation has not been validated in all clinical situations. eGFR's persistently <60 mL/min signify possible Chronic Kidney Disease.    Anion gap 10 5 - 15    Comment: Performed at [facility] [address] [place], [place] [address]  Glucose, capillary     Status: Abnormal   Collection Time: 05/13/17 11:48 AM  Result Value Ref Range   Glucose-Capillary 150 (H) 65 - 99 mg/dL  Glucose, capillary     Status: Abnormal   Collection Time: 05/13/17  4:32 PM  Result Value Ref Range   Glucose-Capillary 106 (H) 65 - 99 mg/dL  Glucose, capillary     Status: None   Collection Time: 05/13/17  8:45 PM  Result Value Ref Range   Glucose-Capillary 96 65 - 99 mg/dL  Glucose, capillary     Status: Abnormal   Collection Time: 05/14/17  6:29 AM  Result Value Ref Range   Glucose-Capillary 126 (H) 65 - 99 mg/dL   Comment 1 Notify RN   Glucose, capillary     Status: Abnormal   Collection Time: 05/14/17 11:54 AM  Result Value Ref Range   Glucose-Capillary 145 (H) 65 - 99 mg/dL  Glucose, capillary     Status: None   Collection Time: 05/14/17  4:29 PM  Result Value Ref Range   Glucose-Capillary 73 65 - 99 mg/dL  Glucose, capillary     Status: Abnormal   Collection Time: 05/14/17  8:42 PM  Result Value Ref Range   Glucose-Capillary 168 (H) 65 - 99 mg/dL  Comment 1 Notify RN   Glucose, capillary     Status: Abnormal   Collection Time: 05/15/17  6:34 AM  Result Value Ref Range   Glucose-Capillary 147 (H) 65 - 99 mg/dL   Comment 1  Notify RN      General: NAD. Vital signs reviewed. HEENT: Normocephalic, atraumatic. Cardio: RRR and No JVD. Resp: CTA B/L and unlaboured GI: BS positive and nondistended Musc/Skel:  No tenderness. No edema. Neuro: Alert/Oriented Right facial weakness Motor 4+/5 RUE/RLE (unchanged) 5/5 in the left deltoid bicep tricep grip hip flexion knee extension ankle dorsiflexion Dysarthria Skin:   Intact. Warm and dry. Psych: Flat affect.   Assessment/Plan: 1. Functional deficits secondary to left MCA infarct with right hemiparesis which require 3+ hours per day of interdisciplinary therapy in a comprehensive inpatient rehab setting. Physiatrist is providing close team supervision and 24 hour management of active medical problems listed below. Physiatrist and rehab team continue to assess barriers to discharge/monitor patient progress toward functional and medical goals. FIM: Function - Bathing Position: Wheelchair/chair at sink Body parts bathed by patient: Right arm, Left arm, Chest, Abdomen, Front perineal area, Right upper leg, Left upper leg Body parts bathed by helper: Buttocks, Right lower leg, Left lower leg Assist Level: Touching or steadying assistance(Pt > 75%)  Function- Upper Body Dressing/Undressing What is the patient wearing?: Pull over shirt/dress Pull over shirt/dress - Perfomed by patient: Thread/unthread right sleeve, Thread/unthread left sleeve, Put head through opening Pull over shirt/dress - Perfomed by helper: Pull shirt over trunk Assist Level: Touching or steadying assistance(Pt > 75%) Function - Lower Body Dressing/Undressing What is the patient wearing?: Pants, Non-skid slipper socks Position: Wheelchair/chair at sink Pants- Performed by patient: Thread/unthread left pants leg, Pull pants up/down Pants- Performed by helper: Thread/unthread right pants leg Non-skid slipper socks- Performed by helper: Don/doff left sock, Don/doff right sock Socks - Performed by  helper: Don/doff right sock, Don/doff left sock Shoes - Performed by helper: Don/doff right shoe, Don/doff left shoe, Fasten right, Fasten left Assist for footwear: Dependant Assist for lower body dressing: Touching or steadying assistance (Pt > 75%)  Function - Toileting Toileting steps completed by helper: Adjust clothing prior to toileting, Performs perineal hygiene, Adjust clothing after toileting Assist level: Two helpers  Function - Air cabin crew transfer assistive device: Grab bar Assist level to toilet: Moderate assist (Pt 50 - 74%/lift or lower) Assist level from toilet: Moderate assist (Pt 50 - 74%/lift or lower)  Function - Chair/bed transfer Chair/bed transfer method: Ambulatory Chair/bed transfer assist level: Touching or steadying assistance (Pt > 75%) Chair/bed transfer assistive device: Armrests  Function - Locomotion: Wheelchair Type: Manual Max wheelchair distance: 172f  Assist Level: Moderate assistance (Pt 50 - 74%) Assist Level: Moderate assistance (Pt 50 - 74%) Wheel 150 feet activity did not occur: Safety/medical concerns Function - Locomotion: Ambulation Assistive device: No device Max distance: 150 Assist level: Touching or steadying assistance (Pt > 75%) Assist level: Touching or steadying assistance (Pt > 75%) Assist level: Touching or steadying assistance (Pt > 75%) Assist level: Touching or steadying assistance (Pt > 75%)  Function - Comprehension Comprehension: Auditory Comprehension assist level: Understands basic 75 - 89% of the time/ requires cueing 10 - 24% of the time  Function - Expression Expression: Verbal Expression assist level: Expresses basic 50 - 74% of the time/requires cueing 25 - 49% of the time. Needs to repeat parts of sentences.  Function - Social Interaction Social Interaction assist level: Interacts appropriately 50 - 74%  of the time - May be physically or verbally inappropriate.  Function - Problem  Solving Problem solving assist level: Solves basic 50 - 74% of the time/requires cueing 25 - 49% of the time  Function - Memory Memory assist level: Recognizes or recalls 50 - 74% of the time/requires cueing 25 - 49% of the time Patient normally able to recall (first 3 days only): That he or she is in a hospital, Location of own room, Current season  Medical Problem List and Plan:  1. Functional and cognitive deficits secondary to left MCA infarct    Continue CIR 2. DVT Prophylaxis/Anticoagulation: Pharmaceutical: Heparin.  3. Pain Management: tylenol prn  4. Mood: Appears depressed. Team to provide ego support. LCSW to follow for evaluation and support.  5. Neuropsych: This patient is not capable of making decisions on his own behalf.  6. Skin/Wound Care: routine pressure relief measures.  7. Fluids/Electrolytes/Nutrition: Monitor I/O.   8. Hypotension: Will continue to monitor BP. Continue midodrine and florinef for BP support.    Blood pressure labile on 3/9, orthostatics positive, TED hose +/- abdominal binder as needed 9. COPD: Continue Breo. Albuterol nebs prn.  Left perihilar density is likely from his lung CA.   10. T2DM: Will monitor BS ac/hs. Continue glipizide and trajenta with SSI for elevated BS.  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  12. CKD   Creatinine 1.81 on 3/7   Cont to monitor 13. Acute blood loss anemia   Hemoglobin 10.6 on 3/6   Continue to monitor   LOS (Days) 4 A FACE TO FACE EVALUATION WAS PERFORMED  Mandeep Ferch Lorie Phenix 05/15/2017, 7:48 AM

## 2017-05-15 NOTE — Progress Notes (Signed)
Physical Therapy Session Note  Patient Details  Name: Meer Reindl MRN: [id number] Date of Birth: [birth year]  Today's Date: 05/15/2017 PT Individual Time: 1300-1400 PT Individual Time Calculation (min): 60 min   Short Term Goals: Week 1:  PT Short Term Goal 1 (Week 1): Pt will perform bed mobility without assist, but increased time.  PT Short Term Goal 2 (Week 1): Pt will ambulate 159ft with contact guard assist  PT Short Term Goal 3 (Week 1): Pt will propell WC 142ft with min assist  PT Short Term Goal 4 (Week 1): Pt will perform stand pivot transfers with supervision assist   Skilled Therapeutic Interventions/Progress Updates:    Pt seated in w/c upon therapist arrival, pt agreeable to participate in therapy session. Pt reports no pain this PM. Ambulation 3 x 150 ft with min assist, manual cues at hips for weight shift to the L during gait. Pt exhibits path deviation to the R as well as lean to the R side and shuffling gait pattern. Sit to stand 3 x 5 reps to 1" step under RLE to encourage weight shift to the L during transfer from progressively lower mat, min assist, use of mirror for visual feedback. Standing balance and weight shift on Biodex limits of stability activity level 1, manual cues at hip for proper multidirectional weight shift in conjunction with visual cues. Pt needs increased cues for weight shift to the left but requires less cueing with more trials of activity. Pt left seated in w/c in room with needs in reach, chair alarm and QRB in place.  Therapy Documentation Precautions:  Precautions Precautions: Fall Precaution Comments: low blood pressure Restrictions Weight Bearing Restrictions: No  See Function Navigator for Current Functional Status.   Therapy/Group: Individual Therapy  Excell Seltzer, PT, DPT  05/15/2017, 3:04 PM

## 2017-05-16 ENCOUNTER — Inpatient Hospital Stay (HOSPITAL_COMMUNITY): Payer: Medicare Other

## 2017-05-16 DIAGNOSIS — R05 Cough: Secondary | ICD-10-CM

## 2017-05-16 DIAGNOSIS — R509 Fever, unspecified: Secondary | ICD-10-CM

## 2017-05-16 DIAGNOSIS — R059 Cough, unspecified: Secondary | ICD-10-CM

## 2017-05-16 LAB — CBC WITH DIFFERENTIAL/PLATELET
BASOS PCT: 1 %
Basophils Absolute: 0 10*3/uL (ref 0.0–0.1)
EOS ABS: 0.2 10*3/uL (ref 0.0–0.7)
Eosinophils Relative: 3 %
HCT: 34.9 % — ABNORMAL LOW (ref 39.0–52.0)
HEMOGLOBIN: 11.3 g/dL — AB (ref 13.0–17.0)
Lymphocytes Relative: 10 %
Lymphs Abs: 0.7 10*3/uL (ref 0.7–4.0)
MCH: 30.8 pg (ref 26.0–34.0)
MCHC: 32.4 g/dL (ref 30.0–36.0)
MCV: 95.1 fL (ref 78.0–100.0)
MONOS PCT: 10 %
Monocytes Absolute: 0.6 10*3/uL (ref 0.1–1.0)
NEUTROS PCT: 76 %
Neutro Abs: 5 10*3/uL (ref 1.7–7.7)
Platelets: 292 10*3/uL (ref 150–400)
RBC: 3.67 MIL/uL — ABNORMAL LOW (ref 4.22–5.81)
RDW: 12.9 % (ref 11.5–15.5)
WBC: 6.4 10*3/uL (ref 4.0–10.5)

## 2017-05-16 LAB — GLUCOSE, CAPILLARY
GLUCOSE-CAPILLARY: 151 mg/dL — AB (ref 65–99)
GLUCOSE-CAPILLARY: 85 mg/dL (ref 65–99)
GLUCOSE-CAPILLARY: 85 mg/dL (ref 65–99)
Glucose-Capillary: 141 mg/dL — ABNORMAL HIGH (ref 65–99)

## 2017-05-16 LAB — URINALYSIS, ROUTINE W REFLEX MICROSCOPIC
Bilirubin Urine: NEGATIVE
GLUCOSE, UA: NEGATIVE mg/dL
HGB URINE DIPSTICK: NEGATIVE
Ketones, ur: NEGATIVE mg/dL
LEUKOCYTES UA: NEGATIVE
Nitrite: NEGATIVE
PH: 5 (ref 5.0–8.0)
Protein, ur: NEGATIVE mg/dL
SPECIFIC GRAVITY, URINE: 1.02 (ref 1.005–1.030)

## 2017-05-16 NOTE — Progress Notes (Signed)
Subjective/Complaints: Patient seen lying in bed this morning. States he slept well overnight.   Review of systems: Denies CP, SOB, nausea, vomiting, diarrhea.  Objective: Vital Signs: Blood pressure 140/63, pulse 63, temperature 98.7 F (37.1 C), temperature source Oral, resp. rate 16, weight 95 kg (209 lb 7 oz), SpO2 97 %. No results found. Results for orders placed or performed during the hospital encounter of 05/11/17 (from the past 72 hour(s))  Glucose, capillary     Status: Abnormal   Collection Time: 05/13/17 11:48 AM  Result Value Ref Range   Glucose-Capillary 150 (H) 65 - 99 mg/dL  Glucose, capillary     Status: Abnormal   Collection Time: 05/13/17  4:32 PM  Result Value Ref Range   Glucose-Capillary 106 (H) 65 - 99 mg/dL  Glucose, capillary     Status: None   Collection Time: 05/13/17  8:45 PM  Result Value Ref Range   Glucose-Capillary 96 65 - 99 mg/dL  Glucose, capillary     Status: Abnormal   Collection Time: 05/14/17  6:29 AM  Result Value Ref Range   Glucose-Capillary 126 (H) 65 - 99 mg/dL   Comment 1 Notify RN   Glucose, capillary     Status: Abnormal   Collection Time: 05/14/17 11:54 AM  Result Value Ref Range   Glucose-Capillary 145 (H) 65 - 99 mg/dL  Glucose, capillary     Status: None   Collection Time: 05/14/17  4:29 PM  Result Value Ref Range   Glucose-Capillary 73 65 - 99 mg/dL  Glucose, capillary     Status: Abnormal   Collection Time: 05/14/17  8:42 PM  Result Value Ref Range   Glucose-Capillary 168 (H) 65 - 99 mg/dL   Comment 1 Notify RN   Glucose, capillary     Status: Abnormal   Collection Time: 05/15/17  6:34 AM  Result Value Ref Range   Glucose-Capillary 147 (H) 65 - 99 mg/dL   Comment 1 Notify RN   Glucose, capillary     Status: Abnormal   Collection Time: 05/15/17 12:03 PM  Result Value Ref Range   Glucose-Capillary 101 (H) 65 - 99 mg/dL  Glucose, capillary     Status: None   Collection Time: 05/15/17  5:01 PM  Result Value Ref  Range   Glucose-Capillary 75 65 - 99 mg/dL  Glucose, capillary     Status: Abnormal   Collection Time: 05/15/17  9:03 PM  Result Value Ref Range   Glucose-Capillary 120 (H) 65 - 99 mg/dL   Comment 1 Notify RN   Glucose, capillary     Status: Abnormal   Collection Time: 05/16/17  6:37 AM  Result Value Ref Range   Glucose-Capillary 141 (H) 65 - 99 mg/dL   Comment 1 Notify RN      General: NAD. Vital signs reviewed. HEENT: Normocephalic, atraumatic. Cardio: RRR and No JVD. Resp: CTA B/L and unlabored GI: BS positive and nondistended Musc/Skel:  No tenderness. No edema. Neuro: Alert/Oriented Right facial weakness Motor 4+/5 RUE/RLE (stable) 5/5 in the left deltoid bicep tricep grip hip flexion knee extension ankle dorsiflexion Dysarthria Skin:   Intact. Warm and dry. Psych: Flat affect.   Assessment/Plan: 1. Functional deficits secondary to left MCA infarct with right hemiparesis which require 3+ hours per day of interdisciplinary therapy in a comprehensive inpatient rehab setting. Physiatrist is providing close team supervision and 24 hour management of active medical problems listed below. Physiatrist and rehab team continue to assess barriers to discharge/monitor patient progress  toward functional and medical goals. FIM: Function - Bathing Position: Wheelchair/chair at sink Body parts bathed by patient: Right arm, Left arm, Chest, Abdomen, Front perineal area, Right upper leg, Left upper leg, Buttocks Body parts bathed by helper: Right lower leg, Left lower leg, Back Assist Level: Touching or steadying assistance(Pt > 75%)  Function- Upper Body Dressing/Undressing What is the patient wearing?: Pull over shirt/dress Pull over shirt/dress - Perfomed by patient: Thread/unthread right sleeve, Thread/unthread left sleeve, Put head through opening Pull over shirt/dress - Perfomed by helper: Pull shirt over trunk Assist Level: Touching or steadying assistance(Pt > 75%) Function -  Lower Body Dressing/Undressing What is the patient wearing?: Pants, Non-skid slipper socks Position: Wheelchair/chair at sink Pants- Performed by patient: Thread/unthread left pants leg, Pull pants up/down Pants- Performed by helper: Thread/unthread right pants leg, Fasten/unfasten pants Non-skid slipper socks- Performed by helper: Don/doff left sock, Don/doff right sock Socks - Performed by helper: Don/doff right sock, Don/doff left sock Shoes - Performed by helper: Don/doff right shoe, Don/doff left shoe, Fasten right, Fasten left Assist for footwear: Dependant Assist for lower body dressing: Touching or steadying assistance (Pt > 75%)  Function - Toileting Toileting activity did not occur: No continent bowel/bladder event(No BM, using urinal) Toileting steps completed by helper: Adjust clothing prior to toileting, Performs perineal hygiene, Adjust clothing after toileting Assist level: Two helpers  Function - Air cabin crew transfer activity did not occur: N/A Toilet transfer assistive device: Grab bar Assist level to toilet: Moderate assist (Pt 50 - 74%/lift or lower) Assist level from toilet: Moderate assist (Pt 50 - 74%/lift or lower)  Function - Chair/bed transfer Chair/bed transfer method: Ambulatory Chair/bed transfer assist level: Touching or steadying assistance (Pt > 75%) Chair/bed transfer assistive device: Armrests Chair/bed transfer details: Verbal cues for technique, Manual facilitation for weight shifting  Function - Locomotion: Wheelchair Type: Manual Max wheelchair distance: 171ft  Assist Level: Moderate assistance (Pt 50 - 74%) Assist Level: Moderate assistance (Pt 50 - 74%) Wheel 150 feet activity did not occur: Safety/medical concerns Function - Locomotion: Ambulation Assistive device: No device Max distance: 150' Assist level: Touching or steadying assistance (Pt > 75%) Assist level: Touching or steadying assistance (Pt > 75%) Assist level:  Touching or steadying assistance (Pt > 75%) Assist level: Touching or steadying assistance (Pt > 75%)  Function - Comprehension Comprehension: Auditory Comprehension assist level: Understands basic 75 - 89% of the time/ requires cueing 10 - 24% of the time  Function - Expression Expression: Verbal Expression assist level: Expresses basic 75 - 89% of the time/requires cueing 10 - 24% of the time. Needs helper to occlude trach/needs to repeat words.  Function - Social Interaction Social Interaction assist level: Interacts appropriately 75 - 89% of the time - Needs redirection for appropriate language or to initiate interaction.  Function - Problem Solving Problem solving assist level: Solves basic 50 - 74% of the time/requires cueing 25 - 49% of the time  Function - Memory Memory assist level: Recognizes or recalls 50 - 74% of the time/requires cueing 25 - 49% of the time Patient normally able to recall (first 3 days only): Location of own room, That he or she is in a hospital  Medical Problem List and Plan:  1. Functional and cognitive deficits secondary to left MCA infarct    Continue CIR 2. DVT Prophylaxis/Anticoagulation: Pharmaceutical: Heparin.  3. Pain Management: tylenol prn  4. Mood: Appears depressed. Team to provide ego support. LCSW to follow for evaluation and support.  5. Neuropsych: This patient is not capable of making decisions on his own behalf.  6. Skin/Wound Care: routine pressure relief measures.  7. Fluids/Electrolytes/Nutrition: Monitor I/O.   8. Hypotension: Will continue to monitor BP. Continue midodrine and florinef for BP support.    Blood pressure remained labile on 3/10   Last orthostatics positive, TED hose +/- abdominal binder as needed 9. COPD: Continue Breo. Albuterol nebs prn.  Left perihilar density is likely from his lung CA.   10. T2DM: Will monitor BS ac/hs. Continue glipizide and trajenta with SSI for elevated BS.  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  12. CKD   Creatinine 1.81 on 3/7   Cont to monitor 13. Acute blood loss anemia   Hemoglobin 10.6 on 3/6   Continue to monitor   LOS (Days) 5 A FACE TO FACE EVALUATION WAS PERFORMED  Edwin Jones 05/16/2017, 6:59 AM

## 2017-05-16 NOTE — Progress Notes (Signed)
Physical Therapy Session Note  Patient Details  Name: Edwin Jones MRN: [id number] Date of Birth: 1940-10-24  Today's Date: 05/16/2017 PT Individual Time: 1300-1340 PT Individual Time Calculation (min): 40 min  and Today's Date: 05/16/2017 PT Missed Time: 20 Minutes Missed Time Reason: Patient fatigue;Patient ill (Comment)  Short Term Goals: Week 1:  PT Short Term Goal 1 (Week 1): Pt will perform bed mobility without assist, but increased time.  PT Short Term Goal 2 (Week 1): Pt will ambulate 158ft with contact guard assist  PT Short Term Goal 3 (Week 1): Pt will propell WC 161ft with min assist  PT Short Term Goal 4 (Week 1): Pt will perform stand pivot transfers with supervision assist   Skilled Therapeutic Interventions/Progress Updates:    Pt supine in bed upon PT arrival, reports feeling fatigued and not well today, along with a fever. Pt's wife present and stating concerns that "something is wrong." Therapist calmed pt's wife and discussed pts current status. RN is aware of pt's changing status and has notified medical team, reported to continue with therapies. Pt transferred from supine>sitting EOB with min assist. Pt seated EOB used a washcloth to wash face and upper/lower body with increased time. Pt working on upright tolerance and seated balance to don shirt and pants, assist to loop LEs through pants. Pt performed sit<>stand with min assist, and pulled pants over hips. Pt performed sit<>stand again with min assist, attempting to work on walking however pt reports fatigue and pt also with increased work of breathing. Pt requesting to rest for the remainder of the session, missed 20 minutes of skilled therapy tx.   Therapy Documentation Precautions:  Precautions Precautions: Fall Precaution Comments: low blood pressure Restrictions Weight Bearing Restrictions: No   See Function Navigator for Current Functional Status.   Therapy/Group: Individual Therapy  Netta Corrigan,  PT, DPT 05/16/2017, 8:00 AM

## 2017-05-16 NOTE — Progress Notes (Signed)
Pt's Temperature 101.Dr Posey Pronto was informed.Orders for UA,UA culture,CX,blood cultured and stat CBC were received.Keep monitoring pt. Closely.

## 2017-05-16 NOTE — Progress Notes (Signed)
Refused HS snack. Incontinent of urine X 1 during night. Didn't call when incontinent. Few sips of water at HS. Right porta cath not accessed. Patient without complaint of pain.Edwin Jones A

## 2017-05-16 NOTE — Progress Notes (Signed)
Brief note:  Informed regarding fever, lethargy, AMS.  Workup initiated.

## 2017-05-17 ENCOUNTER — Inpatient Hospital Stay (HOSPITAL_COMMUNITY): Payer: Medicare Other | Admitting: Speech Pathology

## 2017-05-17 ENCOUNTER — Inpatient Hospital Stay (HOSPITAL_COMMUNITY): Payer: Medicare Other | Admitting: Physical Therapy

## 2017-05-17 ENCOUNTER — Inpatient Hospital Stay (HOSPITAL_COMMUNITY)
Admission: AD | Admit: 2017-05-17 | Discharge: 2017-05-20 | DRG: 064 | Disposition: A | Discharge status: Rehabilitation Facility | Payer: Medicare Other | Source: Other Acute Inpatient Hospital | Attending: Neurology | Admitting: Neurology

## 2017-05-17 ENCOUNTER — Inpatient Hospital Stay (HOSPITAL_COMMUNITY): Payer: Medicare Other

## 2017-05-17 ENCOUNTER — Inpatient Hospital Stay (HOSPITAL_COMMUNITY): Payer: Medicare Other | Admitting: Occupational Therapy

## 2017-05-17 DIAGNOSIS — I619 Nontraumatic intracerebral hemorrhage, unspecified: Secondary | ICD-10-CM | POA: Diagnosis not present

## 2017-05-17 DIAGNOSIS — I6389 Other cerebral infarction: Secondary | ICD-10-CM | POA: Diagnosis present

## 2017-05-17 DIAGNOSIS — I63232 Cerebral infarction due to unspecified occlusion or stenosis of left carotid arteries: Secondary | ICD-10-CM | POA: Diagnosis not present

## 2017-05-17 DIAGNOSIS — I639 Cerebral infarction, unspecified: Secondary | ICD-10-CM | POA: Diagnosis not present

## 2017-05-17 DIAGNOSIS — G8191 Hemiplegia, unspecified affecting right dominant side: Secondary | ICD-10-CM | POA: Diagnosis not present

## 2017-05-17 DIAGNOSIS — R29810 Facial weakness: Secondary | ICD-10-CM | POA: Diagnosis present

## 2017-05-17 DIAGNOSIS — R4701 Aphasia: Secondary | ICD-10-CM | POA: Diagnosis present

## 2017-05-17 DIAGNOSIS — Z7982 Long term (current) use of aspirin: Secondary | ICD-10-CM | POA: Diagnosis not present

## 2017-05-17 DIAGNOSIS — Z79899 Other long term (current) drug therapy: Secondary | ICD-10-CM | POA: Diagnosis not present

## 2017-05-17 DIAGNOSIS — E1122 Type 2 diabetes mellitus with diabetic chronic kidney disease: Secondary | ICD-10-CM | POA: Diagnosis present

## 2017-05-17 DIAGNOSIS — I63412 Cerebral infarction due to embolism of left middle cerebral artery: Principal | ICD-10-CM | POA: Diagnosis present

## 2017-05-17 DIAGNOSIS — K219 Gastro-esophageal reflux disease without esophagitis: Secondary | ICD-10-CM | POA: Diagnosis present

## 2017-05-17 DIAGNOSIS — G936 Cerebral edema: Secondary | ICD-10-CM | POA: Diagnosis present

## 2017-05-17 DIAGNOSIS — I129 Hypertensive chronic kidney disease with stage 1 through stage 4 chronic kidney disease, or unspecified chronic kidney disease: Secondary | ICD-10-CM | POA: Diagnosis present

## 2017-05-17 DIAGNOSIS — R0602 Shortness of breath: Secondary | ICD-10-CM | POA: Diagnosis not present

## 2017-05-17 DIAGNOSIS — Z8673 Personal history of transient ischemic attack (TIA), and cerebral infarction without residual deficits: Secondary | ICD-10-CM

## 2017-05-17 DIAGNOSIS — J189 Pneumonia, unspecified organism: Secondary | ICD-10-CM | POA: Diagnosis present

## 2017-05-17 DIAGNOSIS — I69320 Aphasia following cerebral infarction: Secondary | ICD-10-CM | POA: Diagnosis not present

## 2017-05-17 DIAGNOSIS — R4182 Altered mental status, unspecified: Secondary | ICD-10-CM | POA: Diagnosis not present

## 2017-05-17 DIAGNOSIS — Z794 Long term (current) use of insulin: Secondary | ICD-10-CM | POA: Diagnosis not present

## 2017-05-17 DIAGNOSIS — J440 Chronic obstructive pulmonary disease with acute lower respiratory infection: Secondary | ICD-10-CM | POA: Diagnosis present

## 2017-05-17 DIAGNOSIS — C3490 Malignant neoplasm of unspecified part of unspecified bronchus or lung: Secondary | ICD-10-CM | POA: Diagnosis present

## 2017-05-17 DIAGNOSIS — E7800 Pure hypercholesterolemia, unspecified: Secondary | ICD-10-CM | POA: Diagnosis present

## 2017-05-17 DIAGNOSIS — J181 Lobar pneumonia, unspecified organism: Secondary | ICD-10-CM | POA: Diagnosis not present

## 2017-05-17 DIAGNOSIS — I69322 Dysarthria following cerebral infarction: Secondary | ICD-10-CM | POA: Diagnosis not present

## 2017-05-17 DIAGNOSIS — Z881 Allergy status to other antibiotic agents status: Secondary | ICD-10-CM | POA: Diagnosis not present

## 2017-05-17 DIAGNOSIS — D631 Anemia in chronic kidney disease: Secondary | ICD-10-CM | POA: Diagnosis present

## 2017-05-17 DIAGNOSIS — Z7902 Long term (current) use of antithrombotics/antiplatelets: Secondary | ICD-10-CM | POA: Diagnosis not present

## 2017-05-17 DIAGNOSIS — N183 Chronic kidney disease, stage 3 (moderate): Secondary | ICD-10-CM | POA: Diagnosis present

## 2017-05-17 DIAGNOSIS — C3412 Malignant neoplasm of upper lobe, left bronchus or lung: Secondary | ICD-10-CM | POA: Diagnosis not present

## 2017-05-17 DIAGNOSIS — C3492 Malignant neoplasm of unspecified part of left bronchus or lung: Secondary | ICD-10-CM | POA: Diagnosis not present

## 2017-05-17 DIAGNOSIS — R1310 Dysphagia, unspecified: Secondary | ICD-10-CM | POA: Diagnosis present

## 2017-05-17 DIAGNOSIS — Z888 Allergy status to other drugs, medicaments and biological substances status: Secondary | ICD-10-CM | POA: Diagnosis not present

## 2017-05-17 DIAGNOSIS — N189 Chronic kidney disease, unspecified: Secondary | ICD-10-CM | POA: Diagnosis not present

## 2017-05-17 DIAGNOSIS — Z87891 Personal history of nicotine dependence: Secondary | ICD-10-CM

## 2017-05-17 DIAGNOSIS — I69351 Hemiplegia and hemiparesis following cerebral infarction affecting right dominant side: Secondary | ICD-10-CM | POA: Diagnosis not present

## 2017-05-17 DIAGNOSIS — D62 Acute posthemorrhagic anemia: Secondary | ICD-10-CM | POA: Diagnosis not present

## 2017-05-17 DIAGNOSIS — B370 Candidal stomatitis: Secondary | ICD-10-CM | POA: Diagnosis not present

## 2017-05-17 LAB — BASIC METABOLIC PANEL
Anion gap: 14 (ref 5–15)
BUN: 21 mg/dL — AB (ref 6–20)
CALCIUM: 9 mg/dL (ref 8.9–10.3)
CO2: 19 mmol/L — ABNORMAL LOW (ref 22–32)
CREATININE: 1.92 mg/dL — AB (ref 0.61–1.24)
Chloride: 102 mmol/L (ref 101–111)
GFR calc Af Amer: 37 mL/min — ABNORMAL LOW (ref >60)
GFR, EST NON AFRICAN AMERICAN: 32 mL/min — AB (ref >60)
Glucose, Bld: 140 mg/dL — ABNORMAL HIGH (ref 65–99)
POTASSIUM: 5.6 mmol/L — AB (ref 3.5–5.1)
SODIUM: 135 mmol/L (ref 135–145)

## 2017-05-17 LAB — CBC WITH DIFFERENTIAL/PLATELET
Basophils Absolute: 0 10*3/uL (ref 0.0–0.1)
Basophils Relative: 0 %
EOS ABS: 0.1 10*3/uL (ref 0.0–0.7)
EOS PCT: 2 %
HCT: 36.7 % — ABNORMAL LOW (ref 39.0–52.0)
Hemoglobin: 12 g/dL — ABNORMAL LOW (ref 13.0–17.0)
LYMPHS ABS: 1.4 10*3/uL (ref 0.7–4.0)
Lymphocytes Relative: 21 %
MCH: 31.3 pg (ref 26.0–34.0)
MCHC: 32.7 g/dL (ref 30.0–36.0)
MCV: 95.8 fL (ref 78.0–100.0)
MONO ABS: 0.7 10*3/uL (ref 0.1–1.0)
Monocytes Relative: 11 %
Neutro Abs: 4.6 10*3/uL (ref 1.7–7.7)
Neutrophils Relative %: 66 %
PLATELETS: 276 10*3/uL (ref 150–400)
RBC: 3.83 MIL/uL — AB (ref 4.22–5.81)
RDW: 13.3 % (ref 11.5–15.5)
WBC: 6.8 10*3/uL (ref 4.0–10.5)

## 2017-05-17 LAB — GLUCOSE, CAPILLARY
GLUCOSE-CAPILLARY: 136 mg/dL — AB (ref 65–99)
Glucose-Capillary: 134 mg/dL — ABNORMAL HIGH (ref 65–99)

## 2017-05-17 LAB — URINE CULTURE: Culture: <10000 — AB

## 2017-05-17 LAB — POTASSIUM: Potassium: 4.1 mmol/L (ref 3.5–5.1)

## 2017-05-17 MED ORDER — ACETAMINOPHEN 650 MG RE SUPP
650.0000 mg | RECTAL | Status: DC | PRN
Start: 2017-05-17 — End: 2017-05-20

## 2017-05-17 MED ORDER — LEVOFLOXACIN 500 MG PO TABS: 500.0000 mg | ORAL_TABLET | Freq: Every day | ORAL | Status: DC

## 2017-05-17 MED ORDER — INSULIN ASPART 100 UNIT/ML ~~LOC~~ SOLN: 0.0000 [IU] | Freq: Four times a day (QID) | SUBCUTANEOUS | Status: DC

## 2017-05-17 MED ORDER — LABETALOL HCL 5 MG/ML IV SOLN
10.0000 mg | INTRAVENOUS | Status: DC | PRN
  Administered 2017-05-17 (×2): 20 mg via INTRAVENOUS
  Administered 2017-05-17: 10 mg via INTRAVENOUS
  Administered 2017-05-17 (×5): 20 mg via INTRAVENOUS
  Administered 2017-05-17 – 2017-05-18 (×2): 10 mg via INTRAVENOUS
  Filled 2017-05-17 (×5): qty 4

## 2017-05-17 MED ORDER — FLUTICASONE FUROATE-VILANTEROL 200-25 MCG/INH IN AEPB
1.0000 | INHALATION_SPRAY | Freq: Every day | RESPIRATORY_TRACT | Status: DC
Start: 2017-05-18 — End: 2017-05-20
  Administered 2017-05-18 – 2017-05-20 (×2): 1 via RESPIRATORY_TRACT
  Filled 2017-05-17 (×2): qty 28

## 2017-05-17 MED ORDER — VITAMIN B-12 100 MCG PO TABS
100.0000 ug | ORAL_TABLET | Freq: Every day | ORAL | Status: DC
  Administered 2017-05-17: 100 ug via ORAL
  Filled 2017-05-17: qty 1

## 2017-05-17 MED ORDER — SODIUM CHLORIDE 0.9% FLUSH: 10.0000 mL | INTRAVENOUS | Status: DC | PRN

## 2017-05-17 MED ORDER — NICARDIPINE HCL IN NACL 20-0.86 MG/200ML-% IV SOLN
3.0000 mg/h | INTRAVENOUS | Status: DC
  Administered 2017-05-17 – 2017-05-18 (×2): 5 mg/h via INTRAVENOUS
  Administered 2017-05-18: 3 mg/h via INTRAVENOUS
  Filled 2017-05-17 (×2): qty 200

## 2017-05-17 MED ORDER — BISACODYL 10 MG RE SUPP
10.0000 mg | Freq: Every day | RECTAL | Status: DC | PRN
  Administered 2017-05-18: 10 mg via RECTAL
  Filled 2017-05-17: qty 1

## 2017-05-17 MED ORDER — NICARDIPINE HCL IN NACL 20-0.86 MG/200ML-% IV SOLN
INTRAVENOUS | Status: AC
  Filled 2017-05-17: qty 200

## 2017-05-17 MED ORDER — PROCHLORPERAZINE MALEATE 5 MG PO TABS
5.0000 mg | ORAL_TABLET | Freq: Four times a day (QID) | ORAL | Status: DC | PRN
  Filled 2017-05-17: qty 2

## 2017-05-17 MED ORDER — LEVOFLOXACIN IN D5W 500 MG/100ML IV SOLN: 500.0000 mg | INTRAVENOUS | Status: DC

## 2017-05-17 MED ORDER — FLEET ENEMA 7-19 GM/118ML RE ENEM: 1.0000 | ENEMA | Freq: Once | RECTAL | Status: DC | PRN

## 2017-05-17 MED ORDER — ONDANSETRON HCL 4 MG/2ML IJ SOLN: 4.0000 mg | Freq: Four times a day (QID) | INTRAMUSCULAR | Status: DC | PRN

## 2017-05-17 MED ORDER — ALBUTEROL SULFATE (2.5 MG/3ML) 0.083% IN NEBU: 3.0000 mL | INHALATION_SOLUTION | Freq: Four times a day (QID) | RESPIRATORY_TRACT | Status: DC | PRN

## 2017-05-17 MED ORDER — ACETAMINOPHEN 325 MG PO TABS: 650.0000 mg | ORAL_TABLET | ORAL | Status: DC | PRN

## 2017-05-17 MED ORDER — INSULIN ASPART 100 UNIT/ML ~~LOC~~ SOLN: 0.0000 [IU] | Freq: Every day | SUBCUTANEOUS | Status: DC

## 2017-05-17 MED ORDER — ADULT MULTIVITAMIN W/MINERALS CH
1.0000 | ORAL_TABLET | Freq: Every day | ORAL | Status: DC
  Administered 2017-05-17: 1 via ORAL
  Filled 2017-05-17: qty 1

## 2017-05-17 MED ORDER — SODIUM CHLORIDE 0.9 % IV SOLN
75.0000 mL/h | INTRAVENOUS | Status: DC
  Administered 2017-05-17: 75 mL/h via INTRAVENOUS

## 2017-05-17 MED ORDER — PANTOPRAZOLE SODIUM 40 MG IV SOLR
40.0000 mg | Freq: Every day | INTRAVENOUS | Status: DC
  Administered 2017-05-17: 40 mg via INTRAVENOUS
  Filled 2017-05-17: qty 40

## 2017-05-17 MED ORDER — LEVOFLOXACIN IN D5W 750 MG/150ML IV SOLN
750.0000 mg | Freq: Once | INTRAVENOUS | Status: DC
  Filled 2017-05-17: qty 150

## 2017-05-17 MED ORDER — STROKE: EARLY STAGES OF RECOVERY BOOK
Freq: Once | Status: AC
  Administered 2017-05-17: 15:00:00
  Filled 2017-05-17: qty 1

## 2017-05-17 MED ORDER — ORAL CARE MOUTH RINSE
15.0000 mL | Freq: Two times a day (BID) | OROMUCOSAL | Status: DC
  Administered 2017-05-17 (×2): 15 mL via OROMUCOSAL

## 2017-05-17 MED ORDER — ACETAMINOPHEN 160 MG/5ML PO SOLN: 650.0000 mg | ORAL | Status: DC | PRN

## 2017-05-17 MED ORDER — LEVOFLOXACIN 500 MG PO TABS
750.0000 mg | ORAL_TABLET | Freq: Once | ORAL | Status: AC
  Administered 2017-05-17: 750 mg via ORAL
  Filled 2017-05-17: qty 2

## 2017-05-17 MED ORDER — LEVOFLOXACIN IN D5W 500 MG/100ML IV SOLN
500.0000 mg | INTRAVENOUS | Status: DC
  Administered 2017-05-18: 500 mg via INTRAVENOUS
  Filled 2017-05-17: qty 100

## 2017-05-17 MED ORDER — PROCHLORPERAZINE 25 MG RE SUPP
12.5000 mg | Freq: Four times a day (QID) | RECTAL | Status: DC | PRN
Start: 2017-05-17 — End: 2017-05-17
  Filled 2017-05-17: qty 1

## 2017-05-17 MED ORDER — SODIUM CHLORIDE 0.9 % IV SOLN
75.0000 mL/h | INTRAVENOUS | Status: DC
  Administered 2017-05-17 – 2017-05-18 (×2): 75 mL/h via INTRAVENOUS

## 2017-05-17 MED ORDER — POLYETHYLENE GLYCOL 3350 17 G PO PACK
17.0000 g | PACK | Freq: Every day | ORAL | Status: DC
  Administered 2017-05-17: 17 g via ORAL
  Filled 2017-05-17: qty 1

## 2017-05-17 MED ORDER — PROCHLORPERAZINE EDISYLATE 5 MG/ML IJ SOLN: 5.0000 mg | Freq: Four times a day (QID) | INTRAMUSCULAR | Status: DC | PRN

## 2017-05-17 MED ORDER — SENNOSIDES-DOCUSATE SODIUM 8.6-50 MG PO TABS: 1.0000 | ORAL_TABLET | Freq: Two times a day (BID) | ORAL | Status: DC

## 2017-05-17 NOTE — Progress Notes (Signed)
Pt transferring to 4N29 due to active bleed in brain. Report called to RN on 4N. Pt change in status started over weekend with workup complete. Pt continues to be lethargic and was non verbal upon assessment this  morning. Wife notified by Algis Liming, PA with pt. Status change and POC.

## 2017-05-17 NOTE — Progress Notes (Signed)
Patient essentially non-verbal--he was able to state his name but was oriented to self only. Processing is very delayed but he was able to follow simple motor commands. EOMI. Right facial weakness appears to be at baseline. Question apraxia LUE/LLE and requiring max assist with mobility per therapy.  Discussed with MD. Will order CT head to rule out acute changes. Neurologic changes felt to be related to infection and patient started on Levaquin today. BC pending. Repeat labs pending. Will start IVF as intake poor due to fluctuating of  mentation.

## 2017-05-17 NOTE — H&P (Signed)
Stroke Neurology Admission Note  The history was obtained from the CIR PA.  During history and examination, all items was not able to obtain due to pt language difficulty.  History of Present Illness:  Edwin Jones is a 77 y.o. 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 No tPA given due to concern of brain metastasis. CTA showed a L ICA and L M1 occlusion. He was taken to IR where he had revascularization of the L ICA occlusion w/ angioplasty and L ICA stent, and mechanical thrombectomy of L M2 occlusion. Extubated post procedure. Follow up MRI/MRA brain showed restored patency of L-ICA and L-MCA, mild cytotoxic edema and left basal ganglia infarct, small infarct in left amygdala and small number of scatterd tiny cortical infarcts in L-MCA territory. He had problems with hypotension after ICA stenting was placed on Neo-Synephrine drip as well as albumin and stress dose steroids. He was put on oral midodrine and florinef for BP support. Discussed with his oncologist and felt stroke was not related to hypercoagulable state but likely cardioembolic source therefore TEE done on 05/10/17 and showed dilated left atrium but negative for thrombus, NO PFO/ASD and no source of cardiac or aortic emboli. He was started on ASA/Plavix for embolic stroke and loop recorder recommended to rule out A fib. Blood sugars have been poorly controlled and low dose Lantus recommended for tighter control. Patient with resultant left sided weakness with shuffling gait with need for rest breaks, cognitive deficits and difficulty with ADL tasks. CIR recommended and pt admitted to CIR on 05/11/17.   Pt has been doing well with rehab for the last week, weakness gradually improved and able to walk with PT 450 feet with minimal assist on 05/15/08. However, yesterday, he did not feel  well, with fever 101, CXR concerning for infection and he was put on levaquin. Today pt no fever but very lethargic with AMS, essentially non verbal, but able to tell his name. Pt completely off his baseline for the last week, which promoted CT stat which showed hemorrhagic conversion of left BG infarct with 72m midline shift. Neurology called and felt pt needs to transfer back to inpt service with ICU monitoring for hemorrhagic conversion. BP at 150s, stopped midodrine and florinef and gave labetalol PRN with goal < 140. Need to repeat CT head in am.   LSN: yesterday morning tPA Given: No: ICH  Past Medical History:  Diagnosis Date  . [place] ([place]   . [place] obstructive pulmonary disease) ([place]   . Diabetes mellitus without complication ([place]   . GERD without esophagitis   . Hepatitis C virus    history of hep C treated with Harvoni  . Hypercholesterolemia   . Hypertension   . Seasonal allergies     Past Surgical History:  Procedure Laterality Date  . COLONOSCOPY     approximately 11 years ago  . FLEXIBLE BRONCHOSCOPY N/A 11/05/2016   Procedure: FLEXIBLE BRONCHOSCOPY;  Surgeon: SWilhelmina Mcardle MD;  Location: [facility] ORS;  Service: Pulmonary;  Laterality: N/A;  . IR FLUORO GUIDE CV LINE RIGHT  05/06/2017  . IR INTRAVSC STENT CERV CAROTID W/O EMB-PROT MOD SED INC ANGIO  05/06/2017  . IR PERCUTANEOUS ART THROMBECTOMY/INFUSION INTRACRANIAL INC DIAG ANGIO  05/06/2017  . IR [place] ACCESS LEFT  05/06/2017  . IR [place] V[place] ACCESS RIGHT  05/06/2017  . IR US GUIDE VASC ACCESS RIGHT  05/06/2017  . PORTA CATH INSERTION N/A 11/23/2016   Procedure: Glori Luis Cath Insertion;  Surgeon: Algernon Huxley, MD;  Location: [facility];  Service: Cardiovascular;  Laterality: N/A;  . RADIOLOGY WITH ANESTHESIA N/A 05/06/2017   Procedure: IR WITH ANESTHESIA;  Surgeon: Radiologist, Medication, MD;  Location: [facility];  Service: Radiology;  Laterality: N/A;  . TEE WITHOUT CARDIOVERSION N/A 05/10/2017    Procedure: TRANSESOPHAGEAL ECHOCARDIOGRAM (TEE);  Surgeon: Sanda Klein, MD;  Location: [facility] ENDOSCOPY;  Service: Cardiovascular;  Laterality: N/A;  . TONSILLECTOMY      Family History  Problem Relation Age of Onset  . Skin cancer Mother   . Ovarian cancer Sister        sister was diagnosed as a teenager  . Throat cancer Brother 67       brother #1  . Lung cancer Sister 65  . Throat cancer Brother 61       brother #2  . Skin cancer Sister     Social History:  reports that he quit smoking about 13 years ago. His smoking use included cigarettes. He has a 50.00 pack-year smoking history. he has never used smokeless tobacco. He reports that he does not drink alcohol or use drugs.  Allergies:  Allergies  Allergen Reactions  . Penicillin G Anaphylaxis    Other reaction(s): Other (See Comments) Couldn't breath among other things Has patient had a PCN reaction causing immediate rash, facial/tongue/throat swelling, SOB or lightheadedness with hypotension: Yes Has patient had a PCN reaction causing severe rash involving mucus membranes or skin necrosis: No Has patient had a PCN reaction that required hospitalization: Yes Has patient had a PCN reaction occurring within the last 10 years: No If all of the above answers are "NO", then may proceed with Ce  . Shellfish-Derived Products Anaphylaxis  . Statins     Other reaction(s): Other (See Comments) Muscle spasms - can't walk - couldn't turn over in bed  . Erythromycin Itching    All mycins  . Tamsulosin     Other reaction(s): Dizziness  . Tuberculin Ppd     Other reaction(s): Other (See Comments) False test   . Iodinated Diagnostic Agents Hives    No current facility-administered medications on file prior to encounter.    Current Outpatient Medications on File Prior to Encounter  Medication Sig Dispense Refill  . acyclovir ointment (ZOVIRAX) 5 % Apply topically 3 (three) times daily.    . Chlorhexidine Gluconate Cloth 2 % PADS  Apply 6 each topically daily.    . clopidogrel (PLAVIX) 75 MG tablet Take 1 tablet (75 mg total) by mouth daily.    . fludrocortisone (FLORINEF) 0.1 MG tablet Take 1 tablet (0.1 mg total) by mouth daily.    . heparin 5000 UNIT/ML injection Inject 1 mL (5,000 Units total) into the skin every 8 (eight) hours. 1 mL   . insulin aspart (NOVOLOG) 100 UNIT/ML injection Inject 0-15 Units into the skin 3 (three) times daily with meals. 10 mL 11  . insulin aspart (NOVOLOG) 100 UNIT/ML injection Inject 4 Units into the skin 3 (three) times daily with meals. 10 mL 11  . insulin aspart (NOVOLOG) 100 UNIT/ML injection Inject 0-5 Units into the skin at bedtime. 10 mL 11  . midodrine (PROAMATINE) 5 MG tablet Take 1 tablet (5 mg total) by mouth 3 (three) times daily with meals.    . nystatin (MYCOSTATIN) 100000 UNIT/ML suspension Use as  directed 5 mLs (500,000 Units total) in the mouth or throat 4 (four) times daily. 60 mL 0  . ondansetron (ZOFRAN) 4 MG/2ML SOLN injection Inject 2 mLs (4 mg total) into the vein every 6 (six) hours as needed for nausea or vomiting. 2 mL 0  . senna-docusate (SENOKOT-S) 8.6-50 MG tablet Take 1 tablet by mouth at bedtime as needed for mild constipation.      Review of Systems: A full ROS was attempted today and was not able to be performed.  Systems assessed include - Constitutional, Eyes, HENT, Respiratory, Cardiovascular, Gastrointestinal, Genitourinary, Integument/breast, Hematologic/lymphatic, Musculoskeletal, Neurological, Behavioral/Psych, Endocrine, Allergic/Immunologic - with pertinent responses as per HPI.  Physical Examination: Temp:  [98.6 F (37 C)-100.4 F (38 C)] 98.8 F (37.1 C) (03/11 1300) Pulse Rate:  [67-77] 67 (03/11 1300) Resp:  [16-18] 18 (03/11 1300) BP: (108-152)/(58-81) 152/73 (03/11 1300) SpO2:  [97 %-100 %] 97 % (03/11 1500) Weight:  [207 lb 3.7 oz (94 kg)] 207 lb 3.7 oz (94 kg) (03/11 1500)  General - well nourished, well developed, in no apparent  distress.    Ophthalmologic - fundi not visualized due to noncooperation.    Cardiovascular - regular rate and rhythm  Neuro -  Eyes open, awake, lethargic, only able to tell me his last name and then perseverated on that. Very limited sound output. Does not following commands but able to pantomime gestures. Anomia, but able to repeat simple sentence. Severe dysarthria. PERRL, EOMI, blinking to visual threat bilaterally, right facial droop, tongue midline in mouth. BUE 4/5 proximal and 3/5 distally, BLE 3/5 proximal and 3+/5 distally. DTR 1+ and no babinski. Sensation, coordination and gait not tested.   Data Reviewed: I have personally reviewed the radiological images below and agree with the radiology interpretations.  Dg Chest 2 View  Result Date: 05/16/2017 CLINICAL DATA:  Cough, shortness of breath, chest pain for 2 days. EXAM: CHEST - 2 VIEW COMPARISON:  Chest x-rays dated 05/11/2017, 05/06/2017 and 03/18/2017. FINDINGS: Heart size and mediastinal contours are within normal limits. Right chest wall Port-A-Cath is stable in position with tip at the level of the mid/upper SVC. The left perihilar opacity is unchanged in the short-term interval. Right lung remains clear. No new lung findings. No pleural effusion or pneumothorax seen. IMPRESSION: 1. Ill-defined opacity within the left perihilar lung is unchanged in the short-term interval, at least mildly improved compared to the earlier chest x-ray of 05/06/2017. Earlier CT of 03/24/2017 showed an associated pulmonary mass in this area. As indicated on the earlier chest x-ray report, findings could represent associated atelectasis, postobstructive pneumonitis or local tumor spread. 2. No new lung findings. Electronically Signed   By: Franki Cabot M.D.   On: 05/16/2017 15:15   Ct Head Wo Contrast  Addendum Date: 05/17/2017   ADDENDUM REPORT: 05/17/2017 13:48 ADDENDUM: Critical Value/emergent results were called by telephone at the time of  interpretation on 05/17/2017 at 1327 hours to Dr. Reesa Chew , who verbally acknowledged these results. We discussed that the intracranial hemorrhage appears acute, but occurs in the area of left basal ganglia infarction seen on 05/06/2017. If related, this would be a Heidelberg type 2 Bleed given the mass effect. Although a period of 10-11 days seems atypical for hemorrhagic conversion or post therapy bleeding after cerebral infarct. The patient is reportedly still on Plavix and heparin. I recommended she discuss with Neurology. Electronically Signed   By: Genevie Ann M.D.   On: 05/17/2017 13:48   Result Date: 05/17/2017  CLINICAL DATA:  77 year old male status post left ICA and MCA emergent large vessel occlusion on 05/06/2017 status post endovascular treatment. Worsening speech and weakness today. Initial encounter. EXAM: CT HEAD WITHOUT CONTRAST TECHNIQUE: Contiguous axial images were obtained from the base of the skull through the vertex without intravenous contrast. COMPARISON:  Post treatment brain MRI 05/06/2017 and earlier. FINDINGS: Brain: Lobulated hyperdense hemorrhage centered at the left basal ganglia area of infarct in February encompasses 43 x 27 x 32 millimeters (AP by transverse by CC) for an estimated intra-axial blood volume of 19 milliliters. There is surrounding edema. There is regional mass effect including rightward midline shift of 5 millimeters. Mass effect on the left lateral ventricle with no intraventricular or extra-axial extension of hemorrhage. Cerebral edema is superimposed on chronic bilateral white matter hypodensity. Edema tracks into the left thalamus and midbrain. The basilar cisterns remain normal. Below the midbrain the posterior fossa gray-white matter differentiation remains normal. No superimposed cytotoxic edema identified in the brain. Vascular: Calcified atherosclerosis at the skull base. Skull: No acute osseous abnormality identified. Sinuses/Orbits: Clear. Other: Leftward  gaze deviation. Visualized scalp soft tissues are within normal limits. IMPRESSION: 1. Acute hemorrhage in the left basal ganglia. Estimated blood volume 19 mL. Surrounding edema, tracking through the left thalamus to the midbrain. Regional mass effect including partial effacement of the left lateral ventricle and rightward midline shift of 5 millimeters. 2. No intraventricular or extra-axial hemorrhage extension. No ventriculomegaly. 3. Underlying chronic small vessel disease. Electronically Signed: By: Genevie Ann M.D. On: 05/17/2017 13:19    Assessment: 77 y.o. 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 MRI/MRA brain showed restored patency of L-ICA and L-MCA, mild cytotoxic edema and left basal ganglia infarct, small infarct in left amygdala and small number of scatterd tiny cortical infarcts in L-MCA territory. He had problems with hypotension after ICA stenting was put on oral midodrine and florinef for BP support. TEE  Unremarkable and loop recorder recommended to rule out A fib. Pt admitted to CIR on 05/11/17 and has been doing well with rehab for the last week. However, had fever 101 yesterday, put on levaquin. Today pt lethargic with AMS, essentially non verbal, CT stat which showed hemorrhagic conversion of left BG infarct with 63m midline shift.   Plan: - admit to neuro ICU for close monitoring - BP goal < 140 - labetalol PRN, with cleviprex if needed - hold off all po meds - NPO for now, on IVF - repeat potassium this pm - CBC/BMP in am - CT and CXR repeat in am - Telemetry monitoring - Frequent neuro checks and vital signs - PT/OT/speech - SSI and CBG monitoring  This patient is critically ill due to left BG hemorrhagic conversion, HTN, fever with pneumonia in the  setting of lung cancer and at significant risk of neurological worsening, death form hematoma expansion, cerebral edema and brain herniation. This patient's care requires constant monitoring of vital signs, hemodynamics, respiratory and cardiac monitoring, review of multiple databases, neurological assessment, discussion with family, other specialists and medical decision making of high complexity. I spent 40 minutes of neurocritical care time in the care of this patient.  JRosalin Hawking MD PhD Stroke Neurology 05/17/2017 3:34 PM

## 2017-05-17 NOTE — Progress Notes (Signed)
Paged Dr. Posey Pronto at 0400 R/T ongoing fever and CXR results. Orders to start levaquin, pharmacy dosed. Informed patient's wife about adding antibiotic. Incontinent of urine during night. Attempted to toilet. Wife concerned R/T incontinence. Patient withdrawn, with flat affect. Doesn't use call light when wet. Congested NP cough. [name] A

## 2017-05-17 NOTE — Discharge Summary (Signed)
Physician Discharge Summary  Patient ID: Edwin Jones MRN: [id number] DOB/AGE: 78-Apr-1942 78 y.o.  Admit date: 05/11/2017 Discharge date: 05/17/2017  Discharge Diagnoses:  Principal Problem:   Nontraumatic acute hemorrhage of basal ganglia (HCC) Active Problems:   Idiopathic hypotension   Infarction of left basal ganglia (HCC)   Acute blood loss anemia   Diabetes mellitus type 2 in nonobese (HCC)   Labile blood glucose   Orthostatic hypotension   Cough   Fever, unspecified   Discharged Condition: Guarded.   Significant Diagnostic Studies:  Dg Chest 2 View  Result Date: 05/16/2017 CLINICAL DATA:  Cough, shortness of breath, chest pain for 2 days. EXAM: CHEST - 2 VIEW COMPARISON:  Chest x-rays dated 05/11/2017, 05/06/2017 and 03/18/2017. FINDINGS: Heart size and mediastinal contours are within normal limits. Right chest wall Port-A-Cath is stable in position with tip at the level of the mid/upper SVC. The left perihilar opacity is unchanged in the short-term interval. Right lung remains clear. No new lung findings. No pleural effusion or pneumothorax seen. IMPRESSION: 1. Ill-defined opacity within the left perihilar lung is unchanged in the short-term interval, at least mildly improved compared to the earlier chest x-ray of 05/06/2017. Earlier CT of 03/24/2017 showed an associated pulmonary mass in this area. As indicated on the earlier chest x-ray report, findings could represent associated atelectasis, postobstructive pneumonitis or local tumor spread. 2. No new lung findings. Electronically Signed   By: Franki Cabot M.D.   On: 05/16/2017 15:15    Ct Head Wo Contrast  Addendum Date: 05/17/2017   ADDENDUM REPORT: 05/17/2017 13:48 ADDENDUM: Critical Value/emergent results were called by telephone at the time of interpretation on 05/17/2017 at 1327 hours to Dr. Reesa Chew , who verbally acknowledged these results. We discussed that the intracranial hemorrhage appears acute, but occurs in  the area of left basal ganglia infarction seen on 05/06/2017. If related, this would be a Heidelberg type 2 Bleed given the mass effect. Although a period of 10-11 days seems atypical for hemorrhagic conversion or post therapy bleeding after cerebral infarct. The patient is reportedly still on Plavix and heparin. I recommended she discuss with Neurology. Electronically Signed   By: Genevie Ann M.D.   On: 05/17/2017 13:48   Result Date: 05/17/2017 CLINICAL DATA:  77 year old male status post left ICA and MCA emergent large vessel occlusion on 05/06/2017 status post endovascular treatment. Worsening speech and weakness today. Initial encounter. EXAM: CT HEAD WITHOUT CONTRAST TECHNIQUE: Contiguous axial images were obtained from the base of the skull through the vertex without intravenous contrast. COMPARISON:  Post treatment brain MRI 05/06/2017 and earlier. FINDINGS: Brain: Lobulated hyperdense hemorrhage centered at the left basal ganglia area of infarct in February encompasses 43 x 27 x 32 millimeters (AP by transverse by CC) for an estimated intra-axial blood volume of 19 milliliters. There is surrounding edema. There is regional mass effect including rightward midline shift of 5 millimeters. Mass effect on the left lateral ventricle with no intraventricular or extra-axial extension of hemorrhage. Cerebral edema is superimposed on chronic bilateral white matter hypodensity. Edema tracks into the left thalamus and midbrain. The basilar cisterns remain normal. Below the midbrain the posterior fossa gray-white matter differentiation remains normal. No superimposed cytotoxic edema identified in the brain. Vascular: Calcified atherosclerosis at the skull base. Skull: No acute osseous abnormality identified. Sinuses/Orbits: Clear. Other: Leftward gaze deviation. Visualized scalp soft tissues are within normal limits. IMPRESSION: 1. Acute hemorrhage in the left basal ganglia. Estimated blood volume 19 mL. Surrounding  edema, tracking through the left thalamus to the midbrain. Regional mass effect including partial effacement of the left lateral ventricle and rightward midline shift of 5 millimeters. 2. No intraventricular or extra-axial hemorrhage extension. No ventriculomegaly. 3. Underlying chronic small vessel disease. Electronically Signed: By: Genevie Ann M.D. On: 05/17/2017 13:19    Dg Chest Port 1 View  Result Date: 05/08/2017 CLINICAL DATA:  Abnormal chest x-ray. EXAM: PORTABLE CHEST 1 VIEW COMPARISON:  Radiograph of May 06, 2017. FINDINGS: Stable cardiomegaly. Right internal jugular Port-A-Cath is unchanged in position. No pneumothorax is noted. Mild right midlung subsegmental atelectasis is noted. 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 Bony thorax is unremarkable. IMPRESSION: Stable left perihilar and basilar opacities are noted as described above. Mild right midlung subsegmental atelectasis. Electronically Signed   By: Marijo Conception, M.D.   On: 05/08/2017 15:38     Labs:  Basic Metabolic Panel: Recent Labs  Lab 05/11/17 0227 05/12/17 0753 05/13/17 0653  NA 139 139 139  K 4.2 4.1 4.4  CL 111 107 108  CO2 18* 21* 21*  GLUCOSE 155* 219* 145*  BUN 27* 20 20  CREATININE 1.76* 1.80* 1.81*  CALCIUM 8.3* 8.5* 8.7*    CBC: Recent Labs  Lab 05/12/17 0753 05/16/17 1339 05/17/17 1257  WBC 5.9 6.4 6.8  NEUTROABS 4.3 5.0 4.6  HGB 10.6* 11.3* 12.0*  HCT 33.1* 34.9* 36.7*  MCV 95.9 95.1 95.8  PLT 201 292 276    CBG: Recent Labs  Lab 05/16/17 1135 05/16/17 1648 05/16/17 2102 05/17/17 0632 05/17/17 1151  GLUCAP 151* 85 85 134* 136*    Brief HPI:   Sloane Junkin is an 77 y.o. 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 He did not receive  TPA secondary to concerns of potential brain metastasis. CTA brain done revealing demonstrating left M1 occlusion and patient underwent mechanical thrombectomy and ICA stenting. Follow up MRI/MRA brain showed restored patency of L-ICA and L-MCA, mild cytotoxic edema and left basal ganglia infarct, small infarct in left amygdala and small number of scatterd tiny cortical infarcts in L-MCA territory.   He had problems with hypotension after ICA stenting was placed on Neo-Synephrine drip, albumin and stress dose steroids. He was transitioned to oral midodrine and florinef for BP support.  Dr. Erlinda Hong and Dr. Rogue Bussing felt that stroke was not related to hypercoagulable state but likely cardioembolic source therefore TEE done 3/4 and showed dilated left atrium but  for thrombus.  He was started on ASA/Plavix for embolic stroke and loop recorder recommended to rule out A fib. mended for tighter control. Patient with resultant left sided weakness with shuffling gait with need for rest breaks, cognitive deficits and difficulty with ADL tasks. CIR recommended due to functional deficits.    Hospital Course: Anzel Kearse was admitted to rehab 05/11/2017 for inpatient therapies to consist of PT, ST and OT at least three hours five days a week. Past admission physiatrist, therapy team and rehab RN have worked together to provide customized collaborative inpatient rehab. He was noted to have intermittent bouts of lethargy at admission felt to be due to sedation from procedure before. CXR was done for follow up and showed left perihilar density likely from lung cancer. Mentation improved and IVF were discontinued on  03/6.  He was maintained on florinef and midodrine due to orthostatic hypotension. Follow up labs showed ABLA to be resolving and platelets were improving. Pre renal azotemia has improved with rise int SCr to 1.9.  Blood sugars have been monitored on ac/hs basis and have been well controlled.   He did develop  Fever  of 101 on 3/10 with lethargy and was pan cultured. Blood cultures and UA are negative so far.  CXR was unchanged but he was started on Levaquin due to concerns of post obstructive pneumonitis. On 3/11, he was noted to have declined neurologically and required mod to max assist to stand and to transfer. He was essentially non verbal and oriented to self only. He was also noted to be incontinent of bladder. Labs were ordered for work up as well as CT head. CT revealed acute left basal ganglia hemorrhage in area of prior infarct with surrounding edema, regional mass effect and 5 mm rightward midline shift. Case was discussed with Dr. Nevada Crane and Dr Erlinda Hong. ASA/Plavix was discontinued and patient was transferred to ICU for closer monitoring per Neurology input.    Medications at discharge:  1.  Florinef 0.1 mg daily 2.  Breo Ellipta 200-25 one puff daily. 3. Glucotrol XL 10 mg po daily 4. Novolog 4 units tid SQ ac 5. Levaquin 500 mg po daily. 6. Tradjenta 5 mg po daily 7. Proamatine 2.5 mg po tid. 8. Multivitamin po daily 9. Miralax 17 g in 8 ounces daily. 10. Vitamin B-12 one tab po daily. 11. Normal saline 0.9% at 75 cc/hr. 12. Nystatin mouthwash swish and swallow qid prn.    Diet: Heart healthy/Carb modified. Needs supervision with meals.   Disposition: Acute hospital.     Signed: Bary Leriche 05/17/2017, 2:05 PM

## 2017-05-17 NOTE — Progress Notes (Signed)
Physical Therapy Note  Patient Details  Name: Edwin Jones MRN: [id number] Date of Birth: [birth year] Today's Date: 05/17/2017    Attempted to see pt for makeup time from a previous date. Per RN pt not cleared for therapy at this time. will f/u per POC.   Waunita Schooner 05/17/2017, 1:42 PM

## 2017-05-17 NOTE — Progress Notes (Addendum)
Speech Language Pathology Daily Session Note  Patient Details  Name: Edwin Jones MRN: [id number] Date of Birth: 01-21-1941  Today's Date: 05/17/2017 SLP Individual Time: 1000-1040 SLP Individual Time Calculation (min): 40 min  Short Term Goals: Week 1: SLP Short Term Goal 1 (Week 1): Pt will utilize word finding strategies toname common objects with 95% accuracy and Min A cues.  SLP Short Term Goal 2 (Week 1): Pt will self-monitor and self-correct verbal responses with Mod A cues.  SLP Short Term Goal 3 (Week 1): Pt will demonstrate selective attention in moderately distracting environment for ~30 minutes with supervision cues.  SLP Short Term Goal 4 (Week 1): Pt will follow 2 step directions iwth 75% accuracy and Mod A cues.  SLP Short Term Goal 5 (Week 1): Pt will utilize speech intelligibility strategies (increased vocal intensity) to achieve ~ 50% intelligibility at the phrase level with Mod A cues.   Skilled Therapeutic Interventions: Skilled treatment session focused on cognitive-linguistic goals. Upon arrival, patient was awake while supine in bed. Patient had been incontinent and had soaked the bed. SLP facilitated session by providing extra time and Max A verbal and tactile cues for patient to sit EOB and to perform basic self-care tasks (taking off shirt). Patient also required Mod-Max A to stand and transfer to a chair in order to change his sheets. Patient was essentially nonverbal throughout session and utilized gestures to answer basic yes/no questions in regards to wants/needs and biographical information with ~25% accuracy. RN and PA made aware of cognitive-linguistic and physical changes. Vitals taken and WFL. Patient transferred back to bed and missed remaining 20 minutes of session. Patient left supine with alarm on and all needs within reach. Continue with current plan of care.      Function:  Cognition Comprehension Comprehension assist level: Understands basic 25 - 49%  of the time/ requires cueing 50 - 75% of the time  Expression   Expression assist level: Expresses basic 25 - 49% of the time/requires cueing 50 - 75% of the time. Uses single words/gestures.  Social Interaction Social Interaction assist level: Interacts appropriately 25 - 49% of time - Needs frequent redirection.  Problem Solving Problem solving assist level: Solves basic 25 - 49% of the time - needs direction more than half the time to initiate, plan or complete simple activities  Memory Memory assist level: Recognizes or recalls 25 - 49% of the time/requires cueing 50 - 75% of the time    Pain Pain Assessment Pain Assessment: No/denies pain  Therapy/Group: Individual Therapy  Keah Lamba 05/17/2017, 12:38 PM

## 2017-05-17 NOTE — Progress Notes (Signed)
Physical Therapy Note  Patient Details  Name: Jerick Khachatryan MRN: [id number] Date of Birth: [birth year] Today's Date: 05/17/2017    Attempted to see pt for scheduled PT session.  Pt extremely lethargic, not maintaining eyes open or speaking.  Pt would only nod head yes/no with questionable accuracy.  Per discussion with SLP, pt requiring significantly more assist for functional mobility compared to when this PT saw pt on Friday.  Alerted Pam, PA.  Missed 60 minutes skilled PT.    Michel Santee 05/17/2017, 12:25 PM

## 2017-05-17 NOTE — Progress Notes (Signed)
Social Work  Discharge Note  The overall goal for the admission was met for:   Discharge location: No-transferring to acute unit-medical issue  Length of Stay: No-7 days  Discharge activity level: No  Home/community participation: No  Services provided included: MD, RD, PT, OT, SLP, RN, CM, Pharmacy and SW  Financial Services: Medicare  Follow-up services arranged: Other: transferring to acute medical issues  Comments (or additional information):  Patient/Family verbalized understanding of follow-up arrangements: No  Individual responsible for coordination of the follow-up plan: wife  Confirmed correct DME delivered: Elease Hashimoto 05/17/2017    Elease Hashimoto

## 2017-05-17 NOTE — Progress Notes (Signed)
Dr.Aroor notified of patient's SBP consistently staying >140 despite multiple doses of IV Labetalol being given. Order for Cardene obtained.

## 2017-05-17 NOTE — Progress Notes (Signed)
Subjective/Complaints: Patient with occasional cough.  Appears alert aphasic  Review of systems: Denies CP, SOB, nausea, vomiting, diarrhea.  Objective: Vital Signs: Blood pressure (!) 152/81, pulse 76, temperature 99.7 F (37.6 C), temperature source Oral, resp. rate 16, weight 95 kg (209 lb 7 oz), SpO2 98 %. Dg Chest 2 View  Result Date: 05/16/2017 CLINICAL DATA:  Cough, shortness of breath, chest pain for 2 days. EXAM: CHEST - 2 VIEW COMPARISON:  Chest x-rays dated 05/11/2017, 05/06/2017 and 03/18/2017. FINDINGS: Heart size and mediastinal contours are within normal limits. Right chest wall Port-A-Cath is stable in position with tip at the level of the mid/upper SVC. The left perihilar opacity is unchanged in the short-term interval. Right lung remains clear. No new lung findings. No pleural effusion or pneumothorax seen. IMPRESSION: 1. Ill-defined opacity within the left perihilar lung is unchanged in the short-term interval, at least mildly improved compared to the earlier chest x-ray of 05/06/2017. Earlier CT of 03/24/2017 showed an associated pulmonary mass in this area. As indicated on the earlier chest x-ray report, findings could represent associated atelectasis, postobstructive pneumonitis or local tumor spread. 2. No new lung findings. Electronically Signed   By: Franki Cabot M.D.   On: 05/16/2017 15:15   Results for orders placed or performed during the hospital encounter of 05/11/17 (from the past 72 hour(s))  Glucose, capillary     Status: Abnormal   Collection Time: 05/14/17 11:54 AM  Result Value Ref Range   Glucose-Capillary 145 (H) 65 - 99 mg/dL  Glucose, capillary     Status: None   Collection Time: 05/14/17  4:29 PM  Result Value Ref Range   Glucose-Capillary 73 65 - 99 mg/dL  Glucose, capillary     Status: Abnormal   Collection Time: 05/14/17  8:42 PM  Result Value Ref Range   Glucose-Capillary 168 (H) 65 - 99 mg/dL   Comment 1 Notify RN   Glucose, capillary      Status: Abnormal   Collection Time: 05/15/17  6:34 AM  Result Value Ref Range   Glucose-Capillary 147 (H) 65 - 99 mg/dL   Comment 1 Notify RN   Glucose, capillary     Status: Abnormal   Collection Time: 05/15/17 12:03 PM  Result Value Ref Range   Glucose-Capillary 101 (H) 65 - 99 mg/dL  Glucose, capillary     Status: None   Collection Time: 05/15/17  5:01 PM  Result Value Ref Range   Glucose-Capillary 75 65 - 99 mg/dL  Glucose, capillary     Status: Abnormal   Collection Time: 05/15/17  9:03 PM  Result Value Ref Range   Glucose-Capillary 120 (H) 65 - 99 mg/dL   Comment 1 Notify RN   Glucose, capillary     Status: Abnormal   Collection Time: 05/16/17  6:37 AM  Result Value Ref Range   Glucose-Capillary 141 (H) 65 - 99 mg/dL   Comment 1 Notify RN   Glucose, capillary     Status: Abnormal   Collection Time: 05/16/17 11:35 AM  Result Value Ref Range   Glucose-Capillary 151 (H) 65 - 99 mg/dL  CBC with Differential/Platelet     Status: Abnormal   Collection Time: 05/16/17  1:39 PM  Result Value Ref Range   WBC 6.4 4.0 - 10.5 K/uL   RBC 3.67 (L) 4.22 - 5.81 MIL/uL   Hemoglobin 11.3 (L) 13.0 - 17.0 g/dL   HCT 34.9 (L) 39.0 - 52.0 %   MCV 95.1 78.0 - 100.0 fL  MCH 30.8 26.0 - 34.0 pg   MCHC 32.4 30.0 - 36.0 g/dL   RDW 12.9 11.5 - 15.5 %   Platelets 292 150 - 400 K/uL   Neutrophils Relative % 76 %   Neutro Abs 5.0 1.7 - 7.7 K/uL   Lymphocytes Relative 10 %   Lymphs Abs 0.7 0.7 - 4.0 K/uL   Monocytes Relative 10 %   Monocytes Absolute 0.6 0.1 - 1.0 K/uL   Eosinophils Relative 3 %   Eosinophils Absolute 0.2 0.0 - 0.7 K/uL   Basophils Relative 1 %   Basophils Absolute 0.0 0.0 - 0.1 K/uL    Comment: Performed at [facility] [address], [place], [place] [address]  Glucose, capillary     Status: None   Collection Time: 05/16/17  4:48 PM  Result Value Ref Range   Glucose-Capillary 85 65 - 99 mg/dL  Urinalysis, Routine w reflex microscopic     Status: None    Collection Time: 05/16/17  6:14 PM  Result Value Ref Range   Color, Urine YELLOW YELLOW   APPearance CLEAR CLEAR   Specific Gravity, Urine 1.020 1.005 - 1.030   pH 5.0 5.0 - 8.0   Glucose, UA NEGATIVE NEGATIVE mg/dL   Hgb urine dipstick NEGATIVE NEGATIVE   Bilirubin Urine NEGATIVE NEGATIVE   Ketones, ur NEGATIVE NEGATIVE mg/dL   Protein, ur NEGATIVE NEGATIVE mg/dL   Nitrite NEGATIVE NEGATIVE   Leukocytes, UA NEGATIVE NEGATIVE    Comment: Performed at [facility] [address] [place], [place] [address]  Glucose, capillary     Status: None   Collection Time: 05/16/17  9:02 PM  Result Value Ref Range   Glucose-Capillary 85 65 - 99 mg/dL   Comment 1 Notify RN   Glucose, capillary     Status: Abnormal   Collection Time: 05/17/17  6:32 AM  Result Value Ref Range   Glucose-Capillary 134 (H) 65 - 99 mg/dL   Comment 1 Notify RN      General: NAD. Vital signs reviewed. HEENT: Normocephalic, atraumatic. Cardio: RRR and No JVD. Resp: CTA B/L and unlabored GI: BS positive and nondistended Musc/Skel:  No tenderness. No edema. Neuro: Alert/Oriented Right facial weakness Motor 4+/5 RUE/RLE (stable) 5/5 in the left deltoid bicep tricep grip hip flexion knee extension ankle dorsiflexion Dysarthria Skin:   Intact. Warm and dry. Psych: Flat affect.   Assessment/Plan: 1. Functional deficits secondary to left MCA infarct with right hemiparesis which require 3+ hours per day of interdisciplinary therapy in a comprehensive inpatient rehab setting. Physiatrist is providing close team supervision and 24 hour management of active medical problems listed below. Physiatrist and rehab team continue to assess barriers to discharge/monitor patient progress toward functional and medical goals. FIM: Function - Bathing Position: Wheelchair/chair at sink Body parts bathed by patient: Right arm, Left arm, Chest, Abdomen, Front perineal area, Right upper leg, Left upper leg, Buttocks Body parts  bathed by helper: Right lower leg, Left lower leg, Back Assist Level: Touching or steadying assistance(Pt > 75%)  Function- Upper Body Dressing/Undressing What is the patient wearing?: Pull over shirt/dress Pull over shirt/dress - Perfomed by patient: Thread/unthread right sleeve, Thread/unthread left sleeve, Put head through opening Pull over shirt/dress - Perfomed by helper: Pull shirt over trunk Assist Level: Touching or steadying assistance(Pt > 75%) Function - Lower Body Dressing/Undressing What is the patient wearing?: Pants, Non-skid slipper socks Position: Wheelchair/chair at sink Pants- Performed by patient: Thread/unthread left pants leg, Pull pants up/down  Pants- Performed by helper: Thread/unthread right pants leg, Fasten/unfasten pants Non-skid slipper socks- Performed by helper: Don/doff left sock, Don/doff right sock Socks - Performed by helper: Don/doff right sock, Don/doff left sock Shoes - Performed by helper: Don/doff right shoe, Don/doff left shoe, Fasten right, Fasten left Assist for footwear: Dependant Assist for lower body dressing: Touching or steadying assistance (Pt > 75%)  Function - Toileting Toileting activity did not occur: No continent bowel/bladder event(No BM, using urinal) Toileting steps completed by helper: Adjust clothing prior to toileting, Performs perineal hygiene, Adjust clothing after toileting Assist level: Two helpers  Function - Air cabin crew transfer activity did not occur: N/A Toilet transfer assistive device: Grab bar Assist level to toilet: Moderate assist (Pt 50 - 74%/lift or lower) Assist level from toilet: Moderate assist (Pt 50 - 74%/lift or lower)  Function - Chair/bed transfer Chair/bed transfer method: Ambulatory Chair/bed transfer assist level: Touching or steadying assistance (Pt > 75%) Chair/bed transfer assistive device: Armrests Chair/bed transfer details: Verbal cues for technique, Manual facilitation for weight  shifting  Function - Locomotion: Wheelchair Type: Manual Max wheelchair distance: 148ft  Assist Level: Moderate assistance (Pt 50 - 74%) Assist Level: Moderate assistance (Pt 50 - 74%) Wheel 150 feet activity did not occur: Safety/medical concerns Function - Locomotion: Ambulation Assistive device: No device Max distance: 150' Assist level: Touching or steadying assistance (Pt > 75%) Assist level: Touching or steadying assistance (Pt > 75%) Assist level: Touching or steadying assistance (Pt > 75%) Assist level: Touching or steadying assistance (Pt > 75%)  Function - Comprehension Comprehension: Auditory Comprehension assist level: Understands basic 50 - 74% of the time/ requires cueing 25 - 49% of the time  Function - Expression Expression: Verbal Expression assist level: Expresses basic 25 - 49% of the time/requires cueing 50 - 75% of the time. Uses single words/gestures.  Function - Social Interaction Social Interaction assist level: Interacts appropriately 25 - 49% of time - Needs frequent redirection.  Function - Problem Solving Problem solving assist level: Solves basic 25 - 49% of the time - needs direction more than half the time to initiate, plan or complete simple activities  Function - Memory Memory assist level: Recognizes or recalls 25 - 49% of the time/requires cueing 50 - 75% of the time Patient normally able to recall (first 3 days only): Location of own room, That he or she is in a hospital  Medical Problem List and Plan:  1. Functional and cognitive deficits secondary to left MCA infarct    Continue CIR PT OT speech, wife will need further education regarding aphasia 2. DVT Prophylaxis/Anticoagulation: Pharmaceutical: Heparin.  3. Pain Management: tylenol prn  4. Mood: Appears depressed. Team to provide ego support. LCSW to follow for evaluation and support.  5. Neuropsych: This patient is not capable of making decisions on his own behalf.  6. Skin/Wound  Care: routine pressure relief measures.  7. Fluids/Electrolytes/Nutrition: Monitor I/O.   8. Hypotension: Will continue to monitor BP. Continue midodrine and florinef for BP support.    Blood pressure remained labile on 3/11   Last orthostatics positive, TED hose +/- abdominal binder as needed Vitals:   05/16/17 2106 05/17/17 0442  BP: 138/62 (!) 152/81  Pulse: 70 76  Resp: 16 16  Temp: (!) 100.4 F (38 C) 99.7 F (37.6 C)  SpO2: 100% 98%   9. COPD: Continue Breo. Albuterol nebs prn.  Left perihilar density is likely from his lung CA.   10. T2DM: Will monitor BS  ac/hs. Continue glipizide and trajenta with SSI for elevated BS.  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  12. CKD   Creatinine 1.81 on 3/7   Cont to monitor 13. Acute blood loss anemia   Hemoglobin 10.6 on 3/6   Continue to monitor   LOS (Days) 6 A FACE TO FACE EVALUATION WAS PERFORMED  Charlett Blake 05/17/2017, 8:47 AM

## 2017-05-17 NOTE — Progress Notes (Signed)
Occupational Therapy Note  Patient Details  Name: Edwin Jones MRN: [id number] Date of Birth: [birth year]  Today's Date: 05/17/2017 OT Missed Time: 90 Minutes Missed Time Reason: MD hold (comment)  Pt missed 90 minutes of skilled OT treatment 2/2 MD hold and pt being transferred off the unit. Will follow up pending plan of care and medical clearance.    Daneen Schick Rande Roylance 05/17/2017, 1:57 PM

## 2017-05-18 ENCOUNTER — Inpatient Hospital Stay (HOSPITAL_COMMUNITY): Payer: Medicare Other | Admitting: Occupational Therapy

## 2017-05-18 ENCOUNTER — Inpatient Hospital Stay (HOSPITAL_COMMUNITY): Payer: Medicare Other

## 2017-05-18 ENCOUNTER — Inpatient Hospital Stay (HOSPITAL_COMMUNITY): Payer: Medicare Other | Admitting: Speech Pathology

## 2017-05-18 ENCOUNTER — Other Ambulatory Visit: Payer: Self-pay

## 2017-05-18 ENCOUNTER — Encounter (HOSPITAL_COMMUNITY): Payer: Self-pay

## 2017-05-18 LAB — BASIC METABOLIC PANEL
Anion gap: 10 (ref 5–15)
BUN: 18 mg/dL (ref 6–20)
CHLORIDE: 104 mmol/L (ref 101–111)
CO2: 22 mmol/L (ref 22–32)
CREATININE: 1.82 mg/dL — AB (ref 0.61–1.24)
Calcium: 8.8 mg/dL — ABNORMAL LOW (ref 8.9–10.3)
GFR calc Af Amer: 40 mL/min — ABNORMAL LOW (ref >60)
GFR calc non Af Amer: 34 mL/min — ABNORMAL LOW (ref >60)
Glucose, Bld: 176 mg/dL — ABNORMAL HIGH (ref 65–99)
Potassium: 4.5 mmol/L (ref 3.5–5.1)
SODIUM: 136 mmol/L (ref 135–145)

## 2017-05-18 LAB — CBC
HEMATOCRIT: 31.7 % — AB (ref 39.0–52.0)
Hemoglobin: 10.1 g/dL — ABNORMAL LOW (ref 13.0–17.0)
MCH: 30.3 pg (ref 26.0–34.0)
MCHC: 31.9 g/dL (ref 30.0–36.0)
MCV: 95.2 fL (ref 78.0–100.0)
PLATELETS: 271 10*3/uL (ref 150–400)
RBC: 3.33 MIL/uL — ABNORMAL LOW (ref 4.22–5.81)
RDW: 13 % (ref 11.5–15.5)
WBC: 5.8 10*3/uL (ref 4.0–10.5)

## 2017-05-18 LAB — GLUCOSE, CAPILLARY
GLUCOSE-CAPILLARY: 156 mg/dL — AB (ref 65–99)
GLUCOSE-CAPILLARY: 169 mg/dL — AB (ref 65–99)
Glucose-Capillary: 94 mg/dL (ref 65–99)
Glucose-Capillary: 97 mg/dL (ref 65–99)
Glucose-Capillary: 163 mg/dL — ABNORMAL HIGH (ref 65–99)

## 2017-05-18 MED ORDER — SODIUM CHLORIDE 0.9 % IV SOLN
INTRAVENOUS | Status: DC
  Administered 2017-05-19: 1000 mL via INTRAVENOUS
  Administered 2017-05-19: 04:00:00 via INTRAVENOUS

## 2017-05-18 MED ORDER — ASPIRIN EC 325 MG PO TBEC: 325.0000 mg | DELAYED_RELEASE_TABLET | Freq: Every day | ORAL | Status: DC

## 2017-05-18 MED ORDER — INSULIN ASPART 100 UNIT/ML ~~LOC~~ SOLN
3.0000 [IU] | Freq: Once | SUBCUTANEOUS | Status: AC
  Administered 2017-05-18: 3 [IU] via SUBCUTANEOUS

## 2017-05-18 MED ORDER — ORAL CARE MOUTH RINSE
15.0000 mL | Freq: Two times a day (BID) | OROMUCOSAL | Status: DC
  Administered 2017-05-18 – 2017-05-20 (×6): 15 mL via OROMUCOSAL

## 2017-05-18 MED ORDER — HEPARIN SODIUM (PORCINE) 5000 UNIT/ML IJ SOLN
5000.0000 [IU] | Freq: Three times a day (TID) | INTRAMUSCULAR | Status: DC
  Administered 2017-05-18 – 2017-05-20 (×7): 5000 [IU] via SUBCUTANEOUS
  Filled 2017-05-18 (×7): qty 1

## 2017-05-18 MED ORDER — INSULIN ASPART 100 UNIT/ML ~~LOC~~ SOLN
0.0000 [IU] | Freq: Four times a day (QID) | SUBCUTANEOUS | Status: DC
  Administered 2017-05-18: 3 [IU] via SUBCUTANEOUS
  Administered 2017-05-18: 5 [IU] via SUBCUTANEOUS
  Administered 2017-05-18 – 2017-05-20 (×7): 3 [IU] via SUBCUTANEOUS

## 2017-05-18 MED ORDER — CHLORHEXIDINE GLUCONATE 0.12 % MT SOLN
15.0000 mL | Freq: Two times a day (BID) | OROMUCOSAL | Status: DC
  Administered 2017-05-18 – 2017-05-20 (×5): 15 mL via OROMUCOSAL
  Filled 2017-05-18 (×3): qty 15

## 2017-05-18 MED ORDER — ASPIRIN 325 MG PO TABS
325.0000 mg | ORAL_TABLET | Freq: Every day | ORAL | Status: DC
  Administered 2017-05-18 – 2017-05-20 (×3): 325 mg via ORAL
  Filled 2017-05-18 (×3): qty 1

## 2017-05-18 MED ORDER — LEVOFLOXACIN 500 MG PO TABS
500.0000 mg | ORAL_TABLET | Freq: Every day | ORAL | Status: DC
  Administered 2017-05-19 – 2017-05-20 (×2): 500 mg via ORAL
  Filled 2017-05-18 (×2): qty 1

## 2017-05-18 MED ORDER — PANTOPRAZOLE SODIUM 40 MG PO TBEC
40.0000 mg | DELAYED_RELEASE_TABLET | Freq: Every day | ORAL | Status: DC
  Administered 2017-05-18 – 2017-05-20 (×3): 40 mg via ORAL
  Filled 2017-05-18 (×3): qty 1

## 2017-05-18 NOTE — Progress Notes (Signed)
OT Cancellation Note  Patient Details Name: Edwin Jones MRN: [id number] DOB: 1940/07/04   Cancelled Treatment:    Reason Eval/Treat Not Completed: Active bedrest order. Will check back as medically appropriate.   Norman Herrlich, MS OTR/L  Pager: [phone]   Norman Herrlich 05/18/2017, 6:51 AM

## 2017-05-18 NOTE — Evaluation (Addendum)
Physical Therapy Evaluation Patient Details Name: Edwin Jones MRN: [id number] DOB: Dec 22, 1940 Today's Date: 05/18/2017   History of Present Illness  Mr. Amado Andal is a 77 y.o. 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 infarct to the left BG, left amygdala and small scattered tiny cortical infarcts throughout the left MCA territory. Who progressed and went to CIR 3/5. Returned to ICU 3/11 with increased lethargy with hemorrhagic conversion of left basal ganglia infarct  Clinical Impression  Pt pleasant with smiling and nodding for mobility. Pt able to follow single step commands both verbally and with gestures with decreased problem solving, awareness, balance and function. Pt with decreased RUE function and need for continued therapy to maximize strength, balance and mobility to decrease burden of care.      Follow Up Recommendations CIR;Supervision/Assistance - 24 hour    Equipment Recommendations  None recommended by PT    Recommendations for Other Services Rehab consult     Precautions / Restrictions Precautions Precautions: Fall Precaution Comments: aphasic      Mobility  Bed Mobility Overal bed mobility: Needs Assistance Bed Mobility: Supine to Sit     Supine to sit: Min guard;HOB elevated     General bed mobility comments: guarding for safety, HOB 30 degrees, increased time and use of rail   Transfers Overall transfer level: Needs assistance   Transfers: Sit to/from Stand;Stand Pivot Transfers Sit to Stand: Min assist Stand pivot transfers: Min assist;+2 safety/equipment       General transfer comment: min assist to stand from bed x 3 trials, from [facility] x1 with pivot from bed>BSC>recliner with HHA and cues for safety and line management. +2 for safety particulary with pt incontinent of stool on initial 3 trials with  assist for pericare  Ambulation/Gait             General Gait Details: deferred secondary to incontinent BM at this time  Stairs            Wheelchair Mobility    Modified Rankin (Stroke Patients Only) Modified Rankin (Stroke Patients Only) Pre-Morbid Rankin Score: Moderately severe disability Modified Rankin: Moderately severe disability     Balance Overall balance assessment: Needs assistance   Sitting balance-Leahy Scale: Fair       Standing balance-Leahy Scale: Fair                               Pertinent Vitals/Pain Pain Assessment: No/denies pain    Home Living Family/patient expects to be discharged to:: Private residence Living Arrangements: Spouse/significant other Available Help at Discharge: Family;Available 24 hours/day Type of Home: House Home Access: Stairs to enter Entrance Stairs-Rails: Right Entrance Stairs-Number of Steps: 2 Home Layout: One level Home Equipment: None      Prior Function Level of Independence: Independent         Comments: pt was indendent prior to admission in feb. Since then had progressed to minguard for ambulation and assist for ADLs     Hand Dominance   Dominant Hand: Right    Extremity/Trunk Assessment   Upper Extremity Assessment Upper Extremity Assessment: Defer to OT evaluation    Lower Extremity Assessment Lower Extremity Assessment: Generalized weakness RLE Deficits / Details: grossly 45 LLE Deficits / Details: WFL    Cervical / Trunk Assessment Cervical / Trunk Assessment: Normal(tendency for flexed  trunk)  Communication   Communication: Expressive difficulties  Cognition Arousal/Alertness: Awake/alert Behavior During Therapy: Flat affect Overall Cognitive Status: Impaired/Different from baseline Area of Impairment: Following commands;Safety/judgement;Problem solving                   Current Attention Level: Sustained   Following Commands: Follows one step  commands consistently Safety/Judgement: Decreased awareness of deficits;Decreased awareness of safety   Problem Solving: Slow processing;Requires verbal cues General Comments: pt smiling and nodding with mostly yes and no answers. Pt incontinent of stool on standing and instead of attempting to gesture for commode reached with hand to catch stool. with cues needed for safety and how to manage self, lines and cleaning      General Comments      Exercises     Assessment/Plan    PT Assessment Patient needs continued PT services  PT Problem List Decreased strength;Decreased activity tolerance;Decreased balance;Decreased mobility;Decreased coordination       PT Treatment Interventions Gait training;Functional mobility training;Therapeutic activities;Balance training;Neuromuscular re-education;Patient/family education    PT Goals (Current goals can be found in the Care Plan section)  Acute Rehab PT Goals Patient Stated Goal: return to walking PT Goal Formulation: With patient/family Time For Goal Achievement: 06/01/17 Potential to Achieve Goals: Good    Frequency Min 3X/week   Barriers to discharge Decreased caregiver support      Co-evaluation               AM-PAC PT "6 Clicks" Daily Activity  Outcome Measure Difficulty turning over in bed (including adjusting bedclothes, sheets and blankets)?: A Little Difficulty moving from lying on back to sitting on the side of the bed? : A Little Difficulty sitting down on and standing up from a chair with arms (e.g., wheelchair, bedside commode, etc,.)?: Unable Help needed moving to and from a bed to chair (including a wheelchair)?: A Little Help needed walking in hospital room?: A Lot Help needed climbing 3-5 steps with a railing? : A Lot 6 Click Score: 14    End of Session Equipment Utilized During Treatment: Gait belt Activity Tolerance: Patient tolerated treatment well Patient left: in chair;with call bell/phone within  reach;with chair alarm set;with family/visitor present Nurse Communication: Mobility status PT Visit Diagnosis: Unsteadiness on feet (R26.81);Other abnormalities of gait and mobility (R26.89);Hemiplegia and hemiparesis Hemiplegia - Right/Left: Right Hemiplegia - dominant/non-dominant: Dominant Hemiplegia - caused by: Cerebral infarction;Nontraumatic intracerebral hemorrhage    Time: 7680-8811 PT Time Calculation (min) (ACUTE ONLY): 38 min   Charges:   PT Evaluation $PT Eval Moderate Complexity: 1 Mod     PT G Codes:        Elwyn Reach, PT [phone]   [place] Temesgen Weightman 05/18/2017, 2:08 PM

## 2017-05-18 NOTE — Evaluation (Signed)
Clinical/Bedside Swallow Evaluation Patient Details  Name: Edwin Jones MRN: [id number] Date of Birth: January 06, 1941  Today's Date: 05/18/2017 Time: SLP Start Time (ACUTE ONLY): 1100 SLP Stop Time (ACUTE ONLY): 1114 SLP Time Calculation (min) (ACUTE ONLY): 14 min  Past Medical History:  Past Medical History:  Diagnosis Date  . Cancer ([place])   . COPD (chronic obstructive pulmonary disease) ([place])   . Diabetes mellitus without complication ([place])   . GERD without esophagitis   . Hepatitis C virus    history of hep C treated with Harvoni  . Hypercholesterolemia   . Hypertension   . Seasonal allergies    Past Surgical History:  Past Surgical History:  Procedure Laterality Date  . COLONOSCOPY     approximately 11 years ago  . FLEXIBLE BRONCHOSCOPY N/A 11/05/2016   Procedure: FLEXIBLE BRONCHOSCOPY;  Surgeon: Wilhelmina Mcardle, MD;  Location: [facility] ORS;  Service: Pulmonary;  Laterality: N/A;  . IR FLUORO GUIDE CV LINE RIGHT  05/06/2017  . IR INTRAVSC STENT CERV CAROTID W/O EMB-PROT MOD SED INC ANGIO  05/06/2017  . IR PERCUTANEOUS ART THROMBECTOMY/INFUSION INTRACRANIAL INC DIAG ANGIO  05/06/2017  . IR US GUIDE VASC ACCESS LEFT  05/06/2017  . IR US GUIDE VASC ACCESS RIGHT  05/06/2017  . IR US GUIDE VASC ACCESS RIGHT  05/06/2017  . PORTA CATH INSERTION N/A 11/23/2016   Procedure: Glori Luis Cath Insertion;  Surgeon: Algernon Huxley, MD;  Location: [facility];  Service: Cardiovascular;  Laterality: N/A;  . RADIOLOGY WITH ANESTHESIA N/A 05/06/2017   Procedure: IR WITH ANESTHESIA;  Surgeon: Radiologist, Medication, MD;  Location: [facility];  Service: Radiology;  Laterality: N/A;  . TEE WITHOUT CARDIOVERSION N/A 05/10/2017   Procedure: TRANSESOPHAGEAL ECHOCARDIOGRAM (TEE);  Surgeon: Sanda Klein, MD;  Location: [facility] ENDOSCOPY;  Service: Cardiovascular;  Laterality: N/A;  . TONSILLECTOMY     HPI:  Edwin Jones is a 77 y/o male who presented by EMS as code stroke. The history is provided by the patient and  the EMS personnel. The history is limited by the condition of the patient (Aphasia). He has history of diabetes, hypertension, hyperlipidemia, COPD, small cell lung cancer and comes in with right-sided weakness. He was last known normal at 1 AM when he walked to go to bed. He woke up at about 2 AM with right-sided weakness and unable to walk. He denies any headache. EMS transported him here as a code stroke. Of note, he just completed prophylactic cranial radiation. His lung cancer has been treated with chemotherapy and radiation treatment, both of which have been completed. 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 Arrangements are made for interventional radiology to treat and MRI revealed restored patency of the Left ICA, terminus, and Left MCA since earlier today. Associated core infarct primarily confined to the Left Basal Ganglia. There is a small 9 mm infarct in the left amygdala, and there are small number of scattered tiny cortical infarcts elsewhere in the Left MCA territory. Mild cytotoxic edema with no associated hemorrhage or mass effect and Mild posterior and right anterior circulation atherosclerosis, stable from the CTA earlier today.   Assessment / Plan / Recommendation Clinical Impression  Pt presents with moderate oral phase dysphagia c/b decreased bolus manipulation d/t right side lingual and facial weakness. Pt has dentures but not interested in wearing during evaluation. Moderate oral phase deficits lead to increased oral transit times and increased oral residue. Pt consumed puree with functional parl phase abilities and complete oral clearing. No  overt s/s of aspiration were observed with thin liquids via straw. Pt with overall increased fatigue as consumption progressed d/t poor task toleration of dysphagia 2 trials and cognitive deficits in sustained attention. As such, recommend conversative diet of dysphagia 1 with thin liquids, medicine crushed with puree.  Education provided to wife and pt, all of wife's questions answered to satisfaction. ST to follow acutely.  SLP Visit Diagnosis: Dysphagia, unspecified (R13.10)    Aspiration Risk  Moderate aspiration risk    Diet Recommendation Dysphagia 1 (Puree);Thin liquid   Liquid Administration via: Straw;Cup Medication Administration: Crushed with puree Supervision: Staff to assist with self feeding;Full supervision/cueing for compensatory strategies Compensations: Minimize environmental distractions;Slow rate;Small sips/bites Postural Changes: Seated upright at 90 degrees    Other  Recommendations Oral Care Recommendations: Oral care BID   Follow up Recommendations Inpatient Rehab      Frequency and Duration min 2x/week  2 weeks       Prognosis Prognosis for Safe Diet Advancement: Fair Barriers to Reach Goals: Severity of deficits      Swallow Study   General Date of Onset: 05/12/17 HPI: Edwin Jones is a 77 y/o male who presented by EMS as code stroke. The history is provided by the patient and the EMS personnel. The history is limited by the condition of the patient (Aphasia). He has history of diabetes, hypertension, hyperlipidemia, COPD, small cell lung cancer and comes in with right-sided weakness. He was last known normal at 1 AM when he walked to go to bed. He woke up at about 2 AM with right-sided weakness and unable to walk. He denies any headache. EMS transported him here as a code stroke. Of note, he just completed prophylactic cranial radiation. His lung cancer has been treated with chemotherapy and radiation treatment, both of which have been completed. 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 Arrangements are made for interventional radiology to treat and MRI revealed restored patency of the Left ICA, terminus, and Left MCA since earlier today. Associated core infarct primarily confined to the Left Basal Ganglia. There is a small 9 mm infarct in the left  amygdala, and there are small number of scattered tiny cortical infarcts elsewhere in the Left MCA territory. Mild cytotoxic edema with no associated hemorrhage or mass effect and Mild posterior and right anterior circulation atherosclerosis, stable from the CTA earlier today. Type of Study: Bedside Swallow Evaluation Previous Swallow Assessment: BSE on 2/28 - regular iwth thin liquids - monitor rate of consumption Diet Prior to this Study: NPO Temperature Spikes Noted: No Respiratory Status: Room air History of Recent Intubation: No Behavior/Cognition: Alert;Cooperative;Pleasant mood Oral Cavity Assessment: Within Functional Limits Oral Care Completed by SLP: Recent completion by staff Oral Cavity - Dentition: Dentures, top;Dentures, bottom;Dentures, not available Vision: Functional for self-feeding Self-Feeding Abilities: Needs assist;Needs set up Patient Positioning: Upright in bed Baseline Vocal Quality: Low vocal intensity Volitional Cough: Strong Volitional Swallow: Able to elicit    Oral/Motor/Sensory Function Overall Oral Motor/Sensory Function: Severe impairment Facial ROM: Reduced right Facial Symmetry: Abnormal symmetry right Facial Strength: Reduced right Facial Sensation: Reduced right Lingual ROM: Reduced right Lingual Symmetry: Abnormal symmetry right Lingual Strength: Reduced Lingual Sensation: Within Functional Limits Velum: Within Functional Limits Mandible: Within Functional Limits   Ice Chips Ice chips: Within functional limits Presentation: Spoon   Thin Liquid Thin Liquid: Within functional limits Presentation: Cup;Self Fed;Straw    Nectar Thick Nectar Thick Liquid: Not tested   Honey Thick Honey Thick Liquid: Not tested  Puree Puree: Within functional limits Presentation: Spoon   Solid   GO   Solid: Impaired Presentation: Spoon Oral Phase Impairments: Reduced lingual movement/coordination;Impaired mastication Oral Phase Functional Implications: Oral  residue;Impaired mastication;Prolonged oral transit        Edwin Jones 05/18/2017,12:17 PM

## 2017-05-18 NOTE — Progress Notes (Signed)
Pt admitted to room 3w29 from 4n ICU.  Pt nods head appropriately when asked questions.  He does have expressive aphasia.  He understands to use call bell and I have instructed the secretary to send someone to room when called.  Bed alarm set.  Tele on patient; CCMD called and will have RN verify telemetry.

## 2017-05-18 NOTE — Evaluation (Signed)
Speech Language Pathology Evaluation Patient Details Name: Edwin Jones MRN: [id number] DOB: Aug 05, 1940 Today's Date: 05/18/2017 Time: 1045-1100 SLP Time Calculation (min) (ACUTE ONLY): 15 min  Problem List:  Patient Active Problem List   Diagnosis Date Noted  . Nontraumatic acute hemorrhage of basal ganglia ([place]) 05/17/2017  . Anterior cerebral circulation hemorrhagic infarction 05/17/2017  . Pneumonia   . Cerebral edema ([facility])   . Cough   . Fever, unspecified   . Orthostatic hypotension   . Labile blood pressure   . Acute blood loss anemia   . Diabetes mellitus type 2 in nonobese (H[facility])   . Labile blood glucose   . CKD (chronic kidney disease) 05/11/2017  . Infarction of left basal ganglia ([place]) 05/11/2017  . Idiopathic hypotension 05/09/2017  . Stroke ([place]) 05/06/2017  . Stroke (cerebrum) ([place]) 05/06/2017  . 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 Cancer of upper lobe of left lung ([place]) 10/30/2016  . Pure hypercholesterolemia 06/22/2014  . GERD without esophagitis 06/22/2014  . Benign essential hypertension 06/22/2014  . Diabetes mellitus without complication ([place]) 11/29/3005   Past Medical History:  Past Medical History:  Diagnosis Date  . Cancer ([place])   . COPD (chronic obstructive pulmonary disease) ([place])   . Diabetes mellitus without complication ([place])   . GERD without esophagitis   . Hepatitis C virus    history of hep C treated with Harvoni  . Hypercholesterolemia   . Hypertension   . Seasonal allergies    Past Surgical History:  Past Surgical History:  Procedure Laterality Date  . COLONOSCOPY     approximately 11 years ago  . FLEXIBLE BRONCHOSCOPY N/A 11/05/2016   Procedure: FLEXIBLE BRONCHOSCOPY;  Surgeon: Wilhelmina Mcardle, MD;  Location: [facility] ORS;  Service: Pulmonary;  Laterality: N/A;  . IR FLUORO GUIDE CV LINE RIGHT  05/06/2017  . IR INTRAVSC STENT CERV CAROTID W/O EMB-PROT MOD SED INC ANGIO  05/06/2017  . IR PERCUTANEOUS ART  THROMBECTOMY/INFUSION INTRACRANIAL INC DIAG ANGIO  05/06/2017  . IR US GUIDE VASC ACCESS LEFT  05/06/2017  . IR US GUIDE VASC ACCESS RIGHT  05/06/2017  . IR US GUIDE VASC ACCESS RIGHT  05/06/2017  . PORTA CATH INSERTION N/A 11/23/2016   Procedure: Glori Luis Cath Insertion;  Surgeon: Algernon Huxley, MD;  Location: [facility];  Service: Cardiovascular;  Laterality: N/A;  . RADIOLOGY WITH ANESTHESIA N/A 05/06/2017   Procedure: IR WITH ANESTHESIA;  Surgeon: Radiologist, Medication, MD;  Location: [facility];  Service: Radiology;  Laterality: N/A;  . TEE WITHOUT CARDIOVERSION N/A 05/10/2017   Procedure: TRANSESOPHAGEAL ECHOCARDIOGRAM (TEE);  Surgeon: Sanda Klein, MD;  Location: [facility] ENDOSCOPY;  Service: Cardiovascular;  Laterality: N/A;  . TONSILLECTOMY     HPI:  [name] is a 77 y/o male who presented by EMS as code stroke. The history is provided by the patient and the EMS personnel. The history is limited by the condition of the patient (Aphasia). He has history of diabetes, hypertension, hyperlipidemia, COPD, small cell lung cancer and comes in with right-sided weakness. He was last known normal at 1 AM when he walked to go to bed. He woke up at about 2 AM with right-sided weakness and unable to walk. He denies any headache. EMS transported him here as a code stroke. Of note, he just completed prophylactic cranial radiation. His lung cancer has been treated with chemotherapy and radiation treatment, both of which have been completed. 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 Arrangements are  made for interventional radiology to treat and MRI revealed restored patency of the Left ICA, terminus, and Left MCA since earlier today. Associated core infarct primarily confined to the Left Basal Ganglia. There is a small 9 mm infarct in the left amygdala, and there are small number of scattered tiny cortical infarcts elsewhere in the Left MCA territory. Mild cytotoxic edema with no  associated hemorrhage or mass effect and Mild posterior and right anterior circulation atherosclerosis, stable from the CTA earlier today.   Assessment / Plan / Recommendation Clinical Impression  Pt presents with severe expressive > receptive aphasia and is largely nonverbal at this time. Pt is able to gesturally answer very basic yes/no questions, recognize this Probation officer (from previous sessions), follow basic 1 step directions with Mod A cues, sustain attention for ~ 2 minute intervals. Pt is nonfluent and brief verbal expressions yielded phonemic perseverations. Pt presents with worsened aphasia and as such requires skilled ST to increase communicative abilities.    SLP Assessment  SLP Recommendation/Assessment: Patient needs continued Speech Lanaguage Pathology Services SLP Visit Diagnosis: Aphasia (R47.01)    Follow Up Recommendations  Inpatient Rehab    Frequency and Duration min 2x/week  2 weeks      SLP Evaluation Cognition  Overall Cognitive Status: Impaired/Different from baseline Arousal/Alertness: Awake/alert Orientation Level: Oriented to person;Oriented to place;Oriented to situation;Disoriented to time(Via yes/no questions) Attention: Sustained Sustained Attention: Impaired Sustained Attention Impairment: Verbal basic;Functional basic Selective Attention: Impaired Selective Attention Impairment: Verbal basic;Functional basic Memory: (difficult to assess d/t aphasia)       Comprehension  Auditory Comprehension Overall Auditory Comprehension: Impaired Yes/No Questions: Impaired Basic Immediate Environment Questions: 50-74% accurate Complex Questions: 0-24% accurate Commands: Impaired One Step Basic Commands: 0-24% accurate Two Step Basic Commands: 0-24% accurate Visual Recognition/Discrimination Discrimination: Not tested Reading Comprehension Reading Status: Not tested    Expression Expression Primary Mode of Expression: Verbal Verbal Expression Overall  Verbal Expression: Impaired(pt is almost nonverbal, some speech noted but garbled) Initiation: Impaired Level of Generative/Spontaneous Verbalization: Word Repetition: Impaired Level of Impairment: Word level Naming: Impairment Pragmatics: Impairment Impairments: Eye contact;Abnormal affect Interfering Components: Attention Non-Verbal Means of Communication: Gestures Written Expression Dominant Hand: Right Written Expression: Not tested   Oral / Motor  Oral Motor/Sensory Function Overall Oral Motor/Sensory Function: Severe impairment Facial ROM: Reduced right Facial Symmetry: Abnormal symmetry right Facial Strength: Reduced right Facial Sensation: Reduced right Lingual ROM: Reduced right Lingual Symmetry: Abnormal symmetry right Lingual Strength: Reduced Motor Speech Overall Motor Speech: Impaired Respiration: Within functional limits Phonation: Low vocal intensity Articulation: Impaired Level of Impairment: Word Intelligibility: Intelligibility reduced Word: 0-24% accurate Phrase: 0-24% accurate Sentence: Not tested Conversation: Not tested Motor Planning: Witnin functional limits Motor Speech Errors: Not applicable   GO                    Kim Lauver 05/18/2017, 12:09 PM

## 2017-05-18 NOTE — Progress Notes (Signed)
STROKE TEAM PROGRESS NOTE   SUBJECTIVE (INTERVAL HISTORY) His wife is at the bedside.  Overall he feels his condition is stable. Still has speech difficulty but able to repeat but hypophonia. Able to tell me his name but not orientated to time or place.   OBJECTIVE Temp:  [98.5 F (36.9 C)-99.4 F (37.4 C)] 98.8 F (37.1 C) (03/12 0800) Pulse Rate:  [60-76] 65 (03/12 1100) Cardiac Rhythm: Normal sinus rhythm (03/12 0800) Resp:  [12-23] 17 (03/12 1100) BP: (110-166)/(57-119) 143/69 (03/12 1100) SpO2:  [89 %-100 %] 96 % (03/12 1100) Weight:  [207 lb 3.7 oz (94 kg)] 207 lb 3.7 oz (94 kg) (03/11 1500)  Recent Labs  Lab 05/17/17 0632 05/17/17 1151 05/17/17 1639 05/17/17 2034 05/18/17 0846  GLUCAP 134* 136* 94 97 163*   Recent Labs  Lab 05/12/17 0753 05/13/17 0653 05/17/17 1257 05/17/17 1719 05/18/17 0601  NA 139 139 135  --  136  K 4.1 4.4 5.6* 4.1 4.5  CL 107 108 102  --  104  CO2 21* 21* 19*  --  22  GLUCOSE 219* 145* 140*  --  176*  BUN 20 20 21*  --  18  CREATININE 1.80* 1.81* 1.92*  --  1.82*  CALCIUM 8.5* 8.7* 9.0  --  8.8*   Recent Labs  Lab 05/12/17 0753  AST 16  ALT 11*  ALKPHOS 68  BILITOT 0.5  PROT 5.9*  ALBUMIN 2.4*   Recent Labs  Lab 05/12/17 0753 05/16/17 1339 05/17/17 1257 05/18/17 0601  WBC 5.9 6.4 6.8 5.8  NEUTROABS 4.3 5.0 4.6  --   HGB 10.6* 11.3* 12.0* 10.1*  HCT 33.1* 34.9* 36.7* 31.7*  MCV 95.9 95.1 95.8 95.2  PLT 201 292 276 271   No results for input(s): CKTOTAL, CKMB, CKMBINDEX, TROPONINI in the last 168 hours. No results for input(s): LABPROT, INR in the last 72 hours. Recent Labs    05/16/17 1814  COLORURINE YELLOW  LABSPEC 1.020  PHURINE 5.0  GLUCOSEU NEGATIVE  HGBUR NEGATIVE  BILIRUBINUR NEGATIVE  KETONESUR NEGATIVE  PROTEINUR NEGATIVE  NITRITE NEGATIVE  LEUKOCYTESUR NEGATIVE       Component Value Date/Time   CHOL 173 05/07/2017 0500   TRIG 215 (H) 05/07/2017 0500   HDL 61 05/07/2017 0500   CHOLHDL 2.8  05/07/2017 0500   VLDL 43 (H) 05/07/2017 0500   LDLCALC 69 05/07/2017 0500   Lab Results  Component Value Date   HGBA1C 9.1 (H) 05/07/2017      Component Value Date/Time   LABOPIA NONE DETECTED 05/06/2017 1011   COCAINSCRNUR NONE DETECTED 05/06/2017 1011   LABBENZ NONE DETECTED 05/06/2017 1011   AMPHETMU NONE DETECTED 05/06/2017 1011   THCU NONE DETECTED 05/06/2017 1011   LABBARB NONE DETECTED 05/06/2017 1011    No results for input(s): ETH in the last 168 hours.  I have personally reviewed the radiological images below and agree with the radiology interpretations.  Dg Chest 2 View  Result Date: 05/16/2017 CLINICAL DATA:  Cough, shortness of breath, chest pain for 2 days. EXAM: CHEST - 2 VIEW COMPARISON:  Chest x-rays dated 05/11/2017, 05/06/2017 and 03/18/2017. FINDINGS: Heart size and mediastinal contours are within normal limits. Right chest wall Port-A-Cath is stable in position with tip at the level of the mid/upper SVC. The left perihilar opacity is unchanged in the short-term interval. Right lung remains clear. No new lung findings. No pleural effusion or pneumothorax seen. IMPRESSION: 1. Ill-defined opacity within the left perihilar lung is unchanged  in the short-term interval, at least mildly improved compared to the earlier chest x-ray of 05/06/2017. Earlier CT of 03/24/2017 showed an associated pulmonary mass in this area. As indicated on the earlier chest x-ray report, findings could represent associated atelectasis, postobstructive pneumonitis or local tumor spread. 2. No new lung findings. Electronically Signed   By: Franki Cabot M.D.   On: 05/16/2017 15:15   Ct Head Wo Contrast  Result Date: 05/18/2017 CLINICAL DATA:  77 y/o  M; intracranial hemorrhage for follow-up. EXAM: CT HEAD WITHOUT CONTRAST TECHNIQUE: Contiguous axial images were obtained from the base of the skull through the vertex without intravenous contrast. COMPARISON:  05/17/2017 CT head FINDINGS: Brain:  Stable hemorrhage centered in left basal ganglia measuring 4.3 x 2.7 x 3.2 cm (volume = 19 cm^3). Stable surrounding edema extending into the left frontal lobe, left temporal lobe, left insula, and left midbrain with associated local mass effect resulting in partial effacement of left lateral ventricle and 4 mm of left-to-right midline shift. Stable additional subcentimeter focus of hemorrhage within the left postcentral gyrus (series 3, image 28). No new intracranial hemorrhage, focal mass effect, or stroke identified. No extra-axial collection or effacement of basilar cisterns. Stable ventricle size. Vascular: Calcific atherosclerosis of carotid siphons. No hyperdense vessel identified. Skull: Normal. Negative for fracture or focal lesion. Sinuses/Orbits: No acute finding. Other: None. IMPRESSION: 1. Stable hematoma in left basal ganglia, 19 cc. Stable surrounding edema and associated mass effect with 4 mm left-to-right midline shift and partial effacement of left lateral ventricle. Stable subcentimeter focus of hemorrhage in left postcentral gyrus. 2. No new acute intracranial abnormality identified. Electronically Signed   By: Kristine Garbe M.D.   On: 05/18/2017 05:49   Ct Head Wo Contrast  Addendum Date: 05/17/2017   ADDENDUM REPORT: 05/17/2017 13:48 ADDENDUM: Critical Value/emergent results were called by telephone at the time of interpretation on 05/17/2017 at 1327 hours to Dr. Reesa Chew , who verbally acknowledged these results. We discussed that the intracranial hemorrhage appears acute, but occurs in the area of left basal ganglia infarction seen on 05/06/2017. If related, this would be a Heidelberg type 2 Bleed given the mass effect. Although a period of 10-11 days seems atypical for hemorrhagic conversion or post therapy bleeding after cerebral infarct. The patient is reportedly still on Plavix and heparin. I recommended she discuss with Neurology. Electronically Signed   By: Genevie Ann M.D.    On: 05/17/2017 13:48   Result Date: 05/17/2017 CLINICAL DATA:  77 year old male status post left ICA and MCA emergent large vessel occlusion on 05/06/2017 status post endovascular treatment. Worsening speech and weakness today. Initial encounter. EXAM: CT HEAD WITHOUT CONTRAST TECHNIQUE: Contiguous axial images were obtained from the base of the skull through the vertex without intravenous contrast. COMPARISON:  Post treatment brain MRI 05/06/2017 and earlier. FINDINGS: Brain: Lobulated hyperdense hemorrhage centered at the left basal ganglia area of infarct in February encompasses 43 x 27 x 32 millimeters (AP by transverse by CC) for an estimated intra-axial blood volume of 19 milliliters. There is surrounding edema. There is regional mass effect including rightward midline shift of 5 millimeters. Mass effect on the left lateral ventricle with no intraventricular or extra-axial extension of hemorrhage. Cerebral edema is superimposed on chronic bilateral white matter hypodensity. Edema tracks into the left thalamus and midbrain. The basilar cisterns remain normal. Below the midbrain the posterior fossa gray-white matter differentiation remains normal. No superimposed cytotoxic edema identified in the brain. Vascular: Calcified atherosclerosis at the  skull base. Skull: No acute osseous abnormality identified. Sinuses/Orbits: Clear. Other: Leftward gaze deviation. Visualized scalp soft tissues are within normal limits. IMPRESSION: 1. Acute hemorrhage in the left basal ganglia. Estimated blood volume 19 mL. Surrounding edema, tracking through the left thalamus to the midbrain. Regional mass effect including partial effacement of the left lateral ventricle and rightward midline shift of 5 millimeters. 2. No intraventricular or extra-axial hemorrhage extension. No ventriculomegaly. 3. Underlying chronic small vessel disease. Electronically Signed: By: Genevie Ann M.D. On: 05/17/2017 13:19   Chest Port 1 View  Result  Date: 05/18/2017 CLINICAL DATA:  Pneumonia. EXAM: PORTABLE CHEST 1 VIEW COMPARISON:  One-view chest x-ray 05/16/2017 FINDINGS: Superior segment left lower lobe or left upper lobe airspace disease has increased since the prior exam. No significant right-sided consolidation is present. Mild hilar prominence is noted. A right IJ Port-A-Cath is stable. A left pleural effusion is suspected. IMPRESSION: 1. Progressive superior segment left lower lobe or left upper lobe airspace disease compatible with pneumonia. 2. Small left pleural effusion and basilar airspace disease, likely atelectasis. Electronically Signed   By: San Morelle M.D.   On: 05/18/2017 09:20    PHYSICAL EXAM  Temp:  [98.5 F (36.9 C)-99.4 F (37.4 C)] 98.8 F (37.1 C) (03/12 0800) Pulse Rate:  [60-76] 65 (03/12 1100) Resp:  [12-23] 17 (03/12 1100) BP: (110-166)/(57-119) 143/69 (03/12 1100) SpO2:  [89 %-100 %] 96 % (03/12 1100) Weight:  [207 lb 3.7 oz (94 kg)] 207 lb 3.7 oz (94 kg) (03/11 1500)  General - well nourished, well developed, in no apparent distress.    Ophthalmologic - fundi not visualized due to noncooperation.    Cardiovascular - regular rate and rhythm  Neuro -  Eyes open, awake, lethargic, only able to tell me his last name and not other orientation questions. Very limited sound output, anomia, but able to repeat simple sentences with moderate to severe dysarthria. Does not following commands but able to pantomime gestures. PERRL, EOMI, blinking to visual threat bilaterally, right facial droop, tongue midline in mouth. BUE 4/5 proximal and 3/5 distally, BLE 3/5 proximal and 3+/5 distally. DTR 1+ and no babinski. Sensation, coordination and gait not tested.    ASSESSMENT/PLAN Mr. Cheng Dec is a 77 y.o. 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 MRI/MRA brain showed restored patency of L-ICA and L-MCA, mild cytotoxic edema and left basal ganglia infarct, small infarct in left amygdala and small number of scatterd tiny cortical infarcts in L-MCA territory. He had problems with hypotension after ICA stenting was put on oral midodrine and florinef for BP support. TEE  Unremarkable and loop recorder recommended to rule out A fib. Pt admitted to CIR on 05/11/17 and has been doing well with rehab for the last week. However, had fever 101 yesterday, put on levaquin. Today pt lethargic with AMS, essentially non verbal, CT stat which showed hemorrhagic conversion of left BG infarct with 61mm midline shift.   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 Yes; Fluid consistency: Thin   aspirin 81 mg daily prior to admission, was on aspirin 325 mg daily and integrelin. ASA and plavix on hold after hemorrhagic conversion, now restart ASA given stable hematoma  Ongoing aggressive stroke risk factor management  Therapy recommendations:  CIR   Disposition:  Pending  Small cell lung cancer    11/2016 brain MRI no brain metastasis  On chemo and radiation  Follows with [facility]  Discussed with Dr. Rogue Bussing, pt primary oncologist, no concern of hypercoagulable state at this time  Diabetes  HgbA1c 9.1 goal < 7.0  Glucose stable  CBG monitoring  SSI  DM coordinator consult   Hypertension with hypotension after stent  Was on florinef and midodrine  Now off florinef and midodrine   BP stable  Close monitoring  Hyperlipidemia  Home meds:  none   LDL 69, goal < 70  Statin allergy listed in file  CKD  Cre 1.98->1.80->1.88->1.82  At baseline   On gentle IVF  Dysphagia   Passed swallow  Decrease IVF  Speech to follow   Other Stroke Risk Factors  Advanced age  Former smoker - quit 13 years ago  Other Active Problems  Anemia due to CKD - Hb 11.3->12.0->10.1  Hospital day # 1  This patient is critically ill due to 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 This patient's care requires constant monitoring of vital signs, hemodynamics, respiratory and cardiac monitoring, review of multiple databases, neurological assessment, discussion with family, other specialists and medical decision making of high complexity. I had long discussion with wife at bedside and daughter on the phone, updated pt current condition, treatment plan and potential prognosis. They expressed understanding and appreciation. I spent 35 minutes of neurocritical care time in the care of this patient.   Rosalin Hawking, MD PhD Stroke Neurology 05/18/2017 12:53 PM    To contact Stroke Continuity provider, please refer to [email or web address] After hours, contact General Neurology

## 2017-05-18 NOTE — Progress Notes (Signed)
PT Cancellation Note  Patient Details Name: Edwin Jones MRN: [id number] DOB: 1940/04/18   Cancelled Treatment:    Reason Eval/Treat Not Completed: Active bedrest order   Arieh Bogue B Umair Rosiles 05/18/2017, 7:15 AM  Elwyn Reach, [place]

## 2017-05-18 NOTE — Progress Notes (Signed)
Inpatient Rehabilitation  Patient is known to me from a recent IP Rehab admission; discharged back to acute due to a change in functional status due to, CT revealed acute left basal ganglia hemorrhage in area of prior infarct with surrounding edema, regional mass effect and 5 mm rightward midline shift.  Will plan to follow along for team's recommendations for post acute rehab.  Await OT/PT evals. Thanks and call if questions.   Carmelia Roller., CCC/SLP Admission Coordinator  [place]  Cell [phone]

## 2017-05-18 NOTE — Evaluation (Signed)
Occupational Therapy Evaluation Patient Details Name: Edwin Jones MRN: [id number] DOB: [birth year] Today's Date: 05/18/2017    History of Present Illness [name] is a 77 y.o. 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 infarct to the left BG, left amygdala and small scattered tiny cortical infarcts throughout the left MCA territory. Who progressed and went to CIR 3/5. Returned to ICU 3/11 with increased lethargy with hemorrhagic conversion of left basal ganglia infarct   Clinical Impression   Pt with recent admission to CIR after L MCA. Pt continued to be highly motivated to participate in therapy. Demonstrating decreased cognition as seen by poor attention, problem solving, and memory. Pt presenting with right side weakness and decreased balance. Pt will require further acute OT to facilitate safe dc. Recommend return to CIR to continue intensive OT and optimize safety and independence with ADLs and functional mobility.     Follow Up Recommendations  CIR;Supervision/Assistance - 24 hour    Equipment Recommendations  Other (comment)(Defer to next venue)    Recommendations for Other Services Rehab consult;PT consult;Speech consult     Precautions / Restrictions Precautions Precautions: Fall Precaution Comments: aphasic Restrictions Weight Bearing Restrictions: No      Mobility Bed Mobility Overal bed mobility: Needs Assistance Bed Mobility: Supine to Sit     Supine to sit: Min guard;HOB elevated     General bed mobility comments: guarding for safety, HOB 30 degrees, increased time and use of rail   Transfers Overall transfer level: Needs assistance Equipment used: None Transfers: Sit to/from [facility] Sit to Stand: Min assist Stand pivot transfers: Min assist;+2 safety/equipment       General  transfer comment: min assist to stand from bed x 3 trials, from [facility] x1 with pivot from bed>BSC>recliner with HHA and cues for safety and line management. +2 for safety particulary with pt incontinent of stool on initial 3 trials with assist for pericare    Balance Overall balance assessment: Needs assistance Sitting-balance support: Feet supported;No upper extremity supported Sitting balance-Leahy Scale: Fair     Standing balance support: During functional activity Standing balance-Leahy Scale: Fair Standing balance comment: reliant on external support                           ADL either performed or assessed with clinical judgement   ADL Overall ADL's : Needs assistance/impaired Eating/Feeding: Set up;Supervision/ safety;Sitting   Grooming: Wash/dry hands;Set up;Sitting   Upper Body Bathing: Minimal assistance;Sitting;Cueing for sequencing   Lower Body Bathing: Minimal assistance;Sit to/from stand   Upper Body Dressing : Minimal assistance;Sitting;Cueing for sequencing Upper Body Dressing Details (indicate cue type and reason): Requiring max cues for seuqencing and cognition to don gown Lower Body Dressing: Minimal assistance;Sit to/from stand   Toilet Transfer: Minimal assistance;+2 for safety/equipment;BSC;Stand-pivot Toilet Transfer Details (indicate cue type and reason): Pt performing stand pivot to [facility] with Min A +2. Incontinant of stool.  Toileting- Water quality scientist and Hygiene: Sit to/from stand;Moderate assistance Toileting - Clothing Manipulation Details (indicate cue type and reason): Mod A for toilet hygiene after BM.      Functional mobility during ADLs: Min guard;Rolling walker General ADL Comments: Pt demonstrating decreased fucntional performance. Pt with poor cognition, balance, and fucnitonal use of RUE.     Vision Baseline Vision/History: Wears glasses Wears Glasses: Distance only;Reading only Patient Visual  Report: No change from  baseline Additional Comments: Need to further assess     Perception     Praxis      Pertinent Vitals/Pain Faces Pain Scale: Hurts a little bit Pain Location: headache Pain Descriptors / Indicators: Aching     Hand Dominance Right   Extremity/Trunk Assessment Upper Extremity Assessment Upper Extremity Assessment: RUE deficits/detail RUE Deficits / Details: Decreased grasp strength and poor cooridnation. required increased cues, time, and effort for finger opposition. undershooting during finger-to-nose test presenting with dysmetria.  RUE Coordination: decreased fine motor;decreased gross motor   Lower Extremity Assessment Lower Extremity Assessment: Defer to PT evaluation   Cervical / Trunk Assessment Cervical / Trunk Assessment: Normal(tendency for flexed trunk)   Communication Communication Communication: Expressive difficulties   Cognition Arousal/Alertness: Awake/alert Behavior During Therapy: Flat affect Overall Cognitive Status: Impaired/Different from baseline Area of Impairment: Following commands;Safety/judgement;Problem solving;Attention;Memory;Awareness                 Orientation Level: ("Amanda" "November") Current Attention Level: Sustained Memory: Decreased short-term memory Following Commands: Follows one step commands with increased time Safety/Judgement: Decreased awareness of deficits;Decreased awareness of safety Awareness: Emergent Problem Solving: Slow processing;Requires verbal cues;Difficulty sequencing General Comments: Slow processing and required inreased time throughout session. pt smiling and nodding with mostly yes and no answers. Pt incontinent of stool on standing and instead of attempting to gesture for commode reached with hand to catch stool. with cues needed for safety and how to manage self, lines and cleaning   General Comments  Wife present throughout session    Exercises     Shoulder Instructions      Home Living  Family/patient expects to be discharged to:: Private residence Living Arrangements: Spouse/significant other Available Help at Discharge: Family;Available 24 hours/day Type of Home: House Home Access: Stairs to enter [employer] of Steps: 2 Entrance Stairs-Rails: Right Home Layout: One level     Bathroom Shower/Tub: Teacher, early years/pre: Standard     Home Equipment: None      Lives With: Spouse    Prior Functioning/Environment Level of Independence: Independent        Comments: pt was indendent prior to admission in feb. Since then had progressed to minguard for ambulation and assist for ADLs        OT Problem List: Decreased strength;Decreased range of motion;Decreased activity tolerance;Impaired balance (sitting and/or standing);Impaired vision/perception;Decreased coordination;Decreased cognition;Decreased safety awareness;Decreased knowledge of use of DME or AE;Decreased knowledge of precautions;Impaired UE functional use;Pain      OT Treatment/Interventions: Self-care/ADL training;Therapeutic exercise;Energy conservation;DME and/or AE instruction;Therapeutic activities;Patient/family education    OT Goals(Current goals can be found in the care plan section) Acute Rehab OT Goals Patient Stated Goal: return to walking OT Goal Formulation: With patient Time For Goal Achievement: 06/01/17 Potential to Achieve Goals: Good ADL Goals Pt Will Perform Grooming: with supervision;with set-up;standing Pt Will Perform Upper Body Dressing: with modified independence;sitting Pt Will Perform Lower Body Dressing: with set-up;with supervision;sit to/from stand Pt Will Transfer to Toilet: with set-up;with supervision;bedside commode;ambulating Pt Will Perform Toileting - Clothing Manipulation and hygiene: with set-up;with supervision;sit to/from stand  OT Frequency: Min 2X/week   Barriers to D/C:            Co-evaluation              AM-PAC PT "6  Clicks" Daily Activity     Outcome Measure Help from another person eating meals?: None Help from another person taking care of personal  grooming?: A Little Help from another person toileting, which includes using toliet, bedpan, or urinal?: A Little Help from another person bathing (including washing, rinsing, drying)?: A Little Help from another person to put on and taking off regular upper body clothing?: A Little Help from another person to put on and taking off regular lower body clothing?: A Little 6 Click Score: 19   End of Session Equipment Utilized During Treatment: Gait belt Nurse Communication: Mobility status;Precautions  Activity Tolerance: Patient tolerated treatment well Patient left: in chair;with call bell/phone within reach;with family/visitor present;with chair alarm set  OT Visit Diagnosis: Unsteadiness on feet (R26.81);Other abnormalities of gait and mobility (R26.89);Muscle weakness (generalized) (M62.81);Other symptoms and signs involving cognitive function;Hemiplegia and hemiparesis Hemiplegia - Right/Left: Right Hemiplegia - dominant/non-dominant: Dominant Hemiplegia - caused by: Cerebral infarction                Time: 4709-6283 OT Time Calculation (min): 37 min Charges:  OT General Charges $OT Visit: 1 Visit OT Evaluation $OT Eval Moderate Complexity: 1 Mod G-Codes:     [place] MSOT, OTR/L Acute Rehab Pager: [phone] Office: [place] 05/18/2017, 5:54 PM

## 2017-05-18 NOTE — Plan of Care (Signed)
Pt seen by speech and cleared for Dysphagia 1 Diet.  As patient progresses his diet should be cleared to regular.  Katherine Mantle RN

## 2017-05-19 LAB — GLUCOSE, CAPILLARY
GLUCOSE-CAPILLARY: 180 mg/dL — AB (ref 65–99)
GLUCOSE-CAPILLARY: 196 mg/dL — AB (ref 65–99)
Glucose-Capillary: 216 mg/dL — ABNORMAL HIGH (ref 65–99)
Glucose-Capillary: 191 mg/dL — ABNORMAL HIGH (ref 65–99)
Glucose-Capillary: 152 mg/dL — ABNORMAL HIGH (ref 65–99)
Glucose-Capillary: 172 mg/dL — ABNORMAL HIGH (ref 65–99)

## 2017-05-19 LAB — CBC
HEMATOCRIT: 32.6 % — AB (ref 39.0–52.0)
HEMOGLOBIN: 10.5 g/dL — AB (ref 13.0–17.0)
MCH: 30.6 pg (ref 26.0–34.0)
MCHC: 32.2 g/dL (ref 30.0–36.0)
MCV: 95 fL (ref 78.0–100.0)
Platelets: 285 10*3/uL (ref 150–400)
RBC: 3.43 MIL/uL — AB (ref 4.22–5.81)
RDW: 13 % (ref 11.5–15.5)
WBC: 5.5 10*3/uL (ref 4.0–10.5)

## 2017-05-19 LAB — BASIC METABOLIC PANEL
ANION GAP: 9 (ref 5–15)
BUN: 16 mg/dL (ref 6–20)
CALCIUM: 8.7 mg/dL — AB (ref 8.9–10.3)
CO2: 21 mmol/L — ABNORMAL LOW (ref 22–32)
Chloride: 105 mmol/L (ref 101–111)
Creatinine, Ser: 1.88 mg/dL — ABNORMAL HIGH (ref 0.61–1.24)
GFR, EST AFRICAN AMERICAN: 38 mL/min — AB (ref >60)
GFR, EST NON AFRICAN AMERICAN: 33 mL/min — AB (ref >60)
Glucose, Bld: 196 mg/dL — ABNORMAL HIGH (ref 65–99)
POTASSIUM: 4.5 mmol/L (ref 3.5–5.1)
Sodium: 135 mmol/L (ref 135–145)

## 2017-05-19 NOTE — Progress Notes (Signed)
Per patients wife patient was sleeping and began having shaking of the head at 1522 that approximately lasted 15sec.  Text paged Dr Erlinda Hong. He will order an EEG for tomorrow morning before tx patient to inpatient rehab.

## 2017-05-19 NOTE — Progress Notes (Signed)
STROKE TEAM PROGRESS NOTE   SUBJECTIVE (INTERVAL HISTORY) His wife is at the bedside.  Overall he feels his condition is stable. Still has speech difficulty but able to repeat with hypophonia. Wife stated at 1522 pt was sleeping had 15 sec of head shaking. Wife woke up pt and pt was at his baseline. Will do EEG in am.     OBJECTIVE Temp:  [98.1 F (36.7 C)-98.7 F (37.1 C)] 98.2 F (36.8 C) (03/13 0930) Pulse Rate:  [70-77] 77 (03/13 1500) Cardiac Rhythm: Normal sinus rhythm (03/13 1500) Resp:  [16-20] 16 (03/13 0930) BP: (129-148)/(59-67) 148/67 (03/13 1500) SpO2:  [97 %-99 %] 97 % (03/13 1500)  Recent Labs  Lab 05/18/17 2123 05/19/17 0618 05/19/17 0900 05/19/17 1224 05/19/17 1723  GLUCAP 169* 180* 196* 191* 152*   Recent Labs  Lab 05/13/17 0653 05/17/17 1257 05/17/17 1719 05/18/17 0601 05/19/17 0556  NA 139 135  --  136 135  K 4.4 5.6* 4.1 4.5 4.5  CL 108 102  --  104 105  CO2 21* 19*  --  22 21*  GLUCOSE 145* 140*  --  176* 196*  BUN 20 21*  --  18 16  CREATININE 1.81* 1.92*  --  1.82* 1.88*  CALCIUM 8.7* 9.0  --  8.8* 8.7*   No results for input(s): AST, ALT, ALKPHOS, BILITOT, PROT, ALBUMIN in the last 168 hours. Recent Labs  Lab 05/16/17 1339 05/17/17 1257 05/18/17 0601 05/19/17 0556  WBC 6.4 6.8 5.8 5.5  NEUTROABS 5.0 4.6  --   --   HGB 11.3* 12.0* 10.1* 10.5*  HCT 34.9* 36.7* 31.7* 32.6*  MCV 95.1 95.8 95.2 95.0  PLT 292 276 271 285   No results for input(s): CKTOTAL, CKMB, CKMBINDEX, TROPONINI in the last 168 hours. No results for input(s): LABPROT, INR in the last 72 hours. No results for input(s): COLORURINE, LABSPEC, [place], GLUCOSEU, HGBUR, BILIRUBINUR, KETONESUR, PROTEINUR, UROBILINOGEN, NITRITE, LEUKOCYTESUR in the last 72 hours.  Invalid input(s): APPERANCEUR     Component Value Date/Time   CHOL 173 05/07/2017 0500   TRIG 215 (H) 05/07/2017 0500   HDL 61 05/07/2017 0500   CHOLHDL 2.8 05/07/2017 0500   VLDL 43 (H) 05/07/2017 0500    LDLCALC 69 05/07/2017 0500   Lab Results  Component Value Date   HGBA1C 9.1 (H) 05/07/2017      Component Value Date/Time   LABOPIA NONE DETECTED 05/06/2017 1011   COCAINSCRNUR NONE DETECTED 05/06/2017 1011   LABBENZ NONE DETECTED 05/06/2017 1011   AMPHETMU NONE DETECTED 05/06/2017 1011   THCU NONE DETECTED 05/06/2017 1011   LABBARB NONE DETECTED 05/06/2017 1011    No results for input(s): ETH in the last 168 hours.  I have personally reviewed the radiological images below and agree with the radiology interpretations.  Ct Head Wo Contrast  Result Date: 05/18/2017 CLINICAL DATA:  77 y/o  M; intracranial hemorrhage for follow-up. EXAM: CT HEAD WITHOUT CONTRAST TECHNIQUE: Contiguous axial images were obtained from the base of the skull through the vertex without intravenous contrast. COMPARISON:  05/17/2017 CT head FINDINGS: Brain: Stable hemorrhage centered in left basal ganglia measuring 4.3 x 2.7 x 3.2 cm (volume = 19 cm^3). Stable surrounding edema extending into the left frontal lobe, left temporal lobe, left insula, and left midbrain with associated local mass effect resulting in partial effacement of left lateral ventricle and 4 mm of left-to-right midline shift. Stable additional subcentimeter focus of hemorrhage within the left postcentral gyrus (series 3, image 28).  No new intracranial hemorrhage, focal mass effect, or stroke identified. No extra-axial collection or effacement of basilar cisterns. Stable ventricle size. Vascular: Calcific atherosclerosis of carotid siphons. No hyperdense vessel identified. Skull: Normal. Negative for fracture or focal lesion. Sinuses/Orbits: No acute finding. Other: None. IMPRESSION: 1. Stable hematoma in left basal ganglia, 19 cc. Stable surrounding edema and associated mass effect with 4 mm left-to-right midline shift and partial effacement of left lateral ventricle. Stable subcentimeter focus of hemorrhage in left postcentral gyrus. 2. No new acute  intracranial abnormality identified. Electronically Signed   By: Kristine Garbe M.D.   On: 05/18/2017 05:49   Chest Port 1 View  Result Date: 05/18/2017 CLINICAL DATA:  Pneumonia. EXAM: PORTABLE CHEST 1 VIEW COMPARISON:  One-view chest x-ray 05/16/2017 FINDINGS: Superior segment left lower lobe or left upper lobe airspace disease has increased since the prior exam. No significant right-sided consolidation is present. Mild hilar prominence is noted. A right IJ Port-A-Cath is stable. A left pleural effusion is suspected. IMPRESSION: 1. Progressive superior segment left lower lobe or left upper lobe airspace disease compatible with pneumonia. 2. Small left pleural effusion and basilar airspace disease, likely atelectasis. Electronically Signed   By: San Morelle M.D.   On: 05/18/2017 09:20    PHYSICAL EXAM  Temp:  [98.1 F (36.7 C)-98.7 F (37.1 C)] 98.2 F (36.8 C) (03/13 0930) Pulse Rate:  [70-77] 77 (03/13 1500) Resp:  [16-20] 16 (03/13 0930) BP: (129-148)/(59-67) 148/67 (03/13 1500) SpO2:  [97 %-99 %] 97 % (03/13 1500)  General - well nourished, well developed, in no apparent distress.    Ophthalmologic - fundi not visualized due to noncooperation.    Cardiovascular - regular rate and rhythm  Neuro -  Eyes open, awake, lethargic, only able to tell me his last name and not other orientation questions. Very limited sound output, anomia, but able to repeat simple sentences with moderate to severe dysarthria. Does not following commands but able to pantomime gestures. PERRL, EOMI, blinking to visual threat bilaterally, right facial droop, tongue midline in mouth. BUE 4/5 proximal and 3/5 distally, BLE 3/5 proximal and 3+/5 distally. DTR 1+ and no babinski. Sensation, coordination and gait not tested.    ASSESSMENT/PLAN Mr. Capri Raben is a 77 y.o. 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 MRI/MRA brain showed restored patency of L-ICA and L-MCA, mild cytotoxic edema and left basal ganglia infarct, small infarct in left amygdala and small number of scatterd tiny cortical infarcts in L-MCA territory. He had problems with hypotension after ICA stenting was put on oral midodrine and florinef for BP support. TEE  Unremarkable and loop recorder recommended to rule out A fib. Pt admitted to CIR on 05/11/17 and has been doing well with rehab for the last week. However, had fever 101 yesterday, put on levaquin. Today pt lethargic with AMS, essentially non verbal, CT stat which showed hemorrhagic conversion of left BG infarct with 56mm midline shift.   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 Yes; Fluid consistency: Thin   aspirin 81 mg daily prior to admission, was on aspirin 325 mg daily and integrelin. ASA and plavix on hold after hemorrhagic conversion, now restart ASA given  stable hematoma  Ongoing aggressive stroke risk factor management  Therapy recommendations:  CIR   Disposition:  Pending  Small cell lung cancer   11/2016 brain MRI no brain metastasis  On chemo and radiation  Follows with [facility]  Discussed with Dr. Rogue Bussing, pt primary oncologist, no concern of hypercoagulable state at this time  Diabetes  HgbA1c 9.1 goal < 7.0  Glucose stable  CBG monitoring  SSI  DM coordinator consult   Hypertension with hypotension after stent  Was on florinef and midodrine  Now off florinef and midodrine   BP stable  Close monitoring  Hyperlipidemia  Home meds:  none   LDL 69, goal < 70  Statin allergy listed in file  CKD  Cre 1.98->1.80->1.88->1.82->1.88  At baseline   On gentle IVF  Dysphagia   Passed swallow  Decrease IVF  Speech to follow   Other Stroke Risk Factors  Advanced age  Former smoker - quit 13 years ago  Other Active Problems  Anemia due to CKD - Hb 11.3->12.0->10.1->10.5  Hospital day # 2  Rosalin Hawking, MD PhD Stroke Neurology 05/19/2017 7:00 PM    To contact Stroke Continuity provider, please refer to [email or web address] After hours, contact General Neurology

## 2017-05-19 NOTE — Progress Notes (Signed)
Inpatient Rehabilitation  Discussed case with Dr. Erlinda Hong, hopeful for an IP Rehab admission tomorrow pending his medical clearance.  Notified nurse case manger.  Call if questions.   Carmelia Roller., CCC/SLP Admission Coordinator  [place]  Cell [phone]

## 2017-05-19 NOTE — Progress Notes (Signed)
Occupational Therapy Treatment Patient Details Name: Edwin Jones MRN: [id number] DOB: [birth year] Today's Date: 05/19/2017    History of present illness Edwin Jones is a 77 y.o. 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 infarct to the left BG, left amygdala and small scattered tiny cortical infarcts throughout the left MCA territory. Who progressed and went to CIR 3/5. Returned to ICU 3/11 with increased lethargy with hemorrhagic conversion of left basal ganglia infarct   OT comments  Pt agreeable to sit EOB, although fagitued. Pt sat EOB x 18 minutes for reaching tasks with R UE, requiring multimodal cues to initiate with R UE. Pt with R negelct/inattention and required verbal and physical cues to cross midline from L to R reaching for and handing off items/objects to OT. Pt and wife frustrated that pt having difficulty with activities with OT but thankful for the session. OT will continue to follow acutely  Follow Up Recommendations  CIR;Supervision/Assistance - 24 hour    Equipment Recommendations  Other (comment)(TBD at next venue of care)    Recommendations for Other Services      Precautions / Restrictions Precautions Precautions: Fall Precaution Comments: aphasic Restrictions Weight Bearing Restrictions: No       Mobility Bed Mobility Overal bed mobility: Needs Assistance Bed Mobility: Supine to Sit;Sit to Supine     Supine to sit: Min guard;HOB elevated Sit to supine: Min assist   General bed mobility comments: guarding for safety, HOB 30 degrees, increased time and use of rail , min A with LE s back onto bed, pt able to scoot to [facility] using rails  Transfers                      Balance Overall balance assessment: Needs assistance Sitting-balance support: Feet supported;No upper extremity supported   Sitting  balance - Comments: min posterior leaning during reaching acvitvity, min verbal cues and assist to sit self correct                                   ADL either performed or assessed with clinical judgement   ADL                                               Vision Baseline Vision/History: Wears glasses Wears Glasses: Distance only;Reading only Patient Visual Report: No change from baseline Tracking/Visual Pursuits: Impaired - to be further tested in functional context;Requires cues, head turns, or add eye shifts to track;Decreased smoothness of eye movement to RIGHT inferior field;Decreased smoothness of eye movement to RIGHT superior field Depth Perception: Undershoots   Perception     Praxis      Cognition Arousal/Alertness: Awake/alert Behavior During Therapy: Flat affect Overall Cognitive Status: Impaired/Different from baseline Area of Impairment: Following commands;Safety/judgement;Problem solving;Attention;Memory;Awareness                     Memory: Decreased short-term memory Following Commands: Follows one step commands with increased time Safety/Judgement: Decreased awareness of deficits;Decreased awareness of safety   Problem Solving: Slow processing;Requires verbal cues;Difficulty sequencing General Comments: Slow processing and required inreased time throughout session. pt smiling and nodding with mostly  yes and no answers. Pt incontinent of stool on standing and instead of attempting to gesture for commode reached with hand to catch stool. with cues needed for safety and how to manage self, lines and cleaning        Exercises Other Exercises Other Exercises: Pt sat EOB x 18 minutes for reaching tasks with R UE, requiring multimodal cues to initiate with R UE. Pt with R negelct/inattention and required verbal and physical cues to cross midline from L to R reaching for and handing off items/objects to OT   Shoulder  Instructions       General Comments      Pertinent Vitals/ Pain       Pain Assessment: No/denies pain Faces Pain Scale: No hurt  Home Living                                          Prior Functioning/Environment              Frequency  Min 2X/week        Progress Toward Goals  OT Goals(current goals can now be found in the care plan section)  Progress towards OT goals: OT to reassess next treatment     Plan Discharge plan remains appropriate    Co-evaluation                 AM-PAC PT "6 Clicks" Daily Activity     Outcome Measure   Help from another person eating meals?: None Help from another person taking care of personal grooming?: A Little Help from another person toileting, which includes using toliet, bedpan, or urinal?: A Little Help from another person bathing (including washing, rinsing, drying)?: A Little Help from another person to put on and taking off regular upper body clothing?: A Little Help from another person to put on and taking off regular lower body clothing?: A Little 6 Click Score: 19    End of Session    OT Visit Diagnosis: Unsteadiness on feet (R26.81);Other abnormalities of gait and mobility (R26.89);Muscle weakness (generalized) (M62.81);Other symptoms and signs involving cognitive function;Hemiplegia and hemiparesis Hemiplegia - Right/Left: Right Hemiplegia - dominant/non-dominant: Dominant Hemiplegia - caused by: Cerebral infarction   Activity Tolerance Patient tolerated treatment well;Patient limited by fatigue   Patient Left with call bell/phone within reach;with family/visitor present;in bed;with bed alarm set   Nurse Communication      Functional Assessment Tool Used: AM-PAC 6 Clicks Daily Activity   Time: 2197-5883 OT Time Calculation (min): 29 min  Charges: OT G-codes **NOT FOR INPATIENT CLASS** Functional Assessment Tool Used: AM-PAC 6 Clicks Daily Activity OT General Charges $OT Visit:  1 Visit OT Treatments $Therapeutic Activity: 8-22 mins $Neuromuscular Re-education: 8-22 mins     Britt Bottom 05/19/2017, 2:39 PM

## 2017-05-19 NOTE — Progress Notes (Signed)
  Speech Language Pathology Treatment: Cognitive-Linquistic  Patient Details Name: Edwin Jones MRN: [id number] DOB: [birth year] Today's Date: 05/19/2017 Time: 8891-6945 SLP Time Calculation (min) (ACUTE ONLY): 20 min  Assessment / Plan / Recommendation Clinical Impression  Session focused on aphasia: pt able to state name when asked; named common objects in room with 70% accuracy, required cloze phrase with initial word phoneme to achieve success on 30% of target words. Recited DOW, months of year, and counted from 1-10 with initial modeling/unision and fading to model with no sound from clinician. Pt answering yes/no questions about comfort with head nod/shake; initiating/acting on environment independently.  Continue SLP for aphasia/dysphagia.   HPI HPI: Edwin Jones is a 77 y/o male who presented by EMS as code stroke. The history is provided by the patient and the EMS personnel. The history is limited by the condition of the patient (Aphasia). He has history of diabetes, hypertension, hyperlipidemia, COPD, small cell lung cancer and comes in with right-sided weakness. He was last known normal at 1 AM when he walked to go to bed. He woke up at about 2 AM with right-sided weakness and unable to walk. He denies any headache. EMS transported him here as a code stroke. Of note, he just completed prophylactic cranial radiation. His lung cancer has been treated with chemotherapy and radiation treatment, both of which have been completed. 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 Arrangements are made for interventional radiology to treat and MRI revealed restored patency of the Left ICA, terminus, and Left MCA since earlier today. Associated core infarct primarily confined to the Left Basal Ganglia. There is a small 9 mm infarct in the left amygdala, and there are small number of scattered tiny cortical infarcts elsewhere in the Left MCA territory. Mild cytotoxic edema with no  associated hemorrhage or mass effect and Mild posterior and right anterior circulation atherosclerosis, stable from the CTA earlier today.      SLP Plan  Continue with current plan of care       Recommendations  Diet recommendations: Dysphagia 1 (puree);Thin liquid                Oral Care Recommendations: Oral care BID Follow up Recommendations: Inpatient Rehab SLP Visit Diagnosis: Aphasia (R47.01) Plan: Continue with current plan of care       GO                Juan Quam Laurice 05/19/2017, 10:39 AM

## 2017-05-20 ENCOUNTER — Encounter (HOSPITAL_COMMUNITY): Payer: Self-pay

## 2017-05-20 ENCOUNTER — Inpatient Hospital Stay (HOSPITAL_COMMUNITY): Payer: Medicare Other

## 2017-05-20 ENCOUNTER — Inpatient Hospital Stay (HOSPITAL_COMMUNITY)
Admission: RE | Admit: 2017-05-20 | Discharge: 2017-06-15 | DRG: 056 | Disposition: A | Discharge status: Home Health | Payer: Medicare Other | Source: Intra-hospital | Attending: Physical Medicine & Rehabilitation | Admitting: Physical Medicine & Rehabilitation

## 2017-05-20 ENCOUNTER — Other Ambulatory Visit: Payer: Self-pay

## 2017-05-20 DIAGNOSIS — I69320 Aphasia following cerebral infarction: Secondary | ICD-10-CM

## 2017-05-20 DIAGNOSIS — R1310 Dysphagia, unspecified: Secondary | ICD-10-CM | POA: Diagnosis present

## 2017-05-20 DIAGNOSIS — Z87891 Personal history of nicotine dependence: Secondary | ICD-10-CM

## 2017-05-20 DIAGNOSIS — B370 Candidal stomatitis: Secondary | ICD-10-CM | POA: Diagnosis present

## 2017-05-20 DIAGNOSIS — J440 Chronic obstructive pulmonary disease with acute lower respiratory infection: Secondary | ICD-10-CM | POA: Diagnosis present

## 2017-05-20 DIAGNOSIS — I619 Nontraumatic intracerebral hemorrhage, unspecified: Secondary | ICD-10-CM | POA: Diagnosis not present

## 2017-05-20 DIAGNOSIS — I69351 Hemiplegia and hemiparesis following cerebral infarction affecting right dominant side: Principal | ICD-10-CM

## 2017-05-20 DIAGNOSIS — C3412 Malignant neoplasm of upper lobe, left bronchus or lung: Secondary | ICD-10-CM | POA: Diagnosis not present

## 2017-05-20 DIAGNOSIS — E1122 Type 2 diabetes mellitus with diabetic chronic kidney disease: Secondary | ICD-10-CM | POA: Diagnosis present

## 2017-05-20 DIAGNOSIS — Z91013 Allergy to seafood: Secondary | ICD-10-CM | POA: Diagnosis not present

## 2017-05-20 DIAGNOSIS — K219 Gastro-esophageal reflux disease without esophagitis: Secondary | ICD-10-CM | POA: Diagnosis present

## 2017-05-20 DIAGNOSIS — R0602 Shortness of breath: Secondary | ICD-10-CM

## 2017-05-20 DIAGNOSIS — N189 Chronic kidney disease, unspecified: Secondary | ICD-10-CM | POA: Diagnosis present

## 2017-05-20 DIAGNOSIS — I129 Hypertensive chronic kidney disease with stage 1 through stage 4 chronic kidney disease, or unspecified chronic kidney disease: Secondary | ICD-10-CM | POA: Diagnosis present

## 2017-05-20 DIAGNOSIS — D62 Acute posthemorrhagic anemia: Secondary | ICD-10-CM | POA: Diagnosis present

## 2017-05-20 DIAGNOSIS — Z79899 Other long term (current) drug therapy: Secondary | ICD-10-CM

## 2017-05-20 DIAGNOSIS — Z888 Allergy status to other drugs, medicaments and biological substances status: Secondary | ICD-10-CM | POA: Diagnosis not present

## 2017-05-20 DIAGNOSIS — J181 Lobar pneumonia, unspecified organism: Secondary | ICD-10-CM | POA: Diagnosis present

## 2017-05-20 DIAGNOSIS — Z794 Long term (current) use of insulin: Secondary | ICD-10-CM

## 2017-05-20 DIAGNOSIS — Z91041 Radiographic dye allergy status: Secondary | ICD-10-CM

## 2017-05-20 DIAGNOSIS — E1165 Type 2 diabetes mellitus with hyperglycemia: Secondary | ICD-10-CM | POA: Diagnosis not present

## 2017-05-20 DIAGNOSIS — Z881 Allergy status to other antibiotic agents status: Secondary | ICD-10-CM | POA: Diagnosis not present

## 2017-05-20 DIAGNOSIS — R4182 Altered mental status, unspecified: Secondary | ICD-10-CM

## 2017-05-20 DIAGNOSIS — Z880 Allergy status to penicillin: Secondary | ICD-10-CM

## 2017-05-20 DIAGNOSIS — C3490 Malignant neoplasm of unspecified part of unspecified bronchus or lung: Secondary | ICD-10-CM

## 2017-05-20 DIAGNOSIS — I63512 Cerebral infarction due to unspecified occlusion or stenosis of left middle cerebral artery: Secondary | ICD-10-CM

## 2017-05-20 DIAGNOSIS — E119 Type 2 diabetes mellitus without complications: Secondary | ICD-10-CM

## 2017-05-20 LAB — BASIC METABOLIC PANEL
ANION GAP: 9 (ref 5–15)
BUN: 15 mg/dL (ref 6–20)
CHLORIDE: 104 mmol/L (ref 101–111)
CO2: 22 mmol/L (ref 22–32)
Calcium: 8.6 mg/dL — ABNORMAL LOW (ref 8.9–10.3)
Creatinine, Ser: 1.87 mg/dL — ABNORMAL HIGH (ref 0.61–1.24)
GFR calc Af Amer: 38 mL/min — ABNORMAL LOW (ref >60)
GFR calc non Af Amer: 33 mL/min — ABNORMAL LOW (ref >60)
Glucose, Bld: 171 mg/dL — ABNORMAL HIGH (ref 65–99)
POTASSIUM: 4.3 mmol/L (ref 3.5–5.1)
SODIUM: 135 mmol/L (ref 135–145)

## 2017-05-20 LAB — GLUCOSE, CAPILLARY
GLUCOSE-CAPILLARY: 190 mg/dL — AB (ref 65–99)
GLUCOSE-CAPILLARY: 195 mg/dL — AB (ref 65–99)
Glucose-Capillary: 170 mg/dL — ABNORMAL HIGH (ref 65–99)
Glucose-Capillary: 171 mg/dL — ABNORMAL HIGH (ref 65–99)
Glucose-Capillary: 183 mg/dL — ABNORMAL HIGH (ref 65–99)

## 2017-05-20 LAB — CBC
HEMATOCRIT: 32.2 % — AB (ref 39.0–52.0)
HEMOGLOBIN: 10.2 g/dL — AB (ref 13.0–17.0)
MCH: 30.1 pg (ref 26.0–34.0)
MCHC: 31.7 g/dL (ref 30.0–36.0)
MCV: 95 fL (ref 78.0–100.0)
Platelets: 290 10*3/uL (ref 150–400)
RBC: 3.39 MIL/uL — ABNORMAL LOW (ref 4.22–5.81)
RDW: 13 % (ref 11.5–15.5)
WBC: 5.4 10*3/uL (ref 4.0–10.5)

## 2017-05-20 MED ORDER — ALBUTEROL SULFATE (2.5 MG/3ML) 0.083% IN NEBU
3.0000 mL | INHALATION_SOLUTION | Freq: Four times a day (QID) | RESPIRATORY_TRACT | Status: DC | PRN
Start: 2017-05-20 — End: 2017-06-15

## 2017-05-20 MED ORDER — SODIUM CHLORIDE 0.9% FLUSH
10.0000 mL | INTRAVENOUS | Status: DC | PRN
Start: 2017-05-20 — End: 2017-05-21

## 2017-05-20 MED ORDER — NYSTATIN 100000 UNIT/ML MT SUSP
5.0000 mL | Freq: Four times a day (QID) | OROMUCOSAL | Status: DC
  Administered 2017-05-20 – 2017-06-15 (×101): 500000 [IU] via ORAL
  Filled 2017-05-20 (×101): qty 5

## 2017-05-20 MED ORDER — CHLORHEXIDINE GLUCONATE 0.12 % MT SOLN
15.0000 mL | Freq: Two times a day (BID) | OROMUCOSAL | Status: DC
  Administered 2017-05-20 – 2017-05-24 (×8): 15 mL via OROMUCOSAL
  Filled 2017-05-20 (×8): qty 15

## 2017-05-20 MED ORDER — GUAIFENESIN-DM 100-10 MG/5ML PO SYRP: 5.0000 mL | ORAL_SOLUTION | Freq: Four times a day (QID) | ORAL | Status: DC | PRN

## 2017-05-20 MED ORDER — PROCHLORPERAZINE 25 MG RE SUPP: 12.5000 mg | Freq: Four times a day (QID) | RECTAL | Status: DC | PRN

## 2017-05-20 MED ORDER — PROCHLORPERAZINE MALEATE 5 MG PO TABS
5.0000 mg | ORAL_TABLET | Freq: Four times a day (QID) | ORAL | Status: DC | PRN
  Administered 2017-06-05: 10 mg via ORAL
  Filled 2017-05-20: qty 2

## 2017-05-20 MED ORDER — INSULIN ASPART 100 UNIT/ML ~~LOC~~ SOLN
0.0000 [IU] | Freq: Every day | SUBCUTANEOUS | Status: DC
  Administered 2017-05-24 – 2017-05-30 (×5): 2 [IU] via SUBCUTANEOUS
  Administered 2017-05-31: 4 [IU] via SUBCUTANEOUS
  Administered 2017-06-01: 3 [IU] via SUBCUTANEOUS
  Administered 2017-06-02 – 2017-06-06 (×3): 2 [IU] via SUBCUTANEOUS
  Administered 2017-06-07 – 2017-06-08 (×2): 3 [IU] via SUBCUTANEOUS
  Administered 2017-06-09 – 2017-06-11 (×3): 2 [IU] via SUBCUTANEOUS
  Administered 2017-06-14: 3 [IU] via SUBCUTANEOUS

## 2017-05-20 MED ORDER — HEPARIN SODIUM (PORCINE) 5000 UNIT/ML IJ SOLN
5000.0000 [IU] | Freq: Three times a day (TID) | INTRAMUSCULAR | Status: DC
  Administered 2017-05-20 – 2017-06-15 (×77): 5000 [IU] via SUBCUTANEOUS
  Filled 2017-05-20 (×77): qty 1

## 2017-05-20 MED ORDER — PREMIER PROTEIN SHAKE
11.0000 [oz_av] | Freq: Four times a day (QID) | ORAL | Status: DC
  Administered 2017-05-20 – 2017-06-14 (×73): 11 [oz_av] via ORAL
  Filled 2017-05-20 (×138): qty 325.31

## 2017-05-20 MED ORDER — FLEET ENEMA 7-19 GM/118ML RE ENEM
1.0000 | ENEMA | Freq: Once | RECTAL | Status: DC | PRN
  Filled 2017-05-20: qty 1

## 2017-05-20 MED ORDER — SODIUM CHLORIDE 0.9% FLUSH
10.0000 mL | Freq: Two times a day (BID) | INTRAVENOUS | Status: DC
  Administered 2017-05-21: 10 mL

## 2017-05-20 MED ORDER — ALUM & MAG HYDROXIDE-SIMETH 200-200-20 MG/5ML PO SUSP: 30.0000 mL | ORAL | Status: DC | PRN

## 2017-05-20 MED ORDER — INSULIN ASPART 100 UNIT/ML ~~LOC~~ SOLN: 0.0000 [IU] | Freq: Four times a day (QID) | SUBCUTANEOUS | Status: DC

## 2017-05-20 MED ORDER — INSULIN ASPART 100 UNIT/ML ~~LOC~~ SOLN
0.0000 [IU] | Freq: Three times a day (TID) | SUBCUTANEOUS | Status: DC
  Administered 2017-05-20: 3 [IU] via SUBCUTANEOUS
  Administered 2017-05-21 (×2): 5 [IU] via SUBCUTANEOUS
  Administered 2017-05-21 – 2017-05-22 (×3): 3 [IU] via SUBCUTANEOUS
  Administered 2017-05-22 – 2017-05-23 (×2): 5 [IU] via SUBCUTANEOUS
  Administered 2017-05-23: 2 [IU] via SUBCUTANEOUS
  Administered 2017-05-23 – 2017-05-24 (×2): 5 [IU] via SUBCUTANEOUS
  Administered 2017-05-24: 3 [IU] via SUBCUTANEOUS
  Administered 2017-05-24: 5 [IU] via SUBCUTANEOUS
  Administered 2017-05-25: 3 [IU] via SUBCUTANEOUS
  Administered 2017-05-25: 2 [IU] via SUBCUTANEOUS
  Administered 2017-05-25 – 2017-05-26 (×2): 5 [IU] via SUBCUTANEOUS
  Administered 2017-05-26: 3 [IU] via SUBCUTANEOUS
  Administered 2017-05-26: 5 [IU] via SUBCUTANEOUS
  Administered 2017-05-27: 3 [IU] via SUBCUTANEOUS
  Administered 2017-05-27: 5 [IU] via SUBCUTANEOUS
  Administered 2017-05-27: 3 [IU] via SUBCUTANEOUS
  Administered 2017-05-28: 5 [IU] via SUBCUTANEOUS
  Administered 2017-05-28: 3 [IU] via SUBCUTANEOUS
  Administered 2017-05-28: 5 [IU] via SUBCUTANEOUS
  Administered 2017-05-29: 3 [IU] via SUBCUTANEOUS
  Administered 2017-05-29: 5 [IU] via SUBCUTANEOUS
  Administered 2017-05-29: 3 [IU] via SUBCUTANEOUS
  Administered 2017-05-30: 5 [IU] via SUBCUTANEOUS
  Administered 2017-05-30: 11 [IU] via SUBCUTANEOUS
  Administered 2017-05-30: 3 [IU] via SUBCUTANEOUS
  Administered 2017-05-31: 5 [IU] via SUBCUTANEOUS
  Administered 2017-05-31 (×2): 3 [IU] via SUBCUTANEOUS
  Administered 2017-06-01: 5 [IU] via SUBCUTANEOUS
  Administered 2017-06-01: 3 [IU] via SUBCUTANEOUS
  Administered 2017-06-01: 8 [IU] via SUBCUTANEOUS
  Administered 2017-06-02: 3 [IU] via SUBCUTANEOUS
  Administered 2017-06-02 (×2): 5 [IU] via SUBCUTANEOUS
  Administered 2017-06-03 (×2): 3 [IU] via SUBCUTANEOUS
  Administered 2017-06-03: 2 [IU] via SUBCUTANEOUS
  Administered 2017-06-04 (×2): 3 [IU] via SUBCUTANEOUS
  Administered 2017-06-04: 5 [IU] via SUBCUTANEOUS
  Administered 2017-06-05 – 2017-06-06 (×5): 3 [IU] via SUBCUTANEOUS
  Administered 2017-06-07: 8 [IU] via SUBCUTANEOUS
  Administered 2017-06-07 (×2): 3 [IU] via SUBCUTANEOUS
  Administered 2017-06-08 (×2): 5 [IU] via SUBCUTANEOUS
  Administered 2017-06-08 – 2017-06-09 (×3): 3 [IU] via SUBCUTANEOUS
  Administered 2017-06-09: 5 [IU] via SUBCUTANEOUS
  Administered 2017-06-10: 3 [IU] via SUBCUTANEOUS
  Administered 2017-06-10: 2 [IU] via SUBCUTANEOUS
  Administered 2017-06-10 – 2017-06-12 (×5): 3 [IU] via SUBCUTANEOUS
  Administered 2017-06-12: 5 [IU] via SUBCUTANEOUS
  Administered 2017-06-12: 3 [IU] via SUBCUTANEOUS
  Administered 2017-06-13: 2 [IU] via SUBCUTANEOUS
  Administered 2017-06-13: 5 [IU] via SUBCUTANEOUS
  Administered 2017-06-13: 2 [IU] via SUBCUTANEOUS
  Administered 2017-06-14: 11 [IU] via SUBCUTANEOUS
  Administered 2017-06-14: 2 [IU] via SUBCUTANEOUS
  Administered 2017-06-14: 3 [IU] via SUBCUTANEOUS

## 2017-05-20 MED ORDER — PANTOPRAZOLE SODIUM 40 MG PO TBEC
40.0000 mg | DELAYED_RELEASE_TABLET | Freq: Every day | ORAL | Status: DC
  Filled 2017-05-20: qty 1

## 2017-05-20 MED ORDER — POLYETHYLENE GLYCOL 3350 17 G PO PACK
17.0000 g | PACK | Freq: Every day | ORAL | Status: DC | PRN
  Administered 2017-05-21 – 2017-06-10 (×4): 17 g via ORAL
  Filled 2017-05-20 (×5): qty 1

## 2017-05-20 MED ORDER — LEVOFLOXACIN 500 MG PO TABS
500.0000 mg | ORAL_TABLET | Freq: Every day | ORAL | Status: AC
  Administered 2017-05-21 – 2017-05-22 (×2): 500 mg via ORAL
  Filled 2017-05-20 (×2): qty 1

## 2017-05-20 MED ORDER — ACETAMINOPHEN 325 MG PO TABS: 325.0000 mg | ORAL_TABLET | ORAL | Status: DC | PRN

## 2017-05-20 MED ORDER — PROCHLORPERAZINE EDISYLATE 5 MG/ML IJ SOLN: 5.0000 mg | Freq: Four times a day (QID) | INTRAMUSCULAR | Status: DC | PRN

## 2017-05-20 MED ORDER — BISACODYL 10 MG RE SUPP
10.0000 mg | Freq: Every day | RECTAL | Status: DC | PRN
  Administered 2017-05-22 – 2017-06-14 (×4): 10 mg via RECTAL
  Filled 2017-05-20 (×4): qty 1

## 2017-05-20 MED ORDER — BISACODYL 10 MG RE SUPP: 10.0000 mg | Freq: Every day | RECTAL | Status: DC | PRN

## 2017-05-20 MED ORDER — ASPIRIN EC 325 MG PO TBEC
325.0000 mg | DELAYED_RELEASE_TABLET | Freq: Every day | ORAL | Status: DC
  Administered 2017-05-21: 325 mg via ORAL
  Filled 2017-05-20: qty 1

## 2017-05-20 MED ORDER — TRAZODONE HCL 50 MG PO TABS: 25.0000 mg | ORAL_TABLET | Freq: Every evening | ORAL | Status: DC | PRN

## 2017-05-20 MED ORDER — DIPHENHYDRAMINE HCL 12.5 MG/5ML PO ELIX: 12.5000 mg | ORAL_SOLUTION | Freq: Four times a day (QID) | ORAL | Status: DC | PRN

## 2017-05-20 NOTE — Progress Notes (Signed)
  Speech Language Pathology Treatment: Cognitive-Linquistic  Patient Details Name: Edwin Jones MRN: [id number] DOB: 1940-12-09 Today's Date: 05/20/2017 Time: 1103-1594 SLP Time Calculation (min) (ACUTE ONLY): 15 min  Assessment / Plan / Recommendation Clinical Impression  Skilled treatment session focused on aphasia. SLP facilitated session by providing Mod A phrase completion of target words to increase to 50%. Pt required Max A cues to verbalize answers to yes/no (instead of using head gestures). Pt able to accurately answer basic yes/no questions with good accuracy. When questioned about personal information (such as how many children he has) - he used gestures to indicate. Pt able to imitate response. Pt with increased vocal intensity but decreased vocal intensity and dysarthria continue to complicate communicative ability.    HPI HPI: Edwin Jones is a 77 y/o male who presented by EMS as code stroke. The history is provided by the patient and the EMS personnel. The history is limited by the condition of the patient (Aphasia). He has history of diabetes, hypertension, hyperlipidemia, COPD, small cell lung cancer and comes in with right-sided weakness. He was last known normal at 1 AM when he walked to go to bed. He woke up at about 2 AM with right-sided weakness and unable to walk. He denies any headache. EMS transported him here as a code stroke. Of note, he just completed prophylactic cranial radiation. His lung cancer has been treated with chemotherapy and radiation treatment, both of which have been completed. 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 Arrangements are made for interventional radiology to treat and MRI revealed restored patency of the Left ICA, terminus, and Left MCA since earlier today. Associated core infarct primarily confined to the Left Basal Ganglia. There is a small 9 mm infarct in the left amygdala, and there are small number of scattered tiny  cortical infarcts elsewhere in the Left MCA territory. Mild cytotoxic edema with no associated hemorrhage or mass effect and Mild posterior and right anterior circulation atherosclerosis, stable from the CTA earlier today.      SLP Plan  Continue with current plan of care       Recommendations  Diet recommendations: Dysphagia 1 (puree);Thin liquid Liquids provided via: Cup;Straw Medication Administration: Crushed with puree Supervision: Patient able to self feed Compensations: Minimize environmental distractions;Slow rate;Small sips/bites Postural Changes and/or Swallow Maneuvers: Seated upright 90 degrees                Oral Care Recommendations: Oral care BID Follow up Recommendations: Inpatient Rehab SLP Visit Diagnosis: Aphasia (R47.01) Plan: Continue with current plan of care       [place] 05/20/2017, 2:57 PM

## 2017-05-20 NOTE — Care Management Note (Signed)
Case Management Note  Patient Details  Name: Edwin Jones MRN: [id number] Date of Birth: 1941/02/28  Subjective/Objective:                    Action/Plan: Pt discharging to CIR today. CM signing off.  Expected Discharge Date:                  Expected Discharge Plan:  [place]  In-House Referral:     Discharge planning Services  CM Consult  Post Acute Care Choice:    Choice offered to:     DME Arranged:    DME Agency:     HH Arranged:    [place] Agency:     Status of Service:  Completed, signed off  If discussed at [employer] of Stay Meetings, dates discussed:    Additional Comments:  Pollie Friar, RN 05/20/2017, 2:47 PM

## 2017-05-20 NOTE — Progress Notes (Signed)
EEG complete - results pending 

## 2017-05-20 NOTE — H&P (Signed)
Physical Medicine and Rehabilitation Admission H&P     CC: Functional deficits due to stroke with hemorrhage.      Brief HPI:  Edwin Jones is an 77 y.o. 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 He did not receive TPA secondary to concerns of potential brain metastasis. CTA brain done revealing demonstrating left M1 occlusion and patient underwent mechanical thrombectomy and ICA stenting. Follow up MRI/MRA brain showed restored patency of L-ICA and L-MCA with left basal ganglia infarct, small infarct in left amygdala and small number of scatterd tiny cortical infarcts in L-MCA territory. He had hypotension post stenting requiring pressors and ultimately transitioned to oral midodrine and florinef for BP support.    Dr. Erlinda Hong Dr. Rogue Bussing felt that stroke likely cardioembolic and hHe was started on ASA/Plavix--loop recorder recommended to rule out A fib. He was admitted to CIR on 05/11/17 due to functinal deficits from left sided weakness, fluctuating bouts of lethargy, aphasia and dysphagia.  He developed fever of 101 on 3/10 with lethargy, was pan cultured and started on antibiotics due to concerns of PNA.  On 3/11, he was noted to have neurologic decline with worsening of cognition, non verbal state and difficulty with mobility.  CT head done revealing acute left basal ganglia with edema and regional mass effect. ASA/Plavix were discontinued and he was transferred to acute hospital for closer monitoring. Follow up CT head has been stable and lethargy resolving. He was downgraded to dysphagia 1, thin liquids. Wife reported head shaking while asleep on 3/13 and EEG shoed focal slowing over left temporal region ---no epileptiform discharges noted.     To start ASA today as hematoma stable--no concerns of hypercoagulable state per oncology.  Therapy ongoing and  CIR consulted to resume rehab course.        Review of Systems  Unable to perform ROS: Mental acuity  HENT: Negative for hearing loss.   Eyes: Negative for blurred vision and double vision.  Respiratory: Positive for cough (chronic for months --at baseline per wife) and shortness of breath (indicates with activity ).   Cardiovascular: Negative for chest pain.  Gastrointestinal: Positive for constipation. Negative for heartburn.  Musculoskeletal: Negative for myalgias.  Neurological: Positive for speech change, focal weakness and weakness. Negative for dizziness and headaches.  Psychiatric/Behavioral: Positive for memory loss.            Past Medical History:  Diagnosis Date  . Cancer ([place])    . COPD (chronic obstructive pulmonary disease) ([place])    . Diabetes mellitus without complication ([place])    . GERD without esophagitis    . Hepatitis C virus      history of hep C treated with Harvoni  . Hypercholesterolemia    . Hypertension    . Seasonal allergies             Past Surgical History:  Procedure Laterality Date  . COLONOSCOPY        approximately 11 years ago  . FLEXIBLE BRONCHOSCOPY N/A 11/05/2016    Procedure: FLEXIBLE BRONCHOSCOPY;  Surgeon: Wilhelmina Mcardle, MD;  Location: [facility] ORS;  Service: Pulmonary;  Laterality: N/A;  . IR FLUORO GUIDE CV LINE RIGHT   05/06/2017  . IR INTRAVSC STENT CERV CAROTID W/O EMB-PROT MOD SED INC ANGIO   05/06/2017  . IR PERCUTANEOUS ART THROMBECTOMY/INFUSION INTRACRANIAL INC DIAG  ANGIO   05/06/2017  . IR US GUIDE VASC ACCESS LEFT   05/06/2017  . IR US GUIDE VASC ACCESS RIGHT   05/06/2017  . IR US GUIDE VASC ACCESS RIGHT   05/06/2017  . PORTA CATH INSERTION N/A 11/23/2016    Procedure: Glori Luis Cath Insertion;  Surgeon: Algernon Huxley, MD;  Location: [facility];  Service: Cardiovascular;  Laterality: N/A;  . RADIOLOGY WITH ANESTHESIA N/A 05/06/2017    Procedure: IR WITH ANESTHESIA;  Surgeon: Radiologist, Medication, MD;  Location: [facility];   Service: Radiology;  Laterality: N/A;  . TEE WITHOUT CARDIOVERSION N/A 05/10/2017    Procedure: TRANSESOPHAGEAL ECHOCARDIOGRAM (TEE);  Surgeon: Sanda Klein, MD;  Location: [facility] ENDOSCOPY;  Service: Cardiovascular;  Laterality: N/A;  . TONSILLECTOMY               Family History  Problem Relation Age of Onset  . Skin cancer Mother    . Ovarian cancer Sister          sister was diagnosed as a teenager  . Throat cancer Brother 74        brother #1  . Lung cancer Sister 46  . Throat cancer Brother 31        brother #2  . Skin cancer Sister        Social History:  Married. Independent PTA. 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 He reports that he quit smoking 03/2004. His smoking use included cigarettes. He has a 50.00 pack-year smoking history. He has never used smokeless tobacco. He reports that he does not drink alcohol or use drugs.           Allergies  Allergen Reactions  . Penicillin G Anaphylaxis      Other reaction(s): Other (See Comments) Couldn't breath among other things Has patient had a PCN reaction causing immediate rash, facial/tongue/throat swelling, SOB or lightheadedness with hypotension: Yes Has patient had a PCN reaction causing severe rash involving mucus membranes or skin necrosis: No Has patient had a PCN reaction that required hospitalization: Yes Has patient had a PCN reaction occurring within the last 10 years: No If all of the above answers are "NO", then may proceed with Ce  . Shellfish-Derived Products Anaphylaxis  . Statins        Other reaction(s): Other (See Comments) Muscle spasms - can't walk - couldn't turn over in bed  . Erythromycin Itching      All mycins  . Tamsulosin        Other reaction(s): Dizziness  . Tuberculin Ppd        Other reaction(s): Other (See Comments) False test   . Iodinated Diagnostic Agents Hives            Medications Prior to Admission  Medication Sig Dispense Refill  . acyclovir ointment (ZOVIRAX) 5 %  Apply topically 3 (three) times daily.      . Chlorhexidine Gluconate Cloth 2 % PADS Apply 6 each topically daily.      . clopidogrel (PLAVIX) 75 MG tablet Take 1 tablet (75 mg total) by mouth daily.      . fludrocortisone (FLORINEF) 0.1 MG tablet Take 1 tablet (0.1 mg total) by mouth daily.      . heparin 5000 UNIT/ML injection Inject 1 mL (5,000 Units total) into the skin every 8 (eight) hours. 1 mL    . insulin aspart (NOVOLOG) 100 UNIT/ML injection Inject 0-15 Units into the skin 3 (three) times daily with  meals. 10 mL 11  . insulin aspart (NOVOLOG) 100 UNIT/ML injection Inject 4 Units into the skin 3 (three) times daily with meals. 10 mL 11  . insulin aspart (NOVOLOG) 100 UNIT/ML injection Inject 0-5 Units into the skin at bedtime. 10 mL 11  . midodrine (PROAMATINE) 5 MG tablet Take 1 tablet (5 mg total) by mouth 3 (three) times daily with meals.      . nystatin (MYCOSTATIN) 100000 UNIT/ML suspension Use as directed 5 mLs (500,000 Units total) in the mouth or throat 4 (four) times daily. 60 mL 0  . ondansetron (ZOFRAN) 4 MG/2ML SOLN injection Inject 2 mLs (4 mg total) into the vein every 6 (six) hours as needed for nausea or vomiting. 2 mL 0  . senna-docusate (SENOKOT-S) 8.6-50 MG tablet Take 1 tablet by mouth at bedtime as needed for mild constipation.          Drug Regimen Review  Drug regimen was reviewed and remains appropriate with no significant issues identified   Home: Home Living Family/patient expects to be discharged to:: Private residence Living Arrangements: Spouse/significant other Available Help at Discharge: Family, Available 24 hours/day Type of Home: House Home Access: Stairs to enter [employer] of Steps: 2 Entrance Stairs-Rails: Right Home Layout: One level Bathroom Shower/Tub: Chiropodist: Standard Home Equipment: None  Lives With: Spouse   Functional History: Prior Function Level of Independence: Independent Comments: pt was  indendent prior to admission in feb. Since then had progressed to minguard for ambulation and assist for ADLs   Functional Status:  Mobility: Bed Mobility Overal bed mobility: Needs Assistance Bed Mobility: Supine to Sit Supine to sit: Min assist Sit to supine: Min assist General bed mobility comments: Min assist at the end of the transition to keep him from falling to the right.  Transfers Overall transfer level: Needs assistance Equipment used: 1 person hand held assist Transfers: Sit to/from Stand, Stand Pivot Transfers Sit to Stand: Min assist, Mod assist Stand pivot transfers: Min assist General transfer comment: Min assist to stand from elevated bed, mod assist from lower chair.  Slow transitions and small, choppy steps with difficulty progressing his right leg over to take steps.  He is soft at his right knee, but never buckled.   Ambulation/Gait General Gait Details: would be safer to attempt with a second person.    ADL: ADL Overall ADL's : Needs assistance/impaired Eating/Feeding: Set up, Supervision/ safety, Sitting Grooming: Wash/dry hands, Set up, Sitting Upper Body Bathing: Minimal assistance, Sitting, Cueing for sequencing Lower Body Bathing: Minimal assistance, Sit to/from stand Upper Body Dressing : Minimal assistance, Sitting, Cueing for sequencing Upper Body Dressing Details (indicate cue type and reason): Requiring max cues for seuqencing and cognition to don gown Lower Body Dressing: Minimal assistance, Sit to/from stand Toilet Transfer: Minimal assistance, +2 for safety/equipment, BSC, Stand-pivot Toilet Transfer Details (indicate cue type and reason): Pt performing stand pivot to [facility] with Min A +2. Incontinant of stool.  Toileting- Water quality scientist and Hygiene: Sit to/from stand, Moderate assistance Toileting - Clothing Manipulation Details (indicate cue type and reason): Mod A for toilet hygiene after BM.  Functional mobility during ADLs: Min guard,  Rolling walker General ADL Comments: Pt demonstrating decreased fucntional performance. Pt with poor cognition, balance, and fucnitonal use of RUE.   Cognition: Cognition Overall Cognitive Status: Impaired/Different from baseline Arousal/Alertness: Awake/alert Orientation Level: Oriented X4 Attention: Sustained Sustained Attention: Impaired Sustained Attention Impairment: Verbal basic, Functional basic Selective Attention: Impaired Selective Attention Impairment: Verbal  basic, Functional basic Memory: (difficult to assess d/t aphasia) Cognition Arousal/Alertness: Awake/alert Behavior During Therapy: WFL for tasks assessed/performed(smiling today) Overall Cognitive Status: Impaired/Different from baseline Area of Impairment: Attention, Following commands, Problem solving Orientation Level: ("[place]" "November") Current Attention Level: Sustained Memory: Decreased short-term memory Following Commands: Follows one step commands with increased time Safety/Judgement: Decreased awareness of deficits Awareness: Emergent Problem Solving: Slow processing General Comments: Pt needs extra time to process information and commands, but once he does, he can follow them accurately.  Difficult to assess due to: Impaired communication      Blood pressure 136/69, pulse 73, temperature 98.1 F (36.7 C), temperature source Oral, resp. rate 18, height _0  (1.905 m), weight 94 kg (207 lb 3.7 oz), SpO2 96 %. Physical Exam  Nursing note and vitals reviewed. Constitutional: He appears well-developed and well-nourished. He appears lethargic. He is easily aroused.  HENT:  Head: Normocephalic and atraumatic.  Eyes: Conjunctivae and EOM are normal. Pupils are equal, round, and reactive to light.  Neck: Normal range of motion. Neck supple.  Cardiovascular: Normal rate and regular rhythm.  Respiratory: Effort normal and breath sounds normal. No stridor. No respiratory distress. He has no wheezes.  GI:  Soft. Bowel sounds are normal. He exhibits no distension. There is no tenderness.  Musculoskeletal: He exhibits no edema or tenderness.  Neurological: He is easily aroused. He appears lethargic. A cranial nerve deficit is present. Coordination abnormal.  Delayed processing with slow speech and limited verbal output.  His alertness improved as exam progressed. Speech limited to "I don't know" for most questions.  Oriented to self and place (with cues) he was able to state his name and wife's name. He was able to point to items. Right facial weakness. Left inattention.   Skin: Skin is warm and dry.   3/5 strength in the right deltoid bicep tricep grip hip flexor knee extensor ankle dorsiflexor 5/5 in the left deltoid bicep tricep grip 4/5 in the left hip flexor knee extensor ankle dorsiflexor Withdraws to pinch in all 4 extremities.  aphasia limits further sensory testing Tone appears normal, no evidence of  clonus or tremors   Lab Results Last 48 Hours        Results for orders placed or performed during the hospital encounter of 05/17/17 (from the past 48 hour(s))  Glucose, capillary     Status: Abnormal    Collection Time: 05/18/17  6:04 PM  Result Value Ref Range    Glucose-Capillary 156 (H) 65 - 99 mg/dL    Comment 1 Notify RN      Comment 2 Document in Chart    Glucose, capillary     Status: Abnormal    Collection Time: 05/18/17  9:23 PM  Result Value Ref Range    Glucose-Capillary 169 (H) 65 - 99 mg/dL    Comment 1 Notify RN      Comment 2 Document in Chart    Basic metabolic panel     Status: Abnormal    Collection Time: 05/19/17  5:56 AM  Result Value Ref Range    Sodium 135 135 - 145 mmol/L    Potassium 4.5 3.5 - 5.1 mmol/L    Chloride 105 101 - 111 mmol/L    CO2 21 (L) 22 - 32 mmol/L    Glucose, Bld 196 (H) 65 - 99 mg/dL    BUN 16 6 - 20 mg/dL    Creatinine, Ser 1.88 (H) 0.61 - 1.24 mg/dL    Calcium 8.7 (  L) 8.9 - 10.3 mg/dL    GFR calc non Af Amer 33 (L) >60 mL/min     GFR calc Af Amer 38 (L) >60 mL/min      Comment: (NOTE) The eGFR has been calculated using the CKD EPI equation. This calculation has not been validated in all clinical situations. eGFR's persistently <60 mL/min signify possible Chronic Kidney Disease.      Anion gap 9 5 - 15      Comment: Performed at [facility] [address], [place], [place] [address]  CBC     Status: Abnormal    Collection Time: 05/19/17  5:56 AM  Result Value Ref Range    WBC 5.5 4.0 - 10.5 K/uL    RBC 3.43 (L) 4.22 - 5.81 MIL/uL    Hemoglobin 10.5 (L) 13.0 - 17.0 g/dL    HCT 32.6 (L) 39.0 - 52.0 %    MCV 95.0 78.0 - 100.0 fL    MCH 30.6 26.0 - 34.0 pg    MCHC 32.2 30.0 - 36.0 g/dL    RDW 13.0 11.5 - 15.5 %    Platelets 285 150 - 400 K/uL      Comment: Performed at [facility], [place] [address] [place], [place] [address]  Glucose, capillary     Status: Abnormal    Collection Time: 05/19/17  6:18 AM  Result Value Ref Range    Glucose-Capillary 180 (H) 65 - 99 mg/dL    Comment 1 Notify RN      Comment 2 Document in Chart    Glucose, capillary     Status: Abnormal    Collection Time: 05/19/17  9:00 AM  Result Value Ref Range    Glucose-Capillary 196 (H) 65 - 99 mg/dL    Comment 1 Notify RN      Comment 2 Document in Chart    Glucose, capillary     Status: Abnormal    Collection Time: 05/19/17 12:24 PM  Result Value Ref Range    Glucose-Capillary 191 (H) 65 - 99 mg/dL  Glucose, capillary     Status: Abnormal    Collection Time: 05/19/17  5:23 PM  Result Value Ref Range    Glucose-Capillary 152 (H) 65 - 99 mg/dL  Glucose, capillary     Status: Abnormal    Collection Time: 05/19/17  8:25 PM  Result Value Ref Range    Glucose-Capillary 172 (H) 65 - 99 mg/dL    Comment 1 Notify RN      Comment 2 Document in Chart    Basic metabolic panel     Status: Abnormal    Collection Time: 05/20/17  4:31 AM  Result Value Ref Range    Sodium 135 135 - 145 mmol/L    Potassium 4.3 3.5 - 5.1 mmol/L     Chloride 104 101 - 111 mmol/L    CO2 22 22 - 32 mmol/L    Glucose, Bld 171 (H) 65 - 99 mg/dL    BUN 15 6 - 20 mg/dL    Creatinine, Ser 1.87 (H) 0.61 - 1.24 mg/dL    Calcium 8.6 (L) 8.9 - 10.3 mg/dL    GFR calc non Af Amer 33 (L) >60 mL/min    GFR calc Af Amer 38 (L) >60 mL/min      Comment: (NOTE) The eGFR has been calculated using the CKD EPI equation. This calculation has not been validated in all clinical situations. eGFR's persistently <60 mL/min signify possible Chronic  Kidney Disease.      Anion gap 9 5 - 15      Comment: Performed at [facility] [address], [place], [place] [address]  CBC     Status: Abnormal    Collection Time: 05/20/17  4:31 AM  Result Value Ref Range    WBC 5.4 4.0 - 10.5 K/uL    RBC 3.39 (L) 4.22 - 5.81 MIL/uL    Hemoglobin 10.2 (L) 13.0 - 17.0 g/dL    HCT 32.2 (L) 39.0 - 52.0 %    MCV 95.0 78.0 - 100.0 fL    MCH 30.1 26.0 - 34.0 pg    MCHC 31.7 30.0 - 36.0 g/dL    RDW 13.0 11.5 - 15.5 %    Platelets 290 150 - 400 K/uL      Comment: Performed at [facility], [place] [address] [place], [place] [address]  Glucose, capillary     Status: Abnormal    Collection Time: 05/20/17  8:44 AM  Result Value Ref Range    Glucose-Capillary 190 (H) 65 - 99 mg/dL    Comment 1 Notify RN      Comment 2 Document in Chart    Glucose, capillary     Status: Abnormal    Collection Time: 05/20/17 11:19 AM  Result Value Ref Range    Glucose-Capillary 170 (H) 65 - 99 mg/dL    Comment 1 Notify RN      Comment 2 Document in Chart        Imaging Results (Last 48 hours)  No results found.           Medical Problem List and Plan: 1.    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  DVT Prophylaxis/Anticoagulation: Pharmaceutical: Heparin--continue 3. Pain Management: tylenol prn 4. Mood: LCSW to follow for evaluation when appropriate.  5. Neuropsych: This patient is not capable of making decisions on his own behalf. 6.  Skin/Wound Care: routine pressure relief measures.  7. Fluids/Electrolytes/Nutrition: Monitor I/O. Continue to offer supplements between meals. 8. Hypertension:  Hypotension hs resolved and patient off Midodrine and Florinef 9. 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   Improving with hydration.  10. ABLA: Monitor H/H for signs of bleeding has been in 10- 11 range. Recheck in am. 11. LLL/LUL PNA:   Discontinue IVF. Continue Levaquin D# 5. Will check F/U CXR due to reports of SOB 12. Lethargy: May need ritalin to help with activation. Will need to monitor BP--SBP goal <140 13. Oral thrush: Will start nystatin mouthwash as well as oral care every 4 hours while awake.  14.  COPD/Left lung cancer:  On Breo with albuterol prn.      Post Admission Physician Evaluation: 1. Functional deficits secondary  to right hemiparesis due to left basal ganglia hemorrhagic infarct. 2. Patient is admitted to receive collaborative, interdisciplinary care between the physiatrist, rehab nursing staff, and therapy team. 3. Patient's level of medical complexity and substantial therapy needs in context of that medical necessity cannot be provided at a lesser intensity of care such as a SNF. 4. Patient has experienced substantial functional loss from his/her baseline which was documented above under the "Functional History" and "Functional Status" headings.  Judging by the patient's diagnosis, physical exam, and functional history, the patient has potential for functional progress which will result in measurable gains while on inpatient rehab.  These gains will be of substantial and practical use upon discharge  in facilitating  mobility and self-care at the household level. 5. Physiatrist will provide 24 hour management of medical needs as well as oversight of the therapy plan/treatment and provide guidance as appropriate regarding the interaction of the two. 6. The Preadmission Screening has been reviewed and patient  status is unchanged unless otherwise stated above. 7. 24 hour rehab nursing will assist with bladder management, bowel management, safety, skin/wound care, disease management, medication administration, pain management and patient education  and help integrate therapy concepts, techniques,education, etc. 8. PT will assess and treat for/with: pre gait, gait training, endurance , safety, equipment, neuromuscular re education.   Goals are: Sup. 9. OT will assess and treat for/with: ADLs, Cognitive perceptual skills, Neuromuscular re education, safety, endurance, equipment.   Goals are: Sup. Therapy may proceed with showering this patient. 10. SLP will assess and treat for/with: Language, cognition, swallowing.  Goals are: Modified independent for simple biographical information, modified independent for swallowing. 11. Case Management and Social Worker will assess and treat for psychological issues and discharge planning. 12. Team conference will be held weekly to assess progress toward goals and to determine barriers to discharge. 13. Patient will receive at least 3 hours of therapy per day at least 5 days per week. 14. ELOS: 7-10d       15. Prognosis:  fair         Charlett Blake M.D. [facility] [id number] (Sports Med, Neuromuscular Med) Diplomate [place] Board of [place], PA-C 05/20/2017

## 2017-05-20 NOTE — Procedures (Signed)
ELECTROENCEPHALOGRAM REPORT  Date of Study: 05/20/2017  Patient's Name: Edwin Jones MRN: [id number] Date of Birth: April 04, 1940  Referring Provider: Dr. Rosalin Hawking  Clinical History: This is a 77 year old man with altered mental status, s/p stroke  Medications: No sedating medication or AEDs listed  Technical Summary: A multichannel digital EEG recording measured by the international 10-20 system with electrodes applied with paste and impedances below 5000 ohms performed in our laboratory with EKG monitoring in an awake and asleep patient.  Hyperventilation was not performed. Photic stimulation was performed.  The digital EEG was referentially recorded, reformatted, and digitally filtered in a variety of bipolar and referential montages for optimal display.    Description: The patient is awake and asleep during the recording.  During maximal wakefulness, there is a symmetric, medium voltage 8 Hz posterior dominant rhythm that attenuates with eye opening.  There is focal polymorphic theta and delta slowing over the left temporal region. During drowsiness and stage I sleep, there is an increase in theta and delta slowing of the background with vertex waves seen.  Photic stimulation did not elicit any abnormalities.  There were no epileptiform discharges or electrographic seizures seen.    EKG lead was unremarkable.  Impression: This awake and asleep EEG is abnormal due to focal slowing over the left temporal region.  Clinical Correlation of the above findings indicates focal cerebral dysfunction over the left temporal region suggestive of underlying structural or physiologic abnormality. The absence of epileptiform discharges does not exclude a clinical diagnosis of epilepsy. Clinical correlation is advised.   Ellouise Newer, M.D.

## 2017-05-20 NOTE — Progress Notes (Signed)
PMR Admission Coordinator Pre-Admission Assessment  Patient: Edwin Jones is an 77 y.o., male MRN: [id number] DOB: 1940-10-28 Height: 6\' 3"  (190.5 cm) Weight: 94 kg (207 lb 3.7 oz)                                                                                                                                                  Insurance Information HMO:     PPO:      PCP:      IPA:      80/20:      OTHER:  PRIMARY: Medicare A & B      Policy#: [id number]      Subscriber: Self CM Name:       Phone#:      Fax#:  Pre-Cert#: Eligible       Employer: Retired Benefits:  Phone #: Verified online     Name: [employer]  Eff. Date: A:01/07/05 B:09/06/09     Deduct: $1364      Out of Pocket Max: N/A      Life Max: N/A CIR: 100%      SNF: 100% days 1-20; 80% days 21-100 Outpatient: 80%     Co-Pay: 20% Home Health: 100%      Co-Pay: $0 DME: 80%     Co-Pay: 20% Providers: Patient's Choice   SECONDARY: None       Medicaid Application Date:       Case Manager:  Disability Application Date:       Case Worker:   Emergency Contact Information        Contact Information    Name Relation Home Work Mobile   Stencil,[name] Spouse [phone]   Otho Ket   [phone]     Current Medical History  Patient Admitting Diagnosis: Left CVA now with hemorrhage conversion and 5 mm rightward midline shift.  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 No tPA given due to concern of brain metastasis.CTA showed a L ICA and L M1 occlusion. He was taken to IR where he had revascularization of the L ICA occlusion w/ angioplasty and L ICA stent, and mechanical thrombectomy of L M2 occlusion. Extubated post procedure.Follow up MRI/MRA brain showed  restored patency of L-ICA and L-MCA, mild cytotoxic edema and left basal ganglia infarct, small infarct in left amygdala and small number of scatterd tiny cortical infarcts in L-MCA territory. He had problems with hypotension after ICA stenting was placed on Neo-Synephrine drip as well as albumin and stress dose steroids. He wasput onoral midodrine and florinef for BP support.Discussed with his oncologist and feltstroke was not related to hypercoagulable state but likely cardioembolic source therefore TEE done on3/4/19and  showed dilated left atrium butnegative for thrombus, NO PFO/ASD and no source of cardiac or aortic emboli. He was started on ASA/Plavix for embolic stroke and loop recorder recommended to rule out A fib. Blood sugars have been poorly controlled and low dose Lantus recommended for tighter control. Patient with resultant left sided weakness with shuffling gait with need for rest breaks, cognitive deficits and difficulty with ADL tasks. CIR recommendedand pt admitted to CIR on 05/11/17.  Pt doing well with rehab for about a week, weakness gradually improved and able to walk with PT 450 feet with minimal assist on 05/15/08. However, on 05/16/17, he did not feel well, with fever 101, CXR concerning for infection and he was put on levaquin. 05/17/17 pt no fever but very lethargic with AMS, essentially non verbal, but able to tell his name. Pt completely off his baseline for the last week, which promoted CT stat which showed hemorrhagic conversion of left BG infarct with 22mm midline shift. Neurology called and felt pt needs to transfer back to inpt service with ICU monitoring for hemorrhagic conversion. BP at 150s, stopped midodrine and florinef and gave labetalol PRN with goal < 140. Repeat CT head stable.  Medications adjusted and patient now restarted on ASA.  EEG completed with focal slowing over theleft temporalregion.  Patient cleared by Dr.Xu for re-admission to IP Rehab 05/20/17.    NIH  Total: 9  Past Medical History      Past Medical History:  Diagnosis Date  . Cancer ([place])   . COPD (chronic obstructive pulmonary disease) ([place])   . Diabetes mellitus without complication ([place])   . GERD without esophagitis   . Hepatitis C virus    history of hep C treated with Harvoni  . Hypercholesterolemia   . Hypertension   . Seasonal allergies     Family History  family history includes Lung cancer (age of onset: 16) in his sister; Ovarian cancer in his sister; Skin cancer in his mother and sister; Throat cancer (age of onset: 74) in his brother; Throat cancer (age of onset: 68) in his brother.  Prior Rehab/Hospitalizations:  Has the patient had major surgery during 100 days prior to admission? No  Current Medications   Current Facility-Administered Medications:  .  0.9 %  sodium chloride infusion, , Intravenous, Continuous, Rosalin Hawking, MD, Last Rate: 40 mL/hr at 05/19/17 1757, 1,000 mL at 05/19/17 1757 .  acetaminophen (TYLENOL) tablet 650 mg, 650 mg, Oral, Q4H PRN **OR** acetaminophen (TYLENOL) solution 650 mg, 650 mg, Per Tube, Q4H PRN **OR** acetaminophen (TYLENOL) suppository 650 mg, 650 mg, Rectal, Q4H PRN, Rosalin Hawking, MD .  albuterol (PROVENTIL) (2.5 MG/3ML) 0.083% nebulizer solution 3 mL, 3 mL, Inhalation, Q6H PRN, Rosalin Hawking, MD .  aspirin tablet 325 mg, 325 mg, Oral, Daily, Rosalin Hawking, MD, 325 mg at 05/20/17 0907 .  bisacodyl (DULCOLAX) suppository 10 mg, 10 mg, Rectal, Daily PRN, Rosalin Hawking, MD, 10 mg at 05/18/17 0939 .  chlorhexidine (PERIDEX) 0.12 % solution 15 mL, 15 mL, Mouth Rinse, BID, Rosalin Hawking, MD, 15 mL at 05/20/17 0908 .  fluticasone furoate-vilanterol (BREO ELLIPTA) 200-25 MCG/INH 1 puff, 1 puff, Inhalation, Daily, Rosalin Hawking, MD, 1 puff at 05/20/17 0746 .  heparin injection 5,000 Units, 5,000 Units, Subcutaneous, Q8H, Rosalin Hawking, MD, 5,000 Units at 05/20/17 0640 .  insulin aspart (novoLOG) injection 0-15 Units, 0-15 Units,  Subcutaneous, QID, Rosalin Hawking, MD, 3 Units at 05/20/17 [phone] .  labetalol (NORMODYNE,TRANDATE) injection 10-20 mg, 10-20 mg, Intravenous, Q10  min PRN, Rosalin Hawking, MD, 10 mg at 05/18/17 1527 .  levofloxacin (LEVAQUIN) tablet 500 mg, 500 mg, Oral, Daily, Rosalin Hawking, MD, 500 mg at 05/20/17 0907 .  MEDLINE mouth rinse, 15 mL, Mouth Rinse, q12n4p, Rosalin Hawking, MD, 15 mL at 05/19/17 1735 .  ondansetron (ZOFRAN) injection 4 mg, 4 mg, Intravenous, Q6H PRN, Rosalin Hawking, MD .  pantoprazole (PROTONIX) EC tablet 40 mg, 40 mg, Oral, Daily, Rosalin Hawking, MD, 40 mg at 05/20/17 0907 .  sodium chloride flush (NS) 0.9 % injection 10-40 mL, 10-40 mL, Intracatheter, PRN, Rosalin Hawking, MD .  sodium phosphate (FLEET) 7-19 GM/118ML enema 1 enema, 1 enema, Rectal, Once PRN, Rosalin Hawking, MD  Patients Current Diet: Fall precautions DIET - DYS 1 Room service appropriate? Yes; Fluid consistency: Thin  Precautions / Restrictions Precautions Precautions: Fall Precaution Comments: aphasic, right sided weakness.  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  He was active around the house, repairs vacuums, and is an active church member as well.    Home Assistive Devices / Equipment Home Assistive Devices/Equipment: Eyeglasses, CBG Meter, Dentures (specify type) Home Equipment: None  Prior Device Use: Indicate devices/aids used by the patient prior to current illness, exacerbation or injury? None of the above  Prior Functional Level Prior Function Level of Independence: Independent Comments: pt was indendent prior to admission in feb. Since then had progressed to minguard for ambulation and assist for ADLs  Self Care: Did the patient need help bathing, dressing, using the toilet or eating?  Independent  Indoor Mobility: Did the patient need assistance with walking from room to room (with or without device)? Independent  Stairs: Did the patient need assistance with internal or external stairs (with or without device)? Independent  Functional Cognition: Did the patient need help planning regular tasks such as shopping or remembering to take medications? Independent  Current Functional Level Cognition  Arousal/Alertness: Awake/alert Overall Cognitive Status: Impaired/Different from baseline Difficult to assess due to: Impaired communication Current Attention Level: Sustained Orientation Level: Oriented X4 Following Commands: Follows one step commands with increased time Safety/Judgement: Decreased awareness of deficits General Comments: Pt needs extra time to process information and commands, but once he does, he can follow them accurately.  Attention: Sustained Sustained Attention: Impaired Sustained Attention Impairment: Verbal basic, Functional basic Selective Attention: Impaired Selective Attention Impairment: Verbal basic, Functional basic Memory: (difficult to assess d/t aphasia)    Extremity Assessment (includes Sensation/Coordination)  Upper Extremity Assessment: RUE deficits/detail RUE Deficits / Details: Decreased grasp strength and poor cooridnation. required increased cues, time, and effort for finger opposition. undershooting during finger-to-nose test presenting with dysmetria.  RUE Coordination: decreased fine motor, decreased gross motor  Lower Extremity Assessment: Defer to PT evaluation RLE Deficits / Details: grossly 45 LLE Deficits / Details: WFL    ADLs  Overall ADL's : Needs assistance/impaired Eating/Feeding: Set up, Supervision/ safety, Sitting Grooming: Wash/dry hands, Set up, Sitting Upper Body Bathing: Minimal assistance, Sitting, Cueing for sequencing Lower Body Bathing: Minimal assistance, Sit to/from stand Upper Body Dressing :  Minimal assistance, Sitting, Cueing for sequencing Upper Body Dressing Details (indicate cue type and reason): Requiring max cues for seuqencing and cognition to don gown Lower Body Dressing: Minimal assistance, Sit to/from stand Toilet Transfer: Minimal assistance, +2 for safety/equipment, BSC,  Stand-pivot Armed forces technical officer Details (indicate cue type and reason): Pt performing stand pivot to [facility] with Min A +2. Incontinant of stool.  Toileting- Water quality scientist and Hygiene: Sit to/from stand, Moderate assistance Toileting - Clothing Manipulation Details (indicate cue type and reason): Mod A for toilet hygiene after BM.  Functional mobility during ADLs: Min guard, Rolling walker General ADL Comments: Pt demonstrating decreased fucntional performance. Pt with poor cognition, balance, and fucnitonal use of RUE.    Mobility  Overal bed mobility: Needs Assistance Bed Mobility: Supine to Sit Supine to sit: Min assist Sit to supine: Min assist General bed mobility comments: Min assist at the end of the transition to keep him from falling to the right.     Transfers  Overall transfer level: Needs assistance Equipment used: 1 person hand held assist Transfers: Sit to/from Stand, Stand Pivot Transfers Sit to Stand: Min assist, Mod assist Stand pivot transfers: Min assist General transfer comment: Min assist to stand from elevated bed, mod assist from lower chair.  Slow transitions and small, choppy steps with difficulty progressing his right leg over to take steps.  He is soft at his right knee, but never buckled.      Ambulation / Gait / Stairs / Wheelchair Mobility  Ambulation/Gait General Gait Details: would be safer to attempt with a second person.     Posture / Balance Dynamic Sitting Balance Sitting balance - Comments: Initially upon sitting pt leaning to the right, but once feet on the floor and under him and L hand on foot board, pt was able to sit in midline and even transition  from hand support to no hand support.  Balance Overall balance assessment: Needs assistance Sitting-balance support: Feet supported, Single extremity supported Sitting balance-Leahy Scale: Fair Sitting balance - Comments: Initially upon sitting pt leaning to the right, but once feet on the floor and under him and L hand on foot board, pt was able to sit in midline and even transition from hand support to no hand support.  Postural control: Right lateral lean Standing balance support: Single extremity supported Standing balance-Leahy Scale: Poor Standing balance comment: needs min assist in standing and while stepping.     Special needs/care consideration BiPAP/CPAP: No CPM: NO Continuous Drip IV: No Dialysis: No         Life Vest: No Oxygen: No Special Bed: No Trach Size: No Wound Vac (area): No      Skin: Dry with ecchymosis to bilateral arms and abdomen                                 Bowel mgmt: Incontinent, last BM 05/18/17 Bladder mgmt: Incontinent with external foley  Diabetic mgmt: Yes, HgbA1c 9.1 and took oral medications to manage prior to admission      Previous Home Environment Living Arrangements: Spouse/significant other  Lives With: Spouse Available Help at Discharge: Family, Available 24 hours/day Type of Home: House Home Layout: One level Home Access: Stairs to enter Entrance Stairs-Rails: Right Entrance Stairs-Number of Steps: 2 Bathroom Shower/Tub: Chiropodist: Standard Home Care Services: No  Discharge Living Setting Plans for Discharge Living Setting: Lives with (comment) Type of Home at Discharge: House Discharge Home Layout: One level Discharge Home Access: Stairs to enter Entrance Stairs-Rails: Right Entrance Stairs-Number of Steps: 2 Discharge Bathroom Shower/Tub: Tub/shower unit, Curtain Discharge Bathroom Toilet: Standard Discharge Bathroom Accessibility: Yes How Accessible: Accessible via walker Does the  patient  have any problems obtaining your medications?: No  Social/Family/Support Systems Patient Roles: Spouse, Parent, Other (Comment)(Church Deacon ) Contact Information: Spouse: Chemical engineer  Anticipated Caregiver: Wife Anticipated Caregiver's Contact Information: see above  Ability/Limitations of Caregiver: Wife has health issues and needs to be careful-heart issues and SOB at times Caregiver Availability: 24/7 Discharge Plan Discussed with Primary Caregiver: Yes Is Caregiver In Agreement with Plan?: Yes Does Caregiver/Family have Issues with Lodging/Transportation while Pt is in Rehab?: No  Goals/Additional Needs Patient/Family Goal for Rehab: PT/OT: Supervision-Min A; [id number] A  Expected length of stay: 12-16 days  Cultural Considerations: Baptist, active as a Deacon in [employer]  Dietary Needs: Dys.1 textures and thin liquids  Equipment Needs: TBD Pt/Family Agrees to Admission and willing to participate: Yes Program Orientation Provided & Reviewed with Pt/Caregiver Including Roles  & Responsibilities: Yes Additional Information Needs: Spouse with questions about equip. and home modifications  Information Needs to be Provided By: Team  Barriers to Discharge: Incontinence  Decrease burden of Care through IP rehab admission: No  Possible need for SNF placement upon discharge: Not anticipated   Patient Condition: This patient's medical and functional status has changed since the consult dated: 05/09/17 in which the Rehabilitation Physician determined and documented that the patient's condition is appropriate for intensive rehabilitative care in an inpatient rehabilitation facility. See "History of Present Illness" (above) for medical update. Functional changes as a result of CVA hemorrhagic conversation are: Min-Mod A transfers and anticipate +2 assist for gait . Patient's medical and functional status update has been discussed with the Rehabilitation physician and patient  remains appropriate for inpatient rehabilitation. Will re-admit to inpatient rehab today.  Preadmission Screen Completed By:  Gunnar Fusi, 05/20/2017 12:55 PM ______________________________________________________________________   Discussed status with Dr. Letta Pate on 05/20/17 at 1315 and received telephone approval for admission today.  Admission Coordinator:  Gunnar Fusi, time 1315/Date 05/20/17             Cosigned by: Charlett Blake, MD at 05/20/2017 3:26 PM  Revision History

## 2017-05-20 NOTE — Progress Notes (Signed)
Physical Therapy Treatment Patient Details Name: Edwin Jones MRN: [id number] DOB: 11/17/1940 Today's Date: 05/20/2017    History of Present Illness [name] is a 77 y.o. 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 infarct to the left BG, left amygdala and small scattered tiny cortical infarcts throughout the left MCA territory. Who progressed and went to CIR 3/5. Returned to ICU 3/11 with increased lethargy with hemorrhagic conversion of left basal ganglia infarct    PT Comments    Pt was able to sit EOB and transfer OOB to chair with multiple sit to stands for strengthening.  He is ready for gait progression, but would need a second person to ensure safety during his initial attempts.  He remains appropriate for CIR level rehab at discharge and his wife is hopeful for him to get back there soon.    Follow Up Recommendations  CIR;Supervision/Assistance - 24 hour     Equipment Recommendations  None recommended by PT    Recommendations for Other Services   NA     Precautions / Restrictions Precautions Precautions: Fall Precaution Comments: aphasic, right sided weakness.  Restrictions Weight Bearing Restrictions: No    Mobility  Bed Mobility Overal bed mobility: Needs Assistance Bed Mobility: Supine to Sit     Supine to sit: Min assist     General bed mobility comments: Min assist at the end of the transition to keep him from falling to the right.   Transfers Overall transfer level: Needs assistance Equipment used: 1 person hand held assist Transfers: Sit to/from [facility] Sit to Stand: Min assist;Mod assist Stand pivot transfers: Min assist       General transfer comment: Min assist to stand from elevated bed, mod assist from lower chair.  Slow transitions and small, choppy steps with difficulty  progressing his right leg over to take steps.  He is soft at his right knee, but never buckled.    Ambulation/Gait             General Gait Details: would be safer to attempt with a second person.        Modified Rankin (Stroke Patients Only) Modified Rankin (Stroke Patients Only) Pre-Morbid Rankin Score: Moderately severe disability Modified Rankin: Moderately severe disability     Balance Overall balance assessment: Needs assistance Sitting-balance support: Feet supported;Single extremity supported Sitting balance-Leahy Scale: Fair Sitting balance - Comments: Initially upon sitting pt leaning to the right, but once feet on the floor and under him and L hand on foot board, pt was able to sit in midline and even transition from hand support to no hand support.  Postural control: Right lateral lean Standing balance support: Single extremity supported Standing balance-Leahy Scale: Poor Standing balance comment: needs min assist in standing and while stepping.                             Cognition Arousal/Alertness: Awake/alert Behavior During Therapy: WFL for tasks assessed/performed(smiling today) Overall Cognitive Status: Impaired/Different from baseline Area of Impairment: Attention;Following commands;Problem solving                   Current Attention Level: Sustained   Following Commands: Follows one step commands with increased time Safety/Judgement: Decreased awareness of deficits Awareness: Emergent Problem Solving: Slow processing General Comments: Pt needs extra time to  process information and commands, but once he does, he can follow them accurately.              Pertinent Vitals/Pain Pain Assessment: Faces Faces Pain Scale: No hurt           PT Goals (current goals can now be found in the care plan section) Acute Rehab PT Goals Patient Stated Goal: wife wants him to go back for intensive therapy Progress towards PT goals:  Progressing toward goals    Frequency    Min 4X/week      PT Plan Frequency needs to be updated       AM-PAC PT "6 Clicks" Daily Activity  Outcome Measure  Difficulty turning over in bed (including adjusting bedclothes, sheets and blankets)?: A Little Difficulty moving from lying on back to sitting on the side of the bed? : Unable Difficulty sitting down on and standing up from a chair with arms (e.g., wheelchair, bedside commode, etc,.)?: Unable Help needed moving to and from a bed to chair (including a wheelchair)?: A Little Help needed walking in hospital room?: A Lot Help needed climbing 3-5 steps with a railing? : A Lot 6 Click Score: 12    End of Session   Activity Tolerance: Patient tolerated treatment well Patient left: in chair;with call bell/phone within reach;with family/visitor present   PT Visit Diagnosis: Unsteadiness on feet (R26.81);Other abnormalities of gait and mobility (R26.89);Hemiplegia and hemiparesis Hemiplegia - Right/Left: Right Hemiplegia - dominant/non-dominant: Dominant Hemiplegia - caused by: Cerebral infarction;Nontraumatic intracerebral hemorrhage     Time: 1146-1208 PT Time Calculation (min) (ACUTE ONLY): 22 min  Charges:  $Therapeutic Activity: 8-22 mins      Matricia Begnaud B. Ephram Kornegay, PT, DPT [phone]          05/20/2017, 12:19 PM

## 2017-05-20 NOTE — PMR Pre-admission (Signed)
PMR Admission Coordinator Pre-Admission Assessment  Patient: Edwin Jones is an 77 y.o., male MRN: [id number] DOB: January 27, 1941 Height: 6\' 3"  (190.5 cm) Weight: 94 kg (207 lb 3.7 oz)              Insurance Information HMO:     PPO:      PCP:      IPA:      80/20:      OTHER:  PRIMARY: Medicare A & B      Policy#: [id number]      Subscriber: Self CM Name:       Phone#:      Fax#:  Pre-Cert#: Eligible       Employer: Retired Benefits:  Phone #: Verified online     Name: [employer]  Eff. Date: A:01/07/05 B:09/06/09     Deduct: $1364      Out of Pocket Max: N/A      Life Max: N/A CIR: 100%      SNF: 100% days 1-20; 80% days 21-100 Outpatient: 80%     Co-Pay: 20% Home Health: 100%      Co-Pay: $0 DME: 80%     Co-Pay: 20% Providers: Patient's Choice   SECONDARY: None       Medicaid Application Date:       Case Manager:  Disability Application Date:       Case Worker:   Emergency Contact Information Contact Information    Name Relation Home Work Mobile   Judy,[name] Spouse [phone]   Otho Ket   [phone]     Current Medical History  Patient Admitting Diagnosis: Left CVA now with hemorrhage conversion and 5 mm rightward midline shift.  History of Present Illness: Edwin Jones is a 77 y.o. 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 No tPA given due to concern of brain metastasis.CTA showed a L ICA and L M1 occlusion. He was taken to IR where he had revascularization of the L ICA occlusion w/ angioplasty and L ICA stent, and mechanical thrombectomy of L M2 occlusion. Extubated post procedure. Follow up MRI/MRA brain showed restored patency of L-ICA and L-MCA, mild cytotoxic edema and left basal ganglia infarct, small infarct in left amygdala and small number of scatterd  tiny cortical infarcts in L-MCA territory. He had problems with hypotension after ICA stenting was placed on Neo-Synephrine drip as well as albumin and stress dose steroids. He was put on oral midodrine and florinef for BP support. Discussed with his oncologist and felt stroke was not related to hypercoagulable state but likely cardioembolic source therefore TEE done on 05/10/17 and showed dilated left atrium but negative for thrombus, NO PFO/ASD and no source of cardiac or aortic emboli. He was started on ASA/Plavix for embolic stroke and loop recorder recommended to rule out A fib. Blood sugars have been poorly controlled and low dose Lantus recommended for tighter control. Patient with resultant left sided weakness with shuffling gait with need for rest breaks, cognitive deficits and difficulty with ADL tasks. CIR recommended and pt admitted to CIR on 05/11/17.  Pt doing well with rehab for about a week, weakness gradually improved and able to walk with PT 450 feet with minimal assist on 05/15/08. However, on 05/16/17, he did not feel well, with fever 101, CXR concerning for infection and he was put on  levaquin. 05/17/17 pt no fever but very lethargic with AMS, essentially non verbal, but able to tell his name. Pt completely off his baseline for the last week, which promoted CT stat which showed hemorrhagic conversion of left BG infarct with 72mm midline shift. Neurology called and felt pt needs to transfer back to inpt service with ICU monitoring for hemorrhagic conversion. BP at 150s, stopped midodrine and florinef and gave labetalol PRN with goal < 140. Repeat CT head stable.  Medications adjusted and patient now restarted on ASA.  EEG completed with focal slowing over the left temporal region.  Patient cleared by Dr.Xu for re-admission to IP Rehab 05/20/17.    NIH Total: 9    Past Medical History  Past Medical History:  Diagnosis Date  . Cancer ([place])   . COPD (chronic obstructive pulmonary disease) ([place])    . Diabetes mellitus without complication ([place])   . GERD without esophagitis   . Hepatitis C virus    history of hep C treated with Harvoni  . Hypercholesterolemia   . Hypertension   . Seasonal allergies     Family History  family history includes Lung cancer (age of onset: 32) in his sister; Ovarian cancer in his sister; Skin cancer in his mother and sister; Throat cancer (age of onset: 55) in his brother; Throat cancer (age of onset: 23) in his brother.  Prior Rehab/Hospitalizations:  Has the patient had major surgery during 100 days prior to admission? No  Current Medications   Current Facility-Administered Medications:  .  0.9 %  sodium chloride infusion, , Intravenous, Continuous, Rosalin Hawking, MD, Last Rate: 40 mL/hr at 05/19/17 1757, 1,000 mL at 05/19/17 1757 .  acetaminophen (TYLENOL) tablet 650 mg, 650 mg, Oral, Q4H PRN **OR** acetaminophen (TYLENOL) solution 650 mg, 650 mg, Per Tube, Q4H PRN **OR** acetaminophen (TYLENOL) suppository 650 mg, 650 mg, Rectal, Q4H PRN, Rosalin Hawking, MD .  albuterol (PROVENTIL) (2.5 MG/3ML) 0.083% nebulizer solution 3 mL, 3 mL, Inhalation, Q6H PRN, Rosalin Hawking, MD .  aspirin tablet 325 mg, 325 mg, Oral, Daily, Rosalin Hawking, MD, 325 mg at 05/20/17 0907 .  bisacodyl (DULCOLAX) suppository 10 mg, 10 mg, Rectal, Daily PRN, Rosalin Hawking, MD, 10 mg at 05/18/17 0939 .  chlorhexidine (PERIDEX) 0.12 % solution 15 mL, 15 mL, Mouth Rinse, BID, Rosalin Hawking, MD, 15 mL at 05/20/17 0908 .  fluticasone furoate-vilanterol (BREO ELLIPTA) 200-25 MCG/INH 1 puff, 1 puff, Inhalation, Daily, Rosalin Hawking, MD, 1 puff at 05/20/17 0746 .  heparin injection 5,000 Units, 5,000 Units, Subcutaneous, Q8H, Rosalin Hawking, MD, 5,000 Units at 05/20/17 0640 .  insulin aspart (novoLOG) injection 0-15 Units, 0-15 Units, Subcutaneous, QID, Rosalin Hawking, MD, 3 Units at 05/20/17 [phone] .  labetalol (NORMODYNE,TRANDATE) injection 10-20 mg, 10-20 mg, Intravenous, Q10 min PRN, Rosalin Hawking, MD, 10 mg at  05/18/17 1527 .  levofloxacin (LEVAQUIN) tablet 500 mg, 500 mg, Oral, Daily, Rosalin Hawking, MD, 500 mg at 05/20/17 0907 .  MEDLINE mouth rinse, 15 mL, Mouth Rinse, q12n4p, Rosalin Hawking, MD, 15 mL at 05/19/17 1735 .  ondansetron (ZOFRAN) injection 4 mg, 4 mg, Intravenous, Q6H PRN, Rosalin Hawking, MD .  pantoprazole (PROTONIX) EC tablet 40 mg, 40 mg, Oral, Daily, Rosalin Hawking, MD, 40 mg at 05/20/17 0907 .  sodium chloride flush (NS) 0.9 % injection 10-40 mL, 10-40 mL, Intracatheter, PRN, Rosalin Hawking, MD .  sodium phosphate (FLEET) 7-19 GM/118ML enema 1 enema, 1 enema, Rectal, Once PRN, Rosalin Hawking, MD  Patients Current Diet: Fall  precautions DIET - DYS 1 Room service appropriate? Yes; Fluid consistency: Thin  Precautions / Restrictions Precautions Precautions: Fall Precaution Comments: aphasic, right sided weakness.  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  He was active around the house, repairs vacuums, and is an active church member as well.    Home Assistive Devices / Equipment Home Assistive Devices/Equipment: Eyeglasses, CBG Meter, Dentures (specify type) Home Equipment: None  Prior Device Use: Indicate devices/aids used by the patient prior to current illness, exacerbation or injury? None of the above  Prior Functional Level Prior Function Level of Independence: Independent Comments: pt was indendent prior to admission in feb. Since then had progressed to minguard for ambulation and assist for ADLs  Self Care: Did the patient need help bathing, dressing, using the toilet or eating? Independent  Indoor Mobility: Did the patient need assistance with walking from room to room (with or without device)? Independent  Stairs: Did the patient need assistance with internal  or external stairs (with or without device)? Independent  Functional Cognition: Did the patient need help planning regular tasks such as shopping or remembering to take medications? Independent  Current Functional Level Cognition  Arousal/Alertness: Awake/alert Overall Cognitive Status: Impaired/Different from baseline Difficult to assess due to: Impaired communication Current Attention Level: Sustained Orientation Level: Oriented X4 Following Commands: Follows one step commands with increased time Safety/Judgement: Decreased awareness of deficits General Comments: Pt needs extra time to process information and commands, but once he does, he can follow them accurately.  Attention: Sustained Sustained Attention: Impaired Sustained Attention Impairment: Verbal basic, Functional basic Selective Attention: Impaired Selective Attention Impairment: Verbal basic, Functional basic Memory: (difficult to assess d/t aphasia)    Extremity Assessment (includes Sensation/Coordination)  Upper Extremity Assessment: RUE deficits/detail RUE Deficits / Details: Decreased grasp strength and poor cooridnation. required increased cues, time, and effort for finger opposition. undershooting during finger-to-nose test presenting with dysmetria.  RUE Coordination: decreased fine motor, decreased gross motor  Lower Extremity Assessment: Defer to PT evaluation RLE Deficits / Details: grossly 45 LLE Deficits / Details: WFL    ADLs  Overall ADL's : Needs assistance/impaired Eating/Feeding: Set up, Supervision/ safety, Sitting Grooming: Wash/dry hands, Set up, Sitting Upper Body Bathing: Minimal assistance, Sitting, Cueing for sequencing Lower Body Bathing: Minimal assistance, Sit to/from stand Upper Body Dressing : Minimal assistance, Sitting, Cueing for sequencing Upper Body Dressing Details (indicate cue type and reason): Requiring max cues for seuqencing and cognition to don gown Lower Body Dressing:  Minimal assistance, Sit to/from stand Toilet Transfer: Minimal assistance, +2 for safety/equipment, BSC, Stand-pivot Toilet Transfer Details (indicate cue type and reason): Pt performing stand pivot to [facility] with Min A +2. Incontinant of stool.  Toileting- Water quality scientist and Hygiene: Sit to/from stand, Moderate assistance Toileting - Clothing Manipulation Details (indicate cue type and reason): Mod A for toilet hygiene after BM.  Functional mobility during ADLs: Min guard, Rolling walker General ADL Comments: Pt demonstrating decreased fucntional performance. Pt with poor cognition, balance, and fucnitonal use of RUE.    Mobility  Overal bed mobility: Needs Assistance Bed Mobility: Supine to Sit Supine to sit: Min assist Sit to supine: Min assist General bed mobility comments: Min assist at the end of the transition to keep him from falling to the right.  Transfers  Overall transfer level: Needs assistance Equipment used: 1 person hand held assist Transfers: Sit to/from Stand, Stand Pivot Transfers Sit to Stand: Min assist, Mod assist Stand pivot transfers: Min assist General transfer comment: Min assist to stand from elevated bed, mod assist from lower chair.  Slow transitions and small, choppy steps with difficulty progressing his right leg over to take steps.  He is soft at his right knee, but never buckled.      Ambulation / Gait / Stairs / Wheelchair Mobility  Ambulation/Gait General Gait Details: would be safer to attempt with a second person.     Posture / Balance Dynamic Sitting Balance Sitting balance - Comments: Initially upon sitting pt leaning to the right, but once feet on the floor and under him and L hand on foot board, pt was able to sit in midline and even transition from hand support to no hand support.  Balance Overall balance assessment: Needs assistance Sitting-balance support: Feet supported, Single extremity supported Sitting balance-Leahy Scale:  Fair Sitting balance - Comments: Initially upon sitting pt leaning to the right, but once feet on the floor and under him and L hand on foot board, pt was able to sit in midline and even transition from hand support to no hand support.  Postural control: Right lateral lean Standing balance support: Single extremity supported Standing balance-Leahy Scale: Poor Standing balance comment: needs min assist in standing and while stepping.     Special needs/care consideration BiPAP/CPAP: No CPM: NO Continuous Drip IV: No Dialysis: No         Life Vest: No Oxygen: No Special Bed: No Trach Size: No Wound Vac (area): No      Skin: Dry with ecchymosis to bilateral arms and abdomen                                 Bowel mgmt: Incontinent, last BM 05/18/17 Bladder mgmt: Incontinent with external foley  Diabetic mgmt: Yes, HgbA1c 9.1 and took oral medications to manage prior to admission      Previous Home Environment Living Arrangements: Spouse/significant other  Lives With: Spouse Available Help at Discharge: Family, Available 24 hours/day Type of Home: House Home Layout: One level Home Access: Stairs to enter Entrance Stairs-Rails: Right Entrance Stairs-Number of Steps: 2 Bathroom Shower/Tub: Chiropodist: [facility]: No  Discharge Living Setting Plans for Discharge Living Setting: Lives with (comment) Type of Home at Discharge: House Discharge Home Layout: One level Discharge Home Access: Stairs to enter Entrance Stairs-Rails: Right Entrance Stairs-Number of Steps: 2 Discharge Bathroom Shower/Tub: Tub/shower unit, Curtain Discharge Bathroom Toilet: Standard Discharge Bathroom Accessibility: Yes How Accessible: Accessible via walker Does the patient have any problems obtaining your medications?: No  Social/Family/Support Systems Patient Roles: Spouse, Parent, Other (Comment)(Church Deacon ) Contact Information: Spouse: Chemical engineer   Anticipated Caregiver: Wife Anticipated Caregiver's Contact Information: see above  Ability/Limitations of Caregiver: Wife has health issues and needs to be careful-heart issues and SOB at times Caregiver Availability: 24/7 Discharge Plan Discussed with Primary Caregiver: Yes Is Caregiver In Agreement with Plan?: Yes Does Caregiver/Family have Issues with Lodging/Transportation while Pt is in Rehab?: No  Goals/Additional Needs Patient/Family Goal for Rehab: PT/OT: Supervision-Min A; [id number] A  Expected length of stay: 12-16 days  Cultural Considerations: Baptist, active as a Deacon in [employer]  Dietary Needs: Dys.1 textures and thin liquids  Equipment Needs: TBD Pt/Family Agrees  to Admission and willing to participate: Yes Program Orientation Provided & Reviewed with Pt/Caregiver Including Roles  & Responsibilities: Yes Additional Information Needs: Spouse with questions about equip. and home modifications  Information Needs to be Provided By: Team  Barriers to Discharge: Incontinence  Decrease burden of Care through IP rehab admission: No  Possible need for SNF placement upon discharge: Not anticipated   Patient Condition: This patient's medical and functional status has changed since the consult dated: 05/09/17 in which the Rehabilitation Physician determined and documented that the patient's condition is appropriate for intensive rehabilitative care in an inpatient rehabilitation facility. See "History of Present Illness" (above) for medical update. Functional changes as a result of CVA hemorrhagic conversation are: Min-Mod A transfers and anticipate +2 assist for gait . Patient's medical and functional status update has been discussed with the Rehabilitation physician and patient remains appropriate for inpatient rehabilitation. Will re-admit to inpatient rehab today.  Preadmission Screen Completed By:  Gunnar Fusi, 05/20/2017 12:55  PM ______________________________________________________________________   Discussed status with Dr. Letta Pate on 05/20/17 at 1315 and received telephone approval for admission today.  Admission Coordinator:  Gunnar Fusi, time 1315/Date 05/20/17

## 2017-05-20 NOTE — Discharge Summary (Addendum)
Stroke Discharge Summary  Patient ID: Edwin Jones   MRN: [id number]      DOB: Apr 25, 1940  Date of Admission: 05/17/2017 Date of Discharge: 05/20/2017  Attending Physician:  Rosalin Hawking, MD, Stroke MD Consultant(s):   None Patient's PCP:  Marguerita Merles, MD  Discharge Diagnoses:  Hemorrhagic conversion of left MCA Infarct  Active Problems:   Hx of stroke - left infarct due to left ICA and left M1 occlusion, s/p mechanical thrombectomy and ICA stenting   Small cell lung cancer   DM   HLD   CKD   dysphagia   Pneumonia   Cerebral edema ([place])  Past Medical History:  Diagnosis Date  . Cancer ([place])   . COPD (chronic obstructive pulmonary disease) ([place])   . Diabetes mellitus without complication ([place])   . GERD without esophagitis   . Hepatitis C virus    history of hep C treated with Harvoni  . Hypercholesterolemia   . Hypertension   . Seasonal allergies    Past Surgical History:  Procedure Laterality Date  . COLONOSCOPY     approximately 11 years ago  . FLEXIBLE BRONCHOSCOPY N/A 11/05/2016   Procedure: FLEXIBLE BRONCHOSCOPY;  Surgeon: Wilhelmina Mcardle, MD;  Location: [facility];  Service: Pulmonary;  Laterality: N/A;  . IR FLUORO GUIDE CV LINE RIGHT  05/06/2017  . IR INTRAVSC STENT CERV CAROTID W/O EMB-PROT MOD SED INC ANGIO  05/06/2017  . IR PERCUTANEOUS ART THROMBECTOMY/INFUSION INTRACRANIAL INC DIAG ANGIO  05/06/2017  . IR US GUIDE VASC ACCESS LEFT  05/06/2017  . IR US GUIDE VASC ACCESS RIGHT  05/06/2017  . IR US GUIDE VASC ACCESS RIGHT  05/06/2017  . PORTA CATH INSERTION N/A 11/23/2016   Procedure: Glori Luis Cath Insertion;  Surgeon: Algernon Huxley, MD;  Location: [place] [facility];  Service: Cardiovascular;  Laterality: N/A;  . RADIOLOGY WITH ANESTHESIA N/A 05/06/2017   Procedure: IR WITH ANESTHESIA;  Surgeon: Radiologist, Medication, MD;  Location: [place];  Service: Radiology;  Laterality: N/A;  . TEE WITHOUT CARDIOVERSION N/A 05/10/2017   Procedure: TRANSESOPHAGEAL  ECHOCARDIOGRAM (TEE);  Surgeon: Sanda Klein, MD;  Location: [facility];  Service: Cardiovascular;  Laterality: N/A;  . TONSILLECTOMY      Medications to be continued on Rehab . aspirin  325 mg Oral Daily  . chlorhexidine  15 mL Mouth Rinse BID  . fluticasone furoate-vilanterol  1 puff Inhalation Daily  . heparin injection (subcutaneous)  5,000 Units Subcutaneous Q8H  . insulin aspart  0-15 Units Subcutaneous QID  . levofloxacin  500 mg Oral Daily  . mouth rinse  15 mL Mouth Rinse q12n4p  . pantoprazole  40 mg Oral Daily    LABORATORY STUDIES CBC    Component Value Date/Time   WBC 5.4 05/20/2017 0431   RBC 3.39 (L) 05/20/2017 0431   HGB 10.2 (L) 05/20/2017 0431   HCT 32.2 (L) 05/20/2017 0431   PLT 290 05/20/2017 0431   MCV 95.0 05/20/2017 0431   MCH 30.1 05/20/2017 0431   MCHC 31.7 05/20/2017 0431   RDW 13.0 05/20/2017 0431   LYMPHSABS 1.4 05/17/2017 1257   MONOABS 0.7 05/17/2017 1257   EOSABS 0.1 05/17/2017 1257   BASOSABS 0.0 05/17/2017 1257   CMP    Component Value Date/Time   NA 135 05/20/2017 0431   K 4.3 05/20/2017 0431   CL 104 05/20/2017 0431   CO2 22 05/20/2017 0431   GLUCOSE 171 (H) 05/20/2017 0431   BUN 15 05/20/2017  0431   CREATININE 1.87 (H) 05/20/2017 0431   CALCIUM 8.6 (L) 05/20/2017 0431   PROT 5.9 (L) 05/12/2017 0753   ALBUMIN 2.4 (L) 05/12/2017 0753   AST 16 05/12/2017 0753   ALT 11 (L) 05/12/2017 0753   ALKPHOS 68 05/12/2017 0753   BILITOT 0.5 05/12/2017 0753   GFRNONAA 33 (L) 05/20/2017 0431   GFRAA 38 (L) 05/20/2017 0431   COAGS Lab Results  Component Value Date   INR 0.92 05/06/2017   INR 0.92 10/30/2016   Lipid Panel    Component Value Date/Time   CHOL 173 05/07/2017 0500   TRIG 215 (H) 05/07/2017 0500   HDL 61 05/07/2017 0500   CHOLHDL 2.8 05/07/2017 0500   VLDL 43 (H) 05/07/2017 0500   LDLCALC 69 05/07/2017 0500   HgbA1C  Lab Results  Component Value Date   HGBA1C 9.1 (H) 05/07/2017   Urinalysis    Component  Value Date/Time   COLORURINE YELLOW 05/16/2017 1814   APPEARANCEUR CLEAR 05/16/2017 1814   LABSPEC 1.020 05/16/2017 1814   PHURINE 5.0 05/16/2017 1814   GLUCOSEU NEGATIVE 05/16/2017 1814   HGBUR NEGATIVE 05/16/2017 1814   BILIRUBINUR NEGATIVE 05/16/2017 1814   KETONESUR NEGATIVE 05/16/2017 1814   PROTEINUR NEGATIVE 05/16/2017 1814   NITRITE NEGATIVE 05/16/2017 1814   LEUKOCYTESUR NEGATIVE 05/16/2017 1814   Urine Drug Screen     Component Value Date/Time   LABOPIA NONE DETECTED 05/06/2017 1011   COCAINSCRNUR NONE DETECTED 05/06/2017 1011   LABBENZ NONE DETECTED 05/06/2017 1011   AMPHETMU NONE DETECTED 05/06/2017 1011   THCU NONE DETECTED 05/06/2017 1011   LABBARB NONE DETECTED 05/06/2017 1011    Alcohol Level No results found for: [place] I have personally reviewed the radiological images below and agree with the radiology interpretations.  Ct Angio Head W Or Wo Contrast  Result Date: 05/06/2017 CLINICAL DATA:  77 y/o M; right-sided weakness, numbness, slurred speech. EXAM: CT ANGIOGRAPHY HEAD AND NECK TECHNIQUE: Multidetector CT imaging of the head and neck was performed using the standard protocol during bolus administration of intravenous contrast. Multiplanar CT image reconstructions and MIPs were obtained to evaluate the vascular anatomy. Carotid stenosis measurements (when applicable) are obtained utilizing NASCET criteria, using the distal internal carotid diameter as the denominator. CONTRAST:  70m ISOVUE-370 IOPAMIDOL (ISOVUE-370) INJECTION 76% COMPARISON:  None. FINDINGS: CTA NECK FINDINGS Aortic arch: Standard branching. Imaged portion shows no evidence of aneurysm or dissection. No significant stenosis of the major arch vessel origins. Right carotid system: No evidence of dissection, stenosis (50% or greater) or occlusion. Left carotid system: Patent left common carotid artery. Left ICA occlusion to the terminus where there is a short segment  of reconstitution of the terminal ICA and proximal left M1 via the anterior communicating artery. Vertebral arteries: Codominant. No evidence of dissection, stenosis (50% or greater) or occlusion. Skeleton: Moderate cervical spine spondylosis with multilevel discogenic degenerative changes greatest at the C4 through C7 levels or uncovertebral and facet hypertrophy encroach on neural foramen and there is mild canal stenosis. Other neck: Negative. Upper chest: Right port catheter in the SVC extending below field of view. Review of the MIP images confirms the above findings CTA HEAD FINDINGS Anterior circulation: Left mid M1 occlusion with poor left MCA distribution collateral circulation. Right distal M1 mild stenosis. No additional large vessel occlusion, aneurysm, or vascular malformation in the anterior circulation. Posterior circulation: No significant stenosis, proximal occlusion, aneurysm, or vascular malformation. Right P2 moderate stenosis and left P2  mild stenosis. Venous sinuses: As permitted by contrast timing, patent. Anatomic variants: Patent anterior and bilateral posterior communicating arteries. Review of the MIP images confirms the above findings IMPRESSION: 1. 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 Short segment of patency of left ICA terminus and proximal left M1 via anterior communicating artery. 2. Left mid M1 occlusion with poor left MCA collateralization. 3. Patent right carotid and bilateral vertebral systems. 4. Patent bilateral ACA, right MCA, and bilateral PCA distributions. Intracranial atherosclerosis with multiple segments of mild-to-moderate stenosis. These results were called by telephone at the time of interpretation on 05/06/2017 at 4:10 am to Dr. Cheral Marker, who verbally acknowledged these results. Electronically Signed   By: Kristine Garbe M.D.   On: 05/06/2017 04:17   Dg Chest 2 View  Result Date: 05/16/2017 CLINICAL DATA:  Cough, shortness of breath, chest pain for  2 days. EXAM: CHEST - 2 VIEW COMPARISON:  Chest x-rays dated 05/11/2017, 05/06/2017 and 03/18/2017. FINDINGS: Heart size and mediastinal contours are within normal limits. Right chest wall Port-A-Cath is stable in position with tip at the level of the mid/upper SVC. The left perihilar opacity is unchanged in the short-term interval. Right lung remains clear. No new lung findings. No pleural effusion or pneumothorax seen. IMPRESSION: 1. Ill-defined opacity within the left perihilar lung is unchanged in the short-term interval, at least mildly improved compared to the earlier chest x-ray of 05/06/2017. Earlier CT of 03/24/2017 showed an associated pulmonary mass in this area. As indicated on the earlier chest x-ray report, findings could represent associated atelectasis, postobstructive pneumonitis or local tumor spread. 2. No new lung findings. Electronically Signed   By: Franki Cabot M.D.   On: 05/16/2017 15:15   Dg Chest 2 View  Result Date: 05/11/2017 CLINICAL DATA:  Productive cough, chest pain and shortness of breath. EXAM: CHEST - 2 VIEW COMPARISON:  Chest radiograph May 08, 2016 FINDINGS: Cardiomediastinal silhouette is normal. LEFT perihilar consolidation and diffuse interstitial prominence. Improved aeration LEFT lung base with cyst small pleural effusions. No pneumothorax. Single lumen RIGHT chest Port-A-Cath distal tip projects in proximal superior vena cava. No pneumothorax. Osteopenia. IMPRESSION: Persistent LEFT perihilar consolidation, possible pneumonia. Improved aeration LEFT lung base. Small pleural effusions. Diffuse interstitial prominence seen with atypical infection, potentially chronic. Electronically Signed   By: Elon Alas M.D.   On: 05/11/2017 19:54   Ct Head Wo Contrast  Result Date: 05/18/2017 CLINICAL DATA:  77 y/o  M; intracranial hemorrhage for follow-up. EXAM: CT HEAD WITHOUT CONTRAST TECHNIQUE: Contiguous axial images were obtained from the base of the skull through  the vertex without intravenous contrast. COMPARISON:  05/17/2017 CT head FINDINGS: Brain: Stable hemorrhage centered in left basal ganglia measuring 4.3 x 2.7 x 3.2 cm (volume = 19 cm^3). Stable surrounding edema extending into the left frontal lobe, left temporal lobe, left insula, and left midbrain with associated local mass effect resulting in partial effacement of left lateral ventricle and 4 mm of left-to-right midline shift. Stable additional subcentimeter focus of hemorrhage within the left postcentral gyrus (series 3, image 28). No new intracranial hemorrhage, focal mass effect, or stroke identified. No extra-axial collection or effacement of basilar cisterns. Stable ventricle size. Vascular: Calcific atherosclerosis of carotid siphons. No hyperdense vessel identified. Skull: Normal. Negative for fracture or focal lesion. Sinuses/Orbits: No acute finding. Other: None. IMPRESSION: 1. Stable hematoma in left basal ganglia, 19 cc. Stable surrounding edema and associated mass effect with 4 mm left-to-right midline shift and partial effacement of left lateral ventricle.  Stable subcentimeter focus of hemorrhage in left postcentral gyrus. 2. No new acute intracranial abnormality identified. Electronically Signed   By: Kristine Garbe M.D.   On: 05/18/2017 05:49   Ct Head Wo Contrast  Addendum Date: 05/17/2017   ADDENDUM REPORT: 05/17/2017 13:48 ADDENDUM: Critical Value/emergent results were called by telephone at the time of interpretation on 05/17/2017 at 1327 hours to Dr. Reesa Chew , who verbally acknowledged these results. We discussed that the intracranial hemorrhage appears acute, but occurs in the area of left basal ganglia infarction seen on 05/06/2017. If related, this would be a Heidelberg type 2 Bleed given the mass effect. Although a period of 10-11 days seems atypical for hemorrhagic conversion or post therapy bleeding after cerebral infarct. The patient is reportedly still on Plavix and  heparin. I recommended she discuss with Neurology. Electronically Signed   By: Genevie Ann M.D.   On: 05/17/2017 13:48   Result Date: 05/17/2017 CLINICAL DATA:  77 year old male status post left ICA and MCA emergent large vessel occlusion on 05/06/2017 status post endovascular treatment. Worsening speech and weakness today. Initial encounter. EXAM: CT HEAD WITHOUT CONTRAST TECHNIQUE: Contiguous axial images were obtained from the base of the skull through the vertex without intravenous contrast. COMPARISON:  Post treatment brain MRI 05/06/2017 and earlier. FINDINGS: Brain: Lobulated hyperdense hemorrhage centered at the left basal ganglia area of infarct in February encompasses 43 x 27 x 32 millimeters (AP by transverse by CC) for an estimated intra-axial blood volume of 19 milliliters. There is surrounding edema. There is regional mass effect including rightward midline shift of 5 millimeters. Mass effect on the left lateral ventricle with no intraventricular or extra-axial extension of hemorrhage. Cerebral edema is superimposed on chronic bilateral white matter hypodensity. Edema tracks into the left thalamus and midbrain. The basilar cisterns remain normal. Below the midbrain the posterior fossa gray-white matter differentiation remains normal. No superimposed cytotoxic edema identified in the brain. Vascular: Calcified atherosclerosis at the skull base. Skull: No acute osseous abnormality identified. Sinuses/Orbits: Clear. Other: Leftward gaze deviation. Visualized scalp soft tissues are within normal limits. IMPRESSION: 1. Acute hemorrhage in the left basal ganglia. Estimated blood volume 19 mL. Surrounding edema, tracking through the left thalamus to the midbrain. Regional mass effect including partial effacement of the left lateral ventricle and rightward midline shift of 5 millimeters. 2. No intraventricular or extra-axial hemorrhage extension. No ventriculomegaly. 3. Underlying chronic small vessel disease.  Electronically Signed: By: Genevie Ann M.D. On: 05/17/2017 13:19   Ct Angio Neck W And/or Wo Contrast  Result Date: 05/06/2017 CLINICAL DATA:  77 y/o M; right-sided weakness, numbness, slurred speech. EXAM: CT ANGIOGRAPHY HEAD AND NECK TECHNIQUE: Multidetector CT imaging of the head and neck was performed using the standard protocol during bolus administration of intravenous contrast. Multiplanar CT image reconstructions and MIPs were obtained to evaluate the vascular anatomy. Carotid stenosis measurements (when applicable) are obtained utilizing NASCET criteria, using the distal internal carotid diameter as the denominator. CONTRAST:  24m ISOVUE-370 IOPAMIDOL (ISOVUE-370) INJECTION 76% COMPARISON:  None. FINDINGS: CTA NECK FINDINGS Aortic arch: Standard branching. Imaged portion shows no evidence of aneurysm or dissection. No significant stenosis of the major arch vessel origins. Right carotid system: No evidence of dissection, stenosis (50% or greater) or occlusion. Left carotid system: Patent left common carotid artery. Left ICA occlusion to the terminus where there is a short segment of reconstitution of the terminal ICA and proximal left M1 via the anterior communicating artery. Vertebral arteries: Codominant. No evidence  of dissection, stenosis (50% or greater) or occlusion. Skeleton: Moderate cervical spine spondylosis with multilevel discogenic degenerative changes greatest at the C4 through C7 levels or uncovertebral and facet hypertrophy encroach on neural foramen and there is mild canal stenosis. Other neck: Negative. Upper chest: Right port catheter in the SVC extending below field of view. Review of the MIP images confirms the above findings CTA HEAD FINDINGS Anterior circulation: Left mid M1 occlusion with poor left MCA distribution collateral circulation. Right distal M1 mild stenosis. No additional large vessel occlusion, aneurysm, or vascular malformation in the anterior circulation. Posterior  circulation: No significant stenosis, proximal occlusion, aneurysm, or vascular malformation. Right P2 moderate stenosis and left P2 mild stenosis. Venous sinuses: As permitted by contrast timing, patent. Anatomic variants: Patent anterior and bilateral posterior communicating arteries. Review of the MIP images confirms the above findings IMPRESSION: 1. 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 Short segment of patency of left ICA terminus and proximal left M1 via anterior communicating artery. 2. Left mid M1 occlusion with poor left MCA collateralization. 3. Patent right carotid and bilateral vertebral systems. 4. Patent bilateral ACA, right MCA, and bilateral PCA distributions. Intracranial atherosclerosis with multiple segments of mild-to-moderate stenosis. These results were called by telephone at the time of interpretation on 05/06/2017 at 4:10 am to Dr. Cheral Marker, who verbally acknowledged these results. Electronically Signed   By: Kristine Garbe M.D.   On: 05/06/2017 04:17   Mr Virgel Paling MW Contrast  Result Date: 05/06/2017 CLINICAL DATA:  77 year old male who presented with emergent large vessel occlusion of the left ICA status post endovascular reperfusion earlier today. Personal history of prior whole brain radiation for cancer, but no known brain metastases. EXAM: MRI HEAD WITHOUT CONTRAST MRA HEAD WITHOUT CONTRAST TECHNIQUE: Multiplanar, multiecho pulse sequences of the brain and surrounding structures were obtained without intravenous contrast. Angiographic images of the head were obtained using MRA technique without contrast. COMPARISON:  Cerebral angiogram, CTA head and neck, and noncontrast head CT earlier today. Prior brain MRI 11/19/2016. FINDINGS: MRI HEAD FINDINGS Brain: Restricted diffusion in the left basal ganglia (series 3, image 32) with T2 and FLAIR hyperintensity. No associated hemorrhage. No significant mass effect. There is a small 9 millimeter area of restricted diffusion in  the medial left temporal lobe at the amygdala (series 3, image 20). There are otherwise a small number of tiny scattered foci of restricted diffusion in the left MCA territory (such as series 3, image 40). There is no right hemisphere or posterior circulation restricted diffusion. Patchy and scattered bilateral cerebral white matter T2 and FLAIR hyperintensity elsewhere is chronic and stable. The right deep gray matter nuclei, left thalamus, brainstem, and cerebellum remain normal for age. No midline shift, mass effect, evidence of mass lesion, ventriculomegaly, extra-axial collection or acute intracranial hemorrhage. Cervicomedullary junction and pituitary are within normal limits. Vascular: Major intracranial vascular flow voids are preserved, the left ICA flow void was abnormal in 2018 and appears mildly improved today. Negative visible cervical spine.  Normal bone marrow signal. There is mild leftward gaze deviation. Orbits soft tissues otherwise appear normal. Paranasal Visualized paranasal sinuses and mastoids are stable and well pneumatized. Scalp and face soft tissues appear negative. MRA HEAD FINDINGS Antegrade flow in the posterior circulation with codominant distal vertebral arteries. Mild distal vertebral irregularity with no stenosis. Patent vertebrobasilar junction without stenosis. Dominant appearing AICA origins are patent. Mild basilar artery irregularity without stenosis. SCA origins are patent. Bilateral PCA origins appear normal. Posterior communicating arteries are  diminutive or absent. Bilateral PCA branches are mildly irregular, and there is mild right PCA P2 segment stenosis which is stable from earlier today. Antegrade flow in both ICA siphons. Mild luminal narrowing and irregularity throughout the visible left ICA may reflect residual thrombus or plaque. The left ICA is patent to the left terminus. The left MCA and ACA origins are patent. There is mild to moderate irregularity at the left  ACA origin and proximal A1 segment which is stable from earlier today. There is mild irregularity scratched at the left MCA and its branches now are patent. There is mild left M1 irregularity. The left MCA bifurcation is patent with no branch occlusion identified. Antegrade flow in the right ICA siphon which is only mildly irregular and without stenosis. Normal right ophthalmic artery origin. Normal right ICA terminus, right ACA and MCA origins. The anterior communicating artery is normal. The visible ACA branches are stable, with mild irregularity of the distal left ACA. The right MCA M1 segment is patent with mild irregularity just proximal to the bifurcation which is stable. The right MCA branches are stable and within normal limits. IMPRESSION: 1. Restored patency of the Left ICA, terminus, and Left MCA since earlier today. Mild residual irregularity throughout the left ICA siphon, and in the left M1 segment. Stable mild to moderate irregularity in the left A1 and distal A2 segments. 2. Associated core infarct primarily confined to the Left Basal Ganglia. There is a small 9 mm infarct in the left amygdala, and there are small number of scattered tiny cortical infarcts elsewhere in the Left MCA territory. 3. Mild cytotoxic edema with no associated hemorrhage or mass effect. 4. 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 Electronically Signed   By: Genevie Ann M.D.   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 Personal history of prior whole brain radiation for cancer, but no known brain metastases. EXAM: MRI HEAD WITHOUT CONTRAST MRA HEAD WITHOUT CONTRAST TECHNIQUE: Multiplanar, multiecho pulse sequences of the brain and surrounding structures were obtained without intravenous contrast.  Angiographic images of the head were obtained using MRA technique without contrast. COMPARISON:  Cerebral angiogram, CTA head and neck, and noncontrast head CT earlier today. Prior brain MRI 11/19/2016. FINDINGS: MRI HEAD FINDINGS Brain: Restricted diffusion in the left basal ganglia (series 3, image 32) with T2 and FLAIR hyperintensity. No associated hemorrhage. No significant mass effect. There is a small 9 millimeter area of restricted diffusion in the medial left temporal lobe at the amygdala (series 3, image 20). There are otherwise a small number of tiny scattered foci of restricted diffusion in the left MCA territory (such as series 3, image 40). There is no right hemisphere or posterior circulation restricted diffusion. Patchy and scattered bilateral cerebral white matter T2 and FLAIR hyperintensity elsewhere is chronic and stable. The right deep gray matter nuclei, left thalamus, brainstem, and cerebellum remain normal for age. No midline shift, mass effect, evidence of mass lesion, ventriculomegaly, extra-axial collection or acute intracranial hemorrhage. Cervicomedullary junction and pituitary are within normal limits. Vascular: Major intracranial vascular flow voids are preserved, the left ICA flow void was abnormal in 2018 and appears mildly improved today. Negative visible cervical spine.  Normal bone marrow signal. There is mild leftward gaze deviation. Orbits soft tissues otherwise appear normal. Paranasal Visualized  paranasal sinuses and mastoids are stable and well pneumatized. Scalp and face soft tissues appear negative. MRA HEAD FINDINGS Antegrade flow in the posterior circulation with codominant distal vertebral arteries. Mild distal vertebral irregularity with no stenosis. Patent vertebrobasilar junction without stenosis. Dominant appearing AICA origins are patent. Mild basilar artery irregularity without stenosis. SCA origins are patent. Bilateral PCA origins appear normal. Posterior  communicating arteries are diminutive or absent. Bilateral PCA branches are mildly irregular, and there is mild right PCA P2 segment stenosis which is stable from earlier today. Antegrade flow in both ICA siphons. Mild luminal narrowing and irregularity throughout the visible left ICA may reflect residual thrombus or plaque. The left ICA is patent to the left terminus. The left MCA and ACA origins are patent. There is mild to moderate irregularity at the left ACA origin and proximal A1 segment which is stable from earlier today. There is mild irregularity scratched at the left MCA and its branches now are patent. There is mild left M1 irregularity. The left MCA bifurcation is patent with no branch occlusion identified. Antegrade flow in the right ICA siphon which is only mildly irregular and without stenosis. Normal right ophthalmic artery origin. Normal right ICA terminus, right ACA and MCA origins. The anterior communicating artery is normal. The visible ACA branches are stable, with mild irregularity of the distal left ACA. The right MCA M1 segment is patent with mild irregularity just proximal to the bifurcation which is stable. The right MCA branches are stable and within normal limits. IMPRESSION: 1. Restored patency of the Left ICA, terminus, and Left MCA since earlier today. Mild residual irregularity throughout the left ICA siphon, and in the left M1 segment. Stable mild to moderate irregularity in the left A1 and distal A2 segments. 2. Associated core infarct primarily confined to the Left Basal Ganglia. There is a small 9 mm infarct in the left amygdala, and there are small number of scattered tiny cortical infarcts elsewhere in the Left MCA territory. 3. Mild cytotoxic edema with no associated hemorrhage or mass effect. 4. 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 Electronically Signed   By: Genevie Ann M.D.   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 The antibiotic was administered within 1 hour of the procedure 75 MCG NITROGLYCERIN INTRA ARTERIAL 650 mg aspirin via orogastric tube. Intra procedural initiation of Integrilin IV drip for dual anti-platelet therapy. ANESTHESIA/SEDATION: General endotracheal tube anesthesia with anesthesia team CONTRAST:  144 cc IV contrast FLUOROSCOPY TIME:  Fluoroscopy Time: 54 minutes 6 seconds (2,926 mGy). COMPLICATIONS: None TECHNIQUE: Informed written consent was obtained from the patient's family after a thorough discussion of the procedural risks, benefits and alternatives. Specific risks discussed include: Bleeding, infection, contrast reaction, kidney  injury/failure, need for further procedure/surgery, arterial injury or dissection, embolization to new territory, intracranial hemorrhage (10-15% risk), neurologic deterioration, cardiopulmonary collapse, death. All questions were addressed. Maximal Sterile Barrier Technique was utilized including during the procedure including caps, mask, sterile gowns, sterile gloves, sterile drape, hand hygiene and skin antiseptic. A timeout  was performed prior to the initiation of the procedure. FINDINGS: Initial angiogram: Left common carotid artery:  Normal course caliber and contour. Left external carotid artery: Patent with antegrade flow. On the initial injection before treatment, there is minimal opacification of the carotid siphon secondary to meningeal branches which appear to be region ating from ascending pharyngeal distribution and potentially deep meningeal branches from the external carotid artery. No significant filling of the MCA on the initial injection The initial injection does confirmed perfusion of the lenticulostriate vasculature, as well as a patent anterior coronal artery, with minimal filling of temporal lobe. Anterior basal ganglia structures are perfused. Left internal carotid artery: Internal carotid artery is occluded at the origin with a string sign at the posterior aspect of the artery. Left MCA: Initial injection demonstrates no significant filling of the middle cerebral artery. 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 Left ACA: Anterior cerebral artery perfused by the right-sided flow at the initiation. Final angiogram: After treatment of the carotid occlusion and deployment of 8 mm-6 mm x 40 mm carotid stent, there is excellent flow through the cervical carotid to the intracranial carotid. After mechanical thrombectomy of M1 occlusion, there is modified treatment in cerebral ischemia of 3. Flat panel CT: No complications with small contrast stain of the left basal ganglia. PROCEDURE: Patient was brought to the neurointerventional suite, with the patient identity confirmed, and consent form confirmed. General anesthesia was present for initiation of general endotracheal tube anesthesia. Patient was prepped and draped in the usual sterile fashion. The only venous access was a port catheter with single needle access. Ultrasound survey of the right inguinal  region was performed with images stored and sent to PACs. 11 blade scalpel was used to make a small incision. A micropuncture needle was used access the right common femoral artery under ultrasound. With excellent arterial blood flow returned, an .018 micro wire was passed through the needle, observed to enter the abdominal aorta under fluoroscopy. The needle was removed, and a micropuncture sheath was placed over the wire. The inner dilator and wire were removed, and an 035 Bentson wire was advanced under fluoroscopy into the abdominal aorta. The sheath was removed and a standard 5 [place] vascular sheath was placed. The dilator was removed and the sheath was flushed. After the initial common femoral artery access, ultrasound survey of the left inguinal region was performed in order to place arterial monitoring line. Images stored and sent to PACs. Micropuncture needle was used access the left common femoral artery under ultrasound. With excellent arterial blood flow returned, an .018 micro wire was passed through the needle, observed to enter the abdominal aorta under fluoroscopy. The needle was removed, and a micropuncture sheath was placed over the wire. The inner dilator and wire were removed, and an 035 Bentson wire was advanced under fluoroscopy into the abdominal aorta. The sheath was removed and a standard 4 [place] vascular sheath was placed. The dilator was removed and the sheath was flushed. A 20F JB-1 diagnostic catheter was advanced over the wire through the right common femoral artery access to the proximal descending thoracic aorta. Wire was then removed.  Double flush of the catheter was performed. Catheter was then used to select the left common carotid artery. Angiogram was performed. Using roadmap technique, the catheter was advanced over a roadrunner wire into right common carotid artery. Formal angiogram was performed. Catheter was advanced over the roadrunner wire into the external carotid artery.  Wire was removed. Exchange length Rosen wire was then passed through the diagnostic catheter to the distal common carotid artery and the diagnostic catheter was removed. The 5 French sheath was removed and exchanged for 8 French 55 centimeter BrightTip sheath. Sheath was flushed and attached to pressurized and heparinized saline bag for constant forward flow. Then an 8 [place], 95 cm Flowgate balloon tip catheter was prepared on the back table with inflation of the balloon with 50/50 concentration of dilute contrast. The balloon catheter was then advanced over the wire, positioned into the distal common carotid artery. Copious back flush was performed and the balloon catheter was attached to heparinized and pressurized saline bag for forward flow. Roadmap was then performed at the carotid bifurcation. Combination of a rapid transit microwire and a synchro soft wire were used to navigate beyond the occlusion at the proximal internal carotid artery. The microcatheter was then navigated into the distal cervical segment, wire was removed, blood was aspirated, and a gentle injection confirmed a luminal position. Exchange length Transcend wire was then placed through the microcatheter into the cervical carotid. Microcatheter was removed. Balloon angioplasty was then performed with a rapid exchange system, using a Viatrac balloon 5 mm x 30 mm. The patient required treatment at this time with atropine dose, as we experienced bradycardia into the 30s. His heart rate recovered after treatment by the anesthesia team. Balloon was removed. Angiogram confirmed flow into the cervical carotid. A coaxial system of an Ace 64 penumbra aspiration catheter and the Trevo pro view 18 catheter was then advanced over the exchange length wire into the cervical carotid. Once the coaxial system was in the cervical carotid, the microwire was removed and exchanged for synchro soft. The synchro soft wire was navigated beyond the occlusion under  roadmap guidance, and the aspiration catheter was advanced into the ICA terminus. The balloon guide catheter was then advanced into the mid segment of the cervical carotid artery. The microcatheter and microwire were then carefully advanced through the occluded segment. Microcatheter was then push through the occluded segment and the wire was removed. Blood was then aspirated through the hub of the microcatheter, and a gentle contrast injection was performed confirming intraluminal position. A rotating hemostatic valve was then attached to the back end of the microcatheter, and a pressurized and heparinized saline bag was attached to the catheter. 4 x 40 solitaire device was then selected. Back flush was achieved at the rotating hemostatic valve, and then the device was gently advanced through the microcatheter to the distal end. The retriever was then unsheathed by withdrawing the microcatheter under fluoroscopy. A 5 minutes interval was observed. At this time, ultrasound-guided access of the right common femoral vein was performed for placement of a additional venous access and placement of a 12 French triple-lumen IV catheter. We then proceeded with the mechanical thrombectomy. The balloon at the balloon tip catheter was then inflated under fluoroscopy for proximal flow arrest. Constant aspiration was then performed at the penumbra aspiration catheter at the carotid terminus as the retriever was gently and slowly withdrawn with fluoroscopic observation. Once the retriever was entirely removed from the system, free aspiration was confirmed at the hub  of the aspiration catheter, with free blood return confirmed. The balloon was then deflated, rotating hemostatic valve was reattached, and a control angiogram was performed, confirming restoration of flow. Penumbra catheter was then withdrawn, and the angiogram was performed at the carotid bifurcation. Given the appearance, we elected to place acute carotid stent. The  lesion was then again navigated with synchro soft wire and rapid transit. With the microwire microcatheter positioned in the distal cervical carotid, the microwire was removed. The exchange length Transcend wire was then replaced and the microcatheter was removed. Acute carotid stent was then deployed, 8 mm-6 mm x 40 mm length. Before deployment, we initiated Integrilin drip treatment for dual anti-platelet therapy. Angiogram was performed. Coaxial system of the penumbra aspiration catheter and the pro view 18 catheter were then navigated to the carotid siphon for better angiogram of the cerebral vasculature. Angiogram was performed. Microcatheter, a number aspiration catheter were withdrawn. The balloon guide catheter was positioned in the common carotid artery for final angiogram. All catheter and wires were removed. The sheath at the right common femoral artery access was withdrawn for an angiogram. Eight French Angio-Seal was then deployed for hemostasis. A flat panel CT was then performed.  Patient was then extubated. Estimated blood loss was 50 cc No complications encountered. IMPRESSION: Status post left-sided cerebral angiogram with treatment of left ICA/MCA tandem occlusion, requiring acute left carotid stenting and mechanical thrombectomy of a left M1 occlusion with achievement of mTICI-3 flow, and restoration of cervical ICA flow. Flat panel CT in the room demonstrates small contrast stain without complicating features, and the patient was extubated. Status post deployment of 8 French Angio-Seal for right common femoral artery hemostasis. Status post ultrasound-guided left common femoral artery access with a 4 French sheath for arterial monitoring, which remained after the case. Status post ultrasound-guided right common femoral vein access for additional IV access, which remained after the case transmitting the Integrilin drip. Signed, Dulcy Fanny. Earleen Newport, DO [facility]  [facility] Radiology PLAN: Patient will go to the PACU ICU admission Bilateral hips will be straight until the left 4 French arterial monitoring sheath is removed and the right common femoral vein venous access sheath is removed. Routine wound management for the right common femoral artery sheath access. Frequent neurovascular checks. IV saline hydration for 8 hours given the renal insufficiency Blood pressure control management of 120-140 with nicardipine drip ordered The goal will be dual anti-platelet therapy, for now with dose of 650 mg aspirin and the Integrilin drip. Electronically Signed   By: Corrie Mckusick D.O.   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 The antibiotic was administered within 1 hour of the procedure 75 MCG NITROGLYCERIN INTRA ARTERIAL 650 mg aspirin via orogastric tube.  Intra procedural initiation of Integrilin IV drip for dual anti-platelet therapy. ANESTHESIA/SEDATION: General endotracheal tube anesthesia with anesthesia team CONTRAST:  144 cc IV contrast FLUOROSCOPY TIME:  Fluoroscopy Time: 54 minutes 6 seconds (2,926  mGy). COMPLICATIONS: None TECHNIQUE: Informed written consent was obtained from the patient's family after a thorough discussion of the procedural risks, benefits and alternatives. Specific risks discussed include: Bleeding, infection, contrast reaction, kidney injury/failure, need for further procedure/surgery, arterial injury or dissection, embolization to new territory, intracranial hemorrhage (10-15% risk), neurologic deterioration, cardiopulmonary collapse, death. All questions were addressed. Maximal Sterile Barrier Technique was utilized including during the procedure including caps, mask, sterile gowns, sterile gloves, sterile drape, hand hygiene and skin antiseptic. A timeout was performed prior to the initiation of the procedure. FINDINGS: Initial angiogram: Left common carotid artery:  Normal course caliber and contour. Left external carotid artery: Patent with antegrade flow. On the initial injection before treatment, there is minimal opacification of the carotid siphon secondary to meningeal branches which appear to be region ating from ascending pharyngeal distribution and potentially deep meningeal branches from the external carotid artery. No significant filling of the MCA on the initial injection The initial injection does confirmed perfusion of the lenticulostriate vasculature, as well as a patent anterior coronal artery, with minimal filling of temporal lobe. Anterior basal ganglia structures are perfused. Left internal carotid artery: Internal carotid artery is occluded at the origin with a string sign at the posterior aspect of the artery. Left MCA: Initial injection demonstrates no significant filling of the middle cerebral artery. 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 Left ACA: Anterior cerebral artery perfused by the right-sided flow at the initiation. Final angiogram: After treatment of the carotid occlusion and deployment of 8  mm-6 mm x 40 mm carotid stent, there is excellent flow through the cervical carotid to the intracranial carotid. After mechanical thrombectomy of M1 occlusion, there is modified treatment in cerebral ischemia of 3. Flat panel CT: No complications with small contrast stain of the left basal ganglia. PROCEDURE: Patient was brought to the neurointerventional suite, with the patient identity confirmed, and consent form confirmed. General anesthesia was present for initiation of general endotracheal tube anesthesia. Patient was prepped and draped in the usual sterile fashion. The only venous access was a port catheter with single needle access. Ultrasound survey of the right inguinal region was performed with images stored and sent to PACs. 11 blade scalpel was used to make a small incision. A micropuncture needle was used access the right common femoral artery under ultrasound. With excellent arterial blood flow returned, an .018 micro wire was passed through the needle, observed to enter the abdominal aorta under fluoroscopy. The needle was removed, and a micropuncture sheath was placed over the wire. The inner dilator and wire were removed, and an 035 Bentson wire was advanced under fluoroscopy into the abdominal aorta. The sheath was removed and a standard 5 [place] vascular sheath was placed. The dilator was removed and the sheath was flushed. After the initial common femoral artery access, ultrasound survey of the left inguinal region was performed in order to place arterial monitoring line. Images stored and sent to PACs. Micropuncture needle was used access the left common femoral artery under ultrasound. With excellent arterial blood flow returned, an .018 micro wire was passed through the needle, observed to enter the abdominal aorta under fluoroscopy. The needle was removed, and a micropuncture sheath was placed over the wire. The  inner dilator and wire were removed, and an 035 Bentson wire was advanced under  fluoroscopy into the abdominal aorta. The sheath was removed and a standard 4 [place] vascular sheath was placed. The dilator was removed and the sheath was flushed. A 13F JB-1 diagnostic catheter was advanced over the wire through the right common femoral artery access to the proximal descending thoracic aorta. Wire was then removed. Double flush of the catheter was performed. Catheter was then used to select the left common carotid artery. Angiogram was performed. Using roadmap technique, the catheter was advanced over a roadrunner wire into right common carotid artery. Formal angiogram was performed. Catheter was advanced over the roadrunner wire into the external carotid artery. Wire was removed. Exchange length Rosen wire was then passed through the diagnostic catheter to the distal common carotid artery and the diagnostic catheter was removed. The 5 French sheath was removed and exchanged for 8 French 55 centimeter BrightTip sheath. Sheath was flushed and attached to pressurized and heparinized saline bag for constant forward flow. Then an 8 [place], 95 cm Flowgate balloon tip catheter was prepared on the back table with inflation of the balloon with 50/50 concentration of dilute contrast. The balloon catheter was then advanced over the wire, positioned into the distal common carotid artery. Copious back flush was performed and the balloon catheter was attached to heparinized and pressurized saline bag for forward flow. Roadmap was then performed at the carotid bifurcation. Combination of a rapid transit microwire and a synchro soft wire were used to navigate beyond the occlusion at the proximal internal carotid artery. The microcatheter was then navigated into the distal cervical segment, wire was removed, blood was aspirated, and a gentle injection confirmed a luminal position. Exchange length Transcend wire was then placed through the microcatheter into the cervical carotid. Microcatheter was removed. Balloon  angioplasty was then performed with a rapid exchange system, using a Viatrac balloon 5 mm x 30 mm. The patient required treatment at this time with atropine dose, as we experienced bradycardia into the 30s. His heart rate recovered after treatment by the anesthesia team. Balloon was removed. Angiogram confirmed flow into the cervical carotid. A coaxial system of an Ace 64 penumbra aspiration catheter and the Trevo pro view 18 catheter was then advanced over the exchange length wire into the cervical carotid. Once the coaxial system was in the cervical carotid, the microwire was removed and exchanged for synchro soft. The synchro soft wire was navigated beyond the occlusion under roadmap guidance, and the aspiration catheter was advanced into the ICA terminus. The balloon guide catheter was then advanced into the mid segment of the cervical carotid artery. The microcatheter and microwire were then carefully advanced through the occluded segment. Microcatheter was then push through the occluded segment and the wire was removed. Blood was then aspirated through the hub of the microcatheter, and a gentle contrast injection was performed confirming intraluminal position. A rotating hemostatic valve was then attached to the back end of the microcatheter, and a pressurized and heparinized saline bag was attached to the catheter. 4 x 40 solitaire device was then selected. Back flush was achieved at the rotating hemostatic valve, and then the device was gently advanced through the microcatheter to the distal end. The retriever was then unsheathed by withdrawing the microcatheter under fluoroscopy. A 5 minutes interval was observed. At this time, ultrasound-guided access of the right common femoral vein was performed for placement of a additional venous access and placement of a 12  French triple-lumen IV catheter. We then proceeded with the mechanical thrombectomy. The balloon at the balloon tip catheter was then inflated  under fluoroscopy for proximal flow arrest. Constant aspiration was then performed at the penumbra aspiration catheter at the carotid terminus as the retriever was gently and slowly withdrawn with fluoroscopic observation. Once the retriever was entirely removed from the system, free aspiration was confirmed at the hub of the aspiration catheter, with free blood return confirmed. The balloon was then deflated, rotating hemostatic valve was reattached, and a control angiogram was performed, confirming restoration of flow. Penumbra catheter was then withdrawn, and the angiogram was performed at the carotid bifurcation. Given the appearance, we elected to place acute carotid stent. The lesion was then again navigated with synchro soft wire and rapid transit. With the microwire microcatheter positioned in the distal cervical carotid, the microwire was removed. The exchange length Transcend wire was then replaced and the microcatheter was removed. Acute carotid stent was then deployed, 8 mm-6 mm x 40 mm length. Before deployment, we initiated Integrilin drip treatment for dual anti-platelet therapy. Angiogram was performed. Coaxial system of the penumbra aspiration catheter and the pro view 18 catheter were then navigated to the carotid siphon for better angiogram of the cerebral vasculature. Angiogram was performed. Microcatheter, a number aspiration catheter were withdrawn. The balloon guide catheter was positioned in the common carotid artery for final angiogram. All catheter and wires were removed. The sheath at the right common femoral artery access was withdrawn for an angiogram. Eight French Angio-Seal was then deployed for hemostasis. A flat panel CT was then performed.  Patient was then extubated. Estimated blood loss was 50 cc No complications encountered. IMPRESSION: Status post left-sided cerebral angiogram with treatment of left ICA/MCA tandem occlusion, requiring acute left carotid stenting and mechanical  thrombectomy of a left M1 occlusion with achievement of mTICI-3 flow, and restoration of cervical ICA flow. Flat panel CT in the room demonstrates small contrast stain without complicating features, and the patient was extubated. Status post deployment of 8 French Angio-Seal for right common femoral artery hemostasis. Status post ultrasound-guided left common femoral artery access with a 4 French sheath for arterial monitoring, which remained after the case. Status post ultrasound-guided right common femoral vein access for additional IV access, which remained after the case transmitting the Integrilin drip. Signed, Dulcy Fanny. Earleen Newport, DO [facility] Radiology PLAN: Patient will go to the PACU ICU admission Bilateral hips will be straight until the left 4 French arterial monitoring sheath is removed and the right common femoral vein venous access sheath is removed. Routine wound management for the right common femoral artery sheath access. Frequent neurovascular checks. IV saline hydration for 8 hours given the renal insufficiency Blood pressure control management of 120-140 with nicardipine drip ordered The goal will be dual anti-platelet therapy, for now with dose of 650 mg aspirin and the Integrilin drip. Electronically Signed   By: Corrie Mckusick D.O.   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 The antibiotic was administered within 1 hour of the procedure 75 MCG NITROGLYCERIN INTRA ARTERIAL 650 mg aspirin via orogastric tube. Intra procedural initiation of Integrilin IV drip for dual anti-platelet therapy. ANESTHESIA/SEDATION: General endotracheal tube anesthesia with anesthesia team CONTRAST:  144 cc IV contrast FLUOROSCOPY TIME:  Fluoroscopy Time: 54 minutes 6 seconds (2,926 mGy). COMPLICATIONS: None TECHNIQUE: Informed written consent was obtained from the patient's family after a thorough discussion of the procedural risks, benefits and alternatives. Specific risks discussed include: Bleeding, infection, contrast reaction, kidney injury/failure, need for further procedure/surgery, arterial injury or dissection, embolization to new territory, intracranial hemorrhage (10-15% risk), neurologic deterioration, cardiopulmonary collapse, death. All questions were addressed. Maximal Sterile Barrier Technique was utilized including during the procedure including caps, mask, sterile gowns, sterile gloves, sterile drape, hand hygiene and skin antiseptic. A timeout was performed prior to the initiation of the procedure. FINDINGS: Initial angiogram: Left common carotid artery:  Normal course caliber and contour. Left external carotid artery: Patent with antegrade flow. On the initial injection before treatment, there is minimal opacification of the carotid siphon secondary to meningeal branches which appear to be region ating from ascending pharyngeal distribution and potentially deep meningeal branches from the external carotid artery. No significant filling of the MCA on the initial injection The initial injection does confirmed  perfusion of the lenticulostriate vasculature, as well as a patent anterior coronal artery, with minimal filling of temporal lobe. Anterior basal ganglia structures are perfused. Left internal carotid artery: Internal carotid artery is occluded at the origin with a string sign at the posterior aspect of the artery. Left MCA: Initial injection demonstrates no significant filling of the middle cerebral artery. 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 Left ACA: Anterior cerebral artery perfused by the right-sided flow at the initiation. Final angiogram: After treatment of the carotid occlusion and deployment of 8 mm-6 mm x 40 mm carotid stent, there is excellent flow through the cervical carotid to the intracranial carotid. After mechanical thrombectomy of M1 occlusion, there is modified treatment in cerebral ischemia of 3. Flat panel CT: No complications with small contrast stain of the left basal ganglia. PROCEDURE: Patient was brought to the neurointerventional suite, with the patient identity confirmed, and consent form confirmed. General anesthesia was present for initiation of general endotracheal tube anesthesia. Patient was prepped and draped in the usual sterile fashion. The only venous access was a port catheter with single needle access. Ultrasound survey of the right inguinal region was performed with images stored and sent to PACs. 11 blade scalpel was used to make a small incision. A micropuncture needle was used access the right common femoral artery under ultrasound. With excellent arterial blood flow returned, an .018 micro wire was passed through the needle, observed to enter the abdominal aorta under fluoroscopy. The needle was removed, and a micropuncture sheath was placed over the wire. The inner dilator and wire were removed, and an 035 Bentson wire was advanced under fluoroscopy into the abdominal aorta. The sheath was removed and a  standard 5 [place] vascular sheath was placed. The dilator was removed and the sheath was flushed. After the initial common femoral artery access, ultrasound survey of the left  inguinal region was performed in order to place arterial monitoring line. Images stored and sent to PACs. Micropuncture needle was used access the left common femoral artery under ultrasound. With excellent arterial blood flow returned, an .018 micro wire was passed through the needle, observed to enter the abdominal aorta under fluoroscopy. The needle was removed, and a micropuncture sheath was placed over the wire. The inner dilator and wire were removed, and an 035 Bentson wire was advanced under fluoroscopy into the abdominal aorta. The sheath was removed and a standard 4 [place] vascular sheath was placed. The dilator was removed and the sheath was flushed. A 48F JB-1 diagnostic catheter was advanced over the wire through the right common femoral artery access to the proximal descending thoracic aorta. Wire was then removed. Double flush of the catheter was performed. Catheter was then used to select the left common carotid artery. Angiogram was performed. Using roadmap technique, the catheter was advanced over a roadrunner wire into right common carotid artery. Formal angiogram was performed. Catheter was advanced over the roadrunner wire into the external carotid artery. Wire was removed. Exchange length Rosen wire was then passed through the diagnostic catheter to the distal common carotid artery and the diagnostic catheter was removed. The 5 French sheath was removed and exchanged for 8 French 55 centimeter BrightTip sheath. Sheath was flushed and attached to pressurized and heparinized saline bag for constant forward flow. Then an 8 [place], 95 cm Flowgate balloon tip catheter was prepared on the back table with inflation of the balloon with 50/50 concentration of dilute contrast. The balloon catheter was then advanced over the wire,  positioned into the distal common carotid artery. Copious back flush was performed and the balloon catheter was attached to heparinized and pressurized saline bag for forward flow. Roadmap was then performed at the carotid bifurcation. Combination of a rapid transit microwire and a synchro soft wire were used to navigate beyond the occlusion at the proximal internal carotid artery. The microcatheter was then navigated into the distal cervical segment, wire was removed, blood was aspirated, and a gentle injection confirmed a luminal position. Exchange length Transcend wire was then placed through the microcatheter into the cervical carotid. Microcatheter was removed. Balloon angioplasty was then performed with a rapid exchange system, using a Viatrac balloon 5 mm x 30 mm. The patient required treatment at this time with atropine dose, as we experienced bradycardia into the 30s. His heart rate recovered after treatment by the anesthesia team. Balloon was removed. Angiogram confirmed flow into the cervical carotid. A coaxial system of an Ace 64 penumbra aspiration catheter and the Trevo pro view 18 catheter was then advanced over the exchange length wire into the cervical carotid. Once the coaxial system was in the cervical carotid, the microwire was removed and exchanged for synchro soft. The synchro soft wire was navigated beyond the occlusion under roadmap guidance, and the aspiration catheter was advanced into the ICA terminus. The balloon guide catheter was then advanced into the mid segment of the cervical carotid artery. The microcatheter and microwire were then carefully advanced through the occluded segment. Microcatheter was then push through the occluded segment and the wire was removed. Blood was then aspirated through the hub of the microcatheter, and a gentle contrast injection was performed confirming intraluminal position. A rotating hemostatic valve was then attached to the back end of the  microcatheter, and a pressurized and heparinized saline bag was attached to the catheter. 4 x 40 solitaire device was then  selected. Back flush was achieved at the rotating hemostatic valve, and then the device was gently advanced through the microcatheter to the distal end. The retriever was then unsheathed by withdrawing the microcatheter under fluoroscopy. A 5 minutes interval was observed. At this time, ultrasound-guided access of the right common femoral vein was performed for placement of a additional venous access and placement of a 12 French triple-lumen IV catheter. We then proceeded with the mechanical thrombectomy. The balloon at the balloon tip catheter was then inflated under fluoroscopy for proximal flow arrest. Constant aspiration was then performed at the penumbra aspiration catheter at the carotid terminus as the retriever was gently and slowly withdrawn with fluoroscopic observation. Once the retriever was entirely removed from the system, free aspiration was confirmed at the hub of the aspiration catheter, with free blood return confirmed. The balloon was then deflated, rotating hemostatic valve was reattached, and a control angiogram was performed, confirming restoration of flow. Penumbra catheter was then withdrawn, and the angiogram was performed at the carotid bifurcation. Given the appearance, we elected to place acute carotid stent. The lesion was then again navigated with synchro soft wire and rapid transit. With the microwire microcatheter positioned in the distal cervical carotid, the microwire was removed. The exchange length Transcend wire was then replaced and the microcatheter was removed. Acute carotid stent was then deployed, 8 mm-6 mm x 40 mm length. Before deployment, we initiated Integrilin drip treatment for dual anti-platelet therapy. Angiogram was performed. Coaxial system of the penumbra aspiration catheter and the pro view 18 catheter were then navigated to the carotid  siphon for better angiogram of the cerebral vasculature. Angiogram was performed. Microcatheter, a number aspiration catheter were withdrawn. The balloon guide catheter was positioned in the common carotid artery for final angiogram. All catheter and wires were removed. The sheath at the right common femoral artery access was withdrawn for an angiogram. Eight French Angio-Seal was then deployed for hemostasis. A flat panel CT was then performed.  Patient was then extubated. Estimated blood loss was 50 cc No complications encountered. IMPRESSION: Status post left-sided cerebral angiogram with treatment of left ICA/MCA tandem occlusion, requiring acute left carotid stenting and mechanical thrombectomy of a left M1 occlusion with achievement of mTICI-3 flow, and restoration of cervical ICA flow. Flat panel CT in the room demonstrates small contrast stain without complicating features, and the patient was extubated. Status post deployment of 8 French Angio-Seal for right common femoral artery hemostasis. Status post ultrasound-guided left common femoral artery access with a 4 French sheath for arterial monitoring, which remained after the case. Status post ultrasound-guided right common femoral vein access for additional IV access, which remained after the case transmitting the Integrilin drip. Signed, Dulcy Fanny. Earleen Newport, DO [facility] Radiology PLAN: Patient will go to the PACU ICU admission Bilateral hips will be straight until the left 4 French arterial monitoring sheath is removed and the right common femoral vein venous access sheath is removed. Routine wound management for the right common femoral artery sheath access. Frequent neurovascular checks. IV saline hydration for 8 hours given the renal insufficiency Blood pressure control management of 120-140 with nicardipine drip ordered The goal will be dual anti-platelet therapy, for now with dose of 650 mg aspirin and  the Integrilin drip. Electronically Signed   By: Corrie Mckusick D.O.   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 The antibiotic was administered within 1 hour of the procedure 75 MCG NITROGLYCERIN INTRA ARTERIAL 650 mg aspirin via orogastric tube. Intra procedural initiation of Integrilin IV drip for dual anti-platelet therapy. ANESTHESIA/SEDATION: General endotracheal tube anesthesia with anesthesia team CONTRAST:  144 cc IV contrast FLUOROSCOPY TIME:  Fluoroscopy Time: 54 minutes 6 seconds (2,926 mGy). COMPLICATIONS: None TECHNIQUE: Informed written consent was obtained from the patient's family after a thorough discussion of the procedural risks, benefits and alternatives. Specific risks discussed include: Bleeding, infection, contrast reaction, kidney injury/failure, need for further procedure/surgery, arterial injury or dissection, embolization to new territory, intracranial hemorrhage (10-15% risk), neurologic deterioration, cardiopulmonary collapse, death. All questions were addressed. Maximal Sterile Barrier Technique was utilized including during the  procedure including caps, mask, sterile gowns, sterile gloves, sterile drape, hand hygiene and skin antiseptic. A timeout was performed prior to the initiation of the procedure. FINDINGS: Initial angiogram: Left common carotid artery:  Normal course caliber and contour. Left external carotid artery: Patent with antegrade flow. On the initial injection before treatment, there is minimal opacification of the carotid siphon secondary to meningeal branches which appear to be region ating from ascending pharyngeal distribution and potentially deep meningeal branches from the external carotid artery. No significant filling of the MCA on the initial injection The initial injection does confirmed perfusion of the lenticulostriate vasculature, as well as a patent anterior coronal artery, with minimal filling of temporal lobe. Anterior basal ganglia structures are perfused. Left internal carotid artery: Internal carotid artery is occluded at the origin with a string sign at the posterior aspect of the artery. Left MCA: Initial injection demonstrates no significant filling of the middle cerebral artery. 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 Left ACA: Anterior cerebral artery perfused by the right-sided flow at the initiation. Final angiogram: After treatment of the carotid occlusion and deployment of 8 mm-6 mm x 40 mm carotid stent, there is excellent flow through the cervical carotid to the intracranial carotid. After mechanical thrombectomy of M1 occlusion, there is modified treatment in cerebral ischemia of 3. Flat panel CT: No complications with small contrast stain of the left basal ganglia. PROCEDURE: Patient was brought to the neurointerventional suite, with the patient identity confirmed, and consent form confirmed. General anesthesia was present for initiation of general endotracheal tube anesthesia. Patient was prepped and draped in the usual sterile  fashion. The only venous access was a port catheter with single needle access. Ultrasound survey of the right inguinal region was performed with images stored and sent to PACs. 11 blade scalpel was used to make a small incision. A micropuncture needle was used access the right common femoral artery under ultrasound. With excellent arterial blood flow returned, an .018 micro wire was passed through the needle, observed to enter the abdominal aorta under fluoroscopy. The needle  was removed, and a micropuncture sheath was placed over the wire. The inner dilator and wire were removed, and an 035 Bentson wire was advanced under fluoroscopy into the abdominal aorta. The sheath was removed and a standard 5 [place] vascular sheath was placed. The dilator was removed and the sheath was flushed. After the initial common femoral artery access, ultrasound survey of the left inguinal region was performed in order to place arterial monitoring line. Images stored and sent to PACs. Micropuncture needle was used access the left common femoral artery under ultrasound. With excellent arterial blood flow returned, an .018 micro wire was passed through the needle, observed to enter the abdominal aorta under fluoroscopy. The needle was removed, and a micropuncture sheath was placed over the wire. The inner dilator and wire were removed, and an 035 Bentson wire was advanced under fluoroscopy into the abdominal aorta. The sheath was removed and a standard 4 [place] vascular sheath was placed. The dilator was removed and the sheath was flushed. A 25F JB-1 diagnostic catheter was advanced over the wire through the right common femoral artery access to the proximal descending thoracic aorta. Wire was then removed. Double flush of the catheter was performed. Catheter was then used to select the left common carotid artery. Angiogram was performed. Using roadmap technique, the catheter was advanced over a roadrunner wire into right common carotid  artery. Formal angiogram was performed. Catheter was advanced over the roadrunner wire into the external carotid artery. Wire was removed. Exchange length Rosen wire was then passed through the diagnostic catheter to the distal common carotid artery and the diagnostic catheter was removed. The 5 French sheath was removed and exchanged for 8 French 55 centimeter BrightTip sheath. Sheath was flushed and attached to pressurized and heparinized saline bag for constant forward flow. Then an 8 [place], 95 cm Flowgate balloon tip catheter was prepared on the back table with inflation of the balloon with 50/50 concentration of dilute contrast. The balloon catheter was then advanced over the wire, positioned into the distal common carotid artery. Copious back flush was performed and the balloon catheter was attached to heparinized and pressurized saline bag for forward flow. Roadmap was then performed at the carotid bifurcation. Combination of a rapid transit microwire and a synchro soft wire were used to navigate beyond the occlusion at the proximal internal carotid artery. The microcatheter was then navigated into the distal cervical segment, wire was removed, blood was aspirated, and a gentle injection confirmed a luminal position. Exchange length Transcend wire was then placed through the microcatheter into the cervical carotid. Microcatheter was removed. Balloon angioplasty was then performed with a rapid exchange system, using a Viatrac balloon 5 mm x 30 mm. The patient required treatment at this time with atropine dose, as we experienced bradycardia into the 30s. His heart rate recovered after treatment by the anesthesia team. Balloon was removed. Angiogram confirmed flow into the cervical carotid. A coaxial system of an Ace 64 penumbra aspiration catheter and the Trevo pro view 18 catheter was then advanced over the exchange length wire into the cervical carotid. Once the coaxial system was in the cervical carotid,  the microwire was removed and exchanged for synchro soft. The synchro soft wire was navigated beyond the occlusion under roadmap guidance, and the aspiration catheter was advanced into the ICA terminus. The balloon guide catheter was then advanced into the mid segment of the cervical carotid artery. The microcatheter and microwire were then carefully advanced through the occluded segment. Microcatheter  was then push through the occluded segment and the wire was removed. Blood was then aspirated through the hub of the microcatheter, and a gentle contrast injection was performed confirming intraluminal position. A rotating hemostatic valve was then attached to the back end of the microcatheter, and a pressurized and heparinized saline bag was attached to the catheter. 4 x 40 solitaire device was then selected. Back flush was achieved at the rotating hemostatic valve, and then the device was gently advanced through the microcatheter to the distal end. The retriever was then unsheathed by withdrawing the microcatheter under fluoroscopy. A 5 minutes interval was observed. At this time, ultrasound-guided access of the right common femoral vein was performed for placement of a additional venous access and placement of a 12 French triple-lumen IV catheter. We then proceeded with the mechanical thrombectomy. The balloon at the balloon tip catheter was then inflated under fluoroscopy for proximal flow arrest. Constant aspiration was then performed at the penumbra aspiration catheter at the carotid terminus as the retriever was gently and slowly withdrawn with fluoroscopic observation. Once the retriever was entirely removed from the system, free aspiration was confirmed at the hub of the aspiration catheter, with free blood return confirmed. The balloon was then deflated, rotating hemostatic valve was reattached, and a control angiogram was performed, confirming restoration of flow. Penumbra catheter was then withdrawn, and  the angiogram was performed at the carotid bifurcation. Given the appearance, we elected to place acute carotid stent. The lesion was then again navigated with synchro soft wire and rapid transit. With the microwire microcatheter positioned in the distal cervical carotid, the microwire was removed. The exchange length Transcend wire was then replaced and the microcatheter was removed. Acute carotid stent was then deployed, 8 mm-6 mm x 40 mm length. Before deployment, we initiated Integrilin drip treatment for dual anti-platelet therapy. Angiogram was performed. Coaxial system of the penumbra aspiration catheter and the pro view 18 catheter were then navigated to the carotid siphon for better angiogram of the cerebral vasculature. Angiogram was performed. Microcatheter, a number aspiration catheter were withdrawn. The balloon guide catheter was positioned in the common carotid artery for final angiogram. All catheter and wires were removed. The sheath at the right common femoral artery access was withdrawn for an angiogram. Eight French Angio-Seal was then deployed for hemostasis. A flat panel CT was then performed.  Patient was then extubated. Estimated blood loss was 50 cc No complications encountered. IMPRESSION: Status post left-sided cerebral angiogram with treatment of left ICA/MCA tandem occlusion, requiring acute left carotid stenting and mechanical thrombectomy of a left M1 occlusion with achievement of mTICI-3 flow, and restoration of cervical ICA flow. Flat panel CT in the room demonstrates small contrast stain without complicating features, and the patient was extubated. Status post deployment of 8 French Angio-Seal for right common femoral artery hemostasis. Status post ultrasound-guided left common femoral artery access with a 4 French sheath for arterial monitoring, which remained after the case. Status post ultrasound-guided right common femoral vein access for additional IV access, which remained  after the case transmitting the Integrilin drip. Signed, Dulcy Fanny. Earleen Newport, DO [facility] Radiology PLAN: Patient will go to the PACU ICU admission Bilateral hips will be straight until the left 4 French arterial monitoring sheath is removed and the right common femoral vein venous access sheath is removed. Routine wound management for the right common femoral artery sheath access. Frequent neurovascular checks. IV saline hydration for 8 hours given the  renal insufficiency Blood pressure control management of 120-140 with nicardipine drip ordered The goal will be dual anti-platelet therapy, for now with dose of 650 mg aspirin and the Integrilin drip. Electronically Signed   By: Corrie Mckusick D.O.   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 The antibiotic was administered within 1 hour of the procedure 75 MCG NITROGLYCERIN INTRA ARTERIAL 650 mg aspirin via orogastric tube. Intra procedural initiation of Integrilin IV drip for dual anti-platelet therapy. ANESTHESIA/SEDATION: General endotracheal tube anesthesia with  anesthesia team CONTRAST:  144 cc IV contrast FLUOROSCOPY TIME:  Fluoroscopy Time: 54 minutes 6 seconds (2,926 mGy). COMPLICATIONS: None TECHNIQUE: Informed written consent was obtained from the patient's family after a thorough discussion of the procedural risks, benefits and alternatives. Specific risks discussed include: Bleeding, infection, contrast reaction, kidney injury/failure, need for further procedure/surgery, arterial injury or dissection, embolization to new territory, intracranial hemorrhage (10-15% risk), neurologic deterioration, cardiopulmonary collapse, death. All questions were addressed. Maximal Sterile Barrier Technique was utilized including during the procedure including caps, mask, sterile gowns, sterile gloves, sterile drape, hand hygiene and skin antiseptic. A timeout was performed prior to the initiation of the procedure. FINDINGS: Initial angiogram: Left common carotid artery:  Normal course caliber and contour. Left external carotid artery: Patent with antegrade flow. On the initial injection before treatment, there is minimal opacification of the carotid siphon secondary to meningeal branches which appear to be region ating from ascending pharyngeal distribution and potentially deep meningeal branches from the external carotid artery. No significant filling of the MCA on the initial injection The initial injection does confirmed perfusion of the lenticulostriate vasculature, as well as a patent anterior coronal artery, with minimal filling of temporal lobe. Anterior basal ganglia structures are perfused. Left internal carotid artery: Internal carotid artery is occluded at the origin with a string sign at the posterior aspect of the artery. Left MCA: Initial injection demonstrates no significant filling of the middle cerebral artery. 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 Left ACA: Anterior cerebral artery perfused by the  right-sided flow at the initiation. Final angiogram: After treatment of the carotid occlusion and deployment of 8 mm-6 mm x 40 mm carotid stent, there is excellent flow through the cervical carotid to the intracranial carotid. After mechanical thrombectomy of M1 occlusion, there is modified treatment in cerebral ischemia of 3. Flat panel CT: No complications with small contrast stain of the left basal ganglia. PROCEDURE: Patient was brought to the neurointerventional suite, with the patient identity confirmed, and consent form confirmed. General anesthesia was present for initiation of general endotracheal tube anesthesia. Patient was prepped and draped in the usual sterile fashion. The only venous access was a port catheter with single needle  access. Ultrasound survey of the right inguinal region was performed with images stored and sent to PACs. 11 blade scalpel was used to make a small incision. A micropuncture needle was used access the right common femoral artery under ultrasound. With excellent arterial blood flow returned, an .018 micro wire was passed through the needle, observed to enter the abdominal aorta under fluoroscopy. The needle was removed, and a micropuncture sheath was placed over the wire. The inner dilator and wire were removed, and an 035 Bentson wire was advanced under fluoroscopy into the abdominal aorta. The sheath was removed and a standard 5 [place] vascular sheath was placed. The dilator was removed and the sheath was flushed. After the initial common femoral artery access, ultrasound survey of the left inguinal region was performed in order to place arterial monitoring line. Images stored and sent to PACs. Micropuncture needle was used access the left common femoral artery under ultrasound. With excellent arterial blood flow returned, an .018 micro wire was passed through the needle, observed to enter the abdominal aorta under fluoroscopy. The needle was removed, and a micropuncture  sheath was placed over the wire. The inner dilator and wire were removed, and an 035 Bentson wire was advanced under fluoroscopy into the abdominal aorta. The sheath was removed and a standard 4 [place] vascular sheath was placed. The dilator was removed and the sheath was flushed. A 5F JB-1 diagnostic catheter was advanced over the wire through the right common femoral artery access to the proximal descending thoracic aorta. Wire was then removed. Double flush of the catheter was performed. Catheter was then used to select the left common carotid artery. Angiogram was performed. Using roadmap technique, the catheter was advanced over a roadrunner wire into right common carotid artery. Formal angiogram was performed. Catheter was advanced over the roadrunner wire into the external carotid artery. Wire was removed. Exchange length Rosen wire was then passed through the diagnostic catheter to the distal common carotid artery and the diagnostic catheter was removed. The 5 French sheath was removed and exchanged for 8 French 55 centimeter BrightTip sheath. Sheath was flushed and attached to pressurized and heparinized saline bag for constant forward flow. Then an 8 [place], 95 cm Flowgate balloon tip catheter was prepared on the back table with inflation of the balloon with 50/50 concentration of dilute contrast. The balloon catheter was then advanced over the wire, positioned into the distal common carotid artery. Copious back flush was performed and the balloon catheter was attached to heparinized and pressurized saline bag for forward flow. Roadmap was then performed at the carotid bifurcation. Combination of a rapid transit microwire and a synchro soft wire were used to navigate beyond the occlusion at the proximal internal carotid artery. The microcatheter was then navigated into the distal cervical segment, wire was removed, blood was aspirated, and a gentle injection confirmed a luminal position. Exchange length  Transcend wire was then placed through the microcatheter into the cervical carotid. Microcatheter was removed. Balloon angioplasty was then performed with a rapid exchange system, using a Viatrac balloon 5 mm x 30 mm. The patient required treatment at this time with atropine dose, as we experienced bradycardia into the 30s. His heart rate recovered after treatment by the anesthesia team. Balloon was removed. Angiogram confirmed flow into the cervical carotid. A coaxial system of an Ace 64 penumbra aspiration catheter and the Trevo pro view 18 catheter was then advanced over the exchange length wire into the cervical carotid. Once the coaxial system  was in the cervical carotid, the microwire was removed and exchanged for synchro soft. The synchro soft wire was navigated beyond the occlusion under roadmap guidance, and the aspiration catheter was advanced into the ICA terminus. The balloon guide catheter was then advanced into the mid segment of the cervical carotid artery. The microcatheter and microwire were then carefully advanced through the occluded segment. Microcatheter was then push through the occluded segment and the wire was removed. Blood was then aspirated through the hub of the microcatheter, and a gentle contrast injection was performed confirming intraluminal position. A rotating hemostatic valve was then attached to the back end of the microcatheter, and a pressurized and heparinized saline bag was attached to the catheter. 4 x 40 solitaire device was then selected. Back flush was achieved at the rotating hemostatic valve, and then the device was gently advanced through the microcatheter to the distal end. The retriever was then unsheathed by withdrawing the microcatheter under fluoroscopy. A 5 minutes interval was observed. At this time, ultrasound-guided access of the right common femoral vein was performed for placement of a additional venous access and placement of a 12 French triple-lumen IV  catheter. We then proceeded with the mechanical thrombectomy. The balloon at the balloon tip catheter was then inflated under fluoroscopy for proximal flow arrest. Constant aspiration was then performed at the penumbra aspiration catheter at the carotid terminus as the retriever was gently and slowly withdrawn with fluoroscopic observation. Once the retriever was entirely removed from the system, free aspiration was confirmed at the hub of the aspiration catheter, with free blood return confirmed. The balloon was then deflated, rotating hemostatic valve was reattached, and a control angiogram was performed, confirming restoration of flow. Penumbra catheter was then withdrawn, and the angiogram was performed at the carotid bifurcation. Given the appearance, we elected to place acute carotid stent. The lesion was then again navigated with synchro soft wire and rapid transit. With the microwire microcatheter positioned in the distal cervical carotid, the microwire was removed. The exchange length Transcend wire was then replaced and the microcatheter was removed. Acute carotid stent was then deployed, 8 mm-6 mm x 40 mm length. Before deployment, we initiated Integrilin drip treatment for dual anti-platelet therapy. Angiogram was performed. Coaxial system of the penumbra aspiration catheter and the pro view 18 catheter were then navigated to the carotid siphon for better angiogram of the cerebral vasculature. Angiogram was performed. Microcatheter, a number aspiration catheter were withdrawn. The balloon guide catheter was positioned in the common carotid artery for final angiogram. All catheter and wires were removed. The sheath at the right common femoral artery access was withdrawn for an angiogram. Eight French Angio-Seal was then deployed for hemostasis. A flat panel CT was then performed.  Patient was then extubated. Estimated blood loss was 50 cc No complications encountered. IMPRESSION: Status post left-sided  cerebral angiogram with treatment of left ICA/MCA tandem occlusion, requiring acute left carotid stenting and mechanical thrombectomy of a left M1 occlusion with achievement of mTICI-3 flow, and restoration of cervical ICA flow. Flat panel CT in the room demonstrates small contrast stain without complicating features, and the patient was extubated. Status post deployment of 8 French Angio-Seal for right common femoral artery hemostasis. Status post ultrasound-guided left common femoral artery access with a 4 French sheath for arterial monitoring, which remained after the case. Status post ultrasound-guided right common femoral vein access for additional IV access, which remained after the case transmitting the Integrilin drip. Signed, Dulcy Fanny. Earleen Newport,  DO [facility] Radiology PLAN: Patient will go to the PACU ICU admission Bilateral hips will be straight until the left 4 French arterial monitoring sheath is removed and the right common femoral vein venous access sheath is removed. Routine wound management for the right common femoral artery sheath access. Frequent neurovascular checks. IV saline hydration for 8 hours given the renal insufficiency Blood pressure control management of 120-140 with nicardipine drip ordered The goal will be dual anti-platelet therapy, for now with dose of 650 mg aspirin and the Integrilin drip. Electronically Signed   By: Corrie Mckusick D.O.   On: 05/06/2017 09:24   Chest Port 1 View  Result Date: 05/18/2017 CLINICAL DATA:  Pneumonia. EXAM: PORTABLE CHEST 1 VIEW COMPARISON:  One-view chest x-ray 05/16/2017 FINDINGS: Superior segment left lower lobe or left upper lobe airspace disease has increased since the prior exam. No significant right-sided consolidation is present. Mild hilar prominence is noted. A right IJ Port-A-Cath is stable. A left pleural effusion is suspected. IMPRESSION: 1. Progressive superior segment left lower lobe or  left upper lobe airspace disease compatible with pneumonia. 2. Small left pleural effusion and basilar airspace disease, likely atelectasis. Electronically Signed   By: San Morelle M.D.   On: 05/18/2017 09:20   Dg Chest Port 1 View  Result Date: 05/08/2017 CLINICAL DATA:  Abnormal chest x-ray. EXAM: PORTABLE CHEST 1 VIEW COMPARISON:  Radiograph of May 06, 2017. FINDINGS: Stable cardiomegaly. Right internal jugular Port-A-Cath is unchanged in position. No pneumothorax is noted. Mild right midlung subsegmental atelectasis is noted. 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 Bony thorax is unremarkable. IMPRESSION: Stable left perihilar and basilar opacities are noted as described above. Mild right midlung subsegmental atelectasis. Electronically Signed   By: Marijo Conception, M.D.   On: 05/08/2017 15:38   Dg Chest Port 1 View  Result Date: 05/06/2017 CLINICAL DATA:  Shortness of breath. Patient status post left MCA thrombectomy and left ICA stent placement today. EXAM: PORTABLE CHEST 1 VIEW COMPARISON:  PA and lateral chest 03/18/2017.  CT chest 03/24/2017. FINDINGS: Interstitial prominence about the patient's left upper lobe pulmonary mass is worse than on the prior plain films of the chest. Mild basilar atelectasis is noted. Heart size is normal. Port-A-Cath is in place. Aortic atherosclerosis is seen. IMPRESSION: Increased interstitial opacities about the patient's left upper lobe pulmonary mass are nonspecific and could be due to tumor spread, postobstructive pneumonitis or less likely hemorrhage. Atherosclerosis. Electronically Signed   By: Inge Rise M.D.   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 The antibiotic was administered within 1 hour of the procedure 75 [place]  650 mg aspirin via orogastric tube. Intra procedural initiation of Integrilin IV drip for dual anti-platelet therapy. ANESTHESIA/SEDATION: General endotracheal tube anesthesia with anesthesia team CONTRAST:  144 cc IV contrast FLUOROSCOPY TIME:  Fluoroscopy Time: 54 minutes 6 seconds (2,926 mGy). COMPLICATIONS: None TECHNIQUE: Informed written consent was obtained from the patient's family after a thorough discussion of the procedural risks, benefits and alternatives. Specific risks discussed include: Bleeding, infection, contrast reaction, kidney injury/failure, need for further procedure/surgery, arterial injury or dissection, embolization to new territory, intracranial hemorrhage (10-15% risk), neurologic deterioration, cardiopulmonary collapse, death. All questions were addressed. Maximal Sterile Barrier Technique was utilized including during the procedure including caps, mask, sterile gowns, sterile gloves, sterile drape, hand hygiene and skin antiseptic. A timeout was performed prior to the initiation of the procedure. FINDINGS: Initial angiogram: Left common carotid artery:  Normal course caliber and  contour. Left external carotid artery: Patent with antegrade flow. On the initial injection before treatment, there is minimal opacification of the carotid siphon secondary to meningeal branches which appear to be region ating from ascending pharyngeal distribution and potentially deep meningeal branches from the external carotid artery. No significant filling of the MCA on the initial injection The initial injection does confirmed perfusion of the lenticulostriate vasculature, as well as a patent anterior coronal artery, with minimal filling of temporal lobe. Anterior basal ganglia structures are perfused. Left internal carotid artery: Internal carotid artery is occluded at the origin with a string sign at the posterior aspect of the artery. Left MCA: Initial injection demonstrates no significant filling of the middle cerebral artery. 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 Left ACA: Anterior cerebral artery perfused by the right-sided flow at the initiation. Final angiogram: After treatment of the carotid occlusion and deployment of 8 mm-6 mm x 40 mm carotid stent, there is excellent flow through the cervical carotid to the intracranial carotid. After mechanical thrombectomy of M1 occlusion, there is modified treatment in cerebral ischemia of 3. Flat panel CT: No complications with small contrast stain of the left basal ganglia. PROCEDURE: Patient was brought to the neurointerventional suite, with the patient identity confirmed, and consent form confirmed. General anesthesia was present for initiation of general endotracheal tube anesthesia. Patient was prepped and draped in the usual sterile fashion. The only venous access was a port catheter with single needle access. Ultrasound survey of the right inguinal region was performed with images stored and sent to PACs. 11 blade scalpel was used to make a small incision. A micropuncture needle was used  access the right common femoral artery under ultrasound. With excellent arterial blood flow returned, an .018 micro wire was passed through the needle, observed to enter the abdominal aorta under fluoroscopy. The needle was removed, and a micropuncture sheath was placed over the wire. The inner dilator and wire were removed, and an 035 Bentson wire was advanced under fluoroscopy into the abdominal aorta. The sheath was removed and a standard 5 [place] vascular sheath was placed. The dilator was removed and the sheath was flushed. After the initial common femoral artery access, ultrasound survey of the left inguinal region was performed in order to place arterial monitoring line. Images stored and sent to PACs. Micropuncture needle was used access the left common femoral artery under ultrasound. With excellent arterial blood flow returned, an .018 micro wire was passed through the needle, observed to enter the abdominal aorta under fluoroscopy. The needle was removed, and a micropuncture sheath  was placed over the wire. The inner dilator and wire were removed, and an 035 Bentson wire was advanced under fluoroscopy into the abdominal aorta. The sheath was removed and a standard 4 [place] vascular sheath was placed. The dilator was removed and the sheath was flushed. A 81F JB-1 diagnostic catheter was advanced over the wire through the right common femoral artery access to the proximal descending thoracic aorta. Wire was then removed. Double flush of the catheter was performed. Catheter was then used to select the left common carotid artery. Angiogram was performed. Using roadmap technique, the catheter was advanced over a roadrunner wire into right common carotid artery. Formal angiogram was performed. Catheter was advanced over the roadrunner wire into the external carotid artery. Wire was removed. Exchange length Rosen wire was then passed through the diagnostic catheter to the distal common carotid artery and the  diagnostic catheter was removed. The 5 French sheath was removed and exchanged for 8 French 55 centimeter BrightTip sheath. Sheath was flushed and attached to pressurized and heparinized saline bag for constant forward flow. Then an 8 [place], 95 cm Flowgate balloon tip catheter was prepared on the back table with inflation of the balloon with 50/50 concentration of dilute contrast. The balloon catheter was then advanced over the wire, positioned into the distal common carotid artery. Copious back flush was performed and the balloon catheter was attached to heparinized and pressurized saline bag for forward flow. Roadmap was then performed at the carotid bifurcation. Combination of a rapid transit microwire and a synchro soft wire were used to navigate beyond the occlusion at the proximal internal carotid artery. The microcatheter was then navigated into the distal cervical segment, wire was removed, blood was aspirated, and a gentle injection confirmed a luminal position. Exchange length Transcend wire was then placed through the microcatheter into the cervical carotid. Microcatheter was removed. Balloon angioplasty was then performed with a rapid exchange system, using a Viatrac balloon 5 mm x 30 mm. The patient required treatment at this time with atropine dose, as we experienced bradycardia into the 30s. His heart rate recovered after treatment by the anesthesia team. Balloon was removed. Angiogram confirmed flow into the cervical carotid. A coaxial system of an Ace 64 penumbra aspiration catheter and the Trevo pro view 18 catheter was then advanced over the exchange length wire into the cervical carotid. Once the coaxial system was in the cervical carotid, the microwire was removed and exchanged for synchro soft. The synchro soft wire was navigated beyond the occlusion under roadmap guidance, and the aspiration catheter was advanced into the ICA terminus. The balloon guide catheter was then advanced into the mid  segment of the cervical carotid artery. The microcatheter and microwire were then carefully advanced through the occluded segment. Microcatheter was then push through the occluded segment and the wire was removed. Blood was then aspirated through the hub of the microcatheter, and a gentle contrast injection was performed confirming intraluminal position. A rotating hemostatic valve was then attached to the back end of the microcatheter, and a pressurized and heparinized saline bag was attached to the catheter. 4 x 40 solitaire device was then selected. Back flush was achieved at the rotating hemostatic valve, and then the device was gently advanced through the microcatheter to the distal end. The retriever was then unsheathed by withdrawing the microcatheter under fluoroscopy. A 5 minutes interval was observed. At this time, ultrasound-guided access of the right common femoral vein was performed for placement of a additional venous  access and placement of a 12 French triple-lumen IV catheter. We then proceeded with the mechanical thrombectomy. The balloon at the balloon tip catheter was then inflated under fluoroscopy for proximal flow arrest. Constant aspiration was then performed at the penumbra aspiration catheter at the carotid terminus as the retriever was gently and slowly withdrawn with fluoroscopic observation. Once the retriever was entirely removed from the system, free aspiration was confirmed at the hub of the aspiration catheter, with free blood return confirmed. The balloon was then deflated, rotating hemostatic valve was reattached, and a control angiogram was performed, confirming restoration of flow. Penumbra catheter was then withdrawn, and the angiogram was performed at the carotid bifurcation. Given the appearance, we elected to place acute carotid stent. The lesion was then again navigated with synchro soft wire and rapid transit. With the microwire microcatheter positioned in the distal  cervical carotid, the microwire was removed. The exchange length Transcend wire was then replaced and the microcatheter was removed. Acute carotid stent was then deployed, 8 mm-6 mm x 40 mm length. Before deployment, we initiated Integrilin drip treatment for dual anti-platelet therapy. Angiogram was performed. Coaxial system of the penumbra aspiration catheter and the pro view 18 catheter were then navigated to the carotid siphon for better angiogram of the cerebral vasculature. Angiogram was performed. Microcatheter, a number aspiration catheter were withdrawn. The balloon guide catheter was positioned in the common carotid artery for final angiogram. All catheter and wires were removed. The sheath at the right common femoral artery access was withdrawn for an angiogram. Eight French Angio-Seal was then deployed for hemostasis. A flat panel CT was then performed.  Patient was then extubated. Estimated blood loss was 50 cc No complications encountered. IMPRESSION: Status post left-sided cerebral angiogram with treatment of left ICA/MCA tandem occlusion, requiring acute left carotid stenting and mechanical thrombectomy of a left M1 occlusion with achievement of mTICI-3 flow, and restoration of cervical ICA flow. Flat panel CT in the room demonstrates small contrast stain without complicating features, and the patient was extubated. Status post deployment of 8 French Angio-Seal for right common femoral artery hemostasis. Status post ultrasound-guided left common femoral artery access with a 4 French sheath for arterial monitoring, which remained after the case. Status post ultrasound-guided right common femoral vein access for additional IV access, which remained after the case transmitting the Integrilin drip. Signed, Dulcy Fanny. Earleen Newport, DO [facility] Radiology PLAN: Patient will go to the PACU ICU admission Bilateral hips will be straight until the left 4 French  arterial monitoring sheath is removed and the right common femoral vein venous access sheath is removed. Routine wound management for the right common femoral artery sheath access. Frequent neurovascular checks. IV saline hydration for 8 hours given the renal insufficiency Blood pressure control management of 120-140 with nicardipine drip ordered The goal will be dual anti-platelet therapy, for now with dose of 650 mg aspirin and the Integrilin drip. Electronically Signed   By: Corrie Mckusick D.O.   On: 05/06/2017 09:24   Ct Head Code Stroke Wo Contrast  Result Date: 05/06/2017 CLINICAL DATA:  Code stroke. 77 y/o M; right-sided weakness and numbness with slurred speech. EXAM: CT HEAD WITHOUT CONTRAST TECHNIQUE: Contiguous axial images were obtained from the base of the skull through the vertex without intravenous contrast. COMPARISON:  11/19/2016 MRI of the head. FINDINGS: Brain: No evidence of acute infarction, hemorrhage, hydrocephalus, extra-axial collection or mass lesion/mass effect. Stable moderate chronic microvascular ischemic changes and parenchymal  volume loss of the brain. Vascular: Calcific atherosclerosis of carotid siphons. Dense left M1 (series 5, image 30). Skull: Normal. Negative for fracture or focal lesion. Sinuses/Orbits: No acute finding. Other: None. ASPECTS [facility] Stroke Program Early CT Score) - Ganglionic level infarction (caudate, lentiform nuclei, internal capsule, insula, M1-M3 cortex): 7 - Supraganglionic infarction (M4-M6 cortex): 3 Total score (0-10 with 10 being normal): 10 IMPRESSION: 1. No acute stroke, hemorrhage, or mass effect identified. 2. Dense left M1, possible thrombus. 3. ASPECTS is 10 4. Stable chronic microvascular ischemic changes and parenchymal volume loss of the brain given differences in technique. These results were communicated to Dr. Cheral Marker at 3:41 amon 2/28/2019by text page via the [facility] messaging system. Electronically Signed   By: Kristine Garbe  M.D.   On: 05/06/2017 03:43   EEG  This awake and asleep EEG is abnormal due to focal slowing over the left temporal region. Clinical Correlation of the above findings indicates focal cerebral dysfunction over the left temporal region suggestive of underlying structural or physiologic abnormality. The absence of epileptiform discharges does not exclude a clinical diagnosis of epilepsy. Clinical correlation is advised.      HISTORY OF PRESENT ILLNESS Cosimo Schertzer is a 77 y.o. 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 No tPA given due to concern of brain metastasis.CTA showed a L ICA and L M1 occlusion. He was taken to IR where he had revascularization of the L ICA occlusion w/ angioplasty and L ICA stent, and mechanical thrombectomy of L M2 occlusion. Extubated post procedure. Follow up MRI/MRA brain showed restored patency of L-ICA and L-MCA, mild cytotoxic edema and left basal ganglia infarct, small infarct in left amygdala and small number of scatterd tiny cortical infarcts in L-MCA territory. He had problems with hypotension after ICA stenting was placed on Neo-Synephrine drip as well as albumin and stress dose steroids. He was put on oral midodrine and florinef for BP support. Discussed with his oncologist and felt stroke was not related to hypercoagulable state but likely cardioembolic source therefore TEE done on 05/10/17 and showed dilated left atrium but negative for thrombus, NO PFO/ASD and no source of cardiac or aortic emboli. He was started on ASA/Plavix for embolic stroke and loop recorder recommended to rule out A fib. Blood sugars have been poorly controlled and low dose Lantus recommended for tighter control. Patient with resultant left sided weakness with shuffling gait with need for rest breaks, cognitive deficits and  difficulty with ADL tasks. CIR recommended and pt admitted to CIR on 05/11/17.  Pt has been doing well with rehab for the last week, weakness gradually improved and able to walk with PT 450 feet with minimal assist on 05/15/08. However, yesterday, he did not feel well, with fever 101, CXR concerning for infection and he was put on levaquin. Today pt no fever but very lethargic with AMS, essentially non verbal, but able to tell his name. Pt completely off his baseline for the last week, which promoted CT stat which showed hemorrhagic conversion of left BG infarct with 37m midline shift. Neurology called and felt pt needs to transfer back to inpt service with ICU monitoring for hemorrhagic conversion. BP at 150s, stopped midodrine and florinef and gave labetalol PRN with goal < 140. Need to repeat CT head in am.    HOSPITAL COURSE Mr. MLymon Kidneyis  a 77 y.o. male with PMH ofrecently diagnosed small cell lung cancer stage IIIa undergoing chemo and radiation, COPD, DM, HLD, HTN admittedon 2/28/19for L ICA and L M2occlusion s/pL ICA angioplastyandstent, and mechanical thrombectomy of L M2 occlusion. MRI/MRA brain showed restored patency of L-ICA and L-MCA, mild cytotoxic edema and left basal ganglia infarct, small infarct in left amygdala and small number of scatterd tiny cortical infarcts in L-MCA territory. He had problems with hypotension after ICA stenting wasput onoral midodrine and florinef for BP support. TEEUnremarkableand loop recorder recommended to rule out A fib.Pt admitted to CIR on 05/11/17 and has been doing well with rehab for the last week. However, had fever 101 yesterday, put on levaquin. Today pt lethargic with AMS, essentially non verbal, CT stat which showed hemorrhagic conversion of left BG infarct with 19m midline shift.  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 Yes; Fluid consistency: Thin   aspirin 81 mg daily prior to admission, was on aspirin 325 mg daily and integrelin. ASA and plavix on hold after hemorrhagic conversion, now restarted ASA given stable hematoma. Recommend restart plavix for ICA stent on 05/24/17 if neurologically stable.  Ongoing aggressive stroke risk factor management  Therapy recommendations:  CIR   Disposition:  Pending  Small cell lung cancer   11/2016 brain MRI no brain metastasis  On chemo and radiation  Follows with [facility]  Discussed with Dr. BRogue Bussing pt primary oncologist, no concern of hypercoagulable state at this time  Diabetes  HgbA1c 9.1 goal < 7.0  Glucose stable  CBG monitoring  SSI  DM coordinator consult   Hypertension with hypotension after stent  Was on florinef and midodrine  Now off florinef and midodrine   BP stable  Close monitoring  Hyperlipidemia  Home meds:  none   LDL 69, goal < 70  Statin allergy listed in file  CKD  Cre 1.98->1.80->1.88->1.82->1.88->1.87  At baseline   On gentle IVF  Dysphagia   Passed swallow  Decrease IVF  Speech to follow   Other Stroke Risk Factors  Advanced  age  Former smoker - quit 13 years ago  Other Active Problems  Anemia due to CKD - Hb 11.3->12.0->10.1->10.5->10.2   DISCHARGE EXAM Blood pressure (!) 146/69, pulse 70, temperature 98.5 F (36.9 C), temperature source Oral, resp. rate 18, height _0  (1.905 m), weight 207 lb 3.7 oz (94 kg), SpO2 98 %.  General- well nourished, well developed, in no apparent distress.   Ophthalmologic - fundi not visualized due to noncooperation.   Cardiovascular -regular rate  and rhythm  Neuro-Eyes open, awake, lethargic, only able to tell me his last name and not other orientation questions.Very limited sound output, anomia, but able to repeat simple sentences with moderate to severe dysarthria. Does not following commands but able to pantomime gestures. PERRL, EOMI, blinking to visual threat bilaterally, right facial droop, tongue midline in mouth. BUE 4/5 proximal and 3/5 distally, BLE 3/5 proximal and 3+/5 distally. DTR 1+ and no babinski.Sensation, coordination and gait not tested.   Discharge Diet  Fall precautions DIET - DYS 1 Room service appropriate? Yes; Fluid consistency: Thin liquids  DISCHARGE PLAN  Disposition:  Transfer to [place] for ongoing PT, OT and ST  aspirin 325 mg daily for secondary stroke prevention.  Recommend ongoing risk factor control by Primary Care Physician at time of discharge from inpatient rehabilitation.  Follow-up Marguerita Merles, MD in 2 weeks following discharge from rehab.  Follow-up with Dr. Antony Contras, Stroke Clinic in 6 weeks, office to schedule an appointment.   40 minutes were spent preparing discharge.  Rosalin Hawking, MD PhD Stroke Neurology 05/20/2017 4:20 PM

## 2017-05-20 NOTE — Progress Notes (Signed)
Physical Medicine and Rehabilitation Consult Reason for Consult:Stroke rehabilitation Referring Phsyician: [name] is an 77 y.o. male.   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  He did not receive TPA secondary to concerns of potential brain metastasis.  Patient was evaluated by neurology, MRI demonstrating left M1 occlusion and patient underwent mechanical thrombectomy and ICA stenting.  Conversations between oncology and neurology suggested that the stroke was not related to hypercoagulable state but likely cardioembolic source and therefore TEE was ordered after transthoracic echocardiogram did not demonstrate embolic source.  Patient was on aspirin 81 mg prior to admission, placed on Integrilin but this was switched to Plavix.  Patient had problems with hypotension after ICA stenting was placed on Neo-Synephrine drip and then switched to oral midodrine.  Patient is a diabetic and he has been started on Tradjenta as well as Glucotrol. Physical therapy evaluation 05/07/2017, mod assist with transfers mod assist 15 feet with rolling walker. Speech therapy evaluation 05/06/2017, regular diet with thin liquids recommended small sips with upright seating.  Language evaluation by speech therapy on 05/06/2017 demonstrated moderate expressive aphasia with poor ability to self-correct Review of Systems - Review of Systems  Constitutional: Negative for chills and fever.  HENT: Negative for nosebleeds.   Eyes: Negative for blurred vision, discharge and redness.  Respiratory: Negative for cough, shortness of breath, wheezing and stridor.   Cardiovascular: Negative for chest pain, palpitations and leg swelling.  Gastrointestinal: Positive for constipation. Negative for abdominal pain, nausea and vomiting.  Genitourinary: Negative for frequency and urgency.    Musculoskeletal: Negative for back pain, joint pain and neck pain.  Skin: Negative for itching and rash.  Neurological: Positive for speech change, focal weakness and weakness. Negative for headaches.  Endo/Heme/Allergies: Does not bruise/bleed easily.  Psychiatric/Behavioral: The patient is not nervous/anxious.         Past Medical History:  Diagnosis Date  . Cancer ([place])   . COPD (chronic obstructive pulmonary disease) ([place])   . Diabetes mellitus without complication ([place])   . GERD without esophagitis   . Hepatitis C virus    history of hep C treated with Harvoni  . Hypercholesterolemia   . Hypertension   . Seasonal allergies         Past Surgical History:  Procedure Laterality Date  . COLONOSCOPY     approximately 11 years ago  . FLEXIBLE BRONCHOSCOPY N/A 11/05/2016   Procedure: FLEXIBLE BRONCHOSCOPY;  Surgeon: Wilhelmina Mcardle, MD;  Location: [facility] ORS;  Service: Pulmonary;  Laterality: N/A;  . IR FLUORO GUIDE CV LINE RIGHT  05/06/2017  . IR INTRAVSC STENT CERV CAROTID W/O EMB-PROT MOD SED INC ANGIO  05/06/2017  . IR PERCUTANEOUS ART THROMBECTOMY/INFUSION INTRACRANIAL INC DIAG ANGIO  05/06/2017  . IR US GUIDE VASC ACCESS LEFT  05/06/2017  . IR US GUIDE VASC ACCESS RIGHT  05/06/2017  . IR US GUIDE VASC ACCESS RIGHT  05/06/2017  . PORTA CATH INSERTION N/A 11/23/2016   Procedure: Glori Luis Cath Insertion;  Surgeon: Algernon Huxley, MD;  Location: [facility];  Service: Cardiovascular;  Laterality: N/A;  . RADIOLOGY WITH ANESTHESIA N/A 05/06/2017   Procedure: IR WITH ANESTHESIA;  Surgeon: Radiologist, Medication, MD;  Location: [facility];  Service: Radiology;  Laterality: N/A;  . TONSILLECTOMY          Family History  Problem Relation Age of Onset  .  Skin cancer Mother   . Ovarian cancer Sister        sister was diagnosed as a teenager  . Throat cancer Brother 30       brother #1  . Lung cancer Sister 12  . Throat cancer Brother 30       brother  #2  . Skin cancer Sister    Social History:  reports that he quit smoking about 13 years ago. His smoking use included cigarettes. He has a 50.00 pack-year smoking history. he has never used smokeless tobacco. He reports that he does not drink alcohol or use drugs. Allergies:       Allergies  Allergen Reactions  . Penicillin G Anaphylaxis    Other reaction(s): Other (See Comments) Couldn't breath among other things Has patient had a PCN reaction causing immediate rash, facial/tongue/throat swelling, SOB or lightheadedness with hypotension: Yes Has patient had a PCN reaction causing severe rash involving mucus membranes or skin necrosis: No Has patient had a PCN reaction that required hospitalization: Yes Has patient had a PCN reaction occurring within the last 10 years: No If all of the above answers are "NO", then may proceed with Ce  . Shellfish-Derived Products Anaphylaxis  . Statins     Other reaction(s): Other (See Comments) Muscle spasms - can't walk - couldn't turn over in bed  . Erythromycin Itching    All mycins  . Tamsulosin     Other reaction(s): Dizziness  . Tuberculin Ppd     Other reaction(s): Other (See Comments) False test   . Iodinated Diagnostic Agents Hives         Medications Prior to Admission  Medication Sig Dispense Refill  . albuterol (PROVENTIL HFA;VENTOLIN HFA) 108 (90 Base) MCG/ACT inhaler Inhale 2 puffs into the lungs every 6 (six) hours as needed for wheezing or shortness of breath. 1 Inhaler 2  . APPLE CIDER VINEGAR PO Take 450 mg by mouth at bedtime.     Marland Kitchen aspirin EC 81 MG tablet Take 81 mg by mouth at bedtime.     . cetirizine (ZYRTEC) 10 MG tablet Take 10 mg by mouth daily.    . cholecalciferol (VITAMIN D) 1000 units tablet Take 1,000 Units by mouth daily.    . Coenzyme Q10 100 MG capsule Take 100 mg by mouth daily.     . Flaxseed, Linseed, (FLAXSEED OIL) 1000 MG CAPS Take 1,000 mg by mouth at bedtime.     .  fluticasone (FLONASE) 50 MCG/ACT nasal spray Place 1 spray into both nostrils daily as needed for allergies or rhinitis.    . Fluticasone-Salmeterol (ADVAIR DISKUS) 500-50 MCG/DOSE AEPB Inhale 1 puff into the lungs 2 (two) times daily. 1 each 3  . glipiZIDE (GLUCOTROL) 10 MG tablet Take 10 mg by mouth daily. Time release capsule    . glucagon (GLUCAGON EMERGENCY) 1 MG injection Inject 1 mg into the muscle once as needed. When blood glucose drops less than 50; and if patient has difficulty drinking. 1 each 12  . GNP GARLIC EXTRACT PO Take 2,130 mg by mouth daily.     . hydrochlorothiazide (HYDRODIURIL) 25 MG tablet Take 25 mg by mouth daily.    Marland Kitchen linagliptin (TRADJENTA) 5 MG TABS tablet Take 5 mg by mouth daily.    Marland Kitchen lisinopril (PRINIVIL,ZESTRIL) 10 MG tablet Take 10 mg by mouth daily.    . methocarbamol (ROBAXIN) 500 MG tablet Take 1 tablet (500 mg total) by mouth every 8 (eight) hours as needed for  muscle spasms. 60 tablet 0  . Multiple Vitamin (MULTIVITAMIN WITH MINERALS) TABS tablet Take 1 tablet by mouth daily.    Marland Kitchen omeprazole (PRILOSEC) 20 MG capsule Take 20 mg by mouth every Monday, Wednesday, and Friday.     . simethicone (MYLICON) 80 MG chewable tablet Chew 80 mg by mouth as needed for flatulence.     . vitamin C (ASCORBIC ACID) 250 MG tablet Take 250 mg by mouth daily.    Marland Kitchen zinc sulfate 220 (50 Zn) MG capsule Take 220 mg by mouth daily.    Marland Kitchen dexamethasone (DECADRON) 4 MG tablet Take 1 tablet (4 mg total) by mouth daily. Starting on the first day of radiation (Patient not taking: Reported on 05/06/2017) 25 tablet 0  . lidocaine-prilocaine (EMLA) cream Apply 1 application topically as needed. (Patient not taking: Reported on 05/06/2017) 30 g 3  . ondansetron (ZOFRAN) 8 MG tablet Take 1 tablet (8 mg total) by mouth every 8 (eight) hours as needed for nausea or vomiting (start 3 days; after chemo). (Patient not taking: Reported on 03/25/2017) 40 tablet 1  . prochlorperazine  (COMPAZINE) 10 MG tablet Take 1 tablet (10 mg total) by mouth every 6 (six) hours as needed for nausea or vomiting. (Patient not taking: Reported on 03/25/2017) 40 tablet 1  . ZINC-VITAMIN C MT Take 1 tablet daily by mouth.      Home: Home Living Family/patient expects to be discharged to:: Private residence Living Arrangements: Spouse/significant other Available Help at Discharge: Family, Available 24 hours/day Type of Home: House Home Access: Stairs to enter [employer] of Steps: 2 Entrance Stairs-Rails: Right, Left Home Layout: One level Bathroom Shower/Tub: Multimedia programmer: Standard Home Equipment: None  Functional History: Prior Function Comments: does all his ADL's and drives Functional Status:  Mobility: Ambulation/Gait Ambulation Distance (Feet): 15 Feet General Gait Details: paretic gait with small tentative steps and need for stability and maneuvering of the RW  ADL:  Cognition: Cognition Overall Cognitive Status: Difficult to assess Arousal/Alertness: Awake/alert Orientation Level: Oriented to person, Oriented to place, Oriented to situation Attention: Sustained Sustained Attention: Appears intact Cognition Arousal/Alertness: Awake/alert Behavior During Therapy: WFL for tasks assessed/performed Overall Cognitive Status: Difficult to assess Difficult to assess due to: Impaired communication  Blood pressure 140/74, pulse 69, temperature 98.4 F (36.9 C), resp. rate 17, height _0  (1.905 m), weight 96.2 kg (212 lb 1.3 oz), SpO2 99 %. Physical Exam  General: No acute distress Mood and affect are appropriate Heart: Regular rate and rhythm no rubs murmurs or extra sounds Lungs: Clear to auscultation, breathing unlabored, no rales or wheezes Abdomen: Positive bowel sounds, soft nontender to palpation, nondistended Extremities: No clubbing, cyanosis, or edema Skin: No evidence of breakdown, no evidence of rash, RIght upper chest  Port A Cath CDI Neurologic: Cranial nerves II through XII intact, motor strength is 5/5 in left deltoid, bicep, tricep, grip, hip flexor, knee extensors, ankle dorsiflexor and plantar flexor 3-/5 right deltoid bicep tricep grip, 4- in the right hip flexor knee extensor ankle dorsiflexor Sensory exam normal sensation to light touch and proprioception in bilateral upper and lower extremities Dual fields are intact to confrontation testing Musculoskeletal: Full range of motion in all 4 extremities. 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 Does the need for close, 24 hr/day medical supervision in concert with the patient's rehab needs make it unreasonable for  this patient to be served in a less intensive setting? Yes 2. Co-Morbidities requiring supervision/potential complications: Stage III small cell lung CA, diabetes, COPD completed chemo with ongoing radiation therapy 3. Due to bladder management, bowel management, safety, skin/wound care, disease management, medication administration, pain management and patient education, does the patient require 24 hr/day rehab nursing? Yes 4. Does the patient require coordinated care of a physician, rehab nurse, PT (1-2 hrs/day, 5 days/week), OT (1-2 hrs/day, 5 days/week) and SLP (.5-1 hrs/day, 5 days/week) to address physical and functional deficits in the context of the above medical diagnosis(es)? Yes Addressing deficits in the following areas: balance, endurance, locomotion, strength, transferring, bowel/bladder control, bathing, dressing, feeding, grooming, toileting, cognition, speech, language, swallowing and psychosocial support 5. Can the patient actively participate in an intensive therapy program of at least 3 hrs of therapy per day at least 5 days per week? Yes 6. The potential for  patient to make measurable gains while on inpatient rehab is excellent 7. Anticipated functional outcomes upon discharge from inpatients are Sup PT, Sup OT, ModISLP 8. Estimated rehab length of stay to reach the above functional goals is: 12-16d 9. Does the patient have adequate social supports to accommodate these discharge functional goals? Yes and Potentially 10. Anticipated D/C setting: Home 11. Anticipated post D/C treatments: [facility] therapy 12. Overall Rehab/Functional Prognosis: good  RECOMMENDATIONS: This patient's condition is appropriate for continued rehabilitative care in the following setting: CIR Patient has agreed to participate in recommended program. Yes Note that insurance prior authorization may be required for reimbursement for recommended care.  Comment: Scheduled for for TEE in a.m.   Edwin Jones 05/09/2017           Routing History

## 2017-05-21 ENCOUNTER — Inpatient Hospital Stay (HOSPITAL_COMMUNITY): Payer: Medicare Other | Admitting: Physical Therapy

## 2017-05-21 ENCOUNTER — Inpatient Hospital Stay (HOSPITAL_COMMUNITY): Payer: Medicare Other | Admitting: Speech Pathology

## 2017-05-21 ENCOUNTER — Inpatient Hospital Stay (HOSPITAL_COMMUNITY): Payer: Medicare Other | Admitting: Occupational Therapy

## 2017-05-21 LAB — COMPREHENSIVE METABOLIC PANEL
ALT: 12 U/L — AB (ref 17–63)
ANION GAP: 11 (ref 5–15)
AST: 13 U/L — ABNORMAL LOW (ref 15–41)
Albumin: 2.8 g/dL — ABNORMAL LOW (ref 3.5–5.0)
Alkaline Phosphatase: 69 U/L (ref 38–126)
BUN: 20 mg/dL (ref 6–20)
CHLORIDE: 102 mmol/L (ref 101–111)
CO2: 21 mmol/L — AB (ref 22–32)
CREATININE: 1.75 mg/dL — AB (ref 0.61–1.24)
Calcium: 8.9 mg/dL (ref 8.9–10.3)
GFR calc non Af Amer: 36 mL/min — ABNORMAL LOW (ref >60)
GFR, EST AFRICAN AMERICAN: 42 mL/min — AB (ref >60)
Glucose, Bld: 198 mg/dL — ABNORMAL HIGH (ref 65–99)
POTASSIUM: 4.5 mmol/L (ref 3.5–5.1)
SODIUM: 134 mmol/L — AB (ref 135–145)
Total Bilirubin: 0.4 mg/dL (ref 0.3–1.2)
Total Protein: 6.7 g/dL (ref 6.5–8.1)

## 2017-05-21 LAB — CBC WITH DIFFERENTIAL/PLATELET
Basophils Absolute: 0 10*3/uL (ref 0.0–0.1)
Basophils Relative: 1 %
EOS ABS: 0.2 10*3/uL (ref 0.0–0.7)
EOS PCT: 3 %
HCT: 34.2 % — ABNORMAL LOW (ref 39.0–52.0)
Hemoglobin: 11 g/dL — ABNORMAL LOW (ref 13.0–17.0)
LYMPHS ABS: 1 10*3/uL (ref 0.7–4.0)
Lymphocytes Relative: 19 %
MCH: 30.6 pg (ref 26.0–34.0)
MCHC: 32.2 g/dL (ref 30.0–36.0)
MCV: 95.3 fL (ref 78.0–100.0)
Monocytes Absolute: 0.6 10*3/uL (ref 0.1–1.0)
Monocytes Relative: 11 %
Neutro Abs: 3.3 10*3/uL (ref 1.7–7.7)
Neutrophils Relative %: 66 %
PLATELETS: 310 10*3/uL (ref 150–400)
RBC: 3.59 MIL/uL — ABNORMAL LOW (ref 4.22–5.81)
RDW: 13 % (ref 11.5–15.5)
WBC: 5 10*3/uL (ref 4.0–10.5)

## 2017-05-21 LAB — GLUCOSE, CAPILLARY
GLUCOSE-CAPILLARY: 155 mg/dL — AB (ref 65–99)
Glucose-Capillary: 203 mg/dL — ABNORMAL HIGH (ref 65–99)
Glucose-Capillary: 203 mg/dL — ABNORMAL HIGH (ref 65–99)
Glucose-Capillary: 189 mg/dL — ABNORMAL HIGH (ref 65–99)

## 2017-05-21 LAB — CULTURE, BLOOD (SINGLE)
CULTURE: NO GROWTH
Special Requests: ADEQUATE

## 2017-05-21 MED ORDER — HEPARIN SOD (PORK) LOCK FLUSH 100 UNIT/ML IV SOLN
500.0000 [IU] | INTRAVENOUS | Status: AC | PRN
  Administered 2017-05-21: 500 [IU]

## 2017-05-21 MED ORDER — ASPIRIN 81 MG PO CHEW
324.0000 mg | CHEWABLE_TABLET | Freq: Every day | ORAL | Status: DC
  Administered 2017-05-22 – 2017-06-15 (×25): 324 mg via ORAL
  Filled 2017-05-21 (×27): qty 4

## 2017-05-21 MED ORDER — PANTOPRAZOLE SODIUM 40 MG PO PACK
40.0000 mg | PACK | Freq: Every day | ORAL | Status: DC
  Administered 2017-05-21 – 2017-05-26 (×6): 40 mg via ORAL
  Filled 2017-05-21 (×5): qty 20

## 2017-05-21 MED ORDER — METHYLPHENIDATE HCL 5 MG PO TABS
5.0000 mg | ORAL_TABLET | Freq: Two times a day (BID) | ORAL | Status: DC
  Administered 2017-05-21 – 2017-05-27 (×13): 5 mg via ORAL
  Filled 2017-05-21 (×13): qty 1

## 2017-05-21 MED ORDER — CLONIDINE HCL 0.1 MG PO TABS: 0.1000 mg | ORAL_TABLET | ORAL | Status: DC | PRN

## 2017-05-21 NOTE — IPOC Note (Signed)
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)   SOB (shortness of breath) on exertion   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)   Aphasia as late effect of stroke   Small cell lung cancer, left upper lobe (HCC)     Functional Problem List: Nursing Bowel, Bladder, Endurance, Motor, Safety  PT Balance, Endurance, Motor, Pain, Perception, Safety  OT Balance, Cognition, Endurance, Motor, Perception, Sensory  SLP    TR         Basic ADL's: OT Grooming, Bathing, Dressing, Toileting, Eating     Advanced  ADL's: OT       Transfers: PT Bed Mobility, Bed to Chair, Car, Furniture, Floor  OT Tub/Shower, Agricultural engineer: PT Emergency planning/management officer, Ambulation, Stairs     Additional Impairments: OT Fuctional Use of Upper Extremity  SLP        TR      Anticipated Outcomes Item Anticipated Outcome  Self Feeding Supervision   Swallowing      Basic self-care  Supervision   Toileting  Supervision    Bathroom Transfers Supervision  Bowel/Bladder  Continent of bowel and bladder with min assist  Transfers  supervision  Locomotion  supervision with LRAD  Communication     Cognition     Pain  Pain free or less than 3  Safety/Judgment  free from falls with transfers with min assist   Therapy Plan: PT Intensity: Minimum of 1-2 x/day ,45 to 90 minutes PT Frequency: 5 out of 7 days PT Duration Estimated Length of Stay: 24-28 days OT Intensity: Minimum of 1-2 x/day, 45 to 90 minutes OT Frequency: 5 out of 7 days OT Duration/Estimated Length of Stay: 24-28 days      Team Interventions: Nursing Interventions Patient/Family Education, Bowel Management, Dysphagia/Aspiration Precaution Training, Bladder Management  PT interventions Ambulation/gait  training, Balance/vestibular training, Cognitive remediation/compensation, Community reintegration, Discharge planning, Functional electrical stimulation, Neuromuscular re-education, DME/adaptive equipment instruction, Functional mobility training, Pain management, Patient/family education, Psychosocial support, Stair training, Splinting/orthotics, Therapeutic Activities, Therapeutic Exercise, UE/LE Strength taining/ROM, UE/LE Coordination activities, Visual/perceptual remediation/compensation, Wheelchair propulsion/positioning  OT Interventions Training and development officer, Cognitive remediation/compensation, Discharge planning, DME/adaptive equipment instruction, Functional mobility training, Neuromuscular re-education, Patient/family education, Psychosocial support, Self Care/advanced ADL retraining, Therapeutic Activities, Therapeutic Exercise, UE/LE Strength taining/ROM, UE/LE Coordination activities, Visual/perceptual remediation/compensation  SLP Interventions    TR Interventions    SW/CM Interventions Discharge Planning, Psychosocial Support, Patient/Family Education   Barriers to Discharge MD  Medical stability  Nursing      PT Inaccessible home environment, Decreased caregiver support    OT      SLP      SW       Team Discharge Planning: Destination: PT-Home ,OT- Home , SLP-  Projected Follow-up: PT-Home health PT, 24 hour supervision/assistance, OT-  Home health OT, SLP-  Projected Equipment Needs: PT-To be determined, OT- To be determined, SLP-  Equipment Details: PT- , OT-  Patient/family involved in discharge planning: PT- Patient, Family member/caregiver,  OT-Patient, Family member/caregiver, SLP-   MD ELOS: 7-10d Medical Rehab Prognosis:  Good Assessment:  77 y.o. 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 He did not  receive TPA  secondary to concerns of potential brain metastasis. CTA brain done revealing demonstrating left M1 occlusion and patient underwent mechanical thrombectomy and ICA stenting. Follow up MRI/MRA brain showed restored patency of L-ICA and L-MCAwith left basal ganglia infarct, small infarct in left amygdala and small number of scatterd tiny cortical infarcts in L-MCA territory.He had hypotension post stenting requiring pressors and ultimately transitioned tooral midodrine and florinef for BP support.   Dr. Charlann Lange. Rogue Bussing felt that stroke likely cardioembolic and hHe was started on ASA/Plavix--loop recorder recommended to rule out A fib.He was admitted to CIR on 05/11/17 due to functinal deficits fromleft sided weakness, fluctuating bouts of lethargy, aphasia and dysphagia. He developed fever of 101 on 3/10 with lethargy, was pan cultured and started on antibiotics due to concerns of PNA. On 3/11, he was noted to have neurologic decline with worsening of cognition, non verbal state and difficulty with mobility. CT head done revealing acute left basal ganglia with edema and regional mass effect. ASA/Plavix were discontinued and he was transferred to acute hospital for closer monitoring. Follow up CT head has been stable and lethargy resolving. He was downgraded to dysphagia 1, thin liquids. Wife reported head shaking while asleep on 3/13 and EEG shoed focal slowing over left temporal region ---no epileptiform discharges noted.    Now requiring 24/7 Rehab RN,MD, as well as CIR level PT, OT and SLP.  Treatment team will focus on ADLs and mobility with goals set at Sup    See Team Conference Notes for weekly updates to the plan of care

## 2017-05-21 NOTE — Evaluation (Signed)
Speech Language Pathology Assessment and Plan  Patient Details  Name: Edwin Jones MRN: [id number] Date of Birth: [birth year]  SLP Diagnosis: Dysarthria;Speech and Language deficits  Rehab Potential: Fair ELOS: 4 weeks    Today's Date: 05/21/2017 SLP Individual Time: 1300-1400 SLP Individual Time Calculation (min): 60 min   Problem List:  Patient Active Problem List   Diagnosis Date Noted  . SOB (shortness of breath) on exertion   . Hemiparesis affecting right side as late effect of stroke ([place])   . Aphasia as late effect of stroke   . Small cell lung cancer, left upper lobe ([place])   . Nontraumatic acute hemorrhage of basal ganglia ([place]) 05/17/2017  . Anterior cerebral circulation hemorrhagic infarction 05/17/2017  . Pneumonia   . Cerebral edema (HCC)   . Cough   . Fever, unspecified   . Orthostatic hypotension   . Labile blood pressure   . Acute blood loss anemia   . Diabetes mellitus type 2 in nonobese (HCC)   . Labile blood glucose   . CKD (chronic kidney disease) 05/11/2017  . Infarction of left basal ganglia ([place]) 05/11/2017  . Idiopathic hypotension 05/09/2017  . Stroke ([place]) 05/06/2017  . Stroke (cerebrum) ([place]) 05/06/2017  . Acute ischemic left MCA stroke ([place]) 05/06/2017  . Cancer of upper lobe of left lung ([place]) 10/30/2016  . Pure hypercholesterolemia 06/22/2014  . GERD without esophagitis 06/22/2014  . Benign essential hypertension 06/22/2014  . Diabetes mellitus without complication ([place]) 57/84/6962   Past Medical History:  Past Medical History:  Diagnosis Date  . Cancer ([place])   . COPD (chronic obstructive pulmonary disease) ([place])   . Diabetes mellitus without complication ([place])   . GERD without esophagitis   . Hepatitis C virus    history of hep C treated with Harvoni  . Hypercholesterolemia   . Hypertension   . Seasonal allergies    Past Surgical History:  Past Surgical History:  Procedure Laterality Date  . COLONOSCOPY     approximately 11  years ago  . FLEXIBLE BRONCHOSCOPY N/A 11/05/2016   Procedure: FLEXIBLE BRONCHOSCOPY;  Surgeon: Wilhelmina Mcardle, MD;  Location: [facility];  Service: Pulmonary;  Laterality: N/A;  . IR FLUORO GUIDE CV LINE RIGHT  05/06/2017  . IR INTRAVSC STENT CERV CAROTID W/O EMB-PROT MOD SED INC ANGIO  05/06/2017  . IR PERCUTANEOUS ART THROMBECTOMY/INFUSION INTRACRANIAL INC DIAG ANGIO  05/06/2017  . IR US GUIDE VASC ACCESS LEFT  05/06/2017  . IR US GUIDE VASC ACCESS RIGHT  05/06/2017  . IR US GUIDE VASC ACCESS RIGHT  05/06/2017  . PORTA CATH INSERTION N/A 11/23/2016   Procedure: Glori Luis Cath Insertion;  Surgeon: Algernon Huxley, MD;  Location: [place] [facility];  Service: Cardiovascular;  Laterality: N/A;  . RADIOLOGY WITH ANESTHESIA N/A 05/06/2017   Procedure: IR WITH ANESTHESIA;  Surgeon: Radiologist, Medication, MD;  Location: [place];  Service: Radiology;  Laterality: N/A;  . TEE WITHOUT CARDIOVERSION N/A 05/10/2017   Procedure: TRANSESOPHAGEAL ECHOCARDIOGRAM (TEE);  Surgeon: Sanda Klein, MD;  Location[facility];  Service: Cardiovascular;  Laterality: N/A;  . TONSILLECTOMY      Assessment / Plan / Recommendation Clinical Impression Edwin Jones is an 77 y.o. 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 He did not receive TPA secondary to concerns of potential brain metastasis. CTA brain done revealing demonstrating  left M1 occlusion and patient underwent mechanical thrombectomy and ICA stenting. Follow up MRI/MRA brain showed restored patency of L-ICA and L-MCAwith left basal ganglia infarct, small infarct in left amygdala and small number of scatterd tiny cortical infarcts in L-MCA territory.He had hypotension post stenting requiring pressors and ultimately transitioned tooral midodrine and florinef for BP support. Dr. Charlann Lange. Rogue Bussing felt that stroke likely  cardioembolic and hHe was started on ASA/Plavix--loop recorder recommended to rule out A fib.He was admitted to CIR on 05/11/17 due to functinal deficits fromleft sided weakness, fluctuating bouts of lethargy, aphasia and dysphagia. He developed fever of 101 on 3/10 with lethargy, was pan cultured and started on antibiotics due to concerns of PNA. On 3/11, he was noted to have neurologic decline with worsening of cognition, non verbal state and difficulty with mobility. CT head done revealing acute left basal ganglia with edema and regional mass effect. ASA/Plavix were discontinued and he was transferred to acute hospital for closer monitoring. Follow up CT head has been stable and lethargy resolving. He was downgraded to dysphagia 1, thin liquids. Wife reported head shaking while asleep on 3/13 and EEG showed focal slowing over left temporal region ---no epileptiform discharges noted. Pt readmitted to CIR on 05/20/17.   Speech-language evaluation and bedside swallow evaluation were completed on 05/21/17. Pt presents with severe global aphasia, is largely nonverbal and is not able to demonstrate understanding of most spoken or written language.  He is unable to select object in field of 2, requires Max A hand over hand function demonstration and sentence completion to name common objects, inability to answer yes/no related to himself, unable to recite/imitate rote information, little task initiation, unable to follow 1 step directions related to self (touch ear etc) and when he is able to name object speech intelligibility at the word level is decreased d/t significantly decrease vocal intensity and moderate flaccidity on right. Pt demonstrate gropping during unstructured attempts to speech and also demonstrated apraxia when attempting to press call light. Pt demosntrates moderate oral phase dyspahgia c/b munch chew pattern and decreased bolus cohesion resulting in trace to mild oral residue on right with  dysphagia 2 textures. Recommend diet of dysphagia 2 because he managed functionally. No overt s/s of aspiration with thin liquids via straw. Skilled ST services are required to address the above mentioned goals, increase functional independence and reduce caregiver burden. Anticipate that pt will require SNF follow up for further therapy.   Skilled Therapeutic Interventions          Skilled treatment session focused on completion of BSE and SLE, see above. Treatment session also focused on skilled observation of pt consuming trial tray of dysphagia 2 as pt is previous known to this Probation officer and has been consuming trials of advanced diet textures with this Probation officer during acute services. Recommend upgrading diet to dysphagia 2 - education provided to nursing. Pt will need ongoing assessment of cognitive function as language deficits improve to determine cognitive deficits.    SLP Assessment  Patient will need skilled Speech Lanaguage Pathology Services during CIR admission    Recommendations  SLP Diet Recommendations: Dysphagia 2 (Fine chop);Thin Liquid Administration via: Cup;Straw Medication Administration: Crushed with puree Supervision: Staff to assist with self feeding;Full supervision/cueing for compensatory strategies Compensations: Minimize environmental distractions;Slow rate;Small sips/bites Postural Changes and/or Swallow Maneuvers: Seated upright 90 degrees Oral Care Recommendations: Oral care BID Patient destination: [place] (SNF) Follow up Recommendations: Skilled Nursing facility Equipment Recommended: None recommended by SLP  SLP Frequency 3 to 5 out of 7 days   SLP Duration  SLP Intensity  SLP Treatment/Interventions 4 weeks  Minumum of 1-2 x/day, 30 to 90 minutes  Cueing hierarchy;Functional tasks;Patient/family education;Therapeutic Activities;Speech/Language facilitation;Dysphagia/aspiration precaution training;Multimodal communication approach     Pain Pain Assessment Pain Assessment: No/denies pain  Prior Functioning Cognitive/Linguistic Baseline: Within functional limits Type of Home: House  Lives With: Spouse Available Help at Discharge: Family;Available 24 hours/day Vocation: Retired  Function:  Eating Eating   Modified Consistency Diet: Yes Eating Assist Level: Set up assist for;Supervision or verbal cues;More than reasonable amount of time;Helper checks for pocketed food;Help managing cup/glass;Help with picking up utensils;Helper scoops food on utensil   Eating Set Up Assist For: Opening containers Helper [place] on Utensil: Occasionally     Cognition Comprehension Comprehension assist level: Understands basic 25 - 49% of the time/ requires cueing 50 - 75% of the time;Understands basic less than 25% of the time/ requires cueing >75% of the time  Expression   Expression assist level: Expresses basis less than 25% of the time/requires cueing >75% of the time.  Social Interaction Social Interaction assist level: Interacts appropriately less than 25% of the time. May be withdrawn or combative.  Problem Solving Problem solving assist level: Solves basic less than 25% of the time - needs direction nearly all the time or does not effectively solve problems and may need a restraint for safety  Memory Memory assist level: Recognizes or recalls less than 25% of the time/requires cueing greater than 75% of the time   Short Term Goals: Week 1: SLP Short Term Goal 1 (Week 1): Pt will demonstrate use of call light with Max A cues for 50% of opportunities.  SLP Short Term Goal 2 (Week 1): Pt will select requested object in field of 2 with Max A cues in 8 out of 10 opportunities.  SLP Short Term Goal 3 (Week 1): Pt will name common object in 8 of 10 opportunities with Max A cues.  SLP Short Term Goal 4 (Week 1): Pt will follow 1 step simple directions in 8 out of 10 opportunities with Max A cues.  SLP Short Term Goal 5 (Week  1): Pt will answer basic yes/no question related to himself in 5 out of 10 opportunities with Max A cues.  SLP Short Term Goal 6 (Week 1): Pt will consume dysphagia 2 diet with thin liquids and minimal overt s/s of aspiration and Mod A cues for use of compensatory swallow strategies.   Refer to Care Plan for Long Term Goals  Recommendations for other services: None   Discharge Criteria: Patient will be discharged from SLP if patient refuses treatment 3 consecutive times without medical reason, if treatment goals not met, if there is a change in medical status, if patient makes no progress towards goals or if patient is discharged from hospital.  The above assessment, treatment plan, treatment alternatives and goals were discussed and mutually agreed upon: by patient and by family  Kaleesi Guyton 05/21/2017, 4:52 PM

## 2017-05-21 NOTE — Evaluation (Signed)
Physical Therapy Assessment and Plan  Patient Details  Name: Edwin Jones MRN: [id number] Date of Birth: 25-Dec-1940  PT Diagnosis: Abnormality of gait, Coordination disorder, Hemiparesis dominant and Muscle weakness Rehab Potential: Fair ELOS: 24-28 days   Today's Date: 05/21/2017 PT Individual Time: 9937-1696 PT Individual Time Calculation (min): 60 min    Problem List:  Patient Active Problem List   Diagnosis Date Noted  . SOB (shortness of breath) on exertion   . Hemiparesis affecting right side as late effect of stroke ([place])   . Aphasia as late effect of stroke   . Small cell lung cancer, left upper lobe ([place])   . Nontraumatic acute hemorrhage of basal ganglia ([place]) 05/17/2017  . Anterior cerebral circulation hemorrhagic infarction 05/17/2017  . Pneumonia   . Cerebral edema (HCC)   . Cough   . Fever, unspecified   . Orthostatic hypotension   . Labile blood pressure   . Acute blood loss anemia   . Diabetes mellitus type 2 in nonobese (HCC)   . Labile blood glucose   . CKD (chronic kidney disease) 05/11/2017  . Infarction of left basal ganglia ([place]) 05/11/2017  . Idiopathic hypotension 05/09/2017  . Stroke ([place]) 05/06/2017  . Stroke (cerebrum) ([place]) 05/06/2017  . Acute ischemic left MCA stroke ([place]) 05/06/2017  . Cancer of upper lobe of left lung ([place]) 10/30/2016  . Pure hypercholesterolemia 06/22/2014  . GERD without esophagitis 06/22/2014  . Benign essential hypertension 06/22/2014  . Diabetes mellitus without complication ([place]) 78/93/8101    Past Medical History:  Past Medical History:  Diagnosis Date  . Cancer ([place])   . COPD (chronic obstructive pulmonary disease) ([place])   . Diabetes mellitus without complication ([place])   . GERD without esophagitis   . Hepatitis C virus    history of hep C treated with Harvoni  . Hypercholesterolemia   . Hypertension   . Seasonal allergies    Past Surgical History:  Past Surgical History:  Procedure Laterality Date  .  COLONOSCOPY     approximately 11 years ago  . FLEXIBLE BRONCHOSCOPY N/A 11/05/2016   Procedure: FLEXIBLE BRONCHOSCOPY;  Surgeon: Wilhelmina Mcardle, MD;  Location: [facility];  Service: Pulmonary;  Laterality: N/A;  . IR FLUORO GUIDE CV LINE RIGHT  05/06/2017  . IR INTRAVSC STENT CERV CAROTID W/O EMB-PROT MOD SED INC ANGIO  05/06/2017  . IR PERCUTANEOUS ART THROMBECTOMY/INFUSION INTRACRANIAL INC DIAG ANGIO  05/06/2017  . IR US GUIDE VASC ACCESS LEFT  05/06/2017  . IR US GUIDE VASC ACCESS RIGHT  05/06/2017  . IR US GUIDE VASC ACCESS RIGHT  05/06/2017  . PORTA CATH INSERTION N/A 11/23/2016   Procedure: Glori Luis Cath Insertion;  Surgeon: Algernon Huxley, MD;  Location: [place] [facility];  Service: Cardiovascular;  Laterality: N/A;  . RADIOLOGY WITH ANESTHESIA N/A 05/06/2017   Procedure: IR WITH ANESTHESIA;  Surgeon: Radiologist, Medication, MD;  Location: [facility];  Service: Radiology;  Laterality: N/A;  . TEE WITHOUT CARDIOVERSION N/A 05/10/2017   Procedure: TRANSESOPHAGEAL ECHOCARDIOGRAM (TEE);  Surgeon: Sanda Klein, MD;  Location: [facility];  Service: Cardiovascular;  Laterality: N/A;  . TONSILLECTOMY      Assessment & Plan Clinical Impression: Edwin Jones is a 77 y.o. 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 No tPA given due to concern of brain metastasis. CTA showed a L ICA and  L M1 occlusion. He was taken to IR where he had revascularization of the L ICA occlusion w/ angioplasty and L ICA stent, and mechanical thrombectomy of L M2 occlusion. Extubated post procedure. Follow up MRI/MRA brain showed restored patency of L-ICA and L-MCA, mild cytotoxic edema and left basal ganglia infarct, small infarct in left amygdala and small number of scatterd tiny cortical infarcts in L-MCA territory. He had problems with hypotension after ICA stenting was  placed on Neo-Synephrine drip as well as albumin and stress dose steroids. He was put on oral midodrine and florinef for BP support. Discussed with his oncologist and felt stroke was not related to hypercoagulable state but likely cardioembolic source therefore TEE done on 05/10/17 and showed dilated left atrium but negative for thrombus, NO PFO/ASD and no source of cardiac or aortic emboli. He was started on ASA/Plavix for embolic stroke and loop recorder recommended to rule out A fib. Blood sugars have been poorly controlled and low dose Lantus recommended for tighter control. Patient with resultant left sided weakness with shuffling gait with need for rest breaks, cognitive deficits and difficulty with ADL tasks. CIR recommended and pt admitted to CIR on 05/11/17.    Pt doing well with rehab for about a week, weakness gradually improved and able to walk with PT 450 feet with minimal assist on 05/15/08. However, on 05/16/17, he did not feel well, with fever 101, CXR concerning for infection and he was put on levaquin. 05/17/17 pt no fever but very lethargic with AMS, essentially non verbal, but able to tell his name. Pt completely off his baseline for the last week, which promoted CT stat which showed hemorrhagic conversion of left BG infarct with 14m midline shift. Neurology called and felt pt needs to transfer back to inpt service with ICU monitoring for hemorrhagic conversion. BP at 150s, stopped midodrine and florinef and gave labetalol PRN with goal < 140. Repeat CT head stable.  Medications adjusted and patient now restarted on ASA.  EEG completed with focal slowing over the left temporal region.  Patient cleared by Dr.Xu for re-admission to IP Rehab 05/20/17.     Patient transferred to CIR on 05/20/2017 .   Patient currently requires max with mobility secondary to muscle weakness and muscle paralysis, impaired timing and sequencing, abnormal tone, unbalanced muscle activation, motor apraxia and decreased  coordination, decreased attention to right, decreased motor planning and ideational apraxia, decreased initiation, decreased attention, decreased awareness, decreased problem solving, decreased safety awareness, decreased memory and delayed processing and decreased sitting balance, decreased standing balance, decreased postural control, hemiplegia and decreased balance strategies.  Prior to hospitalization, patient was independent  with mobility and lived with Spouse in a House home.  Home access is 2Stairs to enter.  Patient will benefit from skilled PT intervention to maximize safe functional mobility, minimize fall risk and decrease caregiver burden for planned discharge home with 24 hour supervision.  Anticipate patient will [place].  PT - End of Session Activity Tolerance: Tolerates 10 - 20 min activity with multiple rests Endurance Deficit: Yes PT Assessment Rehab Potential (ACUTE/IP ONLY): Fair PT Barriers to Discharge: Inaccessible home environment;Decreased caregiver support PT Patient demonstrates impairments in the following area(s): Balance;Endurance;Motor;Pain;Perception;Safety PT Transfers Functional Problem(s): Bed Mobility;Bed to Chair;Car;Furniture;Floor PT Locomotion Functional Problem(s): Wheelchair Mobility;Ambulation;Stairs PT Plan PT Intensity: Minimum of 1-2 x/day ,45 to 90 minutes PT Frequency: 5 out of 7 days PT Duration Estimated Length of Stay: 24-28 days PT Treatment/Interventions: Ambulation/gait training;Balance/vestibular  training;Cognitive remediation/compensation;Community reintegration;Discharge planning;Functional electrical stimulation;Neuromuscular re-education;DME/adaptive equipment instruction;Functional mobility training;Pain management;Patient/family education;Psychosocial support;Stair training;Splinting/orthotics;Therapeutic Activities;Therapeutic Exercise;UE/LE Strength taining/ROM;UE/LE Coordination  activities;Visual/perceptual remediation/compensation;Wheelchair propulsion/positioning PT Transfers Anticipated Outcome(s): supervision PT Locomotion Anticipated Outcome(s): supervision with LRAD PT Recommendation Follow Up Recommendations: Home health PT;24 hour supervision/assistance Patient destination: Home Equipment Recommended: To be determined  Skilled Therapeutic Intervention No c/o pain but reporting fatigue throughout session.  Session focus on initial PT evaluation, and pt/family education on rehab process, goals of therapy, plan of care, safety plan, and estimated length of stay.  Pt currently requiring up to max assist for mobility and is limited by apraxia more-so than weakness, but does demonstrate some R hemiparesis.  Returned to room at end of session and positioned in w/c with call bell in reach and needs met.   PT Evaluation Precautions/Restrictions Precautions Precautions: Fall Precaution Comments: apraxic, R hemi, R inattention Pain Pain Assessment Pain Assessment: No/denies pain Home Living/Prior Functioning Home Living Available Help at Discharge: Family;Available 24 hours/day Type of Home: House Home Access: Stairs to enter [employer] of Steps: 2 Entrance Stairs-Rails: Right Home Layout: One level  Lives With: Spouse Prior Function Level of Independence: Independent with gait;Independent with transfers  Able to Take Stairs?: Yes Driving: Yes Comments: pt was indendent prior to admission in feb. Since then had progressed to minguard for ambulation and assist for ADLs Vision/Perception  Perception Perception: Impaired Inattention/Neglect: Does not attend to right side of body;Does not attend to right visual field Praxis Praxis: Impaired Praxis Impairment Details: Initiation;Motor planning  Cognition Overall Cognitive Status: Impaired/Different from baseline Arousal/Alertness: Awake/alert Orientation Level: Oriented to person Attention:  Sustained Sustained Attention: Impaired Awareness: Impaired Awareness Impairment: Intellectual impairment Problem Solving: Impaired Safety/Judgment: Impaired Comments: delayed processing Sensation Sensation Light Touch: Not tested Coordination Gross Motor Movements are Fluid and Coordinated: No Fine Motor Movements are Fluid and Coordinated: No Motor  Motor Motor: Hemiplegia;Abnormal postural alignment and control Motor - Skilled Clinical Observations: R hemiplegia   Mobility Bed Mobility Bed Mobility: Supine to Sit Supine to Sit: 3: Mod assist Transfers Transfers: Yes Sit to Stand: 2: Max assist Stand Pivot Transfers: 2: Max assist Locomotion  Ambulation Ambulation: Yes Ambulation/Gait Assistance: 3: Mod assist Ambulation Distance (Feet): 30 Feet Assistive device: (rail in hallway) Gait Gait Pattern: Decreased step length - right;Poor foot clearance - right;Shuffle Stairs / Additional Locomotion Stairs: Yes Stairs Assistance: 2: Max assist Stair Management Technique: Two rails Number of Stairs: 1 Wheelchair Mobility Wheelchair Mobility: No  Trunk/Postural Assessment  Cervical Assessment Cervical Assessment: Within Functional Limits Thoracic Assessment Thoracic Assessment: Within Functional Limits Lumbar Assessment Lumbar Assessment: Within Functional Limits Postural Control Postural Control: Deficits on evaluation  Balance Static Sitting Balance Static Sitting - Level of Assistance: 5: Stand by assistance Dynamic Sitting Balance Dynamic Sitting - Level of Assistance: 3: Mod assist Static Standing Balance Static Standing - Level of Assistance: 4: Min assist;3: Mod assist Dynamic Standing Balance Dynamic Standing - Level of Assistance: 3: Mod assist;2: Max assist Extremity Assessment      RLE Assessment RLE Assessment: Within Functional Limits RLE Strength RLE Overall Strength Comments: decreased compared to LLE but overall functional LLE  Assessment LLE Assessment: Within Functional Limits   See Function Navigator for Current Functional Status.   Refer to Care Plan for Long Term Goals  Recommendations for other services: None   Discharge Criteria: Patient will be discharged from PT if patient refuses treatment 3 consecutive times without medical reason, if treatment goals not met, if there is a  change in medical status, if patient makes no progress towards goals or if patient is discharged from hospital.  The above assessment, treatment plan, treatment alternatives and goals were discussed and mutually agreed upon: by patient  Michel Santee 05/21/2017, 3:25 PM

## 2017-05-21 NOTE — Progress Notes (Signed)
Subjective/Complaints: Asleep but awakens to voice  ROS- cannot obtain due to MS/Aphasia  Objective: Vital Signs: Blood pressure (!) 150/74, pulse 65, temperature 98.9 F (37.2 C), temperature source Oral, resp. rate 16, height '6\' 3"'$  (1.905 m), weight 93.9 kg (207 lb), SpO2 100 %. Dg Chest 2 View  Result Date: 05/20/2017 CLINICAL DATA:  Small-cell lung cancer. EXAM: CHEST - 2 VIEW COMPARISON:  05/18/2017 FINDINGS: AP and lateral views of the chest obtained. Right lung clear. Similar appearance left hilar and parahilar opacity. No pulmonary edema or pleural effusion. Cardiopericardial silhouette is at upper limits of normal for size. Right Port-A-Cath again noted. The visualized bony structures of the thorax are intact. IMPRESSION: No substantial change. Electronically Signed   By: Misty Stanley M.D.   On: 05/20/2017 20:57   Results for orders placed or performed during the hospital encounter of 05/20/17 (from the past 72 hour(s))  Glucose, capillary     Status: Abnormal   Collection Time: 05/20/17  5:03 PM  Result Value Ref Range   Glucose-Capillary 195 (H) 65 - 99 mg/dL  Glucose, capillary     Status: Abnormal   Collection Time: 05/20/17  9:14 PM  Result Value Ref Range   Glucose-Capillary 183 (H) 65 - 99 mg/dL  CBC WITH DIFFERENTIAL     Status: Abnormal   Collection Time: 05/21/17  4:31 AM  Result Value Ref Range   WBC 5.0 4.0 - 10.5 K/uL   RBC 3.59 (L) 4.22 - 5.81 MIL/uL   Hemoglobin 11.0 (L) 13.0 - 17.0 g/dL   HCT 34.2 (L) 39.0 - 52.0 %   MCV 95.3 78.0 - 100.0 fL   MCH 30.6 26.0 - 34.0 pg   MCHC 32.2 30.0 - 36.0 g/dL   RDW 13.0 11.5 - 15.5 %   Platelets 310 150 - 400 K/uL   Neutrophils Relative % 66 %   Neutro Abs 3.3 1.7 - 7.7 K/uL   Lymphocytes Relative 19 %   Lymphs Abs 1.0 0.7 - 4.0 K/uL   Monocytes Relative 11 %   Monocytes Absolute 0.6 0.1 - 1.0 K/uL   Eosinophils Relative 3 %   Eosinophils Absolute 0.2 0.0 - 0.7 K/uL   Basophils Relative 1 %   Basophils  Absolute 0.0 0.0 - 0.1 K/uL    Comment: Performed at [facility], [address], [place], [place] [address]  Comprehensive metabolic panel     Status: Abnormal   Collection Time: 05/21/17  4:31 AM  Result Value Ref Range   Sodium 134 (L) 135 - 145 mmol/L   Potassium 4.5 3.5 - 5.1 mmol/L   Chloride 102 101 - 111 mmol/L   CO2 21 (L) 22 - 32 mmol/L   Glucose, Bld 198 (H) 65 - 99 mg/dL   BUN 20 6 - 20 mg/dL   Creatinine, Ser 1.75 (H) 0.61 - 1.24 mg/dL   Calcium 8.9 8.9 - 10.3 mg/dL   Total Protein 6.7 6.5 - 8.1 g/dL   Albumin 2.8 (L) 3.5 - 5.0 g/dL   AST 13 (L) 15 - 41 U/L   ALT 12 (L) 17 - 63 U/L   Alkaline Phosphatase 69 38 - 126 U/L   Total Bilirubin 0.4 0.3 - 1.2 mg/dL   GFR calc non Af Amer 36 (L) >60 mL/min   GFR calc Af Amer 42 (L) >60 mL/min    Comment: (NOTE) The eGFR has been calculated using the CKD EPI equation. This calculation has not been validated in all clinical  situations. eGFR's persistently <60 mL/min signify possible Chronic Kidney Disease.    Anion gap 11 5 - 15    Comment: Performed at [facility] [address] [place], [place] [address]  Glucose, capillary     Status: Abnormal   Collection Time: 05/21/17  6:26 AM  Result Value Ref Range   Glucose-Capillary 203 (H) 65 - 99 mg/dL     HEENT: poor dentition Cardio: RRR and no murmur Resp: CTA B/L and unlabored GI: BS positive and non tender Extremity:  No Edema Skin:   Intact and Other portacath site intact Neuro: Lethargic, Abnormal Motor 3/5 RUE, 4/5 RLE , 5/5 on Left side, Dysarthric, Aphasic, Inattention and Apraxic Musc/Skel:  Other no pain with UE or LE ROM Gen NAD   Assessment/Plan: 1. 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 Physiatrist is providing close team supervision and 24 hour management of active medical problems listed  below. Physiatrist and rehab team continue to assess barriers to discharge/monitor patient progress toward functional and medical goals. FIM:                                  Medical Problem List and Plan: 1.   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 DVT Prophylaxis/Anticoagulation: Pharmaceutical:Heparin--continue 3. Pain Management:tylenol prn 4. Mood:LCSW to follow for evaluation when appropriate. 5. Neuropsych: This patientis notcapable of making decisions on hisown behalf. 6. Skin/Wound Care:routine pressure relief measures. 7. Fluids/Electrolytes/Nutrition:Monitor I/O. Continue to offer supplements between meals. 8.Hypertension:Hypotensionhs resolved and patient offMidodrine and Florinef 9. Acute on chronic WER:[id number] SCr- 1.8-1.9. Improving with hydration.  10. ABLA: Monitor H/H for signs of bleedinghas been in 10- 11 range. Recheck in am. 11. LLL/LUL YPP:[id number] IVF. Continue Levaquin D# 6/7. Will check F/U CXR due to reports of SOB 12. Lethargy: May need ritalin to help with activation. Will need to monitor BP--SBP goal <140 Trial Ritalin 13. Oral thrush: Will start nystatin mouthwash as well as oral care every 4 hours while awake. 14. COPD/Left lung cancer: On Breo with albuterol prn.     LOS (Days) 1 A FACE TO FACE EVALUATION WAS PERFORMED  Charlett Blake 05/21/2017, 7:12 AM

## 2017-05-21 NOTE — Progress Notes (Addendum)
Occupational Therapy Assessment and Plan  Patient Details  Name: Edwin Jones MRN: [id number] Date of Birth: 02-Sep-1940  OT Diagnosis: apraxia, ataxia, hemiplegia affecting dominant side and muscle weakness (generalized) Rehab Potential: Rehab Potential (ACUTE ONLY): Good ELOS: 24-28 days   Today's Date: 05/21/2017 OT Individual Time: 2130-8657 OT Individual Time Calculation (min): 70 min     Problem List:  Patient Active Problem List   Diagnosis Date Noted  . SOB (shortness of breath) on exertion   . Hemiparesis affecting right side as late effect of stroke ([place])   . Aphasia as late effect of stroke   . Small cell lung cancer, left upper lobe ([place])   . Nontraumatic acute hemorrhage of basal ganglia ([place]) 05/17/2017  . Anterior cerebral circulation hemorrhagic infarction 05/17/2017  . Pneumonia   . Cerebral edema ([facility])   . Cough   . Fever, unspecified   . Orthostatic hypotension   . Labile blood pressure   . Acute blood loss anemia   . Diabetes mellitus type 2 in nonobese ([facility])   . Labile blood glucose   . CKD (chronic kidney disease) 05/11/2017  . Infarction of left basal ganglia ([place]) 05/11/2017  . Idiopathic hypotension 05/09/2017  . Stroke ([place]) 05/06/2017  . Stroke (cerebrum) ([place]) 05/06/2017  . Acute ischemic left MCA stroke ([place]) 05/06/2017  . Cancer of upper lobe of left lung ([place]) 10/30/2016  . Pure hypercholesterolemia 06/22/2014  . GERD without esophagitis 06/22/2014  . Benign essential hypertension 06/22/2014  . Diabetes mellitus without complication ([place]) 84/[id number]/6295    Past Medical History:  Past Medical History:  Diagnosis Date  . Cancer ([place])   . COPD (chronic obstructive pulmonary disease) ([place])   . Diabetes mellitus without complication ([place])   . GERD without esophagitis   . Hepatitis C virus    history of hep C treated with Harvoni  . Hypercholesterolemia   . Hypertension   . Seasonal allergies    Past Surgical History:  Past Surgical  History:  Procedure Laterality Date  . COLONOSCOPY     approximately 11 years ago  . FLEXIBLE BRONCHOSCOPY N/A 11/05/2016   Procedure: FLEXIBLE BRONCHOSCOPY;  Surgeon: Wilhelmina Mcardle, MD;  Location: [facility];  Service: Pulmonary;  Laterality: N/A;  . IR FLUORO GUIDE CV LINE RIGHT  05/06/2017  . IR INTRAVSC STENT CERV CAROTID W/O EMB-PROT MOD SED INC ANGIO  05/06/2017  . IR PERCUTANEOUS ART THROMBECTOMY/INFUSION INTRACRANIAL INC DIAG ANGIO  05/06/2017  . IR US GUIDE VASC ACCESS LEFT  05/06/2017  . IR US GUIDE VASC ACCESS RIGHT  05/06/2017  . IR US GUIDE VASC ACCESS RIGHT  05/06/2017  . PORTA CATH INSERTION N/A 11/23/2016   Procedure: Glori Luis Cath Insertion;  Surgeon: Algernon Huxley, MD;  Location: [facility];  Service: Cardiovascular;  Laterality: N/A;  . RADIOLOGY WITH ANESTHESIA N/A 05/06/2017   Procedure: IR WITH ANESTHESIA;  Surgeon: Radiologist, Medication, MD;  Location: [facility];  Service: Radiology;  Laterality: N/A;  . TEE WITHOUT CARDIOVERSION N/A 05/10/2017   Procedure: TRANSESOPHAGEAL ECHOCARDIOGRAM (TEE);  Surgeon: Sanda Klein, MD;  Location: [facility];  Service: Cardiovascular;  Laterality: N/A;  . TONSILLECTOMY      Assessment & Plan Clinical Impression: Patient is a 77 y.o. 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 He did not receive TPA secondary to concerns  of potential brain metastasis. CTA brain done revealing demonstrating left M1 occlusion and patient underwent mechanical thrombectomy and ICA stenting. Follow up MRI/MRA brain showed restored patency of L-ICA and L-MCA with left basal ganglia infarct, small infarct in left amygdala and small number of scatterd tiny cortical infarcts in L-MCA territory. He had hypotension post stenting requiring pressors and ultimately transitioned to oral midodrine and florinef for BP  support.    Dr. Erlinda Hong Dr. Rogue Bussing felt that stroke likely cardioembolic and hHe was started on ASA/Plavix--loop recorder recommended to rule out A fib. He was admitted to CIR on 05/11/17 due to functinal deficits from left sided weakness, fluctuating bouts of lethargy, aphasia and dysphagia.  He developed fever of 101 on 3/10 with lethargy, was pan cultured and started on antibiotics due to concerns of PNA.  On 3/11, he was noted to have neurologic decline with worsening of cognition, non verbal state and difficulty with mobility.  CT head done revealing acute left basal ganglia with edema and regional mass effect. ASA/Plavix were discontinued and he was transferred to acute hospital for closer monitoring. Follow up CT head has been stable and lethargy resolving. He was downgraded to dysphagia 1, thin liquids. Wife reported head shaking while asleep on 3/13 and EEG shoed focal slowing over left temporal region ---no epileptiform discharges noted.    Patient transferred to CIR on 05/20/2017 .    Patient currently requires total/max with basic self-care skills secondary to muscle weakness, impaired timing and sequencing, abnormal tone, unbalanced muscle activation, motor apraxia, ataxia, decreased coordination and decreased motor planning, decreased midline orientation, decreased attention to right, right side neglect and decreased motor planning, decreased initiation, decreased attention, decreased awareness, decreased problem solving, decreased safety awareness, decreased memory and delayed processing and decreased sitting balance, decreased standing balance, decreased postural control, hemiplegia and decreased balance strategies.  Prior to hospitalization, patient could complete BADL with modified independent .  Patient will benefit from skilled intervention to increase independence with basic self-care skills prior to discharge home with care partner.  Anticipate patient will require 24 hour supervision and  follow up home health.  OT - End of Session Endurance Deficit: Yes Endurance Deficit Description: Multiple rest breaks within BADL session OT Assessment Rehab Potential (ACUTE ONLY): Good OT Patient demonstrates impairments in the following area(s): Balance;Cognition;Endurance;Motor;Perception;Sensory OT Basic ADL's Functional Problem(s): Grooming;Bathing;Dressing;Toileting;Eating OT Transfers Functional Problem(s): Tub/Shower;Toilet OT Additional Impairment(s): Fuctional Use of Upper Extremity OT Plan OT Intensity: Minimum of 1-2 x/day, 45 to 90 minutes OT Frequency: 5 out of 7 days OT Duration/Estimated Length of Stay: 24-28 days OT Treatment/Interventions: Balance/vestibular training;Cognitive remediation/compensation;Discharge planning;DME/adaptive equipment instruction;Functional mobility training;Neuromuscular re-education;Patient/family education;Psychosocial support;Self Care/advanced ADL retraining;Therapeutic Activities;Therapeutic Exercise;UE/LE Strength taining/ROM;UE/LE Coordination activities;Visual/perceptual remediation/compensation OT Self Feeding Anticipated Outcome(s): Supervision  OT Basic Self-Care Anticipated Outcome(s): Supervision  OT Toileting Anticipated Outcome(s): Supervision  OT Bathroom Transfers Anticipated Outcome(s): Supervision OT Recommendation Patient destination: Home Follow Up Recommendations: Home health OT Equipment Recommended: To be determined Skilled Therapeutic Intervention Initial eval completed with treatment provided to address functional transfers, functional use of R UE, improved sit<>stand, standing tolerance, and adapted bathing/dressing skills. Stedy used to transfer pt into shower with Max A lift and lower. Pt needed increased time and multimodal cues to initiate power up.  Max cues to integrate R UE into bathing tasks with pt eventually needing hand over hand A to use R UE within bathing tasks. Pt needed max cues to initiate washing each  body part, and to terminate task. Pt non-verbal  throughout session, but shook head to yes/no questions fairly consistently. LB/UB dressing completed wc level at the sink with assistance to thread B LEs into clothing and assist to pull up pants. Max A sit <>stand from lower wc surface.  UB dressing with max A overall 2/2 R hemiplegia, and apraxia.  Incorporate weight bearing through R UE when standing at the sink. Stand-pivot back to bed at end of session Max A to power up and Max A to facilitate pivot.   OT Evaluation Precautions/Restrictions  Precautions Precautions: Fall Precaution Comments: apraxic, R hemi, R inattention Restrictions Weight Bearing Restrictions: No Pain Pain Assessment Pain Assessment: No/denies pain Home Living/Prior Functioning Home Living Family/patient expects to be discharged to:: Private residence Living Arrangements: Spouse/significant other Available Help at Discharge: Family, Available 24 hours/day Type of Home: House Home Access: Stairs to enter [employer] of Steps: 2 Entrance Stairs-Rails: Right Home Layout: One level Bathroom Shower/Tub: Chiropodist: Standard  Lives With: Spouse Prior Function Level of Independence: Independent with gait, Independent with transfers  Able to Take Stairs?: Yes Driving: Yes Vocation: Retired Comments: pt was indendent prior to admission in feb. Since then had progressed to minguard for ambulation and assist for ADLs ADL ADL ADL Comments: Please see functional navigator Vision Baseline Vision/History: Wears glasses Wears Glasses: Distance only;Reading only Patient Visual Report: No change from baseline Vision Assessment?: Vision impaired- to be further tested in functional context Eye Alignment: Within Functional Limits Ocular Range of Motion: Within Functional Limits Alignment/Gaze Preference: Within Defined Limits Depth Perception: Undershoots Perception  Perception:  Impaired Inattention/Neglect: Does not attend to right side of body;Does not attend to right visual field Praxis Praxis: Impaired Praxis Impairment Details: Initiation;Motor planning Cognition Overall Cognitive Status: Impaired/Different from baseline Arousal/Alertness: Awake/alert Attention: Sustained Sustained Attention: Impaired Awareness: Impaired Awareness Impairment: Intellectual impairment Problem Solving: Impaired Safety/Judgment: Impaired Comments: Nonverbal throughout session  Sensation Sensation Light Touch: Not tested Stereognosis: Not tested Additional Comments: Pt unable to respond t oquestions Coordination Gross Motor Movements are Fluid and Coordinated: No Fine Motor Movements are Fluid and Coordinated: No Motor  Motor Motor: Hemiplegia;Abnormal postural alignment and control Motor - Skilled Clinical Observations: R hemiplegia  Mobility  Bed Mobility Bed Mobility: Supine to Sit Supine to Sit: 3: Mod assist Sit to Supine: 3: Mod assist Transfers Sit to Stand: 2: Max assist  Trunk/Postural Assessment  Cervical Assessment Cervical Assessment: Within Functional Limits Thoracic Assessment Thoracic Assessment: Within Functional Limits Lumbar Assessment Lumbar Assessment: Within Functional Limits Postural Control Postural Control: Deficits on evaluation  Balance Static Sitting Balance Static Sitting - Level of Assistance: 4: Min assist Dynamic Sitting Balance Dynamic Sitting - Level of Assistance: 3: Mod assist Static Standing Balance Static Standing - Level of Assistance: 3: Mod assist Dynamic Standing Balance Dynamic Standing - Level of Assistance: 2: Max assist Extremity/Trunk Assessment RUE Assessment RUE Assessment: Exceptions to WFL(strength is WFL, PROM of shoulder 120, -30 of tricep extension PROM) RUE Strength RUE Overall Strength: Deficits RUE Overall Strength Comments: 2-/5 throughout R UE Right Shoulder Flexion: 2-/5 LUE Assessment LUE  Assessment: Within Functional Limits   See Function Navigator for Current Functional Status.   Refer to Care Plan for Long Term Goals  Recommendations for other services: None    Discharge Criteria: Patient will be discharged from OT if patient refuses treatment 3 consecutive times without medical reason, if treatment goals not met, if there is a change in medical status, if patient makes no progress towards goals or if patient  is discharged from hospital.  The above assessment, treatment plan, treatment alternatives and goals were discussed and mutually agreed upon: by patient  Valma Cava 05/21/2017, 4:03 PM

## 2017-05-22 ENCOUNTER — Inpatient Hospital Stay (HOSPITAL_COMMUNITY): Payer: Medicare Other | Admitting: Physical Therapy

## 2017-05-22 ENCOUNTER — Inpatient Hospital Stay (HOSPITAL_COMMUNITY): Payer: Medicare Other | Admitting: Occupational Therapy

## 2017-05-22 DIAGNOSIS — E1165 Type 2 diabetes mellitus with hyperglycemia: Secondary | ICD-10-CM

## 2017-05-22 LAB — GLUCOSE, CAPILLARY
Glucose-Capillary: 212 mg/dL — ABNORMAL HIGH (ref 65–99)
Glucose-Capillary: 192 mg/dL — ABNORMAL HIGH (ref 65–99)
Glucose-Capillary: 168 mg/dL — ABNORMAL HIGH (ref 65–99)
Glucose-Capillary: 142 mg/dL — ABNORMAL HIGH (ref 65–99)

## 2017-05-22 MED ORDER — GLIMEPIRIDE 2 MG PO TABS
1.0000 mg | ORAL_TABLET | Freq: Every day | ORAL | Status: DC
  Administered 2017-05-22 – 2017-05-25 (×4): 1 mg via ORAL
  Filled 2017-05-22 (×4): qty 1

## 2017-05-22 NOTE — Progress Notes (Signed)
Occupational Therapy Session Note  Patient Details  Name: Edwin Jones MRN: [id number] Date of Birth: [birth year]  Today's Date: 05/22/2017 OT Individual Time: 7672-0947 OT Individual Time Calculation (min): 45 min    Short Term Goals: Week 1:  OT Short Term Goal 1 (Week 1): Pt will be able to use R hand to bring wash cloth to face with min A. OT Short Term Goal 2 (Week 1): Pt will demonstrate improved processing by donning a shirt in 1-2 minutes (vs 5 minutes). OT Short Term Goal 3 (Week 1): Pt will demonstrate improved dynamic sitting balance by donning pants over R leg from a seated position with Min A OT Short Term Goal 4 (Week 1): Pt will demonstrate improved activity tolerance to stand at the sink with min A to complete oral care.  Skilled Therapeutic Interventions/Progress Updates: patient participation today was as folllows:  Participated max Right hand over hand assist to wash face and left arm  Processed verbal and tactile cues to don his stretchy sleeveless undershirt in over 5 minutes with max A and required max A and approximately 4 to don sweat shirt and cues to pull the shirt down all the way around his body.  (sitting upright in bed and he was able to pull himself forward on the bed with tactile cues and min A in order for caregiver to continue helping him pull down his shirt onto this sides  Patient was able to focus and stay awake and aroused to complete lateral rolls with min a for pericleansing and brief change.  Therapy Documentation Precautions:  Precautions Precautions: Fall Precaution Comments: apraxic, R hemi, R inattention Restrictions Weight Bearing Restrictions: No  Pain:denied   Therapy/Group: Individual Therapy  Alfredia Ferguson [facility] 05/22/2017, 5:49 PM

## 2017-05-22 NOTE — Progress Notes (Signed)
Occupational Therapy Session Note  Patient Details  Name: Edwin Jones MRN: [id number] Date of Birth: [birth year]  Today's Date: 05/22/2017 OT Individual Time: 1245-1330 OT Individual Time Calculation (min): 45 min    Therapy Documentation  Patient's wife present for this session and c/o that his underarms smelled foul although this clinician could not smell them.  The wife asked if cleansing his underarms could be incorporated into this short session, and this clinican replied, "yes.   We can pull in right hand use, balance sitting edge of bed, and many thinking skills, among other activities."   So patient participated as follows:  He was able to arouse and move supine to edge of bed with Mod A;      He was able to maintain fair- slightly dynamic sitting balance edge of bed to wash face, and arm and doff and redon undershirt and long sleeve sweat shirt.  He was able to stay awake (wife did constantly reinforce the task at hand and stand over him while he worked with this clinician edge of bed,, and at times he stared at his wife).    He completed hand over hand assist with mod a to wash underarms, neck, face (wife initiated washing his back).  He was able with extra time to process through the ataxia and apraxia with min to max hand over hand (or similar physical/tactile assist) incorpate hemi dressing techniques for donning his under shirt and his sweat shirt.  He was able to transfer edge of bed to supine with Min to moderate assist.  He required max assist for positioning.  When allowed extra to process the verbal and visual 1 step commands, [name] was able able to initiate tasks.  He did not speak words during this session when asked, but he nodded his head.   Wife told him that at some point he will be able to speak more.  He was left right side lying at the end of the session.  And, evidently he'd voided through his brief and onto the sheet during his bed mobility as this clinician  noted his bed sheets and part of his pants were wet.   He was assisted out of the wet positiion but still side lying at the end of the session as he and his wife waited for staff to come change his brief and his wet bed sheets.  One of his daughters napped on the sofa bed in his room during this session.  Precautions:  Precautions Precautions: Fall Precaution Comments: apraxic, R hemi, R inattention Restrictions Weight Bearing Restrictions: No  Pain:denied  Therapy/Group: Individual Therapy  Alfredia Ferguson [facility] 05/22/2017, 5:58 PM

## 2017-05-22 NOTE — Progress Notes (Signed)
Physical Therapy Session Note  Patient Details  Name: Edwin Jones MRN: [id number] Date of Birth: 09/13/1940  Today's Date: 05/22/2017 PT Individual Time: 1035-1130 AND 1610-1650 PT Individual Time Calculation (min): 55 min AND 40 min  Short Term Goals: Week 1:  PT Short Term Goal 1 (Week 1): Pt will transition to EOB with min assist PT Short Term Goal 2 (Week 1): Pt will transfer in and out of wheelchair with mod assist PT Short Term Goal 3 (Week 1): Pt will ambulate 35' with LRAD and mod assist PT Short Term Goal 4 (Week 1): Pt will negotiate 4 steps with 2 rails and max assist  Skilled Therapeutic Interventions/Progress Updates:   Session 1:  Pt in supine and agreeable to therapy, denies pain. Transferred to EOB w/ mod assist and to w/c via stand pivot w/ max assist. Verbal and visual cues for UE placement to utilize armrests for support. Total assist w/c transport to gym. Worked on standing balance in gym w/ emphasis on lateral weight-shifting and R quad activation in stance. Mod-max assist for standing balance w/ unilateral UE support. Performed LUE reaching tasks to facilitate lateral weight-shifting. Mild L pushing noted in stance and unable to achieve full R knee extension despite max assist. Performed multiple bouts of standing, 30-60 sec at a time. Sit<>stands progressing mod to total assist w/ fatigue. Performed reaching tasks w/ cups in seated emphasizing attending to R environment w/ LUE and vision. Instructed pt on L hemi technique for w/c propulsion for increased independence w/ functional mobility. Verbal, visual, and manual cues for technique requiring max assist, fading to supervision after 75-100'. Returned to room via w/c and transferred back to EOB and to supine w/ mod assist. Ended session in supine and in care of family, all needs met.   Session 2: Pt in supine and agreeable to therapy, no c/o pain. Transferred to EOB w/ mod assist, total assist to don pants and then  bring over hips once in standing. Mod assist to stand and ambulated 25' from EOB out into hallway w/o device. Support provided on hemi side, mod-max manual assistance to facilitate lateral weight shifting in order to overcome L pushing and clear RLE from floor. Verbal cues for gait pattern. Otherwise, min-mod assist to maintain upright balance during gait. W/c follow for safety. Performed NuStep 6 min @ L1 to facilitate reciprocal movement pattern, verbal cues for technique. Returned to room in w/c via total assist. Ended session in w/c and in care of wife and daughter, Candee Furbish donned and all needs met.   Therapy Documentation Precautions:  Precautions Precautions: Fall Precaution Comments: apraxic, R hemi, R inattention Restrictions Weight Bearing Restrictions: No  See Function Navigator for Current Functional Status.   Therapy/Group: Individual Therapy  Caylan Schifano K Arnette 05/22/2017, 12:22 PM

## 2017-05-22 NOTE — Progress Notes (Signed)
Subjective/Complaints: No problems overnight.   ROS: limited due to language/communication    Objective: Vital Signs: Blood pressure (!) 141/66, pulse 72, temperature 98.9 F (37.2 C), temperature source Oral, resp. rate 16, height '6\' 3"'$  (1.905 m), weight 93.9 kg (207 lb), SpO2 98 %. Dg Chest 2 View  Result Date: 05/20/2017 CLINICAL DATA:  Small-cell lung cancer. EXAM: CHEST - 2 VIEW COMPARISON:  05/18/2017 FINDINGS: AP and lateral views of the chest obtained. Right lung clear. Similar appearance left hilar and parahilar opacity. No pulmonary edema or pleural effusion. Cardiopericardial silhouette is at upper limits of normal for size. Right Port-A-Cath again noted. The visualized bony structures of the thorax are intact. IMPRESSION: No substantial change. Electronically Signed   By: Misty Stanley M.D.   On: 05/20/2017 20:57   Results for orders placed or performed during the hospital encounter of 05/20/17 (from the past 72 hour(s))  Glucose, capillary     Status: Abnormal   Collection Time: 05/20/17  5:03 PM  Result Value Ref Range   Glucose-Capillary 195 (H) 65 - 99 mg/dL  Glucose, capillary     Status: Abnormal   Collection Time: 05/20/17  9:14 PM  Result Value Ref Range   Glucose-Capillary 183 (H) 65 - 99 mg/dL  CBC WITH DIFFERENTIAL     Status: Abnormal   Collection Time: 05/21/17  4:31 AM  Result Value Ref Range   WBC 5.0 4.0 - 10.5 K/uL   RBC 3.59 (L) 4.22 - 5.81 MIL/uL   Hemoglobin 11.0 (L) 13.0 - 17.0 g/dL   HCT 34.2 (L) 39.0 - 52.0 %   MCV 95.3 78.0 - 100.0 fL   MCH 30.6 26.0 - 34.0 pg   MCHC 32.2 30.0 - 36.0 g/dL   RDW 13.0 11.5 - 15.5 %   Platelets 310 150 - 400 K/uL   Neutrophils Relative % 66 %   Neutro Abs 3.3 1.7 - 7.7 K/uL   Lymphocytes Relative 19 %   Lymphs Abs 1.0 0.7 - 4.0 K/uL   Monocytes Relative 11 %   Monocytes Absolute 0.6 0.1 - 1.0 K/uL   Eosinophils Relative 3 %   Eosinophils Absolute 0.2 0.0 - 0.7 K/uL   Basophils Relative 1 %   Basophils  Absolute 0.0 0.0 - 0.1 K/uL    Comment: Performed at [facility], [address] [place], [place] [address]  Comprehensive metabolic panel     Status: Abnormal   Collection Time: 05/21/17  4:31 AM  Result Value Ref Range   Sodium 134 (L) 135 - 145 mmol/L   Potassium 4.5 3.5 - 5.1 mmol/L   Chloride 102 101 - 111 mmol/L   CO2 21 (L) 22 - 32 mmol/L   Glucose, Bld 198 (H) 65 - 99 mg/dL   BUN 20 6 - 20 mg/dL   Creatinine, Ser 1.75 (H) 0.61 - 1.24 mg/dL   Calcium 8.9 8.9 - 10.3 mg/dL   Total Protein 6.7 6.5 - 8.1 g/dL   Albumin 2.8 (L) 3.5 - 5.0 g/dL   AST 13 (L) 15 - 41 U/L   ALT 12 (L) 17 - 63 U/L   Alkaline Phosphatase 69 38 - 126 U/L   Total Bilirubin 0.4 0.3 - 1.2 mg/dL   GFR calc non Af Amer 36 (L) >60 mL/min   GFR calc Af Amer 42 (L) >60 mL/min    Comment: (NOTE) The eGFR has been calculated using the CKD EPI equation. This calculation has not been validated in all clinical  situations. eGFR's persistently <60 mL/min signify possible Chronic Kidney Disease.    Anion gap 11 5 - 15    Comment: Performed at [facility] [address] [place], [place] [address]  Glucose, capillary     Status: Abnormal   Collection Time: 05/21/17  6:26 AM  Result Value Ref Range   Glucose-Capillary 203 (H) 65 - 99 mg/dL  Glucose, capillary     Status: Abnormal   Collection Time: 05/21/17 11:49 AM  Result Value Ref Range   Glucose-Capillary 203 (H) 65 - 99 mg/dL  Glucose, capillary     Status: Abnormal   Collection Time: 05/21/17  4:43 PM  Result Value Ref Range   Glucose-Capillary 155 (H) 65 - 99 mg/dL  Glucose, capillary     Status: Abnormal   Collection Time: 05/21/17  9:33 PM  Result Value Ref Range   Glucose-Capillary 189 (H) 65 - 99 mg/dL  Glucose, capillary     Status: Abnormal   Collection Time: 05/22/17  6:17 AM  Result Value Ref Range   Glucose-Capillary 212 (H) 65 - 99 mg/dL     HEENT: poor dentition Cardio:RRR without murmur. No JVD  Resp: CTA Bilaterally  without wheezes or rales. Normal effort  GI: BS positive and non tender Extremity:  No Edema Skin:   Intact and Other portacath site intact Neuro: lethargic and aphasic.  Abnormal Motor 3/5 RUE, 4/5 RLE , 5/5 on Left side,  Right inattention.  Musc/Skel:  Other no pain with UE or LE ROM Gen NAD   Assessment/Plan: 1. 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 Physiatrist is providing close team supervision and 24 hour management of active medical problems listed below. Physiatrist and rehab team continue to assess barriers to discharge/monitor patient progress toward functional and medical goals. FIM: Function - Bathing Position: Shower Body parts bathed by patient: Right arm, Chest, Abdomen, Front perineal area, Left upper leg Body parts bathed by helper: Right upper leg, Left upper leg, Right lower leg, Buttocks, Left arm Assist Level: Touching or steadying assistance(Pt > 75%)  Function- Upper Body Dressing/Undressing What is the patient wearing?: Pull over shirt/dress Pull over shirt/dress - Perfomed by helper: Thread/unthread right sleeve, Thread/unthread left sleeve, Put head through opening, Pull shirt over trunk Assist Level: Touching or steadying assistance(Pt > 75%) Function - Lower Body Dressing/Undressing What is the patient wearing?: Non-skid slipper socks, Pants Position: Wheelchair/chair at sink Pants- Performed by helper: Pull pants up/down, Thread/unthread right pants leg, Thread/unthread left pants leg Non-skid slipper socks- Performed by helper: Don/doff left sock, Don/doff right sock Assist for footwear: Dependant Assist for lower body dressing: Touching or steadying assistance (Pt > 75%)(Total A)  Function - Toileting Toileting steps completed by helper: Adjust clothing prior to toileting, Performs perineal hygiene, Adjust clothing  after toileting Assist level: Two helpers  Function - Air cabin crew transfer activity did not occur: N/A  Function - Chair/bed transfer Chair/bed transfer method: Stand pivot Chair/bed transfer assist level: Maximal assist (Pt 25 - 49%/lift and lower) Chair/bed transfer details: Verbal cues for technique, Manual facilitation for weight shifting  Function - Locomotion: Ambulation Assistive device: Rail in hallway Max distance: 30' Assist level: Moderate assist (Pt 50 - 74%) Assist level: Moderate assist (Pt 50 - 74%) Walk 50 feet with 2 turns activity did not occur: Safety/medical concerns Walk 150 feet activity did not occur: Safety/medical concerns Walk 10  feet on uneven surfaces activity did not occur: Safety/medical concerns  Function - Comprehension Comprehension: Auditory Comprehension assist level: Understands basic 25 - 49% of the time/ requires cueing 50 - 75% of the time, Understands basic less than 25% of the time/ requires cueing >75% of the time  Function - Expression Expression: Nonverbal Expression assist level: Expresses basis less than 25% of the time/requires cueing >75% of the time.  Function - Social Interaction Social Interaction assist level: Interacts appropriately less than 25% of the time. May be withdrawn or combative.  Function - Problem Solving Problem solving assist level: Solves basic less than 25% of the time - needs direction nearly all the time or does not effectively solve problems and may need a restraint for safety  Function - Memory Memory assist level: Recognizes or recalls less than 25% of the time/requires cueing greater than 75% of the time Patient normally able to recall (first 3 days only): That he or she is in a hospital, Staff names and faces(d/t SLP has worked with pt numerous times)  Medical Problem List and Plan: 1.   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 DVT  Prophylaxis/Anticoagulation: Pharmaceutical:Heparin--continue 3. Pain Management:tylenol prn 4. Mood:LCSW to follow for evaluation when appropriate. 5. Neuropsych: This patientis notcapable of making decisions on hisown behalf. 6. Skin/Wound Care:routine pressure relief measures. 7. Fluids/Electrolytes/Nutrition:Monitor I/O. Continue to offer supplements between meals. 8.Hypertension:Hypotensionhs resolved and patient offMidodrine and Florinef 9. 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 Improving with hydration.  10. ABLA: Monitor H/H for signs of bleedinghas been in 10- 11 range. 11 on 3/15 11. LLL/LUL VPX:[id number] IVF. Continue Levaquin D# 7/7.   F/U CXR on 3/14 without much change.  12. Lethargy:  Trial Ritalin--observe 13. Oral thrush:  nystatin mouthwash as well as oral care every 4 hours while awake. 14. COPD/Left lung cancer: On Breo with albuterol prn.  15.DM: poor control at present   -SSI   -add low dose amaryl    LOS (Days) 2 A FACE TO FACE EVALUATION WAS PERFORMED  Meredith Staggers 05/22/2017, 9:33 AM

## 2017-05-23 ENCOUNTER — Inpatient Hospital Stay (HOSPITAL_COMMUNITY): Payer: Medicare Other

## 2017-05-23 LAB — GLUCOSE, CAPILLARY
GLUCOSE-CAPILLARY: 214 mg/dL — AB (ref 65–99)
Glucose-Capillary: 221 mg/dL — ABNORMAL HIGH (ref 65–99)
Glucose-Capillary: 135 mg/dL — ABNORMAL HIGH (ref 65–99)
Glucose-Capillary: 176 mg/dL — ABNORMAL HIGH (ref 65–99)

## 2017-05-23 NOTE — Progress Notes (Signed)
Physical Therapy Session Note  Patient Details  Name: Edwin Jones MRN: [id number] Date of Birth: 07/05/1940  Today's Date: 05/23/2017 PT Individual Time: 0930-1030 PT Individual Time Calculation (min): 60 min   Short Term Goals: Week 1:  PT Short Term Goal 1 (Week 1): Pt will transition to EOB with min assist PT Short Term Goal 2 (Week 1): Pt will transfer in and out of wheelchair with mod assist PT Short Term Goal 3 (Week 1): Pt will ambulate 22' with LRAD and mod assist PT Short Term Goal 4 (Week 1): Pt will negotiate 4 steps with 2 rails and max assist  Skilled Therapeutic Interventions/Progress Updates:    Pt supine in bed upon PT arrival, agreeable to therapy tx and denies pain. Pt donned pants with total assist to loop LEs through, pt performed bridges x 2 with min assist in order to pull pants over hips. Pt transferred from supine>sitting EOB with min assist, use of bedrails and increased time, verbal cues for techniques and attention. Pt worked on seated balance with stand by assist while therapist donned socks/shoes total assist. Pt performed stand pivot transfer with mod assist, verbal cues for techniques. Pt propelled w/c using R LE and min assist x 50 ft with increased time and cues for attention to task. Pt performed x 3 sit<>stands this session with mod-max assist, in standing worked on static standing balance and maintaining midline using mirror for feedback. In standing with mirror for feedback pt worked on stepping in place, mod assist x 3 steps per LE. Attempted to perform additional standing tasks, however pt unable to get up despite max assist and shaking his head no. Pt worked on seated balance and R attention to perform reaching tasks with UEs. Pt transported back to room and transferred back to bed mod assist stand pivot with use of bedrail. Pt transferred to supine with mod assist and left supine with needs in reach.    Therapy Documentation Precautions:   Precautions Precautions: Fall Precaution Comments: apraxic, R hemi, R inattention Restrictions Weight Bearing Restrictions: No   See Function Navigator for Current Functional Status.   Therapy/Group: Individual Therapy  Netta Corrigan, PT, DPT 05/23/2017, 7:47 AM

## 2017-05-23 NOTE — Progress Notes (Signed)
Wife concerned about area in right lower mouth possibly irritated by partial plate, performed mouthcare and chlorhexadine rinse. Afebrile. MD notified

## 2017-05-23 NOTE — Progress Notes (Signed)
Subjective/Complaints: Patient slept well.  Denies pain.  ROS: Patient denies fever, rash, sore throat, blurred vision, nausea, vomiting, diarrhea, cough, shortness of breath or chest pain, joint or back pain, headache, or mood change.    Objective: Vital Signs: Blood pressure 125/63, pulse 68, temperature 98.6 F (37 C), temperature source Oral, resp. rate 16, height '6\' 3"'$  (1.905 m), weight 93.9 kg (207 lb), SpO2 99 %. No results found. Results for orders placed or performed during the hospital encounter of 05/20/17 (from the past 72 hour(s))  Glucose, capillary     Status: Abnormal   Collection Time: 05/20/17  5:03 PM  Result Value Ref Range   Glucose-Capillary 195 (H) 65 - 99 mg/dL  Glucose, capillary     Status: Abnormal   Collection Time: 05/20/17  9:14 PM  Result Value Ref Range   Glucose-Capillary 183 (H) 65 - 99 mg/dL  CBC WITH DIFFERENTIAL     Status: Abnormal   Collection Time: 05/21/17  4:31 AM  Result Value Ref Range   WBC 5.0 4.0 - 10.5 K/uL   RBC 3.59 (L) 4.22 - 5.81 MIL/uL   Hemoglobin 11.0 (L) 13.0 - 17.0 g/dL   HCT 34.2 (L) 39.0 - 52.0 %   MCV 95.3 78.0 - 100.0 fL   MCH 30.6 26.0 - 34.0 pg   MCHC 32.2 30.0 - 36.0 g/dL   RDW 13.0 11.5 - 15.5 %   Platelets 310 150 - 400 K/uL   Neutrophils Relative % 66 %   Neutro Abs 3.3 1.7 - 7.7 K/uL   Lymphocytes Relative 19 %   Lymphs Abs 1.0 0.7 - 4.0 K/uL   Monocytes Relative 11 %   Monocytes Absolute 0.6 0.1 - 1.0 K/uL   Eosinophils Relative 3 %   Eosinophils Absolute 0.2 0.0 - 0.7 K/uL   Basophils Relative 1 %   Basophils Absolute 0.0 0.0 - 0.1 K/uL    Comment: Performed at [facility], [address] [place], [place] [address]  Comprehensive metabolic panel     Status: Abnormal   Collection Time: 05/21/17  4:31 AM  Result Value Ref Range   Sodium 134 (L) 135 - 145 mmol/L   Potassium 4.5 3.5 - 5.1 mmol/L   Chloride 102 101 - 111 mmol/L   CO2 21 (L) 22 - 32 mmol/L   Glucose, Bld 198 (H) 65 - 99 mg/dL    BUN 20 6 - 20 mg/dL   Creatinine, Ser 1.75 (H) 0.61 - 1.24 mg/dL   Calcium 8.9 8.9 - 10.3 mg/dL   Total Protein 6.7 6.5 - 8.1 g/dL   Albumin 2.8 (L) 3.5 - 5.0 g/dL   AST 13 (L) 15 - 41 U/L   ALT 12 (L) 17 - 63 U/L   Alkaline Phosphatase 69 38 - 126 U/L   Total Bilirubin 0.4 0.3 - 1.2 mg/dL   GFR calc non Af Amer 36 (L) >60 mL/min   GFR calc Af Amer 42 (L) >60 mL/min    Comment: (NOTE) The eGFR has been calculated using the CKD EPI equation. This calculation has not been validated in all clinical situations. eGFR's persistently <60 mL/min signify possible Chronic Kidney Disease.    Anion gap 11 5 - 15    Comment: Performed at [facility] [address] [place], [place] [address]  Glucose, capillary     Status: Abnormal   Collection Time: 05/21/17  6:26 AM  Result Value Ref Range   Glucose-Capillary 203 (H) 65 -  99 mg/dL  Glucose, capillary     Status: Abnormal   Collection Time: 05/21/17 11:49 AM  Result Value Ref Range   Glucose-Capillary 203 (H) 65 - 99 mg/dL  Glucose, capillary     Status: Abnormal   Collection Time: 05/21/17  4:43 PM  Result Value Ref Range   Glucose-Capillary 155 (H) 65 - 99 mg/dL  Glucose, capillary     Status: Abnormal   Collection Time: 05/21/17  9:33 PM  Result Value Ref Range   Glucose-Capillary 189 (H) 65 - 99 mg/dL  Glucose, capillary     Status: Abnormal   Collection Time: 05/22/17  6:17 AM  Result Value Ref Range   Glucose-Capillary 212 (H) 65 - 99 mg/dL  Glucose, capillary     Status: Abnormal   Collection Time: 05/22/17 11:34 AM  Result Value Ref Range   Glucose-Capillary 192 (H) 65 - 99 mg/dL  Glucose, capillary     Status: Abnormal   Collection Time: 05/22/17  4:59 PM  Result Value Ref Range   Glucose-Capillary 168 (H) 65 - 99 mg/dL  Glucose, capillary     Status: Abnormal   Collection Time: 05/22/17  8:41 PM  Result Value Ref Range   Glucose-Capillary 142 (H) 65 - 99 mg/dL  Glucose, capillary     Status: Abnormal    Collection Time: 05/23/17  6:26 AM  Result Value Ref Range   Glucose-Capillary 214 (H) 65 - 99 mg/dL     HEENT: poor dentition Cardio:RRR without murmur. No JVD  Resp: CTA Bilaterally without wheezes or rales. Normal effort  GI: BS positive and non tender Extremity:  No Edema Skin:   Intact and Other portacath site intact Neuro: lethargic and aphasic.  Abnormal Motor 3/5 RUE, 4/5 RLE , 5/5 on Left side,  Right inattention.  Musc/Skel:  Other no pain with UE or LE ROM Gen NAD   Assessment/Plan: 1. 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 Physiatrist is providing close team supervision and 24 hour management of active medical problems listed below. Physiatrist and rehab team continue to assess barriers to discharge/monitor patient progress toward functional and medical goals. FIM: Function - Bathing Position: Shower Body parts bathed by patient: Right arm, Chest, Abdomen, Front perineal area, Left upper leg Body parts bathed by helper: Right upper leg, Left upper leg, Right lower leg, Buttocks, Left arm Assist Level: Touching or steadying assistance(Pt > 75%)  Function- Upper Body Dressing/Undressing What is the patient wearing?: Pull over shirt/dress Pull over shirt/dress - Perfomed by helper: Thread/unthread right sleeve, Thread/unthread left sleeve, Put head through opening, Pull shirt over trunk Assist Level: Touching or steadying assistance(Pt > 75%) Function - Lower Body Dressing/Undressing What is the patient wearing?: Non-skid slipper socks, Pants Position: Wheelchair/chair at sink Pants- Performed by helper: Pull pants up/down, Thread/unthread right pants leg, Thread/unthread left pants leg Non-skid slipper socks- Performed by helper: Don/doff left sock, Don/doff right sock Assist for footwear: Dependant Assist for lower body dressing:  Touching or steadying assistance (Pt > 75%)(Total A)  Function - Toileting Toileting steps completed by helper: Adjust clothing prior to toileting, Performs perineal hygiene, Adjust clothing after toileting Assist level: Two helpers  Function - Air cabin crew transfer activity did not occur: N/A  Function - Chair/bed transfer Chair/bed transfer method: Stand pivot Chair/bed transfer assist level: Maximal assist (Pt 25 - 49%/lift and lower) Chair/bed transfer assistive device: Armrests Chair/bed  transfer details: Verbal cues for technique, Manual facilitation for weight shifting  Function - Locomotion: Wheelchair Type: Manual Max wheelchair distance: 100' Assist Level: Supervision or verbal cues Assist Level: Supervision or verbal cues Function - Locomotion: Ambulation Assistive device: No device, Hand held assist Max distance: 51' Assist level: Maximal assist (Pt 25 - 49%) Assist level: Maximal assist (Pt 25 - 49%) Walk 50 feet with 2 turns activity did not occur: Safety/medical concerns Walk 150 feet activity did not occur: Safety/medical concerns Walk 10 feet on uneven surfaces activity did not occur: Safety/medical concerns  Function - Comprehension Comprehension: Auditory Comprehension assist level: Understands basic 25 - 49% of the time/ requires cueing 50 - 75% of the time  Function - Expression Expression: Verbal Expression assist level: Expresses basis less than 25% of the time/requires cueing >75% of the time.  Function - Social Interaction Social Interaction assist level: Interacts appropriately 25 - 49% of time - Needs frequent redirection.  Function - Problem Solving Problem solving assist level: Solves basic 25 - 49% of the time - needs direction more than half the time to initiate, plan or complete simple activities  Function - Memory Memory assist level: Recognizes or recalls 25 - 49% of the time/requires cueing 50 - 75% of the time Patient  normally able to recall (first 3 days only): That he or she is in a hospital, Staff names and faces(d/t SLP has worked with pt numerous times)  Medical Problem List and Plan: 1.   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 DVT Prophylaxis/Anticoagulation: Pharmaceutical:Heparin--continue 3. Pain Management:tylenol prn 4. Mood:LCSW to follow for evaluation when appropriate. 5. Neuropsych: This patientis notcapable of making decisions on hisown behalf. 6. Skin/Wound Care:routine pressure relief measures. 7. Fluids/Electrolytes/Nutrition:Monitor I/O. Continue to offer supplements between meals. 8.Hypertension:Hypotensionhs resolved and patient offMidodrine and Florinef 9. Acute on chronic NGE:[id number] SCr- 1.8-1.9. Improving with hydration.  10. ABLA: Monitor H/H for signs of bleedinghas been in 10- 11 range. 11 on 3/15 11. LLL/LUL GMW:[id number] has been completed   f/U CXR on 3/14 without much change.  12. Lethargy:  Trial Ritalin with some improvement in arousal noted 13. Oral thrush:  nystatin mouthwash as well as oral care every 4 hours while awake. 14. COPD/Left lung cancer: On Breo with albuterol prn.  15.DM: poorly controlled     -SSI   -added low dose amaryl on 3/16   LOS (Days) 3 A FACE TO FACE EVALUATION WAS PERFORMED  Meredith Staggers 05/23/2017, 8:23 AM

## 2017-05-24 ENCOUNTER — Inpatient Hospital Stay (HOSPITAL_COMMUNITY): Payer: Medicare Other | Admitting: Occupational Therapy

## 2017-05-24 ENCOUNTER — Inpatient Hospital Stay (HOSPITAL_COMMUNITY): Payer: Medicare Other | Admitting: Speech Pathology

## 2017-05-24 ENCOUNTER — Inpatient Hospital Stay (HOSPITAL_COMMUNITY): Payer: Medicare Other | Admitting: Physical Therapy

## 2017-05-24 ENCOUNTER — Encounter (HOSPITAL_COMMUNITY): Payer: Self-pay | Admitting: Physical Medicine and Rehabilitation

## 2017-05-24 LAB — GLUCOSE, CAPILLARY
GLUCOSE-CAPILLARY: 181 mg/dL — AB (ref 65–99)
Glucose-Capillary: 210 mg/dL — ABNORMAL HIGH (ref 65–99)
Glucose-Capillary: 221 mg/dL — ABNORMAL HIGH (ref 65–99)
Glucose-Capillary: 245 mg/dL — ABNORMAL HIGH (ref 65–99)

## 2017-05-24 MED ORDER — FLEET ENEMA 7-19 GM/118ML RE ENEM
1.0000 | ENEMA | Freq: Once | RECTAL | Status: AC
  Administered 2017-05-24: 1 via RECTAL
  Filled 2017-05-24: qty 1

## 2017-05-24 MED ORDER — CHLORHEXIDINE GLUCONATE 0.12 % MT SOLN
15.0000 mL | Freq: Four times a day (QID) | OROMUCOSAL | Status: DC
  Administered 2017-05-24 – 2017-06-15 (×82): 15 mL via OROMUCOSAL
  Filled 2017-05-24 (×81): qty 15

## 2017-05-24 NOTE — Progress Notes (Signed)
Speech Language Pathology Daily Session Note  Patient Details  Name: Edwin Jones MRN: [id number] Date of Birth: 13-Dec-1940  Today's Date: 05/24/2017 SLP Individual Time: 0730-0830 SLP Individual Time Calculation (min): 60 min  Short Term Goals: Week 1: SLP Short Term Goal 1 (Week 1): Pt will demonstrate use of call light with Max A cues for 50% of opportunities.  SLP Short Term Goal 2 (Week 1): Pt will select requested object in field of 2 with Max A cues in 8 out of 10 opportunities.  SLP Short Term Goal 3 (Week 1): Pt will name common object in 8 of 10 opportunities with Max A cues.  SLP Short Term Goal 4 (Week 1): Pt will follow 1 step simple directions in 8 out of 10 opportunities with Max A cues.  SLP Short Term Goal 5 (Week 1): Pt will answer basic yes/no question related to himself in 5 out of 10 opportunities with Max A cues.  SLP Short Term Goal 6 (Week 1): Pt will consume dysphagia 2 diet with thin liquids and minimal overt s/s of aspiration and Mod A cues for use of compensatory swallow strategies.   Skilled Therapeutic Interventions: Skilled treatment session focused on dysphagia and communication goals. SLP facilitated session by providing skilled observation of pt consuming breakfast. Pt consumed dys. 2 textures without overt s/s of aspiration and thin liquids via straw with throat clearing x1 throughout the meal, suspect due to large sequential sips. Pt required Mod A verbal cues to self-monitor right anterior spillage and right pocketing. Pt answered basic yes/no questions in regards to his wants/needs for tray set-up with 25% accuracy throughout the session. Pt left upright in bed with alarm on and all needs within reach. Continue with current plan of care.      Function:  Eating Eating   Modified Consistency Diet: Yes Eating Assist Level: Set up assist for;Supervision or verbal cues;More than reasonable amount of time;Helper checks for pocketed food;Help managing  cup/glass;Help with picking up utensils;Helper scoops food on utensil   Eating Set Up Assist For: Opening containers Helper [place] on Utensil: Occasionally     Cognition Comprehension Comprehension assist level: Understands basic less than 25% of the time/ requires cueing >75% of the time  Expression   Expression assist level: Expresses basis less than 25% of the time/requires cueing >75% of the time.  Social Interaction Social Interaction assist level: Interacts appropriately 25 - 49% of time - Needs frequent redirection.  Problem Solving Problem solving assist level: Solves basic 25 - 49% of the time - needs direction more than half the time to initiate, plan or complete simple activities  Memory Memory assist level: Recognizes or recalls 25 - 49% of the time/requires cueing 50 - 75% of the time    Pain Pain Assessment Pain Assessment: No/denies pain   Therapy/Group: Individual Therapy  Meredeth Ide  SLP - Student 05/24/2017, 12:12 PM

## 2017-05-24 NOTE — Progress Notes (Signed)
Occupational Therapy Session Note  Patient Details  Name: Edwin Jones MRN: [id number] Date of Birth: July 05, 1940  Today's Date: 05/24/2017 OT Individual Time: 2876-8115 OT Individual Time Calculation (min): 57 min   Short Term Goals: Week 1:  OT Short Term Goal 1 (Week 1): Pt will be able to use R hand to bring wash cloth to face with min A. OT Short Term Goal 2 (Week 1): Pt will demonstrate improved processing by donning a shirt in 1-2 minutes (vs 5 minutes). OT Short Term Goal 3 (Week 1): Pt will demonstrate improved dynamic sitting balance by donning pants over R leg from a seated position with Min A OT Short Term Goal 4 (Week 1): Pt will demonstrate improved activity tolerance to stand at the sink with min A to complete oral care.  Skilled Therapeutic Interventions/Progress Updates:    OT treatment session focused on functional use of  R UE, R attention, and initiation. Pt greeted sitting in wc and agreeable to OT. Pt brought to [facility] gym and worked on visual scanning to L and R to locate red buttons. Pt needed mod cues to locate buttons on R side. Incorporated forced use of R UE by holding L UE down initially, then pt initiated use of R UE only. Pt needed hand over hand A to facilitate finger isolation, and bring R UE fully to board 2/2 shoulder weakness but improved initiation with repetition. Pt needed max multimodal cues initially to look to R side, but progressed to mod cues.  Pt returned to room and worked on R attention and initiation with hand washing task. Pt with ideational apraxia and grabbed comb when asked to wash hands. Hand over hand A to integrate R UE into hand washing and max instructional cues to initiate, sequence, and terminate task 2/2 apraxia. Pt left seated in wc at end of session with lunch tray and nursing staff present.   Therapy Documentation Precautions:  Precautions Precautions: Fall Precaution Comments: apraxic, R hemi, R inattention Restrictions Weight  Bearing Restrictions: No Pain: Pain Assessment Pain Assessment: No/denies pain Pain Score: 0-No pain ADL: ADL ADL Comments: Please see functional navigator  See Function Navigator for Current Functional Status.   Therapy/Group: Individual Therapy  Valma Cava 05/24/2017, 11:50 AM

## 2017-05-24 NOTE — Care Management Note (Signed)
Inpatient [facility] Individual Statement of Services  Patient Name:  Edwin Jones  Date:  05/24/2017  Welcome to the [facility].  Our goal is to provide you with an individualized program based on your diagnosis and situation, designed to meet your specific needs.  With this comprehensive rehabilitation program, you will be expected to participate in at least 3 hours of rehabilitation therapies Monday-Friday, with modified therapy programming on the weekends.  Your rehabilitation program will include the following services:  Physical Therapy (PT), Occupational Therapy (OT), Speech Therapy (ST), 24 hour per day rehabilitation nursing, Therapeutic Recreaction (TR), Neuropsychology, Case Management (Social Worker), Rehabilitation Medicine, Nutrition Services and Pharmacy Services  Weekly team conferences will be held on Wednesday to discuss your progress.  Your Social Worker will talk with you frequently to get your input and to update you on team discussions.  Team conferences with you and your family in attendance may also be held.  Expected length of stay: 24-28 days  Overall anticipated outcome: supervision with cueing  Depending on your progress and recovery, your program may change. Your Social Worker will coordinate services and will keep you informed of any changes. Your Social Worker's name and contact numbers are listed  below.  The following services may also be recommended but are not provided by the [facility] will be made to provide these services after discharge if needed.  Arrangements include referral to agencies that provide these services.  Your insurance has been verified to be:  Medicare Part A & B Your primary doctor is:  Delight Stare  Pertinent information will be shared with your doctor and your  insurance company.  Social Worker:  Ovidio Kin, [place] or ([phone]  Information discussed with and copy given to patient by: Elease Hashimoto, 05/24/2017, 8:33 AM

## 2017-05-24 NOTE — Plan of Care (Signed)
  Progressing RH BOWEL ELIMINATION RH STG MANAGE BOWEL WITH ASSISTANCE Description STG Manage Bowel with Assistance. 05/24/2017 1226 - Progressing by Glean Salen, RN Flowsheets Taken 05/24/2017 1226  STG: Pt will manage bowels with assistance 1-Total assistance RH STG MANAGE BOWEL W/MEDICATION W/ASSISTANCE Description STG Manage Bowel with Medication with Assistance. 05/24/2017 1226 - Progressing by Glean Salen, RN Flowsheets Taken 05/24/2017 1226  STG: Pt will manage bowels with medication with assistance 1-Total assistance RH BLADDER ELIMINATION RH STG MANAGE BLADDER WITH ASSISTANCE Description STG Manage Bladder With mod Assistance  05/24/2017 1226 - Progressing by Glean Salen, RN Flowsheets Taken 05/24/2017 1226  STG: Pt will manage bladder with assistance 1-Total assistance RH SKIN INTEGRITY RH STG SKIN FREE OF INFECTION/BREAKDOWN Description Skin free of infection/breakdown with min assistance  05/24/2017 1226 - Progressing by Glean Salen, RN RH STG MAINTAIN SKIN INTEGRITY WITH ASSISTANCE Description STG Maintain Skin Integrity With min Assistance.  05/24/2017 1226 - Progressing by Glean Salen, RN Flowsheets Taken 05/24/2017 1226  STG: Maintain skin integrity with assistance 3-Moderate assistance RH SAFETY RH STG ADHERE TO SAFETY PRECAUTIONS W/ASSISTANCE/DEVICE Description STG Adhere to Safety Precautions With min  Assistance/Device.  05/24/2017 1226 - Progressing by Glean Salen, RN Flowsheets Taken 05/24/2017 1226  STG:Pt will adhere to safety precautions with assistance/device 3-Moderate assistance RH STG DECREASED RISK OF FALL WITH ASSISTANCE Description STG Decreased Risk of Fall With min Assistance.  05/24/2017 1226 - Progressing by Glean Salen, RN

## 2017-05-24 NOTE — Progress Notes (Signed)
Physical Therapy Session Note  Patient Details  Name: Edwin Jones MRN: [id number] Date of Birth: 14-Mar-1940  Today's Date: 05/24/2017 PT Individual Time: 1015-1130 PT Individual Time Calculation (min): 75 min   Short Term Goals: Week 1:  PT Short Term Goal 1 (Week 1): Pt will transition to EOB with min assist PT Short Term Goal 2 (Week 1): Pt will transfer in and out of wheelchair with mod assist PT Short Term Goal 3 (Week 1): Pt will ambulate 23' with LRAD and mod assist PT Short Term Goal 4 (Week 1): Pt will negotiate 4 steps with 2 rails and max assist  Skilled Therapeutic Interventions/Progress Updates:    no c/o pain based on faces scale.  Pt non-verbal throughout session, only nodding yes/no.  Session focus on initiation, motor planning, and functional mobility.    Pt requires increased time and min assist to come to EOB.  Dynamic sitting balance for lower body dressing EOB with supervision, max assist for sit<>stand and pt pulls pants over hips.    Max assist for sit<>stand throughout session with cues for forward weight shift and pushing through LUE.  Static standing with RW and mirror feedback x30' with cues for activation of R quad and L weight shift.  Dynamic seated balance with supervision focus on motor planning, initiation, and forward weight shift for horseshoe task.  Pt requires max cues for initiation and increased time to complete task.    PT switched out pt's w/c for taller seat>floor height for improved sitting tolerance.  Mod assist for stand/pivot back to w/c at end of session and positioned in room with QRB in place, call bell in reach and needs met.   Therapy Documentation Precautions:  Precautions Precautions: Fall Precaution Comments: apraxic, R hemi, R inattention Restrictions Weight Bearing Restrictions: No   See Function Navigator for Current Functional Status.   Therapy/Group: Individual Therapy  Michel Santee 05/24/2017, 11:16 AM

## 2017-05-24 NOTE — Progress Notes (Signed)
Subjective/Complaints: No issues per RN except family took dentures and we concerned with sore in mouth Pt not reliable with Y/N- currently eating breakfast with SLP  ROS: Patient denies fever, rash, sore throat, blurred vision, nausea, vomiting, diarrhea, cough, shortness of breath or chest pain, joint or back pain, headache, or mood change.    Objective: Vital Signs: Blood pressure (!) 148/80, pulse 70, temperature 98 F (36.7 C), temperature source Oral, resp. rate 16, height 6\' 3"  (1.905 m), weight 93.9 kg (207 lb), SpO2 98 %. No results found. Results for orders placed or performed during the hospital encounter of 05/20/17 (from the past 72 hour(s))  Glucose, capillary     Status: Abnormal   Collection Time: 05/21/17 11:49 AM  Result Value Ref Range   Glucose-Capillary 203 (H) 65 - 99 mg/dL  Glucose, capillary     Status: Abnormal   Collection Time: 05/21/17  4:43 PM  Result Value Ref Range   Glucose-Capillary 155 (H) 65 - 99 mg/dL  Glucose, capillary     Status: Abnormal   Collection Time: 05/21/17  9:33 PM  Result Value Ref Range   Glucose-Capillary 189 (H) 65 - 99 mg/dL  Glucose, capillary     Status: Abnormal   Collection Time: 05/22/17  6:17 AM  Result Value Ref Range   Glucose-Capillary 212 (H) 65 - 99 mg/dL  Glucose, capillary     Status: Abnormal   Collection Time: 05/22/17 11:34 AM  Result Value Ref Range   Glucose-Capillary 192 (H) 65 - 99 mg/dL  Glucose, capillary     Status: Abnormal   Collection Time: 05/22/17  4:59 PM  Result Value Ref Range   Glucose-Capillary 168 (H) 65 - 99 mg/dL  Glucose, capillary     Status: Abnormal   Collection Time: 05/22/17  8:41 PM  Result Value Ref Range   Glucose-Capillary 142 (H) 65 - 99 mg/dL  Glucose, capillary     Status: Abnormal   Collection Time: 05/23/17  6:26 AM  Result Value Ref Range   Glucose-Capillary 214 (H) 65 - 99 mg/dL  Glucose, capillary     Status: Abnormal   Collection Time: 05/23/17 11:37 AM  Result  Value Ref Range   Glucose-Capillary 221 (H) 65 - 99 mg/dL  Glucose, capillary     Status: Abnormal   Collection Time: 05/23/17  4:52 PM  Result Value Ref Range   Glucose-Capillary 135 (H) 65 - 99 mg/dL  Glucose, capillary     Status: Abnormal   Collection Time: 05/23/17  9:16 PM  Result Value Ref Range   Glucose-Capillary 176 (H) 65 - 99 mg/dL  Glucose, capillary     Status: Abnormal   Collection Time: 05/24/17  6:30 AM  Result Value Ref Range   Glucose-Capillary 210 (H) 65 - 99 mg/dL     HEENT: poor dentition Cardio:RRR without murmur. No JVD  Resp: CTA Bilaterally without wheezes or rales. Normal effort  GI: BS positive and non tender Extremity:  No Edema Skin:   Intact and Other portacath site intact Neuro: lethargic and aphasic.  Abnormal Motor 3/5 RUE, 4/5 RLE , 5/5 on Left side,  Right inattention.  Musc/Skel:  Other no pain with UE or LE ROM Gen NAD   Assessment/Plan: 1. 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 Physiatrist is providing close team supervision and 24 hour management of active medical problems listed below. Physiatrist and  rehab team continue to assess barriers to discharge/monitor patient progress toward functional and medical goals. FIM: Function - Bathing Position: Shower Body parts bathed by patient: Right arm, Chest, Abdomen, Front perineal area, Left upper leg Body parts bathed by helper: Right upper leg, Left upper leg, Right lower leg, Buttocks, Left arm Assist Level: Touching or steadying assistance(Pt > 75%)  Function- Upper Body Dressing/Undressing What is the patient wearing?: Pull over shirt/dress Pull over shirt/dress - Perfomed by helper: Thread/unthread right sleeve, Thread/unthread left sleeve, Put head through opening, Pull shirt over trunk Assist Level: Touching or steadying assistance(Pt >  75%) Function - Lower Body Dressing/Undressing What is the patient wearing?: Non-skid slipper socks, Pants Position: Wheelchair/chair at sink Pants- Performed by helper: Pull pants up/down, Thread/unthread right pants leg, Thread/unthread left pants leg Non-skid slipper socks- Performed by helper: Don/doff left sock, Don/doff right sock Assist for footwear: Dependant Assist for lower body dressing: Touching or steadying assistance (Pt > 75%)(Total A)  Function - Toileting Toileting steps completed by helper: Adjust clothing prior to toileting, Performs perineal hygiene, Adjust clothing after toileting Assist level: Two helpers  Function - Air cabin crew transfer activity did not occur: N/A Toilet transfer assistive device: Grab bar Assist level to toilet: 2 helpers Assist level from toilet: 2 helpers  Function - Chair/bed transfer Chair/bed transfer method: Stand pivot Chair/bed transfer assist level: Moderate assist (Pt 50 - 74%/lift or lower) Chair/bed transfer assistive device: Armrests Chair/bed transfer details: Verbal cues for technique, Manual facilitation for weight shifting  Function - Locomotion: Wheelchair Type: Manual Max wheelchair distance: 50 ft Assist Level: Touching or steadying assistance (Pt > 75%) Assist Level: Touching or steadying assistance (Pt > 75%) Function - Locomotion: Ambulation Assistive device: No device, Hand held assist Max distance: 43' Assist level: Maximal assist (Pt 25 - 49%) Assist level: Maximal assist (Pt 25 - 49%) Walk 50 feet with 2 turns activity did not occur: Safety/medical concerns Walk 150 feet activity did not occur: Safety/medical concerns Walk 10 feet on uneven surfaces activity did not occur: Safety/medical concerns  Function - Comprehension Comprehension: Auditory Comprehension assist level: Understands basic 25 - 49% of the time/ requires cueing 50 - 75% of the time  Function - Expression Expression:  Verbal Expression assist level: Expresses basis less than 25% of the time/requires cueing >75% of the time.  Function - Social Interaction Social Interaction assist level: Interacts appropriately 25 - 49% of time - Needs frequent redirection.  Function - Problem Solving Problem solving assist level: Solves basic 25 - 49% of the time - needs direction more than half the time to initiate, plan or complete simple activities  Function - Memory Memory assist level: Recognizes or recalls 25 - 49% of the time/requires cueing 50 - 75% of the time Patient normally able to recall (first 3 days only): That he or she is in a hospital, Staff names and faces(d/t SLP has worked with pt numerous times)  Medical Problem List and Plan: 1.   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 DVT Prophylaxis/Anticoagulation: Pharmaceutical:Heparin--continue 3. Pain Management:tylenol prn 4. Mood:LCSW to follow for evaluation when appropriate. 5. Neuropsych: This patientis notcapable of making decisions on hisown behalf. 6. Skin/Wound Care:routine pressure relief measures. 7. Fluids/Electrolytes/Nutrition:Monitor I/O. Continue to offer supplements between meals. 8.Hypertension:Hypotensionhs resolved and patient offMidodrine and Florinef 9. Acute on chronic WEX:[id number] SCr- 1.8-1.9. Improving with hydration.  10. ABLA: Monitor H/H for signs of bleedinghas been in 10- 11 range.  11 on 3/15 11. LLL/LUL OEC:[id number] has been completed   f/U CXR on 3/14 without much change.  12. Lethargy:  Trial Ritalin with some improvement in arousal noted 13. Oral thrush:  nystatin mouthwash as well as oral care every 4 hours while awake. 14. COPD/Left lung cancer: On Breo with albuterol prn.  15.DM: poorly controlled     -SSI   -added low dose amaryl on 3/16   LOS (Days) 4 A FACE TO FACE EVALUATION WAS PERFORMED  Charlett Blake 05/24/2017,  8:05 AM

## 2017-05-25 ENCOUNTER — Inpatient Hospital Stay (HOSPITAL_COMMUNITY): Payer: Medicare Other

## 2017-05-25 ENCOUNTER — Inpatient Hospital Stay (HOSPITAL_COMMUNITY): Payer: Medicare Other | Admitting: Speech Pathology

## 2017-05-25 ENCOUNTER — Inpatient Hospital Stay (HOSPITAL_COMMUNITY): Payer: Medicare Other | Admitting: Occupational Therapy

## 2017-05-25 ENCOUNTER — Inpatient Hospital Stay (HOSPITAL_COMMUNITY): Payer: Medicare Other | Admitting: Physical Therapy

## 2017-05-25 LAB — GLUCOSE, CAPILLARY
GLUCOSE-CAPILLARY: 208 mg/dL — AB (ref 65–99)
GLUCOSE-CAPILLARY: 140 mg/dL — AB (ref 65–99)
Glucose-Capillary: 179 mg/dL — ABNORMAL HIGH (ref 65–99)
Glucose-Capillary: 224 mg/dL — ABNORMAL HIGH (ref 65–99)

## 2017-05-25 MED ORDER — GLIMEPIRIDE 2 MG PO TABS
2.0000 mg | ORAL_TABLET | Freq: Every day | ORAL | Status: DC
  Administered 2017-05-26 – 2017-05-27 (×2): 2 mg via ORAL
  Filled 2017-05-25 (×2): qty 1

## 2017-05-25 NOTE — Progress Notes (Signed)
Subjective/Complaints: Pt more consistent with Y/N   ROS: Patient denies fever, rash, sore throat, blurred vision, nausea, vomiting, diarrhea, cough, shortness of breath or chest pain, joint or back pain, headache, or mood change.    Objective: Vital Signs: Blood pressure (!) 99/54, pulse 84, temperature 98.6 F (37 C), temperature source Oral, resp. rate 16, height 6\' 3"  (1.905 m), weight 93.9 kg (207 lb), SpO2 99 %. No results found. Results for orders placed or performed during the hospital encounter of 05/20/17 (from the past 72 hour(s))  Glucose, capillary     Status: Abnormal   Collection Time: 05/22/17  4:59 PM  Result Value Ref Range   Glucose-Capillary 168 (H) 65 - 99 mg/dL  Glucose, capillary     Status: Abnormal   Collection Time: 05/22/17  8:41 PM  Result Value Ref Range   Glucose-Capillary 142 (H) 65 - 99 mg/dL  Glucose, capillary     Status: Abnormal   Collection Time: 05/23/17  6:26 AM  Result Value Ref Range   Glucose-Capillary 214 (H) 65 - 99 mg/dL  Glucose, capillary     Status: Abnormal   Collection Time: 05/23/17 11:37 AM  Result Value Ref Range   Glucose-Capillary 221 (H) 65 - 99 mg/dL  Glucose, capillary     Status: Abnormal   Collection Time: 05/23/17  4:52 PM  Result Value Ref Range   Glucose-Capillary 135 (H) 65 - 99 mg/dL  Glucose, capillary     Status: Abnormal   Collection Time: 05/23/17  9:16 PM  Result Value Ref Range   Glucose-Capillary 176 (H) 65 - 99 mg/dL  Glucose, capillary     Status: Abnormal   Collection Time: 05/24/17  6:30 AM  Result Value Ref Range   Glucose-Capillary 210 (H) 65 - 99 mg/dL  Glucose, capillary     Status: Abnormal   Collection Time: 05/24/17 12:19 PM  Result Value Ref Range   Glucose-Capillary 221 (H) 65 - 99 mg/dL  Glucose, capillary     Status: Abnormal   Collection Time: 05/24/17  4:24 PM  Result Value Ref Range   Glucose-Capillary 181 (H) 65 - 99 mg/dL  Glucose, capillary     Status: Abnormal   Collection  Time: 05/24/17  9:55 PM  Result Value Ref Range   Glucose-Capillary 245 (H) 65 - 99 mg/dL  Glucose, capillary     Status: Abnormal   Collection Time: 05/25/17  6:30 AM  Result Value Ref Range   Glucose-Capillary 208 (H) 65 - 99 mg/dL  Glucose, capillary     Status: Abnormal   Collection Time: 05/25/17 11:42 AM  Result Value Ref Range   Glucose-Capillary 179 (H) 65 - 99 mg/dL     HEENT: poor dentition Cardio:RRR without murmur. No JVD  Resp: CTA Bilaterally without wheezes or rales. Normal effort  GI: BS positive and non tender Extremity:  No Edema Skin:   Intact and Other portacath site intact Neuro: lethargic and aphasic.  Abnormal Motor 3/5 RUE, 4/5 RLE , 5/5 on Left side,  Right inattention.  Musc/Skel:  Other no pain with UE or LE ROM Gen NAD   Assessment/Plan: 1. 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 Physiatrist is providing close team supervision and 24 hour management of active medical problems listed below. Physiatrist and rehab team continue to assess barriers to discharge/monitor patient progress toward functional and medical goals. FIM: Function - Bathing  Position: Shower Body parts bathed by patient: Right arm, Chest, Abdomen, Front perineal area, Left upper leg Body parts bathed by helper: Right upper leg, Left upper leg, Right lower leg, Buttocks, Left arm Assist Level: (moderate assist)  Function- Upper Body Dressing/Undressing What is the patient wearing?: Pull over shirt/dress Pull over shirt/dress - Perfomed by helper: Thread/unthread right sleeve, Thread/unthread left sleeve, Put head through opening, Pull shirt over trunk Assist Level: (helper did all, total assist) Function - Lower Body Dressing/Undressing What is the patient wearing?: Non-skid slipper socks, Pants Position: Wheelchair/chair at sink Pants- Performed by  helper: Pull pants up/down, Thread/unthread right pants leg, Thread/unthread left pants leg Non-skid slipper socks- Performed by helper: Don/doff left sock, Don/doff right sock Assist for footwear: Dependant Assist for lower body dressing: (Total A)  Function - Toileting Toileting activity did not occur: No continent bowel/bladder event Toileting steps completed by helper: Adjust clothing prior to toileting, Performs perineal hygiene, Adjust clothing after toileting Assist level: Two helpers  Function - Air cabin crew transfer activity did not occur: N/A Toilet transfer assistive device: Grab bar Assist level to toilet: 2 helpers Assist level from toilet: 2 helpers  Function - Chair/bed transfer Chair/bed transfer method: Stand pivot Chair/bed transfer assist level: Moderate assist (Pt 50 - 74%/lift or lower) Chair/bed transfer assistive device: Armrests Chair/bed transfer details: Verbal cues for technique, Manual facilitation for weight shifting  Function - Locomotion: Wheelchair Will patient use wheelchair at discharge?: Yes Type: Manual Max wheelchair distance: 150 Assist Level: Supervision or verbal cues Assist Level: Supervision or verbal cues Assist Level: Supervision or verbal cues Turns around,maneuvers to table,bed, and toilet,negotiates 3% grade,maneuvers on rugs and over doorsills: No Function - Locomotion: Ambulation Assistive device: No device, Hand held assist Max distance: 65' Assist level: Maximal assist (Pt 25 - 49%) Assist level: Maximal assist (Pt 25 - 49%) Walk 50 feet with 2 turns activity did not occur: Safety/medical concerns Walk 150 feet activity did not occur: Safety/medical concerns Walk 10 feet on uneven surfaces activity did not occur: Safety/medical concerns  Function - Comprehension Comprehension: Auditory Comprehension assist level: Understands basic 25 - 49% of the time/ requires cueing 50 - 75% of the time  Function -  Expression Expression: Verbal, Nonverbal Expression assist level: Expresses basis less than 25% of the time/requires cueing >75% of the time.  Function - Social Interaction Social Interaction assist level: Interacts appropriately 25 - 49% of time - Needs frequent redirection.  Function - Problem Solving Problem solving assist level: Solves basic less than 25% of the time - needs direction nearly all the time or does not effectively solve problems and may need a restraint for safety  Function - Memory Memory assist level: Recognizes or recalls less than 25% of the time/requires cueing greater than 75% of the time Patient normally able to recall (first 3 days only): That he or she is in a hospital  Medical Problem List and Plan: 1.   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 DVT Prophylaxis/Anticoagulation: Pharmaceutical:Heparin--continue 3. Pain Management:tylenol prn 4. Mood:LCSW to follow for evaluation when appropriate. 5. Neuropsych: This patientis notcapable of making decisions on hisown behalf. 6. Skin/Wound Care:routine pressure relief measures. 7. Fluids/Electrolytes/Nutrition:Monitor I/O. Continue to offer supplements between meals. 8.Hypertension:Hypotensionhs resolved and patient offMidodrine and Florinef 9. 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 at baseline BMP Latest Ref Rng & Units 05/21/2017 05/20/2017 05/19/2017  Glucose 65 - 99 mg/dL 198(H) 171(H)  196(H)  BUN 6 - 20 mg/dL 20 15 16   Creatinine 0.61 - 1.24 mg/dL 1.75(H) 1.87(H) 1.88(H)  Sodium 135 - 145 mmol/L 134(L) 135 135  Potassium 3.5 - 5.1 mmol/L 4.5 4.3 4.5  Chloride 101 - 111 mmol/L 102 104 105  CO2 22 - 32 mmol/L 21(L) 22 21(L)  Calcium 8.9 - 10.3 mg/dL 8.9 8.6(L) 8.7(L)   10. ABLA: Monitor H/H for signs of bleedinghas been in 10- 11 range. 11 on 3/15 11. LLL/LUL TLX:BWIOMBTD has been completed   f/U CXR on  3/14 without much change.  12. Lethargy:  Trial Ritalin with some improvement in arousal noted 13. Oral thrush:  nystatin mouthwash as well as oral care every 4 hours while awake. 14. COPD/Left lung cancer: On Breo with albuterol prn.  15.DM: poorly controlled     -SSI   -added low dose amaryl on 3/16, increase to 2mg  on 3/19 CBG (last 3)  Recent Labs    05/24/17 2155 05/25/17 0630 05/25/17 1142  GLUCAP 245* 208* 179*    LOS (Days) 5 A FACE TO FACE EVALUATION WAS PERFORMED  Luanna Salk Xerxes Agrusa 05/25/2017, 1:12 PM

## 2017-05-25 NOTE — Progress Notes (Signed)
Speech Language Pathology Daily Session Note  Patient Details  Name: Edwin Jones MRN: [id number] Date of Birth: Oct 08, 1940  Today's Date: 05/25/2017 SLP Individual Time: 4944-9675 SLP Individual Time Calculation (min): 60 min  Short Term Goals: Week 1: SLP Short Term Goal 1 (Week 1): Pt will demonstrate use of call light with Max A cues for 50% of opportunities.  SLP Short Term Goal 2 (Week 1): Pt will select requested object in field of 2 with Max A cues in 8 out of 10 opportunities.  SLP Short Term Goal 3 (Week 1): Pt will name common object in 8 of 10 opportunities with Max A cues.  SLP Short Term Goal 4 (Week 1): Pt will follow 1 step simple directions in 8 out of 10 opportunities with Max A cues.  SLP Short Term Goal 5 (Week 1): Pt will answer basic yes/no question related to himself in 5 out of 10 opportunities with Max A cues.  SLP Short Term Goal 6 (Week 1): Pt will consume dysphagia 2 diet with thin liquids and minimal overt s/s of aspiration and Mod A cues for use of compensatory swallow strategies.   Skilled Therapeutic Interventions: Skilled treatment session focused on dysphagia and communication goals. Upon arrival, pt was consuming breakfast in bed. Pt consumed dys. 2 textures without overt s/s of aspiration and thin liquids via straw with overt cough x1, suspect due to head position (hyperextension). Pt also demonstrated right anterior spillage and right pocketing, and required Mod A verbal cues to self-monitor and use swallowing compensatory strategies. 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 Pt was able to follow 1-step directions with 100% accuracy with Max A multimodal cues. Pt left supine in bed with alarm on and all needs within reach. Continue with current plan of care.      Function:  Eating Eating    Modified Consistency Diet: Yes Eating Assist Level: Set up assist for;Supervision or verbal cues;More than reasonable amount of time;Helper checks for pocketed food;Help with picking up utensils;Help managing cup/glass   Eating Set Up Assist For: Opening containers       Cognition Comprehension Comprehension assist level: Understands basic 25 - 49% of the time/ requires cueing 50 - 75% of the time  Expression   Expression assist level: Expresses basis less than 25% of the time/requires cueing >75% of the time.  Social Interaction Social Interaction assist level: Interacts appropriately 25 - 49% of time - Needs frequent redirection.  Problem Solving Problem solving assist level: Solves basic less than 25% of the time - needs direction nearly all the time or does not effectively solve problems and may need a restraint for safety  Memory Memory assist level: Recognizes or recalls less than 25% of the time/requires cueing greater than 75% of the time    Pain Pain Assessment Pain Assessment: No/denies pain   Therapy/Group: Individual Therapy  Meredeth Ide  SLP - Student 05/25/2017, 12:35 PM

## 2017-05-25 NOTE — Progress Notes (Signed)
Physical Therapy Session Note  Patient Details  Name: Edwin Jones MRN: [id number] Date of Birth: [birth year]  Today's Date: 05/25/2017 PT Individual Time: 1000-1045 PT Individual Time Calculation (min): 45 min   Short Term Goals: Week 1:  PT Short Term Goal 1 (Week 1): Pt will transition to EOB with min assist PT Short Term Goal 2 (Week 1): Pt will transfer in and out of wheelchair with mod assist PT Short Term Goal 3 (Week 1): Pt will ambulate 31' with LRAD and mod assist PT Short Term Goal 4 (Week 1): Pt will negotiate 4 steps with 2 rails and max assist  Skilled Therapeutic Interventions/Progress Updates:    no c/o pain.  Session focus on motor planning for dressing, transfers, and w/c mobility.    Pt incontinent of bladder on arrival, rolling R/L for removal of brief and total assist for hygiene.  Pt completes lower body dressing at bed level with assist to thread LEs and to pull pants over R hip.  Total assist for donning shoes 2/2 time management.  Supine>sitting EOB with mod assist to elevate trunk.  Mod assist for stand/pivot to w/c on L.  PT adjusted brakes on w/c for safety.  Pt propelled w/c back to room with L hemi-technique and min verbal cues for attention to R visual field.  Pt positioned upright in w/c at end of session and positioned with QRB in place, call bell in reach and needs met.   Therapy Documentation Precautions:  Precautions Precautions: Fall Precaution Comments: apraxic, R hemi, R inattention Restrictions Weight Bearing Restrictions: No    See Function Navigator for Current Functional Status.   Therapy/Group: Individual Therapy  Michel Santee 05/25/2017, 10:46 AM

## 2017-05-25 NOTE — Progress Notes (Signed)
Social Work Assessment and Plan Social Work Assessment and Plan  Patient Details  Name: Edwin Jones MRN: [id number] Date of Birth: [birth year]  Today's Date: 05/25/2017  Problem List:  Patient Active Problem List   Diagnosis Date Noted  . SOB (shortness of breath) on exertion   . Hemiparesis affecting right side as late effect of stroke ([place])   . Aphasia as late effect of stroke   . Small cell lung cancer, left upper lobe ([place])   . Nontraumatic acute hemorrhage of basal ganglia ([place]) 05/17/2017  . Anterior cerebral circulation hemorrhagic infarction 05/17/2017  . Pneumonia   . Cerebral edema (HCC)   . Cough   . Fever, unspecified   . Orthostatic hypotension   . Labile blood pressure   . Acute blood loss anemia   . Diabetes mellitus type 2 in nonobese (HCC)   . Labile blood glucose   . CKD (chronic kidney disease) 05/11/2017  . Infarction of left basal ganglia ([place]) 05/11/2017  . Idiopathic hypotension 05/09/2017  . Stroke ([place]) 05/06/2017  . Stroke (cerebrum) ([place]) 05/06/2017  . Acute ischemic left MCA stroke ([place]) 05/06/2017  . Cancer of upper lobe of left lung ([place]) 10/30/2016  . Pure hypercholesterolemia 06/22/2014  . GERD without esophagitis 06/22/2014  . Benign essential hypertension 06/22/2014  . Diabetes mellitus without complication ([place]) 17/71/1657   Past Medical History:  Past Medical History:  Diagnosis Date  . Cancer ([place])   . COPD (chronic obstructive pulmonary disease) ([place])   . Diabetes mellitus without complication ([place])   . GERD without esophagitis   . Hepatitis C virus    history of hep C treated with Harvoni  . Hypercholesterolemia   . Hypertension   . Seasonal allergies    Past Surgical History:  Past Surgical History:  Procedure Laterality Date  . COLONOSCOPY     approximately 11 years ago  . FLEXIBLE BRONCHOSCOPY N/A 11/05/2016   Procedure: FLEXIBLE BRONCHOSCOPY;  Surgeon: Wilhelmina Mcardle, MD;  Location: [facility]S;  Service: Pulmonary;   Laterality: N/A;  . IR FLUORO GUIDE CV LINE RIGHT  05/06/2017  . IR INTRAVSC STENT CERV CAROTID W/O EMB-PROT MOD SED INC ANGIO  05/06/2017  . IR PERCUTANEOUS ART THROMBECTOMY/INFUSION INTRACRANIAL INC DIAG ANGIO  05/06/2017  . IR US GUIDE VASC ACCESS LEFT  05/06/2017  . IR US GUIDE VASC ACCESS RIGHT  05/06/2017  . IR US GUIDE VASC ACCESS RIGHT  05/06/2017  . PORTA CATH INSERTION N/A 11/23/2016   Procedure: Glori Luis Cath Insertion;  Surgeon: Algernon Huxley, MD;  Location: [place] [facility];  Service: Cardiovascular;  Laterality: N/A;  . RADIOLOGY WITH ANESTHESIA N/A 05/06/2017   Procedure: IR WITH ANESTHESIA;  Surgeon: Radiologist, Medication, MD;  Location: [place];  Service: Radiology;  Laterality: N/A;  . TEE WITHOUT CARDIOVERSION N/A 05/10/2017   Procedure: TRANSESOPHAGEAL ECHOCARDIOGRAM (TEE);  Surgeon: Sanda Klein, MD;  Location: [facility] ENDOSCOPY;  Service: Cardiovascular;  Laterality: N/A;  . TONSILLECTOMY     Social History:  reports that he quit smoking about 13 years ago. His smoking use included cigarettes. He has a 50.00 pack-year smoking history. he has never used smokeless tobacco. He reports that he does not drink alcohol or use drugs.  Family / Support Systems    Social History Preferred language: English Religion: Baptist     Abuse/Neglect Abuse/Neglect Assessment Can Be Completed: Yes Physical Abuse: Denies Verbal Abuse: Denies Sexual Abuse: Denies Exploitation of patient/patient's resources: Denies Self-Neglect: Denies  Emotional Status  Patient / Family Perceptions, Expectations & Goals    Community Resources    Discharge Planning Living Arrangements: Spouse/significant other Support Systems: Spouse/significant other Type of Residence: Private residence Does the patient have any problems obtaining your medications?: No  Clinical Impression   Rada Zegers, [name], LCSW  Social Worker  Physical Medicine and Rehabilitation  Progress Notes  Signed  Date of  Service:  05/12/2017  2:55 PM       Related encounter: Admission (Discharged) from 05/11/2017 in [place]          _0 Hide copied text  _1 Hover for details   Social Work  Social Work Assessment and Plan   Patient Details  Name: Edwin Jones MRN: [id number] Date of Birth: 1940-05-26   Today's Date: 05/12/2017   Problem List:      Patient Active Problem List    Diagnosis Date Noted  . CKD (chronic kidney disease) 05/11/2017  . Infarction of left basal ganglia ([place]) 05/11/2017  . Idiopathic hypotension 05/09/2017  . Stroke ([place]) 05/06/2017  . Stroke (cerebrum) ([place]) 05/06/2017  . Acute ischemic left MCA stroke ([place]) 05/06/2017  . Cancer of upper lobe of left lung ([place]) 10/30/2016  . Pure hypercholesterolemia 06/22/2014  . GERD without esophagitis 06/22/2014  . Benign essential hypertension 06/22/2014  . Diabetes mellitus without complication ([place]) 93/26/7124    Past Medical History:      Past Medical History:  Diagnosis Date  . Cancer ([place])    . COPD (chronic obstructive pulmonary disease) ([place])    . Diabetes mellitus without complication ([place])    . GERD without esophagitis    . Hepatitis C virus      history of hep C treated with Harvoni  . Hypercholesterolemia    . Hypertension    . Seasonal allergies      Past Surgical History:       Past Surgical History:  Procedure Laterality Date  . COLONOSCOPY        approximately 11 years ago  . FLEXIBLE BRONCHOSCOPY N/A 11/05/2016    Procedure: FLEXIBLE BRONCHOSCOPY;  Surgeon: Wilhelmina Mcardle, MD;  Location: [facility]S;  Service: Pulmonary;  Laterality: N/A;  . IR FLUORO GUIDE CV LINE RIGHT   05/06/2017  . IR INTRAVSC STENT CERV CAROTID W/O EMB-PROT MOD SED INC ANGIO   05/06/2017  . IR PERCUTANEOUS ART THROMBECTOMY/INFUSION INTRACRANIAL INC DIAG ANGIO   05/06/2017  . IR US GUIDE VASC ACCESS LEFT   05/06/2017  . IR US GUIDE VASC ACCESS RIGHT   05/06/2017  . IR US GUIDE VASC  ACCESS RIGHT   05/06/2017  . PORTA CATH INSERTION N/A 11/23/2016    Procedure: Glori Luis Cath Insertion;  Surgeon: Algernon Huxley, MD;  Location: [place] [facility];  Service: Cardiovascular;  Laterality: N/A;  . RADIOLOGY WITH ANESTHESIA N/A 05/06/2017    Procedure: IR WITH ANESTHESIA;  Surgeon: Radiologist, Medication, MD;  Location: [place];  Service: Radiology;  Laterality: N/A;  . TEE WITHOUT CARDIOVERSION N/A 05/10/2017    Procedure: TRANSESOPHAGEAL ECHOCARDIOGRAM (TEE);  Surgeon: Sanda Klein, MD;  Location: [facility] ENDOSCOPY;  Service: Cardiovascular;  Laterality: N/A;  . TONSILLECTOMY        Social History:  reports that he quit smoking about 13 years ago. His smoking use included cigarettes. He has a 50.00 pack-year smoking history. he has never used smokeless tobacco. He reports that he does not drink alcohol or use drugs.  Family / Support Systems Marital Status: Married Patient Roles: Spouse, Parent, Other (Comment)(church deacon) Spouse/Significant Other: [name] [phone] Children: [name]-son  Other Supports: grandchildren are close and local Anticipated Caregiver: Wife Ability/Limitations of Caregiver: Wife has health issues and needs to be careful-heart issues and SOB at times Caregiver Availability: 24/7 Family Dynamics: Close knit family who are there for one another, they have a good church family who visit and send their prayers. Pt is one who is very independent and wants to take care of himself, he was even with chemo and rad treatments.   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    Abuse/Neglect Abuse/Neglect Assessment Can  Be Completed: Yes Physical Abuse: Denies Verbal Abuse: Denies Sexual Abuse: Denies Exploitation of patient/patient's resources: Denies Self-Neglect: Denies   Emotional Status Pt's affect, behavior adn adjustment status: Pt is motivated to do well and will do anything the therapy team asks of him to make improvements. His wife is here and trying to help but needs to let the therapists do their evaluations. She is willing to help while here just like being at home Recent Psychosocial Issues: other health issues had just finished his rad and chemo treatments for his lung cancer prior to coming into the hospital Pyschiatric History: No history deferred depression screen due to doing well and still adjusting to the new unit and program. He is coping appropriately and is talking about his feelings. Will monitor and have neruo-psych see while here if would be beneficial Substance Abuse History: No issues-quit tobacco years ago   Patient / Family Perceptions, Expectations & Goals Pt/Family understanding of illness & functional limitations: Pt and wife can explain his stroke and deficits. They both have spoken with the MD and feel thier questions and concerns have been addressed. Wife plans to be here daily and ask the MD her questions that come up. She is not one to shy away from finding out the answers she seeks. 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, father, grandfather, retiree, church deacon, friend, etc Anticipated changes in roles/activities/participation: resume Pt/family expectations/goals: Pt states: " I want to be able to take care of myself before I leave here."  Wife states: " I hope he can get back to what he was doing before this, I need him too."   [employer]: Other (Comment)(Cancer [facility]) Premorbid Home Care/DME Agencies: None Transportation available at discharge: Wife Resource referrals recommended: Support group (specify)   Discharge  Planning Living Arrangements: Spouse/significant other Support Systems: Spouse/significant other, Children, Other relatives, Friends/neighbors, Social worker community Type of Residence: Private residence Insurance Resources: [employer] Financial Resources: Radio broadcast assistant Screen Referred: No Living Expenses: Education officer, community Management: Patient, Spouse Does the patient have any problems obtaining your medications?: No Home Management: Wife Patient/Family Preliminary Plans: Return home with wife who is willing to assist and maybe do too much for him at the expense of her own health issues. Glad she will be here daily and see him in therpaies so she will see what he can do for himself and what he will need assist with. Will await therapy team evaluations and work on discharge needs. Social Work Anticipated Follow Up Needs: Support Group, HH/OP   Clinical Impression Pleasant gentleman who is willing  to push himself in therapies to achieve his goals of mod/i-supervision level. His wife is a strong support and tries to do too much for him, but feels she needs to do something while here. Will await therapy evaluations and work on a safe plan for him.WIll monitor to see if neuro-psych needed.    Elease Hashimoto 05/12/2017, 2:55 PM               Mac Dowdell, [name], LCSW  Social Worker  Physical Medicine and Rehabilitation  Progress Notes  Signed  Date of Service:  05/12/2017  2:55 PM       Related encounter: Admission (Discharged) from 05/11/2017 in [place]          _0 Hide copied text  _1 Hover for details   Social Work  Social Work Assessment and Plan   Patient Details  Name: Edwin Jones MRN: [id number] Date of Birth: 10-12-1940   Today's Date: 05/12/2017   Problem List:      Patient Active Problem List    Diagnosis Date Noted  . CKD (chronic kidney disease) 05/11/2017  . Infarction of left basal ganglia ([place])  05/11/2017  . Idiopathic hypotension 05/09/2017  . Stroke ([place]) 05/06/2017  . Stroke (cerebrum) ([place]) 05/06/2017  . Acute ischemic left MCA stroke ([place]) 05/06/2017  . Cancer of upper lobe of left lung ([place]) 10/30/2016  . Pure hypercholesterolemia 06/22/2014  . GERD without esophagitis 06/22/2014  . Benign essential hypertension 06/22/2014  . Diabetes mellitus without complication ([place]) 08/65/7846    Past Medical History:      Past Medical History:  Diagnosis Date  . Cancer ([place])    . COPD (chronic obstructive pulmonary disease) ([place])    . Diabetes mellitus without complication ([place])    . GERD without esophagitis    . Hepatitis C virus      history of hep C treated with Harvoni  . Hypercholesterolemia    . Hypertension    . Seasonal allergies      Past Surgical History:       Past Surgical History:  Procedure Laterality Date  . COLONOSCOPY        approximately 11 years ago  . FLEXIBLE BRONCHOSCOPY N/A 11/05/2016    Procedure: FLEXIBLE BRONCHOSCOPY;  Surgeon: Wilhelmina Mcardle, MD;  Location: [facility] ORS;  Service: Pulmonary;  Laterality: N/A;  . IR FLUORO GUIDE CV LINE RIGHT   05/06/2017  . IR INTRAVSC STENT CERV CAROTID W/O EMB-PROT MOD SED INC ANGIO   05/06/2017  . IR PERCUTANEOUS ART THROMBECTOMY/INFUSION INTRACRANIAL INC DIAG ANGIO   05/06/2017  . IR US GUIDE VASC ACCESS LEFT   05/06/2017  . IR US GUIDE VASC ACCESS RIGHT   05/06/2017  . IR US GUIDE VASC ACCESS RIGHT   05/06/2017  . PORTA CATH INSERTION N/A 11/23/2016    Procedure: Glori Luis Cath Insertion;  Surgeon: Algernon Huxley, MD;  Location: [place] [facility];  Service: Cardiovascular;  Laterality: N/A;  . RADIOLOGY WITH ANESTHESIA N/A 05/06/2017    Procedure: IR WITH ANESTHESIA;  Surgeon: Radiologist, Medication, MD;  Location: [place];  Service: Radiology;  Laterality: N/A;  . TEE WITHOUT CARDIOVERSION N/A 05/10/2017    Procedure: TRANSESOPHAGEAL ECHOCARDIOGRAM (TEE);  Surgeon: Sanda Klein, MD;  Location: [facility] ENDOSCOPY;  Service:  Cardiovascular;  Laterality: N/A;  . TONSILLECTOMY        Social History:  reports that he quit smoking about 13 years ago.  His smoking use included cigarettes. He has a 50.00 pack-year smoking history. he has never used smokeless tobacco. He reports that he does not drink alcohol or use drugs.   Family / Support Systems Marital Status: Married Patient Roles: Spouse, Parent, Other (Comment)(church deacon) Spouse/Significant Other: [name] [phone] Children: [name]-son  Other Supports: grandchildren are close and local Anticipated Caregiver: Wife Ability/Limitations of Caregiver: Wife has health issues and needs to be careful-heart issues and SOB at times Caregiver Availability: 24/7 Family Dynamics: Close knit family who are there for one another, they have a good church family who visit and send their prayers. Pt is one who is very independent and wants to take care of himself, he was even with chemo and rad treatments.   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    Abuse/Neglect Abuse/Neglect Assessment Can Be Completed: Yes Physical Abuse: Denies Verbal Abuse: Denies Sexual Abuse: Denies Exploitation of patient/patient's resources: Denies Self-Neglect: Denies   Emotional Status Pt's affect, behavior adn adjustment status: Pt is motivated to do well and will do anything the therapy team asks of him to make improvements. His wife is here and trying to help but needs to let the therapists do their evaluations. She is willing to help while here just like being at home Recent Psychosocial Issues: other health issues had just finished  his rad and chemo treatments for his lung cancer prior to coming into the hospital Pyschiatric History: No history deferred depression screen due to doing well and still adjusting to the new unit and program. He is coping appropriately and is talking about his feelings. Will monitor and have neruo-psych see while here if would be beneficial Substance Abuse History: No issues-quit tobacco years ago   Patient / Family Perceptions, Expectations & Goals Pt/Family understanding of illness & functional limitations: Pt and wife can explain his stroke and deficits. They both have spoken with the MD and feel thier questions and concerns have been addressed. Wife plans to be here daily and ask the MD her questions that come up. She is not one to shy away from finding out the answers she seeks. 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, father, grandfather, retiree, church deacon, friend, etc Anticipated changes in roles/activities/participation: resume Pt/family expectations/goals: Pt states: " I want to be able to take care of myself before I leave here."  Wife states: " I hope he can get back to what he was doing before this, I need him too."   [employer]: Other (Comment)(Cancer [facility]) Premorbid Home Care/DME Agencies: None Transportation available at discharge: Wife Resource referrals recommended: Support group (specify)   Discharge Planning Living Arrangements: Spouse/significant other Support Systems: Spouse/significant other, Children, Other relatives, Friends/neighbors, Social worker community Type of Residence: Private residence Insurance Resources: [employer] Financial Resources: Radio broadcast assistant Screen Referred: No Living Expenses: Education officer, community Management: Patient, Spouse Does the patient have any problems obtaining your medications?: No Home Management: Wife Patient/Family Preliminary Plans: Return home with wife who is willing to assist and maybe  do too much for him at the expense of her own health issues. Glad she will be here daily and see him in therpaies so she will see what he can do for himself and what he will  need assist with. Will await therapy team evaluations and work on discharge needs. Social Work Anticipated Follow Up Needs: Support Group, HH/OP   Clinical Impression Pleasant gentleman who is willing to push himself in therapies to achieve his goals of mod/i-supervision level. His wife is a strong support and tries to do too much for him, but feels she needs to do something while here. Will await therapy evaluations and work on a safe plan for him.WIll monitor to see if neuro-psych needed.    Elease Hashimoto 05/12/2017, 2:55 PM               Mansa Willers, [name], LCSW  Social Worker  Physical Medicine and Rehabilitation  Progress Notes  Signed  Date of Service:  05/12/2017  2:55 PM       Related encounter: Admission (Discharged) from 05/11/2017 in [place]          _0 Hide copied text  _1 Hover for details   Social Work  Social Work Assessment and Plan   Patient Details  Name: Edwin Jones MRN: [id number] Date of Birth: April 23, 1940   Today's Date: 05/12/2017   Problem List:      Patient Active Problem List    Diagnosis Date Noted  . CKD (chronic kidney disease) 05/11/2017  . Infarction of left basal ganglia ([place]) 05/11/2017  . Idiopathic hypotension 05/09/2017  . Stroke ([place]) 05/06/2017  . Stroke (cerebrum) ([place]) 05/06/2017  . Acute ischemic left MCA stroke ([place]) 05/06/2017  . Cancer of upper lobe of left lung ([place]) 10/30/2016  . Pure hypercholesterolemia 06/22/2014  . GERD without esophagitis 06/22/2014  . Benign essential hypertension 06/22/2014  . Diabetes mellitus without complication ([place]) 40/98/1191    Past Medical History:      Past Medical History:  Diagnosis Date  . Cancer ([place])    . COPD (chronic obstructive pulmonary disease)  ([place])    . Diabetes mellitus without complication ([place])    . GERD without esophagitis    . Hepatitis C virus      history of hep C treated with Harvoni  . Hypercholesterolemia    . Hypertension    . Seasonal allergies      Past Surgical History:       Past Surgical History:  Procedure Laterality Date  . COLONOSCOPY        approximately 11 years ago  . FLEXIBLE BRONCHOSCOPY N/A 11/05/2016    Procedure: FLEXIBLE BRONCHOSCOPY;  Surgeon: Wilhelmina Mcardle, MD;  Location: [facility] ORS;  Service: Pulmonary;  Laterality: N/A;  . IR FLUORO GUIDE CV LINE RIGHT   05/06/2017  . IR INTRAVSC STENT CERV CAROTID W/O EMB-PROT MOD SED INC ANGIO   05/06/2017  . IR PERCUTANEOUS ART THROMBECTOMY/INFUSION INTRACRANIAL INC DIAG ANGIO   05/06/2017  . IR US GUIDE VASC ACCESS LEFT   05/06/2017  . IR US GUIDE VASC ACCESS RIGHT   05/06/2017  . IR US GUIDE VASC ACCESS RIGHT   05/06/2017  . PORTA CATH INSERTION N/A 11/23/2016    Procedure: Glori Luis Cath Insertion;  Surgeon: Algernon Huxley, MD;  Location: [place] [facility];  Service: Cardiovascular;  Laterality: N/A;  . RADIOLOGY WITH ANESTHESIA N/A 05/06/2017    Procedure: IR WITH ANESTHESIA;  Surgeon: Radiologist, Medication, MD;  Location: [place];  Service: Radiology;  Laterality: N/A;  . TEE WITHOUT CARDIOVERSION N/A 05/10/2017    Procedure: TRANSESOPHAGEAL ECHOCARDIOGRAM (TEE);  Surgeon: Sanda Klein, MD;  [facility] ENDOSCOPY;  Service: Cardiovascular;  Laterality: N/A;  . TONSILLECTOMY        Social History:  reports that he quit smoking about 13 years ago. His smoking use included cigarettes. He has a 50.00 pack-year smoking history. he has never used smokeless tobacco. He reports that he does not drink alcohol or use drugs.   Family / Support Systems Marital Status: Married Patient Roles: Spouse, Parent, Other (Comment)(church deacon) Spouse/Significant Other: [name] [phone] Children: [name]-son  Other Supports: grandchildren are close  and local Anticipated Caregiver: Wife Ability/Limitations of Caregiver: Wife has health issues and needs to be careful-heart issues and SOB at times Caregiver Availability: 24/7 Family Dynamics: Close knit family who are there for one another, they have a good church family who visit and send their prayers. Pt is one who is very independent and wants to take care of himself, he was even with chemo and rad treatments.   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    Abuse/Neglect Abuse/Neglect Assessment Can Be Completed: Yes Physical Abuse: Denies Verbal Abuse: Denies Sexual Abuse: Denies Exploitation of patient/patient's resources: Denies Self-Neglect: Denies   Emotional Status Pt's affect, behavior adn adjustment status: Pt is motivated to do well and will do anything the therapy team asks of him to make improvements. His wife is here and trying to help but needs to let the therapists do their evaluations. She is willing to help while here just like being at home Recent Psychosocial Issues: other health issues had just finished his rad and chemo treatments for his lung cancer prior to coming into the hospital Pyschiatric History: No history deferred depression screen due to doing well and still adjusting to the new unit and program. He is coping appropriately and is talking about his feelings. Will monitor and have neruo-psych see while here if would be beneficial Substance Abuse History: No issues-quit tobacco years ago   Patient / Family Perceptions, Expectations & Goals Pt/Family understanding of illness & functional limitations: Pt and wife can explain his  stroke and deficits. They both have spoken with the MD and feel thier questions and concerns have been addressed. Wife plans to be here daily and ask the MD her questions that come up. She is not one to shy away from finding out the answers she seeks. 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, father, grandfather, retiree, church deacon, friend, etc Anticipated changes in roles/activities/participation: resume Pt/family expectations/goals: Pt states: " I want to be able to take care of myself before I leave here."  Wife states: " I hope he can get back to what he was doing before this, I need him too."   [employer]: Other (Comment)(Cancer [facility]) Premorbid Home Care/DME Agencies: None Transportation available at discharge: Wife Resource referrals recommended: Support group (specify)   Discharge Planning Living Arrangements: Spouse/significant other Support Systems: Spouse/significant other, Children, Other relatives, Friends/neighbors, Church/faith community Type of Residence: Private residence Insurance Resources: [employer] Financial Resources: Radio broadcast assistant Screen Referred: No Living Expenses: Rent Money Management: Patient, Spouse Does the patient have any problems obtaining your medications?: No Home Management: Wife Patient/Family Preliminary Plans: Return home with wife who is willing to assist and maybe do too much for him at the expense  of her own health issues. Glad she will be here daily and see him in therpaies so she will see what he can do for himself and what he will need assist with. Will await therapy team evaluations and work on discharge needs. Social Work Anticipated Follow Up Needs: Support Group, HH/OP   Clinical Impression Pleasant gentleman who is willing to push himself in therapies to achieve his goals of mod/i-supervision level. His wife is a strong support and tries to do too much for him, but feels she needs to do  something while here. Will await therapy evaluations and work on a safe plan for him.WIll monitor to see if neuro-psych needed.    Elease Hashimoto 05/12/2017, 2:55 PM               Rhapsody Wolven, [name], LCSW  Social Worker  Physical Medicine and Rehabilitation  Progress Notes  Signed  Date of Service:  05/12/2017  2:55 PM       Related encounter: Admission (Discharged) from 05/11/2017 in [place]          _0 Hide copied text  _1 Hover for details   Social Work  Social Work Assessment and Plan   Patient Details  Name: Edwin Jones MRN: [id number] Date of Birth: Feb 04, 1941   Today's Date: 05/12/2017   Problem List:      Patient Active Problem List    Diagnosis Date Noted  . CKD (chronic kidney disease) 05/11/2017  . Infarction of left basal ganglia ([place]) 05/11/2017  . Idiopathic hypotension 05/09/2017  . Stroke ([place]) 05/06/2017  . Stroke (cerebrum) ([place]) 05/06/2017  . Acute ischemic left MCA stroke ([place]) 05/06/2017  . Cancer of upper lobe of left lung ([place]) 10/30/2016  . Pure hypercholesterolemia 06/22/2014  . GERD without esophagitis 06/22/2014  . Benign essential hypertension 06/22/2014  . Diabetes mellitus without complication ([place]) 01/60/1093    Past Medical History:      Past Medical History:  Diagnosis Date  . Cancer ([place])    . COPD (chronic obstructive pulmonary disease) ([place])    . Diabetes mellitus without complication ([place])    . GERD without esophagitis    . Hepatitis C virus      history of hep C treated with Harvoni  . Hypercholesterolemia    . Hypertension    . Seasonal allergies      Past Surgical History:       Past Surgical History:  Procedure Laterality Date  . COLONOSCOPY        approximately 11 years ago  . FLEXIBLE BRONCHOSCOPY N/A 11/05/2016    Procedure: FLEXIBLE BRONCHOSCOPY;  Surgeon: Wilhelmina Mcardle, MD;  Location: [facility] ORS;  Service: Pulmonary;  Laterality: N/A;  . IR FLUORO  GUIDE CV LINE RIGHT   05/06/2017  . IR INTRAVSC STENT CERV CAROTID W/O EMB-PROT MOD SED INC ANGIO   05/06/2017  . IR PERCUTANEOUS ART THROMBECTOMY/INFUSION INTRACRANIAL INC DIAG ANGIO   05/06/2017  . IR US GUIDE VASC ACCESS LEFT   05/06/2017  . IR US GUIDE VASC ACCESS RIGHT   05/06/2017  . IR US GUIDE VASC ACCESS RIGHT   05/06/2017  . PORTA CATH INSERTION N/A 11/23/2016    Procedure: Glori Luis Cath Insertion;  Surgeon: Algernon Huxley, MD;  Location: [place] [facility];  Service: Cardiovascular;  Laterality: N/A;  . RADIOLOGY WITH ANESTHESIA N/A 05/06/2017    Procedure: IR WITH ANESTHESIA;  Surgeon: Radiologist, Medication, MD;  Location: [place];  Service: Radiology;  Laterality: N/A;  . TEE WITHOUT CARDIOVERSION N/A 05/10/2017    Procedure: TRANSESOPHAGEAL ECHOCARDIOGRAM (TEE);  Surgeon: Sanda Klein, MD;  Location: [facility]COPY;  Service: Cardiovascular;  Laterality: N/A;  . TONSILLECTOMY        Social History:  reports that he quit smoking about 13 years ago. His smoking use included cigarettes. He has a 50.00 pack-year smoking history. he has never used smokeless tobacco. He reports that he does not drink alcohol or use drugs.   Family / Support Systems Marital Status: Married Patient Roles: Spouse, Parent, Other (Comment)(church deacon) Spouse/Significant Other: [name] [phone] Children: [name]-son  Other Supports: grandchildren are close and local Anticipated Caregiver: Wife Ability/Limitations of Caregiver: Wife has health issues and needs to be careful-heart issues and SOB at times Caregiver Availability: 24/7 Family Dynamics: Close knit family who are there for one another, they have a good church family who visit and send their prayers. Pt is one who is very independent and wants to take care of himself, he was even with chemo and rad treatments.   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    Abuse/Neglect Abuse/Neglect Assessment Can Be Completed: Yes Physical Abuse: Denies Verbal Abuse: Denies Sexual Abuse: Denies Exploitation of patient/patient's resources: Denies Self-Neglect: Denies   Emotional Status Pt's affect, behavior adn adjustment status: Pt is motivated to do well and will do anything the therapy team asks of him to make improvements. His wife is here and trying to help but needs to let the therapists do their evaluations. She is willing to help while here just like being at home Recent Psychosocial Issues: other health issues had just finished his rad and chemo treatments for his lung cancer prior to coming into the hospital Pyschiatric History: No history deferred depression screen due to doing well and still adjusting to the new unit and program. He is coping appropriately and is talking about his feelings. Will monitor and have neruo-psych see while here if would be beneficial Substance Abuse History: No issues-quit tobacco years ago   Patient / Family Perceptions, Expectations & Goals Pt/Family understanding of illness & functional limitations: Pt and wife can explain his stroke and deficits. They both have spoken with the MD and feel thier questions and concerns have been addressed. Wife plans to be here daily and ask the MD her questions that come up. She is not one to shy away from finding out the answers she seeks. 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, father, grandfather, retiree, church deacon, friend, etc Anticipated changes in roles/activities/participation: resume Pt/family expectations/goals: Pt states: " I want to be able to take care of myself before I leave here."  Wife states: " I  hope he can get back to what he was doing before this, I need him too."   [employer]: Other (Comment)(Cancer [facility]) Premorbid Home Care/DME Agencies: None Transportation available at discharge: Wife Resource referrals recommended: Support group (specify)   Discharge Planning Living Arrangements: Spouse/significant other Support Systems: Spouse/significant other, Children, Other relatives, Friends/neighbors, Church/faith community Type of Residence: Private residence Insurance Resources: [employer] Financial Resources: [place] Referred: No Living Expenses: Rent Money Management: Patient, Spouse Does the patient have any  problems obtaining your medications?: No Home Management: Wife Patient/Family Preliminary Plans: Return home with wife who is willing to assist and maybe do too much for him at the expense of her own health issues. Glad she will be here daily and see him in therpaies so she will see what he can do for himself and what he will need assist with. Will await therapy team evaluations and work on discharge needs. Social Work Anticipated Follow Up Needs: Support Group, HH/OP   Clinical Impression Pleasant gentleman who is willing to push himself in therapies to achieve his goals of mod/i-supervision level. His wife is a strong support and tries to do too much for him, but feels she needs to do something while here. Will await therapy evaluations and work on a safe plan for him.WIll monitor to see if neuro-psych needed.    Elease Hashimoto 05/12/2017, 2:55 PM               Usiel Astarita, [name], LCSW  Social Worker  Physical Medicine and Rehabilitation  Progress Notes  Signed  Date of Service:  05/12/2017  2:55 PM       Related encounter: Admission (Discharged) from 05/11/2017 in [place]          _0 Hide copied text  _1 Hover for details   Social Work   Social Work Assessment and Plan   Patient Details  Name: Edwin Jones MRN: [id number] Date of Birth: January 22, 1941   Today's Date: 05/12/2017   Problem List:      Patient Active Problem List    Diagnosis Date Noted  . CKD (chronic kidney disease) 05/11/2017  . Infarction of left basal ganglia ([place]) 05/11/2017  . Idiopathic hypotension 05/09/2017  . Stroke ([place]) 05/06/2017  . Stroke (cerebrum) ([place]) 05/06/2017  . Acute ischemic left MCA stroke ([place]) 05/06/2017  . Cancer of upper lobe of left lung ([place]) 10/30/2016  . Pure hypercholesterolemia 06/22/2014  . GERD without esophagitis 06/22/2014  . Benign essential hypertension 06/22/2014  . Diabetes mellitus without complication ([place]) 31/51/7616    Past Medical History:      Past Medical History:  Diagnosis Date  . Cancer ([place])    . COPD (chronic obstructive pulmonary disease) ([place])    . Diabetes mellitus without complication ([place])    . GERD without esophagitis    . Hepatitis C virus      history of hep C treated with Harvoni  . Hypercholesterolemia    . Hypertension    . Seasonal allergies      Past Surgical History:       Past Surgical History:  Procedure Laterality Date  . COLONOSCOPY        approximately 11 years ago  . FLEXIBLE BRONCHOSCOPY N/A 11/05/2016    Procedure: FLEXIBLE BRONCHOSCOPY;  Surgeon: Wilhelmina Mcardle, MD;  Location: [facility] ORS;  Service: Pulmonary;  Laterality: N/A;  . IR FLUORO GUIDE CV LINE RIGHT   05/06/2017  . IR INTRAVSC STENT CERV CAROTID W/O EMB-PROT MOD SED INC ANGIO   05/06/2017  . IR PERCUTANEOUS ART THROMBECTOMY/INFUSION INTRACRANIAL INC DIAG ANGIO   05/06/2017  . IR US GUIDE VASC ACCESS LEFT   05/06/2017  . IR US GUIDE VASC ACCESS RIGHT   05/06/2017  . IR US GUIDE VASC ACCESS RIGHT   05/06/2017  . PORTA CATH INSERTION N/A 11/23/2016    Procedure: Glori Luis Cath Insertion;  Surgeon: Algernon Huxley, MD;  Location:  [place] CV LAB;  Service: Cardiovascular;  Laterality: N/A;  . RADIOLOGY WITH  ANESTHESIA N/A 05/06/2017    Procedure: IR WITH ANESTHESIA;  Surgeon: Radiologist, Medication, MD;  Location: [place];  Service: Radiology;  Laterality: N/A;  . TEE WITHOUT CARDIOVERSION N/A 05/10/2017    Procedure: TRANSESOPHAGEAL ECHOCARDIOGRAM (TEE);  Surgeon: Sanda Klein, MD;  Location: [facility] ENDOSCOPY;  Service: Cardiovascular;  Laterality: N/A;  . TONSILLECTOMY        Social History:  reports that he quit smoking about 13 years ago. His smoking use included cigarettes. He has a 50.00 pack-year smoking history. he has never used smokeless tobacco. He reports that he does not drink alcohol or use drugs.   Family / Support Systems Marital Status: Married Patient Roles: Spouse, Parent, Other (Comment)(church deacon) Spouse/Significant Other: [name] [phone] Children: [name]-son  Other Supports: grandchildren are close and local Anticipated Caregiver: Wife Ability/Limitations of Caregiver: Wife has health issues and needs to be careful-heart issues and SOB at times Caregiver Availability: 24/7 Family Dynamics: Close knit family who are there for one another, they have a good church family who visit and send their prayers. Pt is one who is very independent and wants to take care of himself, he was even with chemo and rad treatments.   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    Abuse/Neglect Abuse/Neglect Assessment Can Be Completed: Yes Physical Abuse: Denies Verbal Abuse: Denies Sexual Abuse: Denies Exploitation of patient/patient's resources: Denies Self-Neglect: Denies   Emotional Status Pt's affect, behavior adn adjustment  status: Pt is motivated to do well and will do anything the therapy team asks of him to make improvements. His wife is here and trying to help but needs to let the therapists do their evaluations. She is willing to help while here just like being at home Recent Psychosocial Issues: other health issues had just finished his rad and chemo treatments for his lung cancer prior to coming into the hospital Pyschiatric History: No history deferred depression screen due to doing well and still adjusting to the new unit and program. He is coping appropriately and is talking about his feelings. Will monitor and have neruo-psych see while here if would be beneficial Substance Abuse History: No issues-quit tobacco years ago   Patient / Family Perceptions, Expectations & Goals Pt/Family understanding of illness & functional limitations: Pt and wife can explain his stroke and deficits. They both have spoken with the MD and feel thier questions and concerns have been addressed. Wife plans to be here daily and ask the MD her questions that come up. She is not one to shy away from finding out the answers she seeks. 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, father, grandfather, retiree, church deacon, friend, etc Anticipated changes in roles/activities/participation: resume Pt/family expectations/goals: Pt states: " I want to be able to take care of myself before I leave here."  Wife states: " I hope he can get back to what he was doing before this, I need him too."   [employer]: Other (Comment)(Cancer [facility]) Premorbid Home Care/DME Agencies: None Transportation available at discharge: Wife Resource referrals recommended: Support group (specify)   Discharge Planning Living Arrangements: Spouse/significant other Support Systems: Spouse/significant other, Children, Other relatives, Friends/neighbors,  Church/faith community Type of Residence: Private residence Insurance  Resources: [employer] Financial Resources: Radio broadcast assistant Screen Referred: No Living Expenses: Rent Money Management: Patient, Spouse Does the patient have any problems obtaining your medications?: No Home Management: Wife Patient/Family Preliminary Plans: Return home with wife who is willing to assist and maybe do too much for him at the expense of her own health issues. Glad she will be here daily and see him in therpaies so she will see what he can do for himself and what he will need assist with. Will await therapy team evaluations and work on discharge needs. Social Work Anticipated Follow Up Needs: Support Group, HH/OP   Clinical Impression Pleasant gentleman who is willing to push himself in therapies to achieve his goals of mod/i-supervision level. His wife is a strong support and tries to do too much for him, but feels she needs to do something while here. Will await therapy evaluations and work on a safe plan for him.WIll monitor to see if neuro-psych needed.    Elease Hashimoto 05/12/2017, 2:55 PM               Reegan Mctighe, [name], LCSW  Social Worker  Physical Medicine and Rehabilitation  Progress Notes  Signed  Date of Service:  05/12/2017  2:55 PM       Related encounter: Admission (Discharged) from 05/11/2017 in [place]          _0 Hide copied text  _1 Hover for details   Social Work  Social Work Assessment and Plan   Patient Details  Name: Edwin Jones MRN: [id number] Date of Birth: 09-08-1940   Today's Date: 05/12/2017   Problem List:      Patient Active Problem List    Diagnosis Date Noted  . CKD (chronic kidney disease) 05/11/2017  . Infarction of left basal ganglia ([place]) 05/11/2017  . Idiopathic hypotension 05/09/2017  . Stroke ([place]) 05/06/2017  . Stroke (cerebrum) ([place]) 05/06/2017  . Acute ischemic left MCA stroke ([place]) 05/06/2017  . Cancer of upper lobe of left lung ([place]) 10/30/2016   . Pure hypercholesterolemia 06/22/2014  . GERD without esophagitis 06/22/2014  . Benign essential hypertension 06/22/2014  . Diabetes mellitus without complication ([place]) 82/99/3716    Past Medical History:      Past Medical History:  Diagnosis Date  . Cancer ([place])    . COPD (chronic obstructive pulmonary disease) ([place])    . Diabetes mellitus without complication ([place])    . GERD without esophagitis    . Hepatitis C virus      history of hep C treated with Harvoni  . Hypercholesterolemia    . Hypertension    . Seasonal allergies      Past Surgical History:       Past Surgical History:  Procedure Laterality Date  . COLONOSCOPY        approximately 11 years ago  . FLEXIBLE BRONCHOSCOPY N/A 11/05/2016    Procedure: FLEXIBLE BRONCHOSCOPY;  Surgeon: Wilhelmina Mcardle, MD;  Location: [facility] ORS;  Service: Pulmonary;  Laterality: N/A;  . IR FLUORO GUIDE CV LINE RIGHT   05/06/2017  . IR INTRAVSC STENT CERV CAROTID W/O EMB-PROT MOD SED INC ANGIO   05/06/2017  . IR PERCUTANEOUS ART THROMBECTOMY/INFUSION INTRACRANIAL INC DIAG ANGIO   05/06/2017  . IR US GUIDE VASC ACCESS LEFT   05/06/2017  . IR US GUIDE VASC ACCESS RIGHT   05/06/2017  .  IR US GUIDE VASC ACCESS RIGHT   05/06/2017  . PORTA CATH INSERTION N/A 11/23/2016    Procedure: Glori Luis Cath Insertion;  Surgeon: Algernon Huxley, MD;  Location: [place] CV LAB;  Service: Cardiovascular;  Laterality: N/A;  . RADIOLOGY WITH ANESTHESIA N/A 05/06/2017    Procedure: IR WITH ANESTHESIA;  Surgeon: Radiologist, Medication, MD;  Location: [facility];  Service: Radiology;  Laterality: N/A;  . TEE WITHOUT CARDIOVERSION N/A 05/10/2017    Procedure: TRANSESOPHAGEAL ECHOCARDIOGRAM (TEE);  Surgeon: Sanda Klein, MD;  Location: [facility] ENDOSCOPY;  Service: Cardiovascular;  Laterality: N/A;  . TONSILLECTOMY        Social History:  reports that he quit smoking about 13 years ago. His smoking use included cigarettes. He has a 50.00 pack-year smoking history. he has never  used smokeless tobacco. He reports that he does not drink alcohol or use drugs.   Family / Support Systems Marital Status: Married Patient Roles: Spouse, Parent, Other (Comment)(church deacon) Spouse/Significant Other: [name] [phone] Children: [name]-son  Other Supports: grandchildren are close and local Anticipated Caregiver: Wife Ability/Limitations of Caregiver: Wife has health issues and needs to be careful-heart issues and SOB at times Caregiver Availability: 24/7 Family Dynamics: Close knit family who are there for one another, they have a good church family who visit and send their prayers. Pt is one who is very independent and wants to take care of himself, he was even with chemo and rad treatments.   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    Abuse/Neglect Abuse/Neglect Assessment Can Be Completed: Yes Physical Abuse: Denies Verbal Abuse: Denies Sexual Abuse: Denies Exploitation of patient/patient's resources: Denies Self-Neglect: Denies   Emotional Status Pt's affect, behavior adn adjustment status: Pt is motivated to do well and will do anything the therapy team asks of him to make improvements. His wife is here and trying to help but needs to let the therapists do their evaluations. She is willing to help while here just like being at home Recent Psychosocial Issues: other health issues had just finished his rad and chemo treatments for his lung cancer prior to coming into the hospital Pyschiatric History: No history deferred depression screen due to doing well and still adjusting to the new unit and program. He is  coping appropriately and is talking about his feelings. Will monitor and have neruo-psych see while here if would be beneficial Substance Abuse History: No issues-quit tobacco years ago   Patient / Family Perceptions, Expectations & Goals Pt/Family understanding of illness & functional limitations: Pt and wife can explain his stroke and deficits. They both have spoken with the MD and feel thier questions and concerns have been addressed. Wife plans to be here daily and ask the MD her questions that come up. She is not one to shy away from finding out the answers she seeks. 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, father, grandfather, retiree, church deacon, friend, etc Anticipated changes in roles/activities/participation: resume Pt/family expectations/goals: Pt states: " I want to be able to take care of myself before I leave here."  Wife states: " I hope he can get back to what he was doing before this, I need him too."   [employer]: Other (Comment)(Cancer [facility]) Premorbid  Home Care/DME Agencies: None Transportation available at discharge: Wife Resource referrals recommended: Support group (specify)   Discharge Planning Living Arrangements: Spouse/significant other Support Systems: Spouse/significant other, Children, Other relatives, Friends/neighbors, Social worker community Type of Residence: Private residence Insurance Resources: [employer] Financial Resources: Radio broadcast assistant Screen Referred: No Living Expenses: Education officer, community Management: Patient, Spouse Does the patient have any problems obtaining your medications?: No Home Management: Wife Patient/Family Preliminary Plans: Return home with wife who is willing to assist and maybe do too much for him at the expense of her own health issues. Glad she will be here daily and see him in therpaies so she will see what he can do for himself and what he will need assist with. Will await therapy team  evaluations and work on discharge needs. Social Work Anticipated Follow Up Needs: Support Group, HH/OP   Clinical Impression Pleasant gentleman who is willing to push himself in therapies to achieve his goals of mod/i-supervision level. His wife is a strong support and tries to do too much for him, but feels she needs to do something while here. Will await therapy evaluations and work on a safe plan for him.WIll monitor to see if neuro-psych needed.    Elease Hashimoto 05/12/2017, 2:55 PM               Koby Pickup, [name], LCSW  Social Worker  Physical Medicine and Rehabilitation  Progress Notes  Signed  Date of Service:  05/12/2017  2:55 PM       Related encounter: Admission (Discharged) from 05/11/2017 in [place]          _0 Hide copied text  _1 Hover for details   Social Work  Social Work Assessment and Plan   Patient Details  Name: Edwin Jones MRN: [id number] Date of Birth: 08/10/1940   Today's Date: 05/12/2017   Problem List:      Patient Active Problem List    Diagnosis Date Noted  . CKD (chronic kidney disease) 05/11/2017  . Infarction of left basal ganglia ([place]) 05/11/2017  . Idiopathic hypotension 05/09/2017  . Stroke ([place]) 05/06/2017  . Stroke (cerebrum) ([place]) 05/06/2017  . Acute ischemic left MCA stroke ([place]) 05/06/2017  . Cancer of upper lobe of left lung ([place]) 10/30/2016  . Pure hypercholesterolemia 06/22/2014  . GERD without esophagitis 06/22/2014  . Benign essential hypertension 06/22/2014  . Diabetes mellitus without complication ([place]) 37/62/8315    Past Medical History:      Past Medical History:  Diagnosis Date  . Cancer ([place])    . COPD (chronic obstructive pulmonary disease) ([place])    . Diabetes mellitus without complication ([place])    . GERD without esophagitis    . Hepatitis C virus      history of hep C treated with Harvoni  . Hypercholesterolemia    . Hypertension    . Seasonal  allergies      Past Surgical History:       Past Surgical History:  Procedure Laterality Date  . COLONOSCOPY        approximately 11 years ago  . FLEXIBLE BRONCHOSCOPY N/A 11/05/2016    Procedure: FLEXIBLE BRONCHOSCOPY;  Surgeon: Wilhelmina Mcardle, MD;  Location: [facility] ORS;  Service: Pulmonary;  Laterality: N/A;  . IR FLUORO GUIDE CV LINE RIGHT   05/06/2017  . IR INTRAVSC STENT CERV CAROTID W/O EMB-PROT MOD SED INC ANGIO   05/06/2017  . IR PERCUTANEOUS ART THROMBECTOMY/INFUSION  INTRACRANIAL INC DIAG ANGIO   05/06/2017  . IR US GUIDE VASC ACCESS LEFT   05/06/2017  . IR US GUIDE VASC ACCESS RIGHT   05/06/2017  . IR US GUIDE VASC ACCESS RIGHT   05/06/2017  . PORTA CATH INSERTION N/A 11/23/2016    Procedure: Glori Luis Cath Insertion;  Surgeon: Algernon Huxley, MD;  Location: [place] CV LAB;  Service: Cardiovascular;  Laterality: N/A;  . RADIOLOGY WITH ANESTHESIA N/A 05/06/2017    Procedure: IR WITH ANESTHESIA;  Surgeon: Radiologist, Medication, MD;  Location: [place];  Service: Radiology;  Laterality: N/A;  . TEE WITHOUT CARDIOVERSION N/A 05/10/2017    Procedure: TRANSESOPHAGEAL ECHOCARDIOGRAM (TEE);  Surgeon: Sanda Klein, MD;  Location: [facility] ENDOSCOPY;  Service: Cardiovascular;  Laterality: N/A;  . TONSILLECTOMY        Social History:  reports that he quit smoking about 13 years ago. His smoking use included cigarettes. He has a 50.00 pack-year smoking history. he has never used smokeless tobacco. He reports that he does not drink alcohol or use drugs.   Family / Support Systems Marital Status: Married Patient Roles: Spouse, Parent, Other (Comment)(church deacon) Spouse/Significant Other: [name] [phone] Children: [name]-son  Other Supports: grandchildren are close and local Anticipated Caregiver: Wife Ability/Limitations of Caregiver: Wife has health issues and needs to be careful-heart issues and SOB at times Caregiver Availability: 24/7 Family Dynamics: Close knit  family who are there for one another, they have a good church family who visit and send their prayers. Pt is one who is very independent and wants to take care of himself, he was even with chemo and rad treatments.   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    Abuse/Neglect Abuse/Neglect Assessment Can Be Completed: Yes Physical Abuse: Denies Verbal Abuse: Denies Sexual Abuse: Denies Exploitation of patient/patient's resources: Denies Self-Neglect: Denies   Emotional Status Pt's affect, behavior adn adjustment status: Pt is motivated to do well and will do anything the therapy team asks of him to make improvements. His wife is here and trying to help but needs to let the therapists do their evaluations. She is willing to help while here just like being at home Recent Psychosocial Issues: other health issues had just finished his rad and chemo treatments for his lung cancer prior to coming into the hospital Pyschiatric History: No history deferred depression screen due to doing well and still adjusting to the new unit and program. He is coping appropriately and is talking about his feelings. Will monitor and have neruo-psych see while here if would be beneficial Substance Abuse History: No issues-quit tobacco years ago   Patient / Family Perceptions, Expectations & Goals Pt/Family understanding of illness & functional limitations: Pt and wife can explain his stroke and deficits. They both have spoken with the MD and feel thier questions and concerns have been addressed. Wife plans to be here daily and ask the MD her questions that come up. She is not one to shy away  from finding out the answers she seeks. 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, father, grandfather, retiree, church deacon, friend, etc Anticipated changes in roles/activities/participation: resume Pt/family expectations/goals: Pt states: " I want to be able to take care of myself before I leave here."  Wife states: " I hope he can get back to what he was doing before this, I need him too."   [employer]: Other (Comment)(Cancer [facility]) Premorbid Home Care/DME Agencies: None Transportation available at discharge: Wife Resource referrals recommended: Support group (specify)   Discharge Planning Living Arrangements: Spouse/significant other Support Systems: Spouse/significant other, Children, Other relatives, Friends/neighbors, Social worker community Type of Residence: Private residence Insurance Resources: [employer] Financial Resources: Radio broadcast assistant Screen Referred: No Living Expenses: Education officer, community Management: Patient, Spouse Does the patient have any problems obtaining your medications?: No Home Management: Wife Patient/Family Preliminary Plans: Return home with wife who is willing to assist and maybe do too much for him at the expense of her own health issues. Glad she will be here daily and see him in therpaies so she will see what he can do for himself and what he will need assist with. Will await therapy team evaluations and work on discharge needs. Social Work Anticipated Follow Up Needs: Support Group, HH/OP   Clinical Impression Pleasant gentleman who is willing to push himself in therapies to achieve his goals of mod/i-supervision level. His wife is a strong support and tries to do too much for him, but feels she needs to do something while here. Will await therapy evaluations and work on a safe plan for him.WIll monitor to see if neuro-psych needed.    Elease Hashimoto 05/12/2017, 2:55 PM               Tashianna Broome, [name],  LCSW  Social Worker  Physical Medicine and Rehabilitation  Patient Care Conference  Signed  Date of Service:  05/12/2017  2:36 PM          Signed          _0 Hide copied text  _1 Hover for details   Inpatient RehabilitationTeam Conference and Plan of Care Update Date: 05/12/2017   Time: 11:20 AM      Patient Name: Edwin Jones      Medical Record Number: [id number]  Date of Birth: October 29, 1940 Sex: Male         Room/Bed: 4W10C/4W10C-01 Payor Info: Payor: MEDICARE / Plan: MEDICARE PART A AND B / Product Type: *No Product type* /     Admitting Diagnosis: STROKE  CVA  Admit Date/Time:  05/11/2017  3:27 PM Admission Comments: No comment available    Primary Diagnosis:  <principal problem not specified> Principal Problem: <principal problem not specified>       Patient Active Problem List    Diagnosis Date Noted  . CKD (chronic kidney disease) 05/11/2017  . Infarction of left basal ganglia ([place]) 05/11/2017  . Idiopathic hypotension 05/09/2017  . Stroke ([place]) 05/06/2017  . Stroke (cerebrum) ([place]) 05/06/2017  . Acute ischemic left MCA stroke ([place]) 05/06/2017  . Cancer of upper lobe of left lung ([place]) 10/30/2016  . Pure hypercholesterolemia 06/22/2014  . GERD without esophagitis 06/22/2014  . Benign essential hypertension 06/22/2014  . Diabetes mellitus without complication ([place]) 49/82/6415      Expected Discharge Date:     Team Members Present: Physician leading conference: Dr. Alysia Penna Social Worker Present: Ovidio Kin, LCSW Nurse Present: Rayetta Pigg, RN OT Present: Cherylynn Ridges, OT SLP Present: Weston Anna, SLP PPS Coordinator present : Daiva Nakayama, RN, CRRN       Current Status/Progress Goal Weekly Team Focus  Medical       adjusting meds and checking labs   medical stable     Bowel/Bladder  Incontinent of B/B  Regain continence  Timed toileting   Swallow/Nutrition/ Hydration               ADL's     min A overall with delayed  processing, expressive aphasia, memory deficits, R side incoordination  supervision overall  ADL training, balance, functional mobilty, RUE Coordination, cognitive activites, pt/ family education   Mobility     eval pending  eval pending  eval pending   Communication     eval pending  complete eval      Safety/Cognition/ Behavioral Observations   eval pending  complete eval      Pain     Denies Pain  Pain < 2  Assess Qshift and PRN   Skin     Skin intact  Prevent skin breakdown and infection  Assess qshift and PRN     *See Care Plan and progress notes for long and short-term goals.      Barriers to Discharge   Current Status/Progress Possible Resolutions Date Resolved   Physician                    Nursing                 PT                    OT                 SLP            SW              Discharge Planning/Teaching Needs:    Home with wife who can provide supervision level she has health issues of her own. She is here today and participating in his therapies, at times needs to sit down and relax.     Team Discussion:  New evaluation and setting goals in therapies. MD adjusting meds and checking labs.  Revisions to Treatment Plan:  New eval      Elease Hashimoto 05/12/2017, 2:36 PM                  Bruchy Mikel, [name] 05/25/2017, 9:44 AM

## 2017-05-25 NOTE — Progress Notes (Signed)
Physical Therapy Session Note  Patient Details  Name: Edwin Jones MRN: [id number] Date of Birth: 1940-06-20  Today's Date: 05/25/2017 PT Individual Time: 8841-6606 PT Individual Time Calculation (min): 44 min   Short Term Goals: Week 1:  PT Short Term Goal 1 (Week 1): Pt will transition to EOB with min assist PT Short Term Goal 2 (Week 1): Pt will transfer in and out of wheelchair with mod assist PT Short Term Goal 3 (Week 1): Pt will ambulate 55' with LRAD and mod assist PT Short Term Goal 4 (Week 1): Pt will negotiate 4 steps with 2 rails and max assist  Skilled Therapeutic Interventions/Progress Updates:    Pt seated in w/c upon PT arrival, agreeable to therapy tx and denies pain. Pt transported from room>gym in w/c total assist. Pt ambulated 2 x 25 ft with mirror for visual feedback and L handrail for balance, verbal and tactile cues for upright posture, manual facilitation for R hip flexion initiating swing. Pt transported back to room in w/c. Pt performed stand pivot transfer from w.c>bed with mod assist, manual facilitation for weightshifting. Pt transferred to supine with min assist to help lift R LE, verbal cues for techniques. Pt left supine in bed with needs in reach and bed alarm set.   Therapy Documentation Precautions:  Precautions Precautions: Fall Precaution Comments: apraxic, R hemi, R inattention Restrictions Weight Bearing Restrictions: No   See Function Navigator for Current Functional Status.   Therapy/Group: Individual Therapy  Netta Corrigan, PT, DPT 05/25/2017, 12:59 PM

## 2017-05-25 NOTE — Progress Notes (Signed)
Occupational Therapy Session Note  Patient Details  Name: Edwin Jones MRN: [id number] Date of Birth: [birth year]  Today's Date: 05/25/2017 OT Individual Time: 1300-1415 OT Individual Time Calculation (min): 75 min   Short Term Goals: Week 1:  OT Short Term Goal 1 (Week 1): Pt will be able to use R hand to bring wash cloth to face with min A. OT Short Term Goal 2 (Week 1): Pt will demonstrate improved processing by donning a shirt in 1-2 minutes (vs 5 minutes). OT Short Term Goal 3 (Week 1): Pt will demonstrate improved dynamic sitting balance by donning pants over R leg from a seated position with Min A OT Short Term Goal 4 (Week 1): Pt will demonstrate improved activity tolerance to stand at the sink with min A to complete oral care.  Skilled Therapeutic Interventions/Progress Updates:    Pt declined any bathing/dressing today by head nod, told pt tomorrow we needed to shower and pt nodded head yes. Pt brought to less distracting gym for attention deficits. Stand-pivot to therapy mat with max A and facilitation to elicit pivot 2/2 motor planning deficits. Utilized graded peg board task for R UE attention and neuro re-ed. Pt needed elbow support and hand over hand A initially to begin task, then was able to maintain grip on smaller pegs. Able to recruit wrist extensors as well to help place pegs in holes. Pt needed 3 rest breaks and increased time to complete task. Mod cues to look to R and locate pegs. Pt returned to room and was taken to the bathroom even though pt shook his head no when asked. Stand-pivot to L to raised commode seat with max A. Pt needed assistance for 3/3 toileting steps but was able to void bladder in commode. Max A to stand-pivot back to wc with use of grab bars. B UE coordination with hand washing task at the sink. Hand over hand to integrate R UE into task. Pt left seated in wc at end of session with safety belt on and friends present.   Therapy Documentation Precautions:   Precautions Precautions: Fall Precaution Comments: apraxic, R hemi, R inattention Restrictions Weight Bearing Restrictions: No Pain:  none/denies pain ADL: ADL ADL Comments: Please see functional navigator  See Function Navigator for Current Functional Status.   Therapy/Group: Individual Therapy  Valma Cava 05/25/2017, 2:20 PM

## 2017-05-25 NOTE — Progress Notes (Signed)
Recreational Therapy Session Note  Patient Details  Name: Edwin Jones MRN: [id number] Date of Birth: Dec 06, 1940 Today's Date: 05/25/2017  TR eval deferred as pt is not yet appropriate for TR services.  Will continue to monitor through team for future participation.  [place] 05/25/2017, 3:59 PM

## 2017-05-26 ENCOUNTER — Inpatient Hospital Stay (HOSPITAL_COMMUNITY): Payer: Medicare Other | Admitting: Physical Therapy

## 2017-05-26 ENCOUNTER — Inpatient Hospital Stay (HOSPITAL_COMMUNITY): Payer: Medicare Other

## 2017-05-26 ENCOUNTER — Inpatient Hospital Stay (HOSPITAL_COMMUNITY): Payer: Medicare Other | Admitting: Speech Pathology

## 2017-05-26 ENCOUNTER — Inpatient Hospital Stay (HOSPITAL_COMMUNITY): Payer: Medicare Other | Admitting: Occupational Therapy

## 2017-05-26 LAB — GLUCOSE, CAPILLARY
GLUCOSE-CAPILLARY: 236 mg/dL — AB (ref 65–99)
GLUCOSE-CAPILLARY: 245 mg/dL — AB (ref 65–99)
Glucose-Capillary: 195 mg/dL — ABNORMAL HIGH (ref 65–99)
Glucose-Capillary: 155 mg/dL — ABNORMAL HIGH (ref 65–99)

## 2017-05-26 NOTE — Progress Notes (Signed)
Subjective/Complaints: No issues overnite, eating ok without cough  ROS: ? Y/N accuracy ?poor sleep  Objective: Vital Signs: Blood pressure 135/71, pulse 73, temperature 98.9 F (37.2 C), temperature source Oral, resp. rate 16, height '6\' 3"'$  (1.905 m), weight 94 kg (207 lb 3.7 oz), SpO2 100 %. No results found. Results for orders placed or performed during the hospital encounter of 05/20/17 (from the past 72 hour(s))  Glucose, capillary     Status: Abnormal   Collection Time: 05/23/17 11:37 AM  Result Value Ref Range   Glucose-Capillary 221 (H) 65 - 99 mg/dL  Glucose, capillary     Status: Abnormal   Collection Time: 05/23/17  4:52 PM  Result Value Ref Range   Glucose-Capillary 135 (H) 65 - 99 mg/dL  Glucose, capillary     Status: Abnormal   Collection Time: 05/23/17  9:16 PM  Result Value Ref Range   Glucose-Capillary 176 (H) 65 - 99 mg/dL  Glucose, capillary     Status: Abnormal   Collection Time: 05/24/17  6:30 AM  Result Value Ref Range   Glucose-Capillary 210 (H) 65 - 99 mg/dL  Glucose, capillary     Status: Abnormal   Collection Time: 05/24/17 12:19 PM  Result Value Ref Range   Glucose-Capillary 221 (H) 65 - 99 mg/dL  Glucose, capillary     Status: Abnormal   Collection Time: 05/24/17  4:24 PM  Result Value Ref Range   Glucose-Capillary 181 (H) 65 - 99 mg/dL  Glucose, capillary     Status: Abnormal   Collection Time: 05/24/17  9:55 PM  Result Value Ref Range   Glucose-Capillary 245 (H) 65 - 99 mg/dL  Glucose, capillary     Status: Abnormal   Collection Time: 05/25/17  6:30 AM  Result Value Ref Range   Glucose-Capillary 208 (H) 65 - 99 mg/dL  Glucose, capillary     Status: Abnormal   Collection Time: 05/25/17 11:42 AM  Result Value Ref Range   Glucose-Capillary 179 (H) 65 - 99 mg/dL  Glucose, capillary     Status: Abnormal   Collection Time: 05/25/17  4:42 PM  Result Value Ref Range   Glucose-Capillary 140 (H) 65 - 99 mg/dL  Glucose, capillary     Status:  Abnormal   Collection Time: 05/25/17  9:23 PM  Result Value Ref Range   Glucose-Capillary 224 (H) 65 - 99 mg/dL  Glucose, capillary     Status: Abnormal   Collection Time: 05/26/17  6:22 AM  Result Value Ref Range   Glucose-Capillary 236 (H) 65 - 99 mg/dL     HEENT: poor dentition Cardio:RRR without murmur. No JVD  Resp: CTA Bilaterally without wheezes or rales. Normal effort  GI: BS positive and non tender Extremity:  No Edema Skin:   Intact and Other portacath site intact Neuro: lethargic and aphasic.  Abnormal Motor 3/5 RUE, 4/5 RLE , 5/5 on Left side,  Right inattention.  Musc/Skel:  Other no pain with UE or LE ROM Gen NAD   Assessment/Plan: 1. 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 Physiatrist is providing close team supervision and 24 hour management of active medical problems listed below. Physiatrist and rehab team continue to assess barriers to discharge/monitor patient progress toward functional and medical goals. FIM: Function - Bathing Position: Shower Body parts bathed by patient: Right arm, Chest, Abdomen, Front perineal area, Left upper leg Body parts bathed by  helper: Right upper leg, Left upper leg, Right lower leg, Buttocks, Left arm Assist Level: (moderate assist)  Function- Upper Body Dressing/Undressing What is the patient wearing?: Pull over shirt/dress Pull over shirt/dress - Perfomed by helper: Thread/unthread right sleeve, Thread/unthread left sleeve, Put head through opening, Pull shirt over trunk Assist Level: (helper did all, total assist) Function - Lower Body Dressing/Undressing What is the patient wearing?: Non-skid slipper socks, Pants Position: Wheelchair/chair at sink Pants- Performed by helper: Pull pants up/down, Thread/unthread right pants leg, Thread/unthread left pants leg Non-skid slipper socks-  Performed by helper: Don/doff left sock, Don/doff right sock Assist for footwear: Dependant Assist for lower body dressing: (Total A)  Function - Toileting Toileting activity did not occur: No continent bowel/bladder event Toileting steps completed by helper: Adjust clothing prior to toileting, Adjust clothing after toileting, Performs perineal hygiene Assist level: Touching or steadying assistance (Pt.75%)(Max A)  Function - Air cabin crew transfer activity did not occur: N/A Toilet transfer assistive device: Grab bar, Elevated toilet seat/BSC over toilet Assist level to toilet: Maximal assist (Pt 25 - 49%/lift and lower) Assist level from toilet: Maximal assist (Pt 25 - 49%/lift and lower)  Function - Chair/bed transfer Chair/bed transfer method: Stand pivot Chair/bed transfer assist level: Moderate assist (Pt 50 - 74%/lift or lower) Chair/bed transfer assistive device: Armrests Chair/bed transfer details: Verbal cues for technique, Manual facilitation for weight shifting  Function - Locomotion: Wheelchair Will patient use wheelchair at discharge?: Yes Type: Manual Max wheelchair distance: 150 Assist Level: Supervision or verbal cues Assist Level: Supervision or verbal cues Assist Level: Supervision or verbal cues Turns around,maneuvers to table,bed, and toilet,negotiates 3% grade,maneuvers on rugs and over doorsills: No Function - Locomotion: Ambulation Assistive device: Rail in hallway Max distance: 25' Assist level: Moderate assist (Pt 50 - 74%) Assist level: Moderate assist (Pt 50 - 74%) Walk 50 feet with 2 turns activity did not occur: Safety/medical concerns Walk 150 feet activity did not occur: Safety/medical concerns Walk 10 feet on uneven surfaces activity did not occur: Safety/medical concerns  Function - Comprehension Comprehension: Auditory Comprehension assist level: Understands basic 25 - 49% of the time/ requires cueing 50 - 75% of the  time  Function - Expression Expression: Verbal, Nonverbal Expression assist level: Expresses basis less than 25% of the time/requires cueing >75% of the time.  Function - Social Interaction Social Interaction assist level: Interacts appropriately 25 - 49% of time - Needs frequent redirection.  Function - Problem Solving Problem solving assist level: Solves basic less than 25% of the time - needs direction nearly all the time or does not effectively solve problems and may need a restraint for safety  Function - Memory Memory assist level: Recognizes or recalls less than 25% of the time/requires cueing greater than 75% of the time Patient normally able to recall (first 3 days only): That he or she is in a hospital  Medical Problem List and Plan: 1.   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 Formulized individual treatment plan based on medical history, underlying problem and comorbidities. 2. DVT Prophylaxis/Anticoagulation: Pharmaceutical:Heparin--continue 3. Pain Management:tylenol prn 4. Mood:LCSW to follow for evaluation when appropriate. 5. Neuropsych: This patientis notcapable of making decisions on hisown behalf. 6. Skin/Wound Care:routine pressure relief measures. 7. Fluids/Electrolytes/Nutrition:Monitor I/O. Continue to offer supplements between meals. 8.Hypertension:Hypotensionhs resolved and patient offMidodrine and Florinef  9. 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 at baseline BMP Latest Ref Rng & Units 05/21/2017 05/20/2017 05/19/2017  Glucose 65 - 99 mg/dL 198(H) 171(H) 196(H)  BUN 6 - 20 mg/dL '20 15 16  '$ Creatinine 0.61 - 1.24 mg/dL 1.75(H) 1.87(H) 1.88(H)  Sodium 135 - 145 mmol/L 134(L) 135 135  Potassium 3.5 - 5.1 mmol/L 4.5 4.3 4.5  Chloride  101 - 111 mmol/L 102 104 105  CO2 22 - 32 mmol/L 21(L) 22 21(L)  Calcium 8.9 - 10.3 mg/dL 8.9 8.6(L) 8.7(L)   10. ABLA: Monitor H/H for signs of bleedinghas been in 10- 11 range. 11 on 3/15 11. LLL/LUL TXL:[id number] 12. Lethargy:  Trial Ritalin with some improvement in arousal noted 13. Oral thrush:  nystatin mouthwash as well as oral care every 4 hours while awake. 14. COPD/Left lung cancer: On Breo with albuterol prn.  15.DM: poorly controlled     -SSI   -added low dose amaryl on 3/16, increase to '2mg'$  on 3/20-monitor CBG (last 3)  Recent Labs    05/25/17 1642 05/25/17 2123 05/26/17 0622  GLUCAP 140* 224* 236*    LOS (Days) 6 A FACE TO FACE EVALUATION WAS PERFORMED  Luanna Salk Jaksen Fiorella 05/26/2017, 7:53 AM

## 2017-05-26 NOTE — Progress Notes (Signed)
Occupational Therapy Session Note  Patient Details  Name: Edwin Jones MRN: [id number] Date of Birth: 26-Feb-1941  Today's Date: 05/26/2017 OT Individual Time: 0930-1030 OT Individual Time Calculation (min): 60 min    Short Term Goals: Week 1:  OT Short Term Goal 1 (Week 1): Pt will be able to use R hand to bring wash cloth to face with min A. OT Short Term Goal 2 (Week 1): Pt will demonstrate improved processing by donning a shirt in 1-2 minutes (vs 5 minutes). OT Short Term Goal 3 (Week 1): Pt will demonstrate improved dynamic sitting balance by donning pants over R leg from a seated position with Min A OT Short Term Goal 4 (Week 1): Pt will demonstrate improved activity tolerance to stand at the sink with min A to complete oral care.  Skilled Therapeutic Interventions/Progress Updates:    1:1 Pt in bed when arrived. Self care retraining with focus on transfer training, sit to stands, and completing functional tasks. Pt came to EOB with HOB elevated with min A with extra time. Pt performed stand pivot transfer with min A with more than reasonable time and a slightly elevated bed level. Pt transferred to the [facility] over the commode with min A with again extra time with min A with use of grab bar with total A for clothing management. Pt began to urinate in transfer. Pt with continent  void of bowel and urine. Pt able to ambulate with mod A with grab bar and HHA from toilet to shower stall with max multimodal cues for organization of body. Pt notable for fatigue and unable to complete fully bathing parts but able to rinse with hand held shower head. Pt able to don shirt with extra time and and proper set up/ orientation of shirt. Pt transfer to w/c and transitioned out into the room. Focus on hemi dressing techniques with setup extra time for functional problem solving. Focus on sit to stands pushing up from w/c with bilateral UEs with mod A to come into full upright position. Pt able to stand with mod  A while pulling up pants with left hand. Pt left sitting up in w/c with quick release belt. Pt with increased fatigue at end of session.  Therapy Documentation Precautions:  Precautions Precautions: Fall Precaution Comments: apraxic, R hemi, R inattention Restrictions Weight Bearing Restrictions: No Pain:  no c/o pain  ADL: ADL ADL Comments: Please see functional navigator  See Function Navigator for Current Functional Status.   Therapy/Group: Individual Therapy  Willeen Cass [facility] 05/26/2017, 3:39 PM

## 2017-05-26 NOTE — Patient Care Conference (Signed)
Inpatient RehabilitationTeam Conference and Plan of Care Update Date: 05/26/2017   Time: 11;20 AM    Patient Name: Edwin Jones      Medical Record Number: [id number]  Date of Birth: 04-Jul-1940 Sex: Male         Room/Bed: 4W16C/4W16C-01 Payor Info: Payor: MEDICARE / Plan: MEDICARE PART A AND B / Product Type: *No Product type* /    Admitting Diagnosis: Stroke CVA  Admit Date/Time:  05/20/2017  4:30 PM Admission Comments: No comment available   Primary Diagnosis:  <principal problem not specified> Principal Problem: <principal problem not specified>  Patient Active Problem List   Diagnosis Date Noted  . SOB (shortness of breath) on exertion   . Hemiparesis affecting right side as late effect of stroke ([place])   . Aphasia as late effect of stroke   . Small cell lung cancer, left upper lobe ([place])   . Nontraumatic acute hemorrhage of basal ganglia ([place]) 05/17/2017  . Anterior cerebral circulation hemorrhagic infarction 05/17/2017  . Pneumonia   . Cerebral edema ([facility])   . Cough   . Fever, unspecified   . Orthostatic hypotension   . Labile blood pressure   . Acute blood loss anemia   . Diabetes mellitus type 2 in nonobese ([facility])   . Labile blood glucose   . CKD (chronic kidney disease) 05/11/2017  . Infarction of left basal ganglia ([place]) 05/11/2017  . Idiopathic hypotension 05/09/2017  . Stroke ([place]) 05/06/2017  . Stroke (cerebrum) ([place]) 05/06/2017  . Acute ischemic left MCA stroke ([place]) 05/06/2017  . Cancer of upper lobe of left lung ([place]) 10/30/2016  . Pure hypercholesterolemia 06/22/2014  . GERD without esophagitis 06/22/2014  . Benign essential hypertension 06/22/2014  . Diabetes mellitus without complication ([place]) 93/79/0240    Expected Discharge Date: Expected Discharge Date: 06/15/17  Team Members Present: Physician leading conference: Dr. Alysia Penna Social Worker Present: Ovidio Kin, LCSW Nurse Present: Arelia Sneddon, RN PT Present: Dwyane Dee,  PT;Emily Benn [place], PT OT Present: Willeen Cass, OT SLP Present: Weston Anna, SLP PPS Coordinator present : Daiva Nakayama, RN, CRRN     Current Status/Progress Goal Weekly Team Focus  Medical   poor communication, dysphagia, asp PNA resolved  avoid recurrent PNA  work on cough   Bowel/Bladder   incontinent of B/B; lbm 3/18  timed toileting  toilet while awake q3hrs   Swallow/Nutrition/ Hydration   Dys. 2 textures with thin liquids, Min-Mod A for use of strategies   Min A  use of strategies, possible trials of upgraded textures    ADL's   Max A overall, apraxia, R hemiplegia  Supervision, lofty  modified bathing/dressing, initiation, R UE NMR, R attention, motor planning   Mobility   max assist overall  lofty supervision  NMR, balance, functional mobility, cognition   Communication   Max A   Min A   basic auditory comprehension, yes/no accuracy, naming    Safety/Cognition/ Behavioral Observations  Max A  Mod A   problem solving    Pain   denies pain; faces=0  <2  assess q shift and prn   Skin   abd bruising;  free from skin breakdown and/or infection  assess q shift and prn      *See Care Plan and progress notes for long and short-term goals.     Barriers to Discharge  Current Status/Progress Possible Resolutions Date Resolved   Physician    Medical stability     Slow progress, some inmprovement with level of  alertness  Cont rehab      Nursing                  PT                    OT                  SLP                SW Decreased caregiver support Wife has health issues of her own            Discharge Planning/Teaching Needs:  Wife wants to take him home, but has health issues of her own, so will depend upon how much care he requires. She is here daily and observed in therapies.      Team Discussion:  Goals supervision-min assist level. Working on timed toileting. Yes/no better and attention better. Dys 2 thin liquid diet. Fatigues easily and needs time  to be allowed to do the tasks. Making good progress in therapies. Off antibiotics now for the pneumonia.  Revisions to Treatment Plan:  DC 4/9    Continued Need for Acute Rehabilitation Level of Care: The patient requires daily medical management by a physician with specialized training in physical medicine and rehabilitation for the following conditions: Daily direction of a multidisciplinary physical rehabilitation program to ensure safe treatment while eliciting the highest outcome that is of practical value to the patient.: Yes Daily medical management of patient stability for increased activity during participation in an intensive rehabilitation regime.: Yes Daily analysis of laboratory values and/or radiology reports with any subsequent need for medication adjustment of medical intervention for : Neurological problems  Jaquil Todt, [name] 05/26/2017, 1:35 PM

## 2017-05-26 NOTE — Progress Notes (Signed)
Social Work   Edwin Jones, Edwin Jones  Social Worker  Physical Medicine and Rehabilitation  Patient Care Conference  Signed  Date of Service:  05/26/2017  1:34 PM          Signed          _0 Hide copied text  _1 Hover for details   Inpatient RehabilitationTeam Conference and Plan of Care Update Date: 05/26/2017   Time: 11;20 AM      Patient Name: Edwin Jones      Medical Record Number: [id number]  Date of Birth: [birth year] Sex: Male         Room/Bed: 4W16C/4W16C-01 Payor Info: Payor: MEDICARE / Plan: MEDICARE PART A AND B / Product Type: *No Product type* /     Admitting Diagnosis: Stroke CVA  Admit Date/Time:  05/20/2017  4:30 PM Admission Comments: No comment available    Primary Diagnosis:  <principal problem not specified> Principal Problem: <principal problem not specified>       Patient Active Problem List    Diagnosis Date Noted  . SOB (shortness of breath) on exertion    . Hemiparesis affecting right side as late effect of stroke ([place])    . Aphasia as late effect of stroke    . Small cell lung cancer, left upper lobe ([place])    . Nontraumatic acute hemorrhage of basal ganglia ([place]) 05/17/2017  . Anterior cerebral circulation hemorrhagic infarction 05/17/2017  . Pneumonia    . Cerebral edema (HCC)    . Cough    . Fever, unspecified    . Orthostatic hypotension    . Labile blood pressure    . Acute blood loss anemia    . Diabetes mellitus type 2 in nonobese (HCC)    . Labile blood glucose    . CKD (chronic kidney disease) 05/11/2017  . 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 Idiopathic hypotension 05/09/2017  . Stroke ([place]) 05/06/2017  . Stroke (cerebrum) ([place]) 05/06/2017  . Acute ischemic left MCA stroke ([place]) 05/06/2017  . Cancer of upper lobe of left lung ([place]) 10/30/2016  . Pure hypercholesterolemia 06/22/2014  . GERD without esophagitis 06/22/2014  . Benign essential hypertension 06/22/2014  . Diabetes mellitus without  complication ([place]) 03/17/3233      Expected Discharge Date: Expected Discharge Date: 06/15/17   Team Members Present: Physician leading conference: Dr. Alysia Penna Social Worker Present: Edwin Jones Kin, LCSW Nurse Present: Arelia Sneddon, RN PT Present: Dwyane Dee, PT;Emily Benn Moulder, PT OT Present: Willeen Cass, OT SLP Present: Weston Anna, SLP PPS Coordinator present : Daiva Nakayama, RN, CRRN       Current Status/Progress Goal Weekly Team Focus  Medical     poor communication, dysphagia, asp PNA resolved  avoid recurrent PNA  work on cough   Bowel/Bladder     incontinent of B/B; lbm 3/18  timed toileting  toilet while awake q3hrs   Swallow/Nutrition/ Hydration     Dys. 2 textures with thin liquids, Min-Mod A for use of strategies   Min A  use of strategies, possible trials of upgraded textures    ADL's     Max A overall, apraxia, R hemiplegia  Supervision, lofty  modified bathing/dressing, initiation, R UE NMR, R attention, motor planning   Mobility     max assist overall  lofty supervision  NMR, balance, functional mobility, cognition   Communication     Max A   Min A   basic auditory comprehension, yes/no accuracy, naming  Safety/Cognition/ Behavioral Observations   Max A  Mod A   problem solving    Pain     denies pain; faces=0  <2  assess q shift and prn   Skin     abd bruising;  free from skin breakdown and/or infection  assess q shift and prn     *See Care Plan and progress notes for long and short-term goals.      Barriers to Discharge   Current Status/Progress Possible Resolutions Date Resolved   Physician     Medical stability     Slow progress, some inmprovement with level of alertness  Cont rehab      Nursing                 PT                    OT                 SLP            SW Decreased caregiver support Wife has health issues of her own             Discharge Planning/Teaching Needs:  Wife wants to take him home, but  has health issues of her own, so will depend upon how much care he requires. She is here daily and observed in therapies.      Team Discussion:  Goals supervision-min assist level. Working on timed toileting. Yes/no better and attention better. Dys 2 thin liquid diet. Fatigues easily and needs time to be allowed to do the tasks. Making good progress in therapies. Off antibiotics now for the pneumonia.  Revisions to Treatment Plan:  DC 4/9    Continued Need for Acute Rehabilitation Level of Care: The patient requires daily medical management by a physician with specialized training in physical medicine and rehabilitation for the following conditions: Daily direction of a multidisciplinary physical rehabilitation program to ensure safe treatment while eliciting the highest outcome that is of practical value to the patient.: Yes Daily medical management of patient stability for increased activity during participation in an intensive rehabilitation regime.: Yes Daily analysis of laboratory values and/or radiology reports with any subsequent need for medication adjustment of medical intervention for : Neurological problems   Edwin Jones, Edwin Jones 05/26/2017, 1:35 PM                  Edwin Jones, Edwin Rhyme, LCSW  Social Worker  Physical Medicine and Rehabilitation  Progress Notes  Signed  Date of Service:  05/12/2017  2:55 PM       Related encounter: Admission (Discharged) from 05/11/2017 in [place]          _0 Hide copied text  _1 Hover for details   Social Work  Social Work Assessment and Plan   Patient Details  Name: Edwin Jones MRN: [id number] Date of Birth: 1940-05-20   Today's Date: 05/12/2017   Problem List:      Patient Active Problem List    Diagnosis Date Noted  . CKD (chronic kidney disease) 05/11/2017  . 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 Idiopathic hypotension 05/09/2017  . Stroke ([place]) 05/06/2017   . Stroke (cerebrum) ([place]) 05/06/2017  . Acute ischemic left MCA stroke ([place]) 05/06/2017  . Cancer of upper lobe of left lung ([place]) 10/30/2016  . Pure hypercholesterolemia 06/22/2014  . GERD without esophagitis 06/22/2014  .  Benign essential hypertension 06/22/2014  . Diabetes mellitus without complication ([place]) 26/83/4196    Past Medical History:      Past Medical History:  Diagnosis Date  . Cancer ([place])    . COPD (chronic obstructive pulmonary disease) ([place])    . Diabetes mellitus without complication ([place])    . GERD without esophagitis    . Hepatitis C virus      history of hep C treated with Harvoni  . Hypercholesterolemia    . Hypertension    . Seasonal allergies      Past Surgical History:       Past Surgical History:  Procedure Laterality Date  . COLONOSCOPY        approximately 11 years ago  . FLEXIBLE BRONCHOSCOPY N/A 11/05/2016    Procedure: FLEXIBLE BRONCHOSCOPY;  Surgeon: Wilhelmina Mcardle, MD;  Location: [facility] ORS;  Service: Pulmonary;  Laterality: N/A;  . IR FLUORO GUIDE CV LINE RIGHT   05/06/2017  . IR INTRAVSC STENT CERV CAROTID W/O EMB-PROT MOD SED INC ANGIO   05/06/2017  . IR PERCUTANEOUS ART THROMBECTOMY/INFUSION INTRACRANIAL INC DIAG ANGIO   05/06/2017  . IR US GUIDE VASC ACCESS LEFT   05/06/2017  . IR US GUIDE VASC ACCESS RIGHT   05/06/2017  . IR US GUIDE VASC ACCESS RIGHT   05/06/2017  . PORTA CATH INSERTION N/A 11/23/2016    Procedure: Glori Luis Cath Insertion;  Surgeon: Algernon Huxley, MD;  Location: [facility] CV LAB;  Service: Cardiovascular;  Laterality: N/A;  . RADIOLOGY WITH ANESTHESIA N/A 05/06/2017    Procedure: IR WITH ANESTHESIA;  Surgeon: Radiologist, Medication, MD;  Location: [facility];  Service: Radiology;  Laterality: N/A;  . TEE WITHOUT CARDIOVERSION N/A 05/10/2017    Procedure: TRANSESOPHAGEAL ECHOCARDIOGRAM (TEE);  Surgeon: Sanda Klein, MD;  Location: [facility] ENDOSCOPY;  Service: Cardiovascular;  Laterality: N/A;  . TONSILLECTOMY        Social History:   reports that he quit smoking about 13 years ago. His smoking use included cigarettes. He has a 50.00 pack-year smoking history. he has never used smokeless tobacco. He reports that he does not drink alcohol or use drugs.   Family / Support Systems Marital Status: Married Patient Roles: Spouse, Parent, Other (Comment)(church deacon) Spouse/Significant Other: [name] [phone] Children: [name]-son  Other Supports: grandchildren are close and local Anticipated Caregiver: Wife Ability/Limitations of Caregiver: Wife has health issues and needs to be careful-heart issues and SOB at times Caregiver Availability: 24/7 Family Dynamics: Close knit family who are there for one another, they have a good church family who visit and send their prayers. Pt is one who is very independent and wants to take care of himself, he was even with chemo and rad treatments.   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    Abuse/Neglect Abuse/Neglect Assessment Can Be Completed: Yes Physical Abuse: Denies Verbal Abuse: Denies Sexual Abuse: Denies Exploitation of patient/patient's resources: Denies Self-Neglect: Denies   Emotional Status Pt's affect, behavior adn adjustment status: Pt is motivated to do well and will do anything the therapy team asks of him to make improvements. His wife is here and trying to help but needs to let the therapists do their evaluations. She is willing to  help while here just like being at home Recent Psychosocial Issues: other health issues had just finished his rad and chemo treatments for his lung cancer prior to coming into the  hospital Pyschiatric History: No history deferred depression screen due to doing well and still adjusting to the new unit and program. He is coping appropriately and is talking about his feelings. Will monitor and have neruo-psych see while here if would be beneficial Substance Abuse History: No issues-quit tobacco years ago   Patient / Family Perceptions, Expectations & Goals Pt/Family understanding of illness & functional limitations: Pt and wife can explain his stroke and deficits. They both have spoken with the MD and feel thier questions and concerns have been addressed. Wife plans to be here daily and ask the MD her questions that come up. She is not one to shy away from finding out the answers she seeks. 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, father, grandfather, retiree, church deacon, friend, etc Anticipated changes in roles/activities/participation: resume Pt/family expectations/goals: Pt states: " I want to be able to take care of myself before I leave here."  Wife states: " I hope he can get back to what he was doing before this, I need him too."   [employer]: Other (Comment)(Cancer [employer]) Premorbid Home Care/DME Agencies: None Transportation available at discharge: Wife Resource referrals recommended: Support group (specify)   Discharge Planning Living Arrangements: Spouse/significant other Support Systems: Spouse/significant other, Children, Other relatives, Friends/neighbors, Social worker community Type of Residence: Private residence Insurance Resources: [employer] Financial Resources: Radio broadcast assistant Screen Referred: No Living Expenses: Education officer, community Management: Patient, Spouse Does the patient have any problems obtaining your medications?: No Home Management: Wife Patient/Family Preliminary Plans: Return home with wife who is willing to assist and maybe do too much for him at the expense of her own health issues. Glad she  will be here daily and see him in therpaies so she will see what he can do for himself and what he will need assist with. Will await therapy team evaluations and work on discharge needs. Social Work Anticipated Follow Up Needs: Support Group, HH/OP   Clinical Impression Pleasant gentleman who is willing to push himself in therapies to achieve his goals of mod/i-supervision level. His wife is a strong support and tries to do too much for him, but feels she needs to do something while here. Will await therapy evaluations and work on a safe plan for him.WIll monitor to see if neuro-psych needed.    Elease Hashimoto 05/12/2017, 2:55 PM               Hyde Sires, Edwin Rhyme, LCSW  Social Worker  Physical Medicine and Rehabilitation  Progress Notes  Signed  Date of Service:  05/12/2017  2:55 PM       Related encounter: Admission (Discharged) from 05/11/2017 in [place]          _0 Hide copied text  _1 Hover for details   Social Work  Social Work Assessment and Plan   Patient Details  Name: Edwin Jones MRN: [id number] Date of Birth: [birth year]   Today's Date: 05/12/2017   Problem List:      Patient Active Problem List    Diagnosis Date Noted  . CKD (chronic kidney disease) 05/11/2017  . Infarction of left basal ganglia ([place]) 05/11/2017  . Idiopathic hypotension 05/09/2017  . Stroke ([place]) 05/06/2017  . Stroke (cerebrum) ([place]) 05/06/2017  . Acute  ischemic left MCA stroke ([place]) 05/06/2017  . Cancer of upper lobe of left lung ([place]) 10/30/2016  . Pure hypercholesterolemia 06/22/2014  . GERD without esophagitis 06/22/2014  . Benign essential hypertension 06/22/2014  . Diabetes mellitus without complication ([place]) 32/20/2542    Past Medical History:      Past Medical History:  Diagnosis Date  . Cancer ([place])    . COPD (chronic obstructive pulmonary disease) ([place])    . Diabetes mellitus without complication ([place])    . GERD  without esophagitis    . Hepatitis C virus      history of hep C treated with Harvoni  . Hypercholesterolemia    . Hypertension    . Seasonal allergies      Past Surgical History:       Past Surgical History:  Procedure Laterality Date  . COLONOSCOPY        approximately 11 years ago  . FLEXIBLE BRONCHOSCOPY N/A 11/05/2016    Procedure: FLEXIBLE BRONCHOSCOPY;  Surgeon: Wilhelmina Mcardle, MD;  Location: [facility] ORS;  Service: Pulmonary;  Laterality: N/A;  . IR FLUORO GUIDE CV LINE RIGHT   05/06/2017  . IR INTRAVSC STENT CERV CAROTID W/O EMB-PROT MOD SED INC ANGIO   05/06/2017  . IR PERCUTANEOUS ART THROMBECTOMY/INFUSION INTRACRANIAL INC DIAG ANGIO   05/06/2017  . IR US GUIDE VASC ACCESS LEFT   05/06/2017  . IR US GUIDE VASC ACCESS RIGHT   05/06/2017  . IR US GUIDE VASC ACCESS RIGHT   05/06/2017  . PORTA CATH INSERTION N/A 11/23/2016    Procedure: Glori Luis Cath Insertion;  Surgeon: Algernon Huxley, MD;  Location: [place] CV LAB;  Service: Cardiovascular;  Laterality: N/A;  . RADIOLOGY WITH ANESTHESIA N/A 05/06/2017    Procedure: IR WITH ANESTHESIA;  Surgeon: Radiologist, Medication, MD;  Location: [facility];  Service: Radiology;  Laterality: N/A;  . TEE WITHOUT CARDIOVERSION N/A 05/10/2017    Procedure: TRANSESOPHAGEAL ECHOCARDIOGRAM (TEE);  Surgeon: Sanda Klein, MD;  Location: [facility] ENDOSCOPY;  Service: Cardiovascular;  Laterality: N/A;  . TONSILLECTOMY        Social History:  reports that he quit smoking about 13 years ago. His smoking use included cigarettes. He has a 50.00 pack-year smoking history. he has never used smokeless tobacco. He reports that he does not drink alcohol or use drugs.   Family / Support Systems Marital Status: Married Patient Roles: Spouse, Parent, Other (Comment)(church deacon) Spouse/Significant Other: [name] [phone] Children: [name]-son  Other Supports: grandchildren are close and local Anticipated Caregiver: Wife Ability/Limitations of  Caregiver: Wife has health issues and needs to be careful-heart issues and SOB at times Caregiver Availability: 24/7 Family Dynamics: Close knit family who are there for one another, they have a good church family who visit and send their prayers. Pt is one who is very independent and wants to take care of himself, he was even with chemo and rad treatments.   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    Abuse/Neglect Abuse/Neglect Assessment Can Be Completed: Yes Physical Abuse: Denies Verbal Abuse: Denies Sexual Abuse: Denies Exploitation of patient/patient's resources: Denies Self-Neglect: Denies   Emotional Status Pt's affect, behavior adn adjustment status: Pt is motivated to do well and will do  anything the therapy team asks of him to make improvements. His wife is here and trying to help but needs to let the therapists do their evaluations. She is willing to help while here just like being at home Recent Psychosocial Issues: other health issues had just finished his rad and chemo treatments for his lung cancer prior to coming into the hospital Pyschiatric History: No history deferred depression screen due to doing well and still adjusting to the new unit and program. He is coping appropriately and is talking about his feelings. Will monitor and have neruo-psych see while here if would be beneficial Substance Abuse History: No issues-quit tobacco years ago   Patient / Family Perceptions, Expectations & Goals Pt/Family understanding of illness & functional limitations: Pt and wife can explain his stroke and deficits. They both have spoken with the MD and feel  thier questions and concerns have been addressed. Wife plans to be here daily and ask the MD her questions that come up. She is not one to shy away from finding out the answers she seeks. 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, father, grandfather, retiree, church deacon, friend, etc Anticipated changes in roles/activities/participation: resume Pt/family expectations/goals: Pt states: " I want to be able to take care of myself before I leave here."  Wife states: " I hope he can get back to what he was doing before this, I need him too."   [employer]: Other (Comment)(Cancer [facility]) Premorbid Home Care/DME Agencies: None Transportation available at discharge: Wife Resource referrals recommended: Support group (specify)   Discharge Planning Living Arrangements: Spouse/significant other Support Systems: Spouse/significant other, Children, Other relatives, Friends/neighbors, Social worker community Type of Residence: Private residence Insurance Resources: [employer] Financial Resources: Radio broadcast assistant Screen Referred: No Living Expenses: Education officer, community Management: Patient, Spouse Does the patient have any problems obtaining your medications?: No Home Management: Wife Patient/Family Preliminary Plans: Return home with wife who is willing to assist and maybe do too much for him at the expense of her own health issues. Glad she will be here daily and see him in therpaies so she will see what he can do for himself and what he will need assist with. Will await therapy team evaluations and work on discharge needs. Social Work Anticipated Follow Up Needs: Support Group, HH/OP   Clinical Impression Pleasant gentleman who is willing to push himself in therapies to achieve his goals of mod/i-supervision level. His wife is a strong support and tries to do too much for him, but feels she needs to do something while here. Will await therapy evaluations and work on  a safe plan for him.WIll monitor to see if neuro-psych needed.    Elease Hashimoto 05/12/2017, 2:55 PM               Edwin Jones, Edwin Rhyme, LCSW  Social Worker  Physical Medicine and Rehabilitation  Progress Notes  Signed  Date of Service:  05/12/2017  2:55 PM       Related encounter: Admission (Discharged) from 05/11/2017 in [place]          _0 Hide copied text  _1 Hover for details   Social Work  Social Work Assessment and Plan   Patient Details  Name: Edwin Jones MRN: [id number] Date of Birth: 11-Jun-1940   Today's Date: 05/12/2017   Problem List:      Patient Active Problem List    Diagnosis Date Noted  . CKD (chronic  kidney disease) 05/11/2017  . Infarction of left basal ganglia ([place]) 05/11/2017  . Idiopathic hypotension 05/09/2017  . Stroke ([place]) 05/06/2017  . Stroke (cerebrum) ([place]) 05/06/2017  . Acute ischemic left MCA stroke ([place]) 05/06/2017  . Cancer of upper lobe of left lung ([place]) 10/30/2016  . Pure hypercholesterolemia 06/22/2014  . GERD without esophagitis 06/22/2014  . Benign essential hypertension 06/22/2014  . Diabetes mellitus without complication ([place]) 66/08/3014    Past Medical History:      Past Medical History:  Diagnosis Date  . Cancer ([place])    . COPD (chronic obstructive pulmonary disease) ([place])    . Diabetes mellitus without complication ([place])    . GERD without esophagitis    . Hepatitis C virus      history of hep C treated with Harvoni  . Hypercholesterolemia    . Hypertension    . Seasonal allergies      Past Surgical History:       Past Surgical History:  Procedure Laterality Date  . COLONOSCOPY        approximately 11 years ago  . FLEXIBLE BRONCHOSCOPY N/A 11/05/2016    Procedure: FLEXIBLE BRONCHOSCOPY;  Surgeon: Wilhelmina Mcardle, MD;  Location: [facility] ORS;  Service: Pulmonary;  Laterality: N/A;  . IR FLUORO GUIDE CV LINE RIGHT   05/06/2017  . IR INTRAVSC STENT CERV  CAROTID W/O EMB-PROT MOD SED INC ANGIO   05/06/2017  . IR PERCUTANEOUS ART THROMBECTOMY/INFUSION INTRACRANIAL INC DIAG ANGIO   05/06/2017  . IR US GUIDE VASC ACCESS LEFT   05/06/2017  . IR US GUIDE VASC ACCESS RIGHT   05/06/2017  . IR US GUIDE VASC ACCESS RIGHT   05/06/2017  . PORTA CATH INSERTION N/A 11/23/2016    Procedure: Glori Luis Cath Insertion;  Surgeon: Algernon Huxley, MD;  Location: [place] CV LAB;  Service: Cardiovascular;  Laterality: N/A;  . RADIOLOGY WITH ANESTHESIA N/A 05/06/2017    Procedure: IR WITH ANESTHESIA;  Surgeon: Radiologist, Medication, MD;  Location: [facility];  Service: Radiology;  Laterality: N/A;  . TEE WITHOUT CARDIOVERSION N/A 05/10/2017    Procedure: TRANSESOPHAGEAL ECHOCARDIOGRAM (TEE);  Surgeon: Sanda Klein, MD;  Location: [facility] ENDOSCOPY;  Service: Cardiovascular;  Laterality: N/A;  . TONSILLECTOMY        Social History:  reports that he quit smoking about 13 years ago. His smoking use included cigarettes. He has a 50.00 pack-year smoking history. he has never used smokeless tobacco. He reports that he does not drink alcohol or use drugs.   Family / Support Systems Marital Status: Married Patient Roles: Spouse, Parent, Other (Comment)(church deacon) Spouse/Significant Other: [name] [phone] Children: [name]-son  Other Supports: grandchildren are close and local Anticipated Caregiver: Wife Ability/Limitations of Caregiver: Wife has health issues and needs to be careful-heart issues and SOB at times Caregiver Availability: 24/7 Family Dynamics: Close knit family who are there for one another, they have a good church family who visit and send their prayers. Pt is one who is very independent and wants to take care of himself, he was even with chemo and rad treatments.   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    Abuse/Neglect Abuse/Neglect Assessment Can Be Completed: Yes Physical Abuse: Denies Verbal  Abuse: Denies Sexual Abuse: Denies Exploitation of patient/patient's resources: Denies Self-Neglect: Denies   Emotional Status Pt's affect, behavior adn adjustment status: Pt is motivated to do well and will do anything the therapy team asks of him to make improvements. His wife is here and trying to help but needs to let the therapists do their evaluations. She is willing to help while here just like being at home Recent Psychosocial Issues: other health issues had just finished his rad and chemo treatments for his lung cancer prior to coming into the hospital Pyschiatric History: No history deferred depression screen due to doing well and still adjusting to the new unit and program. He is coping appropriately and is talking about his feelings. Will monitor and have neruo-psych see while here if would be beneficial Substance Abuse History: No issues-quit tobacco years ago   Patient / Family Perceptions, Expectations & Goals Pt/Family understanding of illness & functional limitations: Pt and wife can explain his stroke and deficits. They both have spoken with the MD and feel thier questions and concerns have been addressed. Wife plans to be here daily and ask the MD her questions that come up. She is not one to shy away from finding out the answers she seeks. 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, father, grandfather, retiree, church deacon, friend, etc Anticipated changes in roles/activities/participation: resume Pt/family expectations/goals: Pt states: " I want to be able to take care of myself before I leave here."  Wife states: " I hope he can get back to what he was doing before this, I need  him too."   [employer]: Other (Comment)(Cancer [facility]) Premorbid Home Care/DME Agencies: None Transportation available at discharge: Wife Resource referrals recommended: Support group (specify)   Discharge Planning Living Arrangements: Spouse/significant other Support Systems: Spouse/significant other, Children, Other relatives, Friends/neighbors, Social worker community Type of Residence: Private residence Insurance Resources: [employer] Financial Resources: Radio broadcast assistant Screen Referred: No Living Expenses: Education officer, community Management: Patient, Spouse Does the patient have any problems obtaining your medications?: No Home Management: Wife Patient/Family Preliminary Plans: Return home with wife who is willing to assist and maybe do too much for him at the expense of her own health issues. Glad she will be here daily and see him in therpaies so she will see what he can do for himself and what he will need assist with. Will await therapy team evaluations and work on discharge needs. Social Work Anticipated Follow Up Needs: Support Group, HH/OP   Clinical Impression Pleasant gentleman who is willing to push himself in therapies to achieve his goals of mod/i-supervision level. His wife is a strong support and tries to do too much for him, but feels she needs to do something while here. Will await therapy evaluations and work on a safe plan for him.WIll monitor to see if neuro-psych needed.    Elease Hashimoto 05/12/2017, 2:55 PM               Edwin Jones, Edwin Rhyme, LCSW  Social Worker  Physical Medicine and Rehabilitation  Progress Notes  Signed  Date of Service:  05/12/2017  2:55 PM       Related encounter: Admission (Discharged) from 05/11/2017 in [place]          _0 Hide copied text  _1 Hover for details   Social Work  Social Work Assessment and Plan   Patient Details  Name:  Edwin Jones MRN: [id number] Date  of Birth: 08/30/1940   Today's Date: 05/12/2017   Problem List:      Patient Active Problem List    Diagnosis Date Noted  . CKD (chronic kidney disease) 05/11/2017  . Infarction of left basal ganglia ([place]) 05/11/2017  . Idiopathic hypotension 05/09/2017  . Stroke ([place]) 05/06/2017  . Stroke (cerebrum) ([place]) 05/06/2017  . Acute ischemic left MCA stroke ([place]) 05/06/2017  . Cancer of upper lobe of left lung ([place]) 10/30/2016  . Pure hypercholesterolemia 06/22/2014  . GERD without esophagitis 06/22/2014  . Benign essential hypertension 06/22/2014  . Diabetes mellitus without complication ([place]) 03/47/4259    Past Medical History:      Past Medical History:  Diagnosis Date  . Cancer ([place])    . COPD (chronic obstructive pulmonary disease) ([place])    . Diabetes mellitus without complication ([place])    . GERD without esophagitis    . Hepatitis C virus      history of hep C treated with Harvoni  . Hypercholesterolemia    . Hypertension    . Seasonal allergies      Past Surgical History:       Past Surgical History:  Procedure Laterality Date  . COLONOSCOPY        approximately 11 years ago  . FLEXIBLE BRONCHOSCOPY N/A 11/05/2016    Procedure: FLEXIBLE BRONCHOSCOPY;  Surgeon: Wilhelmina Mcardle, MD;  Location: [facility] ORS;  Service: Pulmonary;  Laterality: N/A;  . IR FLUORO GUIDE CV LINE RIGHT   05/06/2017  . IR INTRAVSC STENT CERV CAROTID W/O EMB-PROT MOD SED INC ANGIO   05/06/2017  . IR PERCUTANEOUS ART THROMBECTOMY/INFUSION INTRACRANIAL INC DIAG ANGIO   05/06/2017  . IR US GUIDE VASC ACCESS LEFT   05/06/2017  . IR US GUIDE VASC ACCESS RIGHT   05/06/2017  . IR US GUIDE VASC ACCESS RIGHT   05/06/2017  . PORTA CATH INSERTION N/A 11/23/2016    Procedure: Glori Luis Cath Insertion;  Surgeon: Algernon Huxley, MD;  Location: [place] CV LAB;  Service: Cardiovascular;  Laterality: N/A;  . RADIOLOGY WITH ANESTHESIA N/A 05/06/2017    Procedure: IR WITH ANESTHESIA;   Surgeon: Radiologist, Medication, MD;  Location: [place];  Service: Radiology;  Laterality: N/A;  . TEE WITHOUT CARDIOVERSION N/A 05/10/2017    Procedure: TRANSESOPHAGEAL ECHOCARDIOGRAM (TEE);  Surgeon: Sanda Klein, MD;  Location: [facility] ENDOSCOPY;  Service: Cardiovascular;  Laterality: N/A;  . TONSILLECTOMY        Social History:  reports that he quit smoking about 13 years ago. His smoking use included cigarettes. He has a 50.00 pack-year smoking history. he has never used smokeless tobacco. He reports that he does not drink alcohol or use drugs.   Family / Support Systems Marital Status: Married Patient Roles: Spouse, Parent, Other (Comment)(church deacon) Spouse/Significant Other: [name] [phone] Children: [name]-son  Other Supports: grandchildren are close and local Anticipated Caregiver: Wife Ability/Limitations of Caregiver: Wife has health issues and needs to be careful-heart issues and SOB at times Caregiver Availability: 24/7 Family Dynamics: Close knit family who are there for one another, they have a good church family who visit and send their prayers. Pt is one who is very independent and wants to take care of himself, he was even with chemo and rad treatments.   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    Abuse/Neglect Abuse/Neglect Assessment Can Be Completed: Yes Physical Abuse: Denies Verbal Abuse: Denies Sexual Abuse: Denies Exploitation of patient/patient's resources: Denies Self-Neglect: Denies   Emotional Status Pt's affect, behavior adn adjustment status: Pt is motivated to do well and will do anything the  therapy team asks of him to make improvements. His wife is here and trying to help but needs to let the therapists do their evaluations. She is willing to help while here just like being at home Recent Psychosocial Issues: other health issues had just finished his rad and chemo treatments for his lung cancer prior to coming into the hospital Pyschiatric History: No history deferred depression screen due to doing well and still adjusting to the new unit and program. He is coping appropriately and is talking about his feelings. Will monitor and have neruo-psych see while here if would be beneficial Substance Abuse History: No issues-quit tobacco years ago   Patient / Family Perceptions, Expectations & Goals Pt/Family understanding of illness & functional limitations: Pt and wife can explain his stroke and deficits. They both have spoken with the MD and feel thier questions and concerns have been addressed. Wife plans to be here daily and ask the MD her questions that come up. She is not one to shy away from finding out the answers she seeks. 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, father, grandfather, retiree, church deacon, friend, etc Anticipated changes in roles/activities/participation: resume Pt/family expectations/goals: Pt states: " I want to be able to take care of myself before I leave here."  Wife states: " I hope he can get back to what he was doing before this, I need him too."   [employer]: Other (Comment)(Cancer [facility]) Premorbid Home Care/DME Agencies: None Transportation available at discharge: Wife Resource referrals recommended: Support group (specify)   Discharge Planning Living Arrangements: Spouse/significant other Support Systems: Spouse/significant other, Children, Other relatives, Friends/neighbors, Social worker community Type of Residence: Private residence Insurance Resources: [employer] Financial Resources: Charity fundraiser Screen Referred: No Living Expenses: Education officer, community Management: Patient, Spouse Does the patient have any problems obtaining your medications?: No Home Management: Wife Patient/Family Preliminary Plans: Return home with wife who is willing to assist and maybe do too much for him at the expense of her own health issues. Glad she will be here daily and see him in therpaies so she will see what he can do for himself and what he will need assist with. Will await therapy team evaluations and work on discharge needs. Social Work Anticipated Follow Up Needs: Support Group, HH/OP   Clinical Impression Pleasant gentleman who is willing to push himself in therapies to achieve his goals of mod/i-supervision level. His wife is a strong support and tries to do too much for him, but feels she needs to do something while here. Will await therapy evaluations and work on a safe plan for him.WIll monitor to see if neuro-psych needed.    Elease Hashimoto 05/12/2017, 2:55 PM               Edwin Jones, Edwin Rhyme, LCSW  Social Worker  Physical Medicine and Rehabilitation  Progress Notes  Signed  Date of Service:  05/12/2017  2:55 PM       Related encounter: Admission (Discharged) from 05/11/2017 in [place]          _0   Hide copied text  _0 Hover for details   Social Work  Social Work Assessment and Plan   Patient Details  Name: Edwin Jones MRN: [id number] Date of Birth: [birth year]   Today's Date: 05/12/2017   Problem List:      Patient Active Problem List    Diagnosis Date Noted  . CKD (chronic kidney disease) 05/11/2017  . Infarction of left basal ganglia ([place]) 05/11/2017  . Idiopathic hypotension 05/09/2017  . Stroke ([place]) 05/06/2017  . Stroke (cerebrum) ([place]) 05/06/2017  . Acute ischemic left MCA stroke ([place]) 05/06/2017  . Cancer of upper lobe of left lung ([place]) 10/30/2016  . Pure hypercholesterolemia 06/22/2014  .  GERD without esophagitis 06/22/2014  . Benign essential hypertension 06/22/2014  . Diabetes mellitus without complication ([place]) 97/67/3419    Past Medical History:      Past Medical History:  Diagnosis Date  . Cancer ([place])    . COPD (chronic obstructive pulmonary disease) ([place])    . Diabetes mellitus without complication ([place])    . GERD without esophagitis    . Hepatitis C virus      history of hep C treated with Harvoni  . Hypercholesterolemia    . Hypertension    . Seasonal allergies      Past Surgical History:       Past Surgical History:  Procedure Laterality Date  . COLONOSCOPY        approximately 11 years ago  . FLEXIBLE BRONCHOSCOPY N/A 11/05/2016    Procedure: FLEXIBLE BRONCHOSCOPY;  Surgeon: Wilhelmina Mcardle, MD;  Location: [facility] ORS;  Service: Pulmonary;  Laterality: N/A;  . IR FLUORO GUIDE CV LINE RIGHT   05/06/2017  . IR INTRAVSC STENT CERV CAROTID W/O EMB-PROT MOD SED INC ANGIO   05/06/2017  . IR PERCUTANEOUS ART THROMBECTOMY/INFUSION INTRACRANIAL INC DIAG ANGIO   05/06/2017  . IR US GUIDE VASC ACCESS LEFT   05/06/2017  . IR US GUIDE VASC ACCESS RIGHT   05/06/2017  . IR US GUIDE VASC ACCESS RIGHT   05/06/2017  . PORTA CATH INSERTION N/A 11/23/2016    Procedure: Glori Luis Cath Insertion;  Surgeon: Algernon Huxley, MD;  Location: [place] CV LAB;  Service: Cardiovascular;  Laterality: N/A;  . RADIOLOGY WITH ANESTHESIA N/A 05/06/2017    Procedure: IR WITH ANESTHESIA;  Surgeon: Radiologist, Medication, MD;  Location: [place];  Service: Radiology;  Laterality: N/A;  . TEE WITHOUT CARDIOVERSION N/A 05/10/2017    Procedure: TRANSESOPHAGEAL ECHOCARDIOGRAM (TEE);  Surgeon: Sanda Klein, MD;  Location: [facility] ENDOSCOPY;  Service: Cardiovascular;  Laterality: N/A;  . TONSILLECTOMY        Social History:  reports that he quit smoking about 13 years ago. His smoking use included cigarettes. He has a 50.00 pack-year smoking history. he has never used smokeless tobacco. He reports that he does  not drink alcohol or use drugs.   Family / Support Systems Marital Status: Married Patient Roles: Spouse, Parent, Other (Comment)(church deacon) Spouse/Significant Other: [name] [phone] Children: [name]-son  Other Supports: grandchildren are close and local Anticipated Caregiver: Wife Ability/Limitations of Caregiver: Wife has health issues and needs to be careful-heart issues and SOB at times Caregiver Availability: 24/7 Family Dynamics: Close knit family who are there for one another, they have a good church family who visit and send their prayers. Pt is one who is very independent and wants to take care of himself, he was even with chemo and rad treatments.   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    Abuse/Neglect Abuse/Neglect Assessment Can Be Completed: Yes Physical Abuse: Denies Verbal Abuse: Denies Sexual Abuse: Denies Exploitation of patient/patient's resources: Denies Self-Neglect: Denies   Emotional Status Pt's affect, behavior adn adjustment status: Pt is motivated to do well and will do anything the therapy team asks of him to make improvements. His wife is here and trying to help but needs to let the therapists do their evaluations. She is willing to help while here just like being at home Recent Psychosocial Issues: other health issues had just finished his rad and chemo treatments for his lung cancer prior to coming into the hospital Pyschiatric History: No history deferred depression screen due to doing well and still adjusting to the new unit and program. He is coping appropriately and is talking about his  feelings. Will monitor and have neruo-psych see while here if would be beneficial Substance Abuse History: No issues-quit tobacco years ago   Patient / Family Perceptions, Expectations & Goals Pt/Family understanding of illness & functional limitations: Pt and wife can explain his stroke and deficits. They both have spoken with the MD and feel thier questions and concerns have been addressed. Wife plans to be here daily and ask the MD her questions that come up. She is not one to shy away from finding out the answers she seeks. 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, father, grandfather, retiree, church deacon, friend, etc Anticipated changes in roles/activities/participation: resume Pt/family expectations/goals: Pt states: " I want to be able to take care of myself before I leave here."  Wife states: " I hope he can get back to what he was doing before this, I need him too."   [employer]: Other (Comment)(Cancer [facility]) Premorbid Home Care/DME Agencies: None Transportation available at discharge: Wife Resource referrals recommended: Support group (specify)   Discharge Planning Living Arrangements: Spouse/significant other Support Systems: Spouse/significant other, Children, Other relatives, Friends/neighbors, Social worker community Type of Residence: Private residence Insurance Resources: [employer] Financial Resources: Radio broadcast assistant Screen Referred: No Living Expenses: Education officer, community Management: Patient, Spouse Does the patient have any problems obtaining your medications?: No Home Management: Wife Patient/Family Preliminary Plans: Return home with wife who is willing to assist and maybe do too much for him at the expense of her own health issues. Glad she will be here daily and see him in therpaies so she will see what he can do for himself and what he will need assist with. Will await therapy team evaluations and work on discharge  needs. Social Work Anticipated Follow Up Needs: Support Group, HH/OP   Clinical Impression Pleasant gentleman who is willing to push himself in therapies to achieve his goals of mod/i-supervision level. His wife is a strong support and tries to do too much for him, but feels she needs to do something while here. Will await therapy evaluations and work on a safe plan for him.WIll monitor to see if neuro-psych needed.    Elease Hashimoto 05/12/2017, 2:55 PM               Edwin Jones, Edwin Rhyme, LCSW  Social Worker  Physical Medicine and Rehabilitation  Progress Notes  Signed  Date of Service:  05/12/2017  2:55 PM  Related encounter: Admission (Discharged) from 05/11/2017 in [place]          _0 Hide copied text  _1 Hover for details   Social Work  Social Work Assessment and Plan   Patient Details  Name: Edwin Jones MRN: [id number] Date of Birth: [birth year]   Today's Date: 05/12/2017   Problem List:      Patient Active Problem List    Diagnosis Date Noted  . CKD (chronic kidney disease) 05/11/2017  . Infarction of left basal ganglia ([place]) 05/11/2017  . Idiopathic hypotension 05/09/2017  . Stroke ([place]) 05/06/2017  . Stroke (cerebrum) ([place]) 05/06/2017  . Acute ischemic left MCA stroke ([place]) 05/06/2017  . Cancer of upper lobe of left lung ([place]) 10/30/2016  . Pure hypercholesterolemia 06/22/2014  . GERD without esophagitis 06/22/2014  . Benign essential hypertension 06/22/2014  . Diabetes mellitus without complication ([place]) 64/33/2951    Past Medical History:      Past Medical History:  Diagnosis Date  . Cancer ([place])    . COPD (chronic obstructive pulmonary disease) ([place])    . Diabetes mellitus without complication ([place])    . GERD without esophagitis    . Hepatitis C virus      history of hep C treated with Harvoni  . Hypercholesterolemia    . Hypertension    . Seasonal allergies      Past Surgical  History:       Past Surgical History:  Procedure Laterality Date  . COLONOSCOPY        approximately 11 years ago  . FLEXIBLE BRONCHOSCOPY N/A 11/05/2016    Procedure: FLEXIBLE BRONCHOSCOPY;  Surgeon: Wilhelmina Mcardle, MD;  Location: [facility] ORS;  Service: Pulmonary;  Laterality: N/A;  . IR FLUORO GUIDE CV LINE RIGHT   05/06/2017  . IR INTRAVSC STENT CERV CAROTID W/O EMB-PROT MOD SED INC ANGIO   05/06/2017  . IR PERCUTANEOUS ART THROMBECTOMY/INFUSION INTRACRANIAL INC DIAG ANGIO   05/06/2017  . IR US GUIDE VASC ACCESS LEFT   05/06/2017  . IR US GUIDE VASC ACCESS RIGHT   05/06/2017  . IR US GUIDE VASC ACCESS RIGHT   05/06/2017  . PORTA CATH INSERTION N/A 11/23/2016    Procedure: Glori Luis Cath Insertion;  Surgeon: Algernon Huxley, MD;  Location: [place] CV LAB;  Service: Cardiovascular;  Laterality: N/A;  . RADIOLOGY WITH ANESTHESIA N/A 05/06/2017    Procedure: IR WITH ANESTHESIA;  Surgeon: Radiologist, Medication, MD;  Location: [place];  Service: Radiology;  Laterality: N/A;  . TEE WITHOUT CARDIOVERSION N/A 05/10/2017    Procedure: TRANSESOPHAGEAL ECHOCARDIOGRAM (TEE);  Surgeon: Sanda Klein, MD;  Location: [facility] ENDOSCOPY;  Service: Cardiovascular;  Laterality: N/A;  . TONSILLECTOMY        Social History:  reports that he quit smoking about 13 years ago. His smoking use included cigarettes. He has a 50.00 pack-year smoking history. he has never used smokeless tobacco. He reports that he does not drink alcohol or use drugs.   Family / Support Systems Marital Status: Married Patient Roles: Spouse, Parent, Other (Comment)(church deacon) Spouse/Significant Other: [name] [phone] Children: [name]-son  Other Supports: grandchildren are close and local Anticipated Caregiver: Wife Ability/Limitations of Caregiver: Wife has health issues and needs to be careful-heart issues and SOB at times Caregiver Availability: 24/7 Family Dynamics: Close knit family who are there for one  another, they have a good church family who visit and send their prayers. Pt is  one who is very independent and wants to take care of himself, he was even with chemo and rad treatments.   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    Abuse/Neglect Abuse/Neglect Assessment Can Be Completed: Yes Physical Abuse: Denies Verbal Abuse: Denies Sexual Abuse: Denies Exploitation of patient/patient's resources: Denies Self-Neglect: Denies   Emotional Status Pt's affect, behavior adn adjustment status: Pt is motivated to do well and will do anything the therapy team asks of him to make improvements. His wife is here and trying to help but needs to let the therapists do their evaluations. She is willing to help while here just like being at home Recent Psychosocial Issues: other health issues had just finished his rad and chemo treatments for his lung cancer prior to coming into the hospital Pyschiatric History: No history deferred depression screen due to doing well and still adjusting to the new unit and program. He is coping appropriately and is talking about his feelings. Will monitor and have neruo-psych see while here if would be beneficial Substance Abuse History: No issues-quit tobacco years ago   Patient / Family Perceptions, Expectations & Goals Pt/Family understanding of illness & functional limitations: Pt and wife can explain his stroke and deficits. They both have spoken with the MD and feel thier questions and concerns have been addressed. Wife plans to be here daily and ask the MD her questions that come up. She is not one to shy away from finding out the answers  she seeks. 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, father, grandfather, retiree, church deacon, friend, etc Anticipated changes in roles/activities/participation: resume Pt/family expectations/goals: Pt states: " I want to be able to take care of myself before I leave here."  Wife states: " I hope he can get back to what he was doing before this, I need him too."   [employer]: Other (Comment)(Cancer [facility]) Premorbid Home Care/DME Agencies: None Transportation available at discharge: Wife Resource referrals recommended: Support group (specify)   Discharge Planning Living Arrangements: Spouse/significant other Support Systems: Spouse/significant other, Children, Other relatives, Friends/neighbors, Social worker community Type of Residence: Private residence Insurance Resources: [employer] Financial Resources: Radio broadcast assistant Screen Referred: No Living Expenses: Education officer, community Management: Patient, Spouse Does the patient have any problems obtaining your medications?: No Home Management: Wife Patient/Family Preliminary Plans: Return home with wife who is willing to assist and maybe do too much for him at the expense of her own health issues. Glad she will be here daily and see him in therpaies so she will see what he can do for himself and what he will need assist with. Will await therapy team evaluations and work on discharge needs. Social Work Anticipated Follow Up Needs: Support Group, HH/OP   Clinical Impression Pleasant gentleman who is willing to push himself in therapies to achieve his goals of mod/i-supervision level. His wife is a strong support and tries to do too much for him, but feels she needs to do something while here. Will await therapy evaluations and work on a safe plan for him.WIll monitor to see if neuro-psych needed.    Elease Hashimoto 05/12/2017, 2:55 PM               Edwin Jones, Wells Guiles  G, LCSW  Social Worker   Physical Medicine and Rehabilitation  Progress Notes  Signed  Date of Service:  05/12/2017  2:55 PM       Related encounter: Admission (Discharged) from 05/11/2017 in [place]          _0 Hide copied text  _1 Hover for details   Social Work  Social Work Assessment and Plan   Patient Details  Name: Edwin Jones MRN: [id number] Date of Birth: 05/12/1940   Today's Date: 05/12/2017   Problem List:      Patient Active Problem List    Diagnosis Date Noted  . CKD (chronic kidney disease) 05/11/2017  . Infarction of left basal ganglia ([place]) 05/11/2017  . Idiopathic hypotension 05/09/2017  . Stroke ([place]) 05/06/2017  . Stroke (cerebrum) ([place]) 05/06/2017  . Acute ischemic left MCA stroke ([place]) 05/06/2017  . Cancer of upper lobe of left lung ([place]) 10/30/2016  . Pure hypercholesterolemia 06/22/2014  . GERD without esophagitis 06/22/2014  . Benign essential hypertension 06/22/2014  . Diabetes mellitus without complication ([place]) 83/15/1761    Past Medical History:      Past Medical History:  Diagnosis Date  . Cancer ([place])    . COPD (chronic obstructive pulmonary disease) ([place])    . Diabetes mellitus without complication ([place])    . GERD without esophagitis    . Hepatitis C virus      history of hep C treated with Harvoni  . Hypercholesterolemia    . Hypertension    . Seasonal allergies      Past Surgical History:       Past Surgical History:  Procedure Laterality Date  . COLONOSCOPY        approximately 11 years ago  . FLEXIBLE BRONCHOSCOPY N/A 11/05/2016    Procedure: FLEXIBLE BRONCHOSCOPY;  Surgeon: Wilhelmina Mcardle, MD;  Location: [facility] ORS;  Service: Pulmonary;  Laterality: N/A;  . IR FLUORO GUIDE CV LINE RIGHT   05/06/2017  . IR INTRAVSC STENT CERV CAROTID W/O EMB-PROT MOD SED INC ANGIO   05/06/2017  . IR PERCUTANEOUS ART THROMBECTOMY/INFUSION INTRACRANIAL INC DIAG ANGIO   05/06/2017  . IR US GUIDE VASC ACCESS LEFT    05/06/2017  . IR US GUIDE VASC ACCESS RIGHT   05/06/2017  . IR US GUIDE VASC ACCESS RIGHT   05/06/2017  . PORTA CATH INSERTION N/A 11/23/2016    Procedure: Glori Luis Cath Insertion;  Surgeon: Algernon Huxley, MD;  Location: [place] CV LAB;  Service: Cardiovascular;  Laterality: N/A;  . RADIOLOGY WITH ANESTHESIA N/A 05/06/2017    Procedure: IR WITH ANESTHESIA;  Surgeon: Radiologist, Medication, MD;  Location: [place];  Service: Radiology;  Laterality: N/A;  . TEE WITHOUT CARDIOVERSION N/A 05/10/2017    Procedure: TRANSESOPHAGEAL ECHOCARDIOGRAM (TEE);  Surgeon: Sanda Klein, MD;  Location[facility] ENDOSCOPY;  Service: Cardiovascular;  Laterality: N/A;  . TONSILLECTOMY        Social History:  reports that he quit smoking about 13 years ago. His smoking use included cigarettes. He has a 50.00 pack-year smoking history. he has never used smokeless tobacco. He reports that he does not drink alcohol or use drugs.   Family / Support Systems Marital Status: Married Patient Roles: Spouse, Parent, Other (Comment)(church deacon) Spouse/Significant Other: [name] [phone] Children: [name]-son  Other Supports: grandchildren are close and local Anticipated Caregiver: Wife Ability/Limitations of Caregiver: Wife has health issues and needs to be careful-heart issues and SOB  at times Caregiver Availability: 24/7 Family Dynamics: Close knit family who are there for one another, they have a good church family who visit and send their prayers. Pt is one who is very independent and wants to take care of himself, he was even with chemo and rad treatments.   Social History Preferred language: English Religion: Baptist Cultural Background: No issues Education: [employer] Read: Yes Write: Yes Employment Status: Retired Freight forwarder Issues: No issues Guardian/Conservator: None-according to MD pt is not fully capable of making decisions at this time will look toward his wife to  make any decisions while here.    Abuse/Neglect Abuse/Neglect Assessment Can Be Completed: Yes Physical Abuse: Denies Verbal Abuse: Denies Sexual Abuse: Denies Exploitation of patient/patient's resources: Denies Self-Neglect: Denies   Emotional Status Pt's affect, behavior adn adjustment status: Pt is motivated to do well and will do anything the therapy team asks of him to make improvements. His wife is here and trying to help but needs to let the therapists do their evaluations. She is willing to help while here just like being at home Recent Psychosocial Issues: other health issues had just finished his rad and chemo treatments for his lung cancer prior to coming into the hospital Pyschiatric History: No history deferred depression screen due to doing well and still adjusting to the new unit and program. He is coping appropriately and is talking about his feelings. Will monitor and have neruo-psych see while here if would be beneficial Substance Abuse History: No issues-quit tobacco years ago   Patient / Family Perceptions, Expectations & Goals Pt/Family understanding of illness & functional limitations: Pt and wife can explain his stroke and deficits. They both have spoken with the MD and feel thier questions and concerns have been addressed. Wife plans to be here daily and ask the MD her questions that come up. She is not one to shy away from finding out the answers she seeks. 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, father, grandfather, retiree, church deacon, friend, etc Anticipated changes in roles/activities/participation: resume Pt/family expectations/goals: Pt states: " I want to be able to take care of myself before I leave here."  Wife states: " I hope he can get back to what he was doing before this, I need him too."   [employer]: Other (Comment)(Cancer [facility]) Premorbid Home Care/DME Agencies: None Transportation available at discharge:  Wife Resource referrals recommended: Support group (specify)   Discharge Planning Living Arrangements: Spouse/significant other Support Systems: Spouse/significant other, Children, Other relatives, Friends/neighbors, Social worker community Type of Residence: Private residence Insurance Resources: [employer] Financial Resources: Radio broadcast assistant Screen Referred: No Living Expenses: Education officer, community Management: Patient, Spouse Does the patient have any problems obtaining your medications?: No Home Management: Wife Patient/Family Preliminary Plans: Return home with wife who is willing to assist and maybe do too much for him at the expense of her own health issues. Glad she will be here daily and see him in therpaies so she will see what he can do for himself and what he will need assist with. Will await therapy team evaluations and work on discharge needs. Social Work Anticipated Follow Up Needs: Support Group, HH/OP   Clinical Impression Pleasant gentleman who is willing to push himself in therapies to achieve his goals of mod/i-supervision level. His wife is a strong support and tries to do too much for him, but feels she needs to do something while here. Will await therapy evaluations and work on a safe plan for him.WIll  monitor to see if neuro-psych needed.    Elease Hashimoto 05/12/2017, 2:55 PM               Edwin Jones, Edwin Rhyme, LCSW  Social Worker  Physical Medicine and Rehabilitation  Patient Care Conference  Signed  Date of Service:  05/12/2017  2:36 PM          Signed          _0 Hide copied text  _1 Hover for details   Inpatient RehabilitationTeam Conference and Plan of Care Update Date: 05/12/2017   Time: 11:20 AM      Patient Name: Antonino Nienhuis      Medical Record Number: [id number]  Date of Birth: 05-26-1940 Sex: Male         Room/Bed: 4W10C/4W10C-01 Payor Info: Payor: MEDICARE / Plan: MEDICARE PART A AND B / Product Type: *No Product type* /      Admitting Diagnosis: STROKE  CVA  Admit Date/Time:  05/11/2017  3:27 PM Admission Comments: No comment available    Primary Diagnosis:  <principal problem not specified> Principal Problem: <principal problem not specified>       Patient Active Problem List    Diagnosis Date Noted  . CKD (chronic kidney disease) 05/11/2017  . Infarction of left basal ganglia ([place]) 05/11/2017  . Idiopathic hypotension 05/09/2017  . Stroke ([place]) 05/06/2017  . Stroke (cerebrum) ([place]) 05/06/2017  . Acute ischemic left MCA stroke ([place]) 05/06/2017  . Cancer of upper lobe of left lung ([place]) 10/30/2016  . Pure hypercholesterolemia 06/22/2014  . GERD without esophagitis 06/22/2014  . Benign essential hypertension 06/22/2014  . Diabetes mellitus without complication ([place]) 78/58/8502      Expected Discharge Date:     Team Members Present: Physician leading conference: Dr. Alysia Penna Social Worker Present: Edwin Jones Kin, LCSW Nurse Present: Rayetta Pigg, RN OT Present: Cherylynn Ridges, OT SLP Present: Weston Anna, SLP PPS Coordinator present : Daiva Nakayama, RN, CRRN       Current Status/Progress Goal Weekly Team Focus  Medical       adjusting meds and checking labs   medical stable     Bowel/Bladder     Incontinent of B/B  Regain continence  Timed toileting   Swallow/Nutrition/ Hydration               ADL's     min A overall with delayed processing, expressive aphasia, memory deficits, R side incoordination  supervision overall  ADL training, balance, functional mobilty, RUE Coordination, cognitive activites, pt/ family education   Mobility     eval pending  eval pending  eval pending   Communication     eval pending  complete eval      Safety/Cognition/ Behavioral Observations   eval pending  complete eval      Pain     Denies Pain  Pain < 2  Assess Qshift and PRN   Skin     Skin intact  Prevent skin breakdown and infection  Assess qshift and PRN     *See Care Plan and progress  notes for long and short-term goals.      Barriers to Discharge   Current Status/Progress Possible Resolutions Date Resolved   Physician                    Nursing                 PT  OT                 SLP            SW              Discharge Planning/Teaching Needs:    Home with wife who can provide supervision level she has health issues of her own. She is here today and participating in his therapies, at times needs to sit down and relax.     Team Discussion:  New evaluation and setting goals in therapies. MD adjusting meds and checking labs.  Revisions to Treatment Plan:  New eval      Elease Hashimoto 05/12/2017, 2:36 PM                 Patient ID: Jaheem Hedgepath, male   DOB: December 19, 1940, 77 y.o.   MRN: [id number]

## 2017-05-26 NOTE — Progress Notes (Signed)
Physical Therapy Session Note  Patient Details  Name: Edwin Jones MRN: [id number] Date of Birth: Aug 11, 1940  Today's Date: 05/26/2017 PT Individual Time: 1415-1530 PT Individual Time Calculation (min): 75 min   Short Term Goals: Week 1:  PT Short Term Goal 1 (Week 1): Pt will transition to EOB with min assist PT Short Term Goal 2 (Week 1): Pt will transfer in and out of wheelchair with mod assist PT Short Term Goal 3 (Week 1): Pt will ambulate 65' with LRAD and mod assist PT Short Term Goal 4 (Week 1): Pt will negotiate 4 steps with 2 rails and max assist  Skilled Therapeutic Interventions/Progress Updates:     No c/o pain.  Session focus on activity tolerance and midline orientation for functional transfers and ambulation.    Pt requires up to max assist, but fairly consistent mod assist, for sit<>stand transfers from a variety of surfaces throughout session with mod multimodal cues for power up, forward weight shift, and coming to full upright posture.  Gait training x35' with RW and R hand splint, with overall mod assist and multimodal cues for weight shift to L for R foot clearance, upright posture, and maintaining walker positioning.  Blocked practice for sit<>stand with mirror for visual feedback, and standing task focus on maintaining weight shift L for midline orientation.  Pt requires multiple rest breaks throughout 2/2 fatigue.  W/C mobility back to room with L hemi technique and occasional verbal cues for attention to obstacles in R visual field.  Pt positioned upright with call bell in reach and QRB in place.   Therapy Documentation Precautions:  Precautions Precautions: Fall Precaution Comments: apraxic, R hemi, R inattention Restrictions Weight Bearing Restrictions: No   See Function Navigator for Current Functional Status.   Therapy/Group: Individual Therapy  Michel Santee 05/26/2017, 4:33 PM

## 2017-05-26 NOTE — Progress Notes (Signed)
Physical Therapy Session Note  Patient Details  Name: Edwin Jones MRN: [id number] Date of Birth: June 22, 1940  Today's Date: 05/26/2017 PT Individual Time: 1537-9432 PT Individual Time Calculation (min): 30 min   Short Term Goals: Week 1:  PT Short Term Goal 1 (Week 1): Pt will transition to EOB with min assist PT Short Term Goal 2 (Week 1): Pt will transfer in and out of wheelchair with mod assist PT Short Term Goal 3 (Week 1): Pt will ambulate 61' with LRAD and mod assist PT Short Term Goal 4 (Week 1): Pt will negotiate 4 steps with 2 rails and max assist  Skilled Therapeutic Interventions/Progress Updates:    Session focused on NMR to address attention, RUE and RLE motor control, postural control, motor planning and apraxia. Engaged in use of Dynavision with PT providing active assisted movement through RUE after pt initiation to hit targets (generally getting 29-32 sec reaction times) and facilitation of functional sitting balance and anterior and lateral weightshifting. W/c mobility with hemi technique to the gym with supervision to min assist (assist needed with turning and obstacle negotiation on L) and then coming back at end of session focused on reciprocal movement pattern re-training and activation of RLE with assist by PT during w/c mobility but pt demonstrating improved activation and initiation with repetition.   Therapy Documentation Precautions:  Precautions Precautions: Fall Precaution Comments: apraxic, R hemi, R inattention Restrictions Weight Bearing Restrictions: No Pain: Does not report pain.    See Function Navigator for Current Functional Status.   Therapy/Group: Individual Therapy  Canary Brim Ivory Broad, PT, DPT  05/26/2017, 11:28 AM

## 2017-05-26 NOTE — Progress Notes (Signed)
Speech Language Pathology Daily Session Note  Patient Details  Name: Edwin Jones MRN: [id number] Date of Birth: [birth year]  Today's Date: 05/26/2017 SLP Individual Time: 5789-7847 SLP Individual Time Calculation (min): 45 min  Short Term Goals: Week 1: SLP Short Term Goal 1 (Week 1): Pt will demonstrate use of call light with Max A cues for 50% of opportunities.  SLP Short Term Goal 2 (Week 1): Pt will select requested object in field of 2 with Max A cues in 8 out of 10 opportunities.  SLP Short Term Goal 3 (Week 1): Pt will name common object in 8 of 10 opportunities with Max A cues.  SLP Short Term Goal 4 (Week 1): Pt will follow 1 step simple directions in 8 out of 10 opportunities with Max A cues.  SLP Short Term Goal 5 (Week 1): Pt will answer basic yes/no question related to himself in 5 out of 10 opportunities with Max A cues.  SLP Short Term Goal 6 (Week 1): Pt will consume dysphagia 2 diet with thin liquids and minimal overt s/s of aspiration and Mod A cues for use of compensatory swallow strategies.   Skilled Therapeutic Interventions: Skilled treatment session focused on communication goals. SLP facilitated session by providing Max A multimodal cues for pt to select requested object in field of two with 38% accuracy, suspect function impacted by apraxia or visual inattention. Pt demonstrated object naming with 100% accuracy and basic yes/no questions with 50% accuracy with Mod A multimodal cues. Pt produced more spontaneous verbal responses this session. Pt left supine in bed with alarm on and all needs within reach. Continue with current plan of care.      Function:  Cognition Comprehension Comprehension assist level: Understands basic 25 - 49% of the time/ requires cueing 50 - 75% of the time  Expression   Expression assist level: Expresses basis less than 25% of the time/requires cueing >75% of the time.  Social Interaction Social Interaction assist level: Interacts  appropriately 25 - 49% of time - Needs frequent redirection.  Problem Solving Problem solving assist level: Solves basic less than 25% of the time - needs direction nearly all the time or does not effectively solve problems and may need a restraint for safety  Memory Memory assist level: Recognizes or recalls less than 25% of the time/requires cueing greater than 75% of the time    Pain Pain Assessment Pain Assessment: No/denies pain   Therapy/Group: Individual Therapy  Meredeth Ide  SLP - Student 05/26/2017, 11:25 AM

## 2017-05-27 ENCOUNTER — Inpatient Hospital Stay (HOSPITAL_COMMUNITY): Payer: Medicare Other | Admitting: Occupational Therapy

## 2017-05-27 ENCOUNTER — Inpatient Hospital Stay (HOSPITAL_COMMUNITY): Payer: Medicare Other | Admitting: Speech Pathology

## 2017-05-27 ENCOUNTER — Inpatient Hospital Stay (HOSPITAL_COMMUNITY): Payer: Medicare Other | Admitting: Physical Therapy

## 2017-05-27 LAB — GLUCOSE, CAPILLARY
GLUCOSE-CAPILLARY: 188 mg/dL — AB (ref 65–99)
GLUCOSE-CAPILLARY: 166 mg/dL — AB (ref 65–99)
Glucose-Capillary: 227 mg/dL — ABNORMAL HIGH (ref 65–99)
Glucose-Capillary: 184 mg/dL — ABNORMAL HIGH (ref 65–99)
Glucose-Capillary: 216 mg/dL — ABNORMAL HIGH (ref 65–99)

## 2017-05-27 MED ORDER — PANTOPRAZOLE SODIUM 40 MG PO TBEC
40.0000 mg | DELAYED_RELEASE_TABLET | Freq: Every day | ORAL | Status: DC
  Administered 2017-05-27 – 2017-06-15 (×20): 40 mg via ORAL
  Filled 2017-05-27 (×20): qty 1

## 2017-05-27 NOTE — Progress Notes (Signed)
Occupational Therapy Session Note  Patient Details  Name: Amaan Meyer MRN: [id number] Date of Birth: Jul 21, 1940  Today's Date: 05/27/2017 OT Individual Time: 5638-7564 OT Individual Time Calculation (min): 60 min    Skilled Therapeutic Interventions/Progress Updates:    1:1 Self care retraining at shower level. Pt perform mod A stand pivot transfer from bed to w/c and min A transfer to [facility] over commode. Pt then able to ambulate short distance into shower with HHA and grab bars. Pt with increased ability to wash more of himself today with decr amt of cues for initiation just follow through. However with increased fatigue to dry off- requiring A. Pt with increased ability to thread clothing- both UB and LB with extra time and once provided proper orientation of clothing and allowed him to do it his own way (dressing left side first). Pt able to perform sit to stands from w/c without pull up on an object with min to mod A with extra time to come into fully erect upright posture with mod cues for right knee extension. Pt still presented with heavy breathing at times but O2 sats remained 99-100%. Pt left sitting up in the w/c with Nt for breakfast.  Therapy Documentation Precautions:  Precautions Precautions: Fall Precaution Comments: apraxic, R hemi, R inattention Restrictions Weight Bearing Restrictions: No Pain:  no c/o pain in session  ADL: ADL ADL Comments: Please see functional navigator  See Function Navigator for Current Functional Status.   Therapy/Group: Individual Therapy  [facility] 05/27/2017, 9:43 AM

## 2017-05-27 NOTE — Progress Notes (Signed)
Speech Language Pathology Daily Session Note  Patient Details  Name: Edwin Jones MRN: [id number] Date of Birth: 1940/04/21  Today's Date: 05/27/2017 SLP Individual Time: 1230-1330 SLP Individual Time Calculation (min): 60 min  Short Term Goals: Week 1: SLP Short Term Goal 1 (Week 1): Pt will demonstrate use of call light with Max A cues for 50% of opportunities.  SLP Short Term Goal 2 (Week 1): Pt will select requested object in field of 2 with Max A cues in 8 out of 10 opportunities.  SLP Short Term Goal 3 (Week 1): Pt will name common object in 8 of 10 opportunities with Max A cues.  SLP Short Term Goal 4 (Week 1): Pt will follow 1 step simple directions in 8 out of 10 opportunities with Max A cues.  SLP Short Term Goal 5 (Week 1): Pt will answer basic yes/no question related to himself in 5 out of 10 opportunities with Max A cues.  SLP Short Term Goal 6 (Week 1): Pt will consume dysphagia 2 diet with thin liquids and minimal overt s/s of aspiration and Mod A cues for use of compensatory swallow strategies.   Skilled Therapeutic Interventions: Skilled treatment session focused on education with wife and pt. SLP made aware that pt's wife was specifically coming in this day to speak with SLP d/t questions about pt's ability to talk. This pt and wife is known to this Probation officer from multiple evaluations during initial acute care stay, initial CIR stay, readmission to acute care and readmission to CIR. Prior to this date, I have reviewed pt's aphasia (both receptive and expressive), cognitive deficits and dysphagia with pt's wife. I written down this information on several occasions.   The following is a list of information that wife discussed with me - there were multiple topics that she covered and was very animated even to the point of smacking her hands:  When is pt going to start talking?  Can't he have something to push that will talk for him? Why can't he do anything? He was so much better  before - meaning after 1st stroke Someone is to blame for this - wife seeking lawyer to investigate answers regarding why pt had 2nd stroke, why he was not evaluated for stroke-like symptoms on Sunday when she first noted the symptoms What is being done in therapy to make him talk? I provided demonstration object selection task, naming task (provide sentence completion task to aid in naming object) to which she stated WHY IS LIKE THIS, HE WASN'T LIKE THIS BEFORE (meaning after 1 st stroke)  I provided education on aphasia, apraxia and cognition, overall wife didn't grasp any of this information because of her animation. I politely excused myself when wife was feverantly praying for wisdom.           Function:    Cognition Comprehension Comprehension assist level: Understands basic 75 - 89% of the time/ requires cueing 10 - 24% of the time  Expression   Expression assist level: Expresses basis less than 25% of the time/requires cueing >75% of the time.  Social Interaction Social Interaction assist level: Interacts appropriately 25 - 49% of time - Needs frequent redirection.  Problem Solving Problem solving assist level: Solves basic less than 25% of the time - needs direction nearly all the time or does not effectively solve problems and may need a restraint for safety  Memory Memory assist level: Recognizes or recalls less than 25% of the time/requires cueing greater than 75% of  the time    Pain Pain Assessment Pain Score: 0-No pain  Therapy/Group: Individual Therapy  Aileena Iglesia 05/27/2017, 2:36 PM

## 2017-05-27 NOTE — Progress Notes (Signed)
Social Work Patient ID: Edwin Jones, male   DOB: [birth year], 77 y.o.   MRN: [id number]  Met with pt and spoke with wife via telephone to discuss team conference goals supervision-min assist level and target discharge 4/9. She had questions regarding durable POA, informed the hospital will assist with Healthcare POA but not durable POA. She was told we would, offered to write a letter to confirm pt being in the hospital. She has many questions regarding care at discharge, options and communication issues. Encouraged her to come in ans attend therapies with pt for speech therapy and this may answer some of her questions. She has been coming at night and taking a break during the day. She is exhausted and this worker encouraged her to stay home a day or two. She voiced pt wants her here to see him. Will continue to work on discharge plans and will see if pt at a level wife can manage at home closer to discharge date.

## 2017-05-27 NOTE — Progress Notes (Signed)
Physical Therapy Session Note  Patient Details  Name: Edwin Jones MRN: [id number] Date of Birth: 15-Jun-1940  Today's Date: 05/27/2017 PT Individual Time: 1015-1130 PT Individual Time Calculation (min): 75 min   Short Term Goals: Week 1:  PT Short Term Goal 1 (Week 1): Pt will transition to EOB with min assist PT Short Term Goal 2 (Week 1): Pt will transfer in and out of wheelchair with mod assist PT Short Term Goal 3 (Week 1): Pt will ambulate 63' with LRAD and mod assist PT Short Term Goal 4 (Week 1): Pt will negotiate 4 steps with 2 rails and max assist  Skilled Therapeutic Interventions/Progress Updates:    no c/o pain.  Session focus on activity tolerance, balance, and functional mobility.    Pt requires min to mod assist for sit<>stand from w/c with RW.  Gait training 2x50' with RW and mod assist for L weight shift and occasional advance of RLE.  Pt requires cues for upright posture throughout.  Extended rest break following ambulation.  Standing balance focus on L weight shift for improved RLE swing through during gait.  Pt completes 4 trials sit<>stand from w/c with min assist and increased time, verbal cues for forward weight shift transition to upright posture.  L weight shift reaching for horseshoes and standing balance without UE support on RW to throw horseshoes.  Pt again requires seated rest break between each trial.  Attempted reciprocal stepping with LEs to propel w/c back to room but pt too fatigued.  Returned to room total assist in w/c.  Positioned upright with QRB in place, call bell in reach and needs met.   Therapy Documentation Precautions:  Precautions Precautions: Fall Precaution Comments: apraxic, R hemi, R inattention Restrictions Weight Bearing Restrictions: No   See Function Navigator for Current Functional Status.   Therapy/Group: Individual Therapy  Michel Santee 05/27/2017, 2:26 PM

## 2017-05-27 NOTE — Progress Notes (Signed)
Subjective/Complaints: No issues overnite per pt although very limited verbal output   ROS: ? Y/N accuracy ?poor sleep  Objective: Vital Signs: Blood pressure 128/73, pulse 82, temperature (!) 97.5 F (36.4 C), temperature source Oral, resp. rate 17, height 6\' 3"  (1.905 m), weight 94 kg (207 lb 3.7 oz), SpO2 98 %. No results found. Results for orders placed or performed during the hospital encounter of 05/20/17 (from the past 72 hour(s))  Glucose, capillary     Status: Abnormal   Collection Time: 05/24/17 12:19 PM  Result Value Ref Range   Glucose-Capillary 221 (H) 65 - 99 mg/dL  Glucose, capillary     Status: Abnormal   Collection Time: 05/24/17  4:24 PM  Result Value Ref Range   Glucose-Capillary 181 (H) 65 - 99 mg/dL  Glucose, capillary     Status: Abnormal   Collection Time: 05/24/17  9:55 PM  Result Value Ref Range   Glucose-Capillary 245 (H) 65 - 99 mg/dL  Glucose, capillary     Status: Abnormal   Collection Time: 05/25/17  6:30 AM  Result Value Ref Range   Glucose-Capillary 208 (H) 65 - 99 mg/dL  Glucose, capillary     Status: Abnormal   Collection Time: 05/25/17 11:42 AM  Result Value Ref Range   Glucose-Capillary 179 (H) 65 - 99 mg/dL  Glucose, capillary     Status: Abnormal   Collection Time: 05/25/17  4:42 PM  Result Value Ref Range   Glucose-Capillary 140 (H) 65 - 99 mg/dL  Glucose, capillary     Status: Abnormal   Collection Time: 05/25/17  9:23 PM  Result Value Ref Range   Glucose-Capillary 224 (H) 65 - 99 mg/dL  Glucose, capillary     Status: Abnormal   Collection Time: 05/26/17  6:22 AM  Result Value Ref Range   Glucose-Capillary 236 (H) 65 - 99 mg/dL  Glucose, capillary     Status: Abnormal   Collection Time: 05/26/17 11:58 AM  Result Value Ref Range   Glucose-Capillary 195 (H) 65 - 99 mg/dL  Glucose, capillary     Status: Abnormal   Collection Time: 05/26/17  4:46 PM  Result Value Ref Range   Glucose-Capillary 245 (H) 65 - 99 mg/dL  Glucose,  capillary     Status: Abnormal   Collection Time: 05/26/17  9:03 PM  Result Value Ref Range   Glucose-Capillary 155 (H) 65 - 99 mg/dL  Glucose, capillary     Status: Abnormal   Collection Time: 05/27/17  6:37 AM  Result Value Ref Range   Glucose-Capillary 227 (H) 65 - 99 mg/dL     HEENT: poor dentition Cardio:RRR without murmur. No JVD  Resp: CTA Bilaterally without wheezes or rales. Normal effort  GI: BS positive and non tender Extremity:  No Edema Skin:   Intact and Other portacath site intact Neuro: lethargic and aphasic.  Abnormal Motor 3/5 RUE, 4/5 RLE , 5/5 on Left side,  Right inattention.  Musc/Skel:  Other no pain with UE or LE ROM Gen NAD   Assessment/Plan: 1. 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 Physiatrist is providing close team supervision and 24 hour management of active medical problems listed below. Physiatrist and rehab team continue to assess barriers to discharge/monitor patient progress toward functional and medical goals. FIM: Function - Bathing Position: Shower Body parts bathed by patient: Right arm, Chest, Abdomen, Left upper leg Body parts  bathed by helper: Front perineal area, Buttocks, Left upper leg, Right lower leg, Left lower leg, Back Assist Level: (moderate assist)  Function- Upper Body Dressing/Undressing What is the patient wearing?: Pull over shirt/dress Pull over shirt/dress - Perfomed by patient: Thread/unthread right sleeve, Thread/unthread left sleeve, Put head through opening, Pull shirt over trunk Pull over shirt/dress - Perfomed by helper: Thread/unthread right sleeve, Thread/unthread left sleeve, Put head through opening, Pull shirt over trunk Assist Level: Touching or steadying assistance(Pt > 75%) Function - Lower Body Dressing/Undressing What is the patient wearing?: Non-skid slipper socks,  Pants Position: Wheelchair/chair at sink Pants- Performed by patient: Thread/unthread left pants leg, Pull pants up/down Pants- Performed by helper: Pull pants up/down, Thread/unthread right pants leg, Thread/unthread left pants leg Non-skid slipper socks- Performed by helper: Don/doff left sock, Don/doff right sock Shoes - Performed by helper: Don/doff right shoe, Don/doff left shoe, Fasten right, Fasten left Assist for footwear: Dependant Assist for lower body dressing: (Total A)  Function - Toileting Toileting activity did not occur: No continent bowel/bladder event Toileting steps completed by helper: Adjust clothing prior to toileting, Adjust clothing after toileting, Performs perineal hygiene Assist level: Touching or steadying assistance (Pt.75%)(Max A)  Function - Air cabin crew transfer activity did not occur: N/A Toilet transfer assistive device: Grab bar, Elevated toilet seat/BSC over toilet Assist level to toilet: Maximal assist (Pt 25 - 49%/lift and lower) Assist level from toilet: Maximal assist (Pt 25 - 49%/lift and lower)  Function - Chair/bed transfer Chair/bed transfer method: Ambulatory Chair/bed transfer assist level: Moderate assist (Pt 50 - 74%/lift or lower) Chair/bed transfer assistive device: Armrests Chair/bed transfer details: Verbal cues for technique, Manual facilitation for weight shifting  Function - Locomotion: Wheelchair Will patient use wheelchair at discharge?: Yes Type: Manual Max wheelchair distance: 2' Assist Level: Touching or steadying assistance (Pt > 75%) Assist Level: Touching or steadying assistance (Pt > 75%) Assist Level: Supervision or verbal cues Turns around,maneuvers to table,bed, and toilet,negotiates 3% grade,maneuvers on rugs and over doorsills: No Function - Locomotion: Ambulation Assistive device: Walker-rolling, Orthosis(hand orthosis) Max distance: 26' Assist level: Moderate assist (Pt 50 - 74%) Assist level:  Moderate assist (Pt 50 - 74%) Walk 50 feet with 2 turns activity did not occur: Safety/medical concerns Walk 150 feet activity did not occur: Safety/medical concerns Walk 10 feet on uneven surfaces activity did not occur: Safety/medical concerns  Function - Comprehension Comprehension: Auditory Comprehension assist level: Understands basic 50 - 74% of the time/ requires cueing 25 - 49% of the time  Function - Expression Expression: Verbal, Nonverbal Expression assist level: Expresses basis less than 25% of the time/requires cueing >75% of the time.  Function - Social Interaction Social Interaction assist level: Interacts appropriately 25 - 49% of time - Needs frequent redirection.  Function - Problem Solving Problem solving assist level: Solves basic less than 25% of the time - needs direction nearly all the time or does not effectively solve problems and may need a restraint for safety  Function - Memory Memory assist level: Recognizes or recalls less than 25% of the time/requires cueing greater than 75% of the time Patient normally able to recall (first 3 days only): That he or she is in a hospital  Medical Problem List and Plan: 1.   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 DVT Prophylaxis/Anticoagulation: Pharmaceutical:Heparin--continue 3. Pain Management:tylenol prn 4. Mood:LCSW to follow for evaluation when appropriate. 5. Neuropsych: This patientis  notcapable of making decisions on hisown behalf. 6. Skin/Wound Care:routine pressure relief measures. 7. Fluids/Electrolytes/Nutrition:Monitor I/O. Continue to offer supplements between meals. 8.Hypertension:Hypotensionhas resolved and patient offMidodrine and Florinef Vitals:   05/26/17 2055 05/27/17 0300  BP: 126/80 128/73  Pulse: 76 82  Resp:  17  Temp:  (!) 97.5 F (36.4 C)  SpO2: 97% 98%   9. 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 at baseline BMP Latest Ref Rng & Units 05/21/2017 05/20/2017 05/19/2017  Glucose 65 - 99 mg/dL 198(H) 171(H) 196(H)  BUN 6 - 20 mg/dL 20 15 16   Creatinine 0.61 - 1.24 mg/dL 1.75(H) 1.87(H) 1.88(H)  Sodium 135 - 145 mmol/L 134(L) 135 135  Potassium 3.5 - 5.1 mmol/L 4.5 4.3 4.5  Chloride 101 - 111 mmol/L 102 104 105  CO2 22 - 32 mmol/L 21(L) 22 21(L)  Calcium 8.9 - 10.3 mg/dL 8.9 8.6(L) 8.7(L)   10. ABLA: Monitor H/H for signs of bleedinghas been in 10- 11 range. 11 on 3/15  12. Lethargy: CVA related Trial Ritalin with some improvement in arousal noted 13. Oral thrush:  nystatin mouthwash as well as oral care every 4 hours while awake. 14. COPD/Left lung cancer: On Breo with albuterol prn.  15.DM: poorly controlled     -SSI   -added low dose amaryl on 3/16, increase to 2mg  on 3/20-monitor, not a good candidate for Metformin due to CKD CBG (last 3)  Recent Labs    05/26/17 1646 05/26/17 2103 05/27/17 0637  GLUCAP 245* 155* 227*    LOS (Days) 7 A FACE TO FACE EVALUATION WAS PERFORMED  Edwin Jones 05/27/2017, 7:30 AM

## 2017-05-28 ENCOUNTER — Inpatient Hospital Stay (HOSPITAL_COMMUNITY): Payer: Medicare Other

## 2017-05-28 ENCOUNTER — Inpatient Hospital Stay (HOSPITAL_COMMUNITY): Payer: Medicare Other | Admitting: Occupational Therapy

## 2017-05-28 ENCOUNTER — Inpatient Hospital Stay (HOSPITAL_COMMUNITY): Payer: Medicare Other | Admitting: Speech Pathology

## 2017-05-28 ENCOUNTER — Inpatient Hospital Stay (HOSPITAL_COMMUNITY): Payer: Medicare Other | Admitting: Physical Therapy

## 2017-05-28 LAB — BASIC METABOLIC PANEL
ANION GAP: 13 (ref 5–15)
BUN: 38 mg/dL — AB (ref 6–20)
CO2: 21 mmol/L — AB (ref 22–32)
Calcium: 9.2 mg/dL (ref 8.9–10.3)
Chloride: 98 mmol/L — ABNORMAL LOW (ref 101–111)
Creatinine, Ser: 1.9 mg/dL — ABNORMAL HIGH (ref 0.61–1.24)
GFR calc Af Amer: 38 mL/min — ABNORMAL LOW (ref >60)
GFR, EST NON AFRICAN AMERICAN: 32 mL/min — AB (ref >60)
GLUCOSE: 205 mg/dL — AB (ref 65–99)
POTASSIUM: 4.8 mmol/L (ref 3.5–5.1)
Sodium: 132 mmol/L — ABNORMAL LOW (ref 135–145)

## 2017-05-28 LAB — GLUCOSE, CAPILLARY
GLUCOSE-CAPILLARY: 214 mg/dL — AB (ref 65–99)
GLUCOSE-CAPILLARY: 171 mg/dL — AB (ref 65–99)
GLUCOSE-CAPILLARY: 161 mg/dL — AB (ref 65–99)
Glucose-Capillary: 209 mg/dL — ABNORMAL HIGH (ref 65–99)
Glucose-Capillary: 244 mg/dL — ABNORMAL HIGH (ref 65–99)

## 2017-05-28 MED ORDER — GLIMEPIRIDE 4 MG PO TABS
4.0000 mg | ORAL_TABLET | Freq: Every day | ORAL | Status: DC
  Administered 2017-05-28 – 2017-06-15 (×19): 4 mg via ORAL
  Filled 2017-05-28 (×19): qty 1

## 2017-05-28 MED ORDER — METHYLPHENIDATE HCL 5 MG PO TABS
10.0000 mg | ORAL_TABLET | Freq: Two times a day (BID) | ORAL | Status: DC
  Administered 2017-05-28 – 2017-06-14 (×35): 10 mg via ORAL
  Filled 2017-05-28 (×36): qty 2

## 2017-05-28 NOTE — Progress Notes (Signed)
Subjective/Complaints: Remains aphasic with lethargy  ROS: ? Y/N accuracy   Objective: Vital Signs: Blood pressure 122/77, pulse 85, temperature 99.6 F (37.6 C), temperature source Oral, resp. rate 16, height 6\' 3"  (1.905 m), weight 94 kg (207 lb 3.7 oz), SpO2 97 %. No results found. Results for orders placed or performed during the hospital encounter of 05/20/17 (from the past 72 hour(s))  Glucose, capillary     Status: Abnormal   Collection Time: 05/25/17 11:42 AM  Result Value Ref Range   Glucose-Capillary 179 (H) 65 - 99 mg/dL  Glucose, capillary     Status: Abnormal   Collection Time: 05/25/17  4:42 PM  Result Value Ref Range   Glucose-Capillary 140 (H) 65 - 99 mg/dL  Glucose, capillary     Status: Abnormal   Collection Time: 05/25/17  9:23 PM  Result Value Ref Range   Glucose-Capillary 224 (H) 65 - 99 mg/dL  Glucose, capillary     Status: Abnormal   Collection Time: 05/26/17  6:22 AM  Result Value Ref Range   Glucose-Capillary 236 (H) 65 - 99 mg/dL  Glucose, capillary     Status: Abnormal   Collection Time: 05/26/17 11:58 AM  Result Value Ref Range   Glucose-Capillary 195 (H) 65 - 99 mg/dL  Glucose, capillary     Status: Abnormal   Collection Time: 05/26/17  4:46 PM  Result Value Ref Range   Glucose-Capillary 245 (H) 65 - 99 mg/dL  Glucose, capillary     Status: Abnormal   Collection Time: 05/26/17  9:03 PM  Result Value Ref Range   Glucose-Capillary 155 (H) 65 - 99 mg/dL  Glucose, capillary     Status: Abnormal   Collection Time: 05/27/17  6:37 AM  Result Value Ref Range   Glucose-Capillary 227 (H) 65 - 99 mg/dL  Glucose, capillary     Status: Abnormal   Collection Time: 05/27/17 11:42 AM  Result Value Ref Range   Glucose-Capillary 188 (H) 65 - 99 mg/dL  Glucose, capillary     Status: Abnormal   Collection Time: 05/27/17  4:58 PM  Result Value Ref Range   Glucose-Capillary 166 (H) 65 - 99 mg/dL  Glucose, capillary     Status: Abnormal   Collection Time:  05/27/17  5:04 PM  Result Value Ref Range   Glucose-Capillary 184 (H) 65 - 99 mg/dL  Glucose, capillary     Status: Abnormal   Collection Time: 05/27/17  8:46 PM  Result Value Ref Range   Glucose-Capillary 216 (H) 65 - 99 mg/dL  Glucose, capillary     Status: Abnormal   Collection Time: 05/28/17  3:54 AM  Result Value Ref Range   Glucose-Capillary 209 (H) 65 - 99 mg/dL   Comment 1 Notify RN   Glucose, capillary     Status: Abnormal   Collection Time: 05/28/17  6:56 AM  Result Value Ref Range   Glucose-Capillary 214 (H) 65 - 99 mg/dL   Comment 1 Notify RN      HEENT: poor dentition Cardio:RRR without murmur. No JVD  Resp: CTA Bilaterally without wheezes or rales. Normal effort  GI: BS positive and non tender Extremity:  No Edema Skin:   Intact and Other portacath site intact Neuro: lethargic and aphasic.  Abnormal Motor 3/5 RUE, 4/5 RLE , 5/5 on Left side,  Right inattention.  Musc/Skel:  Other no pain with UE or LE ROM Gen NAD   Assessment/Plan: 1. Functional deficits secondary to Left Basal ganglia hemorrhagic infarct which  require 3+ hours per day of interdisciplinary therapy in a comprehensive inpatient rehab setting. Physiatrist is providing close team supervision and 24 hour management of active medical problems listed below. Physiatrist and rehab team continue to assess barriers to discharge/monitor patient progress toward functional and medical goals. FIM: Function - Bathing Position: Shower Body parts bathed by patient: Right arm, Chest, Abdomen, Left upper leg, Left arm, Front perineal area, Right upper leg Body parts bathed by helper: Buttocks, Right lower leg, Left lower leg, Back Assist Level: Touching or steadying assistance(Pt > 75%)  Function- Upper Body Dressing/Undressing What is the patient wearing?: Pull over shirt/dress Pull over shirt/dress - Perfomed by patient: Thread/unthread right sleeve, Thread/unthread left sleeve, Put head through opening Pull  over shirt/dress - Perfomed by helper: Pull shirt over trunk Assist Level: Supervision or verbal cues Function - Lower Body Dressing/Undressing What is the patient wearing?: Pants, Underwear, Socks, Shoes Position: Wheelchair/chair at [employer] - Performed by patient: Thread/unthread right underwear leg, Thread/unthread left underwear leg Underwear - Performed by helper: Pull underwear up/down Pants- Performed by patient: Thread/unthread right pants leg, Thread/unthread left pants leg, Pull pants up/down Pants- Performed by helper: Pull pants up/down, Thread/unthread right pants leg, Thread/unthread left pants leg Non-skid slipper socks- Performed by helper: Don/doff right sock, Don/doff left sock Shoes - Performed by patient: Don/doff left shoe Shoes - Performed by helper: Don/doff right shoe, Fasten right, Fasten left Assist for footwear: Partial/moderate assist Assist for lower body dressing: Touching or steadying assistance (Pt > 75%)  Function - Toileting Toileting activity did not occur: No continent bowel/bladder event Toileting steps completed by helper: Adjust clothing prior to toileting, Adjust clothing after toileting, Performs perineal hygiene Assist level: Touching or steadying assistance (Pt.75%)  Function - Air cabin crew transfer activity did not occur: N/A Toilet transfer assistive device: Grab bar, Elevated toilet seat/BSC over toilet Assist level to toilet: Touching or steadying assistance (Pt > 75%) Assist level from toilet: Touching or steadying assistance (Pt > 75%)  Function - Chair/bed transfer Chair/bed transfer method: Ambulatory Chair/bed transfer assist level: Moderate assist (Pt 50 - 74%/lift or lower) Chair/bed transfer assistive device: Armrests Chair/bed transfer details: Verbal cues for technique, Manual facilitation for weight shifting  Function - Locomotion: Wheelchair Will patient use wheelchair at discharge?: Yes Type: Manual Max  wheelchair distance: 73' Assist Level: Touching or steadying assistance (Pt > 75%) Assist Level: Touching or steadying assistance (Pt > 75%) Assist Level: Supervision or verbal cues Turns around,maneuvers to table,bed, and toilet,negotiates 3% grade,maneuvers on rugs and over doorsills: No Function - Locomotion: Ambulation Assistive device: Walker-rolling, Orthosis(hand orthosis) Max distance: 95' Assist level: Moderate assist (Pt 50 - 74%) Assist level: Moderate assist (Pt 50 - 74%) Walk 50 feet with 2 turns activity did not occur: Safety/medical concerns Walk 150 feet activity did not occur: Safety/medical concerns Walk 10 feet on uneven surfaces activity did not occur: Safety/medical concerns  Function - Comprehension Comprehension: Auditory Comprehension assist level: Understands basic 75 - 89% of the time/ requires cueing 10 - 24% of the time  Function - Expression Expression: Verbal, Nonverbal Expression assist level: Expresses basis less than 25% of the time/requires cueing >75% of the time.  Function - Social Interaction Social Interaction assist level: Interacts appropriately 25 - 49% of time - Needs frequent redirection.  Function - Problem Solving Problem solving assist level: Solves basic less than 25% of the time - needs direction nearly all the time or does not effectively solve problems and may need a  restraint for safety  Function - Memory Memory assist level: Recognizes or recalls less than 25% of the time/requires cueing greater than 75% of the time Patient normally able to recall (first 3 days only): That he or she is in a hospital  Medical Problem List and Plan: 1.   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 DVT Prophylaxis/Anticoagulation: Pharmaceutical:Heparin--continue 3. Pain Management:tylenol prn 4. Mood:LCSW to follow for evaluation when appropriate. 5. Neuropsych: This  patientis notcapable of making decisions on hisown behalf. 6. Skin/Wound Care:routine pressure relief measures. 7. Fluids/Electrolytes/Nutrition:Monitor I/O. Continue to offer supplements between meals. 8.Hypertension:Hypotensionhas resolved and patient offMidodrine and Florinef Vitals:   05/27/17 2100 05/28/17 0304  BP: 135/69 122/77  Pulse: 83 85  Resp:  16  Temp:  99.6 F (37.6 C)  SpO2: 98% 97%  Controlled 3/22 9. 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 at baseline BMP Latest Ref Rng & Units 05/21/2017 05/20/2017 05/19/2017  Glucose 65 - 99 mg/dL 198(H) 171(H) 196(H)  BUN 6 - 20 mg/dL 20 15 16   Creatinine 0.61 - 1.24 mg/dL 1.75(H) 1.87(H) 1.88(H)  Sodium 135 - 145 mmol/L 134(L) 135 135  Potassium 3.5 - 5.1 mmol/L 4.5 4.3 4.5  Chloride 101 - 111 mmol/L 102 104 105  CO2 22 - 32 mmol/L 21(L) 22 21(L)  Calcium 8.9 - 10.3 mg/dL 8.9 8.6(L) 8.7(L)   10. ABLA: Monitor H/H for signs of bleedinghas been in 10- 11 range. 11 on 3/15  12. Lethargy: CVA related Trial Ritalin with some improvement in arousal noted, no tachycard will titrate up 13. Oral thrush:  nystatin mouthwash as well as oral care every 4 hours while awake. 14. COPD/Left lung cancer: On Breo with albuterol prn.  15.DM: poorly controlled     -SSI   -added low dose amaryl on 3/16, increase to 4mg  on 3/20-monitor, not a good candidate for Metformin due to CKD CBG (last 3)  Recent Labs    05/27/17 2046 05/28/17 0354 05/28/17 0656  GLUCAP 216* 209* 214*    LOS (Days) 8 A FACE TO FACE EVALUATION WAS PERFORMED  Charlett Blake 05/28/2017, 7:33 AM

## 2017-05-28 NOTE — Progress Notes (Signed)
Occupational Therapy Weekly Progress Note  Patient Details  Name: Edwin Jones MRN: [id number] Date of Birth: 1941-03-04  Beginning of progress report period: May 21, 2017 End of progress report period: May 28, 2017  Today's Date: 05/28/2017  Session 1 OT Individual Time: 1000-1030 OT Individual Time Calculation (min): 30 min   Session 2 OT Individual Time: 1300-1400 OT Individual Time Calculation (min): 60 min    Patient has met 2 of 4 short term goals.  Pt is making progress with OT treatments at this time.  He is able to complete transfers with mod/max A with improved initiation and power up. R UE function is improving to a min/mod A level for bathing and dressing tasks.  Will continue with current OT treatment POC.     Patient continues to demonstrate the following deficits: muscle weakness, impaired timing and sequencing, abnormal tone, unbalanced muscle activation, motor apraxia, ataxia, decreased coordination and decreased motor planning, decreased attention to right and right side neglect, decreased initiation, decreased attention, decreased awareness, decreased problem solving, decreased safety awareness, decreased memory and delayed processing and decreased sitting balance, decreased standing balance, decreased postural control, hemiplegia and decreased balance strategies and therefore will continue to benefit from skilled OT intervention to enhance overall performance with BADL and Reduce care partner burden.  Patient progressing toward long term goals..  Continue plan of care.  OT Short Term Goals Week 1:  OT Short Term Goal 1 (Week 1): Pt will be able to use R hand to bring wash cloth to face with min A. OT Short Term Goal 1 - Progress (Week 1): Met OT Short Term Goal 2 (Week 1): Pt will demonstrate improved processing by donning a shirt in 1-2 minutes (vs 5 minutes). OT Short Term Goal 2 - Progress (Week 1): Met OT Short Term Goal 3 (Week 1): Pt will demonstrate  improved dynamic sitting balance by donning pants over R leg from a seated position with Min A OT Short Term Goal 3 - Progress (Week 1): Progressing toward goal OT Short Term Goal 4 (Week 1): Pt will demonstrate improved activity tolerance to stand at the sink with min A to complete oral care. OT Short Term Goal 4 - Progress (Week 1): Progressing toward goal Week 2:  OT Short Term Goal 1 (Week 2): Pt will demonstrate improved dynamic sitting balance by donning pants over R leg from a seated position with Min A OT Short Term Goal 2 (Week 2): Pt will demonstrate improved activity tolerance to stand at the sink with min A to complete oral care. OT Short Term Goal 3 (Week 2): Pt will complete toilet transfer with consistent Mod A OT Short Term Goal 4 (Week 2): Pt will complete 1/3 toileting steps with min A for balance if needed  Skilled Therapeutic Interventions/Progress Updates:    Session 1 Pt greeted seated in wc and agreeable to OT. B UE coordination with ball toss. Pt needed hand over hand A initially to integrate R UE but improved initiation of R UE with practice. Functional use of R UE and anterior weight shift with reaching for medium size cones. Hand over hand initially, to achieve functional grasp on cones, then pt able to achieve with min cues. Pt stated " How many more?" during session. Pt returned to bed at end of session with mod A to pivot L. Pt left semi-reclined in bed with bed alarm on and needs met.   Session 2  Pt came to sitting EOB with increased time  and min A overall. Pt needed increased time to initiate scoot to get feet all the way on the ground. Stand-pivot transfer to the weaker R side with difficulty initiating power up. OT raised bed and provided lifting assistance and facilitation to achieve hip extension. Pt with severe lateral lean to the R and unable to weight shift onto L leg requiring max A to facilitate pivot. Stand-pivot to arm bike with improved power up, still  needing mod A facilitate full hip extension and mod to pivot safely to chair. Pt completed 5 mins x2 on Sci Fit arm bike level 3  with 1 extended rest break. Pt with difficulty maintaining R hand grasp on handle so thera-band used sustain grasp. Graded peg board activity with guided assist to grasp and release medium sized pegs. Pt returned to room at end of session and left seated in wc with needs met and safety belt on.    Therapy Documentation Precautions:  Precautions Precautions: Fall Precaution Comments: apraxic, R hemi, R inattention Restrictions Weight Bearing Restrictions: No Pain:  none/denies pain ADL: ADL ADL Comments: Please see functional navigator  See Function Navigator for Current Functional Status.   Therapy/Group: Individual Therapy  Valma Cava 05/28/2017, 2:18 PM

## 2017-05-28 NOTE — Progress Notes (Signed)
Speech Language Pathology Weekly Progress and Session Note  Patient Details  Name: Edwin Jones MRN: [id number] Date of Birth: 12/25/1940  Beginning of progress report period: May 21, 2017 End of progress report period: May 28, 2017  Today's Date: 05/28/2017 SLP Individual Time: 0930-1000 SLP Individual Time Calculation (min): 30 min  Short Term Goals: Week 1: SLP Short Term Goal 1 (Week 1): Pt will demonstrate use of call light with Max A cues for 50% of opportunities.  SLP Short Term Goal 1 - Progress (Week 1): Discontinued (comment)(suspect apraxia affects ability) SLP Short Term Goal 2 (Week 1): Pt will select requested object in field of 2 with Max A cues in 8 out of 10 opportunities.  SLP Short Term Goal 2 - Progress (Week 1): Met SLP Short Term Goal 3 (Week 1): Pt will name common object in 8 of 10 opportunities with Max A cues.  SLP Short Term Goal 3 - Progress (Week 1): Met SLP Short Term Goal 4 (Week 1): Pt will follow 1 step simple directions in 8 out of 10 opportunities with Max A cues.  SLP Short Term Goal 4 - Progress (Week 1): Met SLP Short Term Goal 5 (Week 1): Pt will answer basic yes/no question related to himself in 5 out of 10 opportunities with Max A cues.  SLP Short Term Goal 5 - Progress (Week 1): Met SLP Short Term Goal 6 (Week 1): Pt will consume dysphagia 2 diet with thin liquids and minimal overt s/s of aspiration and Mod A cues for use of compensatory swallow strategies.  SLP Short Term Goal 6 - Progress (Week 1): Met    New Short Term Goals: Week 2: SLP Short Term Goal 1 (Week 2): Pt will sustain attention to task for ~ 30 minutes with Mod A cues.  SLP Short Term Goal 2 (Week 2): Pt will select requested object in field of 2 with Mod A cues in 8 out of 10 opportunities.  SLP Short Term Goal 3 (Week 2): Pt will name common object in 8 of 10 opportunities with Mod A cues.  SLP Short Term Goal 4 (Week 2): Pt will follow 1 step simple directions in 8 out  of 10 opportunities with Mod A cues.  SLP Short Term Goal 5 (Week 2): Pt will answer basic yes/no question in 5 out of 10 opportunities with Mod A cues.  SLP Short Term Goal 6 (Week 2): Pt will consume dysphagia 2 diet with thin liquids and minimal overt s/s of aspiration and Min A cues for use of compensatory swallow strategies.   Weekly Progress Updates: Pt has made good progress this reporting period as evidenced by meeting 5 of 6 STGs. Goal targeting call bell use was discontinued d/t possible apraxia inhibiting use. Pt has made progress towards naming objects, selecting objects, following 1 step directions and answering yes/no questions. Although he has made progress he continues to require heavy cues to increase expressive, receptive and cognitive function. 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 Therefore skilled ST is required to target the above deficits, increase functional independence and reduce caregiver burden.      Intensity: Minumum of 1-2 x/day, 30 to 90 minutes Frequency: 3 to 5 out of 7 days Duration/Length of Stay: 06/15/17 Treatment/Interventions: Cueing hierarchy;Functional tasks;Patient/family education;Therapeutic Activities;Speech/Language facilitation;Dysphagia/aspiration precaution training;Multimodal communication approach;Cognitive remediation/compensation   Daily Session  Skilled Therapeutic Interventions: Skilled treatment session focused on communication goals. SLP facilitated session by providing object function  in sentence completion task to achieve 10 out of 10 accuracy with naming object. Given field of 2 objects placed vertically, pt able to "pick up" requested object in 10 out of 10 opportunities. Pt able to match picture to object in field of 2 in 10 of 10 opportunities. Pt was returned to room, left upright in wheelchair with safety belt on and all needs within reach. Continue per current plan of care.      Function:      Cognition Comprehension Comprehension assist level: Understands basic 50 - 74% of the time/ requires cueing 25 - 49% of the time  Expression   Expression assist level: Expresses basis less than 25% of the time/requires cueing >75% of the time.  Social Interaction Social Interaction assist level: Interacts appropriately 25 - 49% of time - Needs frequent redirection.  Problem Solving Problem solving assist level: Solves basic less than 25% of the time - needs direction nearly all the time or does not effectively solve problems and may need a restraint for safety;Solves basic 25 - 49% of the time - needs direction more than half the time to initiate, plan or complete simple activities  Memory Memory assist level: Recognizes or recalls less than 25% of the time/requires cueing greater than 75% of the time   General    Pain    Therapy/Group: Individual Therapy  Edwin Jones 05/28/2017, 3:17 PM

## 2017-05-28 NOTE — Progress Notes (Signed)
Physical Therapy Weekly Progress Note  Patient Details  Name: Edwin Jones MRN: [id number] Date of Birth: 1940/06/01  Beginning of progress report period: May 21, 2017 End of progress report period: May 28, 2017  Today's Date: 05/28/2017 PT Individual Time: 0800-0857 PT Individual Time Calculation (min): 57 min   Patient has met 3 of 4 short term goals. Pt is making steady progress towards LTGs, consistently performing mobility at a mod assist level including gait, transfers, and bed mobility. He continues to require multimodal cues for R inattention during all functional mobility and to facilitate L weight-shifting during functional tasks and targeted interventions. Family has been present intermittently during therapies over past week, will continue to encourage their participation in anticipation of d/c to home.   Patient continues to demonstrate the following deficits muscle weakness, impaired timing and sequencing, unbalanced muscle activation, decreased coordination and decreased motor planning, decreased midline orientation and decreased attention to right, decreased initiation, decreased problem solving, decreased safety awareness and delayed processing and decreased sitting balance, decreased standing balance, decreased postural control, hemiplegia and decreased balance strategies and therefore will continue to benefit from skilled PT intervention to increase functional independence with mobility.  Patient progressing toward long term goals..  Continue plan of care.  PT Short Term Goals Week 1:  PT Short Term Goal 1 (Week 1): Pt will transition to EOB with min assist PT Short Term Goal 1 - Progress (Week 1): Met PT Short Term Goal 2 (Week 1): Pt will transfer in and out of wheelchair with mod assist PT Short Term Goal 2 - Progress (Week 1): Met PT Short Term Goal 3 (Week 1): Pt will ambulate 81' with LRAD and mod assist PT Short Term Goal 3 - Progress (Week 1): Met PT Short  Term Goal 4 (Week 1): Pt will negotiate 4 steps with 2 rails and max assist PT Short Term Goal 4 - Progress (Week 1): Not met Week 2:  PT Short Term Goal 1 (Week 2): Pt will demonstrate equal LE weight distribution during functional tasks 50% of the time.  PT Short Term Goal 2 (Week 2): Pt will negotiate 4 steps w/ 2 rails, max assist PT Short Term Goal 3 (Week 2): Pt will self-propel w/c w/ supervision 150' PT Short Term Goal 4 (Week 2): Pt will transfer bed<>chair w/ min assist  PT Short Term Goal 5 (Week 2): Pt will attend to R environment w/o cues 50% of time during functional mobility  Skilled Therapeutic Interventions/Progress Updates:   Pt in supine and agreeable to therapy, RN present providing pain medication, denies pain but reports increased fatigue today. Transferred to EOB w/ min assist and therapist provided total assist to don LE garments and pants. Transferred to w/c via stand pivot w/ mod assist, manual facilitation of L weight-shifting. Total assist w/c transport to gym. Worked on L weight-shifting while sitting on side of mat w/ emphasis on lateral reaching tasks in both directions to facilitate L weight-bearing. Verbal and visual cues for postural control and increased trunk rotation. Pt w/ increased work of breathing w/ sitting tasks this session, he required frequent rest breaks. Pt self-propelled w/c back to room w/ supervision using L hemi technique and frequent visual and verbal cues for technique. Ended session in w/c, call bell within reach and all needs met. QRB donned.   Therapy Documentation Precautions:  Precautions Precautions: Fall Precaution Comments: apraxic, R hemi, R inattention Restrictions Weight Bearing Restrictions: No  See Function Navigator for Current Functional Status.  Therapy/Group: Individual Therapy  Aidin Doane K Arnette, PT, DPT 05/28/2017, 9:24 AM

## 2017-05-28 NOTE — Progress Notes (Signed)
Physical Therapy Session Note  Patient Details  Name: Edwin Jones MRN: [id number] Date of Birth: 03-02-1941  Today's Date: 05/28/2017 PT Individual Time: 1630-1700 PT Individual Time Calculation (min): 30 min   Short Term Goals: Week 1:  PT Short Term Goal 1 (Week 1): Pt will transition to EOB with min assist PT Short Term Goal 2 (Week 1): Pt will transfer in and out of wheelchair with mod assist PT Short Term Goal 3 (Week 1): Pt will ambulate 41' with LRAD and mod assist PT Short Term Goal 4 (Week 1): Pt will negotiate 4 steps with 2 rails and max assist  Skilled Therapeutic Interventions/Progress Updates:    Pt seated in w/c upon PT arrival, agreeable to therapy tx and denies pain. Pt transported to gym. Pt performed sit<>stand with mod assist and RW, noted to be incontinent of bladder. Pt transported back to room and performed sit<>stand with stedy and min assist, therapist performed clothing management and changed brief total assist. Pt left seated in w/c at end of session with needs in reach and QRB in place.   Therapy Documentation Precautions:  Precautions Precautions: Fall Precaution Comments: apraxic, R hemi, R inattention Restrictions Weight Bearing Restrictions: No   See Function Navigator for Current Functional Status.   Therapy/Group: Individual Therapy  Netta Corrigan, PT, DPT 05/28/2017, 8:03 AM

## 2017-05-29 ENCOUNTER — Inpatient Hospital Stay (HOSPITAL_COMMUNITY): Payer: Medicare Other | Admitting: Occupational Therapy

## 2017-05-29 LAB — GLUCOSE, CAPILLARY
GLUCOSE-CAPILLARY: 174 mg/dL — AB (ref 65–99)
GLUCOSE-CAPILLARY: 203 mg/dL — AB (ref 65–99)
Glucose-Capillary: 211 mg/dL — ABNORMAL HIGH (ref 65–99)
Glucose-Capillary: 160 mg/dL — ABNORMAL HIGH (ref 65–99)

## 2017-05-29 NOTE — Progress Notes (Signed)
Edwin Jones is a 77 y.o. male 1940/07/10 [id number]  Subjective: No new complaints. No new problems.  Objective: Vital signs in last 24 hours: Temp:  [98.8 F (37.1 C)] 98.8 F (37.1 C) (03/23 0614) Pulse Rate:  [82] 82 (03/23 0614) Resp:  [16] 16 (03/23 0614) BP: (137)/(71) 137/71 (03/23 0614) SpO2:  [97 %] 97 % (03/23 0614) Weight change:  Last BM Date: 05/26/17  Intake/Output from previous day: 03/22 0701 - 03/23 0700 In: 720 [P.O.:720] Out: -  Last cbgs: CBG (last 3)  Recent Labs    05/29/17 0641 05/29/17 1149 05/29/17 1629  GLUCAP 211* 160* 174*     Physical Exam General:  in bed in no apparent distress   HEENT: not dry Lungs: Normal effort. Lungs clear to auscultation, no crackles or wheezes. Cardiovascular: Regular rate and rhythm, no edema Abdomen: S/NT/ND; BS(+) Musculoskeletal:  unchanged Neurological: No new neurological deficits Wounds: N/A    Skin: clear  Aging changes Mental state: Alert, aphasic    Lab Results: BMET    Component Value Date/Time   NA 132 (L) 05/28/2017 0556   K 4.8 05/28/2017 0556   CL 98 (L) 05/28/2017 0556   CO2 21 (L) 05/28/2017 0556   GLUCOSE 205 (H) 05/28/2017 0556   BUN 38 (H) 05/28/2017 0556   CREATININE 1.90 (H) 05/28/2017 0556   CALCIUM 9.2 05/28/2017 0556   GFRNONAA 32 (L) 05/28/2017 0556   GFRAA 38 (L) 05/28/2017 0556   CBC    Component Value Date/Time   WBC 5.0 05/21/2017 0431   RBC 3.59 (L) 05/21/2017 0431   HGB 11.0 (L) 05/21/2017 0431   HCT 34.2 (L) 05/21/2017 0431   PLT 310 05/21/2017 0431   MCV 95.3 05/21/2017 0431   MCH 30.6 05/21/2017 0431   MCHC 32.2 05/21/2017 0431   RDW 13.0 05/21/2017 0431   LYMPHSABS 1.0 05/21/2017 0431   MONOABS 0.6 05/21/2017 0431   EOSABS 0.2 05/21/2017 0431   BASOSABS 0.0 05/21/2017 0431    Studies/Results: No results found.  Medications: I have reviewed the patient's current medications.  Assessment/Plan:   1.  Left basal ganglia hemorrhagic infarct.   Right hemiparesis. CIR 2.  DVT prophylaxis with heparin 3.  Pain management with Tylenol as needed 4.  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  Hypotension resolved.  Off Midodrin and Florinef 6.  Anemia.  Monitoring CBC 7.  Lethargy due to CVA.  On Ritalin 8.  Oral thrush.  On nystatin mouthwash 9.  COPD/left lung cancer.  On Brielle with albuterol 10.  Poorly controlled diabetes mellitus.  On Amaryl.  Insulin sliding scale       Length of stay, days: 9  Walker Kehr , MD 05/29/2017, 4:33 PM

## 2017-05-29 NOTE — Progress Notes (Signed)
Occupational Therapy Session Note  Patient Details  Name: Edwin Jones MRN: [id number] Date of Birth: [birth year]  Today's Date: 05/29/2017 OT Individual Time: 2549-8264 OT Individual Time Calculation (min): 60 min   Short Term Goals: Week 2:  OT Short Term Goal 1 (Week 2): Pt will demonstrate improved dynamic sitting balance by donning pants over R leg from a seated position with Min A OT Short Term Goal 2 (Week 2): Pt will demonstrate improved activity tolerance to stand at the sink with min A to complete oral care. OT Short Term Goal 3 (Week 2): Pt will complete toilet transfer with consistent Mod A OT Short Term Goal 4 (Week 2): Pt will complete 1/3 toileting steps with min A for balance if needed  Skilled Therapeutic Interventions/Progress Updates:    Pt greeted semi-reclined in bed and agreeable to OT. Verbally gave pt 2 choices to bathe at the sink or take a shower. Pt unable to communicate with OT out of 2 choices. Wrote both choices on paper and pt needed max multimodal cues to understand to point at choice to communicate want. Pt eventually pointed to sink bath with increased time. Pt transferred OOB with increased time and Mod A. Pt needed mod A progressing to min A to achieve sitting balance as pt with lateral lean to R. Stand-pivot to wc on L side with max A this morning with poor initiation and difficulty motor planning pivot. Multiple sit<>stands at the sink with mod A . Integrated R UE into bathing tasks with min guided A. Pt with facilitation needed to achieve full hip/trunk extension with stand and lateral lean to the R in standing tasks, needed mod A for dynamic balance and poor awareness to correct lateral lean. Utilized mirror feedback to help bring awareness to posture, but did not seem to help today. Pt needed rest breaks within BADL tasks with heavy breathing throughout session and cues to slow down breaths. Pt left seated in wc at end of session with safety belt on and needs  met.   Therapy Documentation Precautions:  Precautions Precautions: Fall Precaution Comments: apraxic, R hemi, R inattention Restrictions Weight Bearing Restrictions: No Pain: Pain Assessment Pain Scale: none/denies pain ADL: ADL ADL Comments: Please see functional navigator  See Function Navigator for Current Functional Status.   Therapy/Group: Individual Therapy  Valma Cava 05/29/2017, 10:26 AM

## 2017-05-30 ENCOUNTER — Inpatient Hospital Stay (HOSPITAL_COMMUNITY): Payer: Medicare Other

## 2017-05-30 LAB — GLUCOSE, CAPILLARY
GLUCOSE-CAPILLARY: 211 mg/dL — AB (ref 65–99)
GLUCOSE-CAPILLARY: 302 mg/dL — AB (ref 65–99)
GLUCOSE-CAPILLARY: 232 mg/dL — AB (ref 65–99)
Glucose-Capillary: 218 mg/dL — ABNORMAL HIGH (ref 65–99)
Glucose-Capillary: 180 mg/dL — ABNORMAL HIGH (ref 65–99)

## 2017-05-30 NOTE — Progress Notes (Signed)
Physical Therapy Session Note  Patient Details  Name: Edwin Jones MRN: [id number] Date of Birth: 05-14-1940  Today's Date: 05/30/2017 PT Individual Time: 6606-3016 PT Individual Time Calculation (min): 72 min   Short Term Goals: Week 2:  PT Short Term Goal 1 (Week 2): Pt will demonstrate equal LE weight distribution during functional tasks 50% of the time.  PT Short Term Goal 2 (Week 2): Pt will negotiate 4 steps w/ 2 rails, max assist PT Short Term Goal 3 (Week 2): Pt will self-propel w/c w/ supervision 150' PT Short Term Goal 4 (Week 2): Pt will transfer bed<>chair w/ min assist  PT Short Term Goal 5 (Week 2): Pt will attend to R environment w/o cues 50% of time during functional mobility  Skilled Therapeutic Interventions/Progress Updates:    Pt supine in bed upon PT arrival, agreeable to therapy tx and denies pain. Pt transferred from supine>sitting EOB with mod assist and verbal/tactile cues for techniques. Pt performed sit<>stand from EOB with mod assist, RW for UE support, total assist to pull pants over hips. Pt performed sit<>stand from bed with RW and mod assist, max encouragement for participation this session. Pt ambulated from bed>hallway x 20 ft with RW and mod assist, verbal and tactile cues for R LE step initiation and increased step length. Pt with increased work of breathing this session and fatigue, vitals BP 140/70, SpO2 100% and HR 103. Pt's wife present and very upset this session stating "something isn't right." Therapist provided family education regarding pts current mobility status and deconditioning. Pt performed sit<>stand from w/c with mod assist, worked on midline static standing with mirror for visual feedback. Pt worked on dynamic standing balance reaching for bean bags on L side to limit pushing to the R, mirror for feedback. Pt's vitals monitored after activity per pt's wifes request: BP 152/106, SpO2 98%, HR 125. Wife continues to demonstrate anxiety, therapist  providing education. The pt reports feeling tired and short of breath at times but otherwise feels fine and would like to sit up in w/c at end of session. Pt left seated in w/c at end of session with QRB in place and wife present. Wife requesting to speak with RN, RN notified.   Therapy Documentation Precautions:  Precautions Precautions: Fall Precaution Comments: apraxic, R hemi, R inattention Restrictions Weight Bearing Restrictions: No   See Function Navigator for Current Functional Status.   Therapy/Group: Individual Therapy  Netta Corrigan, PT, DPT 05/30/2017, 7:55 AM

## 2017-05-30 NOTE — Progress Notes (Signed)
Pt wife very upset during visit initially because of elevated blood sugar 302 at lunch, stating "he should have been off the insulin by now." During 1300 therapy appointment wife was present and came to get RN stating "something's not right, he can't stand up." Raquel Sarna PT was assisting pt to stand with RW, offered standby assistance as pt was able to stand and ambulate with walker into hallway. Therapy session concluded in gym with wife present. At end of session, wife requested therapist that RN take his vital signs and blood sugar again. Blood sugar was 218, BP 138/81, HR 97, SpO2 97% RA, RR 18, T 97.5. Wife stated, "I can't figure out why everything looks so normal, I wish I could figure out what's wrong." Providing education and reassurance only minimally effective.

## 2017-05-30 NOTE — Progress Notes (Signed)
Edwin Jones is a 77 y.o. male [birth year] [id number]  Subjective: No new complaints. No new problems.  Aphasic  Objective: Vital signs in last 24 hours: Temp:  [99.3 F (37.4 C)] 99.3 F (37.4 C) (03/24 0500) Pulse Rate:  [78] 78 (03/24 0500) Resp:  [17] 17 (03/24 0500) BP: (157)/(70) 157/70 (03/24 0500) Weight change:  Last BM Date: 05/29/17  Intake/Output from previous day: 03/23 0701 - 03/24 0700 In: 720 [P.O.:720] Out: -  Last cbgs: CBG (last 3)  Recent Labs    05/29/17 2101 05/30/17 0629 05/30/17 1130  GLUCAP 203* 211* 302*     Physical Exam General: No apparent distress   HEENT: not dry Lungs: Normal effort. Lungs clear to auscultation, no crackles or wheezes. Cardiovascular: Regular rate and rhythm, no edema Abdomen: S/NT/ND; BS(+) Musculoskeletal:  unchanged Neurological: No new neurological deficits Wounds: N/A    Skin: clear  Aging changes Mental state: Alert, aphasic He is in bed    Lab Results: BMET    Component Value Date/Time   NA 132 (L) 05/28/2017 0556   K 4.8 05/28/2017 0556   CL 98 (L) 05/28/2017 0556   CO2 21 (L) 05/28/2017 0556   GLUCOSE 205 (H) 05/28/2017 0556   BUN 38 (H) 05/28/2017 0556   CREATININE 1.90 (H) 05/28/2017 0556   CALCIUM 9.2 05/28/2017 0556   GFRNONAA 32 (L) 05/28/2017 0556   GFRAA 38 (L) 05/28/2017 0556   CBC    Component Value Date/Time   WBC 5.0 05/21/2017 0431   RBC 3.59 (L) 05/21/2017 0431   HGB 11.0 (L) 05/21/2017 0431   HCT 34.2 (L) 05/21/2017 0431   PLT 310 05/21/2017 0431   MCV 95.3 05/21/2017 0431   MCH 30.6 05/21/2017 0431   MCHC 32.2 05/21/2017 0431   RDW 13.0 05/21/2017 0431   LYMPHSABS 1.0 05/21/2017 0431   MONOABS 0.6 05/21/2017 0431   EOSABS 0.2 05/21/2017 0431   BASOSABS 0.0 05/21/2017 0431    Studies/Results: No results found.  Medications: I have reviewed the patient's current medications.  Assessment/Plan:     1.  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  CIR 2.  DVT prophylaxis-heparin 3.  Pain management with Tylenol as needed 4.  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  Hypotension, resolved.  Off Midrin and Florinef 6.  Anemia.  Monitoring hemoglobins 7.  Lethargy due to CVA.  On the Reglan 8.  Oral thrush.  Nystatin mouthwash 9.  COPD/left lung cancer.  Albuterol 10.  Poorly controlled diabetes.  On Amaryl and insulin sliding scale         Length of stay, days: 10  Walker Kehr , MD 05/30/2017, 12:22 PM

## 2017-05-31 ENCOUNTER — Inpatient Hospital Stay (HOSPITAL_COMMUNITY): Payer: Medicare Other

## 2017-05-31 ENCOUNTER — Ambulatory Visit: Payer: Medicare Other | Admitting: Radiation Oncology

## 2017-05-31 ENCOUNTER — Inpatient Hospital Stay (HOSPITAL_COMMUNITY): Payer: Medicare Other | Admitting: Speech Pathology

## 2017-05-31 LAB — GLUCOSE, CAPILLARY
Glucose-Capillary: 197 mg/dL — ABNORMAL HIGH (ref 65–99)
Glucose-Capillary: 187 mg/dL — ABNORMAL HIGH (ref 65–99)
Glucose-Capillary: 216 mg/dL — ABNORMAL HIGH (ref 65–99)
Glucose-Capillary: 320 mg/dL — ABNORMAL HIGH (ref 65–99)

## 2017-05-31 MED ORDER — INSULIN GLARGINE 100 UNIT/ML ~~LOC~~ SOLN
10.0000 [IU] | Freq: Every day | SUBCUTANEOUS | Status: DC
  Administered 2017-05-31: 10 [IU] via SUBCUTANEOUS
  Filled 2017-05-31: qty 0.1

## 2017-05-31 NOTE — Progress Notes (Signed)
Subjective/Complaints: Remains aphasic, sleeping but arouses to voice ROS: ? Y/N accuracy   Objective: Vital Signs: Blood pressure (!) 147/84, pulse 96, temperature 98.2 F (36.8 C), temperature source Oral, resp. rate 18, height 6\' 3"  (1.905 m), weight 94 kg (207 lb 3.7 oz), SpO2 96 %. No results found. Results for orders placed or performed during the hospital encounter of 05/20/17 (from the past 72 hour(s))  Glucose, capillary     Status: Abnormal   Collection Time: 05/28/17 11:18 AM  Result Value Ref Range   Glucose-Capillary 244 (H) 65 - 99 mg/dL  Glucose, capillary     Status: Abnormal   Collection Time: 05/28/17  4:30 PM  Result Value Ref Range   Glucose-Capillary 171 (H) 65 - 99 mg/dL  Glucose, capillary     Status: Abnormal   Collection Time: 05/28/17  8:56 PM  Result Value Ref Range   Glucose-Capillary 161 (H) 65 - 99 mg/dL   Comment 1 Notify RN   Glucose, capillary     Status: Abnormal   Collection Time: 05/29/17  6:41 AM  Result Value Ref Range   Glucose-Capillary 211 (H) 65 - 99 mg/dL   Comment 1 Notify RN   Glucose, capillary     Status: Abnormal   Collection Time: 05/29/17 11:49 AM  Result Value Ref Range   Glucose-Capillary 160 (H) 65 - 99 mg/dL  Glucose, capillary     Status: Abnormal   Collection Time: 05/29/17  4:29 PM  Result Value Ref Range   Glucose-Capillary 174 (H) 65 - 99 mg/dL  Glucose, capillary     Status: Abnormal   Collection Time: 05/29/17  9:01 PM  Result Value Ref Range   Glucose-Capillary 203 (H) 65 - 99 mg/dL  Glucose, capillary     Status: Abnormal   Collection Time: 05/30/17  6:29 AM  Result Value Ref Range   Glucose-Capillary 211 (H) 65 - 99 mg/dL  Glucose, capillary     Status: Abnormal   Collection Time: 05/30/17 11:30 AM  Result Value Ref Range   Glucose-Capillary 302 (H) 65 - 99 mg/dL  Glucose, capillary     Status: Abnormal   Collection Time: 05/30/17  2:15 PM  Result Value Ref Range   Glucose-Capillary 218 (H) 65 - 99  mg/dL  Glucose, capillary     Status: Abnormal   Collection Time: 05/30/17  4:24 PM  Result Value Ref Range   Glucose-Capillary 180 (H) 65 - 99 mg/dL  Glucose, capillary     Status: Abnormal   Collection Time: 05/30/17  9:13 PM  Result Value Ref Range   Glucose-Capillary 232 (H) 65 - 99 mg/dL  Glucose, capillary     Status: Abnormal   Collection Time: 05/31/17  6:10 AM  Result Value Ref Range   Glucose-Capillary 197 (H) 65 - 99 mg/dL     HEENT: poor dentition Cardio:RRR without murmur. No JVD  Resp: CTA Bilaterally without wheezes or rales. Normal effort  GI: BS positive and non tender Extremity:  No Edema Skin:   Intact and Other portacath site intact Neuro: lethargic and aphasic.  Abnormal Motor 3/5 RUE, 4/5 RLE , 5/5 on Left side,  Right inattention.  Musc/Skel:  Other no pain with UE or LE ROM Gen NAD   Assessment/Plan: 1. 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 Physiatrist is providing close team supervision and 24 hour management of active medical problems  listed below. Physiatrist and rehab team continue to assess barriers to discharge/monitor patient progress toward functional and medical goals. FIM: Function - Bathing Position: Wheelchair/chair at sink Body parts bathed by patient: Right arm, Chest, Abdomen, Left upper leg, Left arm, Front perineal area, Right upper leg Body parts bathed by helper: Buttocks, Right lower leg, Left lower leg, Back Assist Level: Touching or steadying assistance(Pt > 75%)  Function- Upper Body Dressing/Undressing What is the patient wearing?: Pull over shirt/dress Pull over shirt/dress - Perfomed by patient: Put head through opening, Pull shirt over trunk, Thread/unthread left sleeve Pull over shirt/dress - Perfomed by helper: Thread/unthread right sleeve Assist Level: Touching or steadying assistance(Pt  > 75%) Function - Lower Body Dressing/Undressing What is the patient wearing?: Pants Position: Wheelchair/chair at sink Underwear - Performed by patient: Thread/unthread right underwear leg, Thread/unthread left underwear leg Underwear - Performed by helper: Pull underwear up/down Pants- Performed by patient: Thread/unthread left pants leg Pants- Performed by helper: Thread/unthread right pants leg, Pull pants up/down Non-skid slipper socks- Performed by helper: Don/doff right sock, Don/doff left sock Shoes - Performed by patient: Don/doff left shoe Shoes - Performed by helper: Don/doff right shoe, Fasten right, Fasten left Assist for footwear: Partial/moderate assist Assist for lower body dressing: Touching or steadying assistance (Pt > 75%)  Function - Toileting Toileting activity did not occur: No continent bowel/bladder event Toileting steps completed by helper: Adjust clothing prior to toileting, Adjust clothing after toileting, Performs perineal hygiene Assist level: Touching or steadying assistance (Pt.75%)  Function - Air cabin crew transfer activity did not occur: N/A Toilet transfer assistive device: Grab bar, Elevated toilet seat/BSC over toilet Assist level to toilet: Touching or steadying assistance (Pt > 75%) Assist level from toilet: Touching or steadying assistance (Pt > 75%)  Function - Chair/bed transfer Chair/bed transfer method: Stand pivot Chair/bed transfer assist level: Maximal assist (Pt 25 - 49%/lift and lower) Chair/bed transfer assistive device: Mechanical lift Mechanical lift: Stedy Chair/bed transfer details: Verbal cues for technique, Manual facilitation for weight shifting, Verbal cues for precautions/safety, Manual facilitation for placement, Manual facilitation for weight bearing, Tactile cues for initiation  Function - Locomotion: Wheelchair Will patient use wheelchair at discharge?: Yes Type: Manual Max wheelchair distance: 100' Assist  Level: Supervision or verbal cues Assist Level: Supervision or verbal cues Assist Level: Supervision or verbal cues Turns around,maneuvers to table,bed, and toilet,negotiates 3% grade,maneuvers on rugs and over doorsills: No Function - Locomotion: Ambulation Assistive device: Walker-rolling, Orthosis Max distance: 20 ft Assist level: Moderate assist (Pt 50 - 74%) Assist level: Moderate assist (Pt 50 - 74%) Walk 50 feet with 2 turns activity did not occur: Safety/medical concerns Walk 150 feet activity did not occur: Safety/medical concerns Walk 10 feet on uneven surfaces activity did not occur: Safety/medical concerns  Function - Comprehension Comprehension: Auditory Comprehension assist level: Understands basic 50 - 74% of the time/ requires cueing 25 - 49% of the time  Function - Expression Expression: Verbal, Nonverbal Expression assist level: Expresses basis less than 25% of the time/requires cueing >75% of the time.  Function - Social Interaction Social Interaction assist level: Interacts appropriately 25 - 49% of time - Needs frequent redirection.  Function - Problem Solving Problem solving assist level: Solves basic less than 25% of the time - needs direction nearly all the time or does not effectively solve problems and may need a restraint for safety, Solves basic 25 - 49% of the time - needs direction more than half the time to initiate, plan  or complete simple activities  Function - Memory Memory assist level: Recognizes or recalls less than 25% of the time/requires cueing greater than 75% of the time Patient normally able to recall (first 3 days only): That he or she is in a hospital, Staff names and faces  Medical Problem List and Plan: 1.   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 DVT Prophylaxis/Anticoagulation: Pharmaceutical:Heparin--continue 3. Pain Management:tylenol prn 4. Mood:LCSW  to follow for evaluation when appropriate. 5. Neuropsych: This patientis notcapable of making decisions on hisown behalf. 6. Skin/Wound Care:routine pressure relief measures. 7. Fluids/Electrolytes/Nutrition:Monitor I/O. Continue to offer supplements between meals. 8.Hypertension:Hypotensionhas resolved and patient offMidodrine and Florinef Vitals:   05/30/17 1430 05/31/17 0325  BP: 138/81 (!) 147/84  Pulse: 97 96  Resp: 18   Temp: (!) 97.5 F (36.4 C) 98.2 F (36.8 C)  SpO2: 98% 96%  Controlled 3/22 9. Acute on chronic ZOX:WRUEAVWU SCr- 1.8-1.9. at baseline BMP Latest Ref Rng & Units 05/28/2017 05/21/2017 05/20/2017  Glucose 65 - 99 mg/dL 205(H) 198(H) 171(H)  BUN 6 - 20 mg/dL 38(H) 20 15  Creatinine 0.61 - 1.24 mg/dL 1.90(H) 1.75(H) 1.87(H)  Sodium 135 - 145 mmol/L 132(L) 134(L) 135  Potassium 3.5 - 5.1 mmol/L 4.8 4.5 4.3  Chloride 101 - 111 mmol/L 98(L) 102 104  CO2 22 - 32 mmol/L 21(L) 21(L) 22  Calcium 8.9 - 10.3 mg/dL 9.2 8.9 8.6(L)   10. ABLA: Monitor H/H for signs of bleedinghas been in 10- 11 range. 11 on 3/15  12. Lethargy: CVA related Trial Ritalin with some improvement in arousal noted, no tachycard will titrate up 13. Oral thrush:  nystatin mouthwash as well as oral care every 4 hours while awake. 14. COPD/Left lung cancer: On Breo with albuterol prn.  15.DM: poorly controlled     -SSI   -added low dose amaryl on 3/16, increase to 4mg  on 3/22-monitor, not a good candidate for Metformin due to CKD CBG (last 3)  Recent Labs    05/30/17 1624 05/30/17 2113 05/31/17 0610  GLUCAP 180* 232* 197*  still elevated start lantus, as we await full effect from amaryl dose change  LOS (Days) 11 A FACE TO FACE EVALUATION WAS PERFORMED  Charlett Blake 05/31/2017, 7:32 AM

## 2017-05-31 NOTE — Progress Notes (Signed)
Physical Therapy Session Note  Patient Details  Name: Edwin Jones MRN: [id number] Date of Birth: 1941/01/13  Today's Date: 05/31/2017 PT Individual Time: 1000-1057 PT Individual Time Calculation (min): 57 min   Short Term Goals: Week 2:  PT Short Term Goal 1 (Week 2): Pt will demonstrate equal LE weight distribution during functional tasks 50% of the time.  PT Short Term Goal 2 (Week 2): Pt will negotiate 4 steps w/ 2 rails, max assist PT Short Term Goal 3 (Week 2): Pt will self-propel w/c w/ supervision 150' PT Short Term Goal 4 (Week 2): Pt will transfer bed<>chair w/ min assist  PT Short Term Goal 5 (Week 2): Pt will attend to R environment w/o cues 50% of time during functional mobility  Skilled Therapeutic Interventions/Progress Updates:    Pt seated in w/c upon PT arrival, agreeable to therapy tx and denies pain. Pt transported from room>gym in w/c total assist. Pt ambulated 2 x 25 ft using rail for L UE support and min assist, manual facilitation for R hamstring activation during swing, visual cues using target for R step length, verbal cues for upright posture. Pt ambulated x 25 ft using RW with R hand orthosis and mod assist, decreased ability to maintain midline when using RW, verbal cues for R step length, tactile cues for R swing initiation. Pt worked on dynamic standing balance and maintaining midline while tossing horseshoes, no UE support with min-mod assist and manual facilitation for R knee extension. Pt transported back to room and left seated in w/c with QRB in place and needs in reach.   Therapy Documentation Precautions:  Precautions Precautions: Fall Precaution Comments: apraxic, R hemi, R inattention Restrictions Weight Bearing Restrictions: No   See Function Navigator for Current Functional Status.   Therapy/Group: Individual Therapy  Netta Corrigan, PT, DPT 05/31/2017, 7:52 AM

## 2017-05-31 NOTE — Progress Notes (Signed)
Speech Language Pathology Daily Session Note  Patient Details  Name: Edwin Jones MRN: [id number] Date of Birth: [birth year]  Today's Date: 05/31/2017 SLP Individual Time: 4627-0350 SLP Individual Time Calculation (min): 45 min  Short Term Goals: Week 2: SLP Short Term Goal 1 (Week 2): Pt will sustain attention to task for ~ 30 minutes with Mod A cues.  SLP Short Term Goal 2 (Week 2): Pt will select requested object in field of 2 with Mod A cues in 8 out of 10 opportunities.  SLP Short Term Goal 3 (Week 2): Pt will name common object in 8 of 10 opportunities with Mod A cues.  SLP Short Term Goal 4 (Week 2): Pt will follow 1 step simple directions in 8 out of 10 opportunities with Mod A cues.  SLP Short Term Goal 5 (Week 2): Pt will answer basic yes/no question in 5 out of 10 opportunities with Mod A cues.  SLP Short Term Goal 6 (Week 2): Pt will consume dysphagia 2 diet with thin liquids and minimal overt s/s of aspiration and Min A cues for use of compensatory swallow strategies.   Skilled Therapeutic Interventions: Skilled treatment session focused on communication goals.Pt able to name objects without any cues for 6 out of 10 items (however items have been known to pt in therapy activities). Pt required increased cues this session to point to items suspect likely d/t to increased apraxic movements. Pt able to answer basic yes/no questions regarding colors, numbers and objects with ~ 65% ability with Mod A cues. Pt was left upright in wheelchair with safety belt in place and all needs within reach. Continue per current plan of care.      Function:    Cognition Comprehension Comprehension assist level: Understands basic 50 - 74% of the time/ requires cueing 25 - 49% of the time  Expression   Expression assist level: Expresses basis less than 25% of the time/requires cueing >75% of the time.  Social Interaction Social Interaction assist level: Interacts appropriately 25 - 49% of time -  Needs frequent redirection.  Problem Solving Problem solving assist level: Solves basic less than 25% of the time - needs direction nearly all the time or does not effectively solve problems and may need a restraint for safety;Solves basic 25 - 49% of the time - needs direction more than half the time to initiate, plan or complete simple activities  Memory Memory assist level: Recognizes or recalls less than 25% of the time/requires cueing greater than 75% of the time    Pain Pain Assessment Pain Score: 0-No pain Faces Pain Scale: No hurt  Therapy/Group: Individual Therapy  Tyrone Balash 05/31/2017, 1:22 PM

## 2017-05-31 NOTE — Progress Notes (Signed)
Occupational Therapy Session Note  Patient Details  Name: Edwin Jones MRN: [id number] Date of Birth: [birth year]  Today's Date: 05/31/2017 OT Individual Time: 0900-1000, Session 2: 13:45-14:15 OT Individual Time Calculation (min): 60 min , Session 2: 30 min   Short Term Goals: Week 2:  OT Short Term Goal 1 (Week 2): Pt will demonstrate improved dynamic sitting balance by donning pants over R leg from a seated position with Min A OT Short Term Goal 2 (Week 2): Pt will demonstrate improved activity tolerance to stand at the sink with min A to complete oral care. OT Short Term Goal 3 (Week 2): Pt will complete toilet transfer with consistent Mod A OT Short Term Goal 4 (Week 2): Pt will complete 1/3 toileting steps with min A for balance if needed  Skilled Therapeutic Interventions/Progress Updates:    Session 1: Pt received supine in bed, agreeable to therapy via nodding yes and reporting no pain. Pt transferred supine to EOB with min A, requiring vc for technique/sequencing. Pt completed UB bathing with min vc for thoroughness. Education and demonstration provided re hemi-dressing technique, allowing pt to trial and error through UB dressing. Pt completed sit to stand transfer with min A, and assisting with standing level peri-hygiene, requiring max A to maintain static standing balance and pushing heavily toward the R side. Pt completed stand pivot transfer to w/c with mod A and heavy vc re technique and UE placement. Pt then ambulated to sink with RW and R orthosis, requiring mod A and vc for lifting of R LE. Vc provided for scanning of environment to locate oral care items on R side. Pt left in w/c with quick release belt donned and all needs met.   Session 2: Pt received in room sitting up in w/c, pt agreeable to therapy with no reports of pain. Pt was brought down to therapy gym to address standing balance, functional activity tolerance, and functional use of R UE. Pt transferred from w/c to  standing with RW with min A. Pt completed functional reaching task in standing, requiring manual cues to facilitate reaching R UE past 50 degrees. Pt fluctuated with static standing balance, requiring CGA-max A to maintain upright posture. Pt at times was pushing heavily toward his R side, especially when removing L UE from RW to reach forward. Extensive vc and tactile cues provided for pt to self-correct posture and to increase BOS distally. Skilled monitoring of vitals following prolonged standing, with all VSS. Pt then brought back to room and requesting to return to bed. Pt completed stand pivot transfer with mod A. Bed alarm set and all needs met.    Therapy Documentation Precautions:  Precautions Precautions: Fall Precaution Comments: apraxic, R hemi, R inattention Restrictions Weight Bearing Restrictions: No  Pain: Pain Assessment Pain Score: 0-No pain Faces Pain Scale: No hurt ADL: ADL ADL Comments: Please see functional navigator  See Function Navigator for Current Functional Status.   Therapy/Group: Individual Therapy  Curtis Sites 05/31/2017, 12:26 PM

## 2017-06-01 ENCOUNTER — Inpatient Hospital Stay (HOSPITAL_COMMUNITY): Payer: Medicare Other

## 2017-06-01 ENCOUNTER — Inpatient Hospital Stay (HOSPITAL_COMMUNITY): Payer: Medicare Other | Admitting: Physical Therapy

## 2017-06-01 ENCOUNTER — Inpatient Hospital Stay (HOSPITAL_COMMUNITY): Payer: Medicare Other | Admitting: Speech Pathology

## 2017-06-01 LAB — GLUCOSE, CAPILLARY
GLUCOSE-CAPILLARY: 246 mg/dL — AB (ref 65–99)
Glucose-Capillary: 255 mg/dL — ABNORMAL HIGH (ref 65–99)
Glucose-Capillary: 179 mg/dL — ABNORMAL HIGH (ref 65–99)
Glucose-Capillary: 267 mg/dL — ABNORMAL HIGH (ref 65–99)

## 2017-06-01 MED ORDER — INSULIN GLARGINE 100 UNIT/ML ~~LOC~~ SOLN
15.0000 [IU] | Freq: Every day | SUBCUTANEOUS | Status: DC
  Administered 2017-06-01: 15 [IU] via SUBCUTANEOUS
  Filled 2017-06-01: qty 0.15

## 2017-06-01 NOTE — Progress Notes (Signed)
Physical Therapy Session Note  Patient Details  Name: Edwin Jones MRN: [id number] Date of Birth: June 13, 1940  Today's Date: 06/01/2017 PT Individual Time: 0800-0900 PT Individual Time Calculation (min): 60 min   Short Term Goals: Week 2:  PT Short Term Goal 1 (Week 2): Pt will demonstrate equal LE weight distribution during functional tasks 50% of the time.  PT Short Term Goal 2 (Week 2): Pt will negotiate 4 steps w/ 2 rails, max assist PT Short Term Goal 3 (Week 2): Pt will self-propel w/c w/ supervision 150' PT Short Term Goal 4 (Week 2): Pt will transfer bed<>chair w/ min assist  PT Short Term Goal 5 (Week 2): Pt will attend to R environment w/o cues 50% of time during functional mobility  Skilled Therapeutic Interventions/Progress Updates:    Pt sitting in bed finishing up breakfast with NT, agreeable to participate in therapy session. Pt reports no pain this AM. Pt is able to don pants in supine with assist to thread legs, pt is able to bridge and pull up pants. Supine to sit with min assist with HOB elevated and use of bedrails. Pt requires max assist to don shirt in sitting due to inability to follow cues and hand-over-hand assist required. Pt is retropulsive in sitting and needs min assist to prevent posterior fall. Squat pivot transfer bed to w/c with mod assist, max verbal and manual cues for transfer technique. Squat pivot transfer w/c to/from mat with mod assist to the L. Pt requires cues for hand placement during transfer and for proper weight shift. Seated crunches from wedge 2 x 5 reps with min assist, verbal cues to decrease use of LUE to assist with return to sitting. Pt left seated in w/c in room with needs in reach.  Therapy Documentation Precautions:  Precautions Precautions: Fall Precaution Comments: apraxic, R hemi, R inattention Restrictions Weight Bearing Restrictions: No  See Function Navigator for Current Functional Status.   Therapy/Group: Individual  Therapy  Excell Seltzer, PT, DPT  06/01/2017, 12:06 PM

## 2017-06-01 NOTE — Progress Notes (Signed)
Subjective/Complaints: Remains aphasic some nodding of Y/N.  ROS: ? Y/N accuracy   Objective: Vital Signs: Blood pressure (!) 144/83, pulse 97, temperature 98.9 F (37.2 C), temperature source Oral, resp. rate 16, height 6\' 3"  (1.905 m), weight 94 kg (207 lb 3.7 oz), SpO2 98 %. No results found. Results for orders placed or performed during the hospital encounter of 05/20/17 (from the past 72 hour(s))  Glucose, capillary     Status: Abnormal   Collection Time: 05/29/17 11:49 AM  Result Value Ref Range   Glucose-Capillary 160 (H) 65 - 99 mg/dL  Glucose, capillary     Status: Abnormal   Collection Time: 05/29/17  4:29 PM  Result Value Ref Range   Glucose-Capillary 174 (H) 65 - 99 mg/dL  Glucose, capillary     Status: Abnormal   Collection Time: 05/29/17  9:01 PM  Result Value Ref Range   Glucose-Capillary 203 (H) 65 - 99 mg/dL  Glucose, capillary     Status: Abnormal   Collection Time: 05/30/17  6:29 AM  Result Value Ref Range   Glucose-Capillary 211 (H) 65 - 99 mg/dL  Glucose, capillary     Status: Abnormal   Collection Time: 05/30/17 11:30 AM  Result Value Ref Range   Glucose-Capillary 302 (H) 65 - 99 mg/dL  Glucose, capillary     Status: Abnormal   Collection Time: 05/30/17  2:15 PM  Result Value Ref Range   Glucose-Capillary 218 (H) 65 - 99 mg/dL  Glucose, capillary     Status: Abnormal   Collection Time: 05/30/17  4:24 PM  Result Value Ref Range   Glucose-Capillary 180 (H) 65 - 99 mg/dL  Glucose, capillary     Status: Abnormal   Collection Time: 05/30/17  9:13 PM  Result Value Ref Range   Glucose-Capillary 232 (H) 65 - 99 mg/dL  Glucose, capillary     Status: Abnormal   Collection Time: 05/31/17  6:10 AM  Result Value Ref Range   Glucose-Capillary 197 (H) 65 - 99 mg/dL  Glucose, capillary     Status: Abnormal   Collection Time: 05/31/17 12:00 PM  Result Value Ref Range   Glucose-Capillary 187 (H) 65 - 99 mg/dL  Glucose, capillary     Status: Abnormal    Collection Time: 05/31/17  4:35 PM  Result Value Ref Range   Glucose-Capillary 216 (H) 65 - 99 mg/dL  Glucose, capillary     Status: Abnormal   Collection Time: 05/31/17  9:12 PM  Result Value Ref Range   Glucose-Capillary 320 (H) 65 - 99 mg/dL  Glucose, capillary     Status: Abnormal   Collection Time: 06/01/17  6:47 AM  Result Value Ref Range   Glucose-Capillary 255 (H) 65 - 99 mg/dL     HEENT: poor dentition Cardio:RRR without murmur. No JVD  Resp: CTA Bilaterally without wheezes or rales. Normal effort  GI: BS positive and non tender Extremity:  No Edema Skin:   Intact and Other portacath site intact Neuro: lethargic and aphasic.  Abnormal Motor 3/5 RUE, 4/5 RLE , 5/5 on Left side,  Right inattention.  Musc/Skel:  Other no pain with UE or LE ROM Gen NAD   Assessment/Plan: 1. 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 Physiatrist is providing close team supervision and 24 hour management of active medical problems listed below. Physiatrist and rehab team continue to assess barriers to discharge/monitor patient progress  toward functional and medical goals. FIM: Function - Bathing Position: Sitting EOB Body parts bathed by patient: Right arm, Left arm, Chest, Abdomen, Front perineal area, Left lower leg, Right lower leg, Left upper leg, Right upper leg Body parts bathed by helper: Buttocks, Back Assist Level: Touching or steadying assistance(Pt > 75%)  Function- Upper Body Dressing/Undressing What is the patient wearing?: Pull over shirt/dress Pull over shirt/dress - Perfomed by patient: Put head through opening, Thread/unthread left sleeve, Thread/unthread right sleeve Pull over shirt/dress - Perfomed by helper: Pull shirt over trunk Assist Level: Touching or steadying assistance(Pt > 75%) Function - Lower Body Dressing/Undressing What is the  patient wearing?: Pants, Underwear Position: Sitting EOB Underwear - Performed by patient: Pull underwear up/down Underwear - Performed by helper: Thread/unthread right underwear leg, Thread/unthread left underwear leg Pants- Performed by patient: Pull pants up/down Pants- Performed by helper: Thread/unthread right pants leg, Thread/unthread left pants leg Non-skid slipper socks- Performed by helper: Don/doff right sock, Don/doff left sock Socks - Performed by helper: Don/doff right sock, Don/doff left sock Shoes - Performed by patient: Don/doff left shoe Shoes - Performed by helper: Don/doff right shoe, Fasten right, Fasten left Assist for footwear: Partial/moderate assist Assist for lower body dressing: Touching or steadying assistance (Pt > 75%)  Function - Toileting Toileting activity did not occur: No continent bowel/bladder event Toileting steps completed by helper: Adjust clothing prior to toileting, Adjust clothing after toileting, Performs perineal hygiene Assist level: Touching or steadying assistance (Pt.75%)  Function - Air cabin crew transfer activity did not occur: N/A Toilet transfer assistive device: Mechanical lift Mechanical lift: Stedy Assist level to toilet: 2 helpers Assist level from toilet: 2 helpers  Function - Chair/bed transfer Chair/bed transfer method: Stand pivot Chair/bed transfer assist level: Moderate assist (Pt 50 - 74%/lift or lower) Chair/bed transfer assistive device: Armrests Mechanical lift: Stedy Chair/bed transfer details: Verbal cues for technique, Manual facilitation for weight shifting, Verbal cues for precautions/safety, Manual facilitation for placement, Manual facilitation for weight bearing, Tactile cues for initiation  Function - Locomotion: Wheelchair Will patient use wheelchair at discharge?: Yes Type: Manual Max wheelchair distance: 100' Assist Level: Supervision or verbal cues Assist Level: Supervision or verbal  cues Assist Level: Supervision or verbal cues Turns around,maneuvers to table,bed, and toilet,negotiates 3% grade,maneuvers on rugs and over doorsills: No Function - Locomotion: Ambulation Assistive device: Walker-rolling, Orthosis Max distance: 10 Assist level: Moderate assist (Pt 50 - 74%) Assist level: Moderate assist (Pt 50 - 74%) Walk 50 feet with 2 turns activity did not occur: Safety/medical concerns Walk 150 feet activity did not occur: Safety/medical concerns Walk 10 feet on uneven surfaces activity did not occur: Safety/medical concerns  Function - Comprehension Comprehension: Auditory Comprehension assist level: Understands basic 50 - 74% of the time/ requires cueing 25 - 49% of the time  Function - Expression Expression: Verbal Expression assist level: Expresses basis less than 25% of the time/requires cueing >75% of the time.  Function - Social Interaction Social Interaction assist level: Interacts appropriately 25 - 49% of time - Needs frequent redirection.  Function - Problem Solving Problem solving assist level: Solves basic less than 25% of the time - needs direction nearly all the time or does not effectively solve problems and may need a restraint for safety  Function - Memory Memory assist level: Recognizes or recalls less than 25% of the time/requires cueing greater than 75% of the time Patient normally able to recall (first 3 days only): That he or she is in  a hospital, Staff names and faces  Medical Problem List and Plan: 1.   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 DVT Prophylaxis/Anticoagulation: Pharmaceutical:Heparin--continue 3. Pain Management:tylenol prn 4. Mood:LCSW to follow for evaluation when appropriate. 5. Neuropsych: This patientis notcapable of making decisions on hisown behalf. 6. Skin/Wound Care:routine pressure relief measures. 7.  Fluids/Electrolytes/Nutrition:Monitor I/O. Continue to offer supplements between meals. 8.Hypertension:Hypotensionhas resolved and patient offMidodrine and Florinef Vitals:   05/31/17 1500 06/01/17 0003  BP: 137/75 (!) 144/83  Pulse: 95 97  Resp: 18 16  Temp: 98.7 F (37.1 C) 98.9 F (37.2 C)  SpO2:  98%  Controlled 3/26 9. Acute on chronic YFR:TMYTRZNB SCr- 1.8-1.9. at baseline BMP Latest Ref Rng & Units 05/28/2017 05/21/2017 05/20/2017  Glucose 65 - 99 mg/dL 205(H) 198(H) 171(H)  BUN 6 - 20 mg/dL 38(H) 20 15  Creatinine 0.61 - 1.24 mg/dL 1.90(H) 1.75(H) 1.87(H)  Sodium 135 - 145 mmol/L 132(L) 134(L) 135  Potassium 3.5 - 5.1 mmol/L 4.8 4.5 4.3  Chloride 101 - 111 mmol/L 98(L) 102 104  CO2 22 - 32 mmol/L 21(L) 21(L) 22  Calcium 8.9 - 10.3 mg/dL 9.2 8.9 8.6(L)   10. ABLA: Monitor H/H for signs of bleedinghas been in 10- 11 range. 11 on 3/15  12. Lethargy: CVA related Trial Ritalin with some improvement in arousal noted, no tachycard will titrate up, discuss with team in am 13. Oral thrush:  nystatin mouthwash as well as oral care every 4 hours while awake. 14. COPD/Left lung cancer: On Breo with albuterol prn.  15.DM: poorly controlled     -SSI   -added low dose amaryl on 3/16, increase to 4mg  on 3/22-monitor, not a good candidate for Metformin due to CKD CBG (last 3)  Recent Labs    05/31/17 1635 05/31/17 2112 06/01/17 0647  GLUCAP 216* 320* 255*  may need to go home on insulin, teach family  LOS (Days) 12 A FACE TO FACE EVALUATION WAS PERFORMED  Edwin Jones 06/01/2017, 7:32 AM

## 2017-06-01 NOTE — Progress Notes (Signed)
Speech Language Pathology Daily Session Note  Patient Details  Name: Edwin Jones MRN: [id number] Date of Birth: 1940/12/12  Today's Date: 06/01/2017 SLP Individual Time: 2863-8177 SLP Individual Time Calculation (min): 30 min  Short Term Goals: Week 2: SLP Short Term Goal 1 (Week 2): Pt will sustain attention to task for ~ 30 minutes with Mod A cues.  SLP Short Term Goal 2 (Week 2): Pt will select requested object in field of 2 with Mod A cues in 8 out of 10 opportunities.  SLP Short Term Goal 3 (Week 2): Pt will name common object in 8 of 10 opportunities with Mod A cues.  SLP Short Term Goal 4 (Week 2): Pt will follow 1 step simple directions in 8 out of 10 opportunities with Mod A cues.  SLP Short Term Goal 5 (Week 2): Pt will answer basic yes/no question in 5 out of 10 opportunities with Mod A cues.  SLP Short Term Goal 6 (Week 2): Pt will consume dysphagia 2 diet with thin liquids and minimal overt s/s of aspiration and Min A cues for use of compensatory swallow strategies.   Skilled Therapeutic Interventions: Skilled treatment session focused on communication goals. SLP facilitated session by providing Min A verbal cues for patient to identify items by their function from a field of 2 with 75% accuracy. Patient also named functional items with 100% accuracy and Mod A sentence completion and phonemic cues. However, patient required extra time and Max A multimodal cues to answer basic questions about functional items at the word level. Patient's wife present and educated in regards to patient's current progress and goals of skilled SLP intervention. She verbalized understanding. Patient left upright in wheelchair with wife present. Continue with current plan of care.      Function:   Cognition Comprehension Comprehension assist level: Understands basic 50 - 74% of the time/ requires cueing 25 - 49% of the time  Expression   Expression assist level: Expresses basis less than 25% of the  time/requires cueing >75% of the time.  Social Interaction Social Interaction assist level: Interacts appropriately 25 - 49% of time - Needs frequent redirection.  Problem Solving Problem solving assist level: Solves basic less than 25% of the time - needs direction nearly all the time or does not effectively solve problems and may need a restraint for safety  Memory Memory assist level: Recognizes or recalls less than 25% of the time/requires cueing greater than 75% of the time    Pain Pain Assessment Pain Score: 0-No pain Faces Pain Scale: No hurt  Therapy/Group: Individual Therapy  Lindsey Hommel 06/01/2017, 4:03 PM

## 2017-06-01 NOTE — Progress Notes (Signed)
Physical Therapy Session Note  Patient Details  Name: Edwin Jones MRN: [id number] Date of Birth: Aug 15, 1940  Today's Date: 06/01/2017 PT Individual Time: 1300-1400 PT Individual Time Calculation (min): 60 min   Short Term Goals: Week 2:  PT Short Term Goal 1 (Week 2): Pt will demonstrate equal LE weight distribution during functional tasks 50% of the time.  PT Short Term Goal 2 (Week 2): Pt will negotiate 4 steps w/ 2 rails, max assist PT Short Term Goal 3 (Week 2): Pt will self-propel w/c w/ supervision 150' PT Short Term Goal 4 (Week 2): Pt will transfer bed<>chair w/ min assist  PT Short Term Goal 5 (Week 2): Pt will attend to R environment w/o cues 50% of time during functional mobility  Skilled Therapeutic Interventions/Progress Updates:    Pt seated in w/c upon PT arrival, agreeable to therapy tx and denies pain. Pt propelled w/c from room>gym x 120 ft using L LE and with supervision and verbal cues for encouragement and techniques. Pt ambulated 2 x 30 ft with L handrail and min assist, verbal cues for techniques and increased R step length. Pt performed stand pivot transfers from chair>w/c after ambulation with mod assist and verbal/tactile cues for techniques. Pt performed x 5 sit<>stands from mat with emphasis on maintaining symmetric weightbearing, min assist. Pt worked on dynamic standing balance while performing UE task, tactile cues for upright posture, verbal cues for maintaining midline, pt requiring min guard for balance. Pt's wife present at end of session, requesting to take pt outside. Therapist transported pt outside, while outside therapist discussed d/c planning with pt and his wife. Pt left seated in w/c in room at end of session with needs in reach and QRB in place.   Therapy Documentation Precautions:  Precautions Precautions: Fall Precaution Comments: apraxic, R hemi, R inattention Restrictions Weight Bearing Restrictions: No   See Function Navigator for  Current Functional Status.   Therapy/Group: Individual Therapy  Netta Corrigan , PT, DPT 06/01/2017, 7:58 AM

## 2017-06-01 NOTE — Progress Notes (Signed)
Occupational Therapy Session Note  Patient Details  Name: Edwin Jones MRN: [id number] Date of Birth: 10-Oct-1940  Today's Date: 06/01/2017 OT Individual Time: 7703-4035 OT Individual Time Calculation (min): 45 min    Short Term Goals: Week 2:  OT Short Term Goal 1 (Week 2): Pt will demonstrate improved dynamic sitting balance by donning pants over R leg from a seated position with Min A OT Short Term Goal 2 (Week 2): Pt will demonstrate improved activity tolerance to stand at the sink with min A to complete oral care. OT Short Term Goal 3 (Week 2): Pt will complete toilet transfer with consistent Mod A OT Short Term Goal 4 (Week 2): Pt will complete 1/3 toileting steps with min A for balance if needed  Skilled Therapeutic Interventions/Progress Updates:    Pt received sitting up in w/c in room. Pt was brought down to ADL suite to address standing tolerance and functional reaching. Pt transferred sit to stand with RW with mod A and then maintained static standing balance with B UE support on RW with CGA. Pt used L UE to reach and open cabinet at eye level, requiring min A to raise R UE to reach cabinet, as well as to position fingers to maintain grasp on cabinet handle. Pt then experienced a BM in standing, indicating he needed to use the bathroom urgently. Pt brought to room and transferred to [facility] over toilet with mod A for stand pivot transfer with RW and heavy tactile cues for UE placement. Pt required increased time for BM to occur. Pt then stood with RW with min A to stabilize, and required max A to complete posterior peri-hygiene and clothing management in standing. Pt completed stand pivot transfer to w/c with mod A. Pt washed hands at sink, with tactile cues provided to encourage bimanual engagement in task. Pt was left in w/c with quick release belt donned and all needs  Met.   Therapy Documentation Precautions:  Precautions Precautions: Fall Precaution Comments: apraxic, R hemi, R  inattention Restrictions Weight Bearing Restrictions: No   Pain: Pain Assessment Pain Scale: 0-10 Pain Score: 0-No pain Faces Pain Scale: No hurt ADL: ADL ADL Comments: Please see functional navigator  See Function Navigator for Current Functional Status.   Therapy/Group: Individual Therapy  Curtis Sites 06/01/2017, 12:12 PM

## 2017-06-02 ENCOUNTER — Inpatient Hospital Stay (HOSPITAL_COMMUNITY): Payer: Medicare Other

## 2017-06-02 ENCOUNTER — Inpatient Hospital Stay (HOSPITAL_COMMUNITY): Payer: Medicare Other | Admitting: Speech Pathology

## 2017-06-02 ENCOUNTER — Inpatient Hospital Stay (HOSPITAL_COMMUNITY): Payer: Medicare Other | Admitting: Occupational Therapy

## 2017-06-02 LAB — GLUCOSE, CAPILLARY
GLUCOSE-CAPILLARY: 181 mg/dL — AB (ref 65–99)
Glucose-Capillary: 204 mg/dL — ABNORMAL HIGH (ref 65–99)
Glucose-Capillary: 230 mg/dL — ABNORMAL HIGH (ref 65–99)
Glucose-Capillary: 220 mg/dL — ABNORMAL HIGH (ref 65–99)

## 2017-06-02 MED ORDER — INSULIN GLARGINE 100 UNIT/ML ~~LOC~~ SOLN
20.0000 [IU] | Freq: Every day | SUBCUTANEOUS | Status: DC
  Administered 2017-06-02 – 2017-06-06 (×5): 20 [IU] via SUBCUTANEOUS
  Filled 2017-06-02 (×5): qty 0.2

## 2017-06-02 NOTE — Progress Notes (Signed)
Occupational Therapy Session Note  Patient Details  Name: Edwin Jones MRN: [id number] Date of Birth: [birth year]  Today's Date: 06/02/2017 OT Individual Time: 0802-0901 Time Calculated:  59 min  Short Term Goals: Week 2:  OT Short Term Goal 1 (Week 2): Pt will demonstrate improved dynamic sitting balance by donning pants over R leg from a seated position with Min A OT Short Term Goal 2 (Week 2): Pt will demonstrate improved activity tolerance to stand at the sink with min A to complete oral care. OT Short Term Goal 3 (Week 2): Pt will complete toilet transfer with consistent Mod A OT Short Term Goal 4 (Week 2): Pt will complete 1/3 toileting steps with min A for balance if needed  Skilled Therapeutic Interventions/Progress Updates:    Pt received in room supine in bed with no c/o pain and agreeable to therapy. Pt required min A manual cues to complete supine to sit EOB transfer. Pt having difficulty this session maintaining EOB balance, requiring tactile and verbal cues to weightshift over L side and to remain upright. Pt completed UB and LB bathing with vc for thoroughness. Pt donned undershirt with mod A, getting "stuck" with the shirt over his head and unable to problem solve through fixing it. Pt then donned polo shirt with set up and requiring increased time. Pt completed sit to stand transfer with min A, requiring mod A to pull up pants and brief in standing. Pt then completed stand pivot transfer to w/c with CGA. Pt propelled w/c to sink level and completed oral hygiene with set up and vc for termination of task/thoroughness. Tactile cues provided for inclusion of R UE during oral care. Pt left sitting up in w/c in bed with quick release belt donned and all needs met.   Therapy Documentation Precautions:  Precautions Precautions: Fall Precaution Comments: apraxic, R hemi, R inattention Restrictions Weight Bearing Restrictions: No   Pain: Pain Assessment Pain Score: 0-No  pain Faces Pain Scale: No hurt ADL: ADL ADL Comments: Please see functional navigator  See Function Navigator for Current Functional Status.   Therapy/Group: Individual Therapy  Curtis Sites 06/02/2017, 12:31 PM

## 2017-06-02 NOTE — Patient Care Conference (Signed)
Inpatient RehabilitationTeam Conference and Plan of Care Update Date: 06/02/2017   Time: 11:20 AM    Patient Name: Edwin Jones      Medical Record Number: [id number]  Date of Birth: 1940/11/11 Sex: Male         Room/Bed: 4W16C/4W16C-01 Payor Info: Payor: MEDICARE / Plan: MEDICARE PART A AND B / Product Type: *No Product type* /    Admitting Diagnosis: Stroke CVA  Admit Date/Time:  05/20/2017  4:30 PM Admission Comments: No comment available   Primary Diagnosis:  <principal problem not specified> Principal Problem: <principal problem not specified>  Patient Active Problem List   Diagnosis Date Noted  . SOB (shortness of breath) on exertion   . Hemiparesis affecting right side as late effect of stroke ([place])   . Aphasia as late effect of stroke   . Small cell lung cancer, left upper lobe ([place])   . Nontraumatic acute hemorrhage of basal ganglia ([place]) 05/17/2017  . Anterior cerebral circulation hemorrhagic infarction 05/17/2017  . Pneumonia   . Cerebral edema ([facility])   . Cough   . Fever, unspecified   . Orthostatic hypotension   . Labile blood pressure   . Acute blood loss anemia   . Diabetes mellitus type 2 in nonobese ([facility])   . Labile blood glucose   . CKD (chronic kidney disease) 05/11/2017  . Infarction of left basal ganglia ([place]) 05/11/2017  . Idiopathic hypotension 05/09/2017  . Stroke ([place]) 05/06/2017  . Stroke (cerebrum) ([place]) 05/06/2017  . Acute ischemic left MCA stroke ([place]) 05/06/2017  . Cancer of upper lobe of left lung ([place]) 10/30/2016  . Pure hypercholesterolemia 06/22/2014  . GERD without esophagitis 06/22/2014  . Benign essential hypertension 06/22/2014  . Diabetes mellitus without complication ([place]) 81/82/9937    Expected Discharge Date: Expected Discharge Date: 06/15/17  Team Members Present: Physician leading conference: Dr. Alysia Penna Social Worker Present: Ovidio Kin, LCSW Nurse Present: Dorthula Nettles, RN PT Present: Michaelene Song,  PT OT Present: Cherylynn Ridges, OT SLP Present: Weston Anna, SLP PPS Coordinator present : Daiva Nakayama, RN, CRRN     Current Status/Progress Goal Weekly Team Focus  Medical   aphasia, dysphagia, dysarthria , R HP, DM  CBG in goal range, avoid aspiration  DM management, family teaching for Lantus administration   Bowel/Bladder   continent  with episodes of bladder incontinence, LBM 06/01/17  less episode of incontinence  timed toileting   Swallow/Nutrition/ Hydration   Dys. 2 textures with thin liquids, Min A  Min A  trials of upgraded textures    ADL's   (S) to mod A for UB ADLs, min A-mod A LB ADLs  min A overall  R UE NMR, ADL retraining, motor planning    Mobility   mod assist bed mobility, mod assist for sit<>stand and stand pivot transfers, min assist gait at rail x 30 ft, mod assist gait with RW 40 ft  supervision, min assist for steps  NMR, balance, functional mobility, cognition   Communication   Max A   Min A   auditory comprehension and verbal expression at the word and phrase level    Safety/Cognition/ Behavioral Observations            Pain   no c/o pain  pain scale <2  continue to assess & treat as needed   Skin   no signs of skin break down  no new areas of skin break down  assess q shift      *See  Care Plan and progress notes for long and short-term goals.     Barriers to Discharge  Current Status/Progress Possible Resolutions Date Resolved   Physician    Medical stability     slow progress, see above  plan D/C in am      Nursing                  PT  Inaccessible home environment;Decreased caregiver support                 OT                  SLP                SW                Discharge Planning/Teaching Needs:  Wife upset with pt's second stroke and the deficits he has. She comes in the evenings but does plan to take him home if she is able      Team Discussion:  Making progress in therapies. R-inattention and needs cueing constantly. More  continent with bladder and timed toileting. flucuates in his levels from hour to hour and time of day. Aphasic, apraxic and r-hemiparesis working on. Wife has been here and attended therapies with him, but still is somewhat unrealistic regarding pt's progress and deficits.  Revisions to Treatment Plan:  DC 4/9    Continued Need for Acute Rehabilitation Level of Care: The patient requires daily medical management by a physician with specialized training in physical medicine and rehabilitation for the following conditions: Daily direction of a multidisciplinary physical rehabilitation program to ensure safe treatment while eliciting the highest outcome that is of practical value to the patient.: Yes Daily medical management of patient stability for increased activity during participation in an intensive rehabilitation regime.: Yes Daily analysis of laboratory values and/or radiology reports with any subsequent need for medication adjustment of medical intervention for : Diabetes problems;Neurological problems  Amandalynn Pitz, [name] 06/02/2017, 1:30 PM

## 2017-06-02 NOTE — Progress Notes (Signed)
Speech Language Pathology Daily Session Note  Patient Details  Name: Edwin Jones MRN: [id number] Date of Birth: 1940/12/12  Today's Date: 06/02/2017  Session 1: SLP Individual Time: 1030-1100 SLP Individual Time Calculation (min): 30 min   Session 2: SLP Individual Time: 4680-3212 SLP Individual Time Calculation (min): 30 min   Short Term Goals: Week 2: SLP Short Term Goal 1 (Week 2): Pt will sustain attention to task for ~ 30 minutes with Mod A cues.  SLP Short Term Goal 2 (Week 2): Pt will select requested object in field of 2 with Mod A cues in 8 out of 10 opportunities.  SLP Short Term Goal 3 (Week 2): Pt will name common object in 8 of 10 opportunities with Mod A cues.  SLP Short Term Goal 4 (Week 2): Pt will follow 1 step simple directions in 8 out of 10 opportunities with Mod A cues.  SLP Short Term Goal 5 (Week 2): Pt will answer basic yes/no question in 5 out of 10 opportunities with Mod A cues.  SLP Short Term Goal 6 (Week 2): Pt will consume dysphagia 2 diet with thin liquids and minimal overt s/s of aspiration and Min A cues for use of compensatory swallow strategies.   Skilled Therapeutic Interventions:  Session 1: Skilled treatment session focused on communication goals. SLP facilitated session by providing Mod A sentence completion and phonemic cues for pt to name functional items with 100% accuracy. 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 Pt required Supervision verbal cues to follow 1-step directions with functional tasks with 90% accuracy. Pt also able to answer basic yes/no questions with 90% accuracy throughout session. Pt left upright in wheelchair with quick release belt on and all needs within reach. Continue with current plan of care.   Session 2: Skilled treatment session focused on communication  goals. SLP facilitated session by providing Supervision verbal cues for pt to decode at the word level with 100% accuracy. Pt required Mod A verbal and tactile cues to select requested object in field of 2 with 100% accuracy and Min A verbal cues to follow 1-step functional directions. Throughout session, pt demonstrated sustained attention to task for ~30 minutes with Min A verbal cues. Pt left upright in wheelchair with quick release belt on and all needs within reach. Continue with current plan of care.    Function:  Cognition Comprehension Comprehension assist level: Understands basic 50 - 74% of the time/ requires cueing 25 - 49% of the time  Expression   Expression assist level: Expresses basis less than 25% of the time/requires cueing >75% of the time.  Social Interaction Social Interaction assist level: Interacts appropriately 25 - 49% of time - Needs frequent redirection.  Problem Solving Problem solving assist level: Solves basic 25 - 49% of the time - needs direction more than half the time to initiate, plan or complete simple activities  Memory Memory assist level: Recognizes or recalls less than 25% of the time/requires cueing greater than 75% of the time    Pain Pain Assessment Pain Score: 0-No pain  Therapy/Group: Individual Therapy  Meredeth Ide  SLP - Student 06/02/2017, 11:57 AM

## 2017-06-02 NOTE — Progress Notes (Signed)
Occupational Therapy Session Note  Patient Details  Name: Edwin Jones MRN: [id number] Date of Birth: 10/21/1940  Today's Date: 06/02/2017 OT Individual Time: 1135-1200 OT Individual Time Calculation (min): 25 min   Short Term Goals: Week 2:  OT Short Term Goal 1 (Week 2): Pt will demonstrate improved dynamic sitting balance by donning pants over R leg from a seated position with Min A OT Short Term Goal 2 (Week 2): Pt will demonstrate improved activity tolerance to stand at the sink with min A to complete oral care. OT Short Term Goal 3 (Week 2): Pt will complete toilet transfer with consistent Mod A OT Short Term Goal 4 (Week 2): Pt will complete 1/3 toileting steps with min A for balance if needed  Skilled Therapeutic Interventions/Progress Updates:    Pt greeted sitting in wc and agreeable to OT. Pt brought to therapy gym and worked on R fine motor control and grip strength using level 3 green faom block and thera-putty. Focus on rolling out putty and functional pinch. Hand over hand A initially to facilitate finger and thumb isolation for pinch. Pt completed 15 power grips with foam block and had pt count out loud with cues for number finding. Pt returned to room and left seated in wc with safety belt on and needs met.  Therapy Documentation Precautions:  Precautions Precautions: Fall Precaution Comments: apraxic, R hemi, R inattention Restrictions Weight Bearing Restrictions: No Pain:  none/denies pain ADL: ADL ADL Comments: Please see functional navigator 2 See Function Navigator for Current Functional Status.   Therapy/Group: Individual Therapy  Valma Cava 06/02/2017, 11:54 AM

## 2017-06-02 NOTE — Progress Notes (Signed)
Subjective/Complaints: Pt with OT, MinA UE dressing , Mod A LE dressing, fluctuates   ROS: ? Y/N accuracy   Objective: Vital Signs: Blood pressure 140/80, pulse 90, temperature 98.1 F (36.7 C), temperature source Oral, resp. rate 16, height _0  (1.905 m), weight 90.2 kg (198 lb 13.7 oz), SpO2 97 %. No results found. Results for orders placed or performed during the hospital encounter of 05/20/17 (from the past 72 hour(s))  Glucose, capillary     Status: Abnormal   Collection Time: 05/30/17 11:30 AM  Result Value Ref Range   Glucose-Capillary 302 (H) 65 - 99 mg/dL  Glucose, capillary     Status: Abnormal   Collection Time: 05/30/17  2:15 PM  Result Value Ref Range   Glucose-Capillary 218 (H) 65 - 99 mg/dL  Glucose, capillary     Status: Abnormal   Collection Time: 05/30/17  4:24 PM  Result Value Ref Range   Glucose-Capillary 180 (H) 65 - 99 mg/dL  Glucose, capillary     Status: Abnormal   Collection Time: 05/30/17  9:13 PM  Result Value Ref Range   Glucose-Capillary 232 (H) 65 - 99 mg/dL  Glucose, capillary     Status: Abnormal   Collection Time: 05/31/17  6:10 AM  Result Value Ref Range   Glucose-Capillary 197 (H) 65 - 99 mg/dL  Glucose, capillary     Status: Abnormal   Collection Time: 05/31/17 12:00 PM  Result Value Ref Range   Glucose-Capillary 187 (H) 65 - 99 mg/dL  Glucose, capillary     Status: Abnormal   Collection Time: 05/31/17  4:35 PM  Result Value Ref Range   Glucose-Capillary 216 (H) 65 - 99 mg/dL  Glucose, capillary     Status: Abnormal   Collection Time: 05/31/17  9:12 PM  Result Value Ref Range   Glucose-Capillary 320 (H) 65 - 99 mg/dL  Glucose, capillary     Status: Abnormal   Collection Time: 06/01/17  6:47 AM  Result Value Ref Range   Glucose-Capillary 255 (H) 65 - 99 mg/dL  Glucose, capillary     Status: Abnormal   Collection Time: 06/01/17 12:07 PM  Result Value Ref Range   Glucose-Capillary 179 (H) 65 - 99 mg/dL  Glucose, capillary      Status: Abnormal   Collection Time: 06/01/17  4:34 PM  Result Value Ref Range   Glucose-Capillary 246 (H) 65 - 99 mg/dL  Glucose, capillary     Status: Abnormal   Collection Time: 06/01/17  9:01 PM  Result Value Ref Range   Glucose-Capillary 267 (H) 65 - 99 mg/dL  Glucose, capillary     Status: Abnormal   Collection Time: 06/02/17  6:45 AM  Result Value Ref Range   Glucose-Capillary 204 (H) 65 - 99 mg/dL     HEENT: poor dentition Cardio:RRR without murmur. No JVD  Resp: CTA Bilaterally without wheezes or rales. Normal effort  GI: BS positive and non tender Extremity:  No Edema Skin:   Intact and Other portacath site intact Neuro: lethargic and aphasic.  Abnormal Motor 3/5 RUE, 4/5 RLE , 5/5 on Left side,  Right inattention.  Musc/Skel:  Other no pain with UE or LE ROM Gen NAD   Assessment/Plan: 1. 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 Physiatrist is providing close team supervision and 24 hour management of active medical problems listed below. Physiatrist and rehab team continue to  assess barriers to discharge/monitor patient progress toward functional and medical goals. FIM: Function - Bathing Position: Sitting EOB Body parts bathed by patient: Right arm, Left arm, Chest, Abdomen, Front perineal area, Left lower leg, Right lower leg, Left upper leg, Right upper leg Body parts bathed by helper: Buttocks, Back Assist Level: Touching or steadying assistance(Pt > 75%)  Function- Upper Body Dressing/Undressing What is the patient wearing?: Pull over shirt/dress Pull over shirt/dress - Perfomed by patient: Put head through opening, Thread/unthread left sleeve, Thread/unthread right sleeve Pull over shirt/dress - Perfomed by helper: Pull shirt over trunk Assist Level: Touching or steadying assistance(Pt > 75%) Function - Lower Body  Dressing/Undressing What is the patient wearing?: Underwear Position: Other (comment)(on toilet) Underwear - Performed by patient: Pull underwear up/down Underwear - Performed by helper: Thread/unthread right underwear leg, Thread/unthread left underwear leg, Pull underwear up/down Pants- Performed by patient: Pull pants up/down Pants- Performed by helper: Thread/unthread right pants leg, Thread/unthread left pants leg Non-skid slipper socks- Performed by helper: Don/doff right sock, Don/doff left sock Socks - Performed by helper: Don/doff right sock, Don/doff left sock Shoes - Performed by patient: Don/doff left shoe Shoes - Performed by helper: Don/doff right shoe, Fasten right, Fasten left Assist for footwear: Partial/moderate assist Assist for lower body dressing: Touching or steadying assistance (Pt > 75%)  Function - Toileting Toileting activity did not occur: No continent bowel/bladder event Toileting steps completed by helper: Adjust clothing prior to toileting, Performs perineal hygiene, Adjust clothing after toileting Toileting Assistive Devices: Grab bar or rail, Other (comment)(RW) Assist level: Touching or steadying assistance (Pt.75%)  Function - Air cabin crew transfer activity did not occur: N/A Toilet transfer assistive device: Environmental manager lift: Stedy Assist level to toilet: Moderate assist (Pt 50 - 74%/lift or lower) Assist level from toilet: Moderate assist (Pt 50 - 74%/lift or lower)  Function - Chair/bed transfer Chair/bed transfer method: Stand pivot Chair/bed transfer assist level: Moderate assist (Pt 50 - 74%/lift or lower) Chair/bed transfer assistive device: Armrests Mechanical lift: Stedy Chair/bed transfer details: Verbal cues for technique, Manual facilitation for weight shifting, Verbal cues for precautions/safety, Manual facilitation for placement, Manual facilitation for weight bearing, Tactile cues for initiation  Function -  Locomotion: Wheelchair Will patient use wheelchair at discharge?: Yes Type: Manual Max wheelchair distance: 120 ft Assist Level: Supervision or verbal cues Assist Level: Supervision or verbal cues Assist Level: Supervision or verbal cues Turns around,maneuvers to table,bed, and toilet,negotiates 3% grade,maneuvers on rugs and over doorsills: No Function - Locomotion: Ambulation Assistive device: Rail in hallway Max distance: 30 ft Assist level: Touching or steadying assistance (Pt > 75%) Assist level: Touching or steadying assistance (Pt > 75%) Walk 50 feet with 2 turns activity did not occur: Safety/medical concerns Walk 150 feet activity did not occur: Safety/medical concerns Walk 10 feet on uneven surfaces activity did not occur: Safety/medical concerns  Function - Comprehension Comprehension: Auditory Comprehension assist level: Understands basic 50 - 74% of the time/ requires cueing 25 - 49% of the time  Function - Expression Expression: Verbal Expression assist level: Expresses basis less than 25% of the time/requires cueing >75% of the time.  Function - Social Interaction Social Interaction assist level: Interacts appropriately 25 - 49% of time - Needs frequent redirection.  Function - Problem Solving Problem solving assist level: Solves basic less than 25% of the time - needs direction nearly all the time or does not effectively solve problems and may need a restraint for safety  Function - Memory  Memory assist level: Recognizes or recalls less than 25% of the time/requires cueing greater than 75% of the time Patient normally able to recall (first 3 days only): That he or she is in a hospital, Staff names and faces  Medical Problem List and Plan: 1.   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  Formulized individual treatment plan based on medical history, underlying problem and comorbidities.  2. DVT Prophylaxis/Anticoagulation: Pharmaceutical:Heparin--continue 3. Pain Management:tylenol prn 4. Mood:LCSW to follow for evaluation when appropriate. 5. Neuropsych: This patientis notcapable of making decisions on hisown behalf. 6. Skin/Wound Care:routine pressure relief measures. 7. Fluids/Electrolytes/Nutrition:Monitor I/O. Continue to offer supplements between meals. 8.Hypertension:Hypotensionhas resolved and patient offMidodrine and Florinef Vitals:   06/01/17 1500 06/02/17 0135  BP: (!) 142/82 140/80  Pulse: 86 90  Resp: 17 16  Temp: 98.4 F (36.9 C) 98.1 F (36.7 C)  SpO2:  97%  Controlled 3/27 9. 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 at baseline BMP Latest Ref Rng & Units 05/28/2017 05/21/2017 05/20/2017  Glucose 65 - 99 mg/dL 205(H) 198(H) 171(H)  BUN 6 - 20 mg/dL 38(H) 20 15  Creatinine 0.61 - 1.24 mg/dL 1.90(H) 1.75(H) 1.87(H)  Sodium 135 - 145 mmol/L 132(L) 134(L) 135  Potassium 3.5 - 5.1 mmol/L 4.8 4.5 4.3  Chloride 101 - 111 mmol/L 98(L) 102 104  CO2 22 - 32 mmol/L 21(L) 21(L) 22  Calcium 8.9 - 10.3 mg/dL 9.2 8.9 8.6(L)   10. ABLA: Monitor H/H for signs of bleedinghas been in 10- 11 range. 11 on 3/15  12. Lethargy: CVA related Trial Ritalin with some improvement in arousal noted, no tachycard will titrate up, discuss with team  13. Oral thrush:  nystatin mouthwash as well as oral care every 4 hours while awake. 14. COPD/Left lung cancer: On Breo with albuterol prn.  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

## 2017-06-02 NOTE — Progress Notes (Signed)
Physical Therapy Session Note  Patient Details  Name: Edwin Jones MRN: [id number] Date of Birth: 06/27/1940  Today's Date: 06/02/2017 PT Individual Time: 6270-3500 PT Individual Time Calculation (min): 57 min   Short Term Goals: Week 2:  PT Short Term Goal 1 (Week 2): Pt will demonstrate equal LE weight distribution during functional tasks 50% of the time.  PT Short Term Goal 2 (Week 2): Pt will negotiate 4 steps w/ 2 rails, max assist PT Short Term Goal 3 (Week 2): Pt will self-propel w/c w/ supervision 150' PT Short Term Goal 4 (Week 2): Pt will transfer bed<>chair w/ min assist  PT Short Term Goal 5 (Week 2): Pt will attend to R environment w/o cues 50% of time during functional mobility  Skilled Therapeutic Interventions/Progress Updates:    Pt seated in w/c upon PT arrival, agreeable to therapy tx and denies pain. Pt propelled w/c from room>gym x 100 ft with supervision using L hemi technique, encouragement for participation. Pt ambulated 2 x 30 ft with RW and min assist, verbal cues for increased R step length. Pt performed stand pivot transfer from w/c<>mat stand pivot with mod assist and verbal cues for techniques. Pt performed x 5 sit<>stands from mat with contact guard assist working on midline and symmetric LE weightbearing, tactile cues for weightshifting. Pt standing with RW worked on swing phase with R LE stepping to/back from target on the floor, verbal cues for techniques. Pt seated edge of mat performed 2 x 5 hip flexion with R LE to bring knee to therapists hand for neuro re-ed. Therapist performed 2 x 30 sec hamstring stretch to R LE. Pt transfered from mat>w/c stand pivot with min assist. Pt transported back to room and left seated in w/c with QRB in place and wife present.   Therapy Documentation Precautions:  Precautions Precautions: Fall Precaution Comments: apraxic, R hemi, R inattention Restrictions Weight Bearing Restrictions: No   See Function Navigator for  Current Functional Status.   Therapy/Group: Individual Therapy  Netta Corrigan, PT, DPT 06/02/2017, 7:59 AM

## 2017-06-02 NOTE — Progress Notes (Signed)
Social Work Patient ID: Edwin Jones, male   DOB: 08-16-1940, 77 y.o.   MRN: [id number]  Spoke with wife via telephone to discuss his care and care required at this time. She does not want him in a NH and plans to take him home and hopefully will be able to provide the care he needs. She feels it is too much for her and her health going back and forth or having him be somewhere. This worker does not feel wife understands the deficits of her husband and feels once home he will be doing better. Continued to encourage her to come in and attend therapies with him. Gave her a team update and aware 4/9 target discharge date. Wife states: " If you need to extend it then do it." Wife doesn't seem to understand the process here.

## 2017-06-02 NOTE — Progress Notes (Signed)
Social Work   Rihanna Marseille, Eliezer Champagne  Social Worker  Physical Medicine and Rehabilitation  Patient Care Conference  Signed  Date of Service:  06/02/2017  1:30 PM          Signed          _0 Hide copied text  _1 Hover for details   Inpatient RehabilitationTeam Conference and Plan of Care Update Date: 06/02/2017   Time: 11:20 AM      Patient Name: Edwin Jones      Medical Record Number: [id number]  Date of Birth: [birth year] Sex: Male         Room/Bed: 4W16C/4W16C-01 Payor Info: Payor: MEDICARE / Plan: MEDICARE PART A AND B / Product Type: *No Product type* /     Admitting Diagnosis: Stroke CVA  Admit Date/Time:  05/20/2017  4:30 PM Admission Comments: No comment available    Primary Diagnosis:  <principal problem not specified> Principal Problem: <principal problem not specified>       Patient Active Problem List    Diagnosis Date Noted  . SOB (shortness of breath) on exertion    . Hemiparesis affecting right side as late effect of stroke ([place])    . Aphasia as late effect of stroke    . Small cell lung cancer, left upper lobe ([place])    . Nontraumatic acute hemorrhage of basal ganglia ([place]) 05/17/2017  . Anterior cerebral circulation hemorrhagic infarction 05/17/2017  . Pneumonia    . Cerebral edema (HCC)    . Cough    . Fever, unspecified    . Orthostatic hypotension    . Labile blood pressure    . Acute blood loss anemia    . Diabetes mellitus type 2 in nonobese (HCC)    . Labile blood glucose    . CKD (chronic kidney disease) 05/11/2017  . 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 Idiopathic hypotension 05/09/2017  . Stroke ([place]) 05/06/2017  . Stroke (cerebrum) ([place]) 05/06/2017  . Acute ischemic left MCA stroke ([place]) 05/06/2017  . Cancer of upper lobe of left lung ([place]) 10/30/2016  . Pure hypercholesterolemia 06/22/2014  . GERD without esophagitis 06/22/2014  . Benign essential hypertension 06/22/2014  . Diabetes mellitus without  complication ([place]) 28/41/3244      Expected Discharge Date: Expected Discharge Date: 06/15/17   Team Members Present: Physician leading conference: Dr. Alysia Penna Social Worker Present: Ovidio Kin, LCSW Nurse Present: Dorthula Nettles, RN PT Present: Michaelene Song, PT OT Present: Cherylynn Ridges, OT SLP Present: Weston Anna, SLP PPS Coordinator present : Daiva Nakayama, RN, CRRN       Current Status/Progress Goal Weekly Team Focus  Medical     aphasia, dysphagia, dysarthria , R HP, DM  CBG in goal range, avoid aspiration  DM management, family teaching for Lantus administration   Bowel/Bladder     continent  with episodes of bladder incontinence, LBM 06/01/17  less episode of incontinence  timed toileting   Swallow/Nutrition/ Hydration     Dys. 2 textures with thin liquids, Min A  Min A  trials of upgraded textures    ADL's     (S) to mod A for UB ADLs, min A-mod A LB ADLs  min A overall  R UE NMR, ADL retraining, motor planning    Mobility     mod assist bed mobility, mod assist for sit<>stand and stand pivot transfers, min assist gait at rail x 30 ft, mod assist gait with RW 40 ft  supervision, min  assist for steps  NMR, balance, functional mobility, cognition   Communication     Max A   Min A   auditory comprehension and verbal expression at the word and phrase level    Safety/Cognition/ Behavioral Observations             Pain     no c/o pain  pain scale <2  continue to assess & treat as needed   Skin     no signs of skin break down  no new areas of skin break down  assess q shift     *See Care Plan and progress notes for long and short-term goals.      Barriers to Discharge   Current Status/Progress Possible Resolutions Date Resolved   Physician     Medical stability     slow progress, see above  plan D/C in am      Nursing                 PT  Inaccessible home environment;Decreased caregiver support                 OT                 SLP             SW              Discharge Planning/Teaching Needs:  Wife upset with pt's second stroke and the deficits he has. She comes in the evenings but does plan to take him home if she is able      Team Discussion:  Making progress in therapies. R-inattention and needs cueing constantly. More continent with bladder and timed toileting. flucuates in his levels from hour to hour and time of day. Aphasic, apraxic and r-hemiparesis working on. Wife has been here and attended therapies with him, but still is somewhat unrealistic regarding pt's progress and deficits.  Revisions to Treatment Plan:  DC 4/9    Continued Need for Acute Rehabilitation Level of Care: The patient requires daily medical management by a physician with specialized training in physical medicine and rehabilitation for the following conditions: Daily direction of a multidisciplinary physical rehabilitation program to ensure safe treatment while eliciting the highest outcome that is of practical value to the patient.: Yes Daily medical management of patient stability for increased activity during participation in an intensive rehabilitation regime.: Yes Daily analysis of laboratory values and/or radiology reports with any subsequent need for medication adjustment of medical intervention for : Diabetes problems;Neurological problems   Cordarryl Monrreal, [name] 06/02/2017, 1:30 PM                  Kaci Dillie, [name], LCSW  Social Worker  Physical Medicine and Rehabilitation  Patient Care Conference  Signed  Date of Service:  05/26/2017  1:34 PM          Signed          _0 Hide copied text  _1 Hover for details   Inpatient RehabilitationTeam Conference and Plan of Care Update Date: 05/26/2017   Time: 11;20 AM      Patient Name: Edwin Jones      Medical Record Number: [id number]  Date of Birth: 12-15-1940 Sex: Male         Room/Bed: 4W16C/4W16C-01 Payor Info: Payor: MEDICARE / Plan: MEDICARE PART A AND B / Product Type:  *No Product type* /     Admitting Diagnosis: Stroke CVA  Admit Date/Time:  05/20/2017  4:30 PM Admission Comments: No comment available    Primary Diagnosis:  <principal problem not specified> Principal Problem: <principal problem not specified>       Patient Active Problem List    Diagnosis Date Noted  . SOB (shortness of breath) on exertion    . Hemiparesis affecting right side as late effect of stroke ([place])    . Aphasia as late effect of stroke    . Small cell lung cancer, left upper lobe ([place])    . Nontraumatic acute hemorrhage of basal ganglia ([place]) 05/17/2017  . Anterior cerebral circulation hemorrhagic infarction 05/17/2017  . Pneumonia    . Cerebral edema (HCC)    . Cough    . Fever, unspecified    . Orthostatic hypotension    . Labile blood pressure    . Acute blood loss anemia    . Diabetes mellitus type 2 in nonobese (HCC)    . Labile blood glucose    . CKD (chronic kidney disease) 05/11/2017  . Infarction of left basal ganglia ([place]) 05/11/2017  . Idiopathic hypotension 05/09/2017  . Stroke ([place]) 05/06/2017  . Stroke (cerebrum) ([place]) 05/06/2017  . Acute ischemic left MCA stroke ([place]) 05/06/2017  . Cancer of upper lobe of left lung ([place]) 10/30/2016  . Pure hypercholesterolemia 06/22/2014  . GERD without esophagitis 06/22/2014  . Benign essential hypertension 06/22/2014  . Diabetes mellitus without complication ([place]) 16/12/9602      Expected Discharge Date: Expected Discharge Date: 06/15/17   Team Members Present: Physician leading conference: Dr. Alysia Penna Social Worker Present: Ovidio Kin, LCSW Nurse Present: Arelia Sneddon, RN PT Present: Dwyane Dee, PT;Emily Benn Moulder, PT OT Present: Willeen Cass, OT SLP Present: Weston Anna, SLP PPS Coordinator present : Daiva Nakayama, RN, CRRN       Current Status/Progress Goal Weekly Team Focus  Medical     poor communication, dysphagia, asp PNA resolved  avoid recurrent PNA  work on cough     Bowel/Bladder     incontinent of B/B; lbm 3/18  timed toileting  toilet while awake q3hrs   Swallow/Nutrition/ Hydration     Dys. 2 textures with thin liquids, Min-Mod A for use of strategies   Min A  use of strategies, possible trials of upgraded textures    ADL's     Max A overall, apraxia, R hemiplegia  Supervision, lofty  modified bathing/dressing, initiation, R UE NMR, R attention, motor planning   Mobility     max assist overall  lofty supervision  NMR, balance, functional mobility, cognition   Communication     Max A   Min A   basic auditory comprehension, yes/no accuracy, naming    Safety/Cognition/ Behavioral Observations   Max A  Mod A   problem solving    Pain     denies pain; faces=0  <2  assess q shift and prn   Skin     abd bruising;  free from skin breakdown and/or infection  assess q shift and prn     *See Care Plan and progress notes for long and short-term goals.      Barriers to Discharge   Current Status/Progress Possible Resolutions Date Resolved   Physician     Medical stability     Slow progress, some inmprovement with level of alertness  Cont rehab      Nursing                 PT  OT                 SLP            SW Decreased caregiver support Wife has health issues of her own             Discharge Planning/Teaching Needs:  Wife wants to take him home, but has health issues of her own, so will depend upon how much care he requires. She is here daily and observed in therapies.      Team Discussion:  Goals supervision-min assist level. Working on timed toileting. Yes/no better and attention better. Dys 2 thin liquid diet. Fatigues easily and needs time to be allowed to do the tasks. Making good progress in therapies. Off antibiotics now for the pneumonia.  Revisions to Treatment Plan:  DC 4/9    Continued Need for Acute Rehabilitation Level of Care: The patient requires daily medical management by a physician with  specialized training in physical medicine and rehabilitation for the following conditions: Daily direction of a multidisciplinary physical rehabilitation program to ensure safe treatment while eliciting the highest outcome that is of practical value to the patient.: Yes Daily medical management of patient stability for increased activity during participation in an intensive rehabilitation regime.: Yes Daily analysis of laboratory values and/or radiology reports with any subsequent need for medication adjustment of medical intervention for : Neurological problems   Keta [place], [name] 05/26/2017, 1:35 PM                  Aryahi Denzler, [name], LCSW  Social Worker  Physical Medicine and Rehabilitation  Progress Notes  Signed  Date of Service:  05/12/2017  2:55 PM       Related encounter: Admission (Discharged) from 05/11/2017 in [place]          _0 Hide copied text  _1 Hover for details   Social Work  Social Work Assessment and Plan   Patient Details  Name: Edwin Jones MRN: [id number] Date of Birth: 28-Jan-1941   Today's Date: 05/12/2017   Problem List:      Patient Active Problem List    Diagnosis Date Noted  . CKD (chronic kidney disease) 05/11/2017  . Infarction of left basal ganglia ([place]) 05/11/2017  . Idiopathic hypotension 05/09/2017  . Stroke ([place]) 05/06/2017  . Stroke (cerebrum) ([place]) 05/06/2017  . Acute ischemic left MCA stroke ([place]) 05/06/2017  . Cancer of upper lobe of left lung ([place]) 10/30/2016  . Pure hypercholesterolemia 06/22/2014  . GERD without esophagitis 06/22/2014  . Benign essential hypertension 06/22/2014  . Diabetes mellitus without complication ([place]) 12/31/8525    Past Medical History:      Past Medical History:  Diagnosis Date  . Cancer ([place])    . COPD (chronic obstructive pulmonary disease) ([place])    . Diabetes mellitus without complication ([place])    . GERD without esophagitis    .  Hepatitis C virus      history of hep C treated with Harvoni  . Hypercholesterolemia    . Hypertension    . Seasonal allergies      Past Surgical History:       Past Surgical History:  Procedure Laterality Date  . COLONOSCOPY        approximately 11 years ago  . FLEXIBLE BRONCHOSCOPY N/A 11/05/2016    Procedure: FLEXIBLE BRONCHOSCOPY;  Surgeon: Wilhelmina Mcardle, MD;  Location: [facility] ORS;  Service:  Pulmonary;  Laterality: N/A;  . IR FLUORO GUIDE CV LINE RIGHT   05/06/2017  . IR INTRAVSC STENT CERV CAROTID W/O EMB-PROT MOD SED INC ANGIO   05/06/2017  . IR PERCUTANEOUS ART THROMBECTOMY/INFUSION INTRACRANIAL INC DIAG ANGIO   05/06/2017  . IR US GUIDE VASC ACCESS LEFT   05/06/2017  . IR US GUIDE VASC ACCESS RIGHT   05/06/2017  . IR US GUIDE VASC ACCESS RIGHT   05/06/2017  . PORTA CATH INSERTION N/A 11/23/2016    Procedure: Glori Luis Cath Insertion;  Surgeon: Algernon Huxley, MD;  Location: [facility];  Service: Cardiovascular;  Laterality: N/A;  . RADIOLOGY WITH ANESTHESIA N/A 05/06/2017    Procedure: IR WITH ANESTHESIA;  Surgeon: Radiologist, Medication, MD;  Location: [facility];  Service: Radiology;  Laterality: N/A;  . TEE WITHOUT CARDIOVERSION N/A 05/10/2017    Procedure: TRANSESOPHAGEAL ECHOCARDIOGRAM (TEE);  Surgeon: Sanda Klein, MD;  Location: [facility] ENDOSCOPY;  Service: Cardiovascular;  Laterality: N/A;  . TONSILLECTOMY        Social History:  reports that he quit smoking about 13 years ago. His smoking use included cigarettes. He has a 50.00 pack-year smoking history. he has never used smokeless tobacco. He reports that he does not drink alcohol or use drugs.   Family / Support Systems Marital Status: Married Patient Roles: Spouse, Parent, Other (Comment)(church deacon) Spouse/Significant Other: [name] [phone] Children: [name]-son  Other Supports: grandchildren are close and local Anticipated Caregiver: Wife Ability/Limitations of Caregiver: Wife has health  issues and needs to be careful-heart issues and SOB at times Caregiver Availability: 24/7 Family Dynamics: Close knit family who are there for one another, they have a good church family who visit and send their prayers. Pt is one who is very independent and wants to take care of himself, he was even with chemo and rad treatments.   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    Abuse/Neglect Abuse/Neglect Assessment Can Be Completed: Yes Physical Abuse: Denies Verbal Abuse: Denies Sexual Abuse: Denies Exploitation of patient/patient's resources: Denies Self-Neglect: Denies   Emotional Status Pt's affect, behavior adn adjustment status: Pt is motivated to do well and will do anything the therapy team asks of him to make improvements. His wife is here and trying to help but needs to let the therapists do their evaluations. She is willing to help while here just like being at home Recent Psychosocial Issues: other health issues had just finished his rad and chemo treatments for his lung cancer prior to coming into the hospital Pyschiatric History: No history deferred depression screen due to doing well and still adjusting to the new unit and program. He is coping appropriately and is talking about his feelings. Will monitor and have neruo-psych see while here if would be beneficial Substance Abuse History: No issues-quit tobacco years ago   Patient / Family Perceptions, Expectations & Goals Pt/Family understanding of illness & functional limitations: Pt and wife can explain his stroke and deficits. They both have spoken with the MD and feel thier questions and concerns  have been addressed. Wife plans to be here daily and ask the MD her questions that come up. She is not one to shy away from finding out the answers  she seeks. 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, father, grandfather, retiree, church deacon, friend, etc Anticipated changes in roles/activities/participation: resume Pt/family expectations/goals: Pt states: " I want to be able to take care of myself before I leave here."  Wife states: " I hope he can get back to what he was doing before this, I need him too."   [employer]: Other (Comment)(Cancer [facility]) Premorbid Home Care/DME Agencies: None Transportation available at discharge: Wife Resource referrals recommended: Support group (specify)   Discharge Planning Living Arrangements: Spouse/significant other Support Systems: Spouse/significant other, Children, Other relatives, Friends/neighbors, Social worker community Type of Residence: Private residence Insurance Resources: [employer] Financial Resources: Radio broadcast assistant Screen Referred: No Living Expenses: Education officer, community Management: Patient, Spouse Does the patient have any problems obtaining your medications?: No Home Management: Wife Patient/Family Preliminary Plans: Return home with wife who is willing to assist and maybe do too much for him at the expense of her own health issues. Glad she will be here daily and see him in therpaies so she will see what he can do for himself and what he will need assist with. Will await therapy team evaluations and work on discharge needs. Social Work Anticipated Follow Up Needs: Support Group, HH/OP   Clinical Impression Pleasant gentleman who is willing to push himself in therapies to achieve his goals of mod/i-supervision level. His wife is a strong support and tries to do too much for him, but feels she needs to do something while here. Will await therapy evaluations and work on a safe plan for him.WIll  monitor to see if neuro-psych needed.    Elease Hashimoto 05/12/2017, 2:55 PM               Daquon Greenleaf, [name], LCSW  Social Worker  Physical Medicine and Rehabilitation  Progress Notes  Signed  Date of Service:  05/12/2017  2:55 PM       Related encounter: Admission (Discharged) from 05/11/2017 in [place]          _0 Hide copied text  _1 Hover for details   Social Work  Social Work Assessment and Plan   Patient Details  Name: Mael Delap MRN: [id number] Date of Birth: [birth year]   Today's Date: 05/12/2017   Problem List:      Patient Active Problem List    Diagnosis Date Noted  . CKD (chronic kidney disease) 05/11/2017  . Infarction of left basal ganglia ([place]) 05/11/2017  . Idiopathic hypotension 05/09/2017  . Stroke ([place]) 05/06/2017  . Stroke (cerebrum) ([place]) 05/06/2017  . Acute ischemic left MCA stroke ([place]) 05/06/2017  . Cancer of upper lobe of left lung ([place]) 10/30/2016  . Pure hypercholesterolemia 06/22/2014  . GERD without esophagitis 06/22/2014  . Benign essential hypertension 06/22/2014  . Diabetes mellitus without complication ([place]) 41/96/2229    Past Medical History:      Past Medical History:  Diagnosis Date  . Cancer ([place])    . COPD (chronic obstructive pulmonary disease) ([place])    . Diabetes mellitus without complication ([place])    . GERD without esophagitis    . Hepatitis C virus      history of hep C treated with Harvoni  . Hypercholesterolemia    . Hypertension    . Seasonal allergies      Past Surgical History:       Past Surgical History:  Procedure Laterality Date  . COLONOSCOPY  approximately 11 years ago  . FLEXIBLE BRONCHOSCOPY N/A 11/05/2016    Procedure: FLEXIBLE BRONCHOSCOPY;  Surgeon: Wilhelmina Mcardle, MD;  Location: [facility] ORS;  Service: Pulmonary;  Laterality: N/A;  . IR FLUORO GUIDE CV LINE RIGHT   05/06/2017  . IR INTRAVSC STENT CERV CAROTID W/O EMB-PROT MOD SED  INC ANGIO   05/06/2017  . IR PERCUTANEOUS ART THROMBECTOMY/INFUSION INTRACRANIAL INC DIAG ANGIO   05/06/2017  . IR US GUIDE VASC ACCESS LEFT   05/06/2017  . IR US GUIDE VASC ACCESS RIGHT   05/06/2017  . IR US GUIDE VASC ACCESS RIGHT   05/06/2017  . PORTA CATH INSERTION N/A 11/23/2016    Procedure: Glori Luis Cath Insertion;  Surgeon: Algernon Huxley, MD;  Location: [facility];  Service: Cardiovascular;  Laterality: N/A;  . RADIOLOGY WITH ANESTHESIA N/A 05/06/2017    Procedure: IR WITH ANESTHESIA;  Surgeon: Radiologist, Medication, MD;  Location: [facility];  Service: Radiology;  Laterality: N/A;  . TEE WITHOUT CARDIOVERSION N/A 05/10/2017    Procedure: TRANSESOPHAGEAL ECHOCARDIOGRAM (TEE);  Surgeon: Sanda Klein, MD;  Location: [facility] ENDOSCOPY;  Service: Cardiovascular;  Laterality: N/A;  . TONSILLECTOMY        Social History:  reports that he quit smoking about 13 years ago. His smoking use included cigarettes. He has a 50.00 pack-year smoking history. he has never used smokeless tobacco. He reports that he does not drink alcohol or use drugs.   Family / Support Systems Marital Status: Married Patient Roles: Spouse, Parent, Other (Comment)(church deacon) Spouse/Significant Other: [name] [phone] Children: [name]-son  Other Supports: grandchildren are close and local Anticipated Caregiver: Wife Ability/Limitations of Caregiver: Wife has health issues and needs to be careful-heart issues and SOB at times Caregiver Availability: 24/7 Family Dynamics: Close knit family who are there for one another, they have a good church family who visit and send their prayers. Pt is one who is very independent and wants to take care of himself, he was even with chemo and rad treatments.   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    Abuse/Neglect Abuse/Neglect Assessment Can Be Completed: Yes Physical Abuse: Denies Verbal Abuse: Denies Sexual Abuse: Denies Exploitation of patient/patient's resources: Denies Self-Neglect: Denies   Emotional Status Pt's affect, behavior adn adjustment status: Pt is motivated to do well and will do anything the therapy team asks of him to make improvements. His wife is here and trying to help but needs to let the therapists do their evaluations. She is willing to help while here just like being at home Recent Psychosocial Issues: other health issues had just finished his rad and chemo treatments for his lung cancer prior to coming into the hospital Pyschiatric History: No history deferred depression screen due to doing well and still adjusting to the new unit and program. He is coping appropriately and is talking about his feelings. Will monitor and have neruo-psych see while here if would be beneficial Substance Abuse History: No issues-quit tobacco years ago   Patient / Family Perceptions, Expectations & Goals Pt/Family understanding of illness & functional limitations: Pt and wife can explain his stroke and deficits. They both have spoken with the MD and feel thier questions and concerns have been  addressed. Wife plans to be here daily and ask the MD her questions that come up. She is not one to shy away from finding out the answers she seeks. 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, father, grandfather, retiree, church deacon, friend, etc Anticipated changes in roles/activities/participation: resume Pt/family expectations/goals: Pt states: " I want to be able to take care of myself before I leave here."  Wife states: " I hope he can get back to what he was doing before this, I need him too."   [employer]: Other (Comment)(Cancer [facility]) Premorbid Home Care/DME Agencies: None Transportation available at discharge: Wife Resource referrals recommended: Support group (specify)   Discharge Planning Living Arrangements: Spouse/significant other Support Systems: Spouse/significant other, Children, Other relatives, Friends/neighbors, Social worker community Type of Residence: Private residence Insurance Resources: [employer] Financial Resources: Radio broadcast assistant Screen Referred: No Living Expenses: Education officer, community Management: Patient, Spouse Does the patient have any problems obtaining your medications?: No Home Management: Wife Patient/Family Preliminary Plans: Return home with wife who is willing to assist and maybe do too much for him at the expense of her own health issues. Glad she will be here daily and see him in therpaies so she will see what he can do for himself and what he will need assist with. Will await therapy team evaluations and work on discharge needs. Social Work Anticipated Follow Up Needs: Support Group, HH/OP   Clinical Impression Pleasant gentleman who is willing to push himself in therapies to achieve his goals of mod/i-supervision level. His wife is a strong support and tries to do too much for him, but feels she needs to do something while here. Will await therapy evaluations and work on a safe plan for him.WIll monitor to see if neuro-psych needed.    Elease Hashimoto 05/12/2017, 2:55 PM               Lilyanne Mcquown, [name], LCSW  Social Worker  Physical Medicine and Rehabilitation  Progress Notes  Signed  Date of Service:  05/12/2017  2:55 PM       Related encounter: Admission (Discharged) from 05/11/2017 in [place]          _0 Hide copied text  _1 Hover for details   Social Work  Social Work Assessment and Plan   Patient Details  Name: Anguel Delapena MRN:  [id number] Date of Birth: 1940-06-01   Today's Date: 05/12/2017   Problem List:      Patient Active Problem List    Diagnosis Date Noted  . CKD (chronic kidney disease) 05/11/2017  . Infarction of left basal ganglia ([place]) 05/11/2017  . Idiopathic hypotension 05/09/2017  . Stroke ([place]) 05/06/2017  . Stroke (cerebrum) ([place]) 05/06/2017  . Acute ischemic left MCA stroke ([place]) 05/06/2017  . Cancer of upper lobe of left lung ([place]) 10/30/2016  . Pure hypercholesterolemia 06/22/2014  . GERD without esophagitis 06/22/2014  . Benign essential hypertension 06/22/2014  . Diabetes mellitus without complication ([place]) 38/75/6433    Past Medical History:      Past Medical History:  Diagnosis Date  . Cancer ([place])    . COPD (chronic obstructive pulmonary disease) ([place])    . Diabetes mellitus without complication ([place])    . GERD without esophagitis    . Hepatitis C virus      history of hep C treated with Harvoni  . Hypercholesterolemia    . Hypertension    . Seasonal allergies  Past Surgical History:       Past Surgical History:  Procedure Laterality Date  . COLONOSCOPY        approximately 11 years ago  . FLEXIBLE BRONCHOSCOPY N/A 11/05/2016    Procedure: FLEXIBLE BRONCHOSCOPY;  Surgeon: Wilhelmina Mcardle, MD;  Location: [facility] ORS;  Service: Pulmonary;  Laterality: N/A;  . IR FLUORO GUIDE CV LINE RIGHT   05/06/2017  . IR INTRAVSC STENT CERV CAROTID W/O EMB-PROT MOD SED INC ANGIO   05/06/2017  . IR PERCUTANEOUS ART THROMBECTOMY/INFUSION INTRACRANIAL INC DIAG ANGIO   05/06/2017  . IR US GUIDE VASC ACCESS LEFT   05/06/2017  . IR US GUIDE VASC ACCESS RIGHT   05/06/2017  . IR US GUIDE VASC ACCESS RIGHT   05/06/2017  . PORTA CATH INSERTION N/A 11/23/2016    Procedure: Glori Luis Cath Insertion;  Surgeon: Algernon Huxley, MD;  Location: [place] CV LAB;  Service: Cardiovascular;  Laterality: N/A;  . RADIOLOGY WITH ANESTHESIA N/A 05/06/2017    Procedure: IR WITH ANESTHESIA;  Surgeon: Radiologist,  Medication, MD;  Location: [facility];  Service: Radiology;  Laterality: N/A;  . TEE WITHOUT CARDIOVERSION N/A 05/10/2017    Procedure: TRANSESOPHAGEAL ECHOCARDIOGRAM (TEE);  Surgeon: Sanda Klein, MD;  Location: [facility] ENDOSCOPY;  Service: Cardiovascular;  Laterality: N/A;  . TONSILLECTOMY        Social History:  reports that he quit smoking about 13 years ago. His smoking use included cigarettes. He has a 50.00 pack-year smoking history. he has never used smokeless tobacco. He reports that he does not drink alcohol or use drugs.   Family / Support Systems Marital Status: Married Patient Roles: Spouse, Parent, Other (Comment)(church deacon) Spouse/Significant Other: [name] [phone] Children: [name]-son  Other Supports: grandchildren are close and local Anticipated Caregiver: Wife Ability/Limitations of Caregiver: Wife has health issues and needs to be careful-heart issues and SOB at times Caregiver Availability: 24/7 Family Dynamics: Close knit family who are there for one another, they have a good church family who visit and send their prayers. Pt is one who is very independent and wants to take care of himself, he was even with chemo and rad treatments.   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    Abuse/Neglect Abuse/Neglect Assessment Can Be Completed: Yes Physical Abuse: Denies Verbal Abuse: Denies Sexual Abuse: Denies Exploitation of patient/patient's resources: Denies Self-Neglect: Denies   Emotional Status Pt's affect, behavior adn adjustment status: Pt is motivated to do well and will do anything the therapy team asks of him to  make improvements. His wife is here and trying to help but needs to let the therapists do their evaluations. She is willing to help while here just like being at home Recent Psychosocial Issues: other health issues had just finished his rad and chemo treatments for his lung cancer prior to coming into the hospital Pyschiatric History: No history deferred depression screen due to doing well and still adjusting to the new unit and program. He is coping appropriately and is talking about his feelings. Will monitor and have neruo-psych see while here if would be beneficial Substance Abuse History: No issues-quit tobacco years ago   Patient / Family Perceptions, Expectations & Goals Pt/Family understanding of illness &  functional limitations: Pt and wife can explain his stroke and deficits. They both have spoken with the MD and feel thier questions and concerns have been addressed. Wife plans to be here daily and ask the MD her questions that come up. She is not one to shy away from finding out the answers she seeks. 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, father, grandfather, retiree, church deacon, friend, etc Anticipated changes in roles/activities/participation: resume Pt/family expectations/goals: Pt states: " I want to be able to take care of myself before I leave here."  Wife states: " I hope he can get back to what he was doing before this, I need him too."   [employer]: Other (Comment)(Cancer [facility]) Premorbid Home Care/DME Agencies: None Transportation available at discharge: Wife Resource referrals recommended: Support group (specify)   Discharge Planning Living Arrangements: Spouse/significant other Support Systems: Spouse/significant other, Children, Other relatives, Friends/neighbors, Social worker community Type of Residence: Private residence Insurance Resources: [employer] Financial Resources: Radio broadcast assistant Screen Referred:  No Living Expenses: Education officer, community Management: Patient, Spouse Does the patient have any problems obtaining your medications?: No Home Management: Wife Patient/Family Preliminary Plans: Return home with wife who is willing to assist and maybe do too much for him at the expense of her own health issues. Glad she will be here daily and see him in therpaies so she will see what he can do for himself and what he will need assist with. Will await therapy team evaluations and work on discharge needs. Social Work Anticipated Follow Up Needs: Support Group, HH/OP   Clinical Impression Pleasant gentleman who is willing to push himself in therapies to achieve his goals of mod/i-supervision level. His wife is a strong support and tries to do too much for him, but feels she needs to do something while here. Will await therapy evaluations and work on a safe plan for him.WIll monitor to see if neuro-psych needed.    Elease Hashimoto 05/12/2017, 2:55 PM               Radames Mejorado, [name], LCSW  Social Worker  Physical Medicine and Rehabilitation  Progress Notes  Signed  Date of Service:  05/12/2017  2:55 PM       Related encounter: Admission (Discharged) from 05/11/2017 in [place]          _0 Hide copied text  _1 Hover for details   Social Work  Social Work Assessment and Plan   Patient Details  Name: Shenouda Genova MRN: [id number] Date of Birth: [birth year]   Today's Date: 05/12/2017   Problem List:      Patient Active Problem List    Diagnosis Date Noted  . CKD (chronic kidney disease) 05/11/2017  . Infarction of left basal ganglia ([place]) 05/11/2017  . Idiopathic hypotension 05/09/2017  . Stroke ([place]) 05/06/2017  . Stroke (cerebrum) ([place]) 05/06/2017  . Acute ischemic left MCA stroke ([place]) 05/06/2017  . Cancer of upper lobe of left lung ([place]) 10/30/2016  . Pure hypercholesterolemia 06/22/2014  . GERD without esophagitis 06/22/2014  .  Benign essential hypertension 06/22/2014  . Diabetes mellitus without complication ([place]) 51/76/1607    Past Medical History:      Past Medical History:  Diagnosis Date  . Cancer ([place])    . COPD (chronic obstructive pulmonary disease) ([place])    . Diabetes mellitus without complication ([place])    . GERD without esophagitis    . Hepatitis C virus  history of hep C treated with Harvoni  . Hypercholesterolemia    . Hypertension    . Seasonal allergies      Past Surgical History:       Past Surgical History:  Procedure Laterality Date  . COLONOSCOPY        approximately 11 years ago  . FLEXIBLE BRONCHOSCOPY N/A 11/05/2016    Procedure: FLEXIBLE BRONCHOSCOPY;  Surgeon: Wilhelmina Mcardle, MD;  Location: [facility] ORS;  Service: Pulmonary;  Laterality: N/A;  . IR FLUORO GUIDE CV LINE RIGHT   05/06/2017  . IR INTRAVSC STENT CERV CAROTID W/O EMB-PROT MOD SED INC ANGIO   05/06/2017  . IR PERCUTANEOUS ART THROMBECTOMY/INFUSION INTRACRANIAL INC DIAG ANGIO   05/06/2017  . IR US GUIDE VASC ACCESS LEFT   05/06/2017  . IR US GUIDE VASC ACCESS RIGHT   05/06/2017  . IR US GUIDE VASC ACCESS RIGHT   05/06/2017  . PORTA CATH INSERTION N/A 11/23/2016    Procedure: Glori Luis Cath Insertion;  Surgeon: Algernon Huxley, MD;  Location: [place] CV LAB;  Service: Cardiovascular;  Laterality: N/A;  . RADIOLOGY WITH ANESTHESIA N/A 05/06/2017    Procedure: IR WITH ANESTHESIA;  Surgeon: Radiologist, Medication, MD;  Location: [place] [facility];  Service: Radiology;  Laterality: N/A;  . TEE WITHOUT CARDIOVERSION N/A 05/10/2017    Procedure: TRANSESOPHAGEAL ECHOCARDIOGRAM (TEE);  Surgeon: Sanda Klein, MD;  Location: [facility] ENDOSCOPY;  Service: Cardiovascular;  Laterality: N/A;  . TONSILLECTOMY        Social History:  reports that he quit smoking about 13 years ago. His smoking use included cigarettes. He has a 50.00 pack-year smoking history. he has never used smokeless tobacco. He reports that he does not drink alcohol or use drugs.    Family / Support Systems Marital Status: Married Patient Roles: Spouse, Parent, Other (Comment)(church deacon) Spouse/Significant Other: [name] [phone] Children: [name]-son  Other Supports: grandchildren are close and local Anticipated Caregiver: Wife Ability/Limitations of Caregiver: Wife has health issues and needs to be careful-heart issues and SOB at times Caregiver Availability: 24/7 Family Dynamics: Close knit family who are there for one another, they have a good church family who visit and send their prayers. Pt is one who is very independent and wants to take care of himself, he was even with chemo and rad treatments.   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    Abuse/Neglect Abuse/Neglect Assessment Can Be Completed: Yes Physical Abuse: Denies Verbal Abuse: Denies Sexual Abuse: Denies Exploitation of patient/patient's resources: Denies Self-Neglect: Denies   Emotional Status Pt's affect, behavior adn adjustment status: Pt is motivated to do well and will do anything the therapy team asks of him to make improvements. His wife is here and trying to help but needs to let the therapists do their evaluations. She is willing to help while here just like being at home Recent Psychosocial Issues: other health issues had just finished his rad and chemo treatments for his lung cancer prior to coming into the hospital Pyschiatric History: No history deferred depression screen due to doing well and still adjusting to the new unit and program. He is coping appropriately and is talking about his feelings. Will monitor and have  neruo-psych see while here  if would be beneficial Substance Abuse History: No issues-quit tobacco years ago   Patient / Family Perceptions, Expectations & Goals Pt/Family understanding of illness & functional limitations: Pt and wife can explain his stroke and deficits. They both have spoken with the MD and feel thier questions and concerns have been addressed. Wife plans to be here daily and ask the MD her questions that come up. She is not one to shy away from finding out the answers she seeks. 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, father, grandfather, retiree, church deacon, friend, etc Anticipated changes in roles/activities/participation: resume Pt/family expectations/goals: Pt states: " I want to be able to take care of myself before I leave here."  Wife states: " I hope he can get back to what he was doing before this, I need him too."   [employer]: Other (Comment)(Cancer [facility]) Premorbid Home Care/DME Agencies: None Transportation available at discharge: Wife Resource referrals recommended: Support group (specify)   Discharge Planning Living Arrangements: Spouse/significant other Support Systems: Spouse/significant other, Children, Other relatives, Friends/neighbors, Social worker community Type of Residence: Private residence Insurance Resources: [employer] Financial Resources: Radio broadcast assistant Screen Referred: No Living Expenses: Education officer, community Management: Patient, Spouse Does the patient have any problems obtaining your medications?: No Home Management: Wife Patient/Family Preliminary Plans: Return home with wife who is willing to assist and maybe do too much for him at the expense of her own health issues. Glad she will be here daily and see him in therpaies so she will see what he can do for himself and what he will need assist with. Will await therapy team evaluations and work on discharge needs. Social Work Anticipated Follow Up  Needs: Support Group, HH/OP   Clinical Impression Pleasant gentleman who is willing to push himself in therapies to achieve his goals of mod/i-supervision level. His wife is a strong support and tries to do too much for him, but feels she needs to do something while here. Will await therapy evaluations and work on a safe plan for him.WIll monitor to see if neuro-psych needed.    Elease Hashimoto 05/12/2017, 2:55 PM               Chesney Suares, [name], LCSW  Social Worker  Physical Medicine and Rehabilitation  Progress Notes  Signed  Date of Service:  05/12/2017  2:55 PM       Related encounter: Admission (Discharged) from 05/11/2017 in [place]          _0 Hide copied text  _1 Hover for details   Social Work  Social Work Assessment and Plan   Patient Details  Name: Dmani Mizer MRN: [id number] Date of Birth: 1940/12/06   Today's Date: 05/12/2017   Problem List:      Patient Active Problem List    Diagnosis Date Noted  . CKD (chronic kidney disease) 05/11/2017  . Infarction of left basal ganglia ([place]) 05/11/2017  . Idiopathic hypotension 05/09/2017  . Stroke ([place]) 05/06/2017  . Stroke (cerebrum) ([place]) 05/06/2017  . Acute ischemic left MCA stroke ([place]) 05/06/2017  . Cancer of upper lobe of left lung ([place]) 10/30/2016  . Pure hypercholesterolemia 06/22/2014  . GERD without esophagitis 06/22/2014  . Benign essential hypertension 06/22/2014  . Diabetes mellitus without complication ([place]) 28/31/5176    Past Medical History:      Past Medical History:  Diagnosis Date  . Cancer ([place])    . COPD (chronic obstructive pulmonary  disease) ([place])    . Diabetes mellitus without complication ([place])    . GERD without esophagitis    . Hepatitis C virus      history of hep C treated with Harvoni  . Hypercholesterolemia    . Hypertension    . Seasonal allergies      Past Surgical History:       Past Surgical History:   Procedure Laterality Date  . COLONOSCOPY        approximately 11 years ago  . FLEXIBLE BRONCHOSCOPY N/A 11/05/2016    Procedure: FLEXIBLE BRONCHOSCOPY;  Surgeon: Wilhelmina Mcardle, MD;  Location: [facility] ORS;  Service: Pulmonary;  Laterality: N/A;  . IR FLUORO GUIDE CV LINE RIGHT   05/06/2017  . IR INTRAVSC STENT CERV CAROTID W/O EMB-PROT MOD SED INC ANGIO   05/06/2017  . IR PERCUTANEOUS ART THROMBECTOMY/INFUSION INTRACRANIAL INC DIAG ANGIO   05/06/2017  . IR US GUIDE VASC ACCESS LEFT   05/06/2017  . IR US GUIDE VASC ACCESS RIGHT   05/06/2017  . IR US GUIDE VASC ACCESS RIGHT   05/06/2017  . PORTA CATH INSERTION N/A 11/23/2016    Procedure: Glori Luis Cath Insertion;  Surgeon: Algernon Huxley, MD;  Location: [place] CV LAB;  Service: Cardiovascular;  Laterality: N/A;  . RADIOLOGY WITH ANESTHESIA N/A 05/06/2017    Procedure: IR WITH ANESTHESIA;  Surgeon: Radiologist, Medication, MD;  Location: [place];  Service: Radiology;  Laterality: N/A;  . TEE WITHOUT CARDIOVERSION N/A 05/10/2017    Procedure: TRANSESOPHAGEAL ECHOCARDIOGRAM (TEE);  Surgeon: Sanda Klein, MD;  Location: [facility] ENDOSCOPY;  Service: Cardiovascular;  Laterality: N/A;  . TONSILLECTOMY        Social History:  reports that he quit smoking about 13 years ago. His smoking use included cigarettes. He has a 50.00 pack-year smoking history. he has never used smokeless tobacco. He reports that he does not drink alcohol or use drugs.   Family / Support Systems Marital Status: Married Patient Roles: Spouse, Parent, Other (Comment)(church deacon) Spouse/Significant Other: [name] [phone] Children: [name]-son  Other Supports: grandchildren are close and local Anticipated Caregiver: Wife Ability/Limitations of Caregiver: Wife has health issues and needs to be careful-heart issues and SOB at times Caregiver Availability: 24/7 Family Dynamics: Close knit family who are there for one another, they have a good church family who  visit and send their prayers. Pt is one who is very independent and wants to take care of himself, he was even with chemo and rad treatments.   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    Abuse/Neglect Abuse/Neglect Assessment Can Be Completed: Yes Physical Abuse: Denies Verbal Abuse: Denies Sexual Abuse: Denies Exploitation of patient/patient's resources: Denies Self-Neglect: Denies   Emotional Status Pt's affect, behavior adn adjustment status: Pt is motivated to do well and will do anything the therapy team asks of him to make improvements. His wife is here and trying to help but needs to let the therapists do their evaluations. She is willing to help while here just like being at home Recent Psychosocial Issues: other health issues had just finished his rad and chemo treatments for his lung cancer prior to coming into the hospital Pyschiatric History: No history deferred depression screen due  to doing well and still adjusting to the new unit and program. He is coping appropriately and is talking about his feelings. Will monitor and have neruo-psych see while here if would be beneficial Substance Abuse History: No issues-quit tobacco years ago   Patient / Family Perceptions, Expectations & Goals Pt/Family understanding of illness & functional limitations: Pt and wife can explain his stroke and deficits. They both have spoken with the MD and feel thier questions and concerns have been addressed. Wife plans to be here daily and ask the MD her questions that come up. She is not one to shy away from finding out the answers she seeks. Premorbid pt/family  roles/activities: Husband, father, grandfather, retiree, church deacon, friend, etc Anticipated changes in roles/activities/participation: resume Pt/family expectations/goals: Pt states: " I want to be able to take care of myself before I leave here."  Wife states: " I hope he can get back to what he was doing before this, I need him too."   [employer]: Other (Comment)(Cancer [facility]) Premorbid Home Care/DME Agencies: None Transportation available at discharge: Wife Resource referrals recommended: Support group (specify)   Discharge Planning Living Arrangements: Spouse/significant other Support Systems: Spouse/significant other, Children, Other relatives, Friends/neighbors, Social worker community Type of Residence: Private residence Insurance Resources: [employer] Financial Resources: Radio broadcast assistant Screen Referred: No Living Expenses: Education officer, community Management: Patient, Spouse Does the patient have any problems obtaining your medications?: No Home Management: Wife Patient/Family Preliminary Plans: Return home with wife who is willing to assist and maybe do too much for him at the expense of her own health issues. Glad she will be here daily and see him in therpaies so she will see what he can do for himself and what he will need assist with. Will await therapy team evaluations and work on discharge needs. Social Work Anticipated Follow Up Needs: Support Group, HH/OP   Clinical Impression Pleasant gentleman who is willing to push himself in therapies to achieve his goals of mod/i-supervision level. His wife is a strong support and tries to do too much for him, but feels she needs to do something while here. Will await therapy evaluations and work on a safe plan for him.WIll monitor to see if neuro-psych needed.    Elease Hashimoto 05/12/2017, 2:55 PM               Stanislaus Kaltenbach, [name], LCSW  Social Worker  Physical Medicine and Rehabilitation   Progress Notes  Signed  Date of Service:  05/12/2017  2:55 PM       Related encounter: Admission (Discharged) from 05/11/2017 in [place]          _0 Hide copied text  _1 Hover for details   Social Work  Social Work Assessment and Plan   Patient Details  Name: Del Overfelt MRN: [id number] Date of Birth: 1940/11/22   Today's Date: 05/12/2017   Problem List:      Patient Active Problem List    Diagnosis Date Noted  . CKD (chronic kidney disease) 05/11/2017  . Infarction of left basal ganglia ([place]) 05/11/2017  . Idiopathic hypotension 05/09/2017  . Stroke ([place]) 05/06/2017  . Stroke (cerebrum) ([place]) 05/06/2017  . Acute ischemic left MCA stroke ([place]) 05/06/2017  . Cancer of upper lobe of left lung ([place]) 10/30/2016  . Pure hypercholesterolemia 06/22/2014  . GERD without esophagitis 06/22/2014  . Benign essential hypertension 06/22/2014  . Diabetes mellitus without complication (  [place]) 06/22/2014    Past Medical History:      Past Medical History:  Diagnosis Date  . Cancer ([place])    . COPD (chronic obstructive pulmonary disease) ([place])    . Diabetes mellitus without complication ([place])    . GERD without esophagitis    . Hepatitis C virus      history of hep C treated with Harvoni  . Hypercholesterolemia    . Hypertension    . Seasonal allergies      Past Surgical History:       Past Surgical History:  Procedure Laterality Date  . COLONOSCOPY        approximately 11 years ago  . FLEXIBLE BRONCHOSCOPY N/A 11/05/2016    Procedure: FLEXIBLE BRONCHOSCOPY;  Surgeon: Wilhelmina Mcardle, MD;  Location: [facility] ORS;  Service: Pulmonary;  Laterality: N/A;  . IR FLUORO GUIDE CV LINE RIGHT   05/06/2017  . IR INTRAVSC STENT CERV CAROTID W/O EMB-PROT MOD SED INC ANGIO   05/06/2017  . IR PERCUTANEOUS ART THROMBECTOMY/INFUSION INTRACRANIAL INC DIAG ANGIO   05/06/2017  . IR US GUIDE VASC ACCESS LEFT   05/06/2017  . IR US GUIDE VASC ACCESS  RIGHT   05/06/2017  . IR US GUIDE VASC ACCESS RIGHT   05/06/2017  . PORTA CATH INSERTION N/A 11/23/2016    Procedure: Glori Luis Cath Insertion;  Surgeon: Algernon Huxley, MD;  Location: [facility];  Service: Cardiovascular;  Laterality: N/A;  . RADIOLOGY WITH ANESTHESIA N/A 05/06/2017    Procedure: IR WITH ANESTHESIA;  Surgeon: Radiologist, Medication, MD;  Location: [facility];  Service: Radiology;  Laterality: N/A;  . TEE WITHOUT CARDIOVERSION N/A 05/10/2017    Procedure: TRANSESOPHAGEAL ECHOCARDIOGRAM (TEE);  Surgeon: Sanda Klein, MD;  Location: [facility] ENDOS[facility];  Service: Cardiovascular;  Laterality: N/A;  . TONSILLECTOMY        Social History:  reports that he quit smoking about 13 years ago. His smoking use included cigarettes. He has a 50.00 pack-year smoking history. he has never used smokeless tobacco. He reports that he does not drink alcohol or use drugs.   Family / Support Systems Marital Status: Married Patient Roles: Spouse, Parent, Other (Comment)(church deacon) Spouse/Significant Other: [name] [phone] Children: [name]-son  Other Supports: grandchildren are close and local Anticipated Caregiver: Wife Ability/Limitations of Caregiver: Wife has health issues and needs to be careful-heart issues and SOB at times Caregiver Availability: 24/7 Family Dynamics: Close knit family who are there for one another, they have a good church family who visit and send their prayers. Pt is one who is very independent and wants to take care of himself, he was even with chemo and rad treatments.   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     Abuse/Neglect Abuse/Neglect Assessment Can Be Completed: Yes Physical Abuse: Denies Verbal Abuse: Denies Sexual Abuse: Denies Exploitation of patient/patient's resources: Denies Self-Neglect: Denies   Emotional Status Pt's affect, behavior adn adjustment status: Pt is motivated to do well and will do anything the therapy team asks of him to make improvements. His wife is here and trying to help but needs to let the therapists do their evaluations. She is willing to help while here just like being at home Recent  Psychosocial Issues: other health issues had just finished his rad and chemo treatments for his lung cancer prior to coming into the hospital Pyschiatric History: No history deferred depression screen due to doing well and still adjusting to the new unit and program. He is coping appropriately and is talking about his feelings. Will monitor and have neruo-psych see while here if would be beneficial Substance Abuse History: No issues-quit tobacco years ago   Patient / Family Perceptions, Expectations & Goals Pt/Family understanding of illness & functional limitations: Pt and wife can explain his stroke and deficits. They both have spoken with the MD and feel thier questions and concerns have been addressed. Wife plans to be here daily and ask the MD her questions that come up. She is not one to shy away from finding out the answers she seeks. 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, father, grandfather, retiree, church deacon, friend, etc Anticipated changes in roles/activities/participation: resume Pt/family expectations/goals: Pt states: " I want to be able to take care of myself before I leave here."  Wife states: " I hope he can get back to what he was doing before this, I need him too."   [employer]: Other (Comment)(Cancer [facility]) Premorbid Home Care/DME Agencies: None Transportation available at discharge: Wife Resource referrals  recommended: Support group (specify)   Discharge Planning Living Arrangements: Spouse/significant other Support Systems: Spouse/significant other, Children, Other relatives, Friends/neighbors, Social worker community Type of Residence: Private residence Insurance Resources: [employer] Financial Resources: Radio broadcast assistant Screen Referred: No Living Expenses: Education officer, community Management: Patient, Spouse Does the patient have any problems obtaining your medications?: No Home Management: Wife Patient/Family Preliminary Plans: Return home with wife who is willing to assist and maybe do too much for him at the expense of her own health issues. Glad she will be here daily and see him in therpaies so she will see what he can do for himself and what he will need assist with. Will await therapy team evaluations and work on discharge needs. Social Work Anticipated Follow Up Needs: Support Group, HH/OP   Clinical Impression Pleasant gentleman who is willing to push himself in therapies to achieve his goals of mod/i-supervision level. His wife is a strong support and tries to do too much for him, but feels she needs to do something while here. Will await therapy evaluations and work on a safe plan for him.WIll monitor to see if neuro-psych needed.    Elease Hashimoto 05/12/2017, 2:55 PM               Rechel Delosreyes, [name], LCSW  Social Worker  Physical Medicine and Rehabilitation  Progress Notes  Signed  Date of Service:  05/12/2017  2:55 PM       Related encounter: Admission (Discharged) from 05/11/2017 in [place]          _0 Hide copied text  _1 Hover for details   Social Work  Social Work Assessment and Plan   Patient Details  Name: Edwin Jones MRN: [id number] Date of Birth: February 22, 1941   Today's Date: 05/12/2017   Problem List:      Patient Active Problem List    Diagnosis Date Noted  . CKD (chronic kidney disease)  05/11/2017  . Infarction of left basal ganglia ([place]) 05/11/2017  . Idiopathic hypotension 05/09/2017  . Stroke ([place]) 05/06/2017  . Stroke (cerebrum) ([place]) 05/06/2017  . Acute ischemic left MCA stroke ([place]) 05/06/2017  . Cancer  of upper lobe of left lung ([place]) 10/30/2016  . Pure hypercholesterolemia 06/22/2014  . GERD without esophagitis 06/22/2014  . Benign essential hypertension 06/22/2014  . Diabetes mellitus without complication ([place]) 39/76/7341    Past Medical History:      Past Medical History:  Diagnosis Date  . Cancer ([place])    . COPD (chronic obstructive pulmonary disease) ([place])    . Diabetes mellitus without complication ([place])    . GERD without esophagitis    . Hepatitis C virus      history of hep C treated with Harvoni  . Hypercholesterolemia    . Hypertension    . Seasonal allergies      Past Surgical History:       Past Surgical History:  Procedure Laterality Date  . COLONOSCOPY        approximately 11 years ago  . FLEXIBLE BRONCHOSCOPY N/A 11/05/2016    Procedure: FLEXIBLE BRONCHOSCOPY;  Surgeon: Wilhelmina Mcardle, MD;  Location: [facility] ORS;  Service: Pulmonary;  Laterality: N/A;  . IR FLUORO GUIDE CV LINE RIGHT   05/06/2017  . IR INTRAVSC STENT CERV CAROTID W/O EMB-PROT MOD SED INC ANGIO   05/06/2017  . IR PERCUTANEOUS ART THROMBECTOMY/INFUSION INTRACRANIAL INC DIAG ANGIO   05/06/2017  . IR US GUIDE VASC ACCESS LEFT   05/06/2017  . IR US GUIDE VASC ACCESS RIGHT   05/06/2017  . IR US GUIDE VASC ACCESS RIGHT   05/06/2017  . PORTA CATH INSERTION N/A 11/23/2016    Procedure: Glori Luis Cath Insertion;  Surgeon: Algernon Huxley, MD;  Location: [facility];  Service: Cardiovascular;  Laterality: N/A;  . RADIOLOGY WITH ANESTHESIA N/A 05/06/2017    Procedure: IR WITH ANESTHESIA;  Surgeon: Radiologist, Medication, MD;  Location: [facility];  Service: Radiology;  Laterality: N/A;  . TEE WITHOUT CARDIOVERSION N/A 05/10/2017    Procedure: TRANSESOPHAGEAL ECHOCARDIOGRAM (TEE);  Surgeon:  Sanda Klein, MD;  Location: [facility] ENDOSCOPY;  Service: Cardiovascular;  Laterality: N/A;  . TONSILLECTOMY        Social History:  reports that he quit smoking about 13 years ago. His smoking use included cigarettes. He has a 50.00 pack-year smoking history. he has never used smokeless tobacco. He reports that he does not drink alcohol or use drugs.   Family / Support Systems Marital Status: Married Patient Roles: Spouse, Parent, Other (Comment)(church deacon) Spouse/Significant Other: [name] [phone] Children: [name]-son  Other Supports: grandchildren are close and local Anticipated Caregiver: Wife Ability/Limitations of Caregiver: Wife has health issues and needs to be careful-heart issues and SOB at times Caregiver Availability: 24/7 Family Dynamics: Close knit family who are there for one another, they have a good church family who visit and send their prayers. Pt is one who is very independent and wants to take care of himself, he was even with chemo and rad treatments.   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    Abuse/Neglect Abuse/Neglect Assessment Can Be Completed: Yes Physical Abuse: Denies Verbal Abuse: Denies Sexual Abuse: Denies Exploitation of patient/patient's resources: Denies Self-Neglect: Denies   Emotional Status Pt's affect, behavior adn adjustment status: Pt is motivated to do well and will do anything the therapy team asks of him to make improvements.  His wife is here and trying to help but needs to let the therapists do their evaluations. She is willing to help while here just like being at home Recent  Psychosocial Issues: other health issues had just finished his rad and chemo treatments for his lung cancer prior to coming into the hospital Pyschiatric History: No history deferred depression screen due to doing well and still adjusting to the new unit and program. He is coping appropriately and is talking about his feelings. Will monitor and have neruo-psych see while here if would be beneficial Substance Abuse History: No issues-quit tobacco years ago   Patient / Family Perceptions, Expectations & Goals Pt/Family understanding of illness & functional limitations: Pt and wife can explain his stroke and deficits. They both have spoken with the MD and feel thier questions and concerns have been addressed. Wife plans to be here daily and ask the MD her questions that come up. She is not one to shy away from finding out the answers she seeks. 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, father, grandfather, retiree, church deacon, friend, etc Anticipated changes in roles/activities/participation: resume Pt/family expectations/goals: Pt states: " I want to be able to take care of myself before I leave here."  Wife states: " I hope he can get back to what he was doing before this, I need him too."   [employer]: Other (Comment)(Cancer [facility]) Premorbid Home Care/DME Agencies: None Transportation available at discharge: Wife Resource referrals recommended: Support group (specify)   Discharge Planning Living Arrangements: Spouse/significant other Support Systems: Spouse/significant other, Children, Other relatives, Friends/neighbors, Social worker community Type of Residence: Private residence Insurance Resources: [employer] Financial Resources: Radio broadcast assistant Screen Referred: No Living Expenses: Education officer, community Management: Patient, Spouse Does the patient have any problems obtaining your medications?: No Home Management: Wife Patient/Family Preliminary  Plans: Return home with wife who is willing to assist and maybe do too much for him at the expense of her own health issues. Glad she will be here daily and see him in therpaies so she will see what he can do for himself and what he will need assist with. Will await therapy team evaluations and work on discharge needs. Social Work Anticipated Follow Up Needs: Support Group, HH/OP   Clinical Impression Pleasant gentleman who is willing to push himself in therapies to achieve his goals of mod/i-supervision level. His wife is a strong support and tries to do too much for him, but feels she needs to do something while here. Will await therapy evaluations and work on a safe plan for him.WIll monitor to see if neuro-psych needed.    Elease Hashimoto 05/12/2017, 2:55 PM               Teng Decou, [name], LCSW  Social Worker  Physical Medicine and Rehabilitation  Patient Care Conference  Signed  Date of Service:  05/12/2017  2:36 PM          Signed          _0 Hide copied text  _1 Hover for details   Inpatient RehabilitationTeam Conference and Plan of Care Update Date: 05/12/2017   Time: 11:20 AM      Patient Name: Edwin Jones      Medical Record Number: [id number]  Date of Birth: December 07, 1940 Sex: Male         Room/Bed: 4W10C/4W10C-01 Payor Info: Payor: MEDICARE / Plan: MEDICARE PART A AND B / Product Type: *No Product type* /     Admitting Diagnosis: STROKE  CVA  Admit Date/Time:  05/11/2017  3:27 PM Admission Comments: No comment available    Primary Diagnosis:  <principal problem not specified> Principal Problem: <principal problem not specified>       Patient Active Problem List    Diagnosis Date Noted  . CKD (chronic kidney disease) 05/11/2017  . Infarction of left basal ganglia ([place]) 05/11/2017  . Idiopathic hypotension 05/09/2017  . Stroke ([place]) 05/06/2017  . Stroke (cerebrum) ([place]) 05/06/2017  . Acute ischemic left MCA stroke ([place]) 05/06/2017  . Cancer of upper  lobe of left lung ([place]) 10/30/2016  . Pure hypercholesterolemia 06/22/2014  . GERD without esophagitis 06/22/2014  . Benign essential hypertension 06/22/2014  . Diabetes mellitus without complication ([place]) 23/76/2831      Expected Discharge Date:     Team Members Present: Physician leading conference: Dr. Alysia Penna Social Worker Present: Ovidio Kin, LCSW Nurse Present: Rayetta Pigg, RN OT Present: Cherylynn Ridges, OT SLP Present: Weston Anna, SLP PPS Coordinator present : Daiva Nakayama, RN, CRRN       Current Status/Progress Goal Weekly Team Focus  Medical       adjusting meds and checking labs   medical stable     Bowel/Bladder     Incontinent of B/B  Regain continence  Timed toileting   Swallow/Nutrition/ Hydration               ADL's     min A overall with delayed processing, expressive aphasia, memory deficits, R side incoordination  supervision overall  ADL training, balance, functional mobilty, RUE Coordination, cognitive activites, pt/ family education   Mobility     eval pending  eval pending  eval pending   Communication     eval pending  complete eval      Safety/Cognition/ Behavioral Observations   eval pending  complete eval      Pain     Denies Pain  Pain < 2  Assess Qshift and PRN   Skin     Skin intact  Prevent skin breakdown and infection  Assess qshift and PRN     *See Care Plan and progress notes for long and short-term goals.      Barriers to Discharge   Current Status/Progress Possible Resolutions Date Resolved   Physician                    Nursing                 PT                    OT                 SLP            SW              Discharge Planning/Teaching Needs:    Home with wife who can provide supervision level she has health issues of her own. She is here today and participating in his therapies, at times needs to sit down and relax.     Team Discussion:  New evaluation and setting goals in therapies. MD  adjusting meds and checking labs.  Revisions to Treatment Plan:  New eval      Elease Hashimoto 05/12/2017, 2:36 PM                 Patient ID: Jernard Reiber, male   DOB: 1940-12-24, 77 y.o.   MRN: [id number]

## 2017-06-03 ENCOUNTER — Inpatient Hospital Stay (HOSPITAL_COMMUNITY): Payer: Medicare Other | Admitting: Physical Therapy

## 2017-06-03 ENCOUNTER — Inpatient Hospital Stay (HOSPITAL_COMMUNITY): Payer: Medicare Other | Admitting: Speech Pathology

## 2017-06-03 ENCOUNTER — Inpatient Hospital Stay (HOSPITAL_COMMUNITY): Payer: Medicare Other

## 2017-06-03 ENCOUNTER — Inpatient Hospital Stay (HOSPITAL_COMMUNITY): Payer: Medicare Other | Admitting: Occupational Therapy

## 2017-06-03 LAB — GLUCOSE, CAPILLARY
GLUCOSE-CAPILLARY: 178 mg/dL — AB (ref 65–99)
Glucose-Capillary: 238 mg/dL — ABNORMAL HIGH (ref 65–99)
Glucose-Capillary: 167 mg/dL — ABNORMAL HIGH (ref 65–99)
Glucose-Capillary: 211 mg/dL — ABNORMAL HIGH (ref 65–99)

## 2017-06-03 NOTE — Progress Notes (Signed)
Occupational Therapy Session Note  Patient Details  Name: Edwin Jones MRN: [id number] Date of Birth: 12/23/1940  Today's Date: 06/03/2017 OT Individual Time: 1415-1445 OT Individual Time Calculation (min): 30 min    Short Term Goals: Week 2:  OT Short Term Goal 1 (Week 2): Pt will demonstrate improved dynamic sitting balance by donning pants over R leg from a seated position with Min A OT Short Term Goal 2 (Week 2): Pt will demonstrate improved activity tolerance to stand at the sink with min A to complete oral care. OT Short Term Goal 3 (Week 2): Pt will complete toilet transfer with consistent Mod A OT Short Term Goal 4 (Week 2): Pt will complete 1/3 toileting steps with min A for balance if needed  Skilled Therapeutic Interventions/Progress Updates:    Pt received sitting up in w/c in room, with no c/o pain and agreeable to therapy. Pt was brought down to therapy gym with close friend present. Pt completed stand pivot transfer to mat, requiring manual facilitation to position  RUE during transfer. Pt sat on edge of mat unsupported, completing bimanual shoulder abduction activity with friend. Pt required manual cues at R UE to facilitate abduction and flexion and to provide AAROM. Pt then completed functional reaching task with his R UE, requiring manual facilitation when reaching greater than 40 degrees in shoulder flex. An XL therapy ball was placed in front of pt and pt rested his R UE on the ball and rolled back forward and back to facilitate increased postural control and increased shoulder ROM. Pt then tolerated passive pec stretch in sitting to facilitate more upright posture and muscle activation. Pt returned to room, AFO doffed and quick release belt donned with all needs met.   Therapy Documentation Precautions:  Precautions Precautions: Fall Precaution Comments: apraxic, R hemi, R inattention Restrictions Weight Bearing Restrictions: No Vital Signs: Therapy Vitals Temp: (!)  97.5 F (36.4 C) Temp Source: Oral Pulse Rate: 80 Resp: 18 BP: 131/80 Patient Position (if appropriate): Sitting Oxygen Therapy SpO2: 100 % O2 Device: Room Air Pain: Pain Assessment Pain Score: 0-No pain Faces Pain Scale: No hurt ADL: ADL ADL Comments: Please see functional navigator  See Function Navigator for Current Functional Status.   Therapy/Group: Individual Therapy  Curtis Sites 06/03/2017, 3:24 PM

## 2017-06-03 NOTE — Progress Notes (Signed)
Physical Therapy Session Note  Patient Details  Name: Edwin Jones MRN: [id number] Date of Birth: 18-Jun-1940  Today's Date: 06/03/2017 PT Individual Time: 0721-8288 PT Individual Time Calculation (min): 55 min   Short Term Goals: Week 2:  PT Short Term Goal 1 (Week 2): Pt will demonstrate equal LE weight distribution during functional tasks 50% of the time.  PT Short Term Goal 2 (Week 2): Pt will negotiate 4 steps w/ 2 rails, max assist PT Short Term Goal 3 (Week 2): Pt will self-propel w/c w/ supervision 150' PT Short Term Goal 4 (Week 2): Pt will transfer bed<>chair w/ min assist  PT Short Term Goal 5 (Week 2): Pt will attend to R environment w/o cues 50% of time during functional mobility  Skilled Therapeutic Interventions/Progress Updates:   Pt in w/c and agreeable to therapy, no c/o pain. Pt self-propelled w/c to gym using L hemi technique for practice w/ functional mobility, supervision overall, min-mod verbal cues for technique. Focused on gait and pregait training this session. Ambulated 69' w/ RW, min guard for upright and mod assist for lateral weight-shifting in order to clear RLE from ground during swing phase. Verbal and manual cues for increased R knee and hip flexion in swing as well. Ambulated 20' w/ R AFO w/ increased foot clearance and 20' w/ both R AFO and R toe slide for decreased toe drag. Min manual assist for lateral weight shifting when ambulating w/ RAFO and toe slide. Performed RLE tapping on 2" step to facilitate increased hip and knee flexion during swing limb advancement phase, min manual assist for lateral weight shifting, otherwise increased hip and knee flexion compared to during gait. Educated pt on clinical reasoning behind stepping to step for carryover to gait pattern. Brief seated rest breaks in between gait and standing bouts. Returned to room total assist in w/c, call bell within reach and all needs met. QRB donned.   Therapy Documentation Precautions:   Precautions Precautions: Fall Precaution Comments: apraxic, R hemi, R inattention Restrictions Weight Bearing Restrictions: No Pain: Pain Assessment Pain Score: 0-No pain  See Function Navigator for Current Functional Status.   Therapy/Group: Individual Therapy  Edwin Jones 06/03/2017, 2:33 PM

## 2017-06-03 NOTE — Progress Notes (Signed)
Attempt to instruct pt in use of Flutter valve.  Able to breathe out on own and intermittently with request.  Unable to blow air using flutter device.  Holds breath instead.  No cough effort on request.  Continue attempts to have pt use as able.  Will notify RN

## 2017-06-03 NOTE — Progress Notes (Signed)
Subjective/Complaints: Poor progress in therapy, remain aphasic   ROS: ? Y/N accuracy   Objective: Vital Signs: Blood pressure 133/71, pulse 81, temperature 98.5 F (36.9 C), temperature source Oral, resp. rate 18, height 6\' 3"  (1.905 m), weight 90.2 kg (198 lb 13.7 oz), SpO2 100 %. No results found. Results for orders placed or performed during the hospital encounter of 05/20/17 (from the past 72 hour(s))  Glucose, capillary     Status: Abnormal   Collection Time: 05/31/17 12:00 PM  Result Value Ref Range   Glucose-Capillary 187 (H) 65 - 99 mg/dL  Glucose, capillary     Status: Abnormal   Collection Time: 05/31/17  4:35 PM  Result Value Ref Range   Glucose-Capillary 216 (H) 65 - 99 mg/dL  Glucose, capillary     Status: Abnormal   Collection Time: 05/31/17  9:12 PM  Result Value Ref Range   Glucose-Capillary 320 (H) 65 - 99 mg/dL  Glucose, capillary     Status: Abnormal   Collection Time: 06/01/17  6:47 AM  Result Value Ref Range   Glucose-Capillary 255 (H) 65 - 99 mg/dL  Glucose, capillary     Status: Abnormal   Collection Time: 06/01/17 12:07 PM  Result Value Ref Range   Glucose-Capillary 179 (H) 65 - 99 mg/dL  Glucose, capillary     Status: Abnormal   Collection Time: 06/01/17  4:34 PM  Result Value Ref Range   Glucose-Capillary 246 (H) 65 - 99 mg/dL  Glucose, capillary     Status: Abnormal   Collection Time: 06/01/17  9:01 PM  Result Value Ref Range   Glucose-Capillary 267 (H) 65 - 99 mg/dL  Glucose, capillary     Status: Abnormal   Collection Time: 06/02/17  6:45 AM  Result Value Ref Range   Glucose-Capillary 204 (H) 65 - 99 mg/dL  Glucose, capillary     Status: Abnormal   Collection Time: 06/02/17 11:27 AM  Result Value Ref Range   Glucose-Capillary 230 (H) 65 - 99 mg/dL  Glucose, capillary     Status: Abnormal   Collection Time: 06/02/17  4:37 PM  Result Value Ref Range   Glucose-Capillary 181 (H) 65 - 99 mg/dL  Glucose, capillary     Status: Abnormal   Collection Time: 06/02/17  9:02 PM  Result Value Ref Range   Glucose-Capillary 220 (H) 65 - 99 mg/dL  Glucose, capillary     Status: Abnormal   Collection Time: 06/03/17  6:43 AM  Result Value Ref Range   Glucose-Capillary 178 (H) 65 - 99 mg/dL     HEENT: poor dentition Cardio:RRR without murmur. No JVD  Resp: CTA Bilaterally without wheezes or rales. Normal effort  GI: BS positive and non tender Extremity:  No Edema Skin:   Intact and Other portacath site intact Neuro: lethargic and aphasic.  Abnormal Motor 3/5 RUE, 4/5 RLE , 5/5 on Left side,  Right inattention.  Musc/Skel:  Other no pain with UE or LE ROM Gen NAD   Assessment/Plan: 1. 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 Physiatrist is providing close team supervision and 24 hour management of active medical problems listed below. Physiatrist and rehab team continue to assess barriers to discharge/monitor patient progress toward functional and medical goals. FIM: Function - Bathing Position: Sitting EOB Body parts bathed by patient: Right arm, Left arm, Chest, Abdomen, Front perineal area, Left lower leg, Right lower leg,  Left upper leg, Right upper leg Body parts bathed by helper: Back, Buttocks Assist Level: Touching or steadying assistance(Pt > 75%)  Function- Upper Body Dressing/Undressing What is the patient wearing?: Pull over shirt/dress Pull over shirt/dress - Perfomed by patient: Put head through opening, Thread/unthread left sleeve, Thread/unthread right sleeve Pull over shirt/dress - Perfomed by helper: Pull shirt over trunk Assist Level: Supervision or verbal cues Function - Lower Body Dressing/Undressing What is the patient wearing?: Underwear, Pants, Shoes Position: Other (comment)(on toilet) Underwear - Performed by patient: Pull underwear up/down Underwear - Performed by  helper: Thread/unthread right underwear leg, Thread/unthread left underwear leg Pants- Performed by patient: Pull pants up/down Pants- Performed by helper: Thread/unthread right pants leg, Thread/unthread left pants leg Non-skid slipper socks- Performed by helper: Don/doff right sock, Don/doff left sock Socks - Performed by helper: Don/doff right sock, Don/doff left sock Shoes - Performed by patient: Don/doff left shoe Shoes - Performed by helper: Don/doff right shoe, Fasten right, Fasten left, Don/doff left shoe Assist for footwear: Partial/moderate assist Assist for lower body dressing: Touching or steadying assistance (Pt > 75%)  Function - Toileting Toileting activity did not occur: No continent bowel/bladder event Toileting steps completed by helper: Adjust clothing prior to toileting, Performs perineal hygiene, Adjust clothing after toileting Toileting Assistive Devices: Grab bar or rail, Other (comment)(RW) Assist level: Touching or steadying assistance (Pt.75%)  Function - Air cabin crew transfer activity did not occur: N/A Toilet transfer assistive device: Environmental manager lift: Stedy Assist level to toilet: Moderate assist (Pt 50 - 74%/lift or lower) Assist level from toilet: Moderate assist (Pt 50 - 74%/lift or lower)  Function - Chair/bed transfer Chair/bed transfer method: Stand pivot Chair/bed transfer assist level: Touching or steadying assistance (Pt > 75%) Chair/bed transfer assistive device: Armrests Mechanical lift: Stedy Chair/bed transfer details: Manual facilitation for weight shifting, Verbal cues for precautions/safety, Manual facilitation for placement, Manual facilitation for weight bearing, Tactile cues for initiation, Tactile cues for sequencing  Function - Locomotion: Wheelchair Will patient use wheelchair at discharge?: Yes Type: Manual Max wheelchair distance: 120 ft Assist Level: Supervision or verbal cues Assist Level: Supervision or  verbal cues Assist Level: Supervision or verbal cues Turns around,maneuvers to table,bed, and toilet,negotiates 3% grade,maneuvers on rugs and over doorsills: No Function - Locomotion: Ambulation Assistive device: Walker-rolling Max distance: 30 ft Assist level: Touching or steadying assistance (Pt > 75%) Assist level: Touching or steadying assistance (Pt > 75%) Walk 50 feet with 2 turns activity did not occur: Safety/medical concerns Walk 150 feet activity did not occur: Safety/medical concerns Walk 10 feet on uneven surfaces activity did not occur: Safety/medical concerns  Function - Comprehension Comprehension: Auditory Comprehension assist level: Understands basic 50 - 74% of the time/ requires cueing 25 - 49% of the time  Function - Expression Expression: Verbal Expression assist level: Expresses basis less than 25% of the time/requires cueing >75% of the time.  Function - Social Interaction Social Interaction assist level: Interacts appropriately 25 - 49% of time - Needs frequent redirection.  Function - Problem Solving Problem solving assist level: Solves basic 25 - 49% of the time - needs direction more than half the time to initiate, plan or complete simple activities  Function - Memory Memory assist level: Recognizes or recalls less than 25% of the time/requires cueing greater than 75% of the time Patient normally able to recall (first 3 days only): That he or she is in a hospital, Staff names and faces  Medical Problem List and Plan:  1.   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  2. DVT Prophylaxis/Anticoagulation: Pharmaceutical:Heparin--continue 3. Pain Management:tylenol prn 4. Mood:LCSW to follow for evaluation when appropriate. 5. Neuropsych: This patientis notcapable of making decisions on hisown behalf. 6. Skin/Wound Care:routine pressure relief measures. 7. Fluids/Electrolytes/Nutrition:Monitor  I/O. Continue to offer supplements between meals. 8.Hypertension:Hypotensionhas resolved and patient offMidodrine and Florinef Vitals:   06/02/17 1356 06/03/17 0123  BP: 130/76 133/71  Pulse: 85 81  Resp: 16 18  Temp: 98.3 F (36.8 C) 98.5 F (36.9 C)  SpO2: 100% 100%  Controlled 3/28 9. Acute on chronic WYO:VZCHYIFO SCr- 1.8-1.9. at baseline BMP Latest Ref Rng & Units 05/28/2017 05/21/2017 05/20/2017  Glucose 65 - 99 mg/dL 205(H) 198(H) 171(H)  BUN 6 - 20 mg/dL 38(H) 20 15  Creatinine 0.61 - 1.24 mg/dL 1.90(H) 1.75(H) 1.87(H)  Sodium 135 - 145 mmol/L 132(L) 134(L) 135  Potassium 3.5 - 5.1 mmol/L 4.8 4.5 4.3  Chloride 101 - 111 mmol/L 98(L) 102 104  CO2 22 - 32 mmol/L 21(L) 21(L) 22  Calcium 8.9 - 10.3 mg/dL 9.2 8.9 8.6(L)   10. ABLA: Monitor H/H for signs of bleedinghas been in 10- 11 range. 11 on 3/15  12. Lethargy: CVA related Trial Ritalin with some improvement in arousal noted, no tachycard will titrate up, discuss with team  13. Oral thrush:  nystatin mouthwash as well as oral care every 4 hours while awake. 14. COPD/Left lung cancer: On Breo with albuterol prn.  15.DM: poorly controlled     -SSI   -added low dose amaryl on 3/16, increase to 4mg  on 3/22-monitor, not a good candidate for Metformin due to CKD CBG (last 3)  Recent Labs    06/02/17 1637 06/02/17 2102 06/03/17 0643  GLUCAP 181* 220* 178*  increase lantus,20U LOS (Days) 14 A FACE TO FACE EVALUATION WAS PERFORMED  Luanna Salk Kirsteins 06/03/2017, 7:54 AM

## 2017-06-03 NOTE — Progress Notes (Signed)
Occupational Therapy Session Note  Patient Details  Name: Edwin Jones MRN: [id number] Date of Birth: 1941/01/11  Today's Date: 06/03/2017 OT Individual Time: 1005-1100 OT Individual Time Calculation (min): 55 min   Short Term Goals: Week 2:  OT Short Term Goal 1 (Week 2): Pt will demonstrate improved dynamic sitting balance by donning pants over R leg from a seated position with Min A OT Short Term Goal 2 (Week 2): Pt will demonstrate improved activity tolerance to stand at the sink with min A to complete oral care. OT Short Term Goal 3 (Week 2): Pt will complete toilet transfer with consistent Mod A OT Short Term Goal 4 (Week 2): Pt will complete 1/3 toileting steps with min A for balance if needed  Skilled Therapeutic Interventions/Progress Updates:    OT treatment session focused on modified bathing/dressing, functional use of R UE, sit<>stand, and standing balance. Pt needed mod A to come to sitting EOB with difficulty achieving sitting balance initially 2/2 lateral and posterior lean. Max A to squat-pivot to wc on L side 2/2 motor planning deficits on difficulty powering up. Forced use of R UE for bathing task with increased time and max encouragement integrate R UE. Pt with poor recall of hemi-techniques, so let pt try to don clothes his own way to see if he could problem solve. Pt able to don wifebeater, but had it on backwards, so had pt re-try with OT orienting shirt with more success. Pt with difficulty donning pants without using hemi-techniques, eventually having to stop pt and re-start using modified strategies. Pt still needed assistance to thread R LE. Sit<>stand with mod A to power up, then integrated forced use of R UE to try to pull up pants. Verbal cues throughout session to slow down breathing. Pt left seated in wc at end of session with safety belt on and needs met.   Therapy Documentation Precautions:  Precautions Precautions: Fall Precaution Comments: apraxic, R hemi, R  inattention Restrictions Weight Bearing Restrictions: No Pain: Pain Assessment Pain Scale: 0-10 Pain Score: 0-No pain ADL: ADL ADL Comments: Please see functional navigator    See Function Navigator for Current Functional Status.   Therapy/Group: Individual Therapy  Valma Cava 06/03/2017, 10:42 AM

## 2017-06-03 NOTE — Progress Notes (Signed)
Speech Language Pathology Daily Session Note  Patient Details  Name: Edwin Jones MRN: [id number] Date of Birth: [birth year]  Today's Date: 06/03/2017 SLP Individual Time: 1100-1158 SLP Individual Time Calculation (min): 58 min  Short Term Goals: Week 2: SLP Short Term Goal 1 (Week 2): Pt will sustain attention to task for ~ 30 minutes with Mod A cues.  SLP Short Term Goal 2 (Week 2): Pt will select requested object in field of 2 with Mod A cues in 8 out of 10 opportunities.  SLP Short Term Goal 3 (Week 2): Pt will name common object in 8 of 10 opportunities with Mod A cues.  SLP Short Term Goal 4 (Week 2): Pt will follow 1 step simple directions in 8 out of 10 opportunities with Mod A cues.  SLP Short Term Goal 5 (Week 2): Pt will answer basic yes/no question in 5 out of 10 opportunities with Mod A cues.  SLP Short Term Goal 6 (Week 2): Pt will consume dysphagia 2 diet with thin liquids and minimal overt s/s of aspiration and Min A cues for use of compensatory swallow strategies.   Skilled Therapeutic Interventions: Skilled treatment session focused on dysphagia and communication goals. SLP facilitated session by administering trials of dys. 3 textures. Pt consumed graham crackers without overt s/s of aspiration, but demonstrated prolonged mastication and mild oral residue that required a liquid wash to clear. Therefore, recommend continue trials of dys. 3 textures with SLP. SLP also facilitated session by providing Mod A verbal cues for pt to decode at the phrase level with 100% accuracy. Pt demonstrated word-finding during a sentence completion task in 75% of opportunities independently and 100% with Mod A verbal cues. Pt answered basic yes/no questions in regards to his basic wants/needs with 100% accuracy. Pt left upright in wheelchair with quick release belt on and NT present. Continue with current plan of care.      Function:  Cognition Comprehension Comprehension assist level:  Understands basic 50 - 74% of the time/ requires cueing 25 - 49% of the time  Expression   Expression assist level: Expresses basis less than 25% of the time/requires cueing >75% of the time.  Social Interaction Social Interaction assist level: Interacts appropriately 50 - 74% of the time - May be physically or verbally inappropriate.  Problem Solving Problem solving assist level: Solves basic 25 - 49% of the time - needs direction more than half the time to initiate, plan or complete simple activities  Memory Memory assist level: Recognizes or recalls less than 25% of the time/requires cueing greater than 75% of the time    Pain Pain Assessment Pain Score: 0-No pain  Therapy/Group: Individual Therapy  Meredeth Ide  SLP - Student 06/03/2017, 12:02 PM

## 2017-06-04 ENCOUNTER — Inpatient Hospital Stay (HOSPITAL_COMMUNITY): Payer: Medicare Other | Admitting: Speech Pathology

## 2017-06-04 ENCOUNTER — Inpatient Hospital Stay (HOSPITAL_COMMUNITY): Payer: Medicare Other | Admitting: Occupational Therapy

## 2017-06-04 ENCOUNTER — Inpatient Hospital Stay (HOSPITAL_COMMUNITY): Payer: Medicare Other | Admitting: Physical Therapy

## 2017-06-04 LAB — BASIC METABOLIC PANEL
Anion gap: 12 (ref 5–15)
BUN: 39 mg/dL — AB (ref 6–20)
CALCIUM: 9.6 mg/dL (ref 8.9–10.3)
CO2: 20 mmol/L — AB (ref 22–32)
CREATININE: 1.94 mg/dL — AB (ref 0.61–1.24)
Chloride: 100 mmol/L — ABNORMAL LOW (ref 101–111)
GFR calc Af Amer: 37 mL/min — ABNORMAL LOW (ref >60)
GFR calc non Af Amer: 32 mL/min — ABNORMAL LOW (ref >60)
GLUCOSE: 188 mg/dL — AB (ref 65–99)
Potassium: 5 mmol/L (ref 3.5–5.1)
Sodium: 132 mmol/L — ABNORMAL LOW (ref 135–145)

## 2017-06-04 LAB — GLUCOSE, CAPILLARY
Glucose-Capillary: 164 mg/dL — ABNORMAL HIGH (ref 65–99)
Glucose-Capillary: 159 mg/dL — ABNORMAL HIGH (ref 65–99)
Glucose-Capillary: 212 mg/dL — ABNORMAL HIGH (ref 65–99)
Glucose-Capillary: 179 mg/dL — ABNORMAL HIGH (ref 65–99)

## 2017-06-04 NOTE — Progress Notes (Signed)
Physical Therapy Weekly Progress Note  Patient Details  Name: Edwin Jones MRN: [id number] Date of Birth: 1940-06-07  Beginning of progress report period: May 28, 2017 End of progress report period: June 04, 2017  Today's Date: 06/04/2017 PT Individual Time: 1320-1450 PT Individual Time Calculation (min): 90 min   Patient has met 3 of 4 short term goals. Pt is consistently performing bed mobility and transfers at the min assist level, however continues to require mod-max assist w/ gait depending on level of fatigue and pt's motivation to participate. He is able to self-propel w/c using L hemi technique w/ supervision community level distances. Wife continues to remain supportive and observes therapy sessions, she is unable to provide physical assistance and is anxious about d/c to home. Will continue to educate wife on CLOF and initiate hands-on training as appropriate. She states she can arrange for physical assist at home but has been unable to state explicitly whom that will be. Additionally, she continues to remain resistant to SNF placement.   Patient continues to demonstrate the following deficits muscle weakness, decreased cardiorespiratoy endurance, impaired timing and sequencing, unbalanced muscle activation, decreased coordination and decreased motor planning, decreased initiation, decreased problem solving, decreased safety awareness and delayed processing and decreased standing balance, decreased postural control, hemiplegia and decreased balance strategies and therefore will continue to benefit from skilled PT intervention to increase functional independence with mobility.  Patient progressing toward long term goals..  Continue plan of care.  PT Short Term Goals Week 2:  PT Short Term Goal 1 (Week 2): Pt will demonstrate equal LE weight distribution during functional tasks 50% of the time.  PT Short Term Goal 1 - Progress (Week 2): Met PT Short Term Goal 2 (Week 2): Pt will  negotiate 4 steps w/ 2 rails, max assist PT Short Term Goal 2 - Progress (Week 2): Progressing toward goal PT Short Term Goal 3 (Week 2): Pt will self-propel w/c w/ supervision 150' PT Short Term Goal 3 - Progress (Week 2): Met PT Short Term Goal 4 (Week 2): Pt will transfer bed<>chair w/ min assist  PT Short Term Goal 4 - Progress (Week 2): Met PT Short Term Goal 5 (Week 2): Pt will attend to R environment w/o cues 50% of time during functional mobility PT Short Term Goal 5 - Progress (Week 2): Met Week 3:  PT Short Term Goal 1 (Week 3): Pt will negotiate 1 curb w/ RW w/ mod assist  PT Short Term Goal 2 (Week 3): Pt will perform bed mobility w/o use of hospital bed functions w/ min assist PT Short Term Goal 3 (Week 3): Pt will ambulate 25' w/ LRAD w/ min assist  Skilled Therapeutic Interventions/Progress Updates:   Pt in w/c and agreeable to therapy, denies pain. Wife present for session. Total assist w/c transport to therapy gym and donned RAFO total assist. Made multiple attempts to ambulate w/ RW, however w/ decreased motor planning and initiation compared to previous sessions with this therapist. Required max manual and tactile cues for RLE advancement and step placement, unable to achieve full R knee extension in stance despite max assist, suspect increased R hamstring tone this session. Was able to ambulate 10' w/ max assist for balance and RLE step placement. Educated wife on use of RAFO including benefits and evaluating for use at d/c, will continue to assess impact on pt's gait if appropriate. Pt requesting to go outside for part of therapy, worked on self-propelling w/c w/ L hemi technique requiring pt  to negotiate thresholds, bumps, uneven surfaces outside, and busier environments. Able to self-propel 150-200' at a time before a brief rest break. Returned to unit and performed NuStep 10 min @ L2 to facilitate reciprocal movement pattern. Returned to room and ended session in recliner, in  care of wife and all needs met. QRB activated.   Educated both pt and wife on benefits of w/c mobility throughout session, especially increased independence, in anticipation of d/c home. Multiple times, wife stated that pt needed to get up and move and that he needs to be moving at home, implying that pt needs to be ambulatory at home. Educated both on pt's CLOF and expectations that pt will likely required skilled assist when ambulating at discharge. Discussed performing most mobility at w/c level for safety w/ expectation that pt will continue to improve in function when discharged. Wife appeared very anxious and nervous about taking pt home, will continue to educate. Provided w/ home measurement sheet.   Therapy Documentation Precautions:  Precautions Precautions: Fall Precaution Comments: apraxic, R hemi, R inattention Restrictions Weight Bearing Restrictions: No Vital Signs: Therapy Vitals Pulse Rate: 83 Resp: 18 BP: (!) 150/83 Patient Position (if appropriate): Lying Oxygen Therapy SpO2: 98 % O2 Device: Room Air  See Function Navigator for Current Functional Status.  Therapy/Group: Individual Therapy  Tajuan Dufault K Arnette 06/04/2017, 4:34 PM

## 2017-06-04 NOTE — Progress Notes (Signed)
Occupational Therapy Weekly Progress Note  Patient Details  Name: Edwin Jones MRN: [id number] Date of Birth: Mar 09, 1941  Beginning of progress report period: May 21, 2017 End of progress report period: June 04, 2017  Today's Date: 06/04/2017 OT Individual Time: 1100-1200 OT Individual Time Calculation (min): 60 min   Patient has met 4 of 4 short term goals.  Pt is making steady progress towards OT goals at this time. Pt is progressing within BADL tasks to an overall min/mod A level. He can perform transfers with as little as min A, but needs Mod A when fatigued. Improved sit<>stand and functional use of R UE.   Patient continues to demonstrate the following deficits: muscle weakness, impaired timing and sequencing, unbalanced muscle activation, motor apraxia, ataxia, decreased coordination and decreased motor planning, decreased attention to right, right side neglect and decreased motor planning, decreased initiation, decreased attention, decreased awareness, decreased problem solving, decreased safety awareness, decreased memory and delayed processing and decreased sitting balance, decreased standing balance, decreased postural control, hemiplegia and decreased balance strategies and therefore will continue to benefit from skilled OT intervention to enhance overall performance with BADL and Reduce care partner burden.  Patient progressing toward long term goals..  Continue plan of care.  OT Short Term Goals Week 2:  OT Short Term Goal 1 (Week 2): Pt will demonstrate improved dynamic sitting balance by donning pants over R leg from a seated position with Min A OT Short Term Goal 1 - Progress (Week 2): Met OT Short Term Goal 2 (Week 2): Pt will demonstrate improved activity tolerance to stand at the sink with min A to complete oral care. OT Short Term Goal 2 - Progress (Week 2): Met OT Short Term Goal 3 (Week 2): Pt will complete toilet transfer with consistent Mod A OT Short Term Goal 3  - Progress (Week 2): Met OT Short Term Goal 4 (Week 2): Pt will complete 1/3 toileting steps with min A for balance if needed OT Short Term Goal 4 - Progress (Week 2): Met Week 3:  OT Short Term Goal 1 (Week 3): STG=LTG 2/2 ELOS  Skilled Therapeutic Interventions/Progress Updates:   OT treatment session focused on modified bathing/dressing, improved sit<>stand, R attention, and functional use of R UE.  Pt supine in bed upon OT arrival and agreeable to OT. Squat-pivot bed> wc with Mod A and facilitation for head/dhips relationship. Asked pt if he needed to go to the bathroom and he shook his head no. Stand-pivot into shower with heavy use of grab bars with Mod A and facilitation at trunk and hips to elicit pivot. Once seated on tub bench, pt incontinent of urine Bathing completed with mod cues to integrate R UE into bathing tasks. Pt able to grasp wash cloth and utilize R UE for ~30% of bathing tasks. Worked on problem solving, initiation, and forced use of R UE within dressing tasks. Instructional cues for hemi techniques to don pants, but pt able to thread B LEs with increased time to problem solve. Pt left seated in wc at end of session with spouse present, safety belt on, and needs met.   Therapy Documentation Precautions:  Precautions Precautions: Fall Precaution Comments: apraxic, R hemi, R inattention Restrictions Weight Bearing Restrictions: No  See Function Navigator for Current Functional Status.   Therapy/Group: Individual Therapy  Valma Cava 06/04/2017, 6:48 AM

## 2017-06-04 NOTE — Progress Notes (Signed)
Subjective/Complaints: Remains aphasic and apraxic, participates in therapy   ROS: ? Y/N accuracy   Objective: Vital Signs: Blood pressure (!) 135/93, pulse 85, temperature 97.9 F (36.6 C), temperature source Oral, resp. rate 19, height 6\' 3"  (1.905 m), weight 90.2 kg (198 lb 13.7 oz), SpO2 97 %. No results found. Results for orders placed or performed during the hospital encounter of 05/20/17 (from the past 72 hour(s))  Glucose, capillary     Status: Abnormal   Collection Time: 06/01/17 12:07 PM  Result Value Ref Range   Glucose-Capillary 179 (H) 65 - 99 mg/dL  Glucose, capillary     Status: Abnormal   Collection Time: 06/01/17  4:34 PM  Result Value Ref Range   Glucose-Capillary 246 (H) 65 - 99 mg/dL  Glucose, capillary     Status: Abnormal   Collection Time: 06/01/17  9:01 PM  Result Value Ref Range   Glucose-Capillary 267 (H) 65 - 99 mg/dL  Glucose, capillary     Status: Abnormal   Collection Time: 06/02/17  6:45 AM  Result Value Ref Range   Glucose-Capillary 204 (H) 65 - 99 mg/dL  Glucose, capillary     Status: Abnormal   Collection Time: 06/02/17 11:27 AM  Result Value Ref Range   Glucose-Capillary 230 (H) 65 - 99 mg/dL  Glucose, capillary     Status: Abnormal   Collection Time: 06/02/17  4:37 PM  Result Value Ref Range   Glucose-Capillary 181 (H) 65 - 99 mg/dL  Glucose, capillary     Status: Abnormal   Collection Time: 06/02/17  9:02 PM  Result Value Ref Range   Glucose-Capillary 220 (H) 65 - 99 mg/dL  Glucose, capillary     Status: Abnormal   Collection Time: 06/03/17  6:43 AM  Result Value Ref Range   Glucose-Capillary 178 (H) 65 - 99 mg/dL  Glucose, capillary     Status: Abnormal   Collection Time: 06/03/17 11:59 AM  Result Value Ref Range   Glucose-Capillary 238 (H) 65 - 99 mg/dL  Glucose, capillary     Status: Abnormal   Collection Time: 06/03/17  4:39 PM  Result Value Ref Range   Glucose-Capillary 167 (H) 65 - 99 mg/dL   Comment 1 Notify RN    Glucose, capillary     Status: Abnormal   Collection Time: 06/03/17  9:06 PM  Result Value Ref Range   Glucose-Capillary 211 (H) 65 - 99 mg/dL   Comment 1 Notify RN   Glucose, capillary     Status: Abnormal   Collection Time: 06/04/17  7:00 AM  Result Value Ref Range   Glucose-Capillary 164 (H) 65 - 99 mg/dL   Comment 1 Notify RN      HEENT: poor dentition Cardio:RRR without murmur. No JVD  Resp: CTA Bilaterally without wheezes or rales. Normal effort  GI: BS positive and non tender Extremity:  No Edema Skin:   Intact and Other portacath site intact Neuro: lethargic and aphasic.  Abnormal Motor 3/5 RUE, 4/5 RLE , 5/5 on Left side,  Right inattention.  Musc/Skel:  Other no pain with UE or LE ROM Gen NAD   Assessment/Plan: 1. 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 Physiatrist is providing close team supervision and 24 hour management of active medical problems listed below. Physiatrist and rehab team continue to assess barriers to discharge/monitor patient progress toward functional and medical goals. FIM: Function -  Bathing Position: Sitting EOB Body parts bathed by patient: Right arm, Left arm, Chest, Abdomen, Front perineal area, Left lower leg, Right lower leg, Left upper leg, Right upper leg Body parts bathed by helper: Back, Buttocks Assist Level: Touching or steadying assistance(Pt > 75%)  Function- Upper Body Dressing/Undressing What is the patient wearing?: Pull over shirt/dress Pull over shirt/dress - Perfomed by patient: Put head through opening, Thread/unthread left sleeve, Thread/unthread right sleeve Pull over shirt/dress - Perfomed by helper: Pull shirt over trunk Assist Level: Supervision or verbal cues Function - Lower Body Dressing/Undressing What is the patient wearing?: Underwear, Pants, Shoes Position: Other (comment)(on  toilet) Underwear - Performed by patient: Pull underwear up/down Underwear - Performed by helper: Thread/unthread right underwear leg, Thread/unthread left underwear leg Pants- Performed by patient: Pull pants up/down Pants- Performed by helper: Thread/unthread right pants leg, Thread/unthread left pants leg Non-skid slipper socks- Performed by helper: Don/doff right sock, Don/doff left sock Socks - Performed by helper: Don/doff right sock, Don/doff left sock Shoes - Performed by patient: Don/doff left shoe Shoes - Performed by helper: Don/doff right shoe, Fasten right, Fasten left, Don/doff left shoe Assist for footwear: Partial/moderate assist Assist for lower body dressing: Touching or steadying assistance (Pt > 75%)  Function - Toileting Toileting activity did not occur: No continent bowel/bladder event Toileting steps completed by helper: Adjust clothing prior to toileting, Performs perineal hygiene, Adjust clothing after toileting Toileting Assistive Devices: Other (comment)(stand up from wheelchair with aid of walker) Assist level: Touching or steadying assistance (Pt.75%)  Function - Toilet Transfers Toilet transfer activity did not occur: N/A Toilet transfer assistive device: Environmental manager lift: Stedy Assist level to toilet: Moderate assist (Pt 50 - 74%/lift or lower) Assist level from toilet: Moderate assist (Pt 50 - 74%/lift or lower)  Function - Chair/bed transfer Chair/bed transfer method: Stand pivot Chair/bed transfer assist level: Touching or steadying assistance (Pt > 75%) Chair/bed transfer assistive device: Armrests Mechanical lift: Stedy Chair/bed transfer details: Manual facilitation for weight shifting, Manual facilitation for weight bearing, Verbal cues for technique  Function - Locomotion: Wheelchair Will patient use wheelchair at discharge?: Yes Type: Manual Max wheelchair distance: 150' Assist Level: Supervision or verbal cues Assist Level:  Supervision or verbal cues Assist Level: Supervision or verbal cues Turns around,maneuvers to table,bed, and toilet,negotiates 3% grade,maneuvers on rugs and over doorsills: No Function - Locomotion: Ambulation Assistive device: Walker-rolling, Orthosis(RUE orthosis, R AFO) Max distance: 20' Assist level: Touching or steadying assistance (Pt > 75%) Assist level: Touching or steadying assistance (Pt > 75%) Walk 50 feet with 2 turns activity did not occur: Safety/medical concerns Walk 150 feet activity did not occur: Safety/medical concerns Walk 10 feet on uneven surfaces activity did not occur: Safety/medical concerns  Function - Comprehension Comprehension: Auditory Comprehension assist level: Understands basic 50 - 74% of the time/ requires cueing 25 - 49% of the time  Function - Expression Expression: Verbal Expression assist level: Expresses basis less than 25% of the time/requires cueing >75% of the time.  Function - Social Interaction Social Interaction assist level: Interacts appropriately 50 - 74% of the time - May be physically or verbally inappropriate.  Function - Problem Solving Problem solving assist level: Solves basic 25 - 49% of the time - needs direction more than half the time to initiate, plan or complete simple activities  Function - Memory Memory assist level: Recognizes or recalls less than 25% of the time/requires cueing greater than 75% of the time Patient normally able to recall (first  3 days only): That he or she is in a hospital, Staff names and faces  Medical Problem List and Plan: 1.   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  2. DVT Prophylaxis/Anticoagulation: Pharmaceutical:Heparin--continue 3. Pain Management:tylenol prn 4. Mood:LCSW to follow for evaluation when appropriate. 5. Neuropsych: This patientis notcapable of making decisions on hisown behalf. 6. Skin/Wound Care:routine  pressure relief measures. 7. Fluids/Electrolytes/Nutrition:Monitor I/O. Continue to offer supplements between meals. 8.Hypertension:Hypotensionhas resolved and patient offMidodrine and Florinef Vitals:   06/03/17 1401 06/04/17 0405  BP: 131/80 (!) 135/93  Pulse: 80 85  Resp: 18 19  Temp: (!) 97.5 F (36.4 C) 97.9 F (36.6 C)  SpO2: 100% 97%  Controlled 3/29 9. 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 at baseline BMP Latest Ref Rng & Units 05/28/2017 05/21/2017 05/20/2017  Glucose 65 - 99 mg/dL 205(H) 198(H) 171(H)  BUN 6 - 20 mg/dL 38(H) 20 15  Creatinine 0.61 - 1.24 mg/dL 1.90(H) 1.75(H) 1.87(H)  Sodium 135 - 145 mmol/L 132(L) 134(L) 135  Potassium 3.5 - 5.1 mmol/L 4.8 4.5 4.3  Chloride 101 - 111 mmol/L 98(L) 102 104  CO2 22 - 32 mmol/L 21(L) 21(L) 22  Calcium 8.9 - 10.3 mg/dL 9.2 8.9 8.6(L)   10. ABLA: Monitor H/H for signs of bleedinghas been in 10- 11 range. 11 on 3/15  12. Lethargy: CVA related Trial Ritalin with some improvement in arousal noted, no tachycard will titrate up, discuss with team  13. Oral thrush:  nystatin mouthwash as well as oral care every 4 hours while awake. 14. COPD/Left lung cancer: On Breo with albuterol prn.  15.DM: poorly controlled     -SSI   -added low dose amaryl on 3/16, increase to 4mg  on 3/22-monitor, not a good candidate for Metformin due to CKD CBG (last 3)  Recent Labs    06/03/17 1639 06/03/17 2106 06/04/17 0700  GLUCAP 167* 211* 164*   lantus,20U  Control improving 3/29    LOS (Days) 15 A FACE TO FACE EVALUATION WAS PERFORMED  Edwin Jones 06/04/2017, 7:16 AM

## 2017-06-04 NOTE — Plan of Care (Signed)
  Problem: RH BLADDER ELIMINATION Goal: RH STG MANAGE BLADDER WITH ASSISTANCE Description STG Manage Bladder With mod Assistance  Outcome: Progressing Flowsheets (Taken 06/04/2017 1229) STG: Pt will manage bladder with assistance: 1-Total assistance   Problem: RH SKIN INTEGRITY Goal: RH STG SKIN FREE OF INFECTION/BREAKDOWN Description Skin free of infection/breakdown with min assistance  Outcome: Progressing Goal: RH STG MAINTAIN SKIN INTEGRITY WITH ASSISTANCE Description STG Maintain Skin Integrity With min Assistance.  Outcome: Progressing Flowsheets (Taken 06/04/2017 1229) STG: Maintain skin integrity with assistance: 4-Minimal assistance   Problem: RH SAFETY Goal: RH STG ADHERE TO SAFETY PRECAUTIONS W/ASSISTANCE/DEVICE Description STG Adhere to Safety Precautions With min  Assistance/Device.  Outcome: Progressing Flowsheets (Taken 06/04/2017 1229) STG:Pt will adhere to safety precautions with assistance/device: 3-Moderate assistance Goal: RH STG DECREASED RISK OF FALL WITH ASSISTANCE Description STG Decreased Risk of Fall With min Assistance.  Outcome: Progressing Flowsheets (Taken 06/04/2017 1229) HTD:[id number] risk of fall  with assistance/device: 3-Moderate assistance

## 2017-06-04 NOTE — Progress Notes (Signed)
Speech Language Pathology Weekly Progress and Session Note  Patient Details  Name: Edwin Jones MRN: [id number] Date of Birth: 1941-01-01  Beginning of progress report period: May 28, 2017 End of progress report period: June 04, 2017  Today's Date: 06/04/2017 SLP Individual Time: 0830-0930 SLP Individual Time Calculation (min): 60 min  Short Term Goals: Week 2: SLP Short Term Goal 1 (Week 2): Pt will sustain attention to task for ~ 30 minutes with Mod A cues.  SLP Short Term Goal 1 - Progress (Week 2): Met SLP Short Term Goal 2 (Week 2): Pt will select requested object in field of 2 with Mod A cues in 8 out of 10 opportunities.  SLP Short Term Goal 2 - Progress (Week 2): Met SLP Short Term Goal 3 (Week 2): Pt will name common object in 8 of 10 opportunities with Mod A cues.  SLP Short Term Goal 3 - Progress (Week 2): Met SLP Short Term Goal 4 (Week 2): Pt will follow 1 step simple directions in 8 out of 10 opportunities with Mod A cues.  SLP Short Term Goal 4 - Progress (Week 2): Met SLP Short Term Goal 5 (Week 2): Pt will answer basic yes/no question in 5 out of 10 opportunities with Mod A cues.  SLP Short Term Goal 5 - Progress (Week 2): Met SLP Short Term Goal 6 (Week 2): Pt will consume dysphagia 2 diet with thin liquids and minimal overt s/s of aspiration and Min A cues for use of compensatory swallow strategies.  SLP Short Term Goal 6 - Progress (Week 2): Met    New Short Term Goals: Week 3: SLP Short Term Goal 1 (Week 3): Pt will sustain attention to task for ~ 30 minutes with Min A verbal cues.  SLP Short Term Goal 2 (Week 3): Pt will decode at the sentence level with 80% accuracy with Mod A verbal and visual cues.  SLP Short Term Goal 3 (Week 3): Pt will demonstrate word-finding in 7 out of 10 opportunities with Max A verbal cues.  SLP Short Term Goal 4 (Week 3): Pt will follow 1-step simple directions in 8 out of 10 opportunities with Supervision A verbal cues.  SLP  Short Term Goal 5 (Week 3): Pt will answer basic yes/no question in 8 out of 10 opportunities with Min A cues.  SLP Short Term Goal 6 (Week 3): Pt will consume trials of dysphagia 3 textures with efficient mastication and minimal oral residue with Min A verbal cues for use of compensatory swallowing strategies over 2 sessions prior to upgrade.   Weekly Progress Updates: Pt continues to make functional gains and has met 6 out of 6 STG's this reporting period. Currently, pt requires overall Supervision verbal cues to name common objects with 100% accuracy and Mod A verbal cues to follow 1-step basic directions in 5 out of 10 opportunities. Pt is able to answer basic yes/no questions with 70% accuracy. Given Mod A verbal cues, pt demonstrates sustained attention to tasks for ~30 minutes. Pt currently consumes dys. 2 textures with thin liquids without overt s/s of aspiration and overall Min A verbal cues for use of compensatory strategies. Pt is also consuming trials of dys.3 textures, but demonstrates prolonged mastication and requires Min A verbal cues to clear oral residue. Recommend continue trials with SLP. Patient and family education is ongoing. Patient would benefit from continued skilled SLP intervention to maximize his swallowing and communication function and overall functional independence prior to discharge.  Intensity: Minumum of 1-2 x/day, 30 to 90 minutes Frequency: 3 to 5 out of 7 days Duration/Length of Stay: 06/15/17 Treatment/Interventions: Cueing hierarchy;Functional tasks;Patient/family education;Therapeutic Activities;Speech/Language facilitation;Dysphagia/aspiration precaution training;Multimodal communication approach;Cognitive remediation/compensation;Internal/external aids   Daily Session  Skilled Therapeutic Interventions: Skilled treatment session focused on dysphagia and communication goals. SLP facilitated session by administering trials of dys. 3 textures. Pt consumed  graham cracker without overt s/s of aspiration and required Min A verbal cues to self-monitor right pocketing. However, pt continues to demonstrate slow mastication. Therefore, recommend continue trials of dys. 3 textures with SLP. SLP also facilitated session by providing Max A verbal and visual cues for pt to decode at the sentence level with 80% accuracy. Pt able to name common objects with 100% accuracy with Supervision verbal cues and answer basic yes/no questions with 80% accuracy with Min A verbal cues. Pt demonstrated word-finding during a wh-questions task with 60% accuracy with Max A verbal cues. Pt left upright in bed with alarm on and all needs within reach. Continue with current plan of care.    Function:   Cognition Comprehension Comprehension assist level: Understands basic 50 - 74% of the time/ requires cueing 25 - 49% of the time  Expression   Expression assist level: Expresses basis less than 25% of the time/requires cueing >75% of the time.  Social Interaction Social Interaction assist level: Interacts appropriately 75 - 89% of the time - Needs redirection for appropriate language or to initiate interaction.  Problem Solving Problem solving assist level: Solves basic 25 - 49% of the time - needs direction more than half the time to initiate, plan or complete simple activities  Memory Memory assist level: Recognizes or recalls 25 - 49% of the time/requires cueing 50 - 75% of the time     Pain Pain Assessment: No/denies pain  Therapy/Group: Individual Therapy  Meredeth Ide  SLP - Student 06/04/2017, 3:23 PM

## 2017-06-04 NOTE — Progress Notes (Signed)
Subjective/Complaints: Remains aphasic and apraxic, incontinent of bladder, changed by CNA   ROS: ? Y/N accuracy   Objective: Vital Signs: Blood pressure (!) 135/93, pulse 85, temperature 97.9 F (36.6 C), temperature source Oral, resp. rate 19, height 6\' 3"  (1.905 m), weight 90.2 kg (198 lb 13.7 oz), SpO2 97 %. No results found. Results for orders placed or performed during the hospital encounter of 05/20/17 (from the past 72 hour(s))  Glucose, capillary     Status: Abnormal   Collection Time: 06/01/17 12:07 PM  Result Value Ref Range   Glucose-Capillary 179 (H) 65 - 99 mg/dL  Glucose, capillary     Status: Abnormal   Collection Time: 06/01/17  4:34 PM  Result Value Ref Range   Glucose-Capillary 246 (H) 65 - 99 mg/dL  Glucose, capillary     Status: Abnormal   Collection Time: 06/01/17  9:01 PM  Result Value Ref Range   Glucose-Capillary 267 (H) 65 - 99 mg/dL  Glucose, capillary     Status: Abnormal   Collection Time: 06/02/17  6:45 AM  Result Value Ref Range   Glucose-Capillary 204 (H) 65 - 99 mg/dL  Glucose, capillary     Status: Abnormal   Collection Time: 06/02/17 11:27 AM  Result Value Ref Range   Glucose-Capillary 230 (H) 65 - 99 mg/dL  Glucose, capillary     Status: Abnormal   Collection Time: 06/02/17  4:37 PM  Result Value Ref Range   Glucose-Capillary 181 (H) 65 - 99 mg/dL  Glucose, capillary     Status: Abnormal   Collection Time: 06/02/17  9:02 PM  Result Value Ref Range   Glucose-Capillary 220 (H) 65 - 99 mg/dL  Glucose, capillary     Status: Abnormal   Collection Time: 06/03/17  6:43 AM  Result Value Ref Range   Glucose-Capillary 178 (H) 65 - 99 mg/dL  Glucose, capillary     Status: Abnormal   Collection Time: 06/03/17 11:59 AM  Result Value Ref Range   Glucose-Capillary 238 (H) 65 - 99 mg/dL  Glucose, capillary     Status: Abnormal   Collection Time: 06/03/17  4:39 PM  Result Value Ref Range   Glucose-Capillary 167 (H) 65 - 99 mg/dL   Comment 1  Notify RN   Glucose, capillary     Status: Abnormal   Collection Time: 06/03/17  9:06 PM  Result Value Ref Range   Glucose-Capillary 211 (H) 65 - 99 mg/dL   Comment 1 Notify RN   Glucose, capillary     Status: Abnormal   Collection Time: 06/04/17  7:00 AM  Result Value Ref Range   Glucose-Capillary 164 (H) 65 - 99 mg/dL   Comment 1 Notify RN      HEENT: poor dentition Cardio:RRR without murmur. No JVD  Resp: CTA Bilaterally without wheezes or rales. Normal effort  GI: BS positive and non tender Extremity:  No Edema Skin:   Intact and Other portacath site intact Neuro: lethargic and aphasic.  Abnormal Motor 3/5 RUE, 4/5 RLE , 5/5 on Left side,  Right inattention.  Musc/Skel:  Other no pain with UE or LE ROM Gen NAD   Assessment/Plan: 1. 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 Physiatrist is providing close team supervision and 24 hour management of active medical problems listed below. Physiatrist and rehab team continue to assess barriers to discharge/monitor patient progress toward functional and medical goals.  FIM: Function - Bathing Position: Sitting EOB Body parts bathed by patient: Right arm, Left arm, Chest, Abdomen, Front perineal area, Left lower leg, Right lower leg, Left upper leg, Right upper leg Body parts bathed by helper: Back, Buttocks Assist Level: Touching or steadying assistance(Pt > 75%)  Function- Upper Body Dressing/Undressing What is the patient wearing?: Pull over shirt/dress Pull over shirt/dress - Perfomed by patient: Put head through opening, Thread/unthread left sleeve, Thread/unthread right sleeve Pull over shirt/dress - Perfomed by helper: Pull shirt over trunk Assist Level: Supervision or verbal cues Function - Lower Body Dressing/Undressing What is the patient wearing?: Underwear, Pants, Shoes Position: Other  (comment)(on toilet) Underwear - Performed by patient: Pull underwear up/down Underwear - Performed by helper: Thread/unthread right underwear leg, Thread/unthread left underwear leg Pants- Performed by patient: Pull pants up/down Pants- Performed by helper: Thread/unthread right pants leg, Thread/unthread left pants leg Non-skid slipper socks- Performed by helper: Don/doff right sock, Don/doff left sock Socks - Performed by helper: Don/doff right sock, Don/doff left sock Shoes - Performed by patient: Don/doff left shoe Shoes - Performed by helper: Don/doff right shoe, Fasten right, Fasten left, Don/doff left shoe Assist for footwear: Partial/moderate assist Assist for lower body dressing: Touching or steadying assistance (Pt > 75%)  Function - Toileting Toileting activity did not occur: No continent bowel/bladder event Toileting steps completed by helper: Adjust clothing prior to toileting, Performs perineal hygiene, Adjust clothing after toileting Toileting Assistive Devices: Other (comment)(stand up from wheelchair with aid of walker) Assist level: Touching or steadying assistance (Pt.75%)  Function - Toilet Transfers Toilet transfer activity did not occur: N/A Toilet transfer assistive device: Environmental manager lift: Stedy Assist level to toilet: Moderate assist (Pt 50 - 74%/lift or lower) Assist level from toilet: Moderate assist (Pt 50 - 74%/lift or lower)  Function - Chair/bed transfer Chair/bed transfer method: Stand pivot Chair/bed transfer assist level: Touching or steadying assistance (Pt > 75%) Chair/bed transfer assistive device: Armrests Mechanical lift: Stedy Chair/bed transfer details: Manual facilitation for weight shifting, Manual facilitation for weight bearing, Verbal cues for technique  Function - Locomotion: Wheelchair Will patient use wheelchair at discharge?: Yes Type: Manual Max wheelchair distance: 150' Assist Level: Supervision or verbal cues Assist  Level: Supervision or verbal cues Assist Level: Supervision or verbal cues Turns around,maneuvers to table,bed, and toilet,negotiates 3% grade,maneuvers on rugs and over doorsills: No Function - Locomotion: Ambulation Assistive device: Walker-rolling, Orthosis(RUE orthosis, R AFO) Max distance: 20' Assist level: Touching or steadying assistance (Pt > 75%) Assist level: Touching or steadying assistance (Pt > 75%) Walk 50 feet with 2 turns activity did not occur: Safety/medical concerns Walk 150 feet activity did not occur: Safety/medical concerns Walk 10 feet on uneven surfaces activity did not occur: Safety/medical concerns  Function - Comprehension Comprehension: Auditory Comprehension assist level: Understands basic 50 - 74% of the time/ requires cueing 25 - 49% of the time  Function - Expression Expression: Verbal Expression assist level: Expresses basis less than 25% of the time/requires cueing >75% of the time.  Function - Social Interaction Social Interaction assist level: Interacts appropriately 50 - 74% of the time - May be physically or verbally inappropriate.  Function - Problem Solving Problem solving assist level: Solves basic 25 - 49% of the time - needs direction more than half the time to initiate, plan or complete simple activities  Function - Memory Memory assist level: Recognizes or recalls less than 25% of the time/requires cueing greater than 75% of the time Patient normally able  to recall (first 3 days only): That he or she is in a hospital, Staff names and faces  Medical Problem List and Plan: 1.   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  2. DVT Prophylaxis/Anticoagulation: Pharmaceutical:Heparin--continue 3. Pain Management:tylenol prn 4. Mood:LCSW to follow for evaluation when appropriate. 5. Neuropsych: This patientis notcapable of making decisions on hisown behalf. 6. Skin/Wound Care:routine  pressure relief measures. 7. Fluids/Electrolytes/Nutrition:Monitor I/O. Continue to offer supplements between meals. 8.Hypertension:Hypotensionhas resolved and patient offMidodrine and Florinef Vitals:   06/03/17 1401 06/04/17 0405  BP: 131/80 (!) 135/93  Pulse: 80 85  Resp: 18 19  Temp: (!) 97.5 F (36.4 C) 97.9 F (36.6 C)  SpO2: 100% 97%  Controlled 3/29 9. 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 at baseline BMP Latest Ref Rng & Units 05/28/2017 05/21/2017 05/20/2017  Glucose 65 - 99 mg/dL 205(H) 198(H) 171(H)  BUN 6 - 20 mg/dL 38(H) 20 15  Creatinine 0.61 - 1.24 mg/dL 1.90(H) 1.75(H) 1.87(H)  Sodium 135 - 145 mmol/L 132(L) 134(L) 135  Potassium 3.5 - 5.1 mmol/L 4.8 4.5 4.3  Chloride 101 - 111 mmol/L 98(L) 102 104  CO2 22 - 32 mmol/L 21(L) 21(L) 22  Calcium 8.9 - 10.3 mg/dL 9.2 8.9 8.6(L)   10. ABLA: Monitor H/H for signs of bleedinghas been in 10- 11 range. 11 on 3/15  12. Lethargy: CVA related Trial Ritalin with some improvement in arousal noted, no tachycard will titrate up, discuss with team  13. Oral thrush:  nystatin mouthwash as well as oral care every 4 hours while awake. 14. COPD/Left lung cancer: On Breo with albuterol prn.  15.DM: poorly controlled     -SSI   -added low dose amaryl on 3/16, increase to 4mg  on 3/22-monitor, not a good candidate for Metformin due to CKD CBG (last 3)  Recent Labs    06/03/17 1639 06/03/17 2106 06/04/17 0700  GLUCAP 167* 211* 164*  Controlled  lantus,20U LOS (Days) 15 A FACE TO FACE EVALUATION WAS PERFORMED  Luanna Salk Kirsteins 06/04/2017, 7:02 AM

## 2017-06-05 ENCOUNTER — Inpatient Hospital Stay (HOSPITAL_COMMUNITY): Payer: Medicare Other | Admitting: Physical Therapy

## 2017-06-05 ENCOUNTER — Inpatient Hospital Stay (HOSPITAL_COMMUNITY): Payer: Medicare Other

## 2017-06-05 LAB — GLUCOSE, CAPILLARY
GLUCOSE-CAPILLARY: 189 mg/dL — AB (ref 65–99)
GLUCOSE-CAPILLARY: 189 mg/dL — AB (ref 65–99)
GLUCOSE-CAPILLARY: 197 mg/dL — AB (ref 65–99)
Glucose-Capillary: 184 mg/dL — ABNORMAL HIGH (ref 65–99)

## 2017-06-05 NOTE — Plan of Care (Signed)
  Problem: RH BLADDER ELIMINATION Goal: RH STG MANAGE BLADDER WITH ASSISTANCE Description STG Manage Bladder With mod Assistance ; pt is incont. 06/05/2017 1451 by Greer Ee, LPN Outcome: Not Progressing 06/05/2017 1449 by Greer Ee, LPN Outcome: Progressing

## 2017-06-05 NOTE — Progress Notes (Signed)
Subjective/Complaints: Aphasic, awake , limited Y/N response   ROS: ? Y/N accuracy   Objective: Vital Signs: Blood pressure (!) 158/83, pulse 80, temperature 98.3 F (36.8 C), temperature source Oral, resp. rate 18, height '6\' 3"'$  (1.905 m), weight 90.2 kg (198 lb 13.7 oz), SpO2 99 %. No results found. Results for orders placed or performed during the hospital encounter of 05/20/17 (from the past 72 hour(s))  Glucose, capillary     Status: Abnormal   Collection Time: 06/02/17 11:27 AM  Result Value Ref Range   Glucose-Capillary 230 (H) 65 - 99 mg/dL  Glucose, capillary     Status: Abnormal   Collection Time: 06/02/17  4:37 PM  Result Value Ref Range   Glucose-Capillary 181 (H) 65 - 99 mg/dL  Glucose, capillary     Status: Abnormal   Collection Time: 06/02/17  9:02 PM  Result Value Ref Range   Glucose-Capillary 220 (H) 65 - 99 mg/dL  Glucose, capillary     Status: Abnormal   Collection Time: 06/03/17  6:43 AM  Result Value Ref Range   Glucose-Capillary 178 (H) 65 - 99 mg/dL  Glucose, capillary     Status: Abnormal   Collection Time: 06/03/17 11:59 AM  Result Value Ref Range   Glucose-Capillary 238 (H) 65 - 99 mg/dL  Glucose, capillary     Status: Abnormal   Collection Time: 06/03/17  4:39 PM  Result Value Ref Range   Glucose-Capillary 167 (H) 65 - 99 mg/dL   Comment 1 Notify RN   Glucose, capillary     Status: Abnormal   Collection Time: 06/03/17  9:06 PM  Result Value Ref Range   Glucose-Capillary 211 (H) 65 - 99 mg/dL   Comment 1 Notify RN   Glucose, capillary     Status: Abnormal   Collection Time: 06/04/17  7:00 AM  Result Value Ref Range   Glucose-Capillary 164 (H) 65 - 99 mg/dL   Comment 1 Notify RN   Basic metabolic panel     Status: Abnormal   Collection Time: 06/04/17  8:14 AM  Result Value Ref Range   Sodium 132 (L) 135 - 145 mmol/L   Potassium 5.0 3.5 - 5.1 mmol/L   Chloride 100 (L) 101 - 111 mmol/L   CO2 20 (L) 22 - 32 mmol/L   Glucose, Bld 188 (H) 65 -  99 mg/dL   BUN 39 (H) 6 - 20 mg/dL   Creatinine, Ser 1.94 (H) 0.61 - 1.24 mg/dL   Calcium 9.6 8.9 - 10.3 mg/dL   GFR calc non Af Amer 32 (L) >60 mL/min   GFR calc Af Amer 37 (L) >60 mL/min    Comment: (NOTE) The eGFR has been calculated using the CKD EPI equation. This calculation has not been validated in all clinical situations. eGFR's persistently <60 mL/min signify possible Chronic Kidney Disease.    Anion gap 12 5 - 15    Comment: Performed at [facility] [address] [place], [place] [address]  Glucose, capillary     Status: Abnormal   Collection Time: 06/04/17 12:06 PM  Result Value Ref Range   Glucose-Capillary 159 (H) 65 - 99 mg/dL   Comment 1 Notify RN   Glucose, capillary     Status: Abnormal   Collection Time: 06/04/17  4:16 PM  Result Value Ref Range   Glucose-Capillary 212 (H) 65 - 99 mg/dL  Glucose, capillary     Status: Abnormal   Collection Time: 06/04/17  9:47 PM  Result Value Ref Range   Glucose-Capillary 179 (H) 65 - 99 mg/dL  Glucose, capillary     Status: Abnormal   Collection Time: 06/05/17  6:51 AM  Result Value Ref Range   Glucose-Capillary 189 (H) 65 - 99 mg/dL     HEENT: poor dentition Cardio:RRR without murmur. No JVD  Resp: CTA Bilaterally without wheezes or rales. Normal effort  GI: BS positive and non tender Extremity:  No Edema Skin:   Intact and Other portacath site intact Neuro: lethargic and aphasic.  Abnormal Motor 3/5 RUE, 4/5 RLE , 5/5 on Left side,  Right inattention.  Musc/Skel:  Other no pain with UE or LE ROM Gen NAD   Assessment/Plan: 1. 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 Physiatrist is providing close team supervision and 24 hour management of active medical problems listed below. Physiatrist and rehab team continue to assess barriers to discharge/monitor patient progress  toward functional and medical goals. FIM: Function - Bathing Position: Sitting EOB Body parts bathed by patient: Right arm, Left arm, Chest, Abdomen, Front perineal area, Left lower leg, Right lower leg, Left upper leg, Right upper leg Body parts bathed by helper: Back, Buttocks Assist Level: Touching or steadying assistance(Pt > 75%)  Function- Upper Body Dressing/Undressing What is the patient wearing?: Pull over shirt/dress Pull over shirt/dress - Perfomed by patient: Put head through opening, Thread/unthread left sleeve, Thread/unthread right sleeve Pull over shirt/dress - Perfomed by helper: Pull shirt over trunk Assist Level: Supervision or verbal cues Function - Lower Body Dressing/Undressing What is the patient wearing?: Underwear, Pants, Shoes Position: Other (comment)(on toilet) Underwear - Performed by patient: Pull underwear up/down Underwear - Performed by helper: Thread/unthread right underwear leg, Thread/unthread left underwear leg Pants- Performed by patient: Pull pants up/down Pants- Performed by helper: Thread/unthread right pants leg, Thread/unthread left pants leg Non-skid slipper socks- Performed by helper: Don/doff right sock, Don/doff left sock Socks - Performed by helper: Don/doff right sock, Don/doff left sock Shoes - Performed by patient: Don/doff left shoe Shoes - Performed by helper: Don/doff right shoe, Fasten right, Fasten left, Don/doff left shoe Assist for footwear: Partial/moderate assist Assist for lower body dressing: Touching or steadying assistance (Pt > 75%)  Function - Toileting Toileting activity did not occur: No continent bowel/bladder event Toileting steps completed by helper: Adjust clothing prior to toileting, Performs perineal hygiene, Adjust clothing after toileting Toileting Assistive Devices: Other (comment)(stand up from wheelchair with aid of walker) Assist level: Touching or steadying assistance (Pt.75%)  Function - Toilet  Transfers Toilet transfer activity did not occur: N/A Toilet transfer assistive device: Environmental manager lift: Stedy Assist level to toilet: Moderate assist (Pt 50 - 74%/lift or lower) Assist level from toilet: Moderate assist (Pt 50 - 74%/lift or lower)  Function - Chair/bed transfer Chair/bed transfer method: Stand pivot Chair/bed transfer assist level: Moderate assist (Pt 50 - 74%/lift or lower) Chair/bed transfer assistive device: Armrests Mechanical lift: Stedy Chair/bed transfer details: Manual facilitation for weight shifting, Manual facilitation for weight bearing, Verbal cues for technique  Function - Locomotion: Wheelchair Will patient use wheelchair at discharge?: Yes Type: Manual Max wheelchair distance: 150' Assist Level: Supervision or verbal cues Assist Level: Supervision or verbal cues Assist Level: Supervision or verbal cues Turns around,maneuvers to table,bed, and toilet,negotiates 3% grade,maneuvers on rugs and over doorsills: No Function - Locomotion: Ambulation Assistive device: Walker-rolling, Orthosis(RUE orthosis, RAFO) Max distance: 10' Assist  level: Maximal assist (Pt 25 - 49%) Assist level: Maximal assist (Pt 25 - 49%) Walk 50 feet with 2 turns activity did not occur: Safety/medical concerns Walk 150 feet activity did not occur: Safety/medical concerns Walk 10 feet on uneven surfaces activity did not occur: Safety/medical concerns  Function - Comprehension Comprehension: Auditory Comprehension assist level: Understands basic 50 - 74% of the time/ requires cueing 25 - 49% of the time  Function - Expression Expression: Verbal Expression assist level: Expresses basis less than 25% of the time/requires cueing >75% of the time.  Function - Social Interaction Social Interaction assist level: Interacts appropriately 50 - 74% of the time - May be physically or verbally inappropriate.  Function - Problem Solving Problem solving assist level: Solves basic  25 - 49% of the time - needs direction more than half the time to initiate, plan or complete simple activities  Function - Memory Memory assist level: Recognizes or recalls less than 25% of the time/requires cueing greater than 75% of the time Patient normally able to recall (first 3 days only): That he or she is in a hospital, Staff names and faces  Medical Problem List and Plan: 1.   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  2. DVT Prophylaxis/Anticoagulation: Pharmaceutical:Heparin--continue 3. Pain Management:tylenol prn 4. Mood:LCSW to follow for evaluation when appropriate. 5. Neuropsych: This patientis notcapable of making decisions on hisown behalf. 6. Skin/Wound Care:routine pressure relief measures. 7. Fluids/Electrolytes/Nutrition:Monitor I/O. Continue to offer supplements between meals. 8.Hypertension:Hypotensionhas resolved and patient offMidodrine and Florinef Vitals:   06/04/17 1500 06/05/17 0502  BP: (!) 150/83 (!) 158/83  Pulse: 83 80  Resp:  18  Temp:  98.3 F (36.8 C)  SpO2:  99%  Controlled 3/29 9. 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 at baseline BMP Latest Ref Rng & Units 06/04/2017 05/28/2017 05/21/2017  Glucose 65 - 99 mg/dL 188(H) 205(H) 198(H)  BUN 6 - 20 mg/dL 39(H) 38(H) 20  Creatinine 0.61 - 1.24 mg/dL 1.94(H) 1.90(H) 1.75(H)  Sodium 135 - 145 mmol/L 132(L) 132(L) 134(L)  Potassium 3.5 - 5.1 mmol/L 5.0 4.8 4.5  Chloride 101 - 111 mmol/L 100(L) 98(L) 102  CO2 22 - 32 mmol/L 20(L) 21(L) 21(L)  Calcium 8.9 - 10.3 mg/dL 9.6 9.2 8.9   10. ABLA: Monitor H/H for signs of bleedinghas been in 10- 11 range. 11 on 3/15  12. Lethargy: CVA related Trial Ritalin with some improvement in arousal noted, no tachycard will titrate up, discuss with team  13. Oral thrush:  nystatin mouthwash as well as oral care every 4 hours while awake. 14. COPD/Left lung cancer: On Breo with albuterol  prn.  15.DM: poorly controlled     -SSI   -added low dose amaryl on 3/16, increase to '4mg'$  on 3/22-monitor, not a good candidate for Metformin due to CKD CBG (last 3)  Recent Labs    06/04/17 1616 06/04/17 2147 06/05/17 0651  GLUCAP 212* 179* 189*   Increase lantus,25U   3/30    LOS (Days) 16 A FACE TO FACE EVALUATION WAS PERFORMED  Luanna Salk Stephanieann Popescu 06/05/2017, 8:53 AM

## 2017-06-05 NOTE — Progress Notes (Signed)
Physical Therapy Session Note  Patient Details  Name: Edwin Jones MRN: [id number] Date of Birth: 07-13-1940  Today's Date: 06/05/2017 PT Individual Time: 1650-1710 PT Individual time calculation (min): 20 min  Short Term Goals: Week 3:  PT Short Term Goal 1 (Week 3): Pt will negotiate 1 curb w/ RW w/ mod assist  PT Short Term Goal 2 (Week 3): Pt will perform bed mobility w/o use of hospital bed functions w/ min assist PT Short Term Goal 3 (Week 3): Pt will ambulate 25' w/ LRAD w/ min assist  Skilled Therapeutic Interventions/Progress Updates:   Pt refusing therapy during scheduled time in morning. Pt in supine and refusing to participate 2/2 fatigue. Returned in afternoon to make up time from missed morning session. Family present to visit pt, wife present and asking multiple questions regarding home set-up. She reported dimensions of bathroom and bedroom doors, although states she did not bring back home measurement sheet today. Wife expressing many concerns regarding w/c mobility in house 2/2 size of doorways. Provided skilled education regarding accommodations that can be made at home to increase success w/ w/c mobility including moving furniture and using bedside commode. Discussed using smaller w/c width and therapy will assess feasibility and safety of a smaller w/c for better fit in doorways of home. Wife verbalized understanding and plans to bring back home measurement sheet that will have all measurements listed. Missed 10 min of skilled PT 2/2 refusal.   Therapy Documentation Precautions:  Precautions Precautions: Fall Precaution Comments: apraxic, R hemi, R inattention Restrictions Weight Bearing Restrictions: No Pain: Pain Assessment Pain Scale: 0-10 Pain Score: 0-No pain  See Function Navigator for Current Functional Status.   Therapy/Group: Individual Therapy  Pawel Soules K Arnette 06/05/2017, 9:58 PM

## 2017-06-05 NOTE — Progress Notes (Signed)
Occupational Therapy Session Note  Patient Details  Name: Edwin Jones MRN: [id number] Date of Birth: [birth year]  Today's Date: 06/05/2017 OT Individual Time: 1445-1530 OT Individual Time Calculation (min): 45 min    Short Term Goals: Week 2:  OT Short Term Goal 1 (Week 2): Pt will demonstrate improved dynamic sitting balance by donning pants over R leg from a seated position with Min A OT Short Term Goal 1 - Progress (Week 2): Met OT Short Term Goal 2 (Week 2): Pt will demonstrate improved activity tolerance to stand at the sink with min A to complete oral care. OT Short Term Goal 2 - Progress (Week 2): Met OT Short Term Goal 3 (Week 2): Pt will complete toilet transfer with consistent Mod A OT Short Term Goal 3 - Progress (Week 2): Met OT Short Term Goal 4 (Week 2): Pt will complete 1/3 toileting steps with min A for balance if needed OT Short Term Goal 4 - Progress (Week 2): Met  Skilled Therapeutic Interventions/Progress Updates:    1;1. Pt reporting need to toilet and no pain. Pt supine>sitting with MOD A for LE and trunk management. Pt sit to stand in stedy with MOD A lifting to transfer onto toilet. OT provided increased time for pt to process cues. Pt unsuccessful with BM, however incontinent of bladder in breif. OT donned new brief. Standing in stedy. Pt seated at sink to wash hands iht HOH A to incorporate RUE into crossing midline to reach for soap/lather hands and retrieve paper towel. In middle of task pt reaches with LUE for deodorant when trying to locate soap. Pt completes seated clothespin activity with R forearm weight bearing on high low table and crossing midline to retrieve clothespin with LUE and place on vertical pole to familitate reaching, R attention, and trunk rotation. Exited session with pt seated in w/c, call light in reach and QRB on. Therapy Documentation Precautions:  Precautions Precautions: Fall Precaution Comments: apraxic, R hemi, R  inattention Restrictions Weight Bearing Restrictions: No General:   Vital Signs: See Function Navigator for Current Functional Status.   Therapy/Group: Individual Therapy  Tonny Branch 06/05/2017, 3:32 PM

## 2017-06-06 ENCOUNTER — Inpatient Hospital Stay (HOSPITAL_COMMUNITY): Payer: Medicare Other

## 2017-06-06 LAB — GLUCOSE, CAPILLARY
GLUCOSE-CAPILLARY: 153 mg/dL — AB (ref 65–99)
GLUCOSE-CAPILLARY: 119 mg/dL — AB (ref 65–99)
GLUCOSE-CAPILLARY: 248 mg/dL — AB (ref 65–99)
Glucose-Capillary: 227 mg/dL — ABNORMAL HIGH (ref 65–99)

## 2017-06-06 NOTE — Progress Notes (Signed)
Physical Therapy Session Note  Patient Details  Name: Edwin Jones MRN: [id number] Date of Birth: 02/07/1941  Today's Date: 06/06/2017 PT Individual Time: 1545-1630 PT Individual Time Calculation (min): 45 min   Short Term Goals: Week 3:  PT Short Term Goal 1 (Week 3): Pt will negotiate 1 curb w/ RW w/ mod assist  PT Short Term Goal 2 (Week 3): Pt will perform bed mobility w/o use of hospital bed functions w/ min assist PT Short Term Goal 3 (Week 3): Pt will ambulate 25' w/ LRAD w/ min assist  Skilled Therapeutic Interventions/Progress Updates:    Pt supine in bed upon PT arrival,agreeable to therapy tx and denies pain. Pt transferred from supine>sitting EOB with min assist. Pt performed stand pivot transfer from bed>w/c mod assist, verbal cues for techniques. Pt transported to the gym in w/c. Pt ambulated x 30 ft with L railing, x 30 ft with L railing and R AFO, x 33 ft with RW and R AFO, verbal cues for upright posture, occasional manual facilitation for R hip flexion to initiate swing and verbal cues for increased R step length and awareness. After ambulation pt performed stand pivot transfers from chair<>w/c with mod assist and verbal cues for techniques. Wife present for session, asking questions regarding pt goals and d/c planning, therapist addressed questions and concerns. Pt transported back to room and left seated in w/c with QRB in place and needs in reach.   Therapy Documentation Precautions:  Precautions Precautions: Fall Precaution Comments: apraxic, R hemi, R inattention Restrictions Weight Bearing Restrictions: No   See Function Navigator for Current Functional Status.   Therapy/Group: Individual Therapy  Netta Corrigan, PT, DPT 06/06/2017, 7:52 AM

## 2017-06-06 NOTE — Progress Notes (Signed)
Subjective/Complaints: Aphasic, awake , limited Y/N response   ROS: ? Y/N accuracy   Objective: Vital Signs: Blood pressure (!) 147/86, pulse 88, temperature 98.7 F (37.1 C), temperature source Oral, resp. rate 18, height _0  (1.905 m), weight 90.2 kg (198 lb 13.7 oz), SpO2 97 %. No results found. Results for orders placed or performed during the hospital encounter of 05/20/17 (from the past 72 hour(s))  Glucose, capillary     Status: Abnormal   Collection Time: 06/03/17 11:59 AM  Result Value Ref Range   Glucose-Capillary 238 (H) 65 - 99 mg/dL  Glucose, capillary     Status: Abnormal   Collection Time: 06/03/17  4:39 PM  Result Value Ref Range   Glucose-Capillary 167 (H) 65 - 99 mg/dL   Comment 1 Notify RN   Glucose, capillary     Status: Abnormal   Collection Time: 06/03/17  9:06 PM  Result Value Ref Range   Glucose-Capillary 211 (H) 65 - 99 mg/dL   Comment 1 Notify RN   Glucose, capillary     Status: Abnormal   Collection Time: 06/04/17  7:00 AM  Result Value Ref Range   Glucose-Capillary 164 (H) 65 - 99 mg/dL   Comment 1 Notify RN   Basic metabolic panel     Status: Abnormal   Collection Time: 06/04/17  8:14 AM  Result Value Ref Range   Sodium 132 (L) 135 - 145 mmol/L   Potassium 5.0 3.5 - 5.1 mmol/L   Chloride 100 (L) 101 - 111 mmol/L   CO2 20 (L) 22 - 32 mmol/L   Glucose, Bld 188 (H) 65 - 99 mg/dL   BUN 39 (H) 6 - 20 mg/dL   Creatinine, Ser 1.94 (H) 0.61 - 1.24 mg/dL   Calcium 9.6 8.9 - 10.3 mg/dL   GFR calc non Af Amer 32 (L) >60 mL/min   GFR calc Af Amer 37 (L) >60 mL/min    Comment: (NOTE) The eGFR has been calculated using the CKD EPI equation. This calculation has not been validated in all clinical situations. eGFR's persistently <60 mL/min signify possible Chronic Kidney Disease.    Anion gap 12 5 - 15    Comment: Performed at [facility] [address] [place], [place] [address]  Glucose, capillary     Status: Abnormal   Collection Time:  06/04/17 12:06 PM  Result Value Ref Range   Glucose-Capillary 159 (H) 65 - 99 mg/dL   Comment 1 Notify RN   Glucose, capillary     Status: Abnormal   Collection Time: 06/04/17  4:16 PM  Result Value Ref Range   Glucose-Capillary 212 (H) 65 - 99 mg/dL  Glucose, capillary     Status: Abnormal   Collection Time: 06/04/17  9:47 PM  Result Value Ref Range   Glucose-Capillary 179 (H) 65 - 99 mg/dL  Glucose, capillary     Status: Abnormal   Collection Time: 06/05/17  6:51 AM  Result Value Ref Range   Glucose-Capillary 189 (H) 65 - 99 mg/dL  Glucose, capillary     Status: Abnormal   Collection Time: 06/05/17 11:43 AM  Result Value Ref Range   Glucose-Capillary 184 (H) 65 - 99 mg/dL  Glucose, capillary     Status: Abnormal   Collection Time: 06/05/17  4:35 PM  Result Value Ref Range   Glucose-Capillary 189 (H) 65 - 99 mg/dL  Glucose, capillary     Status: Abnormal   Collection Time: 06/05/17  9:33 PM  Result Value Ref Range   Glucose-Capillary 197 (H) 65 - 99 mg/dL  Glucose, capillary     Status: Abnormal   Collection Time: 06/06/17  6:35 AM  Result Value Ref Range   Glucose-Capillary 153 (H) 65 - 99 mg/dL     HEENT: poor dentition Cardio:RRR without murmur. No JVD  Resp: CTA Bilaterally without wheezes or rales. Normal effort  GI: BS positive and non tender Extremity:  No Edema Skin:   Intact and Other portacath site intact Neuro: lethargic and aphasic.  Abnormal Motor 3/5 RUE, 4/5 RLE , 5/5 on Left side,  Right inattention.  Musc/Skel:  Other no pain with UE or LE ROM Gen NAD   Assessment/Plan: 1. 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 Physiatrist is providing close team supervision and 24 hour management of active medical problems listed below. Physiatrist and rehab team continue to assess barriers to discharge/monitor patient progress  toward functional and medical goals. FIM: Function - Bathing Position: Sitting EOB Body parts bathed by patient: Right arm, Left arm, Chest, Abdomen, Front perineal area, Left lower leg, Right lower leg, Left upper leg, Right upper leg Body parts bathed by helper: Back, Buttocks Assist Level: Touching or steadying assistance(Pt > 75%)  Function- Upper Body Dressing/Undressing What is the patient wearing?: Pull over shirt/dress Pull over shirt/dress - Perfomed by patient: Put head through opening, Thread/unthread left sleeve, Thread/unthread right sleeve Pull over shirt/dress - Perfomed by helper: Pull shirt over trunk Assist Level: Supervision or verbal cues Function - Lower Body Dressing/Undressing What is the patient wearing?: Underwear, Pants, Shoes Position: Other (comment)(on toilet) Underwear - Performed by patient: Pull underwear up/down Underwear - Performed by helper: Thread/unthread right underwear leg, Thread/unthread left underwear leg Pants- Performed by patient: Pull pants up/down Pants- Performed by helper: Thread/unthread right pants leg, Thread/unthread left pants leg Non-skid slipper socks- Performed by helper: Don/doff right sock, Don/doff left sock Socks - Performed by helper: Don/doff right sock, Don/doff left sock Shoes - Performed by patient: Don/doff left shoe Shoes - Performed by helper: Don/doff right shoe, Fasten right, Fasten left, Don/doff left shoe Assist for footwear: Partial/moderate assist Assist for lower body dressing: Touching or steadying assistance (Pt > 75%)  Function - Toileting Toileting activity did not occur: No continent bowel/bladder event Toileting steps completed by helper: Adjust clothing prior to toileting, Performs perineal hygiene, Adjust clothing after toileting Toileting Assistive Devices: Other (comment)(stand up from wheelchair with aid of walker) Assist level: Touching or steadying assistance (Pt.75%)  Function - Toilet  Transfers Toilet transfer activity did not occur: N/A Toilet transfer assistive device: Environmental manager lift: Stedy Assist level to toilet: Moderate assist (Pt 50 - 74%/lift or lower) Assist level from toilet: Moderate assist (Pt 50 - 74%/lift or lower)  Function - Chair/bed transfer Chair/bed transfer method: Stand pivot Chair/bed transfer assist level: Moderate assist (Pt 50 - 74%/lift or lower) Chair/bed transfer assistive device: Armrests Mechanical lift: Stedy Chair/bed transfer details: Manual facilitation for weight shifting, Manual facilitation for weight bearing, Verbal cues for technique  Function - Locomotion: Wheelchair Will patient use wheelchair at discharge?: Yes Type: Manual Max wheelchair distance: 150' Assist Level: Supervision or verbal cues Assist Level: Supervision or verbal cues Assist Level: Supervision or verbal cues Turns around,maneuvers to table,bed, and toilet,negotiates 3% grade,maneuvers on rugs and over doorsills: No Function - Locomotion: Ambulation Assistive device: Walker-rolling, Orthosis Max distance: 10' Assist level: Maximal  assist (Pt 25 - 49%) Assist level: Maximal assist (Pt 25 - 49%) Walk 50 feet with 2 turns activity did not occur: Safety/medical concerns Walk 150 feet activity did not occur: Safety/medical concerns Walk 10 feet on uneven surfaces activity did not occur: Safety/medical concerns  Function - Comprehension Comprehension: Auditory Comprehension assist level: Understands basic 50 - 74% of the time/ requires cueing 25 - 49% of the time  Function - Expression Expression: Verbal Expression assist level: Expresses basis less than 25% of the time/requires cueing >75% of the time.  Function - Social Interaction Social Interaction assist level: Interacts appropriately 50 - 74% of the time - May be physically or verbally inappropriate.  Function - Problem Solving Problem solving assist level: Solves basic 25 - 49% of the time  - needs direction more than half the time to initiate, plan or complete simple activities  Function - Memory Memory assist level: Recognizes or recalls less than 25% of the time/requires cueing greater than 75% of the time Patient normally able to recall (first 3 days only): That he or she is in a hospital, Staff names and faces  Medical Problem List and Plan: 1.   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  2. DVT Prophylaxis/Anticoagulation: Pharmaceutical:Heparin--continue 3. Pain Management:tylenol prn 4. Mood:LCSW to follow for evaluation when appropriate. 5. Neuropsych: This patientis notcapable of making decisions on hisown behalf. 6. Skin/Wound Care:routine pressure relief measures. 7. Fluids/Electrolytes/Nutrition:Monitor I/O. Continue to offer supplements between meals. 8.Hypertension:Hypotensionhas resolved and patient offMidodrine and Florinef Vitals:   06/05/17 1300 06/06/17 0359  BP:  (!) 147/86  Pulse:  88  Resp:  18  Temp:  98.7 F (37.1 C)  SpO2: 99% 97%  Fair Control 3/31 9. Acute on chronic XBM:WUXLKGMW SCr- 1.8-1.9. at baseline BMP Latest Ref Rng & Units 06/04/2017 05/28/2017 05/21/2017  Glucose 65 - 99 mg/dL 188(H) 205(H) 198(H)  BUN 6 - 20 mg/dL 39(H) 38(H) 20  Creatinine 0.61 - 1.24 mg/dL 1.94(H) 1.90(H) 1.75(H)  Sodium 135 - 145 mmol/L 132(L) 132(L) 134(L)  Potassium 3.5 - 5.1 mmol/L 5.0 4.8 4.5  Chloride 101 - 111 mmol/L 100(L) 98(L) 102  CO2 22 - 32 mmol/L 20(L) 21(L) 21(L)  Calcium 8.9 - 10.3 mg/dL 9.6 9.2 8.9   10. ABLA: Monitor H/H for signs of bleedinghas been in 10- 11 range. 11 on 3/15  12. Lethargy: CVA related-improving    Trial Ritalin with some improvement in arousal noted, no tachycard will titrate up, discuss with team  13. Oral thrush:  nystatin mouthwash as well as oral care every 4 hours while awake. 14. COPD/Left lung cancer: On Breo with albuterol prn.  15.DM:  poorly controlled     -SSI   -added low dose amaryl on 3/16, increase to 97m on 3/22-monitor, not a good candidate for Metformin due to CKD CBG (last 3)  Recent Labs    06/05/17 1635 06/05/17 2133 06/06/17 0635  GLUCAP 189* 197* 153*   Increase lantus,25U   3/31    LOS (Days) 17 A FACE TO FACE EVALUATION WAS PERFORMED  ALuanna SalkKirsteins 06/06/2017, 8:36 AM

## 2017-06-07 ENCOUNTER — Inpatient Hospital Stay (HOSPITAL_COMMUNITY): Payer: Medicare Other | Admitting: Speech Pathology

## 2017-06-07 ENCOUNTER — Inpatient Hospital Stay (HOSPITAL_COMMUNITY): Payer: Medicare Other

## 2017-06-07 ENCOUNTER — Inpatient Hospital Stay (HOSPITAL_COMMUNITY): Payer: Medicare Other | Admitting: Occupational Therapy

## 2017-06-07 LAB — GLUCOSE, CAPILLARY
GLUCOSE-CAPILLARY: 192 mg/dL — AB (ref 65–99)
GLUCOSE-CAPILLARY: 179 mg/dL — AB (ref 65–99)
GLUCOSE-CAPILLARY: 285 mg/dL — AB (ref 65–99)
Glucose-Capillary: 267 mg/dL — ABNORMAL HIGH (ref 65–99)

## 2017-06-07 MED ORDER — INSULIN GLARGINE 100 UNIT/ML ~~LOC~~ SOLN
25.0000 [IU] | Freq: Every day | SUBCUTANEOUS | Status: DC
  Administered 2017-06-07: 25 [IU] via SUBCUTANEOUS
  Filled 2017-06-07 (×2): qty 0.25

## 2017-06-07 NOTE — Progress Notes (Signed)
Subjective/Complaints: Awakens to voice, remains aphasic   ROS: ? Y/N accuracy   Objective: Vital Signs: Blood pressure (!) 169/93, pulse 96, temperature 98.1 F (36.7 C), temperature source Oral, resp. rate 18, height '6\' 3"'$  (1.905 m), weight 90.2 kg (198 lb 13.7 oz), SpO2 97 %. No results found. Results for orders placed or performed during the hospital encounter of 05/20/17 (from the past 72 hour(s))  Basic metabolic panel     Status: Abnormal   Collection Time: 06/04/17  8:14 AM  Result Value Ref Range   Sodium 132 (L) 135 - 145 mmol/L   Potassium 5.0 3.5 - 5.1 mmol/L   Chloride 100 (L) 101 - 111 mmol/L   CO2 20 (L) 22 - 32 mmol/L   Glucose, Bld 188 (H) 65 - 99 mg/dL   BUN 39 (H) 6 - 20 mg/dL   Creatinine, Ser 1.94 (H) 0.61 - 1.24 mg/dL   Calcium 9.6 8.9 - 10.3 mg/dL   GFR calc non Af Amer 32 (L) >60 mL/min   GFR calc Af Amer 37 (L) >60 mL/min    Comment: (NOTE) The eGFR has been calculated using the CKD EPI equation. This calculation has not been validated in all clinical situations. eGFR's persistently <60 mL/min signify possible Chronic Kidney Disease.    Anion gap 12 5 - 15    Comment: Performed at [facility] [address] [place], [place] [address]  Glucose, capillary     Status: Abnormal   Collection Time: 06/04/17 12:06 PM  Result Value Ref Range   Glucose-Capillary 159 (H) 65 - 99 mg/dL   Comment 1 Notify RN   Glucose, capillary     Status: Abnormal   Collection Time: 06/04/17  4:16 PM  Result Value Ref Range   Glucose-Capillary 212 (H) 65 - 99 mg/dL  Glucose, capillary     Status: Abnormal   Collection Time: 06/04/17  9:47 PM  Result Value Ref Range   Glucose-Capillary 179 (H) 65 - 99 mg/dL  Glucose, capillary     Status: Abnormal   Collection Time: 06/05/17  6:51 AM  Result Value Ref Range   Glucose-Capillary 189 (H) 65 - 99 mg/dL  Glucose, capillary     Status: Abnormal   Collection Time: 06/05/17 11:43 AM  Result Value Ref Range   Glucose-Capillary 184 (H) 65 - 99 mg/dL  Glucose, capillary     Status: Abnormal   Collection Time: 06/05/17  4:35 PM  Result Value Ref Range   Glucose-Capillary 189 (H) 65 - 99 mg/dL  Glucose, capillary     Status: Abnormal   Collection Time: 06/05/17  9:33 PM  Result Value Ref Range   Glucose-Capillary 197 (H) 65 - 99 mg/dL  Glucose, capillary     Status: Abnormal   Collection Time: 06/06/17  6:35 AM  Result Value Ref Range   Glucose-Capillary 153 (H) 65 - 99 mg/dL  Glucose, capillary     Status: Abnormal   Collection Time: 06/06/17 11:31 AM  Result Value Ref Range   Glucose-Capillary 119 (H) 65 - 99 mg/dL  Glucose, capillary     Status: Abnormal   Collection Time: 06/06/17  4:41 PM  Result Value Ref Range   Glucose-Capillary 227 (H) 65 - 99 mg/dL  Glucose, capillary     Status: Abnormal   Collection Time: 06/06/17  9:05 PM  Result Value Ref Range   Glucose-Capillary 248 (H) 65 - 99 mg/dL  Glucose, capillary     Status: Abnormal  Collection Time: 06/07/17  6:37 AM  Result Value Ref Range   Glucose-Capillary 192 (H) 65 - 99 mg/dL     HEENT: poor dentition Cardio:RRR without murmur. No JVD  Resp: CTA Bilaterally without wheezes or rales. Normal effort  GI: BS positive and non tender Extremity:  No Edema Skin:   Intact and Other portacath site intact Neuro: lethargic and aphasic.  Abnormal Motor 3/5 RUE, 4/5 RLE , 5/5 on Left side,  Right inattention.  Musc/Skel:  Other no pain with UE or LE ROM Gen NAD   Assessment/Plan: 1. 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 Physiatrist is providing close team supervision and 24 hour management of active medical problems listed below. Physiatrist and rehab team continue to assess barriers to discharge/monitor patient progress toward functional and medical goals. FIM: Function - Bathing Position:  Sitting EOB Body parts bathed by patient: Right arm, Left arm, Chest, Abdomen, Front perineal area, Left lower leg, Right lower leg, Left upper leg, Right upper leg Body parts bathed by helper: Back, Buttocks Assist Level: Touching or steadying assistance(Pt > 75%)  Function- Upper Body Dressing/Undressing What is the patient wearing?: Pull over shirt/dress Pull over shirt/dress - Perfomed by patient: Put head through opening, Thread/unthread left sleeve, Thread/unthread right sleeve Pull over shirt/dress - Perfomed by helper: Pull shirt over trunk Assist Level: Supervision or verbal cues Function - Lower Body Dressing/Undressing What is the patient wearing?: Underwear, Pants, Shoes Position: Other (comment)(on toilet) Underwear - Performed by patient: Pull underwear up/down Underwear - Performed by helper: Thread/unthread right underwear leg, Thread/unthread left underwear leg Pants- Performed by patient: Pull pants up/down Pants- Performed by helper: Thread/unthread right pants leg, Thread/unthread left pants leg Non-skid slipper socks- Performed by helper: Don/doff right sock, Don/doff left sock Socks - Performed by helper: Don/doff right sock, Don/doff left sock Shoes - Performed by patient: Don/doff left shoe Shoes - Performed by helper: Don/doff right shoe, Fasten right, Fasten left, Don/doff left shoe Assist for footwear: Partial/moderate assist Assist for lower body dressing: Touching or steadying assistance (Pt > 75%)  Function - Toileting Toileting activity did not occur: No continent bowel/bladder event Toileting steps completed by helper: Adjust clothing prior to toileting, Performs perineal hygiene, Adjust clothing after toileting Toileting Assistive Devices: Other (comment)(stand up from wheelchair with aid of walker) Assist level: Touching or steadying assistance (Pt.75%)  Function - Toilet Transfers Toilet transfer activity did not occur: N/A Toilet transfer assistive  device: Environmental manager lift: Stedy Assist level to toilet: Moderate assist (Pt 50 - 74%/lift or lower) Assist level from toilet: Moderate assist (Pt 50 - 74%/lift or lower)  Function - Chair/bed transfer Chair/bed transfer method: Stand pivot Chair/bed transfer assist level: Moderate assist (Pt 50 - 74%/lift or lower) Chair/bed transfer assistive device: Armrests Mechanical lift: Stedy Chair/bed transfer details: Manual facilitation for weight shifting, Manual facilitation for weight bearing, Verbal cues for technique  Function - Locomotion: Wheelchair Will patient use wheelchair at discharge?: Yes Type: Manual Max wheelchair distance: 150' Assist Level: Supervision or verbal cues Assist Level: Supervision or verbal cues Assist Level: Supervision or verbal cues Turns around,maneuvers to table,bed, and toilet,negotiates 3% grade,maneuvers on rugs and over doorsills: No Function - Locomotion: Ambulation Assistive device: Walker-rolling, Orthosis Max distance: 33 ft Assist level: Moderate assist (Pt 50 - 74%) Assist level: Moderate assist (Pt 50 - 74%) Walk 50 feet with 2 turns activity did not occur:  Safety/medical concerns Walk 150 feet activity did not occur: Safety/medical concerns Walk 10 feet on uneven surfaces activity did not occur: Safety/medical concerns  Function - Comprehension Comprehension: Auditory Comprehension assist level: Understands basic 50 - 74% of the time/ requires cueing 25 - 49% of the time  Function - Expression Expression: Verbal Expression assist level: Expresses basis less than 25% of the time/requires cueing >75% of the time.  Function - Social Interaction Social Interaction assist level: Interacts appropriately 50 - 74% of the time - May be physically or verbally inappropriate.  Function - Problem Solving Problem solving assist level: Solves basic 25 - 49% of the time - needs direction more than half the time to initiate, plan or complete  simple activities  Function - Memory Memory assist level: Recognizes or recalls less than 25% of the time/requires cueing greater than 75% of the time Patient normally able to recall (first 3 days only): That he or she is in a hospital, Staff names and faces  Medical Problem List and Plan: 1.   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  2. DVT Prophylaxis/Anticoagulation: Pharmaceutical:Heparin--continue 3. Pain Management:tylenol prn 4. Mood:LCSW to follow for evaluation when appropriate. 5. Neuropsych: This patientis notcapable of making decisions on hisown behalf. 6. Skin/Wound Care:routine pressure relief measures. 7. Fluids/Electrolytes/Nutrition:Monitor I/O. Continue to offer supplements between meals. 8.Hypertension:Hypotensionhas resolved and patient offMidodrine and Florinef Vitals:   06/06/17 1435 06/07/17 0159  BP: 136/88 (!) 169/93  Pulse: 88 96  Resp: 17 18  Temp: 98.2 F (36.8 C) 98.1 F (36.7 C)  SpO2: 97% 97%  elevated 4/1- monitor- check orthostatic vitals 9. Acute on chronic TKW:IOXBDZHG SCr- 1.8-1.9. at baseline BMP Latest Ref Rng & Units 06/04/2017 05/28/2017 05/21/2017  Glucose 65 - 99 mg/dL 188(H) 205(H) 198(H)  BUN 6 - 20 mg/dL 39(H) 38(H) 20  Creatinine 0.61 - 1.24 mg/dL 1.94(H) 1.90(H) 1.75(H)  Sodium 135 - 145 mmol/L 132(L) 132(L) 134(L)  Potassium 3.5 - 5.1 mmol/L 5.0 4.8 4.5  Chloride 101 - 111 mmol/L 100(L) 98(L) 102  CO2 22 - 32 mmol/L 20(L) 21(L) 21(L)  Calcium 8.9 - 10.3 mg/dL 9.6 9.2 8.9   10. ABLA: Monitor H/H for signs of bleedinghas been in 10- 11 range. 11 on 3/15  12. Lethargy: CVA related-improving    Trial Ritalin with some improvement in arousal noted, no tachycard will titrate up, discuss with team  13. Oral thrush:  nystatin mouthwash as well as oral care every 4 hours while awake. 14. COPD/Left lung cancer: On Breo with albuterol prn.  15.DM: poorly controlled      -SSI   -added low dose amaryl on 3/16, increase to '4mg'$  on 3/22-monitor, not a good candidate for Metformin due to CKD CBG (last 3)  Recent Labs    06/06/17 1641 06/06/17 2105 06/07/17 0637  GLUCAP 227* 248* 192*   Increase lantus,25U 4/1    LOS (Days) 18 A FACE TO FACE EVALUATION WAS PERFORMED  Luanna Salk Zaydyn Havey 06/07/2017, 7:25 AM

## 2017-06-07 NOTE — Progress Notes (Signed)
Physical Therapy Session Note  Patient Details  Name: Edwin Jones MRN: [id number] Date of Birth: February 02, 1941  Today's Date: 06/07/2017 PT Individual Time: 0900-0958 PT Individual Time Calculation (min): 58 min   Short Term Goals: Week 3:  PT Short Term Goal 1 (Week 3): Pt will negotiate 1 curb w/ RW w/ mod assist  PT Short Term Goal 2 (Week 3): Pt will perform bed mobility w/o use of hospital bed functions w/ min assist PT Short Term Goal 3 (Week 3): Pt will ambulate 25' w/ LRAD w/ min assist  Skilled Therapeutic Interventions/Progress Updates:    Pt supine in bed upon PT arrival, agreeable to therapy tx and denies pain. Pt transferred from supine>sitting EOB with min assist, verbal cues for techniques. In sitting, donned shoes and pants, pt performed sit<>stand with mod assist, worked on standing balance while pulling pants over hips. Pt performed stand pivot transfer from bed>w/c with mod assist, verbal cues for techniques. Pt propelled w/c using L hemi techniques from room>gym with supervision, verbal cues for obstacle avoidance and encouragement. Pt ambulated x 45 ft and x 38 ft with RW, R hand orthosis and R AFO mod assist, pt requiring manual facilitation for L lateral weightshift to allow for R LE swing through, pt able to advance R LE past left foot initially, decreased step length with fatigue requiring increased facilitation for step. Pt performed sit<>stands x 5 from the mat with min assist, verbal/tactile cues for anterior trunk lean, verbal cues and visual feedback using mirror for upright posture upon standing. Pt worked on standing balance reaching overhead/outside BOS for horse shoes, min assist for balance, verbal cues for upright posture and midline. Pt transported back to room and left seated in w/c with needs in reach.    Therapy Documentation Precautions:  Precautions Precautions: Fall Precaution Comments: apraxic, R hemi, R inattention Restrictions Weight Bearing  Restrictions: No   See Function Navigator for Current Functional Status.   Therapy/Group: Individual Therapy  Netta Corrigan, PT, DPT 06/07/2017, 7:45 AM

## 2017-06-07 NOTE — Progress Notes (Signed)
Social Work Patient ID: Edwin Jones, male   DOB: 04-Dec-1940, 77 y.o.   MRN: [id number]  [place] with wife via telephone to discuss discharge plan for husband and if she is able to provide the care he requires. She wants to try but would like him to stay her longer and has many medical questions. Will ask in team conference regarding lengthening stay here and have Pam-PA call wife to answer her medical questions. She has been here and attended therapies with pt and can see his progress.

## 2017-06-07 NOTE — Progress Notes (Signed)
Occupational Therapy Session Note  Patient Details  Name: Edwin Jones MRN: [id number] Date of Birth: [birth year]  Today's Date: 06/07/2017 OT Individual Time: 2800-3491 OT Individual Time Calculation (min): 72 min    Short Term Goals: Week 3:  OT Short Term Goal 1 (Week 3): STG=LTG 2/2 ELOS  Skilled Therapeutic Interventions/Progress Updates:    Pt greeted sitting in wc and agreeable to OT. OT asked pt if he needed to use the bathroom and he shook his head yes. Stand-pivot wc>raised toilet with min A. Max A for clothing management and noted to already be incontinent of bowel and bladder.  Pt with more BM into toilet. Worked on hip hike strategy for self-toileting, but pt having difficulty motor planning and achieving trunk rotation to reach behind. Total A for peri-care for thoroughness. Stand-pivot back to wc on R side with mod A overall and facilitation at trunk for weight shifting and pivot. Worked on sit<>stand and standing balance while washing buttocks at the sink. Pt with difficulty fully extending R knee, hip, and trunk, requiring facilitation for full upright posture. Sit<>stand 5x throughout bathing/dressing at the sink. Worked on functional use of R UE by incorporating modified constraint induced movement therapy while washing body. Max multimodal cues throughout session to use R UE. Mod cues to recall hemi dressing techniques, but pt able to thread B LEs into pants today. Pt left seated in wc at end of session with safety belt on and needs met.   Therapy Documentation Precautions:  Precautions Precautions: Fall Precaution Comments: apraxic, R hemi, R inattention Restrictions Weight Bearing Restrictions: No Pain: Pain Assessment Pain Scale: 0-10 Pain Score: 0-No pain ADL: ADL ADL Comments: Please see functional navigator  See Function Navigator for Current Functional Status.   Therapy/Group: Individual Therapy  Valma Cava 06/07/2017, 11:35 AM

## 2017-06-07 NOTE — Plan of Care (Signed)
  Problem: RH BOWEL ELIMINATION Goal: RH STG MANAGE BOWEL WITH ASSISTANCE Description STG Manage Bowel with mod  Assistance.  Outcome: Progressing Flowsheets (Taken 06/07/2017 1549) STG: Pt will manage bowels with assistance: 1-Total assistance Goal: RH STG MANAGE BOWEL W/MEDICATION W/ASSISTANCE Description STG Manage Bowel with Medication with Assistance. Outcome: Progressing Flowsheets (Taken 06/07/2017 1549) STG: Pt will manage bowels with medication with assistance: 1-Total assistance   Problem: RH BLADDER ELIMINATION Goal: RH STG MANAGE BLADDER WITH ASSISTANCE Description STG Manage Bladder With mod Assistance  Outcome: Progressing Flowsheets (Taken 06/07/2017 1549) STG: Pt will manage bladder with assistance: 1-Total assistance   Problem: RH SKIN INTEGRITY Goal: RH STG SKIN FREE OF INFECTION/BREAKDOWN Description Skin free of infection/breakdown with min assistance  Outcome: Progressing Goal: RH STG MAINTAIN SKIN INTEGRITY WITH ASSISTANCE Description STG Maintain Skin Integrity With min Assistance.  Outcome: Progressing Flowsheets (Taken 06/07/2017 1549) STG: Maintain skin integrity with assistance: 4-Minimal assistance   Problem: RH SAFETY Goal: RH STG ADHERE TO SAFETY PRECAUTIONS W/ASSISTANCE/DEVICE Description STG Adhere to Safety Precautions With min  Assistance/Device.  Outcome: Progressing Flowsheets (Taken 06/07/2017 1549) STG:Pt will adhere to safety precautions with assistance/device: 3-Moderate assistance Goal: RH STG DECREASED RISK OF FALL WITH ASSISTANCE Description STG Decreased Risk of Fall With min Assistance.  Outcome: Progressing

## 2017-06-07 NOTE — Progress Notes (Signed)
Speech Language Pathology Daily Session Note  Patient Details  Name: Nitin Mckowen MRN: [id number] Date of Birth: 1941-01-29  Today's Date: 06/07/2017 SLP Individual Time: 0730-0828 SLP Individual Time Calculation (min): 58 min  Short Term Goals: Week 3: SLP Short Term Goal 1 (Week 3): Pt will sustain attention to task for ~ 30 minutes with Min A verbal cues.  SLP Short Term Goal 2 (Week 3): Pt will decode at the sentence level with 80% accuracy with Mod A verbal and visual cues.  SLP Short Term Goal 3 (Week 3): Pt will demonstrate word-finding in 7 out of 10 opportunities with Max A verbal cues.  SLP Short Term Goal 4 (Week 3): Pt will follow 1-step simple directions in 8 out of 10 opportunities with Supervision A verbal cues.  SLP Short Term Goal 5 (Week 3): Pt will answer basic yes/no question in 8 out of 10 opportunities with Min A cues.  SLP Short Term Goal 6 (Week 3): Pt will consume trials of dysphagia 3 textures with efficient mastication and minimal oral residue with Min A verbal cues for use of compensatory swallowing strategies over 2 sessions prior to upgrade.   Skilled Therapeutic Interventions: Skilled treatment session focused on dysphagia and communication goals. SLP facilitated session by providing skilled observation of pt consuming breakfast. Pt consumed dys. 2 textures without overt s/s of aspiration and demonstrated self-monitor for right anterior spillage and right pocketing with Mod A verbal cues. Pt also consumed thin liquids with overt immediate cough x1, suspect due to head position (hyperextension). Recommend continue full supervision for all meals. Pt answered basic yes/no questions regarding wants/needs with 100% accuracy with Min A verbal cues. Pt required Max A verbal cues to select functional objects in field of 2 and also hand over hand assistance during tray set-up. Pt left upright in bed with alarm on and all needs within reach. Continue current plan of care.       Function:  Eating Eating   Modified Consistency Diet: Yes Eating Assist Level: Set up assist for;Supervision or verbal cues;More than reasonable amount of time;Helper checks for pocketed food;Help with picking up utensils;Help managing cup/glass;Helper scoops food on utensil   Eating Set Up Assist For: Opening containers Helper [place] on Utensil: Occasionally     Cognition Comprehension Comprehension assist level: Understands basic 50 - 74% of the time/ requires cueing 25 - 49% of the time  Expression   Expression assist level: Expresses basis less than 25% of the time/requires cueing >75% of the time.  Social Interaction Social Interaction assist level: Interacts appropriately 50 - 74% of the time - May be physically or verbally inappropriate.  Problem Solving Problem solving assist level: Solves basic 25 - 49% of the time - needs direction more than half the time to initiate, plan or complete simple activities  Memory Memory assist level: Recognizes or recalls less than 25% of the time/requires cueing greater than 75% of the time    Pain Pain Assessment Pain Score: 0-No pain  Therapy/Group: Individual Therapy  Meredeth Ide  SLP - Student 06/07/2017, 8:53 AM

## 2017-06-08 ENCOUNTER — Inpatient Hospital Stay (HOSPITAL_COMMUNITY): Payer: Medicare Other

## 2017-06-08 ENCOUNTER — Inpatient Hospital Stay (HOSPITAL_COMMUNITY): Payer: Medicare Other | Admitting: Speech Pathology

## 2017-06-08 ENCOUNTER — Inpatient Hospital Stay (HOSPITAL_COMMUNITY): Payer: Medicare Other | Admitting: Physical Therapy

## 2017-06-08 ENCOUNTER — Inpatient Hospital Stay (HOSPITAL_COMMUNITY): Payer: Medicare Other | Admitting: Occupational Therapy

## 2017-06-08 LAB — GLUCOSE, CAPILLARY
Glucose-Capillary: 225 mg/dL — ABNORMAL HIGH (ref 65–99)
Glucose-Capillary: 225 mg/dL — ABNORMAL HIGH (ref 65–99)
Glucose-Capillary: 178 mg/dL — ABNORMAL HIGH (ref 65–99)
Glucose-Capillary: 257 mg/dL — ABNORMAL HIGH (ref 65–99)

## 2017-06-08 MED ORDER — INSULIN GLARGINE 100 UNIT/ML ~~LOC~~ SOLN
30.0000 [IU] | Freq: Every day | SUBCUTANEOUS | Status: DC
  Administered 2017-06-08 – 2017-06-10 (×3): 30 [IU] via SUBCUTANEOUS
  Filled 2017-06-08 (×3): qty 0.3

## 2017-06-08 NOTE — Progress Notes (Signed)
Speech Language Pathology Daily Session Note  Patient Details  Name: Edwin Jones MRN: [id number] Date of Birth: 1940/11/24  Today's Date: 06/08/2017 SLP Individual Time: 1115-1200 SLP Individual Time Calculation (min): 45 min  Short Term Goals: Week 3: SLP Short Term Goal 1 (Week 3): Pt will sustain attention to task for ~ 30 minutes with Min A verbal cues.  SLP Short Term Goal 2 (Week 3): Pt will decode at the sentence level with 80% accuracy with Mod A verbal and visual cues.  SLP Short Term Goal 3 (Week 3): Pt will demonstrate word-finding in 7 out of 10 opportunities with Max A verbal cues.  SLP Short Term Goal 4 (Week 3): Pt will follow 1-step simple directions in 8 out of 10 opportunities with Supervision A verbal cues.  SLP Short Term Goal 5 (Week 3): Pt will answer basic yes/no question in 8 out of 10 opportunities with Min A cues.  SLP Short Term Goal 6 (Week 3): Pt will consume trials of dysphagia 3 textures with efficient mastication and minimal oral residue with Min A verbal cues for use of compensatory swallowing strategies over 2 sessions prior to upgrade.   Skilled Therapeutic Interventions: Skilled treatment session focused on cognitive and communication goals. Upon arrival, pt was asleep in bed but easily awakened. Per wife's request, SLP provided assistance for pt to get dressed and transferred pt from bed to wheelchair. Pt required Min A verbal cues to follow 1-step directions throughout task. SLP also facilitated session by providing Mod A verbal and visual cues for pt to decode at the sentence level with 95% accuracy. Given wh-questions, pt demonstrated word-finding at the word level in 75% opportunities with Max A verbal cues. Pt also demonstrated increased spontaneous verbalization at the phrase level this session and required Supervision verbal cues for vocal intensity to maximize his effective communication throughout session. Pt able to sustain attention to tasks for ~30  minutes with Min A verbal cues for redirection. SLP educated wife on pt's current deficits and progress as well as communication strategies. Pt's wife verbalized understanding and agreement. Pt left upright in wheelchair with quick release belt on and wife present. Continue with current plan of care.      Function:  Cognition Comprehension Comprehension assist level: Understands basic 50 - 74% of the time/ requires cueing 25 - 49% of the time  Expression   Expression assist level: Expresses basis less than 25% of the time/requires cueing >75% of the time.  Social Interaction Social Interaction assist level: Interacts appropriately 75 - 89% of the time - Needs redirection for appropriate language or to initiate interaction.  Problem Solving Problem solving assist level: Solves basic 50 - 74% of the time/requires cueing 25 - 49% of the time  Memory Memory assist level: Recognizes or recalls less than 25% of the time/requires cueing greater than 75% of the time    Pain Pain Assessment Pain Score: 0-No pain   Therapy/Group: Individual Therapy  Meredeth Ide  SLP - Student 06/08/2017, 12:11 PM

## 2017-06-08 NOTE — Progress Notes (Signed)
Subjective/Complaints: No issues overnite, eating breakfast , smiling   ROS: ? Y/N accuracy   Objective: Vital Signs: Blood pressure 128/82, pulse 76, temperature 98 F (36.7 C), temperature source Oral, resp. rate 18, height 6\' 3"  (1.905 m), weight 90.2 kg (198 lb 13.7 oz), SpO2 98 %. No results found. Results for orders placed or performed during the hospital encounter of 05/20/17 (from the past 72 hour(s))  Glucose, capillary     Status: Abnormal   Collection Time: 06/05/17 11:43 AM  Result Value Ref Range   Glucose-Capillary 184 (H) 65 - 99 mg/dL  Glucose, capillary     Status: Abnormal   Collection Time: 06/05/17  4:35 PM  Result Value Ref Range   Glucose-Capillary 189 (H) 65 - 99 mg/dL  Glucose, capillary     Status: Abnormal   Collection Time: 06/05/17  9:33 PM  Result Value Ref Range   Glucose-Capillary 197 (H) 65 - 99 mg/dL  Glucose, capillary     Status: Abnormal   Collection Time: 06/06/17  6:35 AM  Result Value Ref Range   Glucose-Capillary 153 (H) 65 - 99 mg/dL  Glucose, capillary     Status: Abnormal   Collection Time: 06/06/17 11:31 AM  Result Value Ref Range   Glucose-Capillary 119 (H) 65 - 99 mg/dL  Glucose, capillary     Status: Abnormal   Collection Time: 06/06/17  4:41 PM  Result Value Ref Range   Glucose-Capillary 227 (H) 65 - 99 mg/dL  Glucose, capillary     Status: Abnormal   Collection Time: 06/06/17  9:05 PM  Result Value Ref Range   Glucose-Capillary 248 (H) 65 - 99 mg/dL  Glucose, capillary     Status: Abnormal   Collection Time: 06/07/17  6:37 AM  Result Value Ref Range   Glucose-Capillary 192 (H) 65 - 99 mg/dL  Glucose, capillary     Status: Abnormal   Collection Time: 06/07/17 12:02 PM  Result Value Ref Range   Glucose-Capillary 179 (H) 65 - 99 mg/dL  Glucose, capillary     Status: Abnormal   Collection Time: 06/07/17  4:44 PM  Result Value Ref Range   Glucose-Capillary 285 (H) 65 - 99 mg/dL  Glucose, capillary     Status: Abnormal    Collection Time: 06/07/17  8:44 PM  Result Value Ref Range   Glucose-Capillary 267 (H) 65 - 99 mg/dL  Glucose, capillary     Status: Abnormal   Collection Time: 06/08/17  6:29 AM  Result Value Ref Range   Glucose-Capillary 225 (H) 65 - 99 mg/dL     HEENT: poor dentition Cardio:RRR without murmur. No JVD  Resp: CTA Bilaterally without wheezes or rales. Normal effort  GI: BS positive and non tender Extremity:  No Edema Skin:   Intact and Other portacath site intact Neuro: lethargic and aphasic.  Abnormal Motor 3/5 RUE, 4/5 RLE , 5/5 on Left side,  Right inattention.  Musc/Skel:  Other no pain with UE or LE ROM Gen NAD   Assessment/Plan: 1. 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 Physiatrist is providing close team supervision and 24 hour management of active medical problems listed below. Physiatrist and rehab team continue to assess barriers to discharge/monitor patient progress toward functional and medical goals. FIM: Function - Bathing Position: Wheelchair/chair at sink Body parts bathed by patient: Right arm, Left arm, Chest, Abdomen, Front perineal area, Left lower leg,  Right lower leg, Left upper leg, Right upper leg Body parts bathed by helper: Buttocks Assist Level: Touching or steadying assistance(Pt > 75%)  Function- Upper Body Dressing/Undressing What is the patient wearing?: Pull over shirt/dress Pull over shirt/dress - Perfomed by patient: Put head through opening, Thread/unthread left sleeve Pull over shirt/dress - Perfomed by helper: Thread/unthread right sleeve, Pull shirt over trunk Assist Level: Touching or steadying assistance(Pt > 75%) Function - Lower Body Dressing/Undressing What is the patient wearing?: Pants, Socks, Shoes, AFO Position: Wheelchair/chair at sink Underwear - Performed by patient: Pull underwear  up/down Underwear - Performed by helper: Thread/unthread right underwear leg, Thread/unthread left underwear leg Pants- Performed by patient: Thread/unthread left pants leg, Thread/unthread right pants leg Pants- Performed by helper: Pull pants up/down Non-skid slipper socks- Performed by helper: Don/doff right sock, Don/doff left sock Socks - Performed by helper: Don/doff right sock, Don/doff left sock Shoes - Performed by patient: Don/doff left shoe Shoes - Performed by helper: Don/doff right shoe, Don/doff left shoe, Fasten left, Fasten right AFO - Performed by helper: Don/doff right AFO Assist for footwear: Partial/moderate assist Assist for lower body dressing: Touching or steadying assistance (Pt > 75%)  Function - Toileting Toileting activity did not occur: No continent bowel/bladder event Toileting steps completed by helper: Adjust clothing prior to toileting, Performs perineal hygiene, Adjust clothing after toileting Toileting Assistive Devices: Other (comment)(stand up from wheelchair with aid of walker) Assist level: Touching or steadying assistance (Pt.75%)  Function - Toilet Transfers Toilet transfer activity did not occur: N/A Toilet transfer assistive device: Grab bar, Elevated toilet seat/BSC over toilet Mechanical lift: Stedy Assist level to toilet: Moderate assist (Pt 50 - 74%/lift or lower) Assist level from toilet: Moderate assist (Pt 50 - 74%/lift or lower)  Function - Chair/bed transfer Chair/bed transfer method: Stand pivot Chair/bed transfer assist level: Moderate assist (Pt 50 - 74%/lift or lower) Chair/bed transfer assistive device: Armrests Mechanical lift: Stedy Chair/bed transfer details: Manual facilitation for weight shifting, Manual facilitation for weight bearing, Verbal cues for technique  Function - Locomotion: Wheelchair Will patient use wheelchair at discharge?: Yes Type: Manual Max wheelchair distance: 150' Assist Level: Supervision or verbal  cues Assist Level: Supervision or verbal cues Assist Level: Supervision or verbal cues Turns around,maneuvers to table,bed, and toilet,negotiates 3% grade,maneuvers on rugs and over doorsills: No Function - Locomotion: Ambulation Assistive device: Walker-rolling, Orthosis Max distance: 45 ft Assist level: Moderate assist (Pt 50 - 74%) Assist level: Moderate assist (Pt 50 - 74%) Walk 50 feet with 2 turns activity did not occur: Safety/medical concerns Walk 150 feet activity did not occur: Safety/medical concerns Walk 10 feet on uneven surfaces activity did not occur: Safety/medical concerns  Function - Comprehension Comprehension: Auditory Comprehension assist level: Understands basic 50 - 74% of the time/ requires cueing 25 - 49% of the time  Function - Expression Expression: Verbal Expression assist level: Expresses basis less than 25% of the time/requires cueing >75% of the time.  Function - Social Interaction Social Interaction assist level: Interacts appropriately 50 - 74% of the time - May be physically or verbally inappropriate.  Function - Problem Solving Problem solving assist level: Solves basic 25 - 49% of the time - needs direction more than half the time to initiate, plan or complete simple activities  Function - Memory Memory assist level: Recognizes or recalls less than 25% of the time/requires cueing greater than 75% of the time Patient normally able to recall (first 3 days only): That he or she is  in a hospital, Staff names and faces  Medical Problem List and Plan: 1.   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  2. DVT Prophylaxis/Anticoagulation: Pharmaceutical:Heparin--continue 3. Pain Management:tylenol prn 4. Mood:LCSW to follow for evaluation when appropriate. 5. Neuropsych: This patientis notcapable of making decisions on hisown behalf. 6. Skin/Wound Care:routine pressure relief measures. 7.  Fluids/Electrolytes/Nutrition:Monitor I/O. Continue to offer supplements between meals. 8.Hypertension:Hypotensionhas resolved and patient offMidodrine and Florinef Vitals:   06/07/17 1355 06/08/17 0554  BP: 140/87 128/82  Pulse: 92 76  Resp: 18 18  Temp: 98.2 F (36.8 C) 98 F (36.7 C)  SpO2: 100% 98%  Controlled 4/2- monitor-Await orthostatic vitals 9. 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 at baseline BMP Latest Ref Rng & Units 06/04/2017 05/28/2017 05/21/2017  Glucose 65 - 99 mg/dL 188(H) 205(H) 198(H)  BUN 6 - 20 mg/dL 39(H) 38(H) 20  Creatinine 0.61 - 1.24 mg/dL 1.94(H) 1.90(H) 1.75(H)  Sodium 135 - 145 mmol/L 132(L) 132(L) 134(L)  Potassium 3.5 - 5.1 mmol/L 5.0 4.8 4.5  Chloride 101 - 111 mmol/L 100(L) 98(L) 102  CO2 22 - 32 mmol/L 20(L) 21(L) 21(L)  Calcium 8.9 - 10.3 mg/dL 9.6 9.2 8.9   10. ABLA: Monitor H/H for signs of bleedinghas been in 10- 11 range. 11 on 3/15  12. Lethargy: CVA related-improving    Trial Ritalin with some improvement in arousal noted, no tachycard will titrate up, discuss with team  13. Oral thrush:  nystatin mouthwash as well as oral care every 4 hours while awake. 14. COPD/Left lung cancer: On Breo with albuterol prn.  15.DM: poorly controlled     -SSI   -added low dose amaryl on 3/16, increase to 4mg  on 3/22-monitor, not a good candidate for Metformin due to CKD CBG (last 3)  Recent Labs    06/07/17 1644 06/07/17 2044 06/08/17 0629  GLUCAP 285* 267* 225*   Increase lantus,25U 4/1 , increase to 30U 4/2    LOS (Days) 19 A FACE TO FACE EVALUATION WAS PERFORMED  Charlett Blake 06/08/2017, 7:46 AM

## 2017-06-08 NOTE — Progress Notes (Signed)
Occupational Therapy Session Note  Patient Details  Name: Edwin Jones MRN: [id number] Date of Birth: June 13, 1940  Today's Date: 06/08/2017 OT Individual Time: 1300-1400 OT Individual Time Calculation (min): 60 min    Short Term Goals: Week 3:  OT Short Term Goal 1 (Week 3): STG=LTG 2/2 ELOS  Skilled Therapeutic Interventions/Progress Updates:    Pt greeted sitting in wc and agreeable to OT. Pt declined showering today. Pt noted to be incontinent of bladder in brief and declined needing to go to the bathroom more. Addressed sit<>stand, standing endurance, and dynamic balance while standing for peri-care and brief change. Pt with improved standing balance with improved midline orientation while washing buttocks-overall min A for balance. Pt brought to therapy gym and completed stand-pivot to therapy mat on R side with mod A. Graded peg board activity for R UE neuro re-ed. Pinch, translation, and rotation of pegs with built up ends for grip. Incorporate trunk rotation and visual scanning to R to locate bin of pegs by buttocks on R side. Increased time and elbow support needed at times when R UE fatigues. Pt brought to therapy apartment and practiced tub bench transfer with mod A. Pt returned to room at end of session and left seated in wc with safety belt on and needs met.  Therapy Documentation Precautions:  Precautions Precautions: Fall Precaution Comments: apraxic, R hemi, R inattention Restrictions Weight Bearing Restrictions: No Pain: Pain Assessment Pain Score: 0-No pain ADL: ADL ADL Comments: Please see functional navigator or for Current Functional Status.   Therapy/Group: Individual Therapy  Valma Cava 06/08/2017, 1:30 PM

## 2017-06-08 NOTE — Progress Notes (Signed)
Physical Therapy Session Note  Patient Details  Name: Edwin Jones MRN: [id number] Date of Birth: [birth year]  Today's Date: 06/08/2017 PT Individual Time: 1400-1500 PT Individual Time Calculation (min): 60 min   Short Term Goals: Week 3:  PT Short Term Goal 1 (Week 3): Pt will negotiate 1 curb w/ RW w/ mod assist  PT Short Term Goal 2 (Week 3): Pt will perform bed mobility w/o use of hospital bed functions w/ min assist PT Short Term Goal 3 (Week 3): Pt will ambulate 25' w/ LRAD w/ min assist  Skilled Therapeutic Interventions/Progress Updates:    Pt seated in w/c upon PT arrival, agreeable to therapy tx and denies pain. Pt transported from room>gym in w/c. Pt ascended/descended x 6 steps (3 inch) using B handrails with min to mod assist, verbal cues for techniques and foot placement, manual facilitation for hip extension and upright posture. Pt ambulated 2 x 50 ft with RW and min-mod assist (increased assist needed during turns to sit in w/c), manual facilitation for L lateral weightshift and R hip flexion during swing, verbal cues for hip flexion. Pt ascended/descended 1 step (6 inch) x 4 with mod assist and verbal cues for techniques. Pt worked on R hip flexion for neuro re-ed to perform toe taps x 5. Pt transported back to room, left seated in w/c with needs in reach.   Therapy Documentation Precautions:  Precautions Precautions: Fall Precaution Comments: apraxic, R hemi, R inattention Restrictions Weight Bearing Restrictions: No   See Function Navigator for Current Functional Status.   Therapy/Group: Individual Therapy  Netta Corrigan, PT, DPT 06/08/2017, 2:28 PM

## 2017-06-08 NOTE — Progress Notes (Signed)
Physical Therapy Session Note  Patient Details  Name: Edwin Jones MRN: [id number] Date of Birth: 07/20/1940  Today's Date: 06/08/2017 PT Individual Time: 1625-1650 PT Individual Time Calculation (min): 25 min   Short Term Goals: Week 3:  PT Short Term Goal 1 (Week 3): Pt will negotiate 1 curb w/ RW w/ mod assist  PT Short Term Goal 2 (Week 3): Pt will perform bed mobility w/o use of hospital bed functions w/ min assist PT Short Term Goal 3 (Week 3): Pt will ambulate 25' w/ LRAD w/ min assist  Skilled Therapeutic Interventions/Progress Updates:   Pt in w/c and agreeable to therapy, no c/o pain. Worked on negotiating curb w/ RW in anticipation of d/c to home. Pt has 2 steps to get into house, one 7" step to a platform and another 2" step to a platform. Practiced using RW to negotiate 4" curb for demonstration of technique and for safety as pt had not practiced this technique before. Pt performed x2 reps w/ RW and min assist for lateral weight shifting while taking steps and for RW management. Educated pt on using RW for UE support when negotiating platform, pt initially hesitant but demonstrated no safety concerns when performing w/ therapist. Pt able to place walker correctly w/ min manual assist and verbal cues. Pt w/ increased fatigue this session, required brief rest breaks w/ upright activity both in sitting and standing 2/2 increased work of breathing w/ boosting on step and boosting into standing. Returned to room and ended session in w/c, call bell within reach and all needs met. Quick release belt donned.   Therapy Documentation Precautions:  Precautions Precautions: Fall Precaution Comments: apraxic, R hemi, R inattention Restrictions Weight Bearing Restrictions: No Vital Signs: Therapy Vitals Temp: 98 F (36.7 C) Temp Source: Oral Pulse Rate: 93 BP: 124/89 Patient Position (if appropriate): Sitting Oxygen Therapy SpO2: 100 % O2 Device: Room Air  See Function Navigator  for Current Functional Status.   Therapy/Group: Individual Therapy   K Arnette 06/08/2017, 4:56 PM  

## 2017-06-09 ENCOUNTER — Inpatient Hospital Stay (HOSPITAL_COMMUNITY): Payer: Medicare Other

## 2017-06-09 ENCOUNTER — Inpatient Hospital Stay (HOSPITAL_COMMUNITY): Payer: Medicare Other | Admitting: Occupational Therapy

## 2017-06-09 ENCOUNTER — Inpatient Hospital Stay (HOSPITAL_COMMUNITY): Payer: Medicare Other | Admitting: Speech Pathology

## 2017-06-09 ENCOUNTER — Ambulatory Visit: Payer: Medicare Other | Admitting: Radiation Oncology

## 2017-06-09 LAB — GLUCOSE, CAPILLARY
GLUCOSE-CAPILLARY: 199 mg/dL — AB (ref 65–99)
Glucose-Capillary: 152 mg/dL — ABNORMAL HIGH (ref 65–99)
Glucose-Capillary: 219 mg/dL — ABNORMAL HIGH (ref 65–99)
Glucose-Capillary: 237 mg/dL — ABNORMAL HIGH (ref 65–99)

## 2017-06-09 NOTE — Progress Notes (Signed)
Orthopedic Tech Progress Note Patient Details:  Edwin Jones 11/08/1940 [id number]  Patient ID: Edwin Jones, male   DOB: June 25, 1940, 77 y.o.   MRN: [id number]   Edwin Jones 06/09/2017, 1:46 PM Called in [employer] brace order; spoke with Caryl Pina

## 2017-06-09 NOTE — Patient Care Conference (Signed)
Inpatient RehabilitationTeam Conference and Plan of Care Update Date: 06/09/2017   Time: 11:10 AM    Patient Name: Edwin Jones      Medical Record Number: [id number]  Date of Birth: Jun 22, 1940 Sex: Male         Room/Bed: 4W16C/4W16C-01 Payor Info: Payor: MEDICARE / Plan: MEDICARE PART A AND B / Product Type: *No Product type* /    Admitting Diagnosis: Stroke CVA  Admit Date/Time:  05/20/2017  4:30 PM Admission Comments: No comment available   Primary Diagnosis:  <principal problem not specified> Principal Problem: <principal problem not specified>  Patient Active Problem List   Diagnosis Date Noted  . SOB (shortness of breath) on exertion   . Hemiparesis affecting right side as late effect of stroke ([place])   . Aphasia as late effect of stroke   . Small cell lung cancer, left upper lobe ([place])   . Nontraumatic acute hemorrhage of basal ganglia ([place]) 05/17/2017  . Anterior cerebral circulation hemorrhagic infarction ([place]) 05/17/2017  . Pneumonia   . Cerebral edema ([facility])   . Cough   . Fever, unspecified   . Orthostatic hypotension   . Labile blood pressure   . Acute blood loss anemia   . Diabetes mellitus type 2 in nonobese ([facility])   . Labile blood glucose   . CKD (chronic kidney disease) 05/11/2017  . Infarction of left basal ganglia ([place]) 05/11/2017  . Idiopathic hypotension 05/09/2017  . Stroke ([place]) 05/06/2017  . Stroke (cerebrum) ([place]) 05/06/2017  . Acute ischemic left MCA stroke ([place]) 05/06/2017  . Cancer of upper lobe of left lung ([place]) 10/30/2016  . Pure hypercholesterolemia 06/22/2014  . GERD without esophagitis 06/22/2014  . Benign essential hypertension 06/22/2014  . Diabetes mellitus without complication ([place]) 23/76/2831    Expected Discharge Date: Expected Discharge Date: 06/15/17  Team Members Present: Physician leading conference: Dr. Alysia Penna Social Worker Present: Ovidio Kin, LCSW Nurse Present: Blair Heys, RN PT Present: Michaelene Song,  PT OT Present: Cherylynn Ridges, OT SLP Present: Weston Anna, SLP PPS Coordinator present : Daiva Nakayama, RN, CRRN     Current Status/Progress Goal Weekly Team Focus  Medical   Aphasia persists, affect brighter,  increase communication  d/c planning train wife if she decides to be caregiver   Bowel/Bladder   incontinent of B/B  Continent of B/B  timed toileting   Swallow/Nutrition/ Hydration   Dys. 2 textures with thin liquids, Min A   Min A  continued trials of upgraded textures    ADL's   Mod A overall, inconsitent, can be intermittent min A  Min A overall  R UE NMR, modified bathing/dressing, motor planning, standing balance, pt/family education   Mobility   min to mod assist for all mobility, gait up to 50 ft with RW, has done 6 steps (3 inch) mod assist, 4 inch curb mod assist  supervision, min assist for steps  NMR, balance, functional mobility, cognition   Communication   Mod-Max A  Min A   verbal expression at phrase level, increased vocal intensity, auditory comprehension   Safety/Cognition/ Behavioral Observations  Mod-Max A  Mod A   attention, problem solivng    Pain   Denies pain  Pain < 2  Assess qshift and PRN   Skin   Skin intact  Prevent skin breakdown and infection  Assess Qshift and PRN      *See Care Plan and progress notes for long and short-term goals.     Barriers to Discharge  Current Status/Progress Possible Resolutions Date Resolved   Physician    Inaccessible home environment;Decreased caregiver support  Exp aphasia, apraxia  Slow progress  Home vs SNF, cont ritalin for activation      Nursing                  PT  Inaccessible home environment;Decreased caregiver support                 OT                  SLP                SW                Discharge Planning/Teaching Needs:  Wife wants pt to stay here longer, feels making progress. Discussed going to NH short term and she is against this. She does not seem to understand the care pt  requires.      Team Discussion:  Progressing slowly in therapies. Inconsistent with his function and needs max cueing. Trials of Dys 3 diet. Timed toileting but still incontinent with B & B. Talking more in therapies. Need family education with wife to see if she is able to provide the care he will require at discharge.  Revisions to Treatment Plan:  DC 4/9    Continued Need for Acute Rehabilitation Level of Care: The patient requires daily medical management by a physician with specialized training in physical medicine and rehabilitation for the following conditions: Daily direction of a multidisciplinary physical rehabilitation program to ensure safe treatment while eliciting the highest outcome that is of practical value to the patient.: Yes Daily medical management of patient stability for increased activity during participation in an intensive rehabilitation regime.: Yes Daily analysis of laboratory values and/or radiology reports with any subsequent need for medication adjustment of medical intervention for : Neurological problems  Kaisei Gilbo, [name] 06/09/2017, 1:15 PM

## 2017-06-09 NOTE — Progress Notes (Signed)
Subjective/Complaints: Able to state name and hospital today   ROS: ? Y/N accuracy   Objective: Vital Signs: Blood pressure (!) 147/79, pulse 73, temperature 98 F (36.7 C), temperature source Oral, resp. rate 17, height 6\' 3"  (1.905 m), weight 90.3 kg (199 lb 1.2 oz), SpO2 100 %. No results found. Results for orders placed or performed during the hospital encounter of 05/20/17 (from the past 72 hour(s))  Glucose, capillary     Status: Abnormal   Collection Time: 06/06/17 11:31 AM  Result Value Ref Range   Glucose-Capillary 119 (H) 65 - 99 mg/dL  Glucose, capillary     Status: Abnormal   Collection Time: 06/06/17  4:41 PM  Result Value Ref Range   Glucose-Capillary 227 (H) 65 - 99 mg/dL  Glucose, capillary     Status: Abnormal   Collection Time: 06/06/17  9:05 PM  Result Value Ref Range   Glucose-Capillary 248 (H) 65 - 99 mg/dL  Glucose, capillary     Status: Abnormal   Collection Time: 06/07/17  6:37 AM  Result Value Ref Range   Glucose-Capillary 192 (H) 65 - 99 mg/dL  Glucose, capillary     Status: Abnormal   Collection Time: 06/07/17 12:02 PM  Result Value Ref Range   Glucose-Capillary 179 (H) 65 - 99 mg/dL  Glucose, capillary     Status: Abnormal   Collection Time: 06/07/17  4:44 PM  Result Value Ref Range   Glucose-Capillary 285 (H) 65 - 99 mg/dL  Glucose, capillary     Status: Abnormal   Collection Time: 06/07/17  8:44 PM  Result Value Ref Range   Glucose-Capillary 267 (H) 65 - 99 mg/dL  Glucose, capillary     Status: Abnormal   Collection Time: 06/08/17  6:29 AM  Result Value Ref Range   Glucose-Capillary 225 (H) 65 - 99 mg/dL  Glucose, capillary     Status: Abnormal   Collection Time: 06/08/17 12:02 PM  Result Value Ref Range   Glucose-Capillary 225 (H) 65 - 99 mg/dL  Glucose, capillary     Status: Abnormal   Collection Time: 06/08/17  4:50 PM  Result Value Ref Range   Glucose-Capillary 178 (H) 65 - 99 mg/dL  Glucose, capillary     Status: Abnormal   Collection Time: 06/08/17  9:22 PM  Result Value Ref Range   Glucose-Capillary 257 (H) 65 - 99 mg/dL  Glucose, capillary     Status: Abnormal   Collection Time: 06/09/17  6:35 AM  Result Value Ref Range   Glucose-Capillary 152 (H) 65 - 99 mg/dL     HEENT: poor dentition Cardio:RRR without murmur. No JVD  Resp: CTA Bilaterally without wheezes or rales. Normal effort  GI: BS positive and non tender Extremity:  No Edema Skin:   Intact and Other portacath site intact Neuro: lethargic and aphasic.  Abnormal Motor 3/5 RUE, 4/5 RLE , 5/5 on Left side,  Right inattention. Right facial droop Musc/Skel:  Other no pain with UE or LE ROM Gen NAD   Assessment/Plan: 1. 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 Physiatrist is providing close team supervision and 24 hour management of active medical problems listed below. Physiatrist and rehab team continue to assess barriers to discharge/monitor patient progress toward functional and medical goals. FIM: Function - Bathing Position: Wheelchair/chair at sink Body parts bathed by patient: Right arm, Left arm, Chest, Abdomen, Front perineal area, Left  lower leg, Right lower leg, Left upper leg, Right upper leg Body parts bathed by helper: Buttocks Assist Level: Touching or steadying assistance(Pt > 75%)  Function- Upper Body Dressing/Undressing What is the patient wearing?: Pull over shirt/dress Pull over shirt/dress - Perfomed by patient: Put head through opening, Thread/unthread left sleeve Pull over shirt/dress - Perfomed by helper: Thread/unthread right sleeve, Pull shirt over trunk Assist Level: Touching or steadying assistance(Pt > 75%) Function - Lower Body Dressing/Undressing What is the patient wearing?: Pants, Socks, Shoes, AFO Position: Wheelchair/chair at sink Underwear - Performed by patient: Pull  underwear up/down Underwear - Performed by helper: Thread/unthread right underwear leg, Thread/unthread left underwear leg Pants- Performed by patient: Thread/unthread left pants leg, Thread/unthread right pants leg Pants- Performed by helper: Pull pants up/down Non-skid slipper socks- Performed by helper: Don/doff right sock, Don/doff left sock Socks - Performed by helper: Don/doff right sock, Don/doff left sock Shoes - Performed by patient: Don/doff left shoe Shoes - Performed by helper: Don/doff right shoe, Don/doff left shoe, Fasten left, Fasten right AFO - Performed by helper: Don/doff right AFO Assist for footwear: Partial/moderate assist Assist for lower body dressing: Touching or steadying assistance (Pt > 75%)  Function - Toileting Toileting activity did not occur: No continent bowel/bladder event Toileting steps completed by helper: Adjust clothing prior to toileting, Performs perineal hygiene, Adjust clothing after toileting Toileting Assistive Devices: Other (comment)(stand up from wheelchair with aid of walker) Assist level: Touching or steadying assistance (Pt.75%)  Function - Toilet Transfers Toilet transfer activity did not occur: N/A Toilet transfer assistive device: Grab bar, Elevated toilet seat/BSC over toilet Mechanical lift: Stedy Assist level to toilet: Moderate assist (Pt 50 - 74%/lift or lower) Assist level from toilet: Moderate assist (Pt 50 - 74%/lift or lower)  Function - Chair/bed transfer Chair/bed transfer method: Stand pivot Chair/bed transfer assist level: Moderate assist (Pt 50 - 74%/lift or lower) Chair/bed transfer assistive device: Armrests Mechanical lift: Stedy Chair/bed transfer details: Manual facilitation for weight shifting, Manual facilitation for weight bearing, Verbal cues for technique  Function - Locomotion: Wheelchair Will patient use wheelchair at discharge?: Yes Type: Manual Max wheelchair distance: 150' Assist Level: Supervision  or verbal cues Assist Level: Supervision or verbal cues Assist Level: Supervision or verbal cues Turns around,maneuvers to table,bed, and toilet,negotiates 3% grade,maneuvers on rugs and over doorsills: No Function - Locomotion: Ambulation Assistive device: Walker-rolling, Orthosis Max distance: 55 ft Assist level: Moderate assist (Pt 50 - 74%) Assist level: Moderate assist (Pt 50 - 74%) Walk 50 feet with 2 turns activity did not occur: Safety/medical concerns Assist level: Moderate assist (Pt 50 - 74%) Walk 150 feet activity did not occur: Safety/medical concerns Walk 10 feet on uneven surfaces activity did not occur: Safety/medical concerns  Function - Comprehension Comprehension: Auditory Comprehension assist level: Understands basic 50 - 74% of the time/ requires cueing 25 - 49% of the time  Function - Expression Expression: Verbal Expression assist level: Expresses basis less than 25% of the time/requires cueing >75% of the time.  Function - Social Interaction Social Interaction assist level: Interacts appropriately 75 - 89% of the time - Needs redirection for appropriate language or to initiate interaction.  Function - Problem Solving Problem solving assist level: Solves basic 50 - 74% of the time/requires cueing 25 - 49% of the time  Function - Memory Memory assist level: Recognizes or recalls less than 25% of the time/requires cueing greater than 75% of the time Patient normally able to recall (first 3 days only):  That he or she is in a hospital, Staff names and faces  Medical Problem List and Plan: 1.   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  2. DVT Prophylaxis/Anticoagulation: Pharmaceutical:Heparin--continue 3. Pain Management:tylenol prn 4. Mood:LCSW to follow for evaluation when appropriate. 5. Neuropsych: This patientis notcapable of making decisions on hisown behalf. 6. Skin/Wound Care:routine  pressure relief measures. 7. Fluids/Electrolytes/Nutrition:Monitor I/O. Continue to offer supplements between meals. 8.Hypertension:Hypotensionhas resolved and patient offMidodrine and Florinef Vitals:   06/08/17 1530 06/09/17 0202  BP: 124/89 (!) 147/79  Pulse: 93 73  Resp:  17  Temp: 98 F (36.7 C) 98 F (36.7 C)  SpO2: 100% 100%  Controlled 4/3- monitor-Await orthostatic vitals 9. 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 at baseline BMP Latest Ref Rng & Units 06/04/2017 05/28/2017 05/21/2017  Glucose 65 - 99 mg/dL 188(H) 205(H) 198(H)  BUN 6 - 20 mg/dL 39(H) 38(H) 20  Creatinine 0.61 - 1.24 mg/dL 1.94(H) 1.90(H) 1.75(H)  Sodium 135 - 145 mmol/L 132(L) 132(L) 134(L)  Potassium 3.5 - 5.1 mmol/L 5.0 4.8 4.5  Chloride 101 - 111 mmol/L 100(L) 98(L) 102  CO2 22 - 32 mmol/L 20(L) 21(L) 21(L)  Calcium 8.9 - 10.3 mg/dL 9.6 9.2 8.9   10. ABLA: Monitor H/H for signs of bleedinghas been in 10- 11 range. 11 on 3/15  12. Lethargy: CVA related-improving    Trial Ritalin with some improvement in arousal noted, no tachycard will titrate up, discuss with team  13. Oral thrush:  nystatin mouthwash as well as oral care every 4 hours while awake. 14. COPD/Left lung cancer: On Breo with albuterol prn.  15.DM: poorly controlled     -SSI   -added low dose amaryl on 3/16, increase to 4mg  on 3/22-monitor, not a good candidate for Metformin due to CKD CBG (last 3)  Recent Labs    06/08/17 1650 06/08/17 2122 06/09/17 0635  GLUCAP 178* 257* 152*   Increase lantus,25U 4/1 , increase to 30U 4/2  Improved this am  LOS (Days) 20 A FACE TO FACE EVALUATION WAS PERFORMED  Charlett Blake 06/09/2017, 8:10 AM

## 2017-06-09 NOTE — Progress Notes (Signed)
Orthopedic Tech Progress Note Patient Details:  Edwin Jones 1940/10/27 [id number] Brace completed by [employer]. Patient ID: Edwin Jones, male   DOB: 07/20/1940, 77 y.o.   MRN: [id number]   Edwin Jones 06/09/2017, 4:47 PM

## 2017-06-09 NOTE — Progress Notes (Signed)
Physical Therapy Session Note  Patient Details  Name: Edwin Jones MRN: [id number] Date of Birth: 1940/06/16  Today's Date: 06/09/2017 PT Individual Time: 0801-0900 PT Individual Time Calculation (min): 59 min   Short Term Goals: Week 3:  PT Short Term Goal 1 (Week 3): Pt will negotiate 1 curb w/ RW w/ mod assist  PT Short Term Goal 2 (Week 3): Pt will perform bed mobility w/o use of hospital bed functions w/ min assist PT Short Term Goal 3 (Week 3): Pt will ambulate 25' w/ LRAD w/ min assist  Skilled Therapeutic Interventions/Progress Updates:    Pt supine in bed upon PT arrival, agreeable to therapy tx and denies pain. Pt transferred from supine>sitting EOB with min assist, pt performed sit<>stand without AD, min assist. Pt then ambulated from bed>bathroom with RW x 10 ft with min assist, verbal cues for R LE awareness, increased R LE step length, manual facilitation for L lateral weightshift and R hip flexion, increased cueing for turning to sit on toilet. Pt performed sit<>stands with grab bar and min assist, min assist for clothing management. Pt ambulated x 20 ft from bathroom out of room with RW and min assist, orthotist present for orthotic consult. Pt transported from room>gym with total assist. Pt ambulated 2 x 52 ft out of gym to chair in hallway, with trial of toe up brace, increased foot clearance noted, max verbal cues for increased R step length, manual facilitation for lateral weightshift, increased cues with turns. Pt transported back to room with needs in reach.   Therapy Documentation Precautions:  Precautions Precautions: Fall Precaution Comments: apraxic, R hemi, R inattention Restrictions Weight Bearing Restrictions: No   See Function Navigator for Current Functional Status.   Therapy/Group: Individual Therapy  Netta Corrigan, PT, DPT 06/09/2017, 8:54 AM

## 2017-06-09 NOTE — Plan of Care (Signed)
  Problem: RH BOWEL ELIMINATION Goal: RH STG MANAGE BOWEL WITH ASSISTANCE Description STG Manage Bowel with mod  Assistance.  Outcome: Progressing Flowsheets (Taken 06/09/2017 1206) STG: Pt will manage bowels with assistance: 1-Total assistance Goal: RH STG MANAGE BOWEL W/MEDICATION W/ASSISTANCE Description STG Manage Bowel with Medication with Assistance. Outcome: Progressing Flowsheets (Taken 06/09/2017 1206) STG: Pt will manage bowels with medication with assistance: 1-Total assistance   Problem: RH BLADDER ELIMINATION Goal: RH STG MANAGE BLADDER WITH ASSISTANCE Description STG Manage Bladder With mod Assistance  Outcome: Progressing Flowsheets (Taken 06/09/2017 1206) STG: Pt will manage bladder with assistance: 1-Total assistance   Problem: RH SKIN INTEGRITY Goal: RH STG SKIN FREE OF INFECTION/BREAKDOWN Description Skin free of infection/breakdown with min assistance  Outcome: Progressing Goal: RH STG MAINTAIN SKIN INTEGRITY WITH ASSISTANCE Description STG Maintain Skin Integrity With min Assistance.  Outcome: Progressing Flowsheets (Taken 06/09/2017 1206) STG: Maintain skin integrity with assistance: 4-Minimal assistance   Problem: RH SAFETY Goal: RH STG ADHERE TO SAFETY PRECAUTIONS W/ASSISTANCE/DEVICE Description STG Adhere to Safety Precautions With min  Assistance/Device.  Outcome: Progressing Flowsheets (Taken 06/09/2017 1206) STG:Pt will adhere to safety precautions with assistance/device: 4-Minimal assistance Goal: RH STG DECREASED RISK OF FALL WITH ASSISTANCE Description STG Decreased Risk of Fall With min Assistance.  Outcome: Progressing

## 2017-06-09 NOTE — Progress Notes (Signed)
Occupational Therapy Session Note  Patient Details  Name: Edwin Jones MRN: [id number] Date of Birth: 1940/03/14  Today's Date: 06/09/2017 OT Individual Time: 1001-1046 OT Individual Time Calculation (min): 45 min    Short Term Goals: Week 3:  OT Short Term Goal 1 (Week 3): STG=LTG 2/2 ELOS  Skilled Therapeutic Interventions/Progress Updates:    Pt received sitting up in w/c in room with no c/o pain and agreeable to therapy. Pt used RW to ambulate into bathroom with CGA. Pt able to remove B UE from RW and perform standing level clothing management and sit on BSC over toilet with CGA. Pt transferred to standing from toilet with use of grab bars and required mod A to don clothing from standing level and min A for standing level posterior peri-hygiene. Pt then ambulated back to w/c with min A. Pt performed UB bathing and dressing at w/c sink level with (S), requiring intermittent vc for thoroughness and follow through with task. Pt completed LB bathing and dressing with min A overall. Big improvement this session with verbal expression, with pt able to verbalize needs 5x throughout session. Pt left sitting in w/c with chair alarm activated and all needs met.   Therapy Documentation Precautions:  Precautions Precautions: Fall Precaution Comments: apraxic, R hemi, R inattention Restrictions Weight Bearing Restrictions: No  Pain: Pain Assessment Pain Scale: 0-10 Pain Score: 0-No pain Faces Pain Scale: No hurt ADL: ADL ADL Comments: Please see functional navigator  See Function Navigator for Current Functional Status.   Therapy/Group: Individual Therapy  Curtis Sites 06/09/2017, 12:16 PM

## 2017-06-09 NOTE — Progress Notes (Signed)
Social Work Patient ID: Edwin Jones, male   DOB: [birth year], 77 y.o.   MRN: [id number]  [name] with wife via telephone to discuss team conference progress and that MD feels not extending discharge date. We need wife to come in and do hands on care, have scheduled for Friday @ 10:00 to begin training. She reports pt's sister has passed away and wants MD's input on if she should tell him or not. MD feels she can tell him. Do not feel wife understands pt's care needs and his cognitive deficits. Will see how hand son training goes.

## 2017-06-09 NOTE — Plan of Care (Signed)
  Problem: RH BOWEL ELIMINATION Goal: RH STG MANAGE BOWEL WITH ASSISTANCE Description STG Manage Bowel with mod  Assistance.  Outcome: Progressing   Problem: RH BLADDER ELIMINATION Goal: RH STG MANAGE BLADDER WITH ASSISTANCE Description STG Manage Bladder With mod Assistance  Outcome: Progressing   Problem: RH SKIN INTEGRITY Goal: RH STG SKIN FREE OF INFECTION/BREAKDOWN Description Skin free of infection/breakdown with min assistance  Outcome: Progressing   Problem: RH SAFETY Goal: RH STG ADHERE TO SAFETY PRECAUTIONS W/ASSISTANCE/DEVICE Description STG Adhere to Safety Precautions With min  Assistance/Device.  Outcome: Progressing

## 2017-06-09 NOTE — Progress Notes (Signed)
Social Work   Jaylyne Breese, Eliezer Champagne  Social Worker  Physical Medicine and Rehabilitation  Patient Care Conference  Signed  Date of Service:  06/09/2017  1:15 PM          Signed          _0 Hide copied text  _1 Hover for details   Inpatient RehabilitationTeam Conference and Plan of Care Update Date: 06/09/2017   Time: 11:10 AM      Patient Name: Edwin Jones      Medical Record Number: [id number]  Date of Birth: [birth year] Sex: Male         Room/Bed: 4W16C/4W16C-01 Payor Info: Payor: MEDICARE / Plan: MEDICARE PART A AND B / Product Type: *No Product type* /     Admitting Diagnosis: Stroke CVA  Admit Date/Time:  05/20/2017  4:30 PM Admission Comments: No comment available    Primary Diagnosis:  <principal problem not specified> Principal Problem: <principal problem not specified>       Patient Active Problem List    Diagnosis Date Noted  . SOB (shortness of breath) on exertion    . Hemiparesis affecting right side as late effect of stroke ([place])    . Aphasia as late effect of stroke    . Small cell lung cancer, left upper lobe ([place])    . Nontraumatic acute hemorrhage of basal ganglia ([place]) 05/17/2017  . Anterior cerebral circulation hemorrhagic infarction ([place]) 05/17/2017  . Pneumonia    . Cerebral edema (HCC)    . Cough    . Fever, unspecified    . Orthostatic hypotension    . Labile blood pressure    . Acute blood loss anemia    . Diabetes mellitus type 2 in nonobese (HCC)    . Labile blood glucose    . CKD (chronic kidney disease) 05/11/2017  . 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 Idiopathic hypotension 05/09/2017  . Stroke ([place]) 05/06/2017  . Stroke (cerebrum) ([place]) 05/06/2017  . Acute ischemic left MCA stroke ([place]) 05/06/2017  . Cancer of upper lobe of left lung ([place]) 10/30/2016  . Pure hypercholesterolemia 06/22/2014  . GERD without esophagitis 06/22/2014  . Benign essential hypertension 06/22/2014  . Diabetes mellitus without  complication ([place]) 17/61/6073      Expected Discharge Date: Expected Discharge Date: 06/15/17   Team Members Present: Physician leading conference: Dr. Alysia Penna Social Worker Present: Ovidio Kin, LCSW Nurse Present: Blair Heys, RN PT Present: Michaelene Song, PT OT Present: Cherylynn Ridges, OT SLP Present: Weston Anna, SLP PPS Coordinator present : Daiva Nakayama, RN, CRRN       Current Status/Progress Goal Weekly Team Focus  Medical     Aphasia persists, affect brighter,  increase communication  d/c planning train wife if she decides to be caregiver   Bowel/Bladder     incontinent of B/B  Continent of B/B  timed toileting   Swallow/Nutrition/ Hydration     Dys. 2 textures with thin liquids, Min A   Min A  continued trials of upgraded textures    ADL's     Mod A overall, inconsitent, can be intermittent min A  Min A overall  R UE NMR, modified bathing/dressing, motor planning, standing balance, pt/family education   Mobility     min to mod assist for all mobility, gait up to 50 ft with RW, has done 6 steps (3 inch) mod assist, 4 inch curb mod assist  supervision, min assist for steps  NMR, balance, functional mobility,  cognition   Communication     Mod-Max A  Min A   verbal expression at phrase level, increased vocal intensity, auditory comprehension   Safety/Cognition/ Behavioral Observations   Mod-Max A  Mod A   attention, problem solivng    Pain     Denies pain  Pain < 2  Assess qshift and PRN   Skin     Skin intact  Prevent skin breakdown and infection  Assess Qshift and PRN     *See Care Plan and progress notes for long and short-term goals.      Barriers to Discharge   Current Status/Progress Possible Resolutions Date Resolved   Physician     Inaccessible home environment;Decreased caregiver support  Exp aphasia, apraxia  Slow progress  Home vs SNF, cont ritalin for activation      Nursing                 PT  Inaccessible home environment;Decreased  caregiver support                 OT                 SLP            SW              Discharge Planning/Teaching Needs:  Wife wants pt to stay here longer, feels making progress. Discussed going to NH short term and she is against this. She does not seem to understand the care pt requires.      Team Discussion:  Progressing slowly in therapies. Inconsistent with his function and needs max cueing. Trials of Dys 3 diet. Timed toileting but still incontinent with B & B. Talking more in therapies. Need family education with wife to see if she is able to provide the care he will require at discharge.  Revisions to Treatment Plan:  DC 4/9    Continued Need for Acute Rehabilitation Level of Care: The patient requires daily medical management by a physician with specialized training in physical medicine and rehabilitation for the following conditions: Daily direction of a multidisciplinary physical rehabilitation program to ensure safe treatment while eliciting the highest outcome that is of practical value to the patient.: Yes Daily medical management of patient stability for increased activity during participation in an intensive rehabilitation regime.: Yes Daily analysis of laboratory values and/or radiology reports with any subsequent need for medication adjustment of medical intervention for : Neurological problems   Baylei Siebels, [name] 06/09/2017, 1:15 PM                  Jacob Cicero, [name], LCSW  Social Worker  Physical Medicine and Rehabilitation  Patient Care Conference  Signed  Date of Service:  06/02/2017  1:30 PM          Signed          _0 Hide copied text  _1 Hover for details   Inpatient RehabilitationTeam Conference and Plan of Care Update Date: 06/02/2017   Time: 11:20 AM      Patient Name: Edwin Jones      Medical Record Number: [id number]  Date of Birth: May 17, 1940 Sex: Male         Room/Bed: 4W16C/4W16C-01 Payor Info: Payor: MEDICARE / Plan: MEDICARE  PART A AND B / Product Type: *No Product type* /     Admitting Diagnosis: Stroke CVA  Admit Date/Time:  05/20/2017  4:30 PM Admission Comments: No comment available    Primary  Diagnosis:  <principal problem not specified> Principal Problem: <principal problem not specified>       Patient Active Problem List    Diagnosis Date Noted  . SOB (shortness of breath) on exertion    . Hemiparesis affecting right side as late effect of stroke ([place])    . Aphasia as late effect of stroke    . Small cell lung cancer, left upper lobe ([place])    . Nontraumatic acute hemorrhage of basal ganglia ([place]) 05/17/2017  . Anterior cerebral circulation hemorrhagic infarction 05/17/2017  . Pneumonia    . Cerebral edema (HCC)    . Cough    . Fever, unspecified    . Orthostatic hypotension    . Labile blood pressure    . Acute blood loss anemia    . Diabetes mellitus type 2 in nonobese (HCC)    . Labile blood glucose    . CKD (chronic kidney disease) 05/11/2017  . 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 Idiopathic hypotension 05/09/2017  . Stroke ([place]) 05/06/2017  . Stroke (cerebrum) ([place]) 05/06/2017  . Acute ischemic left MCA stroke ([place]) 05/06/2017  . Cancer of upper lobe of left lung ([place]) 10/30/2016  . Pure hypercholesterolemia 06/22/2014  . GERD without esophagitis 06/22/2014  . Benign essential hypertension 06/22/2014  . Diabetes mellitus without complication ([place]) 95/63/8756      Expected Discharge Date: Expected Discharge Date: 06/15/17   Team Members Present: Physician leading conference: Dr. Alysia Penna Social Worker Present: Ovidio Kin, LCSW Nurse Present: Dorthula Nettles, RN PT Present: Michaelene Song, PT OT Present: Cherylynn Ridges, OT SLP Present: Weston Anna, SLP PPS Coordinator present : Daiva Nakayama, RN, CRRN       Current Status/Progress Goal Weekly Team Focus  Medical     aphasia, dysphagia, dysarthria , R HP, DM  CBG in goal range, avoid aspiration  DM  management, family teaching for Lantus administration   Bowel/Bladder     continent  with episodes of bladder incontinence, LBM 06/01/17  less episode of incontinence  timed toileting   Swallow/Nutrition/ Hydration     Dys. 2 textures with thin liquids, Min A  Min A  trials of upgraded textures    ADL's     (S) to mod A for UB ADLs, min A-mod A LB ADLs  min A overall  R UE NMR, ADL retraining, motor planning    Mobility     mod assist bed mobility, mod assist for sit<>stand and stand pivot transfers, min assist gait at rail x 30 ft, mod assist gait with RW 40 ft  supervision, min assist for steps  NMR, balance, functional mobility, cognition   Communication     Max A   Min A   auditory comprehension and verbal expression at the word and phrase level    Safety/Cognition/ Behavioral Observations             Pain     no c/o pain  pain scale <2  continue to assess & treat as needed   Skin     no signs of skin break down  no new areas of skin break down  assess q shift     *See Care Plan and progress notes for long and short-term goals.      Barriers to Discharge   Current Status/Progress Possible Resolutions Date Resolved   Physician     Medical stability     slow progress, see above  plan D/C in am  Nursing                 PT  Inaccessible home environment;Decreased caregiver support                 OT                 SLP            SW              Discharge Planning/Teaching Needs:  Wife upset with pt's second stroke and the deficits he has. She comes in the evenings but does plan to take him home if she is able      Team Discussion:  Making progress in therapies. R-inattention and needs cueing constantly. More continent with bladder and timed toileting. flucuates in his levels from hour to hour and time of day. Aphasic, apraxic and r-hemiparesis working on. Wife has been here and attended therapies with him, but still is somewhat unrealistic regarding pt's progress  and deficits.  Revisions to Treatment Plan:  DC 4/9    Continued Need for Acute Rehabilitation Level of Care: The patient requires daily medical management by a physician with specialized training in physical medicine and rehabilitation for the following conditions: Daily direction of a multidisciplinary physical rehabilitation program to ensure safe treatment while eliciting the highest outcome that is of practical value to the patient.: Yes Daily medical management of patient stability for increased activity during participation in an intensive rehabilitation regime.: Yes Daily analysis of laboratory values and/or radiology reports with any subsequent need for medication adjustment of medical intervention for : Diabetes problems;Neurological problems   Edwin Jones, [name] 06/02/2017, 1:30 PM                  Edwin Jones, [name], LCSW  Social Worker  Physical Medicine and Rehabilitation  Patient Care Conference  Signed  Date of Service:  05/26/2017  1:34 PM          Signed          _0 Hide copied text  _1 Hover for details   Inpatient RehabilitationTeam Conference and Plan of Care Update Date: 05/26/2017   Time: 11;20 AM      Patient Name: Edwin Jones      Medical Record Number: [id number]  Date of Birth: 14-Jan-1941 Sex: Male         Room/Bed: 4W16C/4W16C-01 Payor Info: Payor: MEDICARE / Plan: MEDICARE PART A AND B / Product Type: *No Product type* /     Admitting Diagnosis: Stroke CVA  Admit Date/Time:  05/20/2017  4:30 PM Admission Comments: No comment available    Primary Diagnosis:  <principal problem not specified> Principal Problem: <principal problem not specified>       Patient Active Problem List    Diagnosis Date Noted  . SOB (shortness of breath) on exertion    . Hemiparesis affecting right side as late effect of stroke ([place])    . Aphasia as late effect of stroke    . Small cell lung cancer, left upper lobe ([place])    . Nontraumatic acute hemorrhage  of basal ganglia ([place]) 05/17/2017  . Anterior cerebral circulation hemorrhagic infarction 05/17/2017  . Pneumonia    . Cerebral edema (HCC)    . Cough    . Fever, unspecified    . Orthostatic hypotension    . Labile blood pressure    . Acute blood loss anemia    . Diabetes mellitus type 2 in nonobese (  [place])    . Labile blood glucose    . CKD (chronic kidney disease) 05/11/2017  . 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 Idiopathic hypotension 05/09/2017  . Stroke ([place]) 05/06/2017  . Stroke (cerebrum) ([place]) 05/06/2017  . Acute ischemic left MCA stroke ([place]) 05/06/2017  . Cancer of upper lobe of left lung ([place]) 10/30/2016  . Pure hypercholesterolemia 06/22/2014  . GERD without esophagitis 06/22/2014  . Benign essential hypertension 06/22/2014  . Diabetes mellitus without complication ([place]) 03/00/9233      Expected Discharge Date: Expected Discharge Date: 06/15/17   Team Members Present: Physician leading conference: Dr. Alysia Penna Social Worker Present: Ovidio Kin, LCSW Nurse Present: Arelia Sneddon, RN PT Present: Dwyane Dee, PT;Emily Benn Moulder, PT OT Present: Willeen Cass, OT SLP Present: Weston Anna, SLP PPS Coordinator present : Daiva Nakayama, RN, CRRN       Current Status/Progress Goal Weekly Team Focus  Medical     poor communication, dysphagia, asp PNA resolved  avoid recurrent PNA  work on cough   Bowel/Bladder     incontinent of B/B; lbm 3/18  timed toileting  toilet while awake q3hrs   Swallow/Nutrition/ Hydration     Dys. 2 textures with thin liquids, Min-Mod A for use of strategies   Min A  use of strategies, possible trials of upgraded textures    ADL's     Max A overall, apraxia, R hemiplegia  Supervision, lofty  modified bathing/dressing, initiation, R UE NMR, R attention, motor planning   Mobility     max assist overall  lofty supervision  NMR, balance, functional mobility, cognition   Communication     Max A   Min A    basic auditory comprehension, yes/no accuracy, naming    Safety/Cognition/ Behavioral Observations   Max A  Mod A   problem solving    Pain     denies pain; faces=0  <2  assess q shift and prn   Skin     abd bruising;  free from skin breakdown and/or infection  assess q shift and prn     *See Care Plan and progress notes for long and short-term goals.      Barriers to Discharge   Current Status/Progress Possible Resolutions Date Resolved   Physician     Medical stability     Slow progress, some inmprovement with level of alertness  Cont rehab      Nursing                 PT                    OT                 SLP            SW Decreased caregiver support Wife has health issues of her own             Discharge Planning/Teaching Needs:  Wife wants to take him home, but has health issues of her own, so will depend upon how much care he requires. She is here daily and observed in therapies.      Team Discussion:  Goals supervision-min assist level. Working on timed toileting. Yes/no better and attention better. Dys 2 thin liquid diet. Fatigues easily and needs time to be allowed to do the tasks. Making good progress in therapies. Off antibiotics now for the pneumonia.  Revisions to Treatment Plan:  DC 4/9  Continued Need for Acute Rehabilitation Level of Care: The patient requires daily medical management by a physician with specialized training in physical medicine and rehabilitation for the following conditions: Daily direction of a multidisciplinary physical rehabilitation program to ensure safe treatment while eliciting the highest outcome that is of practical value to the patient.: Yes Daily medical management of patient stability for increased activity during participation in an intensive rehabilitation regime.: Yes Daily analysis of laboratory values and/or radiology reports with any subsequent need for medication adjustment of medical intervention for : Neurological  problems   Edwin Jones, [name] 05/26/2017, 1:35 PM                  Edwin Jones, [name], LCSW  Social Worker  Physical Medicine and Rehabilitation  Progress Notes  Signed  Date of Service:  05/12/2017  2:55 PM       Related encounter: Admission (Discharged) from 05/11/2017 in [facility]          _0 Hide copied text  _1 Hover for details   Social Work  Social Work Assessment and Plan   Patient Details  Name: Edwin Jones MRN: [id number] Date of Birth: 03-Apr-1940   Today's Date: 05/12/2017   Problem List:      Patient Active Problem List    Diagnosis Date Noted  . CKD (chronic kidney disease) 05/11/2017  . Infarction of left basal ganglia ([place]) 05/11/2017  . Idiopathic hypotension 05/09/2017  . Stroke ([place]) 05/06/2017  . Stroke (cerebrum) ([place]) 05/06/2017  . Acute ischemic left MCA stroke ([place]) 05/06/2017  . Cancer of upper lobe of left lung ([place]) 10/30/2016  . Pure hypercholesterolemia 06/22/2014  . GERD without esophagitis 06/22/2014  . Benign essential hypertension 06/22/2014  . Diabetes mellitus without complication ([place]) 29/93/7169    Past Medical History:      Past Medical History:  Diagnosis Date  . Cancer ([place])    . COPD (chronic obstructive pulmonary disease) ([place])    . Diabetes mellitus without complication ([place])    . GERD without esophagitis    . Hepatitis C virus      history of hep C treated with Harvoni  . Hypercholesterolemia    . Hypertension    . Seasonal allergies      Past Surgical History:       Past Surgical History:  Procedure Laterality Date  . COLONOSCOPY        approximately 11 years ago  . FLEXIBLE BRONCHOSCOPY N/A 11/05/2016    Procedure: FLEXIBLE BRONCHOSCOPY;  Surgeon: Wilhelmina Mcardle, MD;  Location: [facility] ORS;  Service: Pulmonary;  Laterality: N/A;  . IR FLUORO GUIDE CV LINE RIGHT   05/06/2017  . IR INTRAVSC STENT CERV CAROTID W/O EMB-PROT MOD SED INC ANGIO   05/06/2017  .  IR PERCUTANEOUS ART THROMBECTOMY/INFUSION INTRACRANIAL INC DIAG ANGIO   05/06/2017  . IR US GUIDE VASC ACCESS LEFT   05/06/2017  . IR US GUIDE VASC ACCESS RIGHT   05/06/2017  . IR US GUIDE VASC ACCESS RIGHT   05/06/2017  . PORTA CATH INSERTION N/A 11/23/2016    Procedure: Glori Luis Cath Insertion;  Surgeon: Algernon Huxley, MD;  Location: [facility];  Service: Cardiovascular;  Laterality: N/A;  . RADIOLOGY WITH ANESTHESIA N/A 05/06/2017    Procedure: IR WITH ANESTHESIA;  Surgeon: Radiologist, Medication, MD;  Location: [facility];  Service: Radiology;  Laterality: N/A;  . TEE WITHOUT CARDIOVERSION N/A 05/10/2017  Procedure: TRANSESOPHAGEAL ECHOCARDIOGRAM (TEE);  Surgeon: Sanda Klein, MD;  Location: [facility];  Service: Cardiovascular;  Laterality: N/A;  . TONSILLECTOMY        Social History:  reports that he quit smoking about 13 years ago. His smoking use included cigarettes. He has a 50.00 pack-year smoking history. he has never used smokeless tobacco. He reports that he does not drink alcohol or use drugs.   Family / Support Systems Marital Status: Married Patient Roles: Spouse, Parent, Other (Comment)(church deacon) Spouse/Significant Other: [name] [phone] Children: [name]-son  Other Supports: grandchildren are close and local Anticipated Caregiver: Wife Ability/Limitations of Caregiver: Wife has health issues and needs to be careful-heart issues and SOB at times Caregiver Availability: 24/7 Family Dynamics: Close knit family who are there for one another, they have a good church family who visit and send their prayers. Pt is one who is very independent and wants to take care of himself, he was even with chemo and rad treatments.   Social History Preferred language: English Religion: Baptist Cultural Background: No issues Education: [employer] Read: Yes Write: Yes Employment Status: Retired Freight forwarder Issues: No  issues Guardian/Conservator: None-according to MD pt is not fully capable of making decisions at this time will look toward his wife to make any decisions while here.    Abuse/Neglect Abuse/Neglect Assessment Can Be Completed: Yes Physical Abuse: Denies Verbal Abuse: Denies Sexual Abuse: Denies Exploitation of patient/patient's resources: Denies Self-Neglect: Denies   Emotional Status Pt's affect, behavior adn adjustment status: Pt is motivated to do well and will do anything the therapy team asks of him to make improvements. His wife is here and trying to help but needs to let the therapists do their evaluations. She is willing to help while here just like being at home Recent Psychosocial Issues: other health issues had just finished his rad and chemo treatments for his lung cancer prior to coming into the hospital Pyschiatric History: No history deferred depression screen due to doing well and still adjusting to the new unit and program. He is coping appropriately and is talking about his feelings. Will monitor and have neruo-psych see while here if would be beneficial Substance Abuse History: No issues-quit tobacco years ago   Patient / Family Perceptions, Expectations & Goals Pt/Family understanding of illness & functional limitations: Pt and wife can explain his stroke and deficits. They both have spoken with the MD and feel thier questions and concerns have been addressed. Wife plans to be here daily and ask the MD her questions that come up. She is not one to shy away from finding out the answers she seeks. 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, father, grandfather, retiree, church deacon, friend, etc Anticipated changes in roles/activities/participation: resume Pt/family expectations/goals: Pt states: " I want to be able to take care of myself before I leave here."  Wife states: " I hope he can get back to what he was doing before this, I need him too."   [employer]: Other (Comment)(Cancer [facility]) Premorbid Home Care/DME Agencies: None Transportation available at discharge: Wife Resource referrals recommended: Support group (specify)   Discharge Planning Living Arrangements: Spouse/significant other Support Systems: Spouse/significant other, Children, Other relatives, Friends/neighbors, Church/faith community Type of Residence: Private residence Insurance Resources: [employer] Financial Resources: Radio broadcast assistant Screen Referred: No Living Expenses: Rent Money Management: Patient, Spouse Does the patient have any problems obtaining your medications?: No Home Management: Wife Patient/Family Preliminary Plans: Return home with wife who is willing to  assist and maybe do too much for him at the expense of her own health issues. Glad she will be here daily and see him in therpaies so she will see what he can do for himself and what he will need assist with. Will await therapy team evaluations and work on discharge needs. Social Work Anticipated Follow Up Needs: Support Group, HH/OP   Clinical Impression Pleasant gentleman who is willing to push himself in therapies to achieve his goals of mod/i-supervision level. His wife is a strong support and tries to do too much for him, but feels she needs to do something while here. Will await therapy evaluations and work on a safe plan for him.WIll monitor to see if neuro-psych needed.    Elease Hashimoto 05/12/2017, 2:55 PM               Edwin Jones, [name], LCSW  Social Worker  Physical Medicine and Rehabilitation  Progress Notes  Signed  Date of Service:  05/12/2017  2:55 PM       Related encounter: Admission (Discharged) from 05/11/2017 in [facility]          _0 Hide copied text  _1 Hover for details   Social Work  Social Work Assessment and Plan   Patient Details  Name: Edwin Jones MRN:  [id number] Date of Birth: Feb 03, 1941   Today's Date: 05/12/2017   Problem List:      Patient Active Problem List    Diagnosis Date Noted  . CKD (chronic kidney disease) 05/11/2017  . Infarction of left basal ganglia ([place]) 05/11/2017  . Idiopathic hypotension 05/09/2017  . Stroke ([place]) 05/06/2017  . Stroke (cerebrum) ([place]) 05/06/2017  . Acute ischemic left MCA stroke ([place]) 05/06/2017  . Cancer of upper lobe of left lung ([place]) 10/30/2016  . Pure hypercholesterolemia 06/22/2014  . GERD without esophagitis 06/22/2014  . Benign essential hypertension 06/22/2014  . Diabetes mellitus without complication ([place]) 56/38/9373    Past Medical History:      Past Medical History:  Diagnosis Date  . Cancer ([place])    . COPD (chronic obstructive pulmonary disease) ([place])    . Diabetes mellitus without complication ([place])    . GERD without esophagitis    . Hepatitis C virus      history of hep C treated with Harvoni  . Hypercholesterolemia    . Hypertension    . Seasonal allergies      Past Surgical History:       Past Surgical History:  Procedure Laterality Date  . COLONOSCOPY        approximately 11 years ago  . FLEXIBLE BRONCHOSCOPY N/A 11/05/2016    Procedure: FLEXIBLE BRONCHOSCOPY;  Surgeon: Wilhelmina Mcardle, MD;  Location: [facility] ORS;  Service: Pulmonary;  Laterality: N/A;  . IR FLUORO GUIDE CV LINE RIGHT   05/06/2017  . IR INTRAVSC STENT CERV CAROTID W/O EMB-PROT MOD SED INC ANGIO   05/06/2017  . IR PERCUTANEOUS ART THROMBECTOMY/INFUSION INTRACRANIAL INC DIAG ANGIO   05/06/2017  . IR US GUIDE VASC ACCESS LEFT   05/06/2017  . IR US GUIDE VASC ACCESS RIGHT   05/06/2017  . IR US GUIDE VASC ACCESS RIGHT   05/06/2017  . PORTA CATH INSERTION N/A 11/23/2016    Procedure: Glori Luis Cath Insertion;  Surgeon: Algernon Huxley, MD;  Location: [place] [facility];  Service: Cardiovascular;  Laterality: N/A;  . RADIOLOGY WITH ANESTHESIA N/A 05/06/2017  Procedure: IR WITH ANESTHESIA;  Surgeon: Radiologist,  Medication, MD;  Location: [employer];  Service: Radiology;  Laterality: N/A;  . TEE WITHOUT CARDIOVERSION N/A 05/10/2017    Procedure: TRANSESOPHAGEAL ECHOCARDIOGRAM (TEE);  Surgeon: Sanda Klein, MD;  Location: [facility] ENDOSCOPY;  Service: Cardiovascular;  Laterality: N/A;  . TONSILLECTOMY        Social History:  reports that he quit smoking about 13 years ago. His smoking use included cigarettes. He has a 50.00 pack-year smoking history. he has never used smokeless tobacco. He reports that he does not drink alcohol or use drugs.   Family / Support Systems Marital Status: Married Patient Roles: Spouse, Parent, Other (Comment)(church deacon) Spouse/Significant Other: [name] [phone] Children: [name]-son  Other Supports: grandchildren are close and local Anticipated Caregiver: Wife Ability/Limitations of Caregiver: Wife has health issues and needs to be careful-heart issues and SOB at times Caregiver Availability: 24/7 Family Dynamics: Close knit family who are there for one another, they have a good church family who visit and send their prayers. Pt is one who is very independent and wants to take care of himself, he was even with chemo and rad treatments.   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    Abuse/Neglect Abuse/Neglect Assessment Can Be Completed: Yes Physical Abuse: Denies Verbal Abuse: Denies Sexual Abuse: Denies Exploitation of patient/patient's resources: Denies Self-Neglect: Denies   Emotional Status Pt's affect, behavior adn adjustment status: Pt is motivated to do well and will do anything the therapy team asks of him to  make improvements. His wife is here and trying to help but needs to let the therapists do their evaluations. She is willing to help while here just like being at home Recent Psychosocial Issues: other health issues had just finished his rad and chemo treatments for his lung cancer prior to coming into the hospital Pyschiatric History: No history deferred depression screen due to doing well and still adjusting to the new unit and program. He is coping appropriately and is talking about his feelings. Will monitor and have neruo-psych see while here if would be beneficial Substance Abuse History: No issues-quit tobacco years ago   Patient / Family Perceptions, Expectations & Goals Pt/Family understanding of illness & functional limitations: Pt and wife can explain his stroke and deficits. They both have spoken with the MD and feel thier questions and concerns have been addressed. Wife plans to be here daily and ask the MD her questions that come up. She is not one to shy away from finding out the answers she seeks. 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, father, grandfather, retiree, church deacon, friend, etc Anticipated changes in roles/activities/participation: resume Pt/family expectations/goals: Pt states: " I want to be able to take care of myself before I leave here."  Wife states: " I hope he can get back to what he was doing before this, I need him too."   [employer]: Other (Comment)(Cancer [facility]) Premorbid Home Care/DME Agencies: None Transportation available at discharge: Wife Resource referrals recommended: Support group (specify)   Discharge Planning Living Arrangements: Spouse/significant other Support Systems: Spouse/significant other, Children, Other relatives, Friends/neighbors, Social worker community Type of Residence: Private residence Insurance Resources: [employer] Financial Resources: Normal Referred:  No Living  Expenses: Rent Money Management: Patient, Spouse Does the patient have any problems obtaining your medications?: No Home Management: Wife Patient/Family Preliminary Plans: Return home with wife who is willing to assist and maybe do too much for him at the expense of her own health issues. Glad she will be here daily and see him in therpaies so she will see what he can do for himself and what he will need assist with. Will await therapy team evaluations and work on discharge needs. Social Work Anticipated Follow Up Needs: Support Group, HH/OP   Clinical Impression Pleasant gentleman who is willing to push himself in therapies to achieve his goals of mod/i-supervision level. His wife is a strong support and tries to do too much for him, but feels she needs to do something while here. Will await therapy evaluations and work on a safe plan for him.WIll monitor to see if neuro-psych needed.    Elease Hashimoto 05/12/2017, 2:55 PM               Edwin Jones, [name], LCSW  Social Worker  Physical Medicine and Rehabilitation  Progress Notes  Signed  Date of Service:  05/12/2017  2:55 PM       Related encounter: Admission (Discharged) from 05/11/2017 in [place]          _0 Hide copied text  _1 Hover for details   Social Work  Social Work Assessment and Plan   Patient Details  Name: Kilan Banfill MRN: [id number] Date of Birth: Jun 02, 1940   Today's Date: 05/12/2017   Problem List:      Patient Active Problem List    Diagnosis Date Noted  . CKD (chronic kidney disease) 05/11/2017  . Infarction of left basal ganglia ([place]) 05/11/2017  . Idiopathic hypotension 05/09/2017  . Stroke ([place]) 05/06/2017  . Stroke (cerebrum) ([place]) 05/06/2017  . Acute ischemic left MCA stroke ([place]) 05/06/2017  . Cancer of upper lobe of left lung ([place]) 10/30/2016  . Pure hypercholesterolemia 06/22/2014  . GERD without esophagitis 06/22/2014  .  Benign essential hypertension 06/22/2014  . Diabetes mellitus without complication ([place]) 62/83/1517    Past Medical History:      Past Medical History:  Diagnosis Date  . Cancer ([place])    . COPD (chronic obstructive pulmonary disease) ([place])    . Diabetes mellitus without complication ([place])    . GERD without esophagitis    . Hepatitis C virus      history of hep C treated with Harvoni  . Hypercholesterolemia    . Hypertension    . Seasonal allergies      Past Surgical History:       Past Surgical History:  Procedure Laterality Date  . COLONOSCOPY        approximately 11 years ago  . FLEXIBLE BRONCHOSCOPY N/A 11/05/2016    Procedure: FLEXIBLE BRONCHOSCOPY;  Surgeon: Wilhelmina Mcardle, MD;  Location: [facility] ORS;  Service: Pulmonary;  Laterality: N/A;  . IR FLUORO GUIDE CV LINE RIGHT   05/06/2017  . IR INTRAVSC STENT CERV CAROTID W/O EMB-PROT MOD SED INC ANGIO   05/06/2017  . IR PERCUTANEOUS ART THROMBECTOMY/INFUSION INTRACRANIAL INC DIAG ANGIO   05/06/2017  . IR US GUIDE VASC ACCESS LEFT   05/06/2017  . IR US GUIDE VASC ACCESS RIGHT   05/06/2017  . IR US GUIDE VASC ACCESS RIGHT   05/06/2017  . PORTA CATH INSERTION N/A 11/23/2016    Procedure:  Porta Cath Insertion;  Surgeon: Algernon Huxley, MD;  Location: [place] [facility];  Service: Cardiovascular;  Laterality: N/A;  . RADIOLOGY WITH ANESTHESIA N/A 05/06/2017    Procedure: IR WITH ANESTHESIA;  Surgeon: Radiologist, Medication, MD;  Location: [place];  Service: Radiology;  Laterality: N/A;  . TEE WITHOUT CARDIOVERSION N/A 05/10/2017    Procedure: TRANSESOPHAGEAL ECHOCARDIOGRAM (TEE);  Surgeon: Sanda Klein, MD;  Location: [facility] ENDOSCOPY;  Service: Cardiovascular;  Laterality: N/A;  . TONSILLECTOMY        Social History:  reports that he quit smoking about 13 years ago. His smoking use included cigarettes. He has a 50.00 pack-year smoking history. he has never used smokeless tobacco. He reports that he does not drink alcohol or use drugs.    Family / Support Systems Marital Status: Married Patient Roles: Spouse, Parent, Other (Comment)(church deacon) Spouse/Significant Other: [name] [phone] Children: [name]-son  Other Supports: grandchildren are close and local Anticipated Caregiver: Wife Ability/Limitations of Caregiver: Wife has health issues and needs to be careful-heart issues and SOB at times Caregiver Availability: 24/7 Family Dynamics: Close knit family who are there for one another, they have a good church family who visit and send their prayers. Pt is one who is very independent and wants to take care of himself, he was even with chemo and rad treatments.   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    Abuse/Neglect Abuse/Neglect Assessment Can Be Completed: Yes Physical Abuse: Denies Verbal Abuse: Denies Sexual Abuse: Denies Exploitation of patient/patient's resources: Denies Self-Neglect: Denies   Emotional Status Pt's affect, behavior adn adjustment status: Pt is motivated to do well and will do anything the therapy team asks of him to make improvements. His wife is here and trying to help but needs to let the therapists do their evaluations. She is willing to help while here just like being at home Recent Psychosocial Issues: other health issues had just finished his rad and chemo treatments for his lung cancer prior to coming into the hospital Pyschiatric History: No history deferred depression screen due to doing well and still adjusting to the new unit and program. He is coping appropriately and is talking about his feelings. Will monitor and have  neruo-psych see while here if would be beneficial Substance Abuse History: No issues-quit tobacco years ago   Patient / Family Perceptions, Expectations & Goals Pt/Family understanding of illness & functional limitations: Pt and wife can explain his stroke and deficits. They both have spoken with the MD and feel thier questions and concerns have been addressed. Wife plans to be here daily and ask the MD her questions that come up. She is not one to shy away from finding out the answers she seeks. 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, father, grandfather, retiree, church deacon, friend, etc Anticipated changes in roles/activities/participation: resume Pt/family expectations/goals: Pt states: " I want to be able to take care of myself before I leave here."  Wife states: " I hope he can get back to what he was doing before this, I need him too."   [employer]: Other (Comment)(Cancer [facility]) Premorbid Home Care/DME Agencies: None Transportation available at discharge: Wife Resource referrals recommended: Support group (specify)   Discharge Planning Living  Arrangements: Spouse/significant other Support Systems: Spouse/significant other, Children, Other relatives, Friends/neighbors, Church/faith community Type of Residence: Private residence Insurance Resources: [employer] Financial Resources: Radio broadcast assistant Screen Referred: No Living Expenses: Education officer, community Management: Patient, Spouse Does the patient have any problems obtaining your medications?: No Home Management: Wife Patient/Family Preliminary Plans: Return home with wife who is willing to assist and maybe do too much for him at the expense of her own health issues. Glad she will be here daily and see him in therpaies so she will see what he can do for himself and what he will need assist with. Will await therapy team evaluations and work on discharge needs. Social Work Anticipated Follow Up  Needs: Support Group, HH/OP   Clinical Impression Pleasant gentleman who is willing to push himself in therapies to achieve his goals of mod/i-supervision level. His wife is a strong support and tries to do too much for him, but feels she needs to do something while here. Will await therapy evaluations and work on a safe plan for him.WIll monitor to see if neuro-psych needed.    Elease Hashimoto 05/12/2017, 2:55 PM               Edwin Jones, [name], LCSW  Social Worker  Physical Medicine and Rehabilitation  Progress Notes  Signed  Date of Service:  05/12/2017  2:55 PM       Related encounter: Admission (Discharged) from 05/11/2017 in [place]          _0 Hide copied text  _1 Hover for details   Social Work  Social Work Assessment and Plan   Patient Details  Name: Edwin Jones MRN: [id number] Date of Birth: Nov 03, 1940   Today's Date: 05/12/2017   Problem List:      Patient Active Problem List    Diagnosis Date Noted  . CKD (chronic kidney disease) 05/11/2017  . Infarction of left basal ganglia ([place]) 05/11/2017  . Idiopathic hypotension 05/09/2017  . Stroke ([place]) 05/06/2017  . Stroke (cerebrum) ([place]) 05/06/2017  . Acute ischemic left MCA stroke ([place]) 05/06/2017  . Cancer of upper lobe of left lung ([place]) 10/30/2016  . Pure hypercholesterolemia 06/22/2014  . GERD without esophagitis 06/22/2014  . Benign essential hypertension 06/22/2014  . Diabetes mellitus without complication ([place]) 70/62/3762    Past Medical History:      Past Medical History:  Diagnosis Date  . Cancer ([place])    . COPD (chronic obstructive pulmonary disease) ([place])    . Diabetes mellitus without complication ([place])    . GERD without esophagitis    . Hepatitis C virus      history of hep C treated with Harvoni  . Hypercholesterolemia    . Hypertension    . Seasonal allergies      Past Surgical History:       Past Surgical History:   Procedure Laterality Date  . COLONOSCOPY        approximately 11 years ago  . FLEXIBLE BRONCHOSCOPY N/A 11/05/2016    Procedure: FLEXIBLE BRONCHOSCOPY;  Surgeon: Wilhelmina Mcardle, MD;  Location: [facility] ORS;  Service: Pulmonary;  Laterality: N/A;  . IR FLUORO GUIDE CV LINE RIGHT   05/06/2017  . IR INTRAVSC STENT CERV CAROTID W/O EMB-PROT MOD SED INC ANGIO   05/06/2017  . IR PERCUTANEOUS ART THROMBECTOMY/INFUSION INTRACRANIAL INC DIAG ANGIO   05/06/2017  . IR US GUIDE VASC ACCESS LEFT   05/06/2017  .  IR US GUIDE VASC ACCESS RIGHT   05/06/2017  . IR US GUIDE VASC ACCESS RIGHT   05/06/2017  . PORTA CATH INSERTION N/A 11/23/2016    Procedure: Glori Luis Cath Insertion;  Surgeon: Algernon Huxley, MD;  Location: [place] [facility];  Service: Cardiovascular;  Laterality: N/A;  . RADIOLOGY WITH ANESTHESIA N/A 05/06/2017    Procedure: IR WITH ANESTHESIA;  Surgeon: Radiologist, Medication, MD;  Location: [place];  Service: Radiology;  Laterality: N/A;  . TEE WITHOUT CARDIOVERSION N/A 05/10/2017    Procedure: TRANSESOPHAGEAL ECHOCARDIOGRAM (TEE);  Surgeon: Sanda Klein, MD;  Location: [facility] ENDOSCOPY;  Service: Cardiovascular;  Laterality: N/A;  . TONSILLECTOMY        Social History:  reports that he quit smoking about 13 years ago. His smoking use included cigarettes. He has a 50.00 pack-year smoking history. he has never used smokeless tobacco. He reports that he does not drink alcohol or use drugs.   Family / Support Systems Marital Status: Married Patient Roles: Spouse, Parent, Other (Comment)(church deacon) Spouse/Significant Other: [name] [phone] Children: [name]-son  Other Supports: grandchildren are close and local Anticipated Caregiver: Wife Ability/Limitations of Caregiver: Wife has health issues and needs to be careful-heart issues and SOB at times Caregiver Availability: 24/7 Family Dynamics: Close knit family who are there for one another, they have a good church family who  visit and send their prayers. Pt is one who is very independent and wants to take care of himself, he was even with chemo and rad treatments.   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    Abuse/Neglect Abuse/Neglect Assessment Can Be Completed: Yes Physical Abuse: Denies Verbal Abuse: Denies Sexual Abuse: Denies Exploitation of patient/patient's resources: Denies Self-Neglect: Denies   Emotional Status Pt's affect, behavior adn adjustment status: Pt is motivated to do well and will do anything the therapy team asks of him to make improvements. His wife is here and trying to help but needs to let the therapists do their evaluations. She is willing to help while here just like being at home Recent Psychosocial Issues: other health issues had just finished his rad and chemo treatments for his lung cancer prior to coming into the hospital Pyschiatric History: No history deferred depression screen due to doing well and still adjusting to the new unit and program. He is coping appropriately and is talking about his feelings. Will monitor and have neruo-psych see while here if would be beneficial Substance Abuse History: No issues-quit tobacco years ago   Patient / Family Perceptions, Expectations & Goals Pt/Family understanding of illness & functional limitations: Pt and wife can explain his stroke and deficits. They both have spoken with the MD and feel thier questions and concerns have been addressed. Wife plans to be here daily and ask the MD her questions that come up. She is not one to shy away from finding out the answers she seeks. Premorbid pt/family  roles/activities: Husband, father, grandfather, retiree, church deacon, friend, etc Anticipated changes in roles/activities/participation: resume Pt/family expectations/goals: Pt states: " I want to be able to take care of myself before I leave here."  Wife states: " I hope he can get back to what he was doing before this, I need him  too."   [employer]: Other (Comment)(Cancer [facility]) Premorbid Home Care/DME Agencies: None Transportation available at discharge: Wife Resource referrals recommended: Support group (specify)   Discharge Planning Living Arrangements: Spouse/significant other Support Systems: Spouse/significant other, Children, Other relatives, Friends/neighbors, Social worker community Type of Residence: Private residence Insurance Resources: [employer] Financial Resources: Radio broadcast assistant Screen Referred: No Living Expenses: Education officer, community Management: Patient, Spouse Does the patient have any problems obtaining your medications?: No Home Management: Wife Patient/Family Preliminary Plans: Return home with wife who is willing to assist and maybe do too much for him at the expense of her own health issues. Glad she will be here daily and see him in therpaies so she will see what he can do for himself and what he will need assist with. Will await therapy team evaluations and work on discharge needs. Social Work Anticipated Follow Up Needs: Support Group, HH/OP   Clinical Impression Pleasant gentleman who is willing to push himself in therapies to achieve his goals of mod/i-supervision level. His wife is a strong support and tries to do too much for him, but feels she needs to do something while here. Will await therapy evaluations and work on a safe plan for him.WIll monitor to see if neuro-psych needed.    Elease Hashimoto 05/12/2017, 2:55 PM               Edwin Jones, [name], LCSW  Social Worker  Physical Medicine and Rehabilitation   Progress Notes  Signed  Date of Service:  05/12/2017  2:55 PM       Related encounter: Admission (Discharged) from 05/11/2017 in [place]          _0 Hide copied text  _1 Hover for details   Social Work  Social Work Assessment and Plan   Patient Details  Name: Edwin Jones MRN: [id number] Date of Birth: 1940-12-02   Today's Date: 05/12/2017   Problem List:      Patient Active Problem List    Diagnosis Date Noted  . CKD (chronic kidney disease) 05/11/2017  . Infarction of left basal ganglia ([place]) 05/11/2017  . Idiopathic hypotension 05/09/2017  . Stroke ([place]) 05/06/2017  . Stroke (cerebrum) ([place]) 05/06/2017  . Acute ischemic left MCA stroke ([place]) 05/06/2017  . Cancer of upper lobe of left lung ([place]) 10/30/2016  . Pure hypercholesterolemia 06/22/2014  . GERD without esophagitis 06/22/2014  . Benign essential hypertension 06/22/2014  . Diabetes mellitus without complication ([place]) 74/02/8785    Past Medical History:      Past Medical History:  Diagnosis Date  . Cancer ([place])    . COPD (chronic obstructive pulmonary disease) ([place])    . Diabetes mellitus without complication ([place])    . GERD without esophagitis    . Hepatitis C virus      history of hep C treated with Harvoni  . Hypercholesterolemia    . Hypertension    . Seasonal allergies      Past Surgical History:       Past Surgical History:  Procedure Laterality Date  . COLONOSCOPY        approximately 11 years ago  . FLEXIBLE BRONCHOSCOPY N/A 11/05/2016    Procedure: FLEXIBLE BRONCHOSCOPY;  Surgeon: Wilhelmina Mcardle, MD;  Location: [facility] ORS;  Service: Pulmonary;  Laterality: N/A;  . IR FLUORO GUIDE CV LINE RIGHT   05/06/2017  . IR INTRAVSC STENT CERV CAROTID W/O EMB-PROT MOD SED INC  ANGIO   05/06/2017  . IR PERCUTANEOUS ART THROMBECTOMY/INFUSION INTRACRANIAL INC DIAG ANGIO   05/06/2017  . IR US GUIDE VASC ACCESS LEFT   05/06/2017  . IR US GUIDE VASC ACCESS  RIGHT   05/06/2017  . IR US GUIDE VASC ACCESS RIGHT   05/06/2017  . PORTA CATH INSERTION N/A 11/23/2016    Procedure: Glori Luis Cath Insertion;  Surgeon: Algernon Huxley, MD;  Location: [place] [facility];  Service: Cardiovascular;  Laterality: N/A;  . RADIOLOGY WITH ANESTHESIA N/A 05/06/2017    Procedure: IR WITH ANESTHESIA;  Surgeon: Radiologist, Medication, MD;  Location: [place];  Service: Radiology;  Laterality: N/A;  . TEE WITHOUT CARDIOVERSION N/A 05/10/2017    Procedure: TRANSESOPHAGEAL ECHOCARDIOGRAM (TEE);  Surgeon: Sanda Klein, MD;  Location: [facility] ENDOSCOPY;  Service: Cardiovascular;  Laterality: N/A;  . TONSILLECTOMY        Social History:  reports that he quit smoking about 13 years ago. His smoking use included cigarettes. He has a 50.00 pack-year smoking history. he has never used smokeless tobacco. He reports that he does not drink alcohol or use drugs.   Family / Support Systems Marital Status: Married Patient Roles: Spouse, Parent, Other (Comment)(church deacon) Spouse/Significant Other: [name] [phone] Children: [name]-son  Other Supports: grandchildren are close and local Anticipated Caregiver: Wife Ability/Limitations of Caregiver: Wife has health issues and needs to be careful-heart issues and SOB at times Caregiver Availability: 24/7 Family Dynamics: Close knit family who are there for one another, they have a good church family who visit and send their prayers. Pt is one who is very independent and wants to take care of himself, he was even with chemo and rad treatments.   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     Abuse/Neglect Abuse/Neglect Assessment Can Be Completed: Yes Physical Abuse: Denies Verbal Abuse: Denies Sexual Abuse: Denies Exploitation of patient/patient's resources: Denies Self-Neglect: Denies   Emotional Status Pt's affect, behavior adn adjustment status: Pt is motivated to do well and will do anything the therapy team asks of him to make improvements. His wife is here and trying to help but needs to let the therapists do their evaluations. She is willing to help while here just like being at home Recent Psychosocial Issues: other health issues had just finished his rad and chemo treatments for his lung cancer prior to coming into the hospital Pyschiatric History: No history deferred depression screen due to doing well and still adjusting to the new unit and program. He is coping appropriately and is talking about his feelings. Will monitor and have neruo-psych see while here if would be beneficial Substance Abuse History: No issues-quit tobacco years ago   Patient / Family Perceptions, Expectations & Goals Pt/Family understanding of illness & functional limitations: Pt and wife can explain his stroke and deficits. They both have spoken with the MD and feel thier questions and concerns have been addressed. Wife plans to be here daily and ask the MD her questions that come up. She is not one to shy away from finding out the answers she seeks. 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, father, grandfather, retiree, church deacon, friend, etc Anticipated changes in roles/activities/participation: resume Pt/family expectations/goals: Pt states: " I want to be  able to take care of myself before I leave here."  Wife states: " I hope he can get back to what he was doing before this, I need him too."   [employer]: Other (Comment)(Cancer [facility]) Premorbid Home Care/DME Agencies: None Transportation available at discharge: Wife Resource referrals  recommended: Support group (specify)   Discharge Planning Living Arrangements: Spouse/significant other Support Systems: Spouse/significant other, Children, Other relatives, Friends/neighbors, Social worker community Type of Residence: Private residence Insurance Resources: [employer] Financial Resources: Radio broadcast assistant Screen Referred: No Living Expenses: Education officer, community Management: Patient, Spouse Does the patient have any problems obtaining your medications?: No Home Management: Wife Patient/Family Preliminary Plans: Return home with wife who is willing to assist and maybe do too much for him at the expense of her own health issues. Glad she will be here daily and see him in therpaies so she will see what he can do for himself and what he will need assist with. Will await therapy team evaluations and work on discharge needs. Social Work Anticipated Follow Up Needs: Support Group, HH/OP   Clinical Impression Pleasant gentleman who is willing to push himself in therapies to achieve his goals of mod/i-supervision level. His wife is a strong support and tries to do too much for him, but feels she needs to do something while here. Will await therapy evaluations and work on a safe plan for him.WIll monitor to see if neuro-psych needed.    Elease Hashimoto 05/12/2017, 2:55 PM               Edwin Jones, [name], LCSW  Social Worker  Physical Medicine and Rehabilitation  Progress Notes  Signed  Date of Service:  05/12/2017  2:55 PM       Related encounter: Admission (Discharged) from 05/11/2017 in [place]          _0 Hide copied text  _1 Hover for details   Social Work  Social Work Assessment and Plan   Patient Details  Name: Edwin Jones MRN: [id number] Date of Birth: 1940/09/30   Today's Date: 05/12/2017   Problem List:      Patient Active Problem List    Diagnosis Date Noted  . CKD (chronic kidney disease)  05/11/2017  . Infarction of left basal ganglia ([place]) 05/11/2017  . Idiopathic hypotension 05/09/2017  . Stroke ([place]) 05/06/2017  . Stroke (cerebrum) ([place]) 05/06/2017  . Acute ischemic left MCA stroke ([place]) 05/06/2017  . Cancer of upper lobe of left lung ([place]) 10/30/2016  . Pure hypercholesterolemia 06/22/2014  . GERD without esophagitis 06/22/2014  . Benign essential hypertension 06/22/2014  . Diabetes mellitus without complication ([place]) 62/22/9798    Past Medical History:      Past Medical History:  Diagnosis Date  . Cancer ([place])    . COPD (chronic obstructive pulmonary disease) ([place])    . Diabetes mellitus without complication ([place])    . GERD without esophagitis    . Hepatitis C virus      history of hep C treated with Harvoni  . Hypercholesterolemia    . Hypertension    . Seasonal allergies      Past Surgical History:       Past Surgical History:  Procedure Laterality Date  . COLONOSCOPY        approximately 11 years ago  . FLEXIBLE BRONCHOSCOPY N/A 11/05/2016    Procedure: FLEXIBLE BRONCHOSCOPY;  Surgeon: Wilhelmina Mcardle, MD;  Location:  [facility] ORS;  Service: Pulmonary;  Laterality: N/A;  . IR FLUORO GUIDE CV LINE RIGHT   05/06/2017  . IR INTRAVSC STENT CERV CAROTID W/O EMB-PROT MOD SED INC ANGIO   05/06/2017  . IR PERCUTANEOUS ART THROMBECTOMY/INFUSION INTRACRANIAL INC DIAG ANGIO   05/06/2017  . IR US GUIDE VASC ACCESS LEFT   05/06/2017  . IR US GUIDE VASC ACCESS RIGHT   05/06/2017  . IR US GUIDE VASC ACCESS RIGHT   05/06/2017  . PORTA CATH INSERTION N/A 11/23/2016    Procedure: Glori Luis Cath Insertion;  Surgeon: Algernon Huxley, MD;  Location: [place] CV LAB;  Service: Cardiovascular;  Laterality: N/A;  . RADIOLOGY WITH ANESTHESIA N/A 05/06/2017    Procedure: IR WITH ANESTHESIA;  Surgeon: Radiologist, Medication, MD;  Location: [place];  Service: Radiology;  Laterality: N/A;  . TEE WITHOUT CARDIOVERSION N/A 05/10/2017    Procedure: TRANSESOPHAGEAL ECHOCARDIOGRAM (TEE);  Surgeon:  Sanda Klein, MD;  Location: [facility] ENDOSCOPY;  Service: Cardiovascular;  Laterality: N/A;  . TONSILLECTOMY        Social History:  reports that he quit smoking about 13 years ago. His smoking use included cigarettes. He has a 50.00 pack-year smoking history. he has never used smokeless tobacco. He reports that he does not drink alcohol or use drugs.   Family / Support Systems Marital Status: Married Patient Roles: Spouse, Parent, Other (Comment)(church deacon) Spouse/Significant Other: [name] [phone] Children: [name]-son  Other Supports: grandchildren are close and local Anticipated Caregiver: Wife Ability/Limitations of Caregiver: Wife has health issues and needs to be careful-heart issues and SOB at times Caregiver Availability: 24/7 Family Dynamics: Close knit family who are there for one another, they have a good church family who visit and send their prayers. Pt is one who is very independent and wants to take care of himself, he was even with chemo and rad treatments.   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    Abuse/Neglect Abuse/Neglect Assessment Can Be Completed: Yes Physical Abuse: Denies Verbal Abuse: Denies Sexual Abuse: Denies Exploitation of patient/patient's resources: Denies Self-Neglect: Denies   Emotional Status Pt's affect, behavior adn adjustment status: Pt is motivated to do well and will do anything the therapy team asks of him to make improvements. His wife is here and trying to help but needs to let the therapists do their evaluations. She is willing to help while here just like being at home Recent  Psychosocial Issues: other health issues had just finished his rad and chemo treatments for his lung cancer prior to coming into the hospital Pyschiatric History: No history deferred depression screen due to doing well and still adjusting to the new unit and program. He is coping appropriately and is talking about his feelings. Will monitor and have neruo-psych see while here if would be beneficial Substance Abuse History: No issues-quit tobacco years ago   Patient / Family Perceptions, Expectations & Goals Pt/Family understanding of illness & functional limitations: Pt and wife can explain his stroke and deficits. They both have spoken with the MD and feel thier questions and concerns have been addressed. Wife plans to be here daily and ask the MD her questions that come up. She is not one to shy away from  finding out the answers she seeks. 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, father, grandfather, retiree, church deacon, friend, etc Anticipated changes in roles/activities/participation: resume Pt/family expectations/goals: Pt states: " I want to be able to take care of myself before I leave here."  Wife states: " I hope he can get back to what he was doing before this, I need him too."   [employer]: Other (Comment)(Cancer [facility]) Premorbid Home Care/DME Agencies: None Transportation available at discharge: Wife Resource referrals recommended: Support group (specify)   Discharge Planning Living Arrangements: Spouse/significant other Support Systems: Spouse/significant other, Children, Other relatives, Friends/neighbors, Social worker community Type of Residence: Private residence Insurance Resources: [employer] Financial Resources: Radio broadcast assistant Screen Referred: No Living Expenses: Education officer, community Management: Patient, Spouse Does the patient have any problems obtaining your medications?: No Home Management: Wife Patient/Family Preliminary  Plans: Return home with wife who is willing to assist and maybe do too much for him at the expense of her own health issues. Glad she will be here daily and see him in therpaies so she will see what he can do for himself and what he will need assist with. Will await therapy team evaluations and work on discharge needs. Social Work Anticipated Follow Up Needs: Support Group, HH/OP   Clinical Impression Pleasant gentleman who is willing to push himself in therapies to achieve his goals of mod/i-supervision level. His wife is a strong support and tries to do too much for him, but feels she needs to do something while here. Will await therapy evaluations and work on a safe plan for him.WIll monitor to see if neuro-psych needed.    Elease Hashimoto 05/12/2017, 2:55 PM               Sabriah Hobbins, [name], LCSW  Social Worker  Physical Medicine and Rehabilitation  Progress Notes  Signed  Date of Service:  05/12/2017  2:55 PM       Related encounter: Admission (Discharged) from 05/11/2017 in [place]          _0 Hide copied text  _1 Hover for details   Social Work  Social Work Assessment and Plan   Patient Details  Name: Dustan Hyams MRN: [id number] Date of Birth: 11-06-1940   Today's Date: 05/12/2017   Problem List:      Patient Active Problem List    Diagnosis Date Noted  . CKD (chronic kidney disease) 05/11/2017  . Infarction of left basal ganglia ([place]) 05/11/2017  . Idiopathic hypotension 05/09/2017  . Stroke ([place]) 05/06/2017  . Stroke (cerebrum) ([place]) 05/06/2017  . Acute ischemic left MCA stroke ([place]) 05/06/2017  . Cancer of upper lobe of left lung ([place]) 10/30/2016  . Pure hypercholesterolemia 06/22/2014  . GERD without esophagitis 06/22/2014  . Benign essential hypertension 06/22/2014  . Diabetes mellitus without complication ([place]) 71/08/2692    Past Medical History:      Past Medical History:  Diagnosis Date  .  Cancer ([place])    . COPD (chronic obstructive pulmonary disease) ([place])    . Diabetes mellitus without complication ([place])    . GERD without esophagitis    . Hepatitis C virus      history of hep C treated with Harvoni  . Hypercholesterolemia    . Hypertension    . Seasonal allergies      Past Surgical History:       Past Surgical History:  Procedure Laterality Date  . COLONOSCOPY  approximately 11 years ago  . FLEXIBLE BRONCHOSCOPY N/A 11/05/2016    Procedure: FLEXIBLE BRONCHOSCOPY;  Surgeon: Wilhelmina Mcardle, MD;  Location: [facility] ORS;  Service: Pulmonary;  Laterality: N/A;  . IR FLUORO GUIDE CV LINE RIGHT   05/06/2017  . IR INTRAVSC STENT CERV CAROTID W/O EMB-PROT MOD SED INC ANGIO   05/06/2017  . IR PERCUTANEOUS ART THROMBECTOMY/INFUSION INTRACRANIAL INC DIAG ANGIO   05/06/2017  . IR US GUIDE VASC ACCESS LEFT   05/06/2017  . IR US GUIDE VASC ACCESS RIGHT   05/06/2017  . IR US GUIDE VASC ACCESS RIGHT   05/06/2017  . PORTA CATH INSERTION N/A 11/23/2016    Procedure: Glori Luis Cath Insertion;  Surgeon: Algernon Huxley, MD;  Location: [place] CV LAB;  Service: Cardiovascular;  Laterality: N/A;  . RADIOLOGY WITH ANESTHESIA N/A 05/06/2017    Procedure: IR WITH ANESTHESIA;  Surgeon: Radiologist, Medication, MD;  Location: [place];  Service: Radiology;  Laterality: N/A;  . TEE WITHOUT CARDIOVERSION N/A 05/10/2017    Procedure: TRANSESOPHAGEAL ECHOCARDIOGRAM (TEE);  Surgeon: Sanda Klein, MD;  [facility] ENDOSCOPY;  Service: Cardiovascular;  Laterality: N/A;  . TONSILLECTOMY        Social History:  reports that he quit smoking about 13 years ago. His smoking use included cigarettes. He has a 50.00 pack-year smoking history. he has never used smokeless tobacco. He reports that he does not drink alcohol or use drugs.   Family / Support Systems Marital Status: Married Patient Roles: Spouse, Parent, Other (Comment)(church deacon) Spouse/Significant Other: [name] [phone]  [phone] Children: [name]-son  Other Supports: grandchildren are close and local Anticipated Caregiver: Wife Ability/Limitations of Caregiver: Wife has health issues and needs to be careful-heart issues and SOB at times Caregiver Availability: 24/7 Family Dynamics: Close knit family who are there for one another, they have a good church family who visit and send their prayers. Pt is one who is very independent and wants to take care of himself, he was even with chemo and rad treatments.   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    Abuse/Neglect Abuse/Neglect Assessment Can Be Completed: Yes Physical Abuse: Denies Verbal Abuse: Denies Sexual Abuse: Denies Exploitation of patient/patient's resources: Denies Self-Neglect: Denies   Emotional Status Pt's affect, behavior adn adjustment status: Pt is motivated to do well and will do anything the therapy team asks of him to make improvements. His wife is here and trying to help but needs to let the therapists do their evaluations. She is willing to help while here just like being at home Recent Psychosocial Issues: other health issues had just finished his rad and chemo treatments for his lung cancer prior to coming into the hospital Pyschiatric History: No history deferred depression screen due to doing well and still adjusting to the new unit and program. He is coping appropriately and is talking about his feelings. Will monitor and have neruo-psych see while here if would be beneficial Substance Abuse History: No issues-quit tobacco years ago   Patient / Family Perceptions, Expectations & Goals Pt/Family  understanding of illness & functional limitations: Pt and wife can explain his stroke and deficits. They both have spoken with the MD and feel thier questions and concerns have been  addressed. Wife plans to be here daily and ask the MD her questions that come up. She is not one to shy away from finding out the answers she seeks. 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, father, grandfather, retiree, church deacon, friend, etc Anticipated changes in roles/activities/participation: resume Pt/family expectations/goals: Pt states: " I want to be able to take care of myself before I leave here."  Wife states: " I hope he can get back to what he was doing before this, I need him too."   [employer]: Other (Comment)(Cancer [facility]) Premorbid Home Care/DME Agencies: None Transportation available at discharge: Wife Resource referrals recommended: Support group (specify)   Discharge Planning Living Arrangements: Spouse/significant other Support Systems: Spouse/significant other, Children, Other relatives, Friends/neighbors, Social worker community Type of Residence: Private residence Insurance Resources: [employer] Financial Resources: Radio broadcast assistant Screen Referred: No Living Expenses: Education officer, community Management: Patient, Spouse Does the patient have any problems obtaining your medications?: No Home Management: Wife Patient/Family Preliminary Plans: Return home with wife who is willing to assist and maybe do too much for him at the expense of her own health issues. Glad she will be here daily and see him in therpaies so she will see what he can do for himself and what he will need assist with. Will await therapy team evaluations and work on discharge needs. Social Work Anticipated Follow Up Needs: Support Group, HH/OP   Clinical Impression Pleasant gentleman who is willing to push himself in therapies to achieve his goals of mod/i-supervision level. His wife is  a strong support and tries to do too much for him, but feels she needs to do something while here. Will await therapy evaluations and work on a safe plan for him.WIll monitor to see if neuro-psych needed.    Elease Hashimoto 05/12/2017, 2:55 PM              Patient ID: Arlander Gillen, male   DOB: 1940/09/03, 77 y.o.   MRN: [id number]

## 2017-06-09 NOTE — Progress Notes (Signed)
Speech Language Pathology Daily Session Note  Patient Details  Name: Edwin Jones MRN: [id number] Date of Birth: [birth year]  Today's Date: 06/09/2017 SLP Individual Time: 1130-1158 SLP Individual Time Calculation (min): 28 min  Short Term Goals: Week 3: SLP Short Term Goal 1 (Week 3): Pt will sustain attention to task for ~ 30 minutes with Min A verbal cues.  SLP Short Term Goal 2 (Week 3): Pt will decode at the sentence level with 80% accuracy with Mod A verbal and visual cues.  SLP Short Term Goal 3 (Week 3): Pt will demonstrate word-finding in 7 out of 10 opportunities with Max A verbal cues.  SLP Short Term Goal 4 (Week 3): Pt will follow 1-step simple directions in 8 out of 10 opportunities with Supervision A verbal cues.  SLP Short Term Goal 5 (Week 3): Pt will answer basic yes/no question in 8 out of 10 opportunities with Min A cues.  SLP Short Term Goal 6 (Week 3): Pt will consume trials of dysphagia 3 textures with efficient mastication and minimal oral residue with Min A verbal cues for use of compensatory swallowing strategies over 2 sessions prior to upgrade.   Skilled Therapeutic Interventions: Skilled treatment session focused on communication goals. SLP facilitated session by providing Mod A verbal and visual cues for pt to decode at the sentence level with 100% accuracy. Pt required Mod-Max A verbal cues for word-finding in 90% opportunities with wh-questions. Throughout session, pt able to answer basic yes/no questions in 100 opportunities with Supervision verbal cues. Pt also demonstrated increased spontaneous verbalization at the phrase level this session. Pt left upright in wheelchair with chair alarm on, quick release belt in place, and all needs within reach. Continue with current plan of care.      Function:  Cognition Comprehension Comprehension assist level: Understands basic 50 - 74% of the time/ requires cueing 25 - 49% of the time  Expression   Expression assist  level: Expresses basis less than 25% of the time/requires cueing >75% of the time.  Social Interaction Social Interaction assist level: Interacts appropriately 75 - 89% of the time - Needs redirection for appropriate language or to initiate interaction.  Problem Solving Problem solving assist level: Solves basic 50 - 74% of the time/requires cueing 25 - 49% of the time  Memory Memory assist level: Recognizes or recalls 25 - 49% of the time/requires cueing 50 - 75% of the time    Pain Pain Assessment Pain Scale: 0-10 Pain Score: 0-No pain  Therapy/Group: Individual Therapy  Meredeth Ide  SLP - Student 06/09/2017, 12:02 PM

## 2017-06-09 NOTE — Progress Notes (Signed)
Occupational Therapy Session Note  Patient Details  Name: Edwin Jones MRN: [id number] Date of Birth: Apr 24, 1940  Today's Date: 06/09/2017 OT Individual Time: 1415-1530 OT Individual Time Calculation (min): 75 min   Short Term Goals: Week 3:  OT Short Term Goal 1 (Week 3): STG=LTG 2/2 ELOS  Skilled Therapeutic Interventions/Progress Updates:    Pt greeted sitting in wc and agreeable to OT. When asked if pt needed to go to the bathroom, pt stated "yes." RN entered room to administer shot, then orthotics tech entered room to take pt's shoe for shoe cap. Pt brought to bathroom in wc and completed stand-pivot to commode with min A and use of grab bars. Min A for forced use of R hand to help push down pants. Pt needed cues to make sure his genitals were facing down in commode prior to urinating. Successful continent void. Standing balance with min guard A while reaching to pull up pants. Min A stand-pivot back to wc. Dynamic standing balance and endurance at the sink with standing had washing tasks with min guard A. Trunk elongation with dynamic reaching task using NDT techniques. Had pt reach with R UE to collect horse shoe, then cross midline to pass to L hand prior to placing on raised basketball rim. Graded clothes pin activity completed by removing clothes pins off bottom of shirt with R hand. Pt able to grip level 1 red clips and level 2 green clips. Pt returned to room at end of session and left seated in wc with safety belt, and chair alarm on.   Therapy Documentation Precautions:  Precautions Precautions: Fall Precaution Comments: apraxic, R hemi, R inattention Restrictions Weight Bearing Restrictions: No Pain: Pain Assessment Pain Score: 0-No pain Faces Pain Scale: No hurt ADL: ADL ADL Comments: Please see functional navigator  See Function Navigator for Current Functional Status.   Therapy/Group: Individual Therapy  Valma Cava 06/09/2017, 2:58 PM

## 2017-06-10 ENCOUNTER — Inpatient Hospital Stay (HOSPITAL_COMMUNITY): Payer: Medicare Other | Admitting: Speech Pathology

## 2017-06-10 ENCOUNTER — Inpatient Hospital Stay (HOSPITAL_COMMUNITY): Payer: Medicare Other | Admitting: Physical Therapy

## 2017-06-10 ENCOUNTER — Inpatient Hospital Stay (HOSPITAL_COMMUNITY): Payer: Medicare Other | Admitting: Occupational Therapy

## 2017-06-10 LAB — GLUCOSE, CAPILLARY
GLUCOSE-CAPILLARY: 123 mg/dL — AB (ref 65–99)
GLUCOSE-CAPILLARY: 168 mg/dL — AB (ref 65–99)
GLUCOSE-CAPILLARY: 218 mg/dL — AB (ref 65–99)
Glucose-Capillary: 121 mg/dL — ABNORMAL HIGH (ref 65–99)
Glucose-Capillary: 199 mg/dL — ABNORMAL HIGH (ref 65–99)

## 2017-06-10 NOTE — Progress Notes (Signed)
Occupational Therapy Session Note  Patient Details  Name: Edwin Jones MRN: [id number] Date of Birth: 07/03/1940  Today's Date: 06/10/2017 OT Individual Time: 1300-1400 OT Individual Time Calculation (min): 60 min   Short Term Goals: Week 3:  OT Short Term Goal 1 (Week 3): STG=LTG 2/2 ELOS  Skilled Therapeutic Interventions/Progress Updates:    Pt greeted sitting in wc and agreeable to OT treatment session.  When asked if pt needed to go to the bathroom he stated " Yes I need to go." But then stated "I already went." Meaning pt incontinent in brief. Re-iterated importance of calling for nursing to assist to commode. Worked on dynamic standing balance at the sink while washing peri-area and buttocks. Pt tolerated 3 mins standing with 50% supervision and intermittent min guard A + verbal cues to correct lateral lean to the R. Improved initiation to use R UE to assist with washing task. Pt brought outside in wc and worked on R fine motor control using yellow thera-putty and squeeze foam. Focus on functional pinch, in-hand manipulation, and strengthening. Pt returned to room and worked on word finding and visual scanning by playing "I Spy" on the way back to the room. Pt left seated in wc with safety belt on and needs met.   Therapy Documentation Precautions:  Precautions Precautions: Fall Precaution Comments: apraxic, R hemi, R inattention Restrictions Weight Bearing Restrictions: No Pain: Pain Assessment Pain Score: 0-No pain ADL: ADL ADL Comments: Please see functional navigator See Function Navigator for Current Functional Status.   Therapy/Group: Individual Therapy  Valma Cava 06/10/2017, 1:22 PM

## 2017-06-10 NOTE — Plan of Care (Signed)
  Problem: RH BOWEL ELIMINATION Goal: RH STG MANAGE BOWEL WITH ASSISTANCE Description STG Manage Bowel with mod  Assistance.  Outcome: Progressing   Problem: RH BLADDER ELIMINATION Goal: RH STG MANAGE BLADDER WITH ASSISTANCE Description STG Manage Bladder With mod Assistance  Outcome: Progressing   Problem: RH SKIN INTEGRITY Goal: RH STG SKIN FREE OF INFECTION/BREAKDOWN Description Skin free of infection/breakdown with min assistance  Outcome: Progressing   Problem: RH SAFETY Goal: RH STG ADHERE TO SAFETY PRECAUTIONS W/ASSISTANCE/DEVICE Description STG Adhere to Safety Precautions With min  Assistance/Device.  Outcome: Progressing

## 2017-06-10 NOTE — Progress Notes (Signed)
Speech Language Pathology Daily Session Note  Patient Details  Name: Kamuela Magos MRN: [id number] Date of Birth: Oct 21, 1940  Today's Date: 06/10/2017 SLP Individual Time: 1405-1430 SLP Individual Time Calculation (min): 25 min  Short Term Goals: Week 3: SLP Short Term Goal 1 (Week 3): Pt will sustain attention to task for ~ 30 minutes with Min A verbal cues.  SLP Short Term Goal 2 (Week 3): Pt will decode at the sentence level with 80% accuracy with Mod A verbal and visual cues.  SLP Short Term Goal 3 (Week 3): Pt will demonstrate word-finding in 7 out of 10 opportunities with Max A verbal cues.  SLP Short Term Goal 4 (Week 3): Pt will follow 1-step simple directions in 8 out of 10 opportunities with Supervision A verbal cues.  SLP Short Term Goal 5 (Week 3): Pt will answer basic yes/no question in 8 out of 10 opportunities with Min A cues.  SLP Short Term Goal 6 (Week 3): Pt will consume trials of dysphagia 3 textures with efficient mastication and minimal oral residue with Min A verbal cues for use of compensatory swallowing strategies over 2 sessions prior to upgrade.   Skilled Therapeutic Interventions:   Pt was seen for skilled ST targeting dysphagia goals.  SLP facilitated the session with trials of dys 3 textures to continue working towards diet progression.  Pt consumed trials with min assist to clear residuals from the oral cavity via liquid wash or manual removal.  Pt was left in wheelchair with call bell within reach.  Continue per current plan of care.      Function:  Eating Eating   Modified Consistency Diet: Yes Eating Assist Level: Supervision or verbal cues           Cognition Comprehension Comprehension assist level: Understands basic 50 - 74% of the time/ requires cueing 25 - 49% of the time  Expression   Expression assist level: Expresses basic 25 - 49% of the time/requires cueing 50 - 75% of the time. Uses single words/gestures.  Social Interaction Social  Interaction assist level: Interacts appropriately 90% of the time - Needs monitoring or encouragement for participation or interaction.  Problem Solving Problem solving assist level: Solves basic 50 - 74% of the time/requires cueing 25 - 49% of the time  Memory Memory assist level: Recognizes or recalls less than 25% of the time/requires cueing greater than 75% of the time    Pain Pain Assessment Pain Scale: 0-10 Pain Score: 0-No pain  Therapy/Group: Individual Therapy  Jaymz Traywick, Selinda Orion 06/10/2017, 4:31 PM

## 2017-06-10 NOTE — Progress Notes (Signed)
Subjective/Complaints: Remains aphasic   ROS: ? Y/N accuracy   Objective: Vital Signs: Blood pressure 127/71, pulse 84, temperature 98.6 F (37 C), temperature source Oral, resp. rate 18, height 6\' 3"  (1.905 m), weight 90.3 kg (199 lb 1.2 oz), SpO2 100 %. No results found. Results for orders placed or performed during the hospital encounter of 05/20/17 (from the past 72 hour(s))  Glucose, capillary     Status: Abnormal   Collection Time: 06/07/17 12:02 PM  Result Value Ref Range   Glucose-Capillary 179 (H) 65 - 99 mg/dL  Glucose, capillary     Status: Abnormal   Collection Time: 06/07/17  4:44 PM  Result Value Ref Range   Glucose-Capillary 285 (H) 65 - 99 mg/dL  Glucose, capillary     Status: Abnormal   Collection Time: 06/07/17  8:44 PM  Result Value Ref Range   Glucose-Capillary 267 (H) 65 - 99 mg/dL  Glucose, capillary     Status: Abnormal   Collection Time: 06/08/17  6:29 AM  Result Value Ref Range   Glucose-Capillary 225 (H) 65 - 99 mg/dL  Glucose, capillary     Status: Abnormal   Collection Time: 06/08/17 12:02 PM  Result Value Ref Range   Glucose-Capillary 225 (H) 65 - 99 mg/dL  Glucose, capillary     Status: Abnormal   Collection Time: 06/08/17  4:50 PM  Result Value Ref Range   Glucose-Capillary 178 (H) 65 - 99 mg/dL  Glucose, capillary     Status: Abnormal   Collection Time: 06/08/17  9:22 PM  Result Value Ref Range   Glucose-Capillary 257 (H) 65 - 99 mg/dL  Glucose, capillary     Status: Abnormal   Collection Time: 06/09/17  6:35 AM  Result Value Ref Range   Glucose-Capillary 152 (H) 65 - 99 mg/dL  Glucose, capillary     Status: Abnormal   Collection Time: 06/09/17 11:30 AM  Result Value Ref Range   Glucose-Capillary 199 (H) 65 - 99 mg/dL  Glucose, capillary     Status: Abnormal   Collection Time: 06/09/17  4:13 PM  Result Value Ref Range   Glucose-Capillary 219 (H) 65 - 99 mg/dL  Glucose, capillary     Status: Abnormal   Collection Time: 06/09/17  8:34  PM  Result Value Ref Range   Glucose-Capillary 237 (H) 65 - 99 mg/dL  Glucose, capillary     Status: Abnormal   Collection Time: 06/10/17  6:27 AM  Result Value Ref Range   Glucose-Capillary 121 (H) 65 - 99 mg/dL     HEENT: poor dentition Cardio:RRR without murmur. No JVD  Resp: CTA Bilaterally without wheezes or rales. Normal effort  GI: BS positive and non tender Extremity:  No Edema Skin:   Intact and Other portacath site intact Neuro: lethargic and aphasic.  Abnormal Motor 3/5 RUE, 4/5 RLE , 5/5 on Left side,  Right inattention. Right facial droop Musc/Skel:  Other no pain with UE or LE ROM Gen NAD   Assessment/Plan: 1. 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 Physiatrist is providing close team supervision and 24 hour management of active medical problems listed below. Physiatrist and rehab team continue to assess barriers to discharge/monitor patient progress toward functional and medical goals. FIM: Function - Bathing Position: Wheelchair/chair at sink Body parts bathed by patient: Right arm, Left arm, Chest, Abdomen, Front perineal area, Left lower leg, Right lower leg,  Left upper leg, Right upper leg Body parts bathed by helper: Buttocks, Back Assist Level: Touching or steadying assistance(Pt > 75%)  Function- Upper Body Dressing/Undressing What is the patient wearing?: Pull over shirt/dress Pull over shirt/dress - Perfomed by patient: Put head through opening, Thread/unthread left sleeve Pull over shirt/dress - Perfomed by helper: Thread/unthread right sleeve, Pull shirt over trunk Assist Level: Supervision or verbal cues Function - Lower Body Dressing/Undressing What is the patient wearing?: Pants, Shoes, AFO Position: Wheelchair/chair at sink Underwear - Performed by patient: Thread/unthread right underwear leg, Thread/unthread left  underwear leg Underwear - Performed by helper: Pull underwear up/down Pants- Performed by patient: Thread/unthread left pants leg, Thread/unthread right pants leg Pants- Performed by helper: Pull pants up/down Non-skid slipper socks- Performed by helper: Don/doff right sock, Don/doff left sock Socks - Performed by helper: Don/doff right sock, Don/doff left sock Shoes - Performed by patient: Don/doff left shoe Shoes - Performed by helper: Don/doff right shoe AFO - Performed by helper: Don/doff right AFO Assist for footwear: Partial/moderate assist Assist for lower body dressing: Touching or steadying assistance (Pt > 75%)  Function - Toileting Toileting activity did not occur: No continent bowel/bladder event Toileting steps completed by patient: Adjust clothing prior to toileting, Performs perineal hygiene, Adjust clothing after toileting Toileting steps completed by helper: Adjust clothing prior to toileting, Performs perineal hygiene, Adjust clothing after toileting Toileting Assistive Devices: Grab bar or rail Assist level: Supervision or verbal cues  Function - Air cabin crew transfer activity did not occur: N/A Toilet transfer assistive device: Grab bar, Elevated toilet seat/BSC over toilet Mechanical lift: Stedy Assist level to toilet: Touching or steadying assistance (Pt > 75%) Assist level from toilet: Touching or steadying assistance (Pt > 75%)  Function - Chair/bed transfer Chair/bed transfer method: Ambulatory Chair/bed transfer assist level: Touching or steadying assistance (Pt > 75%) Chair/bed transfer assistive device: Environmental manager lift: Stedy Chair/bed transfer details: Manual facilitation for weight shifting, Manual facilitation for weight bearing, Tactile cues for posture  Function - Locomotion: Wheelchair Will patient use wheelchair at discharge?: Yes Type: Manual Max wheelchair distance: 150' Assist Level: Supervision or verbal cues Assist Level:  Supervision or verbal cues Assist Level: Supervision or verbal cues Turns around,maneuvers to table,bed, and toilet,negotiates 3% grade,maneuvers on rugs and over doorsills: No Function - Locomotion: Ambulation Assistive device: Walker-rolling, Orthosis(R hand orthosis and toe up brace) Max distance: 52 ft Assist level: Touching or steadying assistance (Pt > 75%) Assist level: Touching or steadying assistance (Pt > 75%) Walk 50 feet with 2 turns activity did not occur: Safety/medical concerns Assist level: Touching or steadying assistance (Pt > 75%) Walk 150 feet activity did not occur: Safety/medical concerns Walk 10 feet on uneven surfaces activity did not occur: Safety/medical concerns  Function - Comprehension Comprehension: Auditory Comprehension assist level: Understands basic 75 - 89% of the time/ requires cueing 10 - 24% of the time  Function - Expression Expression: Verbal Expression assist level: Expresses basic 25 - 49% of the time/requires cueing 50 - 75% of the time. Uses single words/gestures.(Big improvement this session in verbal expression!)  Function - Social Interaction Social Interaction assist level: Interacts appropriately 75 - 89% of the time - Needs redirection for appropriate language or to initiate interaction.  Function - Problem Solving Problem solving assist level: Solves basic 25 - 49% of the time - needs direction more than half the time to initiate, plan or complete simple activities  Function - Memory Memory assist level: Recognizes or recalls less  than 25% of the time/requires cueing greater than 75% of the time Patient normally able to recall (first 3 days only): That he or she is in a hospital, Staff names and faces  Medical Problem List and Plan: 1.   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  2. DVT Prophylaxis/Anticoagulation: Pharmaceutical:Heparin--continue 3. Pain Management:tylenol  prn 4. Mood:LCSW to follow for evaluation when appropriate. 5. Neuropsych: This patientis notcapable of making decisions on hisown behalf. 6. Skin/Wound Care:routine pressure relief measures. 7. Fluids/Electrolytes/Nutrition:Monitor I/O. Continue to offer supplements between meals. 8.Hypertension:Hypotensionhas resolved and patient offMidodrine and Florinef Vitals:   06/09/17 1434 06/10/17 0108  BP: (!) 154/70 127/71  Pulse: 79 84  Resp: 18 18  Temp: 98.1 F (36.7 C) 98.6 F (37 C)  SpO2: 100% 100%  Controlled 4/4- monitor-Await orthostatic vitals 9. Acute on chronic BEE:FEOFHQRF SCr- 1.8-1.9. at baseline BMP Latest Ref Rng & Units 06/04/2017 05/28/2017 05/21/2017  Glucose 65 - 99 mg/dL 188(H) 205(H) 198(H)  BUN 6 - 20 mg/dL 39(H) 38(H) 20  Creatinine 0.61 - 1.24 mg/dL 1.94(H) 1.90(H) 1.75(H)  Sodium 135 - 145 mmol/L 132(L) 132(L) 134(L)  Potassium 3.5 - 5.1 mmol/L 5.0 4.8 4.5  Chloride 101 - 111 mmol/L 100(L) 98(L) 102  CO2 22 - 32 mmol/L 20(L) 21(L) 21(L)  Calcium 8.9 - 10.3 mg/dL 9.6 9.2 8.9   10. ABLA: Monitor H/H for signs of bleedinghas been in 10- 11 range. 11 on 3/15  12. Lethargy: CVA related-improving    Trial Ritalin with some improvement in arousal noted, no tachycard will titrate up, discuss with team  13. Oral thrush:  nystatin mouthwash as well as oral care every 4 hours while awake. 14. COPD/Left lung cancer: On Breo with albuterol prn.  15.DM: poorly controlled     -SSI   -added low dose amaryl on 3/16, increase to 4mg  on 3/22-monitor, not a good candidate for Metformin due to CKD CBG (last 3)  Recent Labs    06/09/17 1613 06/09/17 2034 06/10/17 0627  GLUCAP 219* 237* 121*   Increase lantus,25U 4/1 , increase to 30U 4/2  Improved this am  LOS (Days) 21 A FACE TO FACE EVALUATION WAS PERFORMED  Charlett Blake 06/10/2017, 7:46 AM

## 2017-06-10 NOTE — Progress Notes (Addendum)
Speech Language Pathology Daily Session Note  Patient Details  Name: Edwin Jones MRN: [id number] Date of Birth: 09-09-1940  Today's Date: 06/10/2017 SLP Individual Time: 1000-1030 SLP Individual Time Calculation (min): 30 min  Short Term Goals: Week 3: SLP Short Term Goal 1 (Week 3): Pt will sustain attention to task for ~ 30 minutes with Min A verbal cues.  SLP Short Term Goal 2 (Week 3): Pt will decode at the sentence level with 80% accuracy with Mod A verbal and visual cues.  SLP Short Term Goal 3 (Week 3): Pt will demonstrate word-finding in 7 out of 10 opportunities with Max A verbal cues.  SLP Short Term Goal 4 (Week 3): Pt will follow 1-step simple directions in 8 out of 10 opportunities with Supervision A verbal cues.  SLP Short Term Goal 5 (Week 3): Pt will answer basic yes/no question in 8 out of 10 opportunities with Min A cues.  SLP Short Term Goal 6 (Week 3): Pt will consume trials of dysphagia 3 textures with efficient mastication and minimal oral residue with Min A verbal cues for use of compensatory swallowing strategies over 2 sessions prior to upgrade.   Skilled Therapeutic Interventions: Skilled treatment session focused on communication goals. SLP facilitated session by providing Mod A verbal and visual cues for pt to decode at the sentence level with 100% accuracy. Pt required Mod-Max A verbal cues for word-finding in 90% opportunities with wh-questions. Throughout session, pt able to answer basic yes/no questions in 100% opportunities with Mod I and demonstrated increased spontaneous verbalization at the sentence level. Pt also demonstrated increased vocal intensity with Min A verbal cues to achieve 80% speech intelligibility at the sentence level. Pt left supine in bed with alarm on and all needs within reach. Continue with current plan of care.      Function:  Cognition Comprehension Comprehension assist level: Understands basic 50 - 74% of the time/ requires cueing 25  - 49% of the time  Expression   Expression assist level: Expresses basic 25 - 49% of the time/requires cueing 50 - 75% of the time. Uses single words/gestures.  Social Interaction Social Interaction assist level: Interacts appropriately 90% of the time - Needs monitoring or encouragement for participation or interaction.  Problem Solving Problem solving assist level: Solves basic 50 - 74% of the time/requires cueing 25 - 49% of the time  Memory Memory assist level: Recognizes or recalls less than 25% of the time/requires cueing greater than 75% of the time    Pain Pain Assessment Pain Score: 0-No pain  Therapy/Group: Individual Therapy  Meredeth Ide  SLP - Student 06/10/2017, 12:21 PM

## 2017-06-10 NOTE — Progress Notes (Signed)
Physical Therapy Session Note  Patient Details  Name: Edwin Jones MRN: [id number] Date of Birth: Jul 30, 1940  Today's Date: 06/10/2017 PT Individual Time: 1045-1150 PT Individual Time Calculation (min): 65 min   Short Term Goals: Week 3:  PT Short Term Goal 1 (Week 3): Pt will negotiate 1 curb w/ RW w/ mod assist  PT Short Term Goal 2 (Week 3): Pt will perform bed mobility w/o use of hospital bed functions w/ min assist PT Short Term Goal 3 (Week 3): Pt will ambulate 25' w/ LRAD w/ min assist  Skilled Therapeutic Interventions/Progress Updates:   Pt in supine and agreeable to therapy, no c/o pain. Session focused on functional mobility in home-like set-up and blocked practice of functional tasks. Transferred to EOB w/ supervision and provided min assist for dressing including threading pants and RUE into shirt. Total assist to don shoes for time management. Performed multiple stand pivot transfers in both directions including to/from bed and couch in ADL apartment. Min-mod assist for 1st stand pivot to R side, otherwise min guard to close supervision for remainder of session. Occasional verbal cues for step placement during step-pivot. Pt required min assist to boost into standing from couch, otherwise supervision rest of the time w/ frequent verbal cues to scoot forward and perform anterior weight shifting before boosting into stance. Performed sit<>supine w/o use of hospital bed functions w/ supervision and verbal cues, pt performed on side of bed he typically sleeps on when at home. Educated pt on taking rest breaks in between transfers for energy conservation as his sit<>stands require more assistance when fatigued. Switched w/c to 18x18 so w/c can fit through pt's bedroom doorway, still room between armrest and pt's hips. Ended session in w/c, call bell within reach and all needs met. Quick release belt donned.   Therapy Documentation Precautions:  Precautions Precautions: Fall Precaution  Comments: apraxic, R hemi, R inattention Restrictions Weight Bearing Restrictions: No Pain: Pain Assessment Pain Score: 0-No pain  See Function Navigator for Current Functional Status.   Therapy/Group: Individual Therapy  Fredrik Mogel K Arnette 06/10/2017, 12:39 PM

## 2017-06-11 ENCOUNTER — Encounter (HOSPITAL_COMMUNITY): Payer: Medicare Other | Admitting: Occupational Therapy

## 2017-06-11 ENCOUNTER — Inpatient Hospital Stay (HOSPITAL_COMMUNITY): Payer: Medicare Other | Admitting: Speech Pathology

## 2017-06-11 ENCOUNTER — Ambulatory Visit (HOSPITAL_COMMUNITY): Payer: Medicare Other | Admitting: Physical Therapy

## 2017-06-11 LAB — BASIC METABOLIC PANEL
Anion gap: 11 (ref 5–15)
BUN: 46 mg/dL — AB (ref 6–20)
CO2: 25 mmol/L (ref 22–32)
CREATININE: 2.06 mg/dL — AB (ref 0.61–1.24)
Calcium: 9.7 mg/dL (ref 8.9–10.3)
Chloride: 99 mmol/L — ABNORMAL LOW (ref 101–111)
GFR, EST AFRICAN AMERICAN: 34 mL/min — AB (ref >60)
GFR, EST NON AFRICAN AMERICAN: 29 mL/min — AB (ref >60)
Glucose, Bld: 159 mg/dL — ABNORMAL HIGH (ref 65–99)
Potassium: 4.9 mmol/L (ref 3.5–5.1)
SODIUM: 135 mmol/L (ref 135–145)

## 2017-06-11 LAB — GLUCOSE, CAPILLARY
GLUCOSE-CAPILLARY: 163 mg/dL — AB (ref 65–99)
Glucose-Capillary: 155 mg/dL — ABNORMAL HIGH (ref 65–99)
Glucose-Capillary: 170 mg/dL — ABNORMAL HIGH (ref 65–99)
Glucose-Capillary: 250 mg/dL — ABNORMAL HIGH (ref 65–99)

## 2017-06-11 MED ORDER — INSULIN GLARGINE 100 UNIT/ML ~~LOC~~ SOLN
35.0000 [IU] | Freq: Every day | SUBCUTANEOUS | Status: DC
  Administered 2017-06-11 – 2017-06-13 (×3): 35 [IU] via SUBCUTANEOUS
  Filled 2017-06-11 (×3): qty 0.35

## 2017-06-11 NOTE — Progress Notes (Signed)
Physical Therapy Weekly Progress Note  Patient Details  Name: Edwin Jones MRN: [id number] Date of Birth: Jul 23, 1940  Beginning of progress report period: June 04, 2017 End of progress report period: June 11, 2017  Today's Date: 06/11/2017 PT Individual Time: 1000-1100 PT Individual Time Calculation (min): 60 min   Patient has met 3 of 3 short term goals. Pt is making steady gains in therapy and performing most mobility at the min assist level. Stand pivot transfers and gait w/ RW require occasional verbal and manual cues for technique, sequencing, and standing balance. He has practiced transfers and bed mobility in ADL apartment to simulate home set-up and his wife has been educated on assist required for safe furniture and w/c transfers, bed mobility, and negotiating steps into house.   Patient continues to demonstrate the following deficits muscle weakness, decreased cardiorespiratoy endurance, impaired timing and sequencing, unbalanced muscle activation, motor apraxia, decreased coordination and decreased motor planning, decreased attention to right, decreased initiation, decreased problem solving, decreased safety awareness and delayed processing and decreased standing balance, hemiplegia and decreased balance strategies and therefore will continue to benefit from skilled PT intervention to increase functional independence with mobility.  Patient progressing toward long term goals..  Continue plan of care.  PT Short Term Goals Week 3:  PT Short Term Goal 1 (Week 3): Pt will negotiate 1 curb w/ RW w/ mod assist  PT Short Term Goal 1 - Progress (Week 3): Met PT Short Term Goal 2 (Week 3): Pt will perform bed mobility w/o use of hospital bed functions w/ min assist PT Short Term Goal 2 - Progress (Week 3): Met PT Short Term Goal 3 (Week 3): Pt will ambulate 25' w/ LRAD w/ min assist PT Short Term Goal 3 - Progress (Week 3): Met Week 4:  PT Short Term Goal 1 (Week 4): =LTGs due to  ELOS  Skilled Therapeutic Interventions/Progress Updates:   Pt in w/c and agreeable to session, no c/o pain. Wife present for family education. Practiced multiple stand pivot furniture transfers in both directions to bedside in ADL apartment. Instructed and educated wife on occasional HHA pt requires for balance when transferring, otherwise he requires just close supervision and occasional verbal cues for safety. Wife returned demonstration safely during multiple transfers. Spent time discussing bedroom set up and educated on energy conservation techniques for increased safety, especially when getting up first thing in the morning. Discussed w/c mobility throughout the home as pt is able to self-propel w/ supervision using L hemi technique. Practiced negotiating 3" curb w/ RW as per home entrance set-up and taking 2-3 small steps to reach door frame. Educated wife and she returned demonstration of appropriate min guard assistance and manual assist w/ safe RW placement. Pt has 7" curb at home, and discussed w/ wife that he is able to negotiate 6" regular steps at stairwell, but we do not have a way to simulate 7" curb for RW placement. He will use the same technique w/ 7" step at home and we will continue to practice larger steps at stairwell so pt is used to powering up. Demonstrated w/c bumping technique should they needed it. Wife is able to have sons or grandsons come to assist w/ w/c bumping if need be. Did no educate on gait as pt contiues to require skilled assistance w/ gait, wife verbalized understanding and in agreement. Returned to room and ended session in w/c and in care of wife, all needs met.   Therapy Documentation Precautions:  Precautions  Precautions: Fall Precaution Comments: apraxic, R hemi, R inattention Restrictions Weight Bearing Restrictions: No  See Function Navigator for Current Functional Status.  Therapy/Group: Individual Therapy  Melda Mermelstein K Arnette 06/11/2017, 12:03 PM

## 2017-06-11 NOTE — Progress Notes (Signed)
Social Work Patient ID: Edwin Jones, male   DOB: 02-11-1941, 77 y.o.   MRN: [id number] Wife here to go through family education and learn pt's care. OT was scheduled before she arrived. She plans on taking pt home even though she knows it may be difficult. She has spoken with the family and they will try to help her. Son stays with them at night. She is aware of the NH option but wants to try it at home. She feels he will be happier there. Discussed equipment needs and follow up via home health. She is in agreement. She would like his prescriptions at least on Monday so she will be able to get them filled prior to his discharge on Tuesday. Wife to be here this weekend and Monday to see more therapies and do hands on. She feels she can manage him at home.

## 2017-06-11 NOTE — Progress Notes (Signed)
Occupational Therapy Session Note  Patient Details  Name: Edwin Jones MRN: [id number] Date of Birth: [birth year]  Today's Date: 06/11/2017 OT Individual Time: 0900-1000 OT Individual Time Calculation (min): 60 min    Short Term Goals: Week 3:  OT Short Term Goal 1 (Week 3): STG=LTG 2/2 ELOS  Skilled Therapeutic Interventions/Progress Updates:    Pt's spouse supposed to be present for family education during OT treatment session, however, she did not show. Pt came to sitting EOB with increased time and min A today to elevate trunk. Able to achieve sitting balance w/ supervision and verbal cues. Squat-pivot transfer to L side was mod A. Stand-pivot into shower with use of grab bars only min A and facilitation at hips/trunk to elicit pivot. Bathing completed with min A overall and min guard A for balance when standing to wash buttocks. Similar transfer out of shower. Incorporated word finding within dressing tasks and had pt name each clothing item prior to donning. Pt needed assistance to orient clothing and increased time, but was able to complete UB dressing with set-up A. LB dressing with mod A and multimodal cues for hemi-dressing techniques. Pt's spouse entered at the end of session and discussed with her that wc will not fit into bathroom and pt will have to sponge bathe and use BSC until he can ambulate safely into bathroom with assistance. Pt handoff to PT at end of session.  Therapy Documentation Precautions:  Precautions Precautions: Fall Precaution Comments: apraxic, R hemi, R inattention Restrictions Weight Bearing Restrictions: No Pain:  none ADL: ADL ADL Comments: Please see functional navigator  See Function Navigator for Current Functional Status.   Therapy/Group: Individual Therapy  Valma Cava 06/11/2017, 9:37 AM

## 2017-06-11 NOTE — Progress Notes (Signed)
Speech Language Pathology Daily Session Note  Patient Details  Name: Edwin Jones MRN: [id number] Date of Birth: [birth year]  Today's Date: 06/11/2017 SLP Individual Time: 1100-1130 SLP Individual Time Calculation (min): 30 min  Short Term Goals: Week 3: SLP Short Term Goal 1 (Week 3): Pt will sustain attention to task for ~ 30 minutes with Min A verbal cues.  SLP Short Term Goal 2 (Week 3): Pt will decode at the sentence level with 80% accuracy with Mod A verbal and visual cues.  SLP Short Term Goal 3 (Week 3): Pt will demonstrate word-finding in 7 out of 10 opportunities with Max A verbal cues.  SLP Short Term Goal 4 (Week 3): Pt will follow 1-step simple directions in 8 out of 10 opportunities with Supervision A verbal cues.  SLP Short Term Goal 5 (Week 3): Pt will answer basic yes/no question in 8 out of 10 opportunities with Min A cues.  SLP Short Term Goal 6 (Week 3): Pt will consume trials of dysphagia 3 textures with efficient mastication and minimal oral residue with Min A verbal cues for use of compensatory swallowing strategies over 2 sessions prior to upgrade.   Skilled Therapeutic Interventions: Skilled treatment session focused on patient and family education with the patient's wife. SLP facilitated session by providing education in regards to patient's current swallowing function, diet recommendations, appropriate textures, medication administration and compensatory strategies. SLP also facilitated session by providing education in regards to patient's receptive and expressive language deficits and strategies to utilize maximize auditory comprehension and verbal expression of basic information. Handouts were also given to reinfroce information. All of her questions were answered, however, information will need to be reinforced due to SLP suspecting she did not fully understand all information provided indicative of her types of questions. Patient left upright in wheelchair with wife  present. Continue with current plan of care.      Function:   Cognition Comprehension Comprehension assist level: Understands basic 50 - 74% of the time/ requires cueing 25 - 49% of the time  Expression   Expression assist level: Expresses basic 25 - 49% of the time/requires cueing 50 - 75% of the time. Uses single words/gestures.;Expresses basic 50 - 74% of the time/requires cueing 25 - 49% of the time. Needs to repeat parts of sentences.  Social Interaction Social Interaction assist level: Interacts appropriately 90% of the time - Needs monitoring or encouragement for participation or interaction.  Problem Solving Problem solving assist level: Solves basic 50 - 74% of the time/requires cueing 25 - 49% of the time  Memory Memory assist level: Recognizes or recalls 25 - 49% of the time/requires cueing 50 - 75% of the time    Pain No/Denies Pain   Therapy/Group: Individual Therapy  Aniruddh Ciavarella 06/11/2017, 2:08 PM

## 2017-06-11 NOTE — Progress Notes (Signed)
Speech Language Pathology Weekly Progress and Session Note  Patient Details  Name: Edwin Jones MRN: [id number] Date of Birth: [birth year]  Beginning of progress report period: June 04, 2017 End of progress report period: June 11, 2017  Today's Date: 06/11/2017 SLP Individual Time: 4098-1191 SLP Individual Time Calculation (min): 45 min  Short Term Goals: Week 3: SLP Short Term Goal 1 (Week 3): Pt will sustain attention to task for ~ 30 minutes with Min A verbal cues.  SLP Short Term Goal 1 - Progress (Week 3): Met SLP Short Term Goal 2 (Week 3): Pt will decode at the sentence level with 80% accuracy with Mod A verbal and visual cues.  SLP Short Term Goal 2 - Progress (Week 3): Met SLP Short Term Goal 3 (Week 3): Pt will demonstrate word-finding in 7 out of 10 opportunities with Max A verbal cues.  SLP Short Term Goal 3 - Progress (Week 3): Met SLP Short Term Goal 4 (Week 3): Pt will follow 1-step simple directions in 8 out of 10 opportunities with Supervision A verbal cues.  SLP Short Term Goal 4 - Progress (Week 3): Met SLP Short Term Goal 5 (Week 3): Pt will answer basic yes/no question in 8 out of 10 opportunities with Min A cues.  SLP Short Term Goal 5 - Progress (Week 3): Met SLP Short Term Goal 6 (Week 3): Pt will consume trials of dysphagia 3 textures with efficient mastication and minimal oral residue with Min A verbal cues for use of compensatory swallowing strategies over 2 sessions prior to upgrade.  SLP Short Term Goal 6 - Progress (Week 3): Not met    New Short Term Goals: Week 4: SLP Short Term Goal 1 (Week 4): Pt will complete basic problem solving tasks with Mod A verbal cues. SLP Short Term Goal 2 (Week 4): Pt will decode at the sentence level with 80% accuracy with Min A verbal and visual cues.  SLP Short Term Goal 3 (Week 4): Pt will demonstrate word-finding in 7 out of 10 opportunities with Mod A verbal cues.  SLP Short Term Goal 4 (Week 4): Pt will use speech  intelligibility strategies to increase speech intelligibility to 90% at the sentence level with Mod A verbal cues.  SLP Short Term Goal 5 (Week 4): Pt will consume trials of dysphagia 3 textures with efficient mastication and minimal oral residue with Min A verbal cues for use of compensatory swallowing strategies over 2 sessions prior to upgrade.  SLP Short Term Goal 6 (Week 4): Pt will express basic wants/needs at the phrase level with Mod A verbal cues.   Weekly Progress Updates: Pt continues to make functional gains and has met 5 out of 6 STGs this reporting period. Currently, pt requires overall Max verbal cues for word-finding at the sentence level and Mod A verbal cues for decoding at the sentence level with 90% accuracy. Pt able to follow 1-step directions and answer basic yes/no questions with 100% accuracy with Mod I. However, pt continues to require Mod A verbal cues to consume dys. 3 textures with use of compensatory swallowing strategies for efficient mastication and minimal oral residue. Therefore, recommend continue trials of dys. 3 textures with SLP. Patient and family education is ongoing. Patient would benefit from continued skilled SLP intervention to maximize his cognitive-linguistic and swallowing function in order to maximize his overall functional independence prior to discharge.     Intensity: Minumum of 1-2 x/day, 30 to 90 minutes Frequency: 3 to 5 out  of 7 days Duration/Length of Stay: 06/15/17 Treatment/Interventions: Cueing hierarchy;Functional tasks;Patient/family education;Therapeutic Activities;Speech/Language facilitation;Dysphagia/aspiration precaution training;Multimodal communication approach;Cognitive remediation/compensation;Internal/external aids   Daily Session  Skilled Therapeutic Interventions: Skilled treatment session focused on communication and cognitive goals. SLP facilitated session by providing Mod-Max A verbal cues for problem solving during a 3-step  picture sequencing task. Pt required Mod-Max A verbal and visual cues for word-finding at the sentence level during the verbal description task. Pt able to answer basic yes/no questions and follow 1-step directions with 100% accuracy with Mod I throughout session. Pt also demonstrated selective attention in a mildly distracting environment for ~30 minutes with Min A verbal cues for redirection. Pt left supine in bed with alarm on and all needs within reach. Continue with current plan of care.     Function:   Cognition Comprehension Comprehension assist level: Understands basic 90% of the time/cues < 10% of the time  Expression   Expression assist level: Expresses basic 25 - 49% of the time/requires cueing 50 - 75% of the time. Uses single words/gestures.  Social Interaction Social Interaction assist level: Interacts appropriately 90% of the time - Needs monitoring or encouragement for participation or interaction.  Problem Solving Problem solving assist level: Solves basic 50 - 74% of the time/requires cueing 25 - 49% of the time  Memory Memory assist level: Recognizes or recalls 25 - 49% of the time/requires cueing 50 - 75% of the time     Pain Pain Assessment Pain Score: 0-No pain  Therapy/Group: Individual Therapy  Meredeth Ide  SLP - Student 06/11/2017, 3:52 PM

## 2017-06-11 NOTE — Progress Notes (Signed)
Subjective/Complaints: No issues overnite   ROS: ? Y/N accuracy   Objective: Vital Signs: Blood pressure (!) 150/79, pulse 89, temperature 98.4 F (36.9 C), temperature source Oral, resp. rate 16, height '6\' 3"'$  (1.905 m), weight 90.3 kg (199 lb 1.2 oz), SpO2 100 %. No results found. Results for orders placed or performed during the hospital encounter of 05/20/17 (from the past 72 hour(s))  Glucose, capillary     Status: Abnormal   Collection Time: 06/08/17 12:02 PM  Result Value Ref Range   Glucose-Capillary 225 (H) 65 - 99 mg/dL  Glucose, capillary     Status: Abnormal   Collection Time: 06/08/17  4:50 PM  Result Value Ref Range   Glucose-Capillary 178 (H) 65 - 99 mg/dL  Glucose, capillary     Status: Abnormal   Collection Time: 06/08/17  9:22 PM  Result Value Ref Range   Glucose-Capillary 257 (H) 65 - 99 mg/dL  Glucose, capillary     Status: Abnormal   Collection Time: 06/09/17  6:35 AM  Result Value Ref Range   Glucose-Capillary 152 (H) 65 - 99 mg/dL  Glucose, capillary     Status: Abnormal   Collection Time: 06/09/17 11:30 AM  Result Value Ref Range   Glucose-Capillary 199 (H) 65 - 99 mg/dL  Glucose, capillary     Status: Abnormal   Collection Time: 06/09/17  4:13 PM  Result Value Ref Range   Glucose-Capillary 219 (H) 65 - 99 mg/dL  Glucose, capillary     Status: Abnormal   Collection Time: 06/09/17  8:34 PM  Result Value Ref Range   Glucose-Capillary 237 (H) 65 - 99 mg/dL  Glucose, capillary     Status: Abnormal   Collection Time: 06/10/17  6:27 AM  Result Value Ref Range   Glucose-Capillary 121 (H) 65 - 99 mg/dL  Glucose, capillary     Status: Abnormal   Collection Time: 06/10/17  8:00 AM  Result Value Ref Range   Glucose-Capillary 123 (H) 65 - 99 mg/dL  Glucose, capillary     Status: Abnormal   Collection Time: 06/10/17 11:59 AM  Result Value Ref Range   Glucose-Capillary 168 (H) 65 - 99 mg/dL  Glucose, capillary     Status: Abnormal   Collection Time:  06/10/17  4:35 PM  Result Value Ref Range   Glucose-Capillary 199 (H) 65 - 99 mg/dL  Glucose, capillary     Status: Abnormal   Collection Time: 06/10/17  9:12 PM  Result Value Ref Range   Glucose-Capillary 218 (H) 65 - 99 mg/dL  Glucose, capillary     Status: Abnormal   Collection Time: 06/11/17  5:45 AM  Result Value Ref Range   Glucose-Capillary 163 (H) 65 - 99 mg/dL  Basic metabolic panel     Status: Abnormal   Collection Time: 06/11/17  6:39 AM  Result Value Ref Range   Sodium 135 135 - 145 mmol/L   Potassium 4.9 3.5 - 5.1 mmol/L    Comment: SLIGHT HEMOLYSIS   Chloride 99 (L) 101 - 111 mmol/L   CO2 25 22 - 32 mmol/L   Glucose, Bld 159 (H) 65 - 99 mg/dL   BUN 46 (H) 6 - 20 mg/dL   Creatinine, Ser 2.06 (H) 0.61 - 1.24 mg/dL   Calcium 9.7 8.9 - 10.3 mg/dL   GFR calc non Af Amer 29 (L) >60 mL/min   GFR calc Af Amer 34 (L) >60 mL/min    Comment: (NOTE) The eGFR has been calculated using the  CKD EPI equation. This calculation has not been validated in all clinical situations. eGFR's persistently <60 mL/min signify possible Chronic Kidney Disease.    Anion gap 11 5 - 15    Comment: Performed at [facility] [address] [place], [place] [address]     HEENT: poor dentition Cardio:RRR without murmur. No JVD  Resp: CTA Bilaterally without wheezes or rales. Normal effort  GI: BS positive and non tender Extremity:  No Edema Skin:   Intact and Other portacath site intact Neuro: lethargic and aphasic.  Abnormal Motor 3/5 RUE, 4/5 RLE , 5/5 on Left side,  Right inattention. Right facial droop Musc/Skel:  Other no pain with UE or LE ROM Gen NAD   Assessment/Plan: 1. 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 Physiatrist is providing close team supervision and 24 hour management of active medical problems listed below. Physiatrist and rehab  team continue to assess barriers to discharge/monitor patient progress toward functional and medical goals. FIM: Function - Bathing Position: Wheelchair/chair at sink Body parts bathed by patient: Right arm, Left arm, Chest, Abdomen, Front perineal area, Left lower leg, Right lower leg, Left upper leg, Right upper leg Body parts bathed by helper: Buttocks, Back Assist Level: Touching or steadying assistance(Pt > 75%)  Function- Upper Body Dressing/Undressing What is the patient wearing?: Pull over shirt/dress Pull over shirt/dress - Perfomed by patient: Put head through opening, Thread/unthread left sleeve Pull over shirt/dress - Perfomed by helper: Thread/unthread right sleeve, Pull shirt over trunk Assist Level: Supervision or verbal cues Function - Lower Body Dressing/Undressing What is the patient wearing?: Pants, Shoes, AFO Position: Wheelchair/chair at sink Underwear - Performed by patient: Thread/unthread right underwear leg, Thread/unthread left underwear leg Underwear - Performed by helper: Pull underwear up/down Pants- Performed by patient: Thread/unthread left pants leg, Thread/unthread right pants leg Pants- Performed by helper: Pull pants up/down Non-skid slipper socks- Performed by helper: Don/doff right sock, Don/doff left sock Socks - Performed by helper: Don/doff right sock, Don/doff left sock Shoes - Performed by patient: Don/doff left shoe Shoes - Performed by helper: Don/doff right shoe AFO - Performed by helper: Don/doff right AFO Assist for footwear: Partial/moderate assist Assist for lower body dressing: Touching or steadying assistance (Pt > 75%)  Function - Toileting Toileting activity did not occur: No continent bowel/bladder event Toileting steps completed by patient: Adjust clothing prior to toileting, Performs perineal hygiene, Adjust clothing after toileting Toileting steps completed by helper: Adjust clothing prior to toileting, Performs perineal hygiene,  Adjust clothing after toileting Toileting Assistive Devices: Grab bar or rail Assist level: Supervision or verbal cues  Function - Air cabin crew transfer activity did not occur: N/A Toilet transfer assistive device: Grab bar, Elevated toilet seat/BSC over toilet Mechanical lift: Stedy Assist level to toilet: Touching or steadying assistance (Pt > 75%) Assist level from toilet: Touching or steadying assistance (Pt > 75%)  Function - Chair/bed transfer Chair/bed transfer method: Stand pivot Chair/bed transfer assist level: Touching or steadying assistance (Pt > 75%) Chair/bed transfer assistive device: Armrests Mechanical lift: Stedy Chair/bed transfer details: Verbal cues for technique, Verbal cues for sequencing, Verbal cues for precautions/safety  Function - Locomotion: Wheelchair Will patient use wheelchair at discharge?: Yes Type: Manual Max wheelchair distance: 150' Assist Level: Supervision or verbal cues Assist Level: Supervision or verbal cues Assist Level: Supervision or verbal cues Turns around,maneuvers to table,bed, and toilet,negotiates 3% grade,maneuvers  on rugs and over doorsills: No Function - Locomotion: Ambulation Assistive device: Walker-rolling, Orthosis(R hand orthosis and toe up brace) Max distance: 52 ft Assist level: Touching or steadying assistance (Pt > 75%) Assist level: Touching or steadying assistance (Pt > 75%) Walk 50 feet with 2 turns activity did not occur: Safety/medical concerns Assist level: Touching or steadying assistance (Pt > 75%) Walk 150 feet activity did not occur: Safety/medical concerns Walk 10 feet on uneven surfaces activity did not occur: Safety/medical concerns  Function - Comprehension Comprehension: Auditory Comprehension assist level: Understands basic 50 - 74% of the time/ requires cueing 25 - 49% of the time  Function - Expression Expression: Verbal Expression assist level: Expresses basic 25 - 49% of the  time/requires cueing 50 - 75% of the time. Uses single words/gestures.  Function - Social Interaction Social Interaction assist level: Interacts appropriately 90% of the time - Needs monitoring or encouragement for participation or interaction.  Function - Problem Solving Problem solving assist level: Solves basic 50 - 74% of the time/requires cueing 25 - 49% of the time  Function - Memory Memory assist level: Recognizes or recalls less than 25% of the time/requires cueing greater than 75% of the time Patient normally able to recall (first 3 days only): That he or she is in a hospital, Staff names and faces  Medical Problem List and Plan: 1.   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  2. DVT Prophylaxis/Anticoagulation: Pharmaceutical:Heparin--continue 3. Pain Management:tylenol prn 4. Mood:LCSW to follow for evaluation when appropriate. 5. Neuropsych: This patientis notcapable of making decisions on hisown behalf. 6. Skin/Wound Care:routine pressure relief measures. 7. Fluids/Electrolytes/Nutrition:Monitor I/O. Continue to offer supplements between meals. 8.Hypertension:Hypotensionhas resolved and patient offMidodrine and Florinef Vitals:   06/10/17 0800 06/10/17 1500  BP: (!) 152/86 (!) 150/79  Pulse: 80 89  Resp: 16 16  Temp:  98.4 F (36.9 C)  SpO2: 100% 100%  Controlled 4/4- monitor-Await orthostatic vitals 9. Acute on chronic UMP:NTIRWERX SCr- 1.8-1.9. slightly above baseline- enc po, 1271m yesterday BMP Latest Ref Rng & Units 06/11/2017 06/04/2017 05/28/2017  Glucose 65 - 99 mg/dL 159(H) 188(H) 205(H)  BUN 6 - 20 mg/dL 46(H) 39(H) 38(H)  Creatinine 0.61 - 1.24 mg/dL 2.06(H) 1.94(H) 1.90(H)  Sodium 135 - 145 mmol/L 135 132(L) 132(L)  Potassium 3.5 - 5.1 mmol/L 4.9 5.0 4.8  Chloride 101 - 111 mmol/L 99(L) 100(L) 98(L)  CO2 22 - 32 mmol/L 25 20(L) 21(L)  Calcium 8.9 - 10.3 mg/dL 9.7 9.6 9.2   10. ABLA:  Monitor H/H for signs of bleedinghas been in 10- 11 range. 11 on 3/15  12. Lethargy: CVA related-improving    Trial Ritalin with some improvement in arousal noted, no tachycard will titrate up, discuss with team  13. Oral thrush:  nystatin mouthwash as well as oral care every 4 hours while awake. 14. COPD/Left lung cancer: On Breo with albuterol prn.  15.DM: poorly controlled     -SSI   -added low dose amaryl on 3/16, increase to '4mg'$  on 3/22-monitor, not a good candidate for Metformin due to CKD CBG (last 3)  Recent Labs    06/10/17 1635 06/10/17 2112 06/11/17 0545  GLUCAP 199* 218* 163*   Increase lantus,25U 4/1 , increase to 30U 4/2 , increase to 35U 4/5 LOS (Days) 22 A FACE TO FACE EVALUATION WAS PERFORMED  ACharlett Blake4/07/2017, 9:39 AM

## 2017-06-12 ENCOUNTER — Inpatient Hospital Stay (HOSPITAL_COMMUNITY): Payer: Medicare Other | Admitting: Physical Therapy

## 2017-06-12 LAB — GLUCOSE, CAPILLARY
GLUCOSE-CAPILLARY: 163 mg/dL — AB (ref 65–99)
Glucose-Capillary: 193 mg/dL — ABNORMAL HIGH (ref 65–99)
Glucose-Capillary: 219 mg/dL — ABNORMAL HIGH (ref 65–99)
Glucose-Capillary: 170 mg/dL — ABNORMAL HIGH (ref 65–99)

## 2017-06-12 NOTE — Progress Notes (Signed)
Physical Therapy Session Note  Patient Details  Name: Edwin Jones MRN: [id number] Date of Birth: 06/27/1940  Today's Date: 06/12/2017 PT Individual Time: 0845-0955 PT Individual Time Calculation (min): 70 min   Short Term Goals: Week 4:  PT Short Term Goal 1 (Week 4): =LTGs due to ELOS  Skilled Therapeutic Interventions/Progress Updates:   Pt in supine and agreeable to therapy, no c/o pain. First half of session focused on performing bed mobility and dressing as it he was at home and only receiving set-up to min assist for clothing management. Transferred to EOB w/ supervision w/o use of hospital bed functions. Verbal and tactile cues for technique to put on shirt and total assist to thread pants and min assist to pull pants up over hips once in standing. Total assist to don shoes. Pt required min-mod assist to correct LOB posteriorly when performing dynamic balance tasks in sitting. Emphasized tasks that wife could do to assist pt and tasks pt would need to perform as much by himself as possible 2/2 physical limits of wife. Transferred to w/c via stand pivot w/ mod assist, suspect 2/2 pt's fatigue after having spent 20+ minutes going through dressing tasks. Educated pt on taking breaks in between fatiguing tasks to ensure safety. 2nd half of session focused on negotiating 7" step in parallel bars to practice sequencing and balance negotiating 7" curb at home. Min guard for safety, otherwise verbal cues only for sequencing and step-to pattern. Performed multiple times ascending and descending. Additionally performed turns in parallel bars while performing task w/ min verbal cues for sequencing of step placement. Returned to room and ended session in recliner, call bell within reach and all needs met. Chair alarm activated and quick release belt engaged.   Therapy Documentation Precautions:  Precautions Precautions: Fall Precaution Comments: apraxic, R hemi, R inattention Restrictions Weight  Bearing Restrictions: No Vital Signs: Therapy Vitals Temp: 98.4 F (36.9 C) Temp Source: Oral Pulse Rate: 81 Resp: 18 BP: (!) 142/89 Patient Position (if appropriate): Lying Oxygen Therapy SpO2: 99 % O2 Device: Room Air Pain: Pain Assessment Pain Scale: 0-10 Pain Score: 0-No pain  See Function Navigator for Current Functional Status.   Therapy/Group: Individual Therapy   K Arnette 06/12/2017, 9:54 AM  

## 2017-06-12 NOTE — Progress Notes (Signed)
Patient ID: Edwin Jones, male   DOB: February 08, 1941, 77 y.o.   MRN: [id number]   Edwin Jones is a 77 y.o. male who was admitted with functional deficits secondary to a left basal ganglia hemorrhagic infarct with right hemiparesis    Subjective: No new complaints. No new problems. Slept well.   Past Medical History:  Diagnosis Date  . Cancer ([place])   . COPD (chronic obstructive pulmonary disease) ([place])   . Diabetes mellitus without complication ([place])   . GERD without esophagitis   . Hepatitis C virus    history of hep C treated with Harvoni  . Hypercholesterolemia   . Hypertension   . Seasonal allergies      Objective: Vital signs in last 24 hours: Temp:  [97.9 F (36.6 C)-98.4 F (36.9 C)] 98.4 F (36.9 C) (04/06 0700) Pulse Rate:  [81-87] 81 (04/06 0700) Resp:  [18-19] 18 (04/06 0700) BP: (142-167)/(89-96) 142/89 (04/06 0700) SpO2:  [99 %-100 %] 99 % (04/06 0700) Weight change:  Last BM Date: 06/11/17  Intake/Output from previous day: 04/05 0701 - 04/06 0700 In: 600 [P.O.:600] Out: -  Last cbgs: CBG (last 3)  Recent Labs    06/11/17 1646 06/11/17 2106 06/12/17 0715  GLUCAP 170* 250* 193*   Patient Vitals for the past 24 hrs:  BP Temp Temp src Pulse Resp SpO2  06/12/17 0700 (!) 142/89 98.4 F (36.9 C) Oral 81 18 99 %  06/11/17 1441 (!) 167/96 97.9 F (36.6 C) Oral 87 19 100 %     Physical Exam General: No apparent distress   HEENT: not dry Lungs: Normal effort. Lungs clear to auscultation, no crackles or wheezes. Cardiovascular: Regular rate and rhythm, no edema Abdomen: S/NT/ND; BS(+) Musculoskeletal:  unchanged Neurological: Dysphasic with right-sided weakness Wounds: N/A    Skin: clear    Mental state: Alert, oriented, cooperative    Lab Results: BMET    Component Value Date/Time   NA 135 06/11/2017 0639   K 4.9 06/11/2017 0639   CL 99 (L) 06/11/2017 0639   CO2 25 06/11/2017 0639   GLUCOSE 159 (H) 06/11/2017 0639   BUN 46 (H)  06/11/2017 0639   CREATININE 2.06 (H) 06/11/2017 0639   CALCIUM 9.7 06/11/2017 0639   GFRNONAA 29 (L) 06/11/2017 0639   GFRAA 34 (L) 06/11/2017 0639   CBC    Component Value Date/Time   WBC 5.0 05/21/2017 0431   RBC 3.59 (L) 05/21/2017 0431   HGB 11.0 (L) 05/21/2017 0431   HCT 34.2 (L) 05/21/2017 0431   PLT 310 05/21/2017 0431   MCV 95.3 05/21/2017 0431   MCH 30.6 05/21/2017 0431   MCHC 32.2 05/21/2017 0431   RDW 13.0 05/21/2017 0431   LYMPHSABS 1.0 05/21/2017 0431   MONOABS 0.6 05/21/2017 0431   EOSABS 0.2 05/21/2017 0431   BASOSABS 0.0 05/21/2017 0431    Medications: I have reviewed the patient's current medications.  Assessment/Plan:  Functional deficits secondary to left basal ganglia hemorrhagic infarct with right hemiparesis and dysphasia Diabetes mellitus.  Continue present regimen.  Continue SSI.  Lantus titrated Essential hypertension Chronic kidney disease.  Creatinine remains stable but trending up COPD/lung cancer    Length of stay, days: 23  Edwin Jones , MD 06/12/2017, 10:32 AM

## 2017-06-13 LAB — GLUCOSE, CAPILLARY
GLUCOSE-CAPILLARY: 144 mg/dL — AB (ref 65–99)
Glucose-Capillary: 134 mg/dL — ABNORMAL HIGH (ref 65–99)
Glucose-Capillary: 235 mg/dL — ABNORMAL HIGH (ref 65–99)
Glucose-Capillary: 190 mg/dL — ABNORMAL HIGH (ref 65–99)

## 2017-06-13 NOTE — Progress Notes (Signed)
Patient ID: Edwin Jones, male   DOB: 05/14/1940, 77 y.o.   MRN: [id number]   Edwin Jones is a 77 y.o. 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  Comfortable night.  Sitting in wheelchair  Past Medical History:  Diagnosis Date  . Cancer ([place])   . COPD (chronic obstructive pulmonary disease) ([place])   . Diabetes mellitus without complication ([place])   . GERD without esophagitis   . Hepatitis C virus    history of hep C treated with Harvoni  . Hypercholesterolemia   . Hypertension   . Seasonal allergies      Objective: Vital signs in last 24 hours: Temp:  [97.8 F (36.6 C)-98 F (36.7 C)] 98 F (36.7 C) (04/07 0402) Pulse Rate:  [79-104] 104 (04/07 0402) Resp:  [16-20] 20 (04/07 0356) BP: (119-157)/(82-98) 119/82 (04/07 0402) SpO2:  [99 %-100 %] 99 % (04/07 0356) Weight change:  Last BM Date: 06/11/17  Intake/Output from previous day: 04/06 0701 - 04/07 0700 In: 960 [P.O.:960] Out: -  Last cbgs: CBG (last 3)  Recent Labs    06/12/17 1641 06/12/17 2141 06/13/17 0631  GLUCAP 219* 170* 134*   Patient Vitals for the past 24 hrs:  BP Temp Temp src Pulse Resp SpO2  06/13/17 0402 119/82 98 F (36.7 C) Oral (!) 104 - -  06/13/17 0359 (!) 149/98 98 F (36.7 C) Oral 92 - -  06/13/17 0356 (!) 157/85 98 F (36.7 C) Oral 79 20 99 %  06/12/17 1330 130/84 97.8 F (36.6 C) Oral 93 16 100 %    Physical Exam General: No apparent distress   HEENT:  Single remaining tooth Lungs: Normal effort. Lungs clear to auscultation, no crackles or wheezes. Cardiovascular: Regular rate and rhythm, no edema Abdomen: S/NT/ND; BS(+) Musculoskeletal:  unchanged Neurological: No new neurological deficits; dysphasia with right-sided weakness Extremities.  No edema Skin: clear   Mental state: Alert, oriented, cooperative    Lab Results: BMET    Component Value Date/Time   NA 135 06/11/2017 0639   K 4.9 06/11/2017 0639   CL 99 (L) 06/11/2017 0639   CO2 25 06/11/2017 0639   GLUCOSE 159 (H) 06/11/2017 0639   BUN 46 (H) 06/11/2017 0639   CREATININE 2.06 (H) 06/11/2017 0639   CALCIUM 9.7 06/11/2017 0639   GFRNONAA 29 (L) 06/11/2017 0639   GFRAA 34 (L) 06/11/2017 0639   CBC    Component Value Date/Time   WBC 5.0 05/21/2017 0431   RBC 3.59 (L) 05/21/2017 0431   HGB 11.0 (L) 05/21/2017 0431   HCT 34.2 (L) 05/21/2017 0431   PLT 310 05/21/2017 0431   MCV 95.3 05/21/2017 0431   MCH 30.6 05/21/2017 0431   MCHC 32.2 05/21/2017 0431   RDW 13.0 05/21/2017 0431   LYMPHSABS 1.0 05/21/2017 0431   MONOABS 0.6 05/21/2017 0431   EOSABS 0.2 05/21/2017 0431   BASOSABS 0.0 05/21/2017 0431     Medications: I have reviewed the patient's current medications.  Assessment/Plan:  Status post left basal ganglia hemorrhagic infarct with dysphasia and right hemiparesis Diabetes mellitus.  Continue basal bolus insulin.  Blood sugars well controlled today Essential hypertension.  No change in therapy Chronic kidney disease History of COPD/lung cancer    Length of stay, days: 24  Nyoka Cowden , MD 06/13/2017, 9:49 AM

## 2017-06-14 ENCOUNTER — Inpatient Hospital Stay (HOSPITAL_COMMUNITY): Payer: Medicare Other | Admitting: Occupational Therapy

## 2017-06-14 ENCOUNTER — Inpatient Hospital Stay (HOSPITAL_COMMUNITY): Payer: Medicare Other

## 2017-06-14 ENCOUNTER — Other Ambulatory Visit: Payer: Self-pay

## 2017-06-14 ENCOUNTER — Inpatient Hospital Stay (HOSPITAL_COMMUNITY): Payer: Medicare Other | Admitting: Speech Pathology

## 2017-06-14 LAB — BASIC METABOLIC PANEL
ANION GAP: 16 — AB (ref 5–15)
BUN: 47 mg/dL — ABNORMAL HIGH (ref 6–20)
CALCIUM: 10 mg/dL (ref 8.9–10.3)
CHLORIDE: 99 mmol/L — AB (ref 101–111)
CO2: 18 mmol/L — AB (ref 22–32)
Creatinine, Ser: 2.22 mg/dL — ABNORMAL HIGH (ref 0.61–1.24)
GFR calc non Af Amer: 27 mL/min — ABNORMAL LOW (ref >60)
GFR, EST AFRICAN AMERICAN: 31 mL/min — AB (ref >60)
GLUCOSE: 190 mg/dL — AB (ref 65–99)
POTASSIUM: 4.8 mmol/L (ref 3.5–5.1)
Sodium: 133 mmol/L — ABNORMAL LOW (ref 135–145)

## 2017-06-14 LAB — GLUCOSE, CAPILLARY
GLUCOSE-CAPILLARY: 268 mg/dL — AB (ref 65–99)
Glucose-Capillary: 148 mg/dL — ABNORMAL HIGH (ref 65–99)
Glucose-Capillary: 193 mg/dL — ABNORMAL HIGH (ref 65–99)
Glucose-Capillary: 303 mg/dL — ABNORMAL HIGH (ref 65–99)

## 2017-06-14 MED ORDER — INSULIN GLARGINE 100 UNIT/ML ~~LOC~~ SOLN
40.0000 [IU] | Freq: Every day | SUBCUTANEOUS | Status: DC
  Administered 2017-06-14: 40 [IU] via SUBCUTANEOUS
  Filled 2017-06-14: qty 0.4

## 2017-06-14 MED ORDER — CLOPIDOGREL BISULFATE 75 MG PO TABS
75.0000 mg | ORAL_TABLET | Freq: Every day | ORAL | Status: DC
  Administered 2017-06-14 – 2017-06-15 (×2): 75 mg via ORAL
  Filled 2017-06-14 (×2): qty 1

## 2017-06-14 MED ORDER — PANTOPRAZOLE SODIUM 40 MG PO TBEC: 40.0000 mg | DELAYED_RELEASE_TABLET | Freq: Every day | ORAL | 0 refills | Status: AC

## 2017-06-14 MED ORDER — PREMIER PROTEIN SHAKE: 11.0000 [oz_av] | Freq: Four times a day (QID) | ORAL | 0 refills | Status: AC

## 2017-06-14 MED ORDER — INSULIN GLARGINE 100 UNIT/ML ~~LOC~~ SOLN: 40.0000 [IU] | Freq: Every day | SUBCUTANEOUS | 0 refills | Status: DC

## 2017-06-14 MED ORDER — INSULIN SYRINGES (DISPOSABLE) U-100 0.5 ML MISC: 1.0000 "application " | Freq: Every day | 0 refills | Status: DC

## 2017-06-14 MED ORDER — SODIUM CHLORIDE 0.9 % IV SOLN
75.0000 mL/h | INTRAVENOUS | Status: DC
  Administered 2017-06-14: 75 mL/h via INTRAVENOUS

## 2017-06-14 MED ORDER — GLIMEPIRIDE 4 MG PO TABS: 4.0000 mg | ORAL_TABLET | Freq: Every day | ORAL | 0 refills | Status: DC

## 2017-06-14 MED ORDER — NYSTATIN 100000 UNIT/ML MT SUSP: 5.0000 mL | Freq: Four times a day (QID) | OROMUCOSAL | 0 refills | Status: DC

## 2017-06-14 MED ORDER — ASPIRIN 81 MG PO CHEW: 324.0000 mg | CHEWABLE_TABLET | Freq: Every day | ORAL | Status: AC

## 2017-06-14 MED ORDER — CLOPIDOGREL BISULFATE 75 MG PO TABS: 75.0000 mg | ORAL_TABLET | Freq: Every day | ORAL | 0 refills | Status: DC

## 2017-06-14 NOTE — Progress Notes (Signed)
Occupational Therapy Discharge Summary  Patient Details  Name: Edwin Jones MRN: [id number] Date of Birth: Dec 13, 1940  Today's Date: 06/14/2017 OT Individual Time: 0900-1000 OT Individual Time Calculation (min): 60 min   OT treatment session focused on increased independence with BADL tasks. Pt transferred OOB with supervision and increased time. Stand-pivot to wc on L side with min A and verbal cues for hand placement and set-up. Sponge bath completed at the sink with focus on standing balance, standing tolerance, and functional use of R UE. Multiple sit<>stands with supervision and cues for hand placement. Pt tolerated standing for ~ 5 mins while washing peri-area, buttocks, and UB- supervision overall for balance and min cues to check in the mirror for balance. Pt able to recall hemi dressing techniques with min cues to thread pants and upper extremeties today. Supervision sit<>stand and for balance while pt pulled up pants. Pt needed assist for shoes and foot-up brace. Pt left seated in wc at end of session with safety belt on and needs met.   Patient has met 7 of 10 long term goals due to improved activity tolerance, improved balance, postural control, ability to compensate for deficits, functional use of  RIGHT upper and RIGHT lower extremity, improved attention, improved awareness and improved coordination.  Patient to discharge at [facility] Assist level.  Patient's care partner is independent to provide the necessary physical and cognitive assistance at discharge for functional transfers, LB dressing, and higher level BADL tasks.   Reasons goals not met:  Pt needs min/mod A for LB dressing- he can don pants  with min/supervision, but needs assistance with socks, shoes, and foot-up brace. Pt needs min A for toilet transfers. Pt needs min A for tub shower transfers.  Recommendation:  Patient will benefit from ongoing skilled OT services in home health setting to continue to advance  functional skills in the area of BADL and Reduce care partner burden.  Equipment: tub transfer bench, 3-in-1 BSC, see PT note for mobility devices  Reasons for discharge: treatment goals met and discharge from hospital  Patient/family agrees with progress made and goals achieved: Yes  OT Discharge Precautions/Restrictions  Precautions Precautions: Fall Precaution Comments: apraxic, R hemi, R inattention Restrictions Weight Bearing Restrictions: No Pain Pain Assessment Pain Score: 0-No pain ADL ADL Upper Body Bathing: Modified independent Lower Body Bathing: Supervision/safety Upper Body Dressing: Supervision/safety, Setup, Minimal cueing Lower Body Dressing: Minimal cueing, Setup, Minimal assistance Toileting: Setup, Supervision/safety Toilet Transfer: Minimal assistance Tub/Shower Transfer: Minimal assistance ADL Comments: Please see functional navigator Perception  Perception: Impaired Inattention/Neglect: Does not attend to right side of body;Does not attend to right visual field Praxis Praxis: Impaired Praxis Impairment Details: Motor planning Praxis-Other Comments: Improved since eval, but continuing deficits Cognition Overall Cognitive Status: Impaired/Different from baseline Arousal/Alertness: Awake/alert Orientation Level: Oriented X4 Attention: Selective Sustained Attention: Appears intact Sustained Attention Impairment: Verbal basic;Functional basic Selective Attention: Impaired Selective Attention Impairment: Verbal basic;Functional basic Memory: Impaired Memory Impairment: Decreased recall of new information;Decreased short term memory;Retrieval deficit;Storage deficit Decreased Short Term Memory: Verbal basic;Functional basic Awareness: Impaired Awareness Impairment: Emergent impairment Problem Solving: Impaired Problem Solving Impairment: Verbal complex;Functional complex Executive Function: Reasoning;Sequencing;Organizing;Decision Making Reasoning:  Impaired Reasoning Impairment: Verbal complex;Functional complex Sequencing: Impaired Sequencing Impairment: Verbal complex;Functional complex Organizing: Impaired Organizing Impairment: Verbal complex;Functional complex Decision Making: Impaired Decision Making Impairment: Verbal complex;Functional complex Self Monitoring: Impaired Self Monitoring Impairment: Verbal complex;Functional complex Self Correcting: Impaired Self Correcting Impairment: Verbal complex;Functional complex Safety/Judgment: Impaired Sensation Sensation Light Touch: Appears Intact Hot/Cold:  Appears Intact Coordination Gross Motor Movements are Fluid and Coordinated: No Fine Motor Movements are Fluid and Coordinated: No Coordination and Movement Description: R UE coordination continues to to delayed with decreased smoothness, improved since eval Motor  Motor Motor: Hemiplegia;Motor apraxia Motor - Discharge Observations: Ongoing R hemiplegia, improved since eval Mobility  Bed Mobility Supine to Sit: 5: Supervision;HOB elevated;With rails Transfers Sit to Stand: 5: Supervision;4: Min guard  Trunk/Postural Assessment  Cervical Assessment Cervical Assessment: Within Functional Limits Thoracic Assessment Thoracic Assessment: Within Functional Limits Lumbar Assessment Lumbar Assessment: Within Functional Limits Postural Control Postural Control: Within Functional Limits  Balance Balance Balance Assessed: Yes Static Sitting Balance Static Sitting - Level of Assistance: 5: Stand by assistance Dynamic Sitting Balance Dynamic Sitting - Level of Assistance: 5: Stand by assistance Static Standing Balance Static Standing - Level of Assistance: 5: Stand by assistance Dynamic Standing Balance Dynamic Standing - Level of Assistance: 5: Stand by assistance;4: Min assist Extremity/Trunk Assessment RUE Assessment RUE Assessment: Exceptions to WFL(strength is WFL, PROM of shoulder 120, -30 of tricep extension  PROM) RUE Strength RUE Overall Strength: Deficits RUE Overall Strength Comments: 3-/5 throughout R UE Right Shoulder Flexion: 3-/5 LUE Assessment LUE Assessment: Within Functional Limits   See Function Navigator for Current Functional Status.  Edwin Jones Edwin Jones 06/14/2017, 3:51 PM

## 2017-06-14 NOTE — Progress Notes (Addendum)
Physical Therapy Discharge Summary  Patient Details  Name: Edwin Jones MRN: [id number] Date of Birth: [birth year]  Today's Date: 06/14/2017 PT Individual Time:1000-1058, 1415-1500 PT Individual Time Calculation (min): 58 min and 45 min    Patient has met 8 of 11 long term goals due to improved activity tolerance, improved balance, improved postural control, increased strength, improved attention and improved awareness.  Patient to discharge at a wheelchair level [place].   Patient's care partner is independent to provide the necessary physical assistance at discharge.  Reasons goals not met: Pt did not meet ambulation goal as he requires min assist for all ambulation and increased cueing with complexity of environment during ambulation.   Recommendation:  Patient will benefit from ongoing skilled PT services in home health setting to continue to advance safe functional mobility, address ongoing impairments in balance, awareness, strength, gait , and minimize fall risk.  Equipment: w/c and RW  Reasons for discharge: treatment goals met  Patient/family agrees with progress made and goals achieved: Yes   PT Treatment Interventions: Session 1: Pt seated in w/c upon PT arrival, agreeable to therapy tx and denies pain. Pt propelled w/c from room>gym x 150 ft with supervision, L hemi technique. Pt performed car transfer stand pivot with RW and min assist, verbal cues for techniques and sequencing. Pt ambulated x 150 ft with supervision initially, fading to min assist with fatigue, requiring increased cues for R LE step length and manual facilitation for L lateral weightshift to encourage R swing through. Pt ascended/descended 4 steps (6 inch) with B handrails and min assist, therapist provided verbal cues for R UE awareness. Pt transported to rehab apartment. Pt performed stand pivot transfer without AD from w/c<>bed with supervision and verbal/tactile cues for techniques/sequencing. Pt  performed bed mobility sit<>supine with supervision, verbal cues for techniques and increased time to complete. Pt transported back to room and left seated in w/c with needs in reach, QRB in place and chair alarm set.   Session 2: Pt seated in w/c upon PT arrival, agreeable to therapy tx and denies pain. Pt's wife present for family training. Pt incontinent of bladder, performed sit<>stand with assist from wife, wife assisted with clothing management and changed briefs. Therapist educated pt and wife on stand pivot techniques to transfer w/c<>bed with and without RW,x 1 with therapist and x 2 with wife, min assist. Pt performed sit<>stands from w/c with emphasis on technique, x 5, therapist educated wife of the importance of set up. Pt left seated in w/c with needs in reach at end of session.    PT Discharge Precautions/Restrictions Precautions Precautions: Fall Precaution Comments: apraxic, R hemi, R inattention Restrictions Weight Bearing Restrictions: No Cognition Overall Cognitive Status: Impaired/Different from baseline Arousal/Alertness: Awake/alert Orientation Level: Oriented to person;Oriented to place;Oriented to situation Attention: Sustained Sustained Attention: Impaired Selective Attention: Impaired Awareness: Impaired Safety/Judgment: Impaired Sensation Sensation Light Touch: Appears Intact Proprioception: Appears Intact Coordination Gross Motor Movements are Fluid and Coordinated: No Fine Motor Movements are Fluid and Coordinated: No Coordination and Movement Description: impaired coordination R UE/LE Heel Shin Test: impaired ROM and coordination on the RLE,  Motor  Motor Motor: Hemiplegia;Motor apraxia Motor - Skilled Clinical Observations: R hemiplegia   Bed Mobility Bed Mobility: Supine to Sit Rolling Right: 5: Supervision Rolling Left: 5: Supervision Supine to Sit: 5: Supervision Sit to Supine: 5: Supervision Transfers Transfers: Yes Sit to Stand: 5:  Supervision;4: Min guard Stand Pivot Transfers: 4: Min Barista Details: Verbal cues for  gait pattern;Verbal cues for safe use of DME/AE Trunk/Postural Assessment  Cervical Assessment Cervical Assessment: Within Functional Limits Thoracic Assessment Thoracic Assessment: Within Functional Limits Lumbar Assessment Lumbar Assessment: Within Functional Limits Postural Control Postural Control: Within Functional Limits  Balance Balance Balance Assessed: Yes Static Sitting Balance Static Sitting - Level of Assistance: 5: Stand by assistance Dynamic Sitting Balance Dynamic Sitting - Level of Assistance: 5: Stand by assistance Static Standing Balance Static Standing - Level of Assistance: 4: Min assist Dynamic Standing Balance Dynamic Standing - Level of Assistance: 4: Min assist Extremity Assessment  RLE Assessment RLE Assessment: Exceptions to WFL RLE Strength Right Hip Flexion: 3+/5 Right Knee Flexion: 4/5 Right Knee Extension: 4/5 Right Ankle Dorsiflexion: 3+/5 Right Ankle Plantar Flexion: 3+/5 LLE Assessment LLE Assessment: Within Functional Limits   See Function Navigator for Current Functional Status.   van Schagen, PT, DPT 06/14/2017, 10:31 AM  

## 2017-06-14 NOTE — Discharge Summary (Signed)
Physician Discharge Summary  Patient ID: Edwin Jones MRN: [id number] DOB/AGE: 04-25-1940 77 y.o.  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 EXAM: CHEST - 2 VIEW COMPARISON:  05/18/2017 FINDINGS: AP and lateral views of the chest obtained. Right lung clear. Similar appearance left hilar and parahilar opacity. No pulmonary edema or pleural effusion. Cardiopericardial silhouette is at upper limits of normal for size. Right Port-A-Cath again noted. The visualized bony structures of the thorax are intact. IMPRESSION: No substantial change. Electronically Signed   By: Misty Stanley M.D.   On: 05/20/2017 20:57   Ct Head Wo Contrast  Result Date: 05/18/2017 CLINICAL DATA:  77 y/o  M; intracranial hemorrhage for follow-up. EXAM: CT HEAD WITHOUT CONTRAST TECHNIQUE: Contiguous axial images were obtained from the base of the skull through the vertex without intravenous contrast. COMPARISON:  05/17/2017 CT head FINDINGS: Brain: Stable hemorrhage centered in left basal ganglia measuring 4.3 x 2.7 x 3.2 cm (volume = 19 cm^3). Stable surrounding edema extending into the left frontal lobe, left temporal lobe, left insula, and left midbrain with associated local mass effect resulting in partial effacement of left lateral ventricle and 4 mm of left-to-right midline shift. Stable additional subcentimeter focus of hemorrhage within the left  postcentral gyrus (series 3, image 28). No new intracranial hemorrhage, focal mass effect, or stroke identified. No extra-axial collection or effacement of basilar cisterns. Stable ventricle size. Vascular: Calcific atherosclerosis of carotid siphons. No hyperdense vessel identified. Skull: Normal. Negative for fracture or focal lesion. Sinuses/Orbits: No acute finding. Other: None. IMPRESSION: 1. Stable hematoma in left basal ganglia, 19 cc. Stable surrounding edema and associated mass effect with 4 mm left-to-right midline shift and partial effacement of left lateral ventricle. Stable subcentimeter focus of hemorrhage in left postcentral gyrus. 2. No new acute intracranial abnormality identified. Electronically Signed   By: Kristine Garbe M.D.   On: 05/18/2017 05:49   Ct Head Wo Contrast  Addendum Date: 05/17/2017   ADDENDUM REPORT: 05/17/2017 13:48 ADDENDUM: Critical Value/emergent results were called by telephone at the time of interpretation on 05/17/2017 at 1327 hours to Dr. Reesa Chew , who verbally acknowledged these results. We discussed that the intracranial hemorrhage appears acute, but occurs in the area of left basal ganglia infarction seen on 05/06/2017. If related, this would be a Heidelberg type 2 Bleed given the mass effect. Although a period of 10-11 days seems atypical for hemorrhagic conversion or post therapy bleeding after cerebral infarct. The patient is reportedly still on Plavix and heparin. I recommended she discuss with Neurology. Electronically Signed   By: Genevie Ann M.D.   On: 05/17/2017 13:48   Result Date: 05/17/2017 CLINICAL DATA:  77 year old male status post left ICA and MCA emergent large vessel occlusion on 05/06/2017 status post endovascular treatment. Worsening speech and weakness today. Initial encounter. EXAM: CT HEAD WITHOUT CONTRAST TECHNIQUE: Contiguous axial images were obtained from the base of the skull  through the vertex without intravenous contrast.  COMPARISON:  Post treatment brain MRI 05/06/2017 and earlier. FINDINGS: Brain: Lobulated hyperdense hemorrhage centered at the left basal ganglia area of infarct in February encompasses 43 x 27 x 32 millimeters (AP by transverse by CC) for an estimated intra-axial blood volume of 19 milliliters. There is surrounding edema. There is regional mass effect including rightward midline shift of 5 millimeters. Mass effect on the left lateral ventricle with no intraventricular or extra-axial extension of hemorrhage. Cerebral edema is superimposed on chronic bilateral white matter hypodensity. Edema tracks into the left thalamus and midbrain. The basilar cisterns remain normal. Below the midbrain the posterior fossa gray-white matter differentiation remains normal. No superimposed cytotoxic edema identified in the brain. Vascular: Calcified atherosclerosis at the skull base. Skull: No acute osseous abnormality identified. Sinuses/Orbits: Clear. Other: Leftward gaze deviation. Visualized scalp soft tissues are within normal limits. IMPRESSION: 1. Acute hemorrhage in the left basal ganglia. Estimated blood volume 19 mL. Surrounding edema, tracking through the left thalamus to the midbrain. Regional mass effect including partial effacement of the left lateral ventricle and rightward midline shift of 5 millimeters. 2. No intraventricular or extra-axial hemorrhage extension. No ventriculomegaly. 3. Underlying chronic small vessel disease. Electronically Signed: By: Genevie Ann M.D. On: 05/17/2017 13:19   Chest Port 1 View  Result Date: 05/18/2017 CLINICAL DATA:  Pneumonia. EXAM: PORTABLE CHEST 1 VIEW COMPARISON:  One-view chest x-ray 05/16/2017 FINDINGS: Superior segment left lower lobe or left upper lobe airspace disease has increased since the prior exam. No significant right-sided consolidation is present. Mild hilar prominence is noted. A right IJ Port-A-Cath is stable. A left pleural effusion is suspected. IMPRESSION: 1.  Progressive superior segment left lower lobe or left upper lobe airspace disease compatible with pneumonia. 2. Small left pleural effusion and basilar airspace disease, likely atelectasis. Electronically Signed   By: San Morelle M.D.   On: 05/18/2017 09:20    Labs:  Basic Metabolic Panel: BMP Latest Ref Rng & Units 06/14/2017 06/11/2017 06/04/2017  Glucose 65 - 99 mg/dL 190(H) 159(H) 188(H)  BUN 6 - 20 mg/dL 47(H) 46(H) 39(H)  Creatinine 0.61 - 1.24 mg/dL 2.22(H) 2.06(H) 1.94(H)  Sodium 135 - 145 mmol/L 133(L) 135 132(L)  Potassium 3.5 - 5.1 mmol/L 4.8 4.9 5.0  Chloride 101 - 111 mmol/L 99(L) 99(L) 100(L)  CO2 22 - 32 mmol/L 18(L) 25 20(L)  Calcium 8.9 - 10.3 mg/dL 10.0 9.7 9.6    CBC: CBC Latest Ref Rng & Units 05/21/2017 05/20/2017 05/19/2017  WBC 4.0 - 10.5 K/uL 5.0 5.4 5.5  Hemoglobin 13.0 - 17.0 g/dL 11.0(L) 10.2(L) 10.5(L)  Hematocrit 39.0 - 52.0 % 34.2(L) 32.2(L) 32.6(L)  Platelets 150 - 400 K/uL 310 290 285    CBG: Recent Labs  Lab 06/13/17 1147 06/13/17 1640 06/13/17 2124 06/14/17 0620 06/14/17 1105  GLUCAP 144* 235* 190* 148* 193*    Brief HPI:    Edwin Jones is an 77 y.o. male with history of COPD, Hep C, T2DM, small cell LUL lung cancer who was admitted on 05/06/17 with right facial droop and right sided weakness. He did not receive TPA secondary to concerns of potential brain metastasis. CTA brain done revealing demonstrating left M1 occlusion and patient underwent mechanical thrombectomy and ICA stenting. Follow up MRI/MRA brain showed restored patency of L-ICA and L-MCA with left basal ganglia infarct, small infarct in left amygdala and small number of scatterd tiny cortical infarcts in L-MCA territory. Dr. Erlinda Hong Dr. Rogue Bussing felt that stroke likely  cardioembolic and He was started on ASA/Plavix--loop recorder recommended to rule out A fib.   He was admitted to CIR on 05/11/17 due to functinal deficits from left sided weakness, fluctuating bouts of lethargy,  aphasia and dysphagia.  He developed fever of 101 on 3/10 with lethargy and was started on antibiotics due to concerns of PNA.  On 3/11, he was noted to have neurologic decline with worsening of cognition, non verbal state and difficulty with mobility due to acute left basal ganglia with edema and regional mass effect. ASA/Plavix were discontinued and he was transferred to acute hospital for closer monitoring.  He was monitored by neurology and follow up CT head was stable  Lethargy was resolving and he was downgraded to dysphagia 1 diet. He was cleared to start ASA 3/14 and CIR consulted to resume rehab course.     Hospital Course: Edwin Jones was admitted to rehab 05/20/2017 for inpatient therapies to consist of PT, ST and OT at least three hours five days a week. Past admission physiatrist, therapy team and rehab RN have worked together to provide customized collaborative inpatient rehab. Respiratory status has been stable and follow CXR showed no change. Blood pressures have been monitored on bid basis and have been stable without hypotensive episodes. Ritalin was added for activation but discontinued prior to discharge due to tachycardia.  He has been neurologically stable and Plavix was resumed prior to discharge. Wife had multiple concerns about use of blood thinners and has been educated on need to continue these as well as seeking medical assistance for any neurological changes.   Po intake has improved but he has had required encouragement to increase fluid intake. Diabetes has been monitored with ac/hs cbg checks and low dose Amaryl was resumed. Lantus was added for tighter control as patient not a candidate to have metformin resumed due to CKD.  Renal status has been monitored with serial checks and he was treated with a liter of IVF prior to discharge due to worsening for renal status. Wife has been advised to offer at least 5 glasses of fluid daily to help maintain hydration status. GERD has been  managed with Protonix on board. He is incontinent of bowel and bladder despite toileting attempts. He has progressed to min assist at wheelchair level and will continue to receive follow up [facility] after discharge.    Rehab course: During patient's stay in rehab weekly team conferences were held to monitor patient's progress, set goals and discuss barriers to discharge. At admission, patient required max assist with mobility and total to max assist with basic self care tasks.  He exhibited severe global aphasia, was non-verbal and unable to answer Y/N questions and had difficulty following basic commands. He  has had improvement in activity tolerance, balance, postural control as well as ability to compensate for deficits. He has had improvement in functional use RUE and RLE as well as improvement in awareness.   He requires min assist with cues and extra time with bathing and dressing. He requires min assist with cues for transfers and is able to ambulate 150' with supervision to min assist with manual facilitation of RLE with increased distance. He is able to propel his wheelchair with supervision.   He is able to follow one step commands with 100% accuracy and 2 step command with 60% accuracy. He requires min assist for basic tasks and  He needs mod multimodal cues for to express basic  want/needs at sentence level and for word finding deficits. Diet has been advanced to dysphagia 2 with min assist for safe swallow strategies. Extensive education has been done by wife regarding assistance needed mobility, all functional and cognitive  Tasks as wall as diet and need to encourage hydration. She has been encouraged to hire assistance with patient care as she declined SNF.     Disposition: Home   Diet: Dysphagia 2, thin liquids.   Special Instructions: 1. Needs assistance with meals and all activity 2. HHRN to draw BMET on 4/12 with results to Dr. Lennox Grumbles  3.  Check blood sugars at least bid ac.   Discharge Instructions    Ambulatory referral to Cardiology   Complete by:  As directed    Needs to be set up for cardiac monitor   Ambulatory referral to Physical Medicine Rehab   Complete by:  As directed    1-2 weeks transitional care appt     Allergies as of 06/15/2017      Reactions   Penicillin G Anaphylaxis   Other reaction(s): Other (See Comments) Couldn't breath among other things Has patient had a PCN reaction causing immediate rash, facial/tongue/throat swelling, SOB or lightheadedness with hypotension: Yes Has patient had a PCN reaction causing severe rash involving mucus membranes or skin necrosis: No Has patient had a PCN reaction that required hospitalization: Yes Has patient had a PCN reaction occurring within the last 10 years: No If all of the above answers are "NO", then may proceed with Ce   Shellfish-derived Products Anaphylaxis   Statins    Other reaction(s): Other (See Comments) Muscle spasms - can't walk - couldn't turn over in bed   Erythromycin Itching   All mycins   Tamsulosin    Other reaction(s): Dizziness   Tuberculin Ppd    Other reaction(s): Other (See Comments) False test    Iodinated Diagnostic Agents Hives      Medication List    STOP taking these medications   acyclovir ointment 5 % Commonly known as:  ZOVIRAX   Chlorhexidine Gluconate Cloth 2 % Pads   fludrocortisone 0.1 MG tablet Commonly known as:  FLORINEF   heparin 5000 UNIT/ML injection   insulin aspart 100 UNIT/ML injection Commonly known as:  novoLOG   midodrine 5 MG tablet Commonly known as:  PROAMATINE   ondansetron 4 MG/2ML Soln injection Commonly known as:  ZOFRAN     TAKE these medications   aspirin 81 MG chewable tablet Chew 4 tablets (324 mg total) by mouth daily.   blood glucose meter kit and supplies Kit Dispense based on patient and insurance preference. Use up to four times daily as directed. ICD: E11.9    clopidogrel 75 MG tablet Commonly known as:  PLAVIX Take 1 tablet (75 mg total) by mouth daily.   glimepiride 4 MG tablet Commonly known as:  AMARYL Take 1 tablet (4 mg total) by mouth daily with breakfast.   insulin glargine 100 UNIT/ML injection Commonly known as:  LANTUS Inject 0.4 mLs (40 Units total) into the skin at bedtime.   Insulin Syringes (Disposable) U-100 0.5 ML Misc 1 application by Does not apply route daily.   nystatin 100000 UNIT/ML suspension Commonly known as:  MYCOSTATIN Use as directed 5 mLs (500,000 Units total) in the mouth or throat 4 (four) times daily.   pantoprazole 40 MG tablet Commonly known as:  PROTONIX Take 1 tablet (40 mg total) by mouth daily.   protein supplement shake Liqd Commonly known  as:  PREMIER PROTEIN Take 325 mLs (11 oz total) by mouth 4 (four) times daily.   senna-docusate 8.6-50 MG tablet Commonly known as:  Senokot-S Take 1 tablet by mouth at bedtime as needed for mild constipation.      Follow-up Information    Kirsteins, Luanna Salk, MD Follow up.   Specialty:  Physical Medicine and Rehabilitation Why:  Office will call you with follow up appointment Contact information: [place] [address] [phone]        Garvin Fila, MD. Call in 1 day(s).   Specialties:  Neurology, Radiology Why:  for follow up appointment Contact information: [address] [place] [address] [phone]        Marguerita Merles, MD Follow up on 06/24/2017.   Specialty:  Family Medicine Why:  Appointment @ 2:00 PM Contact information: [place] [address] [phone]           Signed: Bary Leriche 06/15/2017, 5:56 PM

## 2017-06-14 NOTE — Progress Notes (Signed)
Speech Language Pathology Session Note and Discharge Summary  Patient Details  Name: Edwin Jones MRN: [id number] Date of Birth: [birth year]  Today's Date: 06/14/2017 SLP Individual Time: 1130-1200 SLP Individual Time Calculation (min): 30 min   Skilled Therapeutic Interventions: Skilled treatment session focused on communication goals. SLP facilitated session by administering cognitive-linguistic tasks. Pt able to answer basic yes/no questions with 100% accuracy and complex yes/no questions with 60% accuracy. Pt also followed 1-step directions with 100% accuracy; however, required Mod A verbal cues to follow 2-step directions with 100% accuracy. With Supervision verbal cues, pt demonstrated confrontational naming and responsive naming with 100% accuracy. Pt educated on the progress and verbalized understanding and agreement. Pt left upright in wheelchair with alarm on, quick release belt in place, and NT present. Continue with current plan of care.    Patient has met 9 of 9 long term goals.  Patient to discharge at overall Min;Mod level.    Clinical Impression/Discharge Summary: Patient has made functional gains and has met 9 out of 9 LTGs this admission due to improved cognitive-linguistic and swallowing functions. Currently, pt requires overall Min A verbal cues for basic auditory comprehension. Pt demonstrates word-finding at the sentence level and is able to express basic wants/needs at the sentence level with Mod A multimodal cues. Pt requires Mod A verbal cues to use speech intelligibility strategies to increase speech intelligibility to 90% at the sentence level. Pt also demonstrates basic problem solving with Mod A verbal cues during functional tasks. Pt consumes dys. 2 textures with thin liquids without overt s/s of aspiration and demonstrates self-monitor of right anterior spillage and right pocketing with Min A verbal cues. Pt education complete and pt will discharge home with 24-hour  supervision from family. Pt would benefit from f/u home health SLP services to maximize his cognitive-linguistic and swallowing functions in order to improve his overall functional independence and reduce caregiver burden.   Care Partner:  Caregiver Able to Provide Assistance: Yes  Type of Caregiver Assistance: Physical;Cognitive  Recommendation:  Home Health SLP  Rationale for SLP Follow Up: Maximize functional communication;Maximize cognitive function and independence;Maximize swallowing safety;Reduce caregiver burden   Equipment: N/A   Reasons for discharge: Treatment goals met   Patient/Family Agrees with Progress Made and Goals Achieved: Yes   Function:  Cognition Comprehension Comprehension assist level: Understands basic 90% of the time/cues < 10% of the time  Expression   Expression assist level: Expresses basic 50 - 74% of the time/requires cueing 25 - 49% of the time. Needs to repeat parts of sentences.  Social Interaction Social Interaction assist level: Interacts appropriately 90% of the time - Needs monitoring or encouragement for participation or interaction.  Problem Solving Problem solving assist level: Solves basic 50 - 74% of the time/requires cueing 25 - 49% of the time  Memory Memory assist level: Recognizes or recalls 50 - 74% of the time/requires cueing 25 - 49% of the time   Meredeth Ide  SLP - Student 06/14/2017, 12:33 PM

## 2017-06-14 NOTE — Plan of Care (Signed)
  Problem: RH SKIN INTEGRITY Goal: RH STG SKIN FREE OF INFECTION/BREAKDOWN Description Skin free of infection/breakdown with min assistance  Outcome: Progressing

## 2017-06-14 NOTE — Progress Notes (Addendum)
Subjective/Complaints: Doing ok with OT this am   Remains aphasic     ROS: ? Y/N accuracy   Objective: Vital Signs: Blood pressure 121/83, pulse (!) 130, temperature 98.5 F (36.9 C), temperature source Oral, resp. rate 18, height 6\' 3"  (1.905 m), weight 90.3 kg (199 lb 1.2 oz), SpO2 98 %. No results found. Results for orders placed or performed during the hospital encounter of 05/20/17 (from the past 72 hour(s))  Glucose, capillary     Status: Abnormal   Collection Time: 06/11/17 11:45 AM  Result Value Ref Range   Glucose-Capillary 155 (H) 65 - 99 mg/dL  Glucose, capillary     Status: Abnormal   Collection Time: 06/11/17  4:46 PM  Result Value Ref Range   Glucose-Capillary 170 (H) 65 - 99 mg/dL  Glucose, capillary     Status: Abnormal   Collection Time: 06/11/17  9:06 PM  Result Value Ref Range   Glucose-Capillary 250 (H) 65 - 99 mg/dL  Glucose, capillary     Status: Abnormal   Collection Time: 06/12/17  7:15 AM  Result Value Ref Range   Glucose-Capillary 193 (H) 65 - 99 mg/dL  Glucose, capillary     Status: Abnormal   Collection Time: 06/12/17 11:27 AM  Result Value Ref Range   Glucose-Capillary 163 (H) 65 - 99 mg/dL  Glucose, capillary     Status: Abnormal   Collection Time: 06/12/17  4:41 PM  Result Value Ref Range   Glucose-Capillary 219 (H) 65 - 99 mg/dL  Glucose, capillary     Status: Abnormal   Collection Time: 06/12/17  9:41 PM  Result Value Ref Range   Glucose-Capillary 170 (H) 65 - 99 mg/dL  Glucose, capillary     Status: Abnormal   Collection Time: 06/13/17  6:31 AM  Result Value Ref Range   Glucose-Capillary 134 (H) 65 - 99 mg/dL  Glucose, capillary     Status: Abnormal   Collection Time: 06/13/17 11:47 AM  Result Value Ref Range   Glucose-Capillary 144 (H) 65 - 99 mg/dL  Glucose, capillary     Status: Abnormal   Collection Time: 06/13/17  4:40 PM  Result Value Ref Range   Glucose-Capillary 235 (H) 65 - 99 mg/dL   Comment 1 Notify RN   Glucose,  capillary     Status: Abnormal   Collection Time: 06/13/17  9:24 PM  Result Value Ref Range   Glucose-Capillary 190 (H) 65 - 99 mg/dL   Comment 1 Notify RN   Glucose, capillary     Status: Abnormal   Collection Time: 06/14/17  6:20 AM  Result Value Ref Range   Glucose-Capillary 148 (H) 65 - 99 mg/dL     HEENT: poor dentition Cardio:Tachy rate 100 occ dropped beat  without murmur. No JVD  Resp: CTA Bilaterally without wheezes or rales. Normal effort  GI: BS positive and non tender Extremity:  No Edema Skin:   Intact and Other portacath site intact Neuro: lethargic and aphasic.  Abnormal Motor 3/5 RUE, 4/5 RLE , 5/5 on Left side,  Right inattention. Right facial droop Musc/Skel:  Other no pain with UE or LE ROM Gen NAD   Assessment/Plan: 1. 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 Physiatrist is providing close team supervision and 24 hour management of active medical problems listed below. Physiatrist and rehab team continue to assess barriers to discharge/monitor patient progress toward functional  and medical goals. FIM: Function - Bathing Position: Wheelchair/chair at sink Body parts bathed by patient: Right arm, Left arm, Chest, Abdomen, Buttocks, Front perineal area, Right upper leg, Left upper leg, Right lower leg, Left lower leg Body parts bathed by helper: Buttocks, Back Assist Level: Supervision or verbal cues  Function- Upper Body Dressing/Undressing What is the patient wearing?: Pull over shirt/dress Pull over shirt/dress - Perfomed by patient: Thread/unthread left sleeve, Thread/unthread right sleeve, Put head through opening, Pull shirt over trunk Pull over shirt/dress - Perfomed by helper: Thread/unthread right sleeve, Pull shirt over trunk Assist Level: Supervision or verbal cues Function - Lower Body Dressing/Undressing What is the  patient wearing?: Pants, Shoes, Socks Position: Wheelchair/chair at sink Underwear - Performed by patient: Thread/unthread right underwear leg, Thread/unthread left underwear leg Underwear - Performed by helper: Pull underwear up/down Pants- Performed by patient: Thread/unthread right pants leg, Thread/unthread left pants leg, Pull pants up/down Pants- Performed by helper: Thread/unthread right pants leg Non-skid slipper socks- Performed by helper: Don/doff right sock, Don/doff left sock Socks - Performed by helper: Don/doff right sock, Don/doff left sock Shoes - Performed by patient: Don/doff left shoe Shoes - Performed by helper: Don/doff right shoe, Fasten right, Fasten left AFO - Performed by helper: Don/doff right AFO Assist for footwear: Supervision/touching assist Assist for lower body dressing: Touching or steadying assistance (Pt > 75%)  Function - Toileting Toileting activity did not occur: No continent bowel/bladder event Toileting steps completed by patient: Adjust clothing prior to toileting, Adjust clothing after toileting Toileting steps completed by helper: Performs perineal hygiene Toileting Assistive Devices: Grab bar or rail Assist level: Touching or steadying assistance (Pt.75%)  Function - Air cabin crew transfer activity did not occur: Safety/medical concerns Toilet transfer assistive device: Grab bar, Elevated toilet seat/BSC over toilet Mechanical lift: Stedy Assist level to toilet: Touching or steadying assistance (Pt > 75%) Assist level from toilet: Touching or steadying assistance (Pt > 75%)  Function - Chair/bed transfer Chair/bed transfer method: Stand pivot Chair/bed transfer assist level: Moderate assist (Pt 50 - 74%/lift or lower) Chair/bed transfer assistive device: Armrests Mechanical lift: Stedy Chair/bed transfer details: Verbal cues for technique, Verbal cues for sequencing, Verbal cues for precautions/safety, Manual facilitation for  weight shifting  Function - Locomotion: Wheelchair Will patient use wheelchair at discharge?: Yes Type: Manual Max wheelchair distance: 150' Assist Level: Supervision or verbal cues Assist Level: Supervision or verbal cues Assist Level: Supervision or verbal cues Turns around,maneuvers to table,bed, and toilet,negotiates 3% grade,maneuvers on rugs and over doorsills: No Function - Locomotion: Ambulation Assistive device: Walker-rolling, Orthosis(R hand orthosis and toe up brace) Max distance: 52 ft Assist level: Touching or steadying assistance (Pt > 75%) Assist level: Touching or steadying assistance (Pt > 75%) Walk 50 feet with 2 turns activity did not occur: Safety/medical concerns Assist level: Touching or steadying assistance (Pt > 75%) Walk 150 feet activity did not occur: Safety/medical concerns Walk 10 feet on uneven surfaces activity did not occur: Safety/medical concerns  Function - Comprehension Comprehension: Auditory Comprehension assist level: Understands basic 90% of the time/cues < 10% of the time  Function - Expression Expression: Verbal Expression assist level: Expresses basic 50 - 74% of the time/requires cueing 25 - 49% of the time. Needs to repeat parts of sentences.  Function - Social Interaction Social Interaction assist level: Interacts appropriately 90% of the time - Needs monitoring or encouragement for participation or interaction.  Function - Problem Solving Problem solving assist level: Solves basic 50 - 74%  of the time/requires cueing 25 - 49% of the time  Function - Memory Memory assist level: Recognizes or recalls 25 - 49% of the time/requires cueing 50 - 75% of the time Patient normally able to recall (first 3 days only): That he or she is in a hospital, Staff names and faces  Medical Problem List and Plan: 1.   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  2. DVT Prophylaxis/Anticoagulation:  Pharmaceutical:Heparin--continue 3. Pain Management:tylenol prn 4. Mood:LCSW to follow for evaluation when appropriate. 5. Neuropsych: This patientis notcapable of making decisions on hisown behalf. 6. Skin/Wound Care:routine pressure relief measures. 7. Fluids/Electrolytes/Nutrition:Monitor I/O. Continue to offer supplements between meals.I 963ml yesterday 8.Hypertension:Hypotensionhas resolved and patient offMidodrine and Florinef Vitals:   06/14/17 0510 06/14/17 0513  BP: (!) 131/92 121/83  Pulse: (!) 101 (!) 130  Resp:    Temp: 98.5 F (36.9 C) 98.5 F (36.9 C)  SpO2:    Controlled 4/4- monitor-Await orthostatic vitals 9. Acute on chronic BJY:NWGNFAOZ SCr- 1.8-1.9. slightly above baseline- enc po,  BMP Latest Ref Rng & Units 06/11/2017 06/04/2017 05/28/2017  Glucose 65 - 99 mg/dL 159(H) 188(H) 205(H)  BUN 6 - 20 mg/dL 46(H) 39(H) 38(H)  Creatinine 0.61 - 1.24 mg/dL 2.06(H) 1.94(H) 1.90(H)  Sodium 135 - 145 mmol/L 135 132(L) 132(L)  Potassium 3.5 - 5.1 mmol/L 4.9 5.0 4.8  Chloride 101 - 111 mmol/L 99(L) 100(L) 98(L)  CO2 22 - 32 mmol/L 25 20(L) 21(L)  Calcium 8.9 - 10.3 mg/dL 9.7 9.6 9.2   10. ABLA: Monitor H/H for signs of bleedinghas been in 10- 11 range. 11 on 3/15  12. Lethargy: CVA related-improving    Trial Ritalin with some improvement in arousal noted, no tachycard will titrate up, discuss with team  13. Oral thrush:  nystatin mouthwash as well as oral care every 4 hours while awake. 14. COPD/Left lung cancer: On Breo with albuterol prn.  15.DM: poorly controlled     -SSI   -added low dose amaryl on 3/16, increase to 4mg  on 3/22-monitor, not a good candidate for Metformin due to CKD CBG (last 3)  Recent Labs    06/13/17 1640 06/13/17 2124 06/14/17 0620  GLUCAP 235* 190* 148*  lantus 35U 4/5, may increase to 40U  16 Tachycardia occ dropped beat on auscultation will D/C ritalin and check EKG  LOS (Days) 25 A FACE TO FACE EVALUATION WAS  PERFORMED  Edwin Jones 06/14/2017, 9:51 AM

## 2017-06-15 ENCOUNTER — Inpatient Hospital Stay (HOSPITAL_COMMUNITY): Payer: Medicare Other | Admitting: Occupational Therapy

## 2017-06-15 LAB — GLUCOSE, CAPILLARY: GLUCOSE-CAPILLARY: 106 mg/dL — AB (ref 65–99)

## 2017-06-15 MED ORDER — BLOOD GLUCOSE MONITOR KIT: PACK | 0 refills | Status: AC

## 2017-06-15 NOTE — Progress Notes (Signed)
Patient discharged home with spouse. No questions or complaints at this time.

## 2017-06-15 NOTE — Progress Notes (Signed)
Subjective/Complaints: Remains aphasic  , eats and drinks well with supervision per CNA   ROS: ? Y/N accuracy   Objective: Vital Signs: Blood pressure 140/86, pulse 83, temperature 98.4 F (36.9 C), temperature source Axillary, resp. rate 17, height '6\' 3"'$  (1.905 m), weight 90.3 kg (199 lb 1.2 oz), SpO2 99 %. No results found. Results for orders placed or performed during the hospital encounter of 05/20/17 (from the past 72 hour(s))  Glucose, capillary     Status: Abnormal   Collection Time: 06/12/17 11:27 AM  Result Value Ref Range   Glucose-Capillary 163 (H) 65 - 99 mg/dL  Glucose, capillary     Status: Abnormal   Collection Time: 06/12/17  4:41 PM  Result Value Ref Range   Glucose-Capillary 219 (H) 65 - 99 mg/dL  Glucose, capillary     Status: Abnormal   Collection Time: 06/12/17  9:41 PM  Result Value Ref Range   Glucose-Capillary 170 (H) 65 - 99 mg/dL  Glucose, capillary     Status: Abnormal   Collection Time: 06/13/17  6:31 AM  Result Value Ref Range   Glucose-Capillary 134 (H) 65 - 99 mg/dL  Glucose, capillary     Status: Abnormal   Collection Time: 06/13/17 11:47 AM  Result Value Ref Range   Glucose-Capillary 144 (H) 65 - 99 mg/dL  Glucose, capillary     Status: Abnormal   Collection Time: 06/13/17  4:40 PM  Result Value Ref Range   Glucose-Capillary 235 (H) 65 - 99 mg/dL   Comment 1 Notify RN   Glucose, capillary     Status: Abnormal   Collection Time: 06/13/17  9:24 PM  Result Value Ref Range   Glucose-Capillary 190 (H) 65 - 99 mg/dL   Comment 1 Notify RN   Glucose, capillary     Status: Abnormal   Collection Time: 06/14/17  6:20 AM  Result Value Ref Range   Glucose-Capillary 148 (H) 65 - 99 mg/dL  Glucose, capillary     Status: Abnormal   Collection Time: 06/14/17 11:05 AM  Result Value Ref Range   Glucose-Capillary 193 (H) 65 - 99 mg/dL  Basic metabolic panel     Status: Abnormal   Collection Time: 06/14/17 12:24 PM  Result Value Ref Range   Sodium 133  (L) 135 - 145 mmol/L   Potassium 4.8 3.5 - 5.1 mmol/L   Chloride 99 (L) 101 - 111 mmol/L   CO2 18 (L) 22 - 32 mmol/L   Glucose, Bld 190 (H) 65 - 99 mg/dL   BUN 47 (H) 6 - 20 mg/dL   Creatinine, Ser 2.22 (H) 0.61 - 1.24 mg/dL   Calcium 10.0 8.9 - 10.3 mg/dL   GFR calc non Af Amer 27 (L) >60 mL/min   GFR calc Af Amer 31 (L) >60 mL/min    Comment: (NOTE) The eGFR has been calculated using the CKD EPI equation. This calculation has not been validated in all clinical situations. eGFR's persistently <60 mL/min signify possible Chronic Kidney Disease.    Anion gap 16 (H) 5 - 15    Comment: Performed at [facility], [place] [address], [place], [place] [address]  Glucose, capillary     Status: Abnormal   Collection Time: 06/14/17  4:34 PM  Result Value Ref Range   Glucose-Capillary 303 (H) 65 - 99 mg/dL  Glucose, capillary     Status: Abnormal   Collection Time: 06/14/17  9:33 PM  Result Value Ref Range   Glucose-Capillary 268 (H)  65 - 99 mg/dL  Glucose, capillary     Status: Abnormal   Collection Time: 06/15/17  6:55 AM  Result Value Ref Range   Glucose-Capillary 106 (H) 65 - 99 mg/dL     HEENT: poor dentition Cardio:Tachy rate 100 occ dropped beat  without murmur. No JVD  Resp: CTA Bilaterally without wheezes or rales. Normal effort  GI: BS positive and non tender Extremity:  No Edema Skin:   Intact and Other portacath site intact Neuro: lethargic and aphasic.  Abnormal Motor 3/5 RUE, 4/5 RLE , 5/5 on Left side,  Right inattention. Right facial droop Musc/Skel:  Other no pain with UE or LE ROM Gen NAD   Assessment/Plan: 1. Functional deficits secondary to Left Basal ganglia hemorrhagic infarct  Stable for D/C today F/u PCP in 3-4 weeks F/u PM&R 2 weeks See D/C summary See D/C instructions FIM: Function - Bathing Position: Wheelchair/chair at sink Body parts bathed by patient: Right arm, Left arm, Chest, Abdomen, Buttocks, Front perineal area, Right upper leg, Left  upper leg, Right lower leg, Left lower leg Body parts bathed by helper: Buttocks, Back Assist Level: Supervision or verbal cues  Function- Upper Body Dressing/Undressing What is the patient wearing?: Pull over shirt/dress Pull over shirt/dress - Perfomed by patient: Thread/unthread left sleeve, Thread/unthread right sleeve, Put head through opening, Pull shirt over trunk Pull over shirt/dress - Perfomed by helper: Thread/unthread right sleeve, Pull shirt over trunk Assist Level: Supervision or verbal cues Function - Lower Body Dressing/Undressing What is the patient wearing?: Pants, Shoes, Socks Position: Wheelchair/chair at sink Underwear - Performed by patient: Thread/unthread right underwear leg, Thread/unthread left underwear leg Underwear - Performed by helper: Pull underwear up/down Pants- Performed by patient: Thread/unthread right pants leg, Thread/unthread left pants leg, Pull pants up/down Pants- Performed by helper: Thread/unthread right pants leg Non-skid slipper socks- Performed by helper: Don/doff right sock, Don/doff left sock Socks - Performed by helper: Don/doff right sock, Don/doff left sock Shoes - Performed by patient: Don/doff left shoe Shoes - Performed by helper: Don/doff right shoe, Fasten right, Fasten left AFO - Performed by helper: Don/doff right AFO Assist for footwear: Supervision/touching assist Assist for lower body dressing: Touching or steadying assistance (Pt > 75%)  Function - Toileting Toileting activity did not occur: No continent bowel/bladder event Toileting steps completed by patient: Adjust clothing prior to toileting, Adjust clothing after toileting Toileting steps completed by helper: Performs perineal hygiene Toileting Assistive Devices: Grab bar or rail Assist level: Touching or steadying assistance (Pt.75%)  Function - Air cabin crew transfer activity did not occur: Safety/medical concerns Toilet transfer assistive device: Grab  bar, Elevated toilet seat/BSC over toilet Mechanical lift: Stedy Assist level to toilet: Touching or steadying assistance (Pt > 75%) Assist level from toilet: Touching or steadying assistance (Pt > 75%)  Function - Chair/bed transfer Chair/bed transfer method: Stand pivot Chair/bed transfer assist level: Moderate assist (Pt 50 - 74%/lift or lower) Chair/bed transfer assistive device: Armrests Mechanical lift: Stedy Chair/bed transfer details: Verbal cues for technique, Verbal cues for sequencing, Verbal cues for precautions/safety, Manual facilitation for weight shifting  Function - Locomotion: Wheelchair Will patient use wheelchair at discharge?: Yes Type: Manual Max wheelchair distance: 150' Assist Level: Supervision or verbal cues Assist Level: Supervision or verbal cues Assist Level: Supervision or verbal cues Turns around,maneuvers to table,bed, and toilet,negotiates 3% grade,maneuvers on rugs and over doorsills: No Function - Locomotion: Ambulation Assistive device: Walker-rolling, Orthosis(R hand orthosis and toe up brace) Max distance: 52 ft Assist  level: Touching or steadying assistance (Pt > 75%) Assist level: Touching or steadying assistance (Pt > 75%) Walk 50 feet with 2 turns activity did not occur: Safety/medical concerns Assist level: Touching or steadying assistance (Pt > 75%) Walk 150 feet activity did not occur: Safety/medical concerns Walk 10 feet on uneven surfaces activity did not occur: Safety/medical concerns  Function - Comprehension Comprehension: Auditory Comprehension assist level: Understands basic 90% of the time/cues < 10% of the time  Function - Expression Expression: Verbal Expression assist level: Expresses basic 50 - 74% of the time/requires cueing 25 - 49% of the time. Needs to repeat parts of sentences.  Function - Social Interaction Social Interaction assist level: Interacts appropriately 90% of the time - Needs monitoring or encouragement  for participation or interaction.  Function - Problem Solving Problem solving assist level: Solves basic 50 - 74% of the time/requires cueing 25 - 49% of the time  Function - Memory Memory assist level: Recognizes or recalls 50 - 74% of the time/requires cueing 25 - 49% of the time Patient normally able to recall (first 3 days only): That he or she is in a hospital, Staff names and faces  Medical Problem List and Plan: 1.   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  2. DVT Prophylaxis/Anticoagulation: Pharmaceutical:Heparin--continue 3. Pain Management:tylenol prn 4. Mood:LCSW to follow for evaluation when appropriate. 5. Neuropsych: This patientis notcapable of making decisions on hisown behalf. 6. Skin/Wound Care:routine pressure relief measures. 7. Fluids/Electrolytes/Nutrition:Monitor I/O. Continue to offer supplements between meals.I 922m yesterday 8.Hypertension:Hypotensionhas resolved and patient offMidodrine and Florinef Vitals:   06/14/17 1304 06/15/17 0346  BP: (!) 156/85 140/86  Pulse: 91 83  Resp: 16 17  Temp: 97.8 F (36.6 C) 98.4 F (36.9 C)  SpO2: 100% 99%  Controlled 4/4- monitor-Await orthostatic vitals 9. 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 slightly above baseline-received IVFx 1 L, will enc po BMP Latest Ref Rng & Units 06/14/2017 06/11/2017 06/04/2017  Glucose 65 - 99 mg/dL 190(H) 159(H) 188(H)  BUN 6 - 20 mg/dL 47(H) 46(H) 39(H)  Creatinine 0.61 - 1.24 mg/dL 2.22(H) 2.06(H) 1.94(H)  Sodium 135 - 145 mmol/L 133(L) 135 132(L)  Potassium 3.5 - 5.1 mmol/L 4.8 4.9 5.0  Chloride 101 - 111 mmol/L 99(L) 99(L) 100(L)  CO2 22 - 32 mmol/L 18(L) 25 20(L)  Calcium 8.9 - 10.3 mg/dL 10.0 9.7 9.6   10. ABLA: Monitor H/H for signs of bleedinghas been in 10- 11 range. 11 on 3/15  12. Lethargy: CVA related-improving    Trial Ritalin with some improvement in arousal noted, no tachycard will titrate  up, discuss with team  13. Oral thrush:  nystatin mouthwash as well as oral care every 4 hours while awake. 14. COPD/Left lung cancer: On Breo with albuterol prn.  15.DM: poorly controlled     -SSI   -added low dose amaryl on 3/16, increase to '4mg'$  on 3/22-monitor, not a good candidate for Metformin due to CKD CBG (last 3)  Recent Labs    06/14/17 1634 06/14/17 2133 06/15/17 0655  GLUCAP 303* 268* 106*  lantus  40U wife need to learn administration  16 Tachycardia occ dropped beat on auscultation will D/C ritalin EKG unremarkable rate 83  LOS (Days) 26 A FACE TO FACE EVALUATION WAS PERFORMED  ACharlett Blake4/11/2017, 7:46 AM

## 2017-06-15 NOTE — Progress Notes (Signed)
Social Work  Discharge Note  The overall goal for the admission was met for:   Discharge location: Yes-HOME WITH WIFE WHO WILL PROVIDE 24 HR CARE  Length of Stay: Yes-26 DAYS  Discharge activity level: Yes-SUPERVISION-MIN ASSIST LEVEL  Home/community participation: Yes  Services provided included: MD, RD, PT, OT, SLP, RN, CM, TR, Pharmacy and SW  Financial Services: Medicare  Follow-up services arranged: Home Health: [facility], DME: [facility]-WHEELCHAIR, ROLLING WALKER, 3IN1, TUB BENCH and Patient/Family has no preference for HH/DME agencies  Comments (or additional information):WIFE WAS IN FOR FAMILY TRAINING MISSED OT SESSION WAS SUPPOSE TO COME DID NOT TOO MUCH TO DO AT HOME TO [place]. DISCUSSED DIFFICULTY IN TAKING CARE OF PT WITH HER OWN HEALTH ISSUES-INSISTED WOULD NOT PLACE IN A NH, FEELS EXTENDED FAMILY WILL HELP HER. PT AT [place].  Patient/Family verbalized understanding of follow-up arrangements: Yes  Individual responsible for coordination of the follow-up plan: [name]-WIFE  Confirmed correct DME delivered: Elease Hashimoto 06/15/2017    Elease Hashimoto

## 2017-06-15 NOTE — Discharge Instructions (Signed)
Inpatient Rehab Discharge Instructions  Edwin Jones Discharge date and time:  06/15/17  Activities/Precautions/ Functional Status: Activity: no lifting, driving, or strenuous exercise  till cleared by MD Diet: diabetic diet Make sure that he takes in at least 5 glasses of fluids daily to prevent dehydration.  Wound Care: N/A  Functional status:  ___ No restrictions     ___ Walk up steps independently _X__ 24/7 supervision/assistance   ___ Walk up steps with assistance ___ Intermittent supervision/assistance  ___ Bathe/dress independently ___ Walk with walker     _X__ Bathe/dress with assistance ___ Walk Independently    ___ Shower independently _X__ Walk with assistance    ___ Shower with assistance _X__ No alcohol     ___ Return to work/school ________   Special Instructions: 1. Check blood sugars at least twice a day before meals/bedime. 2. Needs assistance with meals and all activity.    COMMUNITY REFERRALS UPON DISCHARGE:    Home Health:   PT, OT, SP, AIDE  Agency:[facility]   [phone]   Date of last service:06/15/2017  Medical Equipment/Items Ordered:WHEELCHAIR, Vassie Moselle, [place]   [phone]   GENERAL COMMUNITY RESOURCES FOR PATIENT/FAMILY: Support Groups:[facility] SUPPORT GROUP EVERY SECOND Thursday @ 3:00-4:00 PM ON THE REHAB UNIT QUESTIONS CONTACT [place] [phone]   STROKE/TIA DISCHARGE INSTRUCTIONS SMOKING Cigarette smoking nearly doubles your risk of having a stroke & is the single most alterable risk factor  If you smoke or have smoked in the last 12 months, you are advised to quit smoking for your health.  Most of the excess cardiovascular risk related to smoking disappears within a year of stopping.  Ask you doctor about anti-smoking medications  [place] Quit Line: 1-800-QUIT NOW  Free Smoking Cessation Classes [phone]  CHOLESTEROL Know your levels; limit fat & cholesterol in  your diet  Lipid Panel     Component Value Date/Time   CHOL 173 05/07/2017 0500   TRIG 215 (H) 05/07/2017 0500   HDL 61 05/07/2017 0500   CHOLHDL 2.8 05/07/2017 0500   VLDL 43 (H) 05/07/2017 0500   LDLCALC 69 05/07/2017 0500      Many patients benefit from treatment even if their cholesterol is at goal.  Goal: Total Cholesterol (CHOL) less than 160  Goal:  Triglycerides (TRIG) less than 150  Goal:  HDL greater than 40  Goal:  LDL (LDLCALC) less than 100   BLOOD PRESSURE American Stroke Association blood pressure target is less that 120/80 mm/Hg  Your discharge blood pressure is:  BP: (!) 150/74  Monitor your blood pressure  Limit your salt and alcohol intake  Many individuals will require more than one medication for high blood pressure  DIABETES (A1c is a blood sugar average for last 3 months) Goal HGBA1c is under 7% (HBGA1c is blood sugar average for last 3 months)  Diabetes:     Lab Results  Component Value Date   HGBA1C 9.1 (H) 05/07/2017     Your HGBA1c can be lowered with medications, healthy diet, and exercise.  Check your blood sugar as directed by your physician  Call your physician if you experience unexplained or low blood sugars.  PHYSICAL ACTIVITY/REHABILITATION Goal is 30 minutes at least 4 days per week  Activity: No driving, Therapies: see above Return to work: N/A  Activity decreases your risk of heart attack and stroke and makes your heart stronger.  It helps control your weight and blood pressure; helps you relax and  can improve your mood.  Participate in a regular exercise program.  Talk with your doctor about the best form of exercise for you (dancing, walking, swimming, cycling).  DIET/WEIGHT Goal is to maintain a healthy weight  Your discharge diet is: DIET - DYS 1 Room service appropriate? Yes; Fluid consistency: Thin DIET DYS 2 Room service appropriate? Yes; Fluid consistency: Thin  liquids Your height is:  Height: 6\' 3"  (190.5  cm) Your current weight is: Weight: 93.9 kg (207 lb) Your Body Mass Index (BMI) is:  BMI (Calculated): 25.87  Following the type of diet specifically designed for you will help prevent another stroke.  You  goal weight  is:    Your goal Body Mass Index (BMI) is 19-24.  Healthy food habits can help reduce 3 risk factors for stroke:  High cholesterol, hypertension, and excess weight.  RESOURCES Stroke/Support Group:  Call [phone]   STROKE EDUCATION PROVIDED/REVIEWED AND GIVEN TO PATIENT Stroke warning signs and symptoms How to activate emergency medical system (call 911). Medications prescribed at discharge. Need for follow-up after discharge. Personal risk factors for stroke. Pneumonia vaccine given:  Flu vaccine given:  My questions have been answered, the writing is legible, and I understand these instructions.  I will adhere to these goals & educational materials that have been provided to me after my discharge from the hospital.     My questions have been answered and I understand these instructions. I will adhere to these goals and the provided educational materials after my discharge from the hospital.  Patient/Caregiver Signature _______________________________ Date __________  Clinician Signature _______________________________________ Date __________  Please bring this form and your medication list with you to all your follow-up doctor's appointments.

## 2017-06-17 ENCOUNTER — Telehealth: Payer: Self-pay | Admitting: Registered Nurse

## 2017-06-17 NOTE — Telephone Encounter (Signed)
Transitional Care call Transitional Care Call Completed, Appointment Confirmed, Address Confirmed, New Patient Packet Mailed Transitional Care Call Questions answered by wife Mrs. Haueter  Patient name: Edwin Jones  DOB: 06-13-1940 1. Are you/is patient experiencing any problems since coming home? No a. Are there any questions regarding any aspect of care? No 2. Are there any questions regarding medications administration/dosing? No a. Are meds being taken as prescribed? Yes b. "Patient should review meds with caller to confirm"  Medication List Reviewed 3. Have there been any falls? No 4. Has Home Health been to the house and/or have they contacted you? Yes, [facility] also states Occupational Therapist is the only person who has come to the home, will call [facility] to follow up she verbalizes understanding.  a. If not, have you tried to contact them? NA b. Can we help you contact them? Yes. 5. Are bowels and bladder emptying properly? Mr. Echavarria is incontinent of bowels and bladder she states.  a. Are there any unexpected incontinence issues? See Above. b. If applicable, is patient following bowel/bladder programs? NA 6. Any fevers, problems with breathing, unexpected pain? No 7. Are there any skin problems or new areas of breakdown? No 8. Has the patient/family member arranged specialty MD follow up (ie cardiology/neurology/renal/surgical/etc.)?  Ms. Medlen was instructed to  call Dr. Leonie Man office for follow up appointment, she verbalizes understanding.  a. Can we help arrange? NA 9. Does the patient need any other services or support that we can help arrange? No 10. Are caregivers following through as expected in assisting the patient? Yes 11. Has the patient quit smoking, drinking alcohol, or using drugs as recommended? Mrs. Rossa states Mr. Lammert doesn't smoke, drink alcohol or use illicit drugs.   Appointment date/time 06/21/2017 arrival time 1:45 for 2:15 appointment with  Dr. Letta Pate at [phone]

## 2017-06-17 NOTE — Telephone Encounter (Signed)
Edwin Jones ask this provider to call back in 30 minutes.

## 2017-06-18 ENCOUNTER — Telehealth: Payer: Self-pay

## 2017-06-18 NOTE — Telephone Encounter (Signed)
Cathy ST from [facility] called requesting verbal orders for ST for 1wk1, 2wk7, SW, and [place]. Verbal orders has been given per discharge summary.

## 2017-06-21 ENCOUNTER — Encounter: Payer: Medicare Other | Attending: Physical Medicine & Rehabilitation

## 2017-06-21 ENCOUNTER — Ambulatory Visit (HOSPITAL_BASED_OUTPATIENT_CLINIC_OR_DEPARTMENT_OTHER): Payer: Medicare Other | Admitting: Physical Medicine & Rehabilitation

## 2017-06-21 ENCOUNTER — Encounter: Payer: Self-pay | Admitting: Physical Medicine & Rehabilitation

## 2017-06-21 VITALS — BP 101/60 | HR 87 | Resp 14

## 2017-06-21 DIAGNOSIS — C3412 Malignant neoplasm of upper lobe, left bronchus or lung: Secondary | ICD-10-CM | POA: Diagnosis not present

## 2017-06-21 DIAGNOSIS — K219 Gastro-esophageal reflux disease without esophagitis: Secondary | ICD-10-CM | POA: Diagnosis not present

## 2017-06-21 DIAGNOSIS — Z87891 Personal history of nicotine dependence: Secondary | ICD-10-CM | POA: Insufficient documentation

## 2017-06-21 DIAGNOSIS — I10 Essential (primary) hypertension: Secondary | ICD-10-CM | POA: Insufficient documentation

## 2017-06-21 DIAGNOSIS — E7800 Pure hypercholesterolemia, unspecified: Secondary | ICD-10-CM | POA: Diagnosis not present

## 2017-06-21 DIAGNOSIS — I6389 Other cerebral infarction: Secondary | ICD-10-CM | POA: Diagnosis not present

## 2017-06-21 DIAGNOSIS — Z7982 Long term (current) use of aspirin: Secondary | ICD-10-CM | POA: Insufficient documentation

## 2017-06-21 DIAGNOSIS — I69320 Aphasia following cerebral infarction: Secondary | ICD-10-CM

## 2017-06-21 DIAGNOSIS — E119 Type 2 diabetes mellitus without complications: Secondary | ICD-10-CM | POA: Insufficient documentation

## 2017-06-21 DIAGNOSIS — J449 Chronic obstructive pulmonary disease, unspecified: Secondary | ICD-10-CM | POA: Insufficient documentation

## 2017-06-21 NOTE — Patient Instructions (Addendum)
See the Oncologist Dr Patrecia Pace  See the [facility] and Primary MD  for medical follow up

## 2017-06-21 NOTE — Progress Notes (Signed)
Subjective:    Patient ID: Edwin Jones, male    DOB: 1940/03/21, 77 y.o.   MRN: [id number] 77 y.o. male with history of COPD, Hep C, T2DM, small cell LUL lung cancer who was admitted on 05/06/17 with right facial droop and right sided weakness. He did not receive TPA secondary to concerns of potential brain metastasis. CTA brain done revealing demonstrating left M1 occlusion and patient underwent mechanical thrombectomy and ICA stenting. Follow up MRI/MRA brain showed restored patency of L-ICA and L-MCAwith left basal ganglia infarct, small infarct in left amygdala and small number of scatterd tiny cortical infarcts in L-MCA territory.Dr. Charlann Lange. Rogue Bussing felt that stroke likely cardioembolic and He was started on ASA/Plavix--loop recorder recommended to rule out A fib.  He was admitted to CIR on 05/11/17 due to functinal deficits from left sided weakness, fluctuating bouts of lethargy, aphasia and dysphagia.  He developed fever of 101 on 3/10 with lethargy and was started on antibiotics due to concerns of PNA.  On 3/11, he was noted to have neurologic decline with worsening of cognition, non verbal state and difficulty with mobility due to acute left basal ganglia with edema and regional mass effect. ASA/Plavix were discontinued and he was transferred to acute hospital for closer monitoring.  He was monitored by neurology and follow up CT head was stable  Lethargy was resolving and he was downgraded to dysphagia 1 diet. He was cleared to start ASA 3/14 and CIR consulted to resume rehab course.   HPI Patient is at home now wife is helping to care for patient along with help of son.  Patient's wife was asking about etiology of stroke.  We discussed that underlying history of cancer can cause hyper coagulable state.  Also discussed possibility of cardioembolism.  We also discussed that the patient had hemorrhagic stroke after his ischemic stroke.   Patient is ambulating with supervision to min assist  with therapy. Home therapy is starting.  The patient remains incontinent of bowel and bladder Patient remains a phasic with limited verbal output Pain Inventory Average Pain 8 Pain Right Now 8 My pain is sharp, burning and aching  In the last 24 hours, has pain interfered with the following? General activity 9 Relation with others 8 Enjoyment of life 9 What TIME of day is your pain at its worst? no selection  Sleep (in general) Fair  Pain is worse with: walking, bending, sitting, inactivity, standing and some activites Pain improves with: no selection Relief from Meds: no selection  Mobility walk with assistance use a walker how many minutes can you walk? 5 ability to climb steps?  yes use a wheelchair needs help with transfers  Function I need assistance with the following:  feeding, dressing, bathing, toileting, meal prep, household duties and shopping  Neuro/Psych bladder control problems bowel control problems weakness confusion depression anxiety suicidal thoughts-no active plans   Prior Studies transitional care  Physicians involved in your care transitional care   Family History  Problem Relation Age of Onset  . Skin cancer Mother   . Ovarian cancer Sister        sister was diagnosed as a teenager  . Throat cancer Brother 38       brother #1  . Lung cancer Sister 13  . Throat cancer Brother 82       brother #2  . Skin cancer Sister    Social History   Socioeconomic History  . Marital status: Married    Spouse name:  Not on file  . Number of children: Not on file  . Years of education: Not on file  . Highest education level: Not on file  Occupational History  . Not on file  Social Needs  . Financial resource strain: Not on file  . Food insecurity:    Worry: Not on file    Inability: Not on file  . Transportation needs:    Medical: Not on file    Non-medical: Not on file  Tobacco Use  . Smoking status: Former Smoker    Packs/day:  1.00    Years: 50.00    Pack years: 50.00    Types: Cigarettes    Last attempt to quit: 04/07/2004    Years since quitting: 13.2  . Smokeless tobacco: Never Used  Substance and Sexual Activity  . Alcohol use: No    Comment: history of alcohol use  . Drug use: No  . Sexual activity: Yes  Lifestyle  . Physical activity:    Days per week: Not on file    Minutes per session: Not on file  . Stress: Not on file  Relationships  . Social connections:    Talks on phone: Not on file    Gets together: Not on file    Attends religious service: Not on file    Active member of club or organization: Not on file    Attends meetings of clubs or organizations: Not on file    Relationship status: Not on file  Other Topics Concern  . Not on file  Social History Narrative  . Not on file   Past Surgical History:  Procedure Laterality Date  . COLONOSCOPY     approximately 11 years ago  . FLEXIBLE BRONCHOSCOPY N/A 11/05/2016   Procedure: FLEXIBLE BRONCHOSCOPY;  Surgeon: Wilhelmina Mcardle, MD;  Location: [facility] ORS;  Service: Pulmonary;  Laterality: N/A;  . IR FLUORO GUIDE CV LINE RIGHT  05/06/2017  . IR INTRAVSC STENT CERV CAROTID W/O EMB-PROT MOD SED INC ANGIO  05/06/2017  . IR PERCUTANEOUS ART THROMBECTOMY/INFUSION INTRACRANIAL INC DIAG ANGIO  05/06/2017  . IR US GUIDE VASC ACCESS LEFT  05/06/2017  . IR US GUIDE VASC ACCESS RIGHT  05/06/2017  . IR US GUIDE VASC ACCESS RIGHT  05/06/2017  . PORTA CATH INSERTION N/A 11/23/2016   Procedure: Glori Luis Cath Insertion;  Surgeon: Algernon Huxley, MD;  Location: [facility] CV LAB;  Service: Cardiovascular;  Laterality: N/A;  . RADIOLOGY WITH ANESTHESIA N/A 05/06/2017   Procedure: IR WITH ANESTHESIA;  Surgeon: Radiologist, Medication, MD;  Location: [facility];  Service: Radiology;  Laterality: N/A;  . TEE WITHOUT CARDIOVERSION N/A 05/10/2017   Procedure: TRANSESOPHAGEAL ECHOCARDIOGRAM (TEE);  Surgeon: Sanda Klein, MD;  Location: [facility] ENDOSCOPY;  Service: Cardiovascular;   Laterality: N/A;  . TONSILLECTOMY     Past Medical History:  Diagnosis Date  . Cancer ([place])   . COPD (chronic obstructive pulmonary disease) ([place])   . Diabetes mellitus without complication ([place])   . GERD without esophagitis   . Hepatitis C virus    history of hep C treated with Harvoni  . Hypercholesterolemia   . Hypertension   . Seasonal allergies    BP 101/60 (BP Location: Left Arm, Patient Position: Sitting, Cuff Size: Normal)   Pulse 87   Resp 14   SpO2 96%   Opioid Risk Score:   Fall Risk Score:  `1  Depression screen PHQ 2/9  Depression screen PHQ 2/9 02/17/2017  Decreased Interest 0  Down,  Depressed, Hopeless 0  PHQ - 2 Score 0    Review of Systems  Constitutional: Negative.   HENT: Negative.   Respiratory: Positive for cough and shortness of breath.   Gastrointestinal: Positive for constipation.       Bowel control problems   Genitourinary: Positive for difficulty urinating (bladder control problems).  Musculoskeletal: Negative.        Tightness  Skin: Negative.   Allergic/Immunologic: Negative.   Neurological: Positive for weakness and headaches.  Psychiatric/Behavioral: Positive for confusion, dysphoric mood and suicidal ideas. The patient is nervous/anxious.   All other systems reviewed and are negative.      Objective:   Physical Exam  Constitutional: He appears well-developed and well-nourished.  HENT:  Head: Normocephalic and atraumatic.  Eyes: Pupils are equal, round, and reactive to light. Conjunctivae and EOM are normal.  Neck: Normal range of motion.  Cardiovascular: Normal rate, regular rhythm and normal heart sounds.  No murmur heard. Pulmonary/Chest: Effort normal and breath sounds normal. No respiratory distress. He has no wheezes.  Abdominal: Soft. Bowel sounds are normal. He exhibits no distension. There is no tenderness.  Neurological: He is alert. He displays no atrophy and no tremor. He exhibits abnormal muscle tone.  Expressive  aphasia Motor strength is 3- in the right deltoid bicep grip right hip flexor knee extensor 2- at the ankle dorsiflexor 5/5 left deltoid bicep tricep grip hip flexor knee extensor ankle dorsiflexor reduced fine motor coordination in the right hand.    Nursing note and vitals reviewed.         Assessment & Plan:  1.  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  His cognitive deficits resulting in urinary and bowel incontinence. Continue home health PT OT speech Rehab follow-up in 1 month may be able to switch to outpatient at that time Recommend neurology follow-up for secondary stroke prevention 2.  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

## 2017-07-16 ENCOUNTER — Other Ambulatory Visit: Payer: Self-pay | Admitting: Family Medicine

## 2017-07-16 DIAGNOSIS — R519 Headache, unspecified: Secondary | ICD-10-CM

## 2017-07-16 DIAGNOSIS — I639 Cerebral infarction, unspecified: Secondary | ICD-10-CM

## 2017-07-16 DIAGNOSIS — R51 Headache: Principal | ICD-10-CM

## 2017-07-28 ENCOUNTER — Other Ambulatory Visit: Payer: Self-pay

## 2017-07-30 ENCOUNTER — Ambulatory Visit: Payer: Medicare Other | Admitting: Physical Medicine & Rehabilitation

## 2017-07-30 ENCOUNTER — Ambulatory Visit: Payer: Medicare Other

## 2017-08-04 ENCOUNTER — Other Ambulatory Visit: Payer: Self-pay

## 2017-08-11 ENCOUNTER — Telehealth: Payer: Self-pay

## 2017-08-11 NOTE — Telephone Encounter (Signed)
Kathy,SLP from [facility] called to request extension on pt therapy 2wk3, 1wk1. Verbal orders given

## 2017-08-13 ENCOUNTER — Other Ambulatory Visit: Payer: Self-pay

## 2017-08-16 NOTE — Patient Outreach (Signed)
First/Second/Third attempt to obtain mRS. No answer. Left message for returned call. 7

## 2017-09-08 ENCOUNTER — Telehealth: Payer: Self-pay | Admitting: *Deleted

## 2017-09-08 NOTE — Telephone Encounter (Signed)
Kathy ST called from [facility] to continue seeing Mr Deboer for aphasia 1wk1 2wk5.  Approval given.

## 2017-09-24 ENCOUNTER — Telehealth: Payer: Self-pay | Admitting: *Deleted

## 2017-09-24 NOTE — Telephone Encounter (Signed)
[facility] called requesting medical records for this patient. Please contact Hadly at [facility] for specific record request. She stated they received some records but not the dosimetry records. Records can be e-mailed to [email or web address] Nims  [phone] ext [phone]  She expressed urgency about this request.

## 2017-09-30 ENCOUNTER — Encounter

## 2017-09-30 ENCOUNTER — Ambulatory Visit: Payer: Non-veteran care | Admitting: Neurology

## 2018-01-14 DIAGNOSIS — I480 Paroxysmal atrial fibrillation: Secondary | ICD-10-CM | POA: Insufficient documentation

## 2018-06-29 IMAGING — CT CT HEAD W/O CM
3 of 4 series · 13 of 47 positions shown, 15 images · non-contrast
Comparison: Post treatment brain MRI 05/06/2017 and earlier.

ADDENDUM:
Critical Value/emergent results were called by telephone at the time
of interpretation on 05/17/2017 at 1268 hours to Dr. JACKEE ARONSON ,
who verbally acknowledged these results.

We discussed that the intracranial hemorrhage appears acute, but
occurs in the area of left basal ganglia infarction seen on
05/06/2017.
If related, this would be Etido Sylvanus type 2 Bleed given the mass
effect. Although a period of 10-11 days seems atypical for
hemorrhagic conversion or post therapy bleeding after cerebral
infarct. The patient is reportedly still on Plavix and heparin.
I recommended she discuss with Neurology.
CLINICAL DATA: 76-year-old male status post left ICA and MCA
emergent large vessel occlusion on 05/06/2017 status post
endovascular treatment. Worsening speech and weakness today. Initial
encounter.
EXAM:
CT HEAD WITHOUT CONTRAST
TECHNIQUE: Contiguous axial images were obtained from the base of the skull
through the vertex without intravenous contrast.

[Series 3: head wo · axial · 0.51mm/px · z∈[-128,-8]mm · 7 of 32 slices shown, 9 images]
[im 4/32  brain]
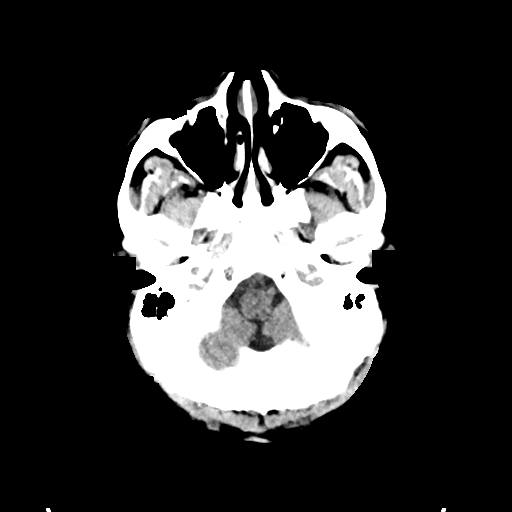
[im 4/32  bone]
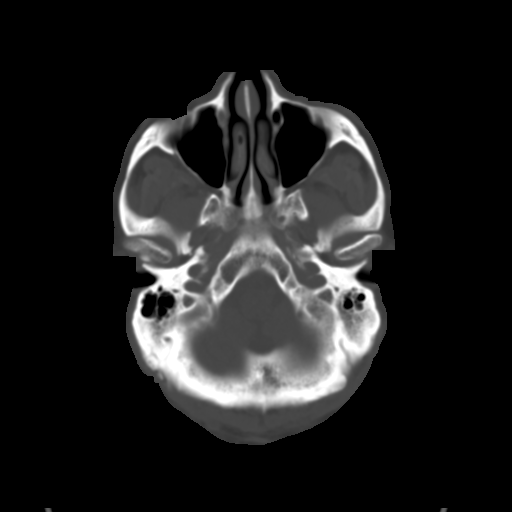
[im 8/32  brain]
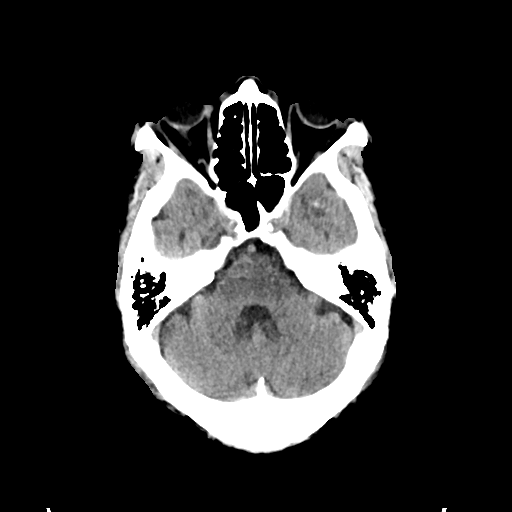
[im 12/32  brain]
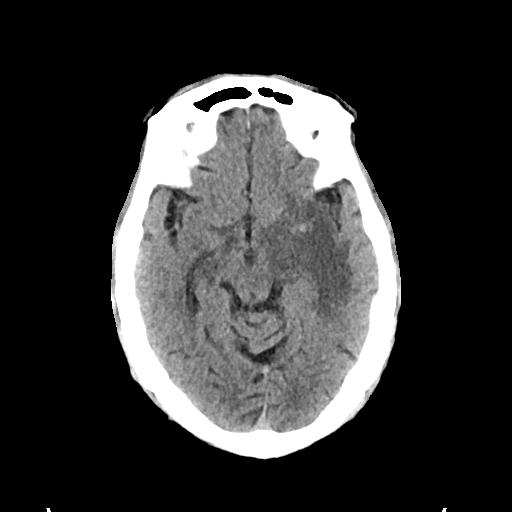
[im 16/32  brain]
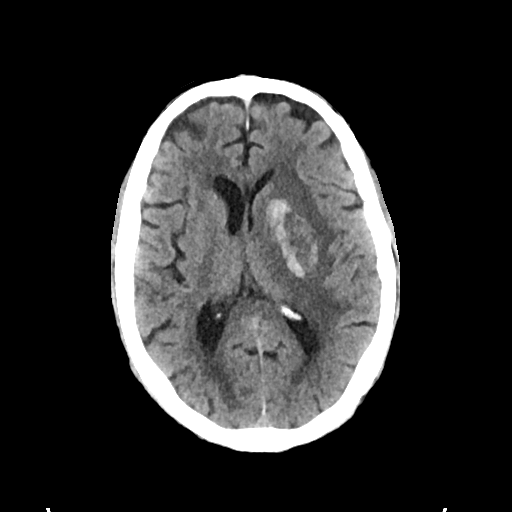
[im 20/32  brain]
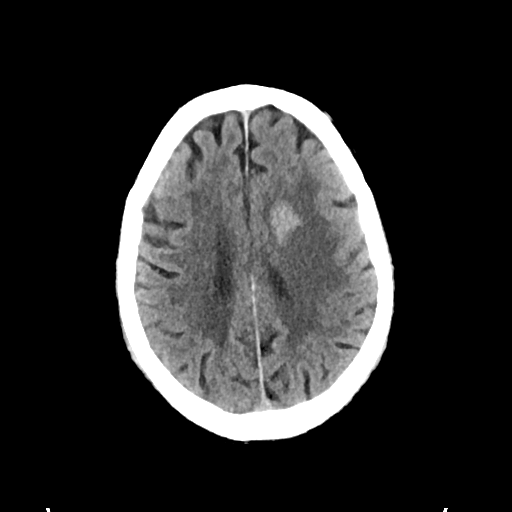
[im 20/32  bone]
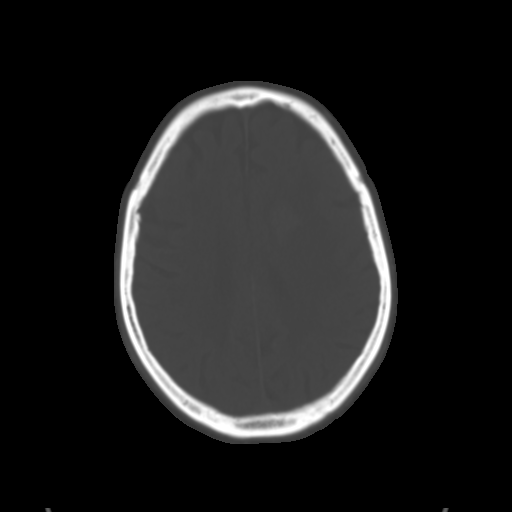
[im 24/32  brain]
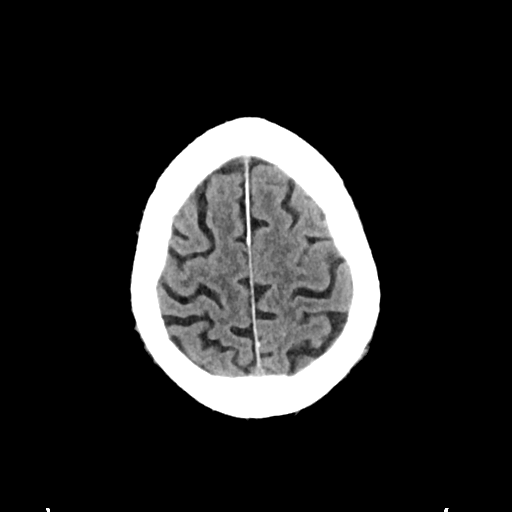
[im 28/32  brain]
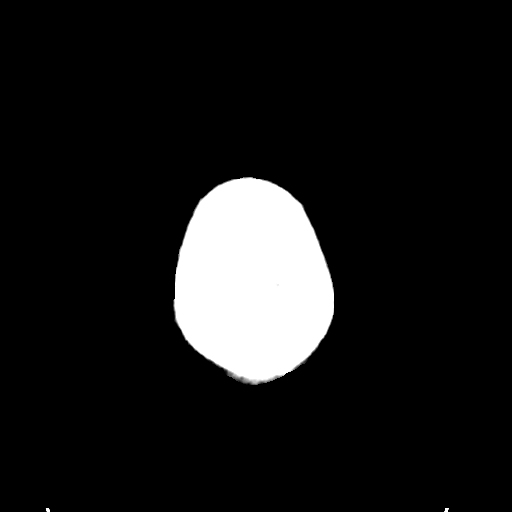

[Series 5: cor soft · coronal · 0.35mm/px · 3 of 71 slices shown]
[im 24/71  brain]
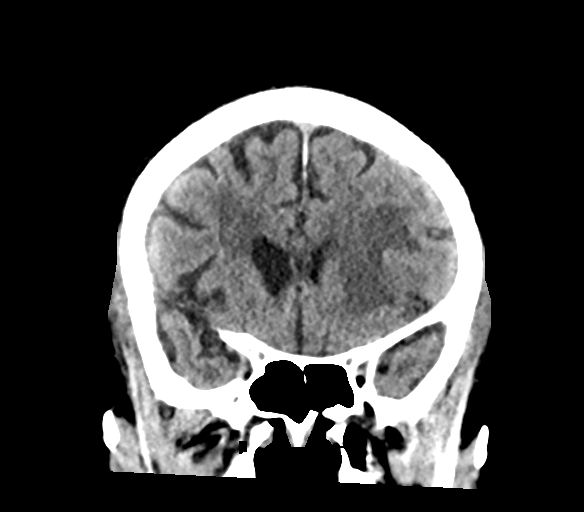
[im 32/71  brain]
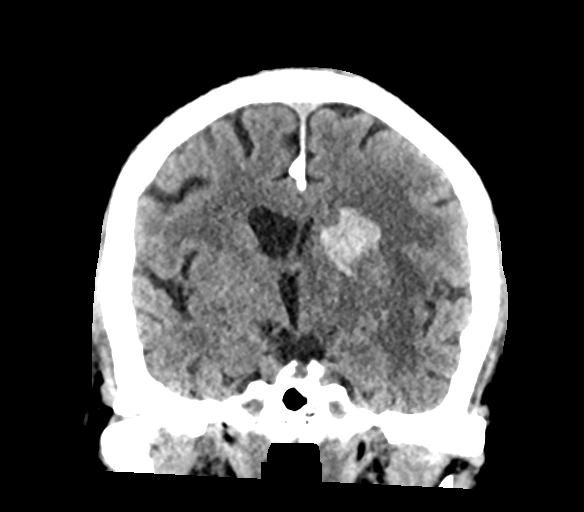
[im 39/71  brain]
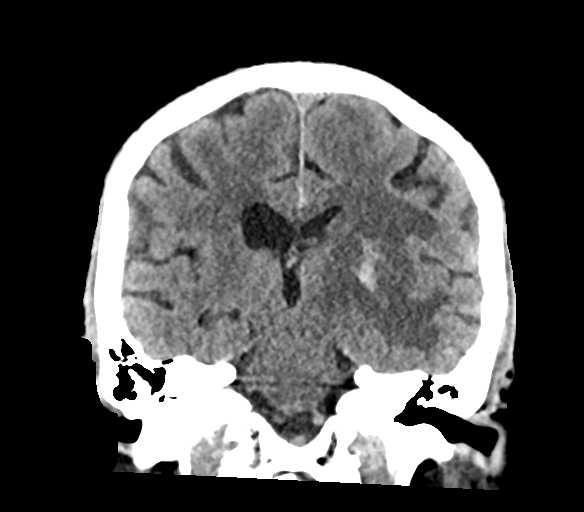

[Series 6: sag soft · sagittal · 0.30mm/px · 3 of 67 slices shown]
[im 23/67  brain]
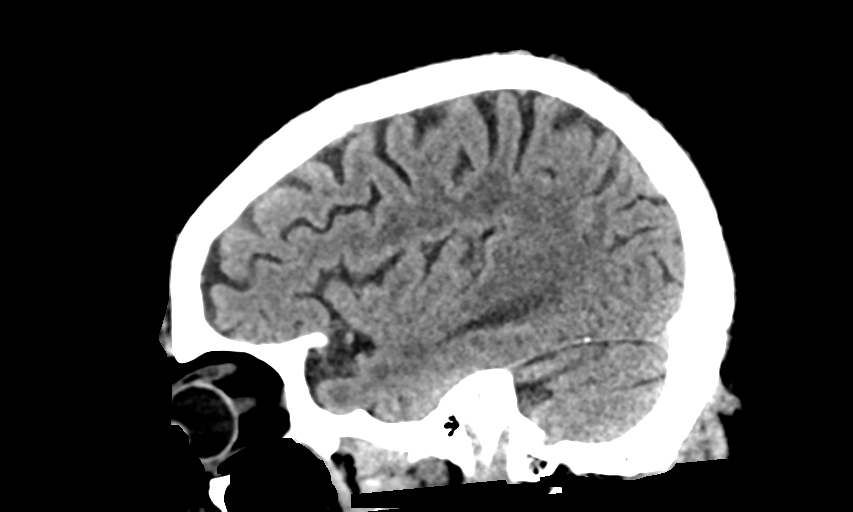
[im 34/67  brain]
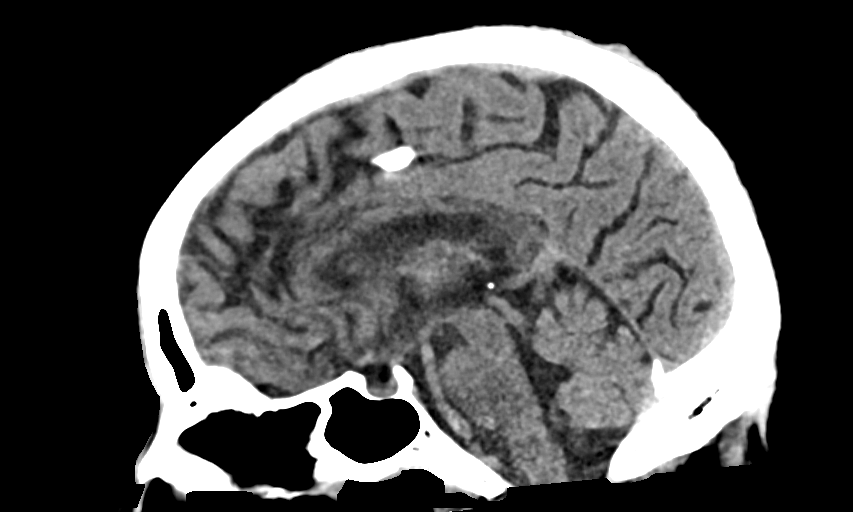
[im 45/67  brain]
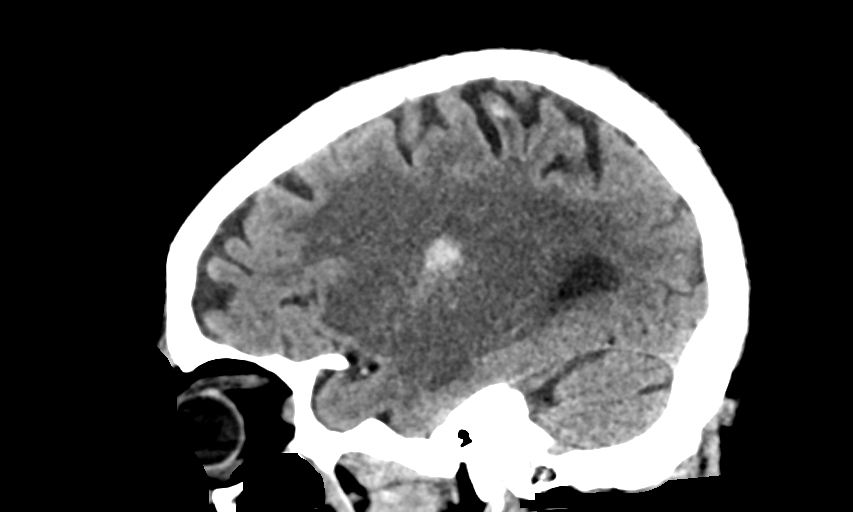

[13 of 47 positions shown; findings below may reference images not displayed]

FINDINGS: Brain: Lobulated hyperdense hemorrhage centered at the left basal
ganglia area of infarct in [DATE] x 27 x 32
millimeters (AP by transverse by CC) for an estimated intra-axial
blood volume of 19 milliliters. There is surrounding edema. There is
regional mass effect including rightward midline shift of 5
millimeters.

Mass effect on the left lateral ventricle with no intraventricular
or extra-axial extension of hemorrhage. Cerebral edema is
superimposed on chronic bilateral white matter hypodensity. Edema
tracks into the left thalamus and midbrain.

The basilar cisterns remain normal. Below the midbrain the posterior
fossa gray-white matter differentiation remains normal. No
superimposed cytotoxic edema identified in the brain.

Vascular: Calcified atherosclerosis at the skull base.

Skull: No acute osseous abnormality identified.

Sinuses/Orbits: Clear.

Other: Leftward gaze deviation. Visualized scalp soft tissues are
within normal limits.
IMPRESSION: 1. Acute hemorrhage in the left basal ganglia. Estimated blood
volume 19 mL.
Surrounding edema, tracking through the left thalamus to the
midbrain.
Regional mass effect including partial effacement of the left
lateral ventricle and rightward midline shift of 5 millimeters.
2. No intraventricular or extra-axial hemorrhage extension. No
ventriculomegaly.
3. Underlying chronic small vessel disease.

## 2018-06-30 IMAGING — DX DG CHEST 1V PORT
1 series · 1 of 1 positions shown · non-contrast
Comparison: One-view chest x-ray 05/16/2017

CLINICAL DATA: Pneumonia.

EXAM:
PORTABLE CHEST 1 VIEW

[chest ap]
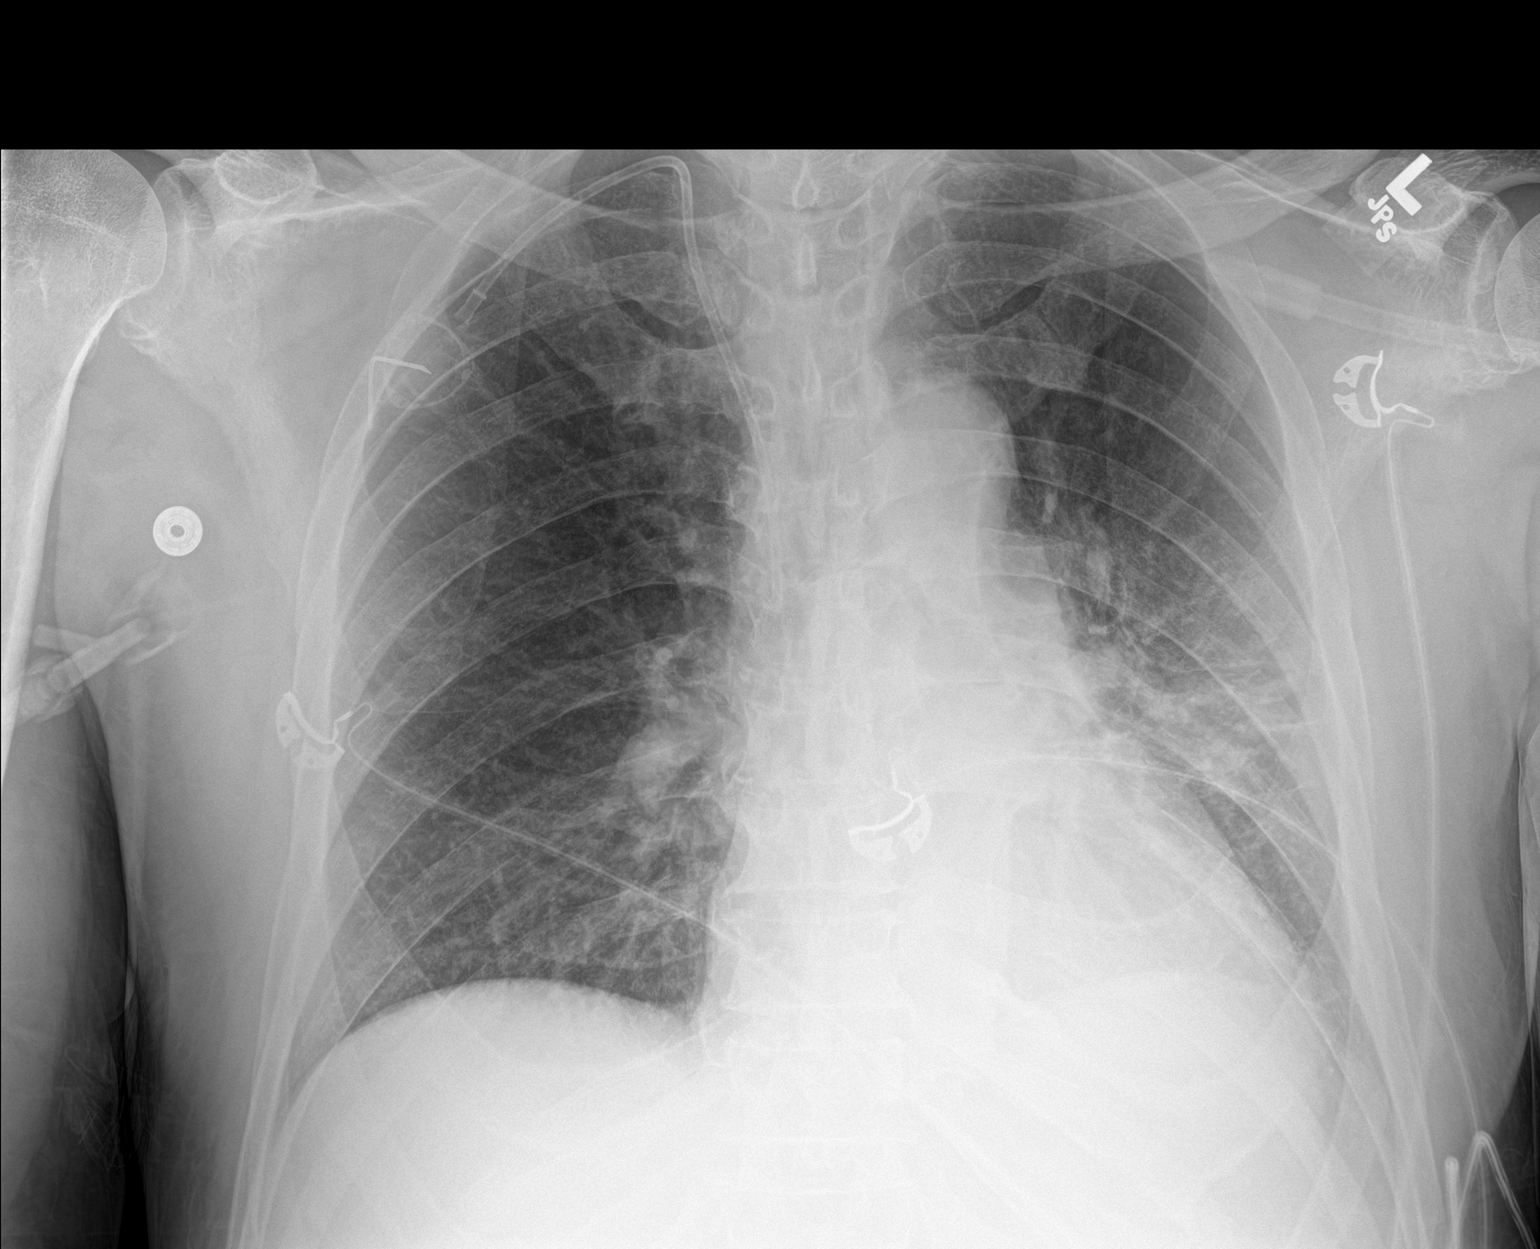

[1 of 1 positions shown; findings below may reference images not displayed]

FINDINGS: Superior segment left lower lobe or left upper lobe airspace disease
has increased since the prior exam. No significant right-sided
consolidation is present. Mild hilar prominence is noted. A right IJ
Port-A-Cath is stable. A left pleural effusion is suspected.
IMPRESSION: 1. Progressive superior segment left lower lobe or left upper lobe
airspace disease compatible with pneumonia.
2. Small left pleural effusion and basilar airspace disease, likely
atelectasis.

## 2018-07-02 IMAGING — CR DG CHEST 2V
2 series · 2 of 2 positions shown · non-contrast
Comparison: 05/18/2017

CLINICAL DATA: Small-cell lung cancer.

EXAM:
CHEST - 2 VIEW

[chest ap]
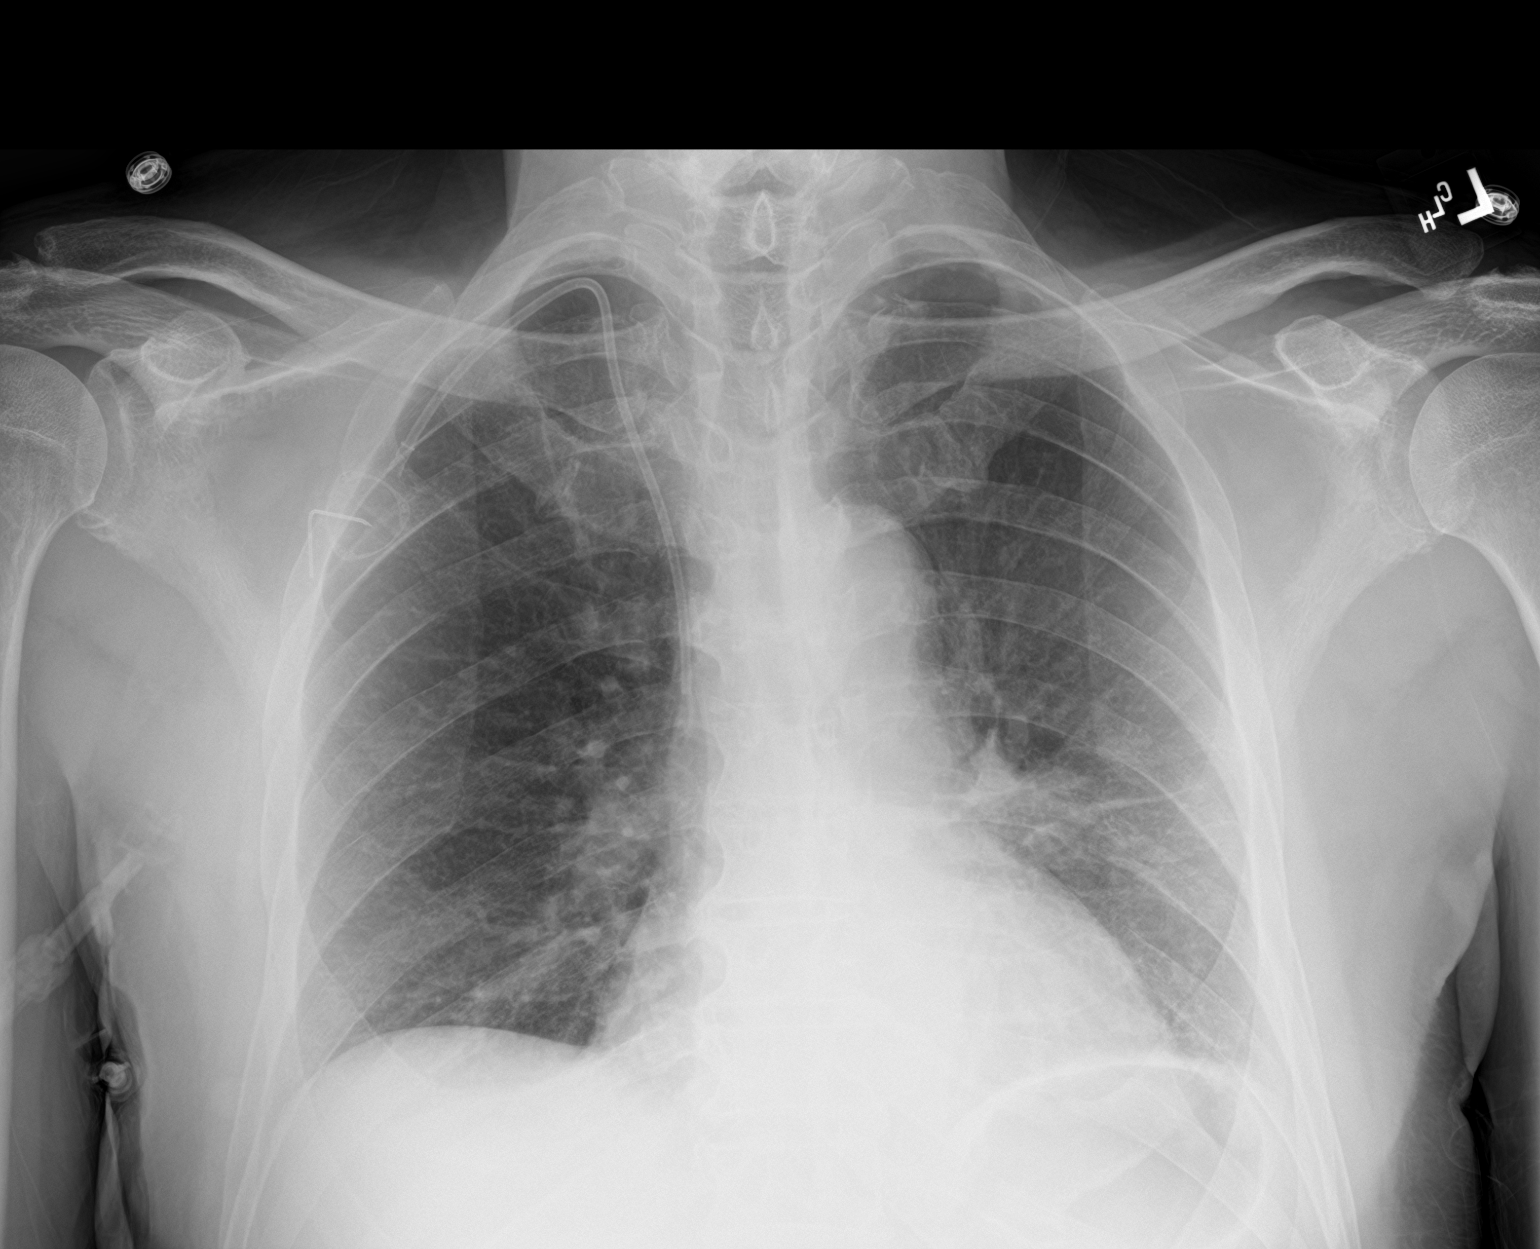

[chest lat]
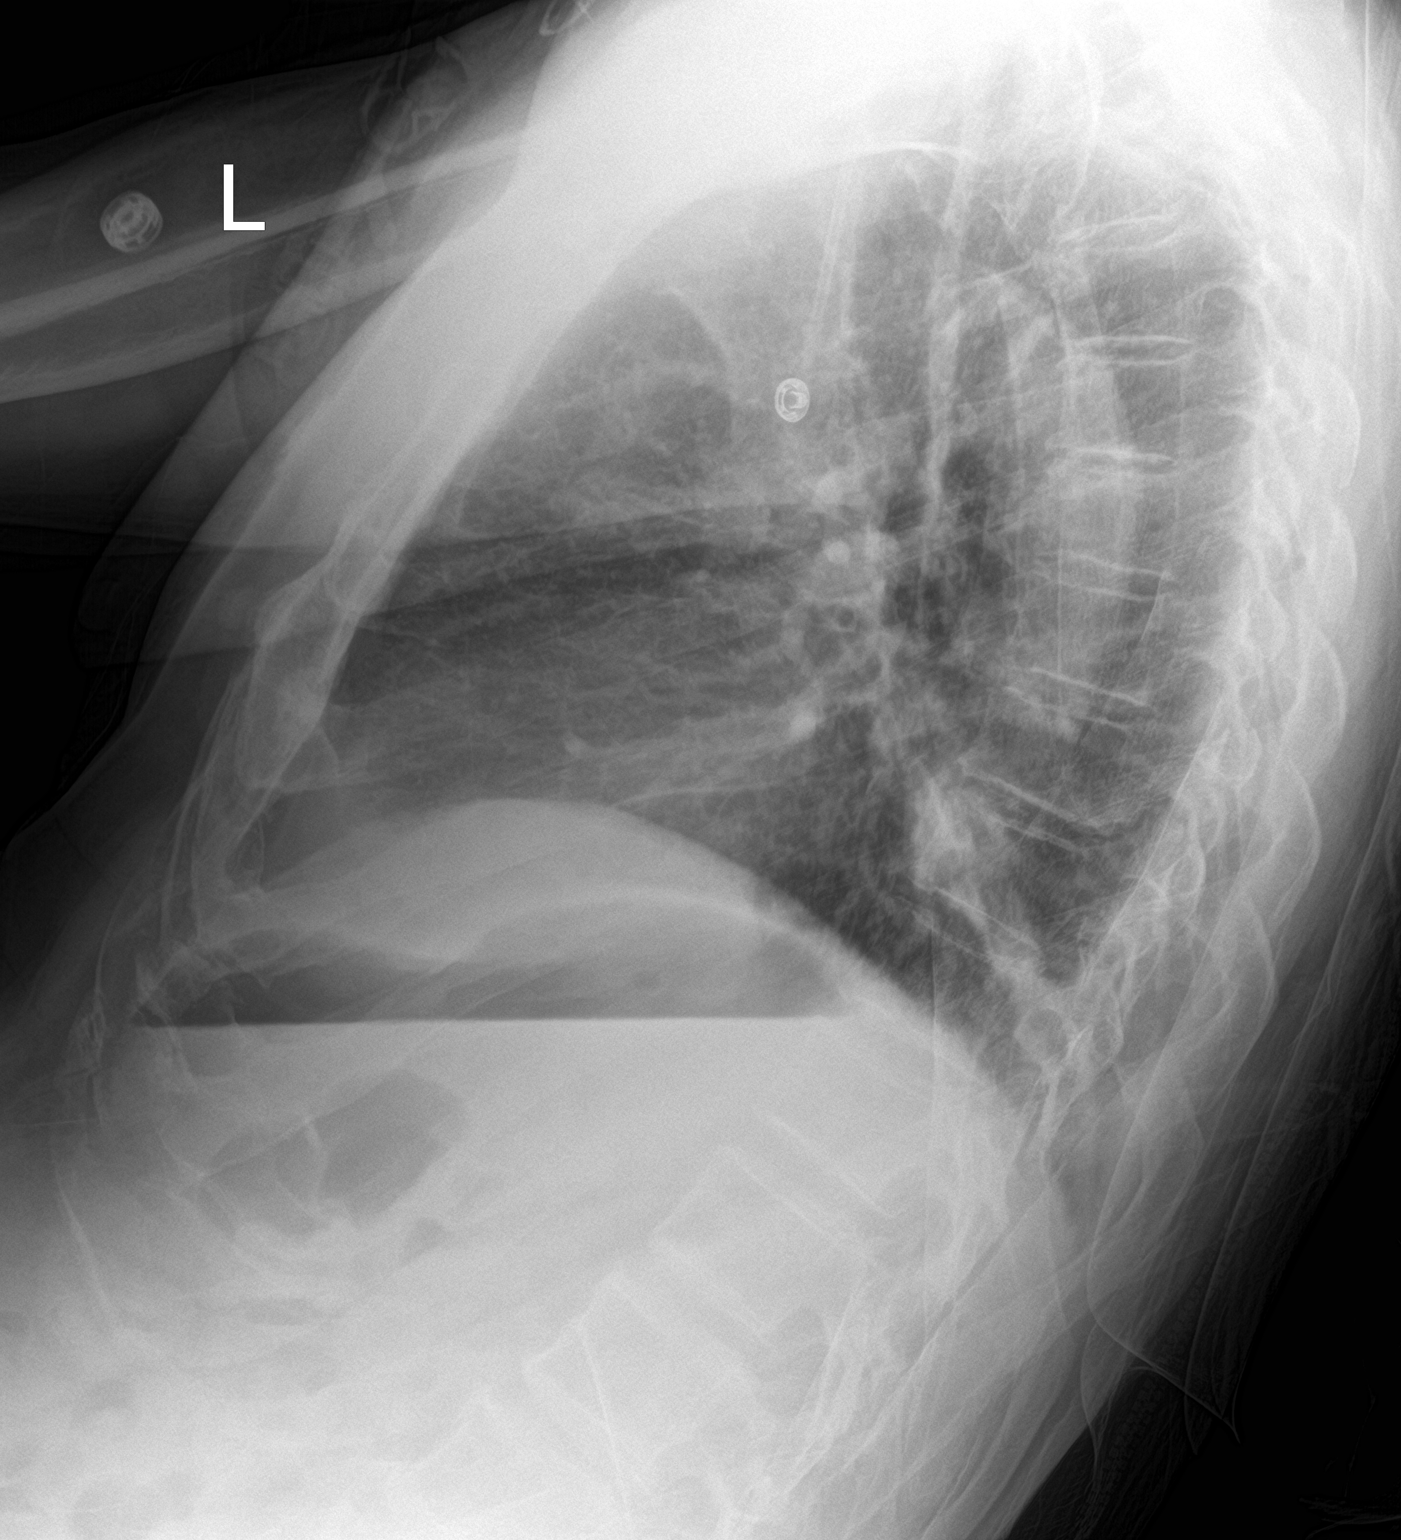

[2 of 2 positions shown; findings below may reference images not displayed]

FINDINGS: AP and lateral views of the chest obtained. Right lung clear.
Similar appearance left hilar and parahilar opacity. No pulmonary
edema or pleural effusion. Cardiopericardial silhouette is at upper
limits of normal for size. Right Port-A-Cath again noted. The
visualized bony structures of the thorax are intact.
IMPRESSION: No substantial change.

## 2018-07-25 ENCOUNTER — Other Ambulatory Visit: Payer: Self-pay | Admitting: Family Medicine

## 2018-07-25 ENCOUNTER — Other Ambulatory Visit: Payer: Self-pay | Admitting: Internal Medicine

## 2018-07-25 DIAGNOSIS — I6522 Occlusion and stenosis of left carotid artery: Secondary | ICD-10-CM

## 2018-07-25 DIAGNOSIS — B182 Chronic viral hepatitis C: Secondary | ICD-10-CM

## 2018-07-29 ENCOUNTER — Ambulatory Visit
Admission: RE | Admit: 2018-07-29 | Discharge: 2018-07-29 | Disposition: A | Discharge status: Home / Self Care | Payer: Medicare Other | Source: Ambulatory Visit | Attending: Family Medicine | Admitting: Family Medicine

## 2018-07-29 ENCOUNTER — Other Ambulatory Visit: Payer: Self-pay

## 2018-07-29 ENCOUNTER — Ambulatory Visit: Admission: RE | Admit: 2018-07-29 | Payer: Medicare Other | Source: Ambulatory Visit

## 2018-07-29 DIAGNOSIS — I6522 Occlusion and stenosis of left carotid artery: Secondary | ICD-10-CM

## 2018-07-29 DIAGNOSIS — B182 Chronic viral hepatitis C: Secondary | ICD-10-CM

## 2020-10-26 ENCOUNTER — Emergency Department
Admission: EM | Admit: 2020-10-26 | Discharge: 2020-10-26 | Disposition: A | Discharge status: Home / Self Care | Payer: No Typology Code available for payment source | Attending: Emergency Medicine | Admitting: Emergency Medicine

## 2020-10-26 DIAGNOSIS — Z7984 Long term (current) use of oral hypoglycemic drugs: Secondary | ICD-10-CM | POA: Diagnosis not present

## 2020-10-26 DIAGNOSIS — Z79899 Other long term (current) drug therapy: Secondary | ICD-10-CM | POA: Diagnosis not present

## 2020-10-26 DIAGNOSIS — J449 Chronic obstructive pulmonary disease, unspecified: Secondary | ICD-10-CM | POA: Insufficient documentation

## 2020-10-26 DIAGNOSIS — Z7982 Long term (current) use of aspirin: Secondary | ICD-10-CM | POA: Insufficient documentation

## 2020-10-26 DIAGNOSIS — T501X1A Poisoning by loop [high-ceiling] diuretics, accidental (unintentional), initial encounter: Secondary | ICD-10-CM | POA: Insufficient documentation

## 2020-10-26 DIAGNOSIS — I129 Hypertensive chronic kidney disease with stage 1 through stage 4 chronic kidney disease, or unspecified chronic kidney disease: Secondary | ICD-10-CM | POA: Insufficient documentation

## 2020-10-26 DIAGNOSIS — E1122 Type 2 diabetes mellitus with diabetic chronic kidney disease: Secondary | ICD-10-CM | POA: Insufficient documentation

## 2020-10-26 DIAGNOSIS — Z87891 Personal history of nicotine dependence: Secondary | ICD-10-CM | POA: Insufficient documentation

## 2020-10-26 DIAGNOSIS — Z794 Long term (current) use of insulin: Secondary | ICD-10-CM | POA: Diagnosis not present

## 2020-10-26 DIAGNOSIS — T50901A Poisoning by unspecified drugs, medicaments and biological substances, accidental (unintentional), initial encounter: Secondary | ICD-10-CM

## 2020-10-26 DIAGNOSIS — T478X1A Poisoning by other agents primarily affecting gastrointestinal system, accidental (unintentional), initial encounter: Secondary | ICD-10-CM | POA: Diagnosis not present

## 2020-10-26 DIAGNOSIS — Z85118 Personal history of other malignant neoplasm of bronchus and lung: Secondary | ICD-10-CM | POA: Diagnosis not present

## 2020-10-26 DIAGNOSIS — N189 Chronic kidney disease, unspecified: Secondary | ICD-10-CM | POA: Insufficient documentation

## 2020-10-26 DIAGNOSIS — F0390 Unspecified dementia without behavioral disturbance: Secondary | ICD-10-CM | POA: Diagnosis not present

## 2020-10-26 DIAGNOSIS — Z7902 Long term (current) use of antithrombotics/antiplatelets: Secondary | ICD-10-CM | POA: Insufficient documentation

## 2020-10-26 DIAGNOSIS — T447X1A Poisoning by beta-adrenoreceptor antagonists, accidental (unintentional), initial encounter: Secondary | ICD-10-CM | POA: Diagnosis present

## 2020-10-26 LAB — BASIC METABOLIC PANEL
Anion gap: 11 (ref 5–15)
BUN: 28 mg/dL — ABNORMAL HIGH (ref 8–23)
CO2: 25 mmol/L (ref 22–32)
Calcium: 9 mg/dL (ref 8.9–10.3)
Chloride: 98 mmol/L (ref 98–111)
Creatinine, Ser: 2.2 mg/dL — ABNORMAL HIGH (ref 0.61–1.24)
GFR, Estimated: 30 mL/min — ABNORMAL LOW (ref >60)
Glucose, Bld: 123 mg/dL — ABNORMAL HIGH (ref 70–99)
Potassium: 4.8 mmol/L (ref 3.5–5.1)
Sodium: 134 mmol/L — ABNORMAL LOW (ref 135–145)

## 2020-10-26 NOTE — ED Provider Notes (Signed)
[facility] - Inpatient Emergency Department Provider Note   ____________________________________________   I have reviewed the triage vital signs and the nursing notes.   HISTORY  Chief Complaint Ingestion (Pt presents to the ED after accidentally taking his wife's medicine around 1300. Pt has no symptoms at this time. Carvedilol, allopurinol, omeprazole, entresto, and lasix. )   History limited by: Dementia, History primarily obtained from wife  HPI Edwin Jones is a 80 y.o. male who presents to the emergency department today accompanied by wife because of concerns for medication misadventure.  At around 1:00 this afternoon the wife mistakenly gave the patient her medications.  She states that she has been watching since then.  He has not been having any abnormal behavior but was complaining of some cramping in his hands.  She has been trying to encourage him to drink fluids.  At the time my exam the patient has no complaints.    Records reviewed. Per medical record review patient has a history of DM, HLD, GERD.   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[place])    Nontraumatic acute hemorrhage of basal ganglia ([place]) 05/17/2017   Anterior cerebral circulation hemorrhagic infarction ([place]) 05/17/2017   Pneumonia    Cerebral edema (HCC)    Cough    Fever, unspecified    Orthostatic hypotension    Labile blood pressure    Acute blood loss anemia    Diabetes  mellitus type 2 in nonobese (HCC)    Labile blood glucose    CKD (chronic kidney disease) 05/11/2017   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   Idiopathic hypotension 05/09/2017   Stroke ([place]) 05/06/2017   Stroke (cerebrum) ([place]) 05/06/2017   Acute ischemic left MCA stroke ([place]) 05/06/2017   Cancer of upper lobe of left lung ([place]) 10/30/2016   Pure hypercholesterolemia 06/22/2014   GERD without esophagitis 06/22/2014   Benign essential hypertension 06/22/2014   Diabetes mellitus without complication ([place]) 81/44/8185    Past Surgical History:  Procedure Laterality Date   COLONOSCOPY     approximately 11 years ago   FLEXIBLE BRONCHOSCOPY N/A 11/05/2016   Procedure: FLEXIBLE BRONCHOSCOPY;  Surgeon: Wilhelmina Mcardle, MD;  Location: [facility] ORS;  Service: Pulmonary;  Laterality: N/A;   IR FLUORO GUIDE CV LINE RIGHT  05/06/2017   IR INTRAVSC STENT CERV CAROTID W/O EMB-PROT MOD SED INC ANGIO  05/06/2017   IR PERCUTANEOUS ART THROMBECTOMY/INFUSION INTRACRANIAL INC DIAG ANGIO  05/06/2017   IR US GUIDE VASC ACCESS LEFT  05/06/2017   IR US GUIDE VASC ACCESS RIGHT  05/06/2017   IR US GUIDE VASC ACCESS RIGHT  05/06/2017   PORTA CATH INSERTION N/A 11/23/2016   Procedure: Glori Luis Cath Insertion;  Surgeon: Algernon Huxley, MD;  Location: [place] [facility];  Service: Cardiovascular;  Laterality: N/A;   RADIOLOGY WITH ANESTHESIA N/A 05/06/2017   Procedure: IR  WITH ANESTHESIA;  Surgeon: Radiologist, Medication, MD;  Location: [place];  Service: Radiology;  Laterality: N/A;   TEE WITHOUT CARDIOVERSION N/A 05/10/2017   Procedure: TRANSESOPHAGEAL ECHOCARDIOGRAM (TEE);  Surgeon: Sanda Klein, MD;  Location: [facility] ENDOSCOPY;  Service: Cardiovascular;  Laterality: N/A;   TONSILLECTOMY      Prior to Admission medications   Medication Sig Start Date End Date Taking? Authorizing Provider  aspirin 81 MG chewable tablet Chew 4 tablets (324 mg total) by mouth daily. Patient taking differently: Chew 81 mg by mouth  daily.  06/15/17   Love, Ivan Anchors, PA-C  blood glucose meter kit and supplies KIT Dispense based on patient and insurance preference. Use up to four times daily as directed. ICD: E11.9 06/15/17   Love, Ivan Anchors, PA-C  citalopram (CELEXA) 10 MG tablet Take 10 mg by mouth daily.    [provider]  clopidogrel (PLAVIX) 75 MG tablet Take 1 tablet (75 mg total) by mouth daily. 06/14/17   Love, Ivan Anchors, PA-C  glimepiride (AMARYL) 4 MG tablet Take 1 tablet (4 mg total) by mouth daily with breakfast. 06/15/17   Love, Ivan Anchors, PA-C  insulin glargine (LANTUS) 100 UNIT/ML injection Inject 0.4 mLs (40 Units total) into the skin at bedtime. 06/14/17   Love, Ivan Anchors, PA-C  Insulin Syringes, Disposable, U-100 0.5 ML MISC 1 application by Does not apply route daily. 06/14/17   Love, Ivan Anchors, PA-C  lisinopril (PRINIVIL,ZESTRIL) 10 MG tablet Take 10 mg by mouth daily.    [provider]  nystatin (MYCOSTATIN) 100000 UNIT/ML suspension Use as directed 5 mLs (500,000 Units total) in the mouth or throat 4 (four) times daily. 06/14/17   Love, Ivan Anchors, PA-C  Omega-3 Fatty Acids (OMEGA-3 FISH OIL PO) Take by mouth.    [provider]  pantoprazole (PROTONIX) 40 MG tablet Take 1 tablet (40 mg total) by mouth daily. 06/15/17   Love, Ivan Anchors, PA-C  protein supplement shake (PREMIER PROTEIN) LIQD Take 325 mLs (11 oz total) by mouth 4 (four) times daily. 06/14/17   Love, Ivan Anchors, PA-C  senna-docusate (SENOKOT-S) 8.6-50 MG tablet Take 1 tablet by mouth at bedtime as needed for mild constipation. 05/11/17   Donzetta Starch, NP    Allergies Penicillin g, Shellfish-derived products, Statins, Erythromycin, Tamsulosin, Tuberculin ppd, and Iodinated diagnostic agents  Family History  Problem Relation Age of Onset   Skin cancer Mother    Ovarian cancer Sister        sister was diagnosed as a teenager   Throat cancer Brother 63       brother #1   Lung cancer Sister 45   Throat cancer Brother 66       brother #2    Skin cancer Sister     Social History Social History   Tobacco Use   Smoking status: Former    Packs/day: 1.00    Years: 50.00    Pack years: 50.00    Types: Cigarettes    Quit date: 04/07/2004    Years since quitting: 16.5   Smokeless tobacco: Never  Substance Use Topics   Alcohol use: No    Comment: history of alcohol use   Drug use: No    Review of Systems Constitutional: No fever/chills Eyes: No visual changes. ENT: No sore throat. Cardiovascular: Denies chest pain. Respiratory: Denies shortness of breath. Gastrointestinal: No abdominal pain.  No nausea, no vomiting.  No diarrhea.   Genitourinary: Negative for dysuria. Musculoskeletal: Negative for back pain.  Skin: Negative for rash. Neurological: Negative for headaches, focal weakness or numbness.  ____________________________________________   PHYSICAL EXAM:  VITAL SIGNS: ED Triage Vitals  Enc Vitals Group     BP 10/26/20 1534 (!) 150/75     Pulse Rate 10/26/20 1534 62     Resp 10/26/20 1534 18     Temp 10/26/20 1534 98.6 F (37 C)     Temp Source 10/26/20 1534 Oral     SpO2 10/26/20 1534 98 %     Weight 10/26/20 1535 210 lb (95.3 kg)     Height 10/26/20 1535 _0  (1.88 m)     Head Circumference --      Peak Flow --      Pain Score 10/26/20 1535 0     Pain Loc --      Pain Edu? --      Excl. in [place]? --      Constitutional: Awake and alert.  Eyes: Conjunctivae are normal.  ENT      Head: Normocephalic and atraumatic.      Nose: No congestion/rhinnorhea.      Mouth/Throat: Mucous membranes are moist.      Neck: No stridor. Hematological/Lymphatic/Immunilogical: No cervical lymphadenopathy. Cardiovascular: Normal rate, regular rhythm.  No murmurs, rubs, or gallops.  Respiratory: Normal respiratory effort without tachypnea nor retractions. Breath sounds are clear and equal bilaterally. No wheezes/rales/rhonchi. Gastrointestinal: Soft and non tender. No rebound. No guarding.  Genitourinary:  Deferred Musculoskeletal: Normal range of motion in all extremities. No lower extremity edema. Neurologic:   Moving all extremities.  Skin:  Skin is warm, dry and intact. No rash noted. Psychiatric: Mood and affect are normal. Speech and behavior are normal. Patient exhibits appropriate insight and judgment.  ____________________________________________    LABS (pertinent positives/negatives)  BMP na 134, k 4.8, glu 123, cr 2.20  ____________________________________________   EKG  None  ____________________________________________    RADIOLOGY  None   ____________________________________________   PROCEDURES  Procedures  ____________________________________________   INITIAL IMPRESSION / ASSESSMENT AND PLAN / ED COURSE  Pertinent labs & imaging results that were available during my care of the patient were reviewed by me and considered in my medical decision making (see chart for details).   Patient presented to the emergency department today accompanied by wife because of concerns for accidental ingestion of his wife's medication.  Medications were reviewed.  Wife did have concern for some low potassium level given that he had been complaining of some hand cramping.  This was checked and potassium was within normal limits.  Patient blood pressure good here in the emergency department.  At this time I do think is reasonable for patient be discharged home.  Discussed return precautions.   ____________________________________________   FINAL CLINICAL IMPRESSION(S) / ED DIAGNOSES  Final diagnoses:  Accidental medication error, initial encounter     Note: This dictation was prepared with Dragon dictation. Any transcriptional errors that result from this process are unintentional     Nance Pear, MD 10/26/20 1754

## 2020-10-26 NOTE — ED Notes (Signed)
Pt and wife provided crackers, ok per EDP. Wife states pt already ate potatoes and 2 salmon cakes to try to absorb the meds that he accidentally took. Wife also made pt drink 2 water bottles.

## 2020-10-26 NOTE — Discharge Instructions (Addendum)
Please seek medical attention for any high fevers, chest pain, shortness of breath, change in behavior, persistent vomiting, bloody stool or any other new or concerning symptoms.  

## 2020-10-26 NOTE — ED Triage Notes (Signed)
Pt comes with wife who states that he accidentally had a dose of all of HER morning meds today. Pt dementia at baseline but oriented to self and place.

## 2020-10-26 NOTE — ED Notes (Signed)
EDP at bedside  

## 2020-10-26 NOTE — ED Notes (Signed)
Pt urinated into urinal, wife assisted pt.

## 2022-06-10 ENCOUNTER — Ambulatory Visit (INDEPENDENT_AMBULATORY_CARE_PROVIDER_SITE_OTHER): Payer: Medicare Other | Admitting: Podiatry

## 2022-06-10 DIAGNOSIS — M79675 Pain in left toe(s): Secondary | ICD-10-CM

## 2022-06-10 DIAGNOSIS — E1151 Type 2 diabetes mellitus with diabetic peripheral angiopathy without gangrene: Secondary | ICD-10-CM | POA: Diagnosis not present

## 2022-06-10 DIAGNOSIS — M79674 Pain in right toe(s): Secondary | ICD-10-CM | POA: Diagnosis not present

## 2022-06-10 DIAGNOSIS — B351 Tinea unguium: Secondary | ICD-10-CM

## 2022-06-10 MED ORDER — DOXYCYCLINE HYCLATE 100 MG PO TABS
100.0000 mg | ORAL_TABLET | Freq: Two times a day (BID) | ORAL | 0 refills | Status: DC
Start: 2022-06-10 — End: 2022-07-23

## 2022-06-10 NOTE — Patient Instructions (Signed)

## 2022-06-15 ENCOUNTER — Encounter: Payer: Self-pay | Admitting: Podiatry

## 2022-06-15 NOTE — Progress Notes (Signed)
  Subjective:  Patient ID: Edwin Jones, male    DOB: 05-Aug-1940,  MRN: [id number]  Chief Complaint  Patient presents with   Diabetes    (np) Diabetic foot care-nail trim -patient is taking Plavix    82 y.o. male presents with the above complaint. History confirmed with patient.  He is here with family.  The nails are so thick and curved around they cannot trim them  Objective:  Physical Exam: warm, good capillary refill, no trophic changes or ulcerative lesions, nonpalpable DP and PT pulses, and abnormal sensory exam. Left Foot: dystrophic yellowed discolored nail plates with subungual debris Right Foot: dystrophic yellowed discolored nail plates with subungual debris and distal medial right hallux shows ulceration deep to the nail plate, no exposed bone, no signs of infection     Assessment:   1. Diabetes mellitus with peripheral angiopathy   2. Pain due to onychomycosis of toenails of both feet      Plan:  Patient was evaluated and treated and all questions answered.  Patient educated on diabetes. Discussed proper diabetic foot care and discussed risks and complications of disease. Educated patient in depth on reasons to return to the office immediately should he/she discover anything concerning or new on the feet. All questions answered. Discussed proper shoes as well.   Discussed the etiology and treatment options for the condition in detail with the patient. Educated patient on the topical and oral treatment options for mycotic nails. Recommended debridement of the nails today. Sharp and mechanical debridement performed of all painful and mycotic nails today. Nails debrided in length and thickness using a nail nipper to level of comfort. Discussed treatment options including appropriate shoe gear. Follow up as needed for painful nails.  He has an ulceration deep to the nail plate.  I placed him on doxycycline and recommended evaluation with noninvasive vascular testing.   Referral to vascular as needed pending results of this.  Hopefully should be able to heal uneventfully I recommended they applied Neosporin at home with a Band-Aid.  Worsening signs of infection were reviewed with family and he should return sooner if needed.  Will see him back in a few weeks to reevaluate the wound as well as in 3 months for at risk diabetic footcare regularly.  Return for at risk diabetic foot care.

## 2022-06-23 ENCOUNTER — Other Ambulatory Visit: Payer: Self-pay | Admitting: Podiatry

## 2022-06-23 DIAGNOSIS — E1151 Type 2 diabetes mellitus with diabetic peripheral angiopathy without gangrene: Secondary | ICD-10-CM

## 2022-06-29 ENCOUNTER — Ambulatory Visit: Payer: Medicare Other | Admitting: Podiatry

## 2022-07-15 ENCOUNTER — Encounter: Payer: Self-pay | Admitting: Podiatry

## 2022-07-15 ENCOUNTER — Telehealth: Payer: Self-pay | Admitting: Podiatry

## 2022-07-15 NOTE — Telephone Encounter (Signed)
Received a call from [facility] at [facility] in [place] she is asking about an authorization for pt to have procedure there tomorrow.  She has left message with nurse triage as well.Pt has medicare primary. She just needs someone to call her and tell her no Berkley Harvey is needed. If they do not hear back pt will not be able to have procedure tomorrow. Please advise and call Valery back asap.

## 2022-07-15 NOTE — Telephone Encounter (Signed)
Edwin Jones has addressed this

## 2022-07-16 ENCOUNTER — Ambulatory Visit: Payer: Medicare Other | Attending: Podiatry

## 2022-07-16 DIAGNOSIS — E1151 Type 2 diabetes mellitus with diabetic peripheral angiopathy without gangrene: Secondary | ICD-10-CM | POA: Insufficient documentation

## 2022-07-16 LAB — VAS US ABI WITH/WO TBI
Left ABI: 0.52
Right ABI: 0.8

## 2022-07-16 NOTE — Telephone Encounter (Signed)
Faxed note to [facility] yesterday per Dr Lilian Kapur that pt has traditional medicate and no authorization is needed.  Joya San called today and I told her that a note was faxed yesterday after 5 stating pt did not need prior authorization. She asked for it to be documented.

## 2022-07-20 ENCOUNTER — Telehealth: Payer: Self-pay | Admitting: Podiatry

## 2022-07-20 NOTE — Telephone Encounter (Signed)
Called pt to See if he can come in today 07/20/22 at 11:30. Unable to leave VM will try again later.

## 2022-07-20 NOTE — Telephone Encounter (Signed)
Called pt and spoke with his wife. And she stated she can't just come in with him today and that she needs at least a week notice to bring him to an appointment.   Please advise.

## 2022-07-21 ENCOUNTER — Telehealth: Payer: Self-pay | Admitting: Podiatry

## 2022-07-21 NOTE — Telephone Encounter (Signed)
Received call from [facility] at [facility] asking if we had gotten a authoization for pt for the appt with them.She mentioned [facility].  I checked [facility] and [facility] not [facility] and we have only billed his medicare. I told Kenney Houseman this and she said ok.

## 2022-07-23 ENCOUNTER — Ambulatory Visit (INDEPENDENT_AMBULATORY_CARE_PROVIDER_SITE_OTHER): Payer: Medicare Other | Admitting: Vascular Surgery

## 2022-07-23 DIAGNOSIS — I739 Peripheral vascular disease, unspecified: Secondary | ICD-10-CM

## 2022-07-23 DIAGNOSIS — I480 Paroxysmal atrial fibrillation: Secondary | ICD-10-CM | POA: Diagnosis not present

## 2022-07-23 DIAGNOSIS — I10 Essential (primary) hypertension: Secondary | ICD-10-CM

## 2022-07-23 DIAGNOSIS — I6523 Occlusion and stenosis of bilateral carotid arteries: Secondary | ICD-10-CM

## 2022-07-23 DIAGNOSIS — E119 Type 2 diabetes mellitus without complications: Secondary | ICD-10-CM

## 2022-07-23 DIAGNOSIS — E7800 Pure hypercholesterolemia, unspecified: Secondary | ICD-10-CM

## 2022-07-23 NOTE — Progress Notes (Signed)
MRN : [id number]  Edwin Jones is a 82 y.o. (Nov 04, 1940) male who presents with chief complaint of check circulation.  History of Present Illness:    The patient is seen for evaluation of painful lower extremities and diminished pulses. Patient notes the pain is always associated with activity and is very consistent day today. Typically, the pain occurs at less than one block, progress is as activity continues to the point that the patient must stop walking. Resting including standing still for several minutes allows the patient to walk a similar distance before being forced to stop again. Uneven terrain and inclines shorten the distance. The pain has been progressive over the past several years. The patient denies any abrupt changes in claudication symptoms.  Patient has been followed by podiatry for a wound on the tip of his great toe that has been slow to heal.  His wife reports that at 1 point there was pus coming from the area.  The patient denies rest pain or dangling of an extremity off the side of the bed during the night for relief. No prior lower extremity interventions or surgeries.  No history of back problems or DJD of the lumbar sacral spine.   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  The patient's blood pressure has been stable and relatively well controlled. The patient denies history of DVT, PE or superficial thrombophlebitis. The patient denies recent episodes of angina or shortness of breath.   No outpatient medications have been marked as taking for the 07/23/22 encounter (Appointment) with Gilda Crease, Latina Craver, MD.    Past Medical History:  Diagnosis Date   Cancer [facility])    COPD (chronic obstructive pulmonary  disease) (HCC)    Diabetes mellitus without complication (HCC)    GERD without esophagitis    Hepatitis C virus    history of hep C treated with Harvoni   Hypercholesterolemia    Hypertension    Seasonal allergies     Past Surgical History:  Procedure Laterality Date   COLONOSCOPY     approximately 11 years ago   FLEXIBLE BRONCHOSCOPY N/A 11/05/2016   Procedure: FLEXIBLE BRONCHOSCOPY;  Surgeon: Merwyn Katos, MD;  Location: [facility] ORS;  Service: Pulmonary;  Laterality: N/A;   IR FLUORO GUIDE CV LINE RIGHT  05/06/2017   IR INTRAVSC STENT CERV CAROTID W/O EMB-PROT MOD SED INC ANGIO  05/06/2017   IR PERCUTANEOUS ART THROMBECTOMY/INFUSION INTRACRANIAL INC DIAG ANGIO  05/06/2017   IR US GUIDE VASC ACCESS LEFT  05/06/2017   IR US GUIDE VASC ACCESS RIGHT  05/06/2017   IR US GUIDE VASC ACCESS RIGHT  05/06/2017   PORTA CATH INSERTION N/A 11/23/2016   Procedure: Shelda Pal Cath Insertion;  Surgeon: Annice Needy, MD;  Location: [facility] INVASIVE CV LAB;  Service: Cardiovascular;  Laterality: N/A;   RADIOLOGY WITH ANESTHESIA N/A 05/06/2017   Procedure: IR WITH ANESTHESIA;  Surgeon: Radiologist, Medication, MD;  Location:  [facility] OR;  Service: Radiology;  Laterality: N/A;   TEE WITHOUT CARDIOVERSION N/A 05/10/2017   Procedure: TRANSESOPHAGEAL ECHOCARDIOGRAM (TEE);  Surgeon: Thurmon Fair, MD;  Location: [facility] ENDOSCOPY;  Service: Cardiovascular;  Laterality: N/A;   TONSILLECTOMY      Social History Social History   Tobacco Use   Smoking status: Former    Packs/day: 1.00    Years: 50.00    Additional pack years: 0.00    Total pack years: 50.00    Types: Cigarettes    Quit date: 04/07/2004    Years since quitting: 18.3   Smokeless tobacco: Never  Substance Use Topics   Alcohol use: No    Comment: history of alcohol use   Drug use: No    Family History Family History  Problem Relation Age of Onset   Skin cancer Mother    Ovarian cancer Sister        sister was diagnosed as a teenager   Throat cancer  Brother 41       brother #1   Lung cancer Sister 50   Throat cancer Brother 55       brother #2   Skin cancer Sister     Allergies  Allergen Reactions   Penicillin G Anaphylaxis    Other reaction(s): Other (See Comments) Couldn't breath among other things Has patient had a PCN reaction causing immediate rash, facial/tongue/throat swelling, SOB or lightheadedness with hypotension: Yes Has patient had a PCN reaction causing severe rash involving mucus membranes or skin necrosis: No Has patient had a PCN reaction that required hospitalization: Yes Has patient had a PCN reaction occurring within the last 10 years: No If all of the above answers are "NO", then may proceed with Ce   Shellfish-Derived Products Anaphylaxis   Statins     Other reaction(s): Other (See Comments) Muscle spasms - can't walk - couldn't turn over in bed   Erythromycin Itching    All mycins   Tamsulosin     Other reaction(s): Dizziness   Tuberculin Ppd     Other reaction(s): Other (See Comments) False test    Iodinated Contrast Media Hives     REVIEW OF SYSTEMS (Negative unless checked)  Constitutional: [] Weight loss  [] Fever  [] Chills Cardiac: [] Chest pain   [] Chest pressure   [] Palpitations   [] Shortness of breath when laying flat   [] Shortness of breath with exertion. Vascular:  [x] Pain in legs with walking   [] Pain in legs at rest  [] History of DVT   [] Phlebitis   [] Swelling in legs   [] Varicose veins   [] Non-healing ulcers Pulmonary:   [] Uses home oxygen   [] Productive cough   [] Hemoptysis   [] Wheeze  [] COPD   [] Asthma Neurologic:  [] Dizziness   [] Seizures   [x] History of stroke   [] History of TIA  [] Aphasia   [] Vissual changes   [x] Weakness or numbness in arm   [x] Weakness or numbness in leg Musculoskeletal:   [] Joint swelling   [] Joint pain   [] Low back pain Hematologic:  [] Easy bruising  [] Easy bleeding   [] Hypercoagulable state   [] Anemic Gastrointestinal:  [] Diarrhea   [] Vomiting   [] Gastroesophageal reflux/heartburn   [] Difficulty swallowing. Genitourinary:  [] Chronic kidney disease   [] Difficult urination  [] Frequent urination   [] Blood in urine Skin:  [] Rashes   [] Ulcers  Psychological:  [] History of anxiety   []  History of major depression.  Physical Examination  There were no vitals filed for this visit. There is no height or weight on file to  calculate BMI. Gen: WD/WN, NAD seen in a wheelchair Head: [place]/AT, No temporalis wasting.  Ear/Nose/Throat: Hearing grossly intact, nares w/o erythema or drainage Eyes: PER, EOMI, sclera nonicteric.  Neck: Supple, no masses.  No bruit or JVD.  Pulmonary:  Good air movement, no audible wheezing, no use of accessory muscles.  Cardiac: RRR, normal S1, S2, no Murmurs. Vascular:  mild trophic changes, no open wounds Vessel Right Left  Radial Palpable Palpable  PT Not Palpable Not Palpable  DP Not Palpable Not Palpable  Gastrointestinal: soft, non-distended. No guarding/no peritoneal signs.  Musculoskeletal: M/S 5/5 throughout left side right side has notable weakness.  Mild to moderate atrophy of the right visible deformity.  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 Pain and light touch intact in extremities.  Symmetrical.  Motor exam as listed above. Psychiatric: Judgment intact, Mood & affect appropriate for pt's clinical situation. Dermatologic: No rashes or ulcers noted.  No changes consistent with cellulitis.   CBC Lab Results  Component Value Date   WBC 5.0 05/21/2017   HGB 11.0 (L) 05/21/2017   HCT 34.2 (L) 05/21/2017   MCV 95.3 05/21/2017   PLT 310 05/21/2017    BMET    Component Value Date/Time   NA 134 (L) 10/26/2020 1641   K 4.8 10/26/2020 1641   CL 98 10/26/2020 1641   CO2 25 10/26/2020 1641   GLUCOSE 123 (H) 10/26/2020 1641   BUN 28 (H) 10/26/2020 1641   CREATININE 2.20 (H) 10/26/2020 1641   CALCIUM 9.0 10/26/2020 1641   GFRNONAA 30 (L) 10/26/2020 1641   GFRAA 31 (L) 06/14/2017 1224   CrCl cannot be  calculated (Patient's most recent lab result is older than the maximum 21 days allowed.).  COAG Lab Results  Component Value Date   INR 0.92 05/06/2017   INR 0.92 10/30/2016    Radiology VAS [place] ABI WITH/WO TBI  Result Date: 07/16/2022  LOWER EXTREMITY DOPPLER STUDY Patient Name:  Edwin Jones  Date of Exam:   07/16/2022 Medical Rec #: [id number]      Accession #:    [id number] Date of Birth: 1940-07-21     Patient Gender: M Patient Age:   52 years Exam Location:  [place] Procedure:      VAS [place] ABI WITH/WO TBI Referring Phys: --------------------------------------------------------------------------------  High Risk Factors: Hypertension, hyperlipidemia, Diabetes, past history of                    smoking, prior MI, coronary artery disease, prior CVA.  Performing Technologist: Rolland Porter  Examination Guidelines: A complete evaluation includes at minimum, Doppler waveform signals and systolic blood pressure reading at the level of bilateral brachial, anterior tibial, and posterior tibial arteries, when vessel segments are accessible. Bilateral testing is considered an integral part of a complete examination. Photoelectric Plethysmograph (PPG) waveforms and toe systolic pressure readings are included as required and additional duplex testing as needed. Limited examinations for reoccurring indications may be performed as noted.  ABI Findings: +---------+------------------+-----+----------+--------+ Right    Rt Pressure (mmHg)IndexWaveform  Comment  +---------+------------------+-----+----------+--------+ Brachial 105                                       +---------+------------------+-----+----------+--------+ PTA      86                0.80 monophasic         +---------+------------------+-----+----------+--------+ DP  25                0.23 monophasic         +---------+------------------+-----+----------+--------+ Great Toe23                0.21 Abnormal            +---------+------------------+-----+----------+--------+ +---------+------------------+-----+----------+-------+ Left     Lt Pressure (mmHg)IndexWaveform  Comment +---------+------------------+-----+----------+-------+ Brachial 107                                      +---------+------------------+-----+----------+-------+ PTA      0                 0.00 absent            +---------+------------------+-----+----------+-------+ DP       56                0.52 monophasic        +---------+------------------+-----+----------+-------+ Great Toe0                 0.00 Abnormal          +---------+------------------+-----+----------+-------+ +-------+-----------+-----------+------------+------------+ ABI/TBIToday's ABIToday's TBIPrevious ABIPrevious TBI +-------+-----------+-----------+------------+------------+ Right  0.8        0.21                                +-------+-----------+-----------+------------+------------+ Left   0.52       0.00                                +-------+-----------+-----------+------------+------------+  Summary: Right: Resting right ankle-brachial index indicates mild right lower extremity arterial disease. The right toe-brachial index is abnormal. Left: Resting left ankle-brachial index indicates moderate left lower extremity arterial disease. The left toe-brachial index is abnormal. *See table(s) above for measurements and observations.  Electronically signed by Nanetta Batty MD on 07/16/2022 at 2:53:46 PM.    Final    VAS [place] LOWER EXTREMITY ARTERIAL DUPLEX  Result Date: 07/16/2022 LOWER EXTREMITY ARTERIAL DUPLEX STUDY Patient Name:  Edwin Jones  Date of Exam:   07/16/2022 Medical Rec #: [id number]      Accession #:    [id number] Date of Birth: [birth year]     Patient Gender: M Patient Age:   68 years Exam Location:  [place] Procedure:      VAS [place] LOWER EXTREMITY ARTERIAL DUPLEX Referring Phys: ADAM MCDONALD  --------------------------------------------------------------------------------  High Risk Factors: None, hypertension, hyperlipidemia, Diabetes, past history of                    smoking, prior MI, coronary artery disease, prior CVA.  Current ABI: R 0.8, L 0.52 Performing Technologist: Rolland Porter  Examination Guidelines: A complete evaluation includes B-mode imaging, spectral Doppler, color Doppler, and power Doppler as needed of all accessible portions of each vessel. Bilateral testing is considered an integral part of a complete examination. Limited examinations for reoccurring indications may be performed as noted.  +----------+--------+-----+--------+----------+--------+ RIGHT     PSV cm/sRatioStenosisWaveform  Comments +----------+--------+-----+--------+----------+--------+ CFA Mid   118                  biphasic           +----------+--------+-----+--------+----------+--------+ DFA       153  biphasic           +----------+--------+-----+--------+----------+--------+ SFA Prox  63                   biphasic           +----------+--------+-----+--------+----------+--------+ SFA Mid   82                   biphasic           +----------+--------+-----+--------+----------+--------+ SFA Distal64                   biphasic           +----------+--------+-----+--------+----------+--------+ POP Mid   69                   monophasic         +----------+--------+-----+--------+----------+--------+ POP Distal0            occludedAbsent             +----------+--------+-----+--------+----------+--------+ ATA Mid   0                    Absent             +----------+--------+-----+--------+----------+--------+ PTA Mid   55                   monophasic         +----------+--------+-----+--------+----------+--------+ PERO Mid  21                   monophasic         +----------+--------+-----+--------+----------+--------+   +----------+--------+-----+--------+----------+--------+ LEFT      PSV cm/sRatioStenosisWaveform  Comments +----------+--------+-----+--------+----------+--------+ CFA Mid   108                  biphasic           +----------+--------+-----+--------+----------+--------+ DFA       113                  biphasic           +----------+--------+-----+--------+----------+--------+ SFA Prox  0            occludedAbsent             +----------+--------+-----+--------+----------+--------+ SFA Mid                occludedAbsent             +----------+--------+-----+--------+----------+--------+ SFA Distal51                   monophasic         +----------+--------+-----+--------+----------+--------+ POP Mid   34                   monophasic         +----------+--------+-----+--------+----------+--------+ POP Distal30                   monophasic         +----------+--------+-----+--------+----------+--------+ ATA Mid   22                   monophasic         +----------+--------+-----+--------+----------+--------+ PTA Mid   32                   monophasic         +----------+--------+-----+--------+----------+--------+ PERO Mid  0            occludedAbsent             +----------+--------+-----+--------+----------+--------+  Summary: Right: Total occlusion noted in the superficial femoral artery and/or popliteal artery. Left: Total occlusion noted in the superficial femoral artery and/or popliteal artery.  See table(s) above for measurements and observations. Electronically signed by Nanetta Batty MD on 07/16/2022 at 2:53:01 PM.    Final      Assessment/Plan 1. PAD (peripheral artery disease) (HCC)  Recommend:  The patient has evidence of atherosclerosis of the lower extremities with claudication.  The patient does not voice lifestyle limiting changes at this point in time.  Noninvasive studies do not suggest clinically significant  change.  No invasive studies, angiography or surgery at this time The patient should continue walking and begin a more formal exercise program.  The patient should continue antiplatelet therapy and aggressive treatment of the lipid abnormalities  No changes in the patient's medications at this time  Continued surveillance is indicated as atherosclerosis is likely to progress with time.    The patient will continue follow up with noninvasive studies as ordered.  - VAS [place] ABI WITH/WO TBI; Future  2. Bilateral carotid artery stenosis Recommend:  The patient is s/p left carotid stent at an outside institution  Continue antiplatelet therapy as prescribed Continue management of CAD, HTN and Hyperlipidemia Healthy heart diet,  encouraged exercise at least 4 times per week  The patient's NIHSS score is as follows: 20 Mild: 1 - 5 Mild to Moderately Severe: 5 - 14 Severe: 15 - 24 Very Severe: >25  Follow up in 6 months with duplex ultrasound and physical exam based on the patient's carotid intervention.  3. Paroxysmal A-fib (HCC) Continue antiarrhythmia medications as already ordered, these medications have been reviewed and there are no changes at this time.  Continue anticoagulation as ordered by Cardiology Service  4. Benign essential hypertension Continue antihypertensive medications as already ordered, these medications have been reviewed and there are no changes at this time.  5. Diabetes mellitus type 2 in nonobese [facility]) Continue hypoglycemic medications as already ordered, these medications have been reviewed and there are no changes at this time.  Hgb A1C to be monitored as already arranged by primary service  6. Pure hypercholesterolemia Continue statin as ordered and reviewed, no changes at this time    Levora Dredge, MD  07/23/2022 8:39 AM

## 2022-07-26 ENCOUNTER — Encounter (INDEPENDENT_AMBULATORY_CARE_PROVIDER_SITE_OTHER): Payer: Self-pay | Admitting: Vascular Surgery

## 2022-07-26 DIAGNOSIS — I6529 Occlusion and stenosis of unspecified carotid artery: Secondary | ICD-10-CM | POA: Insufficient documentation

## 2022-07-29 ENCOUNTER — Ambulatory Visit: Payer: Medicare Other | Admitting: Podiatry

## 2022-09-07 ENCOUNTER — Ambulatory Visit: Payer: Medicare Other | Admitting: Podiatry

## 2022-10-15 ENCOUNTER — Telehealth (INDEPENDENT_AMBULATORY_CARE_PROVIDER_SITE_OTHER): Payer: Self-pay

## 2022-10-16 NOTE — Telephone Encounter (Signed)
I had the appt canceled and pt's wife will cal to reschedule when she is ready to bring him

## 2022-10-19 ENCOUNTER — Ambulatory Visit (INDEPENDENT_AMBULATORY_CARE_PROVIDER_SITE_OTHER): Payer: Medicare Other | Admitting: Vascular Surgery

## 2022-10-19 ENCOUNTER — Encounter (INDEPENDENT_AMBULATORY_CARE_PROVIDER_SITE_OTHER): Payer: Medicare Other

## 2023-01-14 ENCOUNTER — Encounter: Payer: Self-pay | Admitting: Podiatry

## 2023-01-14 ENCOUNTER — Ambulatory Visit (INDEPENDENT_AMBULATORY_CARE_PROVIDER_SITE_OTHER): Payer: Medicare Other | Admitting: Podiatry

## 2023-01-14 DIAGNOSIS — E119 Type 2 diabetes mellitus without complications: Secondary | ICD-10-CM | POA: Diagnosis not present

## 2023-01-14 DIAGNOSIS — E1151 Type 2 diabetes mellitus with diabetic peripheral angiopathy without gangrene: Secondary | ICD-10-CM

## 2023-01-14 NOTE — Progress Notes (Signed)
Subjective: Edwin Jones presents today referred by Leanna Sato, MD for diabetic foot evaluation.  Patient relates many year history of diabetes.  Patient denies any history of foot wounds.  Patient denies any history of numbness, tingling, burning, pins/needles sensations.  Past Medical History:  Diagnosis Date   Cancer (HCC)    COPD (chronic obstructive pulmonary disease) (HCC)    Diabetes mellitus without complication (HCC)    GERD without esophagitis    Hepatitis C virus    history of hep C treated with Harvoni   Hypercholesterolemia    Hypertension    Seasonal allergies     Patient Active Problem List   Diagnosis Date Noted   Carotid stenosis 07/26/2022   PAD (peripheral artery disease) (HCC) 07/23/2022   Paroxysmal A-fib (HCC) 01/14/2018   SOB (shortness of breath) on exertion    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)    Aphasia as late effect of stroke    Small cell lung cancer, left upper lobe (HCC)    Nontraumatic acute hemorrhage of basal ganglia (HCC) 05/17/2017   Anterior cerebral circulation hemorrhagic infarction (HCC) 05/17/2017   Pneumonia    Cerebral edema (HCC)    Cough    Fever, unspecified    Orthostatic hypotension    Labile blood pressure    Acute blood loss anemia    Diabetes mellitus type 2 in nonobese (HCC)    Labile blood glucose    CKD (chronic kidney disease) 05/11/2017   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   Idiopathic hypotension 05/09/2017   Stroke (HCC) 05/06/2017   Stroke (cerebrum) (HCC) 05/06/2017   Acute ischemic left MCA stroke (HCC) 05/06/2017   Cancer of upper lobe of left lung (HCC) 10/30/2016   Pure hypercholesterolemia 06/22/2014   GERD without esophagitis 06/22/2014   Benign essential hypertension 06/22/2014   Diabetes mellitus without complication (HCC) 06/22/2014   Benign non-nodular prostatic hyperplasia with lower urinary tract symptoms 06/22/2014    Past Surgical History:   Procedure Laterality Date   COLONOSCOPY     approximately 11 years ago   FLEXIBLE BRONCHOSCOPY N/A 11/05/2016   Procedure: FLEXIBLE BRONCHOSCOPY;  Surgeon: Merwyn Katos, MD;  Location: [facility] ORS;  Service: Pulmonary;  Laterality: N/A;   IR FLUORO GUIDE CV LINE RIGHT  05/06/2017   IR INTRAVSC STENT CERV CAROTID W/O EMB-PROT MOD SED INC ANGIO  05/06/2017   IR PERCUTANEOUS ART THROMBECTOMY/INFUSION INTRACRANIAL INC DIAG ANGIO  05/06/2017   IR US GUIDE VASC ACCESS LEFT  05/06/2017   IR US GUIDE VASC ACCESS RIGHT  05/06/2017   IR US GUIDE VASC ACCESS RIGHT  05/06/2017   PORTA CATH INSERTION N/A 11/23/2016   Procedure: Shelda Pal Cath Insertion;  Surgeon: Annice Needy, MD;  Location: [facility] INVASIVE CV LAB;  Service: Cardiovascular;  Laterality: N/A;   RADIOLOGY WITH ANESTHESIA N/A 05/06/2017   Procedure: IR WITH ANESTHESIA;  Surgeon: Radiologist, Medication, MD;  Location: [facility];  Service: Radiology;  Laterality: N/A;   TEE WITHOUT CARDIOVERSION N/A 05/10/2017   Procedure: TRANSESOPHAGEAL ECHOCARDIOGRAM (TEE);  Surgeon: Thurmon Fair, MD;  Location: [facility] ENDOSCOPY;  Service: Cardiovascular;  Laterality: N/A;   TONSILLECTOMY      Current Outpatient Medications on File Prior to Visit  Medication Sig Dispense Refill   Apple Cider Vinegar 188 MG CAPS Take by mouth.     aspirin 81 MG chewable tablet Chew 4 tablets (324 mg total) by mouth daily. (Patient taking differently: Chew 81 mg by mouth  daily.)     blood glucose meter kit and supplies KIT Dispense based on patient and insurance preference. Use up to four times daily as directed. ICD: E11.9 1 each 0   citalopram (CELEXA) 10 MG tablet Take 10 mg by mouth daily.     colchicine 0.6 MG tablet Take by mouth.     Garlic 2 MG CAPS Take by mouth.     glimepiride (AMARYL) 4 MG tablet Take 1 tablet (4 mg total) by mouth daily with breakfast. (Patient taking differently: Take 2 mg by mouth 2 (two) times daily.) 30 tablet 0   Omega-3 Fatty Acids (OMEGA-3 FISH OIL PO)  Take by mouth.     pantoprazole (PROTONIX) 40 MG tablet Take 1 tablet (40 mg total) by mouth daily. 30 tablet 0   protein supplement shake (PREMIER PROTEIN) LIQD Take 325 mLs (11 oz total) by mouth 4 (four) times daily.  0   senna-docusate (SENOKOT-S) 8.6-50 MG tablet Take 1 tablet by mouth at bedtime as needed for mild constipation.     No current facility-administered medications on file prior to visit.     Allergies  Allergen Reactions   Penicillin G Anaphylaxis    Other reaction(s): Other (See Comments) Couldn't breath among other things Has patient had a PCN reaction causing immediate rash, facial/tongue/throat swelling, SOB or lightheadedness with hypotension: Yes Has patient had a PCN reaction causing severe rash involving mucus membranes or skin necrosis: No Has patient had a PCN reaction that required hospitalization: Yes Has patient had a PCN reaction occurring within the last 10 years: No If all of the above answers are "NO", then may proceed with Ce   Shellfish-Derived Products Anaphylaxis   Statins     Other reaction(s): Other (See Comments) Muscle spasms - can't walk - couldn't turn over in bed   Erythromycin Itching    All mycins   Tamsulosin     Other reaction(s): Dizziness   Tuberculin Ppd     Other reaction(s): Other (See Comments) False test    Iodinated Contrast Media Hives    Social History   Occupational History   Not on file  Tobacco Use   Smoking status: Former    Current packs/day: 0.00    Average packs/day: 1 pack/day for 50.0 years (50.0 ttl pk-yrs)    Types: Cigarettes    Start date: 04/07/1954    Quit date: 04/07/2004    Years since quitting: 18.7   Smokeless tobacco: Never  Vaping Use   Vaping status: Not on file  Substance and Sexual Activity   Alcohol use: No    Comment: history of alcohol use   Drug use: No   Sexual activity: Yes    Family History  Problem Relation Age of Onset   Skin cancer Mother    Ovarian cancer Sister         sister was diagnosed as a teenager   Throat cancer Brother 9       brother #1   Lung cancer Sister 60   Throat cancer Brother 21       brother #2   Skin cancer Sister      There is no immunization history on file for this patient.  Review of systems: Positive Findings in bold print.  Constitutional:  chills, fatigue, fever, sweats, weight change Communication: Nurse, learning disability, sign Presenter, broadcasting, hand writing, iPad/Android device Head: headaches, head injury Eyes: changes in vision, eye pain, glaucoma, cataracts, macular degeneration, diplopia, glare,  light sensitivity, eyeglasses or  contacts, blindness Ears nose mouth throat: hearing impaired, hearing aids,  ringing in ears, deaf, sign language,  vertigo, nosebleeds,  rhinitis,  cold sores, snoring, swollen glands Cardiovascular: HTN, edema, arrhythmia, pacemaker in place, defibrillator in place, chest pain/tightness, chronic anticoagulation, blood clot, heart failure, MI Peripheral Vascular: leg cramps, varicose veins, blood clots, lymphedema, varicosities Respiratory:  asthma, difficulty breathing, denies congestion, SOB, wheezing, cough, emphysema Gastrointestinal: change in appetite or weight, abdominal pain, constipation, diarrhea, nausea, vomiting, vomiting blood, change in bowel habits, abdominal pain, jaundice, rectal bleeding, hemorrhoids, GERD Genitourinary:  nocturia,  pain on urination, polyuria,  blood in urine, Foley catheter, urinary urgency, ESRD on hemodialysis Musculoskeletal: amputation, cramping, stiff joints, painful joints, decreased joint motion, fractures, OA, gout, hemiplegia, paraplegia, uses cane, wheelchair bound, uses walker, uses rollator Skin: +changes in toenails, color change, dryness, itching, mole changes,  rash, wound(s) Neurological: headaches, numbness in feet, paresthesias in feet, burning in feet, fainting,  seizures, change in speech, migraines, memory problems/poor historian, cerebral palsy,  weakness, paralysis, CVA, TIA Endocrine: diabetes, hypothyroidism, hyperthyroidism,  goiter, dry mouth, flushing, heat intolerance, cold intolerance,  excessive thirst, denies polyuria,  nocturia Hematological:  easy bleeding, excessive bleeding, easy bruising, enlarged lymph nodes, on long term blood thinner, history of past transusions Allergy/immunological:  hives, eczema, frequent infections, multiple drug allergies, seasonal allergies, transplant recipient, multiple food allergies Psychiatric:  anxiety, depression, mood disorder, suicidal ideations, hallucinations, insomnia  Objective: There were no vitals filed for this visit. Vascular Examination: Capillary refill time less than 3 seconds x 10 digits.  Dorsalis pedis pulses faintly palpable 2 out of 4  Posterior tibial pulses nonpalpable.  Digital hair not present x 10 digits.  Skin temperature gradient WNL b/l.  Dermatological Examination: Skin with normal turgor, texture and tone b/l  Toenails 1-5 b/l discolored, thick, dystrophic with subungual debris and pain with palpation to nailbeds due to thickness of nails.  Musculoskeletal: Muscle strength 5/5 to all LE muscle groups.  Neurological: Sensation intact with 10 gram monofilament.  Vibratory sensation intact.  Assessment: NIDDM Encounter for diabetic foot examination Peripheral vascular disease  Plan: Discussed diabetic foot care principles. Literature dispensed on today. Patient to continue soft, supportive shoe gear daily. Patient to report any pedal injuries to medical professional immediately. Follow up one year. Patient/POA to call should there be a concern in the interim. Patient is being followed by a vascular physician for his vascular disease

## 2023-04-02 ENCOUNTER — Telehealth (INDEPENDENT_AMBULATORY_CARE_PROVIDER_SITE_OTHER): Payer: Self-pay

## 2023-04-02 NOTE — Telephone Encounter (Signed)
Patient spouse reach out to the office to informed that she notice a few days ago that her husband 2nd toe on right foot is dark/discolored. Patient is currently having no pain. Patient spouse stated that she used some fungus creme but seen no difference. Patient spouse is requesting a for an appointment. Please Advise

## 2023-04-05 ENCOUNTER — Other Ambulatory Visit (INDEPENDENT_AMBULATORY_CARE_PROVIDER_SITE_OTHER): Payer: Self-pay | Admitting: Vascular Surgery

## 2023-04-05 DIAGNOSIS — I739 Peripheral vascular disease, unspecified: Secondary | ICD-10-CM

## 2023-04-08 ENCOUNTER — Ambulatory Visit (INDEPENDENT_AMBULATORY_CARE_PROVIDER_SITE_OTHER): Payer: Medicare Other | Admitting: Nurse Practitioner

## 2023-04-08 ENCOUNTER — Encounter (INDEPENDENT_AMBULATORY_CARE_PROVIDER_SITE_OTHER): Payer: Self-pay | Admitting: Nurse Practitioner

## 2023-04-08 ENCOUNTER — Ambulatory Visit (INDEPENDENT_AMBULATORY_CARE_PROVIDER_SITE_OTHER): Payer: Medicare Other

## 2023-04-08 VITALS — BP 134/76 | HR 70 | Resp 16

## 2023-04-08 DIAGNOSIS — I739 Peripheral vascular disease, unspecified: Secondary | ICD-10-CM

## 2023-04-08 DIAGNOSIS — I10 Essential (primary) hypertension: Secondary | ICD-10-CM | POA: Diagnosis not present

## 2023-04-08 DIAGNOSIS — E119 Type 2 diabetes mellitus without complications: Secondary | ICD-10-CM | POA: Diagnosis not present

## 2023-04-08 LAB — VAS US ABI WITH/WO TBI
Left ABI: 0.7
Right ABI: 1.19

## 2023-04-08 NOTE — Progress Notes (Signed)
Subjective:    Patient ID: Edwin Jones, male    DOB: 1941-02-02, 83 y.o.   MRN: [id number] Chief Complaint  Patient presents with   Follow-up    Edwin Jones follow up    The patient presents today due to concern from his wife about discoloration on his right second toe.  This has been present for several weeks and he has had a fungal infection that she has been trying to cure on the toe itself.  On further examination it appears that the skin is discolored but there is no evidence of gangrenous changes.  Unfortunately the patient is a poor historian and notes that he does not have pain but that he does have pain.  However with palpation and manipulation it does not seem to be painful for the patient.  Today noninvasive studies show an ABI 1.19 on the right and 0.70 on the left.  There is monophasic tibial waveforms however the patient does have good toe pressures.  Additional duplexes show a distal right popliteal artery occlusion and a left SFA stenosis.    Review of Systems  Neurological:  Positive for weakness.  Psychiatric/Behavioral:  Positive for confusion.   All other systems reviewed and are negative.      Objective:   Physical Exam Vitals reviewed.  HENT:     Head: Normocephalic.  Cardiovascular:     Rate and Rhythm: Normal rate.     Pulses:          Dorsalis pedis pulses are detected w/ Doppler on the right side and detected w/ Doppler on the left side.       Posterior tibial pulses are detected w/ Doppler on the right side and detected w/ Doppler on the left side.  Pulmonary:     Effort: Pulmonary effort is normal.  Skin:    General: Skin is warm and dry.  Neurological:     Mental Status: He is alert and oriented to person, place, and time.     Motor: Weakness present.  Psychiatric:        Attention and Perception: He is inattentive.        Speech: Speech is delayed.        Cognition and Memory: Cognition is impaired. Memory is impaired.     BP 134/76   Pulse 70    Resp 16   Past Medical History:  Diagnosis Date   Cancer (HCC)    COPD (chronic obstructive pulmonary disease) (HCC)    Diabetes mellitus without complication (HCC)    GERD without esophagitis    Hepatitis C virus    history of hep C treated with Harvoni   Hypercholesterolemia    Hypertension    Seasonal allergies     Social History   Socioeconomic History   Marital status: Married    Spouse name: Not on file   Number of children: Not on file   Years of education: Not on file   Highest education level: Not on file  Occupational History   Not on file  Tobacco Use   Smoking status: Former    Current packs/day: 0.00    Average packs/day: 1 pack/day for 50.0 years (50.0 ttl pk-yrs)    Types: Cigarettes    Start date: 04/07/1954    Quit date: 04/07/2004    Years since quitting: 19.0   Smokeless tobacco: Never  Vaping Use   Vaping status: Not on file  Substance and Sexual Activity   Alcohol use: No  Comment: history of alcohol use   Drug use: No   Sexual activity: Yes  Other Topics Concern   Not on file  Social History Narrative   Not on file   Social Drivers of Health   Financial Resource Strain: Not on file  Food Insecurity: Not on file  Transportation Needs: Not on file  Physical Activity: Not on file  Stress: Not on file  Social Connections: Not on file  Intimate Partner Violence: Not on file    Past Surgical History:  Procedure Laterality Date   COLONOSCOPY     approximately 11 years ago   FLEXIBLE BRONCHOSCOPY N/A 11/05/2016   Procedure: FLEXIBLE BRONCHOSCOPY;  Surgeon: Merwyn Katos, MD;  Location: [facility] ORS;  Service: Pulmonary;  Laterality: N/A;   IR FLUORO GUIDE CV LINE RIGHT  05/06/2017   IR INTRAVSC STENT CERV CAROTID W/O EMB-PROT MOD SED INC ANGIO  05/06/2017   IR PERCUTANEOUS ART THROMBECTOMY/INFUSION INTRACRANIAL INC DIAG ANGIO  05/06/2017   IR US GUIDE VASC ACCESS LEFT  05/06/2017   IR US GUIDE VASC ACCESS RIGHT  05/06/2017   IR US GUIDE  VASC ACCESS RIGHT  05/06/2017   PORTA CATH INSERTION N/A 11/23/2016   Procedure: Shelda Pal Cath Insertion;  Surgeon: Annice Needy, MD;  Location: [facility] INVASIVE CV LAB;  Service: Cardiovascular;  Laterality: N/A;   RADIOLOGY WITH ANESTHESIA N/A 05/06/2017   Procedure: IR WITH ANESTHESIA;  Surgeon: Radiologist, Medication, MD;  Location: [facility] OR;  Service: Radiology;  Laterality: N/A;   TEE WITHOUT CARDIOVERSION N/A 05/10/2017   Procedure: TRANSESOPHAGEAL ECHOCARDIOGRAM (TEE);  Surgeon: Thurmon Fair, MD;  Location: [facility] ENDOSCOPY;  Service: Cardiovascular;  Laterality: N/A;   TONSILLECTOMY      Family History  Problem Relation Age of Onset   Skin cancer Mother    Ovarian cancer Sister        sister was diagnosed as a teenager   Throat cancer Brother 78       brother #1   Lung cancer Sister 37   Throat cancer Brother 32       brother #2   Skin cancer Sister     Allergies  Allergen Reactions   Penicillin G Anaphylaxis    Other reaction(s): Other (See Comments) Couldn't breath among other things Has patient had a PCN reaction causing immediate rash, facial/tongue/throat swelling, SOB or lightheadedness with hypotension: Yes Has patient had a PCN reaction causing severe rash involving mucus membranes or skin necrosis: No Has patient had a PCN reaction that required hospitalization: Yes Has patient had a PCN reaction occurring within the last 10 years: No If all of the above answers are "NO", then may proceed with Ce   Shellfish-Derived Products Anaphylaxis   Statins     Other reaction(s): Other (See Comments) Muscle spasms - can't walk - couldn't turn over in bed   Erythromycin Itching    All mycins   Tamsulosin     Other reaction(s): Dizziness   Tuberculin Ppd     Other reaction(s): Other (See Comments) False test    Iodinated Contrast Media Hives       Latest Ref Rng & Units 05/21/2017    4:31 AM 05/20/2017    4:31 AM 05/19/2017    5:56 AM  CBC  WBC 4.0 - 10.5 K/uL 5.0  5.4  5.5    Hemoglobin 13.0 - 17.0 g/dL 09.8  11.9  14.7   Hematocrit 39.0 - 52.0 % 34.2  32.2  32.6   Platelets 150 -  400 K/uL 310  290  285       CMP     Component Value Date/Time   NA 134 (L) 10/26/2020 1641   K 4.8 10/26/2020 1641   CL 98 10/26/2020 1641   CO2 25 10/26/2020 1641   GLUCOSE 123 (H) 10/26/2020 1641   BUN 28 (H) 10/26/2020 1641   CREATININE 2.20 (H) 10/26/2020 1641   CALCIUM 9.0 10/26/2020 1641   PROT 6.7 05/21/2017 0431   ALBUMIN 2.8 (L) 05/21/2017 0431   AST 13 (L) 05/21/2017 0431   ALT 12 (L) 05/21/2017 0431   ALKPHOS 69 05/21/2017 0431   BILITOT 0.4 05/21/2017 0431   GFRNONAA 30 (L) 10/26/2020 1641     VAS [place] ABI WITH/WO TBI Result Date: 04/08/2023  LOWER EXTREMITY DOPPLER STUDY Patient Name:  KESHAN REHA  Date of Exam:   04/08/2023 Medical Rec #: [id number]      Accession #:    [id number] Date of Birth: 1940-09-09     Patient Gender: M Patient Age:   57 years Exam Location:  [facility] Procedure:      VAS [place] ABI WITH/WO TBI Referring Phys: GREGORY SCHNIER --------------------------------------------------------------------------------  High Risk Factors: Hypertension, Diabetes, past history of smoking, prior MI,                    coronary artery disease. Other Factors: Discoloration of right second toe.  Limitations: Today's exam was limited due to patient in wheelchair. Comparison Study: Prior duplex showed occluded distal right popliteal artery and                   proximal left SFA. Performing Technologist: Hardie Lora RVT  Examination Guidelines: A complete evaluation includes at minimum, Doppler waveform signals and systolic blood pressure reading at the level of bilateral brachial, anterior tibial, and posterior tibial arteries, when vessel segments are accessible. Bilateral testing is considered an integral part of a complete examination. Photoelectric Plethysmograph (PPG) waveforms and toe systolic pressure readings are included as required and  additional duplex testing as needed. Limited examinations for reoccurring indications may be performed as noted.  ABI Findings: +---------+------------------+-----+----------+--------+ Right    Rt Pressure (mmHg)IndexWaveform  Comment  +---------+------------------+-----+----------+--------+ Brachial 127                                       +---------+------------------+-----+----------+--------+ PTA      154               1.19 monophasic         +---------+------------------+-----+----------+--------+ DP       135               1.05 monophasic         +---------+------------------+-----+----------+--------+ Great Toe97                0.75                    +---------+------------------+-----+----------+--------+ +---------+------------------+-----+----------+-------+ Left     Lt Pressure (mmHg)IndexWaveform  Comment +---------+------------------+-----+----------+-------+ Brachial 129                                      +---------+------------------+-----+----------+-------+ PTA      0  0.00 absent            +---------+------------------+-----+----------+-------+ PERO     0                 0.00 absent            +---------+------------------+-----+----------+-------+ DP       90                0.70 monophasic        +---------+------------------+-----+----------+-------+ Great Toe109               0.84                   +---------+------------------+-----+----------+-------+ +-------+-----------+-----------+------------+------------+ ABI/TBIToday's ABIToday's TBIPrevious ABIPrevious TBI +-------+-----------+-----------+------------+------------+ Right  1.19       0.75                                +-------+-----------+-----------+------------+------------+ Left   0.70       0.84                                +-------+-----------+-----------+------------+------------+  Arterial wall calcification precludes  accurate ankle pressures and ABIs. Limited imaging confirms distal right popliteal artery occlusion and left SFA stenosis.  Summary: Right: Resting right ankle-brachial index is within normal range. The right toe-brachial index is abnormal. Although ankle brachial indices are within normal limits (0.95-1.29), arterial Doppler waveforms at the ankle suggest some component of arterial occlusive disease. Left: Resting left ankle-brachial index indicates moderate left lower extremity arterial disease. The left toe-brachial index is abnormal. *See table(s) above for measurements and observations.  Electronically signed by Levora Dredge MD on 04/08/2023 at 4:41:37 PM.    Final        Assessment & Plan:   1. PAD (peripheral artery disease) (HCC) (Primary) I had a long discussion with the patient's wife today regarding his studies.  While the discoloration question is not indicative of poor blood flow he does indeed have some perfusion issues.  However the patient does not walk inside there is no claudication and we are unsure if he is describing rest pain due to his dementia.  Currently there are no open wounds or ulcerations.  I discussed undergoing angiogram for treatment with the patient's wife however at this time because the patient does not seem to exhibit pain symptoms at home per the wife, we will plan on maintaining close follow-up and have the patient return in 3 months for studies.  2. Diabetes mellitus type 2 in nonobese [facility]) Continue hypoglycemic medications as already ordered, these medications have been reviewed and there are no changes at this time.  Hgb A1C to be monitored as already arranged by primary service  3. Benign essential hypertension Continue antihypertensive medications as already ordered, these medications have been reviewed and there are no changes at this time.   Current Outpatient Medications on File Prior to Visit  Medication Sig Dispense Refill   Apple Cider Vinegar  188 MG CAPS Take by mouth.     aspirin 81 MG chewable tablet Chew 4 tablets (324 mg total) by mouth daily. (Patient taking differently: Chew 81 mg by mouth daily.)     blood glucose meter kit and supplies KIT Dispense based on patient and insurance preference. Use up to four times daily as directed. ICD: E11.9 1 each 0   citalopram (CELEXA)  10 MG tablet Take 10 mg by mouth daily.     colchicine 0.6 MG tablet Take by mouth.     Garlic 2 MG CAPS Take by mouth.     glimepiride (AMARYL) 4 MG tablet Take 1 tablet (4 mg total) by mouth daily with breakfast. (Patient taking differently: Take 2 mg by mouth 2 (two) times daily.) 30 tablet 0   insulin glargine (LANTUS) 100 UNIT/ML injection Inject 10-12 Units into the skin daily.     Omega-3 Fatty Acids (OMEGA-3 FISH OIL PO) Take by mouth.     pantoprazole (PROTONIX) 40 MG tablet Take 1 tablet (40 mg total) by mouth daily. 30 tablet 0   protein supplement shake (PREMIER PROTEIN) LIQD Take 325 mLs (11 oz total) by mouth 4 (four) times daily.  0   senna-docusate (SENOKOT-S) 8.6-50 MG tablet Take 1 tablet by mouth at bedtime as needed for mild constipation.     No current facility-administered medications on file prior to visit.    There are no Patient Instructions on file for this visit. No follow-ups on file.   Georgiana Spinner, NP

## 2023-05-26 ENCOUNTER — Institutional Professional Consult (permissible substitution): Admitting: Pulmonary Disease

## 2023-05-26 ENCOUNTER — Telehealth: Payer: Self-pay

## 2023-05-26 NOTE — Telephone Encounter (Signed)
 Images have been requested from [place]. Will schedule an appt as soon as we receive the images.

## 2023-06-02 ENCOUNTER — Institutional Professional Consult (permissible substitution): Admitting: Pulmonary Disease

## 2023-06-04 ENCOUNTER — Other Ambulatory Visit: Payer: Self-pay

## 2023-06-04 ENCOUNTER — Inpatient Hospital Stay
Admission: RE | Admit: 2023-06-04 | Discharge: 2023-06-04 | Disposition: A | Discharge status: Home / Self Care | Payer: Self-pay | Source: Ambulatory Visit | Attending: Pulmonary Disease | Admitting: Pulmonary Disease

## 2023-06-04 DIAGNOSIS — R942 Abnormal results of pulmonary function studies: Secondary | ICD-10-CM

## 2023-06-04 DIAGNOSIS — Z9289 Personal history of other medical treatment: Secondary | ICD-10-CM

## 2023-06-04 NOTE — Telephone Encounter (Signed)
 I spoke with Edwin Jones again. He said there was a mix up in patient's and they were sending out images on another patient. He submitted the order again.

## 2023-06-08 NOTE — Telephone Encounter (Signed)
 We have received the images.  Nothing further needed.

## 2023-06-16 ENCOUNTER — Ambulatory Visit: Admitting: Pulmonary Disease

## 2023-06-16 ENCOUNTER — Encounter: Payer: Self-pay | Admitting: Pulmonary Disease

## 2023-06-16 VITALS — BP 118/72 | HR 91 | Temp 97.1°F | Ht 75.0 in | Wt 210.0 lb

## 2023-06-16 DIAGNOSIS — Z85110 Personal history of malignant carcinoid tumor of bronchus and lung: Secondary | ICD-10-CM

## 2023-06-16 DIAGNOSIS — R059 Cough, unspecified: Secondary | ICD-10-CM

## 2023-06-16 DIAGNOSIS — R0602 Shortness of breath: Secondary | ICD-10-CM

## 2023-06-16 MED ORDER — TRELEGY ELLIPTA 100-62.5-25 MCG/ACT IN AEPB: 1.0000 | INHALATION_SPRAY | Freq: Every day | RESPIRATORY_TRACT | 3 refills | Status: AC

## 2023-06-16 MED ORDER — TRELEGY ELLIPTA 100-62.5-25 MCG/ACT IN AEPB: 1.0000 | INHALATION_SPRAY | Freq: Every day | RESPIRATORY_TRACT | 0 refills | Status: DC

## 2023-06-16 NOTE — Progress Notes (Unsigned)
 Synopsis: Referred in by Wendi Maya, MD   Subjective:   PATIENT ID: Edwin Jones GENDER: male DOB: 20-Oct-1940, MRN: [id number]  Chief Complaint  Patient presents with   Consult    Nodule. No SOB or wheezing. Cough.     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  He underwent a CT chest at the [place] in October which showed mediastinal lymphadenopathy this prompted a PET scan that showed increased FDG avidity at station 4R.   He does report worsening cough for a year with worsening mucus production and does report some degree of worsening shortness of breath.  Although he is sedentary most of the time given history of multiple CVA.  Family history - lung cancer in the family but unable to recall who.   SH - Quit smoking 15 to 20 years ago. Unable to recall specific smoking history.   ROS All systems were reviewed and are negative except for the above.  Objective:   Vitals:   06/16/23 1312  BP: 118/72  Pulse: 91  Temp: (!) 97.1 F (36.2 C)  SpO2: 95%  Weight: 210 lb (95.3 kg)  Height: 6\' 3"  (1.905 m)   95% on RA BMI Readings from Last 3 Encounters:  06/16/23 26.25 kg/m  10/26/20 26.96 kg/m  06/09/17 24.88 kg/m   Wt Readings from Last 3 Encounters:  06/16/23 210 lb (95.3 kg)  10/26/20 210 lb (95.3 kg)  06/09/17 199 lb 1.2 oz (90.3 kg)    Physical Exam GEN: NAD, chronically ill  HEENT: Supple Neck, Reactive Pupils, EOMI  CVS: Normal S1, Normal S2, RRR, No murmurs or ES appreciated  Lungs: diffuse ronchi with poor air movement. Abdomen: Soft, non tender, non distended, + BS  Extremities: Warm and well perfused, No edema   Labs and imaging were reviewed.   Ancillary Information   CBC    Component Value Date/Time   WBC 5.0 05/21/2017 0431   RBC 3.59 (L)  05/21/2017 0431   HGB 11.0 (L) 05/21/2017 0431   HCT 34.2 (L) 05/21/2017 0431   PLT 310 05/21/2017 0431   MCV 95.3 05/21/2017 0431   MCH 30.6 05/21/2017 0431   MCHC 32.2 05/21/2017 0431   RDW 13.0 05/21/2017 0431   LYMPHSABS 1.0 05/21/2017 0431   MONOABS 0.6 05/21/2017 0431   EOSABS 0.2 05/21/2017 0431   BASOSABS 0.0 05/21/2017 0431        No data to display           Assessment & Plan:  Edwin Jones is an 83 year old male patient with a past medical history of limited small cell lung cancer diagnosed in 2018 status post chemoradiation in remission presenting today to the pulmonary clinic with concerns of recurrence.  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   []  PFTs. []  Start Fluticasone-Vilantertol-Umecledinium [Trelegy Ellipta] 1puff once a day.  []  Albuterol as needed.   #Hx of Small Cell lung cancer in 2018 s/p chemo radiation #Suspecting recurrence with enlargen 4R LN and increased FDG avidity.   []  Given Imaging was obtained 6 months ago, I will repeat a CT chest to evaluate progression.  []  Will schedule for an EBUS on 04/29 at [facility] for now  Return  in about 4 weeks (around 07/14/2023).  I spent 60 minutes caring for this patient today, including preparing to see the patient, obtaining a medical history , reviewing a separately obtained history, performing a medically appropriate examination and/or evaluation, counseling and educating the patient/family/caregiver, ordering medications, tests, or procedures, documenting clinical information in the electronic health record, and independently interpreting results (not separately reported/billed) and communicating results to the patient/family/caregiver  Janann Colonel, MD [facility] 06/17/2023 1:44 PM

## 2023-06-17 ENCOUNTER — Telehealth: Payer: Self-pay

## 2023-06-17 ENCOUNTER — Telehealth: Payer: Self-pay | Admitting: Pulmonary Disease

## 2023-06-17 NOTE — Telephone Encounter (Addendum)
-----   Message from Janann Colonel sent at 06/17/2023  1:58 PM EDT ----- Hi,  Can we please schedule this patient for an EBUS on 04/29 at [facility]. Request is in thanks.  EBUS 07/06/2023 at 7:30am Lung Nodule [id number], [id number]  Synetta Fail please see bronch info.

## 2023-06-17 NOTE — Telephone Encounter (Signed)
 Copied from CRM [id number]. Topic: Medical Record Request - Provider/Facility Request >> Jun 17, 2023 12:55 PM Nila Nephew wrote: Reason for CRM: Misty Stanley with [facility] is calling to request progress notes for Fluticasone-Umeclidin-Vilant (TRELEGY ELLIPTA) 100-62.5-25 MCG/ACT AEPB, just the most recent with detail as to how provider arrived at [facility] as the conclusion for treatment. Please fax to [phone]. C/B at [phone] ext [phone] - voicemail is secure.

## 2023-06-17 NOTE — Telephone Encounter (Signed)
 I have notified the patient's wife (DPR) of the date and time.

## 2023-06-18 ENCOUNTER — Emergency Department

## 2023-06-18 ENCOUNTER — Inpatient Hospital Stay
Admission: EM | Admit: 2023-06-18 | Discharge: 2023-07-08 | DRG: 870 | Disposition: A | Discharge status: Home Health | Attending: Internal Medicine | Admitting: Internal Medicine

## 2023-06-18 ENCOUNTER — Other Ambulatory Visit: Payer: Self-pay

## 2023-06-18 DIAGNOSIS — Z881 Allergy status to other antibiotic agents status: Secondary | ICD-10-CM

## 2023-06-18 DIAGNOSIS — J9601 Acute respiratory failure with hypoxia: Principal | ICD-10-CM | POA: Diagnosis present

## 2023-06-18 DIAGNOSIS — Z801 Family history of malignant neoplasm of trachea, bronchus and lung: Secondary | ICD-10-CM

## 2023-06-18 DIAGNOSIS — E1165 Type 2 diabetes mellitus with hyperglycemia: Secondary | ICD-10-CM | POA: Diagnosis not present

## 2023-06-18 DIAGNOSIS — E7800 Pure hypercholesterolemia, unspecified: Secondary | ICD-10-CM | POA: Diagnosis present

## 2023-06-18 DIAGNOSIS — I472 Ventricular tachycardia, unspecified: Secondary | ICD-10-CM | POA: Diagnosis not present

## 2023-06-18 DIAGNOSIS — Z923 Personal history of irradiation: Secondary | ICD-10-CM

## 2023-06-18 DIAGNOSIS — I82721 Chronic embolism and thrombosis of deep veins of right upper extremity: Secondary | ICD-10-CM | POA: Diagnosis not present

## 2023-06-18 DIAGNOSIS — A419 Sepsis, unspecified organism: Secondary | ICD-10-CM | POA: Diagnosis not present

## 2023-06-18 DIAGNOSIS — I214 Non-ST elevation (NSTEMI) myocardial infarction: Secondary | ICD-10-CM | POA: Diagnosis not present

## 2023-06-18 DIAGNOSIS — E872 Acidosis, unspecified: Secondary | ICD-10-CM | POA: Diagnosis present

## 2023-06-18 DIAGNOSIS — J441 Chronic obstructive pulmonary disease with (acute) exacerbation: Secondary | ICD-10-CM | POA: Diagnosis present

## 2023-06-18 DIAGNOSIS — I82629 Acute embolism and thrombosis of deep veins of unspecified upper extremity: Secondary | ICD-10-CM

## 2023-06-18 DIAGNOSIS — C3490 Malignant neoplasm of unspecified part of unspecified bronchus or lung: Secondary | ICD-10-CM | POA: Diagnosis present

## 2023-06-18 DIAGNOSIS — J9602 Acute respiratory failure with hypercapnia: Secondary | ICD-10-CM | POA: Diagnosis not present

## 2023-06-18 DIAGNOSIS — Z85118 Personal history of other malignant neoplasm of bronchus and lung: Secondary | ICD-10-CM

## 2023-06-18 DIAGNOSIS — Z794 Long term (current) use of insulin: Secondary | ICD-10-CM | POA: Diagnosis not present

## 2023-06-18 DIAGNOSIS — Z887 Allergy status to serum and vaccine status: Secondary | ICD-10-CM

## 2023-06-18 DIAGNOSIS — I10 Essential (primary) hypertension: Secondary | ICD-10-CM | POA: Diagnosis not present

## 2023-06-18 DIAGNOSIS — Z8673 Personal history of transient ischemic attack (TIA), and cerebral infarction without residual deficits: Secondary | ICD-10-CM

## 2023-06-18 DIAGNOSIS — J189 Pneumonia, unspecified organism: Secondary | ICD-10-CM | POA: Diagnosis present

## 2023-06-18 DIAGNOSIS — N179 Acute kidney failure, unspecified: Secondary | ICD-10-CM | POA: Diagnosis not present

## 2023-06-18 DIAGNOSIS — J440 Chronic obstructive pulmonary disease with acute lower respiratory infection: Secondary | ICD-10-CM | POA: Diagnosis present

## 2023-06-18 DIAGNOSIS — I82C21 Chronic embolism and thrombosis of right internal jugular vein: Secondary | ICD-10-CM | POA: Diagnosis present

## 2023-06-18 DIAGNOSIS — Z7951 Long term (current) use of inhaled steroids: Secondary | ICD-10-CM

## 2023-06-18 DIAGNOSIS — R1310 Dysphagia, unspecified: Secondary | ICD-10-CM | POA: Diagnosis not present

## 2023-06-18 DIAGNOSIS — I5032 Chronic diastolic (congestive) heart failure: Secondary | ICD-10-CM | POA: Diagnosis present

## 2023-06-18 DIAGNOSIS — G9341 Metabolic encephalopathy: Secondary | ICD-10-CM | POA: Diagnosis not present

## 2023-06-18 DIAGNOSIS — E1122 Type 2 diabetes mellitus with diabetic chronic kidney disease: Secondary | ICD-10-CM | POA: Diagnosis present

## 2023-06-18 DIAGNOSIS — Z808 Family history of malignant neoplasm of other organs or systems: Secondary | ICD-10-CM

## 2023-06-18 DIAGNOSIS — C3492 Malignant neoplasm of unspecified part of left bronchus or lung: Secondary | ICD-10-CM | POA: Diagnosis not present

## 2023-06-18 DIAGNOSIS — R0602 Shortness of breath: Secondary | ICD-10-CM | POA: Diagnosis not present

## 2023-06-18 DIAGNOSIS — D649 Anemia, unspecified: Secondary | ICD-10-CM | POA: Diagnosis present

## 2023-06-18 DIAGNOSIS — I130 Hypertensive heart and chronic kidney disease with heart failure and stage 1 through stage 4 chronic kidney disease, or unspecified chronic kidney disease: Secondary | ICD-10-CM | POA: Diagnosis present

## 2023-06-18 DIAGNOSIS — Z1152 Encounter for screening for COVID-19: Secondary | ICD-10-CM

## 2023-06-18 DIAGNOSIS — Z91013 Allergy to seafood: Secondary | ICD-10-CM

## 2023-06-18 DIAGNOSIS — K219 Gastro-esophageal reflux disease without esophagitis: Secondary | ICD-10-CM | POA: Diagnosis present

## 2023-06-18 DIAGNOSIS — Z79899 Other long term (current) drug therapy: Secondary | ICD-10-CM

## 2023-06-18 DIAGNOSIS — Z880 Allergy status to penicillin: Secondary | ICD-10-CM

## 2023-06-18 DIAGNOSIS — N400 Enlarged prostate without lower urinary tract symptoms: Secondary | ICD-10-CM | POA: Diagnosis present

## 2023-06-18 DIAGNOSIS — Z888 Allergy status to other drugs, medicaments and biological substances status: Secondary | ICD-10-CM

## 2023-06-18 DIAGNOSIS — N1832 Chronic kidney disease, stage 3b: Secondary | ICD-10-CM | POA: Diagnosis present

## 2023-06-18 DIAGNOSIS — N170 Acute kidney failure with tubular necrosis: Secondary | ICD-10-CM | POA: Diagnosis present

## 2023-06-18 DIAGNOSIS — Z7984 Long term (current) use of oral hypoglycemic drugs: Secondary | ICD-10-CM

## 2023-06-18 DIAGNOSIS — R54 Age-related physical debility: Secondary | ICD-10-CM | POA: Diagnosis present

## 2023-06-18 DIAGNOSIS — Z515 Encounter for palliative care: Secondary | ICD-10-CM | POA: Diagnosis not present

## 2023-06-18 DIAGNOSIS — R6521 Severe sepsis with septic shock: Secondary | ICD-10-CM | POA: Diagnosis not present

## 2023-06-18 DIAGNOSIS — Z91041 Radiographic dye allergy status: Secondary | ICD-10-CM

## 2023-06-18 DIAGNOSIS — Z9221 Personal history of antineoplastic chemotherapy: Secondary | ICD-10-CM

## 2023-06-18 DIAGNOSIS — I82621 Acute embolism and thrombosis of deep veins of right upper extremity: Secondary | ICD-10-CM | POA: Diagnosis not present

## 2023-06-18 DIAGNOSIS — I5031 Acute diastolic (congestive) heart failure: Secondary | ICD-10-CM | POA: Diagnosis not present

## 2023-06-18 DIAGNOSIS — Z87891 Personal history of nicotine dependence: Secondary | ICD-10-CM

## 2023-06-18 DIAGNOSIS — Z7982 Long term (current) use of aspirin: Secondary | ICD-10-CM

## 2023-06-18 LAB — PHOSPHORUS: Phosphorus: 5.7 mg/dL — ABNORMAL HIGH (ref 2.5–4.6)

## 2023-06-18 LAB — COMPREHENSIVE METABOLIC PANEL WITH GFR
ALT: 10 U/L (ref 0–44)
AST: 20 U/L (ref 15–41)
Albumin: 3.1 g/dL — ABNORMAL LOW (ref 3.5–5.0)
Alkaline Phosphatase: 62 U/L (ref 38–126)
Anion gap: 15 (ref 5–15)
BUN: 35 mg/dL — ABNORMAL HIGH (ref 8–23)
CO2: 19 mmol/L — ABNORMAL LOW (ref 22–32)
Calcium: 8.8 mg/dL — ABNORMAL LOW (ref 8.9–10.3)
Chloride: 108 mmol/L (ref 98–111)
Creatinine, Ser: 1.92 mg/dL — ABNORMAL HIGH (ref 0.61–1.24)
GFR, Estimated: 34 mL/min — ABNORMAL LOW (ref >60)
Glucose, Bld: 316 mg/dL — ABNORMAL HIGH (ref 70–99)
Potassium: 4.1 mmol/L (ref 3.5–5.1)
Sodium: 142 mmol/L (ref 135–145)
Total Bilirubin: 0.6 mg/dL (ref 0.0–1.2)
Total Protein: 7.8 g/dL (ref 6.5–8.1)

## 2023-06-18 LAB — BLOOD GAS, VENOUS
Acid-base deficit: 6 mmol/L — ABNORMAL HIGH (ref 0.0–2.0)
Bicarbonate: 21.5 mmol/L (ref 20.0–28.0)
Delivery systems: POSITIVE
FIO2: 1 %
O2 Saturation: 82.7 %
Patient temperature: 37
pCO2, Ven: 49 mmHg (ref 44–60)
pH, Ven: 7.25 (ref 7.25–7.43)
pO2, Ven: 54 mmHg — ABNORMAL HIGH (ref 32–45)

## 2023-06-18 LAB — PROCALCITONIN: Procalcitonin: 1.6 ng/mL

## 2023-06-18 LAB — URINALYSIS, W/ REFLEX TO CULTURE (INFECTION SUSPECTED)
Bacteria, UA: NONE SEEN
Bilirubin Urine: NEGATIVE
Glucose, UA: 50 mg/dL — AB
Hgb urine dipstick: NEGATIVE
Ketones, ur: NEGATIVE mg/dL
Leukocytes,Ua: NEGATIVE
Nitrite: NEGATIVE
Protein, ur: 100 mg/dL — AB
Specific Gravity, Urine: 1.023 (ref 1.005–1.030)
Squamous Epithelial / HPF: 0 /HPF (ref 0–5)
pH: 5 (ref 5.0–8.0)

## 2023-06-18 LAB — BLOOD GAS, ARTERIAL
Acid-base deficit: 6.1 mmol/L — ABNORMAL HIGH (ref 0.0–2.0)
Bicarbonate: 21.1 mmol/L (ref 20.0–28.0)
FIO2: 100 %
MECHVT: 550 mL
O2 Saturation: 98.8 %
PEEP: 8 cmH2O
Patient temperature: 37
RATE: 18 {breaths}/min
pCO2 arterial: 47 mmHg (ref 32–48)
pH, Arterial: 7.26 — ABNORMAL LOW (ref 7.35–7.45)
pO2, Arterial: 125 mmHg — ABNORMAL HIGH (ref 83–108)

## 2023-06-18 LAB — TROPONIN I (HIGH SENSITIVITY)
Troponin I (High Sensitivity): 96 ng/L — ABNORMAL HIGH (ref <18)
Troponin I (High Sensitivity): 4173 ng/L (ref <18)

## 2023-06-18 LAB — CBC WITH DIFFERENTIAL/PLATELET
Abs Immature Granulocytes: 0.14 10*3/uL — ABNORMAL HIGH (ref 0.00–0.07)
Basophils Absolute: 0.1 10*3/uL (ref 0.0–0.1)
Basophils Relative: 0 %
Eosinophils Absolute: 0 10*3/uL (ref 0.0–0.5)
Eosinophils Relative: 0 %
HCT: 40.1 % (ref 39.0–52.0)
Hemoglobin: 12.8 g/dL — ABNORMAL LOW (ref 13.0–17.0)
Immature Granulocytes: 1 %
Lymphocytes Relative: 14 %
Lymphs Abs: 2.7 10*3/uL (ref 0.7–4.0)
MCH: 31.1 pg (ref 26.0–34.0)
MCHC: 31.9 g/dL (ref 30.0–36.0)
MCV: 97.3 fL (ref 80.0–100.0)
Monocytes Absolute: 1.3 10*3/uL — ABNORMAL HIGH (ref 0.1–1.0)
Monocytes Relative: 7 %
Neutro Abs: 15 10*3/uL — ABNORMAL HIGH (ref 1.7–7.7)
Neutrophils Relative %: 78 %
Platelets: 426 10*3/uL — ABNORMAL HIGH (ref 150–400)
RBC: 4.12 MIL/uL — ABNORMAL LOW (ref 4.22–5.81)
RDW: 15.2 % (ref 11.5–15.5)
WBC: 19.2 10*3/uL — ABNORMAL HIGH (ref 4.0–10.5)
nRBC: 0 % (ref 0.0–0.2)

## 2023-06-18 LAB — GLUCOSE, CAPILLARY
Glucose-Capillary: 229 mg/dL — ABNORMAL HIGH (ref 70–99)
Glucose-Capillary: 289 mg/dL — ABNORMAL HIGH (ref 70–99)

## 2023-06-18 LAB — BRAIN NATRIURETIC PEPTIDE: B Natriuretic Peptide: 87.6 pg/mL (ref 0.0–100.0)

## 2023-06-18 LAB — RESP PANEL BY RT-PCR (RSV, FLU A&B, COVID)  RVPGX2
Influenza A by PCR: NEGATIVE
Influenza B by PCR: NEGATIVE
Resp Syncytial Virus by PCR: NEGATIVE
SARS Coronavirus 2 by RT PCR: NEGATIVE

## 2023-06-18 LAB — PROTIME-INR
INR: 1.2 (ref 0.8–1.2)
Prothrombin Time: 15.3 s — ABNORMAL HIGH (ref 11.4–15.2)

## 2023-06-18 LAB — LACTIC ACID, PLASMA
Lactic Acid, Venous: 4.7 mmol/L (ref 0.5–1.9)
Lactic Acid, Venous: 4.6 mmol/L (ref 0.5–1.9)

## 2023-06-18 LAB — CBG MONITORING, ED
Glucose-Capillary: 292 mg/dL — ABNORMAL HIGH (ref 70–99)
Glucose-Capillary: 311 mg/dL — ABNORMAL HIGH (ref 70–99)

## 2023-06-18 LAB — MAGNESIUM: Magnesium: 2 mg/dL (ref 1.7–2.4)

## 2023-06-18 MED ORDER — SODIUM CHLORIDE 0.9 % IV SOLN: 5.0000 mg | Freq: Every day | INTRAVENOUS | Status: DC

## 2023-06-18 MED ORDER — DOCUSATE SODIUM 100 MG PO CAPS: 100.0000 mg | ORAL_CAPSULE | Freq: Two times a day (BID) | ORAL | Status: DC | PRN

## 2023-06-18 MED ORDER — SODIUM BICARBONATE 8.4 % IV SOLN
50.0000 meq | Freq: Once | INTRAVENOUS | Status: AC
Start: 2023-06-19 — End: 2023-06-19
  Administered 2023-06-19: 50 meq via INTRAVENOUS
  Filled 2023-06-18: qty 50

## 2023-06-18 MED ORDER — LACTATED RINGERS IV BOLUS
1000.0000 mL | Freq: Once | INTRAVENOUS | Status: AC
  Administered 2023-06-18: 1000 mL via INTRAVENOUS

## 2023-06-18 MED ORDER — VANCOMYCIN HCL 2000 MG/400ML IV SOLN
2000.0000 mg | Freq: Once | INTRAVENOUS | Status: AC
  Administered 2023-06-18: 2000 mg via INTRAVENOUS
  Filled 2023-06-18 (×2): qty 400

## 2023-06-18 MED ORDER — IPRATROPIUM-ALBUTEROL 0.5-2.5 (3) MG/3ML IN SOLN
3.0000 mL | RESPIRATORY_TRACT | Status: DC
  Administered 2023-06-18 – 2023-06-23 (×29): 3 mL via RESPIRATORY_TRACT
  Filled 2023-06-18 (×29): qty 3

## 2023-06-18 MED ORDER — PROPOFOL 1000 MG/100ML IV EMUL
5.0000 ug/kg/min | INTRAVENOUS | Status: DC
  Administered 2023-06-18: 5 ug/kg/min via INTRAVENOUS
  Administered 2023-06-19: 10 ug/kg/min via INTRAVENOUS
  Administered 2023-06-19 (×2): 25 ug/kg/min via INTRAVENOUS
  Administered 2023-06-20: 30 ug/kg/min via INTRAVENOUS
  Administered 2023-06-20 (×2): 20 ug/kg/min via INTRAVENOUS
  Administered 2023-06-20 – 2023-06-21 (×3): 30 ug/kg/min via INTRAVENOUS
  Filled 2023-06-18 (×10): qty 100

## 2023-06-18 MED ORDER — SODIUM CHLORIDE 0.9 % IV SOLN
500.0000 mg | INTRAVENOUS | Status: DC
  Administered 2023-06-19 – 2023-06-21 (×3): 500 mg via INTRAVENOUS
  Filled 2023-06-18 (×4): qty 5

## 2023-06-18 MED ORDER — LACTATED RINGERS IV BOLUS (SEPSIS)
1000.0000 mL | Freq: Once | INTRAVENOUS | Status: AC
  Administered 2023-06-18: 1000 mL via INTRAVENOUS

## 2023-06-18 MED ORDER — THIAMINE HCL 100 MG/ML IJ SOLN: 500.0000 mg | INTRAVENOUS | Status: DC

## 2023-06-18 MED ORDER — POLYETHYLENE GLYCOL 3350 17 G PO PACK: 17.0000 g | PACK | Freq: Every day | ORAL | Status: DC | PRN

## 2023-06-18 MED ORDER — SODIUM CHLORIDE 0.9 % IV SOLN
1.0000 g | INTRAVENOUS | Status: DC
  Administered 2023-06-19: 1 g via INTRAVENOUS
  Filled 2023-06-18 (×2): qty 10

## 2023-06-18 MED ORDER — NOREPINEPHRINE 4 MG/250ML-% IV SOLN
0.0000 ug/min | INTRAVENOUS | Status: DC
  Administered 2023-06-18: 12 ug/min via INTRAVENOUS
  Administered 2023-06-19: 6 ug/min via INTRAVENOUS
  Administered 2023-06-19: 8 ug/min via INTRAVENOUS
  Administered 2023-06-19: 12 ug/min via INTRAVENOUS
  Administered 2023-06-20: 6 ug/min via INTRAVENOUS
  Administered 2023-06-20: 4 ug/min via INTRAVENOUS
  Filled 2023-06-18 (×7): qty 250

## 2023-06-18 MED ORDER — FAMOTIDINE 20 MG PO TABS
20.0000 mg | ORAL_TABLET | Freq: Two times a day (BID) | ORAL | Status: DC
Start: 2023-06-18 — End: 2023-06-18

## 2023-06-18 MED ORDER — ORAL CARE MOUTH RINSE
15.0000 mL | OROMUCOSAL | Status: DC
  Administered 2023-06-18 – 2023-07-08 (×213): 15 mL via OROMUCOSAL

## 2023-06-18 MED ORDER — VANCOMYCIN VARIABLE DOSE PER UNSTABLE RENAL FUNCTION (PHARMACIST DOSING): Status: DC

## 2023-06-18 MED ORDER — HEPARIN SODIUM (PORCINE) 5000 UNIT/ML IJ SOLN
5000.0000 [IU] | Freq: Three times a day (TID) | INTRAMUSCULAR | Status: DC
  Administered 2023-06-18: 5000 [IU] via SUBCUTANEOUS
  Filled 2023-06-18: qty 1

## 2023-06-18 MED ORDER — LACTATED RINGERS IV SOLN: INTRAVENOUS | Status: AC

## 2023-06-18 MED ORDER — DOCUSATE SODIUM 50 MG/5ML PO LIQD: 100.0000 mg | Freq: Two times a day (BID) | ORAL | Status: DC | PRN

## 2023-06-18 MED ORDER — ADULT MULTIVITAMIN LIQUID CH
15.0000 mL | Freq: Every day | ORAL | Status: DC
Start: 2023-06-18 — End: 2023-06-18

## 2023-06-18 MED ORDER — SUCCINYLCHOLINE CHLORIDE 200 MG/10ML IV SOSY
100.0000 mg | PREFILLED_SYRINGE | Freq: Once | INTRAVENOUS | Status: AC
Start: 2023-06-18 — End: 2023-06-18
  Administered 2023-06-18: 100 mg via INTRAVENOUS

## 2023-06-18 MED ORDER — LEVOFLOXACIN IN D5W 750 MG/150ML IV SOLN
750.0000 mg | Freq: Once | INTRAVENOUS | Status: AC
  Administered 2023-06-18: 750 mg via INTRAVENOUS
  Filled 2023-06-18: qty 150

## 2023-06-18 MED ORDER — INSULIN ASPART 100 UNIT/ML IJ SOLN
0.0000 [IU] | INTRAMUSCULAR | Status: DC
  Administered 2023-06-18: 11 [IU] via SUBCUTANEOUS
  Administered 2023-06-19: 5 [IU] via SUBCUTANEOUS
  Administered 2023-06-19 (×2): 8 [IU] via SUBCUTANEOUS
  Administered 2023-06-19 (×3): 5 [IU] via SUBCUTANEOUS
  Administered 2023-06-20: 8 [IU] via SUBCUTANEOUS
  Administered 2023-06-20: 3 [IU] via SUBCUTANEOUS
  Administered 2023-06-20: 5 [IU] via SUBCUTANEOUS
  Administered 2023-06-20: 8 [IU] via SUBCUTANEOUS
  Administered 2023-06-20 (×2): 5 [IU] via SUBCUTANEOUS
  Administered 2023-06-20 – 2023-06-21 (×2): 8 [IU] via SUBCUTANEOUS
  Administered 2023-06-21: 2 [IU] via SUBCUTANEOUS
  Administered 2023-06-21: 5 [IU] via SUBCUTANEOUS
  Administered 2023-06-21: 3 [IU] via SUBCUTANEOUS
  Administered 2023-06-22: 5 [IU] via SUBCUTANEOUS
  Administered 2023-06-22 (×2): 3 [IU] via SUBCUTANEOUS
  Administered 2023-06-22: 2 [IU] via SUBCUTANEOUS
  Administered 2023-06-22 (×2): 3 [IU] via SUBCUTANEOUS
  Administered 2023-06-23 – 2023-06-27 (×12): 2 [IU] via SUBCUTANEOUS
  Administered 2023-06-27: 3 [IU] via SUBCUTANEOUS
  Administered 2023-06-27 – 2023-06-28 (×2): 2 [IU] via SUBCUTANEOUS
  Administered 2023-06-28 – 2023-06-29 (×4): 3 [IU] via SUBCUTANEOUS
  Administered 2023-06-29 (×3): 5 [IU] via SUBCUTANEOUS
  Administered 2023-06-29 – 2023-06-30 (×3): 3 [IU] via SUBCUTANEOUS
  Administered 2023-06-30: 5 [IU] via SUBCUTANEOUS
  Administered 2023-06-30: 8 [IU] via SUBCUTANEOUS
  Administered 2023-06-30: 5 [IU] via SUBCUTANEOUS
  Administered 2023-06-30 (×2): 3 [IU] via SUBCUTANEOUS
  Administered 2023-07-01: 2 [IU] via SUBCUTANEOUS
  Administered 2023-07-01 (×2): 3 [IU] via SUBCUTANEOUS
  Administered 2023-07-01: 2 [IU] via SUBCUTANEOUS
  Administered 2023-07-01: 3 [IU] via SUBCUTANEOUS
  Administered 2023-07-01 – 2023-07-02 (×2): 5 [IU] via SUBCUTANEOUS
  Administered 2023-07-02: 8 [IU] via SUBCUTANEOUS
  Administered 2023-07-02 (×3): 5 [IU] via SUBCUTANEOUS
  Administered 2023-07-03: 3 [IU] via SUBCUTANEOUS
  Administered 2023-07-03: 5 [IU] via SUBCUTANEOUS
  Administered 2023-07-03: 2 [IU] via SUBCUTANEOUS
  Administered 2023-07-03: 5 [IU] via SUBCUTANEOUS
  Administered 2023-07-04 (×2): 3 [IU] via SUBCUTANEOUS
  Administered 2023-07-04: 8 [IU] via SUBCUTANEOUS
  Administered 2023-07-04 (×2): 2 [IU] via SUBCUTANEOUS
  Administered 2023-07-05 (×2): 3 [IU] via SUBCUTANEOUS
  Filled 2023-06-18 (×77): qty 1

## 2023-06-18 MED ORDER — ETOMIDATE 2 MG/ML IV SOLN
20.0000 mg | Freq: Once | INTRAVENOUS | Status: AC
Start: 2023-06-18 — End: 2023-06-18
  Administered 2023-06-18: 20 mg via INTRAVENOUS

## 2023-06-18 MED ORDER — HYDROCORTISONE SOD SUC (PF) 100 MG IJ SOLR
100.0000 mg | Freq: Three times a day (TID) | INTRAMUSCULAR | Status: AC
  Administered 2023-06-18: 100 mg via INTRAVENOUS
  Filled 2023-06-18 (×2): qty 2

## 2023-06-18 MED ORDER — FENTANYL 2500MCG IN NS 250ML (10MCG/ML) PREMIX INFUSION
0.0000 ug/h | INTRAVENOUS | Status: DC
  Administered 2023-06-18: 25 ug/h via INTRAVENOUS
  Administered 2023-06-20 (×2): 125 ug/h via INTRAVENOUS
  Administered 2023-06-22 – 2023-06-23 (×2): 75 ug/h via INTRAVENOUS
  Filled 2023-06-18 (×5): qty 250

## 2023-06-18 MED ORDER — CYANOCOBALAMIN 1000 MCG/ML IJ SOLN: 1000.0000 ug | Freq: Once | INTRAMUSCULAR | Status: DC

## 2023-06-18 MED ORDER — NOREPINEPHRINE 4 MG/250ML-% IV SOLN
INTRAVENOUS | Status: AC
Start: 2023-06-18 — End: 2023-06-18
  Filled 2023-06-18: qty 250

## 2023-06-18 MED ORDER — FAMOTIDINE IN NACL 20-0.9 MG/50ML-% IV SOLN
20.0000 mg | Freq: Two times a day (BID) | INTRAVENOUS | Status: DC
  Administered 2023-06-18 – 2023-06-20 (×5): 20 mg via INTRAVENOUS
  Filled 2023-06-18 (×5): qty 50

## 2023-06-18 MED ORDER — ORAL CARE MOUTH RINSE: 15.0000 mL | OROMUCOSAL | Status: DC | PRN

## 2023-06-18 MED ORDER — BUDESONIDE 0.5 MG/2ML IN SUSP
0.5000 mg | Freq: Two times a day (BID) | RESPIRATORY_TRACT | Status: DC
  Administered 2023-06-19 – 2023-07-08 (×39): 0.5 mg via RESPIRATORY_TRACT
  Filled 2023-06-18 (×40): qty 2

## 2023-06-18 NOTE — ED Notes (Signed)
 100 succ given by RN Mayme Genta

## 2023-06-18 NOTE — H&P (Incomplete)
 NAME:  Edwin Jones, MRN:  [id number], DOB:  01/17/1941, LOS: 0 ADMISSION DATE:  06/18/2023 CONSULTATION DATE:  Cindi Carbon MD:  ***, CHIEF COMPLAINT:  ***   BRIEF SYNOPSIS  History of Present Illness:  ***  Pertinent  Medical History  ***  Significant Hospital Events: Including procedures, antibiotic start and stop dates in addition to other pertinent events        Micro Data:  ***  Antimicrobials:  ***       Interim History / Subjective:  ***     Objective   Blood pressure 102/73, pulse 100, temperature 99.3 F (37.4 C), resp. rate (!) 24, height 6\' 3"  (1.905 m), weight 95.3 kg, SpO2 97%.    Vent Mode: PRVC FiO2 (%):  [100 %] 100 % Set Rate:  [18 bmp] 18 bmp Vt Set:  [550 mL] 550 mL PEEP:  [8 cmH20] 8 cmH20   Intake/Output Summary (Last 24 hours) at 06/18/2023 2122 Last data filed at 06/18/2023 1918 Gross per 24 hour  Intake 94.51 ml  Output --  Net 94.51 ml   Filed Weights   06/18/23 1710  Weight: 95.3 kg     REVIEW OF SYSTEMS  PATIENT IS UNABLE TO PROVIDE COMPLETE REVIEW OF SYSTEMS DUE TO SEVERE CRITICAL ILLNESS   PHYSICAL EXAMINATION:  GENERAL:critically ill appearing, +resp distress EYES: Pupils equal, round, reactive to light.  No scleral icterus.  MOUTH: Moist mucosal membrane. INTUBATED NECK: Supple.  PULMONARY: Lungs clear to auscultation, +rhonchi, +wheezing CARDIOVASCULAR: S1 and S2.  Regular rate and rhythm GASTROINTESTINAL: Soft, nontender, -distended. Positive bowel sounds.  MUSCULOSKELETAL: No swelling, clubbing, or edema.  NEUROLOGIC: obtunded,sedated SKIN:normal, warm to touch, Capillary refill delayed  Pulses present bilaterally   Labs/imaging that I {ACTIONS; HAVE/HAVE NOT:19434}personally reviewed  (right click and "Reselect all SmartList Selections" daily)  ***  Resolved Hospital Problem list   ***    ASSESSMENT AND PLAN SYNOPSIS:  #1 Respiratory Acute hypoxic respiratory failure Community acquired  pneumonia  #Cardiovascular NSTEMI-Troponin trending up from 96 to 4173   Severe ACUTE Hypoxic and Hypercapnic Respiratory Failure -continue Mechanical Ventilator support -continue Bronchodilator Therapy -Wean Fio2 and PEEP as tolerated -VAP/VENT bundle implementation -will perform SAT/SBT when respiratory parameters are met  Vent Mode: PRVC FiO2 (%):  [100 %] 100 % Set Rate:  [18 bmp] 18 bmp Vt Set:  [550 mL] 550 mL PEEP:  [8 cmH20] 8 cmH20   SEVERE COPD EXACERBATION -continue IV steroids as prescribed -continue NEB THERAPY as prescribed -morphine as needed -wean fio2 as needed and tolerated   SEVERE ALCOHOL WITHDRAWAL -Therapy with Thiamine and MVI -CIWA Protocol -Precedex as needed -High risk for intubation  -high risk for aspiration   CARDIAC FAILURE-acute combined systolic/diastolic dysfunction -oxygen as needed -Lasix as tolerated -follow up cardiac enzymes as indicated   CARDIAC ICU monitoring   ACUTE KIDNEY INJURY/Renal Failure -continue Foley Catheter-assess need -Avoid nephrotoxic agents -Follow urine output, BMP -Ensure adequate renal perfusion, optimize oxygenation -Renal dose medications   Intake/Output Summary (Last 24 hours) at 06/18/2023 2122 Last data filed at 06/18/2023 1918 Gross per 24 hour  Intake 94.51 ml  Output --  Net 94.51 ml     NEUROLOGY Acute  metabolic encephalopathy, need for sedation Goal RASS -2 to -3   SEPTIC SHOCK SOURCE- -use vasopressors to keep MAP>65 as needed -follow ABG and LA -follow up cultures -emperic ABX -consider stress dose steroids -aggressive IV fluid resuscitation  INFECTIOUS DISEASE -continue antibiotics as prescribed -follow up cultures -follow up  ID consultation  ENDO - ICU hypoglycemic\Hyperglycemia protocol -check FSBS per protocol   GI GI PROPHYLAXIS as indicated  NUTRITIONAL STATUS DIET-->TF's as tolerated Constipation protocol as indicated   ELECTROLYTES -follow labs  as needed -replace as needed -pharmacy consultation and following   ACUTE ANEMIA- TRANSFUSE AS NEEDED CONSIDER TRANSFUSION  IF HGB<7 DVT PRX with TED/SCD's ONLY     Best practice (right click and "Reselect all SmartList Selections" daily)  Diet: NPO Pain/Anxiety/Delirium protocol (if indicated): {Pain/Anxiety/Delirium:26941} VAP protocol (if indicated): {VAP:29640} DVT prophylaxis: {DVT Prophylaxis:26933} GI prophylaxis: {GI:26934} Glucose control:  {Glucose Control:26935} Central venous access:  {Central Venous Access:26936} Arterial line:  {Central Venous Access:26936} Foley:  {Central Venous Access:26936} Mobility:  {Mobility:26937}  Code Status:  FULL Disposition:ICU  Labs   CBC: Recent Labs  Lab 06/18/23 1717  WBC 19.2*  NEUTROABS 15.0*  HGB 12.8*  HCT 40.1  MCV 97.3  PLT 426*    Basic Metabolic Panel: Recent Labs  Lab 06/18/23 1717  NA 142  K 4.1  CL 108  CO2 19*  GLUCOSE 316*  BUN 35*  CREATININE 1.92*  CALCIUM 8.8*  MG 2.0  PHOS 5.7*   GFR: Estimated Creatinine Clearance: 34.8 mL/min (A) (by C-G formula based on SCr of 1.92 mg/dL (H)). Recent Labs  Lab 06/18/23 1717  WBC 19.2*  LATICACIDVEN 4.7*    Liver Function Tests: Recent Labs  Lab 06/18/23 1717  AST 20  ALT 10  ALKPHOS 62  BILITOT 0.6  PROT 7.8  ALBUMIN 3.1*   No results for input(s): "LIPASE", "AMYLASE" in the last 168 hours. No results for input(s): "AMMONIA" in the last 168 hours.  ABG    Component Value Date/Time   PHART 7.26 (L) 06/18/2023 2108   PCO2ART 47 06/18/2023 2108   PO2ART 125 (H) 06/18/2023 2108   HCO3 21.1 06/18/2023 2108   TCO2 25 05/06/2017 0329   ACIDBASEDEF 6.1 (H) 06/18/2023 2108   O2SAT 98.8 06/18/2023 2108     Coagulation Profile: No results for input(s): "INR", "PROTIME" in the last 168 hours.  Cardiac Enzymes: No results for input(s): "CKTOTAL", "CKMB", "CKMBINDEX", "TROPONINI" in the last 168 hours.  HbA1C: Hgb A1c MFr Bld   Date/Time Value Ref Range Status  05/07/2017 05:00 AM 9.1 (H) 4.8 - 5.6 % Final    Comment:    (NOTE) Pre diabetes:          5.7%-6.4% Diabetes:              >6.4% Glycemic control for   <7.0% adults with diabetes     CBG: Recent Labs  Lab 06/18/23 1713 06/18/23 2045  GLUCAP 292* 311*     Past Medical History:  He,  has a past medical history of Cancer (HCC), COPD (chronic obstructive pulmonary disease) (HCC), Diabetes mellitus without complication (HCC), GERD without esophagitis, Hepatitis C virus, Hypercholesterolemia, Hypertension, and Seasonal allergies.   Surgical History:   Past Surgical History:  Procedure Laterality Date  . COLONOSCOPY     approximately 11 years ago  . FLEXIBLE BRONCHOSCOPY N/A 11/05/2016   Procedure: FLEXIBLE BRONCHOSCOPY;  Surgeon: Merwyn Katos, MD;  Location: [facility] ORS;  Service: Pulmonary;  Laterality: N/A;  . IR FLUORO GUIDE CV LINE RIGHT  05/06/2017  . IR INTRAVSC STENT CERV CAROTID W/O EMB-PROT MOD SED INC ANGIO  05/06/2017  . IR PERCUTANEOUS ART THROMBECTOMY/INFUSION INTRACRANIAL INC DIAG ANGIO  05/06/2017  . IR US GUIDE VASC ACCESS LEFT  05/06/2017  . IR US GUIDE VASC ACCESS RIGHT  05/06/2017  . IR US GUIDE VASC ACCESS RIGHT  05/06/2017  . PORTA CATH INSERTION N/A 11/23/2016   Procedure: Shelda Pal Cath Insertion;  Surgeon: Annice Needy, MD;  Location: [facility] INVASIVE CV LAB;  Service: Cardiovascular;  Laterality: N/A;  . RADIOLOGY WITH ANESTHESIA N/A 05/06/2017   Procedure: IR WITH ANESTHESIA;  Surgeon: Radiologist, Medication, MD;  Location: [facility] OR;  Service: Radiology;  Laterality: N/A;  . TEE WITHOUT CARDIOVERSION N/A 05/10/2017   Procedure: TRANSESOPHAGEAL ECHOCARDIOGRAM (TEE);  Surgeon: Thurmon Fair, MD;  Location: [facility] ENDOSCOPY;  Service: Cardiovascular;  Laterality: N/A;  . TONSILLECTOMY       Social History:   reports that he quit smoking about 19 years ago. His smoking use included cigarettes. He started smoking about 69 years ago. He has  a 50 pack-year smoking history. He has never used smokeless tobacco. He reports that he does not drink alcohol and does not use drugs.   Family History:  His family history includes Lung cancer (age of onset: 85) in his sister; Ovarian cancer in his sister; Skin cancer in his mother and sister; Throat cancer (age of onset: 66) in his brother; Throat cancer (age of onset: 85) in his brother.   Allergies Allergies  Allergen Reactions  . Penicillin G Anaphylaxis    Other reaction(s): Other (See Comments) Couldn't breath among other things Has patient had a PCN reaction causing immediate rash, facial/tongue/throat swelling, SOB or lightheadedness with hypotension: Yes Has patient had a PCN reaction causing severe rash involving mucus membranes or skin necrosis: No Has patient had a PCN reaction that required hospitalization: Yes Has patient had a PCN reaction occurring within the last 10 years: No If all of the above answers are "NO", then may proceed with Ce  . Shellfish-Derived Products Anaphylaxis  . Statins     Other reaction(s): Other (See Comments) Muscle spasms - can't walk - couldn't turn over in bed  . Erythromycin Itching    All mycins  . Tamsulosin     Other reaction(s): Dizziness  . Tuberculin Ppd     Other reaction(s): Other (See Comments) False test   . Iodinated Contrast Media Hives     Home Medications  Prior to Admission medications   Medication Sig Start Date End Date Taking? Authorizing Provider  Apple Cider Vinegar 188 MG CAPS Take by mouth.    [provider]  aspirin 81 MG chewable tablet Chew 4 tablets (324 mg total) by mouth daily. Patient taking differently: Chew 81 mg by mouth daily. 06/15/17   Love, Evlyn Kanner, PA-C  blood glucose meter kit and supplies KIT Dispense based on patient and insurance preference. Use up to four times daily as directed. ICD: E11.9 06/15/17   Love, Evlyn Kanner, PA-C  citalopram (CELEXA) 10 MG tablet Take 10 mg by mouth daily.     [provider]  colchicine 0.6 MG tablet Take by mouth. 05/29/22   [provider]  ezetimibe (ZETIA) 10 MG tablet Take 10 mg by mouth daily. 06/08/23   [provider]  Fluticasone-Umeclidin-Vilant (TRELEGY ELLIPTA) 100-62.5-25 MCG/ACT AEPB Inhale 1 puff into the lungs daily. 06/16/23   Assaker, [place], MD  Fluticasone-Umeclidin-Vilant (TRELEGY ELLIPTA) 100-62.5-25 MCG/ACT AEPB Inhale 1 puff into the lungs daily. 06/16/23   Janann Colonel, MD  Garlic 2 MG CAPS Take by mouth.    [provider]  glimepiride (AMARYL) 4 MG tablet Take 1 tablet (4 mg total) by mouth daily with breakfast. Patient taking differently: Take 2 mg by  mouth 2 (two) times daily. 06/15/17   Love, Evlyn Kanner, PA-C  insulin glargine (LANTUS) 100 UNIT/ML injection Inject 10-12 Units into the skin daily.    [provider]  Omega-3 Fatty Acids (OMEGA-3 FISH OIL PO) Take by mouth.    [provider]  pantoprazole (PROTONIX) 40 MG tablet Take 1 tablet (40 mg total) by mouth daily. 06/15/17   Love, Evlyn Kanner, PA-C  protein supplement shake (PREMIER PROTEIN) LIQD Take 325 mLs (11 oz total) by mouth 4 (four) times daily. 06/14/17   Love, Evlyn Kanner, PA-C  senna-docusate (SENOKOT-S) 8.6-50 MG tablet Take 1 tablet by mouth at bedtime as needed for mild constipation. 05/11/17   Layne Benton, NP       DVT/GI PRX  assessed I Assessed the need for Labs I Assessed the need for Foley I Assessed the need for Central Venous Line Family Discussion when available I Assessed the need for Mobilization I made an Assessment of medications to be adjusted accordingly Safety Risk assessment completed  CASE DISCUSSED IN MULTIDISCIPLINARY ROUNDS WITH ICU TEAM     Critical Care Time devoted to patient care services described in this note is *** minutes.   Critical care was necessary to treat /prevent imminent and life-threatening deterioration.   PATIENT WITH VERY POOR PROGNOSIS I ANTICIPATE  PROLONGED ICU LOS  Patient is critically ill. Patient with Multiorgan failure and at high risk for cardiac arrest and death.    Edwin Jones, ANP-BC Pulmonary and Critical Care Medicine Prefer epic messenger for cross cover needs or call ICU at [phone] If after hours, please call E-link.  NB: This document was prepared using Dragon voice recognition software and may include unintentional dictation errors.

## 2023-06-18 NOTE — ED Notes (Signed)
 Report given to RN

## 2023-06-18 NOTE — Progress Notes (Signed)
 Pharmacy Antibiotic Note  Edwin Jones is a 83 y.o. male admitted on 06/18/2023 with pneumonia. Patient presenting with shortness of breath. PMH significant for COPD prior small cell lung cancer, DM 2, HTN. Pharmacy has been consulted for vancomycin dosing.  Plan: Give vancomycin load of 2000 mg IV x1 Will dose vancomycin per levels - check ~24 hour vancomycin random level Start ceftriaxone 1 g IV every 24 hours (Per MD, okay with challenging penicillin allergy given patient is intubated) Start azithromycin 500 mg IV every 24 hours per MD Monitor renal function, clinical status, culture data, and LOT F/u MRSA PCR and de-escalate antibiotics as able  Height: 6\' 3"  (190.5 cm) Weight: 95.3 kg (210 lb) IBW/kg (Calculated) : 84.5  Temp (24hrs), Avg:99 F (37.2 C), Min:98.5 F (36.9 C), Max:99.3 F (37.4 C)  Recent Labs  Lab 06/18/23 1717  WBC 19.2*  CREATININE 1.92*  LATICACIDVEN 4.7*    Estimated Creatinine Clearance: 34.8 mL/min (A) (by C-G formula based on SCr of 1.92 mg/dL (H)).    Allergies  Allergen Reactions   Penicillin G Anaphylaxis    Other reaction(s): Other (See Comments) Couldn't breath among other things Has patient had a PCN reaction causing immediate rash, facial/tongue/throat swelling, SOB or lightheadedness with hypotension: Yes Has patient had a PCN reaction causing severe rash involving mucus membranes or skin necrosis: No Has patient had a PCN reaction that required hospitalization: Yes Has patient had a PCN reaction occurring within the last 10 years: No If all of the above answers are "NO", then may proceed with Ce   Shellfish-Derived Products Anaphylaxis   Statins     Other reaction(s): Other (See Comments) Muscle spasms - can't walk - couldn't turn over in bed   Erythromycin Itching    All mycins   Tamsulosin     Other reaction(s): Dizziness   Tuberculin Ppd     Other reaction(s): Other (See Comments) False test    Iodinated Contrast Media Hives    Antimicrobials this admission: vancomycin 4/11 >>  levofloxacin 4/11 x1  Dose adjustments this admission: N/A  Microbiology results: 4/11 BCx: pending 4/11 Sputum: pending  4/11 MRSA PCR: pending  Thank you for involving pharmacy in this patient's care.   Rockwell Alexandria, PharmD Clinical Pharmacist 06/18/2023 8:59 PM

## 2023-06-18 NOTE — ED Notes (Addendum)
 20 of etomidate given by RN hayley

## 2023-06-18 NOTE — ED Notes (Signed)
 Pt being intubated by MD Wells at this time. Tube in placed, 23 at teeth, good color change.

## 2023-06-18 NOTE — Progress Notes (Signed)
 CODE SEPSIS - PHARMACY COMMUNICATION  **Broad Spectrum Antibiotics should be administered within 1 hour of Sepsis diagnosis**  Time Code Sepsis Called/Page Received: 1721  Antibiotics Ordered: Levofloxacin  Time of 1st antibiotic administration: 1748  Additional action taken by pharmacy: N/A  Tressie Ellis 06/18/2023  5:21 PM

## 2023-06-18 NOTE — ED Notes (Signed)
 Pt had large loose/formed BM that was light brown. Pt has some skin bliatering to inner thigh area. Pt cleaned and clean brief placed. Pt placed in gown. Pt pulled up in bed. Purewick placed

## 2023-06-18 NOTE — ED Notes (Signed)
 All meds given , pt being bagged and prepared for intubation.

## 2023-06-18 NOTE — ED Notes (Addendum)
 Nore/levo started at 

## 2023-06-18 NOTE — ED Notes (Signed)
 Wife called out that pt was throwing up. Pt spitting up large foamy mucus. Pt cleaned up and taken off bipap, MD aware and pt placed on 6L [place] to see how he does. Wife at bedside

## 2023-06-18 NOTE — H&P (Signed)
 NAME:  Edwin Jones, MRN:  [id number], DOB:  01-Jun-1940, LOS: 1 ADMISSION DATE:  06/18/2023 CONSULTATION DATE: 06/18/2023 REFERRING MD: Dr. Derrill Kay CHIEF COMPLAINT: Pneumonia, sepsis, septic shock and respiratory failure  BRIEF SYNOPSIS: 83 year old male presenting with shortness of breath, productive cough, found to have community-acquired pneumonia, sepsis and septic shock, failed BiPAP and was emergently intubated.  History of Present Illness:  Edwin Jones is an 83 year old African-American male with a history of type 2 diabetes, hypertension, small cell lung cancer, and COPD who presented to the ED via EMS with complaints of respiratory distress.  History is obtained from EMS records as well as ED records as patient is currently intubated and sedated and there is no family at the bedside.  Per ED records, EMS was called for respiratory distress.  Upon EMS arrival, patient's oxygen saturation was 60%.  Bag mask ventilation was started and patient was transported to the ED after an unsuccessful intubation attempt in the field.  Upon ED arrival, patient had copious amounts of secretions in his oral cavity.  Initial ED workup revealed multifocal pneumonia with sepsis.  Patient was placed on BiPAP however he became combative and started coughing profusely and also had large amounts of secretions that filled up the BiPAP mask.  His oxygen saturation dropped and he was emergently intubated.  PCCM was consulted for further management. ED course: His initial venous gas in the ED showed a pH of 7.25, PCO2 of 49, and PaO2 of 54.  Chemistry showed CO2 level of 19, glucose level of 360 mg/dL, BUN of 35, and a creatinine of 1.90 which is close to his baseline.  CBC showed a WBC of 19.2 platelets of 426, neutrophils of 15.0.  His respiratory panel was negative for influenza and COVID as well as a RSV.  His chest x-ray showed multifocal pneumonia.  He was started on broad-spectrum antibiotics and fluid resuscitation  was also initiated.  Postintubation, patient became hypotensive requiring pressors.  He was started on peripheral norepinephrine. Upon arrival in the ICU, patient was intubated and sedated.  His initial troponin was 96 however the second troponin peaked to 4173.  Lactic acid was 4.7 and peaked at 5.1.  He was started on a heparin infusion and cardiology was consulted.  A 2D echo was ordered.  He was also given multiple fluid boluses.  Pertinent  Medical History   Past Medical History:  Diagnosis Date   Cancer (HCC)    COPD (chronic obstructive pulmonary disease) (HCC)    Diabetes mellitus without complication (HCC)    GERD without esophagitis    Hepatitis C virus    history of hep C treated with Harvoni   Hypercholesterolemia    Hypertension    Seasonal allergies     Past Surgical History:  Procedure Laterality Date   COLONOSCOPY     approximately 11 years ago   FLEXIBLE BRONCHOSCOPY N/A 11/05/2016   Procedure: FLEXIBLE BRONCHOSCOPY;  Surgeon: Merwyn Katos, MD;  Location: [facility] ORS;  Service: Pulmonary;  Laterality: N/A;   IR FLUORO GUIDE CV LINE RIGHT  05/06/2017   IR INTRAVSC STENT CERV CAROTID W/O EMB-PROT MOD SED INC ANGIO  05/06/2017   IR PERCUTANEOUS ART THROMBECTOMY/INFUSION INTRACRANIAL INC DIAG ANGIO  05/06/2017   IR US GUIDE VASC ACCESS LEFT  05/06/2017   IR US GUIDE VASC ACCESS RIGHT  05/06/2017   IR US GUIDE VASC ACCESS RIGHT  05/06/2017   PORTA CATH INSERTION N/A 11/23/2016   Procedure: Shelda Pal Cath Insertion;  Surgeon:  Celso College, MD;  Location: [facility] INVASIVE CV LAB;  Service: Cardiovascular;  Laterality: N/A;   RADIOLOGY WITH ANESTHESIA N/A 05/06/2017   Procedure: IR WITH ANESTHESIA;  Surgeon: Radiologist, Medication, MD;  Location: [facility] OR;  Service: Radiology;  Laterality: N/A;   TEE WITHOUT CARDIOVERSION N/A 05/10/2017   Procedure: TRANSESOPHAGEAL ECHOCARDIOGRAM (TEE);  Surgeon: Luana Rumple, MD;  Location: [facility] ENDOSCOPY;  Service: Cardiovascular;  Laterality: N/A;    TONSILLECTOMY      Social History   Socioeconomic History   Marital status: Married    Spouse name: Not on file   Number of children: Not on file   Years of education: Not on file   Highest education level: Not on file  Occupational History   Not on file  Tobacco Use   Smoking status: Former    Current packs/day: 0.00    Average packs/day: 1 pack/day for 50.0 years (50.0 ttl pk-yrs)    Types: Cigarettes    Start date: 04/07/1954    Quit date: 04/07/2004    Years since quitting: 19.2   Smokeless tobacco: Never  Vaping Use   Vaping status: Not on file  Substance and Sexual Activity   Alcohol use: No    Comment: history of alcohol use   Drug use: No   Sexual activity: Yes  Other Topics Concern   Not on file  Social History Narrative   Not on file   Social Drivers of Health   Financial Resource Strain: Not on file  Food Insecurity: Patient Unable To Answer (06/19/2023)   Hunger Vital Sign    Worried About Running Out of Food in the Last Year: Patient unable to answer    Ran Out of Food in the Last Year: Patient unable to answer  Transportation Needs: Patient Unable To Answer (06/19/2023)   PRAPARE - Transportation    Lack of Transportation (Medical): Patient unable to answer    Lack of Transportation (Non-Medical): Patient unable to answer  Physical Activity: Not on file  Stress: Not on file  Social Connections: Patient Unable To Answer (06/19/2023)   Social Connection and Isolation Panel [NHANES]    Frequency of Communication with Friends and Family: Patient unable to answer    Frequency of Social Gatherings with Friends and Family: Patient unable to answer    Attends Religious Services: Patient unable to answer    Active Member of Clubs or Organizations: Patient unable to answer    Attends Banker Meetings: Patient unable to answer    Marital Status: Patient unable to answer  Intimate Partner Violence: Patient Unable To Answer (06/19/2023)   Humiliation,  Afraid, Rape, and Kick questionnaire    Fear of Current or Ex-Partner: Patient unable to answer    Emotionally Abused: Patient unable to answer    Physically Abused: Patient unable to answer    Sexually Abused: Patient unable to answer     Significant Hospital Events: Including procedures, antibiotic start and stop dates in addition to other pertinent events   06/18/23: Admitted with multifocal pneumonia, sepsis and septic shock requiring intubations  Micro Data:   Results for orders placed or performed during the hospital encounter of 06/18/23  Culture, blood (Routine x 2)     Status: None (Preliminary result)   Collection Time: 06/18/23  5:18 PM   Specimen: BLOOD  Result Value Ref Range Status   Specimen Description BLOOD BLOOD RIGHT FOREARM  Final   Special Requests   Final    BOTTLES DRAWN AEROBIC  AND ANAEROBIC Blood Culture results may not be optimal due to an inadequate volume of blood received in culture bottles   Culture   Final    NO GROWTH < 24 HOURS Performed at [facility], [address] [place], [place] [address]    Report Status PENDING  Incomplete  Culture, blood (Routine x 2)     Status: None (Preliminary result)   Collection Time: 06/18/23  5:18 PM   Specimen: BLOOD  Result Value Ref Range Status   Specimen Description BLOOD BLOOD RIGHT ARM  Final   Special Requests   Final    BOTTLES DRAWN AEROBIC AND ANAEROBIC Blood Culture results may not be optimal due to an inadequate volume of blood received in culture bottles   Culture  Setup Time   Final    Organism ID to follow GRAM POSITIVE COCCI AEROBIC BOTTLE ONLY CRITICAL RESULT CALLED TO, READ BACK BY AND VERIFIED WITH: Performed at [facility], [address] [place], [place] [address]    Culture GRAM POSITIVE COCCI  Final   Report Status PENDING  Incomplete  Resp panel by RT-PCR (RSV, Flu A&B, Covid) Anterior Nasal Swab     Status: None   Collection Time: 06/18/23  5:25 PM    Specimen: Anterior Nasal Swab  Result Value Ref Range Status   SARS Coronavirus 2 by RT PCR NEGATIVE NEGATIVE Final    Comment: (NOTE) SARS-CoV-2 target nucleic acids are NOT DETECTED.  The SARS-CoV-2 RNA is generally detectable in upper respiratory specimens during the acute phase of infection. The lowest concentration of SARS-CoV-2 viral copies this assay can detect is 138 copies/mL. A negative result does not preclude SARS-Cov-2 infection and should not be used as the sole basis for treatment or other patient management decisions. A negative result may occur with  improper specimen collection/handling, submission of specimen other than nasopharyngeal swab, presence of viral mutation(s) within the areas targeted by this assay, and inadequate number of viral copies(<138 copies/mL). A negative result must be combined with clinical observations, patient history, and epidemiological information. The expected result is Negative.  Fact Sheet for Patients:  BloggerCourse.com  Fact Sheet for Healthcare Providers:  SeriousBroker.it  This test is no t yet approved or cleared by the [place]  FDA and  has been authorized for detection and/or diagnosis of SARS-CoV-2 by FDA under an Emergency Use Authorization (EUA). This EUA will remain  in effect (meaning this test can be used) for the duration of the COVID-19 declaration under Section 564(b)(1) of the Act, 21 U.S.C.section 360bbb-3(b)(1), unless the authorization is terminated  or revoked sooner.       Influenza A by PCR NEGATIVE NEGATIVE Final   Influenza B by PCR NEGATIVE NEGATIVE Final    Comment: (NOTE) The Xpert Xpress SARS-CoV-2/FLU/RSV plus assay is intended as an aid in the diagnosis of influenza from Nasopharyngeal swab specimens and should not be used as a sole basis for treatment. Nasal washings and aspirates are unacceptable for Xpert Xpress  SARS-CoV-2/FLU/RSV testing.  Fact Sheet for Patients: BloggerCourse.com  Fact Sheet for Healthcare Providers: SeriousBroker.it  This test is not yet approved or cleared by the [place]  FDA and has been authorized for detection and/or diagnosis of SARS-CoV-2 by FDA under an Emergency Use Authorization (EUA). This EUA will remain in effect (meaning this test can be used) for the duration of the COVID-19 declaration under Section 564(b)(1) of the Act, 21 U.S.C. section 360bbb-3(b)(1), unless the authorization is terminated or revoked.  Resp Syncytial Virus by PCR NEGATIVE NEGATIVE Final    Comment: (NOTE) Fact Sheet for Patients: BloggerCourse.com  Fact Sheet for Healthcare Providers: SeriousBroker.it  This test is not yet approved or cleared by the [place]  FDA and has been authorized for detection and/or diagnosis of SARS-CoV-2 by FDA under an Emergency Use Authorization (EUA). This EUA will remain in effect (meaning this test can be used) for the duration of the COVID-19 declaration under Section 564(b)(1) of the Act, 21 U.S.C. section 360bbb-3(b)(1), unless the authorization is terminated or revoked.  Performed at [facility], [address] [place], [place] [address]   MRSA Next Gen by PCR, Nasal     Status: None   Collection Time: 06/18/23 10:06 PM   Specimen: Nasal Mucosa; Nasal Swab  Result Value Ref Range Status   MRSA by PCR Next Gen NOT DETECTED NOT DETECTED Final    Comment: (NOTE) The GeneXpert MRSA Assay (FDA approved for NASAL specimens only), is one component of a comprehensive MRSA colonization surveillance program. It is not intended to diagnose MRSA infection nor to guide or monitor treatment for MRSA infections. Test performance is not FDA approved in patients less than 3 years old. Performed at [facility], [address] [place], [place] [address]     Antimicrobials:  Vacomycin, ceftriaxone and azithromycin   Interim History / Subjective:  As above  Objective   Blood pressure 120/70, pulse 70, temperature 97.6 F (36.4 C), temperature source Oral, resp. rate 18, height 6\' 3"  (1.905 m), weight 87.7 kg, SpO2 96%.    Vent Mode: PRVC FiO2 (%):  [40 %-100 %] 40 % Set Rate:  [18 bmp] 18 bmp Vt Set:  [550 mL] 550 mL PEEP:  [8 cmH20] 8 cmH20 Plateau Pressure:  [24 cmH20] 24 cmH20   Intake/Output Summary (Last 24 hours) at 06/19/2023 0935 Last data filed at 06/19/2023 0800 Gross per 24 hour  Intake 5688.94 ml  Output 375 ml  Net 5313.94 ml   Filed Weights   06/18/23 1710 06/19/23 0515  Weight: 95.3 kg 87.7 kg   REVIEW OF SYSTEMS PATIENT IS UNABLE TO PROVIDE COMPLETE REVIEW OF SYSTEMS DUE TO SEVERE CRITICAL ILLNESS; intubated and sedated  PHYSICAL EXAMINATION: GENERAL:critically ill appearing, +resp distress EYES: Pupils equal, round, reactive to light.  No scleral icterus.  MOUTH: Moist mucosal membrane; ETT tube in place, no air leaks NECK: Supple, positive range of motion, no JVD PULMONARY: Breath sounds diminished in all lung fields, +rhonchi, +wheezing CARDIOVASCULAR: apical pulse regular, S1 and S2, no murmurs regurg or gallop.  GASTROINTESTINAL: Soft, nontender, nondistended, bowel sounds hypoactive MUSCULOSKELETAL: No swelling, clubbing, or edema.  No joint deformities NEUROLOGIC: Intubated and sedated, withdraws to noxious stimulus SKIN:normal, warm to touch, Capillary refill delayed  Neuro: pulses present bilaterally, gag reflex intact  Resolved Hospital Problem list   None  Labs/imaging that I havepersonally reviewed  (right click and "Reselect all SmartList Selections" daily)    ASSESSMENT AND PLAN #1 Respiratory Acute hypoxic respiratory failure Community acquired pneumonia -Full vent support with current settings - VAP protocol - Chest x-ray, ABG and continuous SpO2  monitoring as needed - Broad-spectrum antibiotics with vancomycin, ceftriaxone, azithromycin - Monitor respiratory status closely - Urine Legionella and strep pneumonia - Spontaneous breathing assessments and spontaneous awakening assessments as tolerated -Bronchodilators as tolerated - Wean FiO2 and PEEP as tolerated  #Cardiovascular NSTEMI-Troponin trending up from 96 to 4173 Septic shock due to pneumonia Acute COPD exacerbation due to pneumonia -Hemodynamic monitoring  per ICU protocol -Fluid resuscitation and pressors as ordered, titrate to maintain mean arterial blood pressure 65 and above - Cardiology consult for elevated troponins - 2D echocardiogram in the morning -Heparin infusion per pharmacy -Monitor for bleeding - Patient has a right chest wall Mediport.  Mediport accessed using aseptic technique.  New dressing placed.  Vent Mode: PRVC FiO2 (%):  [40 %-100 %] 40 % Set Rate:  [18 bmp] 18 bmp Vt Set:  [550 mL] 550 mL PEEP:  [8 cmH20] 8 cmH20 Plateau Pressure:  [24 cmH20] 24 cmH20  Acute on chronic renal failure: BPH-Foley difficult to insert -continue Foley Catheter-assess need -Avoid nephrotoxic agents -Follow urine output, BMP -Ensure adequate renal perfusion, optimize oxygenation -Renal dose medications  Intake/Output Summary (Last 24 hours) at 06/19/2023 0935 Last data filed at 06/19/2023 0800 Gross per 24 hour  Intake 5688.94 ml  Output 375 ml  Net 5313.94 ml    NEUROLOGY Acute  metabolic encephalopathy, need for sedation -Goal RASS 0 to -1 -Continue to monitor mental status and reorient as needed  SEPTIC SHOCK due to pneumonia -Norepinephrine and titrate to keep MAP>65 as needed -follow ABG and LA -follow up cultures -emperic ABX -consider stress dose steroids -aggressive IV fluid resuscitation  INFECTIOUS DISEASE -continue antibiotics as prescribed -follow up cultures -follow up ID consultation  Type 2 diabetes mellitus with hyperglycemia -  ICU hypoglycemic\Hyperglycemia protocol -check FSBS per protocol - Consider basal insulin coverage if patient's blood sugar remains consistently greater than 200 mg/dL  Best practice (right click and "Reselect all SmartList Selections" daily)  Diet: NPO Pain/Anxiety/Delirium protocol (if indicated): Yes (RASS goal -1,-2) VAP protocol (if indicated): Yes DVT prophylaxis: Subcutaneous Heparin GI prophylaxis: H2B Glucose control:  SSI Yes Central venous access:  Yes, and it is still needed Arterial line:  N/A Foley:  Yes, and it is still needed Mobility:  bed rest  Code Status:  FULL Disposition:ICU  Labs   CBC: Recent Labs  Lab 06/18/23 1717 06/19/23 0452  WBC 19.2* 21.4*  NEUTROABS 15.0*  --   HGB 12.8* 11.9*  HCT 40.1 36.3*  MCV 97.3 96.3  PLT 426* 398    Basic Metabolic Panel: Recent Labs  Lab 06/18/23 1717 06/19/23 0452  NA 142 141  K 4.1 4.2  CL 108 107  CO2 19* 21*  GLUCOSE 316* 278*  BUN 35* 35*  CREATININE 1.92* 1.95*  CALCIUM 8.8* 8.4*  MG 2.0 1.6*  PHOS 5.7* 3.2   GFR: Estimated Creatinine Clearance: 34.3 mL/min (A) (by C-G formula based on SCr of 1.95 mg/dL (H)). Recent Labs  Lab 06/18/23 1717 06/18/23 2218 06/18/23 2342 06/19/23 0452 06/19/23 0837  PROCALCITON  --  1.60  --   --   --   WBC 19.2*  --   --  21.4*  --   LATICACIDVEN 4.7* 4.6* 5.1*  --  5.6*    Liver Function Tests: Recent Labs  Lab 06/18/23 1717  AST 20  ALT 10  ALKPHOS 62  BILITOT 0.6  PROT 7.8  ALBUMIN 3.1*   No results for input(s): "LIPASE", "AMYLASE" in the last 168 hours. No results for input(s): "AMMONIA" in the last 168 hours.  ABG    Component Value Date/Time   PHART 7.31 (L) 06/19/2023 0258   PCO2ART 41 06/19/2023 0258   PO2ART 83 06/19/2023 0258   HCO3 20.7 06/19/2023 0258   TCO2 25 05/06/2017 0329   ACIDBASEDEF 5.5 (H) 06/19/2023 0258   O2SAT 97.1 06/19/2023 0258  Coagulation Profile: Recent Labs  Lab 06/18/23 2218  INR 1.2    Cardiac  Enzymes: No results for input(s): "CKTOTAL", "CKMB", "CKMBINDEX", "TROPONINI" in the last 168 hours.  HbA1C: Hgb A1c MFr Bld  Date/Time Value Ref Range Status  05/07/2017 05:00 AM 9.1 (H) 4.8 - 5.6 % Final    Comment:    (NOTE) Pre diabetes:          5.7%-6.4% Diabetes:              >6.4% Glycemic control for   <7.0% adults with diabetes     CBG: Recent Labs  Lab 06/18/23 1713 06/18/23 2045 06/18/23 2138 06/18/23 2338 06/19/23 0343  GLUCAP 292* 311* 289* 229* 201*     Past Medical History:  He,  has a past medical history of Cancer (HCC), COPD (chronic obstructive pulmonary disease) (HCC), Diabetes mellitus without complication (HCC), GERD without esophagitis, Hepatitis C virus, Hypercholesterolemia, Hypertension, and Seasonal allergies.   Surgical History:   Past Surgical History:  Procedure Laterality Date   COLONOSCOPY     approximately 11 years ago   FLEXIBLE BRONCHOSCOPY N/A 11/05/2016   Procedure: FLEXIBLE BRONCHOSCOPY;  Surgeon: Valiant Gaul, MD;  Location: [facility] ORS;  Service: Pulmonary;  Laterality: N/A;   IR FLUORO GUIDE CV LINE RIGHT  05/06/2017   IR INTRAVSC STENT CERV CAROTID W/O EMB-PROT MOD SED INC ANGIO  05/06/2017   IR PERCUTANEOUS ART THROMBECTOMY/INFUSION INTRACRANIAL INC DIAG ANGIO  05/06/2017   IR US  GUIDE VASC ACCESS LEFT  05/06/2017   IR US  GUIDE VASC ACCESS RIGHT  05/06/2017   IR US  GUIDE VASC ACCESS RIGHT  05/06/2017   PORTA CATH INSERTION N/A 11/23/2016   Procedure: Melville Stade Cath Insertion;  Surgeon: Celso College, MD;  Location: [facility] INVASIVE CV LAB;  Service: Cardiovascular;  Laterality: N/A;   RADIOLOGY WITH ANESTHESIA N/A 05/06/2017   Procedure: IR WITH ANESTHESIA;  Surgeon: Radiologist, Medication, MD;  Location: [facility] OR;  Service: Radiology;  Laterality: N/A;   TEE WITHOUT CARDIOVERSION N/A 05/10/2017   Procedure: TRANSESOPHAGEAL ECHOCARDIOGRAM (TEE);  Surgeon: Luana Rumple, MD;  Location: [facility] ENDOSCOPY;  Service: Cardiovascular;  Laterality: N/A;    TONSILLECTOMY       Social History:   reports that he quit smoking about 19 years ago. His smoking use included cigarettes. He started smoking about 69 years ago. He has a 50 pack-year smoking history. He has never used smokeless tobacco. He reports that he does not drink alcohol and does not use drugs.   Family History:  His family history includes Lung cancer (age of onset: 90) in his sister; Ovarian cancer in his sister; Skin cancer in his mother and sister; Throat cancer (age of onset: 32) in his brother; Throat cancer (age of onset: 44) in his brother.   Allergies Allergies  Allergen Reactions   Penicillin G Anaphylaxis    Other reaction(s): Other (See Comments) Couldn't breath among other things Has patient had a PCN reaction causing immediate rash, facial/tongue/throat swelling, SOB or lightheadedness with hypotension: Yes Has patient had a PCN reaction causing severe rash involving mucus membranes or skin necrosis: No Has patient had a PCN reaction that required hospitalization: Yes Has patient had a PCN reaction occurring within the last 10 years: No If all of the above answers are "NO", then may proceed with Ce   Shellfish-Derived Products Anaphylaxis   Statins     Other reaction(s): Other (See Comments) Muscle spasms - can't walk - couldn't turn over in bed  Erythromycin Itching    All mycins   Tamsulosin     Other reaction(s): Dizziness   Tuberculin Ppd     Other reaction(s): Other (See Comments) False test    Iodinated Contrast Media Hives     Home Medications  Prior to Admission medications   Medication Sig Start Date End Date Taking? Authorizing Provider  Apple Cider Vinegar 188 MG CAPS Take by mouth.    [provider]  aspirin 81 MG chewable tablet Chew 4 tablets (324 mg total) by mouth daily. Patient taking differently: Chew 81 mg by mouth daily. 06/15/17   Love, Renay Carota, PA-C  blood glucose meter kit and supplies KIT Dispense based on patient and  insurance preference. Use up to four times daily as directed. ICD: E11.9 06/15/17   Love, Renay Carota, PA-C  citalopram (CELEXA) 10 MG tablet Take 10 mg by mouth daily.    [provider]  colchicine 0.6 MG tablet Take by mouth. 05/29/22   [provider]  ezetimibe (ZETIA) 10 MG tablet Take 10 mg by mouth daily. 06/08/23   [provider]  Fluticasone-Umeclidin-Vilant (TRELEGY ELLIPTA) 100-62.5-25 MCG/ACT AEPB Inhale 1 puff into the lungs daily. 06/16/23   Assaker, Marianne Shirts, MD  Fluticasone-Umeclidin-Vilant (TRELEGY ELLIPTA) 100-62.5-25 MCG/ACT AEPB Inhale 1 puff into the lungs daily. 06/16/23   Annitta Kindler, MD  Garlic 2 MG CAPS Take by mouth.    [provider]  glimepiride (AMARYL) 4 MG tablet Take 1 tablet (4 mg total) by mouth daily with breakfast. Patient taking differently: Take 2 mg by mouth 2 (two) times daily. 06/15/17   Love, Renay Carota, PA-C  insulin glargine (LANTUS) 100 UNIT/ML injection Inject 10-12 Units into the skin daily.    [provider]  Omega-3 Fatty Acids (OMEGA-3 FISH OIL PO) Take by mouth.    [provider]  pantoprazole (PROTONIX) 40 MG tablet Take 1 tablet (40 mg total) by mouth daily. 06/15/17   Love, Renay Carota, PA-C  protein supplement shake (PREMIER PROTEIN) LIQD Take 325 mLs (11 oz total) by mouth 4 (four) times daily. 06/14/17   Love, Renay Carota, PA-C  senna-docusate (SENOKOT-S) 8.6-50 MG tablet Take 1 tablet by mouth at bedtime as needed for mild constipation. 05/11/17   Rodman Clam, NP    CASE DISCUSSED IN MULTIDISCIPLINARY ROUNDS WITH elink md Dr. Leslee Rase. Critical Care Time devoted to patient care services described in this note is 65 minutes.   Critical care was necessary to treat /prevent imminent and life-threatening deterioration. Patient is critically ill. Patient with Multiorgan failure and at high risk for cardiac arrest and death.    Lorie Cleckley S. Tukov-Yual, ANP-BC Pulmonary and Critical Care  Medicine Prefer epic messenger for cross cover needs or call ICU at [phone] If after hours, please call E-link.  NB: This document was prepared using Dragon voice recognition software and may include unintentional dictation errors.

## 2023-06-18 NOTE — ED Provider Notes (Signed)
 [facility] Provider Note    Event Date/Time   First MD Initiated Contact with Patient 06/18/23 1718     (approximate)   History   Respiratory Distress   HPI Edwin Jones is a 83 y.o. male with history of COPD prior small cell lung cancer, DM 2, HTN presenting today for shortness of breath.  Wife notes over the past couple days he has had worsening cough and congestion.  Today he was very congested and started having worsening shortness of breath.  Was unable to fully cough up phlegm that was stuck in his throat.  EMS was called.  Was found to be hypoxic in the 60s on their arrival and they started bagging him.  Patient admits to shortness of breath, cough, and congestion.  No fevers that he is aware of.  Denies chest pain, abdominal pain, nausea, vomiting, leg pain, leg swelling.     Physical Exam   Triage Vital Signs: ED Triage Vitals  Encounter Vitals Group     BP 06/18/23 1720 (!) 85/63     Systolic BP Percentile --      Diastolic BP Percentile --      Pulse Rate 06/18/23 1720 92     Resp 06/18/23 1720 (!) 35     Temp 06/18/23 1720 99.1 F (37.3 C)     Temp Source 06/18/23 1720 Axillary     SpO2 06/18/23 1709 (!) 83 %     Weight 06/18/23 1710 210 lb (95.3 kg)     Height 06/18/23 1710 6\' 3"  (1.905 m)     Head Circumference --      Peak Flow --      Pain Score 06/18/23 1710 0     Pain Loc --      Pain Education --      Exclude from Growth Chart --     Most recent vital signs: Vitals:   06/18/23 1915 06/18/23 1927  BP: 139/81   Pulse: (!) 120   Resp: (!) 27   Temp:    SpO2: 93% 92%   Physical Exam: I have reviewed the vital signs and nursing notes. General: Awake, alert, respiratory distress. Head:  Atraumatic, normocephalic.   ENT:  EOM intact, PERRL. Oral mucosa is pink and moist with no lesions. Neck: Neck is supple with full range of motion, No meningeal signs. Cardiovascular:  RRR, No murmurs. Peripheral pulses palpable and  equal bilaterally. Respiratory:  Symmetrical chest wall expansion.  Significant rhonchi and crackles throughout.  No obvious wheezing.  Tachypnea with accessory muscle usage. Musculoskeletal:  No cyanosis or edema. Moving extremities with full ROM Abdomen:  Soft, nontender, nondistended. Neuro:  GCS 14, moving all four extremities Psych:  Calm, appropriate.   Skin:  Warm, dry, no rash.    ED Results / Procedures / Treatments   Labs (all labs ordered are listed, but only abnormal results are displayed) Labs Reviewed  COMPREHENSIVE METABOLIC PANEL WITH GFR - Abnormal; Notable for the following components:      Result Value   CO2 19 (*)    Glucose, Bld 316 (*)    BUN 35 (*)    Creatinine, Ser 1.92 (*)    Calcium 8.8 (*)    Albumin 3.1 (*)    GFR, Estimated 34 (*)    All other components within normal limits  LACTIC ACID, PLASMA - Abnormal; Notable for the following components:   Lactic Acid, Venous 4.7 (*)    All other components within  normal limits  CBC WITH DIFFERENTIAL/PLATELET - Abnormal; Notable for the following components:   WBC 19.2 (*)    RBC 4.12 (*)    Hemoglobin 12.8 (*)    Platelets 426 (*)    Neutro Abs 15.0 (*)    Monocytes Absolute 1.3 (*)    Abs Immature Granulocytes 0.14 (*)    All other components within normal limits  BLOOD GAS, VENOUS - Abnormal; Notable for the following components:   pO2, Ven 54 (*)    Acid-base deficit 6.0 (*)    All other components within normal limits  CBG MONITORING, ED - Abnormal; Notable for the following components:   Glucose-Capillary 292 (*)    All other components within normal limits  TROPONIN I (HIGH SENSITIVITY) - Abnormal; Notable for the following components:   Troponin I (High Sensitivity) 96 (*)    All other components within normal limits  CULTURE, BLOOD (ROUTINE X 2)  CULTURE, BLOOD (ROUTINE X 2)  RESP PANEL BY RT-PCR (RSV, FLU A&B, COVID)  RVPGX2  EXPECTORATED SPUTUM ASSESSMENT W GRAM STAIN, RFLX TO RESP C   BRAIN NATRIURETIC PEPTIDE  LACTIC ACID, PLASMA  URINALYSIS, W/ REFLEX TO CULTURE (INFECTION SUSPECTED)  PROTIME-INR     EKG My EKG interpretation: Rate of 128, sinus tachycardia, normal axis, normal intervals.  No acute ST elevations or depressions   RADIOLOGY Independently read chest x-ray with evidence of multifocal pneumonia   PROCEDURES:  Critical Care performed: Yes, see critical care procedure note(s)  .Critical Care  Performed by: Janith Lima, MD Authorized by: Janith Lima, MD   Critical care provider statement:    Critical care time (minutes):  60   Critical care was necessary to treat or prevent imminent or life-threatening deterioration of the following conditions:  Respiratory failure and sepsis   Critical care was time spent personally by me on the following activities:  Development of treatment plan with patient or surrogate, discussions with consultants, evaluation of patient's response to treatment, examination of patient, ordering and review of laboratory studies, ordering and review of radiographic studies, ordering and performing treatments and interventions, pulse oximetry, re-evaluation of patient's condition and review of old charts   I assumed direction of critical care for this patient from another provider in my specialty: no     Care discussed with: admitting provider   Procedure Name: Intubation Date/Time: 06/18/2023 7:46 PM  Performed by: Janith Lima, MDPre-anesthesia Checklist: Patient identified, Patient being monitored, Emergency Drugs available, Timeout performed and Suction available Oxygen Delivery Method: Non-rebreather mask Preoxygenation: Pre-oxygenation with 100% oxygen Induction Type: Rapid sequence Ventilation: Mask ventilation without difficulty Laryngoscope Size: Glidescope and 4 Grade View: Grade I Tube size: 8.0 mm Number of attempts: 1 Airway Equipment and Method: Stylet and Video-laryngoscopy Placement Confirmation: ETT  inserted through vocal cords under direct vision, CO2 detector and Breath sounds checked- equal and bilateral Secured at: 23 cm Tube secured with: ETT holder Difficulty Due To: Difficulty was unanticipated       MEDICATIONS ORDERED IN ED: Medications  fentaNYL in NS (74mcg/ml) infusion-PREMIX (50 mcg/hr Intravenous Rate/Dose Change 06/18/23 1914)  propofol (DIPRIVAN) 1000 MG/100ML infusion (25 mcg/kg/min  95.3 kg Intravenous Rate/Dose Change 06/18/23 1919)  lactated ringers bolus 1,000 mL (0 mLs Intravenous Stopped 06/18/23 1853)  lactated ringers bolus 1,000 mL (0 mLs Intravenous Stopped 06/18/23 1853)  levofloxacin (LEVAQUIN) IVPB 750 mg (0 mg Intravenous Stopped 06/18/23 1918)  norepinephrine (LEVOPHED) 4-5 MG/250ML-% infusion SOLN (5 mcg/kg/min  Restarted 06/18/23 1940)  etomidate (AMIDATE) injection 20 mg (20 mg Intravenous Given 06/18/23 1842)  succinylcholine (ANECTINE) syringe 100 mg (100 mg Intravenous Given 06/18/23 1842)     IMPRESSION / MDM / ASSESSMENT AND PLAN / ED COURSE  I reviewed the triage vital signs and the nursing notes.                              Differential diagnosis includes, but is not limited to, pneumonia, COVID, flu, RSV, COPD exacerbation, lower concern for heart failure exacerbation  Patient's presentation is most consistent with acute presentation with potential threat to life or bodily function.  Patient is an 83 year old male presenting today for acute respiratory distress.  Notably hypoxic on room air on arrival with saturations in the upper 70s.  Transition to BiPAP with improvement into the upper 90s.  On arrival he was tachycardic, tachypneic, and hypotensive with concern for respiratory infection.  Sepsis protocol initiated and started on levofloxacin given prior allergies with anaphylaxis to penicillins.  Patient's blood pressure responded appropriately to 2 L of fluid.  Lactic acid elevated at 4.7.  Leukocytosis at 19.2.  Chest x-ray  shows evidence of multifocal pneumonia.  Patient episode while on BiPAP of coughing up significant mucus and was started become more unresponsive.  Oxygen dropped to around 70 even on BiPAP.  No significant improvement with increasing the rate and FiO2.  Decision was made to intubate after discussing with wife.  Patient successfully intubated with improvement in his oxygen.  On propofol and fentanyl afterwards.  Slight hypotension and was started on Levophed which is currently at 2 mcg/min.  Patient admitted to the ICU for multifocal pneumonia.  The patient is on the cardiac monitor to evaluate for evidence of arrhythmia and/or significant heart rate changes. Clinical Course as of 06/18/23 1946  Fri Jun 18, 2023  1803 Lactic Acid, Venous(!!): 4.7 Will repeat in 2 hours following completion of fluids [DW]  1849 Patient with worsening respiratory status and unable to handle secretions resulting in hypoxia even on BiPAP.  Decision made to intubate at that time. [DW]    Clinical Course User Index [DW] Janith Lima, MD     FINAL CLINICAL IMPRESSION(S) / ED DIAGNOSES   Final diagnoses:  Acute hypoxic respiratory failure (HCC)  Multifocal pneumonia  Sepsis, due to unspecified organism, unspecified whether acute organ dysfunction present [facility])     Rx / DC Orders   ED Discharge Orders     None        Note:  This document was prepared using Dragon voice recognition software and may include unintentional dictation errors.   Janith Lima, MD 06/18/23 [phone]

## 2023-06-18 NOTE — ED Notes (Signed)
 Wife called out pt not breathing right. Pt placed on NRB and MD Wells called to bedside. Pt O2 in 60s. Pt placed back on bipap and RT called. Supplies being gathered to intubate pt. Pt not really responding at the moment. Wife taken to family wait room

## 2023-06-18 NOTE — ED Notes (Signed)
 Unable to get OG to past. Pt has been suctioned and still has copious amount of thick sputum present in airway. OG attempted twice

## 2023-06-18 NOTE — ED Notes (Signed)
 MD Anner Crete informed of 4.7 lactic result

## 2023-06-18 NOTE — ED Triage Notes (Signed)
 Pt arrives via EMs from home in respiratory distress. Pt O2 was in 60s and EMS started to bag pt and with O2 at 80-90s. Pt would open eyes. Attempted intubation with no success. Pt full of fluid.     BP 100/76 20 in left arm  Pt responding to MD Anner Crete

## 2023-06-18 NOTE — ED Notes (Signed)
 Dentures removed and placed in pink cup with pt sticker at bedside.

## 2023-06-19 ENCOUNTER — Inpatient Hospital Stay

## 2023-06-19 ENCOUNTER — Inpatient Hospital Stay
Admit: 2023-06-19 | Discharge: 2023-06-19 | Disposition: A | Discharge status: Home / Self Care | Attending: Adult Health | Admitting: Adult Health

## 2023-06-19 DIAGNOSIS — N179 Acute kidney failure, unspecified: Secondary | ICD-10-CM

## 2023-06-19 DIAGNOSIS — J9601 Acute respiratory failure with hypoxia: Secondary | ICD-10-CM

## 2023-06-19 DIAGNOSIS — J441 Chronic obstructive pulmonary disease with (acute) exacerbation: Secondary | ICD-10-CM

## 2023-06-19 DIAGNOSIS — I5031 Acute diastolic (congestive) heart failure: Secondary | ICD-10-CM | POA: Diagnosis not present

## 2023-06-19 DIAGNOSIS — J9602 Acute respiratory failure with hypercapnia: Secondary | ICD-10-CM

## 2023-06-19 DIAGNOSIS — I214 Non-ST elevation (NSTEMI) myocardial infarction: Secondary | ICD-10-CM

## 2023-06-19 DIAGNOSIS — R6521 Severe sepsis with septic shock: Secondary | ICD-10-CM

## 2023-06-19 DIAGNOSIS — G9341 Metabolic encephalopathy: Secondary | ICD-10-CM

## 2023-06-19 DIAGNOSIS — A419 Sepsis, unspecified organism: Principal | ICD-10-CM

## 2023-06-19 LAB — BLOOD GAS, ARTERIAL
Acid-base deficit: 5.5 mmol/L — ABNORMAL HIGH (ref 0.0–2.0)
Bicarbonate: 20.7 mmol/L (ref 20.0–28.0)
FIO2: 50 %
MECHVT: 550 mL
O2 Saturation: 97.1 %
PEEP: 8 cmH2O
Patient temperature: 36.3
RATE: 18 {breaths}/min
pCO2 arterial: 41 mmHg (ref 32–48)
pH, Arterial: 7.31 — ABNORMAL LOW (ref 7.35–7.45)
pO2, Arterial: 83 mmHg (ref 83–108)

## 2023-06-19 LAB — CBC
HCT: 36.3 % — ABNORMAL LOW (ref 39.0–52.0)
Hemoglobin: 11.9 g/dL — ABNORMAL LOW (ref 13.0–17.0)
MCH: 31.6 pg (ref 26.0–34.0)
MCHC: 32.8 g/dL (ref 30.0–36.0)
MCV: 96.3 fL (ref 80.0–100.0)
Platelets: 398 10*3/uL (ref 150–400)
RBC: 3.77 MIL/uL — ABNORMAL LOW (ref 4.22–5.81)
RDW: 15.2 % (ref 11.5–15.5)
WBC: 21.4 10*3/uL — ABNORMAL HIGH (ref 4.0–10.5)
nRBC: 0.1 % (ref 0.0–0.2)

## 2023-06-19 LAB — BLOOD CULTURE ID PANEL (REFLEXED) - BCID2

## 2023-06-19 LAB — BASIC METABOLIC PANEL WITH GFR
Anion gap: 13 (ref 5–15)
BUN: 35 mg/dL — ABNORMAL HIGH (ref 8–23)
CO2: 21 mmol/L — ABNORMAL LOW (ref 22–32)
Calcium: 8.4 mg/dL — ABNORMAL LOW (ref 8.9–10.3)
Chloride: 107 mmol/L (ref 98–111)
Creatinine, Ser: 1.95 mg/dL — ABNORMAL HIGH (ref 0.61–1.24)
GFR, Estimated: 34 mL/min — ABNORMAL LOW (ref >60)
Glucose, Bld: 278 mg/dL — ABNORMAL HIGH (ref 70–99)
Potassium: 4.2 mmol/L (ref 3.5–5.1)
Sodium: 141 mmol/L (ref 135–145)

## 2023-06-19 LAB — STREP PNEUMONIAE URINARY ANTIGEN: Strep Pneumo Urinary Antigen: NEGATIVE

## 2023-06-19 LAB — GLUCOSE, CAPILLARY
Glucose-Capillary: 201 mg/dL — ABNORMAL HIGH (ref 70–99)
Glucose-Capillary: 255 mg/dL — ABNORMAL HIGH (ref 70–99)
Glucose-Capillary: 207 mg/dL — ABNORMAL HIGH (ref 70–99)

## 2023-06-19 LAB — ECHOCARDIOGRAM COMPLETE
AV Peak grad: 5.8 mmHg
Ao pk vel: 1.2 m/s
Area-P 1/2: 3.1 cm2
Calc EF: 52.8 %
Height: 75 in
S' Lateral: 3 cm
Single Plane A2C EF: 49.9 %
Single Plane A4C EF: 55.8 %
Weight: 3093.49 [oz_av]

## 2023-06-19 LAB — MRSA NEXT GEN BY PCR, NASAL: MRSA by PCR Next Gen: NOT DETECTED

## 2023-06-19 LAB — HEPARIN LEVEL (UNFRACTIONATED)
Heparin Unfractionated: 1.09 [IU]/mL — ABNORMAL HIGH (ref 0.30–0.70)
Heparin Unfractionated: 0.76 [IU]/mL — ABNORMAL HIGH (ref 0.30–0.70)

## 2023-06-19 LAB — MAGNESIUM: Magnesium: 1.6 mg/dL — ABNORMAL LOW (ref 1.7–2.4)

## 2023-06-19 LAB — LACTIC ACID, PLASMA
Lactic Acid, Venous: 4.2 mmol/L (ref 0.5–1.9)
Lactic Acid, Venous: 5.1 mmol/L (ref 0.5–1.9)
Lactic Acid, Venous: 5.2 mmol/L (ref 0.5–1.9)
Lactic Acid, Venous: 5.6 mmol/L (ref 0.5–1.9)

## 2023-06-19 LAB — PHOSPHORUS: Phosphorus: 3.2 mg/dL (ref 2.5–4.6)

## 2023-06-19 LAB — TROPONIN I (HIGH SENSITIVITY)
Troponin I (High Sensitivity): 5798 ng/L (ref <18)
Troponin I (High Sensitivity): 6129 ng/L (ref <18)
Troponin I (High Sensitivity): 6254 ng/L (ref <18)

## 2023-06-19 LAB — VANCOMYCIN, RANDOM: Vancomycin Rm: 13 ug/mL

## 2023-06-19 MED ORDER — SODIUM CHLORIDE 0.9 % IV SOLN
2.0000 g | Freq: Two times a day (BID) | INTRAVENOUS | Status: DC
  Administered 2023-06-19 – 2023-06-22 (×8): 2 g via INTRAVENOUS
  Filled 2023-06-19 (×9): qty 12.5

## 2023-06-19 MED ORDER — CHLORHEXIDINE GLUCONATE CLOTH 2 % EX PADS: 6.0000 | MEDICATED_PAD | Freq: Every day | CUTANEOUS | Status: DC

## 2023-06-19 MED ORDER — VITAL AF 1.2 CAL PO LIQD
1000.0000 mL | ORAL | Status: DC
  Administered 2023-06-19 – 2023-06-21 (×7): 1000 mL

## 2023-06-19 MED ORDER — MAGNESIUM SULFATE 2 GM/50ML IV SOLN
2.0000 g | Freq: Once | INTRAVENOUS | Status: AC
  Administered 2023-06-19: 2 g via INTRAVENOUS
  Filled 2023-06-19: qty 50

## 2023-06-19 MED ORDER — LACTATED RINGERS IV BOLUS
1000.0000 mL | Freq: Once | INTRAVENOUS | Status: AC
  Administered 2023-06-19: 1000 mL via INTRAVENOUS

## 2023-06-19 MED ORDER — CHLORHEXIDINE GLUCONATE CLOTH 2 % EX PADS
6.0000 | MEDICATED_PAD | Freq: Every day | CUTANEOUS | Status: DC
  Administered 2023-06-19 – 2023-07-07 (×19): 6 via TOPICAL

## 2023-06-19 MED ORDER — PERFLUTREN LIPID MICROSPHERE
1.0000 mL | INTRAVENOUS | Status: AC | PRN
  Administered 2023-06-19: 4 mL via INTRAVENOUS

## 2023-06-19 MED ORDER — HEPARIN (PORCINE) 25000 UT/250ML-% IV SOLN
1300.0000 [IU]/h | INTRAVENOUS | Status: DC
  Administered 2023-06-19: 1300 [IU]/h via INTRAVENOUS
  Administered 2023-06-20 – 2023-06-21 (×2): 900 [IU]/h via INTRAVENOUS
  Administered 2023-06-22: 1100 [IU]/h via INTRAVENOUS
  Filled 2023-06-19 (×4): qty 250

## 2023-06-19 NOTE — Progress Notes (Signed)
 PHARMACY - ANTICOAGULATION CONSULT NOTE  Pharmacy Consult for Heparin  Indication: chest pain/ACS  Allergies  Allergen Reactions   Penicillin G Anaphylaxis    Other reaction(s): Other (See Comments) Couldn't breath among other things Has patient had a PCN reaction causing immediate rash, facial/tongue/throat swelling, SOB or lightheadedness with hypotension: Yes Has patient had a PCN reaction causing severe rash involving mucus membranes or skin necrosis: No Has patient had a PCN reaction that required hospitalization: Yes Has patient had a PCN reaction occurring within the last 10 years: No If all of the above answers are "NO", then may proceed with Ce   Shellfish-Derived Products Anaphylaxis   Statins     Other reaction(s): Other (See Comments) Muscle spasms - can't walk - couldn't turn over in bed   Erythromycin Itching    All mycins   Tamsulosin     Other reaction(s): Dizziness   Tuberculin Ppd     Other reaction(s): Other (See Comments) False test    Iodinated Contrast Media Hives    Patient Measurements: Height: 6\' 3"  (190.5 cm) Weight: 95.3 kg (210 lb) IBW/kg (Calculated) : 84.5 HEPARIN DW (KG): 95.3  Vital Signs: Temp: 99.7 F (37.6 C) (04/12 0015) Temp Source: Axillary (04/11 1720) BP: 116/68 (04/12 0015) Pulse Rate: 80 (04/12 0015)  Labs: Recent Labs    06/18/23 1717 06/18/23 2218 06/18/23 2341  HGB 12.8*  --   --   HCT 40.1  --   --   PLT 426*  --   --   LABPROT  --  15.3*  --   INR  --  1.2  --   CREATININE 1.92*  --   --   TROPONINIHS 96* 4,173* 5,798*    Estimated Creatinine Clearance: 34.8 mL/min (A) (by C-G formula based on SCr of 1.92 mg/dL (H)).   Medical History: Past Medical History:  Diagnosis Date   Cancer (HCC)    COPD (chronic obstructive pulmonary disease) (HCC)    Diabetes mellitus without complication (HCC)    GERD without esophagitis    Hepatitis C virus    history of hep C treated with Harvoni   Hypercholesterolemia     Hypertension    Seasonal allergies     Medications:  Medications Prior to Admission  Medication Sig Dispense Refill Last Dose/Taking   Apple Cider Vinegar 188 MG CAPS Take by mouth.   Unknown   aspirin 81 MG chewable tablet Chew 4 tablets (324 mg total) by mouth daily. (Patient taking differently: Chew 81 mg by mouth daily.)   Unknown   blood glucose meter kit and supplies KIT Dispense based on patient and insurance preference. Use up to four times daily as directed. ICD: E11.9 1 each 0 Unknown   citalopram (CELEXA) 10 MG tablet Take 10 mg by mouth daily.   Unknown   colchicine 0.6 MG tablet Take by mouth.   Unknown   ezetimibe (ZETIA) 10 MG tablet Take 10 mg by mouth daily.   Unknown   Fluticasone-Umeclidin-Vilant (TRELEGY ELLIPTA) 100-62.5-25 MCG/ACT AEPB Inhale 1 puff into the lungs daily. 3 each 3 Unknown   Fluticasone-Umeclidin-Vilant (TRELEGY ELLIPTA) 100-62.5-25 MCG/ACT AEPB Inhale 1 puff into the lungs daily. 14 each 0 Unknown   Garlic 2 MG CAPS Take by mouth.   Unknown   glimepiride (AMARYL) 4 MG tablet Take 1 tablet (4 mg total) by mouth daily with breakfast. (Patient taking differently: Take 2 mg by mouth 2 (two) times daily.) 30 tablet 0 Unknown   insulin glargine (  LANTUS) 100 UNIT/ML injection Inject 10-12 Units into the skin daily.   Unknown   Omega-3 Fatty Acids (OMEGA-3 FISH OIL PO) Take by mouth.   Unknown   pantoprazole (PROTONIX) 40 MG tablet Take 1 tablet (40 mg total) by mouth daily. 30 tablet 0 Unknown   protein supplement shake (PREMIER PROTEIN) LIQD Take 325 mLs (11 oz total) by mouth 4 (four) times daily.  0 Unknown   senna-docusate (SENOKOT-S) 8.6-50 MG tablet Take 1 tablet by mouth at bedtime as needed for mild constipation.   Unknown    Assessment: Pharmacy consulted to dose heparin in this 83 year old male admitted with ACS/NSTEMI.  Pt was on heparin 5000 units SQ Q8H, last dose on 4/11 @ 2214. CrCl = 34.8 ml/min  Goal of Therapy:  Heparin level 0.3-0.7  units/ml Monitor platelets by anticoagulation protocol: Yes   Plan:  No bolus b/c recent SQ heparin.  Start heparin infusion at 1300 units/hr Check anti-Xa level in 8 hours and daily while on heparin Continue to monitor H&H and platelets  Purl Claytor D 06/19/2023,1:13 AM

## 2023-06-19 NOTE — Plan of Care (Signed)
  Problem: Fluid Volume: Goal: Ability to maintain a balanced intake and output will improve Outcome: Progressing   Problem: Metabolic: Goal: Ability to maintain appropriate glucose levels will improve Outcome: Progressing   Problem: Nutritional: Goal: Maintenance of adequate nutrition will improve Outcome: Progressing   Problem: Tissue Perfusion: Goal: Adequacy of tissue perfusion will improve Outcome: Progressing   Problem: Education: Goal: Ability to describe self-care measures that may prevent or decrease complications (Diabetes Survival Skills Education) will improve Outcome: Not Progressing Goal: Individualized Educational Video(s) Outcome: Not Progressing   Problem: Health Behavior/Discharge Planning: Goal: Ability to manage health-related needs will improve Outcome: Not Progressing

## 2023-06-19 NOTE — Progress Notes (Signed)
  Echocardiogram 2D Echocardiogram has been performed. Definity IV ultrasound imaging agent used on this study.  Nichola Barges 06/19/2023, 9:44 AM

## 2023-06-19 NOTE — Consult Note (Signed)
 Edwin Jones is a 83 y.o. male  [id number]  Primary Cardiologist: [facility] cardiology Reason for Consultation: Non-STEMI  HPI: This is a 83 year old white male with a past medical history of COPD small cell lung cancer, hypertension, hyperlipidemia presented to the hospital with respiratory distress.  Patient apparently was having shortness of breath cough and gradually got worse with respiratory distress and in the ER patient was hypoxic with oxygen saturation in the 60s requiring Ambu bag's.  He ended up being intubated and now is unresponsive on sedation.  History is obtained from the ER physicians notes.  Also history obtained from the nurse.  He apparently denied chest pain when he came but had initial troponin of 5798 and then went up to 6254.  He also had lactic acid elevated to 5.6.  He also had white count of 21.4.  He also had BUN of 35 and creatinine 1.95.   Review of Systems: Unable to obtain   Past Medical History:  Diagnosis Date   Cancer (HCC)    COPD (chronic obstructive pulmonary disease) (HCC)    Diabetes mellitus without complication (HCC)    GERD without esophagitis    Hepatitis C virus    history of hep C treated with Harvoni   Hypercholesterolemia    Hypertension    Seasonal allergies     Medications Prior to Admission  Medication Sig Dispense Refill   ascorbic acid (VITAMIN C) 500 MG tablet Take 500 mg by mouth daily.     aspirin 81 MG chewable tablet Chew 4 tablets (324 mg total) by mouth daily. (Patient taking differently: Chew 81 mg by mouth daily.)     cholecalciferol (VITAMIN D3) 25 MCG (1000 UNIT) tablet Take 1,000 Units by mouth daily.     citalopram (CELEXA) 20 MG tablet Take 20 mg by mouth daily.     cyanocobalamin (VITAMIN B12) 500 MCG tablet Take 500 mcg by mouth daily.     ezetimibe (ZETIA) 10 MG tablet Take 10 mg by mouth daily.     Fluticasone-Umeclidin-Vilant (TRELEGY ELLIPTA) 100-62.5-25 MCG/ACT AEPB Inhale 1 puff into the lungs daily. 14 each  0   Garlic 2 MG CAPS Take by mouth.     glimepiride (AMARYL) 4 MG tablet Take 1 tablet (4 mg total) by mouth daily with breakfast. 30 tablet 0   insulin glargine (LANTUS) 100 UNIT/ML injection Inject 10-12 Units into the skin daily.     Omega-3 Fatty Acids (OMEGA-3 FISH OIL PO) Take by mouth.     pantoprazole (PROTONIX) 40 MG tablet Take 1 tablet (40 mg total) by mouth daily. 30 tablet 0   zinc sulfate, 50mg  elemental zinc, 220 (50 Zn) MG capsule Take 220 mg by mouth daily.     Apple Cider Vinegar 188 MG CAPS Take by mouth.     blood glucose meter kit and supplies KIT Dispense based on patient and insurance preference. Use up to four times daily as directed. ICD: E11.9 1 each 0   colchicine 0.6 MG tablet Take by mouth.     Fluticasone-Umeclidin-Vilant (TRELEGY ELLIPTA) 100-62.5-25 MCG/ACT AEPB Inhale 1 puff into the lungs daily. 3 each 3   protein supplement shake (PREMIER PROTEIN) LIQD Take 325 mLs (11 oz total) by mouth 4 (four) times daily.  0      budesonide (PULMICORT) nebulizer solution  0.5 mg Nebulization BID   Chlorhexidine Gluconate Cloth  6 each Topical Daily   insulin aspart  0-15 Units Subcutaneous Q4H   ipratropium-albuterol  3  mL Nebulization Q4H   mouth rinse  15 mL Mouth Rinse Q2H    Infusions:  azithromycin     ceFEPime (MAXIPIME) IV 200 mL/hr at 06/19/23 1200   famotidine (PEPCID) IV Stopped (06/19/23 1045)   feeding supplement (VITAL AF 1.2 CAL) 30 mL/hr at 06/19/23 1200   fentaNYL infusion INTRAVENOUS 30 mcg/hr (06/19/23 1200)   heparin 1,000 Units/hr (06/19/23 1200)   lactated ringers 125 mL/hr at 06/19/23 1200   magnesium sulfate bolus IVPB     norepinephrine (LEVOPHED) Adult infusion 6 mcg/min (06/19/23 1259)   propofol (DIPRIVAN) infusion 10 mcg/kg/min (06/19/23 1200)    Allergies  Allergen Reactions   Penicillin G Anaphylaxis    Other reaction(s): Other (See Comments) Couldn't breath among other things Has patient had a PCN reaction causing immediate  rash, facial/tongue/throat swelling, SOB or lightheadedness with hypotension: Yes Has patient had a PCN reaction causing severe rash involving mucus membranes or skin necrosis: No Has patient had a PCN reaction that required hospitalization: Yes Has patient had a PCN reaction occurring within the last 10 years: No If all of the above answers are "NO", then may proceed with Ce   Shellfish-Derived Products Anaphylaxis   Statins     Other reaction(s): Other (See Comments) Muscle spasms - can't walk - couldn't turn over in bed   Erythromycin Itching    All mycins   Tamsulosin     Other reaction(s): Dizziness   Tuberculin Ppd     Other reaction(s): Other (See Comments) False test    Iodinated Contrast Media Hives    Social History   Socioeconomic History   Marital status: Married    Spouse name: Not on file   Number of children: Not on file   Years of education: Not on file   Highest education level: Not on file  Occupational History   Not on file  Tobacco Use   Smoking status: Former    Current packs/day: 0.00    Average packs/day: 1 pack/day for 50.0 years (50.0 ttl pk-yrs)    Types: Cigarettes    Start date: 04/07/1954    Quit date: 04/07/2004    Years since quitting: 19.2   Smokeless tobacco: Never  Vaping Use   Vaping status: Not on file  Substance and Sexual Activity   Alcohol use: No    Comment: history of alcohol use   Drug use: No   Sexual activity: Yes  Other Topics Concern   Not on file  Social History Narrative   Not on file   Social Drivers of Health   Financial Resource Strain: Not on file  Food Insecurity: Patient Unable To Answer (06/19/2023)   Hunger Vital Sign    Worried About Running Out of Food in the Last Year: Patient unable to answer    Ran Out of Food in the Last Year: Patient unable to answer  Transportation Needs: Patient Unable To Answer (06/19/2023)   PRAPARE - Transportation    Lack of Transportation (Medical): Patient unable to answer     Lack of Transportation (Non-Medical): Patient unable to answer  Physical Activity: Not on file  Stress: Not on file  Social Connections: Patient Unable To Answer (06/19/2023)   Social Connection and Isolation Panel [NHANES]    Frequency of Communication with Friends and Family: Patient unable to answer    Frequency of Social Gatherings with Friends and Family: Patient unable to answer    Attends Religious Services: Patient unable to answer    Active Member  of Clubs or Organizations: Patient unable to answer    Attends Club or Organization Meetings: Patient unable to answer    Marital Status: Patient unable to answer  Intimate Partner Violence: Patient Unable To Answer (06/19/2023)   Humiliation, Afraid, Rape, and Kick questionnaire    Fear of Current or Ex-Partner: Patient unable to answer    Emotionally Abused: Patient unable to answer    Physically Abused: Patient unable to answer    Sexually Abused: Patient unable to answer    Family History  Problem Relation Age of Onset   Skin cancer Mother    Ovarian cancer Sister        sister was diagnosed as a teenager   Throat cancer Brother 13       brother #1   Lung cancer Sister 24   Throat cancer Brother 87       brother #2   Skin cancer Sister     PHYSICAL EXAM: Vitals:   06/19/23 1209 06/19/23 1215  BP: 114/61 (!) 106/57  Pulse: 74 78  Resp: 13 13  Temp:    SpO2: 97%      Intake/Output Summary (Last 24 hours) at 06/19/2023 1300 Last data filed at 06/19/2023 1200 Gross per 24 hour  Intake 6527.01 ml  Output 495 ml  Net 6032.01 ml    General:  Well appearing. No respiratory difficulty HEENT: normal Neck: supple. no JVD. Carotids 2+ bilat; no bruits. No lymphadenopathy or thryomegaly appreciated. Cor: PMI nondisplaced. Regular rate & rhythm. No rubs, gallops or murmurs. Lungs: clear Abdomen: soft, nontender, nondistended. No hepatosplenomegaly. No bruits or masses. Good bowel sounds. Extremities: no cyanosis,  clubbing, rash, edema Neuro: alert & oriented x 3, cranial nerves grossly intact. moves all 4 extremities w/o difficulty. Affect pleasant.  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  Results for orders placed or performed during the hospital encounter of 06/18/23 (from the past 24 hours)  CBG monitoring, ED     Status: Abnormal   Collection Time: 06/18/23  5:13 PM  Result Value Ref Range   Glucose-Capillary 292 (H) 70 - 99 mg/dL  Blood gas, venous     Status: Abnormal   Collection Time: 06/18/23  5:16 PM  Result Value Ref Range   FIO2 1.00 %   Delivery systems BILEVEL POSITIVE AIRWAY PRESSURE    pH, Ven 7.25 7.25 - 7.43   pCO2, Ven 49 44 - 60 mmHg   pO2, Ven 54 (H) 32 - 45 mmHg   Bicarbonate 21.5 20.0 - 28.0 mmol/L   Acid-base deficit 6.0 (H) 0.0 - 2.0 mmol/L   O2 Saturation 82.7 %   Patient temperature 37.0    Collection site VENOUS   Comprehensive metabolic panel     Status: Abnormal   Collection Time: 06/18/23  5:17 PM  Result Value Ref Range   Sodium 142 135 - 145 mmol/L   Potassium 4.1 3.5 - 5.1 mmol/L   Chloride 108 98 - 111 mmol/L   CO2 19 (L) 22 - 32 mmol/L   Glucose, Bld 316 (H) 70 - 99 mg/dL   BUN 35 (H) 8 - 23 mg/dL   Creatinine, Ser 1.61 (H) 0.61 - 1.24 mg/dL   Calcium 8.8 (L) 8.9 - 10.3 mg/dL   Total Protein 7.8 6.5 - 8.1 g/dL   Albumin 3.1 (L) 3.5 - 5.0 g/dL   AST 20 15 -  41 U/L   ALT 10 0 - 44 U/L   Alkaline Phosphatase 62 38 - 126 U/L   Total Bilirubin 0.6 0.0 - 1.2 mg/dL   GFR, Estimated 34 (L) >60 mL/min   Anion gap 15 5 - 15  Lactic acid, plasma     Status: Abnormal   Collection Time: 06/18/23  5:17 PM  Result Value Ref Range   Lactic Acid, Venous 4.7 (HH) 0.5 - 1.9 mmol/L  CBC with Differential     Status: Abnormal   Collection Time: 06/18/23  5:17 PM  Result Value Ref Range   WBC 19.2 (H) 4.0 - 10.5 K/uL   RBC 4.12 (L) 4.22 -  5.81 MIL/uL   Hemoglobin 12.8 (L) 13.0 - 17.0 g/dL   HCT 16.1 09.6 - 04.5 %   MCV 97.3 80.0 - 100.0 fL   MCH 31.1 26.0 - 34.0 pg   MCHC 31.9 30.0 - 36.0 g/dL   RDW 40.9 81.1 - 91.4 %   Platelets 426 (H) 150 - 400 K/uL   nRBC 0.0 0.0 - 0.2 %   Neutrophils Relative % 78 %   Neutro Abs 15.0 (H) 1.7 - 7.7 K/uL   Lymphocytes Relative 14 %   Lymphs Abs 2.7 0.7 - 4.0 K/uL   Monocytes Relative 7 %   Monocytes Absolute 1.3 (H) 0.1 - 1.0 K/uL   Eosinophils Relative 0 %   Eosinophils Absolute 0.0 0.0 - 0.5 K/uL   Basophils Relative 0 %   Basophils Absolute 0.1 0.0 - 0.1 K/uL   Immature Granulocytes 1 %   Abs Immature Granulocytes 0.14 (H) 0.00 - 0.07 K/uL  Brain natriuretic peptide     Status: None   Collection Time: 06/18/23  5:17 PM  Result Value Ref Range   B Natriuretic Peptide 87.6 0.0 - 100.0 pg/mL  Troponin I (High Sensitivity)     Status: Abnormal   Collection Time: 06/18/23  5:17 PM  Result Value Ref Range   Troponin I (High Sensitivity) 96 (H) <18 ng/L  Magnesium     Status: None   Collection Time: 06/18/23  5:17 PM  Result Value Ref Range   Magnesium 2.0 1.7 - 2.4 mg/dL  Phosphorus     Status: Abnormal   Collection Time: 06/18/23  5:17 PM  Result Value Ref Range   Phosphorus 5.7 (H) 2.5 - 4.6 mg/dL  Culture, blood (Routine x 2)     Status: None (Preliminary result)   Collection Time: 06/18/23  5:18 PM   Specimen: BLOOD  Result Value Ref Range   Specimen Description BLOOD BLOOD RIGHT FOREARM    Special Requests      BOTTLES DRAWN AEROBIC AND ANAEROBIC Blood Culture results may not be optimal due to an inadequate volume of blood received in culture bottles   Culture      NO GROWTH < 24 HOURS Performed at [facility], [address] [place], [place] [address]    Report Status PENDING   Culture, blood (Routine x 2)     Status: None (Preliminary result)   Collection Time: 06/18/23  5:18 PM   Specimen: BLOOD  Result Value Ref Range   Specimen Description  BLOOD BLOOD RIGHT ARM    Special Requests      BOTTLES DRAWN AEROBIC AND ANAEROBIC Blood Culture results may not be optimal due to an inadequate volume of blood received in culture bottles   Culture  Setup Time      Organism ID to follow GRAM  POSITIVE COCCI AEROBIC BOTTLE ONLY CRITICAL RESULT CALLED TO, READ BACK BY AND VERIFIED WITH: Harlie Lieu @1102  06/19/23 SKY Performed at [facility], [address] [place], [place] [address]    Culture GRAM POSITIVE COCCI    Report Status PENDING   Blood Culture ID Panel (Reflexed)     Status: Abnormal   Collection Time: 06/18/23  5:18 PM  Result Value Ref Range   Enterococcus faecalis NOT DETECTED NOT DETECTED   Enterococcus Faecium NOT DETECTED NOT DETECTED   Listeria monocytogenes NOT DETECTED NOT DETECTED   Staphylococcus species DETECTED (A) NOT DETECTED   Staphylococcus aureus (BCID) NOT DETECTED NOT DETECTED   Staphylococcus epidermidis NOT DETECTED NOT DETECTED   Staphylococcus lugdunensis NOT DETECTED NOT DETECTED   Streptococcus species NOT DETECTED NOT DETECTED   Streptococcus agalactiae NOT DETECTED NOT DETECTED   Streptococcus pneumoniae NOT DETECTED NOT DETECTED   Streptococcus pyogenes NOT DETECTED NOT DETECTED   A.calcoaceticus-baumannii NOT DETECTED NOT DETECTED   Bacteroides fragilis NOT DETECTED NOT DETECTED   Enterobacterales NOT DETECTED NOT DETECTED   Enterobacter cloacae complex NOT DETECTED NOT DETECTED   Escherichia coli NOT DETECTED NOT DETECTED   Klebsiella aerogenes NOT DETECTED NOT DETECTED   Klebsiella oxytoca NOT DETECTED NOT DETECTED   Klebsiella pneumoniae NOT DETECTED NOT DETECTED   Proteus species NOT DETECTED NOT DETECTED   Salmonella species NOT DETECTED NOT DETECTED   Serratia marcescens NOT DETECTED NOT DETECTED   Haemophilus influenzae NOT DETECTED NOT DETECTED   Neisseria meningitidis NOT DETECTED NOT DETECTED   Pseudomonas aeruginosa NOT DETECTED NOT DETECTED   Stenotrophomonas  maltophilia NOT DETECTED NOT DETECTED   Candida albicans NOT DETECTED NOT DETECTED   Candida auris NOT DETECTED NOT DETECTED   Candida glabrata NOT DETECTED NOT DETECTED   Candida krusei NOT DETECTED NOT DETECTED   Candida parapsilosis NOT DETECTED NOT DETECTED   Candida tropicalis NOT DETECTED NOT DETECTED   Cryptococcus neoformans/gattii NOT DETECTED NOT DETECTED  Resp panel by RT-PCR (RSV, Flu A&B, Covid) Anterior Nasal Swab     Status: None   Collection Time: 06/18/23  5:25 PM   Specimen: Anterior Nasal Swab  Result Value Ref Range   SARS Coronavirus 2 by RT PCR NEGATIVE NEGATIVE   Influenza A by PCR NEGATIVE NEGATIVE   Influenza B by PCR NEGATIVE NEGATIVE   Resp Syncytial Virus by PCR NEGATIVE NEGATIVE  Urinalysis, w/ Reflex to Culture (Infection Suspected) -Urine, Random     Status: Abnormal   Collection Time: 06/18/23  7:37 PM  Result Value Ref Range   Specimen Source URINE, CATHETERIZED    Color, Urine YELLOW (A) YELLOW   APPearance CLEAR (A) CLEAR   Specific Gravity, Urine 1.023 1.005 - 1.030   pH 5.0 5.0 - 8.0   Glucose, UA 50 (A) NEGATIVE mg/dL   Hgb urine dipstick NEGATIVE NEGATIVE   Bilirubin Urine NEGATIVE NEGATIVE   Ketones, ur NEGATIVE NEGATIVE mg/dL   Protein, ur 604 (A) NEGATIVE mg/dL   Nitrite NEGATIVE NEGATIVE   Leukocytes,Ua NEGATIVE NEGATIVE   RBC / HPF 0-5 0 - 5 RBC/hpf   WBC, UA 0-5 0 - 5 WBC/hpf   Bacteria, UA NONE SEEN NONE SEEN   Squamous Epithelial / HPF 0 0 - 5 /HPF   Mucus PRESENT    Hyaline Casts, UA PRESENT   CBG monitoring, ED     Status: Abnormal   Collection Time: 06/18/23  8:45 PM  Result Value Ref Range   Glucose-Capillary 311 (H) 70 - 99  mg/dL   Comment 1 Notify RN    Comment 2 Document in Chart   Blood gas, arterial     Status: Abnormal   Collection Time: 06/18/23  9:08 PM  Result Value Ref Range   FIO2 100 %   Delivery systems VENTILATOR    Mode PRESSURE REGULATED VOLUME CONTROL    MECHVT 550 mL   RATE 18 resp/min   PEEP 8 cm  H20   pH, Arterial 7.26 (L) 7.35 - 7.45   pCO2 arterial 47 32 - 48 mmHg   pO2, Arterial 125 (H) 83 - 108 mmHg   Bicarbonate 21.1 20.0 - 28.0 mmol/L   Acid-base deficit 6.1 (H) 0.0 - 2.0 mmol/L   O2 Saturation 98.8 %   Patient temperature 37.0    Collection site LEFT RADIAL    Allens test (pass/fail) PASS PASS  Glucose, capillary     Status: Abnormal   Collection Time: 06/18/23  9:38 PM  Result Value Ref Range   Glucose-Capillary 289 (H) 70 - 99 mg/dL  MRSA Next Gen by PCR, Nasal     Status: None   Collection Time: 06/18/23 10:06 PM   Specimen: Nasal Mucosa; Nasal Swab  Result Value Ref Range   MRSA by PCR Next Gen NOT DETECTED NOT DETECTED  Lactic acid, plasma     Status: Abnormal   Collection Time: 06/18/23 10:18 PM  Result Value Ref Range   Lactic Acid, Venous 4.6 (HH) 0.5 - 1.9 mmol/L  Protime-INR     Status: Abnormal   Collection Time: 06/18/23 10:18 PM  Result Value Ref Range   Prothrombin Time 15.3 (H) 11.4 - 15.2 seconds   INR 1.2 0.8 - 1.2  Troponin I (High Sensitivity)     Status: Abnormal   Collection Time: 06/18/23 10:18 PM  Result Value Ref Range   Troponin I (High Sensitivity) 4,173 (HH) <18 ng/L  Procalcitonin     Status: None   Collection Time: 06/18/23 10:18 PM  Result Value Ref Range   Procalcitonin 1.60 ng/mL  Glucose, capillary     Status: Abnormal   Collection Time: 06/18/23 11:38 PM  Result Value Ref Range   Glucose-Capillary 229 (H) 70 - 99 mg/dL  Troponin I (High Sensitivity)     Status: Abnormal   Collection Time: 06/18/23 11:41 PM  Result Value Ref Range   Troponin I (High Sensitivity) 5,798 (HH) <18 ng/L  Lactic acid, plasma     Status: Abnormal   Collection Time: 06/18/23 11:42 PM  Result Value Ref Range   Lactic Acid, Venous 5.1 (HH) 0.5 - 1.9 mmol/L  Strep pneumoniae urinary antigen (not at [facility])     Status: None   Collection Time: 06/18/23 11:47 PM  Result Value Ref Range   Strep Pneumo Urinary Antigen NEGATIVE NEGATIVE  Blood gas,  arterial     Status: Abnormal   Collection Time: 06/19/23  2:58 AM  Result Value Ref Range   FIO2 50 %   Delivery systems VENTILATOR    Mode PRESSURE REGULATED VOLUME CONTROL    MECHVT 550 mL   RATE 18 resp/min   PEEP 8 cm H20   pH, Arterial 7.31 (L) 7.35 - 7.45   pCO2 arterial 41 32 - 48 mmHg   pO2, Arterial 83 83 - 108 mmHg   Bicarbonate 20.7 20.0 - 28.0 mmol/L   Acid-base deficit 5.5 (H) 0.0 - 2.0 mmol/L   O2 Saturation 97.1 %   Patient temperature 36.3    Collection site RIGHT  RADIAL    Allens test (pass/fail) PASS PASS  Glucose, capillary     Status: Abnormal   Collection Time: 06/19/23  3:43 AM  Result Value Ref Range   Glucose-Capillary 201 (H) 70 - 99 mg/dL  CBC     Status: Abnormal   Collection Time: 06/19/23  4:52 AM  Result Value Ref Range   WBC 21.4 (H) 4.0 - 10.5 K/uL   RBC 3.77 (L) 4.22 - 5.81 MIL/uL   Hemoglobin 11.9 (L) 13.0 - 17.0 g/dL   HCT 09.8 (L) 11.9 - 14.7 %   MCV 96.3 80.0 - 100.0 fL   MCH 31.6 26.0 - 34.0 pg   MCHC 32.8 30.0 - 36.0 g/dL   RDW 82.9 56.2 - 13.0 %   Platelets 398 150 - 400 K/uL   nRBC 0.1 0.0 - 0.2 %  Basic metabolic panel     Status: Abnormal   Collection Time: 06/19/23  4:52 AM  Result Value Ref Range   Sodium 141 135 - 145 mmol/L   Potassium 4.2 3.5 - 5.1 mmol/L   Chloride 107 98 - 111 mmol/L   CO2 21 (L) 22 - 32 mmol/L   Glucose, Bld 278 (H) 70 - 99 mg/dL   BUN 35 (H) 8 - 23 mg/dL   Creatinine, Ser 8.65 (H) 0.61 - 1.24 mg/dL   Calcium 8.4 (L) 8.9 - 10.3 mg/dL   GFR, Estimated 34 (L) >60 mL/min   Anion gap 13 5 - 15  Phosphorus     Status: None   Collection Time: 06/19/23  4:52 AM  Result Value Ref Range   Phosphorus 3.2 2.5 - 4.6 mg/dL  Magnesium     Status: Abnormal   Collection Time: 06/19/23  4:52 AM  Result Value Ref Range   Magnesium 1.6 (L) 1.7 - 2.4 mg/dL  Troponin I (High Sensitivity)     Status: Abnormal   Collection Time: 06/19/23  4:52 AM  Result Value Ref Range   Troponin I (High Sensitivity) 6,129 (HH)  <18 ng/L  Troponin I (High Sensitivity)     Status: Abnormal   Collection Time: 06/19/23  6:39 AM  Result Value Ref Range   Troponin I (High Sensitivity) 6,254 (HH) <18 ng/L  Heparin level (unfractionated)     Status: Abnormal   Collection Time: 06/19/23  8:37 AM  Result Value Ref Range   Heparin Unfractionated 1.09 (H) 0.30 - 0.70 IU/mL  Lactic acid, plasma     Status: Abnormal   Collection Time: 06/19/23  8:37 AM  Result Value Ref Range   Lactic Acid, Venous 5.6 (HH) 0.5 - 1.9 mmol/L  Vancomycin, random     Status: None   Collection Time: 06/19/23  8:37 AM  Result Value Ref Range   Vancomycin Rm 13 ug/mL  Glucose, capillary     Status: Abnormal   Collection Time: 06/19/23 11:31 AM  Result Value Ref Range   Glucose-Capillary 255 (H) 70 - 99 mg/dL   DG Chest Port 1 View Result Date: 06/19/2023 CLINICAL DATA:  Respiratory failure EXAM: PORTABLE CHEST 1 VIEW COMPARISON:  Yesterday FINDINGS: Endotracheal tube with tip between the clavicular heads and carina. The enteric tube at least reaches the stomach. Right port with tip at the SVC. Artifact from EKG leads. Hazy appearance at the lung bases likely reflecting atelectasis. Small pleural effusions. No pneumothorax. IMPRESSION: Stable hardware positioning and lower chest infiltrates. Electronically Signed   By: Ronnette Coke M.D.   On: 06/19/2023 05:13   DG  Chest Portable 1 View Result Date: 06/18/2023 CLINICAL DATA:  ET tube placement EXAM: PORTABLE CHEST 1 VIEW COMPARISON:  06/18/2023 FINDINGS: Endotracheal tube tip appears to be in the midtrachea. The carina is difficult to visualize. Right Port-A-Cath remains in place, unchanged. Heart mediastinal contours are within normal limits. Patchy opacities in the left mid lung and both lower lobes, increasing in the right lower lobe since prior study. IMPRESSION: Endotracheal tube tip appears to be in the midtrachea. Carina difficult to visualize. Patchy bilateral airspace opacities, increasing  in the right lung base since prior study. Electronically Signed   By: Janeece Mechanic M.D.   On: 06/18/2023 20:31   DG Abdomen 1 View Result Date: 06/18/2023 CLINICAL DATA:  OG tube placement EXAM: ABDOMEN - 1 VIEW COMPARISON:  None Available. FINDINGS: OG tube tip in the proximal stomach with the side port near the GE junction. Nonobstructive bowel gas pattern. IMPRESSION: OG tube tip in the proximal stomach with the side port at the GE junction. Electronically Signed   By: Janeece Mechanic M.D.   On: 06/18/2023 20:05   DG Chest Port 1 View Result Date: 06/18/2023 CLINICAL DATA:  Sepsis EXAM: PORTABLE CHEST 1 VIEW COMPARISON:  Chest x-ray 05/20/2017 FINDINGS: Right chest port catheter tip ends in the distal SVC. The cardiomediastinal silhouette is within normal limits. There is focal airspace disease in the left mid lung and left lower lung. This is new from prior. Costophrenic angles are clear. No pneumothorax or acute fracture. IMPRESSION: Focal airspace disease in the left mid lung and left lower lung concerning for multifocal pneumonia. Recommend follow-up chest x-ray in 4-6 weeks to confirm resolution. Electronically Signed   By: Tyron Gallon M.D.   On: 06/18/2023 18:40     ASSESSMENT AND PLAN:  #1 Non-STEMI with peak troponin 6254 with nonspecific ST-T changes on EKG and respiratory failure.  Agree with continuing aspirin and IV heparin and will not be able to do any invasive procedure until patient is extubated. #2 respiratory failure sepsis/pneumonia, patient has respiratory failure requiring intubation with chest x-ray revealing left midlung and left lower lung multifocal pneumonia.  Patient also has elevated lactic acid and may be septic.  Patient currently on antibiotics and pressors.  However blood pressure is normal at this time. Edwin Jones Meredeth Stallion

## 2023-06-19 NOTE — IPAL (Signed)
  Interdisciplinary Goals of Care Family Meeting   Date carried out: 06/19/2023  Location of the meeting: Bedside  Member's involved: Physician, Bedside Registered Nurse, and Family Member or next of kin   GOALS OF CARE DISCUSSION  The Clinical status was relayed to family in detail- Wife at bedside  Updated and notified of patients medical condition- Patient remains unresponsive and will not open eyes to command.   Patient with increased WOB and using accessory muscles to breathe Explained to family course of therapy and the modalities  Patient with Progressive multiorgan failure with a very high probablity of a very minimal chance of meaningful recovery despite all aggressive and optimal medical therapy.   PATIENT REMAINS FULL CODE Patient on vent support On vasopressors +heart attack NSTEMI  Wife does NOT understand the situation. I have explained in detail about patients critical condition He is on  life support to save his life, we are giving him medicines that is in his best interest and we DO NOT provide herbal supplements. She states she gets her medical opinions from TV shows  I have also explained that this is NOT a TV show and that we will give him what he needs Wife has a very aggressive tone and does not trust anyone.  I have re-assured her that we have everything he needs to get better and its up to the patient to respond to medications. Wife asked me to predict the future and how long patient will stay here and I replied that patient is very sick and don't know how long he will stay in the hospital.   With advanced Age and NSTEMI and COPD with CKD, I anticipate prolonged ICU LOS and hospital stay.   Additional CC time 45 mins   Ugochukwu Chichester Nestora Baptise, M.D.  [facility]  Medical Director [facility] Medical Director [facility] Cardio-Pulmonary Department

## 2023-06-19 NOTE — Progress Notes (Addendum)
 Initial Nutrition Assessment  DOCUMENTATION CODES:   Not applicable  INTERVENTION:   Initiate tube feeding via OG tube: Vital AF 1.2 at 30 ml/h, increase by 10 ml every 4 hours to goal rate of 70 ml/h (1680 ml per day). Provides 2016 kcal (2394 kcal total with current propofol rate), 126 gm protein, 1284 ml free water daily.  NUTRITION DIAGNOSIS:   Inadequate oral intake related to inability to eat as evidenced by NPO status.  GOAL:   Patient will meet greater than or equal to 90% of their needs  MONITOR:   TF tolerance, Vent status  REASON FOR ASSESSMENT:   Consult Enteral/tube feeding initiation and management  ASSESSMENT:   83 yo male admitted with CAP, NSTEMI, septic shock. Required emergent intubation. PMH includes COPD, HLD, HTN, GERD, DM-2, lung cancer (2018), shellfish allergy.  Received MD Consult for TF initiation and management. OG tube in place.  Patient is currently intubated on ventilator support MV: 10.7 L/min Temp (24hrs), Avg:98.3 F (36.8 C), Min:96.6 F (35.9 C), Max:100.6 F (38.1 C) MAP (cuff): 90 Propofol: 14.3 ml/hr providing 378 kcal daily.  Labs reviewed. Mag 1.6 CBG: 229-201  Medications reviewed and include novolog, pepcid, IV antibiotics, fentanyl, heparin, levophed, propofol. IVF: LR at 125 ml/h  No recent weight hx available for review. Patient weighed 95.3 kg approximately 2.5 years ago. Currently 87.7 kg. 8% weight loss over an unknown time period.   NUTRITION - FOCUSED PHYSICAL EXAM:  Unable to complete  Diet Order:   Diet Order             Diet NPO time specified  Diet effective now                   EDUCATION NEEDS:   No education needs have been identified at this time  Skin:  Skin Assessment: Reviewed RN Assessment  Last BM:  4/11  Height:   Ht Readings from Last 1 Encounters:  06/18/23 6\' 3"  (1.905 m)    Weight:   Wt Readings from Last 1 Encounters:  06/19/23 87.7 kg    Ideal Body Weight:   89.1 kg  BMI:  Body mass index is 24.17 kg/m.  Estimated Nutritional Needs:   Kcal:  2000-2200  Protein:  115-130 gm  Fluid:  2-2.2 L   Barnet Boots RD, LDN, CNSC Contact via secure chat. If unavailable, use group chat "RD Inpatient."

## 2023-06-19 NOTE — Progress Notes (Signed)
 NAME:  Edwin Jones, MRN:  [id number], DOB:  1940/11/11, LOS: 84 ADMISSION DATE:  06/18/2023 CONSULTATION DATE: 06/18/2023 REFERRING MD: Dr. Derrill Kay CHIEF COMPLAINT: Pneumonia, sepsis, septic shock and respiratory failure  BRIEF SYNOPSIS: 83 year old male presenting with shortness of breath, productive cough, found to have community-acquired pneumonia, sepsis and septic shock, failed BiPAP and was emergently intubated.  History of Present Illness:  Edwin Jones is an 83 year old African-American male with a history of type 2 diabetes, hypertension, small cell lung cancer, and COPD who presented to the ED via EMS with complaints of respiratory distress.  History is obtained from EMS records as well as ED records as patient is currently intubated and sedated and there is no family at the bedside.  Per ED records, EMS was called for respiratory distress.  Upon EMS arrival, patient's oxygen saturation was 60%.  Bag mask ventilation was started and patient was transported to the ED after an unsuccessful intubation attempt in the field.  Upon ED arrival, patient had copious amounts of secretions in his oral cavity.  Initial ED workup revealed multifocal pneumonia with sepsis.  Patient was placed on BiPAP however he became combative and started coughing profusely and also had large amounts of secretions that filled up the BiPAP mask.  His oxygen saturation dropped and he was emergently intubated.  PCCM was consulted for further management. ED course: His initial venous gas in the ED showed a pH of 7.25, PCO2 of 49, and PaO2 of 54.  Chemistry showed CO2 level of 19, glucose level of 360 mg/dL, BUN of 35, and a creatinine of 1.90 which is close to his baseline.  CBC showed a WBC of 19.2 platelets of 426, neutrophils of 15.0.  His respiratory panel was negative for influenza and COVID as well as a RSV.  His chest x-ray showed multifocal pneumonia.  He was started on broad-spectrum antibiotics and fluid resuscitation  was also initiated.  Postintubation, patient became hypotensive requiring pressors.  He was started on peripheral norepinephrine. Upon arrival in the ICU, patient was intubated and sedated.  His initial troponin was 96 however the second troponin peaked to 4173.  Lactic acid was 4.7 and peaked at 5.1.  He was started on a heparin infusion and cardiology was consulted.  A 2D echo was ordered.  He was also given multiple fluid boluses.  Pertinent  Medical History   Past Medical History:  Diagnosis Date   Cancer (HCC)    COPD (chronic obstructive pulmonary disease) (HCC)    Diabetes mellitus without complication (HCC)    GERD without esophagitis    Hepatitis C virus    history of hep C treated with Harvoni   Hypercholesterolemia    Hypertension    Seasonal allergies     Past Surgical History:  Procedure Laterality Date   COLONOSCOPY     approximately 11 years ago   FLEXIBLE BRONCHOSCOPY N/A 11/05/2016   Procedure: FLEXIBLE BRONCHOSCOPY;  Surgeon: Merwyn Katos, MD;  Location: [facility] ORS;  Service: Pulmonary;  Laterality: N/A;   IR FLUORO GUIDE CV LINE RIGHT  05/06/2017   IR INTRAVSC STENT CERV CAROTID W/O EMB-PROT MOD SED INC ANGIO  05/06/2017   IR PERCUTANEOUS ART THROMBECTOMY/INFUSION INTRACRANIAL INC DIAG ANGIO  05/06/2017   IR US GUIDE VASC ACCESS LEFT  05/06/2017   IR US GUIDE VASC ACCESS RIGHT  05/06/2017   IR US GUIDE VASC ACCESS RIGHT  05/06/2017   PORTA CATH INSERTION N/A 11/23/2016   Procedure: Shelda Pal Cath Insertion;  Surgeon:  Celso College, MD;  Location: [facility] INVASIVE CV LAB;  Service: Cardiovascular;  Laterality: N/A;   RADIOLOGY WITH ANESTHESIA N/A 05/06/2017   Procedure: IR WITH ANESTHESIA;  Surgeon: Radiologist, Medication, MD;  Location: [facility] OR;  Service: Radiology;  Laterality: N/A;   TEE WITHOUT CARDIOVERSION N/A 05/10/2017   Procedure: TRANSESOPHAGEAL ECHOCARDIOGRAM (TEE);  Surgeon: Luana Rumple, MD;  Location: [facility] ENDOSCOPY;  Service: Cardiovascular;  Laterality: N/A;    TONSILLECTOMY      Social History   Socioeconomic History   Marital status: Married    Spouse name: Not on file   Number of children: Not on file   Years of education: Not on file   Highest education level: Not on file  Occupational History   Not on file  Tobacco Use   Smoking status: Former    Current packs/day: 0.00    Average packs/day: 1 pack/day for 50.0 years (50.0 ttl pk-yrs)    Types: Cigarettes    Start date: 04/07/1954    Quit date: 04/07/2004    Years since quitting: 19.2   Smokeless tobacco: Never  Vaping Use   Vaping status: Not on file  Substance and Sexual Activity   Alcohol use: No    Comment: history of alcohol use   Drug use: No   Sexual activity: Yes  Other Topics Concern   Not on file  Social History Narrative   Not on file   Social Drivers of Health   Financial Resource Strain: Not on file  Food Insecurity: Patient Unable To Answer (06/19/2023)   Hunger Vital Sign    Worried About Running Out of Food in the Last Year: Patient unable to answer    Ran Out of Food in the Last Year: Patient unable to answer  Transportation Needs: Patient Unable To Answer (06/19/2023)   PRAPARE - Transportation    Lack of Transportation (Medical): Patient unable to answer    Lack of Transportation (Non-Medical): Patient unable to answer  Physical Activity: Not on file  Stress: Not on file  Social Connections: Patient Unable To Answer (06/19/2023)   Social Connection and Isolation Panel [NHANES]    Frequency of Communication with Friends and Family: Patient unable to answer    Frequency of Social Gatherings with Friends and Family: Patient unable to answer    Attends Religious Services: Patient unable to answer    Active Member of Clubs or Organizations: Patient unable to answer    Attends Banker Meetings: Patient unable to answer    Marital Status: Patient unable to answer  Intimate Partner Violence: Patient Unable To Answer (06/19/2023)   Humiliation,  Afraid, Rape, and Kick questionnaire    Fear of Current or Ex-Partner: Patient unable to answer    Emotionally Abused: Patient unable to answer    Physically Abused: Patient unable to answer    Sexually Abused: Patient unable to answer     Significant Hospital Events: Including procedures, antibiotic start and stop dates in addition to other pertinent events   06/18/23: Admitted with multifocal pneumonia, sepsis and septic shock requiring intubations 4/12 remains intubated, septic shock NSTEMI  Micro Data:   Results for orders placed or performed during the hospital encounter of 06/18/23  Culture, blood (Routine x 2)     Status: None (Preliminary result)   Collection Time: 06/18/23  5:18 PM   Specimen: BLOOD  Result Value Ref Range Status   Specimen Description BLOOD BLOOD RIGHT FOREARM  Final   Special Requests   Final  BOTTLES DRAWN AEROBIC AND ANAEROBIC Blood Culture results may not be optimal due to an inadequate volume of blood received in culture bottles   Culture   Final    NO GROWTH < 24 HOURS Performed at [facility], [address] [place], [place] [address]    Report Status PENDING  Incomplete  Culture, blood (Routine x 2)     Status: None (Preliminary result)   Collection Time: 06/18/23  5:18 PM   Specimen: BLOOD  Result Value Ref Range Status   Specimen Description BLOOD BLOOD RIGHT ARM  Final   Special Requests   Final    BOTTLES DRAWN AEROBIC AND ANAEROBIC Blood Culture results may not be optimal due to an inadequate volume of blood received in culture bottles   Culture  Setup Time   Final    Organism ID to follow GRAM POSITIVE COCCI AEROBIC BOTTLE ONLY CRITICAL RESULT CALLED TO, READ BACK BY AND VERIFIED WITH: Theora Flair PATEL @1102  06/19/23 SKY Performed at [facility], [address] [place], [place] [address]    Culture GRAM POSITIVE COCCI  Final   Report Status PENDING  Incomplete  Blood Culture ID Panel (Reflexed)     Status:  Abnormal   Collection Time: 06/18/23  5:18 PM  Result Value Ref Range Status   Enterococcus faecalis NOT DETECTED NOT DETECTED Final   Enterococcus Faecium NOT DETECTED NOT DETECTED Final   Listeria monocytogenes NOT DETECTED NOT DETECTED Final   Staphylococcus species DETECTED (A) NOT DETECTED Final    Comment: CRITICAL RESULT CALLED TO, READ BACK BY AND VERIFIED WITH: Theora Flair PATEL @ 1102 06/19/23 SKY    Staphylococcus aureus (BCID) NOT DETECTED NOT DETECTED Final   Staphylococcus epidermidis NOT DETECTED NOT DETECTED Final   Staphylococcus lugdunensis NOT DETECTED NOT DETECTED Final   Streptococcus species NOT DETECTED NOT DETECTED Final   Streptococcus agalactiae NOT DETECTED NOT DETECTED Final   Streptococcus pneumoniae NOT DETECTED NOT DETECTED Final   Streptococcus pyogenes NOT DETECTED NOT DETECTED Final   A.calcoaceticus-baumannii NOT DETECTED NOT DETECTED Final   Bacteroides fragilis NOT DETECTED NOT DETECTED Final   Enterobacterales NOT DETECTED NOT DETECTED Final   Enterobacter cloacae complex NOT DETECTED NOT DETECTED Final   Escherichia coli NOT DETECTED NOT DETECTED Final   Klebsiella aerogenes NOT DETECTED NOT DETECTED Final   Klebsiella oxytoca NOT DETECTED NOT DETECTED Final   Klebsiella pneumoniae NOT DETECTED NOT DETECTED Final   Proteus species NOT DETECTED NOT DETECTED Final   Salmonella species NOT DETECTED NOT DETECTED Final   Serratia marcescens NOT DETECTED NOT DETECTED Final   Haemophilus influenzae NOT DETECTED NOT DETECTED Final   Neisseria meningitidis NOT DETECTED NOT DETECTED Final   Pseudomonas aeruginosa NOT DETECTED NOT DETECTED Final   Stenotrophomonas maltophilia NOT DETECTED NOT DETECTED Final   Candida albicans NOT DETECTED NOT DETECTED Final   Candida auris NOT DETECTED NOT DETECTED Final   Candida glabrata NOT DETECTED NOT DETECTED Final   Candida krusei NOT DETECTED NOT DETECTED Final   Candida parapsilosis NOT DETECTED NOT DETECTED Final    Candida tropicalis NOT DETECTED NOT DETECTED Final   Cryptococcus neoformans/gattii NOT DETECTED NOT DETECTED Final    Comment: Performed at [facility], [address] [place], [place] [address]  Resp panel by RT-PCR (RSV, Flu A&B, Covid) Anterior Nasal Swab     Status: None   Collection Time: 06/18/23  5:25 PM   Specimen: Anterior Nasal Swab  Result Value Ref Range Status   SARS  Coronavirus 2 by RT PCR NEGATIVE NEGATIVE Final    Comment: (NOTE) SARS-CoV-2 target nucleic acids are NOT DETECTED.  The SARS-CoV-2 RNA is generally detectable in upper respiratory specimens during the acute phase of infection. The lowest concentration of SARS-CoV-2 viral copies this assay can detect is 138 copies/mL. A negative result does not preclude SARS-Cov-2 infection and should not be used as the sole basis for treatment or other patient management decisions. A negative result may occur with  improper specimen collection/handling, submission of specimen other than nasopharyngeal swab, presence of viral mutation(s) within the areas targeted by this assay, and inadequate number of viral copies(<138 copies/mL). A negative result must be combined with clinical observations, patient history, and epidemiological information. The expected result is Negative.  Fact Sheet for Patients:  BloggerCourse.com  Fact Sheet for Healthcare Providers:  SeriousBroker.it  This test is no t yet approved or cleared by the [place]  FDA and  has been authorized for detection and/or diagnosis of SARS-CoV-2 by FDA under an Emergency Use Authorization (EUA). This EUA will remain  in effect (meaning this test can be used) for the duration of the COVID-19 declaration under Section 564(b)(1) of the Act, 21 U.S.C.section 360bbb-3(b)(1), unless the authorization is terminated  or revoked sooner.       Influenza A by PCR NEGATIVE NEGATIVE Final   Influenza B  by PCR NEGATIVE NEGATIVE Final    Comment: (NOTE) The Xpert Xpress SARS-CoV-2/FLU/RSV plus assay is intended as an aid in the diagnosis of influenza from Nasopharyngeal swab specimens and should not be used as a sole basis for treatment. Nasal washings and aspirates are unacceptable for Xpert Xpress SARS-CoV-2/FLU/RSV testing.  Fact Sheet for Patients: BloggerCourse.com  Fact Sheet for Healthcare Providers: SeriousBroker.it  This test is not yet approved or cleared by the [place]  FDA and has been authorized for detection and/or diagnosis of SARS-CoV-2 by FDA under an Emergency Use Authorization (EUA). This EUA will remain in effect (meaning this test can be used) for the duration of the COVID-19 declaration under Section 564(b)(1) of the Act, 21 U.S.C. section 360bbb-3(b)(1), unless the authorization is terminated or revoked.     Resp Syncytial Virus by PCR NEGATIVE NEGATIVE Final    Comment: (NOTE) Fact Sheet for Patients: BloggerCourse.com  Fact Sheet for Healthcare Providers: SeriousBroker.it  This test is not yet approved or cleared by the [place]  FDA and has been authorized for detection and/or diagnosis of SARS-CoV-2 by FDA under an Emergency Use Authorization (EUA). This EUA will remain in effect (meaning this test can be used) for the duration of the COVID-19 declaration under Section 564(b)(1) of the Act, 21 U.S.C. section 360bbb-3(b)(1), unless the authorization is terminated or revoked.  Performed at [facility], [address] [place], [place] [address]   MRSA Next Gen by PCR, Nasal     Status: None   Collection Time: 06/18/23 10:06 PM   Specimen: Nasal Mucosa; Nasal Swab  Result Value Ref Range Status   MRSA by PCR Next Gen NOT DETECTED NOT DETECTED Final    Comment: (NOTE) The GeneXpert MRSA Assay (FDA approved for NASAL specimens  only), is one component of a comprehensive MRSA colonization surveillance program. It is not intended to diagnose MRSA infection nor to guide or monitor treatment for MRSA infections. Test performance is not FDA approved in patients less than 71 years old. Performed at [facility], [address], [place], [place] [address]     Antimicrobials:  Vacomycin, ceftriaxone and  azithromycin   Interim History / Subjective:  As above  Objective   Blood pressure 106/66, pulse 75, temperature 98.2 F (36.8 C), resp. rate 14, height 6\' 3"  (1.905 m), weight 87.7 kg, SpO2 97%.    Vent Mode: PRVC FiO2 (%):  [40 %-100 %] 40 % Set Rate:  [18 bmp] 18 bmp Vt Set:  [550 mL] 550 mL PEEP:  [8 cmH20] 8 cmH20 Plateau Pressure:  [24 cmH20] 24 cmH20   Intake/Output Summary (Last 24 hours) at 06/19/2023 1352 Last data filed at 06/19/2023 1300 Gross per 24 hour  Intake 6805.25 ml  Output 495 ml  Net 6310.25 ml   Filed Weights   06/18/23 1710 06/19/23 0515  Weight: 95.3 kg 87.7 kg    REVIEW OF SYSTEMS  PATIENT IS UNABLE TO PROVIDE COMPLETE REVIEW OF SYSTEMS DUE TO SEVERE CRITICAL ILLNESS   PHYSICAL EXAMINATION:  GENERAL:critically ill appearing, +resp distress EYES: Pupils equal, round, reactive to light.  No scleral icterus.  MOUTH: Moist mucosal membrane. INTUBATED NECK: Supple.  PULMONARY: Lungs clear to auscultation, +rhonchi, +wheezing CARDIOVASCULAR: S1 and S2.  Regular rate and rhythm GASTROINTESTINAL: Soft, nontender, -distended. Positive bowel sounds.  MUSCULOSKELETAL: No swelling, clubbing, or edema.  NEUROLOGIC: obtunded,sedated SKIN:normal, warm to touch, Capillary refill delayed  Pulses present bilaterally    Labs/imaging that I havepersonally reviewed  (right click and "Reselect all SmartList Selections" daily)       ASSESSMENT AND PLAN  83 yo male admitted to ICU Acute hypoxic respiratory failure with Community acquired pneumonia/ severe COPD exacerbation  complicated by NSTEMI leading to metabolic encephalopathy with underlying CKD  Severe ACUTE Hypoxic and Hypercapnic Respiratory Failure -continue Mechanical Ventilator support -Wean Fio2 and PEEP as tolerated -VAP/VENT bundle implementation - Wean PEEP & FiO2 as tolerated, maintain SpO2 > 88% - Head of bed elevated 30 degrees, VAP protocol in place - Plateau pressures less than 30 cm H20  - Intermittent chest x-ray & ABG PRN - Ensure adequate pulmonary hygiene  Unable to wean from vent  Vent Mode: PRVC FiO2 (%):  [40 %-100 %] 40 % Set Rate:  [18 bmp] 18 bmp Vt Set:  [550 mL] 550 mL PEEP:  [8 cmH20] 8 cmH20 Plateau Pressure:  [24 cmH20] 24 cmH20    SEVERE COPD EXACERBATION -continue IV steroids as prescribed  CARDIAC FAILURE DUE TO NSTEMI NSTEMI-Troponin trending up from 96 to 4173 Continue Heparin infusion   SEPTIC shock SOURCE-Septic shock due to pneumonia -use vasopressors to keep MAP>65 as needed -follow ABG and LA as needed   ACUTE KIDNEY INJURY/Renal Failure -continue Foley Catheter-assess need -Avoid nephrotoxic agents -Follow urine output, BMP -Ensure adequate renal perfusion, optimize oxygenation -Renal dose medications   Intake/Output Summary (Last 24 hours) at 06/19/2023 1357 Last data filed at 06/19/2023 1300 Gross per 24 hour  Intake 6805.25 ml  Output 495 ml  Net 6310.25 ml     NEUROLOGY ACUTE METABOLIC ENCEPHALOPATHY -need for sedation -Goal RASS -2 to -3   ENDO - ICU hypoglycemic\Hyperglycemia protocol -check FSBS per protocol   GI GI PROPHYLAXIS as indicated NUTRITIONAL STATUS DIET-->TF's as tolerated Constipation protocol as indicated   ELECTROLYTES -follow labs as needed -replace as needed -pharmacy consultation and following  RESTRICTIVE TRANSFUSION PROTOCOL TRANSFUSION  IF HGB<7  or ACTIVE BLEEDING OR DX of ACUTE CORONARY SYNDROMES     Best practice (right click and "Reselect all SmartList Selections" daily)  Diet:  NPO Pain/Anxiety/Delirium protocol (if indicated): Yes (RASS goal -1,-2) VAP protocol (if indicated): Yes DVT prophylaxis:  systemic heparin GI prophylaxis: H2B Glucose control:  SSI Yes Central venous access:  Yes, and it is still needed Arterial line:  N/A Foley:  Yes, and it is still needed Mobility:  bed rest  Code Status:  FULL Disposition:ICU  Labs   CBC: Recent Labs  Lab 06/18/23 1717 06/19/23 0452  WBC 19.2* 21.4*  NEUTROABS 15.0*  --   HGB 12.8* 11.9*  HCT 40.1 36.3*  MCV 97.3 96.3  PLT 426* 398    Basic Metabolic Panel: Recent Labs  Lab 06/18/23 1717 06/19/23 0452  NA 142 141  K 4.1 4.2  CL 108 107  CO2 19* 21*  GLUCOSE 316* 278*  BUN 35* 35*  CREATININE 1.92* 1.95*  CALCIUM 8.8* 8.4*  MG 2.0 1.6*  PHOS 5.7* 3.2   GFR: Estimated Creatinine Clearance: 34.3 mL/min (A) (by C-G formula based on SCr of 1.95 mg/dL (H)). Recent Labs  Lab 06/18/23 1717 06/18/23 2218 06/18/23 2342 06/19/23 0452 06/19/23 0837 06/19/23 1316  PROCALCITON  --  1.60  --   --   --   --   WBC 19.2*  --   --  21.4*  --   --   LATICACIDVEN 4.7* 4.6* 5.1*  --  5.6* 5.2*    Liver Function Tests: Recent Labs  Lab 06/18/23 1717  AST 20  ALT 10  ALKPHOS 62  BILITOT 0.6  PROT 7.8  ALBUMIN 3.1*   No results for input(s): "LIPASE", "AMYLASE" in the last 168 hours. No results for input(s): "AMMONIA" in the last 168 hours.  ABG    Component Value Date/Time   PHART 7.31 (L) 06/19/2023 0258   PCO2ART 41 06/19/2023 0258   PO2ART 83 06/19/2023 0258   HCO3 20.7 06/19/2023 0258   TCO2 25 05/06/2017 0329   ACIDBASEDEF 5.5 (H) 06/19/2023 0258   O2SAT 97.1 06/19/2023 0258     Coagulation Profile: Recent Labs  Lab 06/18/23 2218  INR 1.2    Cardiac Enzymes: No results for input(s): "CKTOTAL", "CKMB", "CKMBINDEX", "TROPONINI" in the last 168 hours.  HbA1C: Hgb A1c MFr Bld  Date/Time Value Ref Range Status  05/07/2017 05:00 AM 9.1 (H) 4.8 - 5.6 % Final    Comment:     (NOTE) Pre diabetes:          5.7%-6.4% Diabetes:              >6.4% Glycemic control for   <7.0% adults with diabetes     CBG: Recent Labs  Lab 06/18/23 2045 06/18/23 2138 06/18/23 2338 06/19/23 0343 06/19/23 1131  GLUCAP 311* 289* 229* 201* 255*     Past Medical History:  He,  has a past medical history of Cancer (HCC), COPD (chronic obstructive pulmonary disease) (HCC), Diabetes mellitus without complication (HCC), GERD without esophagitis, Hepatitis C virus, Hypercholesterolemia, Hypertension, and Seasonal allergies.   Surgical History:   Past Surgical History:  Procedure Laterality Date   COLONOSCOPY     approximately 11 years ago   FLEXIBLE BRONCHOSCOPY N/A 11/05/2016   Procedure: FLEXIBLE BRONCHOSCOPY;  Surgeon: Merwyn Katos, MD;  Location: [facility] ORS;  Service: Pulmonary;  Laterality: N/A;   IR FLUORO GUIDE CV LINE RIGHT  05/06/2017   IR INTRAVSC STENT CERV CAROTID W/O EMB-PROT MOD SED INC ANGIO  05/06/2017   IR PERCUTANEOUS ART THROMBECTOMY/INFUSION INTRACRANIAL INC DIAG ANGIO  05/06/2017   IR US GUIDE VASC ACCESS LEFT  05/06/2017   IR US GUIDE VASC ACCESS RIGHT  05/06/2017   IR US GUIDE VASC  ACCESS RIGHT  05/06/2017   PORTA CATH INSERTION N/A 11/23/2016   Procedure: Melville Stade Cath Insertion;  Surgeon: Celso College, MD;  Location: [facility] INVASIVE CV LAB;  Service: Cardiovascular;  Laterality: N/A;   RADIOLOGY WITH ANESTHESIA N/A 05/06/2017   Procedure: IR WITH ANESTHESIA;  Surgeon: Radiologist, Medication, MD;  Location: [facility] OR;  Service: Radiology;  Laterality: N/A;   TEE WITHOUT CARDIOVERSION N/A 05/10/2017   Procedure: TRANSESOPHAGEAL ECHOCARDIOGRAM (TEE);  Surgeon: Luana Rumple, MD;  Location: [facility] ENDOSCOPY;  Service: Cardiovascular;  Laterality: N/A;   TONSILLECTOMY       Social History:   reports that he quit smoking about 19 years ago. His smoking use included cigarettes. He started smoking about 69 years ago. He has a 50 pack-year smoking history. He has never used  smokeless tobacco. He reports that he does not drink alcohol and does not use drugs.   Family History:  His family history includes Lung cancer (age of onset: 23) in his sister; Ovarian cancer in his sister; Skin cancer in his mother and sister; Throat cancer (age of onset: 81) in his brother; Throat cancer (age of onset: 44) in his brother.   Allergies Allergies  Allergen Reactions   Penicillin G Anaphylaxis    Other reaction(s): Other (See Comments) Couldn't breath among other things Has patient had a PCN reaction causing immediate rash, facial/tongue/throat swelling, SOB or lightheadedness with hypotension: Yes Has patient had a PCN reaction causing severe rash involving mucus membranes or skin necrosis: No Has patient had a PCN reaction that required hospitalization: Yes Has patient had a PCN reaction occurring within the last 10 years: No If all of the above answers are "NO", then may proceed with Ce   Shellfish-Derived Products Anaphylaxis   Statins     Other reaction(s): Other (See Comments) Muscle spasms - can't walk - couldn't turn over in bed   Erythromycin Itching    All mycins   Tamsulosin     Other reaction(s): Dizziness   Tuberculin Ppd     Other reaction(s): Other (See Comments) False test    Iodinated Contrast Media Hives     Home Medications  Prior to Admission medications   Medication Sig Start Date End Date Taking? Authorizing Provider  Apple Cider Vinegar 188 MG CAPS Take by mouth.    [provider]  aspirin 81 MG chewable tablet Chew 4 tablets (324 mg total) by mouth daily. Patient taking differently: Chew 81 mg by mouth daily. 06/15/17   Love, Renay Carota, PA-C  blood glucose meter kit and supplies KIT Dispense based on patient and insurance preference. Use up to four times daily as directed. ICD: E11.9 06/15/17   Love, Renay Carota, PA-C  citalopram (CELEXA) 10 MG tablet Take 10 mg by mouth daily.    [provider]  colchicine 0.6 MG tablet Take  by mouth. 05/29/22   [provider]  ezetimibe (ZETIA) 10 MG tablet Take 10 mg by mouth daily. 06/08/23   [provider]  Fluticasone-Umeclidin-Vilant (TRELEGY ELLIPTA) 100-62.5-25 MCG/ACT AEPB Inhale 1 puff into the lungs daily. 06/16/23   Assaker, Marianne Shirts, MD  Fluticasone-Umeclidin-Vilant (TRELEGY ELLIPTA) 100-62.5-25 MCG/ACT AEPB Inhale 1 puff into the lungs daily. 06/16/23   Annitta Kindler, MD  Garlic 2 MG CAPS Take by mouth.    [provider]  glimepiride (AMARYL) 4 MG tablet Take 1 tablet (4 mg total) by mouth daily with breakfast. Patient taking differently: Take 2 mg by mouth 2 (two) times daily. 06/15/17  Love, Pamela S, PA-C  insulin glargine (LANTUS) 100 UNIT/ML injection Inject 10-12 Units into the skin daily.    [provider]  Omega-3 Fatty Acids (OMEGA-3 FISH OIL PO) Take by mouth.    [provider]  pantoprazole (PROTONIX) 40 MG tablet Take 1 tablet (40 mg total) by mouth daily. 06/15/17   Love, Renay Carota, PA-C  protein supplement shake (PREMIER PROTEIN) LIQD Take 325 mLs (11 oz total) by mouth 4 (four) times daily. 06/14/17   Love, Renay Carota, PA-C  senna-docusate (SENOKOT-S) 8.6-50 MG tablet Take 1 tablet by mouth at bedtime as needed for mild constipation. 05/11/17   Rodman Clam, NP        Critical Care Time devoted to patient care services described in this note is 75 minutes.  Critical care was necessary to treat /prevent imminent and life-threatening deterioration. Overall, patient is critically ill, prognosis is guarded.  Patient with Multiorgan failure and at high risk for cardiac arrest and death.    Lady Pier, M.D.  [facility]  Medical Director [facility] Medical Director [facility] Cardio-Pulmonary Department

## 2023-06-19 NOTE — Progress Notes (Signed)
 PHARMACY - ANTICOAGULATION CONSULT NOTE  Pharmacy Consult for Heparin  Indication: chest pain/ACS  Allergies  Allergen Reactions   Penicillin G Anaphylaxis    Other reaction(s): Other (See Comments) Couldn't breath among other things Has patient had a PCN reaction causing immediate rash, facial/tongue/throat swelling, SOB or lightheadedness with hypotension: Yes Has patient had a PCN reaction causing severe rash involving mucus membranes or skin necrosis: No Has patient had a PCN reaction that required hospitalization: Yes Has patient had a PCN reaction occurring within the last 10 years: No If all of the above answers are "NO", then may proceed with Ce   Shellfish-Derived Products Anaphylaxis   Statins     Other reaction(s): Other (See Comments) Muscle spasms - can't walk - couldn't turn over in bed   Erythromycin Itching    All mycins   Tamsulosin     Other reaction(s): Dizziness   Tuberculin Ppd     Other reaction(s): Other (See Comments) False test    Iodinated Contrast Media Hives    Patient Measurements: Height: 6\' 3"  (190.5 cm) Weight: 87.7 kg (193 lb 5.5 oz) IBW/kg (Calculated) : 84.5 HEPARIN DW (KG): 95.3  Vital Signs: Temp: 96.8 F (36 C) (04/12 0630) Temp Source: Bladder (04/12 0245) BP: 124/72 (04/12 0630) Pulse Rate: 66 (04/12 0630)  Labs: Recent Labs    06/18/23 1717 06/18/23 2218 06/18/23 2341 06/19/23 0452  HGB 12.8*  --   --  11.9*  HCT 40.1  --   --  36.3*  PLT 426*  --   --  398  LABPROT  --  15.3*  --   --   INR  --  1.2  --   --   CREATININE 1.92*  --   --  1.95*  TROPONINIHS 96* 4,173* 5,798* 6,129*    Estimated Creatinine Clearance: 34.3 mL/min (A) (by C-G formula based on SCr of 1.95 mg/dL (H)).   Medical History: Past Medical History:  Diagnosis Date   Cancer (HCC)    COPD (chronic obstructive pulmonary disease) (HCC)    Diabetes mellitus without complication (HCC)    GERD without esophagitis    Hepatitis C virus    history  of hep C treated with Harvoni   Hypercholesterolemia    Hypertension    Seasonal allergies     Medications:  Medications Prior to Admission  Medication Sig Dispense Refill Last Dose/Taking   Apple Cider Vinegar 188 MG CAPS Take by mouth.   Unknown   aspirin 81 MG chewable tablet Chew 4 tablets (324 mg total) by mouth daily. (Patient taking differently: Chew 81 mg by mouth daily.)   Unknown   blood glucose meter kit and supplies KIT Dispense based on patient and insurance preference. Use up to four times daily as directed. ICD: E11.9 1 each 0 Unknown   citalopram (CELEXA) 10 MG tablet Take 10 mg by mouth daily.   Unknown   colchicine 0.6 MG tablet Take by mouth.   Unknown   ezetimibe (ZETIA) 10 MG tablet Take 10 mg by mouth daily.   Unknown   Fluticasone-Umeclidin-Vilant (TRELEGY ELLIPTA) 100-62.5-25 MCG/ACT AEPB Inhale 1 puff into the lungs daily. 3 each 3 Unknown   Fluticasone-Umeclidin-Vilant (TRELEGY ELLIPTA) 100-62.5-25 MCG/ACT AEPB Inhale 1 puff into the lungs daily. 14 each 0 Unknown   Garlic 2 MG CAPS Take by mouth.   Unknown   glimepiride (AMARYL) 4 MG tablet Take 1 tablet (4 mg total) by mouth daily with breakfast. (Patient taking differently: Take 2  mg by mouth 2 (two) times daily.) 30 tablet 0 Unknown   insulin glargine (LANTUS) 100 UNIT/ML injection Inject 10-12 Units into the skin daily.   Unknown   Omega-3 Fatty Acids (OMEGA-3 FISH OIL PO) Take by mouth.   Unknown   pantoprazole (PROTONIX) 40 MG tablet Take 1 tablet (40 mg total) by mouth daily. 30 tablet 0 Unknown   protein supplement shake (PREMIER PROTEIN) LIQD Take 325 mLs (11 oz total) by mouth 4 (four) times daily.  0 Unknown   senna-docusate (SENOKOT-S) 8.6-50 MG tablet Take 1 tablet by mouth at bedtime as needed for mild constipation.   Unknown    Assessment:  83 y.o. male with history of COPD prior small cell lung cancer, DM 2, HTN.  Pharmacy consulted to dose heparin , admitted with ACS/NSTEMI.  Pt was on heparin  5000 units SQ Q8H, last dose on 4/11 @ 2214.  Goal of Therapy:  Heparin level 0.3-0.7 units/ml Monitor platelets by anticoagulation protocol: Yes   Plan: heparin level supratherapeutic --hold heparin infusion x 1 hours --reduce heparin infusion to 1000 units/hr --Check anti-Xa level in 8 hours after rate change and at least once daily while on heparin --Continue to monitor H&H and platelets  Edwin Jones 06/19/2023,7:09 AM

## 2023-06-19 NOTE — Progress Notes (Signed)
 Patients wedding ring given to wife, [name] King.

## 2023-06-19 NOTE — Progress Notes (Signed)
 PHARMACY - ANTICOAGULATION CONSULT NOTE  Pharmacy Consult for Heparin  Indication: chest pain/ACS  Allergies  Allergen Reactions   Penicillin G Anaphylaxis    Other reaction(s): Other (See Comments) Couldn't breath among other things Has patient had a PCN reaction causing immediate rash, facial/tongue/throat swelling, SOB or lightheadedness with hypotension: Yes Has patient had a PCN reaction causing severe rash involving mucus membranes or skin necrosis: No Has patient had a PCN reaction that required hospitalization: Yes Has patient had a PCN reaction occurring within the last 10 years: No If all of the above answers are "NO", then may proceed with Ce   Shellfish-Derived Products Anaphylaxis   Statins     Other reaction(s): Other (See Comments) Muscle spasms - can't walk - couldn't turn over in bed   Erythromycin Itching    All mycins   Tamsulosin     Other reaction(s): Dizziness   Tuberculin Ppd     Other reaction(s): Other (See Comments) False test    Iodinated Contrast Media Hives    Patient Measurements: Height: 6\' 3"  (190.5 cm) Weight: 87.7 kg (193 lb 5.5 oz) IBW/kg (Calculated) : 84.5 HEPARIN DW (KG): 95.3  Vital Signs: Temp: 100 F (37.8 C) (04/12 1900) Temp Source: Bladder (04/12 1600) BP: 115/61 (04/12 1900) Pulse Rate: 87 (04/12 1900)  Labs: Recent Labs    06/18/23 1717 06/18/23 2218 06/18/23 2341 06/19/23 0452 06/19/23 0639 06/19/23 0837 06/19/23 2009  HGB 12.8*  --   --  11.9*  --   --   --   HCT 40.1  --   --  36.3*  --   --   --   PLT 426*  --   --  398  --   --   --   LABPROT  --  15.3*  --   --   --   --   --   INR  --  1.2  --   --   --   --   --   HEPARINUNFRC  --   --   --   --   --  1.09* 0.76*  CREATININE 1.92*  --   --  1.95*  --   --   --   TROPONINIHS 96* 4,173* 5,798* 6,129* 6,254*  --   --     Estimated Creatinine Clearance: 34.3 mL/min (A) (by C-G formula based on SCr of 1.95 mg/dL (H)).   Medical History: Past Medical  History:  Diagnosis Date   Cancer (HCC)    COPD (chronic obstructive pulmonary disease) (HCC)    Diabetes mellitus without complication (HCC)    GERD without esophagitis    Hepatitis C virus    history of hep C treated with Harvoni   Hypercholesterolemia    Hypertension    Seasonal allergies     Medications:  Medications Prior to Admission  Medication Sig Dispense Refill Last Dose/Taking   ascorbic acid (VITAMIN C) 500 MG tablet Take 500 mg by mouth daily.   Past Week   aspirin 81 MG chewable tablet Chew 4 tablets (324 mg total) by mouth daily. (Patient taking differently: Chew 81 mg by mouth daily.)   Past Week   cholecalciferol (VITAMIN D3) 25 MCG (1000 UNIT) tablet Take 1,000 Units by mouth daily.   Past Week   citalopram (CELEXA) 20 MG tablet Take 20 mg by mouth daily.   Past Week   cyanocobalamin (VITAMIN B12) 500 MCG tablet Take 500 mcg by mouth daily.   Past Week  ezetimibe (ZETIA) 10 MG tablet Take 10 mg by mouth daily.   Past Week   Fluticasone-Umeclidin-Vilant (TRELEGY ELLIPTA) 100-62.5-25 MCG/ACT AEPB Inhale 1 puff into the lungs daily. 14 each 0 Past Week   Garlic 2 MG CAPS Take by mouth.   Past Week   glimepiride (AMARYL) 4 MG tablet Take 1 tablet (4 mg total) by mouth daily with breakfast. 30 tablet 0 Past Week   insulin glargine (LANTUS) 100 UNIT/ML injection Inject 10-12 Units into the skin daily.   Past Week   Omega-3 Fatty Acids (OMEGA-3 FISH OIL PO) Take by mouth.   Past Week   pantoprazole (PROTONIX) 40 MG tablet Take 1 tablet (40 mg total) by mouth daily. 30 tablet 0 Past Week   zinc sulfate, 50mg  elemental zinc, 220 (50 Zn) MG capsule Take 220 mg by mouth daily.   Past Week   Apple Cider Vinegar 188 MG CAPS Take by mouth.   Unknown   blood glucose meter kit and supplies KIT Dispense based on patient and insurance preference. Use up to four times daily as directed. ICD: E11.9 1 each 0 Unknown   colchicine 0.6 MG tablet Take by mouth.   Unknown    Fluticasone-Umeclidin-Vilant (TRELEGY ELLIPTA) 100-62.5-25 MCG/ACT AEPB Inhale 1 puff into the lungs daily. 3 each 3 Unknown   protein supplement shake (PREMIER PROTEIN) LIQD Take 325 mLs (11 oz total) by mouth 4 (four) times daily.  0 Unknown    Assessment:  83 y.o. male with history of COPD prior small cell lung cancer, DM 2, HTN.  Pharmacy consulted to dose heparin , admitted with ACS/NSTEMI.  Pt was on heparin 5000 units SQ Q8H, last dose on 4/11 @ 2214.  Date Time HL Rate/Comment 4/12 1610 1.09 SUPRAtherapeutic 4/12 2009 0.76 SUPRAtherapeutic  Goal of Therapy:  Heparin level 0.3-0.7 units/ml Monitor platelets by anticoagulation protocol: Yes   Plan: No issues with infusion reported, per RN no signs/symptoms of bleeding noted Decrease heparin infusion to 900 units/hr Check anti-Xa level in 8 hours after rate change and at least once daily while on heparin Continue to monitor H&H and platelets  Thank you for involving pharmacy in this patient's care.   Ananias Balls, PharmD Clinical Pharmacist 06/19/2023 8:31 PM

## 2023-06-19 NOTE — Progress Notes (Signed)
 PHARMACY CONSULT NOTE - FOLLOW UP  Pharmacy Consult for Electrolyte Monitoring and Replacement   Recent Labs: Potassium (mmol/L)  Date Value  06/19/2023 4.2   Magnesium (mg/dL)  Date Value  56/43/3295 1.6 (L)   Calcium (mg/dL)  Date Value  18/84/1660 8.4 (L)   Albumin (g/dL)  Date Value  63/03/6008 3.1 (L)   Phosphorus (mg/dL)  Date Value  93/23/5573 3.2   Sodium (mmol/L)  Date Value  06/19/2023 141     Assessment: 83 y.o. male with history of COPD prior small cell lung cancer, DM 2, HTN.  Pharmacy is asked to follow and replace electrolytes while in CCU  Goal of Therapy:  Electrolytes WNL  Plan:  ---2 grams IV magnesium sulfate x 1 ---recheck electrolytes in am  Adalberto Acton ,PharmD Clinical Pharmacist 06/19/2023 11:14 AM

## 2023-06-20 DIAGNOSIS — I5031 Acute diastolic (congestive) heart failure: Secondary | ICD-10-CM | POA: Diagnosis not present

## 2023-06-20 DIAGNOSIS — J9601 Acute respiratory failure with hypoxia: Secondary | ICD-10-CM | POA: Diagnosis not present

## 2023-06-20 DIAGNOSIS — J9602 Acute respiratory failure with hypercapnia: Secondary | ICD-10-CM | POA: Diagnosis not present

## 2023-06-20 DIAGNOSIS — I214 Non-ST elevation (NSTEMI) myocardial infarction: Secondary | ICD-10-CM | POA: Diagnosis not present

## 2023-06-20 DIAGNOSIS — J441 Chronic obstructive pulmonary disease with (acute) exacerbation: Secondary | ICD-10-CM | POA: Diagnosis not present

## 2023-06-20 LAB — CBC
HCT: 32.5 % — ABNORMAL LOW (ref 39.0–52.0)
Hemoglobin: 10.6 g/dL — ABNORMAL LOW (ref 13.0–17.0)
MCH: 31 pg (ref 26.0–34.0)
MCHC: 32.6 g/dL (ref 30.0–36.0)
MCV: 95 fL (ref 80.0–100.0)
Platelets: 320 10*3/uL (ref 150–400)
RBC: 3.42 MIL/uL — ABNORMAL LOW (ref 4.22–5.81)
RDW: 15.3 % (ref 11.5–15.5)
WBC: 16.8 10*3/uL — ABNORMAL HIGH (ref 4.0–10.5)
nRBC: 0 % (ref 0.0–0.2)

## 2023-06-20 LAB — RENAL FUNCTION PANEL
Albumin: 2.2 g/dL — ABNORMAL LOW (ref 3.5–5.0)
Anion gap: 10 (ref 5–15)
BUN: 35 mg/dL — ABNORMAL HIGH (ref 8–23)
CO2: 23 mmol/L (ref 22–32)
Calcium: 8.2 mg/dL — ABNORMAL LOW (ref 8.9–10.3)
Chloride: 106 mmol/L (ref 98–111)
Creatinine, Ser: 1.84 mg/dL — ABNORMAL HIGH (ref 0.61–1.24)
GFR, Estimated: 36 mL/min — ABNORMAL LOW (ref >60)
Glucose, Bld: 259 mg/dL — ABNORMAL HIGH (ref 70–99)
Phosphorus: 2.3 mg/dL — ABNORMAL LOW (ref 2.5–4.6)
Potassium: 3.7 mmol/L (ref 3.5–5.1)
Sodium: 139 mmol/L (ref 135–145)

## 2023-06-20 LAB — GLUCOSE, CAPILLARY
Glucose-Capillary: 209 mg/dL — ABNORMAL HIGH (ref 70–99)
Glucose-Capillary: 221 mg/dL — ABNORMAL HIGH (ref 70–99)
Glucose-Capillary: 282 mg/dL — ABNORMAL HIGH (ref 70–99)
Glucose-Capillary: 292 mg/dL — ABNORMAL HIGH (ref 70–99)

## 2023-06-20 LAB — MAGNESIUM: Magnesium: 2.2 mg/dL (ref 1.7–2.4)

## 2023-06-20 LAB — LACTIC ACID, PLASMA: Lactic Acid, Venous: 1.6 mmol/L (ref 0.5–1.9)

## 2023-06-20 LAB — HEPARIN LEVEL (UNFRACTIONATED)
Heparin Unfractionated: 0.55 [IU]/mL (ref 0.30–0.70)
Heparin Unfractionated: 0.45 [IU]/mL (ref 0.30–0.70)

## 2023-06-20 LAB — PROCALCITONIN: Procalcitonin: 3.98 ng/mL

## 2023-06-20 LAB — TROPONIN I (HIGH SENSITIVITY): Troponin I (High Sensitivity): 2700 ng/L (ref <18)

## 2023-06-20 MED ORDER — FENTANYL CITRATE PF 50 MCG/ML IJ SOSY: 25.0000 ug | PREFILLED_SYRINGE | INTRAMUSCULAR | Status: DC | PRN

## 2023-06-20 MED ORDER — FENTANYL CITRATE PF 50 MCG/ML IJ SOSY
25.0000 ug | PREFILLED_SYRINGE | INTRAMUSCULAR | Status: DC | PRN
  Administered 2023-06-20: 100 ug via INTRAVENOUS
  Administered 2023-06-20: 50 ug via INTRAVENOUS
  Administered 2023-06-21 – 2023-06-22 (×7): 100 ug via INTRAVENOUS
  Filled 2023-06-20: qty 2

## 2023-06-20 MED ORDER — POTASSIUM PHOSPHATES 15 MMOLE/5ML IV SOLN
15.0000 mmol | Freq: Once | INTRAVENOUS | Status: AC
  Administered 2023-06-20: 15 mmol via INTRAVENOUS
  Filled 2023-06-20: qty 5

## 2023-06-20 MED ORDER — FREE WATER
30.0000 mL | Status: DC
  Administered 2023-06-20 – 2023-06-23 (×17): 30 mL

## 2023-06-20 NOTE — Progress Notes (Signed)
 NAME:  Geoff Dacanay, MRN:  [id number], DOB:  01/13/1941, LOS: 2 ADMISSION DATE:  06/18/2023, CONSULTATION DATE: 06/18/2023  History of Present Illness:  Mr. Hyndman is an 83 year old African-American male with a history of type 2 diabetes, hypertension, small cell lung cancer, and COPD who presented to the ED via EMS with complaints of respiratory distress.  History is obtained from EMS records as well as ED records as patient is currently intubated and sedated and there is no family at the bedside.  Per ED records, EMS was called for respiratory distress.  Upon EMS arrival, patient's oxygen saturation was 60%.  Bag mask ventilation was started and patient was transported to the ED after an unsuccessful intubation attempt in the field.  Upon ED arrival, patient had copious amounts of secretions in his oral cavity.  Initial ED workup revealed multifocal pneumonia with sepsis.  Patient was placed on BiPAP however he became combative and started coughing profusely and also had large amounts of secretions that filled up the BiPAP mask.  His oxygen saturation dropped and he was emergently intubated.  PCCM was consulted for further management. ED course: His initial venous gas in the ED showed a pH of 7.25, PCO2 of 49, and PaO2 of 54.  Chemistry showed CO2 level of 19, glucose level of 360 mg/dL, BUN of 35, and a creatinine of 1.90 which is close to his baseline.  CBC showed a WBC of 19.2 platelets of 426, neutrophils of 15.0.  His respiratory panel was negative for influenza and COVID as well as a RSV.  His chest x-ray showed multifocal pneumonia.  He was started on broad-spectrum antibiotics and fluid resuscitation was also initiated.  Postintubation, patient became hypotensive requiring pressors.  He was started on peripheral norepinephrine. Upon arrival in the ICU, patient was intubated and sedated.  His initial troponin was 96 however the second troponin peaked to 4173.  Lactic acid was 4.7 and peaked at 5.1.   He was started on a heparin infusion and cardiology was consulted.  A 2D echo was ordered.  He was also given multiple fluid boluses.   Pertinent  Medical History  COPD  Type II Diabetes Mellitus  GERD  Hepatitis C (treated with harvoni) Hypercholesterolemia  HTN  Seasonal Allergies  Small Cell Lung Cancer diagnosed in 2018 s/p chemoradiation in remission   Anti-infectives (From admission, onward)    Start     Dose/Rate Route Frequency Ordered Stop   06/19/23 1700  azithromycin (ZITHROMAX) 500 mg in sodium chloride 0.9 % 250 mL IVPB        500 mg 250 mL/hr over 60 Minutes Intravenous Every 24 hours 06/18/23 2113     06/19/23 1200  ceFEPIme (MAXIPIME) 2 g in sodium chloride 0.9 % 100 mL IVPB        2 g 200 mL/hr over 30 Minutes Intravenous Every 12 hours 06/19/23 1051     06/18/23 2130  vancomycin (VANCOREADY) IVPB 2000 mg/400 mL        2,000 mg 200 mL/hr over 120 Minutes Intravenous  Once 06/18/23 2053 06/19/23 0046   06/18/23 2115  cefTRIAXone (ROCEPHIN) 1 g in sodium chloride 0.9 % 100 mL IVPB  Status:  Discontinued        1 g 200 mL/hr over 30 Minutes Intravenous Every 24 hours 06/18/23 2113 06/19/23 1051   06/18/23 2101  vancomycin variable dose per unstable renal function (pharmacist dosing)  Status:  Discontinued         Does not  apply See admin instructions 06/18/23 2102 06/19/23 1050   06/18/23 1730  levofloxacin (LEVAQUIN) IVPB 750 mg        750 mg 100 mL/hr over 90 Minutes Intravenous  Once 06/18/23 1720 06/18/23 1918       Significant Hospital Events: Including procedures, antibiotic start and stop dates in addition to other pertinent events   04/11: Admitted with multifocal pneumonia, NSTEMI, sepsis and septic shock requiring mechanical intubation  04/13: Pt remains mechanically intubated on minimal vent settings.  Will perform WUA and SBT once spouse arrives at bedside   Interim History / Subjective:  No acute events overnight   Objective   Blood pressure  106/61, pulse (!) 48, temperature 98.8 F (37.1 C), resp. rate 18, height 6\' 3"  (1.905 m), weight 87.8 kg, SpO2 97%.    Vent Mode: PRVC FiO2 (%):  [30 %-40 %] 30 % Set Rate:  [18 bmp] 18 bmp Vt Set:  [550 mL] 550 mL PEEP:  [5 cmH20-8 cmH20] 5 cmH20 Plateau Pressure:  [17 cmH20-23 cmH20] 23 cmH20   Intake/Output Summary (Last 24 hours) at 06/20/2023 1100 Last data filed at 06/20/2023 1000 Gross per 24 hour  Intake 3972.75 ml  Output 1370 ml  Net 2602.75 ml   Filed Weights   06/18/23 1710 06/19/23 0515 06/20/23 0318  Weight: 95.3 kg 87.7 kg 87.8 kg    Examination: General: Acute on chronically-ill appearing male, NAD mechanically intubated HENT: Supple, no JVD Lungs: Faint rhonchi throughout, even, non labored  Cardiovascular: NSR, s1s2, no m/r/g, 2+ radial/1+ distal pulses, no edema  Abdomen: +BS x4, soft, non distended  Extremities: Normal bulk and tone  Neuro: Sedated, not following commands, withdraws from painful stimulation, PERRL GU: Indwelling foley catheter draining yellow urine   Resolved Hospital Problem list     Assessment & Plan:   #Acute metabolic encephalopathy  #Mechanical intubation pain/discomfort  - Treat metabolic derangements  - Avoid sedating medications as able  - Maintain RASS goal 0 to -1 - PAD protocol to maintain RASS goal: propofol and fentanyl gtts  - WUA daily  #NSTEMI  #Hypotension secondary  Echo 06/19/23: EF 50 to 55%; grade I diastolic dysfunction; mild mitral valve regurgitation; trivial aortic valve regurgitation  - Continuous telemetry monitoring  - Trend troponin until peaked  - Continue heparin gtt  - Prn levophed gtt to maintain map 65 or higher  - Cardiology consulted appreciate input   #Acute respiratory failure  #Pneumonia  #Mechanical intubation  - Full vent support for now: vent settings reviewed and established  - Continue lung protective strategies  - Maintain plateau pressures less than 30 cm H2O - SBT once all  parameters met  - Intermittent CXR's and ABG's  - Scheduled bronchodilator therapy  - Nebulized steroids   #Acute kidney injury likely secondary to ATN #Lactic acidosis   #Hypophosphatemia  - Trend BMP and lactic acid  - Replace electrolytes as indicated  - Strict I&O's - Avoid nephrotoxic agents   #Pneumonia  - Trend WBC and monitor fever curve  - Trend PCT - Follow cultures  - Continue abx as outlined above  #Anemia without obvious signs of bleeding  - Trend CBC  - Monitor for s/sx of bleeding  - Transfuse for hgb <7  #Type II diabetes mellitus  - CBG's q4hrs  - SSI  - Target CBG range 140 to 180 - Follow hypo/hyperglycemic protocol   Best Practice (right click and "Reselect all SmartList Selections" daily)   Diet/type: tubefeeds DVT prophylaxis  systemic heparin Pressure ulcer(s): N/A GI prophylaxis: H2B Lines: Portacath preset on admission  Foley:  Indwelling foley catheter present and still needed  Code Status:  full code Last date of multidisciplinary goals of care discussion [06/20/2023]  04/13: Updated pts spouse via telephone regarding pts condition and current plan of care.  She will arrive at bedside during WUA and SBT today at noon  Labs   CBC: Recent Labs  Lab 06/18/23 1717 06/19/23 0452 06/20/23 0248  WBC 19.2* 21.4* 16.8*  NEUTROABS 15.0*  --   --   HGB 12.8* 11.9* 10.6*  HCT 40.1 36.3* 32.5*  MCV 97.3 96.3 95.0  PLT 426* 398 320    Basic Metabolic Panel: Recent Labs  Lab 06/18/23 1717 06/19/23 0452 06/20/23 0248  NA 142 141 139  K 4.1 4.2 3.7  CL 108 107 106  CO2 19* 21* 23  GLUCOSE 316* 278* 259*  BUN 35* 35* 35*  CREATININE 1.92* 1.95* 1.84*  CALCIUM 8.8* 8.4* 8.2*  MG 2.0 1.6* 2.2  PHOS 5.7* 3.2 2.3*   GFR: Estimated Creatinine Clearance: 36.4 mL/min (A) (by C-G formula based on SCr of 1.84 mg/dL (H)). Recent Labs  Lab 06/18/23 1717 06/18/23 2218 06/18/23 2342 06/19/23 0452 06/19/23 0837 06/19/23 1316 06/19/23 1549  06/20/23 0248  PROCALCITON  --  1.60  --   --   --   --   --   --   WBC 19.2*  --   --  21.4*  --   --   --  16.8*  LATICACIDVEN 4.7* 4.6* 5.1*  --  5.6* 5.2* 4.2*  --     Liver Function Tests: Recent Labs  Lab 06/18/23 1717 06/20/23 0248  AST 20  --   ALT 10  --   ALKPHOS 62  --   BILITOT 0.6  --   PROT 7.8  --   ALBUMIN 3.1* 2.2*   No results for input(s): "LIPASE", "AMYLASE" in the last 168 hours. No results for input(s): "AMMONIA" in the last 168 hours.  ABG    Component Value Date/Time   PHART 7.31 (L) 06/19/2023 0258   PCO2ART 41 06/19/2023 0258   PO2ART 83 06/19/2023 0258   HCO3 20.7 06/19/2023 0258   TCO2 25 05/06/2017 0329   ACIDBASEDEF 5.5 (H) 06/19/2023 0258   O2SAT 97.1 06/19/2023 0258     Coagulation Profile: Recent Labs  Lab 06/18/23 2218  INR 1.2    Cardiac Enzymes: No results for input(s): "CKTOTAL", "CKMB", "CKMBINDEX", "TROPONINI" in the last 168 hours.  HbA1C: Hgb A1c MFr Bld  Date/Time Value Ref Range Status  05/07/2017 05:00 AM 9.1 (H) 4.8 - 5.6 % Final    Comment:    (NOTE) Pre diabetes:          5.7%-6.4% Diabetes:              >6.4% Glycemic control for   <7.0% adults with diabetes     CBG: Recent Labs  Lab 06/19/23 0343 06/19/23 1131 06/19/23 1615 06/20/23 0000 06/20/23 0803  GLUCAP 201* 255* 207* 209* 292*    Review of Systems:   Unable to assess pt mechanically intubated   Past Medical History:  He,  has a past medical history of Cancer (HCC), COPD (chronic obstructive pulmonary disease) (HCC), Diabetes mellitus without complication (HCC), GERD without esophagitis, Hepatitis C virus, Hypercholesterolemia, Hypertension, and Seasonal allergies.   Surgical History:   Past Surgical History:  Procedure Laterality Date   COLONOSCOPY  approximately 11 years ago   FLEXIBLE BRONCHOSCOPY N/A 11/05/2016   Procedure: FLEXIBLE BRONCHOSCOPY;  Surgeon: Merwyn Katos, MD;  Location: [facility] ORS;  Service: Pulmonary;   Laterality: N/A;   IR FLUORO GUIDE CV LINE RIGHT  05/06/2017   IR INTRAVSC STENT CERV CAROTID W/O EMB-PROT MOD SED INC ANGIO  05/06/2017   IR PERCUTANEOUS ART THROMBECTOMY/INFUSION INTRACRANIAL INC DIAG ANGIO  05/06/2017   IR US GUIDE VASC ACCESS LEFT  05/06/2017   IR US GUIDE VASC ACCESS RIGHT  05/06/2017   IR US GUIDE VASC ACCESS RIGHT  05/06/2017   PORTA CATH INSERTION N/A 11/23/2016   Procedure: Shelda Pal Cath Insertion;  Surgeon: Annice Needy, MD;  Location: [facility] INVASIVE CV LAB;  Service: Cardiovascular;  Laterality: N/A;   RADIOLOGY WITH ANESTHESIA N/A 05/06/2017   Procedure: IR WITH ANESTHESIA;  Surgeon: Radiologist, Medication, MD;  Location: [facility] OR;  Service: Radiology;  Laterality: N/A;   TEE WITHOUT CARDIOVERSION N/A 05/10/2017   Procedure: TRANSESOPHAGEAL ECHOCARDIOGRAM (TEE);  Surgeon: Thurmon Fair, MD;  Location: [facility] ENDOSCOPY;  Service: Cardiovascular;  Laterality: N/A;   TONSILLECTOMY       Social History:   reports that he quit smoking about 19 years ago. His smoking use included cigarettes. He started smoking about 69 years ago. He has a 50 pack-year smoking history. He has never used smokeless tobacco. He reports that he does not drink alcohol and does not use drugs.   Family History:  His family history includes Lung cancer (age of onset: 55) in his sister; Ovarian cancer in his sister; Skin cancer in his mother and sister; Throat cancer (age of onset: 62) in his brother; Throat cancer (age of onset: 27) in his brother.   Allergies Allergies  Allergen Reactions   Penicillin G Anaphylaxis    Other reaction(s): Other (See Comments) Couldn't breath among other things Has patient had a PCN reaction causing immediate rash, facial/tongue/throat swelling, SOB or lightheadedness with hypotension: Yes Has patient had a PCN reaction causing severe rash involving mucus membranes or skin necrosis: No Has patient had a PCN reaction that required hospitalization: Yes Has patient had a PCN  reaction occurring within the last 10 years: No If all of the above answers are "NO", then may proceed with Ce   Shellfish-Derived Products Anaphylaxis   Statins     Other reaction(s): Other (See Comments) Muscle spasms - can't walk - couldn't turn over in bed   Erythromycin Itching    All mycins   Tamsulosin     Other reaction(s): Dizziness   Tuberculin Ppd     Other reaction(s): Other (See Comments) False test    Iodinated Contrast Media Hives     Home Medications  Prior to Admission medications   Medication Sig Start Date End Date Taking? Authorizing Provider  ascorbic acid (VITAMIN C) 500 MG tablet Take 500 mg by mouth daily.   Yes [provider]  aspirin 81 MG chewable tablet Chew 4 tablets (324 mg total) by mouth daily. Patient taking differently: Chew 81 mg by mouth daily. 06/15/17  Yes Love, Evlyn Kanner, PA-C  cholecalciferol (VITAMIN D3) 25 MCG (1000 UNIT) tablet Take 1,000 Units by mouth daily.   Yes [provider]  citalopram (CELEXA) 20 MG tablet Take 20 mg by mouth daily.   Yes [provider]  cyanocobalamin (VITAMIN B12) 500 MCG tablet Take 500 mcg by mouth daily.   Yes [provider]  ezetimibe (ZETIA) 10 MG tablet Take 10 mg by mouth  daily. 06/08/23  Yes [provider]  Fluticasone-Umeclidin-Vilant (TRELEGY ELLIPTA) 100-62.5-25 MCG/ACT AEPB Inhale 1 puff into the lungs daily. 06/16/23  Yes Assaker, Marianne Shirts, MD  Garlic 2 MG CAPS Take by mouth.   Yes [provider]  glimepiride (AMARYL) 4 MG tablet Take 1 tablet (4 mg total) by mouth daily with breakfast. 06/15/17  Yes Love, Renay Carota, PA-C  insulin glargine (LANTUS) 100 UNIT/ML injection Inject 10-12 Units into the skin daily.   Yes [provider]  Omega-3 Fatty Acids (OMEGA-3 FISH OIL PO) Take by mouth.   Yes [provider]  pantoprazole (PROTONIX) 40 MG tablet Take 1 tablet (40 mg total) by mouth daily. 06/15/17  Yes Love, Renay Carota, PA-C  zinc  sulfate, 50mg  elemental zinc, 220 (50 Zn) MG capsule Take 220 mg by mouth daily.   Yes [provider]  Apple Cider Vinegar 188 MG CAPS Take by mouth.    [provider]  blood glucose meter kit and supplies KIT Dispense based on patient and insurance preference. Use up to four times daily as directed. ICD: E11.9 06/15/17   Zelda Hickman, PA-C  colchicine 0.6 MG tablet Take by mouth. 05/29/22   [provider]  Fluticasone-Umeclidin-Vilant (TRELEGY ELLIPTA) 100-62.5-25 MCG/ACT AEPB Inhale 1 puff into the lungs daily. 06/16/23   Assaker, Marianne Shirts, MD  protein supplement shake (PREMIER PROTEIN) LIQD Take 325 mLs (11 oz total) by mouth 4 (four) times daily. 06/14/17   Zelda Hickman, PA-C     Critical care time: 45 minutes       Janey Meek, AGNP  Pulmonary/Critical Care Pager [phone] (please enter 7 digits) PCCM Consult Pager [phone] (please enter 7 digits)

## 2023-06-20 NOTE — Progress Notes (Signed)
 PHARMACY - ANTICOAGULATION CONSULT NOTE  Pharmacy Consult for Heparin  Indication: chest pain/ACS  Allergies  Allergen Reactions   Penicillin G Anaphylaxis    Other reaction(s): Other (See Comments) Couldn't breath among other things Has patient had a PCN reaction causing immediate rash, facial/tongue/throat swelling, SOB or lightheadedness with hypotension: Yes Has patient had a PCN reaction causing severe rash involving mucus membranes or skin necrosis: No Has patient had a PCN reaction that required hospitalization: Yes Has patient had a PCN reaction occurring within the last 10 years: No If all of the above answers are "NO", then may proceed with Ce   Shellfish-Derived Products Anaphylaxis   Statins     Other reaction(s): Other (See Comments) Muscle spasms - can't walk - couldn't turn over in bed   Erythromycin Itching    All mycins   Tamsulosin     Other reaction(s): Dizziness   Tuberculin Ppd     Other reaction(s): Other (See Comments) False test    Iodinated Contrast Media Hives    Patient Measurements: Height: 6\' 3"  (190.5 cm) Weight: 87.7 kg (193 lb 5.5 oz) IBW/kg (Calculated) : 84.5 HEPARIN DW (KG): 95.3  Vital Signs: Temp: 98.8 F (37.1 C) (04/13 1030) Temp Source: Bladder (04/13 0900) BP: 106/61 (04/13 1030) Pulse Rate: 48 (04/13 1030)  Labs: Recent Labs    06/18/23 1717 06/18/23 2218 06/18/23 2341 06/19/23 0452 06/19/23 0639 06/19/23 0837 06/19/23 2009 06/20/23 0242 06/20/23 0248 06/20/23 1113  HGB 12.8*  --   --  11.9*  --   --   --   --  10.6*  --   HCT 40.1  --   --  36.3*  --   --   --   --  32.5*  --   PLT 426*  --   --  398  --   --   --   --  320  --   LABPROT  --  15.3*  --   --   --   --   --   --   --   --   INR  --  1.2  --   --   --   --   --   --   --   --   HEPARINUNFRC  --   --   --   --   --    < > 0.76*  --  0.55 0.45  CREATININE 1.92*  --   --  1.95*  --   --   --   --  1.84*  --   TROPONINIHS 96* 4,173*   < > 6,129* 6,254*   --   --  2,700*  --   --    < > = values in this interval not displayed.    Estimated Creatinine Clearance: 36.4 mL/min (A) (by C-G formula based on SCr of 1.84 mg/dL (H)).   Medical History: Past Medical History:  Diagnosis Date   Cancer (HCC)    COPD (chronic obstructive pulmonary disease) (HCC)    Diabetes mellitus without complication (HCC)    GERD without esophagitis    Hepatitis C virus    history of hep C treated with Harvoni   Hypercholesterolemia    Hypertension    Seasonal allergies     Medications:  Medications Prior to Admission  Medication Sig Dispense Refill Last Dose/Taking   ascorbic acid (VITAMIN C) 500 MG tablet Take 500 mg by mouth daily.   Past Week   aspirin  81 MG chewable tablet Chew 4 tablets (324 mg total) by mouth daily. (Patient taking differently: Chew 81 mg by mouth daily.)   Past Week   cholecalciferol (VITAMIN D3) 25 MCG (1000 UNIT) tablet Take 1,000 Units by mouth daily.   Past Week   citalopram (CELEXA) 20 MG tablet Take 20 mg by mouth daily.   Past Week   cyanocobalamin (VITAMIN B12) 500 MCG tablet Take 500 mcg by mouth daily.   Past Week   ezetimibe (ZETIA) 10 MG tablet Take 10 mg by mouth daily.   Past Week   Fluticasone-Umeclidin-Vilant (TRELEGY ELLIPTA) 100-62.5-25 MCG/ACT AEPB Inhale 1 puff into the lungs daily. 14 each 0 Past Week   Garlic 2 MG CAPS Take by mouth.   Past Week   glimepiride (AMARYL) 4 MG tablet Take 1 tablet (4 mg total) by mouth daily with breakfast. 30 tablet 0 Past Week   insulin glargine (LANTUS) 100 UNIT/ML injection Inject 10-12 Units into the skin daily.   Past Week   Omega-3 Fatty Acids (OMEGA-3 FISH OIL PO) Take by mouth.   Past Week   pantoprazole (PROTONIX) 40 MG tablet Take 1 tablet (40 mg total) by mouth daily. 30 tablet 0 Past Week   zinc sulfate, 50mg  elemental zinc, 220 (50 Zn) MG capsule Take 220 mg by mouth daily.   Past Week   Apple Cider Vinegar 188 MG CAPS Take by mouth.   Unknown   blood glucose meter  kit and supplies KIT Dispense based on patient and insurance preference. Use up to four times daily as directed. ICD: E11.9 1 each 0 Unknown   colchicine 0.6 MG tablet Take by mouth.   Unknown   Fluticasone-Umeclidin-Vilant (TRELEGY ELLIPTA) 100-62.5-25 MCG/ACT AEPB Inhale 1 puff into the lungs daily. 3 each 3 Unknown   protein supplement shake (PREMIER PROTEIN) LIQD Take 325 mLs (11 oz total) by mouth 4 (four) times daily.  0 Unknown    Assessment:  83 y.o. male with history of COPD prior small cell lung cancer, DM 2, HTN.  Pharmacy consulted to dose heparin , admitted with ACS/NSTEMI.  Pt was on heparin 5000 units SQ Q8H, last dose on 4/11 @ 2214.  Date Time HL Rate/Comment 4/12 0837 1.09 SUPRAtherapeutic 4/12 2009 0.76 SUPRAtherapeutic 4/13     0248    0.55    Therapeutic X 1   Goal of Therapy:  Heparin level 0.3-0.7 units/ml Monitor platelets by anticoagulation protocol: Yes   Plan: heparin level therapeutic X 2 - Will continue heparin infusion at 900 units/hr --can check next heparin level in am 04/14 --Continue to monitor H&H and platelets  Thank you for involving pharmacy in this patient's care.   Barney Boozer, PharmD, BCPS Clinical Pharmacist 06/20/2023 11:49 AM

## 2023-06-20 NOTE — Progress Notes (Signed)
 PHARMACY CONSULT NOTE - FOLLOW UP  Pharmacy Consult for Electrolyte Monitoring and Replacement   Recent Labs: Potassium (mmol/L)  Date Value  06/20/2023 3.7   Magnesium (mg/dL)  Date Value  91/47/8295 2.2   Calcium (mg/dL)  Date Value  62/13/0865 8.2 (L)   Albumin (g/dL)  Date Value  78/46/9629 2.2 (L)   Phosphorus (mg/dL)  Date Value  52/84/1324 2.3 (L)   Sodium (mmol/L)  Date Value  06/20/2023 139     Assessment: 83 y.o. male with history of COPD prior small cell lung cancer, DM 2, HTN.  Pharmacy is asked to follow and replace electrolytes while in CCU  Goal of Therapy:  Electrolytes WNL  Plan:  ---15 mmol iv potassium phosphate x 1 per NP ---recheck electrolytes in am  Adalberto Acton ,PharmD Clinical Pharmacist 06/20/2023 7:15 AM

## 2023-06-20 NOTE — Plan of Care (Signed)
   Problem: Fluid Volume: Goal: Ability to maintain a balanced intake and output will improve Outcome: Progressing   Problem: Metabolic: Goal: Ability to maintain appropriate glucose levels will improve Outcome: Progressing   Problem: Nutritional: Goal: Maintenance of adequate nutrition will improve Outcome: Progressing   Problem: Skin Integrity: Goal: Risk for impaired skin integrity will decrease Outcome: Progressing

## 2023-06-20 NOTE — Plan of Care (Signed)
  Problem: Fluid Volume: Goal: Ability to maintain a balanced intake and output will improve Outcome: Progressing   Problem: Metabolic: Goal: Ability to maintain appropriate glucose levels will improve Outcome: Progressing   Problem: Skin Integrity: Goal: Risk for impaired skin integrity will decrease Outcome: Progressing   Problem: Elimination: Goal: Will not experience complications related to urinary retention Outcome: Progressing   Problem: Safety: Goal: Ability to remain free from injury will improve Outcome: Progressing   Problem: Skin Integrity: Goal: Risk for impaired skin integrity will decrease Outcome: Progressing

## 2023-06-20 NOTE — Progress Notes (Signed)
 PHARMACY - ANTICOAGULATION CONSULT NOTE  Pharmacy Consult for Heparin  Indication: chest pain/ACS  Allergies  Allergen Reactions   Penicillin G Anaphylaxis    Other reaction(s): Other (See Comments) Couldn't breath among other things Has patient had a PCN reaction causing immediate rash, facial/tongue/throat swelling, SOB or lightheadedness with hypotension: Yes Has patient had a PCN reaction causing severe rash involving mucus membranes or skin necrosis: No Has patient had a PCN reaction that required hospitalization: Yes Has patient had a PCN reaction occurring within the last 10 years: No If all of the above answers are "NO", then may proceed with Ce   Shellfish-Derived Products Anaphylaxis   Statins     Other reaction(s): Other (See Comments) Muscle spasms - can't walk - couldn't turn over in bed   Erythromycin Itching    All mycins   Tamsulosin     Other reaction(s): Dizziness   Tuberculin Ppd     Other reaction(s): Other (See Comments) False test    Iodinated Contrast Media Hives    Patient Measurements: Height: 6\' 3"  (190.5 cm) Weight: 87.7 kg (193 lb 5.5 oz) IBW/kg (Calculated) : 84.5 HEPARIN DW (KG): 95.3  Vital Signs: Temp: 98.8 F (37.1 C) (04/13 0300) Temp Source: Bladder (04/13 0300) BP: 124/63 (04/13 0300) Pulse Rate: 82 (04/13 0300)  Labs: Recent Labs    06/18/23 1717 06/18/23 2218 06/18/23 2341 06/19/23 0452 06/19/23 0639 06/19/23 0837 06/19/23 2009 06/20/23 0248  HGB 12.8*  --   --  11.9*  --   --   --  10.6*  HCT 40.1  --   --  36.3*  --   --   --  32.5*  PLT 426*  --   --  398  --   --   --  320  LABPROT  --  15.3*  --   --   --   --   --   --   INR  --  1.2  --   --   --   --   --   --   HEPARINUNFRC  --   --   --   --   --  1.09* 0.76* 0.55  CREATININE 1.92*  --   --  1.95*  --   --   --  1.84*  TROPONINIHS 96* 4,173* 5,798* 6,129* 6,254*  --   --   --     Estimated Creatinine Clearance: 36.4 mL/min (A) (by C-G formula based on SCr  of 1.84 mg/dL (H)).   Medical History: Past Medical History:  Diagnosis Date   Cancer (HCC)    COPD (chronic obstructive pulmonary disease) (HCC)    Diabetes mellitus without complication (HCC)    GERD without esophagitis    Hepatitis C virus    history of hep C treated with Harvoni   Hypercholesterolemia    Hypertension    Seasonal allergies     Medications:  Medications Prior to Admission  Medication Sig Dispense Refill Last Dose/Taking   ascorbic acid (VITAMIN C) 500 MG tablet Take 500 mg by mouth daily.   Past Week   aspirin 81 MG chewable tablet Chew 4 tablets (324 mg total) by mouth daily. (Patient taking differently: Chew 81 mg by mouth daily.)   Past Week   cholecalciferol (VITAMIN D3) 25 MCG (1000 UNIT) tablet Take 1,000 Units by mouth daily.   Past Week   citalopram (CELEXA) 20 MG tablet Take 20 mg by mouth daily.   Past Week  cyanocobalamin (VITAMIN B12) 500 MCG tablet Take 500 mcg by mouth daily.   Past Week   ezetimibe (ZETIA) 10 MG tablet Take 10 mg by mouth daily.   Past Week   Fluticasone-Umeclidin-Vilant (TRELEGY ELLIPTA) 100-62.5-25 MCG/ACT AEPB Inhale 1 puff into the lungs daily. 14 each 0 Past Week   Garlic 2 MG CAPS Take by mouth.   Past Week   glimepiride (AMARYL) 4 MG tablet Take 1 tablet (4 mg total) by mouth daily with breakfast. 30 tablet 0 Past Week   insulin glargine (LANTUS) 100 UNIT/ML injection Inject 10-12 Units into the skin daily.   Past Week   Omega-3 Fatty Acids (OMEGA-3 FISH OIL PO) Take by mouth.   Past Week   pantoprazole (PROTONIX) 40 MG tablet Take 1 tablet (40 mg total) by mouth daily. 30 tablet 0 Past Week   zinc sulfate, 50mg  elemental zinc, 220 (50 Zn) MG capsule Take 220 mg by mouth daily.   Past Week   Apple Cider Vinegar 188 MG CAPS Take by mouth.   Unknown   blood glucose meter kit and supplies KIT Dispense based on patient and insurance preference. Use up to four times daily as directed. ICD: E11.9 1 each 0 Unknown   colchicine 0.6  MG tablet Take by mouth.   Unknown   Fluticasone-Umeclidin-Vilant (TRELEGY ELLIPTA) 100-62.5-25 MCG/ACT AEPB Inhale 1 puff into the lungs daily. 3 each 3 Unknown   protein supplement shake (PREMIER PROTEIN) LIQD Take 325 mLs (11 oz total) by mouth 4 (four) times daily.  0 Unknown    Assessment:  83 y.o. male with history of COPD prior small cell lung cancer, DM 2, HTN.  Pharmacy consulted to dose heparin , admitted with ACS/NSTEMI.  Pt was on heparin 5000 units SQ Q8H, last dose on 4/11 @ 2214.  Date Time HL Rate/Comment 4/12 0837 1.09 SUPRAtherapeutic 4/12 2009 0.76 SUPRAtherapeutic 4/13     0248    0.55    Therapeutic X 1   Goal of Therapy:  Heparin level 0.3-0.7 units/ml Monitor platelets by anticoagulation protocol: Yes   Plan: 4/13:  HL @ 0248 = 0.55, therapeutic X 1 - Will continue pt on current rate and recheck HL in 8 hrs on 4/13 @ 1100. Continue to monitor H&H and platelets  Thank you for involving pharmacy in this patient's care.   Ananias Balls, PharmD Clinical Pharmacist 06/20/2023 4:00 AM

## 2023-06-20 NOTE — Progress Notes (Addendum)
 SUBJECTIVE: remains intubated   Vitals:   06/20/23 1000 06/20/23 1015 06/20/23 1030 06/20/23 1119  BP: (!) 110/59 113/65 106/61   Pulse: 69 (!) 55 (!) 48   Resp: 18 18 18    Temp: 98.8 F (37.1 C) 98.8 F (37.1 C) 98.8 F (37.1 C)   TempSrc:      SpO2: 96% 97% 97% 96%  Weight:      Height:        Intake/Output Summary (Last 24 hours) at 06/20/2023 1221 Last data filed at 06/20/2023 1000 Gross per 24 hour  Intake 3746.6 ml  Output 1250 ml  Net 2496.6 ml    LABS: Basic Metabolic Panel: Recent Labs    06/19/23 0452 06/20/23 0248  NA 141 139  K 4.2 3.7  CL 107 106  CO2 21* 23  GLUCOSE 278* 259*  BUN 35* 35*  CREATININE 1.95* 1.84*  CALCIUM 8.4* 8.2*  MG 1.6* 2.2  PHOS 3.2 2.3*   Liver Function Tests: Recent Labs    06/18/23 1717 06/20/23 0248  AST 20  --   ALT 10  --   ALKPHOS 62  --   BILITOT 0.6  --   PROT 7.8  --   ALBUMIN 3.1* 2.2*   No results for input(s): "LIPASE", "AMYLASE" in the last 72 hours. CBC: Recent Labs    06/18/23 1717 06/19/23 0452 06/20/23 0248  WBC 19.2* 21.4* 16.8*  NEUTROABS 15.0*  --   --   HGB 12.8* 11.9* 10.6*  HCT 40.1 36.3* 32.5*  MCV 97.3 96.3 95.0  PLT 426* 398 320   Cardiac Enzymes: No results for input(s): "CKTOTAL", "CKMB", "CKMBINDEX", "TROPONINI" in the last 72 hours. BNP: Invalid input(s): "POCBNP" D-Dimer: No results for input(s): "DDIMER" in the last 72 hours. Hemoglobin A1C: No results for input(s): "HGBA1C" in the last 72 hours. Fasting Lipid Panel: No results for input(s): "CHOL", "HDL", "LDLCALC", "TRIG", "CHOLHDL", "LDLDIRECT" in the last 72 hours. Thyroid Function Tests: No results for input(s): "TSH", "T4TOTAL", "T3FREE", "THYROIDAB" in the last 72 hours.  Invalid input(s): "FREET3" Anemia Panel: No results for input(s): "VITAMINB12", "FOLATE", "FERRITIN", "TIBC", "IRON", "RETICCTPCT" in the last 72 hours.   PHYSICAL EXAM General: Well developed, well nourished, in no acute distress HEENT:   Normocephalic and atramatic Neck:  No JVD.  Lungs: Clear bilaterally to auscultation and percussion. Heart: HRRR . Normal S1 and S2 without gallops or murmurs.  Abdomen: Bowel sounds are positive, abdomen soft and non-tender  Msk:  Back normal, normal gait. Normal strength and tone for age. Extremities: No clubbing, cyanosis or edema.   Neuro: Alert and oriented X 3. Psych:  Good affect, responds appropriately  TELEMETRY: NSR  ASSESSMENT AND PLAN:    ICD-10-CM   1. Acute hypoxic respiratory failure (HCC)  J96.01     2. Multifocal pneumonia  J18.9     3. Sepsis, due to unspecified organism, unspecified whether acute organ dysfunction present (HCC)  A41.9       Principal Problem:   Acute hypoxic respiratory failure (HCC)   #1 Non-STEMI with peak troponin 6252 with nonspecific ST-T changes on EKG and respiratory failure.  Agree with continuing aspirin and IV heparin another 24 hours but will not be able to do any invasive procedure until patient is extubated. #2 respiratory failure sepsis/pneumonia, patient has respiratory failure requiring intubation with chest x-ray revealing left midlung and left lower lung multifocal pneumonia.  Patient also has elevated lactic acid and may be septic.  Patient currently on antibiotics  and pressors.  However blood pressure is normal at this ti Debborah Fairly, MD, [facility] 06/20/2023 12:21 PM

## 2023-06-20 NOTE — Progress Notes (Signed)
 Patient on ventilator support and  pressor support, NSR, afebrile.  Patient failed SBT d/t increased heart rate  (150s) and copious secretions. Family at bedside. Spouse wishes to wait another day. NP at bedside.   Urine red, NP made aware.

## 2023-06-21 DIAGNOSIS — J189 Pneumonia, unspecified organism: Secondary | ICD-10-CM | POA: Diagnosis not present

## 2023-06-21 DIAGNOSIS — R6521 Severe sepsis with septic shock: Secondary | ICD-10-CM | POA: Diagnosis not present

## 2023-06-21 DIAGNOSIS — A419 Sepsis, unspecified organism: Secondary | ICD-10-CM | POA: Diagnosis not present

## 2023-06-21 DIAGNOSIS — J9601 Acute respiratory failure with hypoxia: Secondary | ICD-10-CM | POA: Diagnosis not present

## 2023-06-21 LAB — CBC
HCT: 29.9 % — ABNORMAL LOW (ref 39.0–52.0)
Hemoglobin: 9.9 g/dL — ABNORMAL LOW (ref 13.0–17.0)
MCH: 31.2 pg (ref 26.0–34.0)
MCHC: 33.1 g/dL (ref 30.0–36.0)
MCV: 94.3 fL (ref 80.0–100.0)
Platelets: 306 10*3/uL (ref 150–400)
RBC: 3.17 MIL/uL — ABNORMAL LOW (ref 4.22–5.81)
RDW: 15.5 % (ref 11.5–15.5)
WBC: 13.4 10*3/uL — ABNORMAL HIGH (ref 4.0–10.5)
nRBC: 0.1 % (ref 0.0–0.2)

## 2023-06-21 LAB — RENAL FUNCTION PANEL
Albumin: 2 g/dL — ABNORMAL LOW (ref 3.5–5.0)
Anion gap: 7 (ref 5–15)
BUN: 31 mg/dL — ABNORMAL HIGH (ref 8–23)
CO2: 23 mmol/L (ref 22–32)
Calcium: 7.8 mg/dL — ABNORMAL LOW (ref 8.9–10.3)
Chloride: 106 mmol/L (ref 98–111)
Creatinine, Ser: 1.61 mg/dL — ABNORMAL HIGH (ref 0.61–1.24)
GFR, Estimated: 42 mL/min — ABNORMAL LOW (ref >60)
Glucose, Bld: 247 mg/dL — ABNORMAL HIGH (ref 70–99)
Phosphorus: 1.8 mg/dL — ABNORMAL LOW (ref 2.5–4.6)
Potassium: 3.7 mmol/L (ref 3.5–5.1)
Sodium: 136 mmol/L (ref 135–145)

## 2023-06-21 LAB — CULTURE, BLOOD (ROUTINE X 2)

## 2023-06-21 LAB — GLUCOSE, CAPILLARY
Glucose-Capillary: 225 mg/dL — ABNORMAL HIGH (ref 70–99)
Glucose-Capillary: 184 mg/dL — ABNORMAL HIGH (ref 70–99)
Glucose-Capillary: 197 mg/dL — ABNORMAL HIGH (ref 70–99)
Glucose-Capillary: 210 mg/dL — ABNORMAL HIGH (ref 70–99)
Glucose-Capillary: 223 mg/dL — ABNORMAL HIGH (ref 70–99)
Glucose-Capillary: 240 mg/dL — ABNORMAL HIGH (ref 70–99)
Glucose-Capillary: 261 mg/dL — ABNORMAL HIGH (ref 70–99)
Glucose-Capillary: 262 mg/dL — ABNORMAL HIGH (ref 70–99)
Glucose-Capillary: 260 mg/dL — ABNORMAL HIGH (ref 70–99)
Glucose-Capillary: 118 mg/dL — ABNORMAL HIGH (ref 70–99)
Glucose-Capillary: 161 mg/dL — ABNORMAL HIGH (ref 70–99)

## 2023-06-21 LAB — HEMOGLOBIN A1C
Hgb A1c MFr Bld: 7.2 % — ABNORMAL HIGH (ref 4.8–5.6)
Mean Plasma Glucose: 160 mg/dL

## 2023-06-21 LAB — HEPARIN LEVEL (UNFRACTIONATED)
Heparin Unfractionated: 0.24 [IU]/mL — ABNORMAL LOW (ref 0.30–0.70)
Heparin Unfractionated: 0.45 [IU]/mL (ref 0.30–0.70)
Heparin Unfractionated: 0.39 [IU]/mL (ref 0.30–0.70)

## 2023-06-21 LAB — MAGNESIUM: Magnesium: 2 mg/dL (ref 1.7–2.4)

## 2023-06-21 MED ORDER — DEXMEDETOMIDINE HCL IN NACL 400 MCG/100ML IV SOLN
INTRAVENOUS | Status: AC
Start: 2023-06-21 — End: 2023-06-21
  Filled 2023-06-21: qty 100

## 2023-06-21 MED ORDER — HYDROCORTISONE SOD SUC (PF) 100 MG IJ SOLR: 100.0000 mg | Freq: Two times a day (BID) | INTRAMUSCULAR | Status: DC

## 2023-06-21 MED ORDER — THIAMINE HCL 100 MG PO TABS
100.0000 mg | ORAL_TABLET | Freq: Every day | ORAL | Status: DC
  Administered 2023-06-21 – 2023-06-23 (×3): 100 mg
  Filled 2023-06-21 (×5): qty 1

## 2023-06-21 MED ORDER — INSULIN ASPART 100 UNIT/ML IJ SOLN
3.0000 [IU] | INTRAMUSCULAR | Status: DC
  Administered 2023-06-21 – 2023-06-23 (×11): 3 [IU] via SUBCUTANEOUS
  Filled 2023-06-21 (×11): qty 1

## 2023-06-21 MED ORDER — FAMOTIDINE 20 MG PO TABS
20.0000 mg | ORAL_TABLET | Freq: Two times a day (BID) | ORAL | Status: DC
  Administered 2023-06-21 – 2023-06-23 (×5): 20 mg
  Filled 2023-06-21 (×5): qty 1

## 2023-06-21 MED ORDER — HEPARIN BOLUS VIA INFUSION
1400.0000 [IU] | Freq: Once | INTRAVENOUS | Status: AC
  Administered 2023-06-21: 1400 [IU] via INTRAVENOUS
  Filled 2023-06-21: qty 1400

## 2023-06-21 MED ORDER — PROSOURCE TF20 ENFIT COMPATIBL EN LIQD
60.0000 mL | Freq: Every day | ENTERAL | Status: DC
  Administered 2023-06-22 – 2023-06-23 (×2): 60 mL
  Filled 2023-06-21: qty 60

## 2023-06-21 MED ORDER — POTASSIUM & SODIUM PHOSPHATES 280-160-250 MG PO PACK
1.0000 | PACK | Freq: Three times a day (TID) | ORAL | Status: AC
  Administered 2023-06-21 (×3): 1
  Filled 2023-06-21 (×3): qty 1

## 2023-06-21 MED ORDER — ASPIRIN 81 MG PO CHEW
81.0000 mg | CHEWABLE_TABLET | Freq: Every day | ORAL | Status: DC
  Administered 2023-06-21 – 2023-07-04 (×10): 81 mg
  Filled 2023-06-21 (×11): qty 1

## 2023-06-21 MED ORDER — DEXMEDETOMIDINE HCL IN NACL 400 MCG/100ML IV SOLN
0.0000 ug/kg/h | INTRAVENOUS | Status: DC
  Administered 2023-06-21: 0.5 ug/kg/h via INTRAVENOUS
  Administered 2023-06-21: 0.4 ug/kg/h via INTRAVENOUS
  Administered 2023-06-22: 0.7 ug/kg/h via INTRAVENOUS
  Administered 2023-06-22: 0.6 ug/kg/h via INTRAVENOUS
  Administered 2023-06-22: 0.5 ug/kg/h via INTRAVENOUS
  Administered 2023-06-23: 0.6 ug/kg/h via INTRAVENOUS
  Filled 2023-06-21 (×6): qty 100

## 2023-06-21 MED ORDER — INSULIN GLARGINE-YFGN 100 UNIT/ML ~~LOC~~ SOLN
10.0000 [IU] | Freq: Every day | SUBCUTANEOUS | Status: DC
  Administered 2023-06-21 – 2023-06-23 (×3): 10 [IU] via SUBCUTANEOUS
  Filled 2023-06-21 (×4): qty 0.1

## 2023-06-21 NOTE — Progress Notes (Signed)
 Failed SBT d/t increased WOB, tachypnic, tachycardic. Precedex started for patient. Fentanyl resumed at 50%. Wife at bedside throughout. Questions answered by NP Maryagnes Small at bedside.

## 2023-06-21 NOTE — Progress Notes (Signed)
 NAME:  Rolla Kedzierski, MRN:  [id number], DOB:  1940/09/19, LOS: 3 ADMISSION DATE:  06/18/2023  History of Present Illness:  Mr. Devita is an 83 year old African-American male with a history of type 2 diabetes, hypertension, small cell lung cancer, and COPD who presented to the ED via EMS with complaints of respiratory distress.  History is obtained from EMS records as well as ED records as patient is currently intubated and sedated and there is no family at the bedside.  Per ED records, EMS was called for respiratory distress.  Upon EMS arrival, patient's oxygen saturation was 60%.  Bag mask ventilation was started and patient was transported to the ED after an unsuccessful intubation attempt in the field.  Upon ED arrival, patient had copious amounts of secretions in his oral cavity.  Initial ED workup revealed multifocal pneumonia with sepsis.  Patient was placed on BiPAP however he became combative and started coughing profusely and also had large amounts of secretions that filled up the BiPAP mask.  His oxygen saturation dropped and he was emergently intubated.  PCCM was consulted for further management. ED course: His initial venous gas in the ED showed a pH of 7.25, PCO2 of 49, and PaO2 of 54.  Chemistry showed CO2 level of 19, glucose level of 360 mg/dL, BUN of 35, and a creatinine of 1.90 which is close to his baseline.  CBC showed a WBC of 19.2 platelets of 426, neutrophils of 15.0.  His respiratory panel was negative for influenza and COVID as well as a RSV.  His chest x-ray showed multifocal pneumonia.  He was started on broad-spectrum antibiotics and fluid resuscitation was also initiated.  Postintubation, patient became hypotensive requiring pressors.  He was started on peripheral norepinephrine. Upon arrival in the ICU, patient was intubated and sedated.  His initial troponin was 96 however the second troponin peaked to 4173.  Lactic acid was 4.7 and peaked at 5.1.  He was started on a heparin  infusion and cardiology was consulted.  A 2D echo was ordered.  He was also given multiple fluid boluses.  Significant Hospital Events: Including procedures, antibiotic start and stop dates in addition to other pertinent events   04/11: Admitted with multifocal pneumonia, NSTEMI, sepsis and septic shock requiring mechanical intubation  04/13: Pt remains mechanically intubated on minimal vent settings.  Will perform WUA and SBT once spouse arrives at bedside    Interim History / Subjective:  Remains intubated and sedated. Follows commands when on SAT. Failed SBT due to Desaturations and profound secretions.   Objective   Blood pressure 105/70, pulse 94, temperature 99 F (37.2 C), resp. rate 18, height 6\' 3"  (1.905 m), weight 90.2 kg, SpO2 92%.    Vent Mode: PRVC FiO2 (%):  [30 %] 30 % Set Rate:  [18 bmp] 18 bmp Vt Set:  [550 mL] 550 mL PEEP:  [5 cmH20] 5 cmH20 Plateau Pressure:  [17 cmH20-24 cmH20] 17 cmH20   Intake/Output Summary (Last 24 hours) at 06/21/2023 1434 Last data filed at 06/21/2023 1401 Gross per 24 hour  Intake 3211.47 ml  Output 630 ml  Net 2581.47 ml   Filed Weights   06/19/23 0515 06/20/23 0318 06/21/23 0448  Weight: 87.7 kg 87.8 kg 90.2 kg    Examination: General: Intubated and sedated HENT: Supple neck, reactive pupils  Lungs: Coarse bilateral breath entry Cardiovascular: Normal S1, Normal S2, RRR  Abdomen: Soft, non tender, non distended, +BS  Extremities: Warm and well perfused no edema.  Labs and imaging were reviewed.    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  # Septic shock secondary  to.. # Community-acquired pneumonia complicated by acute hypoxic respiratory failure requiring intubation and mechanical ventilation on 04/11. # History of small cell lung cancer status post chemoradiation in 2018 in remission now with suspicion of recurrence. # NSTEMI likely demand #AKI with Cr. 1.95 mg/dl from a baseline of 1.6mg /dl secondary to septic ATN.   Neuro: Precedex and fentanyl for analgesia sedation RASS 0 to -1 CPOT less than 2. Pulmonary: Cefepime and Azithromycin for a total of 7 and 3 days resp. Duonebs schedule and budesonide inhaled bid. Sputum Culture.  CVS: NE for MAP > 65. Heparin drip, Aspirin 81mg  daily. Appreciated cardiology's input.  Renal: Monitor UOP.  Heme: heparin drip for 72 hours.  Endo: PCO 140-180   Best Practice (right click and "Reselect all SmartList Selections" daily)   Diet/type: tubefeeds DVT prophylaxis systemic heparin Pressure ulcer(s): N/A GI prophylaxis: PPI Lines: N/A Foley:  N/A Code Status:  full code  Last date of multidisciplinary goals of care discussion [06/21/2023]  Critical care time: 60 minutes.     Annitta Kindler, MD [facility] 06/21/2023 2:39 PM

## 2023-06-21 NOTE — Progress Notes (Signed)
 PHARMACY - ANTICOAGULATION CONSULT NOTE  Pharmacy Consult for IV Heparin  Indication: chest pain/ACS  Patient Measurements: Height: 6\' 3"  (190.5 cm) Weight: 87.7 kg (193 lb 5.5 oz) IBW/kg (Calculated) : 84.5 HEPARIN DW (KG): 95.3  Labs: Recent Labs    06/18/23 2218 06/18/23 2341 06/19/23 0452 06/19/23 0639 06/19/23 0837 06/20/23 0242 06/20/23 0248 06/20/23 1113 06/21/23 0450 06/21/23 1414  HGB  --   --  11.9*  --   --   --  10.6*  --  9.9*  --   HCT  --   --  36.3*  --   --   --  32.5*  --  29.9*  --   PLT  --   --  398  --   --   --  320  --  306  --   LABPROT 15.3*  --   --   --   --   --   --   --   --   --   INR 1.2  --   --   --   --   --   --   --   --   --   HEPARINUNFRC  --   --   --   --    < >  --  0.55 0.45 0.24* 0.45  CREATININE  --   --  1.95*  --   --   --  1.84*  --  1.61*  --   TROPONINIHS 4,173*   < > 6,129* 6,254*  --  2,700*  --   --   --   --    < > = values in this interval not displayed.    Estimated Creatinine Clearance: 41.6 mL/min (A) (by C-G formula based on SCr of 1.61 mg/dL (H)).   Medical History: Past Medical History:  Diagnosis Date   Cancer (HCC)    COPD (chronic obstructive pulmonary disease) (HCC)    Diabetes mellitus without complication (HCC)    GERD without esophagitis    Hepatitis C virus    history of hep C treated with Harvoni   Hypercholesterolemia    Hypertension    Seasonal allergies    Assessment:  83 y.o. male with history of COPD prior small cell lung cancer, DM 2, HTN.  Pharmacy consulted to dose heparin , admitted with ACS/NSTEMI.  Pt was on heparin 5000 units SQ Q8H, last dose on 4/11 @ 2214.  Date Time HL Rate/Comment 4/12 0837 1.09 SUPRAtherapeutic 4/12 2009 0.76 SUPRAtherapeutic 4/13     0248    0.55    Therapeutic x 1  4/14     0450    0.24    SUBtherapeutic 4/14 1414 0.45 Therapeutic x 1  Goal of Therapy:  Heparin level 0.3-0.7 units/ml Monitor platelets by anticoagulation protocol: Yes   Plan:   --Heparin level is therapeutic x 1 --Continue heparin infusion at 1100 units/hr --Re-check confirmatory HL in 8 hours --Daily CBC per protocol while on IV heparin  Thank you for involving pharmacy in this patient's care.   Page Boast 06/21/2023 2:52 PM

## 2023-06-21 NOTE — Progress Notes (Signed)
 [facility] CLINIC CARDIOLOGY PROGRESS NOTE   Patient ID: Edwin Jones MRN: [id number] DOB/AGE: [birth year] 83 y.o.  Admit date: 06/18/2023 Referring Physician Dr. Curley Double Primary Physician Willene Harper Georgeann Kindred, MD  Primary Cardiologist None Reason for Consultation Elevated troponins  HPI: Edwin Jones is a 83 y.o. male with a past medical history of hypertension, type 2 diabetes mellitus, COPD, small cell lung cancer who presented to the ED on 06/18/2023 for respiratory distress. Patient was found to have elevated troponins, NSTEMI. Cardiology was consulted for further evaluation.   Interval History:  -Patient remains intubated, on propofol and levophed. HR is stable and BP borderline. -Cr is improving 1.84 > 1.61. Electrolytes are stable -Hgb 9.9, continue to monitor. -Per RN, plan for SAT/SBT today.  Review of systems complete and found to be negative unless listed above    Vitals:   06/21/23 0807 06/21/23 0900 06/21/23 0917 06/21/23 1141  BP:  (!) 99/57 (!) 105/51   Pulse:  72 83   Resp:  18 18   Temp:  99 F (37.2 C) 99 F (37.2 C)   TempSrc:      SpO2: 98% 97% 96% 96%  Weight:      Height:         Intake/Output Summary (Last 24 hours) at 06/21/2023 1144 Last data filed at 06/21/2023 0801 Gross per 24 hour  Intake 3209.13 ml  Output 926 ml  Net 2283.13 ml     PHYSICAL EXAM General: Chronically ill appearing elderly male, well nourished, in no acute distress. HEENT: Normocephalic and atraumatic. Neck: No JVD.  Lungs: Intubated. Mechanical breath sounds. Heart: HRRR. Normal S1 and S2 without gallops or murmurs. Radial & DP pulses 2+ bilaterally. Abdomen: Non-distended appearing.  Msk: Normal strength and tone for age. Extremities: No clubbing, cyanosis or edema.   Neuro: Alert and oriented X 3. Psych: Mood appropriate, affect congruent.    LABS: Basic Metabolic Panel: Recent Labs    06/20/23 0248 06/21/23 0450  NA 139 136  K 3.7 3.7  CL 106 106  CO2 23  23  GLUCOSE 259* 247*  BUN 35* 31*  CREATININE 1.84* 1.61*  CALCIUM 8.2* 7.8*  MG 2.2 2.0  PHOS 2.3* 1.8*   Liver Function Tests: Recent Labs    06/18/23 1717 06/20/23 0248 06/21/23 0450  AST 20  --   --   ALT 10  --   --   ALKPHOS 62  --   --   BILITOT 0.6  --   --   PROT 7.8  --   --   ALBUMIN 3.1* 2.2* 2.0*   No results for input(s): "LIPASE", "AMYLASE" in the last 72 hours. CBC: Recent Labs    06/18/23 1717 06/19/23 0452 06/20/23 0248 06/21/23 0450  WBC 19.2*   < > 16.8* 13.4*  NEUTROABS 15.0*  --   --   --   HGB 12.8*   < > 10.6* 9.9*  HCT 40.1   < > 32.5* 29.9*  MCV 97.3   < > 95.0 94.3  PLT 426*   < > 320 306   < > = values in this interval not displayed.   Cardiac Enzymes: Recent Labs    06/19/23 0452 06/19/23 0639 06/20/23 0242  TROPONINIHS 6,129* 6,254* 2,700*   BNP: Recent Labs    06/18/23 1717  BNP 87.6   D-Dimer: No results for input(s): "DDIMER" in the last 72 hours. Hemoglobin A1C: Recent Labs    06/18/23 2218  HGBA1C 7.2*   Fasting  Lipid Panel: No results for input(s): "CHOL", "HDL", "LDLCALC", "TRIG", "CHOLHDL", "LDLDIRECT" in the last 72 hours. Thyroid Function Tests: No results for input(s): "TSH", "T4TOTAL", "T3FREE", "THYROIDAB" in the last 72 hours.  Invalid input(s): "FREET3" Anemia Panel: No results for input(s): "VITAMINB12", "FOLATE", "FERRITIN", "TIBC", "IRON", "RETICCTPCT" in the last 72 hours.  No results found.   ECHO 06/19/2023 1. Left ventricular ejection fraction, by estimation, is 50 to 55%. The left ventricle has low normal function. The left ventricle demonstrates regional wall motion abnormalities (see scoring diagram/findings for  description). There is mild concentric left ventricular hypertrophy. Left ventricular diastolic parameters are consistent with Grade I diastolic dysfunction (impaired relaxation).   2. Right ventricular systolic function is normal. The right ventricular size is normal.   3. Left  atrial size was mild to moderately dilated.   4. Right atrial size was mild to moderately dilated.   5. The mitral valve is normal in structure. Mild mitral valve regurgitation. No evidence of mitral stenosis.   6. The aortic valve is normal in structure. Aortic valve regurgitation is trivial. No aortic stenosis is present.   7. The inferior vena cava is normal in size with greater than 50% respiratory variability, suggesting right atrial pressure of 3 mmHg.    TELEMETRY reviewed by me 06/21/23: sinus rhythm, rate 70s  EKG reviewed by me 06/21/23: sinus rhythm, rate 82 bpm  DATA reviewed by me 06/21/23: last 24h vitals tele labs imaging I/O, hospitalist and critical care progress notes  Principal Problem:   Acute hypoxic respiratory failure (HCC)    ASSESSMENT AND PLAN: Edwin Jones is a 83 y.o. male with a past medical history of chronic HFpEF, hypertension, type 2 diabetes mellitus, COPD, small cell lung cancer who presented to the ED on 06/18/2023 for respiratory distress. Patient was found to have elevated troponins. Cardiology was consulted for further evaluation.   # NSTEMI, Type I # Acute hypoxic respiratory failure # Septic shock, Pneumonia # COPD exacerbation  # Chronic HFpEF Patient presented to the ED via EMS with respiratory distress. Patient was placed on BiPAP but became combative and his O2 desaturated and needed emergent intubation. CXR revealed left mid/lower pneumonia. IV antibiotics and fluids were started. Patient became hypotensive requiring Levophed. Trops elevated and trending 96 > 4,173 > 5,798 > 6,129 > 2,700. BNP normal 87. EKG in ED with sinus rhythm rate 82 bpm with no significant ST changes. Echo this admission showed EF 50-55%, regional wall motion abnormalities of LV, grade I diastolic dysfunction. Patient remains intubated, on propofol, and levophed. HR is stable and BP borderline. -Continue heparin infusion for NSTEMI. \ -Aspirin 81 mg daily ordered per  tube.  Patient has documented statin allergy. -No plan for further cardiac diagnostics at this time. Reassess when patient is extubated and hemodynamically stable. -Defer GDMT at this time. Patient remains intubated and on pressor support. -Pneumonia, acute respiratory failure managed per primary and critical care teams.   This patient's case was discussed and created with Dr. Beau Bound and he is in agreement.  Signed:  Hamp Levine, PA-C  06/21/2023, 11:44 AM [facility] Cardiology

## 2023-06-21 NOTE — Telephone Encounter (Signed)
 Patient has Medicare A & B along with [employer] Ins Prior Auth Not Required Refer # [id number] valid May 29, 2023 to December 10, 2023 for the codes 47829, [phone]

## 2023-06-21 NOTE — Progress Notes (Signed)
 PHARMACY - ANTICOAGULATION CONSULT NOTE  Pharmacy Consult for Heparin  Indication: chest pain/ACS  Allergies  Allergen Reactions   Penicillin G Anaphylaxis    Other reaction(s): Other (See Comments) Couldn't breath among other things Has patient had a PCN reaction causing immediate rash, facial/tongue/throat swelling, SOB or lightheadedness with hypotension: Yes Has patient had a PCN reaction causing severe rash involving mucus membranes or skin necrosis: No Has patient had a PCN reaction that required hospitalization: Yes Has patient had a PCN reaction occurring within the last 10 years: No If all of the above answers are "NO", then may proceed with Ce   Shellfish-Derived Products Anaphylaxis   Statins     Other reaction(s): Other (See Comments) Muscle spasms - can't walk - couldn't turn over in bed   Erythromycin Itching    All mycins   Tamsulosin     Other reaction(s): Dizziness   Tuberculin Ppd     Other reaction(s): Other (See Comments) False test    Iodinated Contrast Media Hives    Patient Measurements: Height: 6\' 3"  (190.5 cm) Weight: 87.7 kg (193 lb 5.5 oz) IBW/kg (Calculated) : 84.5 HEPARIN DW (KG): 95.3  Vital Signs: Temp: 99.1 F (37.3 C) (04/14 0530) Temp Source: Bladder (04/14 0400) BP: 103/51 (04/14 0530) Pulse Rate: 72 (04/14 0530)  Labs: Recent Labs    06/18/23 2218 06/18/23 2341 06/19/23 0452 06/19/23 0639 06/19/23 0837 06/20/23 0242 06/20/23 0248 06/20/23 1113 06/21/23 0450  HGB  --   --  11.9*  --   --   --  10.6*  --  9.9*  HCT  --   --  36.3*  --   --   --  32.5*  --  29.9*  PLT  --   --  398  --   --   --  320  --  306  LABPROT 15.3*  --   --   --   --   --   --   --   --   INR 1.2  --   --   --   --   --   --   --   --   HEPARINUNFRC  --   --   --   --    < >  --  0.55 0.45 0.24*  CREATININE  --   --  1.95*  --   --   --  1.84*  --  1.61*  TROPONINIHS 4,173*   < > 6,129* 6,254*  --  2,700*  --   --   --    < > = values in this  interval not displayed.    Estimated Creatinine Clearance: 41.6 mL/min (A) (by C-G formula based on SCr of 1.61 mg/dL (H)).   Medical History: Past Medical History:  Diagnosis Date   Cancer (HCC)    COPD (chronic obstructive pulmonary disease) (HCC)    Diabetes mellitus without complication (HCC)    GERD without esophagitis    Hepatitis C virus    history of hep C treated with Harvoni   Hypercholesterolemia    Hypertension    Seasonal allergies     Medications:  Medications Prior to Admission  Medication Sig Dispense Refill Last Dose/Taking   ascorbic acid (VITAMIN C) 500 MG tablet Take 500 mg by mouth daily.   Past Week   aspirin 81 MG chewable tablet Chew 4 tablets (324 mg total) by mouth daily. (Patient taking differently: Chew 81 mg by mouth daily.)  Past Week   cholecalciferol (VITAMIN D3) 25 MCG (1000 UNIT) tablet Take 1,000 Units by mouth daily.   Past Week   citalopram (CELEXA) 20 MG tablet Take 20 mg by mouth daily.   Past Week   cyanocobalamin (VITAMIN B12) 500 MCG tablet Take 500 mcg by mouth daily.   Past Week   ezetimibe (ZETIA) 10 MG tablet Take 10 mg by mouth daily.   Past Week   Fluticasone-Umeclidin-Vilant (TRELEGY ELLIPTA) 100-62.5-25 MCG/ACT AEPB Inhale 1 puff into the lungs daily. 14 each 0 Past Week   Garlic 2 MG CAPS Take by mouth.   Past Week   glimepiride (AMARYL) 4 MG tablet Take 1 tablet (4 mg total) by mouth daily with breakfast. 30 tablet 0 Past Week   insulin glargine (LANTUS) 100 UNIT/ML injection Inject 10-12 Units into the skin daily.   Past Week   Omega-3 Fatty Acids (OMEGA-3 FISH OIL PO) Take by mouth.   Past Week   pantoprazole (PROTONIX) 40 MG tablet Take 1 tablet (40 mg total) by mouth daily. 30 tablet 0 Past Week   zinc sulfate, 50mg  elemental zinc, 220 (50 Zn) MG capsule Take 220 mg by mouth daily.   Past Week   Apple Cider Vinegar 188 MG CAPS Take by mouth.   Unknown   blood glucose meter kit and supplies KIT Dispense based on patient and  insurance preference. Use up to four times daily as directed. ICD: E11.9 1 each 0 Unknown   colchicine 0.6 MG tablet Take by mouth.   Unknown   Fluticasone-Umeclidin-Vilant (TRELEGY ELLIPTA) 100-62.5-25 MCG/ACT AEPB Inhale 1 puff into the lungs daily. 3 each 3 Unknown   protein supplement shake (PREMIER PROTEIN) LIQD Take 325 mLs (11 oz total) by mouth 4 (four) times daily.  0 Unknown    Assessment:  83 y.o. male with history of COPD prior small cell lung cancer, DM 2, HTN.  Pharmacy consulted to dose heparin , admitted with ACS/NSTEMI.  Pt was on heparin 5000 units SQ Q8H, last dose on 4/11 @ 2214.  Date Time HL Rate/Comment 4/12 0837 1.09 SUPRAtherapeutic 4/12 2009 0.76 SUPRAtherapeutic 4/13     0248    0.55    Therapeutic X 1  4/14     0450    0.24    SUBtherapeutic   Goal of Therapy:  Heparin level 0.3-0.7 units/ml Monitor platelets by anticoagulation protocol: Yes   Plan:  4/14:  HL @ 0450 = 0.24, SUBtherapeutic - Will order heparin 1400 units IV X X 1 bolus and increase drip rate to 1100 units/hr - Will recheck HL 8 hrs after rate change  --Continue to monitor H&H and platelets  Thank you for involving pharmacy in this patient's care.   Alesandro Stueve D Clinical Pharmacist 06/21/2023 6:20 AM

## 2023-06-21 NOTE — Inpatient Diabetes Management (Addendum)
 Inpatient Diabetes Program Recommendations  AACE/ADA: New Consensus Statement on Inpatient Glycemic Control (2015)  Target Ranges:  Prepandial:   less than 140 mg/dL      Peak postprandial:   less than 180 mg/dL (1-2 hours)      Critically ill patients:  140 - 180 mg/dL    Latest Reference Range & Units 06/18/23 22:18  Hemoglobin A1C 4.8 - 5.6 % 7.2 (H)  (H): Data is abnormally high  Latest Reference Range & Units 06/20/23 00:08 06/20/23 03:43 06/20/23 08:03 06/20/23 11:30 06/20/23 15:50 06/20/23 19:50  Glucose-Capillary 70 - 99 mg/dL 161 (H) 096 (H) 045 (H) 282 (H) 221 (H) 197 (H)  (H): Data is abnormally high  Latest Reference Range & Units 06/20/23 23:39 06/21/23 04:42 06/21/23 07:15  Glucose-Capillary 70 - 99 mg/dL 409 (H) 811 (H) 914 (H)  (H): Data is abnormally high    Admit with: Pneumonia, sepsis, septic shock and respiratory failure/ NSTEMI  History: DM2  Home DM Meds: Amaryl 4 mg daily       Lantus 10-12 units daily  Current Orders: Novolog Moderate Correction Scale/ SSI (0-15 units) Q4 hours    MD- Note pt getting Tube Feeds 70cc/hr  CBGs >200  Takes Lantus at home as well  Please consider:  1. Start Lantus 5 units BID  2. Start Novolog Tube Feed Coverage: Novolog 3 units Q4 hours HOLD if tube feeds HELD for any reason    --Will follow patient during hospitalization--  Langston Pippins RN, MSN, CDCES Diabetes Coordinator Inpatient Glycemic Control Team Team Pager: [phone] (8a-5p)

## 2023-06-21 NOTE — Progress Notes (Signed)
 PHARMACY CONSULT NOTE  Pharmacy Consult for Electrolyte Monitoring and Replacement   Recent Labs: Potassium (mmol/L)  Date Value  06/21/2023 3.7   Magnesium (mg/dL)  Date Value  62/13/0865 2.0   Calcium (mg/dL)  Date Value  78/46/9629 7.8 (L)   Albumin (g/dL)  Date Value  52/84/1324 2.0 (L)   Phosphorus (mg/dL)  Date Value  40/12/2723 1.8 (L)   Sodium (mmol/L)  Date Value  06/21/2023 136   Assessment: 83 y.o. male with history of COPD prior small cell lung cancer, DM 2, HTN.  Pharmacy is asked to follow and replace electrolytes while in CCU  Goal of Therapy:  Electrolytes WNL  Plan:  --Phos 1.8, Phos-Nak 1 packet per tube 4x/day x 4 doses --Re-check electrolytes with AM labs tomorrow  Page Boast 06/21/2023 7:36 AM

## 2023-06-21 NOTE — Plan of Care (Signed)

## 2023-06-21 NOTE — Progress Notes (Addendum)
 Initial Nutrition Assessment  DOCUMENTATION CODES:   Not applicable  INTERVENTION:   Continue Vital 1.2@70ml /hr + ProSource TF 20- Give 60ml daily via tube  Free water flushes 30ml q4 hours to maintain tube patency   Regimen provides 2096kcal/day, 146g/day protein and 1545ml/day of free water.   Pt remains at high refeed risk; recommend monitor potassium, magnesium and phosphorus labs daily until stable  Thiamine 100mg  po daily via tube x 7 days  Daily weights   NUTRITION DIAGNOSIS:   Inadequate oral intake related to inability to eat as evidenced by NPO status. 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  Pt sedated and ventilated. OGT in place. Pt is tolerating tube feeds at goal rate. Pt is refeeding; electrolytes being monitored and supplemented. Per chart, pt is up ~5lbs since admission but appears to remain down ~11lbs(5%) from his UBW. No BM since 4/11.   Medications reviewed and include: aspirin, pepcid, insulin, phos-nak, azithromycin, cefepime, heparin, levophed, propofol   Labs reviewed: K 3.7 wnl, BUN 31(H), creat 1.61(H), P 1.8(L), Mg 2.0 wnl Wbc- 13.4(H), Hgb 9.9(L), Hct 29.9(L) Cbgs- 118, 260, 240, 225 x 24 hrs  AIC 7.2(H)- 4/11  Patient is currently intubated on ventilator support MV: 9.3 L/min Temp (24hrs), Avg:99.5 F (37.5 C), Min:98.2 F (36.8 C), Max:100.4 F (38 C)  Propofol: 17.15 ml/hr- provides 453kcal/day   MAP >6mmHg   UOP-   NUTRITION - FOCUSED PHYSICAL EXAM:  Flowsheet Row Most Recent Value  Orbital Region No depletion  Upper Arm Region No depletion  Thoracic and Lumbar  Region No depletion  Buccal Region No depletion  Temple Region Mild depletion  Clavicle Bone Region No depletion  Clavicle and Acromion Bone Region No depletion  Scapular Bone Region No depletion  Dorsal Hand No depletion  Patellar Region Mild depletion  Anterior Thigh Region Mild depletion  Posterior Calf Region Mild depletion  Edema (RD Assessment) Mild  Hair Reviewed  Eyes Reviewed  Mouth Reviewed  Skin Reviewed  Nails Reviewed   Diet Order:   Diet Order             Diet NPO time specified  Diet effective now                  EDUCATION NEEDS:   No education needs have been identified at this time  Skin:  Skin Assessment: Reviewed RN Assessment  Last BM:  4/11  Height:   Ht Readings from Last 1 Encounters:  06/18/23 6\' 3"  (1.905 m)    Weight:   Wt Readings from Last 1 Encounters:  06/21/23 90.2 kg    Ideal Body Weight:  89.1 kg  BMI:  Body mass index is 24.86 kg/m.  Estimated Nutritional Needs:   Kcal:  2043kcal/day  Protein:  135-150g/day  Fluid:  2.4-2.7L/day  Betsey Holiday MS, RD, LDN If unable to be reached, please send secure chat to "RD inpatient" available from 8:00a-4:00p daily

## 2023-06-21 NOTE — Telephone Encounter (Signed)
 Noted. Nothing further needed.

## 2023-06-22 ENCOUNTER — Inpatient Hospital Stay

## 2023-06-22 ENCOUNTER — Telehealth: Payer: Self-pay

## 2023-06-22 DIAGNOSIS — J189 Pneumonia, unspecified organism: Secondary | ICD-10-CM | POA: Diagnosis not present

## 2023-06-22 DIAGNOSIS — R6521 Severe sepsis with septic shock: Secondary | ICD-10-CM | POA: Diagnosis not present

## 2023-06-22 DIAGNOSIS — J9601 Acute respiratory failure with hypoxia: Secondary | ICD-10-CM | POA: Diagnosis not present

## 2023-06-22 DIAGNOSIS — A419 Sepsis, unspecified organism: Secondary | ICD-10-CM | POA: Diagnosis not present

## 2023-06-22 LAB — GLUCOSE, CAPILLARY
Glucose-Capillary: 117 mg/dL — ABNORMAL HIGH (ref 70–99)
Glucose-Capillary: 138 mg/dL — ABNORMAL HIGH (ref 70–99)
Glucose-Capillary: 152 mg/dL — ABNORMAL HIGH (ref 70–99)
Glucose-Capillary: 163 mg/dL — ABNORMAL HIGH (ref 70–99)
Glucose-Capillary: 182 mg/dL — ABNORMAL HIGH (ref 70–99)
Glucose-Capillary: 185 mg/dL — ABNORMAL HIGH (ref 70–99)
Glucose-Capillary: 224 mg/dL — ABNORMAL HIGH (ref 70–99)

## 2023-06-22 LAB — BASIC METABOLIC PANEL WITH GFR
Anion gap: 6 (ref 5–15)
BUN: 33 mg/dL — ABNORMAL HIGH (ref 8–23)
CO2: 23 mmol/L (ref 22–32)
Calcium: 8 mg/dL — ABNORMAL LOW (ref 8.9–10.3)
Chloride: 109 mmol/L (ref 98–111)
Creatinine, Ser: 1.65 mg/dL — ABNORMAL HIGH (ref 0.61–1.24)
GFR, Estimated: 41 mL/min — ABNORMAL LOW
Glucose, Bld: 207 mg/dL — ABNORMAL HIGH (ref 70–99)
Potassium: 4.2 mmol/L (ref 3.5–5.1)
Sodium: 138 mmol/L (ref 135–145)

## 2023-06-22 LAB — HEPARIN LEVEL (UNFRACTIONATED): Heparin Unfractionated: 0.28 [IU]/mL — ABNORMAL LOW (ref 0.30–0.70)

## 2023-06-22 LAB — CBC
HCT: 29.1 % — ABNORMAL LOW (ref 39.0–52.0)
Hemoglobin: 9.8 g/dL — ABNORMAL LOW (ref 13.0–17.0)
MCH: 31 pg (ref 26.0–34.0)
MCHC: 33.7 g/dL (ref 30.0–36.0)
MCV: 92.1 fL (ref 80.0–100.0)
Platelets: 302 K/uL (ref 150–400)
RBC: 3.16 MIL/uL — ABNORMAL LOW (ref 4.22–5.81)
RDW: 15.6 % — ABNORMAL HIGH (ref 11.5–15.5)
WBC: 10.9 K/uL — ABNORMAL HIGH (ref 4.0–10.5)
nRBC: 0.3 % — ABNORMAL HIGH (ref 0.0–0.2)

## 2023-06-22 LAB — MRSA NEXT GEN BY PCR, NASAL: MRSA by PCR Next Gen: NOT DETECTED

## 2023-06-22 LAB — MAGNESIUM: Magnesium: 1.9 mg/dL (ref 1.7–2.4)

## 2023-06-22 LAB — LEGIONELLA PNEUMOPHILA SEROGP 1 UR AG: L. pneumophila Serogp 1 Ur Ag: NEGATIVE

## 2023-06-22 LAB — PHOSPHORUS: Phosphorus: 2.1 mg/dL — ABNORMAL LOW (ref 2.5–4.6)

## 2023-06-22 MED ORDER — POTASSIUM & SODIUM PHOSPHATES 280-160-250 MG PO PACK
1.0000 | PACK | Freq: Three times a day (TID) | ORAL | Status: AC
  Administered 2023-06-22 (×4): 1
  Filled 2023-06-22 (×4): qty 1

## 2023-06-22 MED ORDER — ENOXAPARIN SODIUM 40 MG/0.4ML IJ SOSY
40.0000 mg | PREFILLED_SYRINGE | INTRAMUSCULAR | Status: DC
  Administered 2023-06-22 – 2023-06-30 (×9): 40 mg via SUBCUTANEOUS
  Filled 2023-06-22 (×9): qty 0.4

## 2023-06-22 MED ORDER — POLYETHYLENE GLYCOL 3350 17 G PO PACK: 17.0000 g | PACK | Freq: Every day | ORAL | Status: DC

## 2023-06-22 MED ORDER — DOCUSATE SODIUM 50 MG/5ML PO LIQD
100.0000 mg | Freq: Two times a day (BID) | ORAL | Status: DC
Start: 2023-06-22 — End: 2023-06-23
  Administered 2023-06-22 – 2023-06-23 (×2): 100 mg
  Filled 2023-06-22 (×2): qty 10

## 2023-06-22 MED ORDER — HEPARIN BOLUS VIA INFUSION
1400.0000 [IU] | Freq: Once | INTRAVENOUS | Status: AC
  Administered 2023-06-22: 1400 [IU] via INTRAVENOUS
  Filled 2023-06-22: qty 1400

## 2023-06-22 NOTE — Progress Notes (Signed)
 PHARMACY CONSULT NOTE  Pharmacy Consult for Electrolyte Monitoring and Replacement   Recent Labs: Potassium (mmol/L)  Date Value  06/22/2023 4.2   Magnesium (mg/dL)  Date Value  04/54/0981 1.9   Calcium (mg/dL)  Date Value  19/14/7829 8.0 (L)   Albumin (g/dL)  Date Value  56/21/3086 2.0 (L)   Phosphorus (mg/dL)  Date Value  57/84/6962 2.1 (L)   Sodium (mmol/L)  Date Value  06/22/2023 138   Assessment: 83 y.o. male with history of COPD prior small cell lung cancer, DM 2, HTN.  Pharmacy is asked to follow and replace electrolytes while in CCU  Goal of Therapy:  Electrolytes WNL  Plan:  --Phos 2.1, Phos-Nak 1 packet per tube 4x/day x 4 doses --Re-check electrolytes with AM labs tomorrow  Page Boast 06/22/2023 8:15 AM

## 2023-06-22 NOTE — Progress Notes (Signed)
 NAME:  Edwin Jones, MRN:  [id number], DOB:  14-Oct-1940, LOS: 4 ADMISSION DATE:  06/18/2023  History of Present Illness:  Edwin Jones is an 83 year old African-American male with a history of type 2 diabetes, hypertension, small cell lung cancer, and COPD who presented to the ED via EMS with complaints of respiratory distress.  History is obtained from EMS records as well as ED records as patient is currently intubated and sedated and there is no family at the bedside.  Per ED records, EMS was called for respiratory distress.  Upon EMS arrival, patient's oxygen saturation was 60%.  Bag mask ventilation was started and patient was transported to the ED after an unsuccessful intubation attempt in the field.  Upon ED arrival, patient had copious amounts of secretions in his oral cavity.  Initial ED workup revealed multifocal pneumonia with sepsis.  Patient was placed on BiPAP however he became combative and started coughing profusely and also had large amounts of secretions that filled up the BiPAP mask.  His oxygen saturation dropped and he was emergently intubated.  PCCM was consulted for further management. ED course: His initial venous gas in the ED showed a pH of 7.25, PCO2 of 49, and PaO2 of 54.  Chemistry showed CO2 level of 19, glucose level of 360 mg/dL, BUN of 35, and a creatinine of 1.90 which is close to his baseline.  CBC showed a WBC of 19.2 platelets of 426, neutrophils of 15.0.  His respiratory panel was negative for influenza and COVID as well as a RSV.  His chest x-ray showed multifocal pneumonia.  He was started on broad-spectrum antibiotics and fluid resuscitation was also initiated.  Postintubation, patient became hypotensive requiring pressors.  He was started on peripheral norepinephrine. Upon arrival in the ICU, patient was intubated and sedated.  His initial troponin was 96 however the second troponin peaked to 4173.  Lactic acid was 4.7 and peaked at 5.1.  He was started on a heparin  infusion and cardiology was consulted.  A 2D echo was ordered.  He was also given multiple fluid boluses.  Significant Hospital Events: Including procedures, antibiotic start and stop dates in addition to other pertinent events   04/11: Admitted with multifocal pneumonia, NSTEMI, sepsis and septic shock requiring mechanical intubation  04/13: Pt remains mechanically intubated on minimal vent settings.  Will perform WUA and SBT once spouse arrives at bedside   04/14: Remains intubated sedated, improving WBC and hemodynamics.  Interim History / Subjective:  Remains intubated and sedated. Follows commands when on SAT. Remains with extensive secretions. Will SAT and SBT this am.   Objective   Blood pressure (!) 102/55, pulse 77, temperature 99.4 F (37.4 C), temperature source Oral, resp. rate 20, height 6\' 3"  (1.905 m), weight 94.9 kg, SpO2 95%.    Vent Mode: PRVC FiO2 (%):  [30 %] 30 % Set Rate:  [18 bmp] 18 bmp Vt Set:  [550 mL] 550 mL PEEP:  [5 cmH20] 5 cmH20 Plateau Pressure:  [17 cmH20-21 cmH20] 21 cmH20   Intake/Output Summary (Last 24 hours) at 06/22/2023 0944 Last data filed at 06/22/2023 0530 Gross per 24 hour  Intake 2674.27 ml  Output 700 ml  Net 1974.27 ml   Filed Weights   06/20/23 0318 06/21/23 0448 06/22/23 0400  Weight: 87.8 kg 90.2 kg 94.9 kg    Examination: General: Intubated and sedated HENT: Supple neck, reactive pupils  Lungs: Coarse bilateral breath entry Cardiovascular: Normal S1, Normal S2, RRR  Abdomen: Soft, non  tender, non distended, +BS  Extremities: Warm and well perfused no edema.   Labs and imaging were reviewed.    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  # Septic shock secondary to.. # Community-acquired pneumonia complicated by acute hypoxic respiratory failure requiring intubation and mechanical ventilation on 04/11. Still with profuse secretions will likely need more time until extubation.  # History of small cell lung cancer status post chemoradiation in 2018 in remission now with suspicion of recurrence. # NSTEMI likely demand #AKI with Cr. 1.95 mg/dl from a baseline of 1.6mg /dl secondary to septic ATN.   Neuro: Precedex and fentanyl for analgesia sedation RASS 0 to -1 CPOT less than 2. SAT/SBT this am.  Pulmonary: Cefepime and Azithromycin for a total of 7 and 3 days resp. Duonebs schedule and budesonide inhaled bid. Sputum Culture. Will plan on EBUS likely down the course or as outpatient.  [facility]S: NE for MAP > 65. C/w Heparin drip, Aspirin 81mg  daily. Appreciated cardiology's input.  Renal: Monitor UOP.  Heme: heparin drip for 72 hours.  Endo: PCO 140-180   Best Practice (right click and "Reselect all SmartList Selections" daily)   Diet/type: tubefeeds DVT prophylaxis systemic heparin Pressure ulcer(s): N/A GI prophylaxis: PPI Lines: N/A Foley:  N/A Code Status:  full code  Last date of multidisciplinary goals of care discussion [06/21/2023]  Critical care time: 50 minutes.     Annitta Kindler, MD [facility] 06/22/2023 9:44 AM

## 2023-06-22 NOTE — Progress Notes (Signed)
 [facility] CLINIC CARDIOLOGY PROGRESS NOTE   Patient ID: Noah Lembke MRN: [id number] DOB/AGE: 84-Nov-1942 84 y.o.  Admit date: 06/18/2023 Referring Physician Dr. Curley Double Primary Physician Willene Harper Georgeann Kindred, MD  Primary Cardiologist None Reason for Consultation Elevated troponins  HPI: Edwin Jones is a 83 y.o. male with a past medical history of hypertension, type 2 diabetes mellitus, COPD, small cell lung cancer who presented to the ED on 06/18/2023 for respiratory distress. Patient was found to have elevated troponins, NSTEMI. Cardiology was consulted for further evaluation.   Interval History:  -Patient remains intubated, sedated. -Levophed off since early this AM.  -Renal function stable today.  -Hgb stable on AM labs.  -Failed SBT yesterday with tachypnea and heavy secretions.   Review of systems complete and found to be negative unless listed above    Vitals:   06/22/23 0600 06/22/23 0630 06/22/23 0800 06/22/23 0820  BP: (!) 108/54 (!) 105/54 (!) 102/55   Pulse: 77 77 77   Resp: 18 (!) 24 20   Temp:   99.4 F (37.4 C)   TempSrc:   Oral   SpO2: 96% 96% 94% 95%  Weight:      Height:         Intake/Output Summary (Last 24 hours) at 06/22/2023 0932 Last data filed at 06/22/2023 0530 Gross per 24 hour  Intake 2674.27 ml  Output 700 ml  Net 1974.27 ml     PHYSICAL EXAM General: Chronically ill appearing elderly male, well nourished, in no acute distress. HEENT: Normocephalic and atraumatic. Neck: No JVD.  Lungs: Intubated. Mechanical breath sounds. Heart: HRRR. Normal S1 and S2 without gallops or murmurs. Radial & DP pulses 2+ bilaterally. Abdomen: Non-distended appearing.  Msk: Normal strength and tone for age. Extremities: No clubbing, cyanosis or edema.     LABS: Basic Metabolic Panel: Recent Labs    06/21/23 0450 06/22/23 0452  NA 136 138  K 3.7 4.2  CL 106 109  CO2 23 23  GLUCOSE 247* 207*  BUN 31* 33*  CREATININE 1.61* 1.65*  CALCIUM 7.8* 8.0*   MG 2.0 1.9  PHOS 1.8* 2.1*   Liver Function Tests: Recent Labs    06/20/23 0248 06/21/23 0450  ALBUMIN 2.2* 2.0*   No results for input(s): "LIPASE", "AMYLASE" in the last 72 hours. CBC: Recent Labs    06/21/23 0450 06/22/23 0452  WBC 13.4* 10.9*  HGB 9.9* 9.8*  HCT 29.9* 29.1*  MCV 94.3 92.1  PLT 306 302   Cardiac Enzymes: Recent Labs    06/20/23 0242  TROPONINIHS 2,700*   BNP: No results for input(s): "BNP" in the last 72 hours.  D-Dimer: No results for input(s): "DDIMER" in the last 72 hours. Hemoglobin A1C: No results for input(s): "HGBA1C" in the last 72 hours.  Fasting Lipid Panel: No results for input(s): "CHOL", "HDL", "LDLCALC", "TRIG", "CHOLHDL", "LDLDIRECT" in the last 72 hours. Thyroid Function Tests: No results for input(s): "TSH", "T4TOTAL", "T3FREE", "THYROIDAB" in the last 72 hours.  Invalid input(s): "FREET3" Anemia Panel: No results for input(s): "VITAMINB12", "FOLATE", "FERRITIN", "TIBC", "IRON", "RETICCTPCT" in the last 72 hours.  DG Chest Port 1 View Result Date: 06/22/2023 CLINICAL DATA:  Hypoxia.  Acute respiratory failure. EXAM: PORTABLE CHEST 1 VIEW COMPARISON:  06/19/2023 FINDINGS: Endotracheal tube tip is approximately 4.2 cm above the base of the carina. The NG tube passes into the stomach although the distal tip position is not included on the film. Right Port-A-Cath remains in place. Similar asymmetric elevation right hemidiaphragm with patchy  airspace disease in both lung bases, right greater than left. Interstitial markings are diffusely coarsened with chronic features. Telemetry leads overlie the chest. IMPRESSION: 1. Endotracheal tube tip is approximately 4.2 cm above the base of the carina. 2. Patchy airspace disease in both lung bases, right greater than left. Imaging features could be related to atelectasis or infiltrate. Electronically Signed   By: Donnal Fusi M.D.   On: 06/22/2023 07:37     ECHO 06/19/2023 1. Left  ventricular ejection fraction, by estimation, is 50 to 55%. The left ventricle has low normal function. The left ventricle demonstrates regional wall motion abnormalities (see scoring diagram/findings for  description). There is mild concentric left ventricular hypertrophy. Left ventricular diastolic parameters are consistent with Grade I diastolic dysfunction (impaired relaxation).   2. Right ventricular systolic function is normal. The right ventricular size is normal.   3. Left atrial size was mild to moderately dilated.   4. Right atrial size was mild to moderately dilated.   5. The mitral valve is normal in structure. Mild mitral valve regurgitation. No evidence of mitral stenosis.   6. The aortic valve is normal in structure. Aortic valve regurgitation is trivial. No aortic stenosis is present.   7. The inferior vena cava is normal in size with greater than 50% respiratory variability, suggesting right atrial pressure of 3 mmHg.    TELEMETRY reviewed by me 06/22/23: sinus rhythm, rate 70s PVCs  EKG reviewed by me 06/22/23: sinus rhythm, rate 82 bpm  DATA reviewed by me 06/22/23: last 24h vitals tele labs imaging I/O, hospitalist and critical care progress notes  Principal Problem:   Acute hypoxic respiratory failure (HCC)    ASSESSMENT AND PLAN: Edwin Jones is a 83 y.o. male with a past medical history of chronic HFpEF, hypertension, type 2 diabetes mellitus, COPD, small cell lung cancer who presented to the ED on 06/18/2023 for respiratory distress. Patient was found to have elevated troponins. Cardiology was consulted for further evaluation.   # NSTEMI, Type I # Acute hypoxic respiratory failure # Septic shock, Pneumonia # COPD exacerbation  # Chronic HFpEF Patient presented to the ED via EMS with respiratory distress. Patient was placed on BiPAP but became combative and his O2 desaturated and needed emergent intubation. CXR revealed left mid/lower pneumonia. IV antibiotics and  fluids were started. Patient became hypotensive requiring Levophed. Trops elevated and trending 96 > 4,173 > 5,798 > 6,129 > 2,700. BNP normal 87. EKG in ED with sinus rhythm rate 82 bpm with no significant ST changes. Echo this admission showed EF 50-55%, regional wall motion abnormalities of LV, grade I diastolic dysfunction. Patient remains intubated, on propofol, and levophed. HR is stable and BP borderline. -Continue heparin infusion for NSTEMI.  -Aspirin 81 mg daily ordered per tube.  Patient has documented statin allergy. -No plan for further cardiac diagnostics at this time. Reassess when patient is extubated and hemodynamically stable. -Defer GDMT at this time. Patient remains intubated. -Pneumonia, acute respiratory failure managed per primary and critical care teams.   This patient's case was discussed and created with Dr. Custovic and she is in agreement.  Signed:  Hamp Levine, PA-C  06/22/2023, 9:32 AM [facility]

## 2023-06-22 NOTE — Telephone Encounter (Signed)
Nothing further is needed. 

## 2023-06-22 NOTE — Progress Notes (Signed)
 PHARMACY - ANTICOAGULATION CONSULT NOTE  Pharmacy Consult for IV Heparin  Indication: chest pain/ACS  Patient Measurements: Height: 6\' 3"  (190.5 cm) Weight: 87.7 kg (193 lb 5.5 oz) IBW/kg (Calculated) : 84.5 HEPARIN DW (KG): 95.3  Labs: Recent Labs    06/19/23 0639 06/19/23 0837 06/19/23 2009 06/20/23 0242 06/20/23 0248 06/20/23 1113 06/21/23 0450 06/21/23 1414 06/21/23 2154 06/22/23 0452  HGB  --   --    < >  --  10.6*  --  9.9*  --   --  9.8*  HCT  --   --   --   --  32.5*  --  29.9*  --   --  29.1*  PLT  --   --   --   --  320  --  306  --   --  302  HEPARINUNFRC  --    < >  --   --  0.55   < > 0.24* 0.45 0.39 0.28*  CREATININE  --   --   --   --  1.84*  --  1.61*  --   --  1.65*  TROPONINIHS 6,254*  --   --  2,700*  --   --   --   --   --   --    < > = values in this interval not displayed.    Estimated Creatinine Clearance: 40.5 mL/min (A) (by C-G formula based on SCr of 1.65 mg/dL (H)).   Medical History: Past Medical History:  Diagnosis Date   Cancer (HCC)    COPD (chronic obstructive pulmonary disease) (HCC)    Diabetes mellitus without complication (HCC)    GERD without esophagitis    Hepatitis C virus    history of hep C treated with Harvoni   Hypercholesterolemia    Hypertension    Seasonal allergies    Assessment:  83 y.o. male with history of COPD prior small cell lung cancer, DM 2, HTN.  Pharmacy consulted to dose heparin , admitted with ACS/NSTEMI.  Pt was on heparin 5000 units SQ Q8H, last dose on 4/11 @ 2214.  Date Time HL Rate/Comment 4/12 0837 1.09 SUPRAtherapeutic 4/12 2009 0.76 SUPRAtherapeutic 4/13     0248    0.55    Therapeutic x 1  4/14     0450    0.24    SUBtherapeutic 4/14 1414 0.45 Therapeutic x 1 4/15 0452 0.28 Subtherapeutic  Goal of Therapy:  Heparin level 0.3-0.7 units/ml Monitor platelets by anticoagulation protocol: Yes   Plan:  --Bolus 1400 units x 1 --Increase heparin infusion to 1300 units/hr --Re-check HL in 8  hours after rate change --Daily CBC per protocol while on IV heparin  Thank you for involving pharmacy in this patient's care.   Coretta Dexter, PharmD, [facility] 06/22/2023 6:08 AM

## 2023-06-22 NOTE — Plan of Care (Signed)
  Problem: Metabolic: Goal: Ability to maintain appropriate glucose levels will improve Outcome: Progressing   Problem: Nutritional: Goal: Maintenance of adequate nutrition will improve Outcome: Progressing   Problem: Skin Integrity: Goal: Risk for impaired skin integrity will decrease Outcome: Progressing   Problem: Clinical Measurements: Goal: Diagnostic test results will improve Outcome: Progressing Goal: Respiratory complications will improve Outcome: Progressing Goal: Cardiovascular complication will be avoided Outcome: Progressing

## 2023-06-22 NOTE — Plan of Care (Signed)
  Problem: Nutritional: Goal: Maintenance of adequate nutrition will improve Outcome: Progressing   Problem: Clinical Measurements: Goal: Will remain free from infection Outcome: Progressing Goal: Respiratory complications will improve Outcome: Progressing Goal: Cardiovascular complication will be avoided Outcome: Progressing   Problem: Elimination: Goal: Will not experience complications related to bowel motility Outcome: Progressing Goal: Will not experience complications related to urinary retention Outcome: Progressing   Problem: Safety: Goal: Ability to remain free from injury will improve Outcome: Progressing

## 2023-06-22 NOTE — Telephone Encounter (Signed)
 Copied from CRM [id number]. Topic: Clinical - Prescription Issue >> Jun 22, 2023 12:44 PM Tyronne Galloway wrote: Reason for CRM: [facility] called regarding med Fluticasone-Umeclidin-Vilant (TRELEGY ELLIPTA) 100-62.5-25 MCG/ACT AEPB as it isn't covered by the patient's insurance. Alysia phone number [phone] ext [phone] stated there are multiple other options in combination the patient would be covered to use if it was okayed by provider Annitta Kindler.Aaron AasAaron Aas  1) Stiolto and Mometasone  2) Stiolto & Ciclesonide  or 3) Wixela and Spiriva Respimat. Fax Number is [phone].

## 2023-06-22 NOTE — Plan of Care (Signed)
  Problem: Education: Goal: Ability to describe self-care measures that may prevent or decrease complications (Diabetes Survival Skills Education) will improve Outcome: Not Progressing Goal: Individualized Educational Video(s) Outcome: Not Progressing   Problem: Fluid Volume: Goal: Ability to maintain a balanced intake and output will improve Outcome: Progressing   Problem: Health Behavior/Discharge Planning: Goal: Ability to identify and utilize available resources and services will improve Outcome: Not Progressing Goal: Ability to manage health-related needs will improve Outcome: Not Progressing   Problem: Education: Goal: Knowledge of General Education information will improve Description: Including pain rating scale, medication(s)/side effects and non-pharmacologic comfort measures Outcome: Not Progressing   Problem: Health Behavior/Discharge Planning: Goal: Ability to manage health-related needs will improve Outcome: Not Progressing   Problem: Clinical Measurements: Goal: Ability to maintain clinical measurements within normal limits will improve Outcome: Progressing Goal: Respiratory complications will improve Outcome: Not Progressing Goal: Cardiovascular complication will be avoided Outcome: Progressing   Problem: Nutrition: Goal: Adequate nutrition will be maintained Outcome: Progressing   Problem: Coping: Goal: Level of anxiety will decrease Outcome: Progressing

## 2023-06-23 DIAGNOSIS — J9601 Acute respiratory failure with hypoxia: Secondary | ICD-10-CM | POA: Diagnosis not present

## 2023-06-23 DIAGNOSIS — J189 Pneumonia, unspecified organism: Secondary | ICD-10-CM | POA: Diagnosis not present

## 2023-06-23 DIAGNOSIS — A419 Sepsis, unspecified organism: Secondary | ICD-10-CM | POA: Diagnosis not present

## 2023-06-23 DIAGNOSIS — R6521 Severe sepsis with septic shock: Secondary | ICD-10-CM | POA: Diagnosis not present

## 2023-06-23 LAB — GLUCOSE, CAPILLARY
Glucose-Capillary: 145 mg/dL — ABNORMAL HIGH (ref 70–99)
Glucose-Capillary: 146 mg/dL — ABNORMAL HIGH (ref 70–99)
Glucose-Capillary: 134 mg/dL — ABNORMAL HIGH (ref 70–99)
Glucose-Capillary: 105 mg/dL — ABNORMAL HIGH (ref 70–99)
Glucose-Capillary: 98 mg/dL (ref 70–99)
Glucose-Capillary: 121 mg/dL — ABNORMAL HIGH (ref 70–99)

## 2023-06-23 LAB — PHOSPHORUS: Phosphorus: 3.5 mg/dL (ref 2.5–4.6)

## 2023-06-23 LAB — BASIC METABOLIC PANEL WITH GFR
Anion gap: 8 (ref 5–15)
BUN: 38 mg/dL — ABNORMAL HIGH (ref 8–23)
CO2: 23 mmol/L (ref 22–32)
Calcium: 8.1 mg/dL — ABNORMAL LOW (ref 8.9–10.3)
Chloride: 108 mmol/L (ref 98–111)
Creatinine, Ser: 1.39 mg/dL — ABNORMAL HIGH (ref 0.61–1.24)
GFR, Estimated: 50 mL/min — ABNORMAL LOW (ref >60)
Glucose, Bld: 143 mg/dL — ABNORMAL HIGH (ref 70–99)
Potassium: 4.1 mmol/L (ref 3.5–5.1)
Sodium: 139 mmol/L (ref 135–145)

## 2023-06-23 LAB — CULTURE, BLOOD (ROUTINE X 2): Culture: NO GROWTH

## 2023-06-23 LAB — CBC
HCT: 31.1 % — ABNORMAL LOW (ref 39.0–52.0)
Hemoglobin: 10.2 g/dL — ABNORMAL LOW (ref 13.0–17.0)
MCH: 31 pg (ref 26.0–34.0)
MCHC: 32.8 g/dL (ref 30.0–36.0)
MCV: 94.5 fL (ref 80.0–100.0)
Platelets: 322 10*3/uL (ref 150–400)
RBC: 3.29 MIL/uL — ABNORMAL LOW (ref 4.22–5.81)
RDW: 15.9 % — ABNORMAL HIGH (ref 11.5–15.5)
WBC: 12.8 10*3/uL — ABNORMAL HIGH (ref 4.0–10.5)
nRBC: 0.2 % (ref 0.0–0.2)

## 2023-06-23 LAB — MAGNESIUM: Magnesium: 1.9 mg/dL (ref 1.7–2.4)

## 2023-06-23 MED ORDER — FENTANYL CITRATE PF 50 MCG/ML IJ SOSY: 25.0000 ug | PREFILLED_SYRINGE | INTRAMUSCULAR | Status: DC | PRN

## 2023-06-23 MED ORDER — THIAMINE HCL 100 MG PO TABS
100.0000 mg | ORAL_TABLET | Freq: Every day | ORAL | Status: DC
  Filled 2023-06-23 (×4): qty 1

## 2023-06-23 MED ORDER — METOPROLOL TARTRATE 25 MG PO TABS
25.0000 mg | ORAL_TABLET | Freq: Two times a day (BID) | ORAL | Status: DC
  Administered 2023-06-27 – 2023-07-05 (×16): 25 mg
  Filled 2023-06-23 (×17): qty 1

## 2023-06-23 MED ORDER — METOPROLOL TARTRATE 5 MG/5ML IV SOLN: 5.0000 mg | Freq: Four times a day (QID) | INTRAVENOUS | Status: DC | PRN

## 2023-06-23 MED ORDER — METOPROLOL TARTRATE 5 MG/5ML IV SOLN
2.5000 mg | Freq: Once | INTRAVENOUS | Status: AC
  Administered 2023-06-23: 2.5 mg via INTRAVENOUS
  Filled 2023-06-23: qty 5

## 2023-06-23 MED ORDER — SODIUM CHLORIDE 3 % IN NEBU
4.0000 mL | INHALATION_SOLUTION | Freq: Three times a day (TID) | RESPIRATORY_TRACT | Status: AC
  Administered 2023-06-23 – 2023-06-26 (×7): 4 mL via RESPIRATORY_TRACT
  Filled 2023-06-23 (×9): qty 4

## 2023-06-23 MED ORDER — ACETAMINOPHEN 650 MG RE SUPP
650.0000 mg | RECTAL | Status: DC | PRN
  Administered 2023-06-30: 650 mg via RECTAL
  Filled 2023-06-23 (×2): qty 1

## 2023-06-23 MED ORDER — FUROSEMIDE 10 MG/ML IJ SOLN
40.0000 mg | Freq: Once | INTRAMUSCULAR | Status: AC
  Administered 2023-06-23: 40 mg via INTRAVENOUS
  Filled 2023-06-23: qty 4

## 2023-06-23 MED ORDER — POLYETHYLENE GLYCOL 3350 17 G PO PACK: 17.0000 g | PACK | Freq: Every day | ORAL | Status: DC

## 2023-06-23 MED ORDER — IPRATROPIUM-ALBUTEROL 0.5-2.5 (3) MG/3ML IN SOLN
3.0000 mL | Freq: Four times a day (QID) | RESPIRATORY_TRACT | Status: DC
  Administered 2023-06-23 – 2023-06-29 (×23): 3 mL via RESPIRATORY_TRACT
  Filled 2023-06-23 (×24): qty 3

## 2023-06-23 MED ORDER — SODIUM CHLORIDE 3 % IN NEBU
4.0000 mL | INHALATION_SOLUTION | Freq: Three times a day (TID) | RESPIRATORY_TRACT | Status: DC
  Filled 2023-06-23: qty 4

## 2023-06-23 MED ORDER — FUROSEMIDE 10 MG/ML IJ SOLN
40.0000 mg | Freq: Once | INTRAMUSCULAR | Status: AC
Start: 2023-06-23 — End: 2023-06-23
  Administered 2023-06-23: 40 mg via INTRAVENOUS
  Filled 2023-06-23: qty 4

## 2023-06-23 MED ORDER — SODIUM CHLORIDE 0.9 % IV SOLN
2.0000 g | INTRAVENOUS | Status: AC
  Administered 2023-06-23 – 2023-06-24 (×2): 2 g via INTRAVENOUS
  Filled 2023-06-23 (×2): qty 20

## 2023-06-23 MED ORDER — METOPROLOL SUCCINATE ER 50 MG PO TB24: 25.0000 mg | ORAL_TABLET | Freq: Every day | ORAL | Status: DC

## 2023-06-23 NOTE — Progress Notes (Signed)
 [facility] CLINIC CARDIOLOGY PROGRESS NOTE   Patient ID: Edwin Jones MRN: [id number] DOB/AGE: [birth year] 83 y.o.  Admit date: 06/18/2023 Referring Physician Dr. Curley Double Primary Physician Willene Harper Georgeann Kindred, MD  Primary Cardiologist None Reason for Consultation Elevated troponins  HPI: Edwin Jones is a 83 y.o. male with a past medical history of hypertension, type 2 diabetes mellitus, COPD, small cell lung cancer who presented to the ED on 06/18/2023 for respiratory distress. Patient was found to have elevated troponins, NSTEMI. Cardiology was consulted for further evaluation.   Interval History:  -Patient remains intubated, and sedated. -Cr improved this AM. -Hgb improved this AM. -Failed SBT on 04/16 with tachypnea and continues to have profuse secretions.  -Tele revealed 1 run of NSVT overnight. This AM tele with Sinus rhythm rates in 80s. -HR stable and BP borderline.  Review of systems complete and found to be negative unless listed above    Vitals:   06/23/23 0500 06/23/23 0600 06/23/23 0736 06/23/23 0903  BP: 131/73 132/67  104/62  Pulse: (!) 101 86  80  Resp: 20 (!) 21    Temp:      TempSrc:      SpO2: 95% 97% 96%   Weight:      Height:         Intake/Output Summary (Last 24 hours) at 06/23/2023 1012 Last data filed at 06/23/2023 0951 Gross per 24 hour  Intake 2856.47 ml  Output 1100 ml  Net 1756.47 ml     PHYSICAL EXAM General: Chronically ill appearing elderly male, well nourished, in no acute distress. HEENT: Normocephalic and atraumatic. Neck: No JVD.  Lungs: Intubated. Mechanical breath sounds. Heart: HRRR. Normal S1 and S2 without gallops or murmurs. Radial & DP pulses 2+ bilaterally. Abdomen: Non-distended appearing.  Msk: Normal strength and tone for age. Extremities: No clubbing, cyanosis or edema.     LABS: Basic Metabolic Panel: Recent Labs    06/22/23 0452 06/23/23 0457  NA 138 139  K 4.2 4.1  CL 109 108  CO2 23 23  GLUCOSE 207* 143*   BUN 33* 38*  CREATININE 1.65* 1.39*  CALCIUM 8.0* 8.1*  MG 1.9 1.9  PHOS 2.1* 3.5   Liver Function Tests: Recent Labs    06/21/23 0450  ALBUMIN 2.0*   No results for input(s): "LIPASE", "AMYLASE" in the last 72 hours. CBC: Recent Labs    06/22/23 0452 06/23/23 0457  WBC 10.9* 12.8*  HGB 9.8* 10.2*  HCT 29.1* 31.1*  MCV 92.1 94.5  PLT 302 322   Cardiac Enzymes: No results for input(s): "CKTOTAL", "CKMB", "CKMBINDEX", "TROPONINIHS" in the last 72 hours.  BNP: No results for input(s): "BNP" in the last 72 hours.  D-Dimer: No results for input(s): "DDIMER" in the last 72 hours. Hemoglobin A1C: No results for input(s): "HGBA1C" in the last 72 hours.  Fasting Lipid Panel: No results for input(s): "CHOL", "HDL", "LDLCALC", "TRIG", "CHOLHDL", "LDLDIRECT" in the last 72 hours. Thyroid Function Tests: No results for input(s): "TSH", "T4TOTAL", "T3FREE", "THYROIDAB" in the last 72 hours.  Invalid input(s): "FREET3" Anemia Panel: No results for input(s): "VITAMINB12", "FOLATE", "FERRITIN", "TIBC", "IRON", "RETICCTPCT" in the last 72 hours.  DG Chest Port 1 View Result Date: 06/22/2023 CLINICAL DATA:  Hypoxia.  Acute respiratory failure. EXAM: PORTABLE CHEST 1 VIEW COMPARISON:  06/19/2023 FINDINGS: Endotracheal tube tip is approximately 4.2 cm above the base of the carina. The NG tube passes into the stomach although the distal tip position is not included on the film. Right Port-A-Cath  remains in place. Similar asymmetric elevation right hemidiaphragm with patchy airspace disease in both lung bases, right greater than left. Interstitial markings are diffusely coarsened with chronic features. Telemetry leads overlie the chest. IMPRESSION: 1. Endotracheal tube tip is approximately 4.2 cm above the base of the carina. 2. Patchy airspace disease in both lung bases, right greater than left. Imaging features could be related to atelectasis or infiltrate. Electronically Signed   By: Kennith Center M.D.   On: 06/22/2023 07:37     ECHO 06/19/2023 1. Left ventricular ejection fraction, by estimation, is 50 to 55%. The left ventricle has low normal function. The left ventricle demonstrates regional wall motion abnormalities (see scoring diagram/findings for  description). There is mild concentric left ventricular hypertrophy. Left ventricular diastolic parameters are consistent with Grade I diastolic dysfunction (impaired relaxation).   2. Right ventricular systolic function is normal. The right ventricular size is normal.   3. Left atrial size was mild to moderately dilated.   4. Right atrial size was mild to moderately dilated.   5. The mitral valve is normal in structure. Mild mitral valve regurgitation. No evidence of mitral stenosis.   6. The aortic valve is normal in structure. Aortic valve regurgitation is trivial. No aortic stenosis is present.   7. The inferior vena cava is normal in size with greater than 50% respiratory variability, suggesting right atrial pressure of 3 mmHg.    TELEMETRY reviewed by me 06/23/23: sinus rhythm, rate 80s PVCs. (1 run of NSVT overnight)  EKG reviewed by me 06/23/23: sinus rhythm, rate 82 bpm  DATA reviewed by me 06/23/23: last 24h vitals tele labs imaging I/O, hospitalist and critical care progress notes  Principal Problem:   Acute hypoxic respiratory failure (HCC)    ASSESSMENT AND PLAN: Edwin Jones is a 83 y.o. male with a past medical history of chronic HFpEF, hypertension, type 2 diabetes mellitus, COPD, small cell lung cancer who presented to the ED on 06/18/2023 for respiratory distress. Patient was found to have elevated troponins. Cardiology was consulted for further evaluation.   # NSTEMI, Type I # Acute hypoxic respiratory failure # Septic shock, Pneumonia # COPD exacerbation  # Chronic HFpEF Patient presented to the ED via EMS with respiratory distress. Patient was placed on BiPAP but became combative and his O2  desaturated and needed emergent intubation. CXR revealed left mid/lower pneumonia. IV antibiotics and fluids were started. Patient became hypotensive requiring Levophed. Trops elevated and trending 96 > 4,173 > 5,798 > 6,129 > 2,700. BNP normal 87. EKG in ED on 04/11 with sinus rhythm rate 82 bpm with no significant ST changes. Echo this admission showed EF 50-55%, regional wall motion abnormalities of LV, grade I diastolic dysfunction. Patient remains intubated and sedated. IV levophed stopped on 04/15. HR is stable and BP borderline. -Tele revealed 1 run of NSVT overnight. Ordered metoprolol tartrate 25 mg bid per tube.  -Aspirin 81 mg daily ordered per tube.  Patient has documented statin allergy. -No plan for further cardiac diagnostics at this time. Reassess when patient is extubated and hemodynamically stable. -Defer GDMT at this time. Patient remains intubated. -Pneumonia, acute respiratory failure managed per primary and critical care teams.   This patient's case was discussed and created with Dr. Melton Alar and she is in agreement.  Signed:  Gale Journey, PA-C  06/23/2023, 10:12 AM [facility]

## 2023-06-23 NOTE — Progress Notes (Signed)
 NAME:  Edwin Jones, MRN:  [id number], DOB:  1940-11-06, LOS: 5 ADMISSION DATE:  06/18/2023, CONSULTATION DATE: 06/18/2023  History of Present Illness:  Edwin Jones is an 83 year old African-American male with a history of type 2 diabetes, hypertension, small cell lung cancer, and COPD who presented to the ED via EMS with complaints of respiratory distress.  History is obtained from EMS records as well as ED records as patient is currently intubated and sedated and there is no family at the bedside.  Per ED records, EMS was called for respiratory distress.  Upon EMS arrival, patient's oxygen saturation was 60%.  Bag mask ventilation was started and patient was transported to the ED after an unsuccessful intubation attempt in the field.  Upon ED arrival, patient had copious amounts of secretions in his oral cavity.  Initial ED workup revealed multifocal pneumonia with sepsis.  Patient was placed on BiPAP however he became combative and started coughing profusely and also had large amounts of secretions that filled up the BiPAP mask.  His oxygen saturation dropped and he was emergently intubated.  PCCM was consulted for further management. ED course: His initial venous gas in the ED showed a pH of 7.25, PCO2 of 49, and PaO2 of 54.  Chemistry showed CO2 level of 19, glucose level of 360 mg/dL, BUN of 35, and a creatinine of 1.90 which is close to his baseline.  CBC showed a WBC of 19.2 platelets of 426, neutrophils of 15.0.  His respiratory panel was negative for influenza and COVID as well as a RSV.  His chest x-ray showed multifocal pneumonia.  He was started on broad-spectrum antibiotics and fluid resuscitation was also initiated.  Postintubation, patient became hypotensive requiring pressors.  He was started on peripheral norepinephrine. Upon arrival in the ICU, patient was intubated and sedated.  His initial troponin was 96 however the second troponin peaked to 4173.  Lactic acid was 4.7 and peaked at 5.1.   He was started on a heparin infusion and cardiology was consulted.  A 2D echo was ordered.  He was also given multiple fluid boluses.   Pertinent  Medical History  COPD  Type II Diabetes Mellitus  GERD  Hepatitis C (treated with harvoni) Hypercholesterolemia  HTN  Seasonal Allergies  Small Cell Lung Cancer diagnosed in 2018 s/p chemoradiation in remission   Micro Data:   Blood 04/11>>staph hominis; staph species  Resp panel by RT-PCR 04/11>>negative  MRSA PCR 04/14>>negative  Resp 04/14>>NGTD   Anti-infectives (From admission, onward)    Start     Dose/Rate Route Frequency Ordered Stop   06/19/23 1700  azithromycin (ZITHROMAX) 500 mg in sodium chloride 0.9 % 250 mL IVPB  Status:  Discontinued        500 mg 250 mL/hr over 60 Minutes Intravenous Every 24 hours 06/18/23 2113 06/22/23 1040   06/19/23 1200  ceFEPIme (MAXIPIME) 2 g in sodium chloride 0.9 % 100 mL IVPB        2 g 200 mL/hr over 30 Minutes Intravenous Every 12 hours 06/19/23 1051 06/24/23 2359   06/18/23 2130  vancomycin (VANCOREADY) IVPB 2000 mg/400 mL        2,000 mg 200 mL/hr over 120 Minutes Intravenous  Once 06/18/23 2053 06/19/23 0046   06/18/23 2115  cefTRIAXone (ROCEPHIN) 1 g in sodium chloride 0.9 % 100 mL IVPB  Status:  Discontinued        1 g 200 mL/hr over 30 Minutes Intravenous Every 24 hours 06/18/23 2113 06/19/23 1051  06/18/23 2101  vancomycin variable dose per unstable renal function (pharmacist dosing)  Status:  Discontinued         Does not apply See admin instructions 06/18/23 2102 06/19/23 1050   06/18/23 1730  levofloxacin (LEVAQUIN) IVPB 750 mg        750 mg 100 mL/hr over 90 Minutes Intravenous  Once 06/18/23 1720 06/18/23 1918       Significant Hospital Events: Including procedures, antibiotic start and stop dates in addition to other pertinent events   04/11: Admitted with multifocal pneumonia, NSTEMI, sepsis and septic shock requiring mechanical intubation  04/13: Pt remains  mechanically intubated on minimal vent settings.  Will perform WUA and SBT once spouse arrives at bedside  04/16: No acute events overnight remains mechanically intubated on minimal vent settings.  Secretions significantly improved will performed WUA and SBT    Interim History / Subjective:  No acute events overnight   Objective   Blood pressure 132/67, pulse 86, temperature 98.9 F (37.2 C), temperature source Axillary, resp. rate (!) 21, height 6\' 3"  (1.905 m), weight 93.9 kg, SpO2 96%.    Vent Mode: CPAP;PSV FiO2 (%):  [30 %] 30 % Set Rate:  [18 bmp] 18 bmp Vt Set:  [550 mL] 550 mL PEEP:  [5 cmH20] 5 cmH20 Pressure Support:  [5 cmH20-8 cmH20] 8 cmH20   Intake/Output Summary (Last 24 hours) at 06/23/2023 0849 Last data filed at 06/23/2023 0600 Gross per 24 hour  Intake 2525.73 ml  Output 1100 ml  Net 1425.73 ml   Filed Weights   06/21/23 0448 06/22/23 0400 06/23/23 0430  Weight: 90.2 kg 94.9 kg 93.9 kg    Examination: General: Acute on chronically-ill appearing male, NAD mechanically intubated HENT: Supple, no JVD Lungs: Faint rhonchi throughout, even, non labored  Cardiovascular: NSR, s1s2, no m/r/g, 2+ radial/1+ distal pulses, no edema  Abdomen: +BS x4, soft, non distended  Extremities: Normal bulk and tone  Neuro: Sedated, not following commands, opens eyes to voice, withdraws from painful stimulation, PERRL GU: Indwelling foley catheter draining yellow urine   Resolved Hospital Problem list     Assessment & Plan:   #Acute metabolic encephalopathy  #Mechanical intubation pain/discomfort  - Treat metabolic derangements  - Avoid sedating medications as able  - Maintain RASS goal 0 to -1 - PAD protocol to maintain RASS goal: propofol and fentanyl gtts  - WUA daily  #NSTEMI  #Hypotension secondary  Echo 06/19/23: EF 50 to 55%; grade I diastolic dysfunction; mild mitral valve regurgitation; trivial aortic valve regurgitation  - Continuous telemetry monitoring  -  Troponin peaked at 6,254 - Completed 72 hrs of heparin gtt  - Prn levophed gtt to maintain map 65 or higher  - Cardiology consulted appreciate input   #Acute respiratory failure  #Pneumonia  #Mechanical intubation  - Full vent support for now: vent settings reviewed and established  - Continue lung protective strategies  - Maintain plateau pressures less than 30 cm H2O - SBT once all parameters met  - Intermittent CXR's and ABG's  - Scheduled bronchodilator therapy  - Nebulized steroids   #Acute kidney injury likely secondary to ATN #Lactic acidosis   #Hypomagnesia  - Trend BMP and lactic acid  - Replace electrolytes as indicated  - Strict I&O's - Avoid nephrotoxic agents   #Pneumonia  - Trend WBC and monitor fever curve  - Trend PCT - Follow cultures  - Continue abx as outlined above  #Anemia without obvious signs of bleeding  - Trend  CBC  - Monitor for s/sx of bleeding  - Transfuse for hgb <7  #Type II diabetes mellitus  - CBG's q4hrs  - SSI  - Target CBG range 140 to 180 - Follow hypo/hyperglycemic protocol   Best Practice (right click and "Reselect all SmartList Selections" daily)   Diet/type: tubefeeds DVT prophylaxis subcutaneous lovenox  Pressure ulcer(s): N/A GI prophylaxis: H2B Lines: Portacath preset on admission  Foley:  Indwelling foley catheter present and still needed  Code Status:  full code Last date of multidisciplinary goals of care discussion [06/23/2023]  04/16: Will update pts family when they arrive at bedside   Labs   CBC: Recent Labs  Lab 06/18/23 1717 06/19/23 0452 06/20/23 0248 06/21/23 0450 06/22/23 0452 06/23/23 0457  WBC 19.2* 21.4* 16.8* 13.4* 10.9* 12.8*  NEUTROABS 15.0*  --   --   --   --   --   HGB 12.8* 11.9* 10.6* 9.9* 9.8* 10.2*  HCT 40.1 36.3* 32.5* 29.9* 29.1* 31.1*  MCV 97.3 96.3 95.0 94.3 92.1 94.5  PLT 426* 398 320 306 302 322    Basic Metabolic Panel: Recent Labs  Lab 06/19/23 0452 06/20/23 0248  06/21/23 0450 06/22/23 0452 06/23/23 0457  NA 141 139 136 138 139  K 4.2 3.7 3.7 4.2 4.1  CL 107 106 106 109 108  CO2 21* 23 23 23 23   GLUCOSE 278* 259* 247* 207* 143*  BUN 35* 35* 31* 33* 38*  CREATININE 1.95* 1.84* 1.61* 1.65* 1.39*  CALCIUM 8.4* 8.2* 7.8* 8.0* 8.1*  MG 1.6* 2.2 2.0 1.9 1.9  PHOS 3.2 2.3* 1.8* 2.1* 3.5   GFR: Estimated Creatinine Clearance: 48.1 mL/min (A) (by C-G formula based on SCr of 1.39 mg/dL (H)). Recent Labs  Lab 06/18/23 2218 06/18/23 2342 06/19/23 0837 06/19/23 1316 06/19/23 1549 06/20/23 0242 06/20/23 0248 06/20/23 1445 06/21/23 0450 06/22/23 0452 06/23/23 0457  PROCALCITON 1.60  --   --   --   --  3.98  --   --   --   --   --   WBC  --    < >  --   --   --   --  16.8*  --  13.4* 10.9* 12.8*  LATICACIDVEN 4.6*   < > 5.6* 5.2* 4.2*  --   --  1.6  --   --   --    < > = values in this interval not displayed.    Liver Function Tests: Recent Labs  Lab 06/18/23 1717 06/20/23 0248 06/21/23 0450  AST 20  --   --   ALT 10  --   --   ALKPHOS 62  --   --   BILITOT 0.6  --   --   PROT 7.8  --   --   ALBUMIN 3.1* 2.2* 2.0*   No results for input(s): "LIPASE", "AMYLASE" in the last 168 hours. No results for input(s): "AMMONIA" in the last 168 hours.  ABG    Component Value Date/Time   PHART 7.31 (L) 06/19/2023 0258   PCO2ART 41 06/19/2023 0258   PO2ART 83 06/19/2023 0258   HCO3 20.7 06/19/2023 0258   TCO2 25 05/06/2017 0329   ACIDBASEDEF 5.5 (H) 06/19/2023 0258   O2SAT 97.1 06/19/2023 0258     Coagulation Profile: Recent Labs  Lab 06/18/23 2218  INR 1.2    Cardiac Enzymes: No results for input(s): "CKTOTAL", "CKMB", "CKMBINDEX", "TROPONINI" in the last 168 hours.  HbA1C: Hgb A1c MFr Bld  Date/Time  Value Ref Range Status  06/18/2023 10:18 PM 7.2 (H) 4.8 - 5.6 % Final    Comment:    (NOTE)         Prediabetes: 5.7 - 6.4         Diabetes: >6.4         Glycemic control for adults with diabetes: <7.0   05/07/2017 05:00 AM  9.1 (H) 4.8 - 5.6 % Final    Comment:    (NOTE) Pre diabetes:          5.7%-6.4% Diabetes:              >6.4% Glycemic control for   <7.0% adults with diabetes     CBG: Recent Labs  Lab 06/22/23 1601 06/22/23 1931 06/22/23 2338 06/23/23 0324 06/23/23 0741  GLUCAP 138* 117* 152* 145* 146*    Review of Systems:   Unable to assess pt mechanically intubated   Past Medical History:  He,  has a past medical history of Cancer (HCC), COPD (chronic obstructive pulmonary disease) (HCC), Diabetes mellitus without complication (HCC), GERD without esophagitis, Hepatitis C virus, Hypercholesterolemia, Hypertension, and Seasonal allergies.   Surgical History:   Past Surgical History:  Procedure Laterality Date   COLONOSCOPY     approximately 11 years ago   FLEXIBLE BRONCHOSCOPY N/A 11/05/2016   Procedure: FLEXIBLE BRONCHOSCOPY;  Surgeon: Merwyn Katos, MD;  Location: [facility] ORS;  Service: Pulmonary;  Laterality: N/A;   IR FLUORO GUIDE CV LINE RIGHT  05/06/2017   IR INTRAVSC STENT CERV CAROTID W/O EMB-PROT MOD SED INC ANGIO  05/06/2017   IR PERCUTANEOUS ART THROMBECTOMY/INFUSION INTRACRANIAL INC DIAG ANGIO  05/06/2017   IR US GUIDE VASC ACCESS LEFT  05/06/2017   IR US GUIDE VASC ACCESS RIGHT  05/06/2017   IR US GUIDE VASC ACCESS RIGHT  05/06/2017   PORTA CATH INSERTION N/A 11/23/2016   Procedure: Shelda Pal Cath Insertion;  Surgeon: Annice Needy, MD;  Location: [facility] INVASIVE CV LAB;  Service: Cardiovascular;  Laterality: N/A;   RADIOLOGY WITH ANESTHESIA N/A 05/06/2017   Procedure: IR WITH ANESTHESIA;  Surgeon: Radiologist, Medication, MD;  Location: [facility] OR;  Service: Radiology;  Laterality: N/A;   TEE WITHOUT CARDIOVERSION N/A 05/10/2017   Procedure: TRANSESOPHAGEAL ECHOCARDIOGRAM (TEE);  Surgeon: Thurmon Fair, MD;  Location: [facility] ENDOSCOPY;  Service: Cardiovascular;  Laterality: N/A;   TONSILLECTOMY       Social History:   reports that he quit smoking about 19 years ago. His smoking use included  cigarettes. He started smoking about 69 years ago. He has a 50 pack-year smoking history. He has never used smokeless tobacco. He reports that he does not drink alcohol and does not use drugs.   Family History:  His family history includes Lung cancer (age of onset: 72) in his sister; Ovarian cancer in his sister; Skin cancer in his mother and sister; Throat cancer (age of onset: 8) in his brother; Throat cancer (age of onset: 32) in his brother.   Allergies Allergies  Allergen Reactions   Penicillin G Anaphylaxis    Other reaction(s): Other (See Comments) Couldn't breath among other things Has patient had a PCN reaction causing immediate rash, facial/tongue/throat swelling, SOB or lightheadedness with hypotension: Yes Has patient had a PCN reaction causing severe rash involving mucus membranes or skin necrosis: No Has patient had a PCN reaction that required hospitalization: Yes Has patient had a PCN reaction occurring within the last 10 years: No If all of the above answers are "NO", then may  proceed with Ce   Shellfish-Derived Products Anaphylaxis   Statins     Other reaction(s): Other (See Comments) Muscle spasms - can't walk - couldn't turn over in bed   Erythromycin Itching    All mycins   Tamsulosin     Other reaction(s): Dizziness   Tuberculin Ppd     Other reaction(s): Other (See Comments) False test    Iodinated Contrast Media Hives     Home Medications  Prior to Admission medications   Medication Sig Start Date End Date Taking? Authorizing Provider  ascorbic acid (VITAMIN C) 500 MG tablet Take 500 mg by mouth daily.   Yes [provider]  aspirin 81 MG chewable tablet Chew 4 tablets (324 mg total) by mouth daily. Patient taking differently: Chew 81 mg by mouth daily. 06/15/17  Yes Love, Renay Carota, PA-C  cholecalciferol (VITAMIN D3) 25 MCG (1000 UNIT) tablet Take 1,000 Units by mouth daily.   Yes [provider]  citalopram (CELEXA) 20 MG tablet Take 20  mg by mouth daily.   Yes [provider]  cyanocobalamin (VITAMIN B12) 500 MCG tablet Take 500 mcg by mouth daily.   Yes [provider]  ezetimibe (ZETIA) 10 MG tablet Take 10 mg by mouth daily. 06/08/23  Yes [provider]  Fluticasone-Umeclidin-Vilant (TRELEGY ELLIPTA) 100-62.5-25 MCG/ACT AEPB Inhale 1 puff into the lungs daily. 06/16/23  Yes Assaker, Marianne Shirts, MD  Garlic 2 MG CAPS Take by mouth.   Yes [provider]  glimepiride (AMARYL) 4 MG tablet Take 1 tablet (4 mg total) by mouth daily with breakfast. 06/15/17  Yes Love, Renay Carota, PA-C  insulin glargine (LANTUS) 100 UNIT/ML injection Inject 10-12 Units into the skin daily.   Yes [provider]  Omega-3 Fatty Acids (OMEGA-3 FISH OIL PO) Take by mouth.   Yes [provider]  pantoprazole (PROTONIX) 40 MG tablet Take 1 tablet (40 mg total) by mouth daily. 06/15/17  Yes Love, Renay Carota, PA-C  zinc sulfate, 50mg  elemental zinc, 220 (50 Zn) MG capsule Take 220 mg by mouth daily.   Yes [provider]  Apple Cider Vinegar 188 MG CAPS Take by mouth.    [provider]  blood glucose meter kit and supplies KIT Dispense based on patient and insurance preference. Use up to four times daily as directed. ICD: E11.9 06/15/17   Zelda Hickman, PA-C  colchicine 0.6 MG tablet Take by mouth. 05/29/22   [provider]  Fluticasone-Umeclidin-Vilant (TRELEGY ELLIPTA) 100-62.5-25 MCG/ACT AEPB Inhale 1 puff into the lungs daily. 06/16/23   Assaker, Marianne Shirts, MD  protein supplement shake (PREMIER PROTEIN) LIQD Take 325 mLs (11 oz total) by mouth 4 (four) times daily. 06/14/17   Zelda Hickman, PA-C     Critical care time: 35 minutes       Janey Meek, AGNP  Pulmonary/Critical Care Pager [phone] (please enter 7 digits) PCCM Consult Pager [phone] (please enter 7 digits)

## 2023-06-23 NOTE — Progress Notes (Addendum)
 Updated pts spouse and grandson at bedside regarding pts condition and current plan of care.  All questions answered.  Will continue to monitor and assess pt.  Janey Meek, AGNP  Pulmonary/Critical Care Pager [phone] (please enter 7 digits) PCCM Consult Pager [phone] (please enter 7 digits)

## 2023-06-23 NOTE — Plan of Care (Signed)
  Problem: Fluid Volume: Goal: Ability to maintain a balanced intake and output will improve Outcome: Progressing   Problem: Metabolic: Goal: Ability to maintain appropriate glucose levels will improve Outcome: Progressing   Problem: Nutritional: Goal: Maintenance of adequate nutrition will improve Outcome: Progressing   Problem: Skin Integrity: Goal: Risk for impaired skin integrity will decrease Outcome: Progressing   Problem: Tissue Perfusion: Goal: Adequacy of tissue perfusion will improve Outcome: Progressing   Problem: Clinical Measurements: Goal: Ability to maintain clinical measurements within normal limits will improve Outcome: Progressing Goal: Diagnostic test results will improve Outcome: Progressing Goal: Respiratory complications will improve Outcome: Progressing Goal: Cardiovascular complication will be avoided Outcome: Progressing

## 2023-06-23 NOTE — Progress Notes (Signed)
 PHARMACY CONSULT NOTE  Pharmacy Consult for Electrolyte Monitoring and Replacement   Recent Labs: Potassium (mmol/L)  Date Value  06/23/2023 4.1   Magnesium (mg/dL)  Date Value  60/45/4098 1.9   Calcium (mg/dL)  Date Value  11/91/4782 8.1 (L)   Albumin (g/dL)  Date Value  95/62/1308 2.0 (L)   Phosphorus (mg/dL)  Date Value  65/78/4696 3.5   Sodium (mmol/L)  Date Value  06/23/2023 139   Assessment: 83 y.o. male with history of COPD prior small cell lung cancer, DM 2, HTN.  Pharmacy is asked to follow and replace electrolytes while in CCU  Goal of Therapy:  Electrolytes WNL  Plan:  --No electrolyte replacement indicated at this time --Re-check electrolytes with AM labs tomorrow  Page Boast 06/23/2023 8:22 AM

## 2023-06-24 ENCOUNTER — Inpatient Hospital Stay

## 2023-06-24 ENCOUNTER — Ambulatory Visit

## 2023-06-24 ENCOUNTER — Telehealth: Payer: Self-pay | Admitting: Pulmonary Disease

## 2023-06-24 DIAGNOSIS — J9601 Acute respiratory failure with hypoxia: Secondary | ICD-10-CM | POA: Diagnosis not present

## 2023-06-24 DIAGNOSIS — A419 Sepsis, unspecified organism: Secondary | ICD-10-CM | POA: Diagnosis not present

## 2023-06-24 DIAGNOSIS — J189 Pneumonia, unspecified organism: Secondary | ICD-10-CM | POA: Diagnosis not present

## 2023-06-24 DIAGNOSIS — R6521 Severe sepsis with septic shock: Secondary | ICD-10-CM | POA: Diagnosis not present

## 2023-06-24 LAB — BASIC METABOLIC PANEL WITH GFR
Anion gap: 8 (ref 5–15)
BUN: 43 mg/dL — ABNORMAL HIGH (ref 8–23)
CO2: 28 mmol/L (ref 22–32)
Calcium: 8.7 mg/dL — ABNORMAL LOW (ref 8.9–10.3)
Chloride: 105 mmol/L (ref 98–111)
Creatinine, Ser: 1.63 mg/dL — ABNORMAL HIGH (ref 0.61–1.24)
GFR, Estimated: 42 mL/min — ABNORMAL LOW (ref >60)
Glucose, Bld: 91 mg/dL (ref 70–99)
Potassium: 4.3 mmol/L (ref 3.5–5.1)
Sodium: 141 mmol/L (ref 135–145)

## 2023-06-24 LAB — CBC
HCT: 32.4 % — ABNORMAL LOW (ref 39.0–52.0)
Hemoglobin: 10.9 g/dL — ABNORMAL LOW (ref 13.0–17.0)
MCH: 30.8 pg (ref 26.0–34.0)
MCHC: 33.6 g/dL (ref 30.0–36.0)
MCV: 91.5 fL (ref 80.0–100.0)
Platelets: 357 10*3/uL (ref 150–400)
RBC: 3.54 MIL/uL — ABNORMAL LOW (ref 4.22–5.81)
RDW: 15.7 % — ABNORMAL HIGH (ref 11.5–15.5)
WBC: 13.3 10*3/uL — ABNORMAL HIGH (ref 4.0–10.5)
nRBC: 0.2 % (ref 0.0–0.2)

## 2023-06-24 LAB — CULTURE, RESPIRATORY W GRAM STAIN

## 2023-06-24 LAB — MAGNESIUM: Magnesium: 1.9 mg/dL (ref 1.7–2.4)

## 2023-06-24 LAB — GLUCOSE, CAPILLARY
Glucose-Capillary: 100 mg/dL — ABNORMAL HIGH (ref 70–99)
Glucose-Capillary: 101 mg/dL — ABNORMAL HIGH (ref 70–99)
Glucose-Capillary: 111 mg/dL — ABNORMAL HIGH (ref 70–99)
Glucose-Capillary: 126 mg/dL — ABNORMAL HIGH (ref 70–99)
Glucose-Capillary: 80 mg/dL (ref 70–99)
Glucose-Capillary: 96 mg/dL (ref 70–99)

## 2023-06-24 LAB — PHOSPHORUS: Phosphorus: 3.5 mg/dL (ref 2.5–4.6)

## 2023-06-24 NOTE — Evaluation (Signed)
 Physical Therapy Evaluation Patient Details Name: Edwin Jones MRN: [id number] DOB: 1940/04/04 Today's Date: 06/24/2023  History of Present Illness  Pt is an 83 y/o M admitted on 06/18/23 after presenting to the ED with c/o respiratory distress. Pt required intubation, found to have multifocal PNA with sepsis. Pt also being treated for NSTEMI, septic shock. Pt extubated 06/23/23. PMH: DM2, HTN, small cell lung CA, COPD, Hep C, hypercholesterolemia  Clinical Impression  Pt seen for PT evaluation with co-tx with OT; daughter arriving mid way through session. Per daughter, pt was bed level prior to admission; able to complete supine<>sit without assistance, sit EOB without assistance to consume meals. On this date, pt requires max assist +2 for supine<>sit but sits EOB ~10 minutes with mod fade to min assist assist. Pt with RUE edema noted. Pt tolerated session well. Pt would benefit from ongoing PT services to progress strength, balance, & endurance to increase independence, reduce caregiver burden & facilitate return to PLOF.        If plan is discharge home, recommend the following: Two people to help with walking and/or transfers;Two people to help with bathing/dressing/bathroom;Assistance with feeding   Can travel by private vehicle   No    Equipment Recommendations Other (comment) (defer to next venue)  Recommendations for Other Services       Functional Status Assessment Patient has had a recent decline in their functional status and demonstrates the ability to make significant improvements in function in a reasonable and predictable amount of time.     Precautions / Restrictions Precautions Precautions: Fall Restrictions Weight Bearing Restrictions Per Provider Order: No      Mobility  Bed Mobility Overal bed mobility: Needs Assistance Bed Mobility: Supine to Sit, Sit to Supine, Rolling Rolling: Total assist, +2 for physical assistance, Used rails   Supine to sit: Max  assist, HOB elevated, Used rails Sit to supine: Max assist, HOB elevated, Used rails        Transfers                        Ambulation/Gait                  Stairs            Wheelchair Mobility     Tilt Bed    Modified Rankin (Stroke Patients Only)       Balance Overall balance assessment: Needs assistance Sitting-balance support: Feet supported, Bilateral upper extremity supported Sitting balance-Leahy Scale: Poor Sitting balance - Comments: mod fade to min assist, R lateral lean, assistance to correct                                     Pertinent Vitals/Pain Pain Assessment Pain Assessment: Faces Faces Pain Scale: No hurt    Home Living Family/patient expects to be discharged to:: Private residence Living Arrangements: Spouse/significant other Available Help at Discharge: Family;Available 24 hours/day Type of Home: House         Home Layout: One level Home Equipment: Other (comment);Wheelchair - manual (bed with adjustable frame, hoyer lift) Additional Comments: Working with HHPT.    Prior Function Prior Level of Function : Needs assist             Mobility Comments: Pt was able to transition supine<>sit without assistance, sat EOB for meals without assistance, utilized hoyer lift to transfer OOB to w/c.  ADLs Comments: Wife provided assistance for peri hygiene & bathing at bed level     Extremity/Trunk Assessment   Upper Extremity Assessment Upper Extremity Assessment: Generalized weakness (RUE edema, able to initiate grip strength but has tremors in wrist/forearm. 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, slight RLE edema)       Communication   Communication Communication: Impaired Factors Affecting Communication: Reduced clarity of speech;Difficulty expressing self    Cognition Arousal:  Alert Behavior During Therapy: Flat affect   PT - Cognitive impairments: History of cognitive impairments, Difficult to assess Difficult to assess due to: Impaired communication                     PT - Cognition Comments: daughter reports beginnings of dementia, pt responds to name Following commands: Impaired Following commands impaired: Follows one step commands inconsistently, Follows one step commands with increased time     Cueing Cueing Techniques: Verbal cues, Gestural cues, Tactile cues, Visual cues     General Comments General comments (skin integrity, edema, etc.): Pt on 5L/min via nasal cannula, lowest SPO2 87%, max HR 132 bpm.    Exercises     Assessment/Plan    PT Assessment Patient needs continued PT services  PT Problem List Decreased strength;Cardiopulmonary status limiting activity;Decreased range of motion;Decreased cognition;Decreased activity tolerance;Decreased balance;Decreased mobility;Decreased knowledge of use of DME;Decreased safety awareness       PT Treatment Interventions DME instruction;Balance training;Neuromuscular re-education;Cognitive remediation;Functional mobility training;Therapeutic activities;Wheelchair mobility training;Patient/family education;Therapeutic exercise    PT Goals (Current goals can be found in the Care Plan section)  Acute Rehab PT Goals Patient Stated Goal: get better, return to PLOF PT Goal Formulation: With family Time For Goal Achievement: 07/08/23 Potential to Achieve Goals: Fair    Frequency Min 2X/week     Co-evaluation PT/OT/SLP Co-Evaluation/Treatment: Yes Reason for Co-Treatment: Complexity of the patient's impairments (multi-system involvement);Necessary to address cognition/behavior during functional activity;For patient/therapist safety PT goals addressed during session: Balance;Mobility/safety with mobility         AM-PAC PT "6 Clicks" Mobility  Outcome Measure Help needed turning from your  back to your side while in a flat bed without using bedrails?: Total Help needed moving from lying on your back to sitting on the side of a flat bed without using bedrails?: Total Help needed moving to and from a bed to a chair (including a wheelchair)?: Total Help needed standing up from a chair using your arms (e.g., wheelchair or bedside chair)?: Total Help needed to walk in hospital room?: Total Help needed climbing 3-5 steps with a railing? : Total 6 Click Score: 6    End of Session Equipment Utilized During Treatment: Oxygen Activity Tolerance: Patient tolerated treatment well Patient left: in bed;with call bell/phone within reach;with bed alarm set Nurse Communication: Mobility status PT Visit Diagnosis: Other abnormalities of gait and mobility (R26.89);Muscle weakness (generalized) (M62.81)    Time: 8756-4332 PT Time Calculation (min) (ACUTE ONLY): 29 min   Charges:   PT Evaluation $PT Eval Moderate Complexity: 1 Mod   PT General Charges $$ ACUTE PT VISIT: 1 Visit         Aleda Grana, PT, DPT 06/24/23, 3:19 PM   Sandi Mariscal 06/24/2023, 3:17 PM

## 2023-06-24 NOTE — Progress Notes (Signed)
 NAME:  Edwin Jones, MRN:  [id number], DOB:  [birth year], LOS: 6 ADMISSION DATE:  06/18/2023, CONSULTATION DATE: 06/18/2023  History of Present Illness:  Edwin Jones is an 83 year old African-American male with a history of type 2 diabetes, hypertension, small cell lung cancer, and COPD who presented to the ED via EMS with complaints of respiratory distress.  History is obtained from EMS records as well as ED records as patient is currently intubated and sedated and there is no family at the bedside.  Per ED records, EMS was called for respiratory distress.  Upon EMS arrival, patient's oxygen saturation was 60%.  Bag mask ventilation was started and patient was transported to the ED after an unsuccessful intubation attempt in the field.  Upon ED arrival, patient had copious amounts of secretions in his oral cavity.  Initial ED workup revealed multifocal pneumonia with sepsis.  Patient was placed on BiPAP however he became combative and started coughing profusely and also had large amounts of secretions that filled up the BiPAP mask.  His oxygen saturation dropped and he was emergently intubated.  PCCM was consulted for further management. ED course: His initial venous gas in the ED showed a pH of 7.25, PCO2 of 49, and PaO2 of 54.  Chemistry showed CO2 level of 19, glucose level of 360 mg/dL, BUN of 35, and a creatinine of 1.90 which is close to his baseline.  CBC showed a WBC of 19.2 platelets of 426, neutrophils of 15.0.  His respiratory panel was negative for influenza and COVID as well as a RSV.  His chest x-ray showed multifocal pneumonia.  He was started on broad-spectrum antibiotics and fluid resuscitation was also initiated.  Postintubation, patient became hypotensive requiring pressors.  He was started on peripheral norepinephrine. Upon arrival in the ICU, patient was intubated and sedated.  His initial troponin was 96 however the second troponin peaked to 4173.  Lactic acid was 4.7 and peaked at 5.1.   He was started on a heparin infusion and cardiology was consulted.  A 2D echo was ordered.  He was also given multiple fluid boluses.   Pertinent  Medical History  COPD  Type II Diabetes Mellitus  GERD  Hepatitis C (treated with harvoni) Hypercholesterolemia  HTN  Seasonal Allergies  Small Cell Lung Cancer diagnosed in 2018 s/p chemoradiation in remission   Micro Data:   Blood 04/11>>staph hominis; staph species  Resp panel by RT-PCR 04/11>>negative  MRSA PCR 04/14>>negative  Resp 04/14>>NGTD   Anti-infectives (From admission, onward)    Start     Dose/Rate Route Frequency Ordered Stop   06/23/23 1200  cefTRIAXone (ROCEPHIN) 2 g in sodium chloride 0.9 % 100 mL IVPB        2 g 200 mL/hr over 30 Minutes Intravenous Every 24 hours 06/23/23 1113 06/25/23 1159   06/19/23 1700  azithromycin (ZITHROMAX) 500 mg in sodium chloride 0.9 % 250 mL IVPB  Status:  Discontinued        500 mg 250 mL/hr over 60 Minutes Intravenous Every 24 hours 06/18/23 2113 06/22/23 1040   06/19/23 1200  ceFEPIme (MAXIPIME) 2 g in sodium chloride 0.9 % 100 mL IVPB  Status:  Discontinued        2 g 200 mL/hr over 30 Minutes Intravenous Every 12 hours 06/19/23 1051 06/23/23 1112   06/18/23 2130  vancomycin (VANCOREADY) IVPB 2000 mg/400 mL        2,000 mg 200 mL/hr over 120 Minutes Intravenous  Once 06/18/23 2053 06/19/23 0046  06/18/23 2115  cefTRIAXone (ROCEPHIN) 1 g in sodium chloride 0.9 % 100 mL IVPB  Status:  Discontinued        1 g 200 mL/hr over 30 Minutes Intravenous Every 24 hours 06/18/23 2113 06/19/23 1051   06/18/23 2101  vancomycin variable dose per unstable renal function (pharmacist dosing)  Status:  Discontinued         Does not apply See admin instructions 06/18/23 2102 06/19/23 1050   06/18/23 1730  levofloxacin (LEVAQUIN) IVPB 750 mg        750 mg 100 mL/hr over 90 Minutes Intravenous  Once 06/18/23 1720 06/18/23 1918       Significant Hospital Events: Including procedures, antibiotic  start and stop dates in addition to other pertinent events   04/11: Admitted with multifocal pneumonia, NSTEMI, sepsis and septic shock requiring mechanical intubation  04/13: Pt remains mechanically intubated on minimal vent settings.  Will perform WUA and SBT once spouse arrives at bedside  04/16: No acute events overnight remains mechanically intubated on minimal vent settings.  Secretions significantly improved will performed WUA and SBT.  Successfully extubated to HFNC  04/17: Pt remains extubated on HFNC @7L  with no signs of resp distress.  IF pt remains stable will transfer service to [facility]   Interim History / Subjective:  No acute events overnight   Objective   Blood pressure 112/68, pulse 81, temperature 97.9 F (36.6 C), temperature source Oral, resp. rate (!) 24, height 6\' 3"  (1.905 m), weight 88.9 kg, SpO2 95%.    Vent Mode: CPAP;PSV FiO2 (%):  [30 %] 30 % PEEP:  [5 cmH20] 5 cmH20 Pressure Support:  [8 cmH20] 8 cmH20   Intake/Output Summary (Last 24 hours) at 06/24/2023 4098 Last data filed at 06/24/2023 0600 Gross per 24 hour  Intake 330.74 ml  Output 4000 ml  Net -3669.26 ml   Filed Weights   06/22/23 0400 06/23/23 0430 06/24/23 1191  Weight: 94.9 kg 93.9 kg 88.9 kg    Examination: General: Acute on chronically-ill appearing male, NAD on HFNC HENT: Supple, no JVD Lungs: Rhonchi throughout, even, non labored  Cardiovascular: Irregular irregular, s1s2, no m/r/g, 2+ radial/1+ distal pulses, trace BUE/BLE edema  Abdomen: +BS x4, soft, non distended, non distended  Extremities: Normal bulk and tone  Neuro: Awake but extremely weak, attempting to follow some commands, lifts head off pillow and raises LUE on command, attempting to raise RUE on command but extremely weak  GU: External catheter draining yellow urine   Resolved Hospital Problem list   Mechanical intubation  Hypotension  Lactic acidosis   Assessment & Plan:   #Acute metabolic encephalopathy  - Treat  metabolic derangements  - Avoid sedating medications as able  - CT Head pending  - PT/OT   #NSTEMI  Echo 06/19/23: EF 50 to 55%; grade I diastolic dysfunction; mild mitral valve regurgitation; trivial aortic valve regurgitation  - Continuous telemetry monitoring  - Troponin peaked at 6,254 - Completed 72 hrs of heparin gtt  - Prn levophed gtt to maintain map 65 or higher  - Cardiology consulted appreciate input   #Acute respiratory failure  #Pneumonia  - Supplemental O2 for dyspnea and/or hypoxia  - Maintain O2 sats 92% or higher  - Intermittent CXR's and ABG's  - Scheduled bronchodilator therapy  - Nebulized steroids  - Hypertonic saline  - Aggressive pulmonary hygiene   #Acute kidney injury likely secondary to ATN #Hypomagnesia  - Trend BMP and lactic acid  - Replace electrolytes as indicated  -  Strict I&O's - Avoid nephrotoxic agents   #Pneumonia  - Trend WBC and monitor fever curve  - Trend PCT - Follow cultures  - Continue abx as outlined above  #Anemia without obvious signs of bleeding  - Trend CBC  - Monitor for s/sx of bleeding  - Transfuse for hgb <7  #Type II diabetes mellitus  - CBG's q4hrs  - SSI  - Long acting insulin discontinued for now  - Target CBG range 140 to 180 - Follow hypo/hyperglycemic protocol   Best Practice (right click and "Reselect all SmartList Selections" daily)   Diet/type: NPO pending speech evaluation post CT Head  DVT prophylaxis subcutaneous lovenox  Pressure ulcer(s): N/A GI prophylaxis: H2B Lines: Portacath present on admission  Foley:  N/A Code Status:  full code Last date of multidisciplinary goals of care discussion [06/24/2023]  04/17: Will update pts family when they arrive at bedside   Labs   CBC: Recent Labs  Lab 06/18/23 1717 06/19/23 0452 06/20/23 0248 06/21/23 0450 06/22/23 0452 06/23/23 0457 06/24/23 0508  WBC 19.2*   < > 16.8* 13.4* 10.9* 12.8* 13.3*  NEUTROABS 15.0*  --   --   --   --   --   --    HGB 12.8*   < > 10.6* 9.9* 9.8* 10.2* 10.9*  HCT 40.1   < > 32.5* 29.9* 29.1* 31.1* 32.4*  MCV 97.3   < > 95.0 94.3 92.1 94.5 91.5  PLT 426*   < > 320 306 302 322 357   < > = values in this interval not displayed.    Basic Metabolic Panel: Recent Labs  Lab 06/20/23 0248 06/21/23 0450 06/22/23 0452 06/23/23 0457 06/24/23 0508  NA 139 136 138 139 141  K 3.7 3.7 4.2 4.1 4.3  CL 106 106 109 108 105  CO2 23 23 23 23 28   GLUCOSE 259* 247* 207* 143* 91  BUN 35* 31* 33* 38* 43*  CREATININE 1.84* 1.61* 1.65* 1.39* 1.63*  CALCIUM 8.2* 7.8* 8.0* 8.1* 8.7*  MG 2.2 2.0 1.9 1.9 1.9  PHOS 2.3* 1.8* 2.1* 3.5 3.5   GFR: Estimated Creatinine Clearance: 41 mL/min (A) (by C-G formula based on SCr of 1.63 mg/dL (H)). Recent Labs  Lab 06/18/23 2218 06/18/23 2342 06/19/23 0837 06/19/23 1316 06/19/23 1549 06/20/23 0242 06/20/23 0248 06/20/23 1445 06/21/23 0450 06/22/23 0452 06/23/23 0457 06/24/23 0508  PROCALCITON 1.60  --   --   --   --  3.98  --   --   --   --   --   --   WBC  --    < >  --   --   --   --    < >  --  13.4* 10.9* 12.8* 13.3*  LATICACIDVEN 4.6*   < > 5.6* 5.2* 4.2*  --   --  1.6  --   --   --   --    < > = values in this interval not displayed.    Liver Function Tests: Recent Labs  Lab 06/18/23 1717 06/20/23 0248 06/21/23 0450  AST 20  --   --   ALT 10  --   --   ALKPHOS 62  --   --   BILITOT 0.6  --   --   PROT 7.8  --   --   ALBUMIN 3.1* 2.2* 2.0*   No results for input(s): "LIPASE", "AMYLASE" in the last 168 hours. No results for input(s): "AMMONIA" in  the last 168 hours.  ABG    Component Value Date/Time   PHART 7.31 (L) 06/19/2023 0258   PCO2ART 41 06/19/2023 0258   PO2ART 83 06/19/2023 0258   HCO3 20.7 06/19/2023 0258   TCO2 25 05/06/2017 0329   ACIDBASEDEF 5.5 (H) 06/19/2023 0258   O2SAT 97.1 06/19/2023 0258     Coagulation Profile: Recent Labs  Lab 06/18/23 2218  INR 1.2    Cardiac Enzymes: No results for input(s): "CKTOTAL",  "CKMB", "CKMBINDEX", "TROPONINI" in the last 168 hours.  HbA1C: Hgb A1c MFr Bld  Date/Time Value Ref Range Status  06/18/2023 10:18 PM 7.2 (H) 4.8 - 5.6 % Final    Comment:    (NOTE)         Prediabetes: 5.7 - 6.4         Diabetes: >6.4         Glycemic control for adults with diabetes: <7.0   05/07/2017 05:00 AM 9.1 (H) 4.8 - 5.6 % Final    Comment:    (NOTE) Pre diabetes:          5.7%-6.4% Diabetes:              >6.4% Glycemic control for   <7.0% adults with diabetes     CBG: Recent Labs  Lab 06/23/23 1619 06/23/23 1954 06/23/23 2307 06/24/23 0344 06/24/23 0744  GLUCAP 105* 98 121* 101* 80    Review of Systems:   Unable to assess pt mechanically intubated   Past Medical History:  He,  has a past medical history of Cancer (HCC), COPD (chronic obstructive pulmonary disease) (HCC), Diabetes mellitus without complication (HCC), GERD without esophagitis, Hepatitis C virus, Hypercholesterolemia, Hypertension, and Seasonal allergies.   Surgical History:   Past Surgical History:  Procedure Laterality Date   COLONOSCOPY     approximately 11 years ago   FLEXIBLE BRONCHOSCOPY N/A 11/05/2016   Procedure: FLEXIBLE BRONCHOSCOPY;  Surgeon: Merwyn Katos, MD;  Location: [facility] ORS;  Service: Pulmonary;  Laterality: N/A;   IR FLUORO GUIDE CV LINE RIGHT  05/06/2017   IR INTRAVSC STENT CERV CAROTID W/O EMB-PROT MOD SED INC ANGIO  05/06/2017   IR PERCUTANEOUS ART THROMBECTOMY/INFUSION INTRACRANIAL INC DIAG ANGIO  05/06/2017   IR US GUIDE VASC ACCESS LEFT  05/06/2017   IR US GUIDE VASC ACCESS RIGHT  05/06/2017   IR US GUIDE VASC ACCESS RIGHT  05/06/2017   PORTA CATH INSERTION N/A 11/23/2016   Procedure: Shelda Pal Cath Insertion;  Surgeon: Annice Needy, MD;  Location: [facility] INVASIVE CV LAB;  Service: Cardiovascular;  Laterality: N/A;   RADIOLOGY WITH ANESTHESIA N/A 05/06/2017   Procedure: IR WITH ANESTHESIA;  Surgeon: Radiologist, Medication, MD;  Location: [facility] OR;  Service: Radiology;   Laterality: N/A;   TEE WITHOUT CARDIOVERSION N/A 05/10/2017   Procedure: TRANSESOPHAGEAL ECHOCARDIOGRAM (TEE);  Surgeon: Thurmon Fair, MD;  Location: [facility] ENDOSCOPY;  Service: Cardiovascular;  Laterality: N/A;   TONSILLECTOMY       Social History:   reports that he quit smoking about 19 years ago. His smoking use included cigarettes. He started smoking about 69 years ago. He has a 50 pack-year smoking history. He has never used smokeless tobacco. He reports that he does not drink alcohol and does not use drugs.   Family History:  His family history includes Lung cancer (age of onset: 52) in his sister; Ovarian cancer in his sister; Skin cancer in his mother and sister; Throat cancer (age of onset: 58) in his brother; Throat cancer (age  of onset: 55) in his brother.   Allergies Allergies  Allergen Reactions   Penicillin G Anaphylaxis    Other reaction(s): Other (See Comments) Couldn't breath among other things Has patient had a PCN reaction causing immediate rash, facial/tongue/throat swelling, SOB or lightheadedness with hypotension: Yes Has patient had a PCN reaction causing severe rash involving mucus membranes or skin necrosis: No Has patient had a PCN reaction that required hospitalization: Yes Has patient had a PCN reaction occurring within the last 10 years: No If all of the above answers are "NO", then may proceed with Ce   Shellfish-Derived Products Anaphylaxis   Statins     Other reaction(s): Other (See Comments) Muscle spasms - can't walk - couldn't turn over in bed   Erythromycin Itching    All mycins   Tamsulosin     Other reaction(s): Dizziness   Tuberculin Ppd     Other reaction(s): Other (See Comments) False test    Iodinated Contrast Media Hives     Home Medications  Prior to Admission medications   Medication Sig Start Date End Date Taking? Authorizing Provider  ascorbic acid (VITAMIN C) 500 MG tablet Take 500 mg by mouth daily.   Yes [provider]   aspirin 81 MG chewable tablet Chew 4 tablets (324 mg total) by mouth daily. Patient taking differently: Chew 81 mg by mouth daily. 06/15/17  Yes Love, Evlyn Kanner, PA-C  cholecalciferol (VITAMIN D3) 25 MCG (1000 UNIT) tablet Take 1,000 Units by mouth daily.   Yes [provider]  citalopram (CELEXA) 20 MG tablet Take 20 mg by mouth daily.   Yes [provider]  cyanocobalamin (VITAMIN B12) 500 MCG tablet Take 500 mcg by mouth daily.   Yes [provider]  ezetimibe (ZETIA) 10 MG tablet Take 10 mg by mouth daily. 06/08/23  Yes [provider]  Fluticasone-Umeclidin-Vilant (TRELEGY ELLIPTA) 100-62.5-25 MCG/ACT AEPB Inhale 1 puff into the lungs daily. 06/16/23  Yes Assaker, [place] Bali, MD  Garlic 2 MG CAPS Take by mouth.   Yes [provider]  glimepiride (AMARYL) 4 MG tablet Take 1 tablet (4 mg total) by mouth daily with breakfast. 06/15/17  Yes Love, Evlyn Kanner, PA-C  insulin glargine (LANTUS) 100 UNIT/ML injection Inject 10-12 Units into the skin daily.   Yes [provider]  Omega-3 Fatty Acids (OMEGA-3 FISH OIL PO) Take by mouth.   Yes [provider]  pantoprazole (PROTONIX) 40 MG tablet Take 1 tablet (40 mg total) by mouth daily. 06/15/17  Yes Love, Evlyn Kanner, PA-C  zinc sulfate, 50mg  elemental zinc, 220 (50 Zn) MG capsule Take 220 mg by mouth daily.   Yes [provider]  Apple Cider Vinegar 188 MG CAPS Take by mouth.    [provider]  blood glucose meter kit and supplies KIT Dispense based on patient and insurance preference. Use up to four times daily as directed. ICD: E11.9 06/15/17   Jacquelynn Cree, PA-C  colchicine 0.6 MG tablet Take by mouth. 05/29/22   [provider]  Fluticasone-Umeclidin-Vilant (TRELEGY ELLIPTA) 100-62.5-25 MCG/ACT AEPB Inhale 1 puff into the lungs daily. 06/16/23   Assaker, [place]ali, MD  protein supplement shake (PREMIER PROTEIN) LIQD Take 325 mLs (11 oz total) by mouth 4 (four) times daily.  06/14/17   Jacquelynn Cree, PA-C     Critical care time: 40 minutes       Zada Girt, [facility]  Pulmonary/Critical Care Pager [phone] (please enter 7 digits) PCCM Consult Pager [phone] (please  enter 7 digits)

## 2023-06-24 NOTE — Progress Notes (Signed)
 [facility] CARDIOLOGY PROGRESS NOTE   Patient ID: Edwin Jones MRN: [id number] DOB/AGE: Jun 21, 1940 83 y.o.  Admit date: 06/18/2023 Referring Physician Dr. Buren Kos Primary Physician Marvis Moeller Doralee Albino, MD  Primary Cardiologist None Reason for Consultation Elevated troponins  HPI: Edwin Jones is a 83 y.o. male with a past medical history of hypertension, type 2 diabetes mellitus, COPD, small cell lung cancer who presented to the ED on 06/18/2023 for respiratory distress. Patient was found to have elevated troponins, NSTEMI. Cardiology was consulted for further evaluation.   Interval History:  -Patient extubated yesterday, seen this AM during breathing treatment.  -Renal function slightly up today. -BP and HR remain stable.  -Per PCCM has likely recurrence of small cell lung cancer, they are planning further workup inpatient vs outpatient.  Review of systems complete and found to be negative unless listed above    Vitals:   06/24/23 0600 06/24/23 0615 06/24/23 0633 06/24/23 0800  BP: 112/68     Pulse: 81 81    Resp: (!) 21 (!) 24    Temp:    97.8 F (36.6 C)  TempSrc:    Axillary  SpO2: 96% 95%    Weight:   88.9 kg   Height:         Intake/Output Summary (Last 24 hours) at 06/24/2023 4098 Last data filed at 06/24/2023 0600 Gross per 24 hour  Intake 64.41 ml  Output 4000 ml  Net -3935.59 ml     PHYSICAL EXAM General: Chronically ill appearing elderly male, well nourished, in no acute distress. HEENT: Normocephalic and atraumatic. Neck: No JVD.  Lungs: Intubated. Mechanical breath sounds. Heart: HRRR. Normal S1 and S2 without gallops or murmurs. Radial & DP pulses 2+ bilaterally. Abdomen: Non-distended appearing.  Msk: Normal strength and tone for age. Extremities: No clubbing, cyanosis or edema.     LABS: Basic Metabolic Panel: Recent Labs    06/23/23 0457 06/24/23 0508  NA 139 141  K 4.1 4.3  CL 108 105  CO2 23 28  GLUCOSE 143* 91  BUN 38* 43*   CREATININE 1.39* 1.63*  CALCIUM 8.1* 8.7*  MG 1.9 1.9  PHOS 3.5 3.5   Liver Function Tests: No results for input(s): "AST", "ALT", "ALKPHOS", "BILITOT", "PROT", "ALBUMIN" in the last 72 hours.  No results for input(s): "LIPASE", "AMYLASE" in the last 72 hours. CBC: Recent Labs    06/23/23 0457 06/24/23 0508  WBC 12.8* 13.3*  HGB 10.2* 10.9*  HCT 31.1* 32.4*  MCV 94.5 91.5  PLT 322 357   Cardiac Enzymes: No results for input(s): "CKTOTAL", "CKMB", "CKMBINDEX", "TROPONINIHS" in the last 72 hours.  BNP: No results for input(s): "BNP" in the last 72 hours.  D-Dimer: No results for input(s): "DDIMER" in the last 72 hours. Hemoglobin A1C: No results for input(s): "HGBA1C" in the last 72 hours.  Fasting Lipid Panel: No results for input(s): "CHOL", "HDL", "LDLCALC", "TRIG", "CHOLHDL", "LDLDIRECT" in the last 72 hours. Thyroid Function Tests: No results for input(s): "TSH", "T4TOTAL", "T3FREE", "THYROIDAB" in the last 72 hours.  Invalid input(s): "FREET3" Anemia Panel: No results for input(s): "VITAMINB12", "FOLATE", "FERRITIN", "TIBC", "IRON", "RETICCTPCT" in the last 72 hours.  No results found.    ECHO 06/19/2023 1. Left ventricular ejection fraction, by estimation, is 50 to 55%. The left ventricle has low normal function. The left ventricle demonstrates regional wall motion abnormalities (see scoring diagram/findings for  description). There is mild concentric left ventricular hypertrophy. Left ventricular diastolic parameters are consistent with Grade I diastolic dysfunction (  impaired relaxation).   2. Right ventricular systolic function is normal. The right ventricular size is normal.   3. Left atrial size was mild to moderately dilated.   4. Right atrial size was mild to moderately dilated.   5. The mitral valve is normal in structure. Mild mitral valve regurgitation. No evidence of mitral stenosis.   6. The aortic valve is normal in structure. Aortic valve  regurgitation is trivial. No aortic stenosis is present.   7. The inferior vena cava is normal in size with greater than 50% respiratory variability, suggesting right atrial pressure of 3 mmHg.    TELEMETRY reviewed by me 06/24/23: sinus rhythm, rate 80s PACs PVCs  EKG reviewed by me 06/24/23: sinus rhythm, rate 82 bpm  DATA reviewed by me 06/24/23: last 24h vitals tele labs imaging I/O, hospitalist and critical care progress notes  Principal Problem:   Acute hypoxic respiratory failure (HCC)    ASSESSMENT AND PLAN: Edwin Jones is a 83 y.o. male with a past medical history of chronic HFpEF, hypertension, type 2 diabetes mellitus, COPD, small cell lung cancer who presented to the ED on 06/18/2023 for respiratory distress. Patient was found to have elevated troponins. Cardiology was consulted for further evaluation.   # NSTEMI, Type I # Acute hypoxic respiratory failure # Septic shock, Pneumonia # COPD exacerbation  # Chronic HFpEF Patient presented to the ED via EMS with respiratory distress. Patient was placed on BiPAP but became combative and his O2 desaturated and needed emergent intubation. CXR revealed left mid/lower pneumonia. IV antibiotics and fluids were started. Patient became hypotensive requiring Levophed. Trops elevated and trending 96 > 4,173 > 5,798 > 6,129 > 2,700. BNP normal 87. EKG in ED on 04/11 with sinus rhythm rate 82 bpm with no significant ST changes. Echo this admission showed EF 50-55%, regional wall motion abnormalities of LV, grade I diastolic dysfunction. Patient remains intubated and sedated. IV levophed stopped on 04/15. HR is stable and BP borderline. -Continue metoprolol tartrate 25 mg twice daily. Can consider other good medical therapy throughout recovery as BP and renal function allow.  -Aspirin 81 mg daily ordered per tube.  Patient has documented statin allergy. -No plan for further cardiac diagnostics at this time. Can consider outpatient if he has  symptoms/issues. -Per PCCM they are concerned for recurrence of small cell lung cancer and are planning for EBUS either during this admission after he recovers more vs outpatient.  -Pneumonia, acute respiratory failure managed per primary and critical care teams.   This patient's case was discussed and created with Dr. Custovic and she is in agreement.  Signed:  Hamp Levine, PA-C  06/24/2023, 9:03 AM [facility] Cardiology

## 2023-06-24 NOTE — Evaluation (Addendum)
 Clinical/Bedside Swallow Evaluation Patient Details  Name: Edwin Jones MRN: [id number] Date of Birth: 1940-12-08  Today's Date: 06/24/2023 Time: SLP Start Time (ACUTE ONLY): 1130 SLP Stop Time (ACUTE ONLY): 1200 SLP Time Calculation (min) (ACUTE ONLY): 30 min  Past Medical History:  Past Medical History:  Diagnosis Date   Cancer (HCC)    COPD (chronic obstructive pulmonary disease) (HCC)    Diabetes mellitus without complication (HCC)    GERD without esophagitis    Hepatitis C virus    history of hep C treated with Harvoni   Hypercholesterolemia    Hypertension    Seasonal allergies    Past Surgical History:  Past Surgical History:  Procedure Laterality Date   COLONOSCOPY     approximately 11 years ago   FLEXIBLE BRONCHOSCOPY N/A 11/05/2016   Procedure: FLEXIBLE BRONCHOSCOPY;  Surgeon: Merwyn Katos, MD;  Location: [facility] ORS;  Service: Pulmonary;  Laterality: N/A;   IR FLUORO GUIDE CV LINE RIGHT  05/06/2017   IR INTRAVSC STENT CERV CAROTID W/O EMB-PROT MOD SED INC ANGIO  05/06/2017   IR PERCUTANEOUS ART THROMBECTOMY/INFUSION INTRACRANIAL INC DIAG ANGIO  05/06/2017   IR US GUIDE VASC ACCESS LEFT  05/06/2017   IR US GUIDE VASC ACCESS RIGHT  05/06/2017   IR US GUIDE VASC ACCESS RIGHT  05/06/2017   PORTA CATH INSERTION N/A 11/23/2016   Procedure: Shelda Pal Cath Insertion;  Surgeon: Annice Needy, MD;  Location: [facility] INVASIVE CV LAB;  Service: Cardiovascular;  Laterality: N/A;   RADIOLOGY WITH ANESTHESIA N/A 05/06/2017   Procedure: IR WITH ANESTHESIA;  Surgeon: Radiologist, Medication, MD;  Location: [facility] OR;  Service: Radiology;  Laterality: N/A;   TEE WITHOUT CARDIOVERSION N/A 05/10/2017   Procedure: TRANSESOPHAGEAL ECHOCARDIOGRAM (TEE);  Surgeon: Thurmon Fair, MD;  Location: [facility] ENDOSCOPY;  Service: Cardiovascular;  Laterality: N/A;   TONSILLECTOMY     HPI:  Per critical care progress note, "Edwin Jones is an 83 year old African-American male with a history of type 2 diabetes,  hypertension, small cell lung cancer, and COPD who presented to the ED via EMS with complaints of respiratory distress. History is obtained from EMS records as well as ED records as patient is currently intubated and sedated and there is no family at the bedside. Per ED records, EMS was called for respiratory distress. Upon EMS arrival, patient's oxygen saturation was 60%. Bag mask ventilation was started and patient was transported to the ED after an unsuccessful intubation attempt in the field. Upon ED arrival, patient had copious amounts of secretions in his oral cavity. Initial ED workup revealed multifocal pneumonia with sepsis. Patient was placed on BiPAP however he became combative and started coughing profusely and also had large amounts of secretions that filled up the BiPAP mask. His oxygen saturation dropped and he was emergently intubated. PCCM was consulted for further management." Pt intubated 4/11-7/16. Per RN, spouse reported pt was ind with eating/drinking at baseline. Head CT 4/17: No CT evidence of acute intracranial abnormality.      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  Moderate chronic microvascular ischemic changes and parenchymal  volume loss.     Large right mastoid effusion. Recommend correlation for evidence of  mastoiditis. CXR 4/15: Patchy airspace disease in both lung bases, right greater than  left. Imaging features could be related to atelectasis or  infiltrate.    Assessment / Plan / Recommendation  Clinical Impression  Pt seen for bedside swallow assessment s/p intubation. Pt currently on 7L nasal canula  with O2 saturations maintained at 98-100 for duration of session. Pt alert (eyes open) with no verbalizations appreciated with questioning, drowsiness noted upon completion of assessment (eyes drifting shut). 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  Congested, productive, spontaneous cough noted, with provision of oral suction as needed. Oral care completed. Pt edentulous, with dentures present in the room- dentures placed but loose. Fixodent requested. Pt seen with trials of  ice chips, cup sips of thin liquids, and tsp of puree. Pt ind holding and drinking from cup with provision of extended time. Immediate cough and wet vocal quality noted with cup sips of thin liquid and puree solids. Suction provided following cough completion. Oral phase notably prolonged with ice chip manipulation and clearance- noted reduced labial seal on cup. Given overt immediate indication of aspiration, further trials not completed.   Recommend continued NPO with allowance of ice chips following oral care. NP and RN aware of recommendations. SLP will continue to follow for PO readiness.  SLP Visit Diagnosis: Dysphagia, oropharyngeal phase (R13.12)    Aspiration Risk  Moderate aspiration risk    Diet Recommendation   NPO  Medication Administration: Via alternative means    Other  Recommendations Oral Care Recommendations: Oral care BID;Oral care prior to ice chip/H20;Staff/trained caregiver to provide oral care    Recommendations for follow up therapy are one component of a multi-disciplinary discharge planning process, led by the attending physician.  Recommendations may be updated based on patient status, additional functional criteria and insurance authorization.  Follow up Recommendations Follow physician's recommendations for discharge plan and follow up therapies      Assistance Recommended at Discharge  Supervision and assist with provision of ice chips  Functional Status Assessment Patient has had a recent decline in their functional status and demonstrates the ability to make significant improvements in function in a reasonable and predictable amount of time.  Frequency and Duration min 2x/week  2 weeks       Prognosis Prognosis for improved  oropharyngeal function: Fair Barriers to Reach Goals: Cognitive deficits (? baseline)      Swallow Study   General Date of Onset: 06/24/23 HPI: Per critical care progress note, "Edwin Jones is an 83 year old African-American male with a history of type 2 diabetes, hypertension, small cell lung cancer, and COPD who presented to the ED via EMS with complaints of respiratory distress. History is obtained from EMS records as well as ED records as patient is currently intubated and sedated and there is no family at the bedside. Per ED records, EMS was called for respiratory distress. Upon EMS arrival, patient's oxygen saturation was 60%. Bag mask ventilation was started and patient was transported to the ED after an unsuccessful intubation attempt in the field. Upon ED arrival, patient had copious amounts of secretions in his oral cavity. Initial ED workup revealed multifocal pneumonia with sepsis. Patient was placed on BiPAP however he became combative and started coughing profusely and also had large amounts of secretions that filled up the BiPAP mask. His oxygen saturation dropped and he was emergently intubated. PCCM was consulted for further management." Pt intubated 4/11-7/16. Per RN, spouse reported pt was ind with eating/drinking at baseline. Head CT 4/17: No CT evidence of acute intracranial abnormality.      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  Moderate chronic microvascular ischemic changes and parenchymal  volume loss.  Large right mastoid effusion. Recommend correlation for evidence of  mastoiditis. CXR 4/15: Patchy airspace disease in both lung bases, right greater than  left. Imaging features could be related to atelectasis or  infiltrate. Type of Study: Bedside Swallow Evaluation Previous Swallow Assessment: Seen by SLP in 2019- discharged on mech soft and thin liquids diet Diet Prior to this Study: NPO Temperature Spikes Noted: Yes (100.8; WBC 13.3) Respiratory  Status: Nasal cannula (7L) History of Recent Intubation: Yes Total duration of intubation (days): 5 days Date extubated: 06/23/23 Behavior/Cognition: Lethargic/Drowsy;Cooperative Oral Cavity Assessment: Within Functional Limits Oral Care Completed by SLP: Yes Oral Cavity - Dentition: Dentures, top;Dentures, bottom Vision: Functional for self-feeding Self-Feeding Abilities: Able to feed self (with extended time) Patient Positioning: Upright in bed Baseline Vocal Quality:  (none observed) Volitional Cough: Congested (spontaneous) Volitional Swallow: Unable to elicit    Oral/Motor/Sensory Function Overall Oral Motor/Sensory Function: Generalized oral weakness (no focal deficit appreciated- hx of R sided impairment. Slowed oral motor movement)   Ice Chips Ice chips: Within functional limits Presentation: Spoon Other Comments: slow oral manipulation   Thin Liquid Thin Liquid: Impaired Oral Phase Impairments: Reduced labial seal;Poor awareness of bolus Pharyngeal  Phase Impairments: Wet Vocal Quality;Cough - Immediate    Nectar Thick Nectar Thick Liquid: Not tested   Honey Thick Honey Thick Liquid: Not tested   Puree Puree: Impaired Presentation: Spoon Oral Phase Impairments: Reduced labial seal;Impaired mastication;Poor awareness of bolus Pharyngeal Phase Impairments: Cough - Immediate;Wet Vocal Quality   Solid     Solid: Not tested     Edwin Trevelle Mcgurn Clapp, MS, CCC-SLP Speech Language Pathologist Rehab Services; [facility] [phone] (ascom)   Edwin Jones 06/24/2023,1:02 PM

## 2023-06-24 NOTE — Evaluation (Signed)
 Occupational Therapy Evaluation Patient Details Name: Edwin Jones MRN: [id number] DOB: [birth year] Today's Date: 06/24/2023   History of Present Illness   Pt is an 83 y/o M admitted on 06/18/23 after presenting to the ED with c/o respiratory distress. Pt required intubation, found to have multifocal PNA with sepsis. Pt also being treated for NSTEMI, septic shock. Pt extubated 06/23/23. PMH: DM2, HTN, small cell lung CA, COPD, Hep C, hypercholesterolemia, CVA with residual R sided deficits     Clinical Impressions Pt was seen for PT/OT co-evaluation this date to maximize pt/therapist safety. Prior to hospital admission, pt was living at home with his wife and nephew who were providing 24/7 care. Pt was mostly bed bound, but family would hoyer lift him to get OOB. Total assist for toileting/bathing tasks at bed level. Able to sit at EOB without assist and perform self feeding and grooming tasks. He was receiving HHPT who was working on standing with him.  Pt presents to acute OT demonstrating impaired ADL performance and functional mobility 2/2 weakness, low activity tolerance and balance deficits. Pt currently requires total to Max A x2 for bed mobility tasks of rolling and supine<>Sit with multi modal cues and pt attempting to initiate. Tolerated ~12 mins of seated balance with Mod A progressing to Min A for seated balance with R lateral lean noted. R sided deficits from previous CVA with R grip weaker than L and R wrist/forearm tremors noted. Pt total assist for peri-care at bed level and likely Max A for grooming tasks d/t weakness.  Pt would benefit from skilled OT services to address noted impairments and functional limitations (see below for any additional details) in order to maximize safety and independence while minimizing falls risk and caregiver burden. Do anticipate the need for follow up OT services upon acute hospital DC.       If plan is discharge home, recommend the following:    Two people to help with walking and/or transfers;Two people to help with bathing/dressing/bathroom     Functional Status Assessment   Patient has had a recent decline in their functional status and demonstrates the ability to make significant improvements in function in a reasonable and predictable amount of time.     Equipment Recommendations   None recommended by OT     Recommendations for Other Services         Precautions/Restrictions   Precautions Precautions: Fall Restrictions Weight Bearing Restrictions Per Provider Order: No     Mobility Bed Mobility Overal bed mobility: Needs Assistance Bed Mobility: Supine to Sit, Sit to Supine, Rolling Rolling: Total assist, +2 for physical assistance, Used rails   Supine to sit: Max assist, HOB elevated, Used rails Sit to supine: Max assist, HOB elevated, Used rails   General bed mobility comments: pt attempting to initiate BLE movement towards EOB; seated balance progressing from Mod to Min A for ~12-15 mins    Transfers                   General transfer comment: deferred      Balance Overall balance assessment: Needs assistance Sitting-balance support: Feet supported, Bilateral upper extremity supported Sitting balance-Leahy Scale: Poor Sitting balance - Comments: mod fade to min assist, R lateral lean, assistance to correct as pt unable with cueing Postural control: Right lateral lean  ADL either performed or assessed with clinical judgement   ADL Overall ADL's : Needs assistance/impaired                             Toileting- Clothing Manipulation and Hygiene: Total assistance;Bed level               Vision         Perception         Praxis         Pertinent Vitals/Pain Pain Assessment Pain Assessment: Faces Faces Pain Scale: No hurt Pain Intervention(s): Monitored during session     Extremity/Trunk Assessment Upper  Extremity Assessment Upper Extremity Assessment: Generalized weakness (RUE edema, initiated bil grip strength, tremors to R wrist/hand/forearm, R sided deficits from prev CVA)   Lower Extremity Assessment Lower Extremity Assessment: Generalized weakness       Communication Communication Communication: Impaired Factors Affecting Communication: Reduced clarity of speech;Difficulty expressing self   Cognition Arousal: Alert Behavior During Therapy: Flat affect                                 Following commands: Impaired Following commands impaired: Follows one step commands inconsistently, Follows one step commands with increased time     Cueing  General Comments   Cueing Techniques: Verbal cues;Gestural cues;Tactile cues;Visual cues  5L HFNC with 87% lowest sp02 reading and HR max 132 bpm   Exercises Other Exercises Other Exercises: Edu on role of OT in acute setting and DC recommendations.   Shoulder Instructions      Home Living Family/patient expects to be discharged to:: Private residence Living Arrangements: Spouse/significant other Available Help at Discharge: Family;Available 24 hours/day Type of Home: House       Home Layout: One level               Home Equipment: Other (comment);Wheelchair - manual (bed with adjustable frame, hoyer lift)   Additional Comments: Working with HHPT on standing      Prior Functioning/Environment Prior Level of Function : Needs assist  Cognitive Assist : ADLs (cognitive);Mobility (cognitive)     Physical Assist : ADLs (physical);Mobility (physical)     Mobility Comments: Pt was able to transition supine<>sit without assistance, sat EOB for meals without assistance, utilized hoyer lift to transfer OOB to w/c. ADLs Comments: Wife provided assistance for peri hygiene & bathing at bed level    OT Problem List: Decreased strength;Decreased activity tolerance;Impaired balance (sitting and/or  standing);Decreased cognition   OT Treatment/Interventions: Self-care/ADL training;Balance training;Therapeutic exercise;Therapeutic activities;Patient/family education      OT Goals(Current goals can be found in the care plan section)   Acute Rehab OT Goals Patient Stated Goal: improve strength/balance OT Goal Formulation: With patient/family Time For Goal Achievement: 07/08/23 Potential to Achieve Goals: Fair ADL Goals Pt Will Perform Grooming: sitting;with supervision Additional ADL Goal #1: Pt will tolerate sitting at EOB x20 mins to prepare for engagement in ADL session with CGA for balance.   OT Frequency:  Min 2X/week    Co-evaluation PT/OT/SLP Co-Evaluation/Treatment: Yes Reason for Co-Treatment: Complexity of the patient's impairments (multi-system involvement);Necessary to address cognition/behavior during functional activity;For patient/therapist safety PT goals addressed during session: Balance;Mobility/safety with mobility OT goals addressed during session: Strengthening/ROM      AM-PAC OT "6 Clicks" Daily Activity     Outcome Measure Help from another person eating meals?: Total (NPO) Help  from another person taking care of personal grooming?: Total Help from another person toileting, which includes using toliet, bedpan, or urinal?: Total Help from another person bathing (including washing, rinsing, drying)?: Total Help from another person to put on and taking off regular upper body clothing?: Total Help from another person to put on and taking off regular lower body clothing?: Total 6 Click Score: 6   End of Session Equipment Utilized During Treatment: Oxygen Nurse Communication: Mobility status  Activity Tolerance: Patient tolerated treatment well;Patient limited by fatigue Patient left: in bed;with call bell/phone within reach;with bed alarm set;with family/visitor present  OT Visit Diagnosis: Other abnormalities of gait and mobility (R26.89);Muscle  weakness (generalized) (M62.81);Hemiplegia and hemiparesis Hemiplegia - Right/Left: Right Hemiplegia - dominant/non-dominant: Dominant Hemiplegia - caused by: Cerebral infarction                Time: 3086-5784 OT Time Calculation (min): 29 min Charges:  OT General Charges $OT Visit: 1 Visit OT Evaluation $OT Eval Moderate Complexity: 1 Mod  Brittannie Tawney, OTR/L 06/24/23, 3:42 PM  Braison Snoke E Daiton Cowles 06/24/2023, 3:37 PM

## 2023-06-24 NOTE — Progress Notes (Signed)
 PHARMACY CONSULT NOTE  Pharmacy Consult for Electrolyte Monitoring and Replacement   Recent Labs: Potassium (mmol/L)  Date Value  06/24/2023 4.3   Magnesium (mg/dL)  Date Value  16/12/9602 1.9   Calcium (mg/dL)  Date Value  54/11/8117 8.7 (L)   Albumin (g/dL)  Date Value  14/78/2956 2.0 (L)   Phosphorus (mg/dL)  Date Value  21/30/8657 3.5   Sodium (mmol/L)  Date Value  06/24/2023 141   Assessment: 83 y.o. male with history of COPD prior small cell lung cancer, DM 2, HTN.  Pharmacy is asked to follow and replace electrolytes while in CCU  Goal of Therapy:  Electrolytes WNL  Plan:  --No electrolyte replacement indicated at this time --Sign off on electrolyte consult given stability  Page Boast 06/24/2023 7:20 AM

## 2023-06-24 NOTE — Telephone Encounter (Signed)
 Per secure chat from Dr. Lucina Sabal, the patient and his wife are in the ICU and he will talk to them about the Chest CT in person.  Nothing further needed.

## 2023-06-24 NOTE — Telephone Encounter (Signed)
 Copied from CRM [id number]. Topic: Appointments - Scheduling Inquiry for Clinic >> Jun 24, 2023 10:52 AM Roseanne Cones wrote: Reason for CRM: Patient is admitted to [facility] ICU/CCU and spouse would like to know if the CT of his Chest can be done today as well. He is currently scheduled for a CT of his Head this morning at 11:15 am. Please call spouse when you have a moment - Spouse mentioned that Dr. Lucina Sabal is the attending and she would like to see if we can make this happen at the same time.

## 2023-06-24 NOTE — Progress Notes (Signed)
 Nutrition Follow Up Note   DOCUMENTATION CODES:   Not applicable  INTERVENTION:   RD will add supplements with diet advancement   MVI po daily with diet advancement   Pt remains at high refeed risk; recommend monitor potassium, magnesium and phosphorus labs daily until stable  Daily weights   NUTRITION DIAGNOSIS:   Inadequate oral intake related to inability to eat as evidenced by NPO status. 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  Pt extubated 4/16. Pt seen by SLP and remains NPO. RD will add supplements with diet advancement. Per chart, pt appears weight stable since admission.    Medications reviewed and include: aspirin, lovenox, insulin, miralax, thiamine  Labs reviewed: K 4.3 wnl, BUN 43(H), creat 1.63(H), P 3.5 wnl, Mg 1.9 wnl Wbc- 13.3(H), Hgb 10.9(L), Hct 32.4(L) Cbgs- 100, 80, 101 x 24 hrs   UOP-   Diet Order:   Diet Order             Diet NPO time specified  Diet effective now                  EDUCATION NEEDS:   No education needs have been identified at this time  Skin:  Skin Assessment: Reviewed RN Assessment  Last BM:  4/17- type 6  Height:   Ht Readings from Last 1 Encounters:  06/22/23 6\' 3"  (1.905 m)    Weight:   Wt Readings from Last 1 Encounters:  06/24/23 88.9 kg    Ideal Body Weight:  89.1 kg  BMI:  Body mass index is 24.5 kg/m.  Estimated Nutritional Needs:   Kcal:  2200-2500kcal/day  Protein:  110-125g/day  Fluid:  2.4-2.7L/day  Torrance Freestone MS, RD, LDN If unable to be reached, please send secure chat to "RD  inpatient" available from 8:00a-4:00p daily

## 2023-06-25 DIAGNOSIS — Z515 Encounter for palliative care: Secondary | ICD-10-CM

## 2023-06-25 DIAGNOSIS — J9601 Acute respiratory failure with hypoxia: Secondary | ICD-10-CM | POA: Diagnosis not present

## 2023-06-25 DIAGNOSIS — J189 Pneumonia, unspecified organism: Secondary | ICD-10-CM | POA: Diagnosis not present

## 2023-06-25 DIAGNOSIS — A419 Sepsis, unspecified organism: Secondary | ICD-10-CM | POA: Diagnosis not present

## 2023-06-25 LAB — CBC
HCT: 33.2 % — ABNORMAL LOW (ref 39.0–52.0)
Hemoglobin: 11 g/dL — ABNORMAL LOW (ref 13.0–17.0)
MCH: 31.3 pg (ref 26.0–34.0)
MCHC: 33.1 g/dL (ref 30.0–36.0)
MCV: 94.3 fL (ref 80.0–100.0)
Platelets: 344 10*3/uL (ref 150–400)
RBC: 3.52 MIL/uL — ABNORMAL LOW (ref 4.22–5.81)
RDW: 15.8 % — ABNORMAL HIGH (ref 11.5–15.5)
WBC: 11.8 10*3/uL — ABNORMAL HIGH (ref 4.0–10.5)
nRBC: 0 % (ref 0.0–0.2)

## 2023-06-25 LAB — BASIC METABOLIC PANEL WITH GFR
Anion gap: 13 (ref 5–15)
BUN: 41 mg/dL — ABNORMAL HIGH (ref 8–23)
CO2: 25 mmol/L (ref 22–32)
Calcium: 8.6 mg/dL — ABNORMAL LOW (ref 8.9–10.3)
Chloride: 103 mmol/L (ref 98–111)
Creatinine, Ser: 1.4 mg/dL — ABNORMAL HIGH (ref 0.61–1.24)
GFR, Estimated: 50 mL/min — ABNORMAL LOW (ref >60)
Glucose, Bld: 116 mg/dL — ABNORMAL HIGH (ref 70–99)
Potassium: 4.2 mmol/L (ref 3.5–5.1)
Sodium: 141 mmol/L (ref 135–145)

## 2023-06-25 LAB — GLUCOSE, CAPILLARY
Glucose-Capillary: 104 mg/dL — ABNORMAL HIGH (ref 70–99)
Glucose-Capillary: 121 mg/dL — ABNORMAL HIGH (ref 70–99)
Glucose-Capillary: 112 mg/dL — ABNORMAL HIGH (ref 70–99)
Glucose-Capillary: 135 mg/dL — ABNORMAL HIGH (ref 70–99)
Glucose-Capillary: 146 mg/dL — ABNORMAL HIGH (ref 70–99)
Glucose-Capillary: 141 mg/dL — ABNORMAL HIGH (ref 70–99)

## 2023-06-25 LAB — PHOSPHORUS: Phosphorus: 3.6 mg/dL (ref 2.5–4.6)

## 2023-06-25 LAB — MAGNESIUM: Magnesium: 2.1 mg/dL (ref 1.7–2.4)

## 2023-06-25 MED ORDER — POLYVINYL ALCOHOL 1.4 % OP SOLN
2.0000 [drp] | OPHTHALMIC | Status: DC | PRN
  Filled 2023-06-25: qty 15

## 2023-06-25 NOTE — Progress Notes (Signed)
 [facility] CLINIC CARDIOLOGY PROGRESS NOTE   Patient ID: Edwin Jones MRN: [id number] DOB/AGE: 1940-06-15 83 y.o.  Admit date: 06/18/2023 Referring Physician Dr. Curley Double Primary Physician Willene Harper Georgeann Kindred, MD  Primary Cardiologist None Reason for Consultation Elevated troponins  HPI: Edwin Jones is a 83 y.o. male with a past medical history of hypertension, type 2 diabetes mellitus, COPD, small cell lung cancer who presented to the ED on 06/18/2023 for respiratory distress. Patient was found to have elevated troponins, NSTEMI. Cardiology was consulted for further evaluation.   Interval History:  Patient seen and examined at bedside, appears relatively comfortable. He is not really responding to questions. VSS.  Review of systems complete and found to be negative unless listed above    Vitals:   06/25/23 0500 06/25/23 0629 06/25/23 0735 06/25/23 0756  BP:  139/89  129/72  Pulse:  86    Resp:  20  16  Temp:  97.7 F (36.5 C)  97.9 F (36.6 C)  TempSrc:  Oral    SpO2:  93% 94% 100%  Weight: 88.1 kg     Height:         Intake/Output Summary (Last 24 hours) at 06/25/2023 1008 Last data filed at 06/25/2023 0500 Gross per 24 hour  Intake --  Output 1000 ml  Net -1000 ml     PHYSICAL EXAM General: Chronically ill appearing elderly male, well nourished, in no acute distress. HEENT: Normocephalic and atraumatic. Neck: No JVD.  Lungs: Intubated. Mechanical breath sounds. Heart: HRRR. Normal S1 and S2 without gallops or murmurs. Radial & DP pulses 2+ bilaterally. Abdomen: Non-distended appearing.  Msk: Normal strength and tone for age. Extremities: No clubbing, cyanosis or edema.     LABS: Basic Metabolic Panel: Recent Labs    06/24/23 0508 06/25/23 0525  NA 141 141  K 4.3 4.2  CL 105 103  CO2 28 25  GLUCOSE 91 116*  BUN 43* 41*  CREATININE 1.63* 1.40*  CALCIUM 8.7* 8.6*  MG 1.9 2.1  PHOS 3.5 3.6   Liver Function Tests: No results for input(s): "AST", "ALT",  "ALKPHOS", "BILITOT", "PROT", "ALBUMIN " in the last 72 hours.  No results for input(s): "LIPASE", "AMYLASE" in the last 72 hours. CBC: Recent Labs    06/24/23 0508 06/25/23 0525  WBC 13.3* 11.8*  HGB 10.9* 11.0*  HCT 32.4* 33.2*  MCV 91.5 94.3  PLT 357 344   Cardiac Enzymes: No results for input(s): "CKTOTAL", "CKMB", "CKMBINDEX", "TROPONINIHS" in the last 72 hours.  BNP: No results for input(s): "BNP" in the last 72 hours.  D-Dimer: No results for input(s): "DDIMER" in the last 72 hours. Hemoglobin A1C: No results for input(s): "HGBA1C" in the last 72 hours.  Fasting Lipid Panel: No results for input(s): "CHOL", "HDL", "LDLCALC", "TRIG", "CHOLHDL", "LDLDIRECT" in the last 72 hours. Thyroid Function Tests: No results for input(s): "TSH", "T4TOTAL", "T3FREE", "THYROIDAB" in the last 72 hours.  Invalid input(s): "FREET3" Anemia Panel: No results for input(s): "VITAMINB12", "FOLATE", "FERRITIN", "TIBC", "IRON", "RETICCTPCT" in the last 72 hours.  CT CHEST WO CONTRAST Result Date: 06/24/2023 CLINICAL DATA:  History of non-small-cell lung cancer. Acute hypoxic respiratory failure. * Tracking Code: BO * EXAM: CT CHEST WITHOUT CONTRAST TECHNIQUE: Multidetector CT imaging of the chest was performed following the standard protocol without IV contrast. RADIATION DOSE REDUCTION: This exam was performed according to the departmental dose-optimization program which includes automated exposure control, adjustment of the mA and/or kV according to patient size and/or use of iterative reconstruction technique. COMPARISON:  Chest radiograph dated 06/22/2023, CT chest dated 12/05/2022 FINDINGS: Cardiovascular: Right chest wall port terminates at the lower SVC. Normal heart size. No significant pericardial fluid/thickening. Great vessels are normal in course and caliber. Coronary artery calcifications and aortic atherosclerosis. Mediastinum/Nodes: Imaged thyroid gland without nodules meeting criteria  for imaging follow-up by size. Normal esophagus. 10 mm right paratracheal lymph node (2:72), unchanged. New 16 mm right hilar lymph node (2:86) and increased size of 11 mm subcarinal (2:91) lymph node, previously 6 mm. Lungs/Pleura: The central airways are patent. Biapical paraseptal emphysema. Trace secretions within the trachea. Bilateral lower lobe subsegmental atelectasis. Irregular left upper lobe perihilar masslike consolidation with associated traction bronchiectasis, which may reflect posttreatment changes. Diffuse bilateral peribronchovascular consolidation and ground-glass opacities. No pneumothorax. Small bilateral pleural effusions. Upper abdomen: Normal. Musculoskeletal: No acute or abnormal lytic or blastic osseous lesions. Partially imaged subcutaneous soft tissue stranding of the right distal arm near the elbow. IMPRESSION: 1. Diffuse bilateral peribronchovascular consolidation and ground-glass opacities, which may reflect multifocal pneumonia or pulmonary edema. 2. Small bilateral pleural effusions. 3. Irregular left upper lobe perihilar masslike consolidation with associated traction bronchiectasis, which may reflect posttreatment changes. 4. New 16 mm right hilar lymph node and increased size of 11 mm subcarinal lymph node, which may be reactive or metastatic. Recommend attention on follow-up. 5. Partially imaged subcutaneous soft tissue stranding of the right distal arm near the elbow, which may reflect cellulitis. Recommend correlation with physical exam. 6. Aortic Atherosclerosis (ICD10-I70.0) and Emphysema (ICD10-J43.9). Coronary artery calcifications. Assessment for potential risk factor modification, dietary therapy or pharmacologic therapy may be warranted, if clinically indicated. Electronically Signed   By: Limin  Xu M.D.   On: 06/24/2023 18:26   CT HEAD WO CONTRAST ( ) Result Date: 06/24/2023 CLINICAL DATA:  Mental status change. EXAM: CT HEAD WITHOUT CONTRAST TECHNIQUE: Contiguous  axial images were obtained from the base of the skull through the vertex without intravenous contrast. RADIATION DOSE REDUCTION: This exam was performed according to the departmental dose-optimization program which includes automated exposure control, adjustment of the mA and/or kV according to patient size and/or use of iterative reconstruction technique. COMPARISON:  MRI head 05/04/2017. FINDINGS: Brain: No acute intracranial hemorrhage. No CT evidence of acute infarct. Nonspecific hypoattenuation in the periventricular and subcortical white matter favored to reflect chronic microvascular ischemic changes. 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 No edema, mass effect, or midline shift. The basilar cisterns are patent. Ventricles: Prominence of the ventricles suggesting underlying parenchymal volume loss. Vascular: Atherosclerotic calcifications of the carotid siphons. No hyperdense vessel. Skull: No acute or aggressive finding. Orbits: Orbits are symmetric. Sinuses: Mild mucosal thickening in the left sphenoid sinus. No air-fluid levels. Other: Large right mastoid effusion. IMPRESSION: No CT evidence of acute intracranial abnormality. 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 Moderate chronic microvascular ischemic changes and parenchymal volume loss. Large right mastoid effusion. Recommend correlation for evidence of mastoiditis. Electronically Signed   By: Denny Flack M.D.   On: 06/24/2023 10:03      ECHO 06/19/2023 1. Left ventricular ejection fraction, by estimation, is 50 to 55%. The left ventricle has low normal function. The left ventricle demonstrates regional wall motion abnormalities (see scoring diagram/findings for  description). There is mild concentric left ventricular hypertrophy. Left ventricular diastolic parameters are consistent with Grade I diastolic dysfunction (impaired relaxation).   2. Right ventricular systolic function is  normal. The right ventricular size is normal.   3. Left atrial size  was mild to moderately dilated.   4. Right atrial size was mild to moderately dilated.   5. The mitral valve is normal in structure. Mild mitral valve regurgitation. No evidence of mitral stenosis.   6. The aortic valve is normal in structure. Aortic valve regurgitation is trivial. No aortic stenosis is present.   7. The inferior vena cava is normal in size with greater than 50% respiratory variability, suggesting right atrial pressure of 3 mmHg.    TELEMETRY reviewed by me 06/25/23: sinus rhythm, rate 80s PACs PVCs  EKG reviewed by me 06/25/23: sinus rhythm, rate 82 bpm  DATA reviewed by me 06/25/23: last 24h vitals tele labs imaging I/O, hospitalist and critical care progress notes  Principal Problem:   Acute hypoxic respiratory failure (HCC)    ASSESSMENT AND PLAN: Edwin Jones is a 83 y.o. male with a past medical history of chronic HFpEF, hypertension, type 2 diabetes mellitus, COPD, small cell lung cancer who presented to the ED on 06/18/2023 for respiratory distress. Patient was found to have elevated troponins. Cardiology was consulted for further evaluation.   # NSTEMI, Type I # Acute hypoxic respiratory failure # Septic shock, Pneumonia # COPD exacerbation  # Chronic HFpEF Patient presented to the ED via EMS with respiratory distress. Patient was placed on BiPAP but became combative and his O2 desaturated and needed emergent intubation. CXR revealed left mid/lower pneumonia. IV antibiotics and fluids were started. Patient became hypotensive requiring Levophed . Trops elevated and trending 96 > 4,173 > 5,798 > 6,129 > 2,700. BNP normal 87. EKG in ED on 04/11 with sinus rhythm rate 82 bpm with no significant ST changes. Echo this admission showed EF 50-55%, regional wall motion abnormalities of LV, grade I diastolic dysfunction. Patient remains intubated and sedated. IV levophed  stopped on 04/15. HR is stable and BP  borderline. -Continue metoprolol  tartrate 25 mg twice daily. Can consider other good medical therapy throughout recovery as BP and renal function allow.  -Aspirin  81 mg daily ordered per tube.  Patient has documented statin allergy. -No plan for further cardiac diagnostics at this time. Can consider outpatient if he has symptoms/issues. -Per PCCM they are concerned for recurrence of small cell lung cancer and are planning for EBUS either during this admission after he recovers more vs outpatient.  -Pneumonia, acute respiratory failure managed per primary and critical care teams.  - Cardiology will sing off at this time. Please reach out with any questions or concerns.    Signed:  Lanasia Porras, DO  06/25/2023, 10:08 AM [facility]

## 2023-06-25 NOTE — Progress Notes (Signed)
 Occupational Therapy Treatment Patient Details Name: Edwin Jones MRN: [id number] DOB: 02-26-1941 Today's Date: 06/25/2023   History of present illness Pt is an 83 y/o M admitted on 06/18/23 after presenting to the ED with c/o respiratory distress. Pt required intubation, found to have multifocal PNA with sepsis. Pt also being treated for NSTEMI, septic shock. Pt extubated 06/23/23. PMH: DM2, HTN, small cell lung CA, COPD, Hep C, hypercholesterolemia, CVA with residual R sided deficits   OT comments  Pt is supine in bed on arrival. Eyes open with wife and daughter present. PT/OT co-tx session performed for hoyer lift transfer to recliner for pulmonary toilteting. Pt grimacing in pain with movement RLE once in recliner. Purewick malfunction requiring full linen change, total assist x2 for rolling in bed to place pads to prepare for transfer and x2 assist for hoyer to recliner. No lean noted while up in recliner. Ensured comfort and edu nurse tech on proper use of hoyer lift to return to bed later. Pt aiming to sit up about an hour. PROM provided to RUE to promote edema management, ROM. X5 R hand grip squeezes performed, remains very weak. Pt in recliner with all needs in place and will cont to require skilled acute OT services to maximize his safety and IND to return to PLOF.       If plan is discharge home, recommend the following:  Two people to help with walking and/or transfers;Two people to help with bathing/dressing/bathroom   Equipment Recommendations  None recommended by OT    Recommendations for Other Services      Precautions / Restrictions Precautions Precautions: Fall Restrictions Weight Bearing Restrictions Per Provider Order: No       Mobility Bed Mobility Overal bed mobility: Needs Assistance Bed Mobility: Rolling Rolling: Total assist, +2 for physical assistance, Used rails         General bed mobility comments: purewick malfunction needing full linen change and  peri-care; placement of hoyer pad to prepare for transfer to recliner; able to maintain sidelying holding bed rail    Transfers                   General transfer comment: hoyer lift to recliner for pulmonary toileting.     Balance Overall balance assessment: Needs assistance Sitting-balance support: Feet supported, Bilateral upper extremity supported Sitting balance-Leahy Scale: Fair Sitting balance - Comments: no lateral lean in recliner; able to sit upright with feet on floor and back supported                                   ADL either performed or assessed with clinical judgement   ADL Overall ADL's : Needs assistance/impaired                     Lower Body Dressing: Total assistance;Bed level Lower Body Dressing Details (indicate cue type and reason): to don socks     Toileting- Clothing Manipulation and Hygiene: Total assistance;Bed level              Extremity/Trunk Assessment              Vision       Perception     Praxis     Communication Communication Communication: Impaired Factors Affecting Communication: Reduced clarity of speech;Difficulty expressing self   Cognition Arousal: Alert Behavior During Therapy: Flat affect  Following commands: Intact Following commands impaired: Follows one step commands with increased time      Cueing   Cueing Techniques: Verbal cues, Gestural cues, Tactile cues  Exercises Other Exercises Other Exercises: PROM provided to RUE at shoulder, wrist and grip squeezes to prevent further edema and maximize ROM    Shoulder Instructions       General Comments      Pertinent Vitals/ Pain       Pain Assessment Pain Assessment: Faces Faces Pain Scale: Hurts a little bit Pain Location: when moving RLE to place on pillow Pain Descriptors / Indicators: Sore Pain Intervention(s): Monitored during session, Limited activity within  patient's tolerance  Home Living                                          Prior Functioning/Environment              Frequency  Min 2X/week        Progress Toward Goals  OT Goals(current goals can now be found in the care plan section)  Progress towards OT goals: Progressing toward goals  Acute Rehab OT Goals Patient Stated Goal: improve strength/balance, activity tolerance OT Goal Formulation: With patient/family Time For Goal Achievement: 07/08/23 Potential to Achieve Goals: Fair  Plan      Co-evaluation    PT/OT/SLP Co-Evaluation/Treatment: Yes Reason for Co-Treatment: Complexity of the patient's impairments (multi-system involvement);Necessary to address cognition/behavior during functional activity;For patient/therapist safety PT goals addressed during session: Balance;Mobility/safety with mobility OT goals addressed during session: ADL's and self-care      AM-PAC OT 6 Clicks Daily Activity     Outcome Measure   Help from another person eating meals?: Total Help from another person taking care of personal grooming?: Total Help from another person toileting, which includes using toliet, bedpan, or urinal?: Total Help from another person bathing (including washing, rinsing, drying)?: Total Help from another person to put on and taking off regular upper body clothing?: Total Help from another person to put on and taking off regular lower body clothing?: Total 6 Click Score: 6    End of Session Equipment Utilized During Treatment: Oxygen  OT Visit Diagnosis: Other abnormalities of gait and mobility (R26.89);Muscle weakness (generalized) (M62.81);Hemiplegia and hemiparesis Hemiplegia - Right/Left: Right Hemiplegia - dominant/non-dominant: Dominant Hemiplegia - caused by: Cerebral infarction   Activity Tolerance Patient tolerated treatment well   Patient Left with call bell/phone within reach;with family/visitor present;in chair;with  chair alarm set   Nurse Communication Mobility status        Time: 1421-1500 OT Time Calculation (min): 39 min  Charges: OT General Charges $OT Visit: 1 Visit OT Treatments $Therapeutic Activity: 23-37 mins  Edwin Jones, OTR/L  06/25/23, 3:32 PM   Momin Misko E Chika Cichowski 06/25/2023, 3:29 PM

## 2023-06-25 NOTE — TOC Initial Note (Signed)
 Transition of Care [facility]) - Initial/Assessment Note    Patient Details  Name: Edwin Jones MRN: [id number] Date of Birth: 1941-01-10  [facility]) CM/SW Contact:    Loman Risk, RN Phone Number: 06/25/2023, 4:23 PM  Clinical Narrative:                    This TOC member received this patient from ICU today, patient will require SNF work up if family in agreement      Patient Goals and CMS Choice            Expected Discharge Plan and Services                                              Prior Living Arrangements/Services                       Activities of Daily Living      Permission Sought/Granted                  Emotional Assessment              Admission diagnosis:  Multifocal pneumonia [J18.9] Sepsis, due to unspecified organism, unspecified whether acute organ dysfunction present (HCC) [A41.9] Acute hypoxic respiratory failure (HCC) [J96.01] Patient Active Problem List   Diagnosis Date Noted   Acute hypoxic respiratory failure (HCC) 06/18/2023   Shortness of breath 06/16/2023   Carotid stenosis 07/26/2022   PAD (peripheral artery disease) (HCC) 07/23/2022   Paroxysmal A-fib (HCC) 01/14/2018   SOB (shortness of breath) on exertion    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)    Aphasia as late effect of stroke    Small cell lung cancer, left upper lobe (HCC)    Nontraumatic acute hemorrhage of basal ganglia (HCC) 05/17/2017   Anterior cerebral circulation hemorrhagic infarction (HCC) 05/17/2017   Pneumonia    Cerebral edema (HCC)    Cough    Fever, unspecified    Orthostatic hypotension    Labile blood pressure    Acute blood loss anemia    Diabetes mellitus type 2 in nonobese (HCC)    Labile blood glucose    CKD (chronic kidney disease) 05/11/2017   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   Idiopathic hypotension 05/09/2017   Stroke (HCC) 05/06/2017   Stroke  (cerebrum) (HCC) 05/06/2017   Acute ischemic left MCA stroke (HCC) 05/06/2017   Cancer of upper lobe of left lung (HCC) 10/30/2016   Pure hypercholesterolemia 06/22/2014   GERD without esophagitis 06/22/2014   Benign essential hypertension 06/22/2014   Diabetes mellitus without complication (HCC) 06/22/2014   Benign non-nodular prostatic hyperplasia with lower urinary tract symptoms 06/22/2014   PCP:  Macie Saxon, MD Pharmacy:   [facility] [address] [place] - [address] [place] [address] Phone: [phone] Fax: [phone]  [facility] [place], [place] - [address] [place] [address] Phone: [phone] Fax: [phone]  [facility] [place], [place] - [address] [place] [address] Phone: [phone] Fax: [phone]     Social Drivers of Health (SDOH) Social History: SDOH Screenings   Food Insecurity: Patient Unable To Answer (06/19/2023)  Housing: Patient Unable To Answer (06/19/2023)  Transportation Needs: Patient Unable  To Answer (06/19/2023)  Utilities: Patient Unable To Answer (06/19/2023)  Social Connections: Patient Unable To Answer (06/19/2023)  Tobacco Use: Medium Risk (06/18/2023)   SDOH Interventions:     Readmission Risk Interventions     No data to display

## 2023-06-25 NOTE — Plan of Care (Signed)
  Problem: Education: Goal: Ability to describe self-care measures that may prevent or decrease complications (Diabetes Survival Skills Education) will improve Outcome: Progressing   Problem: Coping: Goal: Ability to adjust to condition or change in health will improve Outcome: Progressing   Problem: Metabolic: Goal: Ability to maintain appropriate glucose levels will improve Outcome: Progressing   Problem: Skin Integrity: Goal: Risk for impaired skin integrity will decrease Outcome: Progressing   Problem: Tissue Perfusion: Goal: Adequacy of tissue perfusion will improve Outcome: Progressing   Problem: Health Behavior/Discharge Planning: Goal: Ability to manage health-related needs will improve Outcome: Progressing   Problem: Clinical Measurements: Goal: Will remain free from infection Outcome: Progressing Goal: Diagnostic test results will improve Outcome: Progressing Goal: Respiratory complications will improve Outcome: Progressing Goal: Cardiovascular complication will be avoided Outcome: Progressing   Problem: Activity: Goal: Risk for activity intolerance will decrease Outcome: Progressing   Problem: Coping: Goal: Level of anxiety will decrease Outcome: Progressing   Problem: Elimination: Goal: Will not experience complications related to bowel motility Outcome: Progressing Goal: Will not experience complications related to urinary retention Outcome: Progressing   Problem: Pain Managment: Goal: General experience of comfort will improve and/or be controlled Outcome: Progressing   Problem: Safety: Goal: Ability to remain free from injury will improve Outcome: Progressing   Problem: Skin Integrity: Goal: Risk for impaired skin integrity will decrease Outcome: Progressing

## 2023-06-25 NOTE — Progress Notes (Signed)
 Patient transferred from ICU,pt alert non verbal,follows simple commands.Patient on 6 liters HFNC,2 person skin checked completed,vital signs done.Patient repositioned safety measures maintained

## 2023-06-25 NOTE — Progress Notes (Signed)
 PROGRESS NOTE    Edwin Jones  ZOX:[id number] DOB: 07/17/1940 DOA: 06/18/2023 PCP: Macie Saxon, MD    Brief Narrative:  83 year old male patient with a past medical history of limited small cell lung cancer diagnosed in 2018 status post chemoradiation in remission with recent suspicion for recurrence was 4 or FDG avid on PET scan done in late 2024 presenting to [facility] on 0/11 with acute hypoxic respiratory failure secondary to community-acquired pneumonia complicated by septic shock.   4/18: Care transferred to [facility]   Assessment & Plan:   Principal Problem:   Acute hypoxic respiratory failure (HCC)   #Acute respiratory failure  #Pneumonia #Aspiration risk # Septic shock, resolved Patient required endotracheal intubation.  Was successfully extubated on 4/17.  Remained stable on 4 to 6 L nasal cannula.  Care transferred to TRH service. Plan: Complete course of antibiotics Monitor vitals and fever curve Aggressive pulmonary hygiene RD following closely Wean oxygen as tolerated  #Acute metabolic encephalopathy  In the setting of septic shock.  Mental status remains poor.  Inability to tolerate p.o. intake or p.o. medications. Plan: Delirium precautions N.p.o. pending speech reevaluation If unable to tolerate may need NGT for tube feeding   #NSTEMI  Echo 06/19/23: EF 50 to 55%; grade I diastolic dysfunction; mild mitral valve regurgitation; trivial aortic valve regurgitation  High-sensitivity troponin peaked at 6200.  Completed 72 hours of heparin  GTT.  No shock physiology noted.  Cardiology not planning any inpatient intervention.   #Acute kidney injury likely secondary to ATN #Hypomagnesia  Creatinine improving.  Suspect ATN in the setting of respiratory failure and acute illness Daily labs Avoid nephrotoxins Optimize electrolytes  #Type II diabetes mellitus  - CBG's q4hrs  - SSI  - Long acting insulin  discontinued for now  - Target CBG range 140 to 180 - Follow  hypo/hyperglycemic protocol   # History of small cell lung cancer status post chemoradiation in 2018 in remission now with suspicion of recurrence.  CT chest with concern for cancer recurrence.  Case discussed with pulmonology who discussed with patient's family.  Patient will likely need repeat diagnostic workup which will involve a bronchoscopy with likely EBUS.  Pulmonology to coordinate for timing.  DVT prophylaxis: SQ Lovenox  Code Status: Full Family Communication: Daughter and spouse at bedside Disposition Plan: Status is: Inpatient Remains inpatient appropriate because: Multiple acute issues as above   Level of care: Telemetry Medical  Consultants:  Cardiology, signed off Palliative care  Procedures:  Endotracheal intubation  Antimicrobials:   Subjective: Seen and examined.  Patient very weak and fatigued.  Unable to participate in history.  Objective: Vitals:   06/25/23 0629 06/25/23 0735 06/25/23 0756 06/25/23 1338  BP: 139/89  129/72   Pulse: 86     Resp: 20  16   Temp: 97.7 F (36.5 C)  97.9 F (36.6 C)   TempSrc: Oral     SpO2: 93% 94% 100% 100%  Weight:      Height:        Intake/Output Summary (Last 24 hours) at 06/25/2023 1411 Last data filed at 06/25/2023 0500 Gross per 24 hour  Intake --  Output 1000 ml  Net -1000 ml   Filed Weights   06/23/23 0430 06/24/23 0633 06/25/23 0500  Weight: 93.9 kg 88.9 kg 88.1 kg    Examination:  General exam: Ill-appearing, fatigued Respiratory system: Coarse breath sounds bilaterally.  Normal work of breathing.  4 L Cardiovascular system: S1-2, RRR, no murmurs, no pedal edema Gastrointestinal system: Soft,  NT/ND, normal bowel sounds Central nervous system: Alert.  Unable to assess orientation Extremities: Markedly decreased power bilaterally Skin: No rashes, lesions or ulcers Psychiatry: Unable to assess    Data Reviewed: I have personally reviewed following labs and imaging studies  CBC: Recent Labs   Lab 06/18/23 1717 06/19/23 0452 06/21/23 0450 06/22/23 0452 06/23/23 0457 06/24/23 0508 06/25/23 0525  WBC 19.2*   < > 13.4* 10.9* 12.8* 13.3* 11.8*  NEUTROABS 15.0*  --   --   --   --   --   --   HGB 12.8*   < > 9.9* 9.8* 10.2* 10.9* 11.0*  HCT 40.1   < > 29.9* 29.1* 31.1* 32.4* 33.2*  MCV 97.3   < > 94.3 92.1 94.5 91.5 94.3  PLT 426*   < > 306 302 322 357 344   < > = values in this interval not displayed.   Basic Metabolic Panel: Recent Labs  Lab 06/21/23 0450 06/22/23 0452 06/23/23 0457 06/24/23 0508 06/25/23 0525  NA 136 138 139 141 141  K 3.7 4.2 4.1 4.3 4.2  CL 106 109 108 105 103  CO2 23 23 23 28 25   GLUCOSE 247* 207* 143* 91 116*  BUN 31* 33* 38* 43* 41*  CREATININE 1.61* 1.65* 1.39* 1.63* 1.40*  CALCIUM 7.8* 8.0* 8.1* 8.7* 8.6*  MG 2.0 1.9 1.9 1.9 2.1  PHOS 1.8* 2.1* 3.5 3.5 3.6   GFR: Estimated Creatinine Clearance: 47.8 mL/min (A) (by C-G formula based on SCr of 1.4 mg/dL (H)). Liver Function Tests: Recent Labs  Lab 06/18/23 1717 06/20/23 0248 06/21/23 0450  AST 20  --   --   ALT 10  --   --   ALKPHOS 62  --   --   BILITOT 0.6  --   --   PROT 7.8  --   --   ALBUMIN  3.1* 2.2* 2.0*   No results for input(s): "LIPASE", "AMYLASE" in the last 168 hours. No results for input(s): "AMMONIA" in the last 168 hours. Coagulation Profile: Recent Labs  Lab 06/18/23 2218  INR 1.2   Cardiac Enzymes: No results for input(s): "CKTOTAL", "CKMB", "CKMBINDEX", "TROPONINI" in the last 168 hours. BNP (last 3 results) No results for input(s): "PROBNP" in the last 8760 hours. HbA1C: No results for input(s): "HGBA1C" in the last 72 hours. CBG: Recent Labs  Lab 06/24/23 1918 06/24/23 2349 06/25/23 0358 06/25/23 0756 06/25/23 1131  GLUCAP 96 111* 104* 121* 112*   Lipid Profile: No results for input(s): "CHOL", "HDL", "LDLCALC", "TRIG", "CHOLHDL", "LDLDIRECT" in the last 72 hours. Thyroid Function Tests: No results for input(s): "TSH", "T4TOTAL", "FREET4",  "T3FREE", "THYROIDAB" in the last 72 hours. Anemia Panel: No results for input(s): "VITAMINB12", "FOLATE", "FERRITIN", "TIBC", "IRON", "RETICCTPCT" in the last 72 hours. Sepsis Labs: Recent Labs  Lab 06/18/23 2218 06/18/23 2342 06/19/23 0837 06/19/23 1316 06/19/23 1549 06/20/23 0242 06/20/23 1445  PROCALCITON 1.60  --   --   --   --  3.98  --   LATICACIDVEN 4.6*   < > 5.6* 5.2* 4.2*  --  1.6   < > = values in this interval not displayed.    Recent Results (from the past 240 hours)  Culture, blood (Routine x 2)     Status: None   Collection Time: 06/18/23  5:18 PM   Specimen: BLOOD  Result Value Ref Range Status   Specimen Description BLOOD BLOOD RIGHT FOREARM  Final   Special Requests   Final  BOTTLES DRAWN AEROBIC AND ANAEROBIC Blood Culture results may not be optimal due to an inadequate volume of blood received in culture bottles   Culture   Final    NO GROWTH 5 DAYS Performed at [facility], [address] [place], [place] [address]    Report Status 06/23/2023 FINAL  Final  Culture, blood (Routine x 2)     Status: Abnormal   Collection Time: 06/18/23  5:18 PM   Specimen: BLOOD  Result Value Ref Range Status   Specimen Description   Final    BLOOD BLOOD RIGHT ARM Performed at [facility] [place], [address], [place], [place] [address]    Special Requests   Final    BOTTLES DRAWN AEROBIC AND ANAEROBIC Blood Culture results may not be optimal due to an inadequate volume of blood received in culture bottles Performed at [facility], [address], [place], [place] [address]    Culture  Setup Time   Final    GRAM POSITIVE COCCI AEROBIC BOTTLE ONLY CRITICAL RESULT CALLED TO, READ BACK BY AND VERIFIED WITH: Theora Flair PATEL @1102  06/19/23 SKY    Culture (A)  Final    STAPHYLOCOCCUS HOMINIS THE SIGNIFICANCE OF ISOLATING THIS ORGANISM FROM A SINGLE SET OF BLOOD CULTURES WHEN MULTIPLE SETS ARE DRAWN IS UNCERTAIN. PLEASE NOTIFY THE MICROBIOLOGY  DEPARTMENT WITHIN ONE WEEK IF SPECIATION AND SENSITIVITIES ARE REQUIRED. Performed at [facility], [address] [place], [place] [address]    Report Status 06/21/2023 FINAL  Final  Blood Culture ID Panel (Reflexed)     Status: Abnormal   Collection Time: 06/18/23  5:18 PM  Result Value Ref Range Status   Enterococcus faecalis NOT DETECTED NOT DETECTED Final   Enterococcus Faecium NOT DETECTED NOT DETECTED Final   Listeria monocytogenes NOT DETECTED NOT DETECTED Final   Staphylococcus species DETECTED (A) NOT DETECTED Final    Comment: CRITICAL RESULT CALLED TO, READ BACK BY AND VERIFIED WITH: Theora Flair PATEL @ 1102 06/19/23 SKY    Staphylococcus aureus (BCID) NOT DETECTED NOT DETECTED Final   Staphylococcus epidermidis NOT DETECTED NOT DETECTED Final   Staphylococcus lugdunensis NOT DETECTED NOT DETECTED Final   Streptococcus species NOT DETECTED NOT DETECTED Final   Streptococcus agalactiae NOT DETECTED NOT DETECTED Final   Streptococcus pneumoniae NOT DETECTED NOT DETECTED Final   Streptococcus pyogenes NOT DETECTED NOT DETECTED Final   A.calcoaceticus-baumannii NOT DETECTED NOT DETECTED Final   Bacteroides fragilis NOT DETECTED NOT DETECTED Final   Enterobacterales NOT DETECTED NOT DETECTED Final   Enterobacter cloacae complex NOT DETECTED NOT DETECTED Final   Escherichia coli NOT DETECTED NOT DETECTED Final   Klebsiella aerogenes NOT DETECTED NOT DETECTED Final   Klebsiella oxytoca NOT DETECTED NOT DETECTED Final   Klebsiella pneumoniae NOT DETECTED NOT DETECTED Final   Proteus species NOT DETECTED NOT DETECTED Final   Salmonella species NOT DETECTED NOT DETECTED Final   Serratia marcescens NOT DETECTED NOT DETECTED Final   Haemophilus influenzae NOT DETECTED NOT DETECTED Final   Neisseria meningitidis NOT DETECTED NOT DETECTED Final   Pseudomonas aeruginosa NOT DETECTED NOT DETECTED Final   Stenotrophomonas maltophilia NOT DETECTED NOT DETECTED Final   Candida albicans  NOT DETECTED NOT DETECTED Final   Candida auris NOT DETECTED NOT DETECTED Final   Candida glabrata NOT DETECTED NOT DETECTED Final   Candida krusei NOT DETECTED NOT DETECTED Final   Candida parapsilosis NOT DETECTED NOT DETECTED Final   Candida tropicalis NOT DETECTED NOT DETECTED Final  Cryptococcus neoformans/gattii NOT DETECTED NOT DETECTED Final    Comment: Performed at [facility], [address] [place], [place] [address]  Resp panel by RT-PCR (RSV, Flu A&B, Covid) Anterior Nasal Swab     Status: None   Collection Time: 06/18/23  5:25 PM   Specimen: Anterior Nasal Swab  Result Value Ref Range Status   SARS Coronavirus 2 by RT PCR NEGATIVE NEGATIVE Final    Comment: (NOTE) SARS-CoV-2 target nucleic acids are NOT DETECTED.  The SARS-CoV-2 RNA is generally detectable in upper respiratory specimens during the acute phase of infection. The lowest concentration of SARS-CoV-2 viral copies this assay can detect is 138 copies/mL. A negative result does not preclude SARS-Cov-2 infection and should not be used as the sole basis for treatment or other patient management decisions. A negative result may occur with  improper specimen collection/handling, submission of specimen other than nasopharyngeal swab, presence of viral mutation(s) within the areas targeted by this assay, and inadequate number of viral copies(<138 copies/mL). A negative result must be combined with clinical observations, patient history, and epidemiological information. The expected result is Negative.  Fact Sheet for Patients:  BloggerCourse.com  Fact Sheet for Healthcare Providers:  SeriousBroker.it  This test is no t yet approved or cleared by the [place]  FDA and  has been authorized for detection and/or diagnosis of SARS-CoV-2 by FDA under an Emergency Use Authorization (EUA). This EUA will remain  in effect (meaning this test can be used) for  the duration of the COVID-19 declaration under Section 564(b)(1) of the Act, 21 U.S.C.section 360bbb-3(b)(1), unless the authorization is terminated  or revoked sooner.       Influenza A by PCR NEGATIVE NEGATIVE Final   Influenza B by PCR NEGATIVE NEGATIVE Final    Comment: (NOTE) The Xpert Xpress SARS-CoV-2/FLU/RSV plus assay is intended as an aid in the diagnosis of influenza from Nasopharyngeal swab specimens and should not be used as a sole basis for treatment. Nasal washings and aspirates are unacceptable for Xpert Xpress SARS-CoV-2/FLU/RSV testing.  Fact Sheet for Patients: BloggerCourse.com  Fact Sheet for Healthcare Providers: SeriousBroker.it  This test is not yet approved or cleared by the [place]  FDA and has been authorized for detection and/or diagnosis of SARS-CoV-2 by FDA under an Emergency Use Authorization (EUA). This EUA will remain in effect (meaning this test can be used) for the duration of the COVID-19 declaration under Section 564(b)(1) of the Act, 21 U.S.C. section 360bbb-3(b)(1), unless the authorization is terminated or revoked.     Resp Syncytial Virus by PCR NEGATIVE NEGATIVE Final    Comment: (NOTE) Fact Sheet for Patients: BloggerCourse.com  Fact Sheet for Healthcare Providers: SeriousBroker.it  This test is not yet approved or cleared by the [place]  FDA and has been authorized for detection and/or diagnosis of SARS-CoV-2 by FDA under an Emergency Use Authorization (EUA). This EUA will remain in effect (meaning this test can be used) for the duration of the COVID-19 declaration under Section 564(b)(1) of the Act, 21 U.S.C. section 360bbb-3(b)(1), unless the authorization is terminated or revoked.  Performed at [facility], [address] [place], [place] [address]   MRSA Next Gen by PCR, Nasal     Status: None    Collection Time: 06/18/23 10:06 PM   Specimen: Nasal Mucosa; Nasal Swab  Result Value Ref Range Status   MRSA by PCR Next Gen NOT DETECTED NOT DETECTED Final    Comment: (NOTE) The GeneXpert MRSA Assay (FDA approved for  NASAL specimens only), is one component of a comprehensive MRSA colonization surveillance program. It is not intended to diagnose MRSA infection nor to guide or monitor treatment for MRSA infections. Test performance is not FDA approved in patients less than 98 years old. Performed at [facility], [address] [place], [place] [address]   Culture, Respiratory w Gram Stain     Status: None   Collection Time: 06/21/23 11:37 AM   Specimen: Tracheal Aspirate; Respiratory  Result Value Ref Range Status   Specimen Description   Final    TRACHEAL ASPIRATE Performed at [facility], [address], [place], [place] [address]    Special Requests   Final    NONE Performed at [facility], [address] [place], [place] [address]    Gram Stain   Final    RARE WBC PRESENT, PREDOMINANTLY PMN NO ORGANISMS SEEN    Culture   Final    Normal respiratory flora-no Staph aureus or Pseudomonas seen Performed at [facility], [address], [place], [place] [address]    Report Status 06/24/2023 FINAL  Final  MRSA Next Gen by PCR, Nasal     Status: None   Collection Time: 06/21/23  9:54 PM   Specimen: Nasal Mucosa; Nasal Swab  Result Value Ref Range Status   MRSA by PCR Next Gen NOT DETECTED NOT DETECTED Final    Comment: (NOTE) The GeneXpert MRSA Assay (FDA approved for NASAL specimens only), is one component of a comprehensive MRSA colonization surveillance program. It is not intended to diagnose MRSA infection nor to guide or monitor treatment for MRSA infections. Test performance is not FDA approved in patients less than 40 years old. Performed at [facility], [address] [place], [place] [address]           Radiology Studies: CT CHEST WO CONTRAST Result Date: 06/24/2023 CLINICAL DATA:  History of non-small-cell lung cancer. Acute hypoxic respiratory failure. * Tracking Code: BO * EXAM: CT CHEST WITHOUT CONTRAST TECHNIQUE: Multidetector CT imaging of the chest was performed following the standard protocol without IV contrast. RADIATION DOSE REDUCTION: This exam was performed according to the departmental dose-optimization program which includes automated exposure control, adjustment of the mA and/or kV according to patient size and/or use of iterative reconstruction technique. COMPARISON:  Chest radiograph dated 06/22/2023, CT chest dated 12/05/2022 FINDINGS: Cardiovascular: Right chest wall port terminates at the lower SVC. Normal heart size. No significant pericardial fluid/thickening. Great vessels are normal in course and caliber. Coronary artery calcifications and aortic atherosclerosis. Mediastinum/Nodes: Imaged thyroid gland without nodules meeting criteria for imaging follow-up by size. Normal esophagus. 10 mm right paratracheal lymph node (2:72), unchanged. New 16 mm right hilar lymph node (2:86) and increased size of 11 mm subcarinal (2:91) lymph node, previously 6 mm. Lungs/Pleura: The central airways are patent. Biapical paraseptal emphysema. Trace secretions within the trachea. Bilateral lower lobe subsegmental atelectasis. Irregular left upper lobe perihilar masslike consolidation with associated traction bronchiectasis, which may reflect posttreatment changes. Diffuse bilateral peribronchovascular consolidation and ground-glass opacities. No pneumothorax. Small bilateral pleural effusions. Upper abdomen: Normal. Musculoskeletal: No acute or abnormal lytic or blastic osseous lesions. Partially imaged subcutaneous soft tissue stranding of the right distal arm near the elbow. IMPRESSION: 1. Diffuse bilateral peribronchovascular consolidation and ground-glass opacities, which may reflect  multifocal pneumonia or pulmonary edema. 2. Small bilateral pleural effusions. 3. Irregular left upper lobe perihilar masslike consolidation with associated traction bronchiectasis, which may reflect posttreatment changes. 4.  New 16 mm right hilar lymph node and increased size of 11 mm subcarinal lymph node, which may be reactive or metastatic. Recommend attention on follow-up. 5. Partially imaged subcutaneous soft tissue stranding of the right distal arm near the elbow, which may reflect cellulitis. Recommend correlation with physical exam. 6. Aortic Atherosclerosis (ICD10-I70.0) and Emphysema (ICD10-J43.9). Coronary artery calcifications. Assessment for potential risk factor modification, dietary therapy or pharmacologic therapy may be warranted, if clinically indicated. Electronically Signed   By: Limin  Xu M.D.   On: 06/24/2023 18:26   CT HEAD WO CONTRAST ( ) Result Date: 06/24/2023 CLINICAL DATA:  Mental status change. EXAM: CT HEAD WITHOUT CONTRAST TECHNIQUE: Contiguous axial images were obtained from the base of the skull through the vertex without intravenous contrast. RADIATION DOSE REDUCTION: This exam was performed according to the departmental dose-optimization program which includes automated exposure control, adjustment of the mA and/or kV according to patient size and/or use of iterative reconstruction technique. COMPARISON:  MRI head 05/04/2017. FINDINGS: Brain: No acute intracranial hemorrhage. No CT evidence of acute infarct. Nonspecific hypoattenuation in the periventricular and subcortical white matter favored to reflect chronic microvascular ischemic changes. 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 No edema, mass effect, or midline shift. The basilar cisterns are patent. Ventricles: Prominence of the ventricles suggesting underlying parenchymal volume loss. Vascular: Atherosclerotic calcifications of the carotid siphons. No hyperdense vessel. Skull: No acute  or aggressive finding. Orbits: Orbits are symmetric. Sinuses: Mild mucosal thickening in the left sphenoid sinus. No air-fluid levels. Other: Large right mastoid effusion. IMPRESSION: No CT evidence of acute intracranial abnormality. 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 Moderate chronic microvascular ischemic changes and parenchymal volume loss. Large right mastoid effusion. Recommend correlation for evidence of mastoiditis. Electronically Signed   By: Denny Flack M.D.   On: 06/24/2023 10:03        Scheduled Meds:  aspirin   81 mg Per Tube Daily   budesonide  (PULMICORT ) nebulizer solution  0.5 mg Nebulization BID   Chlorhexidine  Gluconate Cloth  6 each Topical Daily   enoxaparin  (LOVENOX ) injection  40 mg Subcutaneous Q24H   insulin  aspart  0-15 Units Subcutaneous Q4H   ipratropium-albuterol   3 mL Nebulization Q6H   metoprolol  tartrate  25 mg Per Tube BID   mouth rinse  15 mL Mouth Rinse Q2H   polyethylene glycol  17 g Oral Daily   sodium chloride  HYPERTONIC  4 mL Nebulization TID   thiamine   100 mg Oral Daily   Continuous Infusions:   LOS: 7 days      Tiajuana Fluke, MD [facility]   If 7PM-7AM, please contact night-coverage  06/25/2023, 2:11 PM

## 2023-06-25 NOTE — Progress Notes (Signed)
 Nutrition Follow-up  DOCUMENTATION CODES:   Not applicable  INTERVENTION:   -RD will follow for diet advancement and add supplements as appropriate -If pot remains unable to advance to PO diet, recommend placement of NGT for nutrition (this recommendation was discussed with RN, MD, SLP, and palliative care):   Initiate Osmolite 1.5 @ 20 ml/hr and increase by 10 ml every 12 hours to goal rate of 50 ml/hr.   60 ml Prosource TF BID  If no IVFs, 175 ml free water  flush every 4 hours  Tube feeding regimen provides 2040 kcal (100% of needs), 115 grams of protein, and 914 ml of H2O. Total free water : 1964 ml daily  -Pt remains at high refeeding risk. If feeding are started, recommend:  -Monitor Mg, K, and Phos and replete as needed  -100 mg thiamine  daily x 7 days -MVI with minerals daily  NUTRITION DIAGNOSIS:   Inadequate oral intake related to inability to eat as evidenced by NPO status.  Ongoing  GOAL:   Patient will meet greater than or equal to 90% of their needs  Unmet  MONITOR:   Diet advancement, Labs, Weight trends, I & O's, Skin  REASON FOR ASSESSMENT:   Consult Enteral/tube feeding initiation and management  ASSESSMENT:   83 y/o male with h/o HLD, GERD, HTN, DM, small cell lung cancer diagnosed in 2018 status post chemoradiation in remission, CVA with hemiparesis, CKD, PAD, COPD and hepatitis C who is admitted with NSTEMI, PNA, septic shock and AKI.  4/16- extubated 4/17- s/p BSE- NPO  Reviewed I/O's: -1 L x 24 hours and +9.9 L since admission  UOP: 1 L x 24 hours  Pt working with physical therapy at time of visit.   Case discussed with RN, who reports pt is NPO and suspects that diet will not advance quickly. Pt may need NGT, however, will not be placed today.   Case discussed with RN, palliative care SLP, and MD. SLP will re-eval, but will likely need NGT. Per palliative care and MD, pt family is leaning towards full scope care and agreeable to NGT  is pt unable to advance to PO diet.   Medications reviewed and include lovenox , miralax , and thiamine .   Labs reviewed: CBGS: 96-126 (inpatient orders for glycemic control are 0-15 units insulin  aspart every 4 hours).    Diet Order:   Diet Order             Diet NPO time specified  Diet effective now                   EDUCATION NEEDS:   No education needs have been identified at this time  Skin:  Skin Assessment: Reviewed RN Assessment  Last BM:  06/23/23  Height:   Ht Readings from Last 1 Encounters:  06/22/23 6\' 3"  (1.905 m)    Weight:   Wt Readings from Last 1 Encounters:  06/25/23 88.1 kg    Ideal Body Weight:  89.1 kg  BMI:  Body mass index is 24.28 kg/m.  Estimated Nutritional Needs:   Kcal:  1950-2150  Protein:  105-120 grams  Fluid:  1.9-2.1 L    Herschel Lords, RD, LDN, CDCES Registered Dietitian III Certified Diabetes Care and Education Specialist If unable to reach this RD, please use "RD Inpatient" group chat on secure chat between hours of 8am-4 pm daily

## 2023-06-25 NOTE — Progress Notes (Signed)
 Physical Therapy Treatment Patient Details Name: Edwin Jones MRN: [id number] DOB: 1940/10/11 Today's Date: 06/25/2023   History of Present Illness Pt is an 83 y/o M admitted on 06/18/23 after presenting to the ED with c/o respiratory distress. Pt required intubation, found to have multifocal PNA with sepsis. Pt also being treated for NSTEMI, septic shock. Pt extubated 06/23/23. PMH: DM2, HTN, small cell lung CA, COPD, Hep C, hypercholesterolemia, CVA with residual R sided deficits    PT Comments  Pt received supine in bed with family at bedside. Per daughter, pt has purewick malfunction. TotalA+2 for R/L rolling for doffing soiled bed linens  and placing hoyer pad underneath pt. Once in side lying pt able to maintain with bed rails. Use of hoyer lift to recliner for core stability/pulmonary toileting in sitting as at baseline pt can sit EOB for meals. Pt left in care of OT and RN chairside. Will continue to progress pt's functional mobility as able. D/c recs remain appropriate.    If plan is discharge home, recommend the following: Two people to help with walking and/or transfers;Two people to help with bathing/dressing/bathroom;Assistance with feeding   Can travel by private vehicle     No  Equipment Recommendations  Other (comment) (TBD by next venue)    Recommendations for Other Services       Precautions / Restrictions Precautions Precautions: Fall Restrictions Weight Bearing Restrictions Per Provider Order: No     Mobility  Bed Mobility Overal bed mobility: Needs Assistance Bed Mobility: Rolling Rolling: Total assist, +2 for physical assistance, Used rails         General bed mobility comments: can maintain side lying using bed rail. Patient Response: Cooperative, Flat affect  Transfers                   General transfer comment: hoyer lift to recliner for pulmonary toileting.    Ambulation/Gait               General Gait Details: does not ambulate  at baseline.   Stairs             Wheelchair Mobility     Tilt Bed Tilt Bed Patient Response: Cooperative, Flat affect  Modified Rankin (Stroke Patients Only)       Balance Overall balance assessment: Needs assistance Sitting-balance support: Feet supported, Bilateral upper extremity supported Sitting balance-Leahy Scale: Fair Sitting balance - Comments: with back supported in recliner and feet on floor                                    Communication Communication Communication: Impaired Factors Affecting Communication: Reduced clarity of speech;Difficulty expressing self  Cognition Arousal: Alert Behavior During Therapy: Flat affect   PT - Cognitive impairments: History of cognitive impairments, Difficult to assess Difficult to assess due to: Impaired communication                     PT - Cognition Comments: nods yes or no to questions. Demos understanding of directions with physical responses Following commands: Intact Following commands impaired: Follows one step commands with increased time    Cueing Cueing Techniques: Verbal cues, Gestural cues, Tactile cues  Exercises      General Comments        Pertinent Vitals/Pain Pain Assessment Pain Assessment: Faces Faces Pain Scale: No hurt    Home Living  Prior Function            PT Goals (current goals can now be found in the care plan section) Acute Rehab PT Goals Patient Stated Goal: get better, return to PLOF PT Goal Formulation: With family Time For Goal Achievement: 07/08/23 Potential to Achieve Goals: Fair Progress towards PT goals: Progressing toward goals    Frequency    Min 2X/week      PT Plan      Co-evaluation PT/OT/SLP Co-Evaluation/Treatment: Yes Reason for Co-Treatment: Complexity of the patient's impairments (multi-system involvement);Necessary to address cognition/behavior during functional activity;For  patient/therapist safety PT goals addressed during session: Balance;Mobility/safety with mobility OT goals addressed during session: ADL's and self-care      AM-PAC PT "6 Clicks" Mobility   Outcome Measure  Help needed turning from your back to your side while in a flat bed without using bedrails?: Total Help needed moving from lying on your back to sitting on the side of a flat bed without using bedrails?: Total Help needed moving to and from a bed to a chair (including a wheelchair)?: Total Help needed standing up from a chair using your arms (e.g., wheelchair or bedside chair)?: Total Help needed to walk in hospital room?: Total Help needed climbing 3-5 steps with a railing? : Total 6 Click Score: 6    End of Session Equipment Utilized During Treatment: Oxygen;Other (comment) (hoyer lift) Activity Tolerance: Patient tolerated treatment well Patient left: in chair;with call bell/phone within reach;with nursing/sitter in room Nurse Communication: Mobility status PT Visit Diagnosis: Other abnormalities of gait and mobility (R26.89);Muscle weakness (generalized) (M62.81)     Time: 1421-1450 PT Time Calculation (min) (ACUTE ONLY): 29 min  Charges:    $Therapeutic Activity: 8-22 mins PT General Charges $$ ACUTE PT VISIT: 1 Visit                    Marc Senior. Fairly IV, PT, DPT Physical Therapist- [facility]  06/25/2023, 3:14 PM

## 2023-06-25 NOTE — Care Management Important Message (Signed)
 Important Message  Patient Details  Name: Edwin Jones MRN: [id number] Date of Birth: 09-25-1940   Important Message Given:  Yes - Medicare IM     [name] 06/25/2023, 11:38 AM

## 2023-06-25 NOTE — Consult Note (Signed)
 Consultation Note Date: 06/25/2023   Patient Name: Edwin Jones  DOB: May 12, 1940  MRN: [id number]  Age / Sex: 83 y.o., male  PCP: Macie Saxon, MD Referring Physician: Tiajuana Fluke, MD  Reason for Consultation: Establishing goals of care   HPI/Brief Hospital Course: 83 y.o. 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  Admitted initially to the ICU treated for multifocal pneumonia with septic shock requiring mechanical ventilation from 4/11 - 4/16 Transferred to service to [facility] 4/17  Palliative medicine was consulted for assisting with goals of care conversations  Subjective:  Extensive chart review has been completed prior to meeting patient including labs, vital signs, imaging, progress notes, orders, and available advanced directive documents from current and previous encounters.  Visited with Mr. Gorin at his bedside.  He is awake, unable to verbally communicate, follows simple commands intermittently appropriately nod to simple questions.  No family at bedside during initial visit.  Returned to bedside once alerted by nursing staff that family at bedside.  Met with wife [name] and daughter Marlin Simmonds.  Introduced myself as a Publishing rights manager as a member of the palliative care team. Explained palliative medicine is specialized medical care for people living with serious illness. It focuses on providing relief from the symptoms and stress of a serious illness. The goal is to improve quality of life for both the patient and the family.   [name] shares she and Mr. Stankovich  have been married for about 50 years.  [name] is Mr. Genoveva Kidney primary caregiver in the home.  They also have 1 son that lives locally but also assist with caregiving.  His other children that live out of state in [place].  [name] says at baseline Mr. Knaggs requires assistance with ADLs.  He has received on and off therapy within their home and during their last visit several months prior with ambulating short distances with a walker.  [name] shares over the last several months to years Mr. Lacek spends the majority of his day sleeping but is up late at night watching TV.  We discussed patient's current illness and what it means in the larger context of patient's on-going co-morbidities. Natural disease trajectory and expectations at EOL were discussed.   Discussed in detail Mr. Hamad current medical conditions as well as his multiple underlying comorbid conditions.  At baseline [name] shares Mr. Fawver is able to eat a regular diet without swallowing difficulty.  Discussed speech therapist latest recommendations for n.p.o. status based on bedside swallow evaluation.  Explained to speech therapy will continue to follow and assess readiness for p.o. intake.  Briefly discussed possibility for the need of artificial nutrition for which family seemed to be open to.  [name] shares she is open to any and all treatments or interventions that Mr. Blades would require to continue improving.  [name] discusses her hesitancy around Mr. Scholz being moved out of the ICU/stepdown and the hesitancy she has about Mr. Lina discharging to a rehab facility.  [name] also discusses the importance of faith to her and Mr. Olesen and that she prays and remains hopeful for Mr. Darley complete recovery.  I  discussed importance of continued conversations with family/support persons and all members of their medical team regarding overall plan of care and treatment options ensuring decisions are in  alignment with patients goals of care.  All questions/concerns addressed. Emotional support provided to patient/family/support persons. PMT will continue to follow and support patient as needed.  Objective: Primary Diagnoses: Present on Admission:  Acute hypoxic respiratory failure (HCC)   Physical Exam Constitutional:      General: He is not in acute distress.    Appearance: He is ill-appearing.  Pulmonary:     Effort: Pulmonary effort is normal. No respiratory distress.  Skin:    General: Skin is warm and dry.  Neurological:     Mental Status: He is alert.     Motor: Weakness present.     Comments: Non verbal     Vital Signs: BP 129/72 (BP Location: Left Arm)   Pulse 86   Temp 97.9 F (36.6 C)   Resp 16   Ht 6\' 3"  (1.905 m)   Wt 88.1 kg   SpO2 100%   BMI 24.28 kg/m  Pain Scale: PAINAD   Pain Score: 0-No pain  IO: Intake/output summary:  Intake/Output Summary (Last 24 hours) at 06/25/2023 1509 Last data filed at 06/25/2023 0500 Gross per 24 hour  Intake --  Output 1000 ml  Net -1000 ml    LBM: Last BM Date : 06/23/23 Baseline Weight: Weight: 95.3 kg Most recent weight: Weight: 88.1 kg      Assessment and Plan  SUMMARY OF RECOMMENDATIONS   Time for outcomes Ongoing GOC discussions to be had  Palliative Prophylaxis:   Bowel Regimen, Delirium Protocol and Frequent Pain Assessment  Discussed With: Primary team and nursing staff   Thank you for this consult and allowing Palliative Medicine to participate in the care of Rubbie Cornwall. Palliative medicine will continue to follow and assist as needed.   Time Total: 75 minutes  Time spent includes: Detailed review of medical records (labs, imaging, vital signs), medically appropriate exam (mental status, respiratory, cardiac, skin), discussed with treatment team, counseling and educating patient, family and staff, documenting clinical information, medication management and coordination of care.   Signed  by: [name], DNP, AGNP-C Palliative Medicine    Please contact Palliative Medicine Team phone at [phone] for questions and concerns.  For individual provider: See Tilford Foley

## 2023-06-25 NOTE — Progress Notes (Signed)
 SLP Cancellation Note  Patient Details Name: Edwin Jones MRN: [id number] DOB: 1941-01-25   Cancelled treatment:       Reason Eval/Treat Not Completed:  (chart reviewed)  Attempted to see pt early this AM and again in the PM but pt was w/ others services both times. Pt was just extubated on 06/24/23 s/p septic shock and acute respiratory failure. Per MD note: History of small cell lung cancer status post chemoradiation in 2018 in remission now with suspicion of recurrence..  ST services will f/u in the morning w/ swallow assessment. Recommend frequent oral care for hygiene and stimulation of swallowing.    Comer Portugal, MS, CCC-SLP Speech Language Pathologist Rehab Services; [facility] [phone] (ascom) Kamayah Pillay 06/25/2023, 4:53 PM

## 2023-06-26 DIAGNOSIS — J9601 Acute respiratory failure with hypoxia: Secondary | ICD-10-CM | POA: Diagnosis not present

## 2023-06-26 DIAGNOSIS — A419 Sepsis, unspecified organism: Secondary | ICD-10-CM | POA: Diagnosis not present

## 2023-06-26 DIAGNOSIS — Z515 Encounter for palliative care: Secondary | ICD-10-CM | POA: Diagnosis not present

## 2023-06-26 DIAGNOSIS — J189 Pneumonia, unspecified organism: Secondary | ICD-10-CM | POA: Diagnosis not present

## 2023-06-26 LAB — CBC
HCT: 32.1 % — ABNORMAL LOW (ref 39.0–52.0)
Hemoglobin: 10.7 g/dL — ABNORMAL LOW (ref 13.0–17.0)
MCH: 31 pg (ref 26.0–34.0)
MCHC: 33.3 g/dL (ref 30.0–36.0)
MCV: 93 fL (ref 80.0–100.0)
Platelets: 367 10*3/uL (ref 150–400)
RBC: 3.45 MIL/uL — ABNORMAL LOW (ref 4.22–5.81)
RDW: 15.5 % (ref 11.5–15.5)
WBC: 9.6 10*3/uL (ref 4.0–10.5)
nRBC: 0 % (ref 0.0–0.2)

## 2023-06-26 LAB — BASIC METABOLIC PANEL WITH GFR
Anion gap: 12 (ref 5–15)
BUN: 41 mg/dL — ABNORMAL HIGH (ref 8–23)
CO2: 26 mmol/L (ref 22–32)
Calcium: 9 mg/dL (ref 8.9–10.3)
Chloride: 103 mmol/L (ref 98–111)
Creatinine, Ser: 1.5 mg/dL — ABNORMAL HIGH (ref 0.61–1.24)
GFR, Estimated: 46 mL/min — ABNORMAL LOW (ref >60)
Glucose, Bld: 114 mg/dL — ABNORMAL HIGH (ref 70–99)
Potassium: 4.2 mmol/L (ref 3.5–5.1)
Sodium: 141 mmol/L (ref 135–145)

## 2023-06-26 LAB — GLUCOSE, CAPILLARY
Glucose-Capillary: 110 mg/dL — ABNORMAL HIGH (ref 70–99)
Glucose-Capillary: 106 mg/dL — ABNORMAL HIGH (ref 70–99)
Glucose-Capillary: 132 mg/dL — ABNORMAL HIGH (ref 70–99)
Glucose-Capillary: 125 mg/dL — ABNORMAL HIGH (ref 70–99)
Glucose-Capillary: 110 mg/dL — ABNORMAL HIGH (ref 70–99)
Glucose-Capillary: 107 mg/dL — ABNORMAL HIGH (ref 70–99)

## 2023-06-26 MED ORDER — SODIUM CHLORIDE 0.9% FLUSH: 10.0000 mL | INTRAVENOUS | Status: DC | PRN

## 2023-06-26 MED ORDER — SODIUM CHLORIDE 0.9% FLUSH
10.0000 mL | Freq: Two times a day (BID) | INTRAVENOUS | Status: DC
  Administered 2023-06-26 – 2023-07-05 (×18): 10 mL

## 2023-06-26 NOTE — Progress Notes (Signed)
 Daily Progress Note   Patient Name: Edwin Jones      Date: 06/26/2023 DOB: 1940/08/26  Age: 83 y.o. MRN#: [id number] Attending Physician: Tiajuana Fluke, MD Primary Care Physician: Macie Saxon, MD Admit Date: 06/18/2023  Reason for Consultation/Follow-up: Establishing goals of care  HPI/Brief Hospital Review: 83 y.o. 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   Admitted initially to the ICU treated for multifocal pneumonia with septic shock requiring mechanical ventilation from 4/11 - 4/16 Transferred to service to [facility] 4/17   Palliative medicine was consulted for assisting with goals of care conversations  Subjective: Extensive chart review has been completed prior to meeting patient including labs, vital signs, imaging, progress notes, orders, and available advanced directive documents from current and previous encounters.    Visited with Edwin Jones at his bedside earlier in the day.  He is awake, alert, remains nonverbal, nods head appropriately to simple questions, can follow simple commands.  No family at bedside during initial visit.  Received updates from SLP via secure chat, [name] remains high risk for aspiration and recommendation remains for n.p.o. status at this time.  Conversations had with primary team regarding artificial nutrition via NG tube/Dobbhoff.  Called and spoke with wife Edwin Jones  regarding results of SLP evaluation and recommendations for artificial nutrition.  On the phone Edwin Jones became upset and expressed her fears regarding her husband's care.  Arranged time to meet at bedside later this afternoon.  Returned to bedside to meet with Edwin Jones.  Discussed in detail Dobbhoff placement and artificial nutrition via tube feed.  Edwin Jones adamantly voices her desire to avoid any medications that would cause Edwin Jones to become sedated to relieve anxiety or agitation-this was passed along to primary team.  Edwin Jones is in agreement with Dobbhoff placement and during our conversations Edwin Jones able to nod his head yes to also being interested in having Dobbhoff placed for artificial nutrition.  Edwin Jones remains clear that she  wishes for Edwin Jones to receive any and all available interventions or treatments with hopes of a full recovery and his ability to return home.  Shared with Edwin Jones as it is later in the day dietitian likely unavailable.  Will hold off on Dobbhoff placement until tomorrow once we can get tube feeding orders from dietitian for which she agreed.  Answered and addressed all questions and concerns.  PMT remains available for ongoing needs and support.  Objective:  Physical Exam Constitutional:      General: He is not in acute distress.    Appearance: He is ill-appearing.  Pulmonary:     Effort: Pulmonary effort is normal. No respiratory distress.  Abdominal:     General: Abdomen is flat.     Palpations: Abdomen is soft.  Skin:    General: Skin is warm and dry.  Neurological:     Mental Status: He is alert. He is disoriented.     Motor: Weakness present.             Vital Signs: BP 134/77 (BP Location: Left Arm)   Pulse 91   Temp 98.3 F (36.8 C)   Resp 19   Ht 6\' 3"  (1.905 m)   Wt 90.5 kg   SpO2 96%   BMI 24.94 kg/m  SpO2: SpO2: 96 % O2 Device: O2 Device: Nasal Cannula O2 Flow Rate: O2 Flow Rate (L/min): (S) 5 L/min   Palliative  Care Assessment & Plan   Assessment/Recommendation/Plan  Ongoing GOC conversations Wish to proceed with NGT placement for artificial nutrition  Care plan was discussed with primary team and nursing staff  Thank you for allowing the Palliative Medicine Team to assist in the care of this patient.  Total time:  50 minutes  Time spent includes: Detailed review of medical records (labs, imaging, vital signs), medically appropriate exam (mental status, respiratory, cardiac, skin), discussed with treatment team, counseling and educating patient, family and staff, documenting clinical information, medication management and coordination of care.  Edwin Marble, DNP, AGNP-C Palliative Medicine   Please contact Palliative Medicine Team phone at [phone] for questions and concerns.

## 2023-06-26 NOTE — Plan of Care (Signed)

## 2023-06-26 NOTE — Progress Notes (Signed)
 Speech Language Pathology Treatment: Dysphagia  Patient Details Name: Edwin Jones MRN: [id number] DOB: [birth year] Today's Date: 06/26/2023 Time: 4098-1191 SLP Time Calculation (min) (ACUTE ONLY): 20 min  Assessment / Plan / Recommendation Clinical Impression  Pt remains afebrile, NPO, WBC 9.6. 5L nasal canula placed with O2 saturations maintained at 100 for duration of session. CT Chest on 4/17 revealing, "Diffuse bilateral peribronchovascular consolidation and ground-glass opacities, which may reflect multifocal pneumonia or pulmonary edema. Small bilateral pleural effusions. Irregular left upper lobe perihilar masslike consolidation with associated traction bronchiectasis, which may reflect posttreatment changes. New 16 mm right hilar lymph node and increased size of 11 mm subcarinal lymph node, which may be reactive or metastatic. Recommend attention on follow-up."  Pt seen today for follow up dysphagia intervention. Pt lethargic, with tactile cues provided for increased alertness/engagement. Oral care completed. Trials completed of thin liquid and ice chips. Oral manipulation remains significantly prolonged, question impact of cognitive awareness of task vs sensation vs motor deficits from previous CVA. Pt did not attempt to pull on straw and demonstrated limited labial closure for cup sip, leading to anterior loss. Delayed congested cough noted following ice chip- with oral suction provided.   Given reduced attention to task, significantly prolonged oral manipulation, and risk for aspiration, further trials not completed. Currently pt is a HIGH risk for aspiration given current mentation/dependency with feeding tasks, pulmonary status, hx of dysphagia/CVA, lethargy, and intubation during this admission. Recommend continued NPO. RN, MD, RD aware of recommendations. SLP will continue to follow to determine readiness for PO vs need for instrumental assessment.    HPI HPI: Per critical care  progress note, "Mr. Richens is an 83 year old African-American male with a history of type 2 diabetes, hypertension, small cell lung cancer, and COPD who presented to the ED via EMS with complaints of respiratory distress. History is obtained from EMS records as well as ED records as patient is currently intubated and sedated and there is no family at the bedside. Per ED records, EMS was called for respiratory distress. Upon EMS arrival, patient's oxygen saturation was 60%. Bag mask ventilation was started and patient was transported to the ED after an unsuccessful intubation attempt in the field. Upon ED arrival, patient had copious amounts of secretions in his oral cavity. Initial ED workup revealed multifocal pneumonia with sepsis. Patient was placed on BiPAP however he became combative and started coughing profusely and also had large amounts of secretions that filled up the BiPAP mask. His oxygen saturation dropped and he was emergently intubated. PCCM was consulted for further management." Pt intubated 4/11-7/16. Per RN, spouse reported pt was ind with eating/drinking at baseline. Head CT 4/17: No CT evidence of acute intracranial abnormality.      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  Moderate chronic microvascular ischemic changes and parenchymal  volume loss.     Large right mastoid effusion. Recommend correlation for evidence of  mastoiditis. CXR 4/15: Patchy airspace disease in both lung bases, right greater than  left. Imaging features could be related to atelectasis or  infiltrate.      SLP Plan  Continue with current plan of care      Recommendations for follow up therapy are one component of a multi-disciplinary discharge planning process, led by the attending physician.  Recommendations may be updated based on patient status, additional functional criteria and insurance authorization.    Recommendations  Diet recommendations: NPO Medication Administration: Via  alternative means  Oral care QID;Oral care prior to ice chip/H20   None Dysphagia, oropharyngeal phase (R13.12)     Continue with current plan of care    [place] Dysen Edmondson Clapp, MS, CCC-SLP Speech Language Pathologist Rehab Services; [facility] [phone] (ascom)   [place] J Clapp  06/26/2023, 9:54 AM

## 2023-06-26 NOTE — Progress Notes (Signed)
 PROGRESS NOTE    Edwin Jones  ZOX:[id number] DOB: 07-30-1940 DOA: 06/18/2023 PCP: Macie Saxon, MD    Brief Narrative:  83 year old male patient with a past medical history of limited small cell lung cancer diagnosed in 2018 status post chemoradiation in remission with recent suspicion for recurrence was 4 or FDG avid on PET scan done in late 2024 presenting to [facility] on 0/11 with acute hypoxic respiratory failure secondary to community-acquired pneumonia complicated by septic shock.   4/18: Care transferred to [facility]   Assessment & Plan:   Principal Problem:   Acute hypoxic respiratory failure (HCC)   #Acute respiratory failure  #Pneumonia #Aspiration risk # Septic shock, resolved Patient required endotracheal intubation.  Was successfully extubated on 4/17.  Remained stable on 4 to 6 L nasal cannula.  Care transferred to TRH service. Plan: Completed course of antibiotics Monitor vitals and fever curve Aggressive pulmonary hygiene RD following closely Wean oxygen as tolerated  #Acute metabolic encephalopathy  In the setting of septic shock and prolonged ventilatory support.  Mental status remains poor.  Inability to tolerate p.o. intake or p.o. medications.  Reevaluation by SLP on 4/19.  Continue to recommend n.p.o. due to mentation Plan: Delirium precautions Will likely need NGT placement for tube feeds   #NSTEMI  Echo 06/19/23: EF 50 to 55%; grade I diastolic dysfunction; mild mitral valve regurgitation; trivial aortic valve regurgitation  High-sensitivity troponin peaked at 6200.  Completed 72 hours of heparin  GTT.  No shock physiology noted.  Cardiology not planning any inpatient intervention.   #Acute kidney injury likely secondary to ATN #Hypomagnesia  Creatinine improving.  Suspect ATN in the setting of respiratory failure and acute illness Daily labs Avoid nephrotoxins Optimize electrolytes  #Type II diabetes mellitus  - CBG's q4hrs  - SSI  - Long acting  insulin  discontinued for now  - Target CBG range 140 to 180 - Follow hypo/hyperglycemic protocol   # History of small cell lung cancer status post chemoradiation in 2018 in remission now with suspicion of recurrence.  CT chest with concern for cancer recurrence.  Case discussed with pulmonology who discussed with patient's family.  Patient will likely need repeat diagnostic workup which will involve a bronchoscopy with likely EBUS.  Pulmonology to coordinate for timing.  DVT prophylaxis: SQ Lovenox  Code Status: Full Family Communication: Daughter and spouse at bedside 4/19 Disposition Plan: Status is: Inpatient Remains inpatient appropriate because: Multiple acute issues as above   Level of care: Telemetry Medical  Consultants:  Cardiology, signed off Palliative care  Procedures:  Endotracheal intubation  Antimicrobials:   Subjective: Seen and examined.  Oxygen stable at 5 L.  Remains weak fatigued and encephalopathic.  Objective: Vitals:   06/26/23 0332 06/26/23 0457 06/26/23 0547 06/26/23 0803  BP: (!) 143/71   137/64  Pulse: 79   72  Resp: 16   19  Temp: 98 F (36.7 C)   97.7 F (36.5 C)  TempSrc: Oral     SpO2: 99%  93% 93%  Weight:  90.5 kg    Height:        Intake/Output Summary (Last 24 hours) at 06/26/2023 1123 Last data filed at 06/26/2023 0457 Gross per 24 hour  Intake 240 ml  Output 450 ml  Net -210 ml   Filed Weights   06/24/23 0633 06/25/23 0500 06/26/23 0457  Weight: 88.9 kg 88.1 kg 90.5 kg    Examination:  General exam: Ill-appearing, frail, fatigued Respiratory system: Coarse breath sounds bilaterally.  Normal work  of breathing.  5 L Cardiovascular system: S1-2, RRR, no murmurs, no pedal edema Gastrointestinal system: Soft, NT/ND, normal bowel sounds Central nervous system: Alert.  Unable to assess orientation Extremities: Markedly decreased power bilaterally Skin: No rashes, lesions or ulcers Psychiatry: Unable to assess    Data  Reviewed: I have personally reviewed following labs and imaging studies  CBC: Recent Labs  Lab 06/22/23 0452 06/23/23 0457 06/24/23 0508 06/25/23 0525 06/26/23 0236  WBC 10.9* 12.8* 13.3* 11.8* 9.6  HGB 9.8* 10.2* 10.9* 11.0* 10.7*  HCT 29.1* 31.1* 32.4* 33.2* 32.1*  MCV 92.1 94.5 91.5 94.3 93.0  PLT 302 322 357 344 367   Basic Metabolic Panel: Recent Labs  Lab 06/21/23 0450 06/22/23 0452 06/23/23 0457 06/24/23 0508 06/25/23 0525 06/26/23 0236  NA 136 138 139 141 141 141  K 3.7 4.2 4.1 4.3 4.2 4.2  CL 106 109 108 105 103 103  CO2 23 23 23 28 25 26   GLUCOSE 247* 207* 143* 91 116* 114*  BUN 31* 33* 38* 43* 41* 41*  CREATININE 1.61* 1.65* 1.39* 1.63* 1.40* 1.50*  CALCIUM 7.8* 8.0* 8.1* 8.7* 8.6* 9.0  MG 2.0 1.9 1.9 1.9 2.1  --   PHOS 1.8* 2.1* 3.5 3.5 3.6  --    GFR: Estimated Creatinine Clearance: 44.6 mL/min (A) (by C-G formula based on SCr of 1.5 mg/dL (H)). Liver Function Tests: Recent Labs  Lab 06/20/23 0248 06/21/23 0450  ALBUMIN  2.2* 2.0*   No results for input(s): "LIPASE", "AMYLASE" in the last 168 hours. No results for input(s): "AMMONIA" in the last 168 hours. Coagulation Profile: No results for input(s): "INR", "PROTIME" in the last 168 hours.  Cardiac Enzymes: No results for input(s): "CKTOTAL", "CKMB", "CKMBINDEX", "TROPONINI" in the last 168 hours. BNP (last 3 results) No results for input(s): "PROBNP" in the last 8760 hours. HbA1C: No results for input(s): "HGBA1C" in the last 72 hours. CBG: Recent Labs  Lab 06/25/23 1638 06/25/23 2045 06/25/23 2325 06/26/23 0334 06/26/23 0804  GLUCAP 135* 146* 141* 110* 106*   Lipid Profile: No results for input(s): "CHOL", "HDL", "LDLCALC", "TRIG", "CHOLHDL", "LDLDIRECT" in the last 72 hours. Thyroid Function Tests: No results for input(s): "TSH", "T4TOTAL", "FREET4", "T3FREE", "THYROIDAB" in the last 72 hours. Anemia Panel: No results for input(s): "VITAMINB12", "FOLATE", "FERRITIN", "TIBC",  "IRON", "RETICCTPCT" in the last 72 hours. Sepsis Labs: Recent Labs  Lab 06/19/23 1316 06/19/23 1549 06/20/23 0242 06/20/23 1445  PROCALCITON  --   --  3.98  --   LATICACIDVEN 5.2* 4.2*  --  1.6    Recent Results (from the past 240 hours)  Culture, blood (Routine x 2)     Status: None   Collection Time: 06/18/23  5:18 PM   Specimen: BLOOD  Result Value Ref Range Status   Specimen Description BLOOD BLOOD RIGHT FOREARM  Final   Special Requests   Final    BOTTLES DRAWN AEROBIC AND ANAEROBIC Blood Culture results may not be optimal due to an inadequate volume of blood received in culture bottles   Culture   Final    NO GROWTH 5 DAYS Performed at [facility], [address] [place], [place] [address]    Report Status 06/23/2023 FINAL  Final  Culture, blood (Routine x 2)     Status: Abnormal   Collection Time: 06/18/23  5:18 PM   Specimen: BLOOD  Result Value Ref Range Status   Specimen Description   Final    BLOOD BLOOD RIGHT ARM Performed at  [facility], [address], [place], [place] [address]    Special Requests   Final    BOTTLES DRAWN AEROBIC AND ANAEROBIC Blood Culture results may not be optimal due to an inadequate volume of blood received in culture bottles Performed at [facility], [address] [place], [place] [address]    Culture  Setup Time   Final    GRAM POSITIVE COCCI AEROBIC BOTTLE ONLY CRITICAL RESULT CALLED TO, READ BACK BY AND VERIFIED WITH: Theora Flair PATEL @1102  06/19/23 SKY    Culture (A)  Final    STAPHYLOCOCCUS HOMINIS THE SIGNIFICANCE OF ISOLATING THIS ORGANISM FROM A SINGLE SET OF BLOOD CULTURES WHEN MULTIPLE SETS ARE DRAWN IS UNCERTAIN. PLEASE NOTIFY THE MICROBIOLOGY DEPARTMENT WITHIN ONE WEEK IF SPECIATION AND SENSITIVITIES ARE REQUIRED. Performed at [facility], [address], [place], [place] [address]    Report Status 06/21/2023 FINAL  Final  Blood Culture ID Panel (Reflexed)     Status: Abnormal    Collection Time: 06/18/23  5:18 PM  Result Value Ref Range Status   Enterococcus faecalis NOT DETECTED NOT DETECTED Final   Enterococcus Faecium NOT DETECTED NOT DETECTED Final   Listeria monocytogenes NOT DETECTED NOT DETECTED Final   Staphylococcus species DETECTED (A) NOT DETECTED Final    Comment: CRITICAL RESULT CALLED TO, READ BACK BY AND VERIFIED WITH: Theora Flair PATEL @ 1102 06/19/23 SKY    Staphylococcus aureus (BCID) NOT DETECTED NOT DETECTED Final   Staphylococcus epidermidis NOT DETECTED NOT DETECTED Final   Staphylococcus lugdunensis NOT DETECTED NOT DETECTED Final   Streptococcus species NOT DETECTED NOT DETECTED Final   Streptococcus agalactiae NOT DETECTED NOT DETECTED Final   Streptococcus pneumoniae NOT DETECTED NOT DETECTED Final   Streptococcus pyogenes NOT DETECTED NOT DETECTED Final   A.calcoaceticus-baumannii NOT DETECTED NOT DETECTED Final   Bacteroides fragilis NOT DETECTED NOT DETECTED Final   Enterobacterales NOT DETECTED NOT DETECTED Final   Enterobacter cloacae complex NOT DETECTED NOT DETECTED Final   Escherichia coli NOT DETECTED NOT DETECTED Final   Klebsiella aerogenes NOT DETECTED NOT DETECTED Final   Klebsiella oxytoca NOT DETECTED NOT DETECTED Final   Klebsiella pneumoniae NOT DETECTED NOT DETECTED Final   Proteus species NOT DETECTED NOT DETECTED Final   Salmonella species NOT DETECTED NOT DETECTED Final   Serratia marcescens NOT DETECTED NOT DETECTED Final   Haemophilus influenzae NOT DETECTED NOT DETECTED Final   Neisseria meningitidis NOT DETECTED NOT DETECTED Final   Pseudomonas aeruginosa NOT DETECTED NOT DETECTED Final   Stenotrophomonas maltophilia NOT DETECTED NOT DETECTED Final   Candida albicans NOT DETECTED NOT DETECTED Final   Candida auris NOT DETECTED NOT DETECTED Final   Candida glabrata NOT DETECTED NOT DETECTED Final   Candida krusei NOT DETECTED NOT DETECTED Final   Candida parapsilosis NOT DETECTED NOT DETECTED Final   Candida  tropicalis NOT DETECTED NOT DETECTED Final   Cryptococcus neoformans/gattii NOT DETECTED NOT DETECTED Final    Comment: Performed at [facility], [address] [place], [place] [address]  Resp panel by RT-PCR (RSV, Flu A&B, Covid) Anterior Nasal Swab     Status: None   Collection Time: 06/18/23  5:25 PM   Specimen: Anterior Nasal Swab  Result Value Ref Range Status   SARS Coronavirus 2 by RT PCR NEGATIVE NEGATIVE Final    Comment: (NOTE) SARS-CoV-2 target nucleic acids are NOT DETECTED.  The SARS-CoV-2 RNA is generally detectable in upper respiratory specimens during the acute phase of infection. The lowest  concentration of SARS-CoV-2 viral copies this assay can detect is 138 copies/mL. A negative result does not preclude SARS-Cov-2 infection and should not be used as the sole basis for treatment or other patient management decisions. A negative result may occur with  improper specimen collection/handling, submission of specimen other than nasopharyngeal swab, presence of viral mutation(s) within the areas targeted by this assay, and inadequate number of viral copies(<138 copies/mL). A negative result must be combined with clinical observations, patient history, and epidemiological information. The expected result is Negative.  Fact Sheet for Patients:  BloggerCourse.com  Fact Sheet for Healthcare Providers:  SeriousBroker.it  This test is no t yet approved or cleared by the [place]  FDA and  has been authorized for detection and/or diagnosis of SARS-CoV-2 by FDA under an Emergency Use Authorization (EUA). This EUA will remain  in effect (meaning this test can be used) for the duration of the COVID-19 declaration under Section 564(b)(1) of the Act, 21 U.S.C.section 360bbb-3(b)(1), unless the authorization is terminated  or revoked sooner.       Influenza A by PCR NEGATIVE NEGATIVE Final   Influenza B by PCR  NEGATIVE NEGATIVE Final    Comment: (NOTE) The Xpert Xpress SARS-CoV-2/FLU/RSV plus assay is intended as an aid in the diagnosis of influenza from Nasopharyngeal swab specimens and should not be used as a sole basis for treatment. Nasal washings and aspirates are unacceptable for Xpert Xpress SARS-CoV-2/FLU/RSV testing.  Fact Sheet for Patients: BloggerCourse.com  Fact Sheet for Healthcare Providers: SeriousBroker.it  This test is not yet approved or cleared by the [place]  FDA and has been authorized for detection and/or diagnosis of SARS-CoV-2 by FDA under an Emergency Use Authorization (EUA). This EUA will remain in effect (meaning this test can be used) for the duration of the COVID-19 declaration under Section 564(b)(1) of the Act, 21 U.S.C. section 360bbb-3(b)(1), unless the authorization is terminated or revoked.     Resp Syncytial Virus by PCR NEGATIVE NEGATIVE Final    Comment: (NOTE) Fact Sheet for Patients: BloggerCourse.com  Fact Sheet for Healthcare Providers: SeriousBroker.it  This test is not yet approved or cleared by the [place]  FDA and has been authorized for detection and/or diagnosis of SARS-CoV-2 by FDA under an Emergency Use Authorization (EUA). This EUA will remain in effect (meaning this test can be used) for the duration of the COVID-19 declaration under Section 564(b)(1) of the Act, 21 U.S.C. section 360bbb-3(b)(1), unless the authorization is terminated or revoked.  Performed at [facility], [address] [place], [place] [address]   MRSA Next Gen by PCR, Nasal     Status: None   Collection Time: 06/18/23 10:06 PM   Specimen: Nasal Mucosa; Nasal Swab  Result Value Ref Range Status   MRSA by PCR Next Gen NOT DETECTED NOT DETECTED Final    Comment: (NOTE) The GeneXpert MRSA Assay (FDA approved for NASAL specimens only), is  one component of a comprehensive MRSA colonization surveillance program. It is not intended to diagnose MRSA infection nor to guide or monitor treatment for MRSA infections. Test performance is not FDA approved in patients less than 18 years old. Performed at [facility], [address] [place], [place] [address]   Culture, Respiratory w Gram Stain     Status: None   Collection Time: 06/21/23 11:37 AM   Specimen: Tracheal Aspirate; Respiratory  Result Value Ref Range Status   Specimen Description   Final    TRACHEAL ASPIRATE Performed at [facility], [address] [place], [place] [address]    Special Requests   Final    NONE Performed at [facility], [address] [place], [place] [address]    Gram Stain   Final    RARE WBC PRESENT, PREDOMINANTLY PMN NO ORGANISMS SEEN    Culture   Final    Normal respiratory flora-no Staph aureus or Pseudomonas seen Performed at [facility], [address], [place], [place] [address]    Report Status 06/24/2023 FINAL  Final  MRSA Next Gen by PCR, Nasal     Status: None   Collection Time: 06/21/23  9:54 PM   Specimen: Nasal Mucosa; Nasal Swab  Result Value Ref Range Status   MRSA by PCR Next Gen NOT DETECTED NOT DETECTED Final    Comment: (NOTE) The GeneXpert MRSA Assay (FDA approved for NASAL specimens only), is one component of a comprehensive MRSA colonization surveillance program. It is not intended to diagnose MRSA infection nor to guide or monitor treatment for MRSA infections. Test performance is not FDA approved in patients less than 67 years old. Performed at [facility], [address] [place], [place] [address]          Radiology Studies: CT CHEST WO CONTRAST Result Date: 06/24/2023 CLINICAL DATA:  History of non-small-cell lung cancer. Acute hypoxic respiratory failure. * Tracking Code: BO * EXAM: CT CHEST WITHOUT CONTRAST TECHNIQUE: Multidetector CT imaging of the  chest was performed following the standard protocol without IV contrast. RADIATION DOSE REDUCTION: This exam was performed according to the departmental dose-optimization program which includes automated exposure control, adjustment of the mA and/or kV according to patient size and/or use of iterative reconstruction technique. COMPARISON:  Chest radiograph dated 06/22/2023, CT chest dated 12/05/2022 FINDINGS: Cardiovascular: Right chest wall port terminates at the lower SVC. Normal heart size. No significant pericardial fluid/thickening. Great vessels are normal in course and caliber. Coronary artery calcifications and aortic atherosclerosis. Mediastinum/Nodes: Imaged thyroid gland without nodules meeting criteria for imaging follow-up by size. Normal esophagus. 10 mm right paratracheal lymph node (2:72), unchanged. New 16 mm right hilar lymph node (2:86) and increased size of 11 mm subcarinal (2:91) lymph node, previously 6 mm. Lungs/Pleura: The central airways are patent. Biapical paraseptal emphysema. Trace secretions within the trachea. Bilateral lower lobe subsegmental atelectasis. Irregular left upper lobe perihilar masslike consolidation with associated traction bronchiectasis, which may reflect posttreatment changes. Diffuse bilateral peribronchovascular consolidation and ground-glass opacities. No pneumothorax. Small bilateral pleural effusions. Upper abdomen: Normal. Musculoskeletal: No acute or abnormal lytic or blastic osseous lesions. Partially imaged subcutaneous soft tissue stranding of the right distal arm near the elbow. IMPRESSION: 1. Diffuse bilateral peribronchovascular consolidation and ground-glass opacities, which may reflect multifocal pneumonia or pulmonary edema. 2. Small bilateral pleural effusions. 3. Irregular left upper lobe perihilar masslike consolidation with associated traction bronchiectasis, which may reflect posttreatment changes. 4. New 16 mm right hilar lymph node and increased  size of 11 mm subcarinal lymph node, which may be reactive or metastatic. Recommend attention on follow-up. 5. Partially imaged subcutaneous soft tissue stranding of the right distal arm near the elbow, which may reflect cellulitis. Recommend correlation with physical exam. 6. Aortic Atherosclerosis (ICD10-I70.0) and Emphysema (ICD10-J43.9). Coronary artery calcifications. Assessment for potential risk factor modification, dietary therapy or pharmacologic therapy may be warranted, if clinically indicated. Electronically Signed   By: Limin  Xu M.D.   On: 06/24/2023 18:26        Scheduled Meds:  aspirin   81 mg Per Tube Daily   budesonide  (PULMICORT ) nebulizer solution  0.5 mg Nebulization BID   Chlorhexidine  Gluconate Cloth  6 each Topical Daily   enoxaparin  (LOVENOX ) injection  40 mg Subcutaneous Q24H   insulin  aspart  0-15 Units Subcutaneous Q4H   ipratropium-albuterol   3 mL Nebulization Q6H   metoprolol  tartrate  25 mg Per Tube BID   mouth rinse  15 mL Mouth Rinse Q2H   polyethylene glycol  17 g Oral Daily   sodium chloride  flush  10-40 mL Intracatheter Q12H   sodium chloride  HYPERTONIC  4 mL Nebulization TID   thiamine   100 mg Oral Daily   Continuous Infusions:   LOS: 8 days      Tiajuana Fluke, MD [facility]   If 7PM-7AM, please contact night-coverage  06/26/2023, 11:23 AM

## 2023-06-27 ENCOUNTER — Inpatient Hospital Stay

## 2023-06-27 DIAGNOSIS — R1310 Dysphagia, unspecified: Secondary | ICD-10-CM

## 2023-06-27 DIAGNOSIS — J189 Pneumonia, unspecified organism: Secondary | ICD-10-CM | POA: Diagnosis not present

## 2023-06-27 DIAGNOSIS — Z515 Encounter for palliative care: Secondary | ICD-10-CM | POA: Diagnosis not present

## 2023-06-27 DIAGNOSIS — J9601 Acute respiratory failure with hypoxia: Secondary | ICD-10-CM | POA: Diagnosis not present

## 2023-06-27 LAB — CBC WITH DIFFERENTIAL/PLATELET
Abs Immature Granulocytes: 0.08 10*3/uL — ABNORMAL HIGH (ref 0.00–0.07)
Basophils Absolute: 0.1 10*3/uL (ref 0.0–0.1)
Basophils Relative: 1 %
Eosinophils Absolute: 0.1 10*3/uL (ref 0.0–0.5)
Eosinophils Relative: 2 %
HCT: 33 % — ABNORMAL LOW (ref 39.0–52.0)
Hemoglobin: 10.8 g/dL — ABNORMAL LOW (ref 13.0–17.0)
Immature Granulocytes: 1 %
Lymphocytes Relative: 20 %
Lymphs Abs: 1.7 10*3/uL (ref 0.7–4.0)
MCH: 30.4 pg (ref 26.0–34.0)
MCHC: 32.7 g/dL (ref 30.0–36.0)
MCV: 93 fL (ref 80.0–100.0)
Monocytes Absolute: 1 10*3/uL (ref 0.1–1.0)
Monocytes Relative: 12 %
Neutro Abs: 5.3 10*3/uL (ref 1.7–7.7)
Neutrophils Relative %: 64 %
Platelets: 369 10*3/uL (ref 150–400)
RBC: 3.55 MIL/uL — ABNORMAL LOW (ref 4.22–5.81)
RDW: 15.3 % (ref 11.5–15.5)
WBC: 8.3 10*3/uL (ref 4.0–10.5)
nRBC: 0 % (ref 0.0–0.2)

## 2023-06-27 LAB — GLUCOSE, CAPILLARY
Glucose-Capillary: 131 mg/dL — ABNORMAL HIGH (ref 70–99)
Glucose-Capillary: 132 mg/dL — ABNORMAL HIGH (ref 70–99)
Glucose-Capillary: 130 mg/dL — ABNORMAL HIGH (ref 70–99)
Glucose-Capillary: 142 mg/dL — ABNORMAL HIGH (ref 70–99)
Glucose-Capillary: 161 mg/dL — ABNORMAL HIGH (ref 70–99)

## 2023-06-27 LAB — BASIC METABOLIC PANEL WITH GFR
Anion gap: 13 (ref 5–15)
BUN: 37 mg/dL — ABNORMAL HIGH (ref 8–23)
CO2: 24 mmol/L (ref 22–32)
Calcium: 9.1 mg/dL (ref 8.9–10.3)
Chloride: 104 mmol/L (ref 98–111)
Creatinine, Ser: 1.64 mg/dL — ABNORMAL HIGH (ref 0.61–1.24)
GFR, Estimated: 41 mL/min — ABNORMAL LOW (ref >60)
Glucose, Bld: 140 mg/dL — ABNORMAL HIGH (ref 70–99)
Potassium: 4.2 mmol/L (ref 3.5–5.1)
Sodium: 141 mmol/L (ref 135–145)

## 2023-06-27 MED ORDER — PROSOURCE TF20 ENFIT COMPATIBL EN LIQD
60.0000 mL | Freq: Two times a day (BID) | ENTERAL | Status: DC
  Administered 2023-06-27 – 2023-06-29 (×5): 60 mL
  Filled 2023-06-27: qty 60

## 2023-06-27 MED ORDER — FREE WATER
175.0000 mL | Status: DC
  Administered 2023-06-27 – 2023-06-29 (×11): 175 mL

## 2023-06-27 MED ORDER — HALOPERIDOL 2 MG PO TABS: 2.0000 mg | ORAL_TABLET | Freq: Four times a day (QID) | ORAL | Status: DC | PRN

## 2023-06-27 MED ORDER — HALOPERIDOL LACTATE 5 MG/ML IJ SOLN: 2.0000 mg | Freq: Four times a day (QID) | INTRAMUSCULAR | Status: DC | PRN

## 2023-06-27 MED ORDER — THIAMINE MONONITRATE 100 MG PO TABS
100.0000 mg | ORAL_TABLET | Freq: Every day | ORAL | Status: DC
  Filled 2023-06-27: qty 1

## 2023-06-27 MED ORDER — OSMOLITE 1.5 CAL PO LIQD
1000.0000 mL | ORAL | Status: DC
  Administered 2023-06-27 – 2023-06-29 (×2): 1000 mL

## 2023-06-27 MED ORDER — THIAMINE MONONITRATE 100 MG PO TABS
100.0000 mg | ORAL_TABLET | Freq: Every day | ORAL | Status: AC
  Administered 2023-06-28 – 2023-07-03 (×6): 100 mg
  Filled 2023-06-27 (×6): qty 1

## 2023-06-27 NOTE — Progress Notes (Signed)
 Nutrition Follow-up  DOCUMENTATION CODES:   Not applicable  INTERVENTION:  Initiate tube feeding via NG tube: Osmolite 1.5 at 50 ml/h (1200 ml per day) ** Initiate at 20 mL and titrate by 10 mL q6h  Prosource TF20 60 ml BID  Provides 2040 kcal, 115 gm protein, 914 ml free water  daily.  FWF 175 mL q4h. Total free water  1964 mL daily.   -Monitor Mg, K, and Phos and replete as needed  -100 mg thiamine  daily x 7 days -MVI with minerals daily  NUTRITION DIAGNOSIS:   Inadequate oral intake related to inability to eat as evidenced by NPO status. *still applicable  GOAL:   Patient will meet greater than or equal to 90% of their needs *not met- initiating TF  MONITOR:   Diet advancement, Labs, Weight trends, I & O's, Skin  REASON FOR ASSESSMENT:   Consult Enteral/tube feeding initiation and management  ASSESSMENT:   83 y/o male with h/o HLD, GERD, HTN, DM, small cell lung cancer diagnosed in 2018 status post chemoradiation in remission, CVA with hemiparesis, CKD, PAD, COPD and hepatitis C who is admitted with NSTEMI, PNA, septic shock and AKI. 4/16- extubated 4/17- s/p BSE- NPO 4/19- SLP cont. To recommend NPO 4/20- nasal cannula  Provider reached out to RD about ordering NG tube on this date. Labs reviewed. Meds reviewed. IVF d/c.    Diet Order:   Diet Order             Diet NPO time specified  Diet effective now                   EDUCATION NEEDS:   No education needs have been identified at this time  Skin:  Skin Assessment: Reviewed RN Assessment  Last BM:  06/23/23  Height:   Ht Readings from Last 1 Encounters:  06/22/23 6\' 3"  (1.905 m)    Weight:   Wt Readings from Last 1 Encounters:  06/27/23 87.8 kg    Ideal Body Weight:  89.1 kg  BMI:  Body mass index is 24.19 kg/m.  Estimated Nutritional Needs:   Kcal:  1950-2150  Protein:  105-120 grams  Fluid:  1.9-2.1 L  Laren Player, MPH, RD, LDN Clinical Dietitian Contact information  can be found at [employer].

## 2023-06-27 NOTE — Progress Notes (Signed)
 Daily Progress Note   Patient Name: Edwin Jones      Date: 06/27/2023 DOB: [birth year]  Age: 83 y.o. MRN#: [id number] Attending Physician: Tiajuana Fluke, MD Primary Care Physician: Macie Saxon, MD Admit Date: 06/18/2023  Reason for Consultation/Follow-up: Establishing goals of care  HPI/Brief Hospital Review: 83 y.o. 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   Admitted initially to the ICU treated for multifocal pneumonia with septic shock requiring mechanical ventilation from 4/11 - 4/16 Transferred to service to [facility] 4/17   Palliative medicine was consulted for assisting with goals of care conversations  Subjective: Extensive chart review has been completed prior to meeting patient including labs, vital signs, imaging, progress notes, orders, and available advanced directive documents from current and previous encounters.    Visited with Mr. Radabaugh earlier in the morning at his bedside.  He is awake, alert, remains nonverbal but able to nod his head appropriately to simple questions.  NGT in place does not appear to be contributing to discomfort.  Mr. Klaus able to nod appropriately to his understanding of reasoning for NGT placement.  Return to bedside later in the day when [name] visiting.  Again discussed purpose of NGT placement and initiation of tube feeds.  Overall discussed remaining hopeful that  artificial nutrition will boost Mr. Charnley chance of recovery with a long-term goal of returning to p.o. intake.  [name] again questions why NGT and tube feeding cannot be administered at home.  We discussed NGT placement being temporary and even with tube feedings high risk of aspiration remains.  Attempted to elicit goals of care.  [name] again shares her and Mr. Jossiah Smoak faith is very important to them and she continuously prays to God for his recovery.  Attempted to discuss CODE STATUS.  [name] quickly shares she is well aware of the difference between full code and DO NOT RESUSCITATE as these conversations were held every day while he was in ICU  and she became increasingly frustrated.  She shares she has been clear that she remains open to any and all interventions and treatments for Mr. Pringle.  Attempted to discuss the difference between quality versus quantity of life.  [name] shares she feels Mr. Sylvester Evert has an acceptable quality of life as he is able to spend the day with her looking outside and watching TV.  [name] shares that she is not at a place to discussed or consider that Mr. Teuscher may not fully recover.  Answered and addressed all questions and concerns.  PMT to step away from daily visits as goals and plans remain clear.  PMT phone number provided to [name] and encouraged to reach out for needs or concerns.  PMT will follow peripherally, please reengage if patient or family become more receptive to goals of care conversations or plans of care shift.  Care plan was discussed with primary team  Thank you for allowing the Palliative Medicine Team to assist in the care of this patient.  Total time:  50 minutes  Time spent includes: Detailed review of medical records (labs, imaging, vital signs), medically appropriate exam (mental status, respiratory, cardiac, skin), discussed with treatment team, counseling and educating patient, family and staff, documenting  clinical information, medication management and coordination of care.  [name], DNP, AGNP-C Palliative Medicine   Please contact Palliative Medicine Team phone at [phone] for questions and concerns.

## 2023-06-27 NOTE — Progress Notes (Signed)
 PROGRESS NOTE    Edwin Jones  ZOX:[id number] DOB: September 28, 1940 DOA: 06/18/2023 PCP: Macie Saxon, MD    Brief Narrative:  83 year old male patient with a past medical history of limited small cell lung cancer diagnosed in 2018 status post chemoradiation in remission with recent suspicion for recurrence was 4 or FDG avid on PET scan done in late 2024 presenting to [facility] on 0/11 with acute hypoxic respiratory failure secondary to community-acquired pneumonia complicated by septic shock.   4/18: Care transferred to [facility]   Assessment & Plan:   Principal Problem:   Acute hypoxic respiratory failure (HCC)   #Acute respiratory failure  #Pneumonia #Aspiration risk # Septic shock, resolved Patient required endotracheal intubation.  Was successfully extubated on 4/17.  Remained stable on 4 to 6 L nasal cannula.  Care transferred to [facility] service. 4/20: Weaned to 3 L nasal cannula Plan: Completed course of antibiotics Monitor vitals and fever curve Continue aggressive pulmonary toileting RD following closely Wean oxygen as tolerated  #Acute metabolic encephalopathy  In the setting of septic shock and prolonged ventilatory support.  Mental status remains poor.  Inability to tolerate p.o. intake or p.o. medications.  Reevaluation by SLP on 4/19.  Continue to recommend n.p.o. due to mentation.  RD engaged 4/20 Plan: Dobbhoff placement today Initiation of tube feeds.  Discussed with RD   #NSTEMI  Echo 06/19/23: EF 50 to 55%; grade I diastolic dysfunction; mild mitral valve regurgitation; trivial aortic valve regurgitation  High-sensitivity troponin peaked at 6200.  Completed 72 hours of heparin  GTT.  No shock physiology noted.  Cardiology not planning any inpatient intervention.   #Acute kidney injury likely secondary to ATN #Hypomagnesia  Creatinine improving.  Suspect ATN in the setting of respiratory failure and acute illness Daily labs Avoid nephrotoxins Optimize  electrolytes  #Type II diabetes mellitus  - CBG's q4hrs  - SSI  - Long acting insulin  discontinued for now  - Target CBG range 140 to 180 - Follow hypo/hyperglycemic protocol   # History of small cell lung cancer status post chemoradiation in 2018 in remission now with suspicion of recurrence.  CT chest with concern for cancer recurrence.  Case discussed with pulmonology who discussed with patient's family.  Patient will likely need repeat diagnostic workup which will involve a bronchoscopy with likely EBUS.  Pulmonology to coordinate for timing.  DVT prophylaxis: SQ Lovenox  Code Status: Full Family Communication: Daughter and spouse at bedside 4/19 Disposition Plan: Status is: Inpatient Remains inpatient appropriate because: Multiple acute issues as above   Level of care: Telemetry Medical  Consultants:  Cardiology, signed off Palliative care  Procedures:  Endotracheal intubation  Antimicrobials:   Subjective: Seen and examined.  Oxygen stable at 3 L.  Energy level appears improved this morning.  Still unable to participate in interview.  Objective: Vitals:   06/27/23 0336 06/27/23 0347 06/27/23 0453 06/27/23 0753  BP:  128/68  (!) 140/75  Pulse:  78  76  Resp:  20    Temp:  98.2 F (36.8 C)  98.5 F (36.9 C)  TempSrc:  Oral    SpO2: 100% 98%  100%  Weight:   87.8 kg   Height:        Intake/Output Summary (Last 24 hours) at 06/27/2023 1045 Last data filed at 06/27/2023 0453 Gross per 24 hour  Intake --  Output 1100 ml  Net -1100 ml   Filed Weights   06/25/23 0500 06/26/23 0457 06/27/23 0453  Weight: 88.1 kg 90.5 kg 87.8  kg    Examination:  General exam: Appears fatigued Respiratory system: Bibasilar crackles.  Overall clear.  Normal work of breathing.  3 L Cardiovascular system: S1-2, RRR, no murmurs, no pedal edema Gastrointestinal system: Soft, NT/ND, normal bowel sounds Central nervous system: Alert.  Unable to assess orientation Extremities:  Markedly decreased power bilaterally Skin: No rashes, lesions or ulcers Psychiatry: Unable to assess    Data Reviewed: I have personally reviewed following labs and imaging studies  CBC: Recent Labs  Lab 06/23/23 0457 06/24/23 0508 06/25/23 0525 06/26/23 0236 06/27/23 0815  WBC 12.8* 13.3* 11.8* 9.6 8.3  NEUTROABS  --   --   --   --  5.3  HGB 10.2* 10.9* 11.0* 10.7* 10.8*  HCT 31.1* 32.4* 33.2* 32.1* 33.0*  MCV 94.5 91.5 94.3 93.0 93.0  PLT 322 357 344 367 369   Basic Metabolic Panel: Recent Labs  Lab 06/21/23 0450 06/22/23 0452 06/23/23 0457 06/24/23 0508 06/25/23 0525 06/26/23 0236 06/27/23 0815  NA 136 138 139 141 141 141 141  K 3.7 4.2 4.1 4.3 4.2 4.2 4.2  CL 106 109 108 105 103 103 104  CO2 23 23 23 28 25 26 24   GLUCOSE 247* 207* 143* 91 116* 114* 140*  BUN 31* 33* 38* 43* 41* 41* 37*  CREATININE 1.61* 1.65* 1.39* 1.63* 1.40* 1.50* 1.64*  CALCIUM 7.8* 8.0* 8.1* 8.7* 8.6* 9.0 9.1  MG 2.0 1.9 1.9 1.9 2.1  --   --   PHOS 1.8* 2.1* 3.5 3.5 3.6  --   --    GFR: Estimated Creatinine Clearance: 40.8 mL/min (A) (by C-G formula based on SCr of 1.64 mg/dL (H)). Liver Function Tests: Recent Labs  Lab 06/21/23 0450  ALBUMIN  2.0*   No results for input(s): "LIPASE", "AMYLASE" in the last 168 hours. No results for input(s): "AMMONIA" in the last 168 hours. Coagulation Profile: No results for input(s): "INR", "PROTIME" in the last 168 hours.  Cardiac Enzymes: No results for input(s): "CKTOTAL", "CKMB", "CKMBINDEX", "TROPONINI" in the last 168 hours. BNP (last 3 results) No results for input(s): "PROBNP" in the last 8760 hours. HbA1C: No results for input(s): "HGBA1C" in the last 72 hours. CBG: Recent Labs  Lab 06/26/23 1631 06/26/23 1938 06/26/23 2313 06/27/23 0344 06/27/23 0758  GLUCAP 125* 110* 107* 131* 132*   Lipid Profile: No results for input(s): "CHOL", "HDL", "LDLCALC", "TRIG", "CHOLHDL", "LDLDIRECT" in the last 72 hours. Thyroid Function  Tests: No results for input(s): "TSH", "T4TOTAL", "FREET4", "T3FREE", "THYROIDAB" in the last 72 hours. Anemia Panel: No results for input(s): "VITAMINB12", "FOLATE", "FERRITIN", "TIBC", "IRON", "RETICCTPCT" in the last 72 hours. Sepsis Labs: Recent Labs  Lab 06/20/23 1445  LATICACIDVEN 1.6    Recent Results (from the past 240 hours)  Culture, blood (Routine x 2)     Status: None   Collection Time: 06/18/23  5:18 PM   Specimen: BLOOD  Result Value Ref Range Status   Specimen Description BLOOD BLOOD RIGHT FOREARM  Final   Special Requests   Final    BOTTLES DRAWN AEROBIC AND ANAEROBIC Blood Culture results may not be optimal due to an inadequate volume of blood received in culture bottles   Culture   Final    NO GROWTH 5 DAYS Performed at [facility], [address] [place], [place] [address]    Report Status 06/23/2023 FINAL  Final  Culture, blood (Routine x 2)     Status: Abnormal   Collection Time: 06/18/23  5:18 PM  Specimen: BLOOD  Result Value Ref Range Status   Specimen Description   Final    BLOOD BLOOD RIGHT ARM Performed at [facility], [address] [place], [place] [address]    Special Requests   Final    BOTTLES DRAWN AEROBIC AND ANAEROBIC Blood Culture results may not be optimal due to an inadequate volume of blood received in culture bottles Performed at [facility], [address] [place], [place] [address]    Culture  Setup Time   Final    GRAM POSITIVE COCCI AEROBIC BOTTLE ONLY CRITICAL RESULT CALLED TO, READ BACK BY AND VERIFIED WITH: Theora Flair PATEL @1102  06/19/23 SKY    Culture (A)  Final    STAPHYLOCOCCUS HOMINIS THE SIGNIFICANCE OF ISOLATING THIS ORGANISM FROM A SINGLE SET OF BLOOD CULTURES WHEN MULTIPLE SETS ARE DRAWN IS UNCERTAIN. PLEASE NOTIFY THE MICROBIOLOGY DEPARTMENT WITHIN ONE WEEK IF SPECIATION AND SENSITIVITIES ARE REQUIRED. Performed at [facility], [address] [place], [place] [address]     Report Status 06/21/2023 FINAL  Final  Blood Culture ID Panel (Reflexed)     Status: Abnormal   Collection Time: 06/18/23  5:18 PM  Result Value Ref Range Status   Enterococcus faecalis NOT DETECTED NOT DETECTED Final   Enterococcus Faecium NOT DETECTED NOT DETECTED Final   Listeria monocytogenes NOT DETECTED NOT DETECTED Final   Staphylococcus species DETECTED (A) NOT DETECTED Final    Comment: CRITICAL RESULT CALLED TO, READ BACK BY AND VERIFIED WITH: Theora Flair PATEL @ 1102 06/19/23 SKY    Staphylococcus aureus (BCID) NOT DETECTED NOT DETECTED Final   Staphylococcus epidermidis NOT DETECTED NOT DETECTED Final   Staphylococcus lugdunensis NOT DETECTED NOT DETECTED Final   Streptococcus species NOT DETECTED NOT DETECTED Final   Streptococcus agalactiae NOT DETECTED NOT DETECTED Final   Streptococcus pneumoniae NOT DETECTED NOT DETECTED Final   Streptococcus pyogenes NOT DETECTED NOT DETECTED Final   A.calcoaceticus-baumannii NOT DETECTED NOT DETECTED Final   Bacteroides fragilis NOT DETECTED NOT DETECTED Final   Enterobacterales NOT DETECTED NOT DETECTED Final   Enterobacter cloacae complex NOT DETECTED NOT DETECTED Final   Escherichia coli NOT DETECTED NOT DETECTED Final   Klebsiella aerogenes NOT DETECTED NOT DETECTED Final   Klebsiella oxytoca NOT DETECTED NOT DETECTED Final   Klebsiella pneumoniae NOT DETECTED NOT DETECTED Final   Proteus species NOT DETECTED NOT DETECTED Final   Salmonella species NOT DETECTED NOT DETECTED Final   Serratia marcescens NOT DETECTED NOT DETECTED Final   Haemophilus influenzae NOT DETECTED NOT DETECTED Final   Neisseria meningitidis NOT DETECTED NOT DETECTED Final   Pseudomonas aeruginosa NOT DETECTED NOT DETECTED Final   Stenotrophomonas maltophilia NOT DETECTED NOT DETECTED Final   Candida albicans NOT DETECTED NOT DETECTED Final   Candida auris NOT DETECTED NOT DETECTED Final   Candida glabrata NOT DETECTED NOT DETECTED Final   Candida krusei NOT  DETECTED NOT DETECTED Final   Candida parapsilosis NOT DETECTED NOT DETECTED Final   Candida tropicalis NOT DETECTED NOT DETECTED Final   Cryptococcus neoformans/gattii NOT DETECTED NOT DETECTED Final    Comment: Performed at [facility], [address] [place], [place] [address]  Resp panel by RT-PCR (RSV, Flu A&B, Covid) Anterior Nasal Swab     Status: None   Collection Time: 06/18/23  5:25 PM   Specimen: Anterior Nasal Swab  Result Value Ref Range Status   SARS Coronavirus 2 by RT PCR NEGATIVE NEGATIVE Final    Comment: (NOTE) SARS-CoV-2 target  nucleic acids are NOT DETECTED.  The SARS-CoV-2 RNA is generally detectable in upper respiratory specimens during the acute phase of infection. The lowest concentration of SARS-CoV-2 viral copies this assay can detect is 138 copies/mL. A negative result does not preclude SARS-Cov-2 infection and should not be used as the sole basis for treatment or other patient management decisions. A negative result may occur with  improper specimen collection/handling, submission of specimen other than nasopharyngeal swab, presence of viral mutation(s) within the areas targeted by this assay, and inadequate number of viral copies(<138 copies/mL). A negative result must be combined with clinical observations, patient history, and epidemiological information. The expected result is Negative.  Fact Sheet for Patients:  BloggerCourse.com  Fact Sheet for Healthcare Providers:  SeriousBroker.it  This test is no t yet approved or cleared by the [place]  FDA and  has been authorized for detection and/or diagnosis of SARS-CoV-2 by FDA under an Emergency Use Authorization (EUA). This EUA will remain  in effect (meaning this test can be used) for the duration of the COVID-19 declaration under Section 564(b)(1) of the Act, 21 U.S.C.section 360bbb-3(b)(1), unless the authorization is terminated   or revoked sooner.       Influenza A by PCR NEGATIVE NEGATIVE Final   Influenza B by PCR NEGATIVE NEGATIVE Final    Comment: (NOTE) The Xpert Xpress SARS-CoV-2/FLU/RSV plus assay is intended as an aid in the diagnosis of influenza from Nasopharyngeal swab specimens and should not be used as a sole basis for treatment. Nasal washings and aspirates are unacceptable for Xpert Xpress SARS-CoV-2/FLU/RSV testing.  Fact Sheet for Patients: BloggerCourse.com  Fact Sheet for Healthcare Providers: SeriousBroker.it  This test is not yet approved or cleared by the [place]  FDA and has been authorized for detection and/or diagnosis of SARS-CoV-2 by FDA under an Emergency Use Authorization (EUA). This EUA will remain in effect (meaning this test can be used) for the duration of the COVID-19 declaration under Section 564(b)(1) of the Act, 21 U.S.C. section 360bbb-3(b)(1), unless the authorization is terminated or revoked.     Resp Syncytial Virus by PCR NEGATIVE NEGATIVE Final    Comment: (NOTE) Fact Sheet for Patients: BloggerCourse.com  Fact Sheet for Healthcare Providers: SeriousBroker.it  This test is not yet approved or cleared by the [place]  FDA and has been authorized for detection and/or diagnosis of SARS-CoV-2 by FDA under an Emergency Use Authorization (EUA). This EUA will remain in effect (meaning this test can be used) for the duration of the COVID-19 declaration under Section 564(b)(1) of the Act, 21 U.S.C. section 360bbb-3(b)(1), unless the authorization is terminated or revoked.  Performed at [facility], [address] [place], [place] [address]   MRSA Next Gen by PCR, Nasal     Status: None   Collection Time: 06/18/23 10:06 PM   Specimen: Nasal Mucosa; Nasal Swab  Result Value Ref Range Status   MRSA by PCR Next Gen NOT DETECTED NOT DETECTED  Final    Comment: (NOTE) The GeneXpert MRSA Assay (FDA approved for NASAL specimens only), is one component of a comprehensive MRSA colonization surveillance program. It is not intended to diagnose MRSA infection nor to guide or monitor treatment for MRSA infections. Test performance is not FDA approved in patients less than 3 years old. Performed at [facility], [address] [place], [place] [address]   Culture, Respiratory w Gram Stain     Status: None   Collection Time: 06/21/23 11:37 AM   Specimen: Tracheal  Aspirate; Respiratory  Result Value Ref Range Status   Specimen Description   Final    TRACHEAL ASPIRATE Performed at [facility], [address], [place], [place] [address]    Special Requests   Final    NONE Performed at [facility], [address] [place], [place] [address]    Gram Stain   Final    RARE WBC PRESENT, PREDOMINANTLY PMN NO ORGANISMS SEEN    Culture   Final    Normal respiratory flora-no Staph aureus or Pseudomonas seen Performed at [facility], [address] [place] [address], [place], [place] [address]    Report Status 06/24/2023 FINAL  Final  MRSA Next Gen by PCR, Nasal     Status: None   Collection Time: 06/21/23  9:54 PM   Specimen: Nasal Mucosa; Nasal Swab  Result Value Ref Range Status   MRSA by PCR Next Gen NOT DETECTED NOT DETECTED Final    Comment: (NOTE) The GeneXpert MRSA Assay (FDA approved for NASAL specimens only), is one component of a comprehensive MRSA colonization surveillance program. It is not intended to diagnose MRSA infection nor to guide or monitor treatment for MRSA infections. Test performance is not FDA approved in patients less than 62 years old. Performed at [facility], [address], [place], [place] [address]          Radiology Studies: No results found.       Scheduled Meds:  aspirin   81 mg Per Tube Daily   budesonide  (PULMICORT ) nebulizer solution  0.5 mg  Nebulization BID   Chlorhexidine  Gluconate Cloth  6 each Topical Daily   enoxaparin  (LOVENOX ) injection  40 mg Subcutaneous Q24H   insulin  aspart  0-15 Units Subcutaneous Q4H   ipratropium-albuterol   3 mL Nebulization Q6H   metoprolol  tartrate  25 mg Per Tube BID   mouth rinse  15 mL Mouth Rinse Q2H   polyethylene glycol  17 g Oral Daily   sodium chloride  flush  10-40 mL Intracatheter Q12H   Continuous Infusions:   LOS: 9 days      Tiajuana Fluke, MD [facility]   If 7PM-7AM, please contact night-coverage  06/27/2023, 10:45 AM

## 2023-06-27 NOTE — Progress Notes (Signed)
 SLP Cancellation Note  Patient Details Name: Edwin Jones MRN: [id number] DOB: [birth year]   Cancelled treatment:       Reason Eval/Treat Not Completed: Medical issues which prohibited therapy;Patient not medically ready (chart reviewed; consulted NSG in room w/ pt post NGT placement)  Met w/ pt and NSG in pt's room post NGT placement. No Family present for education. Noted pharyngeal Phlegm and suctioning- per NSG, ongoing this morning. Now has NGT for TFs, nutritional support.  Recommend ST services follow pt next 1-3 days post receiving NGT nutritional support for strength/energy for therapy. Recommend frequent oral care for hygiene and stimulation of swallowing. NSG agreed.   Darla Edward, MS, CCC-SLP Speech Language Pathologist Rehab Services; [facility] [phone] (ascom) Margrett Kalb 06/27/2023, 1:14 PM

## 2023-06-27 NOTE — Plan of Care (Signed)

## 2023-06-28 ENCOUNTER — Inpatient Hospital Stay

## 2023-06-28 DIAGNOSIS — J9601 Acute respiratory failure with hypoxia: Secondary | ICD-10-CM | POA: Diagnosis not present

## 2023-06-28 LAB — BASIC METABOLIC PANEL WITH GFR
Anion gap: 7 (ref 5–15)
BUN: 38 mg/dL — ABNORMAL HIGH (ref 8–23)
CO2: 30 mmol/L (ref 22–32)
Calcium: 9.1 mg/dL (ref 8.9–10.3)
Chloride: 106 mmol/L (ref 98–111)
Creatinine, Ser: 1.4 mg/dL — ABNORMAL HIGH (ref 0.61–1.24)
GFR, Estimated: 50 mL/min — ABNORMAL LOW (ref >60)
Glucose, Bld: 169 mg/dL — ABNORMAL HIGH (ref 70–99)
Potassium: 3.8 mmol/L (ref 3.5–5.1)
Sodium: 143 mmol/L (ref 135–145)

## 2023-06-28 LAB — CBC WITH DIFFERENTIAL/PLATELET
Abs Immature Granulocytes: 0.03 10*3/uL (ref 0.00–0.07)
Basophils Absolute: 0 10*3/uL (ref 0.0–0.1)
Basophils Relative: 0 %
Eosinophils Absolute: 0.1 10*3/uL (ref 0.0–0.5)
Eosinophils Relative: 2 %
HCT: 32.1 % — ABNORMAL LOW (ref 39.0–52.0)
Hemoglobin: 10.6 g/dL — ABNORMAL LOW (ref 13.0–17.0)
Immature Granulocytes: 1 %
Lymphocytes Relative: 15 %
Lymphs Abs: 1 10*3/uL (ref 0.7–4.0)
MCH: 30.5 pg (ref 26.0–34.0)
MCHC: 33 g/dL (ref 30.0–36.0)
MCV: 92.5 fL (ref 80.0–100.0)
Monocytes Absolute: 0.9 10*3/uL (ref 0.1–1.0)
Monocytes Relative: 13 %
Neutro Abs: 4.5 10*3/uL (ref 1.7–7.7)
Neutrophils Relative %: 69 %
Platelets: 353 10*3/uL (ref 150–400)
RBC: 3.47 MIL/uL — ABNORMAL LOW (ref 4.22–5.81)
RDW: 15.2 % (ref 11.5–15.5)
WBC: 6.5 10*3/uL (ref 4.0–10.5)
nRBC: 0 % (ref 0.0–0.2)

## 2023-06-28 LAB — GLUCOSE, CAPILLARY
Glucose-Capillary: 103 mg/dL — ABNORMAL HIGH (ref 70–99)
Glucose-Capillary: 129 mg/dL — ABNORMAL HIGH (ref 70–99)
Glucose-Capillary: 169 mg/dL — ABNORMAL HIGH (ref 70–99)
Glucose-Capillary: 175 mg/dL — ABNORMAL HIGH (ref 70–99)
Glucose-Capillary: 187 mg/dL — ABNORMAL HIGH (ref 70–99)
Glucose-Capillary: 200 mg/dL — ABNORMAL HIGH (ref 70–99)

## 2023-06-28 NOTE — Progress Notes (Signed)
 Speech Language Pathology Treatment: Dysphagia  Patient Details Name: Edwin Jones MRN: [id number] DOB: 1940-09-02 Today's Date: 06/28/2023 Time: 9147-8295 SLP Time Calculation (min) (ACUTE ONLY): 25 min  Assessment / Plan / Recommendation Clinical Impression  Pt seen for follow up dysphagia intervention. Pt with eyes open, nodding to indicate desire to complete PO trials. Pt afebrile, on 3L nasal canula. Oral care completed. Congested cough/breathing noted. Trials completed of ice chips and thin liquid via tsp/cup. Oral phase notable for reduced labial closure, minimal oral manipulation of ice, and anterior loss with cup sips. Delayed throat clear noted with trials of thin liquid via cup. Given continued oral impairments and indication of aspiration, recommend continued NPO. Pt now with NGT and able to receive alternative nutrition. SLP will continue to follow.    HPI HPI: Per critical care progress note, "Edwin Jones is an 83 year old African-American male with a history of type 2 diabetes, hypertension, small cell lung cancer, and COPD who presented to the ED via EMS with complaints of respiratory distress. History is obtained from EMS records as well as ED records as patient is currently intubated and sedated and there is no family at the bedside. Per ED records, EMS was called for respiratory distress. Upon EMS arrival, patient's oxygen saturation was 60%. Bag mask ventilation was started and patient was transported to the ED after an unsuccessful intubation attempt in the field. Upon ED arrival, patient had copious amounts of secretions in his oral cavity. Initial ED workup revealed multifocal pneumonia with sepsis. Patient was placed on BiPAP however he became combative and started coughing profusely and also had large amounts of secretions that filled up the BiPAP mask. His oxygen saturation dropped and he was emergently intubated. PCCM was consulted for further management." Pt intubated 4/11-7/16.  Per RN, spouse reported pt was ind with eating/drinking at baseline. Head CT 4/17: No CT evidence of acute intracranial abnormality.      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  Moderate chronic microvascular ischemic changes and parenchymal  volume loss.     Large right mastoid effusion. Recommend correlation for evidence of  mastoiditis. CXR 4/15: Patchy airspace disease in both lung bases, right greater than  left. Imaging features could be related to atelectasis or  infiltrate.      SLP Plan  Continue with current plan of care      Recommendations for follow up therapy are one component of a multi-disciplinary discharge planning process, led by the attending physician.  Recommendations may be updated based on patient status, additional functional criteria and insurance authorization.    Recommendations  Diet recommendations: NPO Medication Administration: Via alternative means                  Oral care QID;Oral care prior to ice chip/H20   None Dysphagia, oropharyngeal phase (R13.12)     Continue with current plan of care    Edwin Jaquari Reckner Clapp, MS, CCC-SLP Speech Language Pathologist Rehab Services; [facility] [phone] (ascom)   Edwin Jones  06/28/2023, 2:08 PM

## 2023-06-28 NOTE — Progress Notes (Signed)
 SLP F/UNote  Patient Details Name: Edwin Jones MRN: [id number] DOB: 1940/11/14   Cancelled treatment:       Reason Eval/Treat Not Completed:  (chart reviewed; met w/ Wife and answered questions, reviewed POC.)  Per Wife's request, met w/ her in the room to discuss pt's progress w/ his Dysphagia, then the POC moving forward. Answered her questions re: dysphagia and described his risk for aspiration/aspiration pneumonia based on his presentation during his most recent tx session(and prior tx sessions).   OF NOTE: Pt has a long-standing h/o CVA in 2019: "admitted on 05/06/17 with right facial droop and right sided weakness. He did not receive TPA secondary to concerns of potential brain metastasis. CTA brain done revealing demonstrating left M1 occlusion and patient underwent mechanical thrombectomy and ICA stenting. Follow up MRI/MRA brain showed restored patency of L-ICA and L-MCA with left basal ganglia infarct, small infarct in left amygdala and small number of scatterd tiny cortical infarcts in L-MCA territory.". On 05/17/17, "he was noted to have neurologic decline with worsening of cognition, non verbal state and difficulty with mobility.  CT head done revealing acute left basal ganglia with edema and regional mass effect.". ON 05/21/17, ST services completed assessment which indicated: "Pt presents with severe global aphasia, is largely nonverbal and is not able to demonstrate understanding of most spoken or written language.".   Discussed w/ Wife that dysphagia tx sessions are ongoing and a MBSS will be planned (most likely for objective assessment) later in the week. Wife initiated discussion of a "Gtube", but stated that would be discussed further post results of this week/MBSS. Wife agreed. She frequently stated she would be "taking him home w/ her" after his hospitalization.   ST services will f/u tomorrow w/ dysphagia tx. Wife agreed w/ no further questions. NSG and MD updated.      Darla Edward, MS, CCC-SLP Speech Language Pathologist Rehab Services; [facility] [phone] (ascom) Floris Neuhaus 06/28/2023, 4:21 PM

## 2023-06-28 NOTE — Plan of Care (Signed)

## 2023-06-28 NOTE — Progress Notes (Signed)
 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   4/18: Care transferred to [facility] 4/21: NGT placed on 4/20 for initiation of tube feeds.  Patient was tolerating however tube may have been dislodged overnight.  Coarse breath sounds and tube was found to be coiled up in the back of the throat.  First tube discontinued and new tube replaced with assistance of ICU charge RN.   Assessment & Plan:   Principal Problem:   Acute hypoxic respiratory failure (HCC)   #Acute respiratory failure  #Pneumonia #Aspiration risk # Septic shock, resolved Patient required endotracheal intubation.  Was successfully extubated on 4/17.  Remained stable on 4 to 6 L nasal cannula.  Care transferred to TRH service. 4/20: Weaned to 3 L nasal cannula Plan: Completed course of antibiotics, no indication to restart at this time Monitor vitals and fever curve Continue aggressive pulmonary toileting RD following closely Wean oxygen as tolerated, slowly improving.  Currently on 3 L  #Acute metabolic encephalopathy  In the setting of septic shock and prolonged ventilatory support.  Mental status remains poor.  Inability to tolerate p.o. intake or p.o. medications.  Reevaluation by SLP on 4/19.  Continue to recommend n.p.o. due to mentation.  RD engaged 4/20 DHT placed 4/20.  Was dislodged 4/21 and replaced. Plan: Restart tube feeds once placement is confirmed by KUB    #NSTEMI  Echo 06/19/23: EF 50 to 55%; grade I diastolic dysfunction; mild mitral valve regurgitation;  trivial aortic valve regurgitation  High-sensitivity troponin peaked at 6200.  Completed 72 hours of heparin  GTT.  No shock physiology noted.  Cardiology not planning any inpatient intervention.   #Acute kidney injury likely secondary to ATN #Hypomagnesia  Creatinine improving.  Suspect ATN in the setting of respiratory failure and acute illness Daily labs Avoid nephrotoxins Optimize electrolytes  #Type II diabetes mellitus  - CBG's q4hrs  - SSI  - Long acting insulin  discontinued for now  - Target CBG range 140 to 180 - Follow hypo/hyperglycemic protocol   # History of small cell lung cancer status post chemoradiation in 2018 in remission now with suspicion of recurrence.  CT chest with concern for cancer recurrence.  Case discussed with pulmonology who discussed with patient's family.  Patient will likely need repeat diagnostic workup which will involve a bronchoscopy with likely EBUS.  Pulmonology to coordinate for timing.  DVT prophylaxis: SQ Lovenox  Code Status: Full Family Communication: Daughter and spouse at bedside 4/19 Disposition Plan: Status is: Inpatient Remains inpatient appropriate because: Multiple acute issues as above   Level of care: Telemetry Medical  Consultants:  Cardiology, signed off Palliative care  Procedures:  Endotracheal intubation  Antimicrobials:   Subjective: Seen and examined.  Oxygen stable.  Energy level appears improved.  Able to verbalize slow one-word answers to questions.  Objective: Vitals:   06/27/23 1623 06/27/23 2010 06/27/23 2025 06/28/23 0350  BP: 120/62  131/85 113/61  Pulse: 74  (!) 108 75  Resp: 20  16 18   Temp: 98.1 F (36.7 C)  98.5 F (36.9 C) 99.1 F (37.3 C)  TempSrc: Axillary     SpO2: 96% 94% 97% 100%  Weight:      Height:        Intake/Output Summary (Last 24 hours) at 06/28/2023 1036 Last data filed at 06/28/2023 0601 Gross per 24 hour  Intake 876.66 ml  Output 200 ml  Net 676.66 ml   Filed  Weights   06/25/23 0500 06/26/23 0457 06/27/23 0453  Weight: 88.1 kg 90.5 kg 87.8 kg    Examination:  General exam: Fatigue ill-appearing Respiratory system: Bibasilar crackles.  Normal work of breathing.  3 L Cardiovascular system: S1-2, RRR, no murmurs, no pedal edema Gastrointestinal system: Soft, NT/ND, normal bowel sounds Central nervous system: Alert.  Unable to assess orientation Extremities: Markedly decreased power bilaterally Skin: No rashes, lesions or ulcers Psychiatry: Unable to assess    Data Reviewed: I have personally reviewed following labs and imaging studies  CBC: Recent Labs  Lab 06/24/23 0508 06/25/23 0525 06/26/23 0236 06/27/23 0815 06/28/23 0910  WBC 13.3* 11.8* 9.6 8.3 6.5  NEUTROABS  --   --   --  5.3 4.5  HGB 10.9* 11.0* 10.7* 10.8* 10.6*  HCT 32.4* 33.2* 32.1* 33.0* 32.1*  MCV 91.5 94.3 93.0 93.0 92.5  PLT 357 344 367 369 353   Basic Metabolic Panel: Recent Labs  Lab 06/22/23 0452 06/23/23 0457 06/24/23 0508 06/25/23 0525 06/26/23 0236 06/27/23 0815 06/28/23 0910  NA 138 139 141 141 141 141 143  K 4.2 4.1 4.3 4.2 4.2 4.2 3.8  CL 109 108 105 103 103 104 106  CO2 23 23 28 25 26 24 30   GLUCOSE 207* 143* 91 116* 114* 140* 169*  BUN 33* 38* 43* 41* 41* 37* 38*  CREATININE 1.65* 1.39* 1.63* 1.40* 1.50* 1.64* 1.40*  CALCIUM 8.0* 8.1* 8.7* 8.6* 9.0 9.1 9.1  MG 1.9 1.9 1.9 2.1  --   --   --   PHOS 2.1* 3.5 3.5 3.6  --   --   --    GFR: Estimated Creatinine Clearance: 47.8 mL/min (A) (by C-G formula based on SCr of 1.4 mg/dL (H)). Liver Function Tests: No results for input(s): "AST", "ALT", "ALKPHOS", "BILITOT", "PROT", "ALBUMIN " in the last 168 hours.  No results for input(s): "LIPASE", "AMYLASE" in the last 168 hours. No results for input(s): "AMMONIA" in the last 168 hours. Coagulation Profile: No results for input(s): "INR", "PROTIME" in the last 168 hours.  Cardiac Enzymes: No results for input(s): "CKTOTAL", "CKMB",  "CKMBINDEX", "TROPONINI" in the last 168 hours. BNP (last 3 results) No results for input(s): "PROBNP" in the last 8760 hours. HbA1C: No results for input(s): "HGBA1C" in the last 72 hours. CBG: Recent Labs  Lab 06/27/23 1616 06/27/23 2107 06/28/23 0033 06/28/23 0427 06/28/23 0801  GLUCAP 142* 161* 169* 187* 175*   Lipid Profile: No results for input(s): "CHOL", "HDL", "LDLCALC", "TRIG", "CHOLHDL", "LDLDIRECT" in the last 72 hours. Thyroid Function Tests: No results for input(s): "TSH", "T4TOTAL", "FREET4", "T3FREE", "THYROIDAB" in the last 72 hours. Anemia Panel: No results for input(s): "VITAMINB12", "FOLATE", "FERRITIN", "TIBC", "IRON", "RETICCTPCT" in the last 72 hours. Sepsis Labs: No results for input(s): "PROCALCITON", "LATICACIDVEN" in the last 168 hours.   Recent Results (from the past 240 hours)  Culture, blood (Routine x 2)     Status: None   Collection Time: 06/18/23  5:18 PM   Specimen: BLOOD  Result Value Ref Range Status   Specimen Description BLOOD BLOOD RIGHT FOREARM  Final   Special Requests   Final  BOTTLES DRAWN AEROBIC AND ANAEROBIC Blood Culture results may not be optimal due to an inadequate volume of blood received in culture bottles   Culture   Final    NO GROWTH 5 DAYS Performed at [facility], [address] [place], [place] [address]    Report Status 06/23/2023 FINAL  Final  Culture, blood (Routine x 2)     Status: Abnormal   Collection Time: 06/18/23  5:18 PM   Specimen: BLOOD  Result Value Ref Range Status   Specimen Description   Final    BLOOD BLOOD RIGHT ARM Performed at [facility], [address], [place], [place] [address]    Special Requests   Final    BOTTLES DRAWN AEROBIC AND ANAEROBIC Blood Culture results may not be optimal due to an inadequate volume of blood received in culture bottles Performed at [facility], [address], [place], [place] [address]    Culture  Setup Time   Final     GRAM POSITIVE COCCI AEROBIC BOTTLE ONLY CRITICAL RESULT CALLED TO, READ BACK BY AND VERIFIED WITH: Theora Flair PATEL @1102  06/19/23 SKY    Culture (A)  Final    STAPHYLOCOCCUS HOMINIS THE SIGNIFICANCE OF ISOLATING THIS ORGANISM FROM A SINGLE SET OF BLOOD CULTURES WHEN MULTIPLE SETS ARE DRAWN IS UNCERTAIN. PLEASE NOTIFY THE MICROBIOLOGY DEPARTMENT WITHIN ONE WEEK IF SPECIATION AND SENSITIVITIES ARE REQUIRED. Performed at [facility], [address] [place], [place] [address]    Report Status 06/21/2023 FINAL  Final  Blood Culture ID Panel (Reflexed)     Status: Abnormal   Collection Time: 06/18/23  5:18 PM  Result Value Ref Range Status   Enterococcus faecalis NOT DETECTED NOT DETECTED Final   Enterococcus Faecium NOT DETECTED NOT DETECTED Final   Listeria monocytogenes NOT DETECTED NOT DETECTED Final   Staphylococcus species DETECTED (A) NOT DETECTED Final    Comment: CRITICAL RESULT CALLED TO, READ BACK BY AND VERIFIED WITH: Theora Flair PATEL @ 1102 06/19/23 SKY    Staphylococcus aureus (BCID) NOT DETECTED NOT DETECTED Final   Staphylococcus epidermidis NOT DETECTED NOT DETECTED Final   Staphylococcus lugdunensis NOT DETECTED NOT DETECTED Final   Streptococcus species NOT DETECTED NOT DETECTED Final   Streptococcus agalactiae NOT DETECTED NOT DETECTED Final   Streptococcus pneumoniae NOT DETECTED NOT DETECTED Final   Streptococcus pyogenes NOT DETECTED NOT DETECTED Final   A.calcoaceticus-baumannii NOT DETECTED NOT DETECTED Final   Bacteroides fragilis NOT DETECTED NOT DETECTED Final   Enterobacterales NOT DETECTED NOT DETECTED Final   Enterobacter cloacae complex NOT DETECTED NOT DETECTED Final   Escherichia coli NOT DETECTED NOT DETECTED Final   Klebsiella aerogenes NOT DETECTED NOT DETECTED Final   Klebsiella oxytoca NOT DETECTED NOT DETECTED Final   Klebsiella pneumoniae NOT DETECTED NOT DETECTED Final   Proteus species NOT DETECTED NOT DETECTED Final   Salmonella species NOT  DETECTED NOT DETECTED Final   Serratia marcescens NOT DETECTED NOT DETECTED Final   Haemophilus influenzae NOT DETECTED NOT DETECTED Final   Neisseria meningitidis NOT DETECTED NOT DETECTED Final   Pseudomonas aeruginosa NOT DETECTED NOT DETECTED Final   Stenotrophomonas maltophilia NOT DETECTED NOT DETECTED Final   Candida albicans NOT DETECTED NOT DETECTED Final   Candida auris NOT DETECTED NOT DETECTED Final   Candida glabrata NOT DETECTED NOT DETECTED Final   Candida krusei NOT DETECTED NOT DETECTED Final   Candida parapsilosis NOT DETECTED NOT DETECTED Final   Candida tropicalis NOT DETECTED NOT DETECTED Final  Cryptococcus neoformans/gattii NOT DETECTED NOT DETECTED Final    Comment: Performed at [facility], [address] [place], [place] [address]  Resp panel by RT-PCR (RSV, Flu A&B, Covid) Anterior Nasal Swab     Status: None   Collection Time: 06/18/23  5:25 PM   Specimen: Anterior Nasal Swab  Result Value Ref Range Status   SARS Coronavirus 2 by RT PCR NEGATIVE NEGATIVE Final    Comment: (NOTE) SARS-CoV-2 target nucleic acids are NOT DETECTED.  The SARS-CoV-2 RNA is generally detectable in upper respiratory specimens during the acute phase of infection. The lowest concentration of SARS-CoV-2 viral copies this assay can detect is 138 copies/mL. A negative result does not preclude SARS-Cov-2 infection and should not be used as the sole basis for treatment or other patient management decisions. A negative result may occur with  improper specimen collection/handling, submission of specimen other than nasopharyngeal swab, presence of viral mutation(s) within the areas targeted by this assay, and inadequate number of viral copies(<138 copies/mL). A negative result must be combined with clinical observations, patient history, and epidemiological information. The expected result is Negative.  Fact Sheet for Patients:   BloggerCourse.com  Fact Sheet for Healthcare Providers:  SeriousBroker.it  This test is no t yet approved or cleared by the [place]  FDA and  has been authorized for detection and/or diagnosis of SARS-CoV-2 by FDA under an Emergency Use Authorization (EUA). This EUA will remain  in effect (meaning this test can be used) for the duration of the COVID-19 declaration under Section 564(b)(1) of the Act, 21 U.S.C.section 360bbb-3(b)(1), unless the authorization is terminated  or revoked sooner.       Influenza A by PCR NEGATIVE NEGATIVE Final   Influenza B by PCR NEGATIVE NEGATIVE Final    Comment: (NOTE) The Xpert Xpress SARS-CoV-2/FLU/RSV plus assay is intended as an aid in the diagnosis of influenza from Nasopharyngeal swab specimens and should not be used as a sole basis for treatment. Nasal washings and aspirates are unacceptable for Xpert Xpress SARS-CoV-2/FLU/RSV testing.  Fact Sheet for Patients: BloggerCourse.com  Fact Sheet for Healthcare Providers: SeriousBroker.it  This test is not yet approved or cleared by the [place]  FDA and has been authorized for detection and/or diagnosis of SARS-CoV-2 by FDA under an Emergency Use Authorization (EUA). This EUA will remain in effect (meaning this test can be used) for the duration of the COVID-19 declaration under Section 564(b)(1) of the Act, 21 U.S.C. section 360bbb-3(b)(1), unless the authorization is terminated or revoked.     Resp Syncytial Virus by PCR NEGATIVE NEGATIVE Final    Comment: (NOTE) Fact Sheet for Patients: BloggerCourse.com  Fact Sheet for Healthcare Providers: SeriousBroker.it  This test is not yet approved or cleared by the [place]  FDA and has been authorized for detection and/or diagnosis of SARS-CoV-2 by FDA under an Emergency Use  Authorization (EUA). This EUA will remain in effect (meaning this test can be used) for the duration of the COVID-19 declaration under Section 564(b)(1) of the Act, 21 U.S.C. section 360bbb-3(b)(1), unless the authorization is terminated or revoked.  Performed at [facility], [address] [place], [place] [address]   MRSA Next Gen by PCR, Nasal     Status: None   Collection Time: 06/18/23 10:06 PM   Specimen: Nasal Mucosa; Nasal Swab  Result Value Ref Range Status   MRSA by PCR Next Gen NOT DETECTED NOT DETECTED Final    Comment: (NOTE) The GeneXpert MRSA Assay (FDA approved for  NASAL specimens only), is one component of a comprehensive MRSA colonization surveillance program. It is not intended to diagnose MRSA infection nor to guide or monitor treatment for MRSA infections. Test performance is not FDA approved in patients less than 55 years old. Performed at [facility], [address] [place], [place] [address]   Culture, Respiratory w Gram Stain     Status: None   Collection Time: 06/21/23 11:37 AM   Specimen: Tracheal Aspirate; Respiratory  Result Value Ref Range Status   Specimen Description   Final    TRACHEAL ASPIRATE Performed at [facility], [address] [place], [place] [address]    Special Requests   Final    NONE Performed at [facility], [address] [place], [place] [address]    Gram Stain   Final    RARE WBC PRESENT, PREDOMINANTLY PMN NO ORGANISMS SEEN    Culture   Final    Normal respiratory flora-no Staph aureus or Pseudomonas seen Performed at [facility], [address] [place], [place] [address]    Report Status 06/24/2023 FINAL  Final  MRSA Next Gen by PCR, Nasal     Status: None   Collection Time: 06/21/23  9:54 PM   Specimen: Nasal Mucosa; Nasal Swab  Result Value Ref Range Status   MRSA by PCR Next Gen NOT DETECTED NOT DETECTED Final    Comment: (NOTE) The GeneXpert MRSA Assay (FDA  approved for NASAL specimens only), is one component of a comprehensive MRSA colonization surveillance program. It is not intended to diagnose MRSA infection nor to guide or monitor treatment for MRSA infections. Test performance is not FDA approved in patients less than 14 years old. Performed at [facility], [address] [place], [place] [address]          Radiology Studies: DG Abd 1 View Result Date: 06/27/2023 CLINICAL DATA:  83 year old male enteric tube placement. Lung cancer. EXAM: ABDOMEN - 1 VIEW COMPARISON:  06/18/2023. FINDINGS: AP view at 1114 hours. NG type tube has been removed and enteric feeding tube now is in place terminating in the left upper quadrant, level of the proximal gastric body. Right chest Port-A-Cath is visible. Stable visible chest and osseous structures. IMPRESSION: Enteric tube placed into the proximal stomach. Advancement would be necessary to allow for post pyloric transit. Electronically Signed   By: Marlise Simpers M.D.   On: 06/27/2023 11:18         Scheduled Meds:  aspirin   81 mg Per Tube Daily   budesonide  (PULMICORT ) nebulizer solution  0.5 mg Nebulization BID   Chlorhexidine  Gluconate Cloth  6 each Topical Daily   enoxaparin  (LOVENOX ) injection  40 mg Subcutaneous Q24H   feeding supplement (PROSource TF20)  60 mL Per Tube BID   free water   175 mL Per Tube Q4H   insulin  aspart  0-15 Units Subcutaneous Q4H   ipratropium-albuterol   3 mL Nebulization Q6H   metoprolol  tartrate  25 mg Per Tube BID   mouth rinse  15 mL Mouth Rinse Q2H   polyethylene glycol  17 g Oral Daily   sodium chloride  flush  10-40 mL Intracatheter Q12H   thiamine   100 mg Per Tube Daily   Continuous Infusions:  feeding supplement (OSMOLITE 1.5 CAL) 50 mL/hr at 06/28/23 0601     LOS: 10 days      Tiajuana Fluke, MD [facility]   If 7PM-7AM, please contact night-coverage  06/28/2023, 10:36 AM

## 2023-06-29 ENCOUNTER — Inpatient Hospital Stay
Admission: RE | Admit: 2023-06-29 | Discharge: 2023-06-29 | Disposition: A | Discharge status: Home / Self Care | Source: Ambulatory Visit

## 2023-06-29 DIAGNOSIS — J9601 Acute respiratory failure with hypoxia: Secondary | ICD-10-CM | POA: Diagnosis not present

## 2023-06-29 LAB — CBC WITH DIFFERENTIAL/PLATELET
Abs Immature Granulocytes: 0.03 10*3/uL (ref 0.00–0.07)
Basophils Absolute: 0 10*3/uL (ref 0.0–0.1)
Basophils Relative: 0 %
Eosinophils Absolute: 0.1 10*3/uL (ref 0.0–0.5)
Eosinophils Relative: 1 %
HCT: 31 % — ABNORMAL LOW (ref 39.0–52.0)
Hemoglobin: 10.4 g/dL — ABNORMAL LOW (ref 13.0–17.0)
Immature Granulocytes: 0 %
Lymphocytes Relative: 11 %
Lymphs Abs: 0.9 10*3/uL (ref 0.7–4.0)
MCH: 31.7 pg (ref 26.0–34.0)
MCHC: 33.5 g/dL (ref 30.0–36.0)
MCV: 94.5 fL (ref 80.0–100.0)
Monocytes Absolute: 1 10*3/uL (ref 0.1–1.0)
Monocytes Relative: 11 %
Neutro Abs: 6.7 10*3/uL (ref 1.7–7.7)
Neutrophils Relative %: 77 %
Platelets: 336 10*3/uL (ref 150–400)
RBC: 3.28 MIL/uL — ABNORMAL LOW (ref 4.22–5.81)
RDW: 15 % (ref 11.5–15.5)
WBC: 8.8 10*3/uL (ref 4.0–10.5)
nRBC: 0 % (ref 0.0–0.2)

## 2023-06-29 LAB — GLUCOSE, CAPILLARY
Glucose-Capillary: 158 mg/dL — ABNORMAL HIGH (ref 70–99)
Glucose-Capillary: 193 mg/dL — ABNORMAL HIGH (ref 70–99)
Glucose-Capillary: 200 mg/dL — ABNORMAL HIGH (ref 70–99)
Glucose-Capillary: 216 mg/dL — ABNORMAL HIGH (ref 70–99)
Glucose-Capillary: 230 mg/dL — ABNORMAL HIGH (ref 70–99)
Glucose-Capillary: 239 mg/dL — ABNORMAL HIGH (ref 70–99)

## 2023-06-29 LAB — BASIC METABOLIC PANEL WITH GFR
Anion gap: 8 (ref 5–15)
BUN: 37 mg/dL — ABNORMAL HIGH (ref 8–23)
CO2: 29 mmol/L (ref 22–32)
Calcium: 8.7 mg/dL — ABNORMAL LOW (ref 8.9–10.3)
Chloride: 102 mmol/L (ref 98–111)
Creatinine, Ser: 1.44 mg/dL — ABNORMAL HIGH (ref 0.61–1.24)
GFR, Estimated: 48 mL/min — ABNORMAL LOW (ref >60)
Glucose, Bld: 264 mg/dL — ABNORMAL HIGH (ref 70–99)
Potassium: 4.3 mmol/L (ref 3.5–5.1)
Sodium: 139 mmol/L (ref 135–145)

## 2023-06-29 MED ORDER — IPRATROPIUM-ALBUTEROL 0.5-2.5 (3) MG/3ML IN SOLN: 3.0000 mL | Freq: Four times a day (QID) | RESPIRATORY_TRACT | Status: DC

## 2023-06-29 MED ORDER — OSMOLITE 1.5 CAL PO LIQD
1000.0000 mL | ORAL | Status: AC
  Administered 2023-06-29 – 2023-07-04 (×6): 1000 mL

## 2023-06-29 MED ORDER — CITALOPRAM HYDROBROMIDE 20 MG PO TABS
20.0000 mg | ORAL_TABLET | Freq: Every day | ORAL | Status: DC
  Administered 2023-06-30: 20 mg
  Filled 2023-06-29: qty 1

## 2023-06-29 MED ORDER — CITALOPRAM HYDROBROMIDE 20 MG PO TABS
20.0000 mg | ORAL_TABLET | Freq: Every day | ORAL | Status: DC
  Administered 2023-06-29: 20 mg
  Filled 2023-06-29: qty 1

## 2023-06-29 MED ORDER — PROSOURCE TF20 ENFIT COMPATIBL EN LIQD
60.0000 mL | Freq: Every day | ENTERAL | Status: DC
  Administered 2023-06-30 – 2023-07-04 (×5): 60 mL
  Filled 2023-06-29: qty 60

## 2023-06-29 MED ORDER — INSULIN GLARGINE-YFGN 100 UNIT/ML ~~LOC~~ SOLN
5.0000 [IU] | SUBCUTANEOUS | Status: DC
Start: 2023-06-29 — End: 2023-07-02
  Administered 2023-06-29 – 2023-07-01 (×3): 5 [IU] via SUBCUTANEOUS
  Filled 2023-06-29 (×4): qty 0.05

## 2023-06-29 MED ORDER — FREE WATER
100.0000 mL | Status: DC
  Administered 2023-06-29 – 2023-07-04 (×28): 100 mL

## 2023-06-29 NOTE — Progress Notes (Signed)
 Physical Therapy Treatment Patient Details Name: Edwin Jones MRN: [id number] DOB: [birth year] Today's Date: 06/29/2023   History of Present Illness Pt is an 83 y/o M admitted on 06/18/23 after presenting to the ED with c/o respiratory distress. Pt required intubation, found to have multifocal PNA with sepsis. Pt also being treated for NSTEMI, septic shock. Pt extubated 06/23/23. PMH: DM2, HTN, small cell lung CA, COPD, Hep C, hypercholesterolemia, CVA with residual R sided deficits    PT Comments  Pt received in overlap with OT. Noted purewick malfunction with NSG assist with linens change. Total A+2 for bed mobility with ~ 3 min sitting EOB, Intermittent CGA to modA+1-2 due to R lateral lena due to chronic R sided weakness from prior CVA with inability to self correct as fatigue becomes evident. Total A+2 to return to supine and scooted up in bed. Time spent working on R shoulder mobility/ROM to address RUE swelling/edema. Notable elbow extensor tone limiting R elbow flexion. Mild to moderate tone with shoulder flexion and abduction. Positioned in elevated state in bed with mittens donned for pt safety and adequate RUE positioning. D/c recs remain appropriate.    If plan is discharge home, recommend the following: Two people to help with walking and/or transfers;Two people to help with bathing/dressing/bathroom;Assistance with feeding   Can travel by private vehicle     No  Equipment Recommendations  Other (comment) (TBD by next venue of care)    Recommendations for Other Services       Precautions / Restrictions Precautions Precautions: Fall Restrictions Weight Bearing Restrictions Per Provider Order: No     Mobility  Bed Mobility Overal bed mobility: Needs Assistance Bed Mobility: Supine to Sit     Supine to sit: Total assist, +2 for physical assistance, HOB elevated       Patient Response: Cooperative, Flat affect  Transfers                         Ambulation/Gait               General Gait Details: does not ambulate at baseline.   Stairs             Wheelchair Mobility     Tilt Bed Tilt Bed Patient Response: Cooperative, Flat affect  Modified Rankin (Stroke Patients Only)       Balance Overall balance assessment: Needs assistance Sitting-balance support: Feet supported, Bilateral upper extremity supported Sitting balance-Leahy Scale: Poor Sitting balance - Comments: Intermittent R lateral lean at EOB with back unsupported needing modA+1-2 for neutral sitting Postural control: Right lateral lean                                  Communication Communication Communication: Impaired Factors Affecting Communication: Reduced clarity of speech;Difficulty expressing self  Cognition Arousal: Alert Behavior During Therapy: Flat affect   PT - Cognitive impairments: History of cognitive impairments, Difficult to assess Difficult to assess due to: Impaired communication                     PT - Cognition Comments: nods yes or no to questions. Demos understanding of directions with physical responses Following commands: Intact Following commands impaired: Follows one step commands with increased time    Cueing Cueing Techniques: Verbal cues, Gestural cues, Tactile cues  Exercises Other Exercises Other Exercises: R shoulder/elbow PROM. x10 flexion, x10 elbow flex, x10  elbow extension    General Comments        Pertinent Vitals/Pain Pain Assessment Pain Assessment: Faces Faces Pain Scale: No hurt    Home Living                          Prior Function            PT Goals (current goals can now be found in the care plan section) Acute Rehab PT Goals Patient Stated Goal: get better, return to PLOF PT Goal Formulation: With family Time For Goal Achievement: 07/08/23 Potential to Achieve Goals: Fair Progress towards PT goals: Progressing toward goals     Frequency    Min 2X/week      PT Plan      Co-evaluation PT/OT/SLP Co-Evaluation/Treatment: Yes Reason for Co-Treatment: Complexity of the patient's impairments (multi-system involvement);Necessary to address cognition/behavior during functional activity;For patient/therapist safety PT goals addressed during session: Balance;Mobility/safety with mobility OT goals addressed during session: ADL's and self-care      AM-PAC PT "6 Clicks" Mobility   Outcome Measure  Help needed turning from your back to your side while in a flat bed without using bedrails?: Total Help needed moving from lying on your back to sitting on the side of a flat bed without using bedrails?: Total Help needed moving to and from a bed to a chair (including a wheelchair)?: Total Help needed standing up from a chair using your arms (e.g., wheelchair or bedside chair)?: Total Help needed to walk in hospital room?: Total Help needed climbing 3-5 steps with a railing? : Total 6 Click Score: 6    End of Session Equipment Utilized During Treatment: Oxygen Activity Tolerance: Patient tolerated treatment well Patient left: in bed;with call bell/phone within reach;with bed alarm set;Other (comment) (mits donned) Nurse Communication: Mobility status PT Visit Diagnosis: Other abnormalities of gait and mobility (R26.89);Muscle weakness (generalized) (M62.81)     Time: 2841-3244 PT Time Calculation (min) (ACUTE ONLY): 17 min  Charges:    $Therapeutic Exercise: 8-22 mins PT General Charges $$ ACUTE PT VISIT: 1 Visit                     Marc Senior. Fairly IV, PT, DPT Physical Therapist- [place]  [facility]  06/29/2023, 12:28 PM

## 2023-06-29 NOTE — Progress Notes (Signed)
 Nutrition Follow Up Note   DOCUMENTATION CODES:   Not applicable  INTERVENTION:   Osmolite 1.5@60ml /hr continuous + ProSource TF 20- Give 60ml daily via tube  Free water  flushes 30ml q4 hours to maintain tube patency   Regimen provides 2240kcal/day, 110g/day protein and 1654ml/day of free water .   Pt at refeed risk; recommend monitor potassium, magnesium  and phosphorus labs daily until stable  Daily weights   NUTRITION DIAGNOSIS:   Inadequate oral intake related to inability to eat as evidenced by NPO status. -ongoing   GOAL:   Patient will meet greater than or equal to 90% of their needs -met with tube feeds  MONITOR:   Diet advancement, Labs, Weight trends, I & O's, Skin  ASSESSMENT:   83 y/o male with h/o HLD, GERD, HTN, DM, small cell lung cancer diagnosed in 2018 status post chemoradiation in remission, CVA with hemiparesis, CKD, PAD, COPD and hepatitis C who is admitted with NSTEMI, PNA, septic shock and AKI.  Pt s/p NGT placement 4/20 (replaced 4/21).  Pt previously tolerating tube feeds via NGT; tube dislodged overnight 4/21 and was replaced. Pt seen by SLP and remains NPO. May need to consider G-tube placement if patient is unable to be placed on an oral diet and pending GOC. Pt remains at high refeed risk. Per chart, pt remains weight stable since admit.   Medications reviewed and include: aspirin , celexa , lovenox , insulin , thiamine   Labs reviewed: K 4.3 wnl, BUN 37(H), creat 1.44(H) Hgb 10.4(L), Hct 31.0(L) Cbgs- 216, 230, 200, 239 x 24 hrs   UOP-   Diet Order:   Diet Order             Diet NPO time specified  Diet effective now                  EDUCATION NEEDS:   No education needs have been identified at this time  Skin:  Skin Assessment: Reviewed RN Assessment  Last BM:  4/21  Height:   Ht Readings from Last 1 Encounters:  06/22/23 6\' 3"  (1.905 m)    Weight:   Wt Readings from Last 1 Encounters:  06/27/23 87.8 kg     Ideal Body Weight:  89.1 kg  BMI:  Body mass index is 24.19 kg/m.  Estimated Nutritional Needs:   Kcal:  2200-2500kcal/day  Protein:  110-125g/day  Fluid:  2.2-2.5L/day  Torrance Freestone MS, RD, LDN If unable to be reached, please send secure chat to "RD inpatient" available from 8:00a-4:00p daily

## 2023-06-29 NOTE — Progress Notes (Signed)
 Occupational Therapy Treatment Patient Details Name: Edwin Jones MRN: [id number] DOB: [birth year] Today's Date: 06/29/2023   History of present illness Pt is an 83 y/o M admitted on 06/18/23 after presenting to the ED with c/o respiratory distress. Pt required intubation, found to have multifocal PNA with sepsis. Pt also being treated for NSTEMI, septic shock. Pt extubated 06/23/23. PMH: DM2, HTN, small cell lung CA, COPD, Hep C, hypercholesterolemia, CVA with residual R sided deficits   OT comments  Upon entering the room, pt supine in bed. Pt noted to have had purewick malfunction and bed linens and gown are soiled. OT notified RN and PT arrived to room for co-treatment. Total A of 2 for bed mobility to sit on EOB. Min A - max A for sitting balance with R lean noted. Total A to change hospital gown while seated on EOB. He does tolerate seated position for ~ 7 minutes. Pt returning to supine with total A of 2 and rolled L <> R to adjust linens and positioning. Pt remains in room working with PT as therapist exits the room.       If plan is discharge home, recommend the following:  Two people to help with walking and/or transfers;Two people to help with bathing/dressing/bathroom   Equipment Recommendations  None recommended by OT       Precautions / Restrictions Precautions Precautions: Fall       Mobility Bed Mobility Overal bed mobility: Needs Assistance Bed Mobility: Supine to Sit, Sit to Supine     Supine to sit: Total assist, +2 for physical assistance, HOB elevated Sit to supine: Total assist, +2 for physical assistance        Transfers                         Balance Overall balance assessment: Needs assistance Sitting-balance support: Feet supported, Bilateral upper extremity supported Sitting balance-Leahy Scale: Poor   Postural control: Right lateral lean                                 ADL either performed or assessed with clinical  judgement   ADL Overall ADL's : Needs assistance/impaired                     Lower Body Dressing: Total assistance;Sitting/lateral leans Lower Body Dressing Details (indicate cue type and reason): to change hospital gown and hygiene                    Extremity/Trunk Assessment Upper Extremity Assessment Upper Extremity Assessment: Generalized weakness   Lower Extremity Assessment Lower Extremity Assessment: Generalized weakness        Vision Patient Visual Report: No change from baseline           Communication Communication Communication: Impaired Factors Affecting Communication: Reduced clarity of speech;Difficulty expressing self   Cognition Arousal: Alert Behavior During Therapy: Flat affect                                 Following commands: Intact Following commands impaired: Follows one step commands with increased time      Cueing   Cueing Techniques: Verbal cues, Gestural cues, Tactile cues             Pertinent Vitals/ Pain       Pain Assessment  Pain Assessment: Faces Faces Pain Scale: No hurt         Frequency  Min 2X/week        Progress Toward Goals  OT Goals(current goals can now be found in the care plan section)  Progress towards OT goals: Progressing toward goals         Co-evaluation    PT/OT/SLP Co-Evaluation/Treatment: Yes Reason for Co-Treatment: Complexity of the patient's impairments (multi-system involvement);Necessary to address cognition/behavior during functional activity;For patient/therapist safety PT goals addressed during session: Balance;Mobility/safety with mobility OT goals addressed during session: ADL's and self-care      AM-PAC OT "6 Clicks" Daily Activity     Outcome Measure   Help from another person eating meals?: Total Help from another person taking care of personal grooming?: Total Help from another person toileting, which includes using toliet, bedpan, or urinal?:  Total Help from another person bathing (including washing, rinsing, drying)?: Total Help from another person to put on and taking off regular upper body clothing?: Total Help from another person to put on and taking off regular lower body clothing?: Total 6 Click Score: 6    End of Session Equipment Utilized During Treatment: Oxygen  OT Visit Diagnosis: Other abnormalities of gait and mobility (R26.89);Muscle weakness (generalized) (M62.81);Hemiplegia and hemiparesis Hemiplegia - Right/Left: Right Hemiplegia - dominant/non-dominant: Dominant Hemiplegia - caused by: Cerebral infarction   Activity Tolerance Patient tolerated treatment well   Patient Left with call bell/phone within reach;with family/visitor present;in chair;with chair alarm set   Nurse Communication Mobility status        Time: 1047-1106 OT Time Calculation (min): 19 min  Charges: OT General Charges $OT Visit: 1 Visit OT Treatments $Self Care/Home Management : 8-22 mins  George Kinder, MS, OTR/L , CBIS ascom [phone]  06/29/23, 1:58 PM

## 2023-06-29 NOTE — Progress Notes (Signed)
 Speech Language Pathology Treatment: Dysphagia  Patient Details Name: Edwin Jones MRN: [id number] DOB: October 13, 1940 Today's Date: 06/29/2023 Time: 1120-1130 SLP Time Calculation (min) (ACUTE ONLY): 10 min  Assessment / Plan / Recommendation Clinical Impression  Pt seen for ongoing dysphagia management. Pt was awake, alert with bilateral mittens in place, NGT also in place. When offered ice chips pt shook his head "no" but with encouragement he opened his mouth - consenting to trials. Pt with slow oral response and no swallow appreciated at bedside with tactile palpation to neck. Wet respirations heard. When asked if he "had swallowed" pt shook his head "no." At this time, pt remains at high risk of aspiration. ST to continue to follow.    HPI HPI: Per critical care progress note, "Mr. Wojcicki is an 83 year old African-American male with a history of type 2 diabetes, hypertension, small cell lung cancer, and COPD who presented to the ED via EMS with complaints of respiratory distress. History is obtained from EMS records as well as ED records as patient is currently intubated and sedated and there is no family at the bedside. Per ED records, EMS was called for respiratory distress. Upon EMS arrival, patient's oxygen saturation was 60%. Bag mask ventilation was started and patient was transported to the ED after an unsuccessful intubation attempt in the field. Upon ED arrival, patient had copious amounts of secretions in his oral cavity. Initial ED workup revealed multifocal pneumonia with sepsis. Patient was placed on BiPAP however he became combative and started coughing profusely and also had large amounts of secretions that filled up the BiPAP mask. His oxygen saturation dropped and he was emergently intubated. PCCM was consulted for further management." Pt intubated 4/11-7/16. Per RN, spouse reported pt was ind with eating/drinking at baseline. Head CT 4/17: No CT evidence of acute intracranial  abnormality.      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  Moderate chronic microvascular ischemic changes and parenchymal  volume loss.     Large right mastoid effusion. Recommend correlation for evidence of  mastoiditis. CXR 4/15: Patchy airspace disease in both lung bases, right greater than  left. Imaging features could be related to atelectasis or  infiltrate.      SLP Plan  Continue with current plan of care;MBS      Recommendations for follow up therapy are one component of a multi-disciplinary discharge planning process, led by the attending physician.  Recommendations may be updated based on patient status, additional functional criteria and insurance authorization.    Recommendations  Diet recommendations: NPO Medication Administration: Via alternative means Compensations: Minimize environmental distractions;Slow rate;Small sips/bites Postural Changes and/or Swallow Maneuvers: Seated upright 90 degrees                  Oral care QID;Oral care prior to ice chip/H20   Frequent or constant Supervision/Assistance Dysphagia, oropharyngeal phase (R13.12)     Continue with current plan of care;MBS    Arush Gatliff B. Garlin Junker, M.S., CCC-SLP, Tree surgeon Certified Brain Injury Specialist [facility]  [facility] Rehabilitation Services Office [phone] Ascom [phone] Fax [phone]

## 2023-06-29 NOTE — Telephone Encounter (Unsigned)
 Copied from CRM [id number]. Topic: General - Other >> Jun 28, 2023 12:34 PM Eveleen Hinds B wrote: Reason for CRM: Wife called. Patient admitted into hospital. Would like to talk with Dr Assaker's office regarding tests and upcoming appt.

## 2023-06-29 NOTE — Telephone Encounter (Signed)
 I spoke with the patient's wife (DPR). She said she does not know if she wants him to have the Bronchoscopy at this time. She would like to talk to Dr. Lucina Sabal about the procedure.   Dr. Lucina Sabal,  I did let her know you are out of the office and will respond as soon as you can.

## 2023-06-29 NOTE — Inpatient Diabetes Management (Addendum)
 Inpatient Diabetes Program Recommendations  AACE/ADA: New Consensus Statement on Inpatient Glycemic Control  Target Ranges:  Prepandial:   less than 140 mg/dL      Peak postprandial:   less than 180 mg/dL (1-2 hours)      Critically ill patients:  140 - 180 mg/dL    Latest Reference Range & Units 06/28/23 08:01 06/28/23 12:01 06/28/23 16:51 06/28/23 19:51 06/29/23 00:04 06/29/23 04:22 06/29/23 09:07 06/29/23 11:19  Glucose-Capillary 70 - 99 mg/dL 409 (H) 811 (H) 914 (H) 200 (H) 239 (H) 200 (H) 230 (H) 216 (H)   Review of Glycemic Control  Diabetes history: DM2 Outpatient Diabetes medications: Amaryl  4 mg daily, Lantus  10-12 units daily Current orders for Inpatient glycemic control: Novolog  0-15 units Q4H  Inpatient Diabetes Program Recommendations:    Insulin : CBGs ranged from 200-239 mg/dl over the past 12 hours. May want to consider ordering Semglee  6 units Q24H.  Thanks, Beacher Limerick, RN, MSN, CDCES Diabetes Coordinator Inpatient Diabetes Program [phone] (Team Pager from 8am to 5pm)

## 2023-06-29 NOTE — TOC Progression Note (Signed)
 Transition of Care [facility]) - Progression Note    Patient Details  Name: Edwin Jones MRN: [id number] Date of Birth: 03/19/1940  [facility] of [facility] CM/SW Contact  Odilia Bennett, LCSW Phone Number: 06/29/2023, 1:16 PM  Clinical Narrative:   No family at bedside. CSW called wife, introduced role, and explained that therapy recommendations would be discussed. Wife is not interested in SNF placement but is agreeable to home health. She prefers [facility] as he worked with them last year. They have accepted referral for PT, OT, RN. Wife preferred to use his Medicare benefits for home health. She has been speaking to someone at the [facility] about getting a hospital bed. She is also asking for a trapeze bar, suction machine, wedge, and SCD's. CSW sent secure email to [facility] hospital liaison to inquire about these items. Wife said they already have a hoyer lift, BSC, shower chair, and wheelchair. No further concerns. CSW will continue to follow patient and his wife for support and facilitate return home once stable. Wife confirmed he would need ambulance transport at discharge and address on facesheet is correct.  Expected Discharge Plan and Services                                               Social Determinants of Health (SDOH) Interventions SDOH Screenings   Food Insecurity: Patient Unable To Answer (06/19/2023)  Housing: Patient Unable To Answer (06/19/2023)  Transportation Needs: Patient Unable To Answer (06/19/2023)  Utilities: Patient Unable To Answer (06/19/2023)  Social Connections: Patient Unable To Answer (06/19/2023)  Tobacco Use: Medium Risk (06/18/2023)    Readmission Risk Interventions     No data to display

## 2023-06-29 NOTE — Progress Notes (Signed)
 Wife came to visit the patient. She said the patient was drowsy and requested that we give the celexa  at night time instead of a.m.  MD notified and order changed

## 2023-06-29 NOTE — Progress Notes (Signed)
 PROGRESS NOTE    Edwin Jones  ZOX:[id number] DOB: [birth year] DOA: 06/18/2023 PCP: Macie Saxon, MD    Brief Narrative:  83 year old male patient with a past medical history of limited small cell lung cancer diagnosed in 2018 status post chemoradiation in remission with recent suspicion for recurrence was 4 or FDG avid on PET scan done in late 2024 presenting to [facility] on 0/11 with acute hypoxic respiratory failure secondary to community-acquired pneumonia complicated by septic shock.   4/18: Care transferred to [facility] 4/21: NGT placed on 4/20 for initiation of tube feeds.  Patient was tolerating however tube may have been dislodged overnight.  Coarse breath sounds and tube was found to be coiled up in the back of the throat.  First tube discontinued and new tube replaced with assistance of ICU charge RN.   Assessment & Plan:   Principal Problem:   Acute hypoxic respiratory failure (HCC)   #Acute respiratory failure  #Pneumonia #Aspiration risk # Septic shock, resolved Patient required endotracheal intubation.  Was successfully extubated on 4/17.  Remained stable on 4 to 6 L nasal cannula.  Care transferred to TRH service. 4/20: Weaned to 3 L nasal cannula Plan: Completed course of antibiotics, no indication to restart at this time Monitor vitals and fever curve Continue aggressive pulmonary toileting RT following closely Wean oxygen as tolerated, slowly improving.  Currently on 3 L  #Acute metabolic encephalopathy  In the setting of septic shock and prolonged ventilatory support.  Mental status remains poor.  Inability to tolerate p.o. intake or p.o. medications.  Reevaluation by SLP on 4/19.  Continue to recommend n.p.o. due to mentation.  RD engaged 4/20 DHT placed 4/20.  Was dislodged 4/21 and replaced. Plan: Appropriate placement confirmed by imaging.  Okay to continue tube feeds.  I suspect the patient will be unlikely to meet his nutritional goals via oral intake.  SLP  following and will likely perform modified barium swallow study in the next 1 to 2 days.  If it aligns with the family's goals of care the patient will likely need a long-term enteral feeding tube placement.  He remains at higher aspiration risk    #NSTEMI  Echo 06/19/23: EF 50 to 55%; grade I diastolic dysfunction; mild mitral valve regurgitation; trivial aortic valve regurgitation  High-sensitivity troponin peaked at 6200.  Completed 72 hours of heparin  GTT.  No shock physiology noted.  Cardiology not planning any inpatient intervention.   #Acute kidney injury likely secondary to ATN #Hypomagnesia  Creatinine improving.  Suspect ATN in the setting of respiratory failure and acute illness Daily labs Avoid nephrotoxins Optimize electrolytes  #Type II diabetes mellitus  Semglee  5 units daily Moderate sliding scale CBGs every 4 hours while on tube feeds   # History of small cell lung cancer status post chemoradiation in 2018 in remission now with suspicion of recurrence.  CT chest with concern for cancer recurrence.  Case discussed with pulmonology who discussed with patient's family.  Patient will likely need repeat diagnostic workup which will involve a bronchoscopy with likely EBUS.  Pulmonology planning bronchoscopy with biopsy/29.  If patient is still admitted we will do inpatient otherwise will need to be set up as outpatient.  Case discussed with Dr. Lucina Sabal  DVT prophylaxis: SQ Lovenox  Code Status: Full Family Communication: Daughter and spouse at bedside 4/19 Disposition Plan: Status is: Inpatient Remains inpatient appropriate because: Multiple acute issues as above   Level of care: Telemetry Medical  Consultants:  Cardiology, signed off Palliative care,  following peripherally  Procedures:  Endotracheal intubation  Antimicrobials:   Subjective: Seen and examined.  Oxygen stable.  Remains on 3 L.  Energy level appears improved.  Able to verbalize slow one-word  answers but unable to participate in interview beyond that  Objective: Vitals:   06/29/23 0248 06/29/23 0248 06/29/23 0250 06/29/23 0946  BP:  (!) 97/55 109/71 114/66  Pulse:  76 77   Resp:  16    Temp:  99.9 F (37.7 C)  99.2 F (37.3 C)  TempSrc:    Axillary  SpO2: 97% 97% 97% 98%  Weight:      Height:       No intake or output data in the 24 hours ending 06/29/23 1132  Filed Weights   06/25/23 0500 06/26/23 0457 06/27/23 0453  Weight: 88.1 kg 90.5 kg 87.8 kg    Examination:  General exam: Chronically ill-appearing Respiratory system: Crackles at bases.  Normal work of breathing.  3 L Cardiovascular system: S1-2, RRR, no murmurs, no pedal edema Gastrointestinal system: Soft, NT/ND, normal bowel sounds Central nervous system: Awake, lethargic.  Unable to assess orientation Extremities: Markedly decreased power bilaterally Skin: No rashes, lesions or ulcers Psychiatry: Unable to assess    Data Reviewed: I have personally reviewed following labs and imaging studies  CBC: Recent Labs  Lab 06/25/23 0525 06/26/23 0236 06/27/23 0815 06/28/23 0910 06/29/23 1024  WBC 11.8* 9.6 8.3 6.5 8.8  NEUTROABS  --   --  5.3 4.5 6.7  HGB 11.0* 10.7* 10.8* 10.6* 10.4*  HCT 33.2* 32.1* 33.0* 32.1* 31.0*  MCV 94.3 93.0 93.0 92.5 94.5  PLT 344 367 369 353 336   Basic Metabolic Panel: Recent Labs  Lab 06/23/23 0457 06/24/23 0508 06/25/23 0525 06/26/23 0236 06/27/23 0815 06/28/23 0910 06/29/23 1024  NA 139 141 141 141 141 143 139  K 4.1 4.3 4.2 4.2 4.2 3.8 4.3  CL 108 105 103 103 104 106 102  CO2 23 28 25 26 24 30 29   GLUCOSE 143* 91 116* 114* 140* 169* 264*  BUN 38* 43* 41* 41* 37* 38* 37*  CREATININE 1.39* 1.63* 1.40* 1.50* 1.64* 1.40* 1.44*  CALCIUM 8.1* 8.7* 8.6* 9.0 9.1 9.1 8.7*  MG 1.9 1.9 2.1  --   --   --   --   PHOS 3.5 3.5 3.6  --   --   --   --    GFR: Estimated Creatinine Clearance: 46.5 mL/min (A) (by C-G formula based on SCr of 1.44 mg/dL (H)). Liver  Function Tests: No results for input(s): "AST", "ALT", "ALKPHOS", "BILITOT", "PROT", "ALBUMIN " in the last 168 hours.  No results for input(s): "LIPASE", "AMYLASE" in the last 168 hours. No results for input(s): "AMMONIA" in the last 168 hours. Coagulation Profile: No results for input(s): "INR", "PROTIME" in the last 168 hours.  Cardiac Enzymes: No results for input(s): "CKTOTAL", "CKMB", "CKMBINDEX", "TROPONINI" in the last 168 hours. BNP (last 3 results) No results for input(s): "PROBNP" in the last 8760 hours. HbA1C: No results for input(s): "HGBA1C" in the last 72 hours. CBG: Recent Labs  Lab 06/28/23 1951 06/29/23 0004 06/29/23 0422 06/29/23 0907 06/29/23 1119  GLUCAP 200* 239* 200* 230* 216*   Lipid Profile: No results for input(s): "CHOL", "HDL", "LDLCALC", "TRIG", "CHOLHDL", "LDLDIRECT" in the last 72 hours. Thyroid Function Tests: No results for input(s): "TSH", "T4TOTAL", "FREET4", "T3FREE", "THYROIDAB" in the last 72 hours. Anemia Panel: No results for input(s): "VITAMINB12", "FOLATE", "FERRITIN", "TIBC", "IRON", "RETICCTPCT" in the last  72 hours. Sepsis Labs: No results for input(s): "PROCALCITON", "LATICACIDVEN" in the last 168 hours.   Recent Results (from the past 240 hours)  Culture, Respiratory w Gram Stain     Status: None   Collection Time: 06/21/23 11:37 AM   Specimen: Tracheal Aspirate; Respiratory  Result Value Ref Range Status   Specimen Description   Final    TRACHEAL ASPIRATE Performed at [facility], [address], [place], [place] [address]    Special Requests   Final    NONE Performed at [facility], [address] [place], [place] [address]    Gram Stain   Final    RARE WBC PRESENT, PREDOMINANTLY PMN NO ORGANISMS SEEN    Culture   Final    Normal respiratory flora-no Staph aureus or Pseudomonas seen Performed at [facility], [address] [place], [place] [address]    Report Status 06/24/2023 FINAL   Final  MRSA Next Gen by PCR, Nasal     Status: None   Collection Time: 06/21/23  9:54 PM   Specimen: Nasal Mucosa; Nasal Swab  Result Value Ref Range Status   MRSA by PCR Next Gen NOT DETECTED NOT DETECTED Final    Comment: (NOTE) The GeneXpert MRSA Assay (FDA approved for NASAL specimens only), is one component of a comprehensive MRSA colonization surveillance program. It is not intended to diagnose MRSA infection nor to guide or monitor treatment for MRSA infections. Test performance is not FDA approved in patients less than 55 years old. Performed at [facility], [address], [place], [place] [address]          Radiology Studies: DG Abd 1 View Result Date: 06/28/2023 CLINICAL DATA:  Feeding tube repositioning EXAM: ABDOMEN - 1 VIEW COMPARISON:  06/28/2023 at 10:29 a.m. FINDINGS: The feeding tube tip is in the stomach antrum, close to the pyloric channel. Unremarkable bowel gas pattern.  Mild lower lumbar spondylosis IMPRESSION: 1. Feeding tube tip is in the stomach antrum, close to the pyloric channel. Electronically Signed   By: Freida Jes M.D.   On: 06/28/2023 15:20   DG Abd 1 View Result Date: 06/28/2023 CLINICAL DATA:  Feeding tube assessment/positioning EXAM: ABDOMEN - 1 VIEW COMPARISON:  06/27/2023 FINDINGS: The feeding tube extends into the stomach right undergoes a 180 degree turn and terminates in the vicinity of the stomach fundus. Linear subsegmental atelectasis or scarring at the lung bases. Lower thoracic spondylosis. Visualized upper abdominal bowel gas pattern unremarkable. IMPRESSION: 1. Feeding tube terminates in the vicinity of the stomach fundus. Electronically Signed   By: Freida Jes M.D.   On: 06/28/2023 15:18   DG Chest Port 1 View Result Date: 06/28/2023 CLINICAL DATA:  Hypoxia. EXAM: PORTABLE CHEST 1 VIEW COMPARISON:  06/22/2023.  Chest CT dated 06/22/2023 FINDINGS: Normal sized heart. Mildly tortuous aorta. Stable right jugular  porta catheter with its tip in the superior vena cava. Mild scattered patchy densities in the inferior half of each lung with overall improvement bilaterally. Minimal right pleural effusion. Thoracic spine degenerative changes. Feeding tube coiled in the neck with its tip in the mid neck. IMPRESSION: 1. Feeding tube coiled in the neck with its tip in the mid neck. Recommend removal and replacement. 2. Mild scattered patchy densities in the inferior half of each lung with overall improvement bilaterally. This most likely represents a combination of improving pneumonia and atelectasis. 3. Minimal right pleural effusion. These results will be called to the ordering clinician or  representative by the Radiologist Assistant, and communication documented in the PACS or [employer]. Electronically Signed   By: Catherin Closs M.D.   On: 06/28/2023 12:14         Scheduled Meds:  aspirin   81 mg Per Tube Daily   budesonide  (PULMICORT ) nebulizer solution  0.5 mg Nebulization BID   Chlorhexidine  Gluconate Cloth  6 each Topical Daily   citalopram   20 mg Per Tube Daily   enoxaparin  (LOVENOX ) injection  40 mg Subcutaneous Q24H   feeding supplement (PROSource TF20)  60 mL Per Tube BID   free water   175 mL Per Tube Q4H   insulin  aspart  0-15 Units Subcutaneous Q4H   ipratropium-albuterol   3 mL Nebulization Q6H   metoprolol  tartrate  25 mg Per Tube BID   mouth rinse  15 mL Mouth Rinse Q2H   sodium chloride  flush  10-40 mL Intracatheter Q12H   thiamine   100 mg Per Tube Daily   Continuous Infusions:  feeding supplement (OSMOLITE 1.5 CAL) 50 mL/hr at 06/28/23 0601     LOS: 11 days      Tiajuana Fluke, MD [facility]   If 7PM-7AM, please contact night-coverage  06/29/2023, 11:32 AM

## 2023-06-29 NOTE — Plan of Care (Signed)
   Problem: Coping: Goal: Ability to adjust to condition or change in health will improve Outcome: Progressing   Problem: Fluid Volume: Goal: Ability to maintain a balanced intake and output will improve Outcome: Progressing   Problem: Health Behavior/Discharge Planning: Goal: Ability to identify and utilize available resources and services will improve Outcome: Progressing

## 2023-06-30 ENCOUNTER — Inpatient Hospital Stay

## 2023-06-30 DIAGNOSIS — I214 Non-ST elevation (NSTEMI) myocardial infarction: Secondary | ICD-10-CM

## 2023-06-30 DIAGNOSIS — G9341 Metabolic encephalopathy: Secondary | ICD-10-CM | POA: Diagnosis not present

## 2023-06-30 DIAGNOSIS — N179 Acute kidney failure, unspecified: Secondary | ICD-10-CM | POA: Diagnosis not present

## 2023-06-30 DIAGNOSIS — J9601 Acute respiratory failure with hypoxia: Secondary | ICD-10-CM | POA: Diagnosis not present

## 2023-06-30 DIAGNOSIS — C3492 Malignant neoplasm of unspecified part of left bronchus or lung: Secondary | ICD-10-CM

## 2023-06-30 DIAGNOSIS — A419 Sepsis, unspecified organism: Secondary | ICD-10-CM

## 2023-06-30 LAB — GLUCOSE, CAPILLARY
Glucose-Capillary: 126 mg/dL — ABNORMAL HIGH (ref 70–99)
Glucose-Capillary: 169 mg/dL — ABNORMAL HIGH (ref 70–99)
Glucose-Capillary: 172 mg/dL — ABNORMAL HIGH (ref 70–99)
Glucose-Capillary: 175 mg/dL — ABNORMAL HIGH (ref 70–99)
Glucose-Capillary: 204 mg/dL — ABNORMAL HIGH (ref 70–99)
Glucose-Capillary: 215 mg/dL — ABNORMAL HIGH (ref 70–99)
Glucose-Capillary: 258 mg/dL — ABNORMAL HIGH (ref 70–99)
Glucose-Capillary: 262 mg/dL — ABNORMAL HIGH (ref 70–99)

## 2023-06-30 LAB — CBC WITH DIFFERENTIAL/PLATELET
Abs Immature Granulocytes: 0.05 10*3/uL (ref 0.00–0.07)
Basophils Absolute: 0.1 10*3/uL (ref 0.0–0.1)
Basophils Relative: 0 %
Eosinophils Absolute: 0.1 10*3/uL (ref 0.0–0.5)
Eosinophils Relative: 1 %
HCT: 32.2 % — ABNORMAL LOW (ref 39.0–52.0)
Hemoglobin: 10.5 g/dL — ABNORMAL LOW (ref 13.0–17.0)
Immature Granulocytes: 0 %
Lymphocytes Relative: 9 %
Lymphs Abs: 1 10*3/uL (ref 0.7–4.0)
MCH: 30.6 pg (ref 26.0–34.0)
MCHC: 32.6 g/dL (ref 30.0–36.0)
MCV: 93.9 fL (ref 80.0–100.0)
Monocytes Absolute: 1.2 10*3/uL — ABNORMAL HIGH (ref 0.1–1.0)
Monocytes Relative: 10 %
Neutro Abs: 9.5 10*3/uL — ABNORMAL HIGH (ref 1.7–7.7)
Neutrophils Relative %: 80 %
Platelets: 347 10*3/uL (ref 150–400)
RBC: 3.43 MIL/uL — ABNORMAL LOW (ref 4.22–5.81)
RDW: 15 % (ref 11.5–15.5)
WBC: 12 10*3/uL — ABNORMAL HIGH (ref 4.0–10.5)
nRBC: 0 % (ref 0.0–0.2)

## 2023-06-30 LAB — BASIC METABOLIC PANEL WITH GFR
Anion gap: 6 (ref 5–15)
BUN: 37 mg/dL — ABNORMAL HIGH (ref 8–23)
CO2: 28 mmol/L (ref 22–32)
Calcium: 8.6 mg/dL — ABNORMAL LOW (ref 8.9–10.3)
Chloride: 101 mmol/L (ref 98–111)
Creatinine, Ser: 1.44 mg/dL — ABNORMAL HIGH (ref 0.61–1.24)
GFR, Estimated: 48 mL/min — ABNORMAL LOW (ref >60)
Glucose, Bld: 259 mg/dL — ABNORMAL HIGH (ref 70–99)
Potassium: 4.4 mmol/L (ref 3.5–5.1)
Sodium: 135 mmol/L (ref 135–145)

## 2023-06-30 LAB — MAGNESIUM: Magnesium: 1.8 mg/dL (ref 1.7–2.4)

## 2023-06-30 LAB — PHOSPHORUS: Phosphorus: 2.8 mg/dL (ref 2.5–4.6)

## 2023-06-30 MED ORDER — INSULIN ASPART 100 UNIT/ML IJ SOLN
4.0000 [IU] | INTRAMUSCULAR | Status: DC
  Administered 2023-06-30 – 2023-07-04 (×18): 4 [IU] via SUBCUTANEOUS
  Filled 2023-06-30 (×17): qty 1

## 2023-06-30 MED ORDER — GADOBUTROL 1 MMOL/ML IV SOLN
9.0000 mL | Freq: Once | INTRAVENOUS | Status: AC | PRN
  Administered 2023-06-30: 9 mL via INTRAVENOUS

## 2023-06-30 MED ORDER — IPRATROPIUM-ALBUTEROL 0.5-2.5 (3) MG/3ML IN SOLN
3.0000 mL | Freq: Three times a day (TID) | RESPIRATORY_TRACT | Status: DC
  Administered 2023-06-30 – 2023-07-08 (×24): 3 mL via RESPIRATORY_TRACT
  Filled 2023-06-30 (×26): qty 3

## 2023-06-30 MED ORDER — HALOPERIDOL 1 MG PO TABS: 1.0000 mg | ORAL_TABLET | Freq: Four times a day (QID) | ORAL | Status: DC | PRN

## 2023-06-30 NOTE — Assessment & Plan Note (Addendum)
 CT chest concerning for cancer recurrence.  MRI of the brain did not show any masses.  Pulmonary to speak with the patient's wife today about whether or not to proceed with bronchoscopy.

## 2023-06-30 NOTE — Assessment & Plan Note (Addendum)
 Likely from septic shock.  Completed 72 hours of heparin  drip.  Cardiology not planning on any intervention.  Echocardiogram showing a normal EF at 50%.  Low-dose Toprol .  Since patient will be going home on Eliquis I will get rid of aspirin .

## 2023-06-30 NOTE — Progress Notes (Signed)
 Speech Language Pathology Treatment: Dysphagia  Patient Details Name: Edwin Jones MRN: [id number] DOB: [birth year] Today's Date: 06/30/2023 Time: 0865-7846 SLP Time Calculation (min) (ACUTE ONLY): 15 min  Assessment / Plan / Recommendation Clinical Impression  This writer meant with pt's wife to answer questions regarding results of Modified Barium Swallow Study. Results, level of impairment and recommendations as well as increased/high aspiration were reviewed including high risk of aspirating his own saliva. She commented "I know what you all want to do, you all are going to want to put a feeding tub in him."  This writer recommended she speak with Palliative Care and/or pt's Attending to help with such decision making. At this time, pt is not able to participate in skilled ST therapy targeting oropharyngeal exercises d/t lethargy, previous CVAs and comorbities. All questions were answered to the best of this writer's ability. ST services will sign off.    HPI HPI: Per critical care progress note, "Edwin Jones is an 83 year old African-American male with a history of type 2 diabetes, hypertension, small cell lung cancer, and COPD who presented to the ED via EMS with complaints of respiratory distress. History is obtained from EMS records as well as ED records as patient is currently intubated and sedated and there is no family at the bedside. Per ED records, EMS was called for respiratory distress. Upon EMS arrival, patient's oxygen saturation was 60%. Bag mask ventilation was started and patient was transported to the ED after an unsuccessful intubation attempt in the field. Upon ED arrival, patient had copious amounts of secretions in his oral cavity. Initial ED workup revealed multifocal pneumonia with sepsis. Patient was placed on BiPAP however he became combative and started coughing profusely and also had large amounts of secretions that filled up the BiPAP mask. His oxygen saturation dropped and  he was emergently intubated. PCCM was consulted for further management." Pt intubated 4/11-7/16. Per RN, spouse reported pt was ind with eating/drinking at baseline. Head CT 4/17: No CT evidence of acute intracranial abnormality.      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  Moderate chronic microvascular ischemic changes and parenchymal  volume loss.     Large right mastoid effusion. Recommend correlation for evidence of  mastoiditis. CXR 4/15: Patchy airspace disease in both lung bases, right greater than  left. Imaging features could be related to atelectasis or  infiltrate.      SLP Plan  Discharge SLP treatment due to (comment) (unable to praticipate in skilled ST)      Recommendations for follow up therapy are one component of a multi-disciplinary discharge planning process, led by the attending physician.  Recommendations may be updated based on patient status, additional functional criteria and insurance authorization.    Recommendations  Diet recommendations: NPO Medication Administration: Via alternative means                  Oral care QID   Frequent or constant Supervision/Assistance Dysphagia, oropharyngeal phase (R13.12)     Discharge SLP treatment due to (comment) (unable to praticipate in skilled ST)     Edwin Jones  06/30/2023, 2:05 PM

## 2023-06-30 NOTE — Therapy (Addendum)
 Modified Barium Swallow Study  Patient Details  Name: Edwin Jones MRN: [id number] Date of Birth: May 20, 1940  Today's Date: 06/30/2023  Modified Barium Swallow completed.  Full report located under Chart Review in the Imaging Section.  History of Present Illness Per critical care progress note, "Edwin Jones is an 83 year old African-American male with a history of type 2 diabetes, hypertension, small cell lung cancer, and COPD who presented to the ED via EMS with complaints of respiratory distress. History is obtained from EMS records as well as ED records as patient is currently intubated and sedated and there is no family at the bedside. Per ED records, EMS was called for respiratory distress. Upon EMS arrival, patient's oxygen saturation was 60%. Bag mask ventilation was started and patient was transported to the ED after an unsuccessful intubation attempt in the field. Upon ED arrival, patient had copious amounts of secretions in his oral cavity. Initial ED workup revealed multifocal pneumonia with sepsis. Patient was placed on BiPAP however he became combative and started coughing profusely and also had large amounts of secretions that filled up the BiPAP mask. His oxygen saturation dropped and he was emergently intubated. PCCM was consulted for further management." Pt intubated 4/11-7/16. Per RN, spouse reported pt was ind with eating/drinking at baseline. Head CT 4/17: No CT evidence of acute intracranial abnormality.      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  Moderate chronic microvascular ischemic changes and parenchymal  volume loss.     Large right mastoid effusion. Recommend correlation for evidence of  mastoiditis. CXR 4/15: Patchy airspace disease in both lung bases, right greater than  left. Imaging features could be related to atelectasis or  infiltrate.   Clinical Impression Pt largely unable support himself when sitting in the fluro chair. Open mouth posture.  NGT tube continues in place    Pt presents with profound oropharyngeal impairments with consuming thin liquids and nectar thick liquids via spoon, puree. Pt's oral phase is profound and is c/b minimal to tace oral movement in response to bolus with passive movement of bolus over the base of the tongue. Widespread residue observed throughout the oral cavity. Pt's pharyngeal phase exhibits moderate to severe impairments c/b delayed swallow initiation with boluses resting in the pyriform sinuses and intermittent penetration with thin liquids and nectar thick liquids. Thee penetrates could be see hanging over pt's airway and movements with respiration noted. There were occasions with pt hold bolus throughout his oropharynx with no attempts to swallow. During these moments tactile cues to his neck were intermittently successful. At this time, pt is at a very high risk of aspiration when consuming POs as well as increased risk of dehydration and malnutrition. A safe diet cannot be recommended at this time. Should his family consider pleasure PO intake, single spoonful of thin water  and small bites of puree would be recommended for pleasure not quantity. Pt is not able to participate in skilled ST services to progress his abilities.  Factors that may increase risk of adverse event in presence of aspiration [name] & [name] 2021): Poor general health and/or compromised immunity;Respiratory or GI disease;Reduced cognitive function;Limited mobility;Frail or deconditioned;Dependence for feeding and/or oral hygiene;Inadequate oral hygiene;Weak cough;Presence of tubes (ETT, trach, NG, etc.)  Swallow Evaluation Recommendations Recommendations: NPO Medication Administration: Via alternative means Oral care recommendations: Oral care QID (4x/day)    [name], M.S., CCC-SLP, CBIS Speech-Language Pathologist Certified Brain Injury Specialist [place]  [facility]  Services Office [phone] Ascom [phone] Fax [phone]

## 2023-06-30 NOTE — Inpatient Diabetes Management (Signed)
 Inpatient Diabetes Program Recommendations  AACE/ADA: New Consensus Statement on Inpatient Glycemic Control   Target Ranges:  Prepandial:   less than 140 mg/dL      Peak postprandial:   less than 180 mg/dL (1-2 hours)      Critically ill patients:  140 - 180 mg/dL    Latest Reference Range & Units 06/29/23 04:22 06/29/23 09:07 06/29/23 11:19 06/29/23 16:13 06/29/23 20:33 06/29/23 23:44 06/30/23 05:09 06/30/23 05:25 06/30/23 09:30  Glucose-Capillary 70 - 99 mg/dL 161 (H) 096 (H) 045 (H) 158 (H) 193 (H) 172 (H) 215 (H) 258 (H) 204 (H)   Review of Glycemic Control  Diabetes history: DM2 Outpatient Diabetes medications: Amaryl  4 mg daily, Lantus  10-12 units daily Current orders for Inpatient glycemic control: Semglee  5 units Q24H, Novolog  0-15 units Q4H, Osmoltie @ 60 ml/hr   Inpatient Diabetes Program Recommendations:     Insulin : Please consider ordering Novolog  4 units Q4H for tube feeding coverage. If tube feeding is stopped or held then Novolog  tube feeding coverage should also be stopped or held.  Thanks, Beacher Limerick, RN, MSN, CDCES Diabetes Coordinator Inpatient Diabetes Program [phone] (Team Pager from 8am to 5pm)

## 2023-06-30 NOTE — Progress Notes (Signed)
 Progress Note   Patient: Edwin Jones ZOX:[id number] DOB: 08/28/1940 DOA: 06/18/2023     12 DOS: the patient was seen and examined on 06/30/2023   Brief hospital course: 83 year old male patient with a past medical history of limited small cell lung cancer diagnosed in 2018 status post chemoradiation in remission with recent suspicion for recurrence was 4 or FDG avid on PET scan done in late 2024 presenting to [facility] on 0/11 with acute hypoxic respiratory failure secondary to community-acquired pneumonia complicated by septic shock.    4/18: Care transferred to [facility] 4/21: NGT placed on 4/20 for initiation of tube feeds.  Patient was tolerating however tube may have been dislodged overnight.  Coarse breath sounds and tube was found to be coiled up in the back of the throat.  First tube discontinued and new tube replaced with assistance of ICU charge RN. 4/22.  Tube feed resumed after NG tube confirmed in place. 4/23.  Patient lethargic.  Will get an MRI of the brain with and without contrast to rule out metastases or stroke.  Assessment and Plan: * Acute hypoxic respiratory failure (HCC) Patient required intubation during the hospital course and extubated on 4/17.  On 4/20 was weaned to 3 L nasal cannula.  Patient completed course of antibiotics.  Acute metabolic encephalopathy Patient's mental status has not improved.  Will obtain an MRI of the brain.  Initial CT scan of the head negative.  Patient did not do well today with swallow evaluation.  NSTEMI (non-ST elevated myocardial infarction) (HCC) Likely from septic shock.  Completed 72 hours of heparin  drip.  Cardiology not planning on any intervention.  Echocardiogram showing a normal EF at 50%.  AKI (acute kidney injury) (HCC) Creatinine 1.95 on presentation.  Last creatinine 1.44.  I suspect patient may have underlying chronic kidney disease stage IIIa.  Small cell lung cancer [facility]) CT chest concerning for cancer recurrence.  Will likely  need further workup.  Will get an MRI of the brain with and without contrast.  Septic shock (HCC) Secondary to multifocal pneumonia.  This has been treated with antibiotics.        Subjective: Patient unable to provide any history today.  Admitted with septic shock.  Physical Exam: Vitals:   06/30/23 0500 06/30/23 0523 06/30/23 0531 06/30/23 1115  BP:  (!) 133/47 121/61 128/65  Pulse:  69  (!) 109  Resp:  18    Temp:  99.7 F (37.6 C)  98.2 F (36.8 C)  TempSrc:  Oral  Oral  SpO2:  100%  95%  Weight: 93 kg     Height:       Physical Exam HENT:     Head: Normocephalic.  Eyes:     General: Lids are normal.     Conjunctiva/sclera: Conjunctivae normal.  Cardiovascular:     Rate and Rhythm: Normal rate and regular rhythm.     Heart sounds: Normal heart sounds, S1 normal and S2 normal.  Pulmonary:     Breath sounds: No decreased breath sounds, wheezing, rhonchi or rales.  Abdominal:     Palpations: Abdomen is soft.     Tenderness: There is no abdominal tenderness.  Musculoskeletal:     Right lower leg: No swelling.     Left lower leg: No swelling.  Skin:    General: Skin is warm.     Findings: No rash.  Neurological:     Mental Status: He is lethargic.     Comments: Would not let me open  his eyes.     Data Reviewed: Creatinine 1.44, white blood cell count 12.0, hemoglobin 10.5, platelet count 347 Family Communication: Tried to call wife.  Disposition: Status is: Inpatient Remains inpatient appropriate because: Patient did not do well with swallow evaluation.  Planned Discharge Destination: To be determined    Time spent: 28 minutes  Author: Verla Glaze, MD 06/30/2023 2:13 PM  For on call review [email or web address].

## 2023-06-30 NOTE — Plan of Care (Signed)

## 2023-06-30 NOTE — Progress Notes (Signed)
 Pt left for MRI. TF stopped and capped.

## 2023-06-30 NOTE — Assessment & Plan Note (Addendum)
 Patient required intubation during the hospital course and extubated on 4/17.  On 4/20 was weaned to 3 L nasal cannula.  Patient completed course of antibiotics.  Currently on room air.  Continue nebulizers and scopolamine  patch (for secretions).

## 2023-06-30 NOTE — Assessment & Plan Note (Addendum)
 Creatinine 1.95 on presentation.  Last creatinine 1.53.  I suspect patient may have underlying chronic kidney disease stage IIIa.

## 2023-06-30 NOTE — Assessment & Plan Note (Addendum)
 Mental status slowly improving on a day-to-day basis.  Speech pathology recommends proceeding with feeding tube because he will not be able to keep up with his nutritional needs.

## 2023-06-30 NOTE — Assessment & Plan Note (Addendum)
 Secondary to multifocal pneumonia.  This has been treated with antibiotics.  Patient did have a fever on 4/23 ago but procalcitonin was 0.25 so I will hold off on further antibiotics.  Repeat x-ray did show multifocal pneumonia but previous x-ray did show this also.

## 2023-06-30 NOTE — Hospital Course (Addendum)
 83 year old male patient with a past medical history of limited small cell lung cancer diagnosed in 2018 status post chemoradiation in remission with recent suspicion for recurrence was 4 or FDG avid on PET scan done in late 2024 presenting to [facility] on 0/11 with acute hypoxic respiratory failure secondary to community-acquired pneumonia complicated by septic shock.    4/18: Care transferred to [facility] 4/21: NGT placed on 4/20 for initiation of tube feeds.  Patient was tolerating however tube may have been dislodged overnight.  Coarse breath sounds and tube was found to be coiled up in the back of the throat.  First tube discontinued and new tube replaced with assistance of ICU charge RN. 4/22.  Tube feed resumed after NG tube confirmed in place. 4/23.  Patient lethargic.  MRI of the brain with and without contrast has motion degradation but did not see any acute stroke or mass. 4/24.  Patient seen with wife at the bedside.  Patient was able to open his eyes with sternal rub but had try to answer a few questions and shook his head and was able to squeeze my hand.  Wife wants me to limit medications that cause altered mental status and wanted me to decrease his dose of Celexa . 4/25.  Patient able to talk to me today.  Patient able to follow some simple commands.  Patient stated he feel terrible but could not elaborate. 4/26.  Patient pulled out the Dobbhoff tube last night.  NG tube replaced and in proper position.  Will restart tube feeds.  Patient's wife gave him a sip of water  and he did not choke.  Patients mental status has improved over the last couple days. 4/27.  Will hold Lovenox  injections for potential PEG tomorrow. 4/28.  Patient did better with swallow evaluation.  Patient will be put on pured diet with thin liquids.  Still high risk for aspiration and high risk for not keeping up with his nutritional needs.  Patient's wife wants to hold off on PEG feeding tube. 4/29.  Wife said he ate a good lunch  and she fed him.  She definitely wants to hold off on the feeding tube.  Will change Lovenox  over to Eliquis.

## 2023-07-01 ENCOUNTER — Inpatient Hospital Stay

## 2023-07-01 DIAGNOSIS — A419 Sepsis, unspecified organism: Secondary | ICD-10-CM | POA: Diagnosis not present

## 2023-07-01 DIAGNOSIS — J9601 Acute respiratory failure with hypoxia: Secondary | ICD-10-CM | POA: Diagnosis not present

## 2023-07-01 DIAGNOSIS — I82629 Acute embolism and thrombosis of deep veins of unspecified upper extremity: Secondary | ICD-10-CM

## 2023-07-01 DIAGNOSIS — I82721 Chronic embolism and thrombosis of deep veins of right upper extremity: Secondary | ICD-10-CM

## 2023-07-01 DIAGNOSIS — G9341 Metabolic encephalopathy: Secondary | ICD-10-CM | POA: Diagnosis not present

## 2023-07-01 DIAGNOSIS — J189 Pneumonia, unspecified organism: Secondary | ICD-10-CM | POA: Diagnosis not present

## 2023-07-01 LAB — GLUCOSE, CAPILLARY
Glucose-Capillary: 169 mg/dL — ABNORMAL HIGH (ref 70–99)
Glucose-Capillary: 185 mg/dL — ABNORMAL HIGH (ref 70–99)
Glucose-Capillary: 188 mg/dL — ABNORMAL HIGH (ref 70–99)
Glucose-Capillary: 199 mg/dL — ABNORMAL HIGH (ref 70–99)
Glucose-Capillary: 203 mg/dL — ABNORMAL HIGH (ref 70–99)
Glucose-Capillary: 208 mg/dL — ABNORMAL HIGH (ref 70–99)
Glucose-Capillary: 243 mg/dL — ABNORMAL HIGH (ref 70–99)

## 2023-07-01 LAB — BASIC METABOLIC PANEL WITH GFR
Anion gap: 7 (ref 5–15)
BUN: 37 mg/dL — ABNORMAL HIGH (ref 8–23)
CO2: 29 mmol/L (ref 22–32)
Calcium: 8.2 mg/dL — ABNORMAL LOW (ref 8.9–10.3)
Chloride: 100 mmol/L (ref 98–111)
Creatinine, Ser: 1.52 mg/dL — ABNORMAL HIGH (ref 0.61–1.24)
GFR, Estimated: 45 mL/min — ABNORMAL LOW (ref >60)
Glucose, Bld: 211 mg/dL — ABNORMAL HIGH (ref 70–99)
Potassium: 4.3 mmol/L (ref 3.5–5.1)
Sodium: 136 mmol/L (ref 135–145)

## 2023-07-01 LAB — PROCALCITONIN: Procalcitonin: 0.25 ng/mL

## 2023-07-01 MED ORDER — CITALOPRAM HYDROBROMIDE 20 MG PO TABS
10.0000 mg | ORAL_TABLET | Freq: Every day | ORAL | Status: DC
  Administered 2023-07-01 – 2023-07-04 (×4): 10 mg
  Filled 2023-07-01 (×4): qty 1

## 2023-07-01 MED ORDER — ENOXAPARIN SODIUM 100 MG/ML IJ SOSY
1.0000 mg/kg | PREFILLED_SYRINGE | Freq: Two times a day (BID) | INTRAMUSCULAR | Status: DC
  Administered 2023-07-01 – 2023-07-04 (×6): 95 mg via SUBCUTANEOUS
  Filled 2023-07-01 (×6): qty 1

## 2023-07-01 NOTE — Progress Notes (Signed)
 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   Allergies  Allergen Reactions   Penicillin G Anaphylaxis    Other reaction(s): Other (See Comments) Couldn't breath among other things Has patient had a PCN reaction causing immediate rash, facial/tongue/throat swelling, SOB or lightheadedness with hypotension: Yes Has patient had a PCN reaction causing severe rash involving mucus membranes or skin necrosis: No Has patient had a PCN reaction that required hospitalization: Yes Has patient had a PCN reaction occurring within the last 10 years: No If all of the above answers are "NO", then may proceed with Ce   Shellfish-Derived Products Anaphylaxis   Statins     Other reaction(s): Other (See Comments) Muscle spasms - can't walk - couldn't turn over in bed   Erythromycin Itching    All mycins   Tamsulosin     Other reaction(s): Dizziness   Tuberculin Ppd     Other reaction(s): Other (See Comments) False test    Iodinated Contrast Media Hives    Patient Measurements: Height: 6\' 3"  (190.5 cm) Weight: 93 kg (205 lb 0.4 oz) IBW/kg (Calculated) : 84.5 HEPARIN  DW (KG): 95.3  Vital Signs: Temp: 97.8 F (36.6 C) (04/24 0756) Temp Source: Oral (04/24 0420) BP: 98/63 (04/24 0756) Pulse Rate: 64 (04/24 0756)  Labs: Recent Labs    06/29/23 1024 06/30/23 0500 07/01/23 0505  HGB 10.4* 10.5*  --   HCT 31.0* 32.2*  --   PLT 336 347  --   CREATININE 1.44* 1.44* 1.52*    Estimated Creatinine Clearance: 44 mL/min (A) (by C-G formula based on SCr of 1.52 mg/dL (H)).   Medical History: Past Medical History:  Diagnosis Date   Cancer (HCC)    COPD (chronic obstructive pulmonary disease) (HCC)    Diabetes mellitus without complication (HCC)    GERD without esophagitis    Hepatitis C virus    history of hep C treated with Harvoni    Hypercholesterolemia    Hypertension    Seasonal allergies     Medications:  Medications Prior to Admission  Medication Sig Dispense Refill Last Dose/Taking   ascorbic acid (VITAMIN C) 500 MG tablet Take 500 mg by mouth daily.   Past Week   aspirin  81 MG chewable tablet Chew 4 tablets (324 mg total) by mouth daily. (Patient taking differently: Chew 81 mg by mouth daily.)   Past Week   cholecalciferol (VITAMIN D3) 25 MCG (1000 UNIT) tablet Take 1,000 Units by mouth daily.   Past Week   citalopram  (CELEXA ) 20 MG tablet Take 20 mg by mouth daily.   Past Week   cyanocobalamin  (VITAMIN B12) 500 MCG tablet Take 500 mcg by mouth daily.   Past Week   ezetimibe (ZETIA) 10 MG tablet Take 10 mg by mouth daily.   Past Week   Fluticasone -Umeclidin-Vilant (TRELEGY ELLIPTA ) 100-62.5-25 MCG/ACT AEPB Inhale 1 puff into the lungs daily. 14 each 0 Past Week   Garlic 2 MG CAPS Take by mouth.   Past Week   glimepiride  (AMARYL ) 4 MG tablet Take 1 tablet (4 mg total) by mouth daily with breakfast. 30 tablet 0 Past Week   insulin  glargine (LANTUS ) 100 UNIT/ML injection Inject 10-12 Units into the skin daily.   Past Week   Omega-3 Fatty Acids (OMEGA-3 FISH OIL PO) Take by mouth.   Past Week   pantoprazole  (PROTONIX ) 40 MG tablet Take 1 tablet (40  mg total) by mouth daily. 30 tablet 0 Past Week   zinc sulfate, 50mg  elemental zinc, 220 (50 Zn) MG capsule Take 220 mg by mouth daily.   Past Week   Apple Cider Vinegar 188 MG CAPS Take by mouth.   Unknown   blood glucose meter kit and supplies KIT Dispense based on patient and insurance preference. Use up to four times daily as directed. ICD: E11.9 1 each 0 Unknown   colchicine 0.6 MG tablet Take by mouth.   Unknown   Fluticasone -Umeclidin-Vilant (TRELEGY ELLIPTA ) 100-62.5-25 MCG/ACT AEPB Inhale 1 puff into the lungs daily. 3 each 3 Unknown   protein supplement shake (PREMIER PROTEIN) LIQD Take 325 mLs (11 oz total) by mouth 4 (four) times daily.  0 Unknown   Scheduled:    aspirin   81 mg Per Tube Daily   budesonide  (PULMICORT ) nebulizer solution  0.5 mg Nebulization BID   Chlorhexidine  Gluconate Cloth  6 each Topical Daily   citalopram   10 mg Per Tube QHS   enoxaparin  (LOVENOX ) injection  1 mg/kg (Order-Specific) Subcutaneous Q12H   feeding supplement (PROSource TF20)  60 mL Per Tube Daily   free water   100 mL Per Tube Q4H   insulin  aspart  0-15 Units Subcutaneous Q4H   insulin  aspart  4 Units Subcutaneous Q4H   insulin  glargine-yfgn  5 Units Subcutaneous Q24H   ipratropium-albuterol   3 mL Nebulization TID   metoprolol  tartrate  25 mg Per Tube BID   mouth rinse  15 mL Mouth Rinse Q2H   sodium chloride  flush  10-40 mL Intracatheter Q12H   thiamine   100 mg Per Tube Daily   Infusions:   feeding supplement (OSMOLITE 1.5 CAL) 1,000 mL (07/01/23 0727)    Assessment: 83 yo M to start Lovenox  therapeutic dosing for 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

## 2023-07-01 NOTE — Progress Notes (Signed)
 Progress Note   Patient: Edwin Jones ZOX:[id number] DOB: 1940-09-28 DOA: 06/18/2023     13 DOS: the patient was seen and examined on 07/01/2023   Brief hospital course: 83 year old male patient with a past medical history of limited small cell lung cancer diagnosed in 2018 status post chemoradiation in remission with recent suspicion for recurrence was 4 or FDG avid on PET scan done in late 2024 presenting to [facility] on 0/11 with acute hypoxic respiratory failure secondary to community-acquired pneumonia complicated by septic shock.    4/18: Care transferred to [facility] 4/21: NGT placed on 4/20 for initiation of tube feeds.  Patient was tolerating however tube may have been dislodged overnight.  Coarse breath sounds and tube was found to be coiled up in the back of the throat.  First tube discontinued and new tube replaced with assistance of ICU charge RN. 4/22.  Tube feed resumed after NG tube confirmed in place. 4/23.  Patient lethargic.  Will get an MRI of the brain with and without contrast has motion degradation but did not see any acute stroke or mass. 4/24.  Patient seen with wife at the bedside.  Patient was able to open his eyes with sternal rub but had try to answer a few questions and shook his head and was able to squeeze my hand.  Wife wants me to limit medications that cause altered mental status and wanted me to decrease his dose of Celexa .  Assessment and Plan: * Acute hypoxic respiratory failure (HCC) Patient required intubation during the hospital course and extubated on 4/17.  On 4/20 was weaned to 3 L nasal cannula.  Patient completed course of antibiotics.  Currently on 4 L nasal cannula.  Acute metabolic encephalopathy Patient's mental status has not improved.  Initial CT scan of the head negative.  Patient did not do well yesterday with swallow evaluation.  MRI of the brain did not show any metastases or stroke.  Patient awakened with sternal rub and patient was able to follow  very simple commands while wife was there.  Decrease dose of Celexa  as per wife's request.  Discontinue Haldol .  NSTEMI (non-ST elevated myocardial infarction) (HCC) Likely from septic shock.  Completed 72 hours of heparin  drip.  Cardiology not planning on any intervention.  Echocardiogram showing a normal EF at 50%.  AKI (acute kidney injury) (HCC) Creatinine 1.95 on presentation.  Last creatinine 1.52.  I suspect patient may have underlying chronic kidney disease stage IIIa.  Small cell lung cancer [facility]) CT chest concerning for cancer recurrence.  Will likely need further workup.  MRI of the brain did not show any masses.  Septic shock (HCC) Secondary to multifocal pneumonia.  This has been treated with antibiotics.  Patient did have a fever yesterday but procalcitonin was 0.25 so I will hold off on further antibiotics.  If he spikes another fever we will get blood cultures.  Repeat x-ray did show multifocal pneumonia but it did show this also.  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  Will prescribe Lovenox  twice daily for now.  Still need to decide on whether he will need a PEG.        Subjective: Patient seen while wife was in the room.  I sternal rub them to get him to open his eyes.  He was able to squeeze my hand with his right hand and try to answer some questions and shook his head.  He  was more interactive today with his wife around and was when I saw him alone yesterday.  Physical Exam: Vitals:   07/01/23 0700 07/01/23 0756 07/01/23 1304 07/01/23 1519  BP:  98/63  98/64  Pulse:  64  98  Resp:  16  16  Temp:  97.8 F (36.6 C)  97.9 F (36.6 C)  TempSrc:    Axillary  SpO2: 95%  95% 96%  Weight:      Height:       Physical Exam HENT:     Head: Normocephalic.  Eyes:     General: Lids are normal.     Conjunctiva/sclera: Conjunctivae normal.  Cardiovascular:     Rate and Rhythm: Normal  rate and regular rhythm.     Heart sounds: Normal heart sounds, S1 normal and S2 normal.  Pulmonary:     Breath sounds: No decreased breath sounds, wheezing, rhonchi or rales.  Abdominal:     Palpations: Abdomen is soft.     Tenderness: There is no abdominal tenderness.  Musculoskeletal:     Right upper arm: Swelling present.     Right lower leg: No swelling.     Left lower leg: No swelling.  Skin:    General: Skin is warm.     Findings: No rash.  Neurological:     Mental Status: He is lethargic.     Comments: Awakens with sternal rub.  Shook his head to some questions.  Able to squeeze my hand with his right hand.     Data Reviewed: Chronic nonocclusive thrombus within the right internal jugular vein Creatinine 1.52, procalcitonin 0.25  Family Communication: Patient's wife at the bedside  Disposition: Status is: Inpatient Remains inpatient appropriate because: Will start high-dose Lovenox  for now to treat chronic nonocclusive thrombus within the right internal jugular vein  Planned Discharge Destination: To be determined    Time spent: 32 minutes  Author: Verla Glaze, MD 07/01/2023 4:27 PM  For on call review [email or web address].

## 2023-07-01 NOTE — Plan of Care (Signed)
  Problem: Coping: Goal: Ability to adjust to condition or change in health will improve Outcome: Progressing   Problem: Nutritional: Goal: Maintenance of adequate nutrition will improve Outcome: Progressing   Problem: Skin Integrity: Goal: Risk for impaired skin integrity will decrease Outcome: Progressing   Problem: Tissue Perfusion: Goal: Adequacy of tissue perfusion will improve Outcome: Progressing   Problem: Nutrition: Goal: Adequate nutrition will be maintained Outcome: Progressing   Problem: Coping: Goal: Level of anxiety will decrease Outcome: Progressing   Problem: Elimination: Goal: Will not experience complications related to bowel motility Outcome: Progressing Goal: Will not experience complications related to urinary retention Outcome: Progressing   Problem: Pain Managment: Goal: General experience of comfort will improve and/or be controlled Outcome: Progressing   Problem: Skin Integrity: Goal: Risk for impaired skin integrity will decrease Outcome: Progressing

## 2023-07-01 NOTE — Assessment & Plan Note (Addendum)
 DVT right internal jugular.  Patient does have a port.  Appeared chronic as per radiologist.  Will switch Lovenox  over to Eliquis for this evening.

## 2023-07-01 NOTE — TOC Progression Note (Addendum)
 Transition of Care [facility]) - Progression Note    Patient Details  Name: Edwin Jones MRN: [id number] Date of Birth: 1941-01-13  Transition of [facility]) CM/SW Contact  Odilia Bennett, LCSW Phone Number: 07/01/2023, 8:27 AM  Clinical Narrative:  No response from [facility] liaison regarding DME requests. CSW sent her a follow up email.   3:08 pm: Left voicemail for [facility].  Expected Discharge Plan and Services                                               Social Determinants of Health (SDOH) Interventions SDOH Screenings   Food Insecurity: Patient Unable To Answer (06/19/2023)  Housing: Patient Unable To Answer (06/19/2023)  Transportation Needs: Patient Unable To Answer (06/19/2023)  Utilities: Patient Unable To Answer (06/19/2023)  Social Connections: Patient Unable To Answer (06/19/2023)  Tobacco Use: Medium Risk (06/18/2023)    Readmission Risk Interventions    06/29/2023    1:27 PM  Readmission Risk Prevention Plan  PCP or Specialist Appt within 3-5 Days Complete  HRI or Home Care Consult Complete  Social Work Consult for Recovery Care Planning/Counseling Complete  Palliative Care Screening Not Applicable

## 2023-07-01 NOTE — Progress Notes (Signed)
 Noted intermittent Afib on telemetry. Review since 0100 shows pt in and out of afib. Pt currently on lovenox  daily. Notified Elisabeth Guild, NP. EKG ordered if pt back in afib.

## 2023-07-02 ENCOUNTER — Inpatient Hospital Stay: Admit: 2023-07-02 | Discharge: 2023-07-02 | Disposition: A | Discharge status: Home / Self Care

## 2023-07-02 DIAGNOSIS — I82621 Acute embolism and thrombosis of deep veins of right upper extremity: Secondary | ICD-10-CM

## 2023-07-02 DIAGNOSIS — I214 Non-ST elevation (NSTEMI) myocardial infarction: Secondary | ICD-10-CM | POA: Diagnosis not present

## 2023-07-02 DIAGNOSIS — G9341 Metabolic encephalopathy: Secondary | ICD-10-CM | POA: Diagnosis not present

## 2023-07-02 DIAGNOSIS — J9601 Acute respiratory failure with hypoxia: Secondary | ICD-10-CM | POA: Diagnosis not present

## 2023-07-02 LAB — URINALYSIS, W/ REFLEX TO CULTURE (INFECTION SUSPECTED)
Bacteria, UA: NONE SEEN
Bilirubin Urine: NEGATIVE
Glucose, UA: NEGATIVE mg/dL
Hgb urine dipstick: NEGATIVE
Ketones, ur: NEGATIVE mg/dL
Leukocytes,Ua: NEGATIVE
Nitrite: NEGATIVE
Protein, ur: 30 mg/dL — AB
Specific Gravity, Urine: 1.019 (ref 1.005–1.030)
pH: 7 (ref 5.0–8.0)

## 2023-07-02 LAB — CBC WITH DIFFERENTIAL/PLATELET
Abs Immature Granulocytes: 0.04 10*3/uL (ref 0.00–0.07)
Basophils Absolute: 0 10*3/uL (ref 0.0–0.1)
Basophils Relative: 0 %
Eosinophils Absolute: 0.2 10*3/uL (ref 0.0–0.5)
Eosinophils Relative: 2 %
HCT: 30.8 % — ABNORMAL LOW (ref 39.0–52.0)
Hemoglobin: 10.4 g/dL — ABNORMAL LOW (ref 13.0–17.0)
Immature Granulocytes: 0 %
Lymphocytes Relative: 13 %
Lymphs Abs: 1.2 10*3/uL (ref 0.7–4.0)
MCH: 31.5 pg (ref 26.0–34.0)
MCHC: 33.8 g/dL (ref 30.0–36.0)
MCV: 93.3 fL (ref 80.0–100.0)
Monocytes Absolute: 1 10*3/uL (ref 0.1–1.0)
Monocytes Relative: 11 %
Neutro Abs: 7 10*3/uL (ref 1.7–7.7)
Neutrophils Relative %: 74 %
Platelets: 404 10*3/uL — ABNORMAL HIGH (ref 150–400)
RBC: 3.3 MIL/uL — ABNORMAL LOW (ref 4.22–5.81)
RDW: 15 % (ref 11.5–15.5)
Smear Review: NORMAL
WBC: 9.5 10*3/uL (ref 4.0–10.5)
nRBC: 0 % (ref 0.0–0.2)

## 2023-07-02 LAB — BASIC METABOLIC PANEL WITH GFR
Anion gap: 7 (ref 5–15)
BUN: 37 mg/dL — ABNORMAL HIGH (ref 8–23)
CO2: 27 mmol/L (ref 22–32)
Calcium: 8.5 mg/dL — ABNORMAL LOW (ref 8.9–10.3)
Chloride: 97 mmol/L — ABNORMAL LOW (ref 98–111)
Creatinine, Ser: 1.37 mg/dL — ABNORMAL HIGH (ref 0.61–1.24)
GFR, Estimated: 51 mL/min — ABNORMAL LOW (ref >60)
Glucose, Bld: 227 mg/dL — ABNORMAL HIGH (ref 70–99)
Potassium: 4.4 mmol/L (ref 3.5–5.1)
Sodium: 131 mmol/L — ABNORMAL LOW (ref 135–145)

## 2023-07-02 LAB — GLUCOSE, CAPILLARY
Glucose-Capillary: 241 mg/dL — ABNORMAL HIGH (ref 70–99)
Glucose-Capillary: 285 mg/dL — ABNORMAL HIGH (ref 70–99)
Glucose-Capillary: 248 mg/dL — ABNORMAL HIGH (ref 70–99)
Glucose-Capillary: 245 mg/dL — ABNORMAL HIGH (ref 70–99)

## 2023-07-02 MED ORDER — SCOPOLAMINE 1 MG/3DAYS TD PT72
1.0000 | MEDICATED_PATCH | TRANSDERMAL | Status: DC
  Administered 2023-07-02 – 2023-07-08 (×3): 1.5 mg via TRANSDERMAL
  Filled 2023-07-02 (×3): qty 1

## 2023-07-02 MED ORDER — INSULIN GLARGINE-YFGN 100 UNIT/ML ~~LOC~~ SOLN
8.0000 [IU] | Freq: Every day | SUBCUTANEOUS | Status: DC
  Administered 2023-07-02 – 2023-07-05 (×4): 8 [IU] via SUBCUTANEOUS
  Filled 2023-07-02 (×4): qty 0.08

## 2023-07-02 NOTE — Progress Notes (Signed)
 Nutrition Follow Up Note   DOCUMENTATION CODES:   Not applicable  INTERVENTION:   Continue Osmolite 1.5@60ml /hr + ProSource TF 20- Give 60ml daily via tube  Free water  flushes 100ml q4 hours   Regimen provides 2240kcal/day, 110g/day protein and 1697ml/day of free water .   Once G-tube in place:  Change to Osmolite 1.5- Give 6 cartons daily via tube. Begin with 1/2 carton and advance as tolerated. Flush with 50ml of water  before and after each feeding.   Regimen provides 2130kcal/day, 90g/day protein and 1645ml/day of free water .   NUTRITION DIAGNOSIS:   Inadequate oral intake related to inability to eat as evidenced by NPO status. -ongoing   GOAL:   Patient will meet greater than or equal to 90% of their needs -met with tube feeds  MONITOR:   Diet advancement, Labs, Weight trends, I & O's, Skin  ASSESSMENT:   83 y/o male with h/o HLD, GERD, HTN, DM, small cell lung cancer diagnosed in 2018 status post chemoradiation in remission, CVA with hemiparesis, CKD, PAD, COPD and hepatitis C who is admitted with NSTEMI, PNA, septic shock and AKI.  Pt s/p NGT placement 4/20 (replaced 4/21).  Met with patient in room. Pt is more alert and is able to answer some basic questions. Pt continues to tolerate tube feeds at goal rate. Refeed labs stable. Pt is being followed by SLP and remains NPO. Spoke with pt's wife via phone this morning. RD discussed with wife the recommendation for G-tube placement and nutrition support. Provided information to wife regarding what to expect with home tube feeds and tube placement. Wife met with MD this morning and has decided to proceed with G-tube placement. Will change over to bolus feeds once G-tube in place. Per chart, pt appears fairly weight stable since admission.   Medications reviewed and include: aspirin , celexa , lovenox , insulin , scopolamine , thiamine   Labs reviewed: Na 131(L), K 4.4 wnl, BUN 37(H), creat 1.37(H) Hgb 10.4(L), Hct  30.8(L) Cbgs- 285, 241 x 24 hrs   UOP-   Diet Order:   Diet Order             Diet NPO time specified  Diet effective now                  EDUCATION NEEDS:   No education needs have been identified at this time  Skin:  Skin Assessment: Reviewed RN Assessment  Last BM:  4/24- type 6  Height:   Ht Readings from Last 1 Encounters:  06/22/23 6\' 3"  (1.905 m)    Weight:   Wt Readings from Last 1 Encounters:  06/30/23 93 kg    Ideal Body Weight:  89.1 kg  BMI:  Body mass index is 25.63 kg/m.  Estimated Nutritional Needs:   Kcal:  2200-2500kcal/day  Protein:  110-125g/day  Fluid:  2.2-2.5L/day  Torrance Freestone MS, RD, LDN If unable to be reached, please send secure chat to "RD inpatient" available from 8:00a-4:00p daily

## 2023-07-02 NOTE — Progress Notes (Signed)
 Occupational Therapy Treatment Patient Details Name: Edwin Jones MRN: [id number] DOB: [birth year] Today's Date: 07/02/2023   History of present illness Pt is an 83 y/o M admitted on 06/18/23 after presenting to the ED with c/o respiratory distress. Pt required intubation, found to have multifocal PNA with sepsis. Pt also being treated for NSTEMI, septic shock. Pt extubated 06/23/23. PMH: DM2, HTN, small cell lung CA, COPD, Hep C, hypercholesterolemia, CVA with residual R sided deficits   OT comments  Upon entering the room, pt supine in bed with NT present assisting pt and wife present in room. Pt had been incontinent of BM and needing total A to roll and total A for hygiene and linen change. Total A of from supine >sit. Pt holding self up on EOB with supervision - CGA and holding onto R bedrail. Pt needing max A to change hospital gown while seated on EOB. Pt also reaching in various directions and across midline with B LEs with close supervision. Pt needing total A of 2 to return to supine with call bell and all needed items within reach.       If plan is discharge home, recommend the following:  Two people to help with walking and/or transfers;Two people to help with bathing/dressing/bathroom   Equipment Recommendations  None recommended by OT       Precautions / Restrictions Precautions Precautions: Fall       Mobility Bed Mobility Overal bed mobility: Needs Assistance Bed Mobility: Supine to Sit, Sit to Supine Rolling: Total assist, +2 for physical assistance, Used rails   Supine to sit: Total assist, +2 for physical assistance, HOB elevated Sit to supine: Total assist, +2 for physical assistance        Transfers                         Balance Overall balance assessment: Needs assistance Sitting-balance support: Feet supported, Bilateral upper extremity supported Sitting balance-Leahy Scale: Fair Sitting balance - Comments: excellent progression in sitting  balance with feet and RUE support on bed rail. Able to perform minimal anterior trunk lean                                   ADL either performed or assessed with clinical judgement   ADL Overall ADL's : Needs assistance/impaired     Grooming: Wash/dry hands;Wash/dry face;Sitting;Minimal assistance                                      Extremity/Trunk Assessment Upper Extremity Assessment Upper Extremity Assessment: Generalized weakness   Lower Extremity Assessment Lower Extremity Assessment: Generalized weakness        Vision Patient Visual Report: No change from baseline           Communication Communication Communication: Impaired Factors Affecting Communication: Reduced clarity of speech;Difficulty expressing self   Cognition Arousal: Alert Behavior During Therapy: Flat affect                                 Following commands: Intact Following commands impaired: Follows one step commands with increased time      Cueing   Cueing Techniques: Verbal cues, Gestural cues, Tactile cues  Exercises  Pertinent Vitals/ Pain       Pain Assessment Pain Assessment: Faces Faces Pain Scale: No hurt         Frequency  Min 2X/week        Progress Toward Goals  OT Goals(current goals can now be found in the care plan section)  Progress towards OT goals: Progressing toward goals         Co-evaluation      Reason for Co-Treatment: Complexity of the patient's impairments (multi-system involvement);Necessary to address cognition/behavior during functional activity;For patient/therapist safety PT goals addressed during session: Balance;Mobility/safety with mobility OT goals addressed during session: ADL's and self-care      AM-PAC OT "6 Clicks" Daily Activity     Outcome Measure   Help from another person eating meals?: Total Help from another person taking care of personal grooming?: Total Help  from another person toileting, which includes using toliet, bedpan, or urinal?: Total Help from another person bathing (including washing, rinsing, drying)?: Total Help from another person to put on and taking off regular upper body clothing?: Total Help from another person to put on and taking off regular lower body clothing?: Total 6 Click Score: 6    End of Session Equipment Utilized During Treatment: Oxygen  OT Visit Diagnosis: Other abnormalities of gait and mobility (R26.89);Muscle weakness (generalized) (M62.81);Hemiplegia and hemiparesis Hemiplegia - Right/Left: Right Hemiplegia - dominant/non-dominant: Dominant Hemiplegia - caused by: Cerebral infarction   Activity Tolerance Patient tolerated treatment well   Patient Left with call bell/phone within reach;with family/visitor present;in chair;with chair alarm set   Nurse Communication Mobility status        Time: 9518-8416 OT Time Calculation (min): 25 min  Charges: OT General Charges $OT Visit: 1 Visit OT Treatments $Self Care/Home Management : 8-22 mins  George Kinder, MS, OTR/L , CBIS ascom [phone]  07/02/23, 1:29 PM

## 2023-07-02 NOTE — TOC Progression Note (Addendum)
 Transition of Care [facility]) - Progression Note    Patient Details  Name: Edwin Jones MRN: [id number] Date of Birth: Mar 14, 1940  Transition of Care [facility]) CM/SW Contact  Odilia Bennett, LCSW Phone Number: 07/02/2023, 9:55 AM  Clinical Narrative:   CSW called the [facility] and spoke to the operator. She said patient's PCP would have to order DME so she sent him a message asking him to call CSW back. Will also need to ask if they could order tube feeds if patient needs g-tube.  1:50 pm: CSW met with wife and provided update. Dietician asked about referral to [facility] for tube feeds so that an outpatient RD can follow him. Wife is aware. CSW is waiting on a call back from liaison.  4:26 pm: Sent referral to [employer] in a secure email.  4:46 pm: [facility]. CSW sent another email to [facility] liaison to notify of plan. [facility] liaison will also send CSW her contact for enteral at the [facility].  Expected Discharge Plan and Services                                               Social Determinants of Health (SDOH) Interventions SDOH Screenings   Food Insecurity: Patient Unable To Answer (06/19/2023)  Housing: Patient Unable To Answer (06/19/2023)  Transportation Needs: Patient Unable To Answer (06/19/2023)  Utilities: Patient Unable To Answer (06/19/2023)  Social Connections: Patient Unable To Answer (06/19/2023)  Tobacco Use: Medium Risk (06/18/2023)    Readmission Risk Interventions    06/29/2023    1:27 PM  Readmission Risk Prevention Plan  PCP or Specialist Appt within 3-5 Days Complete  HRI or Home Care Consult Complete  Social Work Consult for Recovery Care Planning/Counseling Complete  Palliative Care Screening Not Applicable

## 2023-07-02 NOTE — Progress Notes (Signed)
 Physical Therapy Treatment Patient Details Name: Edwin Jones MRN: [id number] DOB: 09/06/1940 Today's Date: 07/02/2023   History of Present Illness Pt is an 83 y/o M admitted on 06/18/23 after presenting to the ED with c/o respiratory distress. Pt required intubation, found to have multifocal PNA with sepsis. Pt also being treated for NSTEMI, septic shock. Pt extubated 06/23/23. PMH: DM2, HTN, small cell lung CA, COPD, Hep C, hypercholesterolemia, CVA with residual R sided deficits    PT Comments  Pt received in side lying getting pericare assist from NT. TotalA+2 for side lying to sit. Maintained sitting EOB for 10-12 minutes working on UE ADL's with OT. Also performed anterior forward reaching for core stability with back unsupported to assist in return to adequate trunk strength for baseline ADL's at home. Pt becomes quickly fatigued at end of bouts requiring totalA+2 to transfer back to supine. Elevated > 30 degrees for HOB with quarter turn to the L via pillows for positional change to prevent skin break down. Pt with all needs in reach and d/c recs remaining appropriate.    If plan is discharge home, recommend the following: Two people to help with walking and/or transfers;Two people to help with bathing/dressing/bathroom;Assistance with feeding   Can travel by private vehicle     No  Equipment Recommendations  Other (comment)    Recommendations for Other Services       Precautions / Restrictions Precautions Precautions: Fall Restrictions Weight Bearing Restrictions Per Provider Order: No     Mobility  Bed Mobility Overal bed mobility: Needs Assistance Bed Mobility: Supine to Sit, Sit to Supine Rolling: Total assist, +2 for physical assistance, Used rails   Supine to sit: Total assist, +2 for physical assistance, HOB elevated Sit to supine: Total assist, +2 for physical assistance     Patient Response: Cooperative, Flat affect  Transfers                         Ambulation/Gait               General Gait Details: does not ambulate at baseline.   Stairs             Wheelchair Mobility     Tilt Bed Tilt Bed Patient Response: Cooperative, Flat affect  Modified Rankin (Stroke Patients Only)       Balance Overall balance assessment: Needs assistance Sitting-balance support: Feet supported, Bilateral upper extremity supported Sitting balance-Leahy Scale: Fair Sitting balance - Comments: excellent progression in sitting balance with feet and RUE support on bed rail. Able to perform minimal anterior trunk lean                                    Communication Communication Communication: Impaired Factors Affecting Communication: Reduced clarity of speech;Difficulty expressing self  Cognition Arousal: Alert Behavior During Therapy: Flat affect   PT - Cognitive impairments: History of cognitive impairments, Difficult to assess Difficult to assess due to: Impaired communication                       Following commands: Intact Following commands impaired: Follows one step commands with increased time    Cueing Cueing Techniques: Verbal cues, Gestural cues, Tactile cues  Exercises Other Exercises Other Exercises: anterior forward reaches with mainly LUE x3 reps. x2 reps RUE    General Comments  Pertinent Vitals/Pain Pain Assessment Pain Assessment: Faces Faces Pain Scale: No hurt    Home Living                          Prior Function            PT Goals (current goals can now be found in the care plan section) Acute Rehab PT Goals Patient Stated Goal: get better, return to PLOF PT Goal Formulation: With family Time For Goal Achievement: 07/08/23 Potential to Achieve Goals: Fair Progress towards PT goals: Progressing toward goals    Frequency    Min 2X/week      PT Plan      Co-evaluation PT/OT/SLP Co-Evaluation/Treatment: Yes Reason for Co-Treatment:  Complexity of the patient's impairments (multi-system involvement);Necessary to address cognition/behavior during functional activity;For patient/therapist safety PT goals addressed during session: Balance;Mobility/safety with mobility OT goals addressed during session: ADL's and self-care      AM-PAC PT "6 Clicks" Mobility   Outcome Measure  Help needed turning from your back to your side while in a flat bed without using bedrails?: Total Help needed moving from lying on your back to sitting on the side of a flat bed without using bedrails?: Total Help needed moving to and from a bed to a chair (including a wheelchair)?: Total Help needed standing up from a chair using your arms (e.g., wheelchair or bedside chair)?: Total Help needed to walk in hospital room?: Total Help needed climbing 3-5 steps with a railing? : Total 6 Click Score: 6    End of Session Equipment Utilized During Treatment: Oxygen Activity Tolerance: Patient tolerated treatment well Patient left: in bed;with call bell/phone within reach;with bed alarm set;with family/visitor present Nurse Communication: Mobility status PT Visit Diagnosis: Other abnormalities of gait and mobility (R26.89);Muscle weakness (generalized) (M62.81)     Time: 1610-9604 PT Time Calculation (min) (ACUTE ONLY): 24 min  Charges:    $Therapeutic Activity: 8-22 mins PT General Charges $$ ACUTE PT VISIT: 1 Visit                    Marc Senior. Fairly IV, PT, DPT Physical Therapist- [place]  [facility]  07/02/2023, 1:30 PM

## 2023-07-02 NOTE — Plan of Care (Signed)

## 2023-07-02 NOTE — Consult Note (Signed)
 Consultation  Referring Provider:  Hospitalist    Admit date: 06/18/2023 Consult date: 07/02/2023         Reason for Consultation: Possible PEG placement              HPI:   Edwin Jones is a 83 y.o. gentleman with history of COPD, Hep C (treated with Harvoni), and lung cancer here with respiratory failure and septic shock from pneumonia. Appears he was intubated for a few days and then extubated with NG tube placed 4/20 for tube feeds. Patient responds but can't answer questions in detail. Appears this is an improvement from before. Wife not at bedside.  Past Medical History:  Diagnosis Date   Cancer (HCC)    COPD (chronic obstructive pulmonary disease) (HCC)    Diabetes mellitus without complication (HCC)    GERD without esophagitis    Hepatitis C virus    history of hep C treated with Harvoni   Hypercholesterolemia    Hypertension    Seasonal allergies     Past Surgical History:  Procedure Laterality Date   COLONOSCOPY     approximately 11 years ago   FLEXIBLE BRONCHOSCOPY N/A 11/05/2016   Procedure: FLEXIBLE BRONCHOSCOPY;  Surgeon: Valiant Gaul, MD;  Location: [facility] ORS;  Service: Pulmonary;  Laterality: N/A;   IR FLUORO GUIDE CV LINE RIGHT  05/06/2017   IR INTRAVSC STENT CERV CAROTID W/O EMB-PROT MOD SED INC ANGIO  05/06/2017   IR PERCUTANEOUS ART THROMBECTOMY/INFUSION INTRACRANIAL INC DIAG ANGIO  05/06/2017   IR US  GUIDE VASC ACCESS LEFT  05/06/2017   IR US  GUIDE VASC ACCESS RIGHT  05/06/2017   IR US  GUIDE VASC ACCESS RIGHT  05/06/2017   PORTA CATH INSERTION N/A 11/23/2016   Procedure: Melville Stade Cath Insertion;  Surgeon: Celso College, MD;  Location: [facility] INVASIVE CV LAB;  Service: Cardiovascular;  Laterality: N/A;   RADIOLOGY WITH ANESTHESIA N/A 05/06/2017   Procedure: IR WITH ANESTHESIA;  Surgeon: Radiologist, Medication, MD;  Location: [facility] OR;  Service: Radiology;  Laterality: N/A;   TEE WITHOUT CARDIOVERSION N/A 05/10/2017   Procedure: TRANSESOPHAGEAL ECHOCARDIOGRAM (TEE);   Surgeon: Luana Rumple, MD;  Location[facility] ENDOSCOPY;  Service: Cardiovascular;  Laterality: N/A;   TONSILLECTOMY      Family History  Problem Relation Age of Onset   Skin cancer Mother    Ovarian cancer Sister        sister was diagnosed as a teenager   Throat cancer Brother 56       brother #1   Lung cancer Sister 29   Throat cancer Brother 25       brother #2   Skin cancer Sister     Social History   Tobacco Use   Smoking status: Former    Current packs/day: 0.00    Average packs/day: 1 pack/day for 50.0 years (50.0 ttl pk-yrs)    Types: Cigarettes    Start date: 04/07/1954    Quit date: 04/07/2004    Years since quitting: 19.2   Smokeless tobacco: Never  Substance Use Topics   Alcohol  use: No    Comment: history of alcohol  use   Drug use: No    Prior to Admission medications   Medication Sig Start Date End Date Taking? Authorizing Provider  ascorbic acid (VITAMIN C) 500 MG tablet Take 500 mg by mouth daily.   Yes [provider]  aspirin  81 MG chewable tablet Chew 4 tablets (324 mg total) by mouth daily. Patient taking differently: Chew 81 mg  by mouth daily. 06/15/17  Yes Love, Renay Carota, PA-C  cholecalciferol (VITAMIN D3) 25 MCG (1000 UNIT) tablet Take 1,000 Units by mouth daily.   Yes [provider]  citalopram  (CELEXA ) 20 MG tablet Take 20 mg by mouth daily.   Yes [provider]  cyanocobalamin  (VITAMIN B12) 500 MCG tablet Take 500 mcg by mouth daily.   Yes [provider]  ezetimibe (ZETIA) 10 MG tablet Take 10 mg by mouth daily. 06/08/23  Yes [provider]  Fluticasone -Umeclidin-Vilant (TRELEGY ELLIPTA ) 100-62.5-25 MCG/ACT AEPB Inhale 1 puff into the lungs daily. 06/16/23  Yes Assaker, Marianne Shirts, MD  Garlic 2 MG CAPS Take by mouth.   Yes [provider]  glimepiride  (AMARYL ) 4 MG tablet Take 1 tablet (4 mg total) by mouth daily with breakfast. 06/15/17  Yes Love, Pamela S, PA-C  insulin  glargine (LANTUS ) 100  UNIT/ML injection Inject 10-12 Units into the skin daily.   Yes [provider]  Omega-3 Fatty Acids (OMEGA-3 FISH OIL PO) Take by mouth.   Yes [provider]  pantoprazole  (PROTONIX ) 40 MG tablet Take 1 tablet (40 mg total) by mouth daily. 06/15/17  Yes Love, Renay Carota, PA-C  zinc sulfate, 50mg  elemental zinc, 220 (50 Zn) MG capsule Take 220 mg by mouth daily.   Yes [provider]  Apple Cider Vinegar 188 MG CAPS Take by mouth.    [provider]  blood glucose meter kit and supplies KIT Dispense based on patient and insurance preference. Use up to four times daily as directed. ICD: E11.9 06/15/17   Zelda Hickman, PA-C  colchicine 0.6 MG tablet Take by mouth. 05/29/22   [provider]  Fluticasone -Umeclidin-Vilant (TRELEGY ELLIPTA ) 100-62.5-25 MCG/ACT AEPB Inhale 1 puff into the lungs daily. 06/16/23   Assaker, Marianne Shirts, MD  protein supplement shake (PREMIER PROTEIN) LIQD Take 325 mLs (11 oz total) by mouth 4 (four) times daily. 06/14/17   Zelda Hickman, PA-C    Current Facility-Administered Medications  Medication Dose Route Frequency Provider Last Rate Last Admin   acetaminophen  (TYLENOL ) suppository 650 mg  650 mg Rectal Q4H PRN Rust-Chester, Britton L, NP   650 mg at 06/30/23 2052   aspirin  chewable tablet 81 mg  81 mg Per Tube Daily Hudson, Caralyn, PA-C   81 mg at 07/02/23 1047   budesonide  (PULMICORT ) nebulizer solution 0.5 mg  0.5 mg Nebulization BID Tukov-Yual, Magdalene S, NP   0.5 mg at 07/02/23 2130   Chlorhexidine  Gluconate Cloth 2 % PADS 6 each  6 each Topical Daily Kasa, Kurian, MD   6 each at 07/01/23 2106   citalopram  (CELEXA ) tablet 10 mg  10 mg Per Tube QHS Verla Glaze, MD   10 mg at 07/01/23 2104   enoxaparin  (LOVENOX ) injection 95 mg  1 mg/kg (Order-Specific) Subcutaneous Q12H Merrill, Kristin A, RPH   95 mg at 07/02/23 0414   feeding supplement (OSMOLITE 1.5 CAL) liquid 1,000 mL  1,000 mL Per Tube Continuous Sreenath, Sudheer  B, MD 60 mL/hr at 07/02/23 0430 1,000 mL at 07/02/23 0430   feeding supplement (PROSource TF20) liquid 60 mL  60 mL Per Tube Daily Sreenath, Sudheer B, MD   60 mL at 07/02/23 1102   free water  100 mL  100 mL Per Tube Q4H Sreenath, Sudheer B, MD   100 mL at 07/02/23 1142   insulin  aspart (novoLOG ) injection 0-15 Units  0-15 Units Subcutaneous Q4H Tukov-Yual, Magdalene S, NP   8 Units at 07/02/23 1115  insulin  aspart (novoLOG ) injection 4 Units  4 Units Subcutaneous Q4H Verla Glaze, MD   4 Units at 07/02/23 1047   insulin  glargine-yfgn (SEMGLEE ) injection 8 Units  8 Units Subcutaneous Q1200 Verla Glaze, MD   8 Units at 07/02/23 1357   ipratropium-albuterol  (DUONEB) 0.5-2.5 (3) MG/3ML nebulizer solution 3 mL  3 mL Nebulization TID Verla Glaze, MD   3 mL at 07/02/23 1413   metoprolol  tartrate (LOPRESSOR ) injection 5 mg  5 mg Intravenous Q6H PRN Rust-Chester, Britton L, NP       metoprolol  tartrate (LOPRESSOR ) tablet 25 mg  25 mg Per Tube BID Hudson, Caralyn, PA-C   25 mg at 07/02/23 1047   Oral care mouth rinse  15 mL Mouth Rinse Q2H Kasa, Kurian, MD   15 mL at 07/02/23 1400   Oral care mouth rinse  15 mL Mouth Rinse PRN Kasa, Kurian, MD       polyvinyl alcohol  (LIQUIFILM TEARS) 1.4 % ophthalmic solution 2 drop  2 drop Both Eyes PRN Sreenath, Sudheer B, MD       scopolamine  (TRANSDERM-SCOP) 1 MG/3DAYS 1.5 mg  1 patch Transdermal Q72H Wieting, Richard, MD   1.5 mg at 07/02/23 1358   sodium chloride  flush (NS) 0.9 % injection 10-40 mL  10-40 mL Intracatheter Q12H Wayna Hails, NP   10 mL at 07/02/23 1118   sodium chloride  flush (NS) 0.9 % injection 10-40 mL  10-40 mL Intracatheter PRN Wayna Hails, NP       thiamine  (VITAMIN B1) tablet 100 mg  100 mg Per Tube Daily Mosetta Areola, Sudheer B, MD   100 mg at 07/02/23 1047    Allergies as of 06/18/2023 - Review Complete 06/18/2023  Allergen Reaction Noted   Penicillin g Anaphylaxis 06/22/2014   Shellfish-derived products Anaphylaxis  06/22/2014   Statins  06/22/2014   Erythromycin Itching 06/22/2014   Tamsulosin  06/22/2014   Tuberculin ppd  06/22/2014   Iodinated contrast media Hives 10/29/2016     Review of Systems:    All systems reviewed and negative except where noted in HPI.  Review of Systems  Unable to perform ROS: Mental acuity       Physical Exam:  Vital signs in last 24 hours: Temp:  [97.7 F (36.5 C)-99.4 F (37.4 C)] 98.7 F (37.1 C) (04/25 1100) Pulse Rate:  [71-82] 77 (04/25 1100) Resp:  [20] 20 (04/25 0344) BP: (124-131)/(56-88) 129/62 (04/25 1100) SpO2:  [96 %-99 %] 99 % (04/25 0344) Last BM Date : 07/02/23 General:   Sleeping Head:  Normocephalic and atraumatic. Eyes:   No icterus.   Conjunctiva pink. Mouth: Mucosa pink moist, no lesions. Neck:  Supple; no masses felt Lungs: No respiratory distress  Abdomen:   Flat, soft, nondistended, nontender Msk:  Symmetrical without gross deformities. Neurologic:  Somewhat lethargic Skin:  Warm, dry, pink without significant lesions or rashes. Psych:  Alert and cooperative. Normal affect.  LAB RESULTS: Recent Labs    06/30/23 0500 07/02/23 0515  WBC 12.0* 9.5  HGB 10.5* 10.4*  HCT 32.2* 30.8*  PLT 347 404*   BMET Recent Labs    06/30/23 0500 07/01/23 0505 07/02/23 0515  NA 135 136 131*  K 4.4 4.3 4.4  CL 101 100 97*  CO2 28 29 27   GLUCOSE 259* 211* 227*  BUN 37* 37* 37*  CREATININE 1.44* 1.52* 1.37*  CALCIUM 8.6* 8.2* 8.5*   LFT No results for input(s): "PROT", "ALBUMIN ", "AST", "ALT", "ALKPHOS", "BILITOT", "BILIDIR", "IBILI" in the last  72 hours. PT/INR No results for input(s): "LABPROT", "INR" in the last 72 hours.  STUDIES: US  Venous Img Upper Uni Right(DVT) Result Date: 07/01/2023 CLINICAL DATA:  Right upper extremity swelling EXAM: RIGHT UPPER EXTREMITY VENOUS DOPPLER ULTRASOUND TECHNIQUE: Gray-scale sonography with graded compression, as well as color Doppler and duplex ultrasound were performed to evaluate the  upper extremity deep venous system from the level of the subclavian vein and including the jugular, axillary, basilic, radial, ulnar and upper cephalic vein. Spectral Doppler was utilized to evaluate flow at rest and with distal augmentation maneuvers. COMPARISON:  None available FINDINGS: Contralateral Subclavian Vein: Respiratory phasicity is normal and symmetric with the symptomatic side. No evidence of thrombus. Normal compressibility. Internal Jugular Vein: Echogenic linear intraluminal filling defect within the right internal jugular vein suspicious for nonocclusive chronic thrombus. Subclavian Vein: No evidence of thrombus. Normal compressibility, respiratory phasicity and response to augmentation. Axillary Vein: No evidence of thrombus. Normal compressibility, respiratory phasicity and response to augmentation. Cephalic Vein: Not visualized. Basilic Vein: No evidence of thrombus. Normal compressibility, respiratory phasicity and response to augmentation. Brachial Veins: No evidence of thrombus. Normal compressibility, respiratory phasicity and response to augmentation. Radial Veins: No evidence of thrombus. Normal compressibility, respiratory phasicity and response to augmentation. Ulnar Veins: No evidence of thrombus. Normal compressibility, respiratory phasicity and response to augmentation. Venous Reflux:  None visualized. Other Findings:  None visualized. IMPRESSION: 1.  No evidence of DVT within the right upper extremity. 2. 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 These results will be called to the ordering clinician or representative by the Radiologist Assistant, and communication documented in the PACS or [employer]. Electronically Signed   By: Elester Grim M.D.   On: 07/01/2023 13:31   DG Chest Port 1 View Result Date: 07/01/2023 CLINICAL DATA:  Cough EXAM: PORTABLE CHEST 1 VIEW COMPARISON:  06/28/2023 FINDINGS: Right Port-A-Cath remains in place, unchanged. Heart  mediastinal contours within normal limits. Patchy bilateral mid and lower lung opacities, similar to prior study. No visible significant effusions. No acute bony abnormality. IMPRESSION: Stable patchy bilateral mid and lower lung opacities. Electronically Signed   By: Janeece Mechanic M.D.   On: 07/01/2023 08:20   MR BRAIN W WO CONTRAST Result Date: 07/01/2023 CLINICAL DATA:  Metastatic disease evaluation EXAM: MRI HEAD WITHOUT AND WITH CONTRAST TECHNIQUE: Multiplanar, multiecho pulse sequences of the brain and surrounding structures were obtained without and with intravenous contrast. CONTRAST:  9mL GADAVIST  GADOBUTROL  1 MMOL/ML IV SOLN COMPARISON:  05/06/2017, 06/24/2023 FINDINGS: Motion degraded examination. Brain: No acute infarct, mass effect or extra-axial collection. Chronic hemorrhage at the right middle cerebellar peduncle. Bilateral basal ganglia mineralization/siderosis. There is confluent hyperintense T2-weighted signal within the white matter. Generalized volume loss. The midline structures are normal. There are no visible contrast-enhancing lesions, but the postcontrast images are severely motion degraded. Vascular: Allowing for motion, normal flow voids. Skull and upper cervical spine: Normal calvarium and skull base. Visualized upper cervical spine and soft tissues are normal. Sinuses/Orbits:No paranasal sinus fluid levels or advanced mucosal thickening. Right mastoid effusion. Normal orbits. IMPRESSION: 1. Motion degraded examination. 2. No visible contrast-enhancing lesions, but the postcontrast images are severely motion degraded. 3. Findings of chronic small vessel ischemia and volume loss. Electronically Signed   By: Juanetta Nordmann M.D.   On: 07/01/2023 00:54       Impression / Plan:   83 y.o. gentleman with history of COPD, Hep C (treated with Harvoni), and lung cancer here with respiratory failure and  septic shock from pneumonia who is pretty deconditioned from ICU stay. Consulted for PEG  tube. No urgent indication so will see how he does over the weekend and re-evaluate at the beginning of the week  - continue NG tube - nutrition recs - will continue to follow  Olivia Bevel MD, MPH [facility] GI

## 2023-07-02 NOTE — Progress Notes (Signed)
 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    4/18: Care transferred to [facility] 4/21: NGT placed on 4/20 for initiation of tube feeds.  Patient was tolerating however tube may have been dislodged overnight.  Coarse breath sounds and tube was found to be coiled up in the back of the throat.  First tube discontinued and new tube replaced with assistance of ICU charge RN. 4/22.  Tube feed resumed after NG tube confirmed in place. 4/23.  Patient lethargic.  Will get an MRI of the brain with and without contrast has motion degradation but did not see any acute stroke or mass. 4/24.  Patient seen with wife at the bedside.  Patient was able to open his eyes with sternal rub but had try to answer a few questions and shook his head and was able to squeeze my hand.  Wife wants me to limit medications that cause altered mental status and wanted me to decrease his dose of Celexa . 4/25.  Patient able to talk to me today.  Patient able to follow some simple commands.  Patient stated he feel terrible but could not elaborate.  Assessment and Plan: * Acute hypoxic respiratory failure (HCC) Patient required intubation during the hospital course and extubated on 4/17.  On 4/20 was weaned to 3 L nasal cannula.  Patient completed course of antibiotics.  Currently on nasal cannula taper if we can.  Continue nebulizers and add scopolamine  patch.  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  Patient  does have a port.  Appeared chronic as per radiologist.  Will prescribe Lovenox  twice daily for now.  Will hold this for G-tube placement.  Whenever this is scheduled.  Acute metabolic encephalopathy Mental status improved from yesterday.  Patient able to follow some simple commands and talk to me.  Patient able to lift up his arms up off the bed.  NSTEMI (non-ST elevated myocardial infarction) (HCC) Likely from septic shock.  Completed 72 hours of heparin  drip.  Cardiology not planning on any intervention.  Echocardiogram showing a normal EF at 50%.  AKI (acute kidney injury) (HCC) Creatinine 1.95 on presentation.  Last creatinine 1.37.  I suspect patient may have underlying chronic kidney disease stage IIIa.  Small cell lung cancer [facility]) CT chest concerning for cancer recurrence.  Will likely need further workup.  MRI of the brain did not show any masses.  Septic shock (HCC) Secondary to multifocal pneumonia.  This has been treated with antibiotics.  Patient did have a fever 2 days ago but procalcitonin was 0.25 so I will hold off on further antibiotics.  If he spikes another fever we will get blood cultures.  Repeat x-ray did show multifocal pneumonia but previous x-ray did show this also.        Subjective: Patient have his eyes open and was following some simple commands.  Feels terrible but cannot elaborate.  Was able to wiggle his toes and lift up arms.  Physical Exam: Vitals:   07/01/23 1958 07/01/23  2105 07/02/23 0344 07/02/23 1100  BP: (!) 124/56 131/88 (!) 129/59 129/62  Pulse: 71 82 73 77  Resp: 20  20   Temp: 99.4 F (37.4 C)  97.7 F (36.5 C) 98.7 F (37.1 C)  TempSrc: Oral  Oral Axillary  SpO2: 96%  99%   Weight:      Height:       Physical Exam HENT:     Head: Normocephalic.  Eyes:     General: Lids are normal.     Conjunctiva/sclera: Conjunctivae normal.  Cardiovascular:     Rate and Rhythm: Normal rate and regular rhythm.     Heart sounds: Normal heart  sounds, S1 normal and S2 normal.  Pulmonary:     Breath sounds: Transmitted upper airway sounds present. Examination of the right-lower field reveals decreased breath sounds and rhonchi. Examination of the left-lower field reveals decreased breath sounds and rhonchi. Decreased breath sounds and rhonchi present. No wheezing or rales.  Abdominal:     Palpations: Abdomen is soft.     Tenderness: There is no abdominal tenderness.  Musculoskeletal:     Right upper arm: Swelling present.     Right lower leg: No swelling.     Left lower leg: No swelling.  Skin:    General: Skin is warm.     Findings: No rash.  Neurological:     Mental Status: He is alert.     Comments: Patient able to answer few questions.  Patient able to follow some simple commands by wiggling his toes and lifting his arms up.     Data Reviewed: Sodium 131, creatinine 1.37, white blood count 9.5, hemoglobin 10.4, platelet count 404  Family Communication: Wife at the bedside  Disposition: Status is: Inpatient Remains inpatient appropriate because: Patient's wife agreeable to feeding tube.  Consulted GI.  Planned Discharge Destination: Home with Home Health    Time spent: 28 minutes  Author: Verla Glaze, MD 07/02/2023 2:03 PM  For on call review [email or web address].

## 2023-07-03 ENCOUNTER — Inpatient Hospital Stay

## 2023-07-03 DIAGNOSIS — I214 Non-ST elevation (NSTEMI) myocardial infarction: Secondary | ICD-10-CM | POA: Diagnosis not present

## 2023-07-03 DIAGNOSIS — Z515 Encounter for palliative care: Secondary | ICD-10-CM

## 2023-07-03 DIAGNOSIS — I82621 Acute embolism and thrombosis of deep veins of right upper extremity: Secondary | ICD-10-CM | POA: Diagnosis not present

## 2023-07-03 DIAGNOSIS — J9601 Acute respiratory failure with hypoxia: Secondary | ICD-10-CM

## 2023-07-03 DIAGNOSIS — E1165 Type 2 diabetes mellitus with hyperglycemia: Secondary | ICD-10-CM

## 2023-07-03 DIAGNOSIS — A419 Sepsis, unspecified organism: Secondary | ICD-10-CM

## 2023-07-03 DIAGNOSIS — J189 Pneumonia, unspecified organism: Secondary | ICD-10-CM

## 2023-07-03 DIAGNOSIS — G9341 Metabolic encephalopathy: Secondary | ICD-10-CM | POA: Diagnosis not present

## 2023-07-03 LAB — GLUCOSE, CAPILLARY
Glucose-Capillary: 104 mg/dL — ABNORMAL HIGH (ref 70–99)
Glucose-Capillary: 131 mg/dL — ABNORMAL HIGH (ref 70–99)
Glucose-Capillary: 149 mg/dL — ABNORMAL HIGH (ref 70–99)
Glucose-Capillary: 196 mg/dL — ABNORMAL HIGH (ref 70–99)
Glucose-Capillary: 198 mg/dL — ABNORMAL HIGH (ref 70–99)
Glucose-Capillary: 209 mg/dL — ABNORMAL HIGH (ref 70–99)
Glucose-Capillary: 214 mg/dL — ABNORMAL HIGH (ref 70–99)

## 2023-07-03 LAB — CBC
HCT: 29.9 % — ABNORMAL LOW (ref 39.0–52.0)
Hemoglobin: 10.1 g/dL — ABNORMAL LOW (ref 13.0–17.0)
MCH: 31.7 pg (ref 26.0–34.0)
MCHC: 33.8 g/dL (ref 30.0–36.0)
MCV: 93.7 fL (ref 80.0–100.0)
Platelets: 430 10*3/uL — ABNORMAL HIGH (ref 150–400)
RBC: 3.19 MIL/uL — ABNORMAL LOW (ref 4.22–5.81)
RDW: 14.6 % (ref 11.5–15.5)
WBC: 9.2 10*3/uL (ref 4.0–10.5)
nRBC: 0 % (ref 0.0–0.2)

## 2023-07-03 LAB — CREATININE, SERUM
Creatinine, Ser: 1.47 mg/dL — ABNORMAL HIGH (ref 0.61–1.24)
GFR, Estimated: 47 mL/min — ABNORMAL LOW (ref >60)

## 2023-07-03 MED ORDER — FUROSEMIDE 10 MG/ML IJ SOLN
20.0000 mg | Freq: Once | INTRAMUSCULAR | Status: AC
  Administered 2023-07-03: 20 mg via INTRAVENOUS
  Filled 2023-07-03: qty 4

## 2023-07-03 MED ORDER — SODIUM CHLORIDE 0.9 % IV SOLN: INTRAVENOUS | Status: DC

## 2023-07-03 NOTE — Progress Notes (Signed)
 Progress Note   Patient: Edwin Jones UEA:[id number] DOB: 05-05-1940 DOA: 06/18/2023     15 DOS: the patient was seen and examined on 07/03/2023   Brief hospital course: 83 year old male patient with a past medical history of limited small cell lung cancer diagnosed in 2018 status post chemoradiation in remission with recent suspicion for recurrence was 4 or FDG avid on PET scan done in late 2024 presenting to [facility] on 0/11 with acute hypoxic respiratory failure secondary to community-acquired pneumonia complicated by septic shock.    4/18: Care transferred to [facility] 4/21: NGT placed on 4/20 for initiation of tube feeds.  Patient was tolerating however tube may have been dislodged overnight.  Coarse breath sounds and tube was found to be coiled up in the back of the throat.  First tube discontinued and new tube replaced with assistance of ICU charge RN. 4/22.  Tube feed resumed after NG tube confirmed in place. 4/23.  Patient lethargic.  MRI of the brain with and without contrast has motion degradation but did not see any acute stroke or mass. 4/24.  Patient seen with wife at the bedside.  Patient was able to open his eyes with sternal rub but had try to answer a few questions and shook his head and was able to squeeze my hand.  Wife wants me to limit medications that cause altered mental status and wanted me to decrease his dose of Celexa . 4/25.  Patient able to talk to me today.  Patient able to follow some simple commands.  Patient stated he feel terrible but could not elaborate. 4/26.  Patient pulled out the Dobbhoff tube last night.  NG tube replaced and in proper position.  Will restart tube feeds.  Patient's wife gave him a sip of water  and he did not choke.  Case discussed with speech pathology and they will set up for another modified test on Monday since his mental status has improved over the last couple days.  Assessment and Plan: * Acute hypoxic respiratory failure (HCC) Patient required  intubation during the hospital course and extubated on 4/17.  On 4/20 was weaned to 3 L nasal cannula.  Patient completed course of antibiotics.  Currently on nasal cannula taper if we can.  Continue nebulizers and added scopolamine  patch.  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  Patient does have a port.  Appeared chronic as per radiologist.  Will prescribe Lovenox  twice daily for now.  Will have to hold if any procedures are going to be done.  Acute metabolic encephalopathy Mental status slowly improving on a day-to-day basis.  Patients wife would like another swallow evaluation.  Will set up for a modified swallow eval on Monday.  Will see how he does with ice chips today.  NSTEMI (non-ST elevated myocardial infarction) (HCC) Likely from septic shock.  Completed 72 hours of heparin  drip.  Cardiology not planning on any intervention.  Echocardiogram showing a normal EF at 50%.  AKI (acute kidney injury) (HCC) Creatinine 1.95 on presentation.  Last creatinine 1.47.  I suspect patient may have underlying chronic kidney disease stage IIIa.  I started IV fluids this morning when NG tube came out but will stop them since we are restarting tube feeds.  Small cell lung cancer [facility]) CT chest concerning for cancer recurrence.  Will likely need further workup.  MRI of the brain did not show any masses.  Septic shock (HCC) Secondary to multifocal pneumonia.  This has been treated  with antibiotics.  Patient did have a fever on 4/23 ago but procalcitonin was 0.25 so I will hold off on further antibiotics.  Repeat x-ray did show multifocal pneumonia but previous x-ray did show this also.  Uncontrolled type 2 diabetes mellitus with hyperglycemia, without long-term current use of insulin  (HCC) Last hemoglobin A1c 7.2.  Patient on sliding scale insulin  and 8 units of Semglee  insulin  daily        Subjective: Patient pulled out his Dobbhoff tube last night.  We  were able to put in an NG tube this morning and will restart tube feeds.  Patient more alert today than the first day I saw her.  Following commands.  Able to lift up his arms.  Answer some questions and able to talk a little bit.  Patient's wife gave him a sip of water  and he did not choke.  Physical Exam: Vitals:   07/03/23 0400 07/03/23 0515 07/03/23 0841 07/03/23 1412  BP: 126/85  117/60   Pulse: 70  (!) 104   Resp: 20  14   Temp: 99.7 F (37.6 C)     TempSrc:      SpO2: 100% 96% 100% 94%  Weight:      Height:       Physical Exam HENT:     Head: Normocephalic.  Eyes:     General: Lids are normal.     Conjunctiva/sclera: Conjunctivae normal.  Cardiovascular:     Rate and Rhythm: Normal rate and regular rhythm.     Heart sounds: Normal heart sounds, S1 normal and S2 normal.  Pulmonary:     Breath sounds: Transmitted upper airway sounds present. Examination of the right-lower field reveals decreased breath sounds. Examination of the left-lower field reveals decreased breath sounds. Decreased breath sounds present. No wheezing, rhonchi or rales.  Abdominal:     Palpations: Abdomen is soft.     Tenderness: There is no abdominal tenderness.  Musculoskeletal:     Right upper arm: Swelling present.     Right lower leg: No swelling.     Left lower leg: No swelling.  Skin:    General: Skin is warm.     Findings: No rash.  Neurological:     Mental Status: He is alert.     Comments: Patient able to answer few questions.  Patient able to follow some simple commands by wiggling his toes and lifting his arms up.     Data Reviewed: Creatinine 1.47, hemoglobin 10.1, platelet count 430, white blood cell count 9.2  Family Communication: Wife at the bedside  Disposition: Status is: Inpatient Remains inpatient appropriate because: Since patient more alert patient's wife wanted to get a speech reevaluation.  Spoke with speech therapy and they will set up for another modified test on  Monday.  Planned Discharge Destination: Home with Home Health    Time spent: 28 minutes  Author: Verla Glaze, MD 07/03/2023 3:01 PM  For on call review [email or web address].

## 2023-07-03 NOTE — Progress Notes (Signed)
 Daily Progress Note   Patient Name: Edwin Jones      Date: 07/03/2023 DOB: 1940/08/20  Age: 83 y.o. MRN#: [id number] Attending Physician: Verla Glaze, MD Primary Care Physician: Macie Saxon, MD Admit Date: 06/18/2023  Reason for Consultation/Follow-up: Establishing goals of care  HPI/Brief Hospital Review: 83 y.o. 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   Admitted initially to the ICU treated for multifocal pneumonia with septic shock requiring mechanical ventilation from 4/11 - 4/16 Transferred to service to [facility] 4/17   Palliative medicine was consulted for assisting with goals of care conversations  Subjective: Extensive chart review has been completed prior to meeting patient including labs, vital signs, imaging, progress notes, orders, and available advanced directive documents from current and previous encounters.    Visited with Edwin Jones at his bedside.  He is resting in bed with his eyes closed but opens his eyes to calling of his name, he is able to follow simple commands, he is able to nod appropriately.  He denies acute pain or discomfort.  No family at bedside during initial visit.  Return to bedside later in the afternoon when wife Edwin Jones at bedside visiting.  Discussed with Edwin Jones events that occurred over the week since our last visit.  Edwin Jones shares due to the power of prior [name]  has improved and continues to improve and she remains hopeful for continued recovery.  We discussed plan for speech therapy to reevaluate early next week with hopes that he can be placed on a safe p.o. diet to avoid G-tube placement.  She shares that she would continue to be interested in G-tube placement if he is unable to pass a swallow study.  She again alludes to the many times providers have discussed CODE STATUS with her and that again with the power of prior Mr. Kick has been able to recover.  She remains clear that she is open to any and all care available to  [name].  Answered and addressed all questions and concerns.  PMT to continue to follow for ongoing needs and support.  Objective:  Physical Exam Constitutional:      General: He is not in acute distress.    Appearance: He is ill-appearing.  Pulmonary:     Effort: Pulmonary effort is normal. No respiratory distress.  Skin:    General: Skin is warm and dry.  Neurological:     Mental Status: He is alert.     Motor: Weakness present.             Vital Signs: BP 117/60 (BP Location: Left Arm)   Pulse (!) 104   Temp 99.7 F (37.6 C)   Resp 14   Ht 6\' 3"  (1.905 m)   Wt 93 kg   SpO2 94%   BMI 25.63 kg/m  SpO2: SpO2: 94 % O2 Device: O2 Device: Nasal Cannula O2 Flow Rate: O2 Flow Rate (L/min): 3 L/min   Palliative Care Assessment & Plan   Assessment/Recommendation/Plan  Full Code-Full Scope Time for outcomes Repeat SLP evaluation on Monday  Thank you for allowing the Palliative Medicine Team to assist in the care of this patient.  Total time:  50 minutes  Time spent includes: Detailed review of medical records (labs, imaging, vital signs), medically appropriate exam (mental status, respiratory, cardiac, skin), discussed with treatment team, counseling and educating patient, family and staff, documenting clinical information, medication management and coordination of care.  [name], DNP, AGNP-C Palliative  Medicine   Please contact Palliative Medicine Team phone at [phone] for questions and concerns.

## 2023-07-03 NOTE — Assessment & Plan Note (Signed)
 Last hemoglobin A1c 7.2.  Since patient will be put on a diet I will change sliding scale to before meals and at bedtime.  I will also cut back on long-acting insulin  to avoid low sugar.

## 2023-07-03 NOTE — Progress Notes (Signed)
 Called wife to update her on patient (she called 30 minutes before this for update which I gave - he was fine at that time) on her cellphone (no voicemail set up) and home phone (not in service). Despite wearing mittens, patient managed to pull out his dobhoff tubing. Tube feeding stopped and per [facility], pulled rest of tube (which was barely hanging in) out.

## 2023-07-03 NOTE — Progress Notes (Signed)
 GI Inpatient Follow-up Note  Subjective:  Patient seen and was able to answer some questions. He pulled out his ng tube overnight. Briefly spoke with palliative care np and apparently he is pretty close to his baseline.  Scheduled Inpatient Medications:   aspirin   81 mg Per Tube Daily   budesonide  (PULMICORT ) nebulizer solution  0.5 mg Nebulization BID   Chlorhexidine  Gluconate Cloth  6 each Topical Daily   citalopram   10 mg Per Tube QHS   enoxaparin  (LOVENOX ) injection  1 mg/kg (Order-Specific) Subcutaneous Q12H   feeding supplement (PROSource TF20)  60 mL Per Tube Daily   free water   100 mL Per Tube Q4H   insulin  aspart  0-15 Units Subcutaneous Q4H   insulin  aspart  4 Units Subcutaneous Q4H   insulin  glargine-yfgn  8 Units Subcutaneous Q1200   ipratropium-albuterol   3 mL Nebulization TID   metoprolol  tartrate  25 mg Per Tube BID   mouth rinse  15 mL Mouth Rinse Q2H   scopolamine   1 patch Transdermal Q72H   sodium chloride  flush  10-40 mL Intracatheter Q12H   thiamine   100 mg Per Tube Daily    Continuous Inpatient Infusions:    sodium chloride      feeding supplement (OSMOLITE 1.5 CAL) Stopped (07/03/23 0215)    PRN Inpatient Medications:  acetaminophen , metoprolol  tartrate, mouth rinse, polyvinyl alcohol , sodium chloride  flush  Review of Systems:  Review of Systems  Reason unable to perform ROS: Mental status.      Physical Examination: BP 117/60 (BP Location: Left Arm)   Pulse (!) 104   Temp 99.7 F (37.6 C)   Resp 14   Ht 6\' 3"  (1.905 m)   Wt 93 kg   SpO2 100%   BMI 25.63 kg/m  Gen: NAD, alert to person HEENT: PEERLA, EOMI, Neck: supple, no JVD or thyromegaly Chest: No respiratory distress Abd: soft, non-tender, non-distended Ext: no edema, well perfused with 2+ pulses, Skin: no rash or lesions noted Lymph: no LAD  Data: Lab Results  Component Value Date   WBC 9.2 07/03/2023   HGB 10.1 (L) 07/03/2023   HCT 29.9 (L) 07/03/2023   MCV 93.7 07/03/2023    PLT 430 (H) 07/03/2023   Recent Labs  Lab 06/30/23 0500 07/02/23 0515 07/03/23 0433  HGB 10.5* 10.4* 10.1*   Lab Results  Component Value Date   NA 131 (L) 07/02/2023   K 4.4 07/02/2023   CL 97 (L) 07/02/2023   CO2 27 07/02/2023   BUN 37 (H) 07/02/2023   CREATININE 1.47 (H) 07/03/2023   Lab Results  Component Value Date   ALT 10 06/18/2023   AST 20 06/18/2023   ALKPHOS 62 06/18/2023   BILITOT 0.6 06/18/2023   No results for input(s): "APTT", "INR", "PTT" in the last 168 hours. Assessment/Plan: Edwin Jones is a 83 y.o. gentleman with history of COPD, Hep C (treated with Harvoni), and lung cancer here with respiratory failure and septic shock from pneumonia who is pretty deconditioned from ICU stay. Consulted for PEG tube. No urgent indication so will see how he does over the weekend and re-evaluate at the beginning of the week   Recommendations:  - strongly encourage palliative care discussions regarding PEG tube as if he is close to his baseline currently, then would not recommend PEG tube - replace NG tube - ok to hold lovenox  tomorrow - will continue to follow, please call with any questions or concerns. Will attempt to speak with patient's wife  Edwin Bevel  MD, MPH [facility] GI

## 2023-07-04 DIAGNOSIS — I82621 Acute embolism and thrombosis of deep veins of right upper extremity: Secondary | ICD-10-CM | POA: Diagnosis not present

## 2023-07-04 DIAGNOSIS — A419 Sepsis, unspecified organism: Secondary | ICD-10-CM | POA: Diagnosis not present

## 2023-07-04 DIAGNOSIS — Z515 Encounter for palliative care: Secondary | ICD-10-CM | POA: Diagnosis not present

## 2023-07-04 DIAGNOSIS — G9341 Metabolic encephalopathy: Secondary | ICD-10-CM | POA: Diagnosis not present

## 2023-07-04 DIAGNOSIS — J189 Pneumonia, unspecified organism: Secondary | ICD-10-CM | POA: Diagnosis not present

## 2023-07-04 DIAGNOSIS — I214 Non-ST elevation (NSTEMI) myocardial infarction: Secondary | ICD-10-CM | POA: Diagnosis not present

## 2023-07-04 DIAGNOSIS — J9601 Acute respiratory failure with hypoxia: Secondary | ICD-10-CM | POA: Diagnosis not present

## 2023-07-04 LAB — BASIC METABOLIC PANEL WITH GFR
Anion gap: 9 (ref 5–15)
BUN: 49 mg/dL — ABNORMAL HIGH (ref 8–23)
CO2: 28 mmol/L (ref 22–32)
Calcium: 8.8 mg/dL — ABNORMAL LOW (ref 8.9–10.3)
Chloride: 96 mmol/L — ABNORMAL LOW (ref 98–111)
Creatinine, Ser: 1.53 mg/dL — ABNORMAL HIGH (ref 0.61–1.24)
GFR, Estimated: 45 mL/min — ABNORMAL LOW (ref >60)
Glucose, Bld: 228 mg/dL — ABNORMAL HIGH (ref 70–99)
Potassium: 4.8 mmol/L (ref 3.5–5.1)
Sodium: 133 mmol/L — ABNORMAL LOW (ref 135–145)

## 2023-07-04 LAB — GLUCOSE, CAPILLARY
Glucose-Capillary: 143 mg/dL — ABNORMAL HIGH (ref 70–99)
Glucose-Capillary: 146 mg/dL — ABNORMAL HIGH (ref 70–99)
Glucose-Capillary: 162 mg/dL — ABNORMAL HIGH (ref 70–99)
Glucose-Capillary: 172 mg/dL — ABNORMAL HIGH (ref 70–99)
Glucose-Capillary: 176 mg/dL — ABNORMAL HIGH (ref 70–99)
Glucose-Capillary: 183 mg/dL — ABNORMAL HIGH (ref 70–99)
Glucose-Capillary: 256 mg/dL — ABNORMAL HIGH (ref 70–99)

## 2023-07-04 MED ORDER — SODIUM CHLORIDE 0.9% FLUSH: 10.0000 mL | INTRAVENOUS | Status: DC | PRN

## 2023-07-04 MED ORDER — SODIUM CHLORIDE 0.9% FLUSH
10.0000 mL | Freq: Two times a day (BID) | INTRAVENOUS | Status: DC
  Administered 2023-07-04 – 2023-07-08 (×9): 10 mL

## 2023-07-04 MED ORDER — INSULIN ASPART 100 UNIT/ML IJ SOLN
4.0000 [IU] | INTRAMUSCULAR | Status: DC
  Administered 2023-07-04 – 2023-07-05 (×3): 4 [IU] via SUBCUTANEOUS
  Filled 2023-07-04 (×4): qty 1

## 2023-07-04 NOTE — Progress Notes (Signed)
 Progress Note   Patient: Edwin Jones [id number]:[id number] DOB: [birth year] DOA: 06/18/2023     16 DOS: the patient was seen and examined on 07/04/2023   Brief hospital course: 83 year old male patient with a past medical history of limited small cell lung cancer diagnosed in 2018 status post chemoradiation in remission with recent suspicion for recurrence was 4 or FDG avid on PET scan done in late 2024 presenting to [facility] on 0/11 with acute hypoxic respiratory failure secondary to community-acquired pneumonia complicated by septic shock.    4/18: Care transferred to [facility] 4/21: NGT placed on 4/20 for initiation of tube feeds.  Patient was tolerating however tube may have been dislodged overnight.  Coarse breath sounds and tube was found to be coiled up in the back of the throat.  First tube discontinued and new tube replaced with assistance of ICU charge RN. 4/22.  Tube feed resumed after NG tube confirmed in place. 4/23.  Patient lethargic.  MRI of the brain with and without contrast has motion degradation but did not see any acute stroke or mass. 4/24.  Patient seen with wife at the bedside.  Patient was able to open his eyes with sternal rub but had try to answer a few questions and shook his head and was able to squeeze my hand.  Wife wants me to limit medications that cause altered mental status and wanted me to decrease his dose of Celexa . 4/25.  Patient able to talk to me today.  Patient able to follow some simple commands.  Patient stated he feel terrible but could not elaborate. 4/26.  Patient pulled out the Dobbhoff tube last night.  NG tube replaced and in proper position.  Will restart tube feeds.  Patient's wife gave him a sip of water  and he did not choke.  Patients mental status has improved over the last couple days. 4/27.  Will hold Lovenox  injections for potential PEG tomorrow.  Assessment and Plan: * Acute hypoxic respiratory failure (HCC) Patient required intubation during the hospital  course and extubated on 4/17.  On 4/20 was weaned to 3 L nasal cannula.  Patient completed course of antibiotics.  Currently on room air.  Continue nebulizers and added scopolamine  patch.  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  Patient does have a port.  Appeared chronic as per radiologist.  Hold Lovenox  for potential PEG.  Acute metabolic encephalopathy Mental status slowly improving on a day-to-day basis.  Speech pathology recommends proceeding with feeding tube because he will not be able to keep up with his nutritional needs.  NSTEMI (non-ST elevated myocardial infarction) (HCC) Likely from septic shock.  Completed 72 hours of heparin  drip.  Cardiology not planning on any intervention.  Echocardiogram showing a normal EF at 50%.  AKI (acute kidney injury) (HCC) Creatinine 1.95 on presentation.  Last creatinine 1.53.  I suspect patient may have underlying chronic kidney disease stage IIIa.   Small cell lung cancer [facility]) CT chest concerning for cancer recurrence.  Will likely need further workup.  MRI of the brain did not show any masses.  Septic shock (HCC) Secondary to multifocal pneumonia.  This has been treated with antibiotics.  Patient did have a fever on 4/23 ago but procalcitonin was 0.25 so I will hold off on further antibiotics.  Repeat x-ray did show multifocal pneumonia but previous x-ray did show this also.  Uncontrolled type 2 diabetes mellitus with hyperglycemia, without long-term current use of insulin  (HCC) Last hemoglobin A1c  7.2.  Patient on sliding scale insulin  and 8 units of Semglee  insulin  daily        Subjective: Patient alert and looking at me answers questions and follows simple commands.  Able to straight leg raise.  Physical Exam: Vitals:   07/03/23 1920 07/03/23 2009 07/04/23 0351 07/04/23 0817  BP:  (!) 123/56 116/61 (!) 105/59  Pulse:  82 78 78  Resp:  20 20 18   Temp:  97.7 F (36.5 C) 99.5 F (37.5 C)  98.8 F (37.1 C)  TempSrc:  Oral Oral Oral  SpO2: 97% 93% 92% 96%  Weight:   95 kg   Height:       Physical Exam HENT:     Head: Normocephalic.  Eyes:     General: Lids are normal.     Conjunctiva/sclera: Conjunctivae normal.  Cardiovascular:     Rate and Rhythm: Normal rate and regular rhythm.     Heart sounds: Normal heart sounds, S1 normal and S2 normal.  Pulmonary:     Breath sounds: Transmitted upper airway sounds present. Examination of the right-lower field reveals decreased breath sounds. Examination of the left-lower field reveals decreased breath sounds. Decreased breath sounds present. No wheezing, rhonchi or rales.  Abdominal:     Palpations: Abdomen is soft.     Tenderness: There is no abdominal tenderness.  Musculoskeletal:     Right upper arm: Swelling present.     Right lower leg: No swelling.     Left lower leg: No swelling.  Skin:    General: Skin is warm.     Findings: No rash.  Neurological:     Mental Status: He is alert.     Comments: Patient able to answer few questions.  Patient able to straight leg raise.     Data Reviewed: Creatinine 1.53, sodium 133  Family Communication: Tried to reach wife on the phone but unable to leave a message  Disposition: Status is: Inpatient Remains inpatient appropriate because: Will keep n.p.o. and hold tube feeds after midnight for potential PEG  Planned Discharge Destination: Home with Home Health    Time spent: 28 minutes  Author: Verla Glaze, MD 07/04/2023 2:09 PM  For on call review [email or web address].

## 2023-07-04 NOTE — Progress Notes (Signed)
 Daily Progress Note   Patient Name: Edwin Jones       Date: 07/04/2023 DOB: Jul 18, 1940  Age: 83 y.o. MRN#: [id number] Attending Physician: Verla Glaze, MD Primary Care Physician: Macie Saxon, MD Admit Date: 06/18/2023  Reason for Consultation/Follow-up: Establishing goals of care  HPI/Brief Hospital Review: 83 y.o. 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   Admitted initially to the ICU treated for multifocal pneumonia with septic shock requiring mechanical ventilation from 4/11 - 4/16 Transferred to service to [facility] 4/17   Palliative medicine was consulted for assisting with goals of care conversations  Subjective: Extensive chart review has been completed prior to meeting patient including labs, vital signs, imaging, progress notes, orders, and available advanced directive documents from current and previous encounters.    Visited with Edwin Jones at his bedside.  He is resting in bed with eyes open, able to nod intermittently appropriately to simple questions and able to follow simple commands, wife [name] at bedside during time of visit.  Discussed with [name] conversations had an recommendations from speech therapy, recommendations to not repeat swallow eval as last eval was only 4 days ago and Mr. Salz has not had significant cognitive improvement since that time.  Discussed the difference between his  ability to swallow safely versus his ability to meet his nutritional and hydration demands.  We also discussed risks associated with PEG tube placement and chronic tube feedings.  Attempted to elicit goals of care and discussed Mr. Nolton multiple underlying comorbid conditions as well as his cognitive decline.  Attempted to discuss overall poor prognosis.  [name] becomes easily irritable and obviously not wanting to discuss prognosis or goals of care.  [name] remains adamant that we repeat swallow evaluation and again confirms that she would be interested in NG tube placement if he is unable to pass evaluation.  Answered and addressed all questions and concerns.  Palliative to again step away from daily visits I do  not return on service until next Friday and will follow-up on my return if Mr. Bloedorn remains inpatient.  Providers remain available during the week if needs or concerns arise, please feel free to reach out.  Care plan was discussed with primary team and SLP.  Thank you for allowing the Palliative Medicine Team to assist in the care of this patient.  Total time:  50 minutes  Time spent includes: Detailed review of medical records (labs, imaging, vital signs), medically appropriate exam (mental status, respiratory, cardiac, skin), discussed with treatment team, counseling and educating patient, family and staff, documenting clinical information, medication management and coordination of care.  [name], DNP, AGNP-C Palliative Medicine   Please contact Palliative Medicine Team phone at [phone] for questions and concerns.

## 2023-07-04 NOTE — Progress Notes (Signed)
 Patient's wife states that his mental status better and would like to repeat a swallowing test before considering the PEG.

## 2023-07-05 ENCOUNTER — Inpatient Hospital Stay

## 2023-07-05 DIAGNOSIS — I214 Non-ST elevation (NSTEMI) myocardial infarction: Secondary | ICD-10-CM | POA: Diagnosis not present

## 2023-07-05 DIAGNOSIS — J9601 Acute respiratory failure with hypoxia: Secondary | ICD-10-CM | POA: Diagnosis not present

## 2023-07-05 DIAGNOSIS — G9341 Metabolic encephalopathy: Secondary | ICD-10-CM | POA: Diagnosis not present

## 2023-07-05 DIAGNOSIS — I82621 Acute embolism and thrombosis of deep veins of right upper extremity: Secondary | ICD-10-CM | POA: Diagnosis not present

## 2023-07-05 LAB — GLUCOSE, CAPILLARY
Glucose-Capillary: 164 mg/dL — ABNORMAL HIGH (ref 70–99)
Glucose-Capillary: 96 mg/dL (ref 70–99)
Glucose-Capillary: 106 mg/dL — ABNORMAL HIGH (ref 70–99)
Glucose-Capillary: 112 mg/dL — ABNORMAL HIGH (ref 70–99)
Glucose-Capillary: 122 mg/dL — ABNORMAL HIGH (ref 70–99)
Glucose-Capillary: 196 mg/dL — ABNORMAL HIGH (ref 70–99)

## 2023-07-05 MED ORDER — ENSURE ENLIVE PO LIQD
237.0000 mL | Freq: Three times a day (TID) | ORAL | Status: DC
  Administered 2023-07-05 – 2023-07-08 (×8): 237 mL via ORAL

## 2023-07-05 MED ORDER — INSULIN ASPART 100 UNIT/ML IJ SOLN
0.0000 [IU] | Freq: Every day | INTRAMUSCULAR | Status: DC
  Administered 2023-07-06: 3 [IU] via SUBCUTANEOUS
  Filled 2023-07-05 (×2): qty 1

## 2023-07-05 MED ORDER — METOPROLOL TARTRATE 25 MG PO TABS
25.0000 mg | ORAL_TABLET | Freq: Two times a day (BID) | ORAL | Status: DC
  Administered 2023-07-05 – 2023-07-06 (×2): 25 mg via ORAL
  Filled 2023-07-05 (×2): qty 1

## 2023-07-05 MED ORDER — ADULT MULTIVITAMIN W/MINERALS CH
1.0000 | ORAL_TABLET | Freq: Every day | ORAL | Status: DC
Start: 2023-07-06 — End: 2023-07-08
  Administered 2023-07-06 – 2023-07-08 (×3): 1 via ORAL
  Filled 2023-07-05 (×3): qty 1

## 2023-07-05 MED ORDER — ENOXAPARIN SODIUM 100 MG/ML IJ SOSY
1.0000 mg/kg | PREFILLED_SYRINGE | Freq: Two times a day (BID) | INTRAMUSCULAR | Status: DC
  Administered 2023-07-05 – 2023-07-06 (×2): 92.5 mg via SUBCUTANEOUS
  Filled 2023-07-05 (×3): qty 1

## 2023-07-05 MED ORDER — CITALOPRAM HYDROBROMIDE 20 MG PO TABS
10.0000 mg | ORAL_TABLET | Freq: Every day | ORAL | Status: DC
  Administered 2023-07-05 – 2023-07-07 (×3): 10 mg via ORAL
  Filled 2023-07-05 (×3): qty 1

## 2023-07-05 MED ORDER — INSULIN ASPART 100 UNIT/ML IJ SOLN
0.0000 [IU] | Freq: Three times a day (TID) | INTRAMUSCULAR | Status: DC
  Administered 2023-07-05: 1 [IU] via SUBCUTANEOUS
  Administered 2023-07-06: 2 [IU] via SUBCUTANEOUS
  Administered 2023-07-06: 5 [IU] via SUBCUTANEOUS
  Administered 2023-07-07 (×2): 3 [IU] via SUBCUTANEOUS
  Administered 2023-07-07 – 2023-07-08 (×2): 2 [IU] via SUBCUTANEOUS
  Administered 2023-07-08: 3 [IU] via SUBCUTANEOUS
  Filled 2023-07-05 (×7): qty 1

## 2023-07-05 NOTE — Anesthesia Preprocedure Evaluation (Signed)
 Anesthesia Evaluation  Patient identified by MRN, date of birth, ID band Patient confused    Reviewed: Allergy & Precautions, NPO status , Patient's Chart, lab work & pertinent test results  History of Anesthesia Complications Negative for: history of anesthetic complications  Airway Mallampati: II  TM Distance: >3 FB Neck ROM: full    Dental  (+) Missing   Pulmonary shortness of breath and with exertion, pneumonia, COPD, former smoker   Pulmonary exam normal        Cardiovascular hypertension, (-) angina + Past MI and + Peripheral Vascular Disease  Normal cardiovascular exam     Neuro/Psych CVA  negative psych ROS   GI/Hepatic ,GERD  Controlled,,(+) Hepatitis -  Endo/Other  negative endocrine ROSdiabetes    Renal/GU Renal disease     Musculoskeletal   Abdominal   Peds  Hematology negative hematology ROS (+)   Anesthesia Other Findings Past Medical History: No date: Cancer (HCC) No date: COPD (chronic obstructive pulmonary disease) (HCC) No date: Diabetes mellitus without complication (HCC) No date: GERD without esophagitis No date: Hepatitis C virus     Comment:  history of hep C treated with Harvoni No date: Hypercholesterolemia No date: Hypertension No date: Seasonal allergies  Past Surgical History: No date: COLONOSCOPY     Comment:  approximately 11 years ago 11/05/2016: FLEXIBLE BRONCHOSCOPY; N/A     Comment:  Procedure: FLEXIBLE BRONCHOSCOPY;  Surgeon: Valiant Gaul, MD;  Location: [facility] ORS;  Service: Pulmonary;                Laterality: N/A; 05/06/2017: IR FLUORO GUIDE CV LINE RIGHT 05/06/2017: IR INTRAVSC STENT CERV CAROTID W/O EMB-PROT MOD SED INC  ANGIO 05/06/2017: IR PERCUTANEOUS ART THROMBECTOMY/INFUSION INTRACRANIAL INC  DIAG ANGIO 05/06/2017: IR US  GUIDE VASC ACCESS LEFT 05/06/2017: IR US  GUIDE VASC ACCESS RIGHT 05/06/2017: IR US  GUIDE VASC ACCESS RIGHT 11/23/2016: PORTA CATH  INSERTION; N/A     Comment:  Procedure: Porta Cath Insertion;  Surgeon: Celso College,              MD;  Location: [facility] INVASIVE CV LAB;  Service:               Cardiovascular;  Laterality: N/A; 05/06/2017: RADIOLOGY WITH ANESTHESIA; N/A     Comment:  Procedure: IR WITH ANESTHESIA;  Surgeon: Radiologist,               Medication, MD;  Location: [facility] OR;  Service: Radiology;                Laterality: N/A; 05/10/2017: TEE WITHOUT CARDIOVERSION; N/A     Comment:  Procedure: TRANSESOPHAGEAL ECHOCARDIOGRAM (TEE);                Surgeon: Luana Rumple, MD;  Location: [facility] END[facility];                Service: Cardiovascular;  Laterality: N/A; No date: TONSILLECTOMY     Reproductive/Obstetrics negative OB ROS                             Anesthesia Physical Anesthesia Plan  ASA: 3  Anesthesia Plan: General ETT   Post-op Pain Management:    Induction: Intravenous  PONV Risk Score and Plan: Ondansetron , Dexamethasone , Midazolam  and Treatment may vary due to age or medical condition  Airway Management Planned: Oral ETT  Additional  Equipment:   Intra-op Plan:   Post-operative Plan: Extubation in OR and Possible Post-op intubation/ventilation  Informed Consent: I have reviewed the patients History and Physical, chart, labs and discussed the procedure including the risks, benefits and alternatives for the proposed anesthesia with the patient or authorized representative who has indicated his/her understanding and acceptance.     Dental Advisory Given  Plan Discussed with: Anesthesiologist, CRNA and Surgeon  Anesthesia Plan Comments: (History and phone consent from the patients wife Chramaine Hileman at [phone]  Spouse consented for risks of anesthesia including but not limited to:  - adverse reactions to medications - damage to eyes, teeth, lips or other oral mucosa - nerve damage due to positioning  - sore throat or hoarseness - Damage to heart, brain,  nerves, lungs, other parts of body or loss of life  She voiced understanding and assent.)       Anesthesia Quick Evaluation

## 2023-07-05 NOTE — Telephone Encounter (Signed)
 Received new fax concerning this issue on 06/30/23.

## 2023-07-05 NOTE — Progress Notes (Signed)
 Nutrition Follow Up Note   DOCUMENTATION CODES:   Not applicable  INTERVENTION:   Ensure Enlive po TID, each supplement provides 350 kcal and 20 grams of protein.  Magic cup TID with meals, each supplement provides 290 kcal and 9 grams of protein  MVI po daily   Pt remains at refeed risk; recommend monitor potassium, magnesium  and phosphorus labs daily until stable  If G-tube placed, recommend:  Osmolite 1.5- Give 6 cartons daily via tube. Begin with 1/2 carton and advance as tolerated. Flush with 50ml of water  before and after each feeding.   Regimen provides 2130kcal/day, 90g/day protein and 1677ml/day of free water .   NUTRITION DIAGNOSIS:   Inadequate oral intake related to inability to eat as evidenced by NPO status. 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  Pt again removed NGT which was replaced 4/26. NGT has now been removed. Pt seen by SLP today and was placed on a pureed diet. Pt's wife wanting to hold on G-tube placement at this time to see if patient will be able to eat enough to avoid tube placement. RD will add supplements and MVI to help pt meet his estimated needs. Pt remains at refeed risk. Per chart, pt is weight stable. Recommend G-tube placement if patient is not able to meet his estimated needs via oral intake and if family wants to continue with aggressive care.   Medications reviewed and include: aspirin , celexa , lovenox , insulin , scopolamine   Labs reviewed: Na 133(L), K 4.8 wnl, BUN 49(H), creat 1.53(H) Hgb 10.1(L), Hct 29.9(L) Cbgs- 122, 106, 112, 96 x 24  hrs   UOP-   Diet Order:   Diet Order             DIET - DYS 1 Room service appropriate? Yes; Fluid consistency: Thin  Diet effective now                  EDUCATION NEEDS:   No education needs have been identified at this time  Skin:  Skin Assessment: Reviewed RN Assessment  Last BM:  4/28- type 6  Height:   Ht Readings from Last 1 Encounters:  06/22/23 6\' 3"  (1.905 m)    Weight:   Wt Readings from Last 1 Encounters:  07/05/23 93 kg    Ideal Body Weight:  89.1 kg  BMI:  Body mass index is 25.63 kg/m.  Estimated Nutritional Needs:   Kcal:  2200-2500kcal/day  Protein:  110-125g/day  Fluid:  2.2-2.5L/day  Torrance Freestone MS, RD, LDN If unable to be reached, please send secure chat to "RD inpatient" available from 8:00a-4:00p daily

## 2023-07-05 NOTE — Progress Notes (Signed)
 Physical Therapy Treatment Patient Details Name: Edwin Jones MRN: [id number] DOB: [birth year] Today's Date: 07/05/2023   History of Present Illness Pt is an 83 y/o M admitted on 06/18/23 after presenting to the ED with c/o respiratory distress. Pt required intubation, found to have multifocal PNA with sepsis. Pt also being treated for NSTEMI, septic shock. Pt extubated 06/23/23. PMH: DM2, HTN, small cell lung CA, COPD, Hep C, hypercholesterolemia, CVA with residual R sided deficits    PT Comments  Pt received supine in bed sleeping with spouse at bedside. Agreeable to PT/OT co-treat. Pt remains on totalA+2 for bed mobility but continues to demo improved static sitting at EOB for 10-15 minutes. Able to work on RUE and LUE ADL's with OT then perform posterior chain exercises to work on back strength for sitting posture for meals and other seated ADL's. Pt remains needing multimodal cuing due to being [facility] but is motivated and participates well. Pt does fatigue quickly nodding "yes" to being asked if he needed to lay back down. Pt returns to supine in bed and placed in L side lying for position change for skin integrity. Pt with all needs in reach upon exit.     If plan is discharge home, recommend the following: Two people to help with walking and/or transfers;Two people to help with bathing/dressing/bathroom;Assistance with feeding   Can travel by private vehicle     No  Equipment Recommendations  Other (comment)    Recommendations for Other Services       Precautions / Restrictions Precautions Precautions: Fall Restrictions Weight Bearing Restrictions Per Provider Order: No     Mobility  Bed Mobility Overal bed mobility: Needs Assistance Bed Mobility: Supine to Sit, Sit to Supine, Rolling Rolling: Total assist, +2 for physical assistance, Used rails   Supine to sit: Total assist, +2 for physical assistance, HOB elevated Sit to supine: Total assist, +2 for physical assistance      Patient Response: Cooperative, Flat affect  Transfers                        Ambulation/Gait                   Stairs             Wheelchair Mobility     Tilt Bed Tilt Bed Patient Response: Cooperative, Flat affect  Modified Rankin (Stroke Patients Only)       Balance Overall balance assessment: Needs assistance Sitting-balance support: Feet supported, Bilateral upper extremity supported Sitting balance-Leahy Scale: Good       Standing balance-Leahy Scale: Zero                              Communication Communication Communication: Impaired Factors Affecting Communication: Reduced clarity of speech;Difficulty expressing self  Cognition Arousal: Alert Behavior During Therapy: Flat affect   PT - Cognitive impairments: History of cognitive impairments, Difficult to assess Difficult to assess due to: Impaired communication                       Following commands: Intact Following commands impaired: Follows one step commands with increased time    Cueing Cueing Techniques: Verbal cues, Gestural cues, Tactile cues  Exercises Other Exercises Other Exercises: shoulder shrugs x10 with max TC's. x10 scap retractions with max TC's    General Comments        Pertinent Vitals/Pain Pain  Assessment Pain Assessment: No/denies pain    Home Living   Living Arrangements: Spouse/significant other                      Prior Function            PT Goals (current goals can now be found in the care plan section) Acute Rehab PT Goals Patient Stated Goal: get better, return to PLOF PT Goal Formulation: With family Time For Goal Achievement: 07/08/23 Potential to Achieve Goals: Fair Progress towards PT goals: Progressing toward goals    Frequency    Min 2X/week      PT Plan      Co-evaluation PT/OT/SLP Co-Evaluation/Treatment: Yes Reason for Co-Treatment: Complexity of the patient's impairments  (multi-system involvement);Necessary to address cognition/behavior during functional activity;For patient/therapist safety PT goals addressed during session: Balance;Mobility/safety with mobility OT goals addressed during session: ADL's and self-care      AM-PAC PT "6 Clicks" Mobility   Outcome Measure  Help needed turning from your back to your side while in a flat bed without using bedrails?: Total Help needed moving from lying on your back to sitting on the side of a flat bed without using bedrails?: Total Help needed moving to and from a bed to a chair (including a wheelchair)?: Total Help needed standing up from a chair using your arms (e.g., wheelchair or bedside chair)?: Total Help needed to walk in hospital room?: Total Help needed climbing 3-5 steps with a railing? : Total 6 Click Score: 6    End of Session Equipment Utilized During Treatment: Oxygen Activity Tolerance: Patient tolerated treatment well Patient left: in bed;with call bell/phone within reach;with bed alarm set;with family/visitor present (L side lying for skin integrity) Nurse Communication: Mobility status PT Visit Diagnosis: Other abnormalities of gait and mobility (R26.89);Muscle weakness (generalized) (M62.81)     Time: 1914-7829 PT Time Calculation (min) (ACUTE ONLY): 23 min  Charges:    $Therapeutic Activity: 8-22 mins PT General Charges $$ ACUTE PT VISIT: 1 Visit                     Marc Senior. Fairly IV, PT, DPT Physical Therapist- [place]  [facility]  07/05/2023, 2:44 PM

## 2023-07-05 NOTE — Plan of Care (Signed)
   Problem: Education: Goal: Ability to describe self-care measures that may prevent or decrease complications (Diabetes Survival Skills Education) will improve Outcome: Progressing Goal: Individualized Educational Video(s) Outcome: Progressing   Problem: Coping: Goal: Ability to adjust to condition or change in health will improve Outcome: Progressing

## 2023-07-05 NOTE — Progress Notes (Signed)
 Spoke with Pulmonary and they will reschedule bronchoscopy to a later date.  Will restart lovenox  injection chronic right jugular DVT for now and monitor how much he eats.  Will likely need Eliquis  Dr. Verla Glaze.

## 2023-07-05 NOTE — Progress Notes (Signed)
 Order received from Dr Renae Gloss to discontinue telemetry

## 2023-07-05 NOTE — Progress Notes (Addendum)
 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    4/18: Care transferred to [facility] 4/21: NGT placed on 4/20 for initiation of tube feeds.  Patient was tolerating however tube may have been dislodged overnight.  Coarse breath sounds and tube was found to be coiled up in the back of the throat.  First tube discontinued and new tube replaced with assistance of ICU charge RN. 4/22.  Tube feed resumed after NG tube confirmed in place. 4/23.  Patient lethargic.  MRI of the brain with and without contrast has motion degradation but did not see any acute stroke or mass. 4/24.  Patient seen with wife at the bedside.  Patient was able to open his eyes with sternal rub but had try to answer a few questions and shook his head and was able to squeeze my hand.  Wife wants me to limit medications that cause altered mental status and wanted me to decrease his dose of Celexa . 4/25.  Patient able to talk to me today.  Patient able to follow some simple commands.  Patient stated he feel terrible but could not elaborate. 4/26.  Patient pulled out the Dobbhoff tube last night.  NG tube replaced and in proper position.  Will restart tube feeds.  Patient's wife gave him a sip of water  and he did not choke.  Patients mental status has improved over the last couple days. 4/27.  Will hold Lovenox  injections for potential PEG tomorrow. 4/28.  Patient did better with swallow evaluation.  Patient will be put on pured diet with thin liquids.  Still  high risk for aspiration and high risk for not keeping up with his nutritional needs.  Patient's wife wants to hold off on PEG feeding tube.  Assessment and Plan: * Acute hypoxic respiratory failure (HCC) Patient required intubation during the hospital course and extubated on 4/17.  On 4/20 was weaned to 3 L nasal cannula.  Patient completed course of antibiotics.  Currently on room air.  Continue nebulizers and scopolamine  patch (for secretions).  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  Patient does have a port.  Appeared chronic as per radiologist.  Hold Lovenox  for now.  Pulmonary to talk with patient and wife about possible bronchoscopy.  Acute metabolic encephalopathy Mental status improved over the last few days.  Will place on a diet today pured diet with thin liquids and see how he does eating.  Patient's wife wanted to hold off on PEG.  NSTEMI (non-ST elevated myocardial infarction) (HCC) Likely from septic shock.  Completed 72 hours of heparin  drip.  Cardiology not planning on any intervention.  Echocardiogram showing a normal EF at 50%.  AKI (acute kidney injury) (HCC) Creatinine 1.95 on presentation.  Last creatinine 1.53.  I suspect patient may have underlying chronic kidney disease stage IIIa.   Small cell lung cancer [facility]) CT chest concerning for cancer recurrence.  MRI of the brain did not show any masses.  Pulmonary to speak with the patient's wife today about  whether or not to proceed with bronchoscopy.  Septic shock (HCC) Secondary to multifocal pneumonia.  This has been treated with antibiotics.  Patient did have a fever on 4/23 ago but procalcitonin was 0.25 so I will hold off on further antibiotics.  Repeat x-ray did show multifocal pneumonia but previous x-ray did show this also.  Uncontrolled type 2 diabetes mellitus with hyperglycemia, without long-term current use of insulin  (HCC) Last hemoglobin A1c 7.2.  Since patient will  be put on a diet I will change sliding scale to before meals and at bedtime.  I will also cut back on long-acting insulin  to avoid low sugar.        Subjective: Patient was getting a breathing treatment this morning.  Did not offer any complaints.  Admitted with respiratory failure and pneumonia.  Physical Exam: Vitals:   07/04/23 1459 07/04/23 2013 07/05/23 0215 07/05/23 0837  BP: 118/61 138/74 (!) 132/59 133/66  Pulse: 81 85 (!) 57 71  Resp: 18 20 20 18   Temp: 98.2 F (36.8 C) (!) 97.3 F (36.3 C) 97.7 F (36.5 C) 98.5 F (36.9 C)  TempSrc: Oral Oral Oral Oral  SpO2: 95% 95% 98% 92%  Weight:   93 kg   Height:       Physical Exam HENT:     Head: Normocephalic.  Eyes:     General: Lids are normal.     Conjunctiva/sclera: Conjunctivae normal.  Cardiovascular:     Rate and Rhythm: Normal rate and regular rhythm.     Heart sounds: Normal heart sounds, S1 normal and S2 normal.  Pulmonary:     Breath sounds: Transmitted upper airway sounds present. Examination of the right-lower field reveals decreased breath sounds. Examination of the left-lower field reveals decreased breath sounds. Decreased breath sounds present. No wheezing, rhonchi or rales.  Abdominal:     Palpations: Abdomen is soft.     Tenderness: There is no abdominal tenderness.  Musculoskeletal:     Right upper arm: Swelling present.     Right lower leg: No swelling.     Left lower leg: No swelling.  Skin:    General: Skin is warm.     Findings: No rash.  Neurological:     Mental Status: He is alert.     Comments: Patient able to answer few questions.      Data Reviewed: Sodium 133, creatinine 1.53  Family Communication: Spoke with wife on the phone  Disposition: Status is: Inpatient Remains inpatient appropriate because: Patient did better with speech pathology today and placed on a diet.  Patient's wife would like to see how he does eating and wants to hold off on the PEG feeding tube.  Planned  Discharge Destination: Home with Home Health    Time spent: 28 minutes Spoke with speech pathologist, gastroenterology and pulmonary Author: Verla Glaze, MD 07/05/2023 2:50 PM  For on call review [email or web address].

## 2023-07-05 NOTE — TOC Progression Note (Addendum)
 Transition of Care [facility]) - Progression Note    Patient Details  Name: Edwin Jones MRN: [id number] Date of Birth: May 26, 1940  [facility] CM/SW Contact  Odilia Bennett, LCSW Phone Number: 07/05/2023, 8:32 AM  Clinical Narrative:  No call back or reply emails from [facility]. Wife provided phone number for an OT, [name], at the [place] on Friday. CSW called and left a voicemail. Tried calling [facility] hospital liaison again but phone goes straight to voicemail.  2:45 pm: Updated wife at bedside.  3:17 pm: Tried calling the Child psychotherapist that works with patient's PCP at the [place]. Was on hold for 30 minutes before hanging up.  Expected Discharge Plan and Services                                               Social Determinants of Health (SDOH) Interventions SDOH Screenings   Food Insecurity: Patient Unable To Answer (06/19/2023)  Housing: Patient Unable To Answer (06/19/2023)  Transportation Needs: Patient Unable To Answer (06/19/2023)  Utilities: Patient Unable To Answer (06/19/2023)  Social Connections: Patient Unable To Answer (06/19/2023)  Tobacco Use: Medium Risk (06/18/2023)    Readmission Risk Interventions    06/29/2023    1:27 PM  Readmission Risk Prevention Plan  PCP or Specialist Appt within 3-5 Days Complete  HRI or Home Care Consult Complete  Social Work Consult for Recovery Care Planning/Counseling Complete  Palliative Care Screening Not Applicable

## 2023-07-05 NOTE — Progress Notes (Signed)
 Speech Language Pathology Treatment: Dysphagia  Patient Details Name: Edwin Jones MRN: [id number] DOB: 08/31/1940 Today's Date: 07/05/2023 Time: 4098-1191 SLP Time Calculation (min) (ACUTE ONLY): 13 min  Assessment / Plan / Recommendation Clinical Impression  MBSS completed this date. Follow up education completed with pt's wife. Based on today's MBSS, pt presents with a moderate-severe oral dysphagia, functional pharyngeal swallow. Suspect pt is nearing swallowing baseline. Oral phase c/b intermittent oral holding, moderate-severe oral stasis, and repetitive lingual movements. Oral deficits most pronounced with puree. Pt benefited from liquid wash to reduced oral stasis and verbal cues to swallow. Pharyngeal phase, although functional, was c/b swallow initiation at the pyrifrom sinuses with intermittent pre-swallow pooling prior to swallow. No instances of penetration and aspiration were observed. SLP review images from previous MBSS, and pt appears to have marked improvement in oropharyngeal swallow function. Pt's swallow continues to be inefficient, however. Concern for pt's ability to maintain nutritional status. Recommend initiation of a puree diet, thin liquids with safe swallowing strategies/aspiration precautions, and RD f/u. Discussed results of MBSS, progress to date, diet recommendations, safe swallowing strategies/aspiration precautions, and SLP POC with wife. Pt's wife expressed gratitude for results/education. Wife also asked pertinent questions and verbalized satisfaction with answers provided by SLP.    HPI HPI: Per critical care progress note, "Mr. [name] is an 83 year old African-American male with a history of type 2 diabetes, hypertension, small cell lung cancer, and COPD who presented to the ED via EMS with complaints of respiratory distress. History is obtained from EMS records as well as ED records as patient is currently intubated and sedated and there is no family at the bedside.  Per ED records, EMS was called for respiratory distress. Upon EMS arrival, patient's oxygen saturation was 60%. Bag mask ventilation was started and patient was transported to the ED after an unsuccessful intubation attempt in the field. Upon ED arrival, patient had copious amounts of secretions in his oral cavity. Initial ED workup revealed multifocal pneumonia with sepsis. Patient was placed on BiPAP however he became combative and started coughing profusely and also had large amounts of secretions that filled up the BiPAP mask. His oxygen saturation dropped and he was emergently intubated. PCCM was consulted for further management." Pt intubated 4/11-4/16. Per RN, spouse reported pt was ind with eating/drinking at baseline. Head CT 4/17: No CT evidence of acute intracranial abnormality.      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  Moderate chronic microvascular ischemic changes and parenchymal  volume loss.     Large right mastoid effusion. Recommend correlation for evidence of  mastoiditis. CXR 4/15: Patchy airspace disease in both lung bases, right greater than  left. Imaging features could be related to atelectasis or  infiltrate.      SLP Plan  Discharge SLP treatment due to (comment) (pt nearing baseline for dysphagia)      Recommendations for follow up therapy are one component of a multi-disciplinary discharge planning process, led by the attending physician.  Recommendations may be updated based on patient status, additional functional criteria and insurance authorization.    Recommendations  Diet recommendations: Dysphagia 1 (puree);Thin liquid Liquids provided via: Teaspoon;Cup Medication Administration: Crushed with puree Supervision: Staff to assist with self feeding;Full supervision/cueing for compensatory strategies;Trained caregiver to feed patient Compensations: Minimize environmental distractions;Slow rate;Small sips/bites;Follow solids with liquid Postural  Changes and/or Swallow Maneuvers: Seated upright 90 degrees;Upright 30-60 min after meal  Oral care before and after PO;Oral care QID;Staff/trained caregiver to provide oral care   Frequent or constant Supervision/Assistance Dysphagia, oral phase (R13.11)     Discharge SLP treatment due to (comment) (pt nearing baseline for dysphagia)   Dia Forget, M.S., CCC-SLP Speech-Language Pathologist [facility] [phone] (ASCOM)    Adin Honour  07/05/2023, 2:35 PM

## 2023-07-05 NOTE — Progress Notes (Signed)
 Occupational Therapy Treatment Patient Details Name: Edwin Jones MRN: [id number] DOB: [birth year] Today's Date: 07/05/2023   History of present illness Pt is an 83 y/o M admitted on 06/18/23 after presenting to the ED with c/o respiratory distress. Pt required intubation, found to have multifocal PNA with sepsis. Pt also being treated for NSTEMI, septic shock. Pt extubated 06/23/23. PMH: DM2, HTN, small cell lung CA, COPD, Hep C, hypercholesterolemia, CVA with residual R sided deficits   OT comments  Upon entering the room, pt supine in bed with wife present in room. Pt seen for skilled co-treatment with PT to address functional mobility. 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 Pt is agreeable to OT intervention. Pt needing total A of 2 for bed mobility to EOB. Pt able to sit EPB for ~ 10 minutes with close supervision for static sitting balance. Pt washes hands while seated on EOB with set up A to obtain needed items. Pt attempts some shoulder exercises but having some difficulty understanding directions and demonstrations. Pt returns to supine and placed into supported side lying position to the left. Call bell and all needed items within reach upon exiting the room.       If plan is discharge home, recommend the following:  Two people to help with walking and/or transfers;Two people to help with bathing/dressing/bathroom   Equipment Recommendations  None recommended by OT       Precautions / Restrictions Precautions Precautions: Fall       Mobility Bed Mobility Overal bed mobility: Needs Assistance Bed Mobility: Supine to Sit, Sit to Supine, Rolling Rolling: Total assist, +2 for physical assistance, Used rails   Supine to sit: Total assist, +2 for physical assistance, HOB elevated Sit to supine: Total assist, +2 for physical assistance                 Balance Overall balance assessment: Needs assistance Sitting-balance support:  Feet supported, Bilateral upper extremity supported Sitting balance-Leahy Scale: Good                                     ADL either performed or assessed with clinical judgement   ADL Overall ADL's : Needs assistance/impaired     Grooming: Wash/dry hands;Sitting;Set up;Supervision/safety                                      Extremity/Trunk Assessment Upper Extremity Assessment Upper Extremity Assessment: Generalized weakness   Lower Extremity Assessment Lower Extremity Assessment: Generalized weakness        Vision Patient Visual Report: No change from baseline           Communication Communication Communication: Impaired Factors Affecting Communication: Reduced clarity of speech;Difficulty expressing self   Cognition Arousal: Alert Behavior During Therapy: Flat affect                                 Following commands: Intact Following commands impaired: Follows one step commands with increased time      Cueing   Cueing Techniques: Verbal cues, Gestural cues, Tactile cues             Pertinent Vitals/ Pain       Pain Assessment Pain Assessment: Faces Faces Pain Scale:  No hurt  Home Living   Living Arrangements: Spouse/significant other                                          Frequency  Min 2X/week        Progress Toward Goals  OT Goals(current goals can now be found in the care plan section)  Progress towards OT goals: Progressing toward goals         Co-evaluation    PT/OT/SLP Co-Evaluation/Treatment: Yes Reason for Co-Treatment: Complexity of the patient's impairments (multi-system involvement);Necessary to address cognition/behavior during functional activity;For patient/therapist safety PT goals addressed during session: Balance;Mobility/safety with mobility OT goals addressed during session: ADL's and self-care      AM-PAC OT "6 Clicks" Daily Activity     Outcome  Measure   Help from another person eating meals?: Total Help from another person taking care of personal grooming?: Total Help from another person toileting, which includes using toliet, bedpan, or urinal?: Total Help from another person bathing (including washing, rinsing, drying)?: Total Help from another person to put on and taking off regular upper body clothing?: Total Help from another person to put on and taking off regular lower body clothing?: Total 6 Click Score: 6    End of Session    OT Visit Diagnosis: Other abnormalities of gait and mobility (R26.89);Muscle weakness (generalized) (M62.81);Hemiplegia and hemiparesis   Activity Tolerance Patient tolerated treatment well   Patient Left with family/visitor present;in bed;with call bell/phone within reach;with bed alarm set   Nurse Communication Mobility status        Time: 4098-1191 OT Time Calculation (min): 23 min  Charges: OT General Charges $OT Visit: 1 Visit OT Treatments $Therapeutic Activity: 8-22 mins  George Kinder, MS, OTR/L , CBIS ascom [phone]  07/05/23, 1:13 PM

## 2023-07-05 NOTE — Procedures (Addendum)
 Modified Barium Swallow Study  Patient Details  Name: Edwin Jones MRN: [id number] Date of Birth: [birth year]  Today's Date: 07/05/2023  Modified Barium Swallow completed.  Full report located under Chart Review in the Imaging Section.  History of Present Illness Per critical care progress note, "Mr. Overgaard is an 83 year old African-American male with a history of type 2 diabetes, hypertension, small cell lung cancer, and COPD who presented to the ED via EMS with complaints of respiratory distress. History is obtained from EMS records as well as ED records as patient is currently intubated and sedated and there is no family at the bedside. Per ED records, EMS was called for respiratory distress. Upon EMS arrival, patient's oxygen saturation was 60%. Bag mask ventilation was started and patient was transported to the ED after an unsuccessful intubation attempt in the field. Upon ED arrival, patient had copious amounts of secretions in his oral cavity. Initial ED workup revealed multifocal pneumonia with sepsis. Patient was placed on BiPAP however he became combative and started coughing profusely and also had large amounts of secretions that filled up the BiPAP mask. His oxygen saturation dropped and he was emergently intubated. PCCM was consulted for further management." Pt intubated 4/11-4/16. Per RN, spouse reported pt was ind with eating/drinking at baseline. Head CT 4/17: No CT evidence of acute intracranial abnormality.      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  Moderate chronic microvascular ischemic changes and parenchymal  volume loss.     Large right mastoid effusion. Recommend correlation for evidence of  mastoiditis. CXR 4/15: Patchy airspace disease in both lung bases, right greater than  left. Imaging features could be related to atelectasis or  infiltrate.   Clinical Impression Pt presents with a moderate-severe oral dysphagia, functional pharyngeal swallow. Suspect  pt is nearing swallowing baseline. Oral phase c/b intermittent oral holding, moderate-severe oral stasis, and repetitive lingual movements. Oral deficits most pronounced with puree. Pt benefited from liquid wash to reduced oral stasis and verbal cues to swallow. Pharyngeal phase, although functional, was c/b swallow initiation at the pyrifrom sinuses with intermittent pre-swallow pooling prior to swallow. No instances of penetration and aspiration were observed. SLP review images from previous MBSS, and pt appears to have marked improvement in oropharyngeal swallow function. Pt's swallow continues to be inefficient, however. Concern for pt's ability to maintain nutritional status. Recommend initiation of a puree diet, thin liquids with safe swallowing strategies/aspiration precautions, and RD f/u.  Factors that may increase risk of adverse event in presence of aspiration [name] & [name] 2021): Poor general health and/or compromised immunity;Respiratory or GI disease;Reduced cognitive function;Limited mobility;Frail or deconditioned;Dependence for feeding and/or oral hygiene;Inadequate oral hygiene;Weak cough;Presence of tubes (ETT, trach, NG, etc.)  Swallow Evaluation Recommendations Recommendations: PO diet PO Diet Recommendation: Dysphagia 1 (Pureed);Thin liquids (Level 0) Liquid Administration via: Spoon;Cup Medication Administration: Crushed with puree Supervision: Staff to assist with self-feeding;Full assist for feeding;Full supervision/cueing for swallowing strategies Swallowing strategies  : Minimize environmental distractions;Slow rate;Small bites/sips;Follow solids with liquids Postural changes: Position pt fully upright for meals;Stay upright 30-60 min after meals;Out of bed for meals Oral care recommendations: Oral care QID (4x/day);Oral care before PO;Staff/trained caregiver to provide oral care Recommended consults:  (RD and Palliative Care f/u) Caregiver Recommendations: Have oral  suction available    Dia Forget, M.S., CCC-SLP Speech-Language Pathologist [facility] [phone] (ASCOM)   Leory Rands Eran Windish 07/05/2023,2:33 PM

## 2023-07-06 ENCOUNTER — Inpatient Hospital Stay: Admitting: Anesthesiology

## 2023-07-06 ENCOUNTER — Inpatient Hospital Stay: Admitting: Urgent Care

## 2023-07-06 ENCOUNTER — Ambulatory Visit: Admission: RE | Admit: 2023-07-06 | Source: Home / Self Care | Admitting: Pulmonary Disease

## 2023-07-06 ENCOUNTER — Encounter: Admission: RE | Payer: Self-pay | Source: Home / Self Care

## 2023-07-06 DIAGNOSIS — I82621 Acute embolism and thrombosis of deep veins of right upper extremity: Secondary | ICD-10-CM | POA: Diagnosis not present

## 2023-07-06 DIAGNOSIS — G9341 Metabolic encephalopathy: Secondary | ICD-10-CM | POA: Diagnosis not present

## 2023-07-06 DIAGNOSIS — J9601 Acute respiratory failure with hypoxia: Secondary | ICD-10-CM | POA: Diagnosis not present

## 2023-07-06 DIAGNOSIS — I214 Non-ST elevation (NSTEMI) myocardial infarction: Secondary | ICD-10-CM | POA: Diagnosis not present

## 2023-07-06 DIAGNOSIS — R0602 Shortness of breath: Secondary | ICD-10-CM | POA: Insufficient documentation

## 2023-07-06 LAB — BASIC METABOLIC PANEL WITH GFR
Anion gap: 10 (ref 5–15)
BUN: 51 mg/dL — ABNORMAL HIGH (ref 8–23)
CO2: 25 mmol/L (ref 22–32)
Calcium: 8.9 mg/dL (ref 8.9–10.3)
Chloride: 98 mmol/L (ref 98–111)
Creatinine, Ser: 1.66 mg/dL — ABNORMAL HIGH (ref 0.61–1.24)
GFR, Estimated: 41 mL/min — ABNORMAL LOW (ref >60)
Glucose, Bld: 191 mg/dL — ABNORMAL HIGH (ref 70–99)
Potassium: 4.7 mmol/L (ref 3.5–5.1)
Sodium: 133 mmol/L — ABNORMAL LOW (ref 135–145)

## 2023-07-06 LAB — GLUCOSE, CAPILLARY
Glucose-Capillary: 188 mg/dL — ABNORMAL HIGH (ref 70–99)
Glucose-Capillary: 228 mg/dL — ABNORMAL HIGH (ref 70–99)

## 2023-07-06 LAB — CBC
HCT: 26.9 % — ABNORMAL LOW (ref 39.0–52.0)
Hemoglobin: 9.1 g/dL — ABNORMAL LOW (ref 13.0–17.0)
MCH: 31 pg (ref 26.0–34.0)
MCHC: 33.8 g/dL (ref 30.0–36.0)
MCV: 91.5 fL (ref 80.0–100.0)
Platelets: 459 10*3/uL — ABNORMAL HIGH (ref 150–400)
RBC: 2.94 MIL/uL — ABNORMAL LOW (ref 4.22–5.81)
RDW: 14.6 % (ref 11.5–15.5)
WBC: 9.5 10*3/uL (ref 4.0–10.5)
nRBC: 0 % (ref 0.0–0.2)

## 2023-07-06 LAB — PHOSPHORUS: Phosphorus: 4.3 mg/dL (ref 2.5–4.6)

## 2023-07-06 LAB — MAGNESIUM: Magnesium: 2 mg/dL (ref 1.7–2.4)

## 2023-07-06 SURGERY — ENDOBRONCHIAL ULTRASOUND (EBUS)
Anesthesia: General | Laterality: Bilateral

## 2023-07-06 MED ORDER — ENOXAPARIN SODIUM 100 MG/ML IJ SOSY
1.0000 mg/kg | PREFILLED_SYRINGE | Freq: Two times a day (BID) | INTRAMUSCULAR | Status: DC
  Filled 2023-07-06: qty 1

## 2023-07-06 MED ORDER — METOPROLOL SUCCINATE ER 25 MG PO TB24: 12.5000 mg | ORAL_TABLET | Freq: Every day | ORAL | Status: DC

## 2023-07-06 MED ORDER — APIXABAN 5 MG PO TABS: 10.0000 mg | ORAL_TABLET | Freq: Two times a day (BID) | ORAL | Status: DC

## 2023-07-06 MED ORDER — APIXABAN 5 MG PO TABS
10.0000 mg | ORAL_TABLET | Freq: Two times a day (BID) | ORAL | Status: DC
  Administered 2023-07-06 – 2023-07-08 (×4): 10 mg via ORAL
  Filled 2023-07-06 (×4): qty 2

## 2023-07-06 MED ORDER — METOPROLOL SUCCINATE ER 25 MG PO TB24
12.5000 mg | ORAL_TABLET | Freq: Every day | ORAL | Status: DC
  Administered 2023-07-07: 12.5 mg via ORAL
  Filled 2023-07-06 (×2): qty 1

## 2023-07-06 MED ORDER — APIXABAN 5 MG PO TABS
5.0000 mg | ORAL_TABLET | Freq: Two times a day (BID) | ORAL | Status: DC
Start: 2023-07-13 — End: 2023-07-08

## 2023-07-06 MED ORDER — METOPROLOL TARTRATE 25 MG PO TABS: 12.5000 mg | ORAL_TABLET | Freq: Two times a day (BID) | ORAL | Status: DC

## 2023-07-06 MED ORDER — INSULIN GLARGINE-YFGN 100 UNIT/ML ~~LOC~~ SOLN
5.0000 [IU] | Freq: Every day | SUBCUTANEOUS | Status: DC
  Administered 2023-07-06 – 2023-07-07 (×2): 5 [IU] via SUBCUTANEOUS
  Filled 2023-07-06 (×3): qty 0.05

## 2023-07-06 NOTE — Progress Notes (Signed)
 The patient was seen this morning lying in bed comfortably in no apparent distress.  The patient was evaluated yesterday again by speech pathology who deemed safe for the patient to start feedings and I was informed that the family is holding off on any feeding tube placement at this time.  If the patient should need a feeding tube if he is not able to keep up with his pyloric intake and his wife agrees to the procedure please do not hesitate to call me otherwise I will stop seeing the patient's daily at this point.

## 2023-07-06 NOTE — Consult Note (Addendum)
 PHARMACY - ANTICOAGULATION CONSULT NOTE  Pharmacy Consult for Apixaban Indication: DVT  Allergies  Allergen Reactions   Penicillin G Anaphylaxis    Other reaction(s): Other (See Comments) Couldn't breath among other things Has patient had a PCN reaction causing immediate rash, facial/tongue/throat swelling, SOB or lightheadedness with hypotension: Yes Has patient had a PCN reaction causing severe rash involving mucus membranes or skin necrosis: No Has patient had a PCN reaction that required hospitalization: Yes Has patient had a PCN reaction occurring within the last 10 years: No If all of the above answers are "NO", then may proceed with Ce   Shellfish-Derived Products Anaphylaxis   Statins     Other reaction(s): Other (See Comments) Muscle spasms - can't walk - couldn't turn over in bed   Erythromycin Itching    All mycins   Tamsulosin     Other reaction(s): Dizziness   Tuberculin Ppd     Other reaction(s): Other (See Comments) False test    Iodinated Contrast Media Hives   Patient Measurements: Height: 6\' 3"  (190.5 cm) Weight: 91 kg (200 lb 9.9 oz) IBW/kg (Calculated) : 84.5 HEPARIN  DW (KG): 95.3  Vital Signs: Temp: 99.2 F (37.3 C) (04/29 1626) Temp Source: Oral (04/29 1626) BP: 110/55 (04/29 1626) Pulse Rate: 80 (04/29 1626)  Labs: Recent Labs    07/04/23 0410 07/06/23 0530  HGB  --  9.1*  HCT  --  26.9*  PLT  --  459*  CREATININE 1.53* 1.66*   Estimated Creatinine Clearance: 40.3 mL/min (A) (by C-G formula based on SCr of 1.66 mg/dL (H)).  Medical History: Past Medical History:  Diagnosis Date   Cancer (HCC)    COPD (chronic obstructive pulmonary disease) (HCC)    Diabetes mellitus without complication (HCC)    GERD without esophagitis    Hepatitis C virus    history of hep C treated with Harvoni   Hypercholesterolemia    Hypertension    Seasonal allergies    Assessment: Edwin Jones is a 75 yl patients who presented to [facility] with acute hypoxic  respiratory failure secondary to recurrent of the limited small cell lung cancer. They were intubated during the course of this hospitalization.They were found to have a upper extremity DVT in the right internal jugular. Initially, they were treated with Lovenox  1 mg/kg. Pharmacy has been consulted to transition them to apixaban.   Baseline Labs Hgb 10.1 Plt 459 INR/PT 1.2/ 15.3  Plan:  -- Lovenox  1 mg/kg was discontinued  -- To start apixaban 10 mg BID x 7 days then transition to 5 mg around when the next dose of Lovenox  was due -- Continue to monitor platelets   Thank you for allowing pharmacy to participate in this patient's care.   Craven Do, PharmD Pharmacy Resident  07/06/2023 5:19 PM

## 2023-07-06 NOTE — Plan of Care (Signed)
   Problem: Coping: Goal: Level of anxiety will decrease Outcome: Progressing   Problem: Pain Managment: Goal: General experience of comfort will improve and/or be controlled Outcome: Progressing   Problem: Safety: Goal: Ability to remain free from injury will improve Outcome: Progressing

## 2023-07-06 NOTE — TOC Progression Note (Addendum)
 Transition of Care [facility]) - Progression Note    Patient Details  Name: Edwin Jones MRN: [id number] Date of Birth: [birth year]  [facility] CM/SW Contact  Odilia Bennett, LCSW Phone Number: 07/06/2023, 9:33 AM  Clinical Narrative:   Still unable to reach [facility]. CSW called [facility] and spoke to Designer, television/film set. She transferred CSW to the Child psychotherapist. CSW left her a vm.  10:54 am: CSW called wife and provided update. She stated they have a tempur-pedic type bed so she doesn't necessarily need a hospital bed. She is mostly wanting the suction machine in case he aspirates. She will also continue trying to reach the [place].  Expected Discharge Plan and Services                                               Social Determinants of Health (SDOH) Interventions SDOH Screenings   Food Insecurity: Patient Unable To Answer (06/19/2023)  Housing: Patient Unable To Answer (06/19/2023)  Transportation Needs: Patient Unable To Answer (06/19/2023)  Utilities: Patient Unable To Answer (06/19/2023)  Social Connections: Patient Unable To Answer (06/19/2023)  Tobacco Use: Medium Risk (06/18/2023)    Readmission Risk Interventions    06/29/2023    1:27 PM  Readmission Risk Prevention Plan  PCP or Specialist Appt within 3-5 Days Complete  HRI or Home Care Consult Complete  Social Work Consult for Recovery Care Planning/Counseling Complete  Palliative Care Screening Not Applicable

## 2023-07-06 NOTE — Progress Notes (Signed)
 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    4/18: Care transferred to [facility] 4/21: NGT placed on 4/20 for initiation of tube feeds.  Patient was tolerating however tube may have been dislodged overnight.  Coarse breath sounds and tube was found to be coiled up in the back of the throat.  First tube discontinued and new tube replaced with assistance of ICU charge RN. 4/22.  Tube feed resumed after NG tube confirmed in place. 4/23.  Patient lethargic.  MRI of the brain with and without contrast has motion degradation but did not see any acute stroke or mass. 4/24.  Patient seen with wife at the bedside.  Patient was able to open his eyes with sternal rub but had try to answer a few questions and shook his head and was able to squeeze my hand.  Wife wants me to limit medications that cause altered mental status and wanted me to decrease his dose of Celexa . 4/25.  Patient able to talk to me today.  Patient able to follow some simple commands.  Patient stated he feel terrible but could not elaborate. 4/26.  Patient pulled out the Dobbhoff tube last night.  NG tube replaced and in proper position.  Will restart tube feeds.  Patient's wife gave him a sip of water  and he did not choke.  Patients mental status has improved over the last couple days. 4/27.  Will hold Lovenox  injections for potential PEG tomorrow. 4/28.  Patient did better with swallow evaluation.  Patient will be put on pured diet with thin liquids.  Still  high risk for aspiration and high risk for not keeping up with his nutritional needs.  Patient's wife wants to hold off on PEG feeding tube. 4/29.  Wife said he ate a good lunch and she fed him.  She definitely wants to hold off on the feeding tube.  Will change Lovenox  over to Eliquis.  Assessment and Plan: * Acute hypoxic respiratory failure (HCC) Patient required intubation during the hospital course and extubated on 4/17.  On 4/20 was weaned to 3 L nasal cannula.  Patient completed course of antibiotics.  Currently on room air.  Continue nebulizers and scopolamine  patch (for secretions).  Patient's wife interested in suction machine for home.  TOC looking into this.  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  Patient does have a port.  Appeared chronic as per radiologist.  Will switch Lovenox  over to Eliquis for this evening.  Acute metabolic encephalopathy Mental status improved from last week.  Patient's wife has wanted to hold off on PEG feeding tube.  Patient on pured diet with thin liquids.  NSTEMI (non-ST elevated myocardial infarction) (HCC) Likely from septic shock.  Completed 72 hours of heparin  drip.  Cardiology not planning on any intervention.  Echocardiogram showing a normal EF at 50%.  Low-dose Toprol .  Since patient will be going home on Eliquis I will get rid of aspirin .  AKI (acute kidney injury) (HCC) Creatinine 1.95 on presentation.  Best creatinine down at 1.37.  Last creatinine 1.66.  I suspect patient may have underlying chronic kidney disease stage IIIa  Small cell lung cancer (HCC) CT chest concerning for cancer recurrence.  MRI of the brain did not show any masses.  Dr. Hobart Lulas pulmonary to follow-up with patient about timing of bronchoscopy.  Septic shock (HCC) Secondary to multifocal pneumonia.  This has been treated with antibiotics.  Patient did have a fever on 4/23 ago but procalcitonin was 0.25 so I will hold off on  further antibiotics.  Repeat x-ray did show multifocal pneumonia but previous x-ray did show this also.  Uncontrolled type 2 diabetes mellitus with hyperglycemia, without long-term current use of insulin  (HCC) Last hemoglobin A1c 7.2.  I held long-acting insulin  yesterday since the patient was just placed on diet and I was not sure how much he would eat.  Will add Semglee  insulin  5 units at bedtime since/sugar over 200.  Continue sliding scale insulin .        Subjective: Patient opens eyes and able to move his arms and legs a little bit.  Admitted 17 days ago with acute respiratory failure and was on the ventilator  Physical Exam: Vitals:   07/06/23 0200 07/06/23 0500 07/06/23 0733 07/06/23 1626  BP: 104/61  (!) 135/55 (!) 110/55  Pulse: 81  78 80  Resp: 16  16 18   Temp: 98.3 F (36.8 C)  98 F (36.7 C) 99.2 F (37.3 C)  TempSrc:   Oral Oral  SpO2: 97%  97% 95%  Weight:  91 kg    Height:       Physical Exam HENT:     Head: Normocephalic.  Eyes:     General: Lids are normal.     Conjunctiva/sclera: Conjunctivae normal.  Cardiovascular:     Rate and Rhythm: Normal rate and regular rhythm.     Heart sounds: Normal heart sounds, S1 normal and S2 normal.  Pulmonary:     Breath sounds: No transmitted upper airway sounds. Examination of the right-lower field reveals decreased breath sounds. Examination of the left-lower field reveals decreased breath sounds. Decreased breath sounds present. No wheezing, rhonchi or rales.  Abdominal:     Palpations: Abdomen is soft.     Tenderness: There is no abdominal tenderness.  Musculoskeletal:     Right upper arm: Swelling present.     Right lower leg: No swelling.     Left lower leg: No swelling.  Skin:    General: Skin is warm.     Findings: No rash.  Neurological:     Mental Status: He is alert.     Comments: Patient able to answer few questions.      Data Reviewed: Creatinine 1.66, sodium 133, hemoglobin 9.1, platelet count  459  Family Communication: Wife on the phone  Disposition: Status is: Inpatient Remains inpatient appropriate because: TOC trying to set up suction machine for home.  Planned Discharge Destination: Home with Home Health    Time spent: 28 minutes  Author: Verla Glaze, MD 07/06/2023 5:03 PM  For on call review [email or web address].

## 2023-07-07 ENCOUNTER — Other Ambulatory Visit (HOSPITAL_COMMUNITY): Payer: Self-pay

## 2023-07-07 LAB — GLUCOSE, CAPILLARY
Glucose-Capillary: 198 mg/dL — ABNORMAL HIGH (ref 70–99)
Glucose-Capillary: 270 mg/dL — ABNORMAL HIGH (ref 70–99)
Glucose-Capillary: 220 mg/dL — ABNORMAL HIGH (ref 70–99)

## 2023-07-07 LAB — PHOSPHORUS: Phosphorus: 3.2 mg/dL (ref 2.5–4.6)

## 2023-07-07 LAB — BASIC METABOLIC PANEL WITH GFR
Anion gap: 12 (ref 5–15)
BUN: 48 mg/dL — ABNORMAL HIGH (ref 8–23)
CO2: 22 mmol/L (ref 22–32)
Calcium: 8.6 mg/dL — ABNORMAL LOW (ref 8.9–10.3)
Chloride: 99 mmol/L (ref 98–111)
Creatinine, Ser: 1.63 mg/dL — ABNORMAL HIGH (ref 0.61–1.24)
GFR, Estimated: 42 mL/min — ABNORMAL LOW (ref >60)
Glucose, Bld: 184 mg/dL — ABNORMAL HIGH (ref 70–99)
Potassium: 4.5 mmol/L (ref 3.5–5.1)
Sodium: 133 mmol/L — ABNORMAL LOW (ref 135–145)

## 2023-07-07 LAB — HEMOGLOBIN: Hemoglobin: 9.2 g/dL — ABNORMAL LOW (ref 13.0–17.0)

## 2023-07-07 LAB — MAGNESIUM: Magnesium: 2 mg/dL (ref 1.7–2.4)

## 2023-07-07 NOTE — Progress Notes (Signed)
 Progress Note   Patient: Edwin Jones ZOX:[id number] DOB: 06/06/1940 DOA: 06/18/2023     19 DOS: the patient was seen and examined on 07/07/2023   Brief hospital course: 83 year old male patient with a past medical history of limited small cell lung cancer diagnosed in 2018 status post chemoradiation in remission with recent suspicion for recurrence was 4 or FDG avid on PET scan done in late 2024 presenting to [facility] on 0/11 with acute hypoxic respiratory failure secondary to community-acquired pneumonia complicated by septic shock.    4/18: Care transferred to [facility] 4/21: NGT placed on 4/20 for initiation of tube feeds.  Patient was tolerating however tube may have been dislodged overnight.  Coarse breath sounds and tube was found to be coiled up in the back of the throat.  First tube discontinued and new tube replaced with assistance of ICU charge RN. 4/22.  Tube feed resumed after NG tube confirmed in place. 4/23.  Patient lethargic.  MRI of the brain with and without contrast has motion degradation but did not see any acute stroke or mass. 4/24.  Patient seen with wife at the bedside.  Patient was able to open his eyes with sternal rub but had try to answer a few questions and shook his head and was able to squeeze my hand.  Wife wants me to limit medications that cause altered mental status and wanted me to decrease his dose of Celexa . 4/25.  Patient able to talk to me today.  Patient able to follow some simple commands.  Patient stated he feel terrible but could not elaborate. 4/26.  Patient pulled out the Dobbhoff tube last night.  NG tube replaced and in proper position.  Will restart tube feeds.  Patient's wife gave him a sip of water  and he did not choke.  Patients mental status has improved over the last couple days. 4/27.  Will hold Lovenox  injections for potential PEG tomorrow. 4/28.  Patient did better with swallow evaluation.  Patient will be put on pured diet with thin liquids.  Still  high risk for aspiration and high risk for not keeping up with his nutritional needs.  Patient's wife wants to hold off on PEG feeding tube. 4/29.  Wife said he ate a good lunch and she fed him.  She definitely wants to hold off on the feeding tube.  Will change Lovenox  over to Eliquis.  Assessment and Plan: * Acute hypoxic respiratory failure (HCC) Patient required intubation during the hospital course and extubated on 4/17.  On 4/20 was weaned to 3 L nasal cannula.  Patient completed course of antibiotics.  Currently on room air.  Continue nebulizers and scopolamine  patch (for secretions).  Patient's wife interested in suction machine for home.  TOC looking into this.  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  Patient does have a port.  Appeared chronic as per radiologist.  Will switch Lovenox  over to Eliquis for this evening.  Acute metabolic encephalopathy Mental status improved from last week.  Patient's wife has wanted to hold off on PEG feeding tube.  Patient on pured diet with thin liquids.  NSTEMI (non-ST elevated myocardial infarction) (HCC) Likely from septic shock.  Completed 72 hours of heparin  drip.  Cardiology not planning on any intervention.  Echocardiogram showing a normal EF at 50%.  Low-dose Toprol .  Since patient will be going home on Eliquis I will get rid of aspirin .  AKI (acute kidney injury) (HCC) Creatinine 1.95 on presentation.  Best creatinine down at 1.37.  Last creatinine 1.66.  I suspect patient may have underlying chronic kidney disease stage IIIa  Small cell lung cancer (HCC) CT chest concerning for cancer recurrence.  MRI of the brain did not show any masses.  Dr. Hobart Lulas pulmonary to follow-up with patient about timing of bronchoscopy.  Septic shock (HCC) Secondary to multifocal pneumonia.  This has been treated with antibiotics.  Patient did have a fever on 4/23 ago but procalcitonin was 0.25 so I will hold off on  further antibiotics.  Repeat x-ray did show multifocal pneumonia but previous x-ray did show this also.  Uncontrolled type 2 diabetes mellitus with hyperglycemia, without long-term current use of insulin  (HCC) Last hemoglobin A1c 7.2.  I held long-acting insulin  yesterday since the patient was just placed on diet and I was not sure how much he would eat.  Will add Semglee  insulin  5 units at bedtime since/sugar over 200.  Continue sliding scale insulin .        Subjective: Patient opens eyes and able to move his arms and legs a little bit.  Admitted 17 days ago with acute respiratory failure and was on the ventilator  Physical Exam: Vitals:   07/07/23 0439 07/07/23 0631 07/07/23 0733 07/07/23 1335  BP: 129/67     Pulse: 70     Resp: 18     Temp: 99.2 F (37.3 C)     TempSrc: Oral     SpO2: 95%  92% 95%  Weight:  92 kg    Height:       Physical Exam HENT:     Head: Normocephalic.  Eyes:     General: Lids are normal.     Conjunctiva/sclera: Conjunctivae normal.  Cardiovascular:     Rate and Rhythm: Normal rate and regular rhythm.     Heart sounds: Normal heart sounds, S1 normal and S2 normal.  Pulmonary:     Breath sounds: No transmitted upper airway sounds. Examination of the right-lower field reveals decreased breath sounds. Examination of the left-lower field reveals decreased breath sounds. Decreased breath sounds present. No wheezing, rhonchi or rales.  Abdominal:     Palpations: Abdomen is soft.     Tenderness: There is no abdominal tenderness.  Musculoskeletal:     Right upper arm: Swelling present.     Right lower leg: No swelling.     Left lower leg: No swelling.  Skin:    General: Skin is warm.     Findings: No rash.  Neurological:     Mental Status: He is alert.     Comments: Patient able to answer few questions.      Data Reviewed: Notable labs --  Na 133 Glucose 184 BUN 48 Cr 1.63 stable Ca 8.6 Hbg 9.2 stable  Family Communication: None at bedside  on rounds.  Will attempt to call wife as time allows. Currently no new medical updates to provide. Patient is stable for discharge once DME is obtained.   Disposition: Status is: Inpatient  Remains inpatient appropriate because: Awaiting necessary DMR for home including suction machine.  Plan is for d/c 5/1  Planned Discharge Destination: Home with Home Health. (Wife declined SNF placement.)      Time spent: 36 minutes including time at bedside and in coordination of care with staff, TOC. \  Author: Montey Apa, DO 07/07/2023 4:06 PM  For on call review [email or web address].

## 2023-07-07 NOTE — Discharge Summary (Signed)
 Physician Discharge Summary   Patient: Edwin Jones MRN: [id number] DOB: March 26, 1940  Admit date:     06/18/2023  Discharge date: 07/08/2023  Discharge Physician: Montey Apa   PCP: Macie Saxon, MD   Recommendations at discharge:    Follow up with Primary Care in 1-2 weeks Repeat CBC, CMP, Mg, Phos at follow up Follow up with Oncology Follow up on glycemic control and adjust regimen as needed; monitoring closely for hypoglycemia given times of poor PO intake and on sulfonylurea   Discharge Diagnoses: Principal Problem:   Acute hypoxic respiratory failure (HCC) Active Problems:   DVT of upper extremity (deep vein thrombosis) (HCC)   Acute metabolic encephalopathy   NSTEMI (non-ST elevated myocardial infarction) (HCC)   AKI (acute kidney injury) (HCC)   Small cell lung cancer (HCC)   Septic shock (HCC)   Multifocal pneumonia   Uncontrolled type 2 diabetes mellitus with hyperglycemia, without long-term current use of insulin  (HCC)  Resolved Problems:   * No resolved hospital problems. [facility] Course:  "83 year old male patient with a past medical history of limited small cell lung cancer diagnosed in 2018 status post chemoradiation in remission with recent suspicion for recurrence was 4 or FDG avid on PET scan done in late 2024 presenting to [facility] on 0/11 with acute hypoxic respiratory failure secondary to community-acquired pneumonia complicated by septic shock.    4/18: Care transferred to [facility] 4/21: NGT placed on 4/20 for initiation of tube feeds.  Patient was tolerating however tube may have been dislodged overnight.  Coarse breath sounds and tube was found to be coiled up in the back of the throat.  First tube discontinued and new tube replaced with assistance of ICU charge RN. 4/22.  Tube feed resumed after NG tube confirmed in place. 4/23.  Patient lethargic.  MRI of the brain with and without contrast has motion degradation but did not see any acute stroke or  mass. 4/24.  Patient seen with wife at the bedside.  Patient was able to open his eyes with sternal rub but had try to answer a few questions and shook his head and was able to squeeze my hand.  Wife wants me to limit medications that cause altered mental status and wanted me to decrease his dose of Celexa . 4/25.  Patient able to talk to me today.  Patient able to follow some simple commands.  Patient stated he feel terrible but could not elaborate. 4/26.  Patient pulled out the Dobbhoff tube last night.  NG tube replaced and in proper position.  Will restart tube feeds.  Patient's wife gave him a sip of water  and he did not choke.  Patients mental status has improved over the last couple days. 4/27.  Will hold Lovenox  injections for potential PEG tomorrow. 4/28.  Seen by SLP and placed pured diet with thin liquids.  Remains high risk for aspiration and malnutrition.  Patient's wife wants to hold off on PEG feeding tube. 4/29.  Wife said he ate a good lunch and she fed him.  She definitely wants to hold off on the feeding tube.  "   I assumed care on 4/30.  4/30--5/1 -- clinically stable and no acute events.  Patient medically stable for discharge home with home health.   Assessment and Plan:   Acute hypoxic respiratory failure [facility]) Patient required intubation during the hospital course and extubated on 4/17.  On 4/20 was weaned to 3 L nasal cannula.  Patient completed course  of antibiotics.   Now stable on room air.   Continue nebulizers and scopolamine  patch (for secretions).   Wife requested suction machine for home.  TOC worked with DME company on this.   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  Patient does have a port.  Appeared chronic as per radiologist.   Lovenox  initially >> transitioned back to Eliquis     Acute metabolic encephalopathy Mental status improved to baseline    NSTEMI (non-ST elevated myocardial infarction) (HCC) Likely  from septic shock.   Completed 72 hours of heparin  drip.   Cardiology was consulted, did not recommend any intervention/invasive diagnostics.   Echocardiogram showing a normal EF at 50%.   --Low-dose Toprol .   --ASA dc'd since on Eliquis .   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  Best creatinine down at 1.37.  Last creatinine 1.66>> 1.63, consistent with likely baseline chronic kidney disease stage IIIa   Small cell lung cancer - left lung CT chest concerning for cancer recurrence.  MRI of the brain did not show any masses.  Dr. Hobart Lulas pulmonary to follow-up with patient about timing of bronchoscopy.   Septic shock (HCC) Secondary to multifocal pneumonia.   Completed antibiotics.   Resolved.   Uncontrolled type 2 diabetes mellitus with hyperglycemia, without long-term current use of insulin  (HCC) Last hemoglobin A1c 7.2.  I Added Semglee  insulin  5 units at bedtime since/sugar over 200.  Covered with sliding scale insulin . Resume glimepiride  at d/c Monitor closely for hypoglycemia esp if PO intake is po         Consultants: PCCM, Cardiology, GI, Palliative Care Procedures performed: as above  Disposition: Home health Diet recommendation:  Dysphagia type 1   Liquid DISCHARGE MEDICATION: Allergies as of 07/08/2023       Reactions   Penicillin G Anaphylaxis   Other reaction(s): Other (See Comments) Couldn't breath among other things Has patient had a PCN reaction causing immediate rash, facial/tongue/throat swelling, SOB or lightheadedness with hypotension: Yes Has patient had a PCN reaction causing severe rash involving mucus membranes or skin necrosis: No Has patient had a PCN reaction that required hospitalization: Yes Has patient had a PCN reaction occurring within the last 10 years: No If all of the above answers are "NO", then may proceed with Ce   Shellfish-derived Products Anaphylaxis   Statins    Other reaction(s): Other (See Comments) Muscle  spasms - can't walk - couldn't turn over in bed   Erythromycin Itching   All mycins   Tamsulosin    Other reaction(s): Dizziness   Tuberculin Ppd    Other reaction(s): Other (See Comments) False test    Iodinated Contrast Media Hives        Medication List     TAKE these medications    apixaban  5 MG Tabs tablet Commonly known as: ELIQUIS  Take 2 tablets (10 mg total) by mouth 2 (two) times daily for 5 days, THEN 1 tablet (5 mg total) 2 (two) times daily for 25 days. Start taking on: Jul 08, 2023   Apple Cider Vinegar 188 MG Caps Take by mouth.   ascorbic acid 500 MG tablet Commonly known as: VITAMIN C Take 500 mg by mouth daily.   aspirin  81 MG chewable tablet Chew 4 tablets (324 mg total) by mouth daily. What changed: how much to take   blood glucose meter kit and supplies Kit Dispense based on patient and insurance preference. Use up to four times daily as  directed. ICD: E11.9   cholecalciferol 25 MCG (1000 UNIT) tablet Commonly known as: VITAMIN D3 Take 1,000 Units by mouth daily.   citalopram  20 MG tablet Commonly known as: CELEXA  Take 20 mg by mouth daily.   colchicine 0.6 MG tablet Take by mouth.   cyanocobalamin  500 MCG tablet Commonly known as: VITAMIN B12 Take 500 mcg by mouth daily.   ezetimibe 10 MG tablet Commonly known as: ZETIA Take 10 mg by mouth daily.   Garlic 2 MG Caps Take by mouth.   glimepiride  4 MG tablet Commonly known as: AMARYL  Take 1 tablet (4 mg total) by mouth daily with breakfast.   insulin  glargine 100 UNIT/ML injection Commonly known as: LANTUS  Inject 0.05 mLs (5 Units total) into the skin daily. What changed: how much to take   metoprolol  succinate 25 MG 24 hr tablet Commonly known as: TOPROL -XL Take 0.5 tablets (12.5 mg total) by mouth at bedtime.   multivitamin with minerals Tabs tablet Take 1 tablet by mouth daily.   OMEGA-3 FISH OIL PO Take by mouth.   pantoprazole  40 MG tablet Commonly known as:  PROTONIX  Take 1 tablet (40 mg total) by mouth daily.   polyvinyl alcohol  1.4 % ophthalmic solution Commonly known as: LIQUIFILM TEARS Place 2 drops into both eyes as needed for dry eyes.   protein supplement shake Liqd Commonly known as: PREMIER PROTEIN Take 325 mLs (11 oz total) by mouth 4 (four) times daily.   scopolamine  1 MG/3DAYS Commonly known as: TRANSDERM-SCOP Place 1 patch (1.5 mg total) onto the skin every 3 (three) days.   Trelegy Ellipta  100-62.5-25 MCG/ACT Aepb Generic drug: Fluticasone -Umeclidin-Vilant Inhale 1 puff into the lungs daily. What changed: Another medication with the same name was removed. Continue taking this medication, and follow the directions you see here.   zinc sulfate (50mg  elemental zinc) 220 (50 Zn) MG capsule Take 220 mg by mouth daily.        Follow-up Information     Custovic, Sabina, DO Follow up on 07/15/2023.   Specialty: Cardiology Why: Go @ 12:00pm. Contact information: [address] [place] [address] [phone]         Annitta Kindler, MD Follow up on 07/21/2023.   Specialty: Pulmonary Disease Why: Go @ 2:00pm. Contact information: [address] [place] [address] [phone]         Macie Saxon, MD. Go on 07/30/2023.   Specialty: Family Medicine Why: Go @ 9:20am. Contact information: [address] [place] [address] [phone]         Care, [facility] Follow up.   Why: They will follow up with you for your home health needs. Contact information: [address] [place] [address] [phone]                Discharge Exam: Cleavon Curls Weights   07/05/23 0215 07/06/23 0500 07/07/23 0631  Weight: 93 kg 91 kg 92 kg   General exam: sleeping comfortably, wakes to voice, no acute distress Respiratory system: CTAB, no wheezes, rales or rhonchi, normal respiratory effort. Cardiovascular system: normal S1/S2, RRR, port in upper chest  wall Gastrointestinal system: soft, NT, ND Central nervous system: no gross focal neurologic deficits on exam limited by somnolence Skin: dry, intact, normal temperature    Condition at discharge: stable  The results of significant diagnostics from this hospitalization (including imaging, microbiology, ancillary and laboratory) are listed below for reference.   Imaging Studies: DG Swallowing [facility] Pathology  Result Date: 07/06/2023 CLINICAL DATA:  Dysphagia. Cough/GE reflux disease/other secondary diagnosis EXAM: MODIFIED BARIUM SWALLOW TECHNIQUE: Different consistencies of barium were administered orally to the patient by the Speech Pathologist. Imaging of the pharynx was performed in the lateral projection. Radiologist, not in attendance for the exam. Different consistencies of barium were administered orally to the patient by the Speech Pathologist. Imaging of the pharynx was performed in the lateral projection. The radiologist was present in the fluoroscopy room for this study, providing personal supervision. FLUOROSCOPY TIME:  Radiation Exposure Index (as provided by the fluoroscopic device): 4 minutes 12 seconds 16.60 mGy COMPARISON:  None Available. FINDINGS: Modified barium swallow was performed by the speech pathologist. Radiologist was not involved with this exam. Please refer to the Speech Pathology report for results and recommendations. IMPRESSION: Please refer to the Speech Pathologists report for complete details and recommendations. Electronically Signed   By: Bettylou Brunner M.D.   On: 07/06/2023 07:15   DG Abd 1 View Result Date: 07/03/2023 CLINICAL DATA:  NG tube placement. EXAM: ABDOMEN - 1 VIEW COMPARISON:  One-view abdomen 06/28/2023. FINDINGS: Heart is mildly enlarged. Lung bases are clear. Side port of the NG tube is in the stomach. Bowel gas to scratched at the visualized bowel gas pattern is normal. IMPRESSION: Side port of the NG tube is in the stomach. Electronically  Signed   By: Audree Leas M.D.   On: 07/03/2023 12:37   US  Venous Img Upper Uni Right(DVT) Result Date: 07/01/2023 CLINICAL DATA:  Right upper extremity swelling EXAM: RIGHT UPPER EXTREMITY VENOUS DOPPLER ULTRASOUND TECHNIQUE: Gray-scale sonography with graded compression, as well as color Doppler and duplex ultrasound were performed to evaluate the upper extremity deep venous system from the level of the subclavian vein and including the jugular, axillary, basilic, radial, ulnar and upper cephalic vein. Spectral Doppler was utilized to evaluate flow at rest and with distal augmentation maneuvers. COMPARISON:  None available FINDINGS: Contralateral Subclavian Vein: Respiratory phasicity is normal and symmetric with the symptomatic side. No evidence of thrombus. Normal compressibility. Internal Jugular Vein: Echogenic linear intraluminal filling defect within the right internal jugular vein suspicious for nonocclusive chronic thrombus. Subclavian Vein: No evidence of thrombus. Normal compressibility, respiratory phasicity and response to augmentation. Axillary Vein: No evidence of thrombus. Normal compressibility, respiratory phasicity and response to augmentation. Cephalic Vein: Not visualized. Basilic Vein: No evidence of thrombus. Normal compressibility, respiratory phasicity and response to augmentation. Brachial Veins: No evidence of thrombus. Normal compressibility, respiratory phasicity and response to augmentation. Radial Veins: No evidence of thrombus. Normal compressibility, respiratory phasicity and response to augmentation. Ulnar Veins: No evidence of thrombus. Normal compressibility, respiratory phasicity and response to augmentation. Venous Reflux:  None visualized. Other Findings:  None visualized. IMPRESSION: 1.  No evidence of DVT within the right upper extremity. 2. 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 These results will be called to the ordering clinician or  representative by the Radiologist Assistant, and communication documented in the PACS or [employer]. Electronically Signed   By: Elester Grim M.D.   On: 07/01/2023 13:31   DG Chest Port 1 View Result Date: 07/01/2023 CLINICAL DATA:  Cough EXAM: PORTABLE CHEST 1 VIEW COMPARISON:  06/28/2023 FINDINGS: Right Port-A-Cath remains in place, unchanged. Heart mediastinal contours within normal limits. Patchy bilateral mid and lower lung opacities, similar to prior study. No visible significant effusions. No acute bony abnormality. IMPRESSION: Stable patchy bilateral mid and lower lung opacities. Electronically Signed   By: Janeece Mechanic  M.D.   On: 07/01/2023 08:20   MR BRAIN W WO CONTRAST Result Date: 07/01/2023 CLINICAL DATA:  Metastatic disease evaluation EXAM: MRI HEAD WITHOUT AND WITH CONTRAST TECHNIQUE: Multiplanar, multiecho pulse sequences of the brain and surrounding structures were obtained without and with intravenous contrast. CONTRAST:  9mL GADAVIST  GADOBUTROL  1 MMOL/ML IV SOLN COMPARISON:  05/06/2017, 06/24/2023 FINDINGS: Motion degraded examination. Brain: No acute infarct, mass effect or extra-axial collection. Chronic hemorrhage at the right middle cerebellar peduncle. Bilateral basal ganglia mineralization/siderosis. There is confluent hyperintense T2-weighted signal within the white matter. Generalized volume loss. The midline structures are normal. There are no visible contrast-enhancing lesions, but the postcontrast images are severely motion degraded. Vascular: Allowing for motion, normal flow voids. Skull and upper cervical spine: Normal calvarium and skull base. Visualized upper cervical spine and soft tissues are normal. Sinuses/Orbits:No paranasal sinus fluid levels or advanced mucosal thickening. Right mastoid effusion. Normal orbits. IMPRESSION: 1. Motion degraded examination. 2. No visible contrast-enhancing lesions, but the postcontrast images are severely motion degraded. 3. Findings  of chronic small vessel ischemia and volume loss. Electronically Signed   By: Juanetta Nordmann M.D.   On: 07/01/2023 00:54   DG Swallowing Func-Speech Pathology Result Date: 06/30/2023 CLINICAL DATA:  Dysphagia. Cough/GE reflux disease/other secondary diagnosis EXAM: MODIFIED BARIUM SWALLOW TECHNIQUE: Different consistencies of barium were administered orally to the patient by the Speech Pathologist. Imaging of the pharynx was performed in the lateral projection. Radiologist, not in attendance for the exam. Different consistencies of barium were administered orally to the patient by the Speech Pathologist. Imaging of the pharynx was performed in the lateral projection. The radiologist was present in the fluoroscopy room for this study, providing personal supervision. FLUOROSCOPY TIME:  Radiation Exposure Index (as provided by the fluoroscopic device): 5 minutes 18 seconds 37.20 mGy COMPARISON:  None Available. FINDINGS: Modified barium swallow was performed by the speech pathologist. Radiologist was not involved with this exam. Please refer to the Speech Pathology report for results and recommendations. IMPRESSION: Please refer to the Speech Pathologists report for complete details and recommendations. Electronically Signed   By: Bettylou Brunner M.D.   On: 06/30/2023 11:16   DG Abd 1 View Result Date: 06/28/2023 CLINICAL DATA:  Feeding tube repositioning EXAM: ABDOMEN - 1 VIEW COMPARISON:  06/28/2023 at 10:29 a.m. FINDINGS: The feeding tube tip is in the stomach antrum, close to the pyloric channel. Unremarkable bowel gas pattern.  Mild lower lumbar spondylosis IMPRESSION: 1. Feeding tube tip is in the stomach antrum, close to the pyloric channel. Electronically Signed   By: Freida Jes M.D.   On: 06/28/2023 15:20   DG Abd 1 View Result Date: 06/28/2023 CLINICAL DATA:  Feeding tube assessment/positioning EXAM: ABDOMEN - 1 VIEW COMPARISON:  06/27/2023 FINDINGS: The feeding tube extends into the stomach  right undergoes a 180 degree turn and terminates in the vicinity of the stomach fundus. Linear subsegmental atelectasis or scarring at the lung bases. Lower thoracic spondylosis. Visualized upper abdominal bowel gas pattern unremarkable. IMPRESSION: 1. Feeding tube terminates in the vicinity of the stomach fundus. Electronically Signed   By: Freida Jes M.D.   On: 06/28/2023 15:18   DG Chest Port 1 View Result Date: 06/28/2023 CLINICAL DATA:  Hypoxia. EXAM: PORTABLE CHEST 1 VIEW COMPARISON:  06/22/2023.  Chest CT dated 06/22/2023 FINDINGS: Normal sized heart. Mildly tortuous aorta. Stable right jugular porta catheter with its tip in the superior vena cava. Mild scattered patchy densities in the inferior half of each lung with overall  improvement bilaterally. Minimal right pleural effusion. Thoracic spine degenerative changes. Feeding tube coiled in the neck with its tip in the mid neck. IMPRESSION: 1. Feeding tube coiled in the neck with its tip in the mid neck. Recommend removal and replacement. 2. Mild scattered patchy densities in the inferior half of each lung with overall improvement bilaterally. This most likely represents a combination of improving pneumonia and atelectasis. 3. Minimal right pleural effusion. These results will be called to the ordering clinician or representative by the Radiologist Assistant, and communication documented in the PACS or [employer]. Electronically Signed   By: Catherin Closs M.D.   On: 06/28/2023 12:14   DG Abd 1 View Result Date: 06/27/2023 CLINICAL DATA:  83 year old male enteric tube placement. Lung cancer. EXAM: ABDOMEN - 1 VIEW COMPARISON:  06/18/2023. FINDINGS: AP view at 1114 hours. NG type tube has been removed and enteric feeding tube now is in place terminating in the left upper quadrant, level of the proximal gastric body. Right chest Port-A-Cath is visible. Stable visible chest and osseous structures. IMPRESSION: Enteric tube placed into the  proximal stomach. Advancement would be necessary to allow for post pyloric transit. Electronically Signed   By: Marlise Simpers M.D.   On: 06/27/2023 11:18   CT CHEST WO CONTRAST Result Date: 06/24/2023 CLINICAL DATA:  History of non-small-cell lung cancer. Acute hypoxic respiratory failure. * Tracking Code: BO * EXAM: CT CHEST WITHOUT CONTRAST TECHNIQUE: Multidetector CT imaging of the chest was performed following the standard protocol without IV contrast. RADIATION DOSE REDUCTION: This exam was performed according to the departmental dose-optimization program which includes automated exposure control, adjustment of the mA and/or kV according to patient size and/or use of iterative reconstruction technique. COMPARISON:  Chest radiograph dated 06/22/2023, CT chest dated 12/05/2022 FINDINGS: Cardiovascular: Right chest wall port terminates at the lower SVC. Normal heart size. No significant pericardial fluid/thickening. Great vessels are normal in course and caliber. Coronary artery calcifications and aortic atherosclerosis. Mediastinum/Nodes: Imaged thyroid gland without nodules meeting criteria for imaging follow-up by size. Normal esophagus. 10 mm right paratracheal lymph node (2:72), unchanged. New 16 mm right hilar lymph node (2:86) and increased size of 11 mm subcarinal (2:91) lymph node, previously 6 mm. Lungs/Pleura: The central airways are patent. Biapical paraseptal emphysema. Trace secretions within the trachea. Bilateral lower lobe subsegmental atelectasis. Irregular left upper lobe perihilar masslike consolidation with associated traction bronchiectasis, which may reflect posttreatment changes. Diffuse bilateral peribronchovascular consolidation and ground-glass opacities. No pneumothorax. Small bilateral pleural effusions. Upper abdomen: Normal. Musculoskeletal: No acute or abnormal lytic or blastic osseous lesions. Partially imaged subcutaneous soft tissue stranding of the right distal arm near the elbow.  IMPRESSION: 1. Diffuse bilateral peribronchovascular consolidation and ground-glass opacities, which may reflect multifocal pneumonia or pulmonary edema. 2. Small bilateral pleural effusions. 3. Irregular left upper lobe perihilar masslike consolidation with associated traction bronchiectasis, which may reflect posttreatment changes. 4. New 16 mm right hilar lymph node and increased size of 11 mm subcarinal lymph node, which may be reactive or metastatic. Recommend attention on follow-up. 5. Partially imaged subcutaneous soft tissue stranding of the right distal arm near the elbow, which may reflect cellulitis. Recommend correlation with physical exam. 6. Aortic Atherosclerosis (ICD10-I70.0) and Emphysema (ICD10-J43.9). Coronary artery calcifications. Assessment for potential risk factor modification, dietary therapy or pharmacologic therapy may be warranted, if clinically indicated. Electronically Signed   By: Limin  Xu M.D.   On: 06/24/2023 18:26   CT HEAD WO CONTRAST ( ) Result Date: 06/24/2023 CLINICAL  DATA:  Mental status change. EXAM: CT HEAD WITHOUT CONTRAST TECHNIQUE: Contiguous axial images were obtained from the base of the skull through the vertex without intravenous contrast. RADIATION DOSE REDUCTION: This exam was performed according to the departmental dose-optimization program which includes automated exposure control, adjustment of the mA and/or kV according to patient size and/or use of iterative reconstruction technique. COMPARISON:  MRI head 05/04/2017. FINDINGS: Brain: No acute intracranial hemorrhage. No CT evidence of acute infarct. Nonspecific hypoattenuation in the periventricular and subcortical white matter favored to reflect chronic microvascular ischemic changes. 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 No edema, mass effect, or midline shift. The basilar cisterns are patent. Ventricles: Prominence of the ventricles suggesting underlying parenchymal  volume loss. Vascular: Atherosclerotic calcifications of the carotid siphons. No hyperdense vessel. Skull: No acute or aggressive finding. Orbits: Orbits are symmetric. Sinuses: Mild mucosal thickening in the left sphenoid sinus. No air-fluid levels. Other: Large right mastoid effusion. IMPRESSION: No CT evidence of acute intracranial abnormality. 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 Moderate chronic microvascular ischemic changes and parenchymal volume loss. Large right mastoid effusion. Recommend correlation for evidence of mastoiditis. Electronically Signed   By: Denny Flack M.D.   On: 06/24/2023 10:03   DG Chest Port 1 View Result Date: 06/22/2023 CLINICAL DATA:  Hypoxia.  Acute respiratory failure. EXAM: PORTABLE CHEST 1 VIEW COMPARISON:  06/19/2023 FINDINGS: Endotracheal tube tip is approximately 4.2 cm above the base of the carina. The NG tube passes into the stomach although the distal tip position is not included on the film. Right Port-A-Cath remains in place. Similar asymmetric elevation right hemidiaphragm with patchy airspace disease in both lung bases, right greater than left. Interstitial markings are diffusely coarsened with chronic features. Telemetry leads overlie the chest. IMPRESSION: 1. Endotracheal tube tip is approximately 4.2 cm above the base of the carina. 2. Patchy airspace disease in both lung bases, right greater than left. Imaging features could be related to atelectasis or infiltrate. Electronically Signed   By: Donnal Fusi M.D.   On: 06/22/2023 07:37   ECHOCARDIOGRAM COMPLETE Result Date: 06/19/2023    ECHOCARDIOGRAM REPORT   Patient Name:   ARMANDO MCEVOY Date of Exam: 06/19/2023 Medical Rec #:  [id number]     Height:       75.0 in Accession #:    [id number]    Weight:       193.3 lb Date of Birth:  06/27/1940    BSA:          2.163 m Patient Age:    82 years      BP:           124/72 mmHg Patient Gender: M             HR:           63 bpm. Exam  Location:  [facility] Procedure: 2D Echo, Intracardiac Opacification Agent, Cardiac Doppler and Color            Doppler (Both Spectral and Color Flow Doppler were utilized during            procedure). Indications:     Elevated Troponin  History:         Patient has prior history of Echocardiogram examinations, most                  recent 05/07/2017.  Sonographer:     Clenton Czech RDCS, FASE Referring Phys:  [phone] [facility]  S TUKOV-YUAL Diagnosing Phys: Debborah Fairly  Sonographer Comments: Echo performed with patient supine and on artificial respirator. Image acquisition challenging due to respiratory motion. IMPRESSIONS  1. Left ventricular ejection fraction, by estimation, is 50 to 55%. The left ventricle has low normal function. The left ventricle demonstrates regional wall motion abnormalities (see scoring diagram/findings for description). There is mild concentric left ventricular hypertrophy. Left ventricular diastolic parameters are consistent with Grade I diastolic dysfunction (impaired relaxation).  2. Right ventricular systolic function is normal. The right ventricular size is normal.  3. Left atrial size was mild to moderately dilated.  4. Right atrial size was mild to moderately dilated.  5. The mitral valve is normal in structure. Mild mitral valve regurgitation. No evidence of mitral stenosis.  6. The aortic valve is normal in structure. Aortic valve regurgitation is trivial. No aortic stenosis is present.  7. The inferior vena cava is normal in size with greater than 50% respiratory variability, suggesting right atrial pressure of 3 mmHg. Conclusion(s)/Recommendation(s): Apical wall hypokinetic. FINDINGS  Left Ventricle: Left ventricular ejection fraction, by estimation, is 50 to 55%. The left ventricle has low normal function. The left ventricle demonstrates regional wall motion abnormalities. Definity  contrast agent was given IV to delineate the left ventricular endocardial borders. Strain was  performed and the global longitudinal strain is indeterminate. The left ventricular internal cavity size was normal in size. There is mild concentric left ventricular hypertrophy. Left ventricular diastolic parameters are consistent with Grade I diastolic dysfunction (impaired relaxation).  LV Wall Scoring: The apex is akinetic. Right Ventricle: The right ventricular size is normal. No increase in right ventricular wall thickness. Right ventricular systolic function is normal. Left Atrium: Left atrial size was mild to moderately dilated. Right Atrium: Right atrial size was mild to moderately dilated. Pericardium: There is no evidence of pericardial effusion. Mitral Valve: The mitral valve is normal in structure. Mild mitral valve regurgitation. No evidence of mitral valve stenosis. Tricuspid Valve: The tricuspid valve is normal in structure. Tricuspid valve regurgitation is mild . No evidence of tricuspid stenosis. Aortic Valve: The aortic valve is normal in structure. Aortic valve regurgitation is trivial. No aortic stenosis is present. Aortic valve peak gradient measures 5.8 mmHg. Pulmonic Valve: The pulmonic valve was normal in structure. Pulmonic valve regurgitation is not visualized. No evidence of pulmonic stenosis. Aorta: The aortic root is normal in size and structure. Venous: The inferior vena cava is normal in size with greater than 50% respiratory variability, suggesting right atrial pressure of 3 mmHg. IAS/Shunts: No atrial level shunt detected by color flow Doppler. Additional Comments: 3D was performed not requiring image post processing on an independent workstation and was indeterminate.  LEFT VENTRICLE PLAX 2D LVIDd:         4.20 cm     Diastology LVIDs:         3.00 cm     LV e' medial:    5.33 cm/s LV PW:         1.10 cm     LV E/e' medial:  13.6 LV IVS:        0.80 cm     LV e' lateral:   9.03 cm/s                            LV E/e' lateral: 8.0  LV Volumes (MOD) LV vol d, MOD A2C: 70.2 ml LV vol  d, MOD A4C: 80.1 ml LV vol s,  MOD A2C: 35.2 ml LV vol s, MOD A4C: 35.4 ml LV SV MOD A2C:     35.0 ml LV SV MOD A4C:     80.1 ml LV SV MOD BP:      40.4 ml RIGHT VENTRICLE RV Basal diam:  2.70 cm RV S prime:     10.70 cm/s TAPSE (M-mode): 2.3 cm LEFT ATRIUM             Index        RIGHT ATRIUM           Index LA diam:        3.60 cm 1.66 cm/m   RA Area:     15.80 cm LA Vol (A2C):   39.3 ml 18.17 ml/m  RA Volume:   38.50 ml  17.80 ml/m LA Vol (A4C):   27.2 ml 12.57 ml/m LA Biplane Vol: 33.8 ml 15.63 ml/m  AORTIC VALVE AV Vmax:      120.00 cm/s AV Peak Grad: 5.8 mmHg LVOT Vmax:    108.00 cm/s LVOT Vmean:   56.900 cm/s LVOT VTI:     0.166 m MITRAL VALVE               TRICUSPID VALVE MV Area (PHT): 3.10 cm    TR Peak grad:   28.5 mmHg MV Decel Time: 245 msec    TR Vmax:        267.00 cm/s MV E velocity: 72.30 cm/s MV A velocity: 84.90 cm/s  SHUNTS MV E/A ratio:  0.85        Systemic VTI: 0.17 m Debborah Fairly Electronically signed by Debborah Fairly Signature Date/Time: 06/19/2023/1:07:07 PM    Final    DG Chest Port 1 View Result Date: 06/19/2023 CLINICAL DATA:  Respiratory failure EXAM: PORTABLE CHEST 1 VIEW COMPARISON:  Yesterday FINDINGS: Endotracheal tube with tip between the clavicular heads and carina. The enteric tube at least reaches the stomach. Right port with tip at the SVC. Artifact from EKG leads. Hazy appearance at the lung bases likely reflecting atelectasis. Small pleural effusions. No pneumothorax. IMPRESSION: Stable hardware positioning and lower chest infiltrates. Electronically Signed   By: Ronnette Coke M.D.   On: 06/19/2023 05:13   DG Chest Portable 1 View Result Date: 06/18/2023 CLINICAL DATA:  ET tube placement EXAM: PORTABLE CHEST 1 VIEW COMPARISON:  06/18/2023 FINDINGS: Endotracheal tube tip appears to be in the midtrachea. The carina is difficult to visualize. Right Port-A-Cath remains in place, unchanged. Heart mediastinal contours are within normal limits. Patchy opacities in the  left mid lung and both lower lobes, increasing in the right lower lobe since prior study. IMPRESSION: Endotracheal tube tip appears to be in the midtrachea. Carina difficult to visualize. Patchy bilateral airspace opacities, increasing in the right lung base since prior study. Electronically Signed   By: Janeece Mechanic M.D.   On: 06/18/2023 20:31   DG Abdomen 1 View Result Date: 06/18/2023 CLINICAL DATA:  OG tube placement EXAM: ABDOMEN - 1 VIEW COMPARISON:  None Available. FINDINGS: OG tube tip in the proximal stomach with the side port near the GE junction. Nonobstructive bowel gas pattern. IMPRESSION: OG tube tip in the proximal stomach with the side port at the GE junction. Electronically Signed   By: Janeece Mechanic M.D.   On: 06/18/2023 20:05   DG Chest Port 1 View Result Date: 06/18/2023 CLINICAL DATA:  Sepsis EXAM: PORTABLE CHEST 1 VIEW COMPARISON:  Chest x-ray 05/20/2017 FINDINGS: Right chest port catheter tip ends in  the distal SVC. The cardiomediastinal silhouette is within normal limits. There is focal airspace disease in the left mid lung and left lower lung. This is new from prior. Costophrenic angles are clear. No pneumothorax or acute fracture. IMPRESSION: Focal airspace disease in the left mid lung and left lower lung concerning for multifocal pneumonia. Recommend follow-up chest x-ray in 4-6 weeks to confirm resolution. Electronically Signed   By: Tyron Gallon M.D.   On: 06/18/2023 18:40    Microbiology: Results for orders placed or performed during the hospital encounter of 06/18/23  Culture, blood (Routine x 2)     Status: None   Collection Time: 06/18/23  5:18 PM   Specimen: BLOOD  Result Value Ref Range Status   Specimen Description BLOOD BLOOD RIGHT FOREARM  Final   Special Requests   Final    BOTTLES DRAWN AEROBIC AND ANAEROBIC Blood Culture results may not be optimal due to an inadequate volume of blood received in culture bottles   Culture   Final    NO GROWTH 5  DAYS Performed at [facility], [address] [place], [place] [address]    Report Status 06/23/2023 FINAL  Final  Culture, blood (Routine x 2)     Status: Abnormal   Collection Time: 06/18/23  5:18 PM   Specimen: BLOOD  Result Value Ref Range Status   Specimen Description   Final    BLOOD BLOOD RIGHT ARM Performed at [facility], [address], [place], [place] [address]    Special Requests   Final    BOTTLES DRAWN AEROBIC AND ANAEROBIC Blood Culture results may not be optimal due to an inadequate volume of blood received in culture bottles Performed at [facility], [address] [place], [place] [address]    Culture  Setup Time   Final    GRAM POSITIVE COCCI AEROBIC BOTTLE ONLY CRITICAL RESULT CALLED TO, READ BACK BY AND VERIFIED WITH: Theora Flair PATEL @1102  06/19/23 SKY    Culture (A)  Final    STAPHYLOCOCCUS HOMINIS THE SIGNIFICANCE OF ISOLATING THIS ORGANISM FROM A SINGLE SET OF BLOOD CULTURES WHEN MULTIPLE SETS ARE DRAWN IS UNCERTAIN. PLEASE NOTIFY THE MICROBIOLOGY DEPARTMENT WITHIN ONE WEEK IF SPECIATION AND SENSITIVITIES ARE REQUIRED. Performed at [facility], [address] [place], [place] [address]    Report Status 06/21/2023 FINAL  Final  Blood Culture ID Panel (Reflexed)     Status: Abnormal   Collection Time: 06/18/23  5:18 PM  Result Value Ref Range Status   Enterococcus faecalis NOT DETECTED NOT DETECTED Final   Enterococcus Faecium NOT DETECTED NOT DETECTED Final   Listeria monocytogenes NOT DETECTED NOT DETECTED Final   Staphylococcus species DETECTED (A) NOT DETECTED Final    Comment: CRITICAL RESULT CALLED TO, READ BACK BY AND VERIFIED WITH: Theora Flair PATEL @ 1102 06/19/23 SKY    Staphylococcus aureus (BCID) NOT DETECTED NOT DETECTED Final   Staphylococcus epidermidis NOT DETECTED NOT DETECTED Final   Staphylococcus lugdunensis NOT DETECTED NOT DETECTED Final   Streptococcus species NOT DETECTED NOT DETECTED Final    Streptococcus agalactiae NOT DETECTED NOT DETECTED Final   Streptococcus pneumoniae NOT DETECTED NOT DETECTED Final   Streptococcus pyogenes NOT DETECTED NOT DETECTED Final   A.calcoaceticus-baumannii NOT DETECTED NOT DETECTED Final   Bacteroides fragilis NOT DETECTED NOT DETECTED Final   Enterobacterales NOT DETECTED NOT DETECTED Final   Enterobacter cloacae complex NOT DETECTED NOT DETECTED Final   Escherichia coli NOT DETECTED NOT DETECTED Final  Klebsiella aerogenes NOT DETECTED NOT DETECTED Final   Klebsiella oxytoca NOT DETECTED NOT DETECTED Final   Klebsiella pneumoniae NOT DETECTED NOT DETECTED Final   Proteus species NOT DETECTED NOT DETECTED Final   Salmonella species NOT DETECTED NOT DETECTED Final   Serratia marcescens NOT DETECTED NOT DETECTED Final   Haemophilus influenzae NOT DETECTED NOT DETECTED Final   Neisseria meningitidis NOT DETECTED NOT DETECTED Final   Pseudomonas aeruginosa NOT DETECTED NOT DETECTED Final   Stenotrophomonas maltophilia NOT DETECTED NOT DETECTED Final   Candida albicans NOT DETECTED NOT DETECTED Final   Candida auris NOT DETECTED NOT DETECTED Final   Candida glabrata NOT DETECTED NOT DETECTED Final   Candida krusei NOT DETECTED NOT DETECTED Final   Candida parapsilosis NOT DETECTED NOT DETECTED Final   Candida tropicalis NOT DETECTED NOT DETECTED Final   Cryptococcus neoformans/gattii NOT DETECTED NOT DETECTED Final    Comment: Performed at [facility], [address] [place], [place] [address]  Resp panel by RT-PCR (RSV, Flu A&B, Covid) Anterior Nasal Swab     Status: None   Collection Time: 06/18/23  5:25 PM   Specimen: Anterior Nasal Swab  Result Value Ref Range Status   SARS Coronavirus 2 by RT PCR NEGATIVE NEGATIVE Final    Comment: (NOTE) SARS-CoV-2 target nucleic acids are NOT DETECTED.  The SARS-CoV-2 RNA is generally detectable in upper respiratory specimens during the acute phase of infection. The lowest concentration  of SARS-CoV-2 viral copies this assay can detect is 138 copies/mL. A negative result does not preclude SARS-Cov-2 infection and should not be used as the sole basis for treatment or other patient management decisions. A negative result may occur with  improper specimen collection/handling, submission of specimen other than nasopharyngeal swab, presence of viral mutation(s) within the areas targeted by this assay, and inadequate number of viral copies(<138 copies/mL). A negative result must be combined with clinical observations, patient history, and epidemiological information. The expected result is Negative.  Fact Sheet for Patients:  BloggerCourse.com  Fact Sheet for Healthcare Providers:  SeriousBroker.it  This test is no t yet approved or cleared by the [place]  FDA and  has been authorized for detection and/or diagnosis of SARS-CoV-2 by FDA under an Emergency Use Authorization (EUA). This EUA will remain  in effect (meaning this test can be used) for the duration of the COVID-19 declaration under Section 564(b)(1) of the Act, 21 U.S.C.section 360bbb-3(b)(1), unless the authorization is terminated  or revoked sooner.       Influenza A by PCR NEGATIVE NEGATIVE Final   Influenza B by PCR NEGATIVE NEGATIVE Final    Comment: (NOTE) The Xpert Xpress SARS-CoV-2/FLU/RSV plus assay is intended as an aid in the diagnosis of influenza from Nasopharyngeal swab specimens and should not be used as a sole basis for treatment. Nasal washings and aspirates are unacceptable for Xpert Xpress SARS-CoV-2/FLU/RSV testing.  Fact Sheet for Patients: BloggerCourse.com  Fact Sheet for Healthcare Providers: SeriousBroker.it  This test is not yet approved or cleared by the [place]  FDA and has been authorized for detection and/or diagnosis of SARS-CoV-2 by FDA under an Emergency Use  Authorization (EUA). This EUA will remain in effect (meaning this test can be used) for the duration of the COVID-19 declaration under Section 564(b)(1) of the Act, 21 U.S.C. section 360bbb-3(b)(1), unless the authorization is terminated or revoked.     Resp Syncytial Virus by PCR NEGATIVE NEGATIVE Final    Comment: (NOTE) Fact Sheet for Patients: BloggerCourse.com  Fact  Sheet for Healthcare Providers: SeriousBroker.it  This test is not yet approved or cleared by the [place]  FDA and has been authorized for detection and/or diagnosis of SARS-CoV-2 by FDA under an Emergency Use Authorization (EUA). This EUA will remain in effect (meaning this test can be used) for the duration of the COVID-19 declaration under Section 564(b)(1) of the Act, 21 U.S.C. section 360bbb-3(b)(1), unless the authorization is terminated or revoked.  Performed at [facility], [address] [place], [place] [address]   MRSA Next Gen by PCR, Nasal     Status: None   Collection Time: 06/18/23 10:06 PM   Specimen: Nasal Mucosa; Nasal Swab  Result Value Ref Range Status   MRSA by PCR Next Gen NOT DETECTED NOT DETECTED Final    Comment: (NOTE) The GeneXpert MRSA Assay (FDA approved for NASAL specimens only), is one component of a comprehensive MRSA colonization surveillance program. It is not intended to diagnose MRSA infection nor to guide or monitor treatment for MRSA infections. Test performance is not FDA approved in patients less than 97 years old. Performed at [facility], [address] [place], [place] [address]   Culture, Respiratory w Gram Stain     Status: None   Collection Time: 06/21/23 11:37 AM   Specimen: Tracheal Aspirate; Respiratory  Result Value Ref Range Status   Specimen Description   Final    TRACHEAL ASPIRATE Performed at [facility], [address] [place], [place] [address]    Special  Requests   Final    NONE Performed at [facility], [address] [place], [place] [address]    Gram Stain   Final    RARE WBC PRESENT, PREDOMINANTLY PMN NO ORGANISMS SEEN    Culture   Final    Normal respiratory flora-no Staph aureus or Pseudomonas seen Performed at [facility], [address], [place], [place] [address]    Report Status 06/24/2023 FINAL  Final  MRSA Next Gen by PCR, Nasal     Status: None   Collection Time: 06/21/23  9:54 PM   Specimen: Nasal Mucosa; Nasal Swab  Result Value Ref Range Status   MRSA by PCR Next Gen NOT DETECTED NOT DETECTED Final    Comment: (NOTE) The GeneXpert MRSA Assay (FDA approved for NASAL specimens only), is one component of a comprehensive MRSA colonization surveillance program. It is not intended to diagnose MRSA infection nor to guide or monitor treatment for MRSA infections. Test performance is not FDA approved in patients less than 70 years old. Performed at [facility], [address] [place], [place] [address]     Labs: CBC: No results for input(s): "WBC", "NEUTROABS", "HGB", "HCT", "MCV", "PLT" in the last 168 hours.  Basic Metabolic Panel: No results for input(s): "NA", "K", "CL", "CO2", "GLUCOSE", "BUN", "CREATININE", "CALCIUM", "MG", "PHOS" in the last 168 hours.  Liver Function Tests: No results for input(s): "AST", "ALT", "ALKPHOS", "BILITOT", "PROT", "ALBUMIN " in the last 168 hours. CBG: No results for input(s): "GLUCAP" in the last 168 hours.   Discharge time spent: less than 30 minutes.  Signed: Montey Apa, DO [facility] 07/16/2023

## 2023-07-07 NOTE — Plan of Care (Signed)
  Problem: Coping: Goal: Ability to adjust to condition or change in health will improve Outcome: Progressing   Problem: Nutritional: Goal: Maintenance of adequate nutrition will improve Outcome: Progressing   Problem: Skin Integrity: Goal: Risk for impaired skin integrity will decrease Outcome: Progressing   Problem: Activity: Goal: Risk for activity intolerance will decrease Outcome: Progressing   Problem: Nutrition: Goal: Adequate nutrition will be maintained Outcome: Progressing   Problem: Coping: Goal: Level of anxiety will decrease Outcome: Progressing   Problem: Pain Managment: Goal: General experience of comfort will improve and/or be controlled Outcome: Progressing   Problem: Safety: Goal: Ability to remain free from injury will improve Outcome: Progressing   Problem: Skin Integrity: Goal: Risk for impaired skin integrity will decrease Outcome: Progressing

## 2023-07-07 NOTE — Evaluation (Signed)
 Occupational Therapy Evaluation Patient Details Name: Edwin Jones MRN: [id number] DOB: [birth year] Today's Date: 07/07/2023   History of Present Illness   Pt is an 83 y/o M admitted on 06/18/23 after presenting to the ED with c/o respiratory distress. Pt required intubation, found to have multifocal PNA with sepsis. Pt also being treated for NSTEMI, septic shock. Pt extubated 06/23/23. PMH: DM2, HTN, small cell lung CA, COPD, Hep C, hypercholesterolemia, CVA with residual R sided deficits     Clinical Impressions Upon entering the room, pt supine in bed and agreeable to OT intervention. Pt does voice some single words and nods "yes" or"no" to questions to let therapist know needs. Total A to reposition pt in bed with use of bed features. OT following recommendations listed by SLP for safe feeding strategies. Pt needing assistance to load utensil but is able to bring to mouth. OT providing cues and hand over hand assistance with cup to take sips between bites of food. Pt shakes head to let therapist know he does not want to continue further with food. All needs within reach and pt remains upright after food per SLP recommendation.      If plan is discharge home, recommend the following:   Two people to help with walking and/or transfers;Two people to help with bathing/dressing/bathroom      Equipment Recommendations   None recommended by OT      Precautions/Restrictions   Precautions Precautions: Fall            ADL either performed or assessed with clinical judgement   ADL Overall ADL's : Needs assistance/impaired Eating/Feeding: Minimal assistance;Bed level                                           Vision Patient Visual Report: No change from baseline              Pertinent Vitals/Pain Pain Assessment Pain Assessment: No/denies pain     Extremity/Trunk Assessment Upper Extremity Assessment Upper Extremity Assessment: Generalized  weakness   Lower Extremity Assessment Lower Extremity Assessment: Generalized weakness       Communication Communication Communication: Impaired Factors Affecting Communication: Reduced clarity of speech;Difficulty expressing self   Cognition Arousal: Alert Behavior During Therapy: Flat affect Cognition: No apparent impairments                               Following commands: Intact Following commands impaired: Follows one step commands with increased time     Cueing  General Comments   Cueing Techniques: Verbal cues;Gestural cues;Tactile cues                             OT Goals(Current goals can be found in the care plan section)   Acute Rehab OT Goals OT Goal Formulation: With patient/family Time For Goal Achievement: 08/04/23   OT Frequency:  Min 2X/week       AM-PAC OT "6 Clicks" Daily Activity     Outcome Measure Help from another person eating meals?: Total Help from another person taking care of personal grooming?: Total Help from another person toileting, which includes using toliet, bedpan, or urinal?: Total Help from another person bathing (including washing, rinsing, drying)?: Total Help from another person to put on and taking off regular upper body  clothing?: Total Help from another person to put on and taking off regular lower body clothing?: Total 6 Click Score: 6   End of Session Nurse Communication: Mobility status  Activity Tolerance: Patient tolerated treatment well Patient left: in bed;with call bell/phone within reach;with bed alarm set  OT Visit Diagnosis: Other abnormalities of gait and mobility (R26.89);Muscle weakness (generalized) (M62.81);Hemiplegia and hemiparesis Hemiplegia - Right/Left: Right Hemiplegia - dominant/non-dominant: Dominant Hemiplegia - caused by: Cerebral infarction                Time: 1208-1225 OT Time Calculation (min): 17 min Charges:  OT General Charges $OT Visit: 1 Visit OT  Evaluation $OT Re-eval: 1 Re-eval OT Treatments $Self Care/Home Management : 8-22 mins

## 2023-07-07 NOTE — Inpatient Diabetes Management (Signed)
 Inpatient Diabetes Program Recommendations  AACE/ADA: New Consensus Statement on Inpatient Glycemic Control (2015)  Target Ranges:  Prepandial:   less than 140 mg/dL      Peak postprandial:   less than 180 mg/dL (1-2 hours)      Critically ill patients:  140 - 180 mg/dL   Lab Results  Component Value Date   GLUCAP 220 (H) 07/07/2023   HGBA1C 7.2 (H) 06/18/2023    Review of Glycemic Control  Latest Reference Range & Units 07/06/23 07:34 07/06/23 11:30 07/06/23 16:28 07/06/23 22:02 07/07/23 09:53  Glucose-Capillary 70 - 99 mg/dL 161 (H) 096 (H) 045 (H) 270 (H) 220 (H)  (H): Data is abnormally high Diabetes history: DM2 Outpatient Diabetes medications: Amaryl  4 mg daily, Lantus  10-12 units daily Current orders for Inpatient glycemic control: Semglee  5 units QHS, Novolog  0-15 units TID & HS   Inpatient Diabetes Program Recommendations:     Consider increasing Semglee  7 units at bedtime  Thanks, Marjo Sievert, MSN, RNC-OB Diabetes Coordinator [phone] (8a-5p)

## 2023-07-07 NOTE — TOC Progression Note (Addendum)
 Transition of Care [facility]) - Progression Note    Patient Details  Name: Edwin Jones MRN: [id number] Date of Birth: 10-Aug-1940  [facility] CM/SW Contact  Odilia Bennett, LCSW Phone Number: 07/07/2023, 9:44 AM  Clinical Narrative:  CSW spoke to a Child psychotherapist at the [facility]. She is sending a message to a different social worker there asking them to call CSW back.   11:10 am: Received call back from [facility]. They said to fax DME orders and that turnaround time is 5-7 business days for delivery but family can pick up the DME if small enough and that would likely be faster. Wife is aware. She still wants to hold off on hospital bed and trapeze bar for now. She is agreeable to ordering the suction machine through [employer] using his Medicare. Will order wedge and SCD's through the [place]. CSW sent secure chat to attending physician asking for DME orders. Wife stated she has no one to help her at home with him today but she would like for him to discharge in the morning. Will need ambulance transport. Address on facesheet is correct. Wife is aware if might receive a bill for transport.  1:28 pm: Ordered suction machine through [employer]  1:48 pm: Faxed DME orders to [facility] for wedge and SCD's.  Expected Discharge Plan and Services                                               Social Determinants of Health (SDOH) Interventions SDOH Screenings   Food Insecurity: Patient Unable To Answer (06/19/2023)  Housing: Patient Unable To Answer (06/19/2023)  Transportation Needs: Patient Unable To Answer (06/19/2023)  Utilities: Patient Unable To Answer (06/19/2023)  Social Connections: Patient Unable To Answer (06/19/2023)  Tobacco Use: Medium Risk (06/18/2023)    Readmission Risk Interventions    06/29/2023    1:27 PM  Readmission Risk Prevention Plan  PCP or Specialist Appt within 3-5 Days Complete  HRI or Home Care Consult Complete  Social Work Consult for Recovery Care  Planning/Counseling Complete  Palliative Care Screening Not Applicable

## 2023-07-08 ENCOUNTER — Ambulatory Visit (INDEPENDENT_AMBULATORY_CARE_PROVIDER_SITE_OTHER): Payer: Medicare Other | Admitting: Nurse Practitioner

## 2023-07-08 ENCOUNTER — Other Ambulatory Visit (HOSPITAL_COMMUNITY): Payer: Self-pay

## 2023-07-08 ENCOUNTER — Encounter (INDEPENDENT_AMBULATORY_CARE_PROVIDER_SITE_OTHER): Payer: Medicare Other

## 2023-07-08 DIAGNOSIS — J9601 Acute respiratory failure with hypoxia: Secondary | ICD-10-CM | POA: Diagnosis not present

## 2023-07-08 LAB — PHOSPHORUS: Phosphorus: 3.1 mg/dL (ref 2.5–4.6)

## 2023-07-08 LAB — GLUCOSE, CAPILLARY
Glucose-Capillary: 226 mg/dL — ABNORMAL HIGH (ref 70–99)
Glucose-Capillary: 194 mg/dL — ABNORMAL HIGH (ref 70–99)
Glucose-Capillary: 136 mg/dL — ABNORMAL HIGH (ref 70–99)
Glucose-Capillary: 124 mg/dL — ABNORMAL HIGH (ref 70–99)
Glucose-Capillary: 173 mg/dL — ABNORMAL HIGH (ref 70–99)
Glucose-Capillary: 230 mg/dL — ABNORMAL HIGH (ref 70–99)
Glucose-Capillary: 188 mg/dL — ABNORMAL HIGH (ref 70–99)

## 2023-07-08 LAB — MAGNESIUM: Magnesium: 2 mg/dL (ref 1.7–2.4)

## 2023-07-08 MED ORDER — INSULIN GLARGINE 100 UNIT/ML ~~LOC~~ SOLN: 5.0000 [IU] | Freq: Every day | SUBCUTANEOUS | 11 refills | Status: AC

## 2023-07-08 MED ORDER — METOPROLOL SUCCINATE ER 25 MG PO TB24: 12.5000 mg | ORAL_TABLET | Freq: Every day | ORAL | 1 refills | Status: AC

## 2023-07-08 MED ORDER — SCOPOLAMINE 1 MG/3DAYS TD PT72: 1.0000 | MEDICATED_PATCH | TRANSDERMAL | 1 refills | Status: AC

## 2023-07-08 MED ORDER — POLYVINYL ALCOHOL 1.4 % OP SOLN: 2.0000 [drp] | OPHTHALMIC | Status: AC | PRN

## 2023-07-08 MED ORDER — ADULT MULTIVITAMIN W/MINERALS CH: 1.0000 | ORAL_TABLET | Freq: Every day | ORAL | Status: AC

## 2023-07-08 MED ORDER — APIXABAN 5 MG PO TABS
ORAL_TABLET | ORAL | 0 refills | Status: DC
Start: 2023-07-08 — End: 2023-11-18

## 2023-07-08 NOTE — TOC Transition Note (Signed)
 Transition of Care [facility]) - Discharge Note   Patient Details  Name: Edwin Jones MRN: [id number] Date of Birth: 02-26-1941  Transition of Care [facility]) CM/SW Contact:  Odilia Bennett, LCSW Phone Number: 07/08/2023, 10:41 AM   Clinical Narrative:   Patient has orders to discharge home today. CSW left message for [facility] liaison to notify. [facility] arranged for around 1:30 per wife request. CSW asked RN to call her when they arrive. No further concerns. CSW signing of.  Final next level of care: Home w Home Health Services Barriers to Discharge: Barriers Resolved   Patient Goals and CMS Choice            Discharge Placement                Patient to be transferred to facility by: [facility] Name of family member notified: Armed forces training and education officer Patient and family notified of of transfer: 07/08/23  Discharge Plan and Services Additional resources added to the After Visit Summary for                  DME Arranged: Suction, Other see comment (Wedge and SCD's) DME Agency: [facility], [facility], [facility] Date DME Agency Contacted: 07/07/23   Representative spoke with at DME Agency: Suction: Mitch HH Arranged: RN, PT, OT HH Agency: [facility] Date [facility] Agency Contacted: 07/08/23   Representative spoke with at [facility] Agency: Bartholomew Light  Social Drivers of Health (SDOH) Interventions SDOH Screenings   Food Insecurity: Patient Unable To Answer (06/19/2023)  Housing: Patient Unable To Answer (06/19/2023)  Transportation Needs: Patient Unable To Answer (06/19/2023)  Utilities: Patient Unable To Answer (06/19/2023)  Social Connections: Patient Unable To Answer (06/19/2023)  Tobacco Use: Medium Risk (06/18/2023)     Readmission Risk Interventions    06/29/2023    1:27 PM  Readmission Risk Prevention Plan  PCP or Specialist Appt within 3-5 Days Complete  HRI or Home Care Consult Complete  Social Work Consult for  Recovery Care Planning/Counseling Complete  Palliative Care Screening Not Applicable

## 2023-07-21 ENCOUNTER — Ambulatory Visit (INDEPENDENT_AMBULATORY_CARE_PROVIDER_SITE_OTHER): Admitting: Pulmonary Disease

## 2023-07-21 ENCOUNTER — Encounter: Payer: Self-pay | Admitting: Pulmonary Disease

## 2023-07-21 ENCOUNTER — Encounter

## 2023-07-21 VITALS — BP 108/58 | HR 91 | Temp 97.1°F | Ht 75.0 in | Wt 202.0 lb

## 2023-07-21 DIAGNOSIS — Z87891 Personal history of nicotine dependence: Secondary | ICD-10-CM

## 2023-07-21 DIAGNOSIS — T8130XA Disruption of wound, unspecified, initial encounter: Secondary | ICD-10-CM

## 2023-07-21 DIAGNOSIS — J439 Emphysema, unspecified: Secondary | ICD-10-CM | POA: Diagnosis not present

## 2023-07-21 MED ORDER — SPIRIVA RESPIMAT 1.25 MCG/ACT IN AERS: 2.0000 | INHALATION_SPRAY | Freq: Every day | RESPIRATORY_TRACT | 3 refills | Status: AC

## 2023-07-21 MED ORDER — SPIRIVA RESPIMAT 2.5 MCG/ACT IN AERS: 2.0000 | INHALATION_SPRAY | Freq: Every day | RESPIRATORY_TRACT | 0 refills | Status: AC

## 2023-07-21 NOTE — Progress Notes (Signed)
 Synopsis: Referred in by Annitta Kindler, MD   Subjective:   PATIENT ID: Edwin Jones GENDER: male DOB: 17-Apr-1940, MRN: [id number]  Chief Complaint  Patient presents with   Follow-up    Cough. No SOB or wheezing.     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  He underwent a CT chest at the [place] in October which showed mediastinal lymphadenopathy this prompted a PET scan that showed increased FDG avidity at station 4R.   He does report worsening cough for a year with worsening mucus production and does report some degree of worsening shortness of breath.  Although he is sedentary most of the time given history of multiple CVA.  07/21/2023 - He was admitted with septic shock , CAP and hypoxic resp failure requiring intubation and mechanical ventilation and successfully extubated. Currently still recovering, remains very weak.   Family history - lung cancer in the family but unable to recall who.   SH - Quit smoking 15 to 20 years ago. Unable to recall specific smoking history.   ROS All systems were reviewed and are negative except for the above.  Objective:   Vitals:   07/21/23 1338  BP: (!) 108/58  Pulse: 91  Temp: (!) 97.1 F (36.2 C)  SpO2: 93%  Weight: 202 lb (91.6 kg)  Height: 6\' 3"  (1.905 m)   93% on RA BMI Readings from Last 3 Encounters:  07/21/23 25.25 kg/m  07/07/23 25.35 kg/m  06/16/23 26.25 kg/m   Wt Readings from Last 3 Encounters:  07/21/23 202 lb (91.6 kg)  07/07/23 202 lb 13.2 oz (92 kg)  06/16/23 210 lb (95.3 kg)    Physical Exam GEN: NAD, chronically ill  HEENT: Supple Neck, Reactive Pupils, EOMI  CVS: Normal S1, Normal S2, RRR, No murmurs or ES appreciated  Lungs: diffuse ronchi with poor air movement. Abdomen: Soft, non tender, non  distended, + BS  Extremities: Warm and well perfused, No edema   Labs and imaging were reviewed.   Ancillary Information   CBC    Component Value Date/Time   WBC 9.5 07/06/2023 0530   RBC 2.94 (L) 07/06/2023 0530   HGB 9.2 (L) 07/07/2023 0419   HCT 26.9 (L) 07/06/2023 0530   PLT 459 (H) 07/06/2023 0530   MCV 91.5 07/06/2023 0530   MCH 31.0 07/06/2023 0530   MCHC 33.8 07/06/2023 0530   RDW 14.6 07/06/2023 0530   LYMPHSABS 1.2 07/02/2023 0515   MONOABS 1.0 07/02/2023 0515   EOSABS 0.2 07/02/2023 0515   BASOSABS 0.0 07/02/2023 0515        No data to display           Assessment & Plan:  Mr. 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   []  PFTs (Cancerl, unable to do given significant weakness.  []  Start Spiriva 2puffs daily  []  Albuterol  as needed.   #Hx of Small Cell lung cancer in 2018 s/p chemo radiation #Suspecting recurrence with enlargen 4R LN and increased FDG avidity.  Discussed patient CT chest findings with enlarged mediastinal LAN and concern for recurrent cancer. Discussed our options including pursuing EBUS w/ TBNA however patient appears extremely weak and would not tolerate treatment. We discussed our second option which is not to pursue any diagnostic measures which appears to align with patient's wishes. We will hold off on any intervention for now and have him follow up with me closely in 4 weeks.   He does have a pressure ulcer on his heel from his hospitalization, I will have him see podiatry for further evaluation.  Return in about 4 weeks (around 08/18/2023).  I spent 30 minutes caring for this  patient today, including preparing to see the patient, obtaining a medical history , reviewing a separately obtained history, performing a medically appropriate examination and/or evaluation, counseling and educating the patient/family/caregiver, ordering medications, tests, or procedures, documenting clinical information in the electronic health record, and independently interpreting results (not separately reported/billed) and communicating results to the patient/family/caregiver  Annitta Kindler, MD [facility] 07/21/2023 2:30 PM

## 2023-08-25 ENCOUNTER — Ambulatory Visit (INDEPENDENT_AMBULATORY_CARE_PROVIDER_SITE_OTHER): Payer: Self-pay | Admitting: Pulmonary Disease

## 2023-08-25 ENCOUNTER — Encounter: Payer: Self-pay | Admitting: Pulmonary Disease

## 2023-08-25 VITALS — BP 120/80 | HR 103 | Temp 96.9°F | Ht 75.0 in | Wt 202.0 lb

## 2023-08-25 DIAGNOSIS — R590 Localized enlarged lymph nodes: Secondary | ICD-10-CM

## 2023-08-28 NOTE — Progress Notes (Signed)
 Synopsis: Referred in by Buren Rock HERO, MD   Subjective:   PATIENT ID: Edwin Jones GENDER: male DOB: 03/23/1940, MRN: [id number]  Chief Complaint  Patient presents with   Follow-up    Cough and wheezing are better.     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  He underwent a CT chest at the [place] in October which showed mediastinal lymphadenopathy this prompted a PET scan that showed increased FDG avidity at station 4R.   He does report worsening cough for a year with worsening mucus production and does report some degree of worsening shortness of breath.  Although he is sedentary most of the time given history of multiple CVA.  07/21/2023 - He was admitted with septic shock , CAP and hypoxic resp failure requiring intubation and mechanical ventilation and successfully extubated. Currently still recovering, remains very weak.   OV 08/28/2023 - Edwin Jones continues to recover from his recent episode of septic shock secondary to pneumonia. His strength has improved but he appears to remain significantly weak. I discussed with his wife that I am happy to proceed with a diagnostic EBUS regarding his possible recurrent Small cell lung cancer however given his tenuous physical condition we have elected to give him some time and repeat a CT chest in 2 months and decided at this time.   Family history - lung cancer in the family but unable to recall who.   SH - Quit smoking 15 to 20 years ago. Unable to recall specific smoking history.   ROS All systems were reviewed and are negative except for the above.  Objective:   Vitals:   08/25/23 1310  BP: 120/80  Pulse: (!) 103  Temp: (!) 96.9 F (36.1 C)  SpO2: 96%  Weight: 202 lb (91.6 kg)  Height: 6' 3 (1.905 m)   96% on RA BMI Readings from  Last 3 Encounters:  08/25/23 25.25 kg/m  07/21/23 25.25 kg/m  07/07/23 25.35 kg/m   Wt Readings from Last 3 Encounters:  08/25/23 202 lb (91.6 kg)  07/21/23 202 lb (91.6 kg)  07/07/23 202 lb 13.2 oz (92 kg)    Physical Exam GEN: NAD, chronically ill  HEENT: Supple Neck, Reactive Pupils, EOMI  CVS: Normal S1, Normal S2, RRR, No murmurs or ES appreciated  Lungs: diffuse ronchi with poor air movement. Abdomen: Soft, non tender, non distended, + BS  Extremities: Warm and well perfused, No edema   Labs and imaging were reviewed.   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   []  c/w Spiriva  2puffs daily  []  Albuterol  as needed.   #Hx of Small Cell lung cancer in 2018 s/p chemo radiation #Suspecting recurrence with enlargen 4R LN and increased FDG avidity.   Discussed patient CT chest findings with enlarged mediastinal LAN and concern for recurrent cancer. Discussed our options including pursuing EBUS w/ TBNA however patient appears extremely weak and would not tolerate treatment. We discussed our second option which is not  to pursue any diagnostic measures which appears to align with patient's wishes. We will hold off on any intervention for now and have him follow up with me closely 8 weeks with a repeat CT chest.   Return in about 2 months (around 10/25/2023) for with the CT chest.  I spent 20 minutes caring for this patient today, including preparing to see the patient, obtaining a medical history , reviewing a separately obtained history, performing a medically appropriate examination and/or evaluation, counseling and educating the patient/family/caregiver, ordering medications, tests, or procedures, documenting clinical information in the electronic health record, and independently interpreting results (not separately reported/billed) and communicating results to the patient/family/caregiver  Darrin Barn, MD [facility] 08/28/2023 3:30 PM

## 2023-09-15 ENCOUNTER — Encounter: Payer: Self-pay | Admitting: Pulmonary Disease

## 2023-11-18 ENCOUNTER — Ambulatory Visit: Admitting: Pulmonary Disease

## 2023-11-18 ENCOUNTER — Encounter: Payer: Self-pay | Admitting: Pulmonary Disease

## 2023-11-18 VITALS — BP 110/60 | HR 49 | Temp 97.1°F | Ht 75.0 in | Wt 202.0 lb

## 2023-11-18 DIAGNOSIS — Z85118 Personal history of other malignant neoplasm of bronchus and lung: Secondary | ICD-10-CM

## 2023-11-18 DIAGNOSIS — R918 Other nonspecific abnormal finding of lung field: Secondary | ICD-10-CM | POA: Diagnosis not present

## 2023-11-18 DIAGNOSIS — Z87891 Personal history of nicotine dependence: Secondary | ICD-10-CM | POA: Diagnosis not present

## 2023-11-18 DIAGNOSIS — C3490 Malignant neoplasm of unspecified part of unspecified bronchus or lung: Secondary | ICD-10-CM

## 2023-11-18 NOTE — Progress Notes (Signed)
 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  He underwent a CT chest at the [place] in October which showed mediastinal lymphadenopathy this prompted a PET scan that showed increased FDG avidity at station 4R.   He does report worsening cough for a year with worsening mucus production and does report some degree of worsening shortness of breath.  Although he is sedentary most of the time given history of multiple CVA.  07/21/2023 - He was admitted with septic shock , CAP and hypoxic resp failure requiring intubation and mechanical ventilation and successfully extubated. Currently still recovering, remains very weak.   OV 08/28/2023 - Edwin Jones continues to recover from his recent episode of septic shock secondary to pneumonia. His strength has improved but he appears to remain significantly weak. I discussed with his wife that I am happy to proceed with a diagnostic EBUS regarding his possible recurrent Small cell lung cancer however given his tenuous physical condition we have elected to give him some time and repeat a CT chest in 2 months and decided at this time.   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  He is certainly physically stronger.  However he remained quite frail.  We would plan on repeating a CT scan of the chest to evaluate for any progression of his also declined underlying small cell lung cancer and at which point he will make a decision on diagnostic approach.  I will see him back in 8 weeks to evaluate for possible bronchoscopy.  Family history - lung  cancer in the family but unable to recall who.   SH - Quit smoking 15 to 20 years ago. Unable to recall specific smoking history.   ROS All systems were reviewed and are negative except for the above.  Objective:   Vitals:   11/18/23 1308  BP: 110/60  Pulse: (!) 49  Temp: (!) 97.1 F (36.2 C)  SpO2: 97%  Weight: 202 lb (91.6 kg)  Height: 6' 3 (1.905 m)   97% on RA BMI Readings from Last 3 Encounters:  11/18/23 25.25 kg/m  08/25/23 25.25 kg/m  07/21/23 25.25 kg/m   Wt Readings from Last 3 Encounters:  11/18/23 202 lb (91.6 kg)  08/25/23 202 lb (91.6 kg)  07/21/23 202 lb (91.6 kg)    Physical Exam GEN: NAD, chronically ill  HEENT: Supple Neck, Reactive Pupils, EOMI  CVS: Normal S1, Normal S2, RRR, No murmurs or ES appreciated  Lungs: diffuse ronchi with poor air movement. Abdomen: Soft, non tender, non distended, + BS  Extremities: Warm and well perfused, No edema   Labs and imaging were reviewed.   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   []  c/w Spiriva  2puffs daily  []  Albuterol  as needed.   #Hx of  Small Cell lung cancer in 2018 s/p chemo radiation #Suspecting recurrence with enlargen 4R LN and increased FDG avidity.   Discussed patient CT chest findings with enlarged mediastinal LAN and concern for recurrent cancer. Discussed our options including pursuing EBUS w/ TBNA however patient appears extremely weak and would not tolerate treatment. We discussed our second option which is not to pursue any diagnostic measures which appears to align with patient's wishes. We will hold off on any intervention for now and have him follow up with me closely 8 weeks with a repeat CT chest.   Return in about 8 weeks (around 01/13/2024).  I spent 20 minutes caring for this patient today, including preparing to see the patient, obtaining a medical history , reviewing a separately obtained history, performing a medically appropriate examination and/or evaluation, counseling and educating the patient/family/caregiver, ordering medications, tests, or procedures, documenting clinical information in the electronic health record, and independently interpreting results (not separately reported/billed) and communicating results to the patient/family/caregiver  Darrin Barn, MD  Pulmonary Critical Care 11/18/2023 1:31 PM

## 2023-12-09 ENCOUNTER — Ambulatory Visit
Admission: RE | Admit: 2023-12-09 | Discharge: 2023-12-09 | Disposition: A | Discharge status: Home / Self Care | Source: Ambulatory Visit | Attending: Pulmonary Disease | Admitting: Pulmonary Disease

## 2023-12-09 DIAGNOSIS — R590 Localized enlarged lymph nodes: Secondary | ICD-10-CM | POA: Diagnosis present

## 2023-12-30 ENCOUNTER — Ambulatory Visit: Payer: Self-pay | Admitting: Pulmonary Disease

## 2023-12-30 NOTE — Telephone Encounter (Unsigned)
 Copied from CRM [id number]. Topic: Appointments - Scheduling Inquiry for Clinic >> Dec 30, 2023  1:32 PM Rozanna MATSU wrote: Reason for CRM: pt spouse stated she wants to see about doing a phone visit instead of coming in the office.

## 2023-12-30 NOTE — Telephone Encounter (Signed)
 FYI Only or Action Required?: Action required by provider: request for appointment and update on patient condition. Patient would like 10/29 appointment converted to virtual  Patient is followed in Pulmonology for cancer, last seen on 11/18/2023 by Malka Domino, MD.  Called Nurse Triage reporting Vomiting.  Symptoms began several days ago.  Interventions attempted: Increased fluids/rest and Other:  .  Symptoms are: completely resolved.  Triage Disposition: Home Care  Patient/caregiver understands and will follow disposition?: Yes  Copied from CRM #[id number]. Topic: Clinical - Red Word Triage >> Dec 30, 2023  1:30 PM Rozanna MATSU wrote: [facility] that prompted transfer to Nurse Triage: pt spouse stated he is coughing and vomiting Reason for Disposition  MILD to MODERATE vomiting (e.g., 1 - 5 times / day)  Answer Assessment - Initial Assessment Questions Patient's wife would like appointment on 01/05/2024 converted to virtual appointment.  She does not want to bring her husband out for fear that the may get sick.  Patient's wife also wants to know the results of recent CT  Please see note of recent episode of vomiting   Patient's wife states that patient has vomited twice this week.  Does not think the vomiting was preceded by cough   1. VOMITING SEVERITY: How many times have you vomited in the past 24 hours?      zero 2. ONSET: When did the vomiting begin?      Vomited once on Monday 3. FLUIDS: What fluids or food have you vomited up today? Have you been able to keep any fluids down?     Patient continues to eat and drink for his wife for the past two days 4. ABDOMEN PAIN: Are your having any abdomen pain? If Yes : How bad is it and what does it feel like? (e.g., crampy, dull, intermittent, constant)      denies 5. DIARRHEA: Is there any diarrhea? If Yes, ask: How many times today?      denies 6. CONTACTS: Is there anyone else in the family with the same  symptoms?      denies 7. CAUSE: What do you think is causing your vomiting?     Wife thinks that it is related to eating heavy food, beef 8. HYDRATION STATUS: Any signs of dehydration? (e.g., dry mouth [not only dry lips], too weak to stand) When did you last urinate?     No s/s dehydration but challenging to encourage him to drink right now. 9. OTHER SYMPTOMS: Do you have any other symptoms? (e.g., fever, headache, vertigo, vomiting blood or coffee grounds, recent head injury)     N/a 10. PREGNANCY: Is there any chance you are pregnant? When was your last menstrual period?       N/a  Protocols used: Vomiting-A-AH

## 2023-12-30 NOTE — Telephone Encounter (Signed)
 Spoke with Pt's wife per DPR and she would like to have a virtual visit to review pt's symptoms and discuss his recent bout of vomiting and cough.  @Melissa Wright: we've been unsuccessful with being able to get the appointment to generate as a virtual after going through the decision tree under Katie. Not sure why.

## 2023-12-31 ENCOUNTER — Telehealth: Payer: Self-pay

## 2023-12-31 NOTE — Telephone Encounter (Signed)
 Copied to sign encounter  Spoke with Pt's wife and told her due to government shut down we are unable to schedule her for a virtual visit.    She is refusing to bring him into clinic to be seen by Izetta Rouleau, NP or any other provider due to him being sick. Has not yet tested him for Covid or Flu.    Wants to know what is wrong with him since his eyes are red, he has been vomiting, and coughing. I urged her to please test him at home for the Covid/Flu test and she said she would.    She is adamant about speaking with Dr. Malka to know what's going on is it his cancer, is he just sick, I don't know and I want to know what's going on. She also said the [place] was supposed to be sending out a home health nurse, but she may turn her away since he's sick and doesn't want to risk the exposure.

## 2024-01-05 ENCOUNTER — Encounter: Admitting: Nurse Practitioner

## 2024-01-05 ENCOUNTER — Ambulatory Visit: Admitting: Pulmonary Disease

## 2024-01-05 NOTE — Telephone Encounter (Signed)
 Per secure chat with Dr. Malka- Just spoke to the patient's wife.  Nothing further needed.

## 2024-01-06 ENCOUNTER — Ambulatory Visit: Admitting: Pulmonary Disease

## 2024-01-13 ENCOUNTER — Ambulatory Visit: Admitting: Podiatry

## 2024-01-27 ENCOUNTER — Ambulatory Visit (INDEPENDENT_AMBULATORY_CARE_PROVIDER_SITE_OTHER): Admitting: Podiatry

## 2024-01-27 DIAGNOSIS — B351 Tinea unguium: Secondary | ICD-10-CM

## 2024-01-27 DIAGNOSIS — E1151 Type 2 diabetes mellitus with diabetic peripheral angiopathy without gangrene: Secondary | ICD-10-CM | POA: Diagnosis not present

## 2024-01-27 DIAGNOSIS — M79675 Pain in left toe(s): Secondary | ICD-10-CM | POA: Diagnosis not present

## 2024-01-27 DIAGNOSIS — M79674 Pain in right toe(s): Secondary | ICD-10-CM

## 2024-01-27 NOTE — Progress Notes (Signed)
  Subjective:  Patient ID: Edwin Jones, male    DOB: [birth year],  MRN: [id number]  Chief Complaint  Patient presents with   Nail Problem    Nail trim    83 y.o. male returns for the above complaint.  Patient presents with thickened onychodystrophy mycotic toenails x 10 mild pain on palpation arch with ambulation for pressure like to have debride down unable to do it himself denies any other acute complaints  Objective:  There were no vitals filed for this visit. Podiatric Exam: Vascular: dorsalis pedis and posterior tibial pulses are palpable bilateral. Capillary return is immediate. Temperature gradient is WNL. Skin turgor WNL  Sensorium: Normal Semmes Weinstein monofilament test. Normal tactile sensation bilaterally. Nail Exam: Pt has thick disfigured discolored nails with subungual debris noted bilateral entire nail hallux through fifth toenails.  Pain on palpation to the nails. Ulcer Exam: There is no evidence of ulcer or pre-ulcerative changes or infection. Orthopedic Exam: Muscle tone and strength are WNL. No limitations in general ROM. No crepitus or effusions noted.  Skin: No Porokeratosis. No infection or ulcers    Assessment & Plan:   1. Pain due to onychomycosis of toenails of both feet   2. Diabetes mellitus with peripheral angiopathy (HCC)     Patient was evaluated and treated and all questions answered.  Onychomycosis with pain  -Nails palliatively debrided as below. -Educated on self-care  Procedure: Nail Debridement Rationale: pain  Type of Debridement: manual, sharp debridement. Instrumentation: Nail nipper, rotary burr. Number of Nails: 10  Procedures and Treatment: Consent by patient was obtained for treatment procedures. The patient understood the discussion of treatment and procedures well. All questions were answered thoroughly reviewed. Debridement of mycotic and hypertrophic toenails, 1 through 5 bilateral and clearing of subungual debris. No  ulceration, no infection noted.  Return Visit-Office Procedure: Patient instructed to return to the office for a follow up visit 3 months for continued evaluation and treatment.  Franky Blanch, DPM    Return in about 3 months (around 04/28/2024).
